# Patient Record
Sex: Female | Born: 1940 | Race: Black or African American | Hispanic: No | Smoking: Never smoker
Health system: Southern US, Community
[De-identification: ages and names within clinical notes are randomized; demographics above are authoritative.]

## PROBLEM LIST (undated history)

## (undated) DIAGNOSIS — Z87898 Personal history of other specified conditions: Secondary | ICD-10-CM

## (undated) DIAGNOSIS — I10 Essential (primary) hypertension: Secondary | ICD-10-CM

## (undated) DIAGNOSIS — N183 Chronic kidney disease, stage 3 unspecified: Secondary | ICD-10-CM

## (undated) DIAGNOSIS — E785 Hyperlipidemia, unspecified: Secondary | ICD-10-CM

## (undated) DIAGNOSIS — Z8719 Personal history of other diseases of the digestive system: Secondary | ICD-10-CM

## (undated) DIAGNOSIS — Z860100 Personal history of colon polyps, unspecified: Secondary | ICD-10-CM

## (undated) DIAGNOSIS — Z9989 Dependence on other enabling machines and devices: Secondary | ICD-10-CM

## (undated) DIAGNOSIS — Z86718 Personal history of other venous thrombosis and embolism: Secondary | ICD-10-CM

## (undated) DIAGNOSIS — N289 Disorder of kidney and ureter, unspecified: Secondary | ICD-10-CM

## (undated) DIAGNOSIS — I5032 Chronic diastolic (congestive) heart failure: Secondary | ICD-10-CM

## (undated) DIAGNOSIS — N950 Postmenopausal bleeding: Secondary | ICD-10-CM

## (undated) DIAGNOSIS — M130 Polyarthritis, unspecified: Secondary | ICD-10-CM

## (undated) DIAGNOSIS — G4733 Obstructive sleep apnea (adult) (pediatric): Secondary | ICD-10-CM

## (undated) DIAGNOSIS — K219 Gastro-esophageal reflux disease without esophagitis: Secondary | ICD-10-CM

## (undated) DIAGNOSIS — D509 Iron deficiency anemia, unspecified: Secondary | ICD-10-CM

## (undated) DIAGNOSIS — K639 Disease of intestine, unspecified: Secondary | ICD-10-CM

## (undated) DIAGNOSIS — R51 Headache: Secondary | ICD-10-CM

## (undated) DIAGNOSIS — K5792 Diverticulitis of intestine, part unspecified, without perforation or abscess without bleeding: Secondary | ICD-10-CM

## (undated) DIAGNOSIS — I2510 Atherosclerotic heart disease of native coronary artery without angina pectoris: Secondary | ICD-10-CM

## (undated) DIAGNOSIS — D219 Benign neoplasm of connective and other soft tissue, unspecified: Secondary | ICD-10-CM

## (undated) DIAGNOSIS — G629 Polyneuropathy, unspecified: Secondary | ICD-10-CM

## (undated) DIAGNOSIS — E119 Type 2 diabetes mellitus without complications: Secondary | ICD-10-CM

## (undated) DIAGNOSIS — K632 Fistula of intestine: Secondary | ICD-10-CM

## (undated) DIAGNOSIS — G479 Sleep disorder, unspecified: Secondary | ICD-10-CM

## (undated) DIAGNOSIS — L732 Hidradenitis suppurativa: Secondary | ICD-10-CM

## (undated) HISTORY — DX: Personal history of other venous thrombosis and embolism: Z86.718

## (undated) HISTORY — PX: ENTEROCUTANEOUS FISTULA CLOSURE: SHX1510

## (undated) HISTORY — PX: ANTERIOR CERVICAL DECOMP/DISCECTOMY FUSION: SHX1161

## (undated) HISTORY — DX: Polyneuropathy, unspecified: G62.9

## (undated) HISTORY — PX: TUBAL LIGATION: SHX77

## (undated) HISTORY — PX: CARDIAC SURGERY: SHX584

## (undated) HISTORY — PX: AXILLARY HIDRADENITIS EXCISION: SUR522

## (undated) HISTORY — PX: KNEE ARTHROSCOPY: SHX127

## (undated) HISTORY — DX: Hidradenitis suppurativa: L73.2

## (undated) HISTORY — PX: BACK SURGERY: SHX140

## (undated) HISTORY — PX: CERVICAL LAMINECTOMY: SHX94

## (undated) HISTORY — PX: OTHER SURGICAL HISTORY: SHX169

## (undated) HISTORY — DX: Benign neoplasm of connective and other soft tissue, unspecified: D21.9

## (undated) HISTORY — DX: Atherosclerotic heart disease of native coronary artery without angina pectoris: I25.10

## (undated) HISTORY — PX: CHOLECYSTECTOMY: SHX55

## (undated) HISTORY — DX: Gastro-esophageal reflux disease without esophagitis: K21.9

## (undated) HISTORY — DX: Hyperlipidemia, unspecified: E78.5

## (undated) HISTORY — PX: CORONARY ANGIOPLASTY WITH STENT PLACEMENT: SHX49

## (undated) HISTORY — DX: Polyarthritis, unspecified: M13.0

## (undated) HISTORY — DX: Personal history of colonic polyps: Z86.010

## (undated) HISTORY — DX: Iron deficiency anemia, unspecified: D50.9

## (undated) HISTORY — PX: HERNIA REPAIR: SHX51

## (undated) HISTORY — DX: Personal history of colon polyps, unspecified: Z86.0100

## (undated) HISTORY — DX: Chronic diastolic (congestive) heart failure: I50.32

## (undated) HISTORY — PX: UMBILICAL HERNIA REPAIR: SHX196

---

## 2000-02-18 ENCOUNTER — Emergency Department (HOSPITAL_COMMUNITY): Admission: EM | Admit: 2000-02-18 | Discharge: 2000-02-18 | Payer: Self-pay | Admitting: Emergency Medicine

## 2000-07-27 ENCOUNTER — Encounter: Payer: Self-pay | Admitting: Orthopedic Surgery

## 2000-08-01 ENCOUNTER — Ambulatory Visit (HOSPITAL_COMMUNITY): Admission: RE | Admit: 2000-08-01 | Discharge: 2000-08-01 | Payer: Self-pay | Admitting: Orthopedic Surgery

## 2000-09-01 ENCOUNTER — Encounter: Admission: RE | Admit: 2000-09-01 | Discharge: 2000-09-20 | Payer: Self-pay | Admitting: Orthopedic Surgery

## 2001-02-09 ENCOUNTER — Emergency Department (HOSPITAL_COMMUNITY): Admission: EM | Admit: 2001-02-09 | Discharge: 2001-02-09 | Payer: Self-pay | Admitting: Emergency Medicine

## 2001-02-09 ENCOUNTER — Encounter: Payer: Self-pay | Admitting: Emergency Medicine

## 2002-07-29 ENCOUNTER — Ambulatory Visit (HOSPITAL_COMMUNITY): Admission: RE | Admit: 2002-07-29 | Discharge: 2002-07-29 | Payer: Self-pay | Admitting: Gastroenterology

## 2002-08-15 ENCOUNTER — Inpatient Hospital Stay (HOSPITAL_COMMUNITY): Admission: EM | Admit: 2002-08-15 | Discharge: 2002-08-30 | Payer: Self-pay | Admitting: Emergency Medicine

## 2002-08-15 ENCOUNTER — Encounter: Payer: Self-pay | Admitting: Surgery

## 2002-08-16 ENCOUNTER — Encounter: Payer: Self-pay | Admitting: General Surgery

## 2002-08-22 ENCOUNTER — Encounter: Payer: Self-pay | Admitting: Surgery

## 2002-08-22 ENCOUNTER — Encounter (INDEPENDENT_AMBULATORY_CARE_PROVIDER_SITE_OTHER): Payer: Self-pay | Admitting: Specialist

## 2002-09-12 HISTORY — PX: COLECTOMY: SHX59

## 2003-05-23 ENCOUNTER — Encounter: Payer: Self-pay | Admitting: Emergency Medicine

## 2003-05-23 ENCOUNTER — Emergency Department (HOSPITAL_COMMUNITY): Admission: EM | Admit: 2003-05-23 | Discharge: 2003-05-23 | Payer: Self-pay | Admitting: Emergency Medicine

## 2004-02-01 ENCOUNTER — Encounter: Payer: Self-pay | Admitting: Internal Medicine

## 2004-02-23 ENCOUNTER — Emergency Department (HOSPITAL_COMMUNITY): Admission: EM | Admit: 2004-02-23 | Discharge: 2004-02-23 | Payer: Self-pay | Admitting: Emergency Medicine

## 2004-03-09 ENCOUNTER — Encounter: Admission: RE | Admit: 2004-03-09 | Discharge: 2004-03-09 | Payer: Self-pay | Admitting: Internal Medicine

## 2004-03-19 ENCOUNTER — Encounter: Admission: RE | Admit: 2004-03-19 | Discharge: 2004-03-19 | Payer: Self-pay | Admitting: Internal Medicine

## 2004-04-06 ENCOUNTER — Inpatient Hospital Stay (HOSPITAL_BASED_OUTPATIENT_CLINIC_OR_DEPARTMENT_OTHER): Admission: RE | Admit: 2004-04-06 | Discharge: 2004-04-06 | Payer: Self-pay | Admitting: Cardiology

## 2004-05-13 ENCOUNTER — Ambulatory Visit: Payer: Self-pay | Admitting: Internal Medicine

## 2004-06-28 ENCOUNTER — Ambulatory Visit: Payer: Self-pay | Admitting: Internal Medicine

## 2004-06-30 ENCOUNTER — Ambulatory Visit (HOSPITAL_COMMUNITY): Admission: RE | Admit: 2004-06-30 | Discharge: 2004-06-30 | Payer: Self-pay | Admitting: Internal Medicine

## 2004-07-08 ENCOUNTER — Ambulatory Visit: Payer: Self-pay | Admitting: Internal Medicine

## 2005-01-09 ENCOUNTER — Emergency Department (HOSPITAL_COMMUNITY): Admission: EM | Admit: 2005-01-09 | Discharge: 2005-01-10 | Payer: Self-pay | Admitting: Family Medicine

## 2005-01-11 ENCOUNTER — Ambulatory Visit: Payer: Self-pay | Admitting: Internal Medicine

## 2005-01-20 ENCOUNTER — Ambulatory Visit: Payer: Self-pay | Admitting: Internal Medicine

## 2005-01-27 ENCOUNTER — Ambulatory Visit (HOSPITAL_BASED_OUTPATIENT_CLINIC_OR_DEPARTMENT_OTHER): Admission: RE | Admit: 2005-01-27 | Discharge: 2005-01-27 | Payer: Self-pay | Admitting: Internal Medicine

## 2005-01-31 ENCOUNTER — Ambulatory Visit: Payer: Self-pay | Admitting: Internal Medicine

## 2005-02-03 ENCOUNTER — Ambulatory Visit: Payer: Self-pay | Admitting: Internal Medicine

## 2005-04-07 ENCOUNTER — Encounter: Payer: Self-pay | Admitting: Internal Medicine

## 2005-04-07 ENCOUNTER — Ambulatory Visit (HOSPITAL_BASED_OUTPATIENT_CLINIC_OR_DEPARTMENT_OTHER): Admission: RE | Admit: 2005-04-07 | Discharge: 2005-04-07 | Payer: Self-pay | Admitting: *Deleted

## 2005-04-10 ENCOUNTER — Ambulatory Visit: Payer: Self-pay | Admitting: Internal Medicine

## 2005-04-24 ENCOUNTER — Emergency Department (HOSPITAL_COMMUNITY): Admission: EM | Admit: 2005-04-24 | Discharge: 2005-04-24 | Payer: Self-pay | Admitting: Family Medicine

## 2005-04-27 ENCOUNTER — Ambulatory Visit: Payer: Self-pay | Admitting: Internal Medicine

## 2005-05-25 ENCOUNTER — Ambulatory Visit: Payer: Self-pay | Admitting: Internal Medicine

## 2005-07-05 ENCOUNTER — Ambulatory Visit: Payer: Self-pay | Admitting: Internal Medicine

## 2005-07-05 ENCOUNTER — Inpatient Hospital Stay (HOSPITAL_COMMUNITY): Admission: EM | Admit: 2005-07-05 | Discharge: 2005-07-08 | Payer: Self-pay | Admitting: Emergency Medicine

## 2005-07-06 ENCOUNTER — Ambulatory Visit: Payer: Self-pay | Admitting: Cardiology

## 2005-07-06 ENCOUNTER — Encounter: Payer: Self-pay | Admitting: Cardiology

## 2006-03-27 ENCOUNTER — Encounter: Payer: Self-pay | Admitting: Internal Medicine

## 2006-04-03 ENCOUNTER — Encounter: Admission: RE | Admit: 2006-04-03 | Discharge: 2006-04-03 | Payer: Self-pay | Admitting: Gastroenterology

## 2006-04-18 ENCOUNTER — Inpatient Hospital Stay (HOSPITAL_COMMUNITY): Admission: RE | Admit: 2006-04-18 | Discharge: 2006-04-25 | Payer: Self-pay | Admitting: *Deleted

## 2006-04-21 ENCOUNTER — Encounter: Payer: Self-pay | Admitting: Vascular Surgery

## 2006-08-03 ENCOUNTER — Emergency Department (HOSPITAL_COMMUNITY): Admission: EM | Admit: 2006-08-03 | Discharge: 2006-08-04 | Payer: Self-pay | Admitting: Emergency Medicine

## 2006-08-15 DIAGNOSIS — Z91199 Patient's noncompliance with other medical treatment and regimen due to unspecified reason: Secondary | ICD-10-CM | POA: Insufficient documentation

## 2006-08-15 DIAGNOSIS — Z860100 Personal history of colon polyps, unspecified: Secondary | ICD-10-CM | POA: Insufficient documentation

## 2006-08-15 DIAGNOSIS — N183 Chronic kidney disease, stage 3 unspecified: Secondary | ICD-10-CM | POA: Insufficient documentation

## 2006-08-15 DIAGNOSIS — Z9089 Acquired absence of other organs: Secondary | ICD-10-CM

## 2006-08-15 DIAGNOSIS — L749 Eccrine sweat disorder, unspecified: Secondary | ICD-10-CM | POA: Insufficient documentation

## 2006-08-15 DIAGNOSIS — M109 Gout, unspecified: Secondary | ICD-10-CM | POA: Insufficient documentation

## 2006-08-15 DIAGNOSIS — K209 Esophagitis, unspecified without bleeding: Secondary | ICD-10-CM | POA: Insufficient documentation

## 2006-08-15 DIAGNOSIS — R109 Unspecified abdominal pain: Secondary | ICD-10-CM | POA: Insufficient documentation

## 2006-08-15 DIAGNOSIS — Z9889 Other specified postprocedural states: Secondary | ICD-10-CM

## 2006-08-15 DIAGNOSIS — I10 Essential (primary) hypertension: Secondary | ICD-10-CM

## 2006-08-15 DIAGNOSIS — Z789 Other specified health status: Secondary | ICD-10-CM | POA: Insufficient documentation

## 2006-08-15 DIAGNOSIS — Z9861 Coronary angioplasty status: Secondary | ICD-10-CM

## 2006-08-15 DIAGNOSIS — I2510 Atherosclerotic heart disease of native coronary artery without angina pectoris: Secondary | ICD-10-CM

## 2006-08-15 DIAGNOSIS — Z9119 Patient's noncompliance with other medical treatment and regimen: Secondary | ICD-10-CM

## 2006-08-15 DIAGNOSIS — Z86718 Personal history of other venous thrombosis and embolism: Secondary | ICD-10-CM | POA: Insufficient documentation

## 2006-08-15 DIAGNOSIS — E785 Hyperlipidemia, unspecified: Secondary | ICD-10-CM

## 2006-08-20 DIAGNOSIS — K219 Gastro-esophageal reflux disease without esophagitis: Secondary | ICD-10-CM | POA: Insufficient documentation

## 2007-12-18 ENCOUNTER — Encounter: Admission: RE | Admit: 2007-12-18 | Discharge: 2007-12-18 | Payer: Self-pay | Admitting: *Deleted

## 2007-12-20 ENCOUNTER — Ambulatory Visit (HOSPITAL_COMMUNITY): Admission: RE | Admit: 2007-12-20 | Discharge: 2007-12-21 | Payer: Self-pay | Admitting: *Deleted

## 2008-01-06 ENCOUNTER — Encounter: Payer: Self-pay | Admitting: Internal Medicine

## 2008-04-21 ENCOUNTER — Emergency Department (HOSPITAL_COMMUNITY): Admission: EM | Admit: 2008-04-21 | Discharge: 2008-04-21 | Payer: Self-pay | Admitting: Family Medicine

## 2008-04-22 ENCOUNTER — Inpatient Hospital Stay (HOSPITAL_COMMUNITY): Admission: EM | Admit: 2008-04-22 | Discharge: 2008-04-24 | Payer: Self-pay | Admitting: Emergency Medicine

## 2008-04-23 ENCOUNTER — Ambulatory Visit: Payer: Self-pay | Admitting: Vascular Surgery

## 2008-04-23 ENCOUNTER — Encounter (INDEPENDENT_AMBULATORY_CARE_PROVIDER_SITE_OTHER): Payer: Self-pay | Admitting: Cardiovascular Disease

## 2008-05-12 ENCOUNTER — Encounter: Payer: Self-pay | Admitting: Internal Medicine

## 2008-05-23 ENCOUNTER — Inpatient Hospital Stay (HOSPITAL_COMMUNITY): Admission: EM | Admit: 2008-05-23 | Discharge: 2008-05-28 | Payer: Self-pay | Admitting: Family Medicine

## 2008-05-30 ENCOUNTER — Encounter (INDEPENDENT_AMBULATORY_CARE_PROVIDER_SITE_OTHER): Payer: Self-pay | Admitting: Surgery

## 2008-05-30 ENCOUNTER — Inpatient Hospital Stay (HOSPITAL_COMMUNITY): Admission: EM | Admit: 2008-05-30 | Discharge: 2008-06-03 | Payer: Self-pay | Admitting: Emergency Medicine

## 2009-02-25 ENCOUNTER — Emergency Department (HOSPITAL_COMMUNITY): Admission: EM | Admit: 2009-02-25 | Discharge: 2009-02-25 | Payer: Self-pay | Admitting: Emergency Medicine

## 2009-03-05 LAB — HM COLONOSCOPY

## 2009-04-01 ENCOUNTER — Encounter: Payer: Self-pay | Admitting: Internal Medicine

## 2009-04-02 ENCOUNTER — Ambulatory Visit (HOSPITAL_COMMUNITY): Admission: RE | Admit: 2009-04-02 | Discharge: 2009-04-02 | Payer: Self-pay | Admitting: Infectious Diseases

## 2009-04-02 ENCOUNTER — Ambulatory Visit: Payer: Self-pay | Admitting: Internal Medicine

## 2009-04-02 DIAGNOSIS — G4733 Obstructive sleep apnea (adult) (pediatric): Secondary | ICD-10-CM

## 2009-04-02 DIAGNOSIS — M79609 Pain in unspecified limb: Secondary | ICD-10-CM

## 2009-04-03 ENCOUNTER — Ambulatory Visit: Payer: Self-pay | Admitting: Infectious Diseases

## 2009-04-03 ENCOUNTER — Encounter: Payer: Self-pay | Admitting: Internal Medicine

## 2009-04-03 LAB — CONVERTED CEMR LAB
Alkaline Phosphatase: 75 units/L (ref 39–117)
CO2: 17 meq/L — ABNORMAL LOW (ref 19–32)
Cholesterol: 164 mg/dL (ref 0–200)
Creatinine, Ser: 1.79 mg/dL — ABNORMAL HIGH (ref 0.40–1.20)
Creatinine, Urine: 101.5 mg/dL
Glucose, Bld: 76 mg/dL (ref 70–99)
HDL: 45 mg/dL (ref >39)
Hemoglobin: 10.3 g/dL — ABNORMAL LOW (ref 12.0–15.0)
LDL Cholesterol: 101 mg/dL — ABNORMAL HIGH (ref 0–99)
Microalb, Ur: 0.5 mg/dL (ref 0.00–1.89)
RBC: 4.73 M/uL (ref 3.87–5.11)
Sodium: 141 meq/L (ref 135–145)
Total Bilirubin: 0.2 mg/dL — ABNORMAL LOW (ref 0.3–1.2)
Total CHOL/HDL Ratio: 3.6
Total Protein: 7.3 g/dL (ref 6.0–8.3)
Triglycerides: 92 mg/dL (ref <150)
VLDL: 18 mg/dL (ref 0–40)
WBC: 6.4 10*3/uL (ref 4.0–10.5)

## 2009-04-09 ENCOUNTER — Ambulatory Visit: Payer: Self-pay | Admitting: Internal Medicine

## 2009-04-20 ENCOUNTER — Telehealth (INDEPENDENT_AMBULATORY_CARE_PROVIDER_SITE_OTHER): Payer: Self-pay | Admitting: Internal Medicine

## 2009-04-27 ENCOUNTER — Encounter: Payer: Self-pay | Admitting: Internal Medicine

## 2009-05-05 ENCOUNTER — Ambulatory Visit: Payer: Self-pay | Admitting: Internal Medicine

## 2009-05-05 DIAGNOSIS — D649 Anemia, unspecified: Secondary | ICD-10-CM | POA: Insufficient documentation

## 2009-05-07 DIAGNOSIS — E872 Acidosis: Secondary | ICD-10-CM | POA: Insufficient documentation

## 2009-05-07 DIAGNOSIS — M2550 Pain in unspecified joint: Secondary | ICD-10-CM

## 2009-05-07 LAB — CONVERTED CEMR LAB
Albumin: 4.4 g/dL (ref 3.5–5.2)
BUN: 23 mg/dL (ref 6–23)
Calcium: 10.2 mg/dL (ref 8.4–10.5)
Chloride: 110 meq/L (ref 96–112)
Creatinine, Ser: 1.34 mg/dL — ABNORMAL HIGH (ref 0.40–1.20)
HCT: 34.9 % — ABNORMAL LOW (ref 36.0–46.0)
Iron: 51 ug/dL (ref 42–145)
MCV: 71.6 fL — ABNORMAL LOW (ref >=78.0)
Phosphorus: 3 mg/dL (ref 2.3–4.6)
Platelets: 309 10*3/uL (ref 150–400)
RBC: 4.87 M/uL (ref 3.87–5.11)
Rhuematoid fact SerPl-aCnc: 22 intl units/mL — ABNORMAL HIGH (ref 0–20)
Saturation Ratios: 17 % — ABNORMAL LOW (ref 20–55)
WBC: 6.8 10*3/uL (ref 4.0–10.5)

## 2009-05-13 ENCOUNTER — Encounter: Payer: Self-pay | Admitting: Internal Medicine

## 2009-05-14 ENCOUNTER — Emergency Department (HOSPITAL_COMMUNITY): Admission: EM | Admit: 2009-05-14 | Discharge: 2009-05-14 | Payer: Self-pay | Admitting: Emergency Medicine

## 2009-06-09 ENCOUNTER — Telehealth (INDEPENDENT_AMBULATORY_CARE_PROVIDER_SITE_OTHER): Payer: Self-pay | Admitting: *Deleted

## 2009-06-12 ENCOUNTER — Encounter: Payer: Self-pay | Admitting: Internal Medicine

## 2009-07-07 ENCOUNTER — Ambulatory Visit: Payer: Self-pay | Admitting: Internal Medicine

## 2009-07-07 DIAGNOSIS — G589 Mononeuropathy, unspecified: Secondary | ICD-10-CM | POA: Insufficient documentation

## 2009-07-07 LAB — CONVERTED CEMR LAB
Hgb A1c MFr Bld: 5.1 %
TSH: 1.709 microintl units/mL (ref 0.350–4.5)

## 2009-07-08 ENCOUNTER — Telehealth: Payer: Self-pay | Admitting: *Deleted

## 2009-07-21 ENCOUNTER — Emergency Department (HOSPITAL_COMMUNITY): Admission: EM | Admit: 2009-07-21 | Discharge: 2009-07-21 | Payer: Self-pay | Admitting: Family Medicine

## 2009-07-28 ENCOUNTER — Encounter: Payer: Self-pay | Admitting: Internal Medicine

## 2009-08-10 ENCOUNTER — Ambulatory Visit: Payer: Self-pay | Admitting: Internal Medicine

## 2009-08-18 ENCOUNTER — Encounter (INDEPENDENT_AMBULATORY_CARE_PROVIDER_SITE_OTHER): Payer: Self-pay | Admitting: Dermatology

## 2009-08-18 ENCOUNTER — Encounter: Payer: Self-pay | Admitting: Internal Medicine

## 2009-09-02 ENCOUNTER — Encounter (INDEPENDENT_AMBULATORY_CARE_PROVIDER_SITE_OTHER): Payer: Self-pay | Admitting: Dermatology

## 2009-09-24 ENCOUNTER — Inpatient Hospital Stay (HOSPITAL_COMMUNITY): Admission: EM | Admit: 2009-09-24 | Discharge: 2009-09-26 | Payer: Self-pay | Admitting: Emergency Medicine

## 2009-09-24 ENCOUNTER — Encounter (INDEPENDENT_AMBULATORY_CARE_PROVIDER_SITE_OTHER): Payer: Self-pay | Admitting: Internal Medicine

## 2009-09-24 ENCOUNTER — Ambulatory Visit: Payer: Self-pay | Admitting: Internal Medicine

## 2009-09-26 ENCOUNTER — Encounter (INDEPENDENT_AMBULATORY_CARE_PROVIDER_SITE_OTHER): Payer: Self-pay | Admitting: Internal Medicine

## 2009-09-26 DIAGNOSIS — K922 Gastrointestinal hemorrhage, unspecified: Secondary | ICD-10-CM | POA: Insufficient documentation

## 2009-10-25 ENCOUNTER — Encounter: Payer: Self-pay | Admitting: Internal Medicine

## 2009-10-26 ENCOUNTER — Encounter: Payer: Self-pay | Admitting: Internal Medicine

## 2009-11-04 ENCOUNTER — Encounter: Payer: Self-pay | Admitting: Internal Medicine

## 2010-01-19 ENCOUNTER — Ambulatory Visit: Payer: Self-pay | Admitting: Infectious Disease

## 2010-01-19 ENCOUNTER — Encounter: Payer: Self-pay | Admitting: Internal Medicine

## 2010-01-19 ENCOUNTER — Inpatient Hospital Stay (HOSPITAL_COMMUNITY): Admission: AD | Admit: 2010-01-19 | Discharge: 2010-01-20 | Payer: Self-pay | Admitting: Internal Medicine

## 2010-01-19 ENCOUNTER — Ambulatory Visit: Payer: Self-pay | Admitting: Internal Medicine

## 2010-01-19 DIAGNOSIS — R42 Dizziness and giddiness: Secondary | ICD-10-CM | POA: Insufficient documentation

## 2010-01-19 DIAGNOSIS — IMO0002 Reserved for concepts with insufficient information to code with codable children: Secondary | ICD-10-CM

## 2010-01-19 DIAGNOSIS — R110 Nausea: Secondary | ICD-10-CM

## 2010-01-19 DIAGNOSIS — R079 Chest pain, unspecified: Secondary | ICD-10-CM | POA: Insufficient documentation

## 2010-01-19 DIAGNOSIS — N950 Postmenopausal bleeding: Secondary | ICD-10-CM

## 2010-01-20 ENCOUNTER — Encounter (INDEPENDENT_AMBULATORY_CARE_PROVIDER_SITE_OTHER): Payer: Self-pay | Admitting: Internal Medicine

## 2010-01-21 ENCOUNTER — Ambulatory Visit: Payer: Self-pay | Admitting: Obstetrics and Gynecology

## 2010-01-21 LAB — CONVERTED CEMR LAB
Hemoglobin: 11 g/dL — ABNORMAL LOW (ref 12.0–15.0)
INR: 1.06 (ref <1.50)
Platelets: 284 10*3/uL (ref 150–400)
RDW: 14.1 % (ref 11.5–15.5)
WBC: 9.7 10*3/uL (ref 4.0–10.5)
aPTT: 28 s (ref 24–37)

## 2010-01-26 ENCOUNTER — Ambulatory Visit (HOSPITAL_COMMUNITY): Admission: RE | Admit: 2010-01-26 | Discharge: 2010-01-26 | Payer: Self-pay | Admitting: Family Medicine

## 2010-02-24 ENCOUNTER — Other Ambulatory Visit: Admission: RE | Admit: 2010-02-24 | Discharge: 2010-02-24 | Payer: Self-pay | Admitting: Obstetrics & Gynecology

## 2010-02-24 ENCOUNTER — Ambulatory Visit: Payer: Self-pay | Admitting: Obstetrics & Gynecology

## 2010-02-24 LAB — CONVERTED CEMR LAB: TSH: 1.764 microintl units/mL (ref 0.350–4.500)

## 2010-03-04 ENCOUNTER — Encounter: Payer: Self-pay | Admitting: Internal Medicine

## 2010-03-31 ENCOUNTER — Ambulatory Visit: Payer: Self-pay | Admitting: Obstetrics and Gynecology

## 2010-04-18 ENCOUNTER — Emergency Department (HOSPITAL_COMMUNITY): Admission: EM | Admit: 2010-04-18 | Discharge: 2010-04-18 | Payer: Self-pay | Admitting: Emergency Medicine

## 2010-04-27 ENCOUNTER — Ambulatory Visit: Payer: Self-pay | Admitting: Obstetrics & Gynecology

## 2010-04-27 ENCOUNTER — Ambulatory Visit (HOSPITAL_COMMUNITY): Admission: RE | Admit: 2010-04-27 | Discharge: 2010-04-27 | Payer: Self-pay | Admitting: Obstetrics & Gynecology

## 2010-05-21 ENCOUNTER — Ambulatory Visit: Payer: Self-pay | Admitting: Obstetrics & Gynecology

## 2010-05-21 LAB — CONVERTED CEMR LAB: Yeast Wet Prep HPF POC: NONE SEEN

## 2010-10-12 NOTE — Miscellaneous (Signed)
Summary: hosp admission  INTERNAL MEDICINE ADMISSION HISTORY AND PHYSICAL PCP: Dr. Denton Meek  Attending: Dr. Orvan Falconer 1st contact: Dr. Arvilla Market [phone] 2nd contact: Dr. Polly Cobia [phone]  CC: Rectal bleeding  HPI:  Marissa [name] is a 70 yo F with a PMH significant for diverticulosis/itis c/b perforation requiring L hemicolectomy and CAD on Plavix following stent placement who presents with abd pain and rectal bleeding. She says she has had abd pain, worst just below the umbilicus, and nausea for the past week and noticed a small amount of dark red blood in her stool tonight at 11 pm. The abd pain is intermittent, with the nausea worsening after eating, and she says her symptoms are consistent with her past diverticulitis flares. However, her abd pain and bleeding are not as severe this time. She denies any fevers, chills, vomiting, diarrhea, dizziness, or any other concerning symptom.   ALLERGIES: NKDA  PAST MEDICAL HISTORY: Colonic polyps, hx of DVT, hx of Hyperlipidemia Hypertension Cardiomegaly Esophagitis Diverticulosis GERD Colonic polyp CAD s/p stent placement in 8/07, 4/09 Renal insufficiency with a baseline Cr approx 1.3  MEDICATIONS: ASPIRIN 81 MG TBEC (ASPIRIN) once daily PLAVIX 75 MG TABS (CLOPIDOGREL BISULFATE) Take one tablet by mouth daily NITROGLYCERIN 0.4 MG SUBL (NITROGLYCERIN) As needed for chest pain METOPROLOL TARTRATE 100 MG TABS (METOPROLOL TARTRATE) Take 1 tablet by mouth once a day MEDROL (PAK) 4 MG TABS (METHYLPREDNISOLONE) Use as directed for foot pain/gout CRESTOR 20 MG TABS (ROSUVASTATIN CALCIUM) Take 1 tab by mouth at bedtime GABAPENTIN 100 MG CAPS (GABAPENTIN) Take 1 tablet by mouth three times a day  SOCIAL HISTORY: 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  ROS: as per HPI VITALS: T: 97.9  P: 73  BP: 147/75  R: 20  O2SAT: 100%  ON: RA PHYSICAL EXAM: General:  alert, well-developed, and cooperative to examination.   Head:  normocephalic and atraumatic.   Eyes:  vision grossly intact, pupils equal, pupils round, pupils reactive to light, no injection and anicteric.   Mouth:  pharynx pink and moist, no erythema, and no exudates.   Lungs:  normal respiratory effort, no accessory muscle use, normal breath sounds, no crackles, and no wheezes. Heart:  normal rate, regular rhythm, no murmur, no gallop, and no rub.   Abdomen:  soft, normal bowel sounds, no distention; tender below the umbilicus, no rebound tenderness Msk:  no joint swelling, no joint warmth, and no redness over joints.   Extremities:  1+ pitting pedal edema Neurologic:  alert & oriented X3, nonfocal  Skin:  turgor normal and no rashes.   Psych:  Oriented X3, memory intact for recent and remote, normally interactive, good eye contact, not anxious appearing, and not depressed appearing.  LABS: TCO2                                     27                0-100            mmol/L  Ionized Calcium                          1.22  1.12-1.32        mmol/L  Hemoglobin (HGB)                         12.9              12.0-15.0        g/dL  Hematocrit (HCT)                         38.0              36.0-46.0        %  Sodium (NA)                              140               135-145          mEq/L  Potassium (K)                            5.6        h      3.5-5.1          mEq/L  Chloride                                 108               96-112           mEq/L  Glucose                                  90                70-99            mg/dL  BUN                                      31         h      6-23             mg/dL  Creatinine                               1.2               0.4-1.2          mg/dL  WBC                                      7.9                4.0-10.5         K/uL  RBC                                      5.07              3.87-5.11        MIL/uL  Hemoglobin (HGB)                         11.1       l      12.0-15.0        g/dL  Hematocrit (HCT)                         35.3       l      36.0-46.0        %  MCV                                      69.8       l      78.0-100.0       fL  MCHC                                     31.5              30.0-36.0        g/dL  RDW                                      15.1              11.5-15.5        %  Platelet Count (PLT)                     251               150-400          K/uL  Protime ( Prothrombin Time)              12.4              11.6-15.2        seconds  INR                                      0.93              0.00-1.49 PTT(a-Partial Thromboplastn Time)        28                24-37            seconds  Occult Blood, Fecal                      POSITIVE   ASSESSMENT AND PLAN: 70 yo F with h/o diverticular bleeds who presents with abd pain, nausea, and rectal bleeding that "feels consistent with her previous diverticular flares." Thus, diverticulosis/itis is the likely cause of her current complaints.  1. Rectal Bleeding, Likely 2/2 Diverticulosis - will admit to regular floor and hold off on GI consult until the AM as the patient is hemodynamically stable. She does not have fevers or an elevated WBC to suggest diverticulitis. Will recheck a CBC tomorrow afternoon. Will also check orthostatic vital signs and give IVF. Will continue Plavix but hold aspirin for now given her bleeding.  2. HTN- cont home  meds  3. CAD- cont Plavix, hold aspirin given her GI bleeding  4. HLD- cont statin  5. PPx- protonix, SCDs

## 2010-10-12 NOTE — Miscellaneous (Signed)
Summary: MEDICAL [facility] /SURGICAL   Imported By: Margie Billet 01/25/2010 15:27:33  _____________________________________________________________________  External Attachment:    Type:   Image     Comment:   External Document

## 2010-10-12 NOTE — Consult Note (Signed)
Summary: [facility]  [facility]   Imported By: Florinda Marker 09/21/2009 14:33:01  _____________________________________________________________________  External Attachment:    Type:   Image     Comment:   External Document

## 2010-10-12 NOTE — Letter (Signed)
Summary: [facility]: Heart Failure  [facility]: Heart Failure   Imported By: Florinda Marker 11/02/2009 14:14:34  _____________________________________________________________________  External Attachment:    Type:   Image     Comment:   External Document

## 2010-10-12 NOTE — Letter (Signed)
Summary: [facility]: Heart Failure  [facility]: Heart Failure   Imported By: Florinda Marker 10/27/2009 13:50:31  _____________________________________________________________________  External Attachment:    Type:   Image     Comment:   External Document

## 2010-10-12 NOTE — Assessment & Plan Note (Signed)
Summary: Hospital Admission  INTERNAL MEDICINE ADMISSION HISTORY AND PHYSICAL  PCP: Dr. [name]  CC: Chest pain and vaginal bleeding  HPI: Mrs. Mitchener is a postmenopausal 70 yo AAF with a PMH of CAD with MI in 2010(?) s/p multiple stents, a long history of lower GI bleeding, and admission to hospital in January of 2011 with hemorrhagic gastritis, lower GI bleeding and fibroid uterus, and hindranitis supportiva s/p sweat gland removal.  She presented in clinic today with chief complaints of axillary drainage from a boil and vaginal and rectal bleeding X2 weeks.  She also reported intermittant chest pain over the past 2 weeks.  Her symptoms first began about 2 weeks ago when she noticed sharp epigastric pain, watery diarrhea, and blood in her stool and anus.  She had a few bloody bowel movements, afterwhich the diarrhea persisted but the she ceased to notice the blood.  A few days later, she began to have abdominal cramping and vaginal bleeding with a flow "like a period," soaking several pads a day.  The heavy flow lasted 4 days, and for the past week she has only had spotting.  She stopped her plavix after a few days of active bleeding.  About a week ago, she experienced chest pain when at rest that felt similar to an MI that she had last year.  The pain was substernal, with pressure and tingling radiating from her chest to her jaw, neck, and down her left arm.  She experienced dyspnea, and both the pain and SOB were relieved with sitting up in bed.  Last night, she felt intermittant substernal chest pain without radiation.  About 2 weeks ago, she noticed drainage that she could not describe from a boil in her axilla with accompanying tingling and pain down her arm.  The tingling and arm pain resolved after I&D of the abscess in clinic.   Patient is postmenopausal.  Her menstrual debut was at age 68 with subsequent dysfunctional uterine bleeding of menorraghia and several months between periods.   She carried 6 pregnancies without difficulty, though she reports that she had regular periods during her pregnancies.  She reports that she was induced into menopause with a shot when she was 54 and has had no vaginal bleeding since that time.  She denies hormonal therapy use.  Patient reports subjective fevers and chills at night.  She has had anorexia, which she contributes partially to nausea (no vomiting) and to dysphagia (chronic).  She has had low energy, increased sleep requirements, dull headaches, lightheadedness with vertigo upon changing position, and numbness in her left leg over the past 2 weeks.  She reports chronic bilateral tinnitus and chronic diarrhea.  She denies changes in vision, weight loss, decreased hearing, or syncope.  She denies current chest pain, shortness of breath, or dizziness.  Walking is limited by chronic foot and ankle pain, but she reports no shortness of breath on exertion.  ALLERGIES: NKDA  PAST MEDICAL HISTORY: Lower GI bleeding, flexible colonoscopy in 09/2009, questionable small anal fissure  Hemorrhagic gastritis seen on endoscopy in 09/2009 Fibroid uterus, found on CT in 09/2009 Schatzki ring Hiatal hernia, likely cause of dysphagia Diverticulitis and colonic polyps with multiple episodes of bleeding requiring hospitalization CAD s/p multiple stents, last cath in 2009.  Echo in 2006 showed EF >60%. Hx of DVT, prior to 2007 Hyperlipidemia Hypertension GERD Renal insufficiency with a baseline Cr approx 1.3 Hidradenitis suppurativa s/p axilary sweat gland removal Chronic foot pain Polyarthritis Microcytic anemia, thought to  be thalassemia  PAST SURGICAL HISTORY: Cervical fusion C4-5 Cervical laminectomy Tonsillectomy Ventral hernia repair Axillary sweat gland removal Right knee arthroscopy Colectomy- partial Cholecystectomy Tubal ligation  MEDICATIONS: PLAVIX 75 MG TABS (CLOPIDOGREL BISULFATE) Take one tablet by mouth daily.  D/c'ed during  current bleeding episode METOPROLOL TARTRATE 100 MG TABS (METOPROLOL TARTRATE) Take 1 tablet by mouth once a day CRESTOR 20 MG TABS (ROSUVASTATIN CALCIUM) Take 1 tab by mouth at bedtime   SOCIAL HISTORY: Employment: Retired last year.  Used to work in a [employer]. 12th grade high school education Living situation: Widowed.  [id number] (1 still birth).  Lives with her daughter, son, and grandson. Substance use: denies tobacco use, but documented in chart.  Denies alcohol or recreation drugs. Insurance: Medicaid  FAMILY HISTORY Mother --DM, CAD, died in 18s from MI Sister- DM, CAD, died in 4s from MI Brother-DM, CAD, died in 23s from MI Brother-DM, living 5 Living children, alive and well No FMHx of cancers or psychiatric dz.   ROS:  As per HPI.  VITALS: T: 97.2 P: 72 BP:154/91  R: 20 O2SAT: 100% ON: RA  PHYSICAL EXAM: General:  alert, well-developed, and cooperative to examination.   Head:  normocephalic and atraumatic.   Eyes:  vision grossly intact, pupils equal, pupils round, pupils reactive to light, no injection and anicteric.  Hypervascularity of retina. Mouth:  pharynx pink and moist, no erythema, and no exudates.  Poor dentition. Neck:  supple, full ROM, no thyromegaly, JVD visible, no carotid bruits.   Lungs:  normal respiratory effort, no accessory muscle use, clear to ascultation, poor effort. Heart:  normal rate, regular rhythm, no murmur, no gallop, and no rub.   Abdomen:  soft, non-tender, normal bowel sounds, no distention, no guarding, no rebound tenderness.  Positive FOBT. Msk:  no joint swelling, no joint warmth  GU: No blood observed on external vaginal exam Pulses:  2+ DP/PT pulses bilaterally Extremities:  1+ pedal edema bilateral Neurologic:  alert & oriented X3, cranial nerves II-XII grossly intact, strength normal in all extremities, sensation intact to light touch, and walks slowly.   Skin:  turgor normal and no rashes.  Incision in left axilla  from I&D in clinic Psych:  Oriented X3, poor memory and difficulty answering concrete and abstract questions surrounding her medical history.  LABS:  Sodium (NA)                              138               135-145          mEq/L  Potassium (K)                            3.6               3.5-5.1          mEq/L  Chloride                                 106               96-112           mEq/L  CO2  28                19-32            mEq/L  Glucose                                  98                70-99            mg/dL  BUN                                      9                 6-23             mg/dL  Creatinine                               1.13              0.4-1.2          mg/dL  GFR, Est Non African American            48         l      >60              mL/min  GFR, Est African American                58         l      >60              mL/min    Oversized comment, see footnote  1  Calcium                                  9.4               8.4-10.5         mg/dL    WBC                                      8.5               4.0-10.5         K/uL  RBC                                      5.18       h      3.87-5.11        MIL/uL  Hemoglobin (HGB)                         11.3       l      12.0-15.0        g/dL  Hematocrit (HCT)                         36.1  36.0-46.0        %  MCV                                      69.8       l      78.0-100.0       fL  MCHC                                     31.3              30.0-36.0        g/dL  RDW                                      13.7              11.5-15.5        %  Platelet Count (PLT)                     SEE NOTE.         150-400          K/uL    PLATELET CLUMPS NOTED ON SMEAR, COUNT APPEARS ADEQUATE  Neutrophils, %                           86         h      43-77            %  Lymphocytes, %                           8          l      12-46            %  Monocytes, %                              4                 3-12             %  Eosinophils, %                           2                 0-5              %  Basophils, %                             0                 0-1              %  Neutrophils, Absolute                    7.3               1.7-7.7          K/uL  Lymphocytes,  Absolute                    0.7               0.7-4.0          K/uL  Monocytes, Absolute                      0.3               0.1-1.0          K/uL  Eosinophils, Absolute                    0.2               0.0-0.7          K/uL  Basophils, Absolute                      0.0               0.0-0.1          K/uL   Protime ( Prothrombin Time)              13.0              11.6-15.2        seconds  INR                                      0.99              0.00-1.49  PTT(a-Partial Thromboplastn Time)        28                24-37            seconds  Troponin I                               0.01              0.00-0.06        ng/mL Creatine Kinase, Total                   127               7-177            U/L  CK, MB                                   1.7               0.3-4.0          ng/mL  Relative Index                           1.3               0.0-2.5  Beta Natriuretic Peptide                 116.0      h      0.0-100.0        pg/mL  ASSESSMENT AND PLAN:  70 year old woman with recent chest pain and vaginal/GI bleeding.  Patient appears stable with normal vitals and non-alarming hematocrit.  Patient seems to have had stable angina with no chest pain in recent months, so her new chest pain may be unstable angina or recent MI.  In light of recent blood loss, a low circulating volume may put added strain on an already damaged heart.  However, her hemoglobin seems to be near her baseline at 11.3.  Her first set of enzymes were negative, which make an MI from last night unlikely.  CHF is unlikely without more complaints of edema or dyspnea.  She has had recurrent episodes of GI bleeding, thought to be  diverticulitis in the past, but she has never had a workup for malignancy. This is her first presentation of vaginal bleeding, which is concerning for malignancy in a postmenopausal women, particularly with the added history of lifelong dysfunctional uterine bleeding.  Subjective fevers, chills, and malaise may be attributed to her abscess, but it also could be a sign of neoplasm.  Another possibility is degenerating fibroids, as observed on CT in January.    1. Chest pain: admit to unit with telemetry.  Will cycle cardiac enzymes.  Will consult cardiologist, as her last cath was in 2009.  EKG pending.  Will check CBC and electrolytes in AM.  Will transfuse if hemoglobin drops below 10.  Will type and screen 2 units of PRC.  Will replace potassium, as it is below 4.  Blood culture X2.  Will make nitro available as needed.  2. Vaginal bleeding.  Will consult gynecology for endometrial biospy.  3. Rectal bleeding: recommend colonoscopy on outpatient basis.    4. Microcytic anemia: could be due to blood loss, but her hematocrit has not dropped.  Has had negative iron workup in past.  Consider electrophoresis for thalassemia.  5. Hypertension: continue her home med of metoprolol.  6. Hyperlipidemia: will continue home med of crestor.  7. GERD: will add protonix.  8. Axilla abscess: likely MRSA, so will start on empiric vancomycin and contact precautions until culture comes back.  9. Chronic renal insufficiency: stable, with Cr at 1.13  10. Prophylaxis: SCD, as she is actively bleeding.

## 2010-10-12 NOTE — Discharge Summary (Signed)
Summary: Hospital Discharge Update    Hospital Discharge Update:  Date of Admission: 01/19/2010 Date of Discharge: 01/20/2010  Brief Summary:  Ms. Palomarez was admitted for anginal-sounding chest pain and vaginal bleeding. She was ruled out for ACS but will be scheduled for a NM study as an outpatient. She has been having significant vaginal bleeding for the past 1 wk and some rectal bleeding 1 wk prior to that. Bimanual exam was negative for palpable masses. She will follow-up with ob/gyn on 5/12 @ 1:30 pm and with cardiology to get the NM stress test.  Other follow-up issues:  She will follow-up at [facility] on 5/12 @ 1:30 pm and with Dr. Polly Cobia on May 26 @ 10:20 am. At this f/u appt, please verify that she saw the ob/gyn and what they said and also make sure she has cardiology follow-up if she has not already seen them. Also, consider getting a repeat CBC depending on what is done at the ob/gyn's office as she was anemic during hospitalization.  Medication list changes:  Added new medication of DOXYCYCLINE HYCLATE 100 MG CAPS (DOXYCYCLINE HYCLATE) 1 tablet twice daily x 10 days - Signed Added new medication of ASPIR-LOW 81 MG TBEC (ASPIRIN) 1 tablet daily while holding plavix Removed medication of GABAPENTIN 100 MG CAPS (GABAPENTIN) Take 1 tablet by mouth three times a day Removed medication of PLAVIX 75 MG TABS (CLOPIDOGREL BISULFATE) Take one tablet by mouth daily Changed medication from NITROGLYCERIN 0.4 MG SUBL (NITROGLYCERIN) As needed for chest pain to NITROSTAT 0.4 MG SUBL (NITROGLYCERIN) place one pill under tongue every 5 mins as needed for chest pain to a max of 3 pills in 15 mins - Signed Rx of DOXYCYCLINE HYCLATE 100 MG CAPS (DOXYCYCLINE HYCLATE) 1 tablet twice daily x 10 days;  #20 x 0;  Signed;  Entered by: Silvestre Gunner MD;  Authorized by: Silvestre Gunner MD;  Method used: Electronically to [facility] [id number] [place],  [place], [place]  [address], Ph: [phone], Fax: [phone] Rx of NITROSTAT 0.4 MG SUBL (NITROGLYCERIN) place one pill under tongue every 5 mins as needed for chest pain to a max of 3 pills in 15 mins;  #30 x 3;  Signed;  Entered by: Silvestre Gunner MD;  Authorized by: Silvestre Gunner MD;  Method used: Handwritten  Discharge medications:  NITROSTAT 0.4 MG SUBL (NITROGLYCERIN) place one pill under tongue every 5 mins as needed for chest pain to a max of 3 pills in 15 mins METOPROLOL TARTRATE 100 MG TABS (METOPROLOL TARTRATE) Take 1 tablet by mouth once a day CRESTOR 20 MG TABS (ROSUVASTATIN CALCIUM) Take 1 tab by mouth at bedtime DOXYCYCLINE HYCLATE 100 MG CAPS (DOXYCYCLINE HYCLATE) 1 tablet twice daily x 10 days ASPIR-LOW 81 MG TBEC (ASPIRIN) 1 tablet daily while holding plavix  Other patient instructions:  You have an appointment at the [facility] on May 12 @ 1:30 pm (please arrive 15 minutes prior to appointment time). The clinics are located on the ground floor of [facility]. Please bring a picture ID, insurance card, and any applicable co-pay. You also have an appointment with Dr. Polly Cobia in the Outpatient Clinic on May 26 @ 10:20 am.  Please call the cardiologist as soon as possible to schedule an appointment with them. Please STOP taking your Plavix until you follow-up with the cardiologist. Take a baby aspirin daily instead. I have also prescribed for you an antibiotic called doxycycline. Take this as directed for 10  days.  Note: Hospital Discharge Medications & Other Instructions handout was printed, one copy for patient and a second copy to be placed in hospital chart.  Prescriptions: NITROSTAT 0.4 MG SUBL (NITROGLYCERIN) place one pill under tongue every 5 mins as needed for chest pain to a max of 3 pills in 15 mins  #30 x 3   Entered and Authorized by:   Silvestre Gunner MD   Signed by:   Silvestre Gunner MD on 01/20/2010   Method used:   Handwritten   RxID:    [id number] DOXYCYCLINE HYCLATE 100 MG CAPS (DOXYCYCLINE HYCLATE) 1 tablet twice daily x 10 days  #20 x 0   Entered and Authorized by:   Silvestre Gunner MD   Signed by:   Silvestre Gunner MD on 01/20/2010   Method used:   Electronically to        [facility] [address] (retail)       [phone]       [place], [place]  [address]       Ph: [phone]       Fax: [phone]   RxID:   [id number]   Appended Document: Hospital Discharge Update I forgot to mention to please schedule a colonoscopy for her at her follow-up appointment given that she has been having rectal bleeding and was FOBT + during hospitalization.

## 2010-10-12 NOTE — Discharge Summary (Signed)
  Hospital Discharge  Date of admission: 24 Sep 2009  Date of discharge:  26 Sep 2009  Brief reason for admission/active problems: 1. Abdominal pain, dark blood in stool. Normal upper endoscopy and flex sigmoidoscopy. (Patient did have Schatzki's ring, hemorrhagic gastritis on EGD). Blood seen in rectal skin but normal stool in the colon. Dr. Bosie Clos recommends repeat colonoscopy in 5 year or earlier if drop in Hgb.  2. Chest pain, likely musculoskeletal.   Followup needed:  1. Please check CBC 2. Please check for resolution of chest pain.   The medication and problem lists have been updated.  Please see the dictated discharge summary for details.       Updated Prior Medication List: PLAVIX 75 MG TABS (CLOPIDOGREL BISULFATE) Take one tablet by mouth daily NITROGLYCERIN 0.4 MG SUBL (NITROGLYCERIN) As needed for chest pain METOPROLOL TARTRATE 100 MG TABS (METOPROLOL TARTRATE) Take 1 tablet by mouth once a day CRESTOR 20 MG TABS (ROSUVASTATIN CALCIUM) Take 1 tab by mouth at bedtime GABAPENTIN 100 MG CAPS (GABAPENTIN) Take 1 tablet by mouth three times a day  Current Allergies: No known allergies          Patient Instructions: 1)  You will be called about the follow up appointment. Please give [place] a call on [phone] if you do not hear from [place] early next week.  2)  Your aspirin has been discontinued, no other changes in medicine at this time.

## 2010-10-12 NOTE — Consult Note (Signed)
Summary: [facility]  [facility]   Imported By: Florinda Marker 09/21/2009 14:20:30  _____________________________________________________________________  External Attachment:    Type:   Image     Comment:   External Document

## 2010-10-12 NOTE — Letter (Signed)
Summary: [facility]: [facility]  [facility]   Imported By: Florinda Marker 11/09/2009 16:38:28  _____________________________________________________________________  External Attachment:    Type:   Image     Comment:   External Document

## 2010-10-12 NOTE — Assessment & Plan Note (Signed)
Summary: vaginal breeding/gg  \  Vital Signs:  Patient profile:   70 year old female Menstrual status:  postmenopausal Weight:      223 pounds BSA:     2.05 O2 Sat:      100 % on Room air Temp:     97.2 degrees F oral Pulse rate:   72 / minute Resp:     20 per minute BP sitting:   154 / 91  (right arm)  Vitals Entered By: [name] RN (Jan 19, 2010 10:55 AM)  O2 Flow:  Room air CC: Pt c/o vaginal bleeding for 3 days with cramping. This is the first time since menopause.  Also drainage under left arm from old incision. Is Patient Diabetic? No Pain Assessment Patient in pain? yes     Location: arm Intensity: 3 Type: dull Onset of pain  2 weeks  Does patient need assistance? Functional Status Self care Ambulation Normal     Menstrual Status postmenopausal   Visit Type:  New Patient Primary Provider:  [name] MD  CC:  Pt c/o vaginal bleeding for 3 days with cramping. This is the first time since menopause.  Also drainage under left arm from old incision.Marissa [name]  History of Present Illness: 70 year old with CAD with hx of multiple stents placed by Dr. Jacinto Halim between 2005 and 2009 on plavix, with asymptomatic fibroids (seen on CT in 09/2009) who presented with acute onset of heavy postmenopausal bleeding 2 weeks ago began having bleeding using 3 pads a day. The bleeding slowed  down (after she stopped her plavix) and is now largely happening when she urinates. In the interim she also has begun to develop substernal chest pain that she describes as a tingling, pressure sensation in her chest with radiation to her jaw and neck, and left arm and tingling in the fingers of her left hand (this last piece of information was elicited after i had performed an I and D --see below). Chest pain is accompanied by some dyspnea though she has been dyspnic "all the time" for the past two weeks. It has lasted for a few minutes at a time and then subsided. Sitting upright in bed seems to  alleviate this. She also has been having increasing nausea and malaise over the past past several weeks. Finally  She also has had drainage of purulent  from area of axillary region where she had removal of glands for Hidradenitis suppurativa.   Problems Prior to Update: 1)  Gi Bleeding  (ICD-578.9) 2)  Foot Pain, Bilateral  (ICD-729.5) 3)  Neuropathy  (ICD-355.9) 4)  Metabolic Acidosis  (ICD-276.2) 5)  Polyarthritis  (ICD-719.49) 6)  Anemia  (ICD-285.9) 7)  Sleep Apnea, Obstructive  (ICD-327.23) 8)  Foot Pain, Left  (ICD-729.5) 9)  Renal Insufficiency  (ICD-588.9) 10)  Hx, Personal, Past Noncompliance  (ICD-V15.81) 11)  Cad  (ICD-414.00) 12)  Gerd  (ICD-530.81) 13)  Status, Condition Influencing Health Nec  (ICD-V49.89) 14)  Tubal Ligation, Hx of  (ICD-V26.51) 15)  Abdominal Pain  (ICD-789.00) 16)  Cholecystectomy, Hx of  (ICD-V45.79) 17)  Colectomy, Hx of  (ICD-V15.2) 18)  Esophagitis  (ICD-530.10) 19)  Arthroscopy, Right Knee, Hx of  (ICD-V45.89) 20)  Disorder, 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 Take One Tablet By Mouth Daily 2)  Nitroglycerin 0.4 Mg Subl (Nitroglycerin) .... As Needed For Chest Pain 3)  Metoprolol Tartrate 100 Mg Tabs (Metoprolol Tartrate) .... Take 1 Tablet By Mouth Once A Day 4)  Crestor 20 Mg Tabs (Rosuvastatin Calcium) .... Take 1 Tab By Mouth At Bedtime 5)  Gabapentin 100 Mg Caps (Gabapentin) .... Take 1 Tablet By Mouth Three Times A Day  Current Medications (verified): 1)  Plavix 75 Mg Tabs (Clopidogrel Bisulfate) .... Take One Tablet By Mouth Daily 2)  Nitroglycerin 0.4 Mg Subl (Nitroglycerin) .... As Needed For Chest Pain 3)  Metoprolol Tartrate 100 Mg Tabs (Metoprolol Tartrate) .... Take 1 Tablet By Mouth Once A Day 4)   Crestor 20 Mg Tabs (Rosuvastatin Calcium) .... Take 1 Tab By Mouth At Bedtime 5)  Gabapentin 100 Mg Caps (Gabapentin) .... Take 1 Tablet By Mouth Three Times A Day  Allergies (verified): No Known Drug Allergies  Past History:  Past Medical History: Colonic polyps, hx of DVT, hx of Gout Hyperlipidemia Hypertension Cardiomegaly Esophagitis Diverticulosis GERD Colonic polyp CAD s/p stent placement in 8/07 Renal insufficiency with a baseline Cr approx 1.3 Hidradenitis suppurativa sp axilary sweat gland removal  Family History: Reviewed history from 04/02/2009 and no changes required. Mother --DM, CAD Sister- DM, CAD No FMHx of cancers or psychiatric dz.  Social History: Reviewed history from 04/02/2009 and no changes required. 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  The patient denies fever, weight loss, weight gain, decreased hearing, hoarseness, syncope, peripheral edema, hemoptysis, abdominal pain, melena, hematochezia, severe indigestion/heartburn, hematuria, incontinence, genital sores, muscle weakness, transient blindness, depression, unusual weight change, and enlarged lymph nodes.    Physical Exam  General:  alert, well-developed, and overweight-appearing.   Head:  normocephalic, atraumatic, and no abnormalities observed.   Eyes:  vision grossly intact, pupils equal, pupils round, and pupils reactive to light.   Ears:  no external deformities.   Nose:  no external deformity, no external erythema, and no nasal discharge.   Mouth:  pharynx pink and moist, no erythema, and no exudates.     Neck:  supple and full ROM.   Chest Wall:  no deformities and no tenderness.   Lungs:  normal respiratory effort, no crackles, and no wheezes.   Heart:  normal rate, regular rhythm, no murmur, no gallop, and no rub.   Abdomen:  soft, non-tender, normal bowel sounds, and no distention.   Msk:  normal ROM.   Extremities:  1+ left pedal edema and 1+ right pedal edema.   Neurologic:  alert & oriented X3, strength normal in all extremities, and gait normal.   Skin:  left axilla with multiple areas of scarring Psych:  Oriented X3, memory intact for recent and remote, and dysphoric affect.     Impression & Recommendations:  Problem # 1:  CHEST PAIN (ICD-786.50) Assessment New Given her history of known CAD which has required multiple stents, chest pain substernally with numbness and tingling in her hands  in the setting of menorrhagia is quite concerning for possiblity of angina and possibly MI. The positional nature of the pain could make  pericarditis or pericardial effusion possibilities as well. Will get stat ekg, cardiac enzymes, bnp and admit her to telemetry. I am holding off on plavix for now as I would like to see how anemic she is. If her EKG is concernign will give her full dose aspirin and call cardiology. Orders: T-BNP  (B Natriuretic Peptide) [phone]) T-CK MB Fract ([id number]) CXR- 2view (CXR) T-Troponin I ([id number]) T-Troponin I ([id number]) New Patient Level V (27253)  Problem # 2:  MENORRHAGIA, POSTMENOPAUSAL (ICD-627.1) Assessment: New Could be due to fibroid, exacerbated by plavix. SHe has never had this before. She is going to need evalution by Gynecology for consideration of endocervical biopsy and or TV ultrasound Orders: T-CBC w/Diff ([id number]) New Patient Level V (59563)  Problem # 3:  ABSCESS, AXILLA, LEFT (ICD-682.3) I performed I and D and sent area for cutlure. MOst likely is MRSA. WOuld put on contact precautions, start her on vancomycin, fu  cultures and see how this responds to I and D Orders: T-Culture, Wound ([id number]) T-Basic Metabolic Panel ([id number]) New Patient Level V (18841) I&D Abscess, Complex (10061)  Problem # 4:  POSTURAL LIGHTHEADEDNESS (ICD-780.4)  Likely due to severe anemia from menorrhagia. Checking state cbc and will type and screen.  Orders: New Patient Level V (66063)  Problem # 5:  NAUSEA (ICD-787.02)  could be due to her infection in her axilla, could also be mainifestation of angina. will give her zofran  Orders: New Patient Level V (01601)  Complete Medication List: 1)  Plavix 75 Mg Tabs (Clopidogrel bisulfate) .... Take one tablet by mouth daily 2)  Nitroglycerin 0.4 Mg Subl (Nitroglycerin) .... As needed for chest pain 3)  Metoprolol Tartrate 100 Mg Tabs (Metoprolol tartrate) .... Take 1 tablet by mouth once a day 4)  Crestor 20 Mg Tabs (Rosuvastatin calcium) .... Take 1 tab by mouth at bedtime 5)  Gabapentin 100 Mg Caps (Gabapentin) .... Take 1 tablet by mouth three times a day  Process Orders Check Orders Results:     [facility]: Check successful Tests Sent for requisitioning (Jan 19, 2010 12:07 PM):     01/19/2010: [facility] -- T-Culture, Wound [[id number]] (signed)     01/19/2010: [facility] -- T-Basic Metabolic Panel [phone] (signed)     01/19/2010: [facility] -- T-CBC w/Diff [[id number]] (signed)     01/19/2010: [facility] -- T-BNP  (B Natriuretic Peptide) [phone] (signed)     01/19/2010: [facility] -- T-CK MB Fract [phone] (signed)     01/19/2010: [facility] -- T-Troponin I [phone] (signed)     01/19/2010: [facility] -- T-Troponin I [phone] (signed)  PROCEDURE:Informed consent obtained after risks and benefits explained. The patients left axilla was prepped in usual steril manner. ARea was sprayed  with topical anethestic, then infiltrated the skin with 1% lidocaine. After anethesia achieved incised area with scalpel with return of laregly bloody fluid which ws sent for culture. BLeeind was stopped with pressure bandage. EBL was minimal.

## 2010-10-12 NOTE — Cardiovascular Report (Signed)
[facility]   Imported By: Margie Billet 03/10/2010 14:53:45  _____________________________________________________________________  External Attachment:    Type:   Image     Comment:   External Document

## 2010-10-12 NOTE — Consult Note (Signed)
Summary: [facility]  [facility]   Imported By: Florinda Marker 10/15/2009 09:25:57  _____________________________________________________________________  External Attachment:    Type:   Image     Comment:   External Document

## 2010-11-26 LAB — CBC
HCT: 34.8 % — ABNORMAL LOW (ref 36.0–46.0)
MCH: 22 pg — ABNORMAL LOW (ref 26.0–34.0)
MCV: 69 fL — ABNORMAL LOW (ref 78.0–100.0)
RBC: 5.05 MIL/uL (ref 3.87–5.11)
WBC: 7.4 10*3/uL (ref 4.0–10.5)

## 2010-11-27 LAB — CBC
Hemoglobin: 11.1 g/dL — ABNORMAL LOW (ref 12.0–15.0)
Hemoglobin: 11.3 g/dL — ABNORMAL LOW (ref 12.0–15.0)
MCHC: 32.1 g/dL (ref 30.0–36.0)
MCHC: 32.3 g/dL (ref 30.0–36.0)
MCV: 69.3 fL — ABNORMAL LOW (ref 78.0–100.0)
Platelets: 173 10*3/uL (ref 150–400)
RBC: 4.68 MIL/uL (ref 3.87–5.11)
RBC: 5 MIL/uL (ref 3.87–5.11)
RBC: 5.07 MIL/uL (ref 3.87–5.11)
RDW: 15 % (ref 11.5–15.5)
WBC: 6.1 10*3/uL (ref 4.0–10.5)

## 2010-11-27 LAB — URINE MICROSCOPIC-ADD ON

## 2010-11-27 LAB — TYPE AND SCREEN: Antibody Screen: NEGATIVE

## 2010-11-27 LAB — D-DIMER, QUANTITATIVE: D-Dimer, Quant: 2.69 ug/mL-FEU — ABNORMAL HIGH (ref 0.00–0.48)

## 2010-11-27 LAB — COMPREHENSIVE METABOLIC PANEL
AST: 16 U/L (ref 0–37)
Albumin: 3.1 g/dL — ABNORMAL LOW (ref 3.5–5.2)
Chloride: 111 mEq/L (ref 96–112)
Creatinine, Ser: 1.23 mg/dL — ABNORMAL HIGH (ref 0.4–1.2)
GFR calc Af Amer: 53 mL/min — ABNORMAL LOW (ref >60)
Total Bilirubin: 0.3 mg/dL (ref 0.3–1.2)

## 2010-11-27 LAB — DIFFERENTIAL
Basophils Relative: 0 % (ref 0–1)
Eosinophils Relative: 2 % (ref 0–5)
Lymphocytes Relative: 13 % (ref 12–46)
Monocytes Relative: 8 % (ref 3–12)
Neutro Abs: 6.1 10*3/uL (ref 1.7–7.7)

## 2010-11-27 LAB — BASIC METABOLIC PANEL
BUN: 12 mg/dL (ref 6–23)
CO2: 23 mEq/L (ref 19–32)
Calcium: 9.1 mg/dL (ref 8.4–10.5)
Creatinine, Ser: 1.13 mg/dL (ref 0.4–1.2)
GFR calc Af Amer: 58 mL/min — ABNORMAL LOW (ref >60)
Glucose, Bld: 76 mg/dL (ref 70–99)

## 2010-11-27 LAB — POCT I-STAT, CHEM 8
Calcium, Ion: 1.22 mmol/L (ref 1.12–1.32)
Chloride: 108 mEq/L (ref 96–112)
HCT: 38 % (ref 36.0–46.0)
Potassium: 5.6 mEq/L — ABNORMAL HIGH (ref 3.5–5.1)
Sodium: 140 mEq/L (ref 135–145)

## 2010-11-27 LAB — PROTIME-INR: INR: 0.93 (ref 0.00–1.49)

## 2010-11-27 LAB — URINALYSIS, ROUTINE W REFLEX MICROSCOPIC
Bilirubin Urine: NEGATIVE
Specific Gravity, Urine: 1.015 (ref 1.005–1.030)
Urobilinogen, UA: 0.2 mg/dL (ref 0.0–1.0)

## 2010-11-27 LAB — APTT: aPTT: 28 seconds (ref 24–37)

## 2010-11-30 LAB — BASIC METABOLIC PANEL
BUN: 9 mg/dL (ref 6–23)
BUN: 9 mg/dL (ref 6–23)
CO2: 25 mEq/L (ref 19–32)
CO2: 28 mEq/L (ref 19–32)
Calcium: 9.4 mg/dL (ref 8.4–10.5)
Chloride: 106 mEq/L (ref 96–112)
Creatinine, Ser: 1.13 mg/dL (ref 0.4–1.2)
GFR calc Af Amer: 58 mL/min — ABNORMAL LOW (ref >60)
GFR calc non Af Amer: 46 mL/min — ABNORMAL LOW (ref >60)
Glucose, Bld: 93 mg/dL (ref 70–99)
Glucose, Bld: 98 mg/dL (ref 70–99)
Potassium: 3.2 mEq/L — ABNORMAL LOW (ref 3.5–5.1)
Sodium: 138 mEq/L (ref 135–145)

## 2010-11-30 LAB — TYPE AND SCREEN

## 2010-11-30 LAB — FACTOR 8 RISTOCETIN COFACTOR: Ristocetin Co-factor, Plasma: 249 % — ABNORMAL HIGH (ref 42–200)

## 2010-11-30 LAB — CBC
HCT: 30.1 % — ABNORMAL LOW (ref 36.0–46.0)
Hemoglobin: 9.5 g/dL — ABNORMAL LOW (ref 12.0–15.0)
MCHC: 31.3 g/dL (ref 30.0–36.0)
MCHC: 31.5 g/dL (ref 30.0–36.0)
MCV: 69.1 fL — ABNORMAL LOW (ref 78.0–100.0)
MCV: 69.8 fL — ABNORMAL LOW (ref 78.0–100.0)
Platelets: 199 10*3/uL (ref 150–400)
Platelets: ADEQUATE 10*3/uL (ref 150–400)
RDW: 13.7 % (ref 11.5–15.5)
RDW: 13.8 % (ref 11.5–15.5)
WBC: 8.5 10*3/uL (ref 4.0–10.5)

## 2010-11-30 LAB — CULTURE, BLOOD (ROUTINE X 2)
Culture: NO GROWTH
Culture: NO GROWTH

## 2010-11-30 LAB — WOUND CULTURE

## 2010-11-30 LAB — DIFFERENTIAL
Basophils Relative: 0 % (ref 0–1)
Eosinophils Absolute: 0.2 10*3/uL (ref 0.0–0.7)
Lymphs Abs: 0.7 10*3/uL (ref 0.7–4.0)
Monocytes Absolute: 0.3 10*3/uL (ref 0.1–1.0)
Neutro Abs: 7.3 10*3/uL (ref 1.7–7.7)

## 2010-11-30 LAB — RETICULOCYTES
RBC.: 4.78 MIL/uL (ref 3.87–5.11)
Retic Count, Absolute: 43 10*3/uL (ref 19.0–186.0)
Retic Ct Pct: 0.9 % (ref 0.4–3.1)

## 2010-11-30 LAB — FACTOR 8 ASSAY: Coagulation Factor VIII: 179 % — ABNORMAL HIGH (ref 73–140)

## 2010-11-30 LAB — VITAMIN B12: Vitamin B-12: 321 pg/mL (ref 211–911)

## 2010-11-30 LAB — VON WILLEBRAND ANTIGEN: Von Willebrand Antigen, Plasma: 283 % — ABNORMAL HIGH (ref 50–217)

## 2010-11-30 LAB — CARDIAC PANEL(CRET KIN+CKTOT+MB+TROPI)
CK, MB: 1.7 ng/mL (ref 0.3–4.0)
CK, MB: 2.2 ng/mL (ref 0.3–4.0)
Total CK: 123 U/L (ref 7–177)
Troponin I: 0.01 ng/mL (ref 0.00–0.06)

## 2010-11-30 LAB — IRON AND TIBC
Iron: 24 ug/dL — ABNORMAL LOW (ref 42–135)
UIBC: 188 ug/dL

## 2010-11-30 LAB — HEMOGLOBIN AND HEMATOCRIT, BLOOD: HCT: 30.7 % — ABNORMAL LOW (ref 36.0–46.0)

## 2010-11-30 LAB — PROTIME-INR: INR: 0.99 (ref 0.00–1.49)

## 2010-11-30 LAB — CK TOTAL AND CKMB (NOT AT ARMC): Total CK: 127 U/L (ref 7–177)

## 2010-11-30 LAB — FERRITIN: Ferritin: 218 ng/mL (ref 10–291)

## 2010-12-13 ENCOUNTER — Inpatient Hospital Stay (HOSPITAL_COMMUNITY)
Admission: EM | Admit: 2010-12-13 | Discharge: 2010-12-15 | DRG: 392 | Disposition: A | Discharge status: Home / Self Care | Payer: Medicare Other | Attending: Internal Medicine | Admitting: Internal Medicine

## 2010-12-13 DIAGNOSIS — I129 Hypertensive chronic kidney disease with stage 1 through stage 4 chronic kidney disease, or unspecified chronic kidney disease: Secondary | ICD-10-CM | POA: Diagnosis present

## 2010-12-13 DIAGNOSIS — K449 Diaphragmatic hernia without obstruction or gangrene: Secondary | ICD-10-CM | POA: Diagnosis present

## 2010-12-13 DIAGNOSIS — Z9119 Patient's noncompliance with other medical treatment and regimen: Secondary | ICD-10-CM

## 2010-12-13 DIAGNOSIS — R109 Unspecified abdominal pain: Secondary | ICD-10-CM | POA: Diagnosis present

## 2010-12-13 DIAGNOSIS — I2510 Atherosclerotic heart disease of native coronary artery without angina pectoris: Secondary | ICD-10-CM | POA: Diagnosis present

## 2010-12-13 DIAGNOSIS — N390 Urinary tract infection, site not specified: Secondary | ICD-10-CM | POA: Diagnosis present

## 2010-12-13 DIAGNOSIS — Z860100 Personal history of colon polyps, unspecified: Secondary | ICD-10-CM

## 2010-12-13 DIAGNOSIS — Z86718 Personal history of other venous thrombosis and embolism: Secondary | ICD-10-CM

## 2010-12-13 DIAGNOSIS — K209 Esophagitis, unspecified without bleeding: Secondary | ICD-10-CM | POA: Diagnosis present

## 2010-12-13 DIAGNOSIS — E785 Hyperlipidemia, unspecified: Secondary | ICD-10-CM | POA: Diagnosis present

## 2010-12-13 DIAGNOSIS — M109 Gout, unspecified: Secondary | ICD-10-CM | POA: Diagnosis present

## 2010-12-13 DIAGNOSIS — Z91199 Patient's noncompliance with other medical treatment and regimen due to unspecified reason: Secondary | ICD-10-CM

## 2010-12-13 DIAGNOSIS — R1310 Dysphagia, unspecified: Secondary | ICD-10-CM | POA: Diagnosis present

## 2010-12-13 DIAGNOSIS — N179 Acute kidney failure, unspecified: Secondary | ICD-10-CM | POA: Diagnosis present

## 2010-12-13 DIAGNOSIS — K219 Gastro-esophageal reflux disease without esophagitis: Principal | ICD-10-CM | POA: Diagnosis present

## 2010-12-13 DIAGNOSIS — Z9861 Coronary angioplasty status: Secondary | ICD-10-CM

## 2010-12-13 DIAGNOSIS — N189 Chronic kidney disease, unspecified: Secondary | ICD-10-CM | POA: Diagnosis present

## 2010-12-13 DIAGNOSIS — R197 Diarrhea, unspecified: Secondary | ICD-10-CM | POA: Diagnosis present

## 2010-12-13 HISTORY — DX: Disorder of kidney and ureter, unspecified: N28.9

## 2010-12-13 HISTORY — DX: Essential (primary) hypertension: I10

## 2010-12-13 LAB — CBC
HCT: 40.1 % (ref 36.0–46.0)
MCHC: 30.2 g/dL (ref 30.0–36.0)
Platelets: 201 10*3/uL (ref 150–400)
RDW: 14.6 % (ref 11.5–15.5)
WBC: 7.6 10*3/uL (ref 4.0–10.5)

## 2010-12-13 LAB — BASIC METABOLIC PANEL
Calcium: 9.6 mg/dL (ref 8.4–10.5)
GFR calc Af Amer: 49 mL/min — ABNORMAL LOW (ref >60)
GFR calc non Af Amer: 40 mL/min — ABNORMAL LOW (ref >60)
Potassium: 4 mEq/L (ref 3.5–5.1)
Sodium: 142 mEq/L (ref 135–145)

## 2010-12-13 LAB — DIFFERENTIAL
Basophils Absolute: 0 10*3/uL (ref 0.0–0.1)
Basophils Relative: 0 % (ref 0–1)
Eosinophils Absolute: 0.3 10*3/uL (ref 0.0–0.7)
Monocytes Relative: 10 % (ref 3–12)
Neutro Abs: 5.2 10*3/uL (ref 1.7–7.7)
Neutrophils Relative %: 68 % (ref 43–77)

## 2010-12-14 ENCOUNTER — Emergency Department (HOSPITAL_COMMUNITY): Payer: Medicare Other

## 2010-12-14 ENCOUNTER — Encounter (HOSPITAL_COMMUNITY): Payer: Self-pay | Admitting: Radiology

## 2010-12-14 DIAGNOSIS — R079 Chest pain, unspecified: Secondary | ICD-10-CM

## 2010-12-14 DIAGNOSIS — N898 Other specified noninflammatory disorders of vagina: Secondary | ICD-10-CM

## 2010-12-14 LAB — HEMOGLOBIN A1C
Hgb A1c MFr Bld: 6 % — ABNORMAL HIGH (ref <5.7)
Mean Plasma Glucose: 126 mg/dL — ABNORMAL HIGH (ref <117)

## 2010-12-14 LAB — TROPONIN I: Troponin I: 0.02 ng/mL (ref 0.00–0.06)

## 2010-12-14 LAB — CBC
HCT: 38.5 % (ref 36.0–46.0)
Hemoglobin: 11.9 g/dL — ABNORMAL LOW (ref 12.0–15.0)
MCH: 22 pg — ABNORMAL LOW (ref 26.0–34.0)
MCHC: 30.9 g/dL (ref 30.0–36.0)
MCV: 71 fL — ABNORMAL LOW (ref 78.0–100.0)

## 2010-12-14 LAB — CK TOTAL AND CKMB (NOT AT ARMC)
CK, MB: 2.2 ng/mL (ref 0.3–4.0)
CK, MB: 2.4 ng/mL (ref 0.3–4.0)
Relative Index: 1.4 (ref 0.0–2.5)
Relative Index: 1.4 (ref 0.0–2.5)
Total CK: 168 U/L (ref 7–177)

## 2010-12-14 LAB — URINALYSIS, ROUTINE W REFLEX MICROSCOPIC
Glucose, UA: NEGATIVE mg/dL
Ketones, ur: NEGATIVE mg/dL
Nitrite: NEGATIVE
Protein, ur: NEGATIVE mg/dL

## 2010-12-14 LAB — APTT: aPTT: 27 seconds (ref 24–37)

## 2010-12-14 LAB — BASIC METABOLIC PANEL
Calcium: 9.3 mg/dL (ref 8.4–10.5)
GFR calc non Af Amer: 44 mL/min — ABNORMAL LOW (ref >60)
Glucose, Bld: 99 mg/dL (ref 70–99)
Sodium: 136 mEq/L (ref 135–145)

## 2010-12-14 LAB — LIPID PANEL
Cholesterol: 146 mg/dL (ref 0–200)
Total CHOL/HDL Ratio: 2.5 RATIO
VLDL: 18 mg/dL (ref 0–40)

## 2010-12-14 LAB — CARDIAC PANEL(CRET KIN+CKTOT+MB+TROPI): Troponin I: <0.01 ng/mL (ref 0.00–0.06)

## 2010-12-14 LAB — D-DIMER, QUANTITATIVE: D-Dimer, Quant: 11.61 ug/mL-FEU — ABNORMAL HIGH (ref 0.00–0.48)

## 2010-12-14 LAB — PROTIME-INR
INR: 0.98 (ref 0.00–1.49)
Prothrombin Time: 13.2 seconds (ref 11.6–15.2)

## 2010-12-14 LAB — URINE MICROSCOPIC-ADD ON

## 2010-12-14 MED ORDER — IOHEXOL 300 MG/ML  SOLN
100.0000 mL | Freq: Once | INTRAMUSCULAR | Status: AC | PRN
  Administered 2010-12-14: 100 mL via INTRAVENOUS

## 2010-12-15 ENCOUNTER — Encounter: Payer: Self-pay | Admitting: Internal Medicine

## 2010-12-15 ENCOUNTER — Inpatient Hospital Stay (HOSPITAL_COMMUNITY): Payer: Medicare Other

## 2010-12-15 LAB — CBC
HCT: 39.6 % (ref 36.0–46.0)
Hemoglobin: 12.5 g/dL (ref 12.0–15.0)
RDW: 14.4 % (ref 11.5–15.5)
WBC: 11.7 10*3/uL — ABNORMAL HIGH (ref 4.0–10.5)

## 2010-12-15 LAB — BASIC METABOLIC PANEL
Calcium: 9.1 mg/dL (ref 8.4–10.5)
GFR calc non Af Amer: 30 mL/min — ABNORMAL LOW (ref >60)
Potassium: 4.5 mEq/L (ref 3.5–5.1)
Sodium: 137 mEq/L (ref 135–145)

## 2010-12-15 LAB — CARDIAC PANEL(CRET KIN+CKTOT+MB+TROPI)
CK, MB: 2.4 ng/mL (ref 0.3–4.0)
Relative Index: 1.5 (ref 0.0–2.5)

## 2010-12-15 NOTE — Discharge Summary (Signed)
Date of Admission: 12/13/2010     Date of Discharge: 12/15/2010     Brief Hospital Summary: Pt is a 70yo female with PMHX of CAD s/p stenting with intermittent Plavix usage, colonic polyps, diverticulosis, HTN who presented to [facility] for evaluation of chest pain that occurred 2 days prior to admission, abdominal pain on day prior to admission and loose stools x 1 week.  1) CP - During initial evaluation, 3rd set of cardiac enzymes showed elevation of troponins to 0.1, at which time cardiologist Dr. Jacinto Halim was consulted. 3 additional sets of cardiac enzymes were negative, and ACS ruled out. CP/ Abd pain thought 2/2 GI source, likely. Will have outpatient Cardiology f/u in 2-3 weeks. 2) Abdominal pain stabilized during hospital course, thought 2/2 GERD with hx of medium sized hiatal hernia, known esophagitis. Pt started on PPI, symptoms stable by time of discharge. 3) HTN - BP elevated during hospital course, was initially increased of lisinopril/ hctz and bp normalizede, but had acute increase in cr to 1.6, thought 2/2 over diuresis. Will be dc on outpt med, and consider escalation if indicated. 4) Acute on chronic renal failure on day of discharge, in setting of overdiuresis, pt fluid resuscitated and tolerating po by time of discharge. Will follow-up outpatient. 5) UTI - Pt found to have UTI during admission, treated with Cipro x 3 days, urine culture pending at time of dc.     Studies pending at time of DC: Urine culture and sensitivities.     Other follow-up issues: 1) BMET - assess renal function 2) F/U of urine culture/ sensitivities 3) Outpt follow-up with gyn - regarding vaginal bleeding (already follows with them) 4) Please make sure she followed-up with Dr. Jacinto Halim.     Medications on discharge: 1) Cipro 250mg  BID x 1 additional day 2) Omeprazole 40mg  qday 3) Crestor 40mg  qHS 4) Lisinopril/ HCTZ 20/12.5mg  qday 5) Metoprolol XL 100mg  qday 6) Nitroglycerin 0.4mg  SL every 5 minutes x max 3  doses as needed for chest pain.

## 2010-12-15 NOTE — H&P (Signed)
INTERNAL MEDICINE ADMISSION HISTORY AND PHYSICAL **Place in front of progress notes section of chart**  Attending: Dr. Aundria Rud 1st contact: Derrek Gu [phone] 2nd contact: Dr. Denton Meek [phone] Weekends, [place], After 5pm Weekdays: 1st contact: [phone] 2nd contact: [phone]  PCP: Dr. Denton Meek  CC: chest pain  HPI:70 yo woman with PMH of CAD s/p stent placement in 2007 (cath in 2009 revealed patent stents and EF 60%), colon polyps, diverticulosis, gout, DVT, HTN presents for chest pain that started the day prior to admission when she was sitting in church.  She describes the pain as sharp, 7/10 in severity associated with diaphoresis, lasting about several minutes, no alleviating or exacerbation factor.  She endorses SOB in which she uses 2-3 pillows at night chronically, chronic nausea and diarrhea with some blood in her stool but no vomiting.  She had about 5 episodes yesterday.  She also states that she had similar pain episode in the past when she had her heart attack. She also has numbness sensation in her hands and body.  She reports having vaginal bleeding/spotting x 1 year and was seen at [facility] and she was told that she had tumor but no treatment. She said that she stopped taking Plavix and aspirin whenever she has bleeding.        ALLERGIES: NKDA  PAST MEDICAL HISTORY: DVT, hx of Gout Hyperlipidemia Hypertension Cardiomegaly Esophagitis Diverticulosis GERD Hx GI bleed (follows with Dr. Bosie Clos)   -EGD 09/2009: 1. Medium-sized hiatal hernia - likely source of her dysphagia.   2. Widely patent distal esophageal ring, status post dilation without       success due to large diameter.   3. Focal area of hemorrhagic gastritis.   4. Suspect dysphagia due to hiatal hernia, not esophageal ring.  Colonoscopy 09/2009: 1. Small amount of bright red blood in perianal area without any       obvious source.   2. No blood in rectum.   3. Solid brown stool in rectum.   4.  Question of small fissure versus vaginal source of bleeding.  Colonic polyp Postmenopausal bleeding  - hysteroscopy with d/c in june 2011, august 2011 no endometrial atypia, dysplasia, or malignancy CAD   -s/p stent placement in 8/07  -followed with Dr. 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   ROS: as per HPI, all other systems reviewed and negative  VITALS: T: 98.1  P: 75  BP: 185/81  R: 18  O2SAT: 100%  ON: RA PHYSICAL EXAM: General:  alert, well-developed, NAD, cooperative, A&Ox3 Head:  normocephalic and atraumatic.   Eyes:  PERRLA, EOMI, vision grossly intact, conjuctive and sclerae within normal limits.   Mouth:  MMM, no erythema, no exudates,  or lesions.   Neck:  supple, full ROM, trachea midline, no palp masses, no JVD, no carotid bruits.   Lungs:  CTAB, normal respiratory effort  Heart: RRR, no M/R/G Abdomen:  soft, NT, ND, BS present and normoactive, no palpable masses  Msk:  no joint swelling, warmth, or erythema  Neurologic:  CN II-XII intact,+5 strength globally, sensation grossly intact.   Skin:  turgor normal and no rashes.   Psych: memory intact for recent and remote, normally  interactive, good eye contact, affect as expected GU: exam limited 2/2 habitus.  pelvic exam did not reveal any blood in the vaginal vault or abnormal mass.   LABS: WBC                                      7.6               4.0-10.5         K/uL  RBC                                      5.57       h      3.87-5.11        MIL/uL  Hemoglobin (HGB)                         12.1              12.0-15.0        g/dL  Hematocrit (HCT)                         40.1              36.0-46.0        %  MCV                                      72.0       l      78.0-100.0       fL  MCH -                                    21.7       l      26.0-34.0        pg  MCHC                                     30.2              30.0-36.0        g/dL  RDW                                      14.6              11.5-15.5        %  Platelet Count (PLT)  201               150-400          K/uL  Neutrophils, %                           68                43-77            %  Lymphocytes, %                           19                12-46            %  Monocytes, %                             10                3-12             %  Eosinophils, %                           3                 0-5              %  Basophils, %                             0                 0-1              %  Neutrophils, Absolute                    5.2               1.7-7.7          K/uL  Lymphocytes, Absolute                    1.4               0.7-4.0          K/uL  Monocytes, Absolute                      0.7               0.1-1.0          K/uL  Eosinophils, Absolute                    0.3               0.0-0.7          K/uL  Basophils, Absolute                      0.0               0.0-0.1          K/uL   Sodium (NA)  142               135-145          mEq/L  Potassium (K)                            4.0               3.5-5.1          mEq/L  Chloride                                 108                96-112           mEq/L  CO2                                      27                19-32            mEq/L  Glucose                                  87                70-99            mg/dL  BUN                                      14                6-23             mg/dL  Creatinine                               1.31       h      0.4-1.2          mg/dL  GFR, Est Non African American            40         l      >60              mL/min  GFR, Est African American                49         l      >60              mL/min    Oversized comment, see footnote  1  Calcium                                  9.6               8.4-10.5         mg/dL Protime ( Prothrombin Time)              13.2              11.6-15.2  seconds  INR                                      0.98              0.00-1.49\  PTT(a-Partial Thromboplastn Time)        27                24-37            seconds  D-Dimer, Fibrin Derivatives              11.61      h      0.00-0.48        ug/mL-FEU  Creatine Kinase, Total                   168               7-177            U/L  CK, MB                                   2.2               0.3-4.0          ng/mL  Relative Index                           1.3               0.0-2.5 Troponin I                               0.02              0.00-0.06        ng/mL  Color, Urine                             YELLOW            YELLOW  Appearance                               CLEAR             CLEAR  Specific Gravity                         1.017             1.005-1.030  pH                                       6.0               5.0-8.0  Urine Glucose                            NEGATIVE          NEG              mg/dL  Bilirubin  NEGATIVE          NEG  Ketones                                  NEGATIVE          NEG              mg/dL  Blood                                    NEGATIVE          NEG  Protein                                  NEGATIVE          NEG               mg/dL  Urobilinogen                             1.0               0.0-1.0          mg/dL  Nitrite                                  NEGATIVE          NEG  Leukocytes                               SMALL      a      NEG   Squamous Epithelial / LPF                RARE              RARE  WBC / HPF                                11-20             <3               WBC/hpf  RBC / HPF                                0-2               <3               RBC/hpf  Bacteria / HPF                           MANY       a      RARE  Urine-Other                              SEE NOTE.    MUCOUS PRESENT  CTA chest:  Findings:  There is good opacification of the central, lobar, and   segmental pulmonary arteries.  No focal filling defects.  No   evidence of significant pulmonary embolus.    Coronary artery calcification.  Calcification of nonaneurysmal   thoracic aorta.  No significant lymphadenopathy in the chest.  No   pleural or pericardial effusion or thickening.  Dependent   atelectasis in the lung bases.  Scattered emphysematous blebs. No   focal airspace disease or consolidation.  Degenerative changes in   the spine.  Thoracic kyphosis.  Surgical absence of the   gallbladder.  Small esophageal hiatal hernia.  Visualized portions   of the upper abdominal organs are otherwise unremarkable.  No   significant changes since the prior study.    Review of the MIP images confirms the above findings.    IMPRESSION:   No evidence of pulmonary embolus.   ASSESSMENT AND PLAN: (1)Chest pain.  Differential dx: GERD, musculoskeletal, or ACS.  Need to rule ACS given her risk factors: previous MI, HTN, and HLP. EKG is without evidence of ischemia or other concerning process.  D-dimer positive but CTA negative for PE, aortic dissection, PTX, or PNA.       -Admit to telemetry -Cycle cardiac enzymes x 3 -Pain control with Morphine and NTG SL as needed -Continue Crestor 20mg  by mouth once daily -Continue  Metoprolol XL 100mg   -Continue ASA -FLP, TSH, and A1c to risk stratify  (2)HTN:  Continue home medications   (3) Vaginal bleeding vs. rectal: Pt has an extensive hx of rectal vs vaginal bleeding.  She had EGD and colonoscopy in 2011 as described above.  She also follows with gyn and has had 2 hysteroscopies with d and c; endometrial bx negative for malignancy.  Vaginal exam did not demonstrate any bleeding or adnexal masses, no evidence of rectal bleeding on exam.  Her Hbg is stable and at baseline - Will check FOBT x3. - Pt may need repeat colonoscopy/ EGD as outpt - Will defer further management of her vaginal bleeding to her gyn  ()VTE PROPH: SCDs   Clinical Lists Changes     Signed by Nelda Bucks DO on 12/14/2010 at 7:07 AM  ________________________________________________________________________

## 2010-12-16 LAB — URINE CULTURE: Culture  Setup Time: 201204030936

## 2010-12-16 NOTE — Discharge Summary (Signed)
Marissa [name], Marissa [name]              ACCOUNT NO.:  [id number]  MEDICAL RECORD NO.:  [id number]           PATIENT TYPE:  I  LOCATION:  2502                         FACILITY:  [facility]  PHYSICIAN:  C. Ulyess Mort, M.D.DATE OF BIRTH:  04-Jun-1941  DATE OF ADMISSION:  12/13/2010 DATE OF DISCHARGE:  12/15/2010                              DISCHARGE SUMMARY   DISCHARGE DIAGNOSES: 1. Chest pain - likely secondary to gastrointestinal source such as     gastroesophageal reflux disease in the setting of known hiatal     hernia versus esophagitis.  Acute coronary syndrome ruled out with     negative cardiac enzymes and negative EKG.  Pulmonary embolism     ruled out with negative CTA. 2. Urinary tract infection. 3. Acute kidney injury - likely prerenal in the setting of over     diuresis.  The patient was volume repleted.  To be followed up at an     outpatient. 4. Hypertension. 5. Hyperlipidemia. 6. Abdominal pain with diarrhea - abdominal pain possibly secondary to gastroesophageal reflux disease versus diarrhea (unclear if acute or chronic because patient's history changed on repeat questioning.)  The patient remained afebrile during hospital  course. 7. History of coronary artery disease status post multiple stents.  DISCHARGE MEDICATIONS: 1. Ciprofloxacin 250 mg by mouth twice daily x1 day. 2. Omeprazole 40 mg by mouth once daily. 3. Crestor 20 mg by mouth once daily. 4. Lisinopril/HCTZ 20/12.5 mg 1 tablet by mouth once daily. 5. Metoprolol XL 100 mg 1 tablet by mouth once daily. 6. Nitroglycerin sublingual 0.4 mg, take 1 tablet every 5 minutes     times maximum of 3 doses as needed for chest pain.  DISPOSITION AND FOLLOWUP:  The patient is to follow up with the [facility] on January 07, 2011, at 1:15 p.m., at which time it is requested for BMET to be checked to assess renal function.  A urine culture and sensitivity should be followed up, which were  pending at the time of discharge.  Outpatient followup with gynecology regarding vaginal bleeding should also be established, the patient already has an outpatient gynecologist.  The patient's blood pressure should be evaluated to determine if further escalation of her home therapy is needed.  The patient is also to follow up with her cardiologist Dr. Jacinto Halim on December 28, 2010, at 2:30 p.m. for further followup and evaluation of the patient's cardiac status in consideration for stress test.  PROCEDURES PERFORMED:   1) CT angiogram (December 14, 2010) - no evidence of pulmonary embolism.  CONSULTATIONS:  Cristy Hilts. Jacinto Halim, MD of Cardiology  BRIEF ADMITTING HISTORY AND PHYSICAL:  The patient is a 70 year old female with a past medical history of coronary artery disease status post stent placement in 2007, colonic polyps, diverticulosis, hypertension, gout who presents to [facility] for chest pain that started 2 days prior to admission when the patient was sitting in church.  The pain was initially described as a sharp, 7/10 in severity with associated diaphoresis, which lasted several minutes and then spontaneously resolved.  No aggravating or alleviating factors appreciated.  The  patient endorses shortness of breath when she uses 2-3 pillows at night chronically, chronic nausea and diarrhea with some blood in her stool, but no vomiting.  Of note, the patient states that she has stop taking her Plavix and aspirin whenever she have vaginal bleeding for which she is being followed outpatient at [facility] hospital.  PHYSICAL EXAM ON ADMISSION:  VITAL SIGNS:  Temperature 98.1, pulse 75, blood pressure 185/81, respirations 18, O2 saturation 100% on room air. GENERAL:  Alert, well developed, no acute distress, cooperative. HEENT:  Mouth, moist mucous membranes.  No erythema, exudates, or lesions. NECK:  No carotid bruits.  No JVD.  Supple.  Full range of motion. LUNGS:  Clear to auscultation  bilaterally. HEART:  Regular rate and rhythm.  No murmurs, rubs, or gallops. ABDOMEN:  Soft, nontender, nondistended.  Bowel sounds present and normoactive.  No palpable masses. MUSCULOSKELETAL:  No joint swelling, warmth or erythema. NEUROLOGIC:  Alert and oriented x3.  Cranial nerves II through XII grossly intact. GU:  Exam is limited secondary to habitus.  Pelvic exam did not reveal any blood in the vaginal vault or abnormal masses.  LABORATORIES ON ADMISSION: 1. CBC:  WBC 7.6, hemoglobin 12.1, hematocrit 40.1, platelets 201. 2. BMET:  Sodium 142, potassium 4, chloride 108, CO2 27, glucose 87,     BUN 14, creatinine 1.31. 3. Coagulation studies:  PT is 13.2, INR 0.98, PTT 27. 4. D-dimer 11.6. 5. Cardiac enzymes:  CK total 168, CK-MB 2.2, troponin 0.02. 6. UA:  Negative for nitrites, protein blood per ketones, small     leukocytes, 11-20 wbc's, many bacteria.  HOSPITAL COURSE BY PROBLEM: 1. Chest pain.  Given the patient's known coronary artery disease with     medication noncompliance of her Plavix, acutely our initial concern     was to rule out ACS.  Cardiac enzymes were cycled and the third set     of cardiac enzymes did show a bump in troponin to 0.1, although no     acute EKG changes were noted at that time.  As a result, the     patient's primary cardiologist Dr. Jacinto Halim was consulted who provided     inpatient evaluation.  Cardiac enzymes were cycled again by 3 and     remaining sets were negative.  Therefore, acute coronary syndrome     was ruled out. Patient will have close follow-up with Dr. Jacinto Halim as an outpatient.     Chest pain seems to be more so related to     gastrointestinal etiology, possibly secondary to GERD in the     setting of known hiatal hernia versus esophagitis, which was more     consistent with the patient's explanation of epigastric pain,     feeling of food, globus sensation.  Therefore, the patient was     started on PPI therapy and she noted  significant improvement of her     pain by the time of discharge.  Thyroid function tests were also     checked and were within normal limits and with initial elevation of     D-dimer, CT angiogram was ordered and found to be negative for     evidence of PE. 2. Urinary tract infection - the patient was found to have urinary     tract infection on admission. She therefore is being treated with 3-     day course of ciprofloxacin.  Urine cultures were pending at the     time of discharge,  however, will be followed up as an outpatient     and antibiotics to be adjusted if indicated. 3. Hypertension - during hospital course, blood pressure was initially     elevated to maximum of 203/77 and the patient was therefore     increased of her home HCTZ and lisinopril.  However, blood pressure     steadily declined to 108 systolic.  However, the patient     experienced a slight elevation in her creatinine on the day of     discharge.  Therefore at the time of discharge, the patient will be     resumed of her home medications with further adjustment regimen as     indicated on hospital discharge. 4. Acute-on-chronic renal insufficiency - on the date of discharge,     the patient had a mild elevation of her creatinine to 1.6 from a     baseline of 1.3, likely renal prerenal secondary to diuretics in     the setting of high blood pressures.  Therefore, the patient was     fluid resuscitated and renal function will be further addressed at     the time of hospital followup. 5. Abdominal pain with diarrhea - abdominal pain likely secondary to     GERD as pain largely localized to epigastrium, and despite medium-sized hiatal hernia, was     not on outpatient PPI therapy at home. The patient's description of her diarrhea was inconsistent.  She      had initially mentioned that it was a chronic diarrhea, however, then     mentioned that she had only had it for approximately a week.     However, during the  hospital course, she remained afebrile, has not     recently been on any antibiotic therapy and did not seem to be     hemodynamically unstable.  Therefore, she was volume resuscitated.     Despite having a history of diverticulosis, the patient remained     afebrile without significant abdominal pain, did not have any     hypotension, therefore Acute diverticulitis was less likely.  DISCHARGE LABORATORIES:  CBC:  WBC 11.7, hemoglobin of 12.5, hematocrit 39.6, platelets were clumped.  BMET: Sodium 137, potassium 4.5, chloride 105, CO2 23, glucose 123, BUN 18, creatinine 1.68.  VITAL SIGNS ON DISCHARGE:  Temperature 98.2, pulse 68, blood pressure 135/65, respirations 24, and O2 saturation 100%.    ______________________________ Johnette Abraham, DO   ______________________________ C. Ulyess Mort, M.D.    SK/MEDQ  D:  12/15/2010  T:  12/16/2010  Job:  [id number]  cc:   Cristy Hilts. Jacinto Halim, MD [name], MD  Electronically Signed by Johnette Abraham DO on 12/16/2010 06:19:53 AM Electronically Signed by Eliezer Lofts M.D. on 12/16/2010 03:03:59 PM

## 2010-12-17 LAB — BASIC METABOLIC PANEL
BUN: 11 mg/dL (ref 6–23)
CO2: 24 mEq/L (ref 19–32)
Calcium: 9.1 mg/dL (ref 8.4–10.5)
Creatinine, Ser: 1.18 mg/dL (ref 0.4–1.2)
GFR calc non Af Amer: 46 mL/min — ABNORMAL LOW (ref >60)
Glucose, Bld: 82 mg/dL (ref 70–99)

## 2010-12-17 LAB — DIFFERENTIAL
Basophils Absolute: 0 10*3/uL (ref 0.0–0.1)
Basophils Relative: 0 % (ref 0–1)
Lymphocytes Relative: 14 % (ref 12–46)
Monocytes Absolute: 0.6 10*3/uL (ref 0.1–1.0)
Neutro Abs: 4.3 10*3/uL (ref 1.7–7.7)

## 2010-12-17 LAB — CBC
Hemoglobin: 9.8 g/dL — ABNORMAL LOW (ref 12.0–15.0)
MCHC: 31.7 g/dL (ref 30.0–36.0)
Platelets: 174 10*3/uL (ref 150–400)
RDW: 13.9 % (ref 11.5–15.5)

## 2010-12-17 LAB — URIC ACID: Uric Acid, Serum: 8.4 mg/dL — ABNORMAL HIGH (ref 2.4–7.0)

## 2010-12-23 ENCOUNTER — Encounter: Payer: Self-pay | Admitting: Internal Medicine

## 2010-12-27 NOTE — Consult Note (Signed)
[name], [name]              ACCOUNT NO.:  [id number]  MEDICAL RECORD NO.:  [id number]           PATIENT TYPE:  I  LOCATION:  2502                         FACILITY:  [facility]  PHYSICIAN:  Cristy Hilts. Jacinto Halim, MD       DATE OF BIRTH:  Apr 27, 1941  DATE OF CONSULTATION:  12/14/2010 DATE OF DISCHARGE:                                CONSULTATION   REQUESTING PHYSICIAN:  [name], MD  REASON FOR CONSULTATION:  Please evaluate unstable angina, chest pain.  HISTORY:  Marissa [name] is a pleasant 70 year old female who I had seen her in the past.  She was admitted yesterday with chest pain.  The patient describes chest pain in the middle of her chest with radiation to her left arm.  History is in fact vague with flight of ideas with regard to chest pain as she says that she is having numbness in her arm, her feet feels numb, neck feels numb.  She says pain lasts for about 2-3 minutes and spontaneously subsides.  On further review, patient states that she has been having significant reflux lately.  She has also noticed that even eating a little bit makes her feel very full.  Overall, she has not been feeling generally well in the last 2-3 days, but especially in the last several weeks.  There was no associated nausea, vomiting, palpitations, or diaphoresis with this chest discomfort.  She did not take any nitroglycerin.  She presented to the emergency room where taking nitroglycerin helped her chest discomfort partially, but eventually she felt better.  REVIEW OF SYSTEMS:  She has had increasing worsening abdominal discomfort and bloating.  She has had increasing GERD like symptoms recently.  She states that she is having bloody stools and has to wear diapers to prevent her clothes from getting swilled.  She has not had any vaginal discharge.  She denies any fever, nausea, vomiting.  She describes no symptoms suggestive of claudication, however, she states that foot hurts all the  time especially on walking.  She is not a diabetic.  There is no neurological deficits or TIA like symptoms or stroke.  She denies symptoms suggestive of sleep apnea.  Other systems are negative.  No heat or cold intolerance.  PAST MEDICAL HISTORY:  Significant for history of GI atresia with normal colonoscopy and she states that she has had diverticulitis in the past, but the colonoscopy was normal per the discharge summary dated September 24, 2009.  She has had  history of hemorrhagic gastritis, Schatzki's ring, musculoskeletal chest pain, hypertension, coronary artery disease, and history of chronic renal insufficiency with creatinine of 1.3, which is stable and uterine fibroids.  Her home medications include: 1. Nitroglycerin 0.4 mg 1-2 tablets sublingual p.r.n. 2. Plavix 75 mg p.o. daily, has not taken it for over a month. 3. Metoprolol succinate 100 mg p.o. daily. 4. Crestor 20 mg p.o. daily. 5. Lisinopril/HCTZ 20/12.5 one p.o. q.a.m.  ALLERGIES:  No known drug allergies.  SOCIAL HISTORY:  She is single and lives with her daughter.  She does not drink alcohol.  Does not use any tobacco products.  Has  sedentary lifestyle.  FAMILY HISTORY:  There is no history of premature coronary artery disease in the family.  PHYSICAL EXAMINATION:  GENERAL:  She is moderately built and obese.  She appears to be in no acute distress. VITAL SIGNS:  Temperature of 98.1, pulse is 76 beats per minute, regular respirations 16, blood pressure is 162/93 mmHg, was as high as 203/77 mmHg on admission. CARDIAC:  Distant heart sounds.  S1 and S2 appears to be normal without any gallop or murmur. CHEST:  Unremarkable.  No rhonchi. ABDOMEN:  Soft.  No tenderness felt anywhere.  Bowel sounds heard in all four quadrants. EXTREMITIES:  Revealed no evidence of edema.  Full range of movements. ARTERIAL EXAMINATION:  Normal except her pedal pulses were faint.  EKG demonstrates normal sinus rhythm.  Her  hemoglobin A1c was minimally elevated at 6.0.  Her lipid profile was normal.  Total cholesterol 146, triglycerides 92, HDL 58, LDL of 70.  Her CBC revealed a hemoglobin of 12.1 with hematocrit of 40.1, white count of 7600, platelet count of 201,000.  Electrolytes were normal.  BUN was 14, creatinine of 1.3.  CK- MB was 11.61 on admission.  She is ruled out for pulmonary embolism by CT angiography.  The first, second, and the third set of CK and CK-MB are negative for myocardial injury.  Troponin first set, second set negative, and third set minimally abnormal at 0.01.  Her EKG is normal.  IMPRESSION: 1. Noncardiac chest pain, I suspect that this is gastroesophageal     reflux disease.  I do not suspect acute coronary syndrome. 2. Hypertension. 3. Abnormal D-dimer probably secondary to GI bleed.  No history to     suggest shortness of breath, desaturation.  CT was negative for     pulmonary embolism.  EKG does not reveal any right ventricular     strain.  RECOMMENDATIONS: 1. Hypertension, uncontrolled, needs better control. 2. History of coronary artery disease and history of cardiac     catheterization and a non drug-eluting stent implantation in August     of 2007, into the obtuse marginal one branch of the circumflex     coronary artery with in-stent restenosis leading to a drug-eluting     stent implantation at the same region with a 2.75 x 15 mm Promus     stent on December 20, 2007.  Repeat cardiac catheterization that was     done on April 24, 2008, revealed widely patent stent.  Normal LV     systolic function. 3. Hyperlipidemia, controlled.  RECOMMENDATIONS:  Would continue evaluation for noncardiac cause for chest pain.  We will obtain one more set of cardiac markers unless the troponin is increasing, would recommend GI evaluation or conservative therapy only.  If it is indeed increasing, I will make further input and I will follow up with the labs.  I will see her back in  the office in 2-3 weeks, however, if the troponins are increasing, I will certainly continue to follow the patient along with you.  I thank you for asking me to see this pleasant patient.  If you have any further questions, please do not hesitate to contact me any time.     Cristy Hilts. Jacinto Halim, MD     JRG/MEDQ  D:  12/14/2010  T:  12/15/2010  Job:  [id number]  cc:   [name], MD  Electronically Signed by Yates Decamp MD on 12/27/2010 12:00:28 PM

## 2011-01-07 ENCOUNTER — Ambulatory Visit: Payer: Medicare Other | Admitting: Internal Medicine

## 2011-01-25 NOTE — Discharge Summary (Signed)
Marissa [name], Marissa [name]              ACCOUNT NO.:  [id number]   MEDICAL RECORD NO.:  [id number]          PATIENT TYPE:  INP   LOCATION:  6703                         FACILITY:  [facility]   PHYSICIAN:  Thornton Park. Daphine Deutscher, MD  DATE OF BIRTH:  1940/12/01   DATE OF ADMISSION:  05/22/2008  DATE OF DISCHARGE:  05/28/2008                               DISCHARGE SUMMARY   ADMITTING PHYSICIAN:  Dr. Janee Morn.   DISCHARGING PHYSICIAN:  Thornton Park. Daphine Deutscher, MD   CONSULTANTCristy Hilts. Jacinto Halim, MD, with Cardiology.   PROCEDURES:  There were none.   REASON FOR ADMISSION:  Marissa [name] is a 70 year old black female who was  admitted to Dr. Jacinto Halim from Cardiology 1 month ago with unstable angina.  Since discharge, she has been having some mild generalized abdominal  pain.  The pain increased significantly earlier on the day of admission  and she developed nausea and vomiting.  Upon evaluation in the emergency  department, she was found to have a small bowel obstruction secondary to  a periumbilical incisional hernia.  At this time, an NG tube was placed  in the emergency department and the patient was feeling somewhat better.  Otherwise at this time, we were asked to see the patient for admission.  Please see admitting history of physical for further details.   ADMITTING DIAGNOSES:  1. Periumbilical incisional hernia with small-bowel obstruction.  2. Significant cardiac history including coronary artery disease,      congestive heart failure, hypertension, history of myocardial      infarction, and hyperlipidemia.   HOSPITAL COURSE:  After the patient was admitted, she was continued with  her NG tube.  On the first hospital day, Cardiology was consulted for  cardiac clearance in case the patient did not resolve her small-bowel  obstruction and the patient needed to go to the operating room.  A  followup abdominal x-ray was taken, which showed a resolving small-bowel  obstruction.  Over the next day of so, the  patient began to feel better  and was beginning to pass gas along with some several small bowel  movements.  Therefore, her NG tube was discontinued and she was started  on sips of clear liquids.  Over the next several days, the patient began  to improve and her diet was advanced.  On exam, her abdomen improved,  was soft, nontender, and nondistended with active bowel sounds with a  reducible periumbilical incisional hernia.  At this time because the  patient began to open up, surgery was no longer necessary upon this  admission; and therefore, once the patient was tolerating a soft diet,  the patient was felt stable for discharge and to follow up in our office  for elective repair.   DISCHARGE DIAGNOSES:  1. Periumbilical incisional hernia with small-bowel obstruction, which      is resolved.  2. Coronary artery disease.  3. Congestive heart failure.  4. Hypertension.  5. Myocardial infarction.  6. Hyperlipidemia.   DISCHARGE MEDICATIONS:  The patient may resume her:  1. Plavix 75 mg daily.  2. Simvastatin 40 mg daily.  3. Clonidine 0.1 mg daily.  4. Aspirin 81 mg daily.  5. Nifedipine 40 mg daily.  6. Toprol-XL 100 mg daily.  7. Nitroglycerin SL p.r.n.   DISCHARGE INSTRUCTIONS:  The patient informed that she may resume her  regular diet.  She is also informed that she may return to her normal  daily activities; however, she is informed to try to avoid lifting  anything too heavy to avoid issues with her hernia.  Otherwise, she is  informed that she may return to any of our physicians in our office, as  she has seen Dr. Ezzard Standing in the past for her prior laparotomy.  However,  they requested a card from me and said I can go to any one of these  doctors and I said that would be okay.  So, she is just to follow up in  our office with the physician of her choice.  Otherwise, she is informed  that if she begins to get any of the same symptoms she had prior to  admission including  abdominal pain, nausea, vomiting, inability to pass  flatus or have a bowel movement, she is to call our office to be seen  sooner than 2 or 3 weeks.      [name], PA      Thornton Park Daphine Deutscher, MD  Electronically Signed    KEO/MEDQ  D:  05/28/2008  T:  05/28/2008  Job:  [id number]   cc:   Cristy Hilts. Jacinto Halim, MD

## 2011-01-25 NOTE — Op Note (Signed)
NAMEAMBAR, RAPHAEL              ACCOUNT NO.:  [id number]   MEDICAL RECORD NO.:  [id number]          PATIENT TYPE:  INP   LOCATION:  5123                         FACILITY:  [facility]   PHYSICIAN:  Thornton Park. Daphine Deutscher, MD  DATE OF BIRTH:  [birth year]   DATE OF PROCEDURE:  05/30/2008  DATE OF DISCHARGE:                               OPERATIVE REPORT   PREOPERATIVE DIAGNOSIS:  Incarcerated ventral incisional hernia.   POSTOPERATIVE DIAGNOSIS:  Incarcerated sliding ventral incisional  hernia.   SURGEON:  Thornton Park. Daphine Deutscher, MD   ASSISTANT:  [name], PA   ANESTHESIA:  General endotracheal.   DESCRIPTION OF PROCEDURE:  Ms. [name] was discharged from the  hospital a few days ago opened to have an elective repair of ventral  incisional hernia.  She returned today with about a 10-hour history of  nausea, vomiting, and abdominal pain in her ventral incisional hernia  region.  She was told to come to the emergency room where she was taken  directly to the operating room.  She previously got the cardiac  clearance.   After induction, the abdomen was prepped with Techni-Care and draped  sterilely.  I excised her previous scar around her umbilicus.  I had  reviewed her CT scan and could see the loops of bowel that were just  above the umbilical defect.  I encountered a hernia sac and dissected  free and found that it contained a portion of small bowel and a portion  of the wall anteriorly.  I had to dissect around this and then take that  down and ultimately reducing it around the perimeter.  With the  perimeter reduction, I went ahead and repaired this defect with a piece  of Ventralex mesh which was about 3.2 cm in diameter.  This provided  sufficient overlap and I used about 12 0 Prolenes around the perimeter,  anchoring it at the superior and inferior portions with full-thickness  horizontal mattresses going through and through the mesh and then  laterally suturing the  Prolene sutures through the Prolene mesh allowing  it to be deployed in a circular fashion.  The wound was then irrigated  and looked nice.  No bleeding was noted.  I closed over with some 2-0  Vicryls and then we closed the skin with vertical mattress sutures of 4-  0 nylon and with some staples.  The patient tolerated the procedure well  and was taken to recovery room in satisfactory condition.      Thornton Park Daphine Deutscher, MD  Electronically Signed     MBM/MEDQ  D:  05/30/2008  T:  05/31/2008  Job:  [id number]

## 2011-01-25 NOTE — Cardiovascular Report (Signed)
NAMELEANDRA, Marissa [name]              ACCOUNT NO.:  [id number]   MEDICAL RECORD NO.:  [id number]          PATIENT TYPE:  OIB   LOCATION:  6532                         FACILITY:  [facility]   PHYSICIAN:  Darlin Priestly, MD  DATE OF BIRTH:  03-05-1941   DATE OF PROCEDURE:  12/20/2007  DATE OF DISCHARGE:                            CARDIAC CATHETERIZATION   PROCEDURE:  1. Left heart catheterization.  2. Coronary angiogram.  3. Left ventriculogram.  4. OM-1.  - Percutaneous transluminal coronary balloon angioplasty.  - Placement of new coronary stent.   ATTENDING:  Darlin Priestly, M.D.   COMPLICATIONS:  None.   INDICATION:  Ms. Timmons is a 70 year old female patient of Dr. Gabriel Earing with history of hypertension, history of renal insufficiency,  history of catheterization in August 2007, with 90% stenosis in the  ostial first diagonal, as well as 90% stenosis of the first OM in the  proximal portion, as well as 90% lower bifurcation lesion.  We did place  2 non-DES stents in the mid and proximal OM, and she did well following  that.  Recently, she has complained of exertional angina, which is nitro  responsive.  She was noted to have new inferior T-wave inversions.  She  is now brought for repeat catheterization to reassess her CAD.   DESCRIPTION OF OPERATION:  After informed consent,  the patient was  brought to the cardiac cath lab.  Left groin was shaved, prepped and  draped in the usual sterile fashion.  __________ monitor was  established.  Using modified Seldinger technique, #6 French arterial  sheath inserted in the left femoral artery.  A 6-French diagnostic  catheter was used to perform diagnostic angiography.   The left main is a large vessel with no significant disease.   The LAD is a large vessel which courses __________ to 2 diagonal  branches.  The LAD has mild irregularities but no high-grade stenosis.   First and second diagonals are small-to-medium size  vessels, both with  70% ostial lesion.   Left circumflex is a medium-size vessel, coursing to the AV groove and  gives rise to 2 obtuse marginal branches.  AV groove circumflex has no  significant disease.   The first OM is a large vessel which bifurcates in its mid segment.  There is a stent noted in the proximal portion of the OM.  The lower  branch also has a stent after the takeoff of the upper branch.  In the  proximal stent, there is approximately 50% in-stent restenosis.  In the  lower stent, there is 99% in-stent restenosis.  There is 40% scattered  disease beyond the lower branch stent.   The second OM is a small-to-medium size vessel with no significant  disease.   The right coronary is a large vessel which is dominant, gives rise to  PDA as well as posterolateral branch.  The RCA has 50% proximal mid  stenosis.  The PDA and posterolateral branch are small vessels with no  significant disease.   Left ventriculogram was observed with EF of 60%.   HEMODYNAMIC:  Systemic  arterial pressure 169/77, LV systolic pressure  was 169/0, LVEDP of 20.   INTERVENTIONAL PROCEDURE:  OM-1 - lower branch:  Following diagnostic  angiography, a #6 [place] JL-4 guiding catheter was coaxially engaged in  the left coronary ostium.  Next a 0.014 ATW marker wire was advanced  down the guiding catheter and positioned to the distal lower branch of  the OM.  We then attempted to cross using a 2.5 x 6 mm cutter, however,  this was unsuccessful in crossing the lesion.  This was then removed and  a 2.5 x 15-mm Maverick balloon was then used to successfully cross the  lesion.  Two inflations of maximum 10 atmospheres were performed for a  total of 38 seconds.  Follow-up angiogram revealed good luminal gain.  This balloon was removed and a Promus 2.75 x 15-mm stent was then placed  within the previous 2.75 x 12 mm driver stent.  ThePromusstent was then  deployed to 8 atmospheres for a total of 22  seconds.  A second inflation  to 8 atmospheres was performed for a total of 20 seconds.  Follow-up  angiogram revealed excellent luminal gain.  There was minimal  encroachment on the upper branch of the OM.  This stent balloon was  removed and a Quantum Maverick 2.75 x 12-mm balloon was then positioned  in the stent and two overlapping inflations to maximum 12 atmospheres  were performed for a total of 32 seconds.  Follow-up angiogram revealed  excellent luminal gain with no evidence of dissection or thrombus and  TIMI III flow to distal vessel.   Final orthogonal angiograms revealed less than 10% residual stenosis in  the in-stent restenotic area with TIMI III flow to distal vessel.  At  this point, we elected to conclude the procedure.  All balloons, wires,  and catheters were removed.  Angiomax was discontinued.  The patient was  transferred to the recovery room in stable condition.   CONCLUSION:  1. Successful percutaneous transluminal coronary balloon angioplasty      and placement of aPromus 2.75 x 15-mm stent within the previous in-      stent restenotic lower OM branch.  2. She will have adjuvant use of Angiomax infusion.       Darlin Priestly, MD  Electronically Signed     RHM/MEDQ  D:  12/20/2007  T:  12/20/2007  Job:  [id number]   cc:   Gabriel Earing, M.D.

## 2011-01-25 NOTE — Discharge Summary (Signed)
Marissa [name], Marissa [name]              ACCOUNT NO.:  [id number]   MEDICAL RECORD NO.:  [id number]          PATIENT TYPE:  INP   LOCATION:  4741                         FACILITY:  [facility]   PHYSICIAN:  Cristy Hilts. Jacinto Halim, MD       DATE OF BIRTH:  10/01/1940   DATE OF ADMISSION:  04/22/2008  DATE OF DISCHARGE:  04/24/2008                               DISCHARGE SUMMARY   DISCHARGE DIAGNOSES:  1. Unstable angina, catheterization this admission showing no      significant in-stent restenosis of the previously placed circumflex      stent.  2. Coronary artery disease with prior percutaneous coronary      intervention and circumflex stenting.  3. Treated hypertension.  4. Treated dyslipidemia.  5. Peripheral vascular disease with anterior tibial occlusion, which      is chronic and posterior peroneal occlusion with collaterals.  6. Preserved left ventricular function with an ejection fraction of      60%.   HOSPITAL COURSE:  The patient is a 70 year old female followed by Dr.  Jenne Campus previously with a history of coronary artery disease and  multiple PCI and stents.  Last catheterization was 01-13-2008.  She  apparently had a drug-eluting stent done for in-stent restenosis.  She  presented on April 22, 2008, with chest pain and shortness of breath,  worrisome for unstable angina.  She also had a frontal headache.  She  was admitted to telemetry.  CK-MBs and troponins were obtained were  negative.  Urine was positive for nitrites and leukocytes, and urine  culture is pending.  EKG showed some nonspecific T-wave changes, which  appeared be more pronounced from her May 03, 2009EKG.  She was put on  heparin and nitrates and set up for diagnostic catheterization, which is  done on April 24, 2008, by Dr. Jacinto Halim.  This revealed an EF of 60%.  She  has multiple stents in her circumflex.  There was at most a 30%  narrowing.  The LAD was normal, and RCA had a 30-40% narrowing.  Dr.  Jacinto Halim feels she could be  discharged later on April 24, 2008.   LABORATORY DATA:  White count 5.2, hemoglobin 11.4, hematocrit 36.1, and  platelets 231.  Sodium 138, potassium 3.4, BUN 17, and creatinine 1.25.  CK-MB and troponins were negative.  LDL 87 and HDL 54.  BNP is 55.  D-  dimer is 1.49.  TSH 2.03.  Venous Dopplers showed no DVT.  CT scan of  the chest shows no pulmonary embolism.  Chest x-ray shows mild  cardiomegaly.  CT of the head, no intracranial abnormality.  Urine  culture is pending at the time of this dictation.   DISCHARGE MEDICATIONS:  1. Aspirin 81 mg 2 tablets a day.  2. Plavix 75 mg a day.  3. Simvastatin 40 mg a day.  4. Clonidine 0.1 mg b.i.d.  5. Sular 40 mg a day.  6. Toprol-XL 100 mg a day.  7. Cipro 250 mg b.i.d. for 2 days.  8. Nitroglycerin sublingual p.r.n.   DISPOSITION:  The patient is discharged in  stable condition.  She has an  appointment to see Dr. Jacinto Halim in followup on May 12, 2008, at 2:30.  We will need to follow up her urine culture at that time to make sure  she is not resistant to Cipro.      Abelino Derrick, P.A.      Cristy Hilts. Jacinto Halim, MD  Electronically Signed    LKK/MEDQ  D:  04/24/2008  T:  04/25/2008  Job:  [id number]   cc:   Cristy Hilts. Jacinto Halim, MD

## 2011-01-25 NOTE — Cardiovascular Report (Signed)
NAMEPAULETTA, PICKNEY              ACCOUNT NO.:  [id number]   MEDICAL RECORD NO.:  [id number]          PATIENT TYPE:  INP   LOCATION:  4741                         FACILITY:  [facility]   PHYSICIAN:  Cristy Hilts. Jacinto Halim, MD       DATE OF BIRTH:  [birth year]   DATE OF PROCEDURE:  04/24/2008  DATE OF DISCHARGE:  04/24/2008                            CARDIAC CATHETERIZATION   PROCEDURE PERFORMED:  1. Left ventriculography.  2. Selective right and left coronary arteriography.   INDICATIONS:  Ms. Sheri Gatchel is a 70 year old female with known  coronary artery disease.  She had undergone PTCA and stenting of the  body of the obtuse marginal 1 with implantation of a 3.0 x 15 mm Driver  stent and into the secondary lower branch, a 2.74 x 12 mm Driver stent  was implanted on April 19, 2006.  At that time, she had a stent jailed  superior branch of the obtuse marginal, which was balloon angioplastied  only with excellent results.  She had come back with recurrent chest  pain and underwent repeat angiography and this had revealed the 2.75 x  12 mm Driver stent that was placed in the inferior limb of the obtuse  marginal branch, had restenosed and this was restented with a 2.75 x 15  mm Promus stent on December 20, 2007.  Now, she is readmitted back to the  hospital with recurrent chest pain.  Because of her complex coronary  anatomy, she is now referred for cardiac catheterization to evaluate for  progression of coronary disease and to evaluate patency of her stents.   HEMODYNAMIC DATA:  The left ventricular pressure was 131/70 with end-  diastolic pressure of 15 mmHg.  The aortic pressure was 128/67 with the  mean of 92 mmHg.  There was no pressure gradient across the aortic  valve.   ANGIOGRAPHIC DATA:  Left ventricle.  Left ventricular systolic function  was normal with the ejection fraction of 60%.  There was no significant  mitral regurgitation.   Right coronary artery.  Right coronary artery is a  large-caliber vessel.  Proximal and mid segment of the RCA has 30-40% luminal irregularity,  which are unchanged from prior cardiac catheterization.   Left main coronary artery.  Left main coronary artery is large-caliber  vessel, smooth, and normal.   Circumflex coronary artery.  Circumflex coronary artery is a large-  caliber vessel in the proximal segment.  It continues in the AV groove  after giving origin to a large obtuse marginal 1.   This obtuse marginal 1 has a superior branch and an inferior branch.  The previously placed stent in the body of the obtuse marginal, that is,  3.0 x 15-mm Driver on April 19, 2006, is widely patent with very minimal  luminal haziness noted only in one view.  The inferior limb of this  obtuse marginal branch which has a sandwich stent, that is, a 2.75 x 12  mm Driver and a 1.30 x 15 mm Promus placed on April 19, 2006, and December 20, 2007, respectively for in-stent restenosis is widely patent.  The  superior branch, which was previously stent jailed during first  angioplasty is widely patent with mild luminal irregularity constituting  20-30% stenosis.  There is brisk flow noted in this.   LAD.  LAD is a large-caliber vessel.  It gives origin to two small  diagonals.  It is widely patent.   IMPRESSION:  Patent stents in the circumflex branches without any in-  stent restenosis thrombus or haziness.   RECOMMENDATIONS:  The patient will be continued with aggressive risk  modification and secondary prevention of coronary artery disease.  She  can be discharged home in the morning.   TECHNIQUE OF THE PROCEDURE:  Under usual sterile precautions using a 6-  French right femoral arterial access, a 6-French multipurpose B2  catheter was advanced into the ascending aorta and then to the left  ventricle.  Left ventriculography was performed both in LAO and RAO  projection.  Catheter pulled into the ascending aorta.  Right coronary  artery selectively  engaged angiography was performed.  Then, the  catheter was pulled out of the body and a 6-French Judkins left 4  diagnostic catheter was utilized and gave left main coronary artery and  angiography was performed.  Catheter was then pulled out of the body  over a J-wire.  The patient tolerated the procedure well.  There were no  immediate complications.      Cristy Hilts. Jacinto Halim, MD  Electronically Signed     JRG/MEDQ  D:  04/24/2008  T:  04/25/2008  Job:  [id number]   cc:   Nanetta Batty, M.D.  Gabriel Earing, M.D.

## 2011-01-25 NOTE — Discharge Summary (Signed)
Marissa [name], Marissa [name]              ACCOUNT NO.:  [id number]   MEDICAL RECORD NO.:  [id number]          PATIENT TYPE:  INP   LOCATION:  5123                         FACILITY:  [facility]   PHYSICIAN:  Almond Lint, MD       DATE OF BIRTH:  [birth year]   DATE OF ADMISSION:  05/30/2008  DATE OF DISCHARGE:  06/03/2008                               DISCHARGE SUMMARY   ADMITTING PHYSICIAN:  Molli Hazard B. Daphine Deutscher, MD.   DISCHARGING PHYSICIAN:  Almond Lint, MD.   PROCEDURES:  Repair of incarcerated sliding ventral incisional hernia.  This was done on May 30, 2008, by Dr. Daphine Deutscher.  There were no  consultants.   REASON FOR ADMISSION:  Marissa [name] is a 70 year old black female who was  recently admitted on May 23, 2008, with nausea, vomiting, and  abdominal pain.  At that time, she was found to have a periumbilical  incisional hernia containing small bowel, which was causing a secondary  small bowel obstruction.  This resolved during that admission and was  later sent home.  However, after several days at home, she began having  the same symptoms of nausea and vomiting, and abdominal pain again, and  therefore presented back to the emergency department.  At that time, we  saw the patient and felt that since this was a recurrent problem, she  needed to go ahead and have this hernia fixed and therefore at this time  was admitted for repair.  Please see admitting history and physical for  further details.   ADMITTING DIAGNOSES:  1. Recurrent periumbilical incisional hernia.  2. Coronary artery disease.  3. Congestive heart failure.  4. Hypertension.  5. Myocardial infarction.  6. Hyperlipidemia.   HOSPITAL COURSE:  From the emergency department the patient was taken  straight up to the operating room where a repair of an incarcerated  sliding ventral hernia was completed.  The patient tolerated this  procedure well and on postoperative day #1, the patient was complaining  of little bit  of nausea and soreness, otherwise feeling okay.  At this  time, she was encouraged to get out of bed and was started on sips of  clear liquids.  The next several days, the patient began to have flatus  and had a normal postoperative course as her diet was advanced as  tolerated.  By postoperative day #4, the patient was tolerating a  regular heart-healthy diet with minimal tenderness and active bowel  sounds in her belly.  At this time, her incision was clean, dry, and  intact with staples as well as vertical mattress sutures in place.  At  this time, the patient was felt stable for discharge home.   DISCHARGE DIAGNOSES:  1. Incarcerated sliding ventral incisional hernia.  2. Repair of incarcerated sliding ventral incisional hernia.  3. Coronary artery disease.  4. Congestive heart failure.  5. Hypertension.  6. Myocardial infarction.  7. Hyperlipidemia.   DISCHARGE MEDICATIONS:  The patient is informed that she may resume all  home medications, which include Plavix 75 mg daily, simvastatin 40 mg  daily, clonidine 0.1 mg daily,  aspirin 81 mg daily, nifedipine 40 mg  daily, Toprol-XL 100 mg daily and nitroglycerin sublingual as needed.  She is also given a prescription for Percocet 5/325 q.4 h. as needed for  pain.   DISCHARGE INSTRUCTIONS:  The patient is informed that she needs to be  out of work for at least 3 weeks until she can return to see Dr. Daphine Deutscher  in followup and he can give her more definitive return to work  instructions.  Otherwise, she is not to lift anything heavy for  approximately 4-6 weeks greater than 10 or 15 pounds.  The patient does  not drive, so she does not have any driving restrictions.  Otherwise,  she is to increase her activity slowly.  She may walk up steps.  She is  also informed that she may resume any prehospital diet.  Otherwise, she  is to call our office if she begins to get worsening abdominal pain,  fever greater than 101.5, or redness or  pus-like drainage from any of  her incisions.  She is to return to the [facility] admitted to clinic  next week on June 10, 2008, at 3:30 p.m. for staple removal.  Otherwise, she is to call our office to set up an appointment with Dr.  Daphine Deutscher for 3 weeks from now which will be 2 weeks after her visit with  me for staple removal.      Marissa Cape, PA      Almond Lint, MD  Electronically Signed    KEO/MEDQ  D:  06/03/2008  T:  06/04/2008  Job:  [id number]   cc:   Cristy Hilts. Jacinto Halim, MD  [place] Daphine Deutscher, MD

## 2011-01-25 NOTE — H&P (Signed)
Marissa [name], Marissa [name]              ACCOUNT NO.:  [id number]   MEDICAL RECORD NO.:  [id number]          PATIENT TYPE:  INP   LOCATION:  1832                         FACILITY:  [facility]   PHYSICIAN:  Marissa Dare. Janee [name], M.D.DATE OF BIRTH:  02/05/1941   DATE OF ADMISSION:  05/22/2008  DATE OF DISCHARGE:                              HISTORY & PHYSICAL   CHIEF COMPLAINT:  Nausea, vomiting, and abdominal pain.   HISTORY OF PRESENT ILLNESS:  Marissa [name] is a pleasant 69 year old  African American female who was admitted to Dr. Jacinto Halim from Cardiology 1  month ago for unstable angina.  Since discharge, she has been having  some mild generalized abdominal pain.  The pain increased significantly  earlier today and she developed nausea and vomiting.  She came to the  emergency department for evaluation.  Workup included plain x-ray  showing small bowel obstruction.  CT scan of the abdomen and pelvis was  done, this shows a periumbilical incisional hernia containing small  bowel and causing small bowel obstruction.  There is no free air.  The  patient feels somewhat better since placement of a nasogastric tube in  the emergency department.  We were asked to evaluate the patient for  admission and treatment.   PAST MEDICAL HISTORY:  1. Coronary artery disease.  2. Congestive heart failure.  3. Hypertension.  4. Myocardial infarction.  5. Hyperlipidemia.   PAST SURGICAL HISTORY:  1. Cholecystectomy.  2. Appendectomy.  3. Repair of right femoral artery.  4. Left colectomy done in 2003 by my partner Dr. Ovidio Kin that was      for a perforated diverticulitis.   ALLERGIES:  No known drug allergies.   SOCIAL HISTORY:  She does not smoke, does not drink alcohol, and she  does not use drugs.   MEDICATIONS:  1. Plavix 75 mg daily.  2. Simvastatin 40 mg daily.  3. Toprol-XL 100 mg daily.  4. Clonidine 0.1 mg b.i.d.  5. Aspirin 81 mg daily.  6. Nisoldipine 40 mg daily.  7. Nitroglycerin  sublingually p.r.n.   REVIEW OF SYSTEMS:  GI:  As above.  CARDIAC:  She has no current chest  pain.  PULMONARY:  No shortness of breath.  GU:  No complaints.  CONSTITUTIONAL:  No fevers or other issues.  Remainder of the review of  systems is unremarkable.   PHYSICAL EXAMINATION:  VITAL SIGNS:  Temperature 98.0, pulse 76,  respirations 20, and blood pressure 144/82.  GENERAL:  She is awake and alert.  She is in no acute distress.  HEENT:  Nasogastric tube is in place.  Pupils are equal and reactive.  Oral mucosa is moist.  NECK:  Supple with no tenderness.  PULMONARY:  Lungs are clear to auscultation.  No wheezing is heard.  Respiratory effort is good.  CARDIOVASCULAR:  Heart is regular.  No murmurs are heard.  ABDOMEN:  Mildly distended.  She has a midline scar.  She has a hernia  felt near her umbilical region, which seems to be reducible.  It is  mildly tender with reduction.  There is no generalized abdominal  tenderness.  There is no peritoneal signs.  Bowel sounds are present.  EXTREMITIES:  Distal pulses are 1+ with mild peripheral edema.  SKIN:  Warm and dry.  No rashes are noted.  NEUROLOGIC:  She is alert and oriented.  Moves all extremities and  follows commands.   LABORATORY STUDIES:  CT scan of the abdomen and pelvis as described  above with the plain films of the abdomen as described above.  White  blood cell count 5.8 and hemoglobin 13.6.  Sodium 139, potassium 4.6,  chloride 113, CO2 29, BUN 29, creatinine 1.5, and glucose 81.  Liver  function tests are within normal limits.  Lipase is 11.   IMPRESSION:  1. Periumbilical incisional hernia with small bowel obstruction.  2. Significant cardiac history.   PLAN:  We will admit the patient in the hospital and try IV fluids, then  continue NG tube decompression.  We will obtain Cardiology consult and I  discussed this with the nurse practitioner from [facility].  We will plan for Cardiology  evaluation and then subsequently  we will plan to repair this hernia during this admission.  Plan was  discussed in detail with the patient and her daughter.  Questions were  answered.      Marissa [name], M.D.  Electronically Signed     BET/MEDQ  D:  05/23/2008  T:  05/23/2008  Job:  [id number]   cc:   Darlin Priestly, MD  [facility]

## 2011-01-25 NOTE — H&P (Signed)
NAMEREAH, Marissa              ACCOUNT NO.:  [id number]   MEDICAL RECORD NO.:  [id number]          PATIENT TYPE:  INP   LOCATION:  5123                         FACILITY:  [facility]   PHYSICIAN:  Thornton Park. Daphine Deutscher, MD  DATE OF BIRTH:  1940-11-09   DATE OF ADMISSION:  05/30/2008  DATE OF DISCHARGE:                              HISTORY & PHYSICAL   CHIEF COMPLAINT:  Umbilical pain with nausea and vomiting.   HISTORY OF PRESENT ILLNESS:  Ms. Marissa [name] is a 70 year old very pleasant  black female who was just recently admitted on May 23, 2008, with  nausea, vomiting, and abdominal pain.  At that time, she was found to  have a periumbilical incisional hernia containing small bowel which was  causing a small bowel obstruction.  She was kept here for the duration  of her stay as these symptoms seemed to improve and by the time of  discharge, she was tolerating a soft diet.  At that time, she was sent  home and told to follow up with Dr. Daphine Deutscher in 2-3 weeks for an elective  repair of this hernia.  However, once the patient got home, apparently  over the last couple of days that she has been eating, she has once  again developed nausea, vomiting, and abdominal pain.  Therefore at this  time, she has presented herself back to the emergency department where  we came down to see the patient.  Currently, she states that she is once  again having abdominal pain with nausea and vomiting.  She states that  at this time, she still is passing some flatus and had a bowel movement  yesterday.  At this time, she denies any chest pain, shortness of  breath, fevers, or chills.   REVIEW OF SYSTEMS:  Please see HPI, otherwise all other systems are  negative.   FAMILY HISTORY:  Noncontributory.   PAST MEDICAL HISTORY:  1. Coronary artery disease.  2. Congestive heart failure.  3. Hypertension.  4. Myocardial infarction.  5. Hyperlipidemia.   PAST SURGICAL HISTORY:  1. Cholecystectomy.  2.  Appendectomy.  3. Repair of right femoral artery.  4. Left colectomy done in 2003 by Dr. Ezzard Standing that was for a perforated      diverticulitis.   SOCIAL HISTORY:  The patient does not smoke or drink alcohol, and she  does not use any illicit drug.   ALLERGIES:  NKDA.   MEDICATIONS:  1. Plavix 75 mg daily.  2. Simvastatin 40 mg daily.  3. Toprol-XL 100 mg daily.  4. Clonidine 0.1 mg b.i.d.  5. Aspirin 81 mg daily.  6. Nisoldipine 40 mg daily.  7. Nitroglycerin sublingual p.r.n.   PHYSICAL EXAMINATION:  GENERAL:  Ms. Marissa [name] is a very pleasant well-  developed, well-nourished 70 year old black female who is currently  sitting in triage with mild abdominal pain.  VITAL SIGNS:  All stable.  Currently, I do not have any recent vital  signs; however at last check, the patient was afebrile with a normal  blood pressure, pulse, and respiration.  EYES:  Sclerae noninjected.  Pupils are equal, round,  and reactive to  light.  EARS, NOSE, MOUTH, and THROAT:  Ears and nose without any obvious masses  or lesions.  No rhinorrhea.  Mouth is pink and moist.  Throat shows no  exudate.  NECK:  Supple.  Trachea is midline.  No thyromegaly.  HEART:  Regular rate and rhythm.  Normal S1 and S2.  No murmurs,  gallops, or rubs are noted.  Plus 2 bilateral carotid, radial, and pedal  pulses.  LUNGS:  Clear to auscultation bilaterally with no wheezes, rhonchus, or  rales heard.  Respiratory effort is nonlabored.  ABDOMEN:  Soft.  Slightly mild distention.  Active bowel sounds and very  tender at her periumbilical region.  She does have a large midline scar  present from her prior surgery, and she does have a hernia that is  palpable near her umbilical region.  Otherwise, the patient at this time  does not have any peritoneal signs.  MUSCULOSKELETAL:  All 4 extremities are symmetrical with no cyanosis,  clubbing, or edema.  SKIN:  Warm and dry without any obvious masses, lesions, or rashes.  NEUROLOGIC:   Cranial nerves II through XII appear to be grossly intact.  Deep tendon reflex exam is deferred at this time.  PSYCHIATRIC:  The patient is alert and oriented with an appropriate  affect.   LABORATORY DATA AND DIAGNOSTICS:  As the patient was just released less  than 48 hours ago, we have not currently repeated any labs or  diagnostics.  However, upon discharge, CBC was 5,400, hemoglobin 10.7,  hematocrit 33.6, and platelet counts were 209,000.  Sodium 137,  potassium 4.3, BUN 13, and creatinine 1.07.   Last CT scan showed periumbilical ventral abdominal wall hernia  containing small bowel loops and causing a high-grade small bowel  obstruction.   IMPRESSION:  1. Recurrent periumbilical incisional abdominal wall hernia.  2. Coronary artery disease.  3. Congestive heart failure.  4. Hypertension.  5. Myocardial infarction.  6. Hyperlipidemia.   PLAN:  At this time, we will admit the patient and take her straight to  the operating room where we will do a laparotomy with a periumbilical  hernia repair.  At this time, we will also give the patient 1 g of Ancef  on-call to the OR.  Due to her prior history of major cardiac problems  upon the last admission, Dr. Jacinto Halim and his associates were consulted for  cardiac clearance.  This was obtained approximately 3 days ago, and  therefore, we are comfortable  proceeding with operative intervention with their blessing being so  short ago.  The patient as well as her sister have been discussed the  risks and benefits of this procedure, and Dr. Daphine Deutscher has answered all of  their questions.  At this time, they wish to continue with this  procedure.      Marissa Cape, PA      Thornton Park Daphine Deutscher, MD  Electronically Signed    KEO/MEDQ  D:  05/30/2008  T:  05/31/2008  Job:  [id number]   cc:   Cristy Hilts. Jacinto Halim, MD

## 2011-01-28 NOTE — Discharge Summary (Signed)
Marissa [name], Marissa [name]              ACCOUNT NO.:  [id number]   MEDICAL RECORD NO.:  [id number]          PATIENT TYPE:  INP   LOCATION:  2033                         FACILITY:  [facility]   PHYSICIAN:  Darlin Priestly, MD  DATE OF BIRTH:  1941-07-16   DATE OF ADMISSION:  04/18/2006  DATE OF DISCHARGE:  04/25/2006                                 DISCHARGE SUMMARY   DISCHARGE DIAGNOSES:  1. Chest pain, shortness of breath consistent with unstable angina.  2. Abnormal Cardiolite study.  3. Coronary disease by catheterization this admission revealing a 90%      obtuse marginal-1 stenosis in the mid vessel and distal vessel treated      with driver stenting.  4. Ejection fraction of 45%.  5. Right groin hematoma and pseudoaneurysm requiring operative repair by      Dr. Arbie Cookey on April 22, 2006.  6. Treated hypertension.  7. Chronic renal insufficiency.   HOSPITAL COURSE:  The patient is a 70 year old female with history of  chronic renal insufficiency, hypertension and increasing dyspnea and chest  pain as an outpatient.  She had a catheterization in 2005, revealing  noncritical disease.  Cardiolite as an outpatient suggested anterior wall  ischemia with an EF of 47%.  She was admitted on April 18, 2006, for  hydration and Mucomyst prior to catheterization.  We also held her ACE  inhibitor.  Her creatinine on admission was 1.1.  The patient underwent  diagnostic catheterization on April 19, 2006, by Dr. Jenne Campus.  This revealed  a 40% RCA, normal left main, 40% proximal circumflex, 90% mid the OM-1 and  70% distal OM-1 and a 90% small diagonal stenosis.  The OM mid and distal  sites were intervened on using a driver in a microdriver stent.  She  tolerated the procedure well.  She was kept on Integrilin 18 hours  postprocedure.  She did have some ecchymosis in her right groin.  The  patient also had been followed by Dr. Loreta Ave and was to have a CT of her  abdomen and pelvis at some point, but  we felt it would be best to hold off  on this since she has renal insufficiency and had just receive dye.  On  August 9, she developed increasing hematoma and pressure was held by the  nurse.  Right groin ultrasound revealed a large hematoma with an AV fistula.  CT scan showed hematoma with small pseudoaneurysm.  On August 11, her  hematoma had enlarged.  She did have blood loss anemia and was transfused 2  units.  The patient was seen by Dr. Elsie Lincoln on April 22, 2006, and it was  decided to proceed with CVTS evaluation.  Dr. Arbie Cookey took the patient to the  OR on April 22, 2006, and did an evacuation of the right groin with repair  of right common femoral artery.  The patient tolerated this well.  She did  have a drain placed for a couple of days.  We felt she could be discharged  on April 25, 2006.  Hemoglobin at discharge was 8.8.  We are  holding her  ACE inhibitor, this may be resumed as an outpatient at some point.  Her  creatinine remained stable.  The patient does work in a Surveyor, mining at  [employer] and she is not to return to work until cleared with  Dr. Elsie Lincoln.   DISCHARGE MEDICATIONS:  1. Coated aspirin once a day.  2. Plavix 75 mg a day.  3. Metoprolol 50 mg twice a day.  4. Simvastatin 40 mg a day.  5. Clonidine 0.1 mg twice a day.  6. Nitroglycerin sublingual p.r.n.  7. Will hold her lisinopril for now.   LABORATORY DATA AND X-RAY FINDINGS:  EKG shows sinus rhythm without acute  changes.  Chest x-ray shows no active lung disease.  CT of the abdomen shows  stable ventral abdominal hernia.  CT of the pelvis shows stranding in the  subcutaneous fat of the right anterior thigh consistent with hematoma.   White count 8.1, hemoglobin 8.8, hematocrit 26.1, platelets 236 at  discharge.  INR 0.9.  Sodium 138, potassium 4.1, BUN 12, creatinine 1.3 at  discharge.  Liver functions are normal.  CK-MB negative.  Troponins were  slightly elevated at 0.7 and 0.6.  Urinalysis  is unremarkable.  Blood type  was B positive, antibody negative   CONDITION ON DISCHARGE:  The patient discharged stable condition.   FOLLOW UP:  Will follow up with Dr. Elsie Lincoln and Dr. early.      Abelino Derrick, P.A.      Darlin Priestly, MD  Electronically Signed    LKK/MEDQ  D:  07/04/2006  T:  07/04/2006  Job:  [id number]   cc:   Larina Earthly, M.D.  Gabriel Earing, M.D.

## 2011-01-28 NOTE — H&P (Signed)
[place]. [facility]  Patient:    Marissa [name], Marissa [name]                       MRN: [id number] Adm. Date:  [id number] Disc. Date: [id number] Attending:  Wende Mott                         History and Physical  CHIEF COMPLAINT:  Painful and buckling right knee.  HISTORY OF PRESENT ILLNESS:  This is a 70 year old female whom I have been following in the office for internal derangement, synovitis of the right knee, for the past few months.  The patient has been treated with anti-inflammatory, warm compresses, and quad exercises. The patients condition progressively got worse with buckling, catching, and difficulty even walking short distances.  PAST MEDICAL HISTORY: 1. Tubal ligation. 2. Resection of sweat glands from both arm pits. 3. Cholecystectomy. 4. High blood pressure.  ALLERGIES:  No known drug allergies.  CURRENT MEDICATIONS: 1. Celebrex 200 mg q.d. 2. Triamterene hydrochlorothiazide 75/50 mg q.d.  SOCIAL HISTORY:  HABITS:  None.  FAMILY HISTORY:  Noncontributory.  REVIEW OF SYSTEMS:  Basically that in the history of present illness.  No cardiac or respiratory, no urinary or bowel symptoms.  PHYSICAL EXAMINATION:  VITAL SIGNS:  Temperature 98.5 degrees, pulse 76, respirations 16, blood pressure 150/80.  Height 5 feet 5 inches.  Weight 233 pounds.  HEENT:  Normocephalic.  Eyes:  Conjunctivae and sclerae clear.  NECK:  Supple.  CHEST:  Clear.  CARDIAC:  S1, S2 regular.  EXTREMITIES:  Right knee with +2 effusion.  Marked crepitus.  Palpable audible click in the lateral compartment, and patellofemoral joint.  Range of motion is limited with a lack of full extension.  Negative Homans test.  Some parapatellar tenderness with patellofemoral mild crepitus.  IMPRESSION: 1. Internal derangement, right knee. 2. Chronic synovitis, right knee. DD:  08/12/00 TD:  08/12/00 Job: [id number]

## 2011-01-28 NOTE — Op Note (Signed)
[place]. [facility]  Patient:    Marissa [name], Marissa [name]                       MRN: [id number] Proc. Date: 08/01/00 Adm. Date:  [id number] Disc. Date: [id number] Attending:  Wende Mott                           Operative Report  PREOPERATIVE DIAGNOSIS:  Internal derangement, right knee.  POSTOPERATIVE DIAGNOSIS:  Chronic synovitis with plica, loose body, and chondral defect of the lateral femoral condyle.  PROCEDURE:  Arthroscopic complete synovectomy with excision of plica, and removal of loose body, and chondroplasty of the lateral femoral condyle.  ANESTHESIA:  General.  SURGEON:  Kennieth Rad, M.D.  DESCRIPTION OF PROCEDURE:  The patient was taken to the operating room, after being given adequate preoperative medication, and given general anesthesia and intubated.  The right knee was prepped with DuraPrep and draped in a sterile manner.  A 1/2 inch puncture wound was made along the anterior, medial, and lateral joint line.  The inflow portal was through the medial suprapatellar pouch area.  Inspection of the joint revealed a tremendously hypertrophic synovial lining, both medial and lateral compartment, with a large plica being trapped between the patella and the femoral condyle.  This was completely resected, with also resection of the synovium in the suprapatellar pouch area. Next, a loose fragment was noted in the lateral compartment.  It was apparently peeled off the lateral femoral condyle.  A chondroplasty was done with smoothing down to good bleeding surface.  Copious irrigation with removal of the loose fragments.  Both medial and lateral menisci were intact.  The lateral meniscus did have some fraying around the edges.  The anterior cruciate was intact.  There was moderate patellofemoral chondromalacic changes of the patella.  Wound closure was then done with #4-0 nylon, and 20 cc of 0.5% Marcaine with epinephrine was injected into  the joint.  A compressive dressing was applied.  A knee immobilizer applied.  The patient tolerated the procedure quite well and went to the recovery room in stable and satisfactory condition.  DISPOSITION:  The patient is being discharged home with partial weightbearing on the right side, ice packs, elevation, with Percocet one q.4h. p.r.n. pain. To return to the office in one week. DD:  08/12/00 TD:  08/12/00 Job: [id number]

## 2011-01-28 NOTE — Op Note (Signed)
NAMETIMOTHY, TOWNSEL              ACCOUNT NO.:  [id number]   MEDICAL RECORD NO.:  [id number]          PATIENT TYPE:  INP   LOCATION:  2033                         FACILITY:  [facility]   PHYSICIAN:  Larina Earthly, M.D.    DATE OF BIRTH:  Mar 05, 1941   DATE OF PROCEDURE:  04/22/2006  DATE OF DISCHARGE:                                 OPERATIVE REPORT   SURGEON:  Larina Earthly, M.D.   ASSISTANT:  Nurse.   PREOPERATIVE DIAGNOSIS:  Expanding hematoma, right groin.   POSTOPERATIVE DIAGNOSIS:  Expanding hematoma, right groin.   PROCEDURE:  Evacuation of hematoma, repair of right common femoral artery.   ANESTHESIA:  General endotracheal.   COMPLICATIONS:  None.   DISPOSITION:  To recovery room, stable.   PROCEDURE IN DETAIL:  The patient was taken to the operating room and placed  in supine position, where the area of the right groin and right thigh were  prepped and draped in the usual sterile fashion.  The patient is 3 days  status post PTCA, and is on Plavix.  The incision was made over the puncture  site and carried down to enter a very large hematoma in the medial thigh.  The patient was obese.  Digital control was used to control the hole in the  artery, and the hematoma was evacuated.  The common femoral artery was  exposed and the bleeding site was seen as a single puncture in the  midportion of the common femoral artery.  The artery was occluded with a  partial occlusion clamp, and the hole in the artery was closed with a 5-0  Prolene suture.  Clamps were removed and hemostasis was obtained.  The  wounds were irrigated with saline.  Hemostasis with electrocautery.  A 19  Blake drain was passed through a separate stab incision in the thigh and  placed in the bed of the prior hematoma.  This was secured to the skin with  a 3-0 nylon stitch.  The groin was closed with several layers of 2-0 Vicryl  in the subcutaneous tissue, and the skin was closed with a 3-0 subcuticular  Vicryl  stitch.  A sterile dressing was applied and the patient was taken to  the recovery room in stable condition.      Larina Earthly, M.D.  Electronically Signed     TFE/MEDQ  D:  04/22/2006  T:  04/23/2006  Job:  [id number]

## 2011-01-28 NOTE — Procedures (Signed)
NAMELAHELA, WOODIN              ACCOUNT NO.:  [id number]   MEDICAL RECORD NO.:  [id number]          PATIENT TYPE:  OUT   LOCATION:  SLEEP CENTER                 FACILITY[facility]  [facility]   PHYSICIAN:  Clinton D. Maple Hudson, M.D. DATE OF BIRTH:  1941-06-29   DATE OF STUDY:  01/30/2005                              NOCTURNAL POLYSOMNOGRAM   REFERRING PHYSICIAN:  Dr. Adonis Housekeeper   DATE OF STUDY:  Jan 27, 2005   INDICATION FOR STUDY:  Hypersomnia with sleep apnea.  Epworth Sleepiness  Score 16/24, BMI 38, weight 225 pounds.   SLEEP ARCHITECTURE:  Total sleep time 381 minutes with sleep efficiency 72%.  Stage I was 12%, stage II 69%, stages III and IV 10% and REM was 8% of total  sleep time.  Sleep latency 17 minutes, REM latency 240 minutes, awake after  sleep onset 130 minutes.  Arousal index increased at 39.  No sleep  medication was recorded.   RESPIRATORY DATA:  Respiratory disturbance index (RDI, AHI) 9.9 obstructive  events per hour indicating mild obstructive sleep apnea/hypopnea syndrome.  This included 39 obstructive apneas and 24 hypopneas.  Technician indicates  she just missed qualifying for CPAP titration by split protocol on this  study night because she had difficulty maintaining sleep during the  necessary initial time period.  Events were not positional.  REM RDI 19.4.   OXYGEN DATA:  Loud snoring with oxygen desaturation to 88%.  Mean oxygen  saturation through the study was 97% on room air.   CARDIAC DATA:  Normal sinus rhythm.   MOVEMENT/PARASOMNIA:  A total of 85 limb jerks of which 24 were associated  with arousal or awakening for a periodic limb movement with arousal of 3.8  per hour which is mildly increased.   IMPRESSION/RECOMMENDATION:  1.  Mild obstructive sleep apnea/hypopnea syndrome, respiratory disturbance      index 9.9 per hour with loud snoring and oxygen desaturation to 88%.  2.  Consider return for continuous positive airway pressure titration or  evaluate for alternative therapies as      appropriate.  3.  Periodic limb movement with arousal, 3.8 per hour.      CDY/MEDQ  D:  01/30/2005 13:03:36  T:  01/30/2005 19:49:37  Job:  [id number]

## 2011-01-28 NOTE — Procedures (Signed)
Marissa [name], Marissa [name]              ACCOUNT NO.:  [id number]   MEDICAL RECORD NO.:  [id number]          PATIENT TYPE:  OUT   LOCATION:  SLEEP CENTER                 [facility]ACILITY:  [facility]   PHYSICIAN:  Clinton D. Maple Hudson, M.D. DATE OF BIRTH:  1941-01-07   DATE OF STUDY:  04/07/2005                              NOCTURNAL POLYSOMNOGRAM   REFERRING PHYSICIAN:  Manning Charity, MD   INDICATION FOR STUDY:  Hypersomnia with sleep apnea. BMI:  38. Weight 224  pounds. Baseline NPSG on Jan 30, 2005 recorded an RDI of 9.9 per hour. C-PAP  titration is requested.   SLEEP ARCHITECTURE:  Total sleep time 326 minutes with sleep efficiency 76%.  Stage I 4%, stage II 54%, stages III and IV 36%, REM 6% of total sleep time.  Sleep latency 5.5 minutes, REM latency 127 minutes, awake after sleep onset  98 minutes, arousal index 26.   RESPIRATORY DATA:  C-PAP titration protocol. C-PAP was titrated to 15 CWP,  RDI 2.1 per hour, using a small comfort gel mask with heated humidifier.   OXYGEN DATA:  Snoring with oxygen desaturation to a nadir of 92%. After C-  PAP control, saturation of 96-98% on room air.   CARDIAC DATA:  Normal sinus rhythm.   MOVEMENT/PARASOMNIA:  A total of 97 limb jerks were recorded of which 23  were associated with arousal or awakening for a periodic limb movement with  arousal index of 4.2 per hour which has increased. The patient had  significant hip pain, limping with ambulation to the bathroom. The patient  had atraumatic bleeding from left axilla and area of scar tissue from prior  sweat gland removal several years ago. The house supervising nurse was  contacted and the patient chose to be treated with application of a 4 x 4  gauze to complete the study and advised to have outpatient follow-up for  this problem.   IMPRESSION:  1.  Successful C-PAP titration to 15 centimeter of water pressure,      respiratory disturbance index 2.1 per hour, using a small comfort gel      mask with  heated humidifier.  2.  Baseline diagnostic NPSG on Jan 30, 2005 which reported a respiratory      disturbance index of 9.9 per hour.  3.  Significant hip pain interfering during ambulation to bathroom.  4.  Noted report of bleeding from axilla apparently related to previous      sebaceous gland surgery, for outpatient follow-up.      Clinton D. Maple Hudson, M.D.  Diplomat    CDY/MEDQ  D:  04/10/2005 15:56:37  T:  04/11/2005 16:10:96  Job:  [id number]

## 2011-01-28 NOTE — Op Note (Signed)
Marissa [name], Marissa [name]                          ACCOUNT NO.:  [id number]   MEDICAL RECORD NO.:  [id number]                   PATIENT TYPE:  INP   LOCATION:  0157                                 FACILITY:  [facility]   PHYSICIAN:  Sandria Bales. Ezzard Standing, M.D.               DATE OF BIRTH:  02-03-1941   DATE OF PROCEDURE:  08/22/2002  DATE OF DISCHARGE:                                 OPERATIVE REPORT   PREOPERATIVE DIAGNOSES:  Perforated left colon diverticulitis.   POSTOPERATIVE DIAGNOSES:  Extensive left colon diverticulitis.   PROCEDURE:  Left hemicolectomy with end-to-end left transverse colon to  sigmoid colon anastomosis, repair of incarcerated umbilical hernia,  appendectomy.   SURGEON:  Sandria Bales. Ezzard Standing, M.D.   FIRST ASSISTANT:  Currie Paris, M.D.   ANESTHESIA:  General endotracheal.   ESTIMATED BLOOD LOSS:  500 cc   DRAINS:  None.   INDICATIONS FOR PROCEDURE:  Marissa [name] was admitted to [facility] on  August 15, 2002 with a pneumoperitoneum felt to be secondary to a  perforated diverticulitis. She had a CT scan done at an outside facility  which showed free air and stranding and thickening of her colon/sigmoid  colon. She had been in the hospital since the 4th for six days now on IV  antibiotics with bowel rest and now comes for attempted open left  hemicolectomy.   DESCRIPTION OF PROCEDURE:  The patient placed in the lithotomy position. I  was really unsure how low this was going to go. She had an NG tube in place,  Foley catheter in place, PAS stockings in place and was on Cipro or Flagyl.  I then prepped her abdomen with Betadine solution and made a long midline  abdominal incision with sharp dissection carried down to the abdominal  cavity. Abdominal exploration carried out revealed the stomach in good  position. The spleen unremarkable, and both lobes of the liver unremarkable.  The gallbladder was absent. The small bowel was run from the ligament of  Treitz and  terminal ileum was unremarkable. Her colon was grossly abnormal  involving basically the entire left colon. This segment was probably 20-25  cm in length, extremely thick and highly suggestive of possible primary  colon cancer malignancy. The sigmoid colon was unremarkable. The uterus had  uterine fibroids and was enlarged. Both ovaries were unremarkable. I then  tried mobilizing the left colon. I found the left ureter. I divided the  sigmoid colon down at about the mid sigmoid junction, mobilized the colon  and mobilized the whole left colon up. I went around the splenic flexure,  took down the gastrocolic ligament and divided the colon just to the left of  midline. I took the mesentery using Kelly clamps and 2-0 silk ties and I  took some colonic specimens to _________ for a ________ smear. He called  back and said this actually looks advanced diverticulitis and sees  no  obvious mucosal lesion but will do further analysis of the mass. I then did  an end-to-end anastomosis using interrupted 2-0 silk suture. I buttressed  the anastomosis which was done and then tacked the sigmoid colon down to the  retroperitoneum. I then irrigated the abdomen out with 3-4 liters of warm  saline. I went in through her abdomen and I resected incarcerated hernia  which was maybe 1-2 cm in size. Now I turned my attention and did an  appendectomy and divided the mesentery of the appendix with 2-0 silk suture,  and the mesoappendix with the 0 chromic. I then did inverting pursestring  suture. The patient tolerated the procedure well. Again I irrigated the  abdomen with about 2 or 3 liters of saline at the end of the procedure and  closed it with two running #1 PDS sutures with an interrupted PDS suture. I  closed the skin with a skin graft and put some Telfa wicks in. We lost a  fair amount of blood mobilizing the colon but after the colon was mobilized  actually lost very little. I guess we lost between 3-500  cc. Labs pending at  the time of this dictation. Her sponge and needle count were correct at the  end of the case.                                               Sandria Bales. Ezzard Standing, M.D.    DHN/MEDQ  D:  08/22/2002  T:  08/22/2002  Job:  [id number]   cc:   [name], M.D.  [facility] [place]  [place] [address]  Fax: [phone]   Louanna Raw  [address]  [place]  [place] [address]  Fax: [phone]

## 2011-01-28 NOTE — Consult Note (Signed)
Marissa [name], Marissa [name]              ACCOUNT NO.:  [id number]   MEDICAL RECORD NO.:  [id number]          PATIENT TYPE:  INP   LOCATION:  2115                         FACILITY:  [facility]   PHYSICIAN:  Shirley Friar, MDDATE OF BIRTH:  02/18/1941   DATE OF CONSULTATION:  07/06/2005  DATE OF DISCHARGE:                                   CONSULTATION   REQUESTING PHYSICIAN:  Duncan Dull, M.D.   HISTORY OF PRESENT ILLNESS:  I am doing this consult at the request of Dr.  Duncan Dull on Marissa [name] for rectal bleeding.  Patient is a 70-year-  old black female with history of diverticulitis and subsequent left  hemicolectomy due to this diverticulitis in 2003.  She presented with acute  onset of painless rectal bleeding on July 05, 2005 that lasted all day.  She denied any syncope or lightheadedness, but came in due to the  persistence of the bleeding.  She also denied any pain during the episode.  She denies having a colonoscopy since her partial colonic resection.  She  denies any family history of colon cancer.  She was on no medicines prior to  admission.  She denied any NSAID use.   PAST MEDICAL HISTORY:  1.  As above.  2.  Hypertension.  3.  Anemia.  4.  Reflux.   MEDICATIONS ON ADMISSION:  None.   ALLERGIES:  No known drug allergies.   PHYSICAL EXAMINATION:  VITAL SIGNS:  Temperature 97.9, pulse 80s, blood  pressure 140s/70s, O2 saturations 100% on room air.  GENERAL:  Alert, oriented, no acute distress.  HEENT:  Non-icteric sclerae.  Oropharynx clear.  CHEST:  Clear to auscultation anteriorly.  CARDIOVASCULAR:  Regular rate and rhythm without murmurs.  ABDOMEN:  Mild tenderness on right side, otherwise nontender, soft,  nondistended.  No guarding.  No rebound.  No masses palpated.  Liver not  palpable.  EXTREMITIES:  No edema.   LABORATORIES:  White blood count 6.1, hemoglobin 9.6, hematocrit 29.7,  platelet count 297.  MCV 66, INR 1.  Sodium 136, potassium 3.5,  chloride  104, CO2 22, BUN 19, creatinine 1.2, glucose 82.  LFTs:  Tbili 0.4, ALP 83,  AST 20, ALT 21, albumin 3.9.  Troponin less than 0.05.  CK-MB 5.   ASSESSMENT:  A 70 year old black female with acute onset of pain with rectal  bleeding that is likely diverticular in origin, although it could be  arteriovenous malformations versus malignancy which is less likely.  I agree  with plan to do abdominal CT scan to rule out diverticulitis, although this  would be unusual without any report of pain during her acute episode.  If  her CAT scan is negative for diverticulitis then patient will need a  colonoscopy the following day to evaluate for diverticular source of her  bleeding and/or AVMs and malignancy.   PLAN:  1.  Continue aggressive volume resuscitation and transfusion as you are      doing.  2.  Plan on colonoscopy on July 07, 2005 if CAT scan negative for      diverticulitis.  3.  If diverticulitis noted on CAT scan then would recommend antibiotic      therapy and colonoscopy after clearing of infection.  4.  Will plan to do colonoscopy on July 07, 2005.  5.  Will follow.      Shirley Friar, MD  Electronically Signed     VCS/MEDQ  D:  07/06/2005  T:  07/06/2005  Job:  [id number]

## 2011-01-28 NOTE — Discharge Summary (Signed)
Marissa [name], Marissa [name]              ACCOUNT NO.:  [id number]   MEDICAL RECORD NO.:  [id number]          PATIENT TYPE:  INP   LOCATION:  5741                         FACILITY:  [facility]   PHYSICIAN:  Duncan Dull, M.D.     DATE OF BIRTH:  09-29-1940   DATE OF ADMISSION:  07/05/2005  DATE OF DISCHARGE:  07/08/2005                                 DISCHARGE SUMMARY   DISCHARGE DIAGNOSIS:  1.  Lower gastrointestinal bleeding secondary to diverticulosis.  2.  Hypertension.  3.  Anemia.  4.  Hypokalemia.   DISCHARGE MEDICATIONS:  1.  Norvasc 10 mg 1 tab p.o. daily.  2.  HCTZ 25 mg p.o. daily.  3.  Iron 325 mg 1 tab b.i.d.  4.  Colchicine 0.6 mg 1 tab b.i.d.   CONDITION ON DISCHARGE:  The patient was stable at the time of discharged.  She will be followed up by Dr. Silvestre Mesi on November 7 at the outpatient  clinic.  She needs a CBC to monitor her anemia and a BMP to monitor  hypertension.  She will also need repeat colonoscopy in five years.   PROCEDURES AND DIAGNOSTIC STUDIES:  1.  Colonoscopy done on July 08, 2005, revealed scattered sigmoid      diverticulosis, no active bleeding noted.  2.  CT scan of the pelvis and abdomen showed ventral hernia containing small      bowel without obstruction, colonic diverticulosis without evidence of      diverticulitis, and minimal diffuse fatty infiltration of the liver,      status post cholecystectomy, 4.9 cm calcified fibroid.  3.  Echocardiogram done on July 06, 2005, showed left ventricular      ejection fraction to be 60%, no evidence of left ventricular regional      wall abnormalities.  There was good left ventricular function and no      significant valvular abnormalities.   CONSULTATIONS:  Dr. Shirley Friar, gastroenterology.   HISTORY AND PHYSICAL:  Marissa [name] is a 70 year old African American female  with a past medical history of diverticulitis of the left colon with left  hemicolectomy in 2003, hypertension, GERD,  presented to the ER with five  episodes of bright red blood per rectum starting today.  She also complains  of exertional shortness of breath, denies palpitations, diaphoresis, light  headedness, dizziness, or blurred vision.  No history of fever or chills and  she has been having diarrhea for the last one week.   On examination, temperature 98, blood pressure 172/99, pulse 92, respiratory  rate 20, O2 saturation 100% on room air.  General:  Oriented x 3, no  abnormalities detected.  Eyes:  Pupils equal and reactive to light,  extraocular movements intact, no icterus.  ENT:  Clear, normal oropharynx.  Neck:  Supple, no JVD, no bruit, no lymphadenopathy.  Lungs clear to  auscultation bilaterally.  CVS:  Regular rate and rhythm, systolic ejection  murmur.  Abdomen:  Soft, nontender, nondistended, positive bowel sounds,  negative Murphy's, no rebound, no guarding.  Extremities:  2+ pedal edema.  Neurological:  Grossly intact, no  focal abnormalities.  Psychiatric  examination appropriate.   LABORATORY DATA:  Hemoglobin 11.1, hematocrit 34.9, WBC 7, platelets 329,  ANC 5.4, MCV 66.2.  Sodium 136, potassium 3.5, chloride 104, bicarb 22, BUN  19, creatinine 1.2, and glucose 82.  Bilirubin 0.4, alkaline phos 83, SGOT  20, SGPT 21, protein 7.1, albumin 3.9, and calcium 8.9.  Protime 12.9, INR  1, PTT 30.  FOBT positive.   HOSPITAL COURSE:  Problem 1:  Lower GI bleed secondary to diverticulosis.  Marissa [name] presented  with painless bright red blood per rectum.  Her initial hemoglobin was 11.1  which dropped to 9.6.  She was transfused with 2 units of packed RBCs and  rehydrated with IV fluids.  Rectal examination done revealed gross blood.  CT scan of the abdomen was done to rule out diverticulitis.  The CT scan  showed scattered diverticulosis mainly in the sigmoid colon.  This was  followed by colonoscopy which showed scattered sigmoid diverticulosis that  were not actively bleeding.  The  patient stabilized during the course of  hospitalization, the bleeding stopped, she was gradually transitioned to  solid foods and was discharged 48 hours after the bleeding stopped.  She was  sent home on a high fiber diet and encouraged to increase water intake as  tolerated.  She will need a repeat colonoscopy in five years.   Problem 2:  Hypertension.  At the time of admission, she had blood pressure  of 172/99, she was put on Norvasc and her blood pressure stabilized during  the course of the hospital stay.   Problem 3:  Anemia.  At the time of admission, her hemoglobin was 11.1 which  had dropped to 9.6 and she was also having active lower bleeding, so she was  transfused with 2 units packed RBCs.  Labs were drawn to work up the anemia.  Her RBC folate was 288 which was normal.  Her iron was 30 which was low.  Total iron binding capacity was 397 which is in the upper limits.  Percent  saturation was 8 and UIBC was 367.  She most likely had a component of iron  deficiency anemia.  At the time of discharge, she was put on iron  replacement therapy.   Problem 4:  Hypokalemia.  At the time of admission, her potassium was 3.5  which was replaced.  Her potassium corrected and at the time of discharge,  it was 3.7.  Her magnesium was 1.9 which was replenished during the  hospitalization.   At the time of discharge, her vital signs and labs were as follows:  Blood  pressure 151/67, pulse 83, respiratory rate 18, hemoglobin 11, WBC 6.7,  platelets 372.  Sodium 139, potassium 3.7, chloride 107, bicarb 23, BUN 4,  creatinine 1.9, and platelets 79, her calcium was 9.4.      Ronda Fairly, M.D.      Duncan Dull, M.D.  Electronically Signed    YB/MEDQ  D:  07/12/2005  T:  07/12/2005  Job:  [id number]   cc:   Manning Charity, MD  Fax: [phone]

## 2011-01-28 NOTE — Op Note (Signed)
Marissa [name], Marissa [name]              ACCOUNT NO.:  [id number]   MEDICAL RECORD NO.:  [id number]          PATIENT TYPE:  INP   LOCATION:  5741                         FACILITY:  [facility]   PHYSICIAN:  Shirley Friar, MDDATE OF BIRTH:  03-26-1941   DATE OF PROCEDURE:  07/08/2005  DATE OF DISCHARGE:  07/08/2005                                 OPERATIVE REPORT   PROCEDURE:  Colonoscopy.   INDICATIONS FOR PROCEDURE:  Rectal bleeding.   HISTORY:  70 year old black female with previous history of left  hemicolectomy due to diverticulitis in 2003 presented with the acute onset  of pain with rectal bleeding on July 05, 2005.  The patient is undergoing  this colonoscopy to evaluate for the source of his rectal bleeding which has  subsided.   ANESTHESIA:  Demerol 50 mg, Versed 4 mg.   FINDINGS:  Rectal exam was normal.  A pediatric adjustable colonoscope was  inserted and advanced to the cecum where the ileocecal valve and appendiceal  orifice were identified.  The terminal ileum was intubated and normal in  appearance.  Careful withdrawal of the colonoscope revealed no bleeding.  There was scattered, shallow diverticula noted in the sigmoid colon,  otherwise, this was a normal colonoscopy.  Retroflexion was normal, as well.   ASSESSMENT:  1.  Scattered sigmoid diverticulosis.  2.  No bleeding noted.  3.  I suspect the patient's bleeding was secondary to a diverticular bleed      that has resolved.   PLAN:  1.  High fiber diet.  2.  Increase water intake as tolerated.  3.  Repeat colonoscopy in five years due to fair prep.      Shirley Friar, MD  Electronically Signed     VCS/MEDQ  D:  07/08/2005  T:  07/08/2005  Job:  [id number]   cc:   Duncan Dull, M.D.  Fax: [phone]

## 2011-01-28 NOTE — Discharge Summary (Signed)
Marissa [name], Marissa [name]                          ACCOUNT NO.:  [id number]   MEDICAL RECORD NO.:  [id number]                   PATIENT TYPE:  INP   LOCATION:  0446                                 FACILITY:  [facility]   PHYSICIAN:  Sandria Bales. Ezzard Standing, M.D.               DATE OF BIRTH:  April 02, 1941   DATE OF ADMISSION:  08/15/2002  DATE OF DISCHARGE:  08/30/2002                                 DISCHARGE SUMMARY   DISCHARGE DIAGNOSES:  1. Severe diverticulitis of her left colon.  2. Postoperative wound infection.  3. 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  4. Hyperkalemia, corrected.  5. Hypertension.  6. History of C4, C5 fusion.  7. Reflux esophagitis.   OPERATIONS PERFORMED:  The patient had an extended left hemicolectomy,  repair of incarcerated umbilical hernia, and an appendectomy on 08/22/02.   HISTORY OF PRESENT ILLNESS:  The patient is a 70 year old black female who  is a patient of Dr. Louanna Raw, who has been seen recently by Dr. Ritta Slot.  Since the summertime she has had vague abdominal pain which she  described as periumbilical and cramping.  She lost approximately 30 pounds  over the last three to six months, but the exact nature of this weight loss  has been unclear.  In November, she apparently got pneumonia, and was  treated as an outpatient by Dr. Louanna Raw, got somewhat better.  She then  saw Dr. Ritta Slot on 07/25/02.  He arranged a colonoscopy on 07/29/02.  He  could not advance the colonoscopy beyond the splenic flexure, but he saw  evidence intraluminally of diverticulitis with purulent discharge within the  lumen of the bowel.  The patient was placed on Cipro.  He talked about  getting a CT, but for some reason she never got one.  He saw her back,  obtained a CT the day of her admission.  On the CT scan there is evidence of  a pneumoperitoneum, and she was sent to the emergency room for me to  evaluate.   ALLERGIES:  No known drug  allergies.   MEDICATIONS:  On admission, the family did not have her medications, but had  eventually been brought in, and what they brought was:  1. Prevacid 30 mg q.d.  2. Indomethacin 75 mg q.d.  3. Misoprostol 200 mg b.i.d.  4. Flagyl 250 mg b.i.d.  5. Triamterene 75/50.  6. Iron t.i.d.   REVIEW OF SYMPTOMS:  Significant as that the patient had hypertension for a  number of years, but never had any chest pain or cardiac evaluation.  She  has a history of gout.   PHYSICAL EXAMINATION:  VITAL SIGNS:  Her temperature is 98.2.  ABDOMEN:  Shows a well-healed lower midline incision.  She has some diffuse  tenderness, but certainly no significant peritoneal signs.   LABORATORY DATA:  Initial white blood cell count was 7200, with 77%  neutrophils.  Her hemoglobin was 8.   I reviewed her CT scan which was done at [facility].  This showed a  thickened left colon consistent with diverticulitis.  She did have a  pneumoperitoneum, but she had fairly minimal signs.  I thought it would be  worthwhile trying to wait, place her on IV antibiotics, keep her n.p.o., and  set her up as an elective colon operation.   HOSPITAL COURSE:  She was admitted, placed on Cipro and Flagyl.  She got  steadily better.  I discussed with her about surgery.  I treated her for  seven days with antibiotics before the operation.  I corrected hypokalemia  she had.  She also got 3 units of blood preoperatively in anticipation for  her surgery, and on 08/22/02, she was taken to the operating room.  She had  a significant diverticulitis of her left colon, actually at the time of  surgery I thought this was a possible colon cancer.  However, the frozen  section evaluation and gross examination really looked like diverticular  disease.  She also was found to have an umbilical hernia which I repaired,  and I did an appendectomy.  On her first day postoperative her hemoglobin  was 13, hematocrit 40, white blood cell  count 19,000.  Sodium 136, potassium  5.1, chloride 109, CO2 19, glucose 188, BUN 19, creatinine 1.3.  She did  well.  Her biggest complaint postoperative early was that of her feet  aching.  She has been told she had gout, but I do not think this was really  gout-like symptoms.  By the fifth postoperative day, she was afebrile.  She  was started on a diet, and advanced to a diet.  Unfortunately, on the sixth  postoperative day she had evidence of a wound infection.  I opened her wound  up, and started packing her wound.   She is now eight days postoperative.  Her wound has been packed for two  days.  We are arranging home health care to see her at home.  Her daughter  has also been instructed as far as how to change her dressing.  Her last  hemoglobin was 8.8 on 08/28/02, hematocrit 26, white blood cell count  11,500.  Her final pathology showed an extensive diverticulosis with areas  of diverticulitis and abscess formation and prominent fibrosis of her  sigmoid colon, but no malignancy.  Her appendix was felt to be normal.   She is now ready for discharge to resume her home medications.   DISCHARGE MEDICATIONS:  1. Vicodin for pain.  2. Over-the-counter vitamin with iron.  3. Resume her home medications.   ACTIVITY:  She should walk and be active.  She can shower twice a day.   WOUND CARE:  Home health care will come by daily and help change her  dressing.    FOLLOWUP:  She is to see me back in 7 to 10 days.  I removed the lower half  of her staples.  We will remove the upper half of her staples when I see her  back.   CONDITION ON DISCHARGE:  Good.                                               Sandria Bales. Ezzard Standing, M.D.    DHN/MEDQ  D:  08/30/2002  T:  08/31/2002  Job:  [id number]   cc:   [name], M.D.  [facility] [place]  [place] [address]  Fax: [phone]   Louanna Raw [address]  [place]  [place] [address]  Fax: [phone]

## 2011-01-28 NOTE — Cardiovascular Report (Signed)
NAMEMARRIANA, Marissa [name]                          ACCOUNT NO.:  [id number]   MEDICAL RECORD NO.:  [id number]                   PATIENT TYPE:  OIB   LOCATION:  6501                                 FACILITY:  [facility]   PHYSICIAN:  Salvadore Farber, M.D. [facility]         DATE OF BIRTH:  1941/02/14   DATE OF PROCEDURE:  04/06/2004  DATE OF DISCHARGE:                              CARDIAC CATHETERIZATION   PROCEDURE:  Left heart catheterization, left ventriculography, coronary  angiography, abdominal aortography with iliac runoff.   INDICATIONS FOR PROCEDURE:  The patient is a 70 year old lady who presents  with progressive chest pain occurring primarily with exertion over the past  year.  Due to classic symptoms and multiple risk factors, she was referred  directly for diagnostic angiography.  She also complains of bilateral hip  and calf discomfort with walking less than a block.  Therefore, abdominal  aortography is planned.   DESCRIPTION OF PROCEDURE:  Informed consent was obtained.  Under 1%  lidocaine local anesthesia, a 4 French sheath was placed in the right common  femoral artery using the modified Seldinger technique.  Diagnostic  angiography was performed using JL4 and JR4 catheters.  Left heart  catheterization and left ventriculography was performed using a pigtail  catheter.  The pigtail catheter was then pulled back to the suprarenal  abdominal aorta.  Abdominal aortography with runoff to both common femoral  arteries was performed by power injection.  During ventriculography, the  patient began to complain of severe substernal chest pain reminiscent of  pain which had prompted her presentation.  Therefore, repeat angiography of  the left system was repeated.  This demonstrated persistent TIMI 3 flow in  the entire left coronary territory.  12-lead electrocardiogram was repeated  which was normal and unchanged with electrocardiogram from April 03, 2004.  The pain then remitted  spontaneously, having lasted approximately 10  minutes.   COMPLICATIONS:  None.   FINDINGS:  1. LV; 140/11/10.  EF 65% without regional wall motion abnormality.  2. No aortic stenosis or mitral regurgitation.  3. Left main; angiographically normal.  4. LAD; moderate size vessel giving rise to two diagonals.  It is     angiographically normal.  5. Circumflex; large vessel giving rise to one large branching obtuse     marginal.  This marginal has a 40% stenosis proximally and the more     medial branch has a 30% stenosis and the more lateral branch a 50%     stenosis.  The proximal 40% stenosis is hazy and the severity of the     disease could be underestimated based on angiography.  6. RCA; moderate size, though, dominant vessel. There is a 30% stenosis of     the midvessel.  7. Abdominal aortography.  Normal abdominal aorta.  Normal common and     external iliacs bilaterally.  Both internal iliacs are patent.  There are  single renal arteries bilaterally.  The left is normal.  The right has     trivial (less than 20%) proximal stenosis.   IMPRESSION:  1. The patient has at least moderate circumflex disease.  During the     catheterization she had severe substernal chest     pain with normal flow documented in all vessels and a normal 12-lead     electrocardiogram.  Thus, her pain is not clearly related to her coronary     disease.  Will thus plan on checking an Adenosine Cardiolite.  2. Normal aorta, common, and external iliac vessels bilaterally.                                               Salvadore Farber, M.D. [facility]    WED/MEDQ  D:  04/06/2004  T:  04/06/2004  Job:  [id number]   cc:   Dr. Violeta Gelinas [facility].   Thomas C. Wall, M.D.

## 2011-01-28 NOTE — Cardiovascular Report (Signed)
[name], [name]              ACCOUNT NO.:  [id number]   MEDICAL RECORD NO.:  [id number]          PATIENT TYPE:  INP   LOCATION:  3741                         FACILITY:  [facility]   PHYSICIAN:  Darlin Priestly, MD  DATE OF BIRTH:  01-15-1941   DATE OF PROCEDURE:  04/19/2006  DATE OF DISCHARGE:                              CARDIAC CATHETERIZATION   PROCEDURES:  1. Left heart catheterization.  2. Coronary angiography.  3. Left ventriculogram.  4. OM1-mid.      a.     Percutaneous transluminal coronary angioplasty.      b.     Placed intracoronary stent.  5. OM1-distal.      a.     Placed intracoronary stent.   COMPLICATIONS:  None.   INDICATIONS:  [name] is a 70 year old female, patient of Dr. Gabriel Earing with a history of hypertension, history of renal insufficiency with  serum creatinine of 1.5-1.7, history of diverticulosis, history of right  cardiac catheterization in 2005 with significant circumflex and OM disease  at that time.  Recently, she has complained of increasing chest pain and  shortness of breath with a Cardiolite scan suggesting anterior wall ischemia  and EF of 47%.  She is now brought for repeat catheterization to reassess  her coronary anatomy.   DESCRIPTION OF OPERATION:  After informed consent, the patient was brought  to the cardiac cath lab.  The right and left groin were shaved, prepped and  draped in the usual sterile fashion.  ECG monitoring established.  Using  modified Seldinger technique, a #6 French arterial sheath inserted in the  right femoral artery.  A 6-French diagnostic catheter was used to perform  diagnostic angiography.   The left main is a large vessel with no significant disease.   The LAD is a medium-to-large sized vessel which courses the apex with 2  diagonal branches.  The LAD has mild irregularities in the proximal and  early mid segment up to 20% with mild calcification.  The remainder of the  LAD has no significant  disease.   First diagonal is a small vessel with 90% ostial lesion.   The second diagonal is a medium-sized vessel which bifurcates distally with  no significant disease.   Left circumflex is a medium-sized vessel which courses in the AV groove and  gives rise to 2 obtuse marginal branches. AV groove circumflex had 40%  proximal narrowing.   The first OM is a medium-to-large vessel which bifurcates in its mid  segment.  There is 90% mid OM lesion which is calcified.  The OM then  bifurcates with a 70% lesion in the lower bifurcation and mild 30% narrowing  in the upper part of the proximal bifurcation.   Second OM is small vessel with no significant disease.   The right coronary artery is medium-sized vessel which is dominant and gives  rise  to the __________ posterolateral branch.  There is mild 40% mid-RCA  narrowing.   Left ventriculogram reveals a mildly depressed EF of 45% with mild global  hypokinesis.   HEMODYNAMICS:  Systemic arterial pressure 195/92,  LV systolic pressure  191/19, LVDP of 20.   INTERVENTIONAL PROCEDURE:  1. OM1-mid:  Following diagnostic angiography, a #6 [place] guiding      catheter was then engaged in the left coronary ostium.  Next, a 0.014      [place] guide wire was advanced out of the guiding catheter and      positioned in the lower branch of the first OM without difficulty.      Next, a 2.75 x 12 mm Driver stent (non-DES) was then positioned across      the stenotic lesion and deployed to 8 atmospheres for 19 seconds.  A      second inflation to 9 atmospheres was performed for a total of 16      seconds.  Followup angiogram revealed good luminal gain of the OM,      though the upper branch became compromised with TIMI-I flow into the      upper branch.  We then placed a 3.0 x 15 mm Driver in the mid portion      of the OM1 which was deployed at 10 atmospheres.  A second inflation to      12 atmospheres was performed for 14 seconds;  however, it was noted to      be persistent wasting in the balloon.  This balloon was then exchanged      for a 3.0 x 8 Quantum Ranger, which was then positioned across the      wasted area of the stent.  Two inflations to a maximum of 20      atmospheres were performed for a total of 44 seconds.  Followup      angiogram revealed good luminal gain with no evidence of dissection or      thrombus.  We then turned our attention to the upper bifurcation      branch.  We took a 0.014 high torque floppy wire and prolapsed this      through the proximal stent and then we successfully wired the upper      branch of the OM.  We then took a 2.0 x 15 mm Voyager across the      stenotic lesion with 2 inflations to a maximum of 8 atmospheres.      Followup angiogram revealed TIMI-I to II flow in the first OM, though      there now appeared to be a filling defect in the more proximal stent.      This balloon is removed.  We then took a 3.0 x 12 mm Voyager into the      proximal portion of the stent with a second inflation to 10 atmospheres      for a total of 27 seconds.  Followup angiogram revealed no further      persistent filling defect, though there now appeared to be a filling      defect in the lower stent.  We then took a 2.75 x 15 mm Voyager balloon      into the distal stent as none of the previous balloons would navigate      into the stent.  This balloon was then inflated to 2 atmospheres for a      total of 20 seconds.  This balloon is then pulled back into the more      proximal OM stent and a second inflation of 14 atmospheres was      performed for 15 seconds.  Followup angiogram revealed  good luminal      gain with no evidence of dissection or thrombus in the proximal or more      distal stent.  There was now a TIMI-II flow into the upper branch.      Because of the problems we had with the previous stent, we elected to     treat this medically.  Patient was begun on Angiomax; however,  she was      ultimately switched to Integrilin.  Anticoagulation was continued      throughout the case.  IV nitroglycerin was also used throughout the      case.   Final followup angiograms revealed less than 10% residual stenosis in the  mid and more distal OM stenotic lesion with TIMI-II to III flow in the upper  branch of the OM, which was a smaller branch.  At this point, we elected to  conclude the procedure.  All balloons, wires, and catheters were removed.  Hemostatic sheath was sewn in place.  Patient was transferred back to the  ward in stable condition.   CONCLUSION:  1. Successful placement of a Micro-Driver 4.13 x 12 mm stent in the lower      branch of the first OM.  2. Successful placement of a Driver 3.0 x 15 mm stent in the mid portion      of the first OM.  3. Angioplasty of the upper branch of the first OM.  4. Adjunct use of Angiomax and Integrilin.  5. Mildly depressed LV systolic function.  6. Systemic hypertension.      Darlin Priestly, MD  Electronically Signed     RHM/MEDQ  D:  04/19/2006  T:  04/19/2006  Job:  [id number]   cc:   Gabriel Earing, M.D.

## 2011-01-28 NOTE — H&P (Signed)
Marissa [name], MARSEILLE                          ACCOUNT NO.:  [id number]   MEDICAL RECORD NO.:  [id number]                   PATIENT TYPE:  INP   LOCATION:  0102                                 FACILITY:  [facility]   PHYSICIAN:  Marissa [name], M.D.               DATE OF BIRTH:  [birth year]   DATE OF ADMISSION:  08/15/2002  DATE OF DISCHARGE:                                HISTORY & PHYSICAL   HISTORY OF PRESENT ILLNESS:  This is a 70 year old black female who is a  patient of Dr. Louanna Raw and has seen Dr. Ritta Slot recently.  Dr.  Kinnie Scales transferred her or sent her to the [facility] Emergency Room for me  to evaluate with a diagnosis of pneumoperitoneum and diverticulitis.   Much of her history is obtained from her daughters.  Two daughters are in  the room with her who help with her history.  She goes back to about August  2003, when she had some very vague abdominal pain which has been described  as periumbilical, cramping.  She thinks she has lost 30-50 pounds over the  last three to six months.  The exact cause of this weight loss is unclear.  In November 2003, apparently she got pneumonia and was treated as an  outpatient by Dr. Louanna Raw.  She does not know the antibiotic she was  on.  After this she got better, then she was treated for gout.  She was  given Indocin as part of this treatment.  The exact timing is a little bit  unclear for the patient's daughters.  She apparently at first saw Dr. Kinnie Scales  on July 25, 2002.  He arranged a colonoscopy on July 29, 2002.  He  could not advance the colonoscope beyond the splenic flexure, and he saw by  his report slight evidence of sigmoid diverticulitis with purulent  discharge.  He attempted to get a CT scan.  He had written his note around  July 30, 2002, but apparently this was never done.  He did give the  patient a two-week course of Cipro, as best I can tell by the notes.  She  then represented to him today,  he thought, in a similar physical findings of  what she had just two and three weeks ago.  Because she had not had a CT  scan, he obtained a CT scan today.  There was a pneumoperitoneum now, and he  sent her over to the emergency room.  The patient thinks she may have had  some ulcer disease in the past, but this is really a soft finding.  She  never had an endoscopy.  She was just told that she has this.  She has no  evidence of liver disease, pancreatic disease.  She did have a laparoscopic  cholecystectomy by Dr. Dominga Ferry in 1993.  She has had at least one bout  of  diverticulitis, apparently about 2001.  It apparently got better and  required nothing further.   ALLERGIES:  None.   CURRENT MEDICATIONS:  Prevacid, ibuprofen, iron, but the family did not  bring her medicines.  Those, actually, I am getting from the old chart.   REVIEW OF SYSTEMS:  NEUROLOGIC:  No evidence of seizure or loss of  consciousness.  PULMONARY:  Gives history of pneumonia in the last month or  six weeks, by Dr. Louanna Raw.  CARDIAC:  She has had prior heart doctor,  and the family cannot remember his name.  She has never had a heart  catheterization.  She has had hypertension for a number of years, but no  chest pain or angina that has been identified as angina.  GASTROINTESTINAL:  Please see history of present illness.  UROLOGIC:  No history of kidney  stones or kidney infection.  She has five children.  METABOLIC:  She carries  a diagnosis of gout.  She has had some blood transfusion over that time, but  it is a little bit unclear.   PHYSICAL EXAMINATION:  VITAL SIGNS:  Temperature 98.2, blood pressure  143/93, pulse 109.  GENERAL:  Well-nourished, older black female who is alert, arousable, can  talk to you.  HEENT:  Unremarkable.  NECK:  Supple.  Without mass or thyromegaly.  LUNGS:  Clear to auscultation.  She has pretty good inspiratory effort.  HEART:  Regular rate and rhythm.  BREASTS:   Symmetrical.  I feel no mass.  No tenderness.  No nodularity.  She  has not had a mammogram in a number of years.  I encouraged her to get an  updated mammogram.  ABDOMEN:  Well-healed lower midline abdominal scar.  She has some diffuse  tenderness which may be a little bit more on the left side than the right  side.  It is kind of minimal tenderness.  She did not have a lot of guarding  or rebound.  She has had some morphine which may have clouded the picture  some.  I see no hernia or abdominal mass.  RECTAL:  I did not do a rectal exam on her.  EXTREMITIES:  Good strength in all four extremities.  NEUROLOGIC:  Grossly intact.   LABORATORY DATA:  Her laboratories that I have show a white blood count of  7200, neutrophils were high normal being 77.  She has a hemoglobin of 8,  hematocrit 26, platelet count 462,000.  Her sodium is 136, potassium 3.1,  chloride of 107, CO2 of 26.  Her creatinine is 1.2, total protein is 6.7,  albumin 2.5.   ASSESSMENT AND PLAN:  1. Perforated diverticulitis:  The patient does not appear toxic although     she is having abdominal pain.  She has a normal white blood count, a     normal temperature.  My plan initially is to treat her medically and see     if we cannot get this __________.  Keep her n.p.o., place her on IV     fluids and antibiotics, and repeat her physical exam and her laboratory     tests as necessary.  2. Weight loss:  Etiology unclear.  3. Anemia:  Presumably secondary to diverticular disease, but, again,     etiology is unclear.  4. Borderline malnutrition:  If she stays n.p.o. for a prolonged period of     time she may very well need support with TPN.  5. Hypertension.  6. History of pneumonia.  7. History of C4-C5 fusion several years ago.  8. Hypokalemia with a potassium of 3.1.                                               Marissa [name], M.D.    DHN/MEDQ  D:  08/15/2002  T:  08/16/2002  Job:  [id number]   cc:   Louanna Raw [address]  [place]  [place] [address]  Fax: [phone]   [name], M.D.  [facility] [place]  [place] [address]  Fax: [phone]

## 2011-02-11 HISTORY — PX: DILATION AND CURETTAGE OF UTERUS: SHX78

## 2011-02-22 LAB — HM PAP SMEAR: HM Pap smear: NORMAL

## 2011-04-15 ENCOUNTER — Emergency Department (HOSPITAL_COMMUNITY): Payer: Medicare Other

## 2011-04-15 ENCOUNTER — Emergency Department (HOSPITAL_COMMUNITY)
Admission: EM | Admit: 2011-04-15 | Discharge: 2011-04-15 | Disposition: A | Discharge status: Home / Self Care | Payer: Medicare Other | Attending: Emergency Medicine | Admitting: Emergency Medicine

## 2011-04-15 DIAGNOSIS — R0602 Shortness of breath: Secondary | ICD-10-CM | POA: Insufficient documentation

## 2011-04-15 DIAGNOSIS — K5730 Diverticulosis of large intestine without perforation or abscess without bleeding: Secondary | ICD-10-CM | POA: Insufficient documentation

## 2011-04-15 DIAGNOSIS — D259 Leiomyoma of uterus, unspecified: Secondary | ICD-10-CM | POA: Insufficient documentation

## 2011-04-15 DIAGNOSIS — R110 Nausea: Secondary | ICD-10-CM | POA: Insufficient documentation

## 2011-04-15 DIAGNOSIS — R42 Dizziness and giddiness: Secondary | ICD-10-CM | POA: Insufficient documentation

## 2011-04-15 DIAGNOSIS — G8929 Other chronic pain: Secondary | ICD-10-CM | POA: Insufficient documentation

## 2011-04-15 DIAGNOSIS — I252 Old myocardial infarction: Secondary | ICD-10-CM | POA: Insufficient documentation

## 2011-04-15 DIAGNOSIS — E785 Hyperlipidemia, unspecified: Secondary | ICD-10-CM | POA: Insufficient documentation

## 2011-04-15 DIAGNOSIS — M549 Dorsalgia, unspecified: Secondary | ICD-10-CM | POA: Insufficient documentation

## 2011-04-15 DIAGNOSIS — E7800 Pure hypercholesterolemia, unspecified: Secondary | ICD-10-CM | POA: Insufficient documentation

## 2011-04-15 DIAGNOSIS — N898 Other specified noninflammatory disorders of vagina: Secondary | ICD-10-CM | POA: Insufficient documentation

## 2011-04-15 DIAGNOSIS — R5381 Other malaise: Secondary | ICD-10-CM | POA: Insufficient documentation

## 2011-04-15 DIAGNOSIS — I10 Essential (primary) hypertension: Secondary | ICD-10-CM | POA: Insufficient documentation

## 2011-04-15 DIAGNOSIS — I2510 Atherosclerotic heart disease of native coronary artery without angina pectoris: Secondary | ICD-10-CM | POA: Insufficient documentation

## 2011-04-15 DIAGNOSIS — N719 Inflammatory disease of uterus, unspecified: Secondary | ICD-10-CM | POA: Insufficient documentation

## 2011-04-15 DIAGNOSIS — R5383 Other fatigue: Secondary | ICD-10-CM | POA: Insufficient documentation

## 2011-04-15 DIAGNOSIS — I509 Heart failure, unspecified: Secondary | ICD-10-CM | POA: Insufficient documentation

## 2011-04-15 DIAGNOSIS — R109 Unspecified abdominal pain: Secondary | ICD-10-CM | POA: Insufficient documentation

## 2011-04-15 LAB — CBC
MCH: 21.9 pg — ABNORMAL LOW (ref 26.0–34.0)
MCV: 69.3 fL — ABNORMAL LOW (ref 78.0–100.0)
Platelets: 211 10*3/uL (ref 150–400)
RBC: 5.24 MIL/uL — ABNORMAL HIGH (ref 3.87–5.11)
RDW: 13.7 % (ref 11.5–15.5)

## 2011-04-15 LAB — BASIC METABOLIC PANEL
BUN: 19 mg/dL (ref 6–23)
CO2: 23 mEq/L (ref 19–32)
Chloride: 103 mEq/L (ref 96–112)
Glucose, Bld: 95 mg/dL (ref 70–99)
Potassium: 3.4 mEq/L — ABNORMAL LOW (ref 3.5–5.1)
Sodium: 137 mEq/L (ref 135–145)

## 2011-04-15 LAB — DIFFERENTIAL
Basophils Absolute: 0.1 10*3/uL (ref 0.0–0.1)
Basophils Relative: 1 % (ref 0–1)
Eosinophils Absolute: 0.2 10*3/uL (ref 0.0–0.7)
Lymphocytes Relative: 18 % (ref 12–46)
Monocytes Absolute: 0.5 10*3/uL (ref 0.1–1.0)
Neutro Abs: 4.5 10*3/uL (ref 1.7–7.7)
Neutrophils Relative %: 70 % (ref 43–77)

## 2011-04-15 LAB — TYPE AND SCREEN: ABO/RH(D): B POS

## 2011-04-15 MED ORDER — IOHEXOL 300 MG/ML  SOLN
80.0000 mL | Freq: Once | INTRAMUSCULAR | Status: AC | PRN
  Administered 2011-04-15: 80 mL via INTRAVENOUS

## 2011-06-07 LAB — BASIC METABOLIC PANEL
BUN: 12
CO2: 24
Chloride: 106
GFR calc non Af Amer: 49 — ABNORMAL LOW
Glucose, Bld: 99
Potassium: 3.6
Sodium: 137

## 2011-06-07 LAB — CBC
HCT: 28.6 — ABNORMAL LOW
Hemoglobin: 9.2 — ABNORMAL LOW
MCHC: 32.2
MCV: 70.5 — ABNORMAL LOW
RDW: 13.5

## 2011-06-10 LAB — BASIC METABOLIC PANEL
BUN: 16
BUN: 17
CO2: 24
CO2: 25
Calcium: 9.4
Chloride: 104
Creatinine, Ser: 1.16
GFR calc non Af Amer: 41 — ABNORMAL LOW
GFR calc non Af Amer: 47 — ABNORMAL LOW
Glucose, Bld: 75
Glucose, Bld: 99
Potassium: 3.4 — ABNORMAL LOW
Potassium: 3.4 — ABNORMAL LOW
Potassium: 3.7
Sodium: 138
Sodium: 142

## 2011-06-10 LAB — CBC
HCT: 36.1
HCT: 38.7
Hemoglobin: 11.4 — ABNORMAL LOW
Hemoglobin: 12.4
MCHC: 31.6
MCV: 68.1 — ABNORMAL LOW
MCV: 68.2 — ABNORMAL LOW
MCV: 69.6 — ABNORMAL LOW
Platelets: 231
Platelets: 255
RBC: 5.77 — ABNORMAL HIGH
RDW: 13.6
RDW: 13.6
WBC: 5.4

## 2011-06-10 LAB — POCT I-STAT, CHEM 8
BUN: 18
Calcium, Ion: 1.16
Chloride: 110
Creatinine, Ser: 1.6 — ABNORMAL HIGH
Glucose, Bld: 97
HCT: 41
Hemoglobin: 13.9
Potassium: 3.3 — ABNORMAL LOW
Sodium: 143
TCO2: 25

## 2011-06-10 LAB — DIFFERENTIAL
Basophils Absolute: 0
Basophils Absolute: 0.1
Basophils Relative: 0
Eosinophils Absolute: 0.2
Lymphs Abs: 1
Monocytes Absolute: 0.5
Monocytes Absolute: 0.6
Neutro Abs: 3.4
Neutrophils Relative %: 80 — ABNORMAL HIGH

## 2011-06-10 LAB — D-DIMER, QUANTITATIVE: D-Dimer, Quant: 1.49 — ABNORMAL HIGH

## 2011-06-10 LAB — URINALYSIS, ROUTINE W REFLEX MICROSCOPIC
Bilirubin Urine: NEGATIVE
Protein, ur: 30 — AB
Urobilinogen, UA: 1

## 2011-06-10 LAB — POCT CARDIAC MARKERS
CKMB, poc: 1.9
CKMB, poc: 2.4
Myoglobin, poc: 161
Myoglobin, poc: 189
Troponin i, poc: <0.05
Troponin i, poc: <0.05

## 2011-06-10 LAB — BASIC METABOLIC PANEL WITH GFR
BUN: 15
Calcium: 9.5
Chloride: 107
Creatinine, Ser: 1.3 — ABNORMAL HIGH
GFR calc Af Amer: 49 — ABNORMAL LOW

## 2011-06-10 LAB — APTT: aPTT: 33

## 2011-06-10 LAB — HEPARIN LEVEL (UNFRACTIONATED): Heparin Unfractionated: 1.21 — ABNORMAL HIGH

## 2011-06-10 LAB — CK TOTAL AND CKMB (NOT AT ARMC)
Relative Index: 1.4
Total CK: 184 — ABNORMAL HIGH

## 2011-06-10 LAB — PROTIME-INR
INR: 1
Prothrombin Time: 12.9

## 2011-06-10 LAB — URINE MICROSCOPIC-ADD ON

## 2011-06-10 LAB — URINE CULTURE: Colony Count: 70000

## 2011-06-10 LAB — CARDIAC PANEL(CRET KIN+CKTOT+MB+TROPI): Troponin I: 0.01

## 2011-06-10 LAB — LIPID PANEL
Total CHOL/HDL Ratio: 2.9
Triglycerides: 64
VLDL: 13

## 2011-06-13 LAB — CBC
HCT: 33.6 — ABNORMAL LOW
Hemoglobin: 10.6 — ABNORMAL LOW
MCHC: 31.9
MCHC: 32.4
MCV: 67.2 — ABNORMAL LOW
MCV: 68.9 — ABNORMAL LOW
MCV: 69.4 — ABNORMAL LOW
Platelets: 209
Platelets: 233
RBC: 4.88
RDW: 14
RDW: 14.1
WBC: 6.3

## 2011-06-13 LAB — BASIC METABOLIC PANEL
BUN: 11
Calcium: 9.3
Chloride: 106
Creatinine, Ser: 1.17
GFR calc non Af Amer: 46 — ABNORMAL LOW

## 2011-06-13 LAB — COMPREHENSIVE METABOLIC PANEL
ALT: 27
AST: 28
Calcium: 9.7
GFR calc Af Amer: >60
Sodium: 137
Total Protein: 6.4

## 2011-06-15 LAB — BASIC METABOLIC PANEL
CO2: 24
Calcium: 9.8
Calcium: 9.9
Chloride: 102
GFR calc Af Amer: 60 — ABNORMAL LOW
GFR calc non Af Amer: 50 — ABNORMAL LOW
Glucose, Bld: 104 — ABNORMAL HIGH
Glucose, Bld: 60 — ABNORMAL LOW
Potassium: 4.5
Sodium: 135
Sodium: 137

## 2011-06-15 LAB — POCT I-STAT, CHEM 8
BUN: 29 — ABNORMAL HIGH
Chloride: 113 — ABNORMAL HIGH
Creatinine, Ser: 1.5 — ABNORMAL HIGH
Glucose, Bld: 81
Potassium: 4.6

## 2011-06-15 LAB — HEPARIN LEVEL (UNFRACTIONATED)
Heparin Unfractionated: 0.29 — ABNORMAL LOW
Heparin Unfractionated: 0.4
Heparin Unfractionated: 0.74 — ABNORMAL HIGH
Heparin Unfractionated: 0.88 — ABNORMAL HIGH
Heparin Unfractionated: 1.3 — ABNORMAL HIGH
Heparin Unfractionated: <0.1 — ABNORMAL LOW

## 2011-06-15 LAB — CBC
HCT: 41.6
Hemoglobin: 13
Hemoglobin: 13.2
MCHC: 31.6
MCHC: 32.3
MCV: 67.8 — ABNORMAL LOW
MCV: 67.8 — ABNORMAL LOW
MCV: 69 — ABNORMAL LOW
Platelets: 200
Platelets: 211
Platelets: 237
RBC: 5.28 — ABNORMAL HIGH
RBC: 5.67 — ABNORMAL HIGH
RBC: 5.94 — ABNORMAL HIGH
RBC: 6.02 — ABNORMAL HIGH
WBC: 5.8
WBC: 7.4
WBC: 8.4
WBC: 9.7

## 2011-06-15 LAB — HEPATIC FUNCTION PANEL
ALT: 17
AST: 34
AST: 37
Albumin: 3.6
Bilirubin, Direct: 0.2
Total Bilirubin: 1.1
Total Protein: 8.4 — ABNORMAL HIGH

## 2011-06-15 LAB — DIFFERENTIAL
Basophils Relative: 0
Eosinophils Absolute: 0.1
Eosinophils Relative: 1
Eosinophils Relative: 1
Lymphocytes Relative: 9 — ABNORMAL LOW
Lymphs Abs: 0.9
Monocytes Absolute: 0.6
Monocytes Relative: 12
Neutro Abs: 7.1

## 2011-06-15 LAB — URINALYSIS, ROUTINE W REFLEX MICROSCOPIC
Bilirubin Urine: NEGATIVE
Glucose, UA: NEGATIVE
Hgb urine dipstick: NEGATIVE
Specific Gravity, Urine: 1.022
pH: 5

## 2011-06-15 LAB — COMPREHENSIVE METABOLIC PANEL
ALT: 27
Alkaline Phosphatase: 84
BUN: 14
CO2: 25
GFR calc non Af Amer: 43 — ABNORMAL LOW
Glucose, Bld: 101 — ABNORMAL HIGH
Potassium: 4.1
Total Bilirubin: 1.4 — ABNORMAL HIGH
Total Protein: 7.7

## 2011-08-16 ENCOUNTER — Other Ambulatory Visit: Payer: Self-pay | Admitting: Obstetrics & Gynecology

## 2011-08-24 ENCOUNTER — Encounter: Payer: Self-pay | Admitting: Internal Medicine

## 2011-08-24 ENCOUNTER — Ambulatory Visit (INDEPENDENT_AMBULATORY_CARE_PROVIDER_SITE_OTHER): Payer: Medicare Other | Admitting: Internal Medicine

## 2011-08-24 VITALS — BP 154/82 | HR 69 | Temp 96.8°F | Ht 65.0 in | Wt 240.3 lb

## 2011-08-24 DIAGNOSIS — K219 Gastro-esophageal reflux disease without esophagitis: Secondary | ICD-10-CM

## 2011-08-24 DIAGNOSIS — E785 Hyperlipidemia, unspecified: Secondary | ICD-10-CM

## 2011-08-24 DIAGNOSIS — Z23 Encounter for immunization: Secondary | ICD-10-CM

## 2011-08-24 DIAGNOSIS — I10 Essential (primary) hypertension: Secondary | ICD-10-CM

## 2011-08-24 DIAGNOSIS — I2510 Atherosclerotic heart disease of native coronary artery without angina pectoris: Secondary | ICD-10-CM

## 2011-08-24 LAB — COMPREHENSIVE METABOLIC PANEL
Albumin: 3.8 g/dL (ref 3.5–5.2)
Alkaline Phosphatase: 77 U/L (ref 39–117)
CO2: 23 mEq/L (ref 19–32)
Glucose, Bld: 98 mg/dL (ref 70–99)
Potassium: 4.1 mEq/L (ref 3.5–5.3)
Sodium: 137 mEq/L (ref 135–145)
Total Protein: 8.2 g/dL (ref 6.0–8.3)

## 2011-08-24 LAB — CBC
Hemoglobin: 11.1 g/dL — ABNORMAL LOW (ref 12.0–15.0)
RBC: 5.2 MIL/uL — ABNORMAL HIGH (ref 3.87–5.11)
WBC: 5.1 10*3/uL (ref 4.0–10.5)

## 2011-08-24 LAB — LIPID PANEL
LDL Cholesterol: 133 mg/dL — ABNORMAL HIGH (ref 0–99)
VLDL: 20 mg/dL (ref 0–40)

## 2011-08-24 MED ORDER — CHLORTHALIDONE 25 MG PO TABS: 12.5000 mg | ORAL_TABLET | Freq: Every day | ORAL | Status: DC

## 2011-08-24 MED ORDER — METOPROLOL TARTRATE 25 MG PO TABS: 25.0000 mg | ORAL_TABLET | Freq: Two times a day (BID) | ORAL | Status: DC

## 2011-08-24 MED ORDER — PRAVASTATIN SODIUM 40 MG PO TABS: 40.0000 mg | ORAL_TABLET | Freq: Every day | ORAL | Status: DC

## 2011-08-24 MED ORDER — ESOMEPRAZOLE MAGNESIUM 40 MG PO CPDR: 40.0000 mg | DELAYED_RELEASE_CAPSULE | Freq: Every day | ORAL | Status: DC

## 2011-08-24 NOTE — Assessment & Plan Note (Signed)
Not on any medications, we'll start Nexium and reevaluate patient is not improved

## 2011-08-24 NOTE — Assessment & Plan Note (Addendum)
Was previously on Crestor, can't Afford this medication, will change to pravastatin, and check fasting lipid along with her complete metabolic profile

## 2011-08-24 NOTE — Assessment & Plan Note (Addendum)
Display symptoms of angina, advised the patient to go to cardiology for possible stress test given her history of prior PCI's

## 2011-08-24 NOTE — Patient Instructions (Signed)

## 2011-08-24 NOTE — Assessment & Plan Note (Signed)
Restart metoprolol and started low-dose chlorthalidone, at next followup chlorthalidone may be increased to one tablet daily of 25 mg for optimal blood pressure control

## 2011-08-24 NOTE — Progress Notes (Signed)
  Subjective:    Patient ID: Marissa [name], female    DOB: June 20, 1941, 70 y.o.   MRN: [id number]  HPI  Patient is a 70 year old female with a past medical history listed below, has not been in the clinic in over a year, presents to the outpatient clinic with complaints of GERD-like symptoms also complains of lower extremity edema, patient has not taken any of her medications for the past year, also complains of stable angina symptoms which last for several seconds on exertion. Denies any other complaints  Review of Systems  All other systems reviewed and are negative.       Objective:   Physical Exam  Nursing note and vitals reviewed. Constitutional: She is oriented to person, place, and time. She appears well-developed and well-nourished.  HENT:  Head: Normocephalic and atraumatic.  Eyes: Pupils are equal, round, and reactive to light.  Neck: Normal range of motion. Neck supple. No JVD present. No thyromegaly present.  Cardiovascular: Normal rate, regular rhythm and normal heart sounds.   No murmur heard. Pulmonary/Chest: Effort normal and breath sounds normal. She has no wheezes. She has no rales.  Abdominal: Soft. Bowel sounds are normal.  Musculoskeletal: Normal range of motion. She exhibits edema. She exhibits no tenderness.  Neurological: She is alert and oriented to person, place, and time.  Skin: Skin is warm and dry.          Assessment & Plan:

## 2011-09-14 ENCOUNTER — Encounter: Payer: Self-pay | Admitting: *Deleted

## 2011-09-16 ENCOUNTER — Ambulatory Visit: Payer: Medicare Other | Admitting: Internal Medicine

## 2011-09-16 DIAGNOSIS — Z0289 Encounter for other administrative examinations: Secondary | ICD-10-CM

## 2011-10-06 ENCOUNTER — Encounter: Payer: Self-pay | Admitting: Internal Medicine

## 2011-10-06 ENCOUNTER — Ambulatory Visit (INDEPENDENT_AMBULATORY_CARE_PROVIDER_SITE_OTHER): Payer: Medicaid Other | Admitting: Internal Medicine

## 2011-10-06 ENCOUNTER — Other Ambulatory Visit (INDEPENDENT_AMBULATORY_CARE_PROVIDER_SITE_OTHER): Payer: Medicaid Other

## 2011-10-06 VITALS — BP 140/80 | HR 75 | Temp 97.4°F | Resp 16 | Wt 235.0 lb

## 2011-10-06 DIAGNOSIS — N898 Other specified noninflammatory disorders of vagina: Secondary | ICD-10-CM

## 2011-10-06 DIAGNOSIS — E785 Hyperlipidemia, unspecified: Secondary | ICD-10-CM

## 2011-10-06 DIAGNOSIS — Z86718 Personal history of other venous thrombosis and embolism: Secondary | ICD-10-CM

## 2011-10-06 DIAGNOSIS — D62 Acute posthemorrhagic anemia: Secondary | ICD-10-CM

## 2011-10-06 DIAGNOSIS — N939 Abnormal uterine and vaginal bleeding, unspecified: Secondary | ICD-10-CM

## 2011-10-06 DIAGNOSIS — R0600 Dyspnea, unspecified: Secondary | ICD-10-CM

## 2011-10-06 DIAGNOSIS — R0989 Other specified symptoms and signs involving the circulatory and respiratory systems: Secondary | ICD-10-CM

## 2011-10-06 DIAGNOSIS — D649 Anemia, unspecified: Secondary | ICD-10-CM

## 2011-10-06 DIAGNOSIS — I2510 Atherosclerotic heart disease of native coronary artery without angina pectoris: Secondary | ICD-10-CM

## 2011-10-06 DIAGNOSIS — R0602 Shortness of breath: Secondary | ICD-10-CM

## 2011-10-06 DIAGNOSIS — IMO0001 Reserved for inherently not codable concepts without codable children: Secondary | ICD-10-CM

## 2011-10-06 DIAGNOSIS — I10 Essential (primary) hypertension: Secondary | ICD-10-CM

## 2011-10-06 LAB — CBC WITH DIFFERENTIAL/PLATELET
Basophils Relative: 0.3 % (ref 0.0–3.0)
Eosinophils Absolute: 0.1 10*3/uL (ref 0.0–0.7)
Eosinophils Relative: 2.2 % (ref 0.0–5.0)
HCT: 35.1 % — ABNORMAL LOW (ref 36.0–46.0)
Lymphs Abs: 1.1 10*3/uL (ref 0.7–4.0)
MCHC: 31.9 g/dL (ref 30.0–36.0)
MCV: 69 fl — ABNORMAL LOW (ref 78.0–100.0)
Monocytes Absolute: 0.4 10*3/uL (ref 0.1–1.0)
Platelets: 209 10*3/uL (ref 150.0–400.0)
RBC: 5.09 Mil/uL (ref 3.87–5.11)
WBC: 6.3 10*3/uL (ref 4.5–10.5)

## 2011-10-06 LAB — URINALYSIS, ROUTINE W REFLEX MICROSCOPIC
Nitrite: NEGATIVE
Specific Gravity, Urine: 1.025 (ref 1.000–1.030)
Total Protein, Urine: NEGATIVE
Urine Glucose: NEGATIVE
Urobilinogen, UA: 0.2 (ref 0.0–1.0)

## 2011-10-06 LAB — LIPID PANEL
Cholesterol: 174 mg/dL (ref 0–200)
HDL: 49.7 mg/dL (ref >39.00)
VLDL: 22.2 mg/dL (ref 0.0–40.0)

## 2011-10-06 LAB — COMPREHENSIVE METABOLIC PANEL
Alkaline Phosphatase: 78 U/L (ref 39–117)
CO2: 25 mEq/L (ref 19–32)
Creatinine, Ser: 1.2 mg/dL (ref 0.4–1.2)
GFR: 56.97 mL/min — ABNORMAL LOW (ref >60.00)
Glucose, Bld: 100 mg/dL — ABNORMAL HIGH (ref 70–99)
Total Bilirubin: 0.6 mg/dL (ref 0.3–1.2)

## 2011-10-06 LAB — BRAIN NATRIURETIC PEPTIDE: Pro B Natriuretic peptide (BNP): 52 pg/mL (ref 0.0–100.0)

## 2011-10-06 LAB — FERRITIN: Ferritin: 49.4 ng/mL (ref 10.0–291.0)

## 2011-10-06 LAB — IBC PANEL
Iron: 50 ug/dL (ref 42–145)
Saturation Ratios: 16.5 % — ABNORMAL LOW (ref 20.0–50.0)
Transferrin: 216.7 mg/dL (ref 212.0–360.0)

## 2011-10-06 LAB — TSH: TSH: 2.12 u[IU]/mL (ref 0.35–5.50)

## 2011-10-06 MED ORDER — PRAVASTATIN SODIUM 40 MG PO TABS: 40.0000 mg | ORAL_TABLET | Freq: Every day | ORAL | Status: DC

## 2011-10-06 MED ORDER — LISINOPRIL 10 MG PO TABS: 10.0000 mg | ORAL_TABLET | Freq: Every day | ORAL | Status: DC

## 2011-10-06 NOTE — Assessment & Plan Note (Signed)
She needs an updated cardiology evaluation

## 2011-10-06 NOTE — Progress Notes (Signed)
Subjective:    Patient ID: Marissa [name], female    DOB: 1941-06-04, 71 y.o.   MRN: [id number]  Anemia Presents for follow-up visit. Symptoms include malaise/fatigue. There has been no abdominal pain, anorexia, bruising/bleeding easily, confusion, fever, leg swelling, light-headedness, pallor, palpitations, paresthesias, pica or weight loss. Signs of blood loss that are present include vaginal bleeding. Signs of blood loss that are not present include hematemesis, hematochezia and melena. Compliance problems include psychosocial issues.  Compliance with medications is 0-25%.  Diabetes She presents for her follow-up diabetic visit. She has type 2 diabetes mellitus. Her disease course has been stable. There are no hypoglycemic associated symptoms. Pertinent negatives for hypoglycemia include no confusion, dizziness, headaches, pallor, seizures, speech difficulty or tremors. Associated symptoms include fatigue. Pertinent negatives for diabetes include no blurred vision, no chest pain, no foot paresthesias, no foot ulcerations, no polydipsia, no polyphagia, no polyuria, no visual change, no weakness and no weight loss. There are no hypoglycemic complications. Symptoms are stable. Diabetic complications include heart disease. When asked about current treatments, none were reported. Her weight is stable. She is following a generally unhealthy diet. When asked about meal planning, she reported none. She never participates in exercise. There is no change in her home blood glucose trend. An ACE inhibitor/angiotensin II receptor blocker is not being taken. She does not see a podiatrist.Eye exam is not current.      Review of Systems  Constitutional: Positive for malaise/fatigue and fatigue. Negative for fever, chills, weight loss, diaphoresis, activity change, appetite change and unexpected weight change.  HENT: Negative.   Eyes: Negative.  Negative for blurred vision.  Respiratory: Positive for shortness of  breath. Negative for cough, choking, chest tightness, wheezing and stridor.   Cardiovascular: Negative for chest pain, palpitations and leg swelling.  Gastrointestinal: Negative for nausea, vomiting, abdominal pain, diarrhea, constipation, blood in stool, melena, hematochezia, abdominal distention, anal bleeding, rectal pain, anorexia and hematemesis.  Genitourinary: Positive for vaginal bleeding. Negative for dysuria, urgency, polyuria, frequency, hematuria, flank pain, decreased urine volume, vaginal discharge, enuresis, difficulty urinating, genital sores, vaginal pain, menstrual problem, pelvic pain and dyspareunia.  Musculoskeletal: Negative for myalgias, back pain, joint swelling, arthralgias and gait problem.  Skin: Negative for color change, pallor, rash and wound.  Neurological: Negative for dizziness, tremors, seizures, syncope, facial asymmetry, speech difficulty, weakness, light-headedness, numbness, headaches and paresthesias.  Hematological: Negative for polydipsia, polyphagia and adenopathy. Does not bruise/bleed easily.  Psychiatric/Behavioral: Negative.  Negative for confusion.       Objective:   Physical Exam  Vitals reviewed. Constitutional: She is oriented to person, place, and time. She appears well-developed and well-nourished. No distress.  HENT:  Head: Normocephalic and atraumatic.  Mouth/Throat: Oropharynx is clear and moist. Mucous membranes are pale, not dry and not cyanotic. No uvula swelling. No oropharyngeal exudate or tonsillar abscesses.  Eyes: Conjunctivae are normal. Right eye exhibits no discharge. Left eye exhibits no discharge. No scleral icterus.  Neck: Normal range of motion. Neck supple. No JVD present. No tracheal deviation present. No thyromegaly present.  Cardiovascular: Normal rate, regular rhythm, normal heart sounds and intact distal pulses.  Exam reveals no gallop and no friction rub.   No murmur heard. Pulmonary/Chest: Effort normal and breath  sounds normal. No stridor. No respiratory distress. She has no wheezes. She has no rales. She exhibits no tenderness.  Abdominal: Soft. Bowel sounds are normal. She exhibits no distension and no mass. There is no tenderness. There is no rebound and no guarding.  Musculoskeletal: Normal range of motion. She exhibits no edema and no tenderness.  Lymphadenopathy:    She has no cervical adenopathy.  Neurological: She is oriented to person, place, and time.  Skin: Skin is warm and dry. No rash noted. She is not diaphoretic. No erythema. No pallor.  Psychiatric: She has a normal mood and affect. Her behavior is normal. Judgment and thought content normal.      Lab Results  Component Value Date   WBC 5.1 08/24/2011   HGB 11.1* 08/24/2011   HCT 36.3 08/24/2011   PLT 259 08/24/2011   GLUCOSE 98 08/24/2011   CHOL 201* 08/24/2011   TRIG 102 08/24/2011   HDL 48 08/24/2011   LDLCALC 133* 08/24/2011   ALT 13 08/24/2011   AST 17 08/24/2011   NA 137 08/24/2011   K 4.1 08/24/2011   CL 104 08/24/2011   CREATININE 1.23* 08/24/2011   BUN 19 08/24/2011   CO2 23 08/24/2011   TSH 2.979 12/14/2010   INR 0.96 04/15/2011   HGBA1C  Value: 6.0 (NOTE)                                                                       According to the ADA Clinical Practice Recommendations for 2011, when HbA1c is used as a screening test:   >=6.5%   Diagnostic of Diabetes Mellitus           (if abnormal result  is confirmed)  5.7-6.4%   Increased risk of developing Diabetes Mellitus  References:Diagnosis and Classification of Diabetes Mellitus,Diabetes Care,2011,34(Suppl 1):S62-S69 and Standards of Medical Care in         Diabetes - 2011,Diabetes Care,2011,34  (Suppl 1):S11-S61.* 12/14/2010   MICROALBUR 0.50 04/03/2009      Assessment & Plan:

## 2011-10-06 NOTE — Assessment & Plan Note (Signed)
I will check her CBC 

## 2011-10-06 NOTE — Patient Instructions (Signed)
Anemia, Nonspecific Your exam and blood tests show you are anemic. This means your blood (hemoglobin) level is low. Normal hemoglobin values are 12 to 15 g/dL for females and 14 to 17 g/dL for males. Make a note of your hemoglobin level today. The hematocrit percent is also used to measure anemia. A normal hematocrit is 38% to 46% in females and 42% to 49% in males. Make a note of your hematocrit level today. CAUSES  Anemia can be due to many different causes.  Excessive bleeding from periods (in women).   Intestinal bleeding.   Poor nutrition.   Kidney, thyroid, liver, and bone marrow diseases.  SYMPTOMS  Anemia can come on suddenly (acute). It can also come on slowly. Symptoms can include:  Minor weakness.   Dizziness.   Palpitations.   Shortness of breath.  Symptoms may be absent until half your hemoglobin is missing if it comes on slowly. Anemia due to acute blood loss from an injury or internal bleeding may require blood transfusion if the loss is severe. Hospital care is needed if you are anemic and there is significant continual blood loss. TREATMENT   Stool tests for blood (Hemoccult) and additional lab tests are often needed. This determines the best treatment.   Further checking on your condition and your response to treatment is very important. It often takes many weeks to correct anemia.  Depending on the cause, treatment can include:  Supplements of iron.   Vitamins B12 and folic acid.   Hormone medicines.If your anemia is due to bleeding, finding the cause of the blood loss is very important. This will help avoid further problems.  SEEK IMMEDIATE MEDICAL CARE IF:   You develop fainting, extreme weakness, shortness of breath, or chest pain.   You develop heavy vaginal bleeding.   You develop bloody or black, tarry stools or vomit up blood.   You develop a high fever, rash, repeated vomiting, or dehydration.  Document Released: 10/06/2004 Document Revised:  05/11/2011 Document Reviewed: 07/14/2009 Exit[employer] Patient Information 2012 [employer]. 

## 2011-10-06 NOTE — Assessment & Plan Note (Signed)
With the SOB I will check a d-dimer today

## 2011-10-06 NOTE — Assessment & Plan Note (Signed)
CBC and vitamin levels today 

## 2011-10-06 NOTE — Assessment & Plan Note (Signed)
I will check her a1c today and will monitor her renal function 

## 2011-10-06 NOTE — Assessment & Plan Note (Signed)
She was asked to have a GYN follow up

## 2011-10-06 NOTE — Assessment & Plan Note (Signed)
Hr EKG does not show any evidence of ischemia or CHF, I will check her labs to look for secondary causes, I am suspicious that she is symptomatic from her anemia

## 2011-10-06 NOTE — Assessment & Plan Note (Signed)
She needs to start an ACEI for risk reduction, I will check for secondary causes today

## 2011-10-06 NOTE — Assessment & Plan Note (Signed)
FLP today and restart pravachol

## 2011-10-07 ENCOUNTER — Ambulatory Visit (INDEPENDENT_AMBULATORY_CARE_PROVIDER_SITE_OTHER): Payer: PRIVATE HEALTH INSURANCE | Admitting: Cardiology

## 2011-10-07 ENCOUNTER — Encounter: Payer: Self-pay | Admitting: Cardiology

## 2011-10-07 DIAGNOSIS — R079 Chest pain, unspecified: Secondary | ICD-10-CM

## 2011-10-07 DIAGNOSIS — I10 Essential (primary) hypertension: Secondary | ICD-10-CM

## 2011-10-07 DIAGNOSIS — R0602 Shortness of breath: Secondary | ICD-10-CM

## 2011-10-07 DIAGNOSIS — E785 Hyperlipidemia, unspecified: Secondary | ICD-10-CM

## 2011-10-07 DIAGNOSIS — R0989 Other specified symptoms and signs involving the circulatory and respiratory systems: Secondary | ICD-10-CM

## 2011-10-07 DIAGNOSIS — I2510 Atherosclerotic heart disease of native coronary artery without angina pectoris: Secondary | ICD-10-CM

## 2011-10-07 DIAGNOSIS — R0600 Dyspnea, unspecified: Secondary | ICD-10-CM

## 2011-10-07 LAB — BASIC METABOLIC PANEL
BUN: 16 mg/dL (ref 6–23)
Chloride: 107 mEq/L (ref 96–112)
Creatinine, Ser: 1.4 mg/dL — ABNORMAL HIGH (ref 0.4–1.2)
Glucose, Bld: 87 mg/dL (ref 70–99)
Potassium: 3.5 mEq/L (ref 3.5–5.1)

## 2011-10-07 LAB — BRAIN NATRIURETIC PEPTIDE: Pro B Natriuretic peptide (BNP): 58 pg/mL (ref 0.0–100.0)

## 2011-10-07 MED ORDER — OMEPRAZOLE 20 MG PO CPDR: 20.0000 mg | DELAYED_RELEASE_CAPSULE | Freq: Every day | ORAL | Status: DC

## 2011-10-07 MED ORDER — LISINOPRIL 10 MG PO TABS: 10.0000 mg | ORAL_TABLET | Freq: Every day | ORAL | Status: DC

## 2011-10-07 MED ORDER — AMLODIPINE BESYLATE 5 MG PO TABS: 5.0000 mg | ORAL_TABLET | Freq: Every day | ORAL | Status: DC

## 2011-10-07 MED ORDER — ATORVASTATIN CALCIUM 20 MG PO TABS: 20.0000 mg | ORAL_TABLET | Freq: Every day | ORAL | Status: DC

## 2011-10-07 NOTE — Patient Instructions (Signed)
Take aspirin 81mg  daily.  Start amlodipine 5mg  daily.  Start omeprazole 20mg  daily.  Start lisinopril 10mg  daily.  Start atorvastatin 20mg  daily.  Your physician has requested that you have an echocardiogram. Echocardiography is a painless test that uses sound waves to create images of your heart. It provides your doctor with information about the size and shape of your heart and how well your heart's chambers and valves are working. This procedure takes approximately one hour. There are no restrictions for this procedure.  Your physician has requested that you have a lexiscan myoview. For further information please visit [email or web address] Please follow instruction sheet, as given.  Your physician recommends that you schedule a follow-up appointment in: 2-3 weeks with Dr Shirlee Latch.

## 2011-10-08 ENCOUNTER — Encounter: Payer: Self-pay | Admitting: Internal Medicine

## 2011-10-09 NOTE — Assessment & Plan Note (Addendum)
Chest pain with typical and atypical features.  History of CAD with OM stenosis and restenosis in 2007.   Steffanie Dunn to assess for ischemia.  - Start ASA 81 mg daily, statin, and ACEI.   - I wonder if nocturnal pain could be due to GERD.  I will have her try omeprazole 20 mg daily.

## 2011-10-09 NOTE — Assessment & Plan Note (Signed)
BP is quite high, she is not taking any medications.  I will start her on amlodipine 5 mg daily and lisinopril 10 mg daily. BMET in 2wks.

## 2011-10-09 NOTE — Assessment & Plan Note (Signed)
Goal LDL < 70 with known CAD.  Start atorvastatin 20 mg daily with lipids/LFTs in 2 months.

## 2011-10-09 NOTE — Progress Notes (Signed)
PCP: Dr. Yetta Barre  71 yo with history of CAD and HTN presents for cardiology evaluation of chest pain.  She is not taking any medications and has been out of her prior meds fo rseveral months.  Main complaint is several months of chest pain episodes.  These can occur at rest or with exertion.  There is no clear trigger.  They are left-sided and last 5-10 minutes typically.  She also wakes up at night several times a week with chest pain also.  She has chronic exertional dyspnea after walking about 50 feet or up any incline. No orthopnea or PND. Her hands and feet are painful and tingle.  She has been told in the past that this is due to peripheral neuropathy.   Labs (1/13): LDL 133, HDL 48, HCT 36.3, K 4.1, creatinine 1.23  ECG: NSR, normal  PMH: 1. Anemia 2. Diabetes: diet-controlled. 3. HTN 4. GERD 5. History of diverticulitis 6. Hyperlipidemia 7. Schatzski's ring 8. Fibroids 9. CAD: 8/07 had BMS to OM.  In-stent restenosis with later Promus DES to same site.  Last echo was 10/06 with EF 60%.  10. Peripheral neuropathy 11. HTN  SH: Single with 1 daughter.  She does not smoke.   FH: No premature CAD  ROS: All systems reviewed and negative except as per HPI.   Current Outpatient Prescriptions  Medication Sig Dispense Refill  . amLODipine (NORVASC) 5 MG tablet Take 1 tablet (5 mg total) by mouth daily.  30 tablet  6  . aspirin 81 MG tablet Take 81 mg by mouth daily.        Marland Kitchen atorvastatin (LIPITOR) 20 MG tablet Take 1 tablet (20 mg total) by mouth daily.  30 tablet  6  . lisinopril (PRINIVIL,ZESTRIL) 10 MG tablet Take 1 tablet (10 mg total) by mouth daily.  30 tablet  6  . omeprazole (PRILOSEC) 20 MG capsule Take 1 capsule (20 mg total) by mouth daily.  30 capsule  6    BP 180/105  Pulse 82  Ht 5\' 4"  (1.626 m)  Wt 106.505 kg (234 lb 12.8 oz)  BMI 40.30 kg/m2 General: NAD, obese. Neck: No JVD, no thyromegaly or thyroid nodule.  Lungs: Clear to auscultation bilaterally with normal  respiratory effort. CV: Nondisplaced PMI.  Heart regular S1/S2, no S3/S4, no murmur.  No peripheral edema.  No carotid bruit.  2+ DP pulses bilaterally.  Abdomen: Soft, nontender, no hepatosplenomegaly, no distention.  Skin: Intact without lesions or rashes.  Neurologic: Alert and oriented x 3.  Psych: Normal affect. Extremities: No clubbing or cyanosis.  HEENT: Normal.

## 2011-10-09 NOTE — Assessment & Plan Note (Signed)
Chronic exertional dyspnea.  This could certainly be due to obesity/deconditioning.  She does not appear significantly volume overloaded on exam. However, will check BNP and echo.

## 2011-10-18 ENCOUNTER — Ambulatory Visit (HOSPITAL_COMMUNITY): Payer: PRIVATE HEALTH INSURANCE | Attending: Cardiology | Admitting: Radiology

## 2011-10-18 DIAGNOSIS — R61 Generalized hyperhidrosis: Secondary | ICD-10-CM | POA: Insufficient documentation

## 2011-10-18 DIAGNOSIS — R000 Tachycardia, unspecified: Secondary | ICD-10-CM | POA: Insufficient documentation

## 2011-10-18 DIAGNOSIS — R55 Syncope and collapse: Secondary | ICD-10-CM | POA: Insufficient documentation

## 2011-10-18 DIAGNOSIS — R079 Chest pain, unspecified: Secondary | ICD-10-CM | POA: Insufficient documentation

## 2011-10-18 DIAGNOSIS — E669 Obesity, unspecified: Secondary | ICD-10-CM | POA: Insufficient documentation

## 2011-10-18 DIAGNOSIS — R0602 Shortness of breath: Secondary | ICD-10-CM | POA: Insufficient documentation

## 2011-10-18 DIAGNOSIS — R112 Nausea with vomiting, unspecified: Secondary | ICD-10-CM | POA: Insufficient documentation

## 2011-10-18 DIAGNOSIS — Z9861 Coronary angioplasty status: Secondary | ICD-10-CM | POA: Insufficient documentation

## 2011-10-18 DIAGNOSIS — R0600 Dyspnea, unspecified: Secondary | ICD-10-CM

## 2011-10-18 DIAGNOSIS — R0989 Other specified symptoms and signs involving the circulatory and respiratory systems: Secondary | ICD-10-CM | POA: Insufficient documentation

## 2011-10-18 DIAGNOSIS — R5381 Other malaise: Secondary | ICD-10-CM | POA: Insufficient documentation

## 2011-10-18 DIAGNOSIS — I10 Essential (primary) hypertension: Secondary | ICD-10-CM | POA: Insufficient documentation

## 2011-10-18 DIAGNOSIS — E785 Hyperlipidemia, unspecified: Secondary | ICD-10-CM | POA: Insufficient documentation

## 2011-10-18 DIAGNOSIS — Z8249 Family history of ischemic heart disease and other diseases of the circulatory system: Secondary | ICD-10-CM | POA: Insufficient documentation

## 2011-10-18 DIAGNOSIS — E119 Type 2 diabetes mellitus without complications: Secondary | ICD-10-CM | POA: Insufficient documentation

## 2011-10-18 DIAGNOSIS — I2510 Atherosclerotic heart disease of native coronary artery without angina pectoris: Secondary | ICD-10-CM

## 2011-10-18 DIAGNOSIS — R0609 Other forms of dyspnea: Secondary | ICD-10-CM | POA: Insufficient documentation

## 2011-10-18 MED ORDER — TECHNETIUM TC 99M TETROFOSMIN IV KIT
11.0000 | PACK | Freq: Once | INTRAVENOUS | Status: AC | PRN
  Administered 2011-10-18: 11 via INTRAVENOUS

## 2011-10-18 MED ORDER — TECHNETIUM TC 99M TETROFOSMIN IV KIT
33.0000 | PACK | Freq: Once | INTRAVENOUS | Status: AC | PRN
  Administered 2011-10-18: 33 via INTRAVENOUS

## 2011-10-18 MED ORDER — REGADENOSON 0.4 MG/5ML IV SOLN
0.4000 mg | Freq: Once | INTRAVENOUS | Status: AC
  Administered 2011-10-18: 0.4 mg via INTRAVENOUS

## 2011-10-18 NOTE — Progress Notes (Addendum)
[facility] SITE 3 NUCLEAR MED [address] [place] [address] [phone]  Cardiology Nuclear Med Study  Marissa [name] is a 71 y.o. female [id number] 1941-06-20   Nuclear Med Background Indication for Stress Test:  Evaluation for Ischemia and PTCA/Stent Patency  History: '05 OZH:YQMVHQ,IO=96%; '06 Echo:EF=60%; 4/09 PTCA/Stent-CFX x 2; 8/09 Cath:Patent Stents, EF=60%; '10 ?MI Cardiac Risk Factors: Family History - CAD, Hypertension, Lipids, NIDDM ("No" per patient) and Obesity  Symptoms:  Chest Pain/Pressure/Tightness with and without Exertion (last episode of chest discomfort was last Thursday), Diaphoresis, Dizziness, DOE/SOB, Fatigue, Nausea, Near Syncope, Rapid HR, and Vomiting   Nuclear Pre-Procedure Caffeine/Decaff Intake:  8:30pm NPO After: 8:30pm   Lungs:  Clear.  O2 SAT 98% IV 0.9% NS with Angio Cath:  24g  IV Site: R Forearm  IV Started by:  Doyne Keel, CNMT  Chest Size (in):  40 Cup Size: B  Height: 5\' 4"  (1.626 m)  Weight:  231 lb (104.781 kg)  BMI:  Body mass index is 39.65 kg/(m^2). Tech Comments:  n/a    Nuclear Med Study 1 or 2 day study: 1 day  Stress Test Type:  Lexiscan  Reading MD: Willa Rough, MD  Order Authorizing Provider:  Marca Ancona, MD  Resting Radionuclide: Technetium 28m Tetrofosmin  Resting Radionuclide Dose: 11.0 mCi   Stress Radionuclide:  Technetium 28m Tetrofosmin  Stress Radionuclide Dose: 33.0 mCi           Stress Protocol Rest HR: 68 Stress HR: 92  Rest BP: 144/86 Stress BP: 160/84  Exercise Time (min): n/a METS: n/a   Predicted Max HR: 150 bpm % Max HR: 61.33 bpm Rate Pressure Product: 29528   Dose of Adenosine (mg):  n/a Dose of Lexiscan: 0.4 mg  Dose of Atropine (mg): n/a Dose of Dobutamine: n/a mcg/kg/min (at max HR)  Stress Test Technologist: Smiley Houseman, CMA-N  Nuclear Technologist:  Domenic Polite, CNMT     Rest Procedure:  Myocardial perfusion imaging was performed at rest 45 minutes  following the intravenous administration of Technetium 86m Tetrofosmin.  Rest ECG: No acute changes.  Stress Procedure:  The patient received IV Lexiscan 0.4 mg over 15-seconds.  Technetium 57m Tetrofosmin injected at 30-seconds.  There were no significant changes with Lexiscan, only nonspecific T-wave changes.  She did c/o chest pressure with Lexiscan.  Quantitative spect images were obtained after a 45 minute delay.  Stress ECG: No significant change from baseline ECG  QPS Raw Data Images:  Normal; no motion artifact; normal heart/lung ratio. Stress Images:  Normal homogeneous uptake in all areas of the myocardium. Rest Images:  Normal homogeneous uptake in all areas of the myocardium. Subtraction (SDS):  No evidence of ischemia. Transient Ischemic Dilatation (Normal <1.22):  1.01 Lung/Heart Ratio (Normal <0.45):  0.27  Quantitative Gated Spect Images QGS EDV:  180 ml QGS ESV:  27 ml QGS cine images:  Normal Wall Motion QGS EF: 67%  Impression Exercise Capacity:  Lexiscan with no exercise. BP Response:  Normal blood pressure response. Clinical Symptoms:  Shortness of breath ECG Impression:  No significant ST segment change suggestive of ischemia. Comparison with Prior Nuclear Study: No significant change from previous study  Overall Impression:  Normal stress nuclear study.  There is no significant change since 2005  Willa Rough, MD   Normal study.  Please tell patient.  Marca Ancona 10/19/2011

## 2011-10-19 ENCOUNTER — Ambulatory Visit (HOSPITAL_COMMUNITY): Payer: PRIVATE HEALTH INSURANCE | Attending: Cardiovascular Disease

## 2011-10-19 ENCOUNTER — Ambulatory Visit (HOSPITAL_COMMUNITY): Payer: PRIVATE HEALTH INSURANCE | Admitting: Radiology

## 2011-10-19 DIAGNOSIS — R0989 Other specified symptoms and signs involving the circulatory and respiratory systems: Secondary | ICD-10-CM | POA: Insufficient documentation

## 2011-10-19 DIAGNOSIS — I10 Essential (primary) hypertension: Secondary | ICD-10-CM | POA: Insufficient documentation

## 2011-10-19 DIAGNOSIS — E785 Hyperlipidemia, unspecified: Secondary | ICD-10-CM | POA: Insufficient documentation

## 2011-10-19 DIAGNOSIS — I2510 Atherosclerotic heart disease of native coronary artery without angina pectoris: Secondary | ICD-10-CM | POA: Insufficient documentation

## 2011-10-19 DIAGNOSIS — R0609 Other forms of dyspnea: Secondary | ICD-10-CM | POA: Insufficient documentation

## 2011-10-19 DIAGNOSIS — E119 Type 2 diabetes mellitus without complications: Secondary | ICD-10-CM | POA: Insufficient documentation

## 2011-10-19 DIAGNOSIS — R072 Precordial pain: Secondary | ICD-10-CM

## 2011-10-19 DIAGNOSIS — R079 Chest pain, unspecified: Secondary | ICD-10-CM | POA: Insufficient documentation

## 2011-10-19 DIAGNOSIS — R0602 Shortness of breath: Secondary | ICD-10-CM

## 2011-10-19 NOTE — Progress Notes (Signed)
LMTCB

## 2011-10-24 NOTE — Progress Notes (Signed)
appt 10/25/11 with Dr Shirlee Latch

## 2011-10-25 ENCOUNTER — Ambulatory Visit (INDEPENDENT_AMBULATORY_CARE_PROVIDER_SITE_OTHER): Payer: PRIVATE HEALTH INSURANCE | Admitting: Cardiology

## 2011-10-25 ENCOUNTER — Encounter: Payer: Self-pay | Admitting: Cardiology

## 2011-10-25 VITALS — BP 141/86 | HR 86 | Ht 64.0 in | Wt 235.0 lb

## 2011-10-25 DIAGNOSIS — N898 Other specified noninflammatory disorders of vagina: Secondary | ICD-10-CM

## 2011-10-25 DIAGNOSIS — I2510 Atherosclerotic heart disease of native coronary artery without angina pectoris: Secondary | ICD-10-CM

## 2011-10-25 DIAGNOSIS — E785 Hyperlipidemia, unspecified: Secondary | ICD-10-CM

## 2011-10-25 DIAGNOSIS — I10 Essential (primary) hypertension: Secondary | ICD-10-CM

## 2011-10-25 DIAGNOSIS — N939 Abnormal uterine and vaginal bleeding, unspecified: Secondary | ICD-10-CM

## 2011-10-25 LAB — BASIC METABOLIC PANEL
Calcium: 9.5 mg/dL (ref 8.4–10.5)
GFR: 54.34 mL/min — ABNORMAL LOW (ref >60.00)
Glucose, Bld: 81 mg/dL (ref 70–99)
Potassium: 3.9 mEq/L (ref 3.5–5.1)
Sodium: 141 mEq/L (ref 135–145)

## 2011-10-25 MED ORDER — AMLODIPINE BESYLATE 10 MG PO TABS: 10.0000 mg | ORAL_TABLET | Freq: Every day | ORAL | Status: DC

## 2011-10-25 NOTE — Progress Notes (Signed)
PCP: Dr. Yetta Barre  71 yo with history of CAD and HTN presents for followup of chest pain.  At last appointment, she had been out of her medications for months.  Her main complaint was several months of chest pain episodes.  These can occur at rest or with exertion.  There was no clear trigger.  They were left-sided and lasted 5-10 minutes typically.  She would also wake up at night several times a week with chest pain.  She has chronic exertional dyspnea after walking about 50 yards or up any incline. No orthopnea or PND.   I had her restart on her BP and cholesterol medications.  BP is better today though still mildly elevated.  She does not check it at home.  She continues to have occasional episodes of nonexertional chest pain.  I had her do a [employer] which showed no evidence for ischemia or infarction.  Echo showed normal EF with moderate LV hypertrophy.  Her main complaint today is actually frequent vaginal bleeding.  This has gone on for months and has not been evaluated.   Labs (1/13): LDL 133, HDL 48, HCT 36.3, K 4.1, creatinine 1.23 => 1.4, BNP 58  PMH: 1. Anemia 2. Diabetes: diet-controlled. 3. HTN 4. GERD 5. History of diverticulitis 6. Hyperlipidemia 7. Schatzski's ring 8. Fibroids 9. CAD: 8/07 had BMS to OM.  In-stent restenosis with later Promus DES to same site.  Lexiscan myoview in 1/13 showed EF 67%, no ischemia or infarction.  Echo (2/13) showed EF 55-60%, moderate LVH, mild MR.   10. Peripheral neuropathy 11. HTN  SH: Single with 1 daughter.  She does not smoke.   FH: No premature CAD  ROS: All systems reviewed and negative except as per HPI.   Current Outpatient Prescriptions  Medication Sig Dispense Refill  . aspirin 81 MG tablet Take 81 mg by mouth daily.        Marland Kitchen lisinopril (PRINIVIL,ZESTRIL) 10 MG tablet Take 1 tablet (10 mg total) by mouth daily.  30 tablet  6  . omeprazole (PRILOSEC) 20 MG capsule Take 1 capsule (20 mg total) by mouth daily.  30 capsule   6  . pravastatin (PRAVACHOL) 40 MG tablet Take 40 mg by mouth daily.      Marland Kitchen DISCONTD: amLODipine (NORVASC) 5 MG tablet Take 1 tablet (5 mg total) by mouth daily.  30 tablet  6  . amLODipine (NORVASC) 10 MG tablet Take 1 tablet (10 mg total) by mouth daily.  30 tablet  11    BP 141/86  Pulse 86  Ht 5\' 4"  (1.626 m)  Wt 106.595 kg (235 lb)  BMI 40.34 kg/m2 General: NAD, obese. Neck: No JVD, no thyromegaly or thyroid nodule.  Lungs: Clear to auscultation bilaterally with normal respiratory effort. CV: Nondisplaced PMI.  Heart regular S1/S2, no S3/S4, no murmur.  No peripheral edema.  No carotid bruit.  2+ DP pulses bilaterally.  Abdomen: Soft, nontender, no hepatosplenomegaly, no distention.  Neurologic: Alert and oriented x 3.  Psych: Normal affect. Extremities: No clubbing or cyanosis.

## 2011-10-25 NOTE — Assessment & Plan Note (Signed)
BP still mildly elevated.  Increase amlodipine to 10 mg daily.

## 2011-10-25 NOTE — Assessment & Plan Note (Signed)
Atypical chest pain is likely noncardiac.  Lexiscan myoview was normal with no ischemia or infarction.  EF normal by myoview and echo.  Continue ASA 81, ACEI, statin.

## 2011-10-25 NOTE — Patient Instructions (Signed)
Increase amlodipine to 10mg  daily. You can take two 5mg  tablets daily.  Your physician recommends that you have  lab work today--BMET 414.01  401.9  Dr Shirlee Latch has recommended that you schedule an appointment with a gynecologist.  Your physician recommends that you return for a FASTING lipid profile in March 2013.  Your physician wants you to follow-up in: 6 months with Dr Shirlee Latch. (August 2013). You will receive a reminder letter in the mail two months in advance. If you don't receive a letter, please call our office to schedule the follow-up appointment.

## 2011-10-25 NOTE — Assessment & Plan Note (Addendum)
Pravastatin restarted.  Check lipids/LFTs in 2 months.  Goal LDL < 70.

## 2011-10-25 NOTE — Assessment & Plan Note (Signed)
Post-menopausal vaginal bleeding has been going on for months now.  This has not been evaluated.  I will refer her to gynecology.

## 2011-11-10 ENCOUNTER — Other Ambulatory Visit: Payer: Self-pay | Admitting: Obstetrics & Gynecology

## 2011-11-10 DIAGNOSIS — N950 Postmenopausal bleeding: Secondary | ICD-10-CM

## 2011-11-16 ENCOUNTER — Ambulatory Visit (HOSPITAL_COMMUNITY)
Admission: RE | Admit: 2011-11-16 | Discharge: 2011-11-16 | Disposition: A | Discharge status: Home / Self Care | Payer: PRIVATE HEALTH INSURANCE | Source: Ambulatory Visit | Attending: Obstetrics & Gynecology | Admitting: Obstetrics & Gynecology

## 2011-11-16 DIAGNOSIS — N950 Postmenopausal bleeding: Secondary | ICD-10-CM | POA: Insufficient documentation

## 2011-11-16 DIAGNOSIS — D251 Intramural leiomyoma of uterus: Secondary | ICD-10-CM | POA: Insufficient documentation

## 2011-11-23 ENCOUNTER — Encounter: Payer: Self-pay | Admitting: Internal Medicine

## 2011-11-23 ENCOUNTER — Other Ambulatory Visit (INDEPENDENT_AMBULATORY_CARE_PROVIDER_SITE_OTHER): Payer: PRIVATE HEALTH INSURANCE

## 2011-11-23 ENCOUNTER — Ambulatory Visit (INDEPENDENT_AMBULATORY_CARE_PROVIDER_SITE_OTHER): Payer: PRIVATE HEALTH INSURANCE | Admitting: Internal Medicine

## 2011-11-23 VITALS — BP 140/82 | HR 70 | Temp 97.3°F | Resp 16 | Wt 229.0 lb

## 2011-11-23 DIAGNOSIS — N259 Disorder resulting from impaired renal tubular function, unspecified: Secondary | ICD-10-CM

## 2011-11-23 DIAGNOSIS — R51 Headache: Secondary | ICD-10-CM

## 2011-11-23 DIAGNOSIS — E785 Hyperlipidemia, unspecified: Secondary | ICD-10-CM

## 2011-11-23 DIAGNOSIS — D529 Folate deficiency anemia, unspecified: Secondary | ICD-10-CM | POA: Insufficient documentation

## 2011-11-23 DIAGNOSIS — I10 Essential (primary) hypertension: Secondary | ICD-10-CM

## 2011-11-23 DIAGNOSIS — D649 Anemia, unspecified: Secondary | ICD-10-CM

## 2011-11-23 DIAGNOSIS — D62 Acute posthemorrhagic anemia: Secondary | ICD-10-CM

## 2011-11-23 DIAGNOSIS — IMO0001 Reserved for inherently not codable concepts without codable children: Secondary | ICD-10-CM

## 2011-11-23 DIAGNOSIS — R519 Headache, unspecified: Secondary | ICD-10-CM | POA: Insufficient documentation

## 2011-11-23 LAB — CBC WITH DIFFERENTIAL/PLATELET
Eosinophils Relative: 2.9 % (ref 0.0–5.0)
HCT: 32.8 % — ABNORMAL LOW (ref 36.0–46.0)
Hemoglobin: 10.1 g/dL — ABNORMAL LOW (ref 12.0–15.0)
Lymphs Abs: 0.9 10*3/uL (ref 0.7–4.0)
Monocytes Relative: 7.1 % (ref 3.0–12.0)
Neutro Abs: 4.5 10*3/uL (ref 1.4–7.7)
WBC: 6 10*3/uL (ref 4.5–10.5)

## 2011-11-23 LAB — FERRITIN: Ferritin: 74.9 ng/mL (ref 10.0–291.0)

## 2011-11-23 LAB — VITAMIN B12: Vitamin B-12: 233 pg/mL (ref 211–911)

## 2011-11-23 LAB — TSH: TSH: 1.53 u[IU]/mL (ref 0.35–5.50)

## 2011-11-23 LAB — HEMOGLOBIN A1C: Hgb A1c MFr Bld: 5.8 % (ref 4.6–6.5)

## 2011-11-23 LAB — CK: Total CK: 149 U/L (ref 7–177)

## 2011-11-23 MED ORDER — OLMESARTAN MEDOXOMIL-HCTZ 20-12.5 MG PO TABS: 1.0000 | ORAL_TABLET | Freq: Every day | ORAL | Status: DC

## 2011-11-23 MED ORDER — FOLIC ACID 1 MG PO TABS: 1.0000 mg | ORAL_TABLET | Freq: Every day | ORAL | Status: AC

## 2011-11-23 NOTE — Assessment & Plan Note (Signed)
Check CMP CPK TSH today

## 2011-11-23 NOTE — Patient Instructions (Signed)
Hypertension As your heart beats, it forces blood through your arteries. This force is your blood pressure. If the pressure is too high, it is called hypertension (HTN) or high blood pressure. HTN is dangerous because you may have it and not know it. High blood pressure may mean that your heart has to work harder to pump blood. Your arteries may be narrow or stiff. The extra work puts you at risk for heart disease, stroke, and other problems.  Blood pressure consists of two numbers, a higher number over a lower, 110/72, for example. It is stated as "110 over 72." The ideal is below 120 for the top number (systolic) and under 80 for the bottom (diastolic). Write down your blood pressure today. You should pay close attention to your blood pressure if you have certain conditions such as:  Heart failure.   Prior heart attack.   Diabetes   Chronic kidney disease.   Prior stroke.   Multiple risk factors for heart disease.  To see if you have HTN, your blood pressure should be measured while you are seated with your arm held at the level of the heart. It should be measured at least twice. A one-time elevated blood pressure reading (especially in the Emergency Department) does not mean that you need treatment. There may be conditions in which the blood pressure is different between your right and left arms. It is important to see your caregiver soon for a recheck. Most people have essential hypertension which means that there is not a specific cause. This type of high blood pressure may be lowered by changing lifestyle factors such as:  Stress.   Smoking.   Lack of exercise.   Excessive weight.   Drug/tobacco/alcohol use.   Eating less salt.  Most people do not have symptoms from high blood pressure until it has caused damage to the body. Effective treatment can often prevent, delay or reduce that damage. TREATMENT  When a cause has been identified, treatment for high blood pressure is  directed at the cause. There are a large number of medications to treat HTN. These fall into several categories, and your caregiver will help you select the medicines that are best for you. Medications may have side effects. You should review side effects with your caregiver. If your blood pressure stays high after you have made lifestyle changes or started on medicines,   Your medication(s) may need to be changed.   Other problems may need to be addressed.   Be certain you understand your prescriptions, and know how and when to take your medicine.   Be sure to follow up with your caregiver within the time frame advised (usually within two weeks) to have your blood pressure rechecked and to review your medications.   If you are taking more than one medicine to lower your blood pressure, make sure you know how and at what times they should be taken. Taking two medicines at the same time can result in blood pressure that is too low.  SEEK IMMEDIATE MEDICAL CARE IF:  You develop a severe headache, blurred or changing vision, or confusion.   You have unusual weakness or numbness, or a faint feeling.   You have severe chest or abdominal pain, vomiting, or breathing problems.  MAKE SURE YOU:   Understand these instructions.   Will watch your condition.   Will get help right away if you are not doing well or get worse.  Document Released: 08/29/2005 Document Revised: 08/18/2011 Document Reviewed:   04/18/2008 Exit[employer] Patient Information 2012 [place], [place].Diabetes, Type 2 Diabetes is a long-lasting (chronic) disease. In type 2 diabetes, the pancreas does not make enough insulin (a hormone), and the body does not respond normally to the insulin that is made. This type of diabetes was also previously called adult-onset diabetes. It usually occurs after the age of 43, but it can occur at any age.  CAUSES  Type 2 diabetes happens because the pancreasis not making enough insulin or your body has  trouble using the insulin that your pancreas does make properly. SYMPTOMS   Drinking more than usual.   Urinating more than usual.   Blurred vision.   Dry, itchy skin.   Frequent infections.   Feeling more tired than usual (fatigue).  DIAGNOSIS The diagnosis of type 2 diabetes is usually made by one of the following tests:  Fasting blood glucose test. You will not eat for at least 8 hours and then take a blood test.   Random blood glucose test. Your blood glucose (sugar) is checked at any time of the day regardless of when you ate.   Oral glucose tolerance test (OGTT). Your blood glucose is measured after you have not eaten (fasted) and then after you drink a glucose containing beverage.  TREATMENT   Healthy eating.   Exercise.   Medicine, if needed.   Monitoring blood glucose.   Seeing your caregiver regularly.  HOME CARE INSTRUCTIONS   Check your blood glucose at least once a day. More frequent monitoring may be necessary, depending on your medicines and on how well your diabetes is controlled. Your caregiver will advise you.   Take your medicine as directed by your caregiver.   Do not smoke.   Make wise food choices. Ask your caregiver for information. Weight loss can improve your diabetes.   Learn about low blood glucose (hypoglycemia) and how to treat it.   Get your eyes checked regularly.   Have a yearly physical exam. Have your blood pressure checked and your blood and urine tested.   Wear a pendant or bracelet saying that you have diabetes.   Check your feet every night for cuts, sores, blisters, and redness. Let your caregiver know if you have any problems.  SEEK MEDICAL CARE IF:   You have problems keeping your blood glucose in target range.   You have problems with your medicines.   You have symptoms of an illness that do not improve after 24 hours.   You have a sore or wound that is not healing.   You notice a change in vision or a new problem  with your vision.   You have a fever.  MAKE SURE YOU:  Understand these instructions.   Will watch your condition.   Will get help right away if you are not doing well or get worse.  Document Released: 08/29/2005 Document Revised: 08/18/2011 Document Reviewed: 02/14/2011 [facility] Patient Information 2012 [place], [place].

## 2011-11-23 NOTE — Assessment & Plan Note (Signed)
Start replacement

## 2011-11-23 NOTE — Assessment & Plan Note (Signed)
Stop amlodipine as it may be contributing the the HA and check an ESR today for look for vasculitis, also will get a CT brain done to look for mass

## 2011-11-23 NOTE — Assessment & Plan Note (Signed)
BMP today

## 2011-11-23 NOTE — Progress Notes (Signed)
Subjective:    Patient ID: Marissa [name], female    DOB: November 09, 1940, 71 y.o.   MRN: [id number]  Headache  This is a chronic problem. The current episode started more than 1 year ago. The problem occurs intermittently. The problem has been unchanged. The pain is located in the bilateral region. The pain does not radiate. The pain quality is similar to prior headaches. The quality of the pain is described as aching. The pain is at a severity of 2/10. The pain is mild. Associated symptoms include numbness ("all over"), tingling ("all over") and weakness ("all over"). Pertinent negatives include no abdominal pain, abnormal behavior, anorexia, back pain, blurred vision, coughing, dizziness, drainage, ear pain, eye pain, eye redness, eye watering, facial sweating, fever, hearing loss, insomnia, loss of balance, muscle aches, nausea, neck pain, phonophobia, photophobia, rhinorrhea, scalp tenderness, seizures, sinus pressure, sore throat, swollen glands, tinnitus, visual change, vomiting or weight loss. The symptoms are aggravated by medications. She has tried nothing for the symptoms. Her past medical history is significant for hypertension.  Hypertension This is a chronic problem. The current episode started more than 1 year ago. The problem is unchanged. The problem is uncontrolled. Associated symptoms include headaches and peripheral edema. Pertinent negatives include no anxiety, blurred vision, chest pain, malaise/fatigue, neck pain, orthopnea, palpitations, PND, shortness of breath or sweats. There are no associated agents to hypertension. Past treatments include calcium channel blockers and ACE inhibitors. The current treatment provides mild improvement. Compliance problems include exercise, diet and medication side effects.   Diabetes She presents for her follow-up diabetic visit. She has type 2 diabetes mellitus. Her disease course has been stable. Hypoglycemia symptoms include headaches. Pertinent  negatives for hypoglycemia include no dizziness, pallor, seizures, speech difficulty, sweats or tremors. Associated symptoms include weakness ("all over"). Pertinent negatives for diabetes include no blurred vision, no chest pain, no fatigue, no foot paresthesias, no foot ulcerations, no polydipsia, no polyphagia, no polyuria, no visual change and no weight loss. There are no hypoglycemic complications. There are no diabetic complications. She is compliant with treatment all of the time. Her weight is stable. She is following a generally healthy diet. Meal planning includes avoidance of concentrated sweets. She has not had a previous visit with a dietician. She never participates in exercise. There is no change in her home blood glucose trend. An ACE inhibitor/angiotensin II receptor blocker is being taken.      Review of Systems  Constitutional: Negative for fever, chills, weight loss, malaise/fatigue, diaphoresis, activity change, appetite change, fatigue and unexpected weight change.  HENT: Negative for hearing loss, ear pain, sore throat, rhinorrhea, neck pain, sinus pressure and tinnitus.   Eyes: Negative.  Negative for blurred vision, photophobia, pain, discharge, redness, itching and visual disturbance.  Respiratory: Negative for apnea, cough, choking, chest tightness, shortness of breath, wheezing and stridor.   Cardiovascular: Negative for chest pain, palpitations, orthopnea, leg swelling and PND.  Gastrointestinal: Negative for nausea, vomiting, abdominal pain, diarrhea, constipation, blood in stool, anal bleeding and anorexia.  Genitourinary: Positive for vaginal bleeding. Negative for polyuria, vaginal discharge and vaginal pain.  Musculoskeletal: Negative for myalgias, back pain, joint swelling, arthralgias and gait problem.  Skin: Negative for color change, pallor, rash and wound.  Neurological: Positive for tingling ("all over"), weakness ("all over"), numbness ("all over") and  headaches. Negative for dizziness, tremors, seizures, syncope, facial asymmetry, speech difficulty, light-headedness and loss of balance.  Hematological: Negative for polydipsia, polyphagia and adenopathy. Does not bruise/bleed easily.  Psychiatric/Behavioral:  Negative.  The patient does not have insomnia.        Objective:   Physical Exam  Vitals reviewed. Constitutional: She is oriented to person, place, and time. She appears well-developed and well-nourished. No distress.  HENT:  Head: Normocephalic and atraumatic.  Mouth/Throat: Oropharynx is clear and moist. No oropharyngeal exudate.  Eyes: Conjunctivae are normal. Right eye exhibits no discharge. Left eye exhibits no discharge. No scleral icterus.  Neck: Normal range of motion. Neck supple. No JVD present. No tracheal deviation present. No thyromegaly present.  Cardiovascular: Normal rate, regular rhythm, normal heart sounds and intact distal pulses.  Exam reveals no gallop and no friction rub.   No murmur heard. Pulmonary/Chest: Effort normal and breath sounds normal. No stridor. No respiratory distress. She has no wheezes. She has no rales. She exhibits no tenderness.  Abdominal: Soft. Bowel sounds are normal. She exhibits no distension and no mass. There is no tenderness. There is no rebound and no guarding.  Musculoskeletal: Normal range of motion. She exhibits edema (trace edema on both LE's). She exhibits no tenderness.  Lymphadenopathy:    She has no cervical adenopathy.  Neurological: She is alert and oriented to person, place, and time. She has normal strength. She displays no atrophy, no tremor and normal reflexes. No cranial nerve deficit or sensory deficit. She exhibits normal muscle tone. She displays a negative Romberg sign. She displays no seizure activity. Coordination and gait normal. She displays no Babinski's sign on the right side. She displays no Babinski's sign on the left side.  Reflex Scores:      Tricep reflexes  are 1+ on the right side and 1+ on the left side.      Bicep reflexes are 1+ on the right side and 1+ on the left side.      Brachioradialis reflexes are 1+ on the right side and 1+ on the left side.      Patellar reflexes are 1+ on the right side and 1+ on the left side.      Achilles reflexes are 1+ on the right side and 1+ on the left side. Skin: Skin is warm and dry. No rash noted. She is not diaphoretic. No erythema. No pallor.  Psychiatric: She has a normal mood and affect. Her behavior is normal. Judgment and thought content normal.     Lab Results  Component Value Date   WBC 6.3 10/06/2011   HGB 11.2* 10/06/2011   HCT 35.1* 10/06/2011   PLT 209.0 10/06/2011   GLUCOSE 81 10/25/2011   CHOL 174 10/06/2011   TRIG 111.0 10/06/2011   HDL 49.70 10/06/2011   LDLCALC 102* 10/06/2011   ALT 12 10/06/2011   AST 18 10/06/2011   NA 141 10/25/2011   K 3.9 10/25/2011   CL 109 10/25/2011   CREATININE 1.3* 10/25/2011   BUN 15 10/25/2011   CO2 24 10/25/2011   TSH 2.12 10/06/2011   INR 0.96 04/15/2011   HGBA1C 5.6 10/06/2011   MICROALBUR 0.50 04/03/2009       Assessment & Plan:

## 2011-11-23 NOTE — Assessment & Plan Note (Signed)
Stop amlodipine due to HA and edema and change to benicar-hct

## 2011-11-23 NOTE — Progress Notes (Signed)
Addended by: Etta Grandchild on: 11/23/2011 05:39 PM   Modules accepted: Orders

## 2011-11-23 NOTE — Assessment & Plan Note (Signed)
CBC recheck today

## 2011-11-24 ENCOUNTER — Other Ambulatory Visit: Payer: PRIVATE HEALTH INSURANCE

## 2011-11-24 DIAGNOSIS — I10 Essential (primary) hypertension: Secondary | ICD-10-CM

## 2011-11-24 DIAGNOSIS — I2510 Atherosclerotic heart disease of native coronary artery without angina pectoris: Secondary | ICD-10-CM

## 2011-11-24 LAB — LIPID PANEL
Cholesterol: 183 mg/dL (ref 0–200)
LDL Cholesterol: 106 mg/dL — ABNORMAL HIGH (ref 0–99)
Triglycerides: 102 mg/dL (ref 0.0–149.0)

## 2011-11-28 ENCOUNTER — Other Ambulatory Visit: Payer: PRIVATE HEALTH INSURANCE

## 2011-11-30 ENCOUNTER — Encounter: Payer: PRIVATE HEALTH INSURANCE | Admitting: Obstetrics & Gynecology

## 2012-01-05 ENCOUNTER — Telehealth: Payer: Self-pay | Admitting: Internal Medicine

## 2012-01-05 DIAGNOSIS — N950 Postmenopausal bleeding: Secondary | ICD-10-CM

## 2012-01-05 DIAGNOSIS — N939 Abnormal uterine and vaginal bleeding, unspecified: Secondary | ICD-10-CM

## 2012-01-05 NOTE — Telephone Encounter (Signed)
done

## 2012-01-05 NOTE — Telephone Encounter (Signed)
Marissa [name] was a No-Show for her OBGYN appt. When she called to reschedule she was told she would need a new referral. She was scheduled with Ugonna Anyanwu at [facility].

## 2012-01-12 ENCOUNTER — Ambulatory Visit (INDEPENDENT_AMBULATORY_CARE_PROVIDER_SITE_OTHER)
Admission: RE | Admit: 2012-01-12 | Discharge: 2012-01-12 | Disposition: A | Discharge status: Home / Self Care | Payer: PRIVATE HEALTH INSURANCE | Source: Ambulatory Visit | Attending: Internal Medicine | Admitting: Internal Medicine

## 2012-01-12 DIAGNOSIS — R51 Headache: Secondary | ICD-10-CM

## 2012-01-30 ENCOUNTER — Telehealth: Payer: Self-pay

## 2012-01-30 NOTE — Telephone Encounter (Signed)
Patient was seen in our office 11/23/11 and referred to gyn. Patient has now seen GYN who wants to proceed with D&C. She has had her CT scan and now needs medical clearance from PCP to proceed. They request this letter faxed to [phone]

## 2012-01-30 NOTE — Telephone Encounter (Signed)
done

## 2012-02-15 ENCOUNTER — Encounter: Payer: Self-pay | Admitting: Internal Medicine

## 2012-02-15 ENCOUNTER — Ambulatory Visit (INDEPENDENT_AMBULATORY_CARE_PROVIDER_SITE_OTHER): Payer: PRIVATE HEALTH INSURANCE | Admitting: Internal Medicine

## 2012-02-15 ENCOUNTER — Ambulatory Visit (INDEPENDENT_AMBULATORY_CARE_PROVIDER_SITE_OTHER)
Admission: RE | Admit: 2012-02-15 | Discharge: 2012-02-15 | Disposition: A | Discharge status: Home / Self Care | Payer: PRIVATE HEALTH INSURANCE | Source: Ambulatory Visit | Attending: Internal Medicine | Admitting: Internal Medicine

## 2012-02-15 VITALS — BP 130/84 | HR 76 | Temp 97.8°F | Resp 16

## 2012-02-15 DIAGNOSIS — M949 Disorder of cartilage, unspecified: Secondary | ICD-10-CM

## 2012-02-15 DIAGNOSIS — M8580 Other specified disorders of bone density and structure, unspecified site: Secondary | ICD-10-CM

## 2012-02-15 DIAGNOSIS — Z23 Encounter for immunization: Secondary | ICD-10-CM

## 2012-02-15 DIAGNOSIS — Z1231 Encounter for screening mammogram for malignant neoplasm of breast: Secondary | ICD-10-CM | POA: Insufficient documentation

## 2012-02-15 DIAGNOSIS — I10 Essential (primary) hypertension: Secondary | ICD-10-CM

## 2012-02-15 DIAGNOSIS — M79609 Pain in unspecified limb: Secondary | ICD-10-CM

## 2012-02-15 DIAGNOSIS — M109 Gout, unspecified: Secondary | ICD-10-CM

## 2012-02-15 DIAGNOSIS — M79671 Pain in right foot: Secondary | ICD-10-CM

## 2012-02-15 MED ORDER — METHYLPREDNISOLONE ACETATE 80 MG/ML IJ SUSP
120.0000 mg | Freq: Once | INTRAMUSCULAR | Status: AC
  Administered 2012-02-15: 120 mg via INTRAMUSCULAR

## 2012-02-15 MED ORDER — COLCHICINE 0.6 MG PO TABS: 0.6000 mg | ORAL_TABLET | Freq: Every day | ORAL | Status: DC

## 2012-02-15 MED ORDER — OXYCODONE-ACETAMINOPHEN 7.5-325 MG PO TABS: 1.0000 | ORAL_TABLET | Freq: Four times a day (QID) | ORAL | Status: AC | PRN

## 2012-02-15 MED ORDER — NAPROXEN 500 MG PO TABS: 500.0000 mg | ORAL_TABLET | Freq: Two times a day (BID) | ORAL | Status: DC

## 2012-02-15 NOTE — Progress Notes (Signed)
Subjective:    Patient ID: Marissa [name], female    DOB: 1941/05/20, 71 y.o.   MRN: [id number]  Arthritis Presents for follow-up visit. She complains of pain and joint swelling. She reports no stiffness or joint warmth. The symptoms have been worsening. Affected location: right foot. Her pain is at a severity of 7/10. Pertinent negatives include no diarrhea, dry eyes, dry mouth, dysuria, fatigue, fever, pain at night, pain while resting, rash, Raynaud's syndrome, uveitis or weight loss. Compliance with total regimen is 0-25%. Compliance problems include psychosocial issues.       Review of Systems  Constitutional: Negative for fever, chills, weight loss, diaphoresis, activity change, appetite change, fatigue and unexpected weight change.  HENT: Negative.   Eyes: Negative.   Respiratory: Negative for apnea, cough, chest tightness, shortness of breath, wheezing and stridor.   Cardiovascular: Negative for chest pain, palpitations and leg swelling.  Gastrointestinal: Negative for nausea, vomiting, abdominal pain, diarrhea, constipation and blood in stool.  Genitourinary: Negative.  Negative for dysuria.  Musculoskeletal: Positive for joint swelling, arthralgias (right foot) and arthritis. Negative for myalgias, back pain, gait problem and stiffness.  Skin: Negative for color change, pallor, rash and wound.  Neurological: Negative.   Hematological: Negative for adenopathy. Does not bruise/bleed easily.  Psychiatric/Behavioral: Negative.        Objective:   Physical Exam  Vitals reviewed. Constitutional: She is oriented to person, place, and time. She appears well-developed and well-nourished. No distress.  HENT:  Head: Normocephalic and atraumatic.  Mouth/Throat: Oropharynx is clear and moist. No oropharyngeal exudate.  Eyes: Conjunctivae are normal. Right eye exhibits no discharge. Left eye exhibits no discharge. No scleral icterus.  Neck: Normal range of motion. Neck supple. No JVD  present. No tracheal deviation present. No thyromegaly present.  Cardiovascular: Normal rate, regular rhythm, normal heart sounds and intact distal pulses.  Exam reveals no gallop and no friction rub.   No murmur heard. Pulmonary/Chest: Effort normal and breath sounds normal. No stridor. No respiratory distress. She has no wheezes. She has no rales. She exhibits no tenderness.  Abdominal: Soft. Bowel sounds are normal. She exhibits no distension and no mass. There is no tenderness. There is no rebound and no guarding.  Musculoskeletal: Normal range of motion. She exhibits no edema.       Right ankle: She exhibits normal range of motion and no swelling.       Right foot: She exhibits tenderness, bony tenderness and swelling. She exhibits normal range of motion, normal capillary refill, no crepitus, no deformity and no laceration.       Feet:  Lymphadenopathy:    She has no cervical adenopathy.  Neurological: She is oriented to person, place, and time.  Skin: Skin is warm and dry. No rash noted. She is not diaphoretic. No erythema. No pallor.  Psychiatric: She has a normal mood and affect. Her behavior is normal. Judgment and thought content normal.      Lab Results  Component Value Date   WBC 6.0 11/23/2011   HGB 10.1* 11/23/2011   HCT 32.8* 11/23/2011   PLT 208.0 11/23/2011   GLUCOSE 81 10/25/2011   CHOL 183 11/23/2011   TRIG 102.0 11/23/2011   HDL 56.50 11/23/2011   LDLCALC 106* 11/23/2011   ALT 12 10/06/2011   AST 18 10/06/2011   NA 141 10/25/2011   K 3.9 10/25/2011   CL 109 10/25/2011   CREATININE 1.3* 10/25/2011   BUN 15 10/25/2011   CO2 24 10/25/2011  TSH 1.53 11/23/2011   INR 0.96 04/15/2011   HGBA1C 5.8 11/23/2011   MICROALBUR 0.50 04/03/2009      Assessment & Plan:

## 2012-02-15 NOTE — Patient Instructions (Signed)
Arthritis - Gout Gout is caused by uric acid crystals forming in the joint. The big toe, foot, ankle, and knee are the joints most often affected.Often uric acid levels in the blood are also elevated. Symptoms develop rapidly, usually over hours. A joint fluid exam may be needed to prove the diagnosis. Acute gout episodes may follow a minor injury, surgery, illness or medication change. Gout occurs more often in men. It tends to be an inherited (passed down from a family member) condition. Your chance of having gout is increased if you take certain medications. These include water pills (diuretics), aspirin, niacin, and cyclosporine. Kidney disease and alcohol consumption may also increase your chances of getting gout. Months or years can pass between gout attacks.  Treatment includes ice, rest and raising the affected limb until the swelling and pain are better.Anti-inflammatory medicine usually brings about dramatic relief of pain, redness and swelling within 2 to 3 days. Other medications can also be effective. Increase your fluid intake. Do not drink alcohol or eat liver, sweetbreads, or sardines. Long-term gout management may require medicine to lower blood uric acid levels, or changing or stopping diuretics. Please see your caregiver if your condition is not better after 1 to 2 days of treatment.  SEEK IMMEDIATE MEDICAL CARE IF:  You have more serious symptoms such as a fever, skin rash, diarrhea, vomiting, or other joint pains. Document Released: 10/06/2004 Document Revised: 08/18/2011 Document Reviewed: 10/20/2008 [employer] Patient Information 2012 [employer]. 

## 2012-02-15 NOTE — Assessment & Plan Note (Signed)
She is due for an updated BMD test

## 2012-02-15 NOTE — Assessment & Plan Note (Signed)
I will check a plain film to look for lytic lesions, djd, occult fracture

## 2012-02-15 NOTE — Assessment & Plan Note (Signed)
Will decrease the pain and swelling with depo-medrol and then ask her to try colchicine, nsaids, and percocet for pain relief

## 2012-02-16 NOTE — Assessment & Plan Note (Signed)
Her BP is well controlled 

## 2012-03-29 ENCOUNTER — Other Ambulatory Visit: Payer: Self-pay | Admitting: Obstetrics & Gynecology

## 2012-04-24 ENCOUNTER — Encounter (HOSPITAL_COMMUNITY): Payer: Self-pay | Admitting: Pharmacist

## 2012-04-27 ENCOUNTER — Encounter (HOSPITAL_COMMUNITY)
Admission: RE | Admit: 2012-04-27 | Discharge: 2012-04-27 | Disposition: A | Discharge status: Home / Self Care | Payer: Medicare Other | Source: Ambulatory Visit | Attending: Obstetrics & Gynecology | Admitting: Obstetrics & Gynecology

## 2012-04-27 ENCOUNTER — Encounter (HOSPITAL_COMMUNITY): Payer: Self-pay

## 2012-04-27 HISTORY — DX: Personal history of other specified conditions: Z87.898

## 2012-04-27 LAB — CBC
Hemoglobin: 11.1 g/dL — ABNORMAL LOW (ref 12.0–15.0)
MCHC: 29.2 g/dL — ABNORMAL LOW (ref 30.0–36.0)
RBC: 5.28 MIL/uL — ABNORMAL HIGH (ref 3.87–5.11)
WBC: 7 10*3/uL (ref 4.0–10.5)

## 2012-04-27 LAB — BASIC METABOLIC PANEL
CO2: 26 mEq/L (ref 19–32)
GFR calc non Af Amer: 49 mL/min — ABNORMAL LOW (ref >90)
Glucose, Bld: 99 mg/dL (ref 70–99)
Potassium: 3.9 mEq/L (ref 3.5–5.1)
Sodium: 136 mEq/L (ref 135–145)

## 2012-04-27 NOTE — Patient Instructions (Addendum)
Your procedure is scheduled on:05/04/12  Enter through the Main Entrance at :1130 am Pick up desk phone and dial [phone] and inform us of your arrival.  Please call [phone] if you have any problems the morning of surgery.  Remember: Do not eat after midnight:Thursday Do not drink after:9am on Friday  Take these meds the morning of surgery with a sip of water:Blood pressure pill and Omeprazole  DO NOT wear jewelry, eye make-up, lipstick,body lotion, or dark fingernail polish. Do not shave for 48 hours prior to surgery.  Patients discharged on the day of surgery will not be allowed to drive home.   Remember to use your Hibiclens as instructed.

## 2012-05-04 ENCOUNTER — Encounter (HOSPITAL_COMMUNITY): Payer: Self-pay | Admitting: Anesthesiology

## 2012-05-04 ENCOUNTER — Encounter (HOSPITAL_COMMUNITY): Payer: Self-pay | Admitting: *Deleted

## 2012-05-04 ENCOUNTER — Ambulatory Visit (HOSPITAL_COMMUNITY)
Admission: RE | Admit: 2012-05-04 | Discharge: 2012-05-04 | Disposition: A | Discharge status: Home / Self Care | Payer: Medicare Other | Source: Ambulatory Visit | Attending: Obstetrics & Gynecology | Admitting: Obstetrics & Gynecology

## 2012-05-04 ENCOUNTER — Ambulatory Visit (HOSPITAL_COMMUNITY): Payer: Medicare Other | Admitting: Anesthesiology

## 2012-05-04 ENCOUNTER — Encounter (HOSPITAL_COMMUNITY)
Admission: RE | Disposition: A | Discharge status: Home / Self Care | Payer: Self-pay | Source: Ambulatory Visit | Attending: Obstetrics & Gynecology

## 2012-05-04 DIAGNOSIS — D259 Leiomyoma of uterus, unspecified: Secondary | ICD-10-CM | POA: Diagnosis present

## 2012-05-04 DIAGNOSIS — N939 Abnormal uterine and vaginal bleeding, unspecified: Secondary | ICD-10-CM

## 2012-05-04 DIAGNOSIS — N950 Postmenopausal bleeding: Secondary | ICD-10-CM | POA: Insufficient documentation

## 2012-05-04 HISTORY — PX: HYSTEROSCOPY W/D&C: SHX1775

## 2012-05-04 SURGERY — DILATATION AND CURETTAGE /HYSTEROSCOPY
Anesthesia: General | Site: Uterus | Wound class: Clean Contaminated

## 2012-05-04 MED ORDER — LIDOCAINE HCL (CARDIAC) 20 MG/ML IV SOLN
INTRAVENOUS | Status: AC
  Filled 2012-05-04: qty 5

## 2012-05-04 MED ORDER — LIDOCAINE HCL (CARDIAC) 20 MG/ML IV SOLN
INTRAVENOUS | Status: DC | PRN
  Administered 2012-05-04: 50 mg via INTRAVENOUS

## 2012-05-04 MED ORDER — PHENYLEPHRINE 40 MCG/ML (10ML) SYRINGE FOR IV PUSH (FOR BLOOD PRESSURE SUPPORT)
PREFILLED_SYRINGE | INTRAVENOUS | Status: AC
  Filled 2012-05-04: qty 10

## 2012-05-04 MED ORDER — GLYCINE 1.5 % IR SOLN
Status: DC | PRN
  Administered 2012-05-04: 3000 mL

## 2012-05-04 MED ORDER — ACETAMINOPHEN 650 MG RE SUPP
650.0000 mg | RECTAL | Status: DC | PRN
  Filled 2012-05-04: qty 1

## 2012-05-04 MED ORDER — FENTANYL CITRATE 0.05 MG/ML IJ SOLN
INTRAMUSCULAR | Status: AC
  Filled 2012-05-04: qty 2

## 2012-05-04 MED ORDER — PROPOFOL 10 MG/ML IV EMUL
INTRAVENOUS | Status: DC | PRN
  Administered 2012-05-04: 130 mg via INTRAVENOUS

## 2012-05-04 MED ORDER — LIDOCAINE HCL 1 % IJ SOLN
INTRAMUSCULAR | Status: DC | PRN
  Administered 2012-05-04: 10 mL

## 2012-05-04 MED ORDER — PROPOFOL 10 MG/ML IV EMUL
INTRAVENOUS | Status: AC
  Filled 2012-05-04: qty 20

## 2012-05-04 MED ORDER — ACETAMINOPHEN 325 MG PO TABS: 650.0000 mg | ORAL_TABLET | ORAL | Status: DC | PRN

## 2012-05-04 MED ORDER — ONDANSETRON HCL 4 MG/2ML IJ SOLN: 4.0000 mg | Freq: Four times a day (QID) | INTRAMUSCULAR | Status: DC | PRN

## 2012-05-04 MED ORDER — LIDOCAINE HCL 0.5 % IJ SOLN
INTRAMUSCULAR | Status: AC
  Filled 2012-05-04: qty 1

## 2012-05-04 MED ORDER — MIDAZOLAM HCL 2 MG/2ML IJ SOLN
INTRAMUSCULAR | Status: AC
  Filled 2012-05-04: qty 2

## 2012-05-04 MED ORDER — ONDANSETRON HCL 4 MG/2ML IJ SOLN
INTRAMUSCULAR | Status: AC
  Filled 2012-05-04: qty 2

## 2012-05-04 MED ORDER — PANTOPRAZOLE SODIUM 40 MG PO TBEC
DELAYED_RELEASE_TABLET | ORAL | Status: AC
  Administered 2012-05-04: 40 mg via ORAL
  Filled 2012-05-04: qty 1

## 2012-05-04 MED ORDER — ONDANSETRON HCL 4 MG/2ML IJ SOLN
INTRAMUSCULAR | Status: DC | PRN
  Administered 2012-05-04: 4 mg via INTRAVENOUS

## 2012-05-04 MED ORDER — FENTANYL CITRATE 0.05 MG/ML IJ SOLN: 25.0000 ug | INTRAMUSCULAR | Status: DC | PRN

## 2012-05-04 MED ORDER — OXYCODONE HCL 5 MG PO TABS: 5.0000 mg | ORAL_TABLET | ORAL | Status: DC | PRN

## 2012-05-04 MED ORDER — FENTANYL CITRATE 0.05 MG/ML IJ SOLN
INTRAMUSCULAR | Status: DC | PRN
  Administered 2012-05-04: 50 ug via INTRAVENOUS

## 2012-05-04 MED ORDER — LACTATED RINGERS IV SOLN
INTRAVENOUS | Status: DC
  Administered 2012-05-04: 12:00:00 via INTRAVENOUS

## 2012-05-04 MED ORDER — SODIUM CHLORIDE 0.9 % IJ SOLN: 3.0000 mL | INTRAMUSCULAR | Status: DC | PRN

## 2012-05-04 MED ORDER — PANTOPRAZOLE SODIUM 40 MG PO TBEC
DELAYED_RELEASE_TABLET | ORAL | Status: AC
  Filled 2012-05-04: qty 1

## 2012-05-04 MED ORDER — PANTOPRAZOLE SODIUM 40 MG PO TBEC
40.0000 mg | DELAYED_RELEASE_TABLET | Freq: Once | ORAL | Status: AC
  Administered 2012-05-04: 40 mg via ORAL

## 2012-05-04 MED ORDER — PHENYLEPHRINE 40 MCG/ML (10ML) SYRINGE FOR IV PUSH (FOR BLOOD PRESSURE SUPPORT)
PREFILLED_SYRINGE | INTRAVENOUS | Status: AC
  Filled 2012-05-04: qty 5

## 2012-05-04 MED ORDER — PHENYLEPHRINE HCL 10 MG/ML IJ SOLN
INTRAMUSCULAR | Status: DC | PRN
  Administered 2012-05-04: 40 ug via INTRAVENOUS
  Administered 2012-05-04: 80 ug via INTRAVENOUS
  Administered 2012-05-04: 40 ug via INTRAVENOUS
  Administered 2012-05-04 (×2): 80 ug via INTRAVENOUS
  Administered 2012-05-04: 40 ug via INTRAVENOUS
  Administered 2012-05-04 (×3): 80 ug via INTRAVENOUS

## 2012-05-04 MED ORDER — OXYCODONE-ACETAMINOPHEN 5-325 MG PO TABS: 2.0000 | ORAL_TABLET | Freq: Four times a day (QID) | ORAL | Status: AC | PRN

## 2012-05-04 SURGICAL SUPPLY — 16 items
CANISTER SUCTION 2500CC (MISCELLANEOUS) ×2 IMPLANT
CATH ROBINSON RED A/P 16FR (CATHETERS) ×2 IMPLANT
CLOTH BEACON ORANGE TIMEOUT ST (SAFETY) ×2 IMPLANT
CONTAINER PREFILL 10% NBF 60ML (FORM) ×3 IMPLANT
ELECT REM PT RETURN 9FT ADLT (ELECTROSURGICAL)
ELECTRODE REM PT RTRN 9FT ADLT (ELECTROSURGICAL) IMPLANT
GLOVE BIO SURGEON STRL SZ 6.5 (GLOVE) ×4 IMPLANT
GLOVE SURG SS PI 7.0 STRL IVOR (GLOVE) ×1 IMPLANT
GOWN PREVENTION PLUS LG XLONG (DISPOSABLE) ×4 IMPLANT
GOWN STRL REIN XL XLG (GOWN DISPOSABLE) ×2 IMPLANT
LOOP ANGLED CUTTING 22FR (CUTTING LOOP) IMPLANT
NDL SPNL 20GX3.5 QUINCKE YW (NEEDLE) IMPLANT
NEEDLE SPNL 20GX3.5 QUINCKE YW (NEEDLE) ×2 IMPLANT
PACK HYSTEROSCOPY LF (CUSTOM PROCEDURE TRAY) ×2 IMPLANT
TOWEL OR 17X24 6PK STRL BLUE (TOWEL DISPOSABLE) ×4 IMPLANT
WATER STERILE IRR 1000ML POUR (IV SOLUTION) ×2 IMPLANT

## 2012-05-04 NOTE — Anesthesia Preprocedure Evaluation (Signed)
Anesthesia Evaluation    Airway       Dental   Pulmonary sleep apnea (non-compliant with CPAP) and Continuous Positive Airway Pressure Ventilation ,          Cardiovascular hypertension, + angina CAD: No sx of CAD, EKG.     Neuro/Psych    GI/Hepatic   Endo/Other  Morbid obesity  Renal/GU      Musculoskeletal   Abdominal   Peds  Hematology   Anesthesia Other Findings   Reproductive/Obstetrics                           Anesthesia Physical Anesthesia Plan  ASA: III  Anesthesia Plan: General   Post-op Pain Management:    Induction: Intravenous  Airway Management Planned: LMA  Additional Equipment:   Intra-op Plan:   Post-operative Plan:   Informed Consent: I have reviewed the patients History and Physical, chart, labs and discussed the procedure including the risks, benefits and alternatives for the proposed anesthesia with the patient or authorized representative who has indicated his/her understanding and acceptance.   Dental Advisory Given  Plan Discussed with: CRNA and Surgeon  Anesthesia Plan Comments: (  Discussed  general anesthesia, including possible nausea, instrumentation of airway, sore throat,pulmonary aspiration, etc. I asked if the were any outstanding questions, or  concerns before we proceeded. )        Anesthesia Quick Evaluation

## 2012-05-04 NOTE — Op Note (Signed)
Preoperative diagnosis: post menopausal bleeding  Postoperative diagnosis: post menopausal bleeding  Procedure: Diagnostic hysteroscopy, dilatation and curettage  Surgeon: Antionette Char A  Anesthesia: Laryngeal mask airway, paracervical block  Estimated blood loss: Minimal  Urine output: per Anesthesiology  IV Fluids: per Anesthesiology   Complications: none  Specimen: PATHOLOGY  Operative Findings: There was wispy appearing endometrium.  There were focal areas of polypoid appearing endometrium.  The curettings had calcified fragments.  The right tubal ostia was seen.  The left tubal ostia was not well visualized.    Description of procedure:   The patient was taken to the operating room and placed on the operating table in the semi-lithotomy position in Van Lear stirrups.  Examination under anesthesia was performed.  The patient was prepped and draped in the usual manner.  After a time-out had been completed, a speculum was placed in the vagina.  The anterior lip of the cervix was grasped with a single-toothed tenaculum.    10 cc of 1% lidocaine were injected at 4 and 7 o'clock to produce a paracervical block.  The uterine cavity sounded to 9 cm.  The endocervical canal was dilated with Shawnie Pons dilators.  A 5 mm diagnostic hysteroscope with Glycine as the distending medium was used to perform a diagnostic hysteroscopy.  The findings are noted above.    The hysteroscope was removed.  A small, Sims curette was used to perform an endometrial curettage.  All the instruments were removed from the vagina.  Final instrument counts were correct.  The patient was taken to the PACU in stable condition.

## 2012-05-04 NOTE — H&P (Signed)
  Chief Complaint: 71 y.o.  who presents with post menopausal bleeding  Details of Present Illness:  Long h/o AUB.  Previous w/u remarkable for uterine fibroids.  BP 173/83  Pulse 69  Temp 97.9 F (36.6 C) (Oral)  Resp 18  SpO2 100%  Past Medical History  Diagnosis Date  . Hypertension   . Renal insufficiency     baseline Cr ~ 1.3  . Hyperlipidemia   . History of DVT (deep vein thrombosis)   . History of colonic polyps   . Gout   . Cardiomegaly   . CAD (coronary artery disease)     s/p stent placement 04/2005 and s/p PTCA in 04/2008 by Dr. Jonelle Sidle.  . Menorrhagia   . Polyarthritis   . Microcytic anemia   . OSA (obstructive sleep apnea)   . GERD (gastroesophageal reflux disease)   . Esophagitis   . Hidradenitis suppurativa     s/p axillary sweat gland removal  . Type II or unspecified type diabetes mellitus without mention of complication, uncontrolled 10/06/2011  . H/O blood transfusion reaction     at [facility] hospital   History   Social History  . Marital Status: Widowed    Spouse Name: N/A    Number of Children: 5  . Years of Education: 12   Occupational History  . Retired    Social History Main Topics  . Smoking status: Never Smoker   . Smokeless tobacco: Never Used  . Alcohol Use: No  . Drug Use: No  . Sexually Active: No   Other Topics Concern  . Not on file   Social History Narrative   Widowed. 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   Family History  Problem Relation Age of Onset  . Diabetes Mother   . Coronary artery disease Mother   . Diabetes Sister   . Coronary artery disease Sister     Pertinent items are noted in HPI.  Pre-Op Diagnosis: post menopausal bleeding- surgery clearance to be faxed to OR   Planned Procedure: Procedure(s): DILATATION AND CURETTAGE /HYSTEROSCOPY  I have reviewed the patient's history and have completed the  physical exam and REID [id number] is acceptable for surgery.  Roseanna Rainbow, MD 05/04/2012 12:06 PM

## 2012-05-04 NOTE — Anesthesia Postprocedure Evaluation (Signed)
  Anesthesia Post-op Note  Patient: Marissa [name]  Procedure(s) Performed: Procedure(s) (LRB): DILATATION AND CURETTAGE /HYSTEROSCOPY (N/A) Patient is awake and responsive. Pain and nausea are reasonably well controlled. Vital signs are stable and clinically acceptable. Oxygen saturation is clinically acceptable. There are no apparent anesthetic complications at this time. Patient is ready for discharge.

## 2012-05-04 NOTE — Transfer of Care (Signed)
Immediate Anesthesia Transfer of Care Note  Patient: Marissa [name]  Procedure(s) Performed: Procedure(s) (LRB): DILATATION AND CURETTAGE /HYSTEROSCOPY (N/A)  Patient Location: PACU  Anesthesia Type: General  Level of Consciousness: sedated  Airway & Oxygen Therapy: Patient Spontanous Breathing and Patient connected to nasal cannula oxygen  Post-op Assessment: Report given to PACU RN  Post vital signs: Reviewed and stable  Complications: No apparent anesthesia complications

## 2012-05-07 ENCOUNTER — Encounter (HOSPITAL_COMMUNITY): Payer: Self-pay | Admitting: Obstetrics & Gynecology

## 2012-05-26 ENCOUNTER — Emergency Department (INDEPENDENT_AMBULATORY_CARE_PROVIDER_SITE_OTHER)
Admission: EM | Admit: 2012-05-26 | Discharge: 2012-05-26 | Disposition: A | Discharge status: Home / Self Care | Payer: Medicaid Other | Source: Home / Self Care | Attending: Family Medicine | Admitting: Family Medicine

## 2012-05-26 ENCOUNTER — Encounter (HOSPITAL_COMMUNITY): Payer: Self-pay | Admitting: Emergency Medicine

## 2012-05-26 DIAGNOSIS — H109 Unspecified conjunctivitis: Secondary | ICD-10-CM

## 2012-05-26 MED ORDER — ERYTHROMYCIN 5 MG/GM OP OINT: TOPICAL_OINTMENT | OPHTHALMIC | Status: AC

## 2012-05-26 MED ORDER — KETOTIFEN FUMARATE 0.025 % OP SOLN: 1.0000 [drp] | Freq: Two times a day (BID) | OPHTHALMIC | Status: AC

## 2012-05-26 NOTE — ED Notes (Signed)
Waiting for discharge summary

## 2012-05-26 NOTE — ED Provider Notes (Signed)
History     CSN: [id number]  Arrival date & time 05/26/12  1105   First MD Initiated Contact with Patient 05/26/12 1106      Chief Complaint  Patient presents with  . Eye Pain    "feels like something is in eye"    (Consider location/radiation/quality/duration/timing/severity/associated sxs/prior treatment) HPI Comments: 71 y/o female with multiple co morbidities here c/o redness and sand sensation in left eye for 2 days. Started as itchiness making patient to rub her eyes initially then became red and now having burning sand like sensation and redness. Noticed yellow discharge this morning. Denies known injury. No episode of foreign body exposure. No visual changes. Does not wears contacts. Right eye with no symptoms. Denies cough or congestion.    Past Medical History  Diagnosis Date  . Hypertension   . Renal insufficiency     baseline Cr ~ 1.3  . Hyperlipidemia   . History of DVT (deep vein thrombosis)   . History of colonic polyps   . Gout   . Cardiomegaly   . CAD (coronary artery disease)     s/p stent placement 04/2005 and s/p PTCA in 04/2008 by Dr. Jonelle Sidle.  . Menorrhagia   . Polyarthritis   . Microcytic anemia   . OSA (obstructive sleep apnea)   . GERD (gastroesophageal reflux disease)   . Esophagitis   . Hidradenitis suppurativa     s/p axillary sweat gland removal  . Type II or unspecified type diabetes mellitus without mention of complication, uncontrolled 10/06/2011  . H/O blood transfusion reaction     at [facility] hospital    Past Surgical History  Procedure Date  . Cervical fusion c-4-5 other   . Cervical laminectomy other   . Tonsillectomy other   . Umbilical hernia repair   . Axillary seat gland removal other   . Right knee arthroscopy other   . Colectomy 2004    left partial for perforation  . Cholecystectomy   . Tubal ligation   . Ptca and stenting     of mid and distal circumflex coronary artery. 1st stent was 04/2006, with drug eluting stent placed  on 12/20/07 (30-50% RCA stenosis). Cardiologist Dr. Jacinto Halim.  . Dilation and curettage of uterus 02/2011  . Hysteroscopy w/d&c 05/04/2012    Procedure: DILATATION AND CURETTAGE /HYSTEROSCOPY;  Surgeon: Antionette Char, MD;  Location: [facility];  Service: Gynecology;  Laterality: N/A;    Family History  Problem Relation Age of Onset  . Diabetes Mother   . Coronary artery disease Mother   . Diabetes Sister   . Coronary artery disease Sister     History  Substance Use Topics  . Smoking status: Never Smoker   . Smokeless tobacco: Never Used  . Alcohol Use: No    OB History    Grav Para Term Preterm Abortions TAB SAB Ect Mult Living                  Review of Systems  Constitutional: Negative for fever and chills.  HENT: Negative for congestion, sore throat, facial swelling and rhinorrhea.   Eyes: Positive for pain, discharge, redness and itching. Negative for visual disturbance.  Respiratory: Negative for cough and shortness of breath.   Skin: Negative for rash.  Neurological: Negative for headaches.  All other systems reviewed and are negative.    Allergies  Amlodipine  Home Medications   Current Outpatient Rx  Name Route Sig Dispense Refill  . ASPIRIN 81 MG PO TABS  Oral Take 81 mg by mouth daily.      . COLCHICINE 0.6 MG PO TABS Oral Take 1 tablet (0.6 mg total) by mouth daily. 180 tablet 3  . FOLIC ACID 1 MG PO TABS Oral Take 1 tablet (1 mg total) by mouth daily. 30 tablet 11  . NAPROXEN 500 MG PO TABS Oral Take 500 mg by mouth 2 (two) times daily as needed. For gout pain    . OLMESARTAN MEDOXOMIL-HCTZ 20-12.5 MG PO TABS Oral Take 1 tablet by mouth daily. 56 tablet 0  . OMEPRAZOLE 20 MG PO CPDR Oral Take 1 capsule (20 mg total) by mouth daily. 30 capsule 6  . PRAVASTATIN SODIUM 40 MG PO TABS Oral Take 40 mg by mouth daily.    . ERYTHROMYCIN 5 MG/GM OP OINT  Place a 1/2 inch ribbon of ointment into the left lower eyelid 3 times a day for 5-7 days. 1 g 0  . KETOTIFEN  FUMARATE 0.025 % OP SOLN Left Eye Place 1 drop into the left eye 2 (two) times daily. 5 mL 0    BP 175/84  Pulse 81  Temp 98.6 F (37 C)  Resp 18  SpO2 97%  Physical Exam  Nursing note and vitals reviewed. Constitutional: She is oriented to person, place, and time. She appears well-developed and well-nourished. No distress.  HENT:  Head: Normocephalic and atraumatic.  Right Ear: External ear normal.  Left Ear: External ear normal.  Nose: Nose normal.  Mouth/Throat: Oropharynx is clear and moist. No oropharyngeal exudate.  Eyes: EOM are normal. Pupils are equal, round, and reactive to light. Left eye exhibits discharge.       Left eye: conjunctival erythema and mild chemosis of nasal conjunctiva. Scant white/yellow discharge. No blepharitis. No ulcers or pulling observed with fluorescein and UV light exam. No periorbital erythema or swelling.   Neck: Neck supple.  Cardiovascular: Normal heart sounds.   Pulmonary/Chest: Breath sounds normal.  Lymphadenopathy:    She has no cervical adenopathy.  Neurological: She is alert and oriented to person, place, and time.  Skin: No rash noted.    ED Course  Procedures (including critical care time)  Labs Reviewed - No data to display No results found.   1. Conjunctivitis       MDM  Treated with erythromycin ointment and ketotifen drops. ophthalmology referral as needed.         Sharin Grave, MD 05/28/12 1042

## 2012-05-26 NOTE — ED Notes (Signed)
Pt states "feels like something is in eye" pain/reddness since Thursday. Denies any other symptoms.

## 2012-06-20 ENCOUNTER — Other Ambulatory Visit: Payer: Self-pay | Admitting: Obstetrics & Gynecology

## 2012-06-20 DIAGNOSIS — N711 Chronic inflammatory disease of uterus: Secondary | ICD-10-CM

## 2012-06-20 DIAGNOSIS — N950 Postmenopausal bleeding: Secondary | ICD-10-CM

## 2012-06-20 DIAGNOSIS — D251 Intramural leiomyoma of uterus: Secondary | ICD-10-CM

## 2012-06-28 ENCOUNTER — Ambulatory Visit (HOSPITAL_COMMUNITY): Admission: RE | Admit: 2012-06-28 | Payer: Medicare Other | Source: Ambulatory Visit

## 2012-06-28 ENCOUNTER — Ambulatory Visit (HOSPITAL_COMMUNITY)
Admission: RE | Admit: 2012-06-28 | Discharge: 2012-06-28 | Disposition: A | Discharge status: Home / Self Care | Payer: Medicare Other | Source: Ambulatory Visit | Attending: Obstetrics & Gynecology | Admitting: Obstetrics & Gynecology

## 2012-06-28 DIAGNOSIS — D259 Leiomyoma of uterus, unspecified: Secondary | ICD-10-CM | POA: Insufficient documentation

## 2012-06-28 DIAGNOSIS — N950 Postmenopausal bleeding: Secondary | ICD-10-CM | POA: Insufficient documentation

## 2012-06-28 DIAGNOSIS — N719 Inflammatory disease of uterus, unspecified: Secondary | ICD-10-CM | POA: Insufficient documentation

## 2012-06-28 MED ORDER — GADOBENATE DIMEGLUMINE 529 MG/ML IV SOLN
20.0000 mL | Freq: Once | INTRAVENOUS | Status: AC | PRN
  Administered 2012-06-28: 20 mL via INTRAVENOUS

## 2012-06-29 ENCOUNTER — Ambulatory Visit (HOSPITAL_COMMUNITY): Admission: RE | Admit: 2012-06-29 | Payer: Medicare Other | Source: Ambulatory Visit

## 2012-06-29 ENCOUNTER — Inpatient Hospital Stay (HOSPITAL_COMMUNITY): Admission: RE | Admit: 2012-06-29 | Payer: Medicare Other | Source: Ambulatory Visit

## 2012-06-29 LAB — POCT I-STAT, CHEM 8
Calcium, Ion: 1.32 mmol/L — ABNORMAL HIGH (ref 1.13–1.30)
HCT: 39 % (ref 36.0–46.0)
Hemoglobin: 13.3 g/dL (ref 12.0–15.0)
TCO2: 23 mmol/L (ref 0–100)

## 2012-07-03 ENCOUNTER — Encounter: Payer: Self-pay | Admitting: Gynecologic Oncology

## 2012-07-03 ENCOUNTER — Ambulatory Visit: Payer: Medicare Other | Attending: Gynecologic Oncology | Admitting: Gynecologic Oncology

## 2012-07-03 VITALS — BP 138/80 | HR 68 | Temp 97.3°F | Resp 20 | Ht 63.66 in | Wt 235.3 lb

## 2012-07-03 DIAGNOSIS — E785 Hyperlipidemia, unspecified: Secondary | ICD-10-CM | POA: Insufficient documentation

## 2012-07-03 DIAGNOSIS — Z8249 Family history of ischemic heart disease and other diseases of the circulatory system: Secondary | ICD-10-CM | POA: Insufficient documentation

## 2012-07-03 DIAGNOSIS — E119 Type 2 diabetes mellitus without complications: Secondary | ICD-10-CM | POA: Insufficient documentation

## 2012-07-03 DIAGNOSIS — I517 Cardiomegaly: Secondary | ICD-10-CM | POA: Insufficient documentation

## 2012-07-03 DIAGNOSIS — K219 Gastro-esophageal reflux disease without esophagitis: Secondary | ICD-10-CM | POA: Insufficient documentation

## 2012-07-03 DIAGNOSIS — G4733 Obstructive sleep apnea (adult) (pediatric): Secondary | ICD-10-CM | POA: Insufficient documentation

## 2012-07-03 DIAGNOSIS — N939 Abnormal uterine and vaginal bleeding, unspecified: Secondary | ICD-10-CM | POA: Insufficient documentation

## 2012-07-03 DIAGNOSIS — Z833 Family history of diabetes mellitus: Secondary | ICD-10-CM | POA: Insufficient documentation

## 2012-07-03 DIAGNOSIS — I10 Essential (primary) hypertension: Secondary | ICD-10-CM | POA: Insufficient documentation

## 2012-07-03 DIAGNOSIS — Z888 Allergy status to other drugs, medicaments and biological substances status: Secondary | ICD-10-CM | POA: Insufficient documentation

## 2012-07-03 DIAGNOSIS — N926 Irregular menstruation, unspecified: Secondary | ICD-10-CM | POA: Insufficient documentation

## 2012-07-03 DIAGNOSIS — I2510 Atherosclerotic heart disease of native coronary artery without angina pectoris: Secondary | ICD-10-CM | POA: Insufficient documentation

## 2012-07-03 NOTE — Progress Notes (Signed)
Consult Note: Gyn-Onc  Consult was requested by Dr. Tamela Oddi for the evaluation of Marissa [name] 71 y.o. female  CC:  Chief Complaint  Patient presents with  . Persist. AUB    New pt    HPI: 05/04/2012 the patient underwent Uterine Dilatation and Curettage. Path  Endometrium, curettage - DEGENERATING ENDOMETRIUM WITH ASSOCIATED ABUNDANT ACUTE AND CHRONIC PLASMACYTIC INFLAMMATION, CONSISTENT WITH ACUTE AND CHRONIC ENDOMETRITIS.- FRAGMENTS OF CALCIFICATIONS. Microscopic Comment There is no evidence of significant cytologic atypia, hyperplasia or malignancy identified in this material.  Pelvic MR 06/2012 IMPRESSION:  1. Multiple small uterine fibroids measuring up to 1.8 cm.  2. Uterine adenomyosis. No abnormal thickening of endometrium  demonstrated.  3. Normal postmenopausal ovaries. No adnexal mass or other  significant abnormality identified.  Current Meds:  Outpatient Encounter Prescriptions as of 07/03/2012  Medication Sig Dispense Refill  . aspirin 81 MG tablet Take 81 mg by mouth daily.        . colchicine 0.6 MG tablet Take 1 tablet (0.6 mg total) by mouth daily.  180 tablet  3  . folic acid (FOLVITE) 1 MG tablet Take 1 tablet (1 mg total) by mouth daily.  30 tablet  11  . naproxen (NAPROSYN) 500 MG tablet Take 500 mg by mouth 2 (two) times daily as needed. For gout pain      . olmesartan-hydrochlorothiazide (BENICAR HCT) 20-12.5 MG per tablet Take 1 tablet by mouth daily.  56 tablet  0  . omeprazole (PRILOSEC) 20 MG capsule Take 1 capsule (20 mg total) by mouth daily.  30 capsule  6  . pravastatin (PRAVACHOL) 40 MG tablet Take 40 mg by mouth daily.        Allergy:  Allergies  Allergen Reactions  . Amlodipine     edema    Social Hx:   History   Social History  . Marital Status: Widowed    Spouse Name: N/A    Number of Children: 5  . Years of Education: 12   Occupational History  . Retired    Social History Main Topics  . Smoking status: Never Smoker    . Smokeless tobacco: Never Used  . Alcohol Use: No  . Drug Use: No  . Sexually Active: No   Other Topics Concern  . Not on file   Social History Narrative   Widowed. 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    Past Surgical Hx:  Past Surgical History  Procedure Date  . Cervical fusion c-4-5 other   . Cervical laminectomy other   . Tonsillectomy other   . Umbilical hernia repair   . Axillary seat gland removal other   . Right knee arthroscopy other   . Colectomy 2004    left partial for perforation  . Cholecystectomy   . Tubal ligation   . Ptca and stenting     of mid and distal circumflex coronary artery. 1st stent was 04/2006, with drug eluting stent placed on 12/20/07 (30-50% RCA stenosis). Cardiologist Dr. Jacinto Halim.  . Dilation and curettage of uterus 02/2011  . Hysteroscopy w/d&c 05/04/2012    Procedure: DILATATION AND CURETTAGE /HYSTEROSCOPY;  Surgeon: Antionette Char, MD;  Location: [facility];  Service: Gynecology;  Laterality: N/A;    Past Medical Hx:  Past Medical History  Diagnosis Date  . Hypertension   . Renal insufficiency     baseline Cr ~ 1.3  . Hyperlipidemia   .  History of DVT (deep vein thrombosis)   . History of colonic polyps   . Gout   . Cardiomegaly   . CAD (coronary artery disease)     s/p stent placement 04/2005 and s/p PTCA in 04/2008 by Dr. Jonelle Sidle.  . Menorrhagia   . Polyarthritis   . Microcytic anemia   . OSA (obstructive sleep apnea)   . GERD (gastroesophageal reflux disease)   . Esophagitis   . Hidradenitis suppurativa     s/p axillary sweat gland removal  . Type II or unspecified type diabetes mellitus without mention of complication, uncontrolled 10/06/2011  . H/O blood transfusion reaction     at [facility] hospital  Diverticulitis.  Past Gynecological History: G6P6 Menarche 15 irregular menses.  Menopause at 40.  No h/o abnormal pap test.  BTL at 30's.  Mammogram remote. Colonoscopy 2006 for diverticular bleed.  Family Hx:  Family History  Problem Relation Age of Onset  . Diabetes Mother   . Coronary artery disease Mother   . Diabetes Sister   . Coronary artery disease Sister     Review of Systems: Constitutional  Feels fatigued. Cardiovascular  Chest pain/pressure this morning without associated diaphoresis.  No shortness of breath.  Intermittent ankle edema  Pulmonary  No cough or wheeze.  Gastro Intestinal  No nausea, vomitting, or diarrhoea. Rectal bleeding last month.  No abdominal pain, change in bowel movement, or constipation.  Genito Urinary  No frequency, urgency, occasional dysuria, vaginal spotting daily. Reports incontinence with urge and frequency Musculo Skeletal  No myalgia, arthralgia, joint swelling or pain  Neurologic  No weakness,reports frequent hand cramping, difficulty occasionally starting movement.  Nightly pins and needles in her hands and feet.Marland Kitchen  Psychology  No depression, anxiety, insomnia.   Vitals:  Temperature 97.3 F (36.3 C), temperature source Oral, height 5' 3.66" (1.617 m), weight 235 lb 4.8 oz (106.731 kg). Body mass index is 40.82 kg/(m^2).   Physical Exam: WD in NAD Neck  Supple NROM, without any enlargements.  Lymph Node Survey No cervical supraclavicular or inguinal adenopathy Cardiovascular  Pulse normal rate, regularity and rhythm. S1 and S2 normal.  Lungs  Clear to auscultation bilateraly, Good air movement.  Skin  No rash/lesions/breakdown  Psychiatry  Alert and oriented to person, place, and time  Abdomen  Normoactive bowel sounds, abdomen soft, non-tender and obese. Ventral wall laxity.  Vertical midline incision without evidence of hernia.  Back No CVA tenderness Genito Urinary  Vulva/vagina: Normal external female genitalia. Capacious vagina, atrophic with sidewall prolapse and grade 1 cystocele,   No lesions. No discharge or  bleeding.  Bladder/urethra:  No lesions or masses  Cervix: Normal appearing, 3 cm, no lesions.  Uterus:  no parametrial involvement or nodularity, unable to assess size because of obesity.  Adnexa:unable to palpate Rectal  Good tone, no masses no cul de sac nodularity.  Extremities  No bilateral cyanosis, clubbing or edema.   Assessment/Plan:  Marissa [name]  is a 71 y.o.  year old with persistent bleeding of the uterus without any evidence of hyperplasia or neoplasia.Marland Kitchen  tiny suggest chronic endometritis. The source of endometritis is unclear but may be related to her diverticular disease. This patient once all that her options are surgical or supportive care . I do not believe that a Mirena IUD would be helpful in this situation.  The patient is tired of the vaginal bleeding and desires definitive management. Her abdominal incision is notable for a rather large abdominal scar reflective  of a colon resection because of a perforation secondary to diverticular disease and a subsequent hernia repair.  Also that she may have significant adhesive disease that may preclude a successful minimally invasive approach. She's also made aware that the adhesive disease may warrant an open hysterectomy with even the possibility of not being able to complete this procedure.  The risks and benefits of the surgery were discussed with the patient inclusive of infection bleeding damage to surrounding structures perforation of diverticula prolonged hospitalization and reoperation and development of a hernia at the site of the midline incision.  She is aware that this procedure may be needed to be performed via laparotomy and may not be feasible if there is scarring.  At this visit she reports chest pain yesterday and today which she attributes to the stress of the upcoming visit. I've advised her to followup with her primary care practitioner for medical clearance. This procedure will be performed showed an knee  with Dr. Antionette Char. We will contact the patient regarding the date of surgery.   Laurette Schimke, MD, PhD 07/03/2012, 9:13 AM

## 2012-07-03 NOTE — Patient Instructions (Signed)
Medical clearance by primary care practitioner.  Is scheduled for minimally invasive robotic hysterectomy bilateral salpingo-oophorectomy with the possibility of an open laparotomy and extensive adhesive lysis.  She regarding a surgical date  Thank you very much Ms. Shann Medal for allowing me to provide care for you today.  I appreciate your confidence in choosing our Gynecologic Oncology team.  If you have any questions about your visit today please call our office and we will get back to you as soon as possible.  Maryclare Labrador. Janki Dike MD., PhD Gynecologic Oncology

## 2012-07-09 ENCOUNTER — Ambulatory Visit (INDEPENDENT_AMBULATORY_CARE_PROVIDER_SITE_OTHER): Payer: Medicare Other | Admitting: Internal Medicine

## 2012-07-09 ENCOUNTER — Encounter: Payer: Self-pay | Admitting: Internal Medicine

## 2012-07-09 VITALS — BP 124/78 | HR 68 | Temp 98.6°F | Resp 16 | Wt 235.2 lb

## 2012-07-09 DIAGNOSIS — I10 Essential (primary) hypertension: Secondary | ICD-10-CM

## 2012-07-09 DIAGNOSIS — Z23 Encounter for immunization: Secondary | ICD-10-CM

## 2012-07-09 NOTE — Assessment & Plan Note (Signed)
Her BP is well controlled 

## 2012-07-09 NOTE — Patient Instructions (Signed)

## 2012-07-09 NOTE — Progress Notes (Signed)
  Subjective:    Patient ID: Marissa [name], female    DOB: November 15, 1940, 71 y.o.   MRN: [id number]  Hypertension This is a chronic problem. The current episode started more than 1 year ago. The problem has been gradually improving since onset. The problem is controlled. Pertinent negatives include no anxiety, blurred vision, chest pain, headaches, malaise/fatigue, neck pain, orthopnea, palpitations, peripheral edema, PND, shortness of breath or sweats. Past treatments include central alpha agonists, angiotensin blockers and diuretics. The current treatment provides significant improvement. Compliance problems include exercise and diet.       Review of Systems  Constitutional: Negative for fever, chills, malaise/fatigue, diaphoresis, activity change, appetite change, fatigue and unexpected weight change.  HENT: Negative.  Negative for neck pain.   Eyes: Negative.  Negative for blurred vision.  Respiratory: Negative for cough, chest tightness, shortness of breath, wheezing and stridor.   Cardiovascular: Negative for chest pain, palpitations, orthopnea, leg swelling and PND.  Gastrointestinal: Negative for nausea, vomiting, abdominal pain, diarrhea and constipation.  Genitourinary: Negative.   Musculoskeletal: Positive for arthralgias (knees). Negative for myalgias, back pain, joint swelling and gait problem.  Skin: Negative for color change, pallor, rash and wound.  Neurological: Negative for dizziness, tremors, seizures, syncope, facial asymmetry, speech difficulty, weakness, light-headedness, numbness and headaches.  Hematological: Negative for adenopathy. Does not bruise/bleed easily.  Psychiatric/Behavioral: Negative.        Objective:   Physical Exam  Vitals reviewed. Constitutional: She is oriented to person, place, and time. She appears well-developed and well-nourished. No distress.  HENT:  Head: Normocephalic and atraumatic.  Mouth/Throat: No oropharyngeal exudate.  Eyes:  Conjunctivae normal are normal. Right eye exhibits no discharge. Left eye exhibits no discharge. No scleral icterus.  Neck: Normal range of motion. Neck supple. No JVD present. No tracheal deviation present. No thyromegaly present.  Cardiovascular: Normal rate, regular rhythm, normal heart sounds and intact distal pulses.  Exam reveals no gallop and no friction rub.   No murmur heard. Pulmonary/Chest: Effort normal and breath sounds normal. No stridor. No respiratory distress. She has no wheezes. She has no rales. She exhibits no tenderness.  Abdominal: Soft. Bowel sounds are normal. She exhibits no distension and no mass. There is no tenderness. There is no rebound and no guarding.  Musculoskeletal: Normal range of motion. She exhibits no edema and no tenderness.  Lymphadenopathy:    She has no cervical adenopathy.  Neurological: She is oriented to person, place, and time.  Skin: Skin is warm and dry. No rash noted. She is not diaphoretic. No erythema. No pallor.  Psychiatric: She has a normal mood and affect. Her behavior is normal. Judgment and thought content normal.      Lab Results  Component Value Date   WBC 7.0 04/27/2012   HGB 13.3 06/28/2012   HCT 39.0 06/28/2012   PLT 258 04/27/2012   GLUCOSE 91 06/28/2012   CHOL 183 11/23/2011   TRIG 102.0 11/23/2011   HDL 56.50 11/23/2011   LDLCALC 106* 11/23/2011   ALT 12 10/06/2011   AST 18 10/06/2011   NA 144 06/28/2012   K 4.2 06/28/2012   CL 111 06/28/2012   CREATININE 1.20* 06/28/2012   BUN 24* 06/28/2012   CO2 26 04/27/2012   TSH 1.53 11/23/2011   INR 0.96 04/15/2011   HGBA1C 5.8 11/23/2011   MICROALBUR 0.50 04/03/2009      Assessment & Plan:

## 2012-07-20 ENCOUNTER — Ambulatory Visit (HOSPITAL_COMMUNITY)
Admission: RE | Admit: 2012-07-20 | Discharge: 2012-07-20 | Disposition: A | Discharge status: Home / Self Care | Payer: Medicare Other | Source: Ambulatory Visit | Attending: Obstetrics & Gynecology | Admitting: Obstetrics & Gynecology

## 2012-07-20 ENCOUNTER — Encounter (HOSPITAL_COMMUNITY): Payer: Self-pay

## 2012-07-20 ENCOUNTER — Encounter (HOSPITAL_COMMUNITY)
Admission: RE | Admit: 2012-07-20 | Discharge: 2012-07-20 | Disposition: A | Discharge status: Home / Self Care | Payer: Medicare Other | Source: Ambulatory Visit | Attending: Obstetrics & Gynecology | Admitting: Obstetrics & Gynecology

## 2012-07-20 DIAGNOSIS — Z01818 Encounter for other preprocedural examination: Secondary | ICD-10-CM | POA: Insufficient documentation

## 2012-07-20 DIAGNOSIS — Z01812 Encounter for preprocedural laboratory examination: Secondary | ICD-10-CM | POA: Insufficient documentation

## 2012-07-20 HISTORY — DX: Headache: R51

## 2012-07-20 HISTORY — DX: Disease of intestine, unspecified: K63.9

## 2012-07-20 HISTORY — DX: Postmenopausal bleeding: N95.0

## 2012-07-20 HISTORY — DX: Diverticulitis of intestine, part unspecified, without perforation or abscess without bleeding: K57.92

## 2012-07-20 HISTORY — DX: Sleep disorder, unspecified: G47.9

## 2012-07-20 LAB — CBC WITH DIFFERENTIAL/PLATELET
Basophils Relative: 0 % (ref 0–1)
Eosinophils Absolute: 0.1 10*3/uL (ref 0.0–0.7)
HCT: 36.6 % (ref 36.0–46.0)
Hemoglobin: 11.3 g/dL — ABNORMAL LOW (ref 12.0–15.0)
Lymphocytes Relative: 20 % (ref 12–46)
MCH: 21.2 pg — ABNORMAL LOW (ref 26.0–34.0)
MCHC: 30.9 g/dL (ref 30.0–36.0)
Neutro Abs: 4.7 10*3/uL (ref 1.7–7.7)

## 2012-07-20 LAB — COMPREHENSIVE METABOLIC PANEL
Alkaline Phosphatase: 90 U/L (ref 39–117)
BUN: 16 mg/dL (ref 6–23)
CO2: 23 mEq/L (ref 19–32)
Chloride: 103 mEq/L (ref 96–112)
GFR calc Af Amer: 50 mL/min — ABNORMAL LOW (ref >90)
GFR calc non Af Amer: 43 mL/min — ABNORMAL LOW (ref >90)
Glucose, Bld: 84 mg/dL (ref 70–99)
Potassium: 3.9 mEq/L (ref 3.5–5.1)
Total Bilirubin: 0.2 mg/dL — ABNORMAL LOW (ref 0.3–1.2)
Total Protein: 8.6 g/dL — ABNORMAL HIGH (ref 6.0–8.3)

## 2012-07-20 NOTE — Patient Instructions (Addendum)
JANNAH GUARDIOLA  07/20/2012                           YOUR PROCEDURE IS SCHEDULED ON:  07/24/12 AT 7:15 AM               PLEASE REPORT TO SHORT STAY CENTER AT :  5:15 AM               CALL THIS NUMBER IF ANY PROBLEMS THE DAY OF SURGERY :               [phone]                   START CLEAR LIQUID DIET 24 HRS BEFORE SURGERY STOP LIQUIDS AT MIDNIGHT               REMEMBER:   Do not eat food or drink liquids AFTER MIDNIGHT   Take these medicines the morning of surgery with A SIP OF WATER:  OMEPRAZOLE / PRAVASTATIN  ( DO NOT TAKE BLOOD PRESSURE MEDICINE THE MORNING OF SURGERY)   Do not wear jewelry, make-up   Do not wear lotions, powders, or perfumes.   Do not shave legs or underarms 12 hrs. before surgery (men may shave face)  Do not bring valuables to the hospital.  Contacts, dentures or bridgework may not be worn into surgery.  Leave suitcase in the car. After surgery it may be brought to your room.  For patients admitted to the hospital more than one night, checkout time is 11:00                          The day of discharge.   Patients discharged the day of surgery will not be allowed to drive home                             If going home same day of surgery, must have someone stay with you first                           24 hrs at home and arrange for some one to drive you home from hospital.    Special Instructions:   Please read over the following fact sheets that you were given:               1. MRSA  INFORMATION                      2. [place] PREPARING FOR SURGERY SHEET               3. INCENTIVE SPIROMETER               4. BRING C-PAP MASK AND TUBING TO HOSPITAL                                               X_____________________________________________________________________

## 2012-07-23 NOTE — Anesthesia Preprocedure Evaluation (Addendum)
Anesthesia Evaluation  Patient identified by MRN, date of birth, ID band Patient awake    Reviewed: Allergy & Precautions, H&P , NPO status , Patient's Chart, lab work & pertinent test results  History of Anesthesia Complications (+) AWARENESS UNDER ANESTHESIA  Airway Mallampati: III TM Distance: >3 FB Neck ROM: full    Dental  (+) Edentulous Upper and Dental Advisory Given   Pulmonary shortness of breath and with exertion, sleep apnea ,  breath sounds clear to auscultation  Pulmonary exam normal       Cardiovascular Exercise Tolerance: Good hypertension, Pt. on medications + CAD and + Cardiac Stents Rhythm:regular Rate:Normal  Stent 8/06.  PTCA 8/09   Neuro/Psych Cervical fusion negative neurological ROS  negative psych ROS   GI/Hepatic negative GI ROS, Neg liver ROS, GERD-  Medicated and Controlled,  Endo/Other  Morbid obesityNo meds  Renal/GU Renal InsufficiencyRenal diseaseVery mild renal insufficiency with baseline Cr 1.3  negative genitourinary   Musculoskeletal   Abdominal (+) + obese,   Peds  Hematology negative hematology ROS (+)   Anesthesia Other Findings   Reproductive/Obstetrics negative OB ROS                       Anesthesia Physical Anesthesia Plan  ASA: III  Anesthesia Plan: General   Post-op Pain Management:    Induction: Intravenous  Airway Management Planned: Oral ETT  Additional Equipment:   Intra-op Plan:   Post-operative Plan: Extubation in OR  Informed Consent: I have reviewed the patients History and Physical, chart, labs and discussed the procedure including the risks, benefits and alternatives for the proposed anesthesia with the patient or authorized representative who has indicated his/her understanding and acceptance.   Dental Advisory Given  Plan Discussed with: CRNA and Surgeon  Anesthesia Plan Comments:         Anesthesia Quick  Evaluation

## 2012-07-24 ENCOUNTER — Encounter (HOSPITAL_COMMUNITY): Payer: Self-pay | Admitting: Anesthesiology

## 2012-07-24 ENCOUNTER — Encounter (HOSPITAL_COMMUNITY)
Admission: RE | Disposition: A | Discharge status: Home / Self Care | Payer: Self-pay | Source: Ambulatory Visit | Attending: Obstetrics & Gynecology

## 2012-07-24 ENCOUNTER — Inpatient Hospital Stay (HOSPITAL_COMMUNITY)
Admission: RE | Admit: 2012-07-24 | Discharge: 2012-07-29 | DRG: 742 | Disposition: A | Discharge status: Home / Self Care | Payer: Medicare Other | Source: Ambulatory Visit | Attending: Obstetrics & Gynecology | Admitting: Obstetrics & Gynecology

## 2012-07-24 ENCOUNTER — Inpatient Hospital Stay (HOSPITAL_COMMUNITY): Payer: Medicare Other | Admitting: Anesthesiology

## 2012-07-24 ENCOUNTER — Encounter (HOSPITAL_COMMUNITY): Payer: Self-pay | Admitting: *Deleted

## 2012-07-24 DIAGNOSIS — Z1231 Encounter for screening mammogram for malignant neoplasm of breast: Secondary | ICD-10-CM

## 2012-07-24 DIAGNOSIS — I2510 Atherosclerotic heart disease of native coronary artery without angina pectoris: Secondary | ICD-10-CM | POA: Diagnosis present

## 2012-07-24 DIAGNOSIS — N8000 Endometriosis of the uterus, unspecified: Secondary | ICD-10-CM | POA: Diagnosis present

## 2012-07-24 DIAGNOSIS — Z79899 Other long term (current) drug therapy: Secondary | ICD-10-CM

## 2012-07-24 DIAGNOSIS — D529 Folate deficiency anemia, unspecified: Secondary | ICD-10-CM

## 2012-07-24 DIAGNOSIS — G4733 Obstructive sleep apnea (adult) (pediatric): Secondary | ICD-10-CM | POA: Diagnosis present

## 2012-07-24 DIAGNOSIS — IMO0001 Reserved for inherently not codable concepts without codable children: Secondary | ICD-10-CM | POA: Diagnosis present

## 2012-07-24 DIAGNOSIS — N939 Abnormal uterine and vaginal bleeding, unspecified: Secondary | ICD-10-CM | POA: Diagnosis present

## 2012-07-24 DIAGNOSIS — E785 Hyperlipidemia, unspecified: Secondary | ICD-10-CM | POA: Diagnosis present

## 2012-07-24 DIAGNOSIS — N719 Inflammatory disease of uterus, unspecified: Secondary | ICD-10-CM | POA: Diagnosis present

## 2012-07-24 DIAGNOSIS — Z86718 Personal history of other venous thrombosis and embolism: Secondary | ICD-10-CM

## 2012-07-24 DIAGNOSIS — I10 Essential (primary) hypertension: Secondary | ICD-10-CM | POA: Diagnosis present

## 2012-07-24 DIAGNOSIS — I517 Cardiomegaly: Secondary | ICD-10-CM | POA: Diagnosis present

## 2012-07-24 DIAGNOSIS — E871 Hypo-osmolality and hyponatremia: Secondary | ICD-10-CM | POA: Diagnosis not present

## 2012-07-24 DIAGNOSIS — M130 Polyarthritis, unspecified: Secondary | ICD-10-CM | POA: Diagnosis present

## 2012-07-24 DIAGNOSIS — Z8719 Personal history of other diseases of the digestive system: Secondary | ICD-10-CM

## 2012-07-24 DIAGNOSIS — N259 Disorder resulting from impaired renal tubular function, unspecified: Secondary | ICD-10-CM

## 2012-07-24 DIAGNOSIS — M109 Gout, unspecified: Secondary | ICD-10-CM | POA: Diagnosis present

## 2012-07-24 DIAGNOSIS — K660 Peritoneal adhesions (postprocedural) (postinfection): Secondary | ICD-10-CM | POA: Diagnosis present

## 2012-07-24 DIAGNOSIS — K219 Gastro-esophageal reflux disease without esophagitis: Secondary | ICD-10-CM | POA: Diagnosis present

## 2012-07-24 DIAGNOSIS — D259 Leiomyoma of uterus, unspecified: Principal | ICD-10-CM | POA: Diagnosis present

## 2012-07-24 DIAGNOSIS — M8580 Other specified disorders of bone density and structure, unspecified site: Secondary | ICD-10-CM

## 2012-07-24 DIAGNOSIS — Z5331 Laparoscopic surgical procedure converted to open procedure: Secondary | ICD-10-CM

## 2012-07-24 DIAGNOSIS — Z9049 Acquired absence of other specified parts of digestive tract: Secondary | ICD-10-CM

## 2012-07-24 HISTORY — PX: SALPINGOOPHORECTOMY: SHX82

## 2012-07-24 HISTORY — PX: ROBOTIC ASSISTED TOTAL HYSTERECTOMY WITH BILATERAL SALPINGO OOPHERECTOMY: SHX6086

## 2012-07-24 HISTORY — PX: ABDOMINAL HYSTERECTOMY: SHX81

## 2012-07-24 LAB — TYPE AND SCREEN
ABO/RH(D): B POS
ABO/RH(D): B POS
Antibody Screen: NEGATIVE

## 2012-07-24 SURGERY — ROBOTIC ASSISTED TOTAL HYSTERECTOMY WITH BILATERAL SALPINGO OOPHORECTOMY
Anesthesia: General | Site: Abdomen | Laterality: Bilateral

## 2012-07-24 MED ORDER — DIPHENHYDRAMINE HCL 50 MG/ML IJ SOLN: 12.5000 mg | Freq: Four times a day (QID) | INTRAMUSCULAR | Status: DC | PRN

## 2012-07-24 MED ORDER — STERILE WATER FOR IRRIGATION IR SOLN
Status: DC | PRN
  Administered 2012-07-24: 1000 mL

## 2012-07-24 MED ORDER — ROCURONIUM BROMIDE 100 MG/10ML IV SOLN
INTRAVENOUS | Status: DC | PRN
  Administered 2012-07-24: 50 mg via INTRAVENOUS

## 2012-07-24 MED ORDER — OLMESARTAN MEDOXOMIL-HCTZ 20-12.5 MG PO TABS: 1.0000 | ORAL_TABLET | Freq: Every morning | ORAL | Status: DC

## 2012-07-24 MED ORDER — HYDROMORPHONE HCL PF 1 MG/ML IJ SOLN
INTRAMUSCULAR | Status: DC | PRN
  Administered 2012-07-24: 0.5 mg via INTRAVENOUS
  Administered 2012-07-24: 1 mg via INTRAVENOUS

## 2012-07-24 MED ORDER — LACTATED RINGERS IV SOLN
INTRAVENOUS | Status: DC
  Administered 2012-07-24: 07:00:00 via INTRAVENOUS

## 2012-07-24 MED ORDER — HYDRALAZINE HCL 20 MG/ML IJ SOLN
INTRAMUSCULAR | Status: DC | PRN
  Administered 2012-07-24: 4 mg via INTRAVENOUS

## 2012-07-24 MED ORDER — LACTATED RINGERS IV SOLN: INTRAVENOUS | Status: DC

## 2012-07-24 MED ORDER — IRBESARTAN 150 MG PO TABS
150.0000 mg | ORAL_TABLET | Freq: Every day | ORAL | Status: DC
  Administered 2012-07-25 – 2012-07-29 (×5): 150 mg via ORAL
  Filled 2012-07-24 (×5): qty 1

## 2012-07-24 MED ORDER — PROPOFOL 10 MG/ML IV BOLUS
INTRAVENOUS | Status: DC | PRN
  Administered 2012-07-24: 170 mg via INTRAVENOUS

## 2012-07-24 MED ORDER — NALOXONE HCL 0.4 MG/ML IJ SOLN: 0.4000 mg | INTRAMUSCULAR | Status: DC | PRN

## 2012-07-24 MED ORDER — HYDROMORPHONE HCL PF 1 MG/ML IJ SOLN
0.2500 mg | INTRAMUSCULAR | Status: DC | PRN
  Administered 2012-07-24 (×3): 0.5 mg via INTRAVENOUS

## 2012-07-24 MED ORDER — OXYCODONE-ACETAMINOPHEN 5-325 MG PO TABS
1.0000 | ORAL_TABLET | ORAL | Status: DC | PRN
  Administered 2012-07-27: 1 via ORAL
  Administered 2012-07-28: 2 via ORAL
  Filled 2012-07-24: qty 1
  Filled 2012-07-24: qty 2

## 2012-07-24 MED ORDER — HYDROCHLOROTHIAZIDE 12.5 MG PO CAPS
12.5000 mg | ORAL_CAPSULE | Freq: Every day | ORAL | Status: DC
  Administered 2012-07-25 – 2012-07-29 (×5): 12.5 mg via ORAL
  Filled 2012-07-24 (×5): qty 1

## 2012-07-24 MED ORDER — FENTANYL CITRATE 0.05 MG/ML IJ SOLN
INTRAMUSCULAR | Status: DC | PRN
  Administered 2012-07-24: 50 ug via INTRAVENOUS
  Administered 2012-07-24 (×2): 100 ug via INTRAVENOUS

## 2012-07-24 MED ORDER — KCL IN DEXTROSE-NACL 20-5-0.45 MEQ/L-%-% IV SOLN
INTRAVENOUS | Status: DC
  Administered 2012-07-24 (×2): via INTRAVENOUS
  Administered 2012-07-25 (×2): 1000 mL via INTRAVENOUS
  Administered 2012-07-26: 125 mL via INTRAVENOUS
  Administered 2012-07-26: 10:00:00 via INTRAVENOUS
  Administered 2012-07-27 – 2012-07-28 (×2): 50 mL/h via INTRAVENOUS
  Administered 2012-07-29: 01:00:00 via INTRAVENOUS
  Filled 2012-07-24 (×12): qty 1000

## 2012-07-24 MED ORDER — NEOSTIGMINE METHYLSULFATE 1 MG/ML IJ SOLN
INTRAMUSCULAR | Status: DC | PRN
  Administered 2012-07-24: 5 mg via INTRAVENOUS

## 2012-07-24 MED ORDER — PANTOPRAZOLE SODIUM 40 MG PO TBEC
40.0000 mg | DELAYED_RELEASE_TABLET | Freq: Every day | ORAL | Status: DC
  Administered 2012-07-24 – 2012-07-29 (×6): 40 mg via ORAL
  Filled 2012-07-24 (×6): qty 1

## 2012-07-24 MED ORDER — ONDANSETRON HCL 4 MG/2ML IJ SOLN: 4.0000 mg | Freq: Four times a day (QID) | INTRAMUSCULAR | Status: DC | PRN

## 2012-07-24 MED ORDER — MIDAZOLAM HCL 5 MG/5ML IJ SOLN
INTRAMUSCULAR | Status: DC | PRN
  Administered 2012-07-24: 1 mg via INTRAVENOUS

## 2012-07-24 MED ORDER — ACETAMINOPHEN 10 MG/ML IV SOLN
1000.0000 mg | Freq: Four times a day (QID) | INTRAVENOUS | Status: AC
  Administered 2012-07-24 – 2012-07-25 (×3): 1000 mg via INTRAVENOUS
  Filled 2012-07-24 (×4): qty 100

## 2012-07-24 MED ORDER — HYDROMORPHONE 0.3 MG/ML IV SOLN
INTRAVENOUS | Status: DC
  Administered 2012-07-24: 11:00:00 via INTRAVENOUS
  Administered 2012-07-24: 0.599 mg via INTRAVENOUS
  Administered 2012-07-24: 1.39 mg via INTRAVENOUS
  Administered 2012-07-25: 0.799 mg via INTRAVENOUS
  Administered 2012-07-25: 0.599 mg via INTRAVENOUS
  Administered 2012-07-25: 0.799 mg via INTRAVENOUS
  Administered 2012-07-25: 0.399 mg via INTRAVENOUS
  Administered 2012-07-25: 0.8 mg via INTRAVENOUS
  Administered 2012-07-25: 0.4 mg via INTRAVENOUS
  Administered 2012-07-26: 0.199 mg via INTRAVENOUS
  Administered 2012-07-26: 0.4 mg via INTRAVENOUS
  Administered 2012-07-26 (×2): 0.799 mg via INTRAVENOUS
  Administered 2012-07-26: 03:00:00 via INTRAVENOUS
  Administered 2012-07-26: 0.2 mg via INTRAVENOUS
  Administered 2012-07-26: 0.4 mg via INTRAVENOUS
  Administered 2012-07-27: 0.6 mg via INTRAVENOUS
  Administered 2012-07-27: 0.2 mg via INTRAVENOUS
  Filled 2012-07-24: qty 25

## 2012-07-24 MED ORDER — HYDROMORPHONE HCL PF 1 MG/ML IJ SOLN
INTRAMUSCULAR | Status: AC
  Filled 2012-07-24: qty 1

## 2012-07-24 MED ORDER — ACETAMINOPHEN 10 MG/ML IV SOLN
INTRAVENOUS | Status: DC | PRN
  Administered 2012-07-24: 1000 mg via INTRAVENOUS

## 2012-07-24 MED ORDER — ZOLPIDEM TARTRATE 5 MG PO TABS: 5.0000 mg | ORAL_TABLET | Freq: Every evening | ORAL | Status: DC | PRN

## 2012-07-24 MED ORDER — ENOXAPARIN SODIUM 40 MG/0.4ML ~~LOC~~ SOLN
40.0000 mg | SUBCUTANEOUS | Status: AC
  Administered 2012-07-24: 40 mg via SUBCUTANEOUS
  Filled 2012-07-24: qty 0.4

## 2012-07-24 MED ORDER — SUCCINYLCHOLINE CHLORIDE 20 MG/ML IJ SOLN
INTRAMUSCULAR | Status: DC | PRN
  Administered 2012-07-24: 100 mg via INTRAVENOUS

## 2012-07-24 MED ORDER — DIPHENHYDRAMINE HCL 12.5 MG/5ML PO ELIX: 12.5000 mg | ORAL_SOLUTION | Freq: Four times a day (QID) | ORAL | Status: DC | PRN

## 2012-07-24 MED ORDER — HYDRALAZINE HCL 20 MG/ML IJ SOLN
5.0000 mg | INTRAMUSCULAR | Status: DC | PRN
  Administered 2012-07-24 (×3): 5 mg via INTRAVENOUS

## 2012-07-24 MED ORDER — LIDOCAINE HCL (CARDIAC) 20 MG/ML IV SOLN
INTRAVENOUS | Status: DC | PRN
  Administered 2012-07-24: 50 mg via INTRAVENOUS

## 2012-07-24 MED ORDER — BUPIVACAINE LIPOSOME 1.3 % IJ SUSP
20.0000 mL | INTRAMUSCULAR | Status: AC
  Administered 2012-07-24: 20 mL
  Filled 2012-07-24: qty 20

## 2012-07-24 MED ORDER — GLYCOPYRROLATE 0.2 MG/ML IJ SOLN
INTRAMUSCULAR | Status: DC | PRN
  Administered 2012-07-24: 0.6 mg via INTRAVENOUS

## 2012-07-24 MED ORDER — CEFAZOLIN SODIUM-DEXTROSE 2-3 GM-% IV SOLR
2.0000 g | INTRAVENOUS | Status: AC
  Administered 2012-07-24: 2 g via INTRAVENOUS

## 2012-07-24 MED ORDER — ONDANSETRON HCL 4 MG/2ML IJ SOLN
INTRAMUSCULAR | Status: DC | PRN
  Administered 2012-07-24: 4 mg via INTRAVENOUS

## 2012-07-24 MED ORDER — MAGNESIUM HYDROXIDE 400 MG/5ML PO SUSP
30.0000 mL | Freq: Three times a day (TID) | ORAL | Status: AC
  Administered 2012-07-24 – 2012-07-25 (×3): 30 mL via ORAL
  Filled 2012-07-24 (×3): qty 30

## 2012-07-24 MED ORDER — ESMOLOL HCL 10 MG/ML IV SOLN
INTRAVENOUS | Status: DC | PRN
  Administered 2012-07-24: 5 mg via INTRAVENOUS
  Administered 2012-07-24: 15 mg via INTRAVENOUS

## 2012-07-24 MED ORDER — SODIUM CHLORIDE 0.9 % IJ SOLN: 9.0000 mL | INTRAMUSCULAR | Status: DC | PRN

## 2012-07-24 MED ORDER — HYDRALAZINE HCL 20 MG/ML IJ SOLN
INTRAMUSCULAR | Status: AC
  Filled 2012-07-24: qty 1

## 2012-07-24 MED ORDER — ONDANSETRON HCL 4 MG PO TABS
4.0000 mg | ORAL_TABLET | Freq: Four times a day (QID) | ORAL | Status: DC | PRN
  Administered 2012-07-25: 4 mg via ORAL
  Filled 2012-07-24: qty 1

## 2012-07-24 SURGICAL SUPPLY — 69 items
ADH SKN CLS APL DERMABOND .7 (GAUZE/BANDAGES/DRESSINGS) ×6
APL SKNCLS STERI-STRIP NONHPOA (GAUZE/BANDAGES/DRESSINGS) ×3
BAG SPEC RTRVL LRG 6X4 10 (ENDOMECHANICALS) ×6
BENZOIN TINCTURE PRP APPL 2/3 (GAUZE/BANDAGES/DRESSINGS) ×4 IMPLANT
BINDER ABD UNIV 12 45-62 (WOUND CARE) IMPLANT
BINDER ABDOMINAL 46IN 62IN (WOUND CARE) ×4
CHLORAPREP W/TINT 26ML (MISCELLANEOUS) ×5 IMPLANT
CLOTH BEACON ORANGE TIMEOUT ST (SAFETY) ×4 IMPLANT
CORDS BIPOLAR (ELECTRODE) ×4 IMPLANT
COVER MAYO STAND STRL (DRAPES) ×4 IMPLANT
COVER SURGICAL LIGHT HANDLE (MISCELLANEOUS) ×4 IMPLANT
COVER TIP SHEARS 8 DVNC (MISCELLANEOUS) ×3 IMPLANT
COVER TIP SHEARS 8MM DA VINCI (MISCELLANEOUS) ×1
DECANTER SPIKE VIAL GLASS SM (MISCELLANEOUS) ×4 IMPLANT
DERMABOND ADVANCED (GAUZE/BANDAGES/DRESSINGS) ×2
DERMABOND ADVANCED .7 DNX12 (GAUZE/BANDAGES/DRESSINGS) IMPLANT
DISSECTOR ROUND CHERRY 3/8 STR (MISCELLANEOUS) ×1 IMPLANT
DRAPE LG THREE QUARTER DISP (DRAPES) ×8 IMPLANT
DRAPE SURG IRRIG POUCH 19X23 (DRAPES) ×4 IMPLANT
DRAPE TABLE BACK 44X90 PK DISP (DRAPES) ×8 IMPLANT
DRAPE UTILITY XL STRL (DRAPES) ×4 IMPLANT
DRAPE WARM FLUID 44X44 (DRAPE) ×5 IMPLANT
DRESSING TELFA ISLAND 4X8 (GAUZE/BANDAGES/DRESSINGS) ×1 IMPLANT
DRSG TEGADERM 6X8 (GAUZE/BANDAGES/DRESSINGS) ×8 IMPLANT
ELECT BLADE 6.5 EXT (BLADE) ×1 IMPLANT
ELECT REM PT RETURN 9FT ADLT (ELECTROSURGICAL) ×4
ELECTRODE REM PT RTRN 9FT ADLT (ELECTROSURGICAL) ×3 IMPLANT
FILTER SMOKE EVAC LAPAROSHD (FILTER) ×4 IMPLANT
GAUZE VASELINE 3X9 (GAUZE/BANDAGES/DRESSINGS) IMPLANT
GLOVE BIO SURGEON STRL SZ 6.5 (GLOVE) ×16 IMPLANT
GLOVE BIO SURGEON STRL SZ7.5 (GLOVE) ×8 IMPLANT
GLOVE INDICATOR 8.0 STRL GRN (GLOVE) ×8 IMPLANT
GOWN PREVENTION PLUS XLARGE (GOWN DISPOSABLE) ×20 IMPLANT
GOWN STRL REIN XL XLG (GOWN DISPOSABLE) ×8 IMPLANT
HOLDER FOLEY CATH W/STRAP (MISCELLANEOUS) ×4 IMPLANT
KIT ACCESSORY DA VINCI DISP (KITS) ×1
KIT ACCESSORY DVNC DISP (KITS) ×3 IMPLANT
MANIPULATOR UTERINE 4.5 ZUMI (MISCELLANEOUS) ×4 IMPLANT
OCCLUDER COLPOPNEUMO (BALLOONS) ×4 IMPLANT
PACK LAPAROSCOPY W LONG (CUSTOM PROCEDURE TRAY) ×4 IMPLANT
PENCIL BUTTON HOLSTER BLD 10FT (ELECTRODE) ×1 IMPLANT
POUCH SPECIMEN RETRIEVAL 10MM (ENDOMECHANICALS) ×8 IMPLANT
SET TUBE IRRIG SUCTION NO TIP (IRRIGATION / IRRIGATOR) ×4 IMPLANT
SHEET LAVH (DRAPES) ×4 IMPLANT
SOLUTION ELECTROLUBE (MISCELLANEOUS) ×4 IMPLANT
SPONGE LAP 18X18 X RAY DECT (DISPOSABLE) ×3 IMPLANT
STRIP CLOSURE SKIN 1/2X4 (GAUZE/BANDAGES/DRESSINGS) ×4 IMPLANT
SUT MNCRL AB 4-0 PS2 18 (SUTURE) ×1 IMPLANT
SUT PDS AB 0 CT1 36 (SUTURE) ×2 IMPLANT
SUT PDS AB 0 CTX 60 (SUTURE) ×2 IMPLANT
SUT VIC AB 0 CT1 27 (SUTURE) ×4
SUT VIC AB 0 CT1 27XBRD ANTBC (SUTURE) ×3 IMPLANT
SUT VIC AB 0 CT1 36 (SUTURE) ×8 IMPLANT
SUT VIC AB 4-0 PS2 27 (SUTURE) ×8 IMPLANT
SUT VICRYL 0 UR6 27IN ABS (SUTURE) ×4 IMPLANT
SUT VICRYL 2 0 18  UND BR (SUTURE) ×1
SUT VICRYL 2 0 18 UND BR (SUTURE) IMPLANT
SYR 50ML LL SCALE MARK (SYRINGE) ×4 IMPLANT
SYR BULB IRRIGATION 50ML (SYRINGE) ×1 IMPLANT
TAPE STRIPS DRAPE STRL (GAUZE/BANDAGES/DRESSINGS) ×1 IMPLANT
TOWEL OR 17X26 10 PK STRL BLUE (TOWEL DISPOSABLE) ×8 IMPLANT
TRAP SPECIMEN MUCOUS 40CC (MISCELLANEOUS) IMPLANT
TRAY FOLEY CATH 14FRSI W/METER (CATHETERS) ×4 IMPLANT
TROCAR 12M 150ML BLUNT (TROCAR) ×4 IMPLANT
TROCAR BLADELESS OPT 5 75 (ENDOMECHANICALS) ×4 IMPLANT
TROCAR ENDOPATH XCEL 12X100 BL (ENDOMECHANICALS) ×4 IMPLANT
TROCAR XCEL 12X100 BLDLESS (ENDOMECHANICALS) ×4 IMPLANT
TUBING CONNECTING 10 (TUBING) ×1 IMPLANT
WATER STERILE IRR 1500ML POUR (IV SOLUTION) ×8 IMPLANT

## 2012-07-24 NOTE — Progress Notes (Signed)
Vital signs changed to every 5 minutes.

## 2012-07-24 NOTE — Progress Notes (Signed)
Apresoline repeated for elevated blood pressure. 

## 2012-07-24 NOTE — Progress Notes (Signed)
Utilization review completed.  

## 2012-07-24 NOTE — Interval H&P Note (Signed)
History and Physical Interval Note:  07/24/2012 7:06 AM  Marissa [name]  has presented today for surgery, with the diagnosis of PERSISTANT ABNORMAL UTERINE BLEEDING   The various methods of treatment have been discussed with the patient and family. After consideration of risks, benefits and other options for treatment, the patient has consented to  Procedure(s) (LRB) with comments: ROBOTIC ASSISTED TOTAL HYSTERECTOMY WITH BILATERAL SALPINGO OOPHORECTOMY (Bilateral) ROBOTIC PELVIC AND PARA-AORTIC LYMPH NODE DISSECTION (Bilateral) - POSSIBLE LYMPHADENECTOMY  as a surgical intervention .  The patient's history has been reviewed, patient examined, no change in status, stable for surgery.  I have reviewed the patient's chart and labs.  Questions were answered to the patient's satisfaction.     [place], [facility]

## 2012-07-24 NOTE — Transfer of Care (Signed)
Immediate Anesthesia Transfer of Care Note  Patient: Marissa [name]  Procedure(s) Performed: Procedure(s) (LRB) with comments: ROBOTIC ASSISTED TOTAL HYSTERECTOMY WITH BILATERAL SALPINGO OOPHORECTOMY (Bilateral) - attempted ,  converted to abdomnial hysterectomy HYSTERECTOMY ABDOMINAL () SALPINGO OOPHORECTOMY (Bilateral)  Patient Location: PACU  Anesthesia Type:General  Level of Consciousness: awake, alert , oriented and patient cooperative  Airway & Oxygen Therapy: Patient Spontanous Breathing and Patient connected to face mask oxygen  Post-op Assessment: Report given to PACU RN, Post -op Vital signs reviewed and stable and Patient moving all extremities  Post vital signs: Reviewed and stable  Complications: No apparent anesthesia complications

## 2012-07-24 NOTE — Progress Notes (Signed)
Bllod pressure now 141/88

## 2012-07-24 NOTE — Preoperative (Signed)
Beta Blockers   Reason not to administer Beta Blockers:Not Applicable 

## 2012-07-24 NOTE — Op Note (Signed)
Preoperative Diagnosis: Post menopausal bleeding`  Postoperative Diagnosis: Myometrial abscess, post menopausal bleeding  Procedure(s) Performed:Diagnostic  Laparoscopy , Total abdominal  hysterectomy, Bilateral salpingo oophorectomy,  Extensive enterolysis.  Anesthesia: GET  Surgeon: Maryclare Labrador.  Nelly Rout, M.D. PhD  Assistant Surgeon:Lisa Tamela Oddi  MD.   Assistant: Telford Nab RN, MSN  Indication for Procedure: This is a 52 age-old who underwent prior hysteroscopy  and uterine curettage. Pathology was notable for endometritis.  Patient continued to have post menopausal bleeding.   Operative Findings:  Dense uper and lower abdominal adhesions with bowel adherent to mesh.  Normal appearing adnexae  Frozen pathology was consistent with calcified fibroid and myometrial abscess  Procedure: Patient was taken to the operating room and placed under general endotracheal anesthesia without any difficulty. She is placed in the dorsal lithotomy position and secured to the operative table over the chest with tape.   The patient was prepped and draped and the uterine manipulator placed within the endometrial cavity. The appropriately sized Koh ring was circumferentially around the cervix. The balloon was placed within the vagina. An OG tube was present and functional. At an area on the left in line with the nipple approximately 3 cm below the ribs  a 5 mm Optiview inserted under direct visualization. The abdomen was insufflated to 15 mm of mercury and the pressure never deviated above that throughout the remainder of the procedure. Maximum Trendelenburg positioning was obtained.  The adhesions were so extensive that it completely obscured  visualization of the pelvis. It was not possible to place another port.  A decision was made to proceed with an open procedure.  The uterne manipulator and Burt ring were removed.     A vertical  incision was made and carried through the subcutaneous tissue to the  fascia from the symphysis pubis to the inferior umbilicus.  The fascial incision was made and extended superiorly and inferiorly . The rectus muscles were separated. The peritoneum was identified and carefully entered. Peritoneal incision was extended longitudinally. The above findings were noted. Approximately one hour of enterolysis was performed in order to remove the bowel for the anterior and lateral pelvis.  Superiorly the bowel was adherent to the mesh.  A Bookwalter  retractor was placed and bowel was packed away   The round ligaments were identified and transected with the bovie. The anterior peritoneal reflection was incised and the bladder was dissected off the lower uterine segment. The retroperitoneal space was explored and the ureters were identified bilaterally. The right IP ligament was isolated and transected.  Two 0 vicryl  ligature and  suture ligature  were placed.  The left IP grasped and transected. A free ligature and  suture ligature with 0-0-Vicryl were placed.  Hemostasis  was observed. The uterine vessels were skeletonized, then clamped, cut and suture ligated with 0-Vicryl suture. Serial pedicles of the cardinal and utero-sacral ligaments were clamped, cut, and suture ligated with 0-Vicryl. Entrance was made into the vagina and the cervix amputated from the vagina.  The vaginal cuff was then closed in the standard [facility] fashion with 0 PDS suture.   The pelvis was copiously irrigated. Hemostasis was observed.  The retractor and all packing were removed from the abdomen. The fascia was approximated in a mass closure with 0 loop PDS, with sutures overlapping in the midline The subcutaneous layer was irrigated.The subcutaneous tissue was infiltrated with Inspiril.   The skin was approximated with interrupted staples.    Instrument, sponge, and needle counts  were correct prior to abdominal closure and at the conclusion of the case.   Specimens: Uterus cervix,Bilateral ovaries and  tubes  Estimated Blood Loss: .   Complications:none, Foley draining blood tinged urine.           Disposition: Recovery room in stable condition.         Condition: Stable

## 2012-07-24 NOTE — Progress Notes (Signed)
Apresoline begun for elevated blood pressure.

## 2012-07-24 NOTE — Progress Notes (Signed)
Dr. Nelly Rout made aware of patient's urine being bloody in Foley catheter.

## 2012-07-24 NOTE — Anesthesia Postprocedure Evaluation (Signed)
  Anesthesia Post-op Note  Patient: Marissa [name]  Procedure(s) Performed: Procedure(s) (LRB): ROBOTIC ASSISTED TOTAL HYSTERECTOMY WITH BILATERAL SALPINGO OOPHORECTOMY (Bilateral) HYSTERECTOMY ABDOMINAL () SALPINGO OOPHORECTOMY (Bilateral)  Patient Location: PACU  Anesthesia Type: General  Level of Consciousness: awake and alert   Airway and Oxygen Therapy: Patient Spontanous Breathing  Post-op Pain: mild  Post-op Assessment: Post-op Vital signs reviewed, Patient's Cardiovascular Status Stable, Respiratory Function Stable, Patent Airway and No signs of Nausea or vomiting  Post-op Vital Signs: stable  Complications: No apparent anesthesia complications

## 2012-07-24 NOTE — H&P (View-Only) (Signed)
Consult Note: Gyn-Onc  Consult was requested by Dr. Jackson Moore for the evaluation of Marissa [name] 71 y.o. female  CC:  Chief Complaint  Patient presents with  . Persist. AUB    New pt    HPI: 05/04/2012 the patient underwent Uterine Dilatation and Curettage. Path  Endometrium, curettage - DEGENERATING ENDOMETRIUM WITH ASSOCIATED ABUNDANT ACUTE AND CHRONIC PLASMACYTIC INFLAMMATION, CONSISTENT WITH ACUTE AND CHRONIC ENDOMETRITIS.- FRAGMENTS OF CALCIFICATIONS. Microscopic Comment There is no evidence of significant cytologic atypia, hyperplasia or malignancy identified in this material.  Pelvic MR 06/2012 IMPRESSION:  1. Multiple small uterine fibroids measuring up to 1.8 cm.  2. Uterine adenomyosis. No abnormal thickening of endometrium  demonstrated.  3. Normal postmenopausal ovaries. No adnexal mass or other  significant abnormality identified.  Current Meds:  Outpatient Encounter Prescriptions as of 07/03/2012  Medication Sig Dispense Refill  . aspirin 81 MG tablet Take 81 mg by mouth daily.        . colchicine 0.6 MG tablet Take 1 tablet (0.6 mg total) by mouth daily.  180 tablet  3  . folic acid (FOLVITE) 1 MG tablet Take 1 tablet (1 mg total) by mouth daily.  30 tablet  11  . naproxen (NAPROSYN) 500 MG tablet Take 500 mg by mouth 2 (two) times daily as needed. For gout pain      . olmesartan-hydrochlorothiazide (BENICAR HCT) 20-12.5 MG per tablet Take 1 tablet by mouth daily.  56 tablet  0  . omeprazole (PRILOSEC) 20 MG capsule Take 1 capsule (20 mg total) by mouth daily.  30 capsule  6  . pravastatin (PRAVACHOL) 40 MG tablet Take 40 mg by mouth daily.        Allergy:  Allergies  Allergen Reactions  . Amlodipine     edema    Social Hx:   History   Social History  . Marital Status: Widowed    Spouse Name: N/A    Number of Children: 5  . Years of Education: 12   Occupational History  . Retired    Social History Main Topics  . Smoking status: Never Smoker    . Smokeless tobacco: Never Used  . Alcohol Use: No  . Drug Use: No  . Sexually Active: No   Other Topics Concern  . Not on file   Social History Narrative   Widowed. 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    Past Surgical Hx:  Past Surgical History  Procedure Date  . Cervical fusion c-4-5 other   . Cervical laminectomy other   . Tonsillectomy other   . Umbilical hernia repair   . Axillary seat gland removal other   . Right knee arthroscopy other   . Colectomy 2004    left partial for perforation  . Cholecystectomy   . Tubal ligation   . Ptca and stenting     of mid and distal circumflex coronary artery. 1st stent was 04/2006, with drug eluting stent placed on 12/20/07 (30-50% RCA stenosis). Cardiologist Dr. Ganji.  . Dilation and curettage of uterus 02/2011  . Hysteroscopy w/d&c 05/04/2012    Procedure: DILATATION AND CURETTAGE /HYSTEROSCOPY;  Surgeon: Lisa Jackson-Moore, MD;  Location: [facility];  Service: Gynecology;  Laterality: N/A;    Past Medical Hx:  Past Medical History  Diagnosis Date  . Hypertension   . Renal insufficiency     baseline Cr ~ 1.3  . Hyperlipidemia   .   History of DVT (deep vein thrombosis)   . History of colonic polyps   . Gout   . Cardiomegaly   . CAD (coronary artery disease)     s/p stent placement 04/2005 and s/p PTCA in 04/2008 by Dr. Garji.  . Menorrhagia   . Polyarthritis   . Microcytic anemia   . OSA (obstructive sleep apnea)   . GERD (gastroesophageal reflux disease)   . Esophagitis   . Hidradenitis suppurativa     s/p axillary sweat gland removal  . Type II or unspecified type diabetes mellitus without mention of complication, uncontrolled 10/06/2011  . H/O blood transfusion reaction     at [place]  Diverticulitis.  Past Gynecological History: G6P6 Menarche 15 irregular menses.  Menopause at 40.  No h/o abnormal pap test.  BTL at 30's.  Mammogram remote. Colonoscopy 2006 for diverticular bleed.  Family Hx:  Family History  Problem Relation Age of Onset  . Diabetes Mother   . Coronary artery disease Mother   . Diabetes Sister   . Coronary artery disease Sister     Review of Systems: Constitutional  Feels fatigued. Cardiovascular  Chest pain/pressure this morning without associated diaphoresis.  No shortness of breath.  Intermittent ankle edema  Pulmonary  No cough or wheeze.  Gastro Intestinal  No nausea, vomitting, or diarrhoea. Rectal bleeding last month.  No abdominal pain, change in bowel movement, or constipation.  Genito Urinary  No frequency, urgency, occasional dysuria, vaginal spotting daily. Reports incontinence with urge and frequency Musculo Skeletal  No myalgia, arthralgia, joint swelling or pain  Neurologic  No weakness,reports frequent hand cramping, difficulty occasionally starting movement.  Nightly pins and needles in her hands and feet..  Psychology  No depression, anxiety, insomnia.   Vitals:  Temperature 97.3 F (36.3 C), temperature source Oral, height 5' 3.66" (1.617 m), weight 235 lb 4.8 oz (106.731 kg). Body mass index is 40.82 kg/(m^2).   Physical Exam: WD in NAD Neck  Supple NROM, without any enlargements.  Lymph Node Survey No cervical supraclavicular or inguinal adenopathy Cardiovascular  Pulse normal rate, regularity and rhythm. S1 and S2 normal.  Lungs  Clear to auscultation bilateraly, Good air movement.  Skin  No rash/lesions/breakdown  Psychiatry  Alert and oriented to person, place, and time  Abdomen  Normoactive bowel sounds, abdomen soft, non-tender and obese. Ventral wall laxity.  Vertical midline incision without evidence of hernia.  Back No CVA tenderness Genito Urinary  Vulva/vagina: Normal external female genitalia. Capacious vagina, atrophic with sidewall prolapse and grade 1 cystocele,   No lesions. No discharge or  bleeding.  Bladder/urethra:  No lesions or masses  Cervix: Normal appearing, 3 cm, no lesions.  Uterus:  no parametrial involvement or nodularity, unable to assess size because of obesity.  Adnexa:unable to palpate Rectal  Good tone, no masses no cul de sac nodularity.  Extremities  No bilateral cyanosis, clubbing or edema.   Assessment/Plan:  Ms. Marissa [name]  is a 71 y.o.  year old with persistent bleeding of the uterus without any evidence of hyperplasia or neoplasia..  tiny suggest chronic endometritis. The source of endometritis is unclear but may be related to her diverticular disease. This patient once all that her options are surgical or supportive care . I do not believe that a Mirena IUD would be helpful in this situation.  The patient is tired of the vaginal bleeding and desires definitive management. Her abdominal incision is notable for a rather large abdominal scar reflective   of a colon resection because of a perforation secondary to diverticular disease and a subsequent hernia repair.  Also that she may have significant adhesive disease that may preclude a successful minimally invasive approach. She's also made aware that the adhesive disease may warrant an open hysterectomy with even the possibility of not being able to complete this procedure.  The risks and benefits of the surgery were discussed with the patient inclusive of infection bleeding damage to surrounding structures perforation of diverticula prolonged hospitalization and reoperation and development of a hernia at the site of the midline incision.  She is aware that this procedure may be needed to be performed via laparotomy and may not be feasible if there is scarring.  At this visit she reports chest pain yesterday and today which she attributes to the stress of the upcoming visit. I've advised her to followup with her primary care practitioner for medical clearance. This procedure will be performed showed an knee  with Dr. Lisa Jackson-Moore. We will contact the patient regarding the date of surgery.   Geron Mulford, MD, PhD 07/03/2012, 9:13 AM   

## 2012-07-24 NOTE — Progress Notes (Signed)
Dr. Rica Mast made aware of patient's blood pressures- orders given.

## 2012-07-25 ENCOUNTER — Encounter (HOSPITAL_COMMUNITY): Payer: Self-pay | Admitting: Gynecologic Oncology

## 2012-07-25 LAB — BASIC METABOLIC PANEL
BUN: 11 mg/dL (ref 6–23)
Creatinine, Ser: 1.23 mg/dL — ABNORMAL HIGH (ref 0.50–1.10)
GFR calc Af Amer: 50 mL/min — ABNORMAL LOW (ref >90)
GFR calc non Af Amer: 43 mL/min — ABNORMAL LOW (ref >90)

## 2012-07-25 LAB — CBC
HCT: 34.4 % — ABNORMAL LOW (ref 36.0–46.0)
MCHC: 30.5 g/dL (ref 30.0–36.0)
RDW: 14.2 % (ref 11.5–15.5)

## 2012-07-25 MED ORDER — ENOXAPARIN SODIUM 60 MG/0.6ML ~~LOC~~ SOLN
50.0000 mg | SUBCUTANEOUS | Status: DC
  Administered 2012-07-25 – 2012-07-29 (×5): 50 mg via SUBCUTANEOUS
  Filled 2012-07-25 (×5): qty 0.6

## 2012-07-25 MED ORDER — ENOXAPARIN SODIUM 40 MG/0.4ML ~~LOC~~ SOLN: 40.0000 mg | SUBCUTANEOUS | Status: DC

## 2012-07-25 NOTE — Progress Notes (Signed)
1 Day Post-Op Procedure(s) (LRB): ROBOTIC ASSISTED TOTAL HYSTERECTOMY WITH BILATERAL SALPINGO OOPHORECTOMY (Bilateral) HYSTERECTOMY ABDOMINAL () SALPINGO OOPHORECTOMY (Bilateral)  Subjective: Patient reports mild intermittent nausea.  Tolerating sips of water and ice chips.  Reporting adequate pain relief with the PCA.  Denies vomiting, chest pain, dyspnea, passing flatus, or having a bowel movement.  Objective: Vital signs in last 24 hours: Temp:  [97.4 F (36.3 C)-98.7 F (37.1 C)] 98.7 F (37.1 C) (11/13 0558) Pulse Rate:  [77-95] 93  (11/13 0558) Resp:  [8-22] 20  (11/13 0759) BP: (121-211)/(65-109) 147/69 mmHg (11/13 0558) SpO2:  [96 %-100 %] 100 % (11/13 0759) Weight:  [236 lb (107.049 kg)] 236 lb (107.049 kg) (11/12 1316) Last BM Date: 07/23/12  Intake/Output from previous day: 11/12 0701 - 11/13 0700 In: 5143.3 [P.O.:760; I.V.:4183.3; IV Piggyback:200] Out: 2840 [Urine:2390; Blood:450]  Physical Examination: General: alert, cooperative and no distress Resp: mildly diminished in the bilateral bases Cardio: regular rate and rhythm, S1, S2 normal, no murmur, click, rub or gallop GI: abnormal findings:  hypoactive bowel sounds and obese and incision: with staples midline, dressing removed, incision clean, dry, and intact at this time. Extremities: extremities normal, atraumatic, no cyanosis or edema  Labs: WBC/Hgb/Hct/Plts:  8.4/10.5/34.4/195 (11/13 0431) BUN/Cr/glu/ALT/AST/amyl/lip:  11/1.23/--/--/--/--/-- (11/13 0431)  Assessment: 71 y.o. s/p Procedure(s): ROBOTIC ASSISTED TOTAL HYSTERECTOMY WITH BILATERAL SALPINGO OOPHORECTOMY HYSTERECTOMY ABDOMINAL SALPINGO OOPHORECTOMY: stable Pain:  Pain is well-controlled on PCA.  CV: Hypertension: Stable at this time.  Current treatment:  Benicar HCT.    GI:  Tolerating minimal amount of sips of clears.  GU:  History of renal insufficiency.  Plan to monitor.  Creat 1.23 this am.    Prophylaxis: pharmacologic prophylaxis  (with any of the following: enoxaparin (Lovenox) 40mg  SQ 2 hours prior to surgery then every day) and intermittent pneumatic compression boots.  Plan: Encourage ambulation Continue sips of clear liquids until nausea resolves or +flatus Encourage IS use, deep breathing, and coughing Continue post-operative plan of care   LOS: 1 day    Marissa [name] 07/25/2012, 9:53 AM

## 2012-07-25 NOTE — Progress Notes (Signed)
1 Day Post-Op Procedure(s) (LRB): ROBOTIC ASSISTED TOTAL HYSTERECTOMY WITH BILATERAL SALPINGO OOPHORECTOMY (Bilateral) HYSTERECTOMY ABDOMINAL () SALPINGO OOPHORECTOMY (Bilateral)  Subjective: Patient reports nausea intermittently.  "My stomach feels weird and I am cold."  Denies vomiting, passing flatus, or having a bowel movement.  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  Pt also reporting that her bladder was leaking.   Objective: Vital signs in last 24 hours: Temp:  [97.6 F (36.4 C)-98.7 F (37.1 C)] 97.8 F (36.6 C) (11/13 1401) Pulse Rate:  [89-107] 107  (11/13 1401) Resp:  [8-22] 20  (11/13 1401) BP: (139-175)/(69-90) 169/83 mmHg (11/13 1401) SpO2:  [98 %-100 %] 100 % (11/13 1401) Last BM Date: 07/23/12  Intake/Output from previous day: 11/12 0701 - 11/13 0700 In: 5143.3 [P.O.:760; I.V.:4183.3; IV Piggyback:200] Out: 2840 [Urine:2390; Blood:450]  Physical Examination: General: alert, cooperative and no distress Resp: Mildly diminished in the bases Cardio: regular rate and rhythm, S1, S2 normal, no murmur, click, rub or gallop GI: abnormal findings:  hypoactive bowel sounds and obese and incision: midline incision with staples clean, dry, and intact.  Binder on. Extremities: extremities normal, atraumatic, no cyanosis or edema Vaginal Bleeding: minimal  Labs: WBC/Hgb/Hct/Plts:  8.4/10.5/34.4/195 (11/13 0431) BUN/Cr/glu/ALT/AST/amyl/lip:  11/1.23/--/--/--/--/-- (11/13 0431)  Assessment: 71 y.o. s/p Procedure(s): ROBOTIC ASSISTED TOTAL HYSTERECTOMY WITH BILATERAL SALPINGO OOPHORECTOMY HYSTERECTOMY ABDOMINAL SALPINGO OOPHORECTOMY: stable Pain:  Pain is well-controlled on PCA.  CV: Hypertension: stable at this time.  On Benicar HCT.    GI:  Tolerating po: minimal amount.    Prophylaxis: pharmacologic prophylaxis (with any of the following: enoxaparin (Lovenox) 40mg  SQ 2 hours prior to surgery then every day) and  intermittent pneumatic compression boots.  Plan: Encourage ambulation Use anti-emetics as needed Continue sips of clear liquids until nausea resolves Continue post-op plan of care   LOS: 1 day   Marissa [name] DEAL 07/25/2012, 2:18 PM

## 2012-07-26 ENCOUNTER — Inpatient Hospital Stay (HOSPITAL_COMMUNITY): Payer: Medicare Other

## 2012-07-26 LAB — BASIC METABOLIC PANEL
BUN: 11 mg/dL (ref 6–23)
Calcium: 9 mg/dL (ref 8.4–10.5)
Chloride: 102 mEq/L (ref 96–112)
Creatinine, Ser: 1.46 mg/dL — ABNORMAL HIGH (ref 0.50–1.10)
GFR calc Af Amer: 45 mL/min — ABNORMAL LOW (ref >90)
GFR calc Af Amer: 41 mL/min — ABNORMAL LOW (ref >90)
GFR calc non Af Amer: 38 mL/min — ABNORMAL LOW (ref >90)
Potassium: 3.9 mEq/L (ref 3.5–5.1)
Sodium: 134 mEq/L — ABNORMAL LOW (ref 135–145)

## 2012-07-26 MED ORDER — FUROSEMIDE 10 MG/ML IJ SOLN
40.0000 mg | Freq: Two times a day (BID) | INTRAMUSCULAR | Status: DC
  Administered 2012-07-26: 40 mg via INTRAVENOUS
  Filled 2012-07-26 (×2): qty 4

## 2012-07-26 NOTE — Progress Notes (Signed)
2 Days Post-Op Procedure(s) (LRB): ROBOTIC ASSISTED TOTAL HYSTERECTOMY WITH BILATERAL SALPINGO OOPHORECTOMY (Bilateral) HYSTERECTOMY ABDOMINAL () SALPINGO OOPHORECTOMY (Bilateral)  Subjective: Patient reports "feeling good."  Reporting resolution in nausea.  Had two incontinent episodes of loose stools yesterday.  Reporting adequate pain relief.  Denies nausea, vomiting, dyspnea, shortness of breath, or chest pain.  When questioned about current wheezing, stating "it will go away."  Reporting constant urinary leakage with movement and with drinking any liquid.  Objective: Vital signs in last 24 hours: Temp:  [97.8 F (36.6 C)-99 F (37.2 C)] 99 F (37.2 C) (11/14 0602) Pulse Rate:  [96-107] 105  (11/14 0602) Resp:  [16-20] 18  (11/14 0602) BP: (114-169)/(74-88) 114/74 mmHg (11/14 0602) SpO2:  [100 %] 100 % (11/14 0602) Last BM Date: 07/26/12  Intake/Output from previous day: 11/13 0701 - 11/14 0700 In: 2080 [P.O.:1080; I.V.:1000] Out: 2687 [Urine:2685; Stool:2]  Physical Examination: General: alert, cooperative and no distress Resp: wheezing noted bilaterally with scattered rales in the lower bases Cardio: regular rate and rhythm, S1, S2 normal, no murmur, click, rub or gallop and tachycardic with rate at 100 bpm GI: abnormal findings:  obese and active bowel sounds noted in the upper abdominal quadrants, hypoactive in lower quadrants, abd soft, non-tender and incision: midline incision with staples, area moist due to serous drainage from incision and incontinent episode of urine. Extremities: extremities normal, atraumatic, no cyanosis or edema Vaginal Bleeding: none  Labs:   BUN/Cr/glu/ALT/AST/amyl/lip:  11/1.35/--/--/--/--/-- (11/14 0415)  Assessment: 71 y.o. s/p Procedure(s): ROBOTIC ASSISTED TOTAL HYSTERECTOMY WITH BILATERAL SALPINGO OOPHORECTOMY HYSTERECTOMY ABDOMINAL SALPINGO OOPHORECTOMY: stable Pain:  Pain is well-controlled on PCA.  CV:  Hypertension: stable at  this time.  Benicar HCT ordered.  History of cardiomegaly and CAD.   Resp:  New onset of wheezing with scattered rales this am.  Oxygen saturation at 100%.  No reports of respiratory distress or dyspnea.  Questionable fluid overload.  GI:  Tolerating po: Yes.  Two incontinent episodes of loose bowel movements yesterday.   GU:  History of renal insufficiency, Creat 1.35 this am.  Plan to monitor.  Incontinent of urine.    FEN: Mild hyponatremia this am with Na+ 134.  Plan to monitor.  Prophylaxis: pharmacologic prophylaxis (with any of the following: enoxaparin (Lovenox) 40mg  SQ 2 hours prior to surgery then every day) and intermittent pneumatic compression boots.  Plan: Advance diet Encourage ambulation Lasix 40 mg IV now and repeat in 12 hours Bmet this afternoon and in the am with a Magnesium level EKG now IVF to 50 cc/hr Portable chest xray now Replace foley catheter for strict I&O and output monitoring Daily weights Keep O2 sats about 92% Continue post-operative care Encourage IS use, deep breathing, and coughing   LOS: 2 days    CROSS, MELISSA DEAL 07/26/2012, 9:20 AM

## 2012-07-27 LAB — CBC WITH DIFFERENTIAL/PLATELET
Basophils Absolute: 0 10*3/uL (ref 0.0–0.1)
Lymphocytes Relative: 7 % — ABNORMAL LOW (ref 12–46)
Monocytes Relative: 8 % (ref 3–12)
Neutrophils Relative %: 83 % — ABNORMAL HIGH (ref 43–77)
Platelets: 227 10*3/uL (ref 150–400)
RDW: 14.2 % (ref 11.5–15.5)
WBC: 9.7 10*3/uL (ref 4.0–10.5)

## 2012-07-27 LAB — BASIC METABOLIC PANEL
BUN: 16 mg/dL (ref 6–23)
CO2: 25 mEq/L (ref 19–32)
Calcium: 9.1 mg/dL (ref 8.4–10.5)
Chloride: 100 mEq/L (ref 96–112)
GFR calc Af Amer: 33 mL/min — ABNORMAL LOW (ref >90)
GFR calc Af Amer: 33 mL/min — ABNORMAL LOW (ref >90)
GFR calc non Af Amer: 28 mL/min — ABNORMAL LOW (ref >90)
Glucose, Bld: 111 mg/dL — ABNORMAL HIGH (ref 70–99)
Potassium: 4.4 mEq/L (ref 3.5–5.1)
Sodium: 132 mEq/L — ABNORMAL LOW (ref 135–145)

## 2012-07-27 LAB — HEMOGLOBIN AND HEMATOCRIT, BLOOD: HCT: 28.5 % — ABNORMAL LOW (ref 36.0–46.0)

## 2012-07-27 LAB — MAGNESIUM: Magnesium: 2.1 mg/dL (ref 1.5–2.5)

## 2012-07-27 NOTE — Progress Notes (Signed)
CARE MANAGEMENT NOTE 07/27/2012  Patient:  Marissa [name], Marissa [name]   Account Number:  [id number]  Date Initiated:  07/27/2012  Documentation initiated by:  Deanne Bedgood  Subjective/Objective Assessment:   pt is s/p robotic hysterectomy, npo until [id number] now advancing diet     Action/Plan:   home   Anticipated DC Date:  07/28/2012   Anticipated DC Plan:  HOME/SELF CARE  In-house referral  NA      DC Planning Services  NA      [facility] Choice  NA   Choice offered to / List presented to:  NA   DME arranged  NA      DME agency  NA     HH arranged  NA      HH agency  NA   Status of service:  In process, will continue to follow Medicare Important Message given?  NA - LOS <3 / Initial given by admissions (If response is "NO", the following Medicare IM given date fields will be blank) Date Medicare IM given:   Date Additional Medicare IM given:    Discharge Disposition:    Per UR Regulation:  Reviewed for med. necessity/level of care/duration of stay  If discussed at Long Length of Stay Meetings, dates discussed:    Comments:  [id number]/Adylee Leonardo Earlene Plater, RN, BSN, CCM: CHART REVIEWED AND UPDATED.  Next Review due on [id number]. NO DISCHARGE NEEDS PRESENT AT THIS TIME. CASE MANAGEMENT [phone]

## 2012-07-27 NOTE — Progress Notes (Signed)
3 Days Post-Op Procedure(s) (LRB): ROBOTIC ASSISTED TOTAL HYSTERECTOMY WITH BILATERAL SALPINGO OOPHORECTOMY (Bilateral) HYSTERECTOMY ABDOMINAL () SALPINGO OOPHORECTOMY (Bilateral)  Subjective: Patient reports being "sleepy."  Having loose bowel movements.  Reporting adequate pain relief.  Stating that she would like to try pancakes and bacon this am for breakfast.  Denies nausea, vomiting, chest pain, dyspnea, or passing flatus.    Objective: Vital signs in last 24 hours: Temp:  [97.9 F (36.6 C)-100.1 F (37.8 C)] 100.1 F (37.8 C) (11/15 0543) Pulse Rate:  [90-106] 103  (11/15 0543) Resp:  [14-22] 14  (11/15 0543) BP: (128-142)/(71-79) 128/76 mmHg (11/15 0543) SpO2:  [96 %-100 %] 99 % (11/15 0543) Weight:  [235 lb 8 oz (161.096 kg)] 235 lb 8 oz (106.822 kg) (11/15 0658) Last BM Date: 07/26/12  Intake/Output from previous day: 11/14 0701 - 11/15 0700 In: 840 [P.O.:240; I.V.:600] Out: 3500 [Urine:3500]  Physical Examination: General: alert, cooperative and sleepy Resp: mildly diminished in the bases, no wheezing or rales noted Cardio: S1, S2 normal, no click, no rub and tachycardic with rate 102 bpm GI: soft, non-tender; bowel sounds normal; no masses,  no organomegaly, incision: midline incision with staples with minimal amount of serous drainage present this am and abdomen obese Extremities: extremities normal, atraumatic, no cyanosis or edema Right wrist IV site without redness or drainage  Labs: WBC/Hgb/Hct/Plts:  9.7/8.4/27.2/227 (11/15 0405) BUN/Cr/glu/ALT/AST/amyl/lip:  14/1.74/--/--/--/--/-- (11/15 0405)  Assessment: 71 y.o. s/p Procedure(s): ROBOTIC ASSISTED TOTAL HYSTERECTOMY WITH BILATERAL SALPINGO OOPHORECTOMY HYSTERECTOMY ABDOMINAL SALPINGO OOPHORECTOMY: stable Pain:  Pain is well-controlled on PCA  ID:  Elevated temp this am.  Plan to monitor temp and check CBC with diff in the am.  IV site without erythema or drainage.  Remove foley this am.    CV:   Hypertension: stable this am.  Current treatment: Benicar HCT.  History of cardiomegaly and CAD.  EKG from 07/26/12:  Normal sinus rhythm, nonspecific T wave abnormality.  Monitor tachycardia.   Resp:  Resolution of wheezing/rales yesterday after lasix administration.  Chest xray from 07/26/12:  Central mild vascular congestion. No convincing pulmonary edema.  Borderline cardiomegaly. No focal infiltrate.   GI:  Tolerating po: Yes.  Having episodes of incontinence with loose stools.  GU:  History of renal insufficiency.  Creat 1.74 this am.  Repeat Bmet this afternoon.  FEN: Mild hyponatremia, Na+ 132.  Repeat Bmet this afternoon.  Prophylaxis: pharmacologic prophylaxis (with any of the following: Lovenox 50 mg SQ daily) and intermittent pneumatic compression boots.  Plan: Advance diet to high fiber diet to add bulk to stools Bmet this afternoon Discontinue foley CBC with diff in the am Discontinue PCA Continue post-operative care Encourage IS use, deep breathing, and coughing Increase ambulation  Possible discharge tomorrow   LOS: 3 days    Iveth Heidemann DEAL 07/27/2012, 8:40 AM

## 2012-07-28 LAB — CBC WITH DIFFERENTIAL/PLATELET
Eosinophils Relative: 2 % (ref 0–5)
HCT: 27.8 % — ABNORMAL LOW (ref 36.0–46.0)
Lymphocytes Relative: 9 % — ABNORMAL LOW (ref 12–46)
Lymphs Abs: 1.1 10*3/uL (ref 0.7–4.0)
MCV: 67 fL — ABNORMAL LOW (ref 78.0–100.0)
Monocytes Relative: 11 % (ref 3–12)
Platelets: 197 10*3/uL (ref 150–400)
RBC: 4.15 MIL/uL (ref 3.87–5.11)
WBC: 12.6 10*3/uL — ABNORMAL HIGH (ref 4.0–10.5)

## 2012-07-28 LAB — BASIC METABOLIC PANEL
CO2: 25 mEq/L (ref 19–32)
Chloride: 100 mEq/L (ref 96–112)
Creatinine, Ser: 1.59 mg/dL — ABNORMAL HIGH (ref 0.50–1.10)
GFR calc Af Amer: 37 mL/min — ABNORMAL LOW (ref >90)
Potassium: 4.5 mEq/L (ref 3.5–5.1)

## 2012-07-28 NOTE — Progress Notes (Signed)
Patient ID: Marissa [name], female   DOB: 12-02-1940, 71 y.o.   MRN: [id number] 4 Days Post-Op Procedure(s) (LRB): ROBOTIC ASSISTED TOTAL HYSTERECTOMY WITH BILATERAL SALPINGO OOPHORECTOMY (Bilateral) HYSTERECTOMY ABDOMINAL () SALPINGO OOPHORECTOMY (Bilateral)  Subjective: Patient reports decreased appetite + flatus and no problems voiding.    Objective: Vital signs in last 24 hours: Temp:  [98.9 F (37.2 C)-100.2 F (37.9 C)] 100.2 F (37.9 C) (11/16 0507) Pulse Rate:  [95-97] 95  (11/16 0507) Resp:  [18] 18  (11/16 0507) BP: (124-136)/(74-83) 136/80 mmHg (11/16 0507) SpO2:  [96 %-99 %] 97 % (11/16 0507) Last BM Date: 07/27/12  Intake/Output from previous day: 11/15 0701 - 11/16 0700 In: 1440 [P.O.:240; I.V.:1200] Out: 700 [Urine:700]  Physical Examination: General: alert and no distress GI: normal findings: soft, non-tender and hypoactive BS and incision: clean, dry and intact Extremities: extremities normal, atraumatic, no cyanosis or edema Vaginal Bleeding: none  Labs: WBC/Hgb/Hct/Plts:  12.6/8.7/27.8/197 (11/16 5621) BUN/Cr/glu/ALT/AST/amyl/lip:  16/1.59/--/--/--/--/-- (11/16 3086)   Assessment:  71 y.o. s/p Procedure(s): ROBOTIC ASSISTED TOTAL HYSTERECTOMY WITH BILATERAL SALPINGO OOPHORECTOMY HYSTERECTOMY ABDOMINAL SALPINGO OOPHORECTOMY: stable, progressing well, tolerating diet and has decreased appetite. Pain:  Pain is well-controlled on PCA or oral medications.  Heme:Anemia: Clinically stable.  Iron will be Rx on discharge.  ID: none  CV: Stable.  GI:  Tolerating po: Yes   Pt with slow return to active bowel activity.      Prophylaxis: pharmacologic prophylaxis (with any of the following: enoxaparin (Lovenox) 40mg  SQ 2 hours prior to surgery then every day).  Plan: Advance diet Encourage ambulation .   LOS: 4 days    HARPER,CHARLES A 07/28/2012, 12:32 PM

## 2012-07-29 MED ORDER — OXYCODONE-ACETAMINOPHEN 5-325 MG PO TABS: 1.0000 | ORAL_TABLET | ORAL | Status: DC | PRN

## 2012-07-29 NOTE — Discharge Summary (Signed)
Physician Discharge Summary  Patient ID: Marissa [name] MRN: [id number] DOB/AGE: [birth year] 71 y.o.  Admit date: 07/24/2012 Discharge date: 07/29/2012  Admission Diagnoses:  Leiomyoma  Discharge Diagnoses:  Same Principal Problem:  *Vaginal bleeding   Discharged Condition: good  Hospital Course: Underwent TAH/BSO for leiomyoma.  No intraoperative complications.  Postoperative course uncomplicated.  Discharged home in good condition.  Consults: None  Significant Diagnostic Studies: labs: CBC, CMET  Treatments: IV hydration, analgesia: Percocet and anticoagulation: LMW heparin  Discharge Exam: Blood pressure 148/82, pulse 114, temperature 99.5 F (37.5 C), temperature source Oral, resp. rate 18, height 5\' 3"  (1.6 m), weight 106.822 kg (235 lb 8 oz), SpO2 99.00%. General appearance: alert and no distress Resp: clear to auscultation bilaterally Cardio: regular rate and rhythm, S1, S2 normal, no murmur, click, rub or gallop GI: normal findings: bowel sounds normal and soft, non-tender Extremities: extremities normal, atraumatic, no cyanosis or edema Incision/Wound:  Clean, dry and intact.  Disposition: 01-Home or Self Care  Discharge Orders    Future Orders Please Complete By Expires   Diet - low sodium heart healthy      Increase activity slowly      Discharge instructions      Comments:   Routine   Driving Restrictions      Comments:   No driving for 2 weeks.   Lifting restrictions      Comments:   No lifting > 20 lbs.   Sexual Activity Restrictions      Comments:   No sex for 6 weeks.   Other Restrictions      Comments:   Routine   Discharge wound care:      Comments:   Keep incision clean and dry.   Call MD for:      Call MD for:  temperature >100.4      Call MD for:  persistant nausea and vomiting      Call MD for:  severe uncontrolled pain      Call MD for:  redness, tenderness, or signs of infection (pain, swelling, redness, odor or green/yellow  discharge around incision site)      Call MD for:  difficulty breathing, headache or visual disturbances      Call MD for:  hives      Call MD for:  persistant dizziness or light-headedness      Call MD for:  extreme fatigue      (HEART FAILURE PATIENTS) Call MD:  Anytime you have any of the following symptoms: 1) 3 pound weight gain in 24 hours or 5 pounds in 1 week 2) shortness of breath, with or without a dry hacking cough 3) swelling in the hands, feet or stomach 4) if you have to sleep on extra pillows at night in order to breathe.      Discharge patient          Medication List     As of 07/29/2012  9:31 AM    TAKE these medications         colchicine 0.6 MG tablet   Take 1 tablet (0.6 mg total) by mouth daily.      folic acid 1 MG tablet   Commonly known as: FOLVITE   Take 1 tablet (1 mg total) by mouth daily.      naproxen 500 MG tablet   Commonly known as: NAPROSYN   Take 500 mg by mouth 2 (two) times daily as needed. For gout pain  olmesartan-hydrochlorothiazide 20-12.5 MG per tablet   Commonly known as: BENICAR HCT   Take 1 tablet by mouth every morning.      omeprazole 20 MG capsule   Commonly known as: PRILOSEC   Take 20 mg by mouth every morning.      oxyCODONE-acetaminophen 5-325 MG per tablet   Commonly known as: PERCOCET/ROXICET   Take 1-2 tablets by mouth every 4 (four) hours as needed for pain (moderate to severe pain (when tolerating fluids)).      pravastatin 40 MG tablet   Commonly known as: PRAVACHOL   Take 40 mg by mouth every morning.           Follow-up Information    Follow up with Antionette Char A, MD. Schedule an appointment as soon as possible for a visit in 1 week. (Removal of staples.)    Contact information:   [address] [place] [address] [phone]          Signed: Brock Bad 07/29/2012, 9:31 AM

## 2012-07-29 NOTE — Progress Notes (Signed)
Patient ID: Marissa [name], female   DOB: Feb 25, 1941, 71 y.o.   MRN: [id number] 5 Days Post-Op Procedure(s) (LRB): ROBOTIC ASSISTED TOTAL HYSTERECTOMY WITH BILATERAL SALPINGO OOPHORECTOMY (Bilateral) HYSTERECTOMY ABDOMINAL () SALPINGO OOPHORECTOMY (Bilateral)  Subjective: Patient reports no complaints. tolerating PO, + flatus, + BM and no problems voiding.    Objective: Vital signs in last 24 hours: Temp:  [97.9 F (36.6 C)-100 F (37.8 C)] 99.5 F (37.5 C) (11/17 0500) Pulse Rate:  [89-114] 114  (11/17 0500) Resp:  [16-18] 18  (11/17 0500) BP: (124-148)/(77-82) 148/82 mmHg (11/17 0500) SpO2:  [97 %-99 %] 99 % (11/17 0500) Last BM Date: 07/29/12  Intake/Output from previous day: 11/16 0701 - 11/17 0700 In: 1700 [I.V.:1700] Out: 600 [Urine:600]  Physical Examination: General: alert and no distress GI: normal findings: bowel sounds normal and soft, non-tender and incision: clean, dry and intact Extremities: extremities normal, atraumatic, no cyanosis or edema Vaginal Bleeding: none  Labs:       Assessment:  71 y.o. s/p Procedure(s): ROBOTIC ASSISTED TOTAL HYSTERECTOMY WITH BILATERAL SALPINGO OOPHORECTOMY HYSTERECTOMY ABDOMINAL SALPINGO OOPHORECTOMY: stable, progressing well and tolerating diet Pain:  Pain is well-controlled on PCA or oral meds.    Prophylaxis: pharmacologic prophylaxis (with any of the following: enoxaparin (Lovenox) 40mg  SQ 2 hours prior to surgery then every day).  Plan: Discharge home Dispo:  Discharge plan to include :consults: @CM @, Social Work The patient is to be discharged to home.   LOS: 5 days    HARPER,CHARLES A 07/29/2012, 9:05 AM

## 2012-07-30 NOTE — Care Management Note (Signed)
    Page 1 of 2   07/30/2012     8:41:40 AM   CARE MANAGEMENT NOTE 07/30/2012  Patient:  Marissa [name], Marissa [name]   Account Number:  [id number]  Date Initiated:  07/27/2012  Documentation initiated by:  DAVIS,RHONDA  Subjective/Objective Assessment:   pt is s/p robotic hysterectomy, npo until [id number] now advancing diet     Action/Plan:   home   Anticipated DC Date:  07/28/2012   Anticipated DC Plan:  HOME/SELF CARE  In-house referral  NA      DC Planning Services  NA      PAC Choice  NA   Choice offered to / List presented to:  NA   DME arranged  NA      DME agency  NA     HH arranged  NA      HH agency  NA   Status of service:  Completed, signed off Medicare Important Message given?  NA - LOS <3 / Initial given by admissions (If response is "NO", the following Medicare IM given date fields will be blank) Date Medicare IM given:   Date Additional Medicare IM given:    Discharge Disposition:  HOME/SELF CARE  Per UR Regulation:  Reviewed for med. necessity/level of care/duration of stay  If discussed at Long Length of Stay Meetings, dates discussed:    Comments:  [id number]/Rhonda Earlene Plater, RN, BSN, CCM: CHART REVIEWED AND UPDATED.  Next Review due on [id number]. NO DISCHARGE NEEDS PRESENT AT THIS TIME. CASE MANAGEMENT [phone]

## 2012-08-01 ENCOUNTER — Encounter (HOSPITAL_COMMUNITY)
Admission: AD | Disposition: A | Discharge status: Other Acute Inpatient Hospital | Payer: Self-pay | Source: Ambulatory Visit | Attending: Obstetrics & Gynecology

## 2012-08-01 ENCOUNTER — Inpatient Hospital Stay (HOSPITAL_COMMUNITY): Payer: Medicare Other | Admitting: Anesthesiology

## 2012-08-01 ENCOUNTER — Encounter (HOSPITAL_COMMUNITY): Payer: Self-pay | Admitting: Anesthesiology

## 2012-08-01 ENCOUNTER — Ambulatory Visit: Admit: 2012-08-01 | Payer: Self-pay | Admitting: Obstetrics & Gynecology

## 2012-08-01 ENCOUNTER — Encounter (HOSPITAL_COMMUNITY): Payer: Self-pay | Admitting: *Deleted

## 2012-08-01 ENCOUNTER — Encounter (HOSPITAL_COMMUNITY): Payer: Self-pay

## 2012-08-01 ENCOUNTER — Inpatient Hospital Stay (HOSPITAL_COMMUNITY)
Admission: AD | Admit: 2012-08-01 | Discharge: 2012-08-01 | Disposition: A | Discharge status: Other Acute Inpatient Hospital | Payer: Medicare Other | Source: Ambulatory Visit | Attending: Obstetrics & Gynecology | Admitting: Obstetrics & Gynecology

## 2012-08-01 ENCOUNTER — Inpatient Hospital Stay (HOSPITAL_COMMUNITY)
Admission: AD | Admit: 2012-08-01 | Discharge: 2012-08-15 | DRG: 907 | Disposition: A | Discharge status: Home Health | Payer: Medicare Other | Source: Other Acute Inpatient Hospital | Attending: Obstetrics & Gynecology | Admitting: Obstetrics & Gynecology

## 2012-08-01 DIAGNOSIS — Z86718 Personal history of other venous thrombosis and embolism: Secondary | ICD-10-CM

## 2012-08-01 DIAGNOSIS — I10 Essential (primary) hypertension: Secondary | ICD-10-CM

## 2012-08-01 DIAGNOSIS — T8131XA Disruption of external operation (surgical) wound, not elsewhere classified, initial encounter: Principal | ICD-10-CM

## 2012-08-01 DIAGNOSIS — D529 Folate deficiency anemia, unspecified: Secondary | ICD-10-CM

## 2012-08-01 DIAGNOSIS — K632 Fistula of intestine: Secondary | ICD-10-CM | POA: Diagnosis present

## 2012-08-01 DIAGNOSIS — N179 Acute kidney failure, unspecified: Secondary | ICD-10-CM | POA: Diagnosis present

## 2012-08-01 DIAGNOSIS — Z79899 Other long term (current) drug therapy: Secondary | ICD-10-CM

## 2012-08-01 DIAGNOSIS — N390 Urinary tract infection, site not specified: Secondary | ICD-10-CM

## 2012-08-01 DIAGNOSIS — E785 Hyperlipidemia, unspecified: Secondary | ICD-10-CM | POA: Diagnosis present

## 2012-08-01 DIAGNOSIS — Z860100 Personal history of colon polyps, unspecified: Secondary | ICD-10-CM

## 2012-08-01 DIAGNOSIS — G4733 Obstructive sleep apnea (adult) (pediatric): Secondary | ICD-10-CM | POA: Diagnosis present

## 2012-08-01 DIAGNOSIS — J9600 Acute respiratory failure, unspecified whether with hypoxia or hypercapnia: Secondary | ICD-10-CM

## 2012-08-01 DIAGNOSIS — E43 Unspecified severe protein-calorie malnutrition: Secondary | ICD-10-CM | POA: Diagnosis present

## 2012-08-01 DIAGNOSIS — Z8719 Personal history of other diseases of the digestive system: Secondary | ICD-10-CM

## 2012-08-01 DIAGNOSIS — Z90710 Acquired absence of both cervix and uterus: Secondary | ICD-10-CM

## 2012-08-01 DIAGNOSIS — I517 Cardiomegaly: Secondary | ICD-10-CM | POA: Diagnosis present

## 2012-08-01 DIAGNOSIS — I2510 Atherosclerotic heart disease of native coronary artery without angina pectoris: Secondary | ICD-10-CM | POA: Diagnosis present

## 2012-08-01 DIAGNOSIS — K559 Vascular disorder of intestine, unspecified: Secondary | ICD-10-CM | POA: Diagnosis present

## 2012-08-01 DIAGNOSIS — J95821 Acute postprocedural respiratory failure: Secondary | ICD-10-CM | POA: Diagnosis not present

## 2012-08-01 DIAGNOSIS — Y836 Removal of other organ (partial) (total) as the cause of abnormal reaction of the patient, or of later complication, without mention of misadventure at the time of the procedure: Secondary | ICD-10-CM | POA: Diagnosis present

## 2012-08-01 DIAGNOSIS — Z9049 Acquired absence of other specified parts of digestive tract: Secondary | ICD-10-CM

## 2012-08-01 DIAGNOSIS — I129 Hypertensive chronic kidney disease with stage 1 through stage 4 chronic kidney disease, or unspecified chronic kidney disease: Secondary | ICD-10-CM | POA: Diagnosis present

## 2012-08-01 DIAGNOSIS — Z6837 Body mass index (BMI) 37.0-37.9, adult: Secondary | ICD-10-CM

## 2012-08-01 DIAGNOSIS — N183 Chronic kidney disease, stage 3 unspecified: Secondary | ICD-10-CM | POA: Diagnosis present

## 2012-08-01 DIAGNOSIS — M109 Gout, unspecified: Secondary | ICD-10-CM | POA: Diagnosis present

## 2012-08-01 DIAGNOSIS — D509 Iron deficiency anemia, unspecified: Secondary | ICD-10-CM | POA: Diagnosis present

## 2012-08-01 HISTORY — PX: ABDOMINAL WOUND DEHISCENCE: SHX540

## 2012-08-01 LAB — COMPREHENSIVE METABOLIC PANEL
Alkaline Phosphatase: 106 U/L (ref 39–117)
BUN: 47 mg/dL — ABNORMAL HIGH (ref 6–23)
CO2: 23 mEq/L (ref 19–32)
Chloride: 96 mEq/L (ref 96–112)
Creatinine, Ser: 3.4 mg/dL — ABNORMAL HIGH (ref 0.50–1.10)
GFR calc non Af Amer: 13 mL/min — ABNORMAL LOW (ref >90)
Glucose, Bld: 110 mg/dL — ABNORMAL HIGH (ref 70–99)
Potassium: 4 mEq/L (ref 3.5–5.1)
Total Bilirubin: 0.5 mg/dL (ref 0.3–1.2)

## 2012-08-01 LAB — APTT: aPTT: 25 seconds (ref 24–37)

## 2012-08-01 LAB — CBC
HCT: 29.2 % — ABNORMAL LOW (ref 36.0–46.0)
Hemoglobin: 9 g/dL — ABNORMAL LOW (ref 12.0–15.0)
MCV: 68.1 fL — ABNORMAL LOW (ref 78.0–100.0)
RBC: 4.29 MIL/uL (ref 3.87–5.11)
WBC: 11.4 10*3/uL — ABNORMAL HIGH (ref 4.0–10.5)

## 2012-08-01 LAB — TYPE AND SCREEN
ABO/RH(D): B POS
Antibody Screen: NEGATIVE

## 2012-08-01 LAB — PROTIME-INR
INR: 1.13 (ref 0.00–1.49)
Prothrombin Time: 14.3 seconds (ref 11.6–15.2)

## 2012-08-01 SURGERY — REPAIR, DEHISCENCE, WOUND, ABDOMEN
Anesthesia: Spinal | Wound class: Dirty or Infected

## 2012-08-01 MED ORDER — HYDROMORPHONE HCL PF 1 MG/ML IJ SOLN
0.2000 mg | INTRAMUSCULAR | Status: DC | PRN
  Administered 2012-08-01: 0.2 mg via INTRAVENOUS
  Filled 2012-08-01: qty 1

## 2012-08-01 MED ORDER — FAMOTIDINE IN NACL 20-0.9 MG/50ML-% IV SOLN
20.0000 mg | Freq: Once | INTRAVENOUS | Status: AC
  Administered 2012-08-01: 20 mg via INTRAVENOUS
  Filled 2012-08-01: qty 50

## 2012-08-01 MED ORDER — GENTAMICIN SULFATE 40 MG/ML IJ SOLN
Freq: Once | INTRAVENOUS | Status: AC
  Administered 2012-08-01: 19:00:00 via INTRAVENOUS
  Filled 2012-08-01: qty 9.25

## 2012-08-01 MED ORDER — PIPERACILLIN-TAZOBACTAM IN DEX 2-0.25 GM/50ML IV SOLN
2.2500 g | Freq: Three times a day (TID) | INTRAVENOUS | Status: DC
  Administered 2012-08-01 – 2012-08-02 (×3): 2.25 g via INTRAVENOUS
  Filled 2012-08-01 (×5): qty 50

## 2012-08-01 MED ORDER — LACTATED RINGERS IV SOLN
INTRAVENOUS | Status: AC
  Administered 2012-08-02 – 2012-08-03 (×3): via INTRAVENOUS

## 2012-08-01 MED ORDER — CLINDAMYCIN PHOSPHATE 900 MG/50ML IV SOLN: 900.0000 mg | Freq: Once | INTRAVENOUS | Status: DC

## 2012-08-01 MED ORDER — LACTATED RINGERS IV SOLN
INTRAVENOUS | Status: DC
  Administered 2012-08-01: 19:00:00 via INTRAVENOUS

## 2012-08-01 NOTE — Progress Notes (Signed)
Dr. Tamela Oddi notified of patient arrival. She states to prep patient for OR. Orders received for labs, LR@100ml /hr and antibiotics.

## 2012-08-01 NOTE — Anesthesia Preprocedure Evaluation (Addendum)
Anesthesia Evaluation  Patient identified by MRN, date of birth, ID band Patient awake    Reviewed: Allergy & Precautions, H&P , NPO status , Patient's Chart, lab work & pertinent test results, reviewed documented beta blocker date and time   History of Anesthesia Complications (+) DIFFICULT AIRWAY  Airway Mallampati: III TM Distance: >3 FB Neck ROM: limited    Dental  (+) Missing   Pulmonary sleep apnea (inconsistent CPAP use) and Continuous Positive Airway Pressure Ventilation ,  breath sounds clear to auscultation  Pulmonary exam normal       Cardiovascular hypertension, On Medications + CAD (denies chest pain) and + Cardiac Stents Rhythm:regular Rate:Normal     Neuro/Psych  Headaches (daily), Somewhat confused - states she feels like she is in a make-believe world - says pain medicine makes her feel this way and that she took a percocet today negative psych ROS   GI/Hepatic negative GI ROS, Neg liver ROS, GERD-  Medicated,H/o esophagitis, diverticulitis   Endo/Other  Morbid obesity  Renal/GU ARFRenal disease (BUN/Cr is 47/3.4, up from 16/1.59 on 11/16)   S/p TAH on 07/24/12 - now with wound dehiscence    Musculoskeletal  (+) Arthritis - (gout),   Abdominal   Peds  Hematology  (+) Blood dyscrasia (hgb 9.0), anemia , H/o DVT   Anesthesia Other Findings NPO since yesterday  Reproductive/Obstetrics negative OB ROS                           Anesthesia Physical Anesthesia Plan  ASA: IV and emergent  Anesthesia Plan: General   Post-op Pain Management:    Induction: Rapid sequence, Cricoid pressure planned and Intravenous  Airway Management Planned: Video Laryngoscope Planned  Additional Equipment:   Intra-op Plan:   Post-operative Plan:   Informed Consent: I have reviewed the patients History and Physical, chart, labs and discussed the procedure including the risks, benefits and  alternatives for the proposed anesthesia with the patient or authorized representative who has indicated his/her understanding and acceptance.   Dental Advisory Given  Plan Discussed with: CRNA and Surgeon  Anesthesia Plan Comments:       Anesthesia Quick Evaluation

## 2012-08-01 NOTE — H&P (Signed)
Medical consult called by Dr Ilene Qua -Christell Constant ( ob gyn) for  71 y/o female with hx of leiomyoma of uterus s/p hysterectomy 10 days back. patient returned to [facility] hospital for surgical wound dehiscence. She was discharged on Lovenox at a prophylactic dose. She had a normal renal function in august which was elevated to 1.7 on recent discharge.  Patient's creatinine today was 3.4 .  She was planned to be taken to OR today for surgical debridement of the wound but held as renal function worsened.  Patient being transferred from [facility] hospital to [facility] with plan on surgical debridement in 1-2 days once renal function is stable. [facility] hospitalist will consult after patient arrives to [facility].

## 2012-08-01 NOTE — OR Nursing (Signed)
Patient transferred back to MAU after surgery was cancelled per Dr. Tamela Oddi. Patient will be then transferred to [facility] per Dr. Tamela Oddi

## 2012-08-01 NOTE — Progress Notes (Signed)
Patient ID: Marissa [name], female   DOB: Nov 16, 1940, 71 y.o.   MRN: [id number] In the setting of ARF/recent Lovenox/comorbidities, transfer to [facility].  Consult with Hospitalist group.  To OR tomorrow for debridement; will coordinate with General Surgery.

## 2012-08-01 NOTE — H&P (Signed)
Subjective: Patient is a 71 y.o. female presents with operative wound infection. Patient currently reports the following symptoms: wound drainage, increasing pain.  Onset of symptoms was gradual starting 1 day ago with unchanged course since that time. Patient's original surgery was on 11/12--s/TAH/BSO/LOA.  Patient Active Problem List   Diagnosis Date Noted  . Leiomyoma of uterus, unspecified 05/04/2012  . Acute gouty arthritis 02/15/2012  . Other screening mammogram 02/15/2012  . Osteopenia 02/15/2012  . 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 Vaginal bleeding 10/06/2011  . Hyperlipidemia 10/06/2011  . Type II or unspecified type diabetes mellitus without mention of complication, uncontrolled 10/06/2011  . SLEEP APNEA, OBSTRUCTIVE 04/02/2009  . GERD 08/20/2006  . GOUT 08/15/2006  . HYPERTENSION 08/15/2006  . CAD 08/15/2006  . RENAL INSUFFICIENCY 08/15/2006  . DVT, HX OF 08/15/2006   Past Medical History  Diagnosis Date  . Hypertension   . Renal insufficiency     baseline Cr ~ 1.3  . Hyperlipidemia   . History of DVT (deep vein thrombosis)   . History of colonic polyps   . Gout   . Cardiomegaly   . CAD (coronary artery disease)     s/p stent placement 04/2005 and s/p PTCA in 04/2008 by Dr. Jonelle Sidle.  . Menorrhagia   . Polyarthritis   . Microcytic anemia   . OSA (obstructive sleep apnea)   . GERD (gastroesophageal reflux disease)   . Esophagitis   . Hidradenitis suppurativa     s/p axillary sweat gland removal  . H/O blood transfusion reaction     at [facility] hospital  . Shortness of breath     OCCASIONAL  . Headache   . Diverticulitis   . Difficulty sleeping   . PMB (postmenopausal bleeding)     X 2 YRS  . Bowel trouble     OCCASIONAL BOWEL INCONTINENCE    Past Surgical History  Procedure Date  . Cervical fusion c-4-5 other   . Cervical laminectomy other   . Tonsillectomy other   . Umbilical hernia repair   . Axillary seat gland removal other   . Right knee  arthroscopy other   . Colectomy 2004    left partial for perforation  . Cholecystectomy   . Tubal ligation   . Ptca and stenting     of mid and distal circumflex coronary artery. 1st stent was 04/2006, with drug eluting stent placed on 12/20/07 (30-50% RCA stenosis). Cardiologist Dr. Jacinto Halim.  . Dilation and curettage of uterus 02/2011  . Hysteroscopy w/d&c 05/04/2012    Procedure: DILATATION AND CURETTAGE /HYSTEROSCOPY;  Surgeon: Antionette Char, MD;  Location: [facility];  Service: Gynecology;  Laterality: N/A;  . Robotic assisted total hysterectomy with bilateral salpingo oopherectomy 07/24/2012    Procedure: ROBOTIC ASSISTED TOTAL HYSTERECTOMY WITH BILATERAL SALPINGO OOPHORECTOMY;  Surgeon: Laurette Schimke, MD PHD;  Location: [facility];  Service: Gynecology;  Laterality: Bilateral;  attempted ,  converted to abdomnial hysterectomy  . Abdominal hysterectomy 07/24/2012    Procedure: HYSTERECTOMY ABDOMINAL;  Surgeon: Laurette Schimke, MD PHD;  Location: [facility];  Service: Gynecology;;  . Salpingoophorectomy 07/24/2012    Procedure: SALPINGO OOPHORECTOMY;  Surgeon: Laurette Schimke, MD PHD;  Location: [facility];  Service: Gynecology;  Laterality: Bilateral;    Prescriptions prior to admission  Medication Sig Dispense Refill  . colchicine 0.6 MG tablet Take 1 tablet (0.6 mg total) by mouth daily.  180 tablet  3  . folic acid (FOLVITE) 1 MG tablet Take 1 tablet (1 mg total) by mouth daily.  30 tablet  11  . naproxen (NAPROSYN) 500 MG tablet Take 500 mg by mouth 2 (two) times daily as needed. For gout pain      . olmesartan-hydrochlorothiazide (BENICAR HCT) 20-12.5 MG per tablet Take 1 tablet by mouth every morning.      Marland Kitchen omeprazole (PRILOSEC) 20 MG capsule Take 20 mg by mouth every morning.      Marland Kitchen oxyCODONE-acetaminophen (PERCOCET/ROXICET) 5-325 MG per tablet Take 1-2 tablets by mouth every 4 (four) hours as needed for pain (moderate to severe pain (when tolerating fluids)).  40 tablet  0  . pravastatin  (PRAVACHOL) 40 MG tablet Take 40 mg by mouth every morning.        Allergies  Allergen Reactions  . Amlodipine     edema    History  Substance Use Topics  . Smoking status: Never Smoker   . Smokeless tobacco: Never Used  . Alcohol Use: No    Family History  Problem Relation Age of Onset  . Diabetes Mother   . Coronary artery disease Mother   . Diabetes Sister   . Coronary artery disease Sister     Review of Systems Pertinent items are noted in HPI.  Objective:    Abdomen: abnormal findings:  Fascial dehiscence, malodorous drainage    Assessment/Plan: Wound infection, fascial dehiscence Operative debridement.  Start broad spectrum antibiotics

## 2012-08-02 ENCOUNTER — Inpatient Hospital Stay (HOSPITAL_COMMUNITY): Payer: Medicare Other

## 2012-08-02 ENCOUNTER — Encounter (HOSPITAL_COMMUNITY): Payer: Self-pay | Admitting: Anesthesiology

## 2012-08-02 ENCOUNTER — Inpatient Hospital Stay (HOSPITAL_COMMUNITY): Payer: Medicare Other | Admitting: Anesthesiology

## 2012-08-02 ENCOUNTER — Encounter (HOSPITAL_COMMUNITY)
Admission: AD | Disposition: A | Discharge status: Home Health | Payer: Self-pay | Source: Other Acute Inpatient Hospital | Attending: Obstetrics & Gynecology

## 2012-08-02 ENCOUNTER — Encounter (HOSPITAL_COMMUNITY): Payer: Self-pay | Admitting: Internal Medicine

## 2012-08-02 DIAGNOSIS — I10 Essential (primary) hypertension: Secondary | ICD-10-CM

## 2012-08-02 DIAGNOSIS — N390 Urinary tract infection, site not specified: Secondary | ICD-10-CM

## 2012-08-02 DIAGNOSIS — IMO0001 Reserved for inherently not codable concepts without codable children: Secondary | ICD-10-CM

## 2012-08-02 DIAGNOSIS — N179 Acute kidney failure, unspecified: Secondary | ICD-10-CM

## 2012-08-02 DIAGNOSIS — T8131XA Disruption of external operation (surgical) wound, not elsewhere classified, initial encounter: Secondary | ICD-10-CM

## 2012-08-02 DIAGNOSIS — J9600 Acute respiratory failure, unspecified whether with hypoxia or hypercapnia: Secondary | ICD-10-CM

## 2012-08-02 DIAGNOSIS — K632 Fistula of intestine: Secondary | ICD-10-CM

## 2012-08-02 HISTORY — PX: LAPAROTOMY: SHX154

## 2012-08-02 LAB — BASIC METABOLIC PANEL
BUN: 44 mg/dL — ABNORMAL HIGH (ref 6–23)
Chloride: 100 mEq/L (ref 96–112)
GFR calc non Af Amer: 16 mL/min — ABNORMAL LOW (ref >90)
Glucose, Bld: 95 mg/dL (ref 70–99)
Potassium: 4 mEq/L (ref 3.5–5.1)
Sodium: 136 mEq/L (ref 135–145)

## 2012-08-02 LAB — SURGICAL PCR SCREEN
MRSA, PCR: NEGATIVE
Staphylococcus aureus: NEGATIVE

## 2012-08-02 LAB — URINALYSIS, ROUTINE W REFLEX MICROSCOPIC
Bilirubin Urine: NEGATIVE
Nitrite: POSITIVE — AB
Protein, ur: 30 mg/dL — AB
Urobilinogen, UA: 0.2 mg/dL (ref 0.0–1.0)

## 2012-08-02 LAB — CBC
HCT: 27.1 % — ABNORMAL LOW (ref 36.0–46.0)
Hemoglobin: 8.3 g/dL — ABNORMAL LOW (ref 12.0–15.0)
MCHC: 30.6 g/dL (ref 30.0–36.0)
RBC: 4.08 MIL/uL (ref 3.87–5.11)
WBC: 8.3 10*3/uL (ref 4.0–10.5)

## 2012-08-02 LAB — PROTEIN / CREATININE RATIO, URINE
Creatinine, Urine: 128.6 mg/dL
Protein Creatinine Ratio: 0.48 — ABNORMAL HIGH (ref 0.00–0.15)
Total Protein, Urine: 62.1 mg/dL

## 2012-08-02 LAB — URINE MICROSCOPIC-ADD ON

## 2012-08-02 SURGERY — LAPAROTOMY, EXPLORATORY
Anesthesia: General

## 2012-08-02 MED ORDER — GLYCOPYRROLATE 0.2 MG/ML IJ SOLN
INTRAMUSCULAR | Status: DC | PRN
  Administered 2012-08-02: 0.6 mg via INTRAVENOUS

## 2012-08-02 MED ORDER — LACTATED RINGERS IV SOLN: INTRAVENOUS | Status: DC

## 2012-08-02 MED ORDER — LACTATED RINGERS IV SOLN
INTRAVENOUS | Status: DC | PRN
  Administered 2012-08-02: 13:00:00 via INTRAVENOUS

## 2012-08-02 MED ORDER — ROCURONIUM BROMIDE 100 MG/10ML IV SOLN
INTRAVENOUS | Status: DC | PRN
  Administered 2012-08-02: 30 mg via INTRAVENOUS

## 2012-08-02 MED ORDER — CLONIDINE HCL 0.1 MG PO TABS
0.1000 mg | ORAL_TABLET | Freq: Four times a day (QID) | ORAL | Status: DC | PRN
  Administered 2012-08-02 – 2012-08-06 (×4): 0.1 mg via ORAL
  Filled 2012-08-02 (×5): qty 1

## 2012-08-02 MED ORDER — PIPERACILLIN-TAZOBACTAM 3.375 G IVPB
3.3750 g | Freq: Three times a day (TID) | INTRAVENOUS | Status: DC
  Administered 2012-08-02 – 2012-08-11 (×26): 3.375 g via INTRAVENOUS
  Filled 2012-08-02 (×31): qty 50

## 2012-08-02 MED ORDER — ONDANSETRON HCL 4 MG PO TABS
4.0000 mg | ORAL_TABLET | Freq: Four times a day (QID) | ORAL | Status: DC | PRN
  Filled 2012-08-02: qty 1

## 2012-08-02 MED ORDER — ENOXAPARIN SODIUM 30 MG/0.3ML ~~LOC~~ SOLN
30.0000 mg | SUBCUTANEOUS | Status: DC
  Administered 2012-08-03 – 2012-08-06 (×4): 30 mg via SUBCUTANEOUS
  Filled 2012-08-02 (×6): qty 0.3

## 2012-08-02 MED ORDER — PROPOFOL 10 MG/ML IV BOLUS
INTRAVENOUS | Status: DC | PRN
  Administered 2012-08-02: 60 mg via INTRAVENOUS

## 2012-08-02 MED ORDER — ACETAMINOPHEN 10 MG/ML IV SOLN: 1000.0000 mg | Freq: Once | INTRAVENOUS | Status: DC | PRN

## 2012-08-02 MED ORDER — LIDOCAINE HCL (CARDIAC) 20 MG/ML IV SOLN
INTRAVENOUS | Status: DC | PRN
  Administered 2012-08-02: 50 mg via INTRAVENOUS

## 2012-08-02 MED ORDER — NEOSTIGMINE METHYLSULFATE 1 MG/ML IJ SOLN
INTRAMUSCULAR | Status: DC | PRN
  Administered 2012-08-02: 5 mg via INTRAVENOUS

## 2012-08-02 MED ORDER — ONDANSETRON HCL 4 MG/2ML IJ SOLN
INTRAMUSCULAR | Status: DC | PRN
  Administered 2012-08-02: 4 mg via INTRAVENOUS

## 2012-08-02 MED ORDER — FENTANYL CITRATE 0.05 MG/ML IJ SOLN
INTRAMUSCULAR | Status: DC | PRN
  Administered 2012-08-02 (×2): 50 ug via INTRAVENOUS

## 2012-08-02 MED ORDER — 0.9 % SODIUM CHLORIDE (POUR BTL) OPTIME
TOPICAL | Status: DC | PRN
  Administered 2012-08-02 (×2): 1000 mL

## 2012-08-02 MED ORDER — DEXAMETHASONE SODIUM PHOSPHATE 10 MG/ML IJ SOLN
INTRAMUSCULAR | Status: DC | PRN
  Administered 2012-08-02: 10 mg via INTRAVENOUS

## 2012-08-02 MED ORDER — MEPERIDINE HCL 50 MG/ML IJ SOLN: 6.2500 mg | INTRAMUSCULAR | Status: DC | PRN

## 2012-08-02 MED ORDER — MIDAZOLAM HCL 5 MG/5ML IJ SOLN
INTRAMUSCULAR | Status: DC | PRN
  Administered 2012-08-02: 2 mg via INTRAVENOUS

## 2012-08-02 MED ORDER — ONDANSETRON HCL 4 MG/2ML IJ SOLN: 4.0000 mg | Freq: Four times a day (QID) | INTRAMUSCULAR | Status: DC | PRN

## 2012-08-02 MED ORDER — SUCCINYLCHOLINE CHLORIDE 20 MG/ML IJ SOLN
INTRAMUSCULAR | Status: DC | PRN
  Administered 2012-08-02: 100 mg via INTRAVENOUS

## 2012-08-02 MED ORDER — PROMETHAZINE HCL 25 MG/ML IJ SOLN: 6.2500 mg | INTRAMUSCULAR | Status: DC | PRN

## 2012-08-02 MED ORDER — MORPHINE SULFATE 2 MG/ML IJ SOLN
2.0000 mg | INTRAMUSCULAR | Status: DC | PRN
  Administered 2012-08-02 – 2012-08-03 (×3): 2 mg via INTRAVENOUS
  Administered 2012-08-04: 6 mg via INTRAVENOUS
  Administered 2012-08-04 – 2012-08-11 (×7): 2 mg via INTRAVENOUS
  Filled 2012-08-02 (×9): qty 1
  Filled 2012-08-02: qty 3
  Filled 2012-08-02 (×2): qty 1

## 2012-08-02 MED ORDER — POTASSIUM CHLORIDE IN NACL 20-0.9 MEQ/L-% IV SOLN
INTRAVENOUS | Status: DC
  Administered 2012-08-02 – 2012-08-03 (×2): via INTRAVENOUS
  Filled 2012-08-02 (×3): qty 1000

## 2012-08-02 MED ORDER — HYDROMORPHONE HCL PF 1 MG/ML IJ SOLN
0.2500 mg | INTRAMUSCULAR | Status: DC | PRN
  Administered 2012-08-02 (×2): 0.5 mg via INTRAVENOUS

## 2012-08-02 SURGICAL SUPPLY — 45 items
APPLICATOR COTTON TIP 6IN STRL (MISCELLANEOUS) ×1 IMPLANT
BLADE EXTENDED COATED 6.5IN (ELECTRODE) ×1 IMPLANT
BLADE HEX COATED 2.75 (ELECTRODE) ×2 IMPLANT
CANISTER SUCTION 2500CC (MISCELLANEOUS) ×2 IMPLANT
CLOTH BEACON ORANGE TIMEOUT ST (SAFETY) ×2 IMPLANT
COVER MAYO STAND STRL (DRAPES) ×1 IMPLANT
COVER SURGICAL LIGHT HANDLE (MISCELLANEOUS) ×1 IMPLANT
DRAPE LAPAROSCOPIC ABDOMINAL (DRAPES) ×2 IMPLANT
DRAPE UTILITY XL STRL (DRAPES) ×2 IMPLANT
DRAPE WARM FLUID 44X44 (DRAPE) ×1 IMPLANT
DRSG VAC ATS LRG SENSATRAC (GAUZE/BANDAGES/DRESSINGS) ×1 IMPLANT
ELECT REM PT RETURN 9FT ADLT (ELECTROSURGICAL) ×2
ELECTRODE REM PT RTRN 9FT ADLT (ELECTROSURGICAL) ×1 IMPLANT
GLOVE BIO SURGEON STRL SZ 6.5 (GLOVE) ×1 IMPLANT
GLOVE BIO SURGEON STRL SZ7 (GLOVE) ×2 IMPLANT
GLOVE BIO SURGEON STRL SZ7.5 (GLOVE) ×1 IMPLANT
GLOVE BIOGEL PI IND STRL 6.5 (GLOVE) IMPLANT
GLOVE BIOGEL PI IND STRL 7.0 (GLOVE) ×1 IMPLANT
GLOVE BIOGEL PI IND STRL 7.5 (GLOVE) ×1 IMPLANT
GLOVE BIOGEL PI INDICATOR 6.5 (GLOVE) ×1
GLOVE BIOGEL PI INDICATOR 7.0 (GLOVE)
GLOVE BIOGEL PI INDICATOR 7.5 (GLOVE) ×3
GLOVE SURG SS PI 8.0 STRL IVOR (GLOVE) ×2 IMPLANT
GOWN STRL NON-REIN LRG LVL3 (GOWN DISPOSABLE) ×4 IMPLANT
GOWN STRL REIN 2XL XLG LVL4 (GOWN DISPOSABLE) ×1 IMPLANT
GOWN STRL REIN XL XLG (GOWN DISPOSABLE) ×2 IMPLANT
KIT BASIN OR (CUSTOM PROCEDURE TRAY) ×2 IMPLANT
NS IRRIG 1000ML POUR BTL (IV SOLUTION) ×2 IMPLANT
PACK GENERAL/GYN (CUSTOM PROCEDURE TRAY) ×2 IMPLANT
SPONGE GAUZE 4X4 12PLY (GAUZE/BANDAGES/DRESSINGS) ×2 IMPLANT
SPONGE LAP 18X18 X RAY DECT (DISPOSABLE) IMPLANT
STAPLER VISISTAT 35W (STAPLE) ×2 IMPLANT
SUCTION POOLE TIP (SUCTIONS) ×1 IMPLANT
SUT PDS AB 1 CTX 36 (SUTURE) IMPLANT
SUT PDS AB 1 TP1 96 (SUTURE) ×2 IMPLANT
SUT SILK 2 0 (SUTURE) ×2
SUT SILK 2 0 SH CR/8 (SUTURE) ×1 IMPLANT
SUT SILK 2-0 18XBRD TIE 12 (SUTURE) IMPLANT
SUT SILK 3 0 (SUTURE) ×2
SUT SILK 3 0 SH CR/8 (SUTURE) ×1 IMPLANT
SUT SILK 3-0 18XBRD TIE 12 (SUTURE) IMPLANT
TOWEL OR 17X26 10 PK STRL BLUE (TOWEL DISPOSABLE) ×3 IMPLANT
TRAY FOLEY CATH 14FRSI W/METER (CATHETERS) ×2 IMPLANT
WATER STERILE IRR 1500ML POUR (IV SOLUTION) IMPLANT
YANKAUER SUCT BULB TIP NO VENT (SUCTIONS) ×1 IMPLANT

## 2012-08-02 NOTE — Progress Notes (Signed)
Inpatient: Post-op Wound infection and dehiscence Subjective: Patient reports no complaints.  Questioned about the events leading up to current admission with the only response "something went wrong."  Stating that she just laid in the bed at home when she was discharged from the hospital after surgery.  Reporting "I forgot" when asked about ambulation.  Denies pain, nausea, or vomiting.  Objective: Vital signs in last 24 hours: Temp:  [97 F (36.1 C)-98.6 F (37 C)] 98.1 F (36.7 C) (11/21 0809) Pulse Rate:  [77-91] 77  (11/21 0809) Resp:  [18-20] 18  (11/21 0809) BP: (109-153)/(72-75) 109/72 mmHg (11/21 0809) SpO2:  [97 %-100 %] 99 % (11/21 0809) Weight:  [221 lb 5 oz (100.387 kg)] 221 lb 5 oz (100.387 kg) (11/20 2300) Last BM Date:  (Pt didn't know when was her last BM. )  Intake/Output from previous day: 11/20 0701 - 11/21 0700 In: 428.3 [I.V.:428.3] Out: 500 [Urine:500]  Physical Examination: General: Lethargic, slow to respond to verbal stimuli, oriented to person, place, and time. Resp: diminished in bilateral bases Cardio: regular rate and rhythm, S1, S2 normal, no murmur, click, rub or gallop GI: abnormal findings:  hypoactive bowel sounds, obese and non-tender and incision: incision open with packing present, malodorous drainage present Extremities: extremities normal, atraumatic, no cyanosis or edema and mild bilateral pedal edema noted  Labs: WBC/Hgb/Hct/Plts:  8.3/8.3/27.1/257 (11/21 0437) BUN/Cr/glu/ALT/AST/amyl/lip:  44/2.72/--/--/--/--/-- (11/21 3086)  Assessment: 71 y.o. admitted for post-operative wound infection and dehiscence.  Status post: Diagnostic Laparoscopy, Total abdominal hysterectomy, Bilateral salpingo oophorectomy, Extensive enterolysis on 07/24/12    Pain:  No pain reported at this time.  Dilaudid IV ordered as needed.  ID: On IV Zosyn at this time.  WBC count 8.3 this am from 11.4 on 08/01/12.  CV:  History of CAD and cardiomegaly.  Clonidine  ordered PRN.  GI:  Tolerating po: No: NPO     GU:  Creatinine improving: 2.72 this am from 3.4 on 08/01/12.  Plan: Plans for patient to go to the OR today around 12:00pm with Dr. Tamela Oddi Continue IV antibiotics at this time Maintain NPO status   LOS: 1 day    Kirtis Challis DEAL 08/02/2012, 8:49 AM

## 2012-08-02 NOTE — Op Note (Signed)
Preop diagnosis: Abdominal wound dehiscence Postop diagnosis: Abdominal wound dehiscence with enterocutaneous fistula Procedure:  Exploratory laparotomy Surgeon:  Mahlon Gabrielle K. Assistant:  Dr. Avel Peace   Dr. Antionette Char Anesthesia:  GETT Indications:  The patient is a 71 year old female with multiple medical comorbidities who is 10 days postop from a total abdominal hysterectomy and bilateral salpingo-oophorectomy. She was discharged home 4 days ago. She has developed open upper wound with drainage of some very foul-smelling fluid. There is question whether this represents bowel contents or merely purulent drainage. The fascia is visualized and appears to be dehisced. The patient has significant renal dysfunction and required fluid resuscitation overnight. We are asked to explore her abdominal wound.  Description of procedure: The patient was brought to the operating room and placed in a supine position on the operating room table. After an adequate level of general anesthesia was obtained a central line was placed by anesthesia. An arterial line was also placed in her wrist. A Foley catheter was placed in the still technique. The patient has had significant urinary incontinence and has some erythema and maceration of the skin of her labia and upper thighs. However there does not seem to be any induration in this area. Her abdominal dressing was removed. The wound was widely opened. At the very superior portion of the wound, there appears to be a small area of succus entericus.  We removed the fascial sutures. We irrigated the wound thoroughly. It appears that there has been dehiscence of the fascia. There is exposed bowel in the wound. There is a tiny pinpoint area that appears to be mildly ischemic. This measures only couple millimeters across. We identified the fascia. We tried to bluntly dissect the bowel away from the undersurface of the abdominal wall. However in this early  postoperative timeframe, it was virtually impossible to dissect into the peritoneal cavity. The patient did not have any peritoneal signs prior to surgery. We carefully examined this small area of small bowel that appear slightly discolored. We compressed the surrounding abdominal wall and bowel but were unable to express any further drainage. The remainder the bowel appeared to be viable. We made the decision not to try to dissect in the patient's abdomen, as this would likely cause further damage.We again inspected the area of small bowel and no further leakage was noted. It is possible that this area has sealed itself off. We placed a VAC dressing by using a white sponge directly over the bowel. We then placed a medium black sponge over the white sponge. This was sealed off with an occlusive drape and placed to suction. There was a good seal. The patient was extubated and brought to recovery in stable condition. She'll be further resuscitated in the ICU.  Wilmon Arms. Corliss Skains, MD, [facility] Surgery  08/02/2012 4:28 PM

## 2012-08-02 NOTE — Transfer of Care (Signed)
Immediate Anesthesia Transfer of Care Note  Patient: Marissa [name]  Procedure(s) Performed: Procedure(s) (LRB) with comments: EXPLORATORY LAPAROTOMY (N/A) - placement of abdominal wound vac  Patient Location: PACU  Anesthesia Type:General  Level of Consciousness: sedated  Airway & Oxygen Therapy: Patient Spontanous Breathing and Patient connected to face mask oxygen  Post-op Assessment: Report given to PACU RN and Post -op Vital signs reviewed and stable  Post vital signs: Reviewed and stable  Complications: No apparent anesthesia complications

## 2012-08-02 NOTE — Progress Notes (Signed)
ANTIBIOTIC CONSULT NOTE - INITIAL  Pharmacy Consult for Zosyn Indication: Abdominal wound infx  Allergies  Allergen Reactions  . Amlodipine Itching    edema    Patient Measurements: Height: 5\' 5"  (165.1 cm) Weight: 221 lb 5 oz (100.387 kg) IBW/kg (Calculated) : 57   Vital Signs: Temp: 98 F (36.7 C) (11/21 1605) Temp src: Oral (11/21 0809) BP: 138/69 mmHg (11/21 1600) Pulse Rate: 84  (11/21 1600) Intake/Output from previous day: 11/20 0701 - 11/21 0700 In: 428.3 [I.V.:428.3] Out: 500 [Urine:500] Intake/Output from this shift: Total I/O In: 1800 [I.V.:1800] Out: 730 [Urine:700; Blood:30]  Labs:  [facility] 08/02/12 0437 08/02/12 0401 08/01/12 1803  WBC 8.3 -- 11.4*  HGB 8.3* -- 9.0*  PLT 257 -- 359  LABCREA -- 128.60 --  CREATININE 2.72* -- 3.40*   Estimated Creatinine Clearance: 22.3 ml/min (by C-G formula based on Cr of 2.72).    Microbiology: Recent Results (from the past 720 hour(s))  SURGICAL PCR SCREEN     Status: Normal   Collection Time   07/20/12 11:35 AM      Component Value Range Status Comment   MRSA, PCR NEGATIVE  NEGATIVE Final    Staphylococcus aureus NEGATIVE  NEGATIVE Final   SURGICAL PCR SCREEN     Status: Normal   Collection Time   08/02/12 10:31 AM      Component Value Range Status Comment   MRSA, PCR NEGATIVE  NEGATIVE Final    Staphylococcus aureus NEGATIVE  NEGATIVE Final     Medical History: Past Medical History  Diagnosis Date  . Hypertension   . Renal insufficiency     baseline Cr ~ 1.3  . Hyperlipidemia   . History of DVT (deep vein thrombosis)   . History of colonic polyps   . Gout   . Cardiomegaly   . CAD (coronary artery disease)     s/p stent placement 04/2005 and s/p PTCA in 04/2008 by Dr. Jonelle Sidle.  . Menorrhagia   . Polyarthritis   . Microcytic anemia   . OSA (obstructive sleep apnea)   . GERD (gastroesophageal reflux disease)   . Esophagitis   . Hidradenitis suppurativa     s/p axillary sweat gland removal  .  H/O blood transfusion reaction     at [facility]  . Shortness of breath     OCCASIONAL  . Headache   . Diverticulitis   . Difficulty sleeping   . PMB (postmenopausal bleeding)     X 2 YRS  . Bowel trouble     OCCASIONAL BOWEL INCONTINENCE    Medications:  Anti-infectives     Start     Dose/Rate Route Frequency Ordered Stop   08/01/12 2359   piperacillin-tazobactam (ZOSYN) IVPB 2.25 g        2.25 g 100 mL/hr over 30 Minutes Intravenous 3 times per day 08/01/12 2330           Assessment:  71 YOF on D#2 Zosyn 2.25g IV q 8h for wound infx. S/P exploratory lap for abdominal wound dehiscence with enterocutaneous fistula  Scr 2.72, CrCl 22  WBC wnl, Afebrile  Goal of Therapy:  Appropriate dose of zosyn Resolution of infx  Plan:   Change Zosyn to extended infusion 3.375g IV q8h, each dose over 4 hours  Monitor Scr/CrCl  Adjust dose as appropriate  Gwen Her PharmD  [phone] 08/02/2012 4:50 PM

## 2012-08-02 NOTE — Consult Note (Signed)
Examined patient and discussed with family - possible bowel injury from prior surgery.  Recommend exploratory laparotomy - possible bowel resection, possible ostomy.  Plan for ICU post-op  Wilmon Arms. Corliss Skains, MD, [facility] Surgery  08/02/2012 12:49 PM

## 2012-08-02 NOTE — Progress Notes (Signed)
eLink Physician-Brief Progress Note Patient Name: Marissa [name] DOB: 1941-03-01 MRN: [id number]  Date of Service  08/02/2012   HPI/Events of Note   Not intubated HR 74 MAP 90 RR 16 pusle ox 100% Looks stable Estimated Creatinine Clearance: 22.3 ml/min (by C-G formula based on Cr of 2.72).  Lab 08/02/12 0437 08/01/12 1803 07/28/12 0513 07/27/12 1455 07/27/12 0405  CREATININE 2.72* 3.40* 1.59* 1.73* 1.74*      eICU Interventions  Dc cvp measurements (PACU double lumen line cannot do cvp p per RN) Monitor clinically   Intervention Category Intermediate Interventions: Other:  [name] 08/02/2012, 5:47 PM

## 2012-08-02 NOTE — Brief Op Note (Signed)
08/01/2012 - 08/02/2012  2:30 PM  PATIENT:  Marissa [name]  71 y.o. female  PRE-OPERATIVE DIAGNOSIS:  abdominal wound drainage  POST-OPERATIVE DIAGNOSIS:  Abdominal fascial dehiscence; wound infection  PROCEDURE:  Procedure(s) (LRB) with comments: EXPLORATORY LAPAROTOMY (N/A) - placement of abdominal wound vac  SURGEON:  Surgeon(s) and Role:    Wilmon Arms. Corliss Skains, MD - Primary    * Antionette Char, MD    * Adolph Pollack, MD - Assisting  PHYSICIAN ASSISTANT:   ASSISTANTS: [place]   ANESTHESIA:   general  EBL:  Total I/O In: 1600 [I.V.:1600] Out: 380 [Urine:350; Blood:30]  BLOOD ADMINISTERED:none  DRAINS: VAC in midline wound   LOCAL MEDICATIONS USED:  NONE  SPECIMEN:  No Specimen  DISPOSITION OF SPECIMEN:  PATHOLOGY  COUNTS:  YES  TOURNIQUET:  * No tourniquets in log *  DICTATION: .Dragon Dictation  PLAN OF CARE: Admit to inpatient   PATIENT DISPOSITION:  PACU - hemodynamically stable.   Delay start of Pharmacological VTE agent (>24hrs) due to surgical blood loss or risk of bleeding: no  Wilmon Arms. Corliss Skains, MD, [facility] Surgery  08/02/2012 2:31 PM

## 2012-08-02 NOTE — Anesthesia Postprocedure Evaluation (Signed)
  Anesthesia Post-op Note  Patient: Marissa [name]  Procedure(s) Performed: Procedure(s) (LRB): EXPLORATORY LAPAROTOMY (N/A)  Patient Location: PACU  Anesthesia Type: General  Level of Consciousness: awake and alert   Airway and Oxygen Therapy: Patient Spontanous Breathing  Post-op Pain: mild  Post-op Assessment: Post-op Vital signs reviewed, Patient's Cardiovascular Status Stable, Respiratory Function Stable, Patent Airway and No signs of Nausea or vomiting  Last Vitals:  Filed Vitals:   08/02/12 1530  BP: 144/68  Pulse: 80  Temp: 36.6 C  Resp: 27    Post-op Vital Signs: stable   Complications: No apparent anesthesia complications

## 2012-08-02 NOTE — Anesthesia Preprocedure Evaluation (Addendum)
Anesthesia Evaluation  Patient identified by MRN, date of birth, ID band Patient awake    Reviewed: Allergy & Precautions, H&P , NPO status , Patient's Chart, lab work & pertinent test results  Airway Mallampati: III TM Distance: >3 FB Neck ROM: full    Dental  (+) Edentulous Upper and Dental Advisory Given   Pulmonary shortness of breath and with exertion, sleep apnea ,  breath sounds clear to auscultation  Pulmonary exam normal       Cardiovascular Exercise Tolerance: Good hypertension, Pt. on medications + CAD and + Cardiac Stents Rhythm:regular Rate:Normal  Stent 8/06.  PTCA 8/09   Neuro/Psych Cervical fusion negative neurological ROS  negative psych ROS   GI/Hepatic negative GI ROS, Neg liver ROS, GERD-  Medicated and Controlled,  Endo/Other  Morbid obesityNo meds  Renal/GU Renal InsufficiencyRenal diseaseVery mild renal insufficiency with baseline Cr 1.3  negative genitourinary   Musculoskeletal   Abdominal (+) + obese,   Peds  Hematology negative hematology ROS (+)   Anesthesia Other Findings   Reproductive/Obstetrics negative OB ROS                          Anesthesia Physical  Anesthesia Plan  ASA: III  Anesthesia Plan: General   Post-op Pain Management:    Induction: Intravenous  Airway Management Planned: Oral ETT  Additional Equipment: Arterial line and CVP  Intra-op Plan:   Post-operative Plan: Extubation in OR and Possible Post-op intubation/ventilation  Informed Consent: I have reviewed the patients History and Physical, chart, labs and discussed the procedure including the risks, benefits and alternatives for the proposed anesthesia with the patient or authorized representative who has indicated his/her understanding and acceptance.   Dental Advisory Given  Plan Discussed with: CRNA and Surgeon  Anesthesia Plan Comments: (Plan ICU post-op  Central line and  aline after induction explained to pt and she agrees.  Pt is NOT a difficult intubation. Done easily with Mac blade under DL)       Anesthesia Quick Evaluation

## 2012-08-02 NOTE — Consult Note (Signed)
PCP:   Sanda Linger, MD   Consult requested by: OB/GYN   Reason for consult: Acute kidney injury  HPI: This is a 71 year old female who approximately week ago had a total abdominal hysterectomy and oophrectomy. She subsequently had wound dehiscence. She was brought back in to [facility] hospital and transferred to [facility] long for surgical intervention. Laboratory evaluations revealed an elevated creatinine level. The patient states her urine output has remained normal. The patient denies any nausea, vomiting, fevers or chills. She does report some burning on urination. She denies any diarrhea. She meant to be patient is somewhat sleepy and mildly disoriented, patient attributes this to recent use of narcotics. She states she gets that this would've shifted medications. History provided by the patient.  Review of Systems:  The patient denies anorexia, fever, weight loss,, vision loss, decreased hearing, hoarseness, chest pain, syncope, dyspnea on exertion, peripheral edema, balance deficits, hemoptysis, abdominal pain, melena, hematochezia, severe indigestion/heartburn, hematuria, incontinence, genital sores, muscle weakness, suspicious skin lesions, transient blindness, difficulty walking, depression, unusual weight change, abnormal bleeding, enlarged lymph nodes, angioedema, and breast masses.  Past Medical History: Past Medical History  Diagnosis Date  . Hypertension   . Renal insufficiency     baseline Cr ~ 1.3  . Hyperlipidemia   . History of DVT (deep vein thrombosis)   . History of colonic polyps   . Gout   . Cardiomegaly   . CAD (coronary artery disease)     s/p stent placement 04/2005 and s/p PTCA in 04/2008 by Dr. Jonelle Sidle.  . Menorrhagia   . Polyarthritis   . Microcytic anemia   . OSA (obstructive sleep apnea)   . GERD (gastroesophageal reflux disease)   . Esophagitis   . Hidradenitis suppurativa     s/p axillary sweat gland removal  . H/O blood transfusion reaction     at  [facility] hospital  . Shortness of breath     OCCASIONAL  . Headache   . Diverticulitis   . Difficulty sleeping   . PMB (postmenopausal bleeding)     X 2 YRS  . Bowel trouble     OCCASIONAL BOWEL INCONTINENCE   Past Surgical History  Procedure Date  . Cervical fusion c-4-5 other   . Cervical laminectomy other   . Tonsillectomy other   . Umbilical hernia repair   . Axillary seat gland removal other   . Right knee arthroscopy other   . Colectomy 2004    left partial for perforation  . Cholecystectomy   . Tubal ligation   . Ptca and stenting     of mid and distal circumflex coronary artery. 1st stent was 04/2006, with drug eluting stent placed on 12/20/07 (30-50% RCA stenosis). Cardiologist Dr. Jacinto Halim.  . Dilation and curettage of uterus 02/2011  . Hysteroscopy w/d&c 05/04/2012    Procedure: DILATATION AND CURETTAGE /HYSTEROSCOPY;  Surgeon: Antionette Char, MD;  Location: [facility];  Service: Gynecology;  Laterality: N/A;  . Robotic assisted total hysterectomy with bilateral salpingo oopherectomy 07/24/2012    Procedure: ROBOTIC ASSISTED TOTAL HYSTERECTOMY WITH BILATERAL SALPINGO OOPHORECTOMY;  Surgeon: Laurette Schimke, MD PHD;  Location: [facility];  Service: Gynecology;  Laterality: Bilateral;  attempted ,  converted to abdomnial hysterectomy  . Abdominal hysterectomy 07/24/2012    Procedure: HYSTERECTOMY ABDOMINAL;  Surgeon: Laurette Schimke, MD PHD;  Location: [facility];  Service: Gynecology;;  . Salpingoophorectomy 07/24/2012    Procedure: SALPINGO OOPHORECTOMY;  Surgeon: Laurette Schimke, MD PHD;  Location: [facility];  Service: Gynecology;  Laterality:  Bilateral;    Medications: Prior to Admission medications   Medication Sig Start Date End Date Taking? Authorizing Provider  colchicine 0.6 MG tablet Take 1 tablet (0.6 mg total) by mouth daily. 02/15/12 02/14/13 Yes Etta Grandchild, MD  enoxaparin (LOVENOX) 40 MG/0.4ML injection Inject 40 mg into the skin daily.   Yes Historical Provider, MD  folic acid  (FOLVITE) 1 MG tablet Take 1 tablet (1 mg total) by mouth daily. 11/23/11 11/22/12 Yes Etta Grandchild, MD  naproxen (NAPROSYN) 500 MG tablet Take 500 mg by mouth 2 (two) times daily as needed. For gout pain 02/15/12 02/14/13 Yes Etta Grandchild, MD  olmesartan-hydrochlorothiazide (BENICAR HCT) 20-12.5 MG per tablet Take 1 tablet by mouth every morning. 11/23/11 11/22/12 Yes Etta Grandchild, MD  omeprazole (PRILOSEC) 20 MG capsule Take 20 mg by mouth every morning. 10/07/11 10/06/12 Yes Laurey Morale, MD  oxyCODONE-acetaminophen (PERCOCET/ROXICET) 5-325 MG per tablet Take 1-2 tablets by mouth every 4 (four) hours as needed for pain (moderate to severe pain (when tolerating fluids)). 07/29/12  Yes Brock Bad, MD  pravastatin (PRAVACHOL) 40 MG tablet Take 40 mg by mouth every morning.    Yes Historical Provider, MD    Allergies:   Allergies  Allergen Reactions  . Amlodipine Itching    edema    Social History:  reports that she has never smoked. She has never used smokeless tobacco. She reports that she does not drink alcohol or use illicit drugs. lives with daughter  Family History: Family History  Problem Relation Age of Onset  . Diabetes Mother   . Coronary artery disease Mother   . Diabetes Sister   . Coronary artery disease Sister     Physical Exam: Filed Vitals:   08/01/12 2300  BP: 129/75  Pulse: 89  Temp: 98.4 F (36.9 C)  TempSrc: Oral  Resp: 18  Height: 5\' 5"  (1.651 m)  Weight: 100.387 kg (221 lb 5 oz)  SpO2: 100%    General:  Alert and oriented times three, obese, no acute distress Eyes: PERRLA, pink conjunctiva, no scleral icterus ENT: Moist oral mucosa, neck supple, no thyromegaly Lungs: clear to ascultation, no wheeze, no crackles, no use of accessory muscles Cardiovascular: regular rate and rhythm, no regurgitation, no gallops, no murmurs. No carotid bruits, no JVD Abdomen: soft, positive BS, non-tender, non-distended, no organomegaly, not an acute abdomen,  dehisced surgical wound GU: not examined Neuro: CN II - XII grossly intact, sensation intact Musculoskeletal: strength 5/5 all extremities, no clubbing, cyanosis or edema Skin: no rash, no subcutaneous crepitation, no decubitus Psych: appropriate patient   Labs on Admission:   [facility] 08/01/12 2211 08/01/12 1803  NA -- 134*  K -- 4.0  CL -- 96  CO2 -- 23  GLUCOSE -- 110*  BUN -- 47*  CREATININE -- 3.40*  CALCIUM -- 9.5  MG -- --  PHOS 3.7 --    Basename 08/01/12 1803  AST 29  ALT 22  ALKPHOS 106  BILITOT 0.5  PROT 8.8*  ALBUMIN 2.8*   No results found for this basename: LIPASE:2,AMYLASE:2 in the last 72 hours  Basename 08/01/12 1803  WBC 11.4*  NEUTROABS --  HGB 9.0*  HCT 29.2*  MCV 68.1*  PLT 359     Micro Results: No results found for this or any previous visit (from the past 240 hour(s)).   Radiological Exams on Admission: No results found.  Assessment/Plan Present on Admission:   Acute on chronic kidney injury/ATN IV  fluid hydration Place Foley, strict I.'s and O.'s Discontinue any nephrotoxic medication colchicine, Naprosyn, ARB If creatinine does not improve with hydration would recommend nephrology consult Imaging study currently not ordered, will monitor order if needed Will order urinalysis to evaluate for infection Hypertension  Blood pressure medications will be ordered, especially as patient regular blood pressure medication has been held  Gout Dyslipidemia Coronary artery disease Obstructive sleep apnea Stable resume home medications with exception of those as noted above Wound dehiscence, status post a hysterectomy and Oophrectomy Per primary team   Recommend heparin once DVT prophylaxis restarted given acute kidney injury   Jamario Colina 08/02/2012, 2:01 AM

## 2012-08-02 NOTE — Consult Note (Signed)
Reason for Consult: Abd wound drainage/infection, possible bowel injury Referring Physician: Dr. Earline [name] is an 71 y.o. female.  HPI: 71 yr old female who underwent TAH/BSO/LOA on 11/12 by Dr. Tamela [name].  She was discharged on Sunday.  She reports that her wound was open and had a foul odor when she left the hospital.  She reports that over the next several days the wound was draining large amounts of brownish, foul smelling liquid from wound.  She has been feeling very weak and feverish.  She represented with a wound a infection and malaise.  We are consulted for possible bowel injury, wound dehiscence and wound infection.  She has a history of left colectomy for perforation, coronary stents, diverticulitis, cholecystectomy and h/o DVT.  She is not currently on anticoagulation.     Past Medical History  Diagnosis Date  . Hypertension   . Renal insufficiency     baseline Cr ~ 1.3  . Hyperlipidemia   . History of DVT (deep vein thrombosis)   . History of colonic polyps   . Gout   . Cardiomegaly   . CAD (coronary artery disease)     s/p stent placement 04/2005 and s/p PTCA in 04/2008 by Dr. Jonelle [name].  . Menorrhagia   . Polyarthritis   . Microcytic anemia   . OSA (obstructive sleep apnea)   . GERD (gastroesophageal reflux disease)   . Esophagitis   . Hidradenitis suppurativa     s/p axillary sweat gland removal  . H/O blood transfusion reaction     at [facility] hospital  . Shortness of breath     OCCASIONAL  . Headache   . Diverticulitis   . Difficulty sleeping   . PMB (postmenopausal bleeding)     X 2 YRS  . Bowel trouble     OCCASIONAL BOWEL INCONTINENCE    Past Surgical History  Procedure Date  . Cervical fusion c-4-5 other   . Cervical laminectomy other   . Tonsillectomy other   . Umbilical hernia repair   . Axillary seat gland removal other   . Right knee arthroscopy other   . Colectomy 2004    left partial for perforation  . Cholecystectomy   .  Tubal ligation   . Ptca and stenting     of mid and distal circumflex coronary artery. 1st stent was 04/2006, with drug eluting stent placed on 12/20/07 (30-50% RCA stenosis). Cardiologist Dr. Jacinto [name].  . Dilation and curettage of uterus 02/2011  . Hysteroscopy w/d&c 05/04/2012    Procedure: DILATATION AND CURETTAGE /HYSTEROSCOPY;  Surgeon: Marissa Char, MD;  Location: [facility];  Service: Gynecology;  Laterality: N/A;  . Robotic assisted total hysterectomy with bilateral salpingo oopherectomy 07/24/2012    Procedure: ROBOTIC ASSISTED TOTAL HYSTERECTOMY WITH BILATERAL SALPINGO OOPHORECTOMY;  Surgeon: Marissa Schimke, MD PHD;  Location: [facility];  Service: Gynecology;  Laterality: Bilateral;  attempted ,  converted to abdomnial hysterectomy  . Abdominal hysterectomy 07/24/2012    Procedure: HYSTERECTOMY ABDOMINAL;  Surgeon: Marissa Schimke, MD PHD;  Location: [facility];  Service: Gynecology;;  . Salpingoophorectomy 07/24/2012    Procedure: SALPINGO OOPHORECTOMY;  Surgeon: Marissa Schimke, MD PHD;  Location: [facility];  Service: Gynecology;  Laterality: Bilateral;    Family History  Problem Relation Age of Onset  . Diabetes Mother   . Coronary artery disease Mother   . Diabetes Sister   . Coronary artery disease Sister     Social History:  reports that she has never smoked. She  has never used smokeless tobacco. She reports that she does not drink alcohol or use illicit drugs.  Allergies:  Allergies  Allergen Reactions  . Amlodipine Itching    edema    Medications: I have reviewed the patient's current medications.  Results for orders placed during the hospital encounter of 08/01/12 (from the past 48 hour(s))  PROTEIN / CREATININE RATIO, URINE     Status: Abnormal   Collection Time   08/02/12  4:01 AM      Component Value Range Comment   Creatinine, Urine 128.60      Total Protein, Urine 62.1   NO NORMAL RANGE ESTABLISHED FOR THIS TEST   PROTEIN CREATININE RATIO 0.48 (*) 0.00 - 0.15     URINALYSIS, ROUTINE W REFLEX MICROSCOPIC     Status: Abnormal   Collection Time   08/02/12  4:01 AM      Component Value Range Comment   Color, Urine YELLOW  YELLOW    APPearance CLOUDY (*) CLEAR    Specific Gravity, Urine 1.019  1.005 - 1.030    pH 5.0  5.0 - 8.0    Glucose, UA NEGATIVE  NEGATIVE mg/dL    Hgb urine dipstick LARGE (*) NEGATIVE    Bilirubin Urine NEGATIVE  NEGATIVE    Ketones, ur NEGATIVE  NEGATIVE mg/dL    Protein, ur 30 (*) NEGATIVE mg/dL    Urobilinogen, UA 0.2  0.0 - 1.0 mg/dL    Nitrite POSITIVE (*) NEGATIVE    Leukocytes, UA MODERATE (*) NEGATIVE   URINE MICROSCOPIC-ADD ON     Status: Abnormal   Collection Time   08/02/12  4:01 AM      Component Value Range Comment   Squamous Epithelial / LPF RARE  RARE    WBC, UA 11-20  <3 WBC/hpf    RBC / HPF 0-2  <3 RBC/hpf    Bacteria, UA FEW (*) RARE   CBC     Status: Abnormal   Collection Time   08/02/12  4:37 AM      Component Value Range Comment   WBC 8.3  4.0 - 10.5 K/uL    RBC 4.08  3.87 - 5.11 MIL/uL    Hemoglobin 8.3 (*) 12.0 - 15.0 g/dL    HCT 45.4 (*) 09.8 - 46.0 %    MCV 66.4 (*) 78.0 - 100.0 fL    MCH 20.3 (*) 26.0 - 34.0 pg    MCHC 30.6  30.0 - 36.0 g/dL    RDW 11.9  14.7 - 82.9 %    Platelets 257  150 - 400 K/uL   BASIC METABOLIC PANEL     Status: Abnormal   Collection Time   08/02/12  4:37 AM      Component Value Range Comment   Sodium 136  135 - 145 mEq/L    Potassium 4.0  3.5 - 5.1 mEq/L    Chloride 100  96 - 112 mEq/L    CO2 22  19 - 32 mEq/L    Glucose, Bld 95  70 - 99 mg/dL    BUN 44 (*) 6 - 23 mg/dL    Creatinine, Ser 5.62 (*) 0.50 - 1.10 mg/dL    Calcium 8.8  8.4 - 13.0 mg/dL    GFR calc non Af Amer 16 (*) >90 mL/min    GFR calc Af Amer 19 (*) >90 mL/min   APTT     Status: Abnormal   Collection Time   08/02/12  4:37 AM  Component Value Range Comment   aPTT <20 (*) 24 - 37 seconds SPECIMEN CHECKED FOR CLOTS    No results found.  Review of Systems  Constitutional:  Positive for malaise/fatigue. Negative for chills and weight loss.  HENT: Negative.  Negative for neck pain.   Eyes: Negative.   Respiratory: Negative.   Cardiovascular: Negative.   Gastrointestinal: Positive for abdominal pain. Negative for nausea, vomiting and melena.  Genitourinary: Negative.   Musculoskeletal: Positive for myalgias. Negative for back pain.  Skin: Positive for rash (medial thighs).  Neurological: Positive for weakness. Negative for dizziness, tingling and tremors.  Endo/Heme/Allergies: Negative.   Psychiatric/Behavioral: Negative.    Blood pressure 109/72, pulse 77, temperature 98.1 F (36.7 C), temperature source Oral, resp. rate 18, height 5\' 5"  (1.651 m), weight 221 lb 5 oz (100.387 kg), SpO2 99.00%. Physical Exam  Constitutional: She is oriented to person, place, and time. She appears well-developed and well-nourished. No distress.  HENT:  Head: Normocephalic and atraumatic.  Eyes: Conjunctivae normal are normal. Pupils are equal, round, and reactive to light.  Neck: Normal range of motion. Neck supple.  Cardiovascular: Normal rate and regular rhythm.   Respiratory: Effort normal and breath sounds normal.  GI: Bowel sounds are normal. She exhibits no distension. There is tenderness (significant around wound).       Large open wound below umbilicus that extends to pubic symphysis.  Base of wound seems to be dehisced and there is large amount of succus.  The wound tissue appears beefy red and healthy in upper portions but saturated in lower near base of wound.  Stool smell from wound.  Musculoskeletal: She exhibits edema (bilateral lower ext.).  Neurological: She is alert and oriented to person, place, and time.  Skin: Rash (medial thighs secondary to drainage from wound) noted.  Psychiatric: She has a normal mood and affect. Her behavior is normal.       Mentation is slow     Assessment/Plan: 1.  Abdominal wound dehiscence, wound infection and likely small  bowel injury: the patient needs emergent abdominal surgery, possible bowel resection.  She is critically ill and has multiple co-morbidities.  She will need critical care management post-operative.  Dr. Corliss Skains has seen and evaluated the patient as well.    Marissa [name] 08/02/2012, 10:03 AM

## 2012-08-02 NOTE — Care Management Note (Addendum)
    Page 1 of 2   08/14/2012     2:44:43 PM   CARE MANAGEMENT NOTE 08/14/2012  Patient:  Marissa [name], Marissa [name]   Account Number:  [id number]  Date Initiated:  08/02/2012  Documentation initiated by:  Lorenda Ishihara  Subjective/Objective Assessment:   71 yo female admitted with wound dehisence, acute kidney injury with elevated creatinine. PTA lived at home with dtr.     Action/Plan:   Home with home health when stable   Anticipated DC Date:  08/14/2012   Anticipated DC Plan:  HOME W HOME HEALTH SERVICES      DC Planning Services  CM consult      PAC Choice  DURABLE MEDICAL EQUIPMENT  HOME HEALTH   Choice offered to / List presented to:  C-1 Patient   DME arranged  [facility]      DME agency  [facility]     HH arranged  HH-1 RN  HH-2 PT      [facility] agency  [facility]   Status of service:  Completed, signed off Medicare Important Message given?   (If response is "NO", the following Medicare IM given date fields will be blank) Date Medicare IM given:   Date Additional Medicare IM given:    Discharge Disposition:  HOME W HOME HEALTH SERVICES  Per UR Regulation:  Reviewed for med. necessity/level of care/duration of stay  If discussed at Long Length of Stay Meetings, dates discussed:    Comments:  08-13-12 Lorenda Ishihara RN CM 1300 Spoke with patient at bedside. States she doesn't have a preference for [facility] agency. Attempted to contact her dtrs for choice, unable to reach via numbers provided. Benefits check at insurance provider, [facility] was preferred provider, contacted Darl Pikes with [facility] to arrange wound care, possibly need PT as well. Attending prefers [facility] device for neg pressure wound care, contacted Rickie with [employer] to assist with filing for DME. Rickie will come at 1500 today to file paperwork for insurance approval. Anticipate d/c next 1-2 days.  [id number] Earlene Plater, RN, BSN, CCM: CHART REVIEWED AND UPDATED.  Next chart review due on [id number].pt transferred out of icu to acute  care floor on [id number] NO DISCHARGE NEEDS PRESENT AT THIS TIME. CASE MANAGEMENT [phone]   [id number] Earlene Plater, RN, BSN, CCM: CHART REVIEWED AND UPDATED.  Next chart review due on [id number]. NO DISCHARGE NEEDS PRESENT AT THIS TIME. CASE MANAGEMENT [phone]

## 2012-08-02 NOTE — Consult Note (Signed)
PULMONARY  / CRITICAL CARE MEDICINE  Name: Marissa [name] MRN: [id number] DOB: 1941-07-03    LOS: 1  REFERRING MD : Corliss Skains  CHIEF COMPLAINT: Post op resp failure, renal failure  BRIEF PATIENT DESCRIPTION: 71 yo post TAH 11-12 per Dr. Nelly Rout for post menopausal bleeding. 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  LINES / TUBES:  11-21 rt i j cvl>> 11-21 a line>>  CULTURES: MRSA 11/21 > neg Urine 11/21 >>>   ANTIBIOTICS: 11-21 zoysn>>  SIGNIFICANT EVENTS:  11-21 to ORAbdominal wound dehiscence with enterocutaneous fistula And abd wound vac placement   LEVEL OF CARE:  ICU PRIMARY SERVICE:  Surgery CONSULTANTS:  [facility], [facility], [facility] CODE STATUS: Full DIET:  NPO DVT Px:  PAS GI Px:  PPI  HISTORY OF PRESENT ILLNESS:   71 yo post TAH 11-12 per Dr. Nelly Rout for post menopausal bleeding. She returned to hospital 11-20 with open wound and presumed infection and went to the OR 11-21. She was further notable for renal insuffiencey.Creatine 1.74 11-15 and 3.4 on 11-20 and 2.72 on 11-21 treated with ivf and stopping diuretics. General surgery was consulted and she for exploratory laparotomy due to suspected  stool leaking from surgical wound. PCCM may be consulted 11-21.  PAST MEDICAL HISTORY :  Past Medical History  Diagnosis Date  . Hypertension   . Renal insufficiency     baseline Cr ~ 1.3  . Hyperlipidemia   . History of DVT (deep vein thrombosis)   . History of colonic polyps   . Gout   . Cardiomegaly   . CAD (coronary artery disease)     s/p stent placement 04/2005 and s/p PTCA in 04/2008 by Dr. Jonelle Sidle.  . Menorrhagia   . Polyarthritis   . Microcytic anemia   . OSA (obstructive sleep apnea)   . GERD (gastroesophageal reflux disease)   . Esophagitis   .  Hidradenitis suppurativa     s/p axillary sweat gland removal  . H/O blood transfusion reaction     at [facility] hospital  . Shortness of breath     OCCASIONAL  . Headache   . Diverticulitis   . Difficulty sleeping   . PMB (postmenopausal bleeding)     X 2 YRS  . Bowel trouble     OCCASIONAL BOWEL INCONTINENCE   Past Surgical History  Procedure Date  . Cervical fusion c-4-5 other   . Cervical laminectomy other   . Tonsillectomy other   . Umbilical hernia repair   . Axillary seat gland removal other   . Right knee arthroscopy other   . Colectomy 2004    left partial for perforation  . Cholecystectomy   . Tubal ligation   . Ptca and stenting     of mid and distal circumflex coronary artery. 1st stent was 04/2006, with drug eluting stent placed on 12/20/07 (30-50% RCA stenosis). Cardiologist Dr. Jacinto Halim.  . Dilation and curettage of uterus 02/2011  . Hysteroscopy w/d&c 05/04/2012    Procedure: DILATATION AND CURETTAGE /HYSTEROSCOPY;  Surgeon: Antionette Char, MD;  Location: [facility];  Service: Gynecology;  Laterality: N/A;  . Robotic assisted total hysterectomy with bilateral salpingo oopherectomy 07/24/2012    Procedure: ROBOTIC ASSISTED TOTAL HYSTERECTOMY WITH BILATERAL SALPINGO OOPHORECTOMY;  Surgeon: Laurette Schimke, MD PHD;  Location: [facility];  Service: Gynecology;  Laterality: Bilateral;  attempted ,  converted to abdomnial hysterectomy  . Abdominal hysterectomy 07/24/2012    Procedure: HYSTERECTOMY ABDOMINAL;  Surgeon: Laurette Schimke, MD PHD;  Location: [facility];  Service: Gynecology;;  . Salpingoophorectomy 07/24/2012    Procedure: SALPINGO OOPHORECTOMY;  Surgeon: Laurette Schimke, MD PHD;  Location: [facility];  Service: Gynecology;  Laterality: Bilateral;  . Abdominal wound dehiscence 08/01/2012    Procedure: ABDOMINAL WOUND DEHISCENCE;  Surgeon: Antionette Char, MD;  Location: [facility];  Service: Gynecology;  Laterality: N/A;   Prior to Admission medications   Medication Sig Start Date  End Date Taking? Authorizing Provider  colchicine 0.6 MG tablet Take 1 tablet (0.6 mg total) by mouth daily. 02/15/12 02/14/13 Yes Etta Grandchild, MD  enoxaparin (LOVENOX) 40 MG/0.4ML injection Inject 40 mg into the skin daily.   Yes Historical Provider, MD  folic acid (FOLVITE) 1 MG tablet Take 1 tablet (1 mg total) by mouth daily. 11/23/11 11/22/12 Yes Etta Grandchild, MD  naproxen (NAPROSYN) 500 MG tablet Take 500 mg by mouth 2 (two) times daily as needed. For gout pain 02/15/12 02/14/13 Yes Etta Grandchild, MD  olmesartan-hydrochlorothiazide (BENICAR HCT) 20-12.5 MG per tablet Take 1 tablet by mouth every morning. 11/23/11 11/22/12 Yes Etta Grandchild, MD  omeprazole (PRILOSEC) 20 MG capsule Take 20 mg by mouth every morning. 10/07/11 10/06/12 Yes Laurey Morale, MD  oxyCODONE-acetaminophen (PERCOCET/ROXICET) 5-325 MG per tablet Take 1-2 tablets by mouth every 4 (four) hours as needed for pain (moderate to severe pain (when tolerating fluids)). 07/29/12  Yes Brock Bad, MD  pravastatin (PRAVACHOL) 40 MG tablet Take 40 mg by mouth every morning.    Yes Historical Provider, MD   Allergies  Allergen Reactions  . Amlodipine Itching    edema    FAMILY HISTORY:  Family History  Problem Relation Age of Onset  . Diabetes Mother   . Coronary artery disease Mother   . Diabetes Sister   . Coronary artery disease Sister    SOCIAL HISTORY:  reports that she has never smoked. She has never used smokeless tobacco. She reports that she does not drink alcohol or use illicit drugs.      INTERVAL HISTORY:  Seen in PACU, sedated, nods breathing ok on 02 NP, making good urine so far, hemodynamics ok off pressors  VITAL SIGNS: Temp:  [97 F (36.1 C)-98.6 F (37 C)] 98.1 F (36.7 C) (11/21 0809) Pulse Rate:  [77-91] 77  (11/21 0809) Resp:  [18-20] 18  (11/21 0809) BP: (109-153)/(72-75) 109/72 mmHg (11/21 0809) SpO2:  [97 %-100 %] 99 % (11/21 0809) Weight:  [100.387 kg (221 lb 5 oz)] 100.387 kg (221 lb 5  oz) (11/20 2300) FIO2  2lpm   HEMODYNAMICS:   VENTILATOR SETTINGS:   INTAKE / OUTPUT: Intake/Output      11/20 0701 - 11/21 0700 11/21 0701 - 11/22 0700   I.V. (mL/kg) 428.3 (4.3) 700 (7)   Total Intake(mL/kg) 428.3 (4.3) 700 (7)   Urine (mL/kg/hr) 500 (0.2) 350 (0.6)   Total Output 500 350   Net -71.7 +350        Urine Occurrence 1 x         PHYSICAL EXAMINATION: General:  WNWD AAF  Neuro:  Intact HEENT:  NO LAN Cardiovascular:  HSR RRR Lungs:  Diminished bs thruout Abdomen:  Abd dressing intact Musculoskeletal:  intact Skin:  warm   LABS: cbc  Lab 08/02/12 0437 08/01/12 1803 07/28/12 0513  WBC 8.3 11.4* 12.6*  HGB 8.3* 9.0* 8.7*  HCT 27.1* 29.2* 27.8*   chemistry  Lab 08/02/12 0437 08/01/12 2211 08/01/12 1803 07/28/12 0513 07/27/12 0405  NA 136 -- 134* 133* --  K 4.0 -- 4.0 4.5 --  CO2 22 -- 23 25 --  GLUCOSE 95 -- 110* 122* --  BUN 44* -- 47* 16 --  CREATININE 2.72* -- 3.40* 1.59* --  CALCIUM 8.8 -- 9.5 9.5 --  MG -- -- -- -- 2.1  PHOS -- 3.7 -- -- --   Liver fxn  Lab 08/01/12 1803  AST 29  ALT 22  ALKPHOS 106  BILITOT 0.5  PROT 8.8*  ALBUMIN 2.8*   coags  Lab 08/02/12 0437 08/01/12 2211  APTT <20* 25  INR -- 1.13   Sepsis markers No results found for this basename: LATICACIDVEN:3,PROCALCITON:3 in the last 168 hours Cardiac markers No results found for this basename: CKTOTAL:3,CKMB:3,TROPONINI:3 in the last 168 hours BNP No results found for this basename: PROBNP:3 in the last 168 hours ABG No results found for this basename: PHART:3,PCO2ART:3,PO2ART:3,HCO3:3,TCO2:3 in the last 168 hours  CBG trend No results found for this basename: GLUCAP:5 in the last 168 hours  IMAGING: No results found.     DIAGNOSES: Principal Problem:  *Wound dehiscence, surgical Active Problems:  Acute kidney injury   ASSESSMENT / PLAN:  PULMONARY  ASSESSMENT: 02 dep resp failure No acute issue PLAN:   Pulmonary toilet/ supplemental 02 to  maintain sats > 94%  CARDIOVASCULAR  ASSESSMENT:  Hx of HTN PLAN:  Hold all anthypertensives  RENAL Lab Results  Component Value Date   CREATININE 2.72* 08/02/2012   CREATININE 3.40* 08/01/2012   CREATININE 1.59* 07/28/2012   CREATININE 1.23* 08/24/2011     ASSESSMENT:   Renal Insuff acute on chronic on arb preop PLAN:   DC antihypertensives and nsaids  Hydration Renal US in future if creatine worsens Serial cvp's   GASTROINTESTINAL  ASSESSMENT:   Suspected bowel compromise. Post op 11-21 . Small bowel with small hole. Will require follow up surgery.  PLAN:   Per [facility]  HEMATOLOGIC  Basename 08/02/12 0437 08/01/12 1803  HGB 8.3* 9.0*   ASSESSMENT:   Anemia with surgery pending(hx folate def) Hx of DVT PLAN:  Transfuse as needed PAS or Heparin post op  INFECTIOUS  ASSESSMENT:   Open wound with enterocutaneous fistula PLAN:   Per flow sheet, zosyn adequate gi coverage    ENDOCRINE  ASSESSMENT:  No acute issue   PLAN:     NEUROLOGIC  ASSESSMENT:  No acute issue   PLAN:      The patient is critically ill with multiple organ systems failure and requires high complexity decision making for assessment and support, frequent evaluation and titration of therapies, application of advanced monitoring technologies and extensive interpretation of multiple databases. Critical Care Time devoted to patient care services described in this note is 45 minutes.     Sandrea Hughs, MD Pulmonary and Critical Care Medicine [facility] Cell [phone]

## 2012-08-02 NOTE — Progress Notes (Addendum)
Patient seen and examined; vital signs stable and patient afebrile. She has been transferred after midnight from [facility] hospital for further surgical debridement and IM consulted due to acute on chronic renal failure. Dr. Haroldine Laws has seen patient early this morning in consultation, please referred to her note for further details. Essentially her acute on chronic renal failure are secondary to continue use of ARB/HCTZ, also due to UTI and dehydration. Patient Cr is already trending down (2.7 today).  -Will continue abx's (zosyn) and follow urine cultures -Continue IVF's -Stop nephrotoxic agents for now. -BMET in am  I was contacted by surgery service and the plan is for immediate abdominal surgery and with anticipation of critical care services post surgery due to type of surgery and prolong OR procedure. At this point will allow CCM to provide appropriate assistance and will be available if we can provide any further assistance in Marissa [name] care at some point. Please call us back with any needs and for any further recommendations.  Marissa [name] [phone]

## 2012-08-03 ENCOUNTER — Inpatient Hospital Stay (HOSPITAL_COMMUNITY): Payer: Medicare Other

## 2012-08-03 ENCOUNTER — Encounter (HOSPITAL_COMMUNITY): Payer: Self-pay | Admitting: Surgery

## 2012-08-03 LAB — BASIC METABOLIC PANEL
BUN: 36 mg/dL — ABNORMAL HIGH (ref 6–23)
Chloride: 105 mEq/L (ref 96–112)
Glucose, Bld: 118 mg/dL — ABNORMAL HIGH (ref 70–99)
Potassium: 5.1 mEq/L (ref 3.5–5.1)

## 2012-08-03 LAB — CBC
HCT: 23.2 % — ABNORMAL LOW (ref 36.0–46.0)
Hemoglobin: 7.1 g/dL — ABNORMAL LOW (ref 12.0–15.0)
MCH: 20.5 pg — ABNORMAL LOW (ref 26.0–34.0)
MCHC: 30.6 g/dL (ref 30.0–36.0)

## 2012-08-03 LAB — PHOSPHORUS: Phosphorus: 4.4 mg/dL (ref 2.3–4.6)

## 2012-08-03 LAB — GLUCOSE, CAPILLARY: Glucose-Capillary: 105 mg/dL — ABNORMAL HIGH (ref 70–99)

## 2012-08-03 MED ORDER — FAT EMULSION 20 % IV EMUL
250.0000 mL | INTRAVENOUS | Status: AC
  Administered 2012-08-03: 250 mL via INTRAVENOUS
  Filled 2012-08-03: qty 250

## 2012-08-03 MED ORDER — LACTATED RINGERS IV SOLN
INTRAVENOUS | Status: DC
  Administered 2012-08-04: 06:00:00 via INTRAVENOUS

## 2012-08-03 MED ORDER — INSULIN ASPART 100 UNIT/ML ~~LOC~~ SOLN
0.0000 [IU] | Freq: Four times a day (QID) | SUBCUTANEOUS | Status: DC
  Administered 2012-08-04 – 2012-08-05 (×7): 1 [IU] via SUBCUTANEOUS
  Administered 2012-08-06: 2 [IU] via SUBCUTANEOUS
  Administered 2012-08-07 – 2012-08-10 (×12): 1 [IU] via SUBCUTANEOUS
  Administered 2012-08-11: via SUBCUTANEOUS
  Administered 2012-08-11: 1 [IU] via SUBCUTANEOUS

## 2012-08-03 MED ORDER — BIOTENE DRY MOUTH MT LIQD
15.0000 mL | Freq: Two times a day (BID) | OROMUCOSAL | Status: DC
  Administered 2012-08-03 – 2012-08-14 (×20): 15 mL via OROMUCOSAL

## 2012-08-03 MED ORDER — CHLORHEXIDINE GLUCONATE 0.12 % MT SOLN
15.0000 mL | Freq: Two times a day (BID) | OROMUCOSAL | Status: DC
  Administered 2012-08-03 – 2012-08-15 (×24): 15 mL via OROMUCOSAL
  Filled 2012-08-03 (×29): qty 15

## 2012-08-03 MED ORDER — TRACE MINERALS CR-CU-F-FE-I-MN-MO-SE-ZN IV SOLN
INTRAVENOUS | Status: AC
  Administered 2012-08-03: 18:00:00 via INTRAVENOUS
  Filled 2012-08-03: qty 1000

## 2012-08-03 MED ORDER — SODIUM CHLORIDE 0.9 % IJ SOLN
10.0000 mL | Freq: Two times a day (BID) | INTRAMUSCULAR | Status: DC
  Administered 2012-08-03 – 2012-08-05 (×4): 10 mL
  Administered 2012-08-05: 20 mL
  Administered 2012-08-07 – 2012-08-09 (×4): 10 mL
  Administered 2012-08-10: 20 mL
  Administered 2012-08-14: 10 mL

## 2012-08-03 MED ORDER — SODIUM CHLORIDE 0.9 % IJ SOLN
10.0000 mL | INTRAMUSCULAR | Status: DC | PRN
  Administered 2012-08-10 – 2012-08-13 (×3): 10 mL

## 2012-08-03 NOTE — Progress Notes (Signed)
eLink Physician-Brief Progress Note Patient Name: Marissa [name] DOB: May 17, 1941 MRN: [id number]  Date of Service  08/03/2012   HPI/Events of Note  Call from nurse reporting malfunction of cordis.  IV fluid injected into side port with fluid coming out of top entrance that should have a one way stop.     eICU Interventions  Plan: Obtain additional PIV access D/C cordis   Intervention Category Minor Interventions: Routine modifications to care plan (e.g. PRN medications for pain, fever)  DETERDING,ELIZABETH 08/03/2012, 12:40 AM

## 2012-08-03 NOTE — Progress Notes (Signed)
INITIAL ADULT NUTRITION ASSESSMENT Date: 08/03/2012   Time: 11:22 AM  Reason for Assessment: Consult, New TPN   INTERVENTION: TPN per pharmacy [phone] kcal, 80-100 g protein) RD will monitor tolerance  ASSESSMENT: Female 71 y.o.  Dx: Wound dehiscence, surgical  Hx:  Past Medical History  Diagnosis Date  . Hypertension   . Renal insufficiency     baseline Cr ~ 1.3  . Hyperlipidemia   . History of DVT (deep vein thrombosis)   . History of colonic polyps   . Gout   . Cardiomegaly   . CAD (coronary artery disease)     s/p stent placement 04/2005 and s/p PTCA in 04/2008 by Dr. Jonelle Sidle.  . Menorrhagia   . Polyarthritis   . Microcytic anemia   . OSA (obstructive sleep apnea)   . GERD (gastroesophageal reflux disease)   . Esophagitis   . Hidradenitis suppurativa     s/p axillary sweat gland removal  . H/O blood transfusion reaction     at [facility] hospital  . Shortness of breath     OCCASIONAL  . Headache   . Diverticulitis   . Difficulty sleeping   . PMB (postmenopausal bleeding)     X 2 YRS  . Bowel trouble     OCCASIONAL BOWEL INCONTINENCE    Related Meds:     . antiseptic oral rinse  15 mL Mouth Rinse q12n4p  . chlorhexidine  15 mL Mouth Rinse BID  . enoxaparin  30 mg Subcutaneous Q24H  . insulin aspart  0-9 Units Subcutaneous Q6H  . piperacillin-tazobactam (ZOSYN)  IV  3.375 g Intravenous Q8H  . [DISCONTINUED] piperacillin-tazobactam (ZOSYN)  IV  2.25 g Intravenous Q8H     Ht: 5\' 5"  (165.1 cm)  Wt: 236 lb 8.9 oz (107.3 kg)  Ideal Wt: 57 kg  % Ideal Wt: 189%  Usual Wt: Unknown % Usual Wt: Unknown  Body mass index is 39.36 kg/(m^2). Patient is obese.   Food/Nutrition Related Hx: Patient with recent hysterectomy, admitted with wound dehiscence and acute on chronic renal failure. She is s/p exploratory laparotomy with abdominal wound vac placed, small bowel with small hole. TPN starting this evening for prolonged ileus. Clinimix 5/15 at 40 ml/hr with  lipids at 10 ml/hr MWF. Waiting on RD recommendations for goal rate.   Labs:  CMP     Component Value Date/Time   NA 137 08/03/2012 0430   K 5.1 08/03/2012 0430   CL 105 08/03/2012 0430   CO2 21 08/03/2012 0430   GLUCOSE 118* 08/03/2012 0430   BUN 36* 08/03/2012 0430   CREATININE 1.92* 08/03/2012 0430   CREATININE 1.23* 08/24/2011 1133   CALCIUM 8.6 08/03/2012 0430   PROT 8.8* 08/01/2012 1803   ALBUMIN 2.8* 08/01/2012 1803   AST 29 08/01/2012 1803   ALT 22 08/01/2012 1803   ALKPHOS 106 08/01/2012 1803   BILITOT 0.5 08/01/2012 1803   GFRNONAA 25* 08/03/2012 0430   GFRAA 29* 08/03/2012 0430    Intake/Output Summary (Last 24 hours) at 08/03/12 1125 Last data filed at 08/03/12 1000  Gross per 24 hour  Intake 4276.67 ml  Output   1750 ml  Net 2526.67 ml     Diet Order: NPO  Supplements/Tube Feeding: Clinimix 5/15 at 40 ml/hr with lipids at 10 ml/hr MWF  IVF:    TPN (CLINIMIX) +/- additives   And   fat emulsion   lactated ringers Last Rate: 125 mL/hr at 08/03/12 0855  lactated ringers   [DISCONTINUED] 0.9 %  NaCl with KCl 20 mEq / L Last Rate: 125 mL/hr at 08/03/12 0305  [DISCONTINUED] lactated ringers     Estimated Nutritional Needs:   Kcal: 4098-1191 kcal Protein: 80-100 g Fluid: >2 L  NUTRITION DIAGNOSIS: -Inadequate oral intake (NI-2.1).  Status: Ongoing  RELATED TO: prolonged ileus  AS EVIDENCE BY: NPO status  MONITORING/EVALUATION(Goals): Patient will meet 90-100% of estimated nutrition needs with TPN.   Monitor: TPN initiation, advancement, and tolerance, weight, labs  EDUCATION NEEDS: -No education needs identified at this time   Linnell Fulling, RD, LDN Pager #: [phone] After-Hours Pager #: [phone]   DOCUMENTATION CODES Per approved criteria  -Obesity Unspecified    Linnell Fulling [facility] 08/03/2012, 11:22 AM

## 2012-08-03 NOTE — Progress Notes (Addendum)
PARENTERAL NUTRITION CONSULT NOTE - INITIAL  Pharmacy Consult for TPN Indication: Expected prolong ileus  Allergies  Allergen Reactions  . Amlodipine Itching    edema    Patient Measurements: Height: 5\' 5"  (165.1 cm) Weight: 236 lb 8.9 oz (107.3 kg) IBW/kg (Calculated) : 57  Adjusted Body Weight: 72 Usual Weight: 100 kg  Vital Signs: Temp: 97.5 F (36.4 C) (11/22 0400) Temp src: Oral (11/22 0400) BP: 156/70 mmHg (11/22 0800) Pulse Rate: 77  (11/22 0800) Intake/Output from previous day: 11/21 0701 - 11/22 0700 In: 3739.2 [I.V.:3676.7; IV Piggyback:62.5] Out: 1930 [Urine:1900; Blood:30] Intake/Output from this shift: Total I/O In: 250 [I.V.:250] Out: 35 [Urine:35]  Labs:  [facility] 08/03/12 0430 08/02/12 0437 08/01/12 2211 08/01/12 1803  WBC 9.0 8.3 -- 11.4*  HGB 7.1* 8.3* -- 9.0*  HCT 23.2* 27.1* -- 29.2*  PLT 379 257 -- 359  APTT -- <20* 25 --  INR -- -- 1.13 --     Basename 08/03/12 0430 08/02/12 0437 08/02/12 0401 08/01/12 2211 08/01/12 1803  NA 137 136 -- -- 134*  K 5.1 4.0 -- -- 4.0  CL 105 100 -- -- 96  CO2 21 22 -- -- 23  GLUCOSE 118* 95 -- -- 110*  BUN 36* 44* -- -- 47*  CREATININE 1.92* 2.72* -- -- 3.40*  LABCREA -- -- 128.60 -- --  CREAT24HRUR -- -- -- -- --  CALCIUM 8.6 8.8 -- -- 9.5  MG 1.9 -- -- -- --  PHOS 4.4 -- -- 3.7 --  PROT -- -- -- -- 8.8*  ALBUMIN -- -- -- -- 2.8*  AST -- -- -- -- 29  ALT -- -- -- -- 22  ALKPHOS -- -- -- -- 106  BILITOT -- -- -- -- 0.5  BILIDIR -- -- -- -- --  IBILI -- -- -- -- --  PREALBUMIN -- -- -- -- --  TRIG -- -- -- -- --  CHOLHDL -- -- -- -- --  CHOL -- -- -- -- --   Estimated Creatinine Clearance: 32.7 ml/min (by C-G formula based on Cr of 1.92).   No results found for this basename: GLUCAP:3 in the last 72 hours  Medical History: Past Medical History  Diagnosis Date  . Hypertension   . Renal insufficiency     baseline Cr ~ 1.3  . Hyperlipidemia   . History of DVT (deep vein thrombosis)   .  History of colonic polyps   . Gout   . Cardiomegaly   . CAD (coronary artery disease)     s/p stent placement 04/2005 and s/p PTCA in 04/2008 by Dr. Jonelle Sidle.  . Menorrhagia   . Polyarthritis   . Microcytic anemia   . OSA (obstructive sleep apnea)   . GERD (gastroesophageal reflux disease)   . Esophagitis   . Hidradenitis suppurativa     s/p axillary sweat gland removal  . H/O blood transfusion reaction     at [facility] hospital  . Shortness of breath     OCCASIONAL  . Headache   . Diverticulitis   . Difficulty sleeping   . PMB (postmenopausal bleeding)     X 2 YRS  . Bowel trouble     OCCASIONAL BOWEL INCONTINENCE    Medications:  Scheduled:    . antiseptic oral rinse  15 mL Mouth Rinse q12n4p  . chlorhexidine  15 mL Mouth Rinse BID  . enoxaparin  30 mg Subcutaneous Q24H  . piperacillin-tazobactam (ZOSYN)  IV  3.375 g Intravenous Q8H  . [  DISCONTINUED] piperacillin-tazobactam (ZOSYN)  IV  2.25 g Intravenous Q8H    Insulin Requirements in the past 24 hours:  No insulin orders  Current Nutrition:  NPO  Assessment: 69 YOF s/p total abdominal hysterectomy, Bilateral salpingo oophorectomy, Extensive enterolysis for leiomyoma on 11/12 (d/c'd on 11/17). She represented 11/20 with abd pain, wound drainage and wound dehiscence and likely wound infection. She was noted to be in ARF (on CRI) at time of re-admission. She was taken to OR 11/21 for ex lap and found ECF with placement of wound vac. Orders to start TPN for expected prolonged ileus. PICC to be placed.   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 - If no lytes appropriate, then likely Clinimix 5/15 at 10ml/hr   Plan:   Start Clinimix E5/15 at 15ml/hr tonight (once PICC placed).  Now that NS + KCl stopped, lytes appropriate in TPN as will provide 56meq/24 of K+ (vs 35meq/24h was getting was MIVF)  Adjust MIVF to 85 ml/hr with start of TPN  Add CBGs/sensitive SSI q6h  TNA labs in am  F/u RD recommendations to verify TPN goal rate  Dannielle Huh 08/03/2012,8:28 AM

## 2012-08-03 NOTE — Anesthesia Postprocedure Evaluation (Signed)
Case cancelled - patient transferred to [facility]

## 2012-08-03 NOTE — Progress Notes (Signed)
Patient ID: Marissa [name], female   DOB: 04-Feb-1941, 71 y.o.   MRN: [id number] 1 Day Post-Op  Subjective: Pt reports abd feels better then before surgery but still painful, denies n/v, no problems overnight.  Denies fevers or chills.  Objective: Vital signs in last 24 hours: Temp:  [97.5 F (36.4 C)-98.5 F (36.9 C)] 97.5 F (36.4 C) (11/22 0400) Pulse Rate:  [61-92] 68  (11/22 0700) Resp:  [14-27] 15  (11/22 0700) BP: (109-201)/(60-89) 201/89 mmHg (11/21 2151) SpO2:  [95 %-100 %] 100 % (11/22 0700) Arterial Line BP: (118-199)/(55-88) 149/67 mmHg (11/22 0700) Weight:  [236 lb 8.9 oz (107.3 kg)] 236 lb 8.9 oz (107.3 kg) (11/22 0400) Last BM Date:  (PTA)  Intake/Output from previous day: 11/21 0701 - 11/22 0700 In: 3739.2 [I.V.:3676.7; IV Piggyback:62.5] Out: 1930 [Urine:1900; Blood:30] Intake/Output this shift:    PE: Lungs: mildly diminished in bases, otherwise clear. Abd: mildly distended, tender around incision, no bowel sounds, vac in place with good seal Urine: 1,900 over last 24hrs  Lab Results:   Basename 08/03/12 0430 08/02/12 0437  WBC 9.0 8.3  HGB 7.1* 8.3*  HCT 23.2* 27.1*  PLT 379 257   BMET  Basename 08/03/12 0430 08/02/12 0437  NA 137 136  K 5.1 4.0  CL 105 100  CO2 21 22  GLUCOSE 118* 95  BUN 36* 44*  CREATININE 1.92* 2.72*  CALCIUM 8.6 8.8   PT/INR  Basename 08/01/12 2211  LABPROT 14.3  INR 1.13   CMP     Component Value Date/Time   NA 137 08/03/2012 0430   K 5.1 08/03/2012 0430   CL 105 08/03/2012 0430   CO2 21 08/03/2012 0430   GLUCOSE 118* 08/03/2012 0430   BUN 36* 08/03/2012 0430   CREATININE 1.92* 08/03/2012 0430   CREATININE 1.23* 08/24/2011 1133   CALCIUM 8.6 08/03/2012 0430   PROT 8.8* 08/01/2012 1803   ALBUMIN 2.8* 08/01/2012 1803   AST 29 08/01/2012 1803   ALT 22 08/01/2012 1803   ALKPHOS 106 08/01/2012 1803   BILITOT 0.5 08/01/2012 1803   GFRNONAA 25* 08/03/2012 0430   GFRAA 29* 08/03/2012 0430   Lipase       Component Value Date/Time   LIPASE 11 05/22/2008 1845       Studies/Results: Dg Chest Port 1 View  08/03/2012  *RADIOLOGY REPORT*  Clinical Data: Extubation, atelectasis  PORTABLE CHEST - 1 VIEW  Comparison: 08/02/2012; 07/26/2029; 07/20/2012; chest CT - 12/14/2010  Findings:  Unchanged enlarged cardiac silhouette and mediastinal contours given persistently reduced lung volumes.  Atherosclerotic calcifications within the thoracic aorta.  Interval removal of jugular approach central venous catheter.  No pneumothorax.  Mild pulmonary venous congestion without frank evidence of edema. Grossly unchanged perihilar and bibasilar opacities, left greater than right, likely atelectasis.  No definite pleural effusion. Unchanged bones.  IMPRESSION: Persistently reduced lung volumes with grossly unchanged perihilar and left basilar opacities favored to represent atelectasis. Pulmonary venous congestion without frank evidence of edema. Further evaluation with a PA and lateral chest radiograph may be obtained as clinically indicated.   Original Report Authenticated By: Tacey Ruiz, MD    Dg Chest Portable 1 View  08/02/2012  *RADIOLOGY REPORT*  Clinical Data: Right IJ line placement  PORTABLE CHEST - 1 VIEW  Comparison: 07/26/2012; 07/20/2012  Findings:  Grossly unchanged enlarged cardiac silhouette and mediastinal contours without atherosclerotic calcifications within the aortic arch.  There is unchanged widening of the mediastinum.  Interval placement of a right  jugular approach central venous catheter with tip overlying the superior aspect of the SVC.  No definite pneumothorax.  Lung volumes remain reduced with perihilar and basilar opacity.  No new focal airspace opacities.  Mild elevation of right hemidiaphragm.  No definite pleural effusion.  Unchanged bones.  IMPRESSION: 1.  Right jugular approach central venous catheter with tip overlying the superior aspect of the SVC.  No pneumothorax. 2.  Persistently  reduced lung volumes with perihilar and basilar opacities favored to represent atelectasis.   Original Report Authenticated By: Tacey Ruiz, MD     Anti-infectives: Anti-infectives     Start     Dose/Rate Route Frequency Ordered Stop   08/02/12 2200  piperacillin-tazobactam (ZOSYN) IVPB 3.375 g       3.375 g 12.5 mL/hr over 240 Minutes Intravenous Every 8 hours 08/02/12 1655     08/01/12 2359   piperacillin-tazobactam (ZOSYN) IVPB 2.25 g  Status:  Discontinued        2.25 g 100 mL/hr over 30 Minutes Intravenous 3 times per day 08/01/12 2330 08/02/12 1651           Assessment/Plan 1. POD#1-Exp Lap: doing well this am, urine output good, WBC ok this am, Hgb slightly low 7.1 but asymptomatic.  Expect prolonged NPO.  --PICC line today  --TNA per pharmacy  --Monitor Hgb  --D/c KCl in IV  --Continue zosyn  --lovenox at adjusted dose  --Up out of bed, PT consult  --Keep in step down today  --will need CT of abd once Cr improves to rule out residual abscess.  LOS: 2 days    Genice Kimberlin 08/03/2012

## 2012-08-03 NOTE — Consult Note (Signed)
WOC consult Note Discussed Dr. Fatima Sanger plan for NPWT dressing change tomorrow with ICU Wound Treatment Associate (WTA) RN,  Aliene Beams.  She will obtain 2 pieces of white foam dressing and 1 medium black foam dressing and have at bedside for tomorrow's dressing change. Thanks, Ladona Mow, MSN, RN, [facility], CWOCN [phone])

## 2012-08-03 NOTE — Progress Notes (Addendum)
ANTIBIOTIC CONSULT NOTE - INITIAL  Pharmacy Consult for Zosyn Indication: Abdominal wound infx  Allergies  Allergen Reactions  . Amlodipine Itching    edema    Patient Measurements: Height: 5\' 5"  (165.1 cm) Weight: 236 lb 8.9 oz (107.3 kg) IBW/kg (Calculated) : 57   Vital Signs: Temp: 97 F (36.1 C) (11/22 0800) Temp src: Oral (11/22 0800) BP: 156/70 mmHg (11/22 0800) Pulse Rate: 77  (11/22 0800) Intake/Output from previous day: 11/21 0701 - 11/22 0700 In: 3739.2 [I.V.:3676.7; IV Piggyback:62.5] Out: 1930 [Urine:1900; Blood:30] Intake/Output from this shift: Total I/O In: 250 [I.V.:250] Out: 35 [Urine:35]  Labs:  [facility] 08/03/12 0430 08/02/12 0437 08/02/12 0401 08/01/12 1803  WBC 9.0 8.3 -- 11.4*  HGB 7.1* 8.3* -- 9.0*  PLT 379 257 -- 359  LABCREA -- -- 128.60 --  CREATININE 1.92* 2.72* -- 3.40*   Estimated Creatinine Clearance: 32.7 ml/min (by C-G formula based on Cr of 1.92).    Microbiology: Recent Results (from the past 720 hour(s))  SURGICAL PCR SCREEN     Status: Normal   Collection Time   07/20/12 11:35 AM      Component Value Range Status Comment   MRSA, PCR NEGATIVE  NEGATIVE Final    Staphylococcus aureus NEGATIVE  NEGATIVE Final   SURGICAL PCR SCREEN     Status: Normal   Collection Time   08/02/12 10:31 AM      Component Value Range Status Comment   MRSA, PCR NEGATIVE  NEGATIVE Final    Staphylococcus aureus NEGATIVE  NEGATIVE Final     Medical History: Past Medical History  Diagnosis Date  . Hypertension   . Renal insufficiency     baseline Cr ~ 1.3  . Hyperlipidemia   . History of DVT (deep vein thrombosis)   . History of colonic polyps   . Gout   . Cardiomegaly   . CAD (coronary artery disease)     s/p stent placement 04/2005 and s/p PTCA in 04/2008 by Dr. Jonelle Sidle.  . Menorrhagia   . Polyarthritis   . Microcytic anemia   . OSA (obstructive sleep apnea)   . GERD (gastroesophageal reflux disease)   . Esophagitis   . Hidradenitis  suppurativa     s/p axillary sweat gland removal  . H/O blood transfusion reaction     at [facility] hospital  . Shortness of breath     OCCASIONAL  . Headache   . Diverticulitis   . Difficulty sleeping   . PMB (postmenopausal bleeding)     X 2 YRS  . Bowel trouble     OCCASIONAL BOWEL INCONTINENCE    Medications:  Anti-infectives     Start     Dose/Rate Route Frequency Ordered Stop   08/02/12 2200  piperacillin-tazobactam (ZOSYN) IVPB 3.375 g       3.375 g 12.5 mL/hr over 240 Minutes Intravenous Every 8 hours 08/02/12 1655     08/01/12 2359   piperacillin-tazobactam (ZOSYN) IVPB 2.25 g  Status:  Discontinued        2.25 g 100 mL/hr over 30 Minutes Intravenous 3 times per day 08/01/12 2330 08/02/12 1651         Assessment: 71 YOF s/p total abdominal hysterectomy, Bilateral salpingo oophorectomy, Extensive enterolysis for leiomyoma on 11/12 (d/c'd on 11/17). She represented 11/20 with abd pain, wound drainage and wound dehiscence and likely wound infection  11/21 >> Zosyn 11/20 >> gent once  Tmax: afeb WBCs: wnl Renal: Scr 1.92, crcl 32  11/21 urine:  in process  Goal of Therapy:  Appropriate dose of zosyn Resolution of infxn  Plan:   Continue Zosyn extended infusion 3.375g IV q8h, each dose over 4 hours  Monitor Scr/CrCl  Adjust dose as appropriate  Juliette Alcide, PharmD, BCPS.   Pager: [phone] 08/03/2012 9:38 AM

## 2012-08-03 NOTE — Progress Notes (Signed)
Agree with above.  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  Clinically improving.  When renal function improved, CT abd/pelvis to rule out other sources of intra-abdominal infection.  Clinically, she seems to be improving.  Wilmon Arms. Corliss Skains, MD, [facility]  08/03/2012 11:03 AM

## 2012-08-03 NOTE — Progress Notes (Signed)
PULMONARY  / CRITICAL CARE MEDICINE  Name: Marissa [name] MRN: [id number] DOB: 02/16/1941    LOS: 2  REFERRING MD : Corliss Skains  CHIEF COMPLAINT: Post op resp failure, renal failure  BRIEF PATIENT DESCRIPTION: 71 yo post TAH 11-12 per Dr. Nelly Rout for post menopausal bleeding. 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  LINES / TUBES:  11-21 rt i j cvl>> 11-21 a line>>  CULTURES: MRSA 11/21 > neg Urine 11/21 >>>   ANTIBIOTICS: 11-21 zoysn>>  SIGNIFICANT EVENTS:  11-21 to ORAbdominal wound dehiscence with enterocutaneous fistula And abd wound vac placement   LEVEL OF CARE:  ICU PRIMARY SERVICE:  Surgery CONSULTANTS:  [facility], [facility], P[facility]M CODE STATUS: Full DIET:  NPO DVT Px:  PAS GI Px:  PPI  HISTORY OF PRESENT ILLNESS:   71 yo post TAH 11-12 per Dr. Nelly Rout for post menopausal bleeding. She returned to hospital 11-20 with open wound and presumed infection and went to the OR 11-21. She was further notable for renal insuffiencey.Creatine 1.74 11-15 and 3.4 on 11-20 and 2.72 on 11-21 treated with ivf and stopping diuretics. General surgery was consulted and she for exploratory laparotomy due to suspected  stool leaking from surgical wound. PCCM may be consulted 11-21.     INTERVAL HISTORY:  NAD post surgery 11-21  VITAL SIGNS: Temp:  [97 F (36.1 C)-98.5 F (36.9 C)] 97 F (36.1 C) (11/22 0800) Pulse Rate:  [61-92] 77  (11/22 0800) Resp:  [14-27] 18  (11/22 0800) BP: (131-201)/(60-89) 156/70 mmHg (11/22 0800) SpO2:  [95 %-100 %] 100 % (11/22 0800) Arterial Line BP: (118-199)/(55-88) 167/74 mmHg (11/22 0800) Weight:  [107.3 kg (236 lb 8.9 oz)] 107.3 kg (236 lb 8.9 oz) (11/22 0400) FIO2  RA sats 96%  HEMODYNAMICS:   VENTILATOR SETTINGS:   INTAKE /  OUTPUT: Intake/Output      11/21 0701 - 11/22 0700 11/22 0701 - 11/23 0700   I.V. (mL/kg) 3676.7 (34.3) 500 (4.7)   IV Piggyback 75 25   Total Intake(mL/kg) 3751.7 (35) 525 (4.9)   Urine (mL/kg/hr) 1900 (0.7) 170   Blood 30    Total Output 1930 170   Net +1821.7 +355             PHYSICAL EXAMINATION: General:  WNWD AAF  Neuro:  Intact HEENT:  NO LAN Cardiovascular:  HSR RRR Lungs:  Diminished bs thruout Abdomen:  Abd dressing intact. Wound vac.  Musculoskeletal:  intact Skin:  warm   LABS: cbc  Lab 08/03/12 0430 08/02/12 0437 08/01/12 1803  WBC 9.0 8.3 11.4*  HGB 7.1* 8.3* 9.0*  HCT 23.2* 27.1* 29.2*   chemistry  Lab 08/03/12 0430 08/02/12 0437 08/01/12 2211 08/01/12 1803  NA 137 136 -- 134*  K 5.1 4.0 -- 4.0  CO2 21 22 -- 23  GLUCOSE 118* 95 -- 110*  BUN 36* 44* -- 47*  CREATININE 1.92* 2.72* -- 3.40*  CALCIUM 8.6 8.8 -- 9.5  MG 1.9 -- -- --  PHOS 4.4 -- 3.7 --   Liver fxn  Lab 08/01/12 1803  AST 29  ALT 22  ALKPHOS 106  BILITOT 0.5  PROT 8.8*  ALBUMIN 2.8*   coags  Lab 08/02/12 0437 08/01/12 2211  APTT <20* 25  INR -- 1.13   Sepsis markers  No results found for this basename: LATICACIDVEN:3,PROCALCITON:3 in the last 168 hours Cardiac markers No results found for this basename: CKTOTAL:3,CKMB:3,TROPONINI:3 in the last 168 hours BNP No results found for this basename: PROBNP:3 in the last 168 hours ABG No results found for this basename: PHART:3,PCO2ART:3,PO2ART:3,HCO3:3,TCO2:3 in the last 168 hours  CBG trend No results found for this basename: GLUCAP:5 in the last 168 hours  IMAGING: Dg Chest Port 1 View  08/03/2012  *RADIOLOGY REPORT*  Clinical Data: Extubation, atelectasis  PORTABLE CHEST - 1 VIEW  Comparison: 08/02/2012; 07/26/2029; 07/20/2012; chest CT - 12/14/2010  Findings:  Unchanged enlarged cardiac silhouette and mediastinal contours given persistently reduced lung volumes.  Atherosclerotic calcifications within the thoracic  aorta.  Interval removal of jugular approach central venous catheter.  No pneumothorax.  Mild pulmonary venous congestion without frank evidence of edema. Grossly unchanged perihilar and bibasilar opacities, left greater than right, likely atelectasis.  No definite pleural effusion. Unchanged bones.  IMPRESSION: Persistently reduced lung volumes with grossly unchanged perihilar and left basilar opacities favored to represent atelectasis. Pulmonary venous congestion without frank evidence of edema. Further evaluation with a PA and lateral chest radiograph may be obtained as clinically indicated.   Original Report Authenticated By: Tacey Ruiz, MD    Dg Chest Portable 1 View  08/02/2012  *RADIOLOGY REPORT*  Clinical Data: Right IJ line placement  PORTABLE CHEST - 1 VIEW  Comparison: 07/26/2012; 07/20/2012  Findings:  Grossly unchanged enlarged cardiac silhouette and mediastinal contours without atherosclerotic calcifications within the aortic arch.  There is unchanged widening of the mediastinum.  Interval placement of a right jugular approach central venous catheter with tip overlying the superior aspect of the SVC.  No definite pneumothorax.  Lung volumes remain reduced with perihilar and basilar opacity.  No new focal airspace opacities.  Mild elevation of right hemidiaphragm.  No definite pleural effusion.  Unchanged bones.  IMPRESSION: 1.  Right jugular approach central venous catheter with tip overlying the superior aspect of the SVC.  No pneumothorax. 2.  Persistently reduced lung volumes with perihilar and basilar opacities favored to represent atelectasis.   Original Report Authenticated By: Tacey Ruiz, MD        DIAGNOSES: Principal Problem:  *Wound dehiscence, surgical Active Problems:  Acute renal failure  Acute kidney injury  Acute respiratory failure   ASSESSMENT / PLAN:  PULMONARY  ASSESSMENT: 02 dep resp failure No acute issue PLAN:   Pulmonary toilet/ supplemental 02 to  maintain sats > 94%  CARDIOVASCULAR  ASSESSMENT:  Hx of HTN PLAN:  Hold all anthypertensives  RENAL Lab Results  Component Value Date   CREATININE 1.92* 08/03/2012   CREATININE 2.72* 08/02/2012   CREATININE 3.40* 08/01/2012   CREATININE 1.23* 08/24/2011     ASSESSMENT:   Renal Insuff acute on chronic on arb preop>>>improved 11/22 PLAN:   DC antihypertensives and nsaids  Hydration Renal US in future if creatine worsens Serial cvp's   GASTROINTESTINAL  ASSESSMENT:   Suspected bowel compromise. Post op 11-21 . Small bowel with small hole. Will require follow up surgery.  PLAN:   Per [facility]   HEMATOLOGIC  Basename 08/03/12 0430 08/02/12 0437  HGB 7.1* 8.3*   ASSESSMENT:   Anemia with surgery pending(hx folate def) Hx of DVT PLAN:  Transfuse as needed for Hgb < 7.0 PAS or Heparin post op  INFECTIOUS  ASSESSMENT:   Open wound with enterocutaneous fistula PLAN:   Per flow sheet, zosyn adequate gi coverage    ENDOCRINE  ASSESSMENT:  No acute issue   PLAN:   Monitor  NEUROLOGIC  ASSESSMENT:  No acute issue   PLAN:   monitor   The patient is critically ill with multiple organ systems failure and requires high complexity decision making for assessment and support, frequent evaluation and titration of therapies, application of advanced monitoring technologies and extensive interpretation of multiple databases. Critical Care Time devoted to patient care services described in this note is (   30 )minutes.     Dorcas Carrow Beeper  [phone]  Cell  [phone]  If no response or cell goes to voicemail, call beeper [phone]

## 2012-08-03 NOTE — Progress Notes (Signed)
1 Day Post-Op Procedure(s) (LRB): EXPLORATORY LAPAROTOMY (N/A)  Subjective: Patient reports intermittent abdominal soreness.  Falling sleep during conversation.     Objective: Vital signs in last 24 hours: Temp:  [97 F (36.1 C)-98.5 F (36.9 C)] 97 F (36.1 C) (11/22 0800) Pulse Rate:  [61-92] 77  (11/22 0800) Resp:  [14-27] 18  (11/22 0800) BP: (131-201)/(60-89) 156/70 mmHg (11/22 0800) SpO2:  [95 %-100 %] 100 % (11/22 0800) Arterial Line BP: (118-199)/(55-88) 167/74 mmHg (11/22 0800) Weight:  [236 lb 8.9 oz (107.3 kg)] 236 lb 8.9 oz (107.3 kg) (11/22 0400) Last BM Date:  (PTA)  Intake/Output from previous day: 11/21 0701 - 11/22 0700 In: 3751.7 [I.V.:3676.7; IV Piggyback:75] Out: 1930 [Urine:1900; Blood:30]  Physical Examination: General: Sleeping, opening eyes to verbal stimuli Resp: mildly diminished in the bases Cardio: regular rate and rhythm, S1, S2 normal, no murmur, click, rub or gallop GI: abnormal findings:  absent bowel sounds, obese and mildly distended, tender with palpation and incision: VAC in place, 100 cc output in the canister Extremities: extremities normal, atraumatic, no cyanosis or edema  Labs: WBC/Hgb/Hct/Plts:  9.0/7.1/23.2/379 (11/22 0430) BUN/Cr/glu/ALT/AST/amyl/lip:  36/1.92/--/--/--/--/-- (11/22 0430)  Assessment: 71 y.o. s/p Procedure(s): EXPLORATORY LAPAROTOMY: stable Pain:  Morphine IV ordered PRN for pain.  Heme:  Hemoglobin 7.1 and Hct 23.2 this am from Hgb 8.3 and Hct 27.1 on 08/02/12.  Patient asymptomatic.  ID:  Current treatment: IV Zosyn.  WBC 9.0 this am.    CV:  BP elevated at times.  History of hypertension and CAD.  Clonidine ordered PRN.  GI:  Tolerating po: No: NPO.  Wound VAC in place.     GU:  Creatinine 1.92 this am from 2.72 on 08/02/12.  History of renal insufficiency.  Urine output improving.  Prophylaxis:  Lovenox ordered.  PAS on.  Plan: Continue plan of care per General Surgery   Begin TPN   Continue IV  Zosyn   PT to work with patient   LOS: 2 days    Marissa [name] DEAL 08/03/2012, 10:29 AM

## 2012-08-03 NOTE — Progress Notes (Signed)
Peripherally Inserted Central Catheter/Midline Placement  The IV Nurse has discussed with the patient and/or persons authorized to consent for the patient, the purpose of this procedure and the potential benefits and risks involved with this procedure.  The benefits include less needle sticks, lab draws from the catheter and patient may be discharged home with the catheter.  Risks include, but not limited to, infection, bleeding, blood clot (thrombus formation), and puncture of an artery; nerve damage and irregular heat beat.  Alternatives to this procedure were also discussed.  PICC/Midline Placement Documentation        Marissa [name] 08/03/2012, 4:54 PM

## 2012-08-03 NOTE — Progress Notes (Signed)
CARE MANAGEMENT NOTE 08/03/2012  Patient:  Marissa [name], Marissa [name]   Account Number:  [id number]  Date Initiated:  08/02/2012  Documentation initiated by:  Lorenda Ishihara  Subjective/Objective Assessment:   71 yo female admitted with wound dehisence, acute kidney injury with elevated creatinine. PTA lived at home with dtr.     Action/Plan:   transfer to ICU post op   Anticipated DC Date:  08/06/2012   Anticipated DC Plan:  HOME W HOME HEALTH SERVICES      DC Planning Services  CM consult      Choice offered to / List presented to:             Status of service:  In process, will continue to follow Medicare Important Message given?   (If response is "NO", the following Medicare IM given date fields will be blank) Date Medicare IM given:   Date Additional Medicare IM given:    Discharge Disposition:    Per UR Regulation:  Reviewed for med. necessity/level of care/duration of stay  If discussed at Long Length of Stay Meetings, dates discussed:    Comments:  [id number]/Daanya Lanphier Earlene Plater, RN, BSN, CCM: CHART REVIEWED AND UPDATED.  Next chart review due on [id number]. NO DISCHARGE NEEDS PRESENT AT THIS TIME. CASE MANAGEMENT [phone]

## 2012-08-04 ENCOUNTER — Inpatient Hospital Stay (HOSPITAL_COMMUNITY): Payer: Medicare Other

## 2012-08-04 DIAGNOSIS — G4733 Obstructive sleep apnea (adult) (pediatric): Secondary | ICD-10-CM

## 2012-08-04 LAB — DIFFERENTIAL
Basophils Relative: 0 % (ref 0–1)
Eosinophils Relative: 1 % (ref 0–5)
Lymphocytes Relative: 11 % — ABNORMAL LOW (ref 12–46)
Neutrophils Relative %: 78 % — ABNORMAL HIGH (ref 43–77)

## 2012-08-04 LAB — COMPREHENSIVE METABOLIC PANEL
ALT: 14 U/L (ref 0–35)
AST: 20 U/L (ref 0–37)
Alkaline Phosphatase: 68 U/L (ref 39–117)
CO2: 25 mEq/L (ref 19–32)
Chloride: 107 mEq/L (ref 96–112)
GFR calc non Af Amer: 29 mL/min — ABNORMAL LOW (ref >90)
Potassium: 4.1 mEq/L (ref 3.5–5.1)
Sodium: 139 mEq/L (ref 135–145)
Total Bilirubin: 0.2 mg/dL — ABNORMAL LOW (ref 0.3–1.2)

## 2012-08-04 LAB — GLUCOSE, CAPILLARY
Glucose-Capillary: 145 mg/dL — ABNORMAL HIGH (ref 70–99)
Glucose-Capillary: 121 mg/dL — ABNORMAL HIGH (ref 70–99)

## 2012-08-04 LAB — CBC
MCV: 66.5 fL — ABNORMAL LOW (ref 78.0–100.0)
Platelets: 358 10*3/uL (ref 150–400)
RBC: 3.25 MIL/uL — ABNORMAL LOW (ref 3.87–5.11)
WBC: 8.7 10*3/uL (ref 4.0–10.5)

## 2012-08-04 LAB — URINE CULTURE

## 2012-08-04 MED ORDER — LACTATED RINGERS IV SOLN: INTRAVENOUS | Status: DC

## 2012-08-04 MED ORDER — FUROSEMIDE 10 MG/ML IJ SOLN
40.0000 mg | Freq: Once | INTRAMUSCULAR | Status: AC
  Administered 2012-08-04: 40 mg via INTRAVENOUS
  Filled 2012-08-04: qty 4

## 2012-08-04 MED ORDER — CLINIMIX E/DEXTROSE (5/20) 5 % IV SOLN
INTRAVENOUS | Status: AC
  Administered 2012-08-04: 17:00:00 via INTRAVENOUS
  Filled 2012-08-04: qty 2000

## 2012-08-04 NOTE — Progress Notes (Signed)
Patient ID: Marissa [name], female   DOB: 07/27/1941, 71 y.o.   MRN: [id number] 2 Days Post-Op  Subjective: Tired, some abdominal pain  Objective: Vital signs in last 24 hours: Temp:  [97.1 F (36.2 C)-98 F (36.7 C)] 98 F (36.7 C) (11/23 0800) Pulse Rate:  [70-82] 79  (11/23 0100) Resp:  [15-18] 17  (11/23 0100) BP: (117-157)/(48-80) 117/53 mmHg (11/23 0400) SpO2:  [99 %-100 %] 99 % (11/23 0100) Last BM Date:  (PTA)  Intake/Output from previous day: 11/22 0701 - 11/23 0700 In: 3280 [I.V.:2480; IV Piggyback:150; TPN:650] Out: 1470 [Urine:1270; Drains:200] Intake/Output this shift:    General appearance: fatigued and no distress GI: normal findings: Generally non tender Incision/Wound: VAC changed with white and black foam.  Wound generally clean.  minimal necrotic tissue at base.  Slight bile staining on white foam but no obious enteric leak  Lab Results:   Basename 08/04/12 0558 08/03/12 0430  WBC 8.7 9.0  HGB 6.7* 7.1*  HCT 21.6* 23.2*  PLT 358 379   BMET  Basename 08/04/12 0558 08/03/12 0430  NA 139 137  K 4.1 5.1  CL 107 105  CO2 25 21  GLUCOSE 145* 118*  BUN 36* 36*  CREATININE 1.72* 1.92*  CALCIUM 8.6 8.6     Studies/Results: Dg Chest Port 1 View  08/03/2012  *RADIOLOGY REPORT*  Clinical Data: Extubation, atelectasis  PORTABLE CHEST - 1 VIEW  Comparison: 08/02/2012; 07/26/2029; 07/20/2012; chest CT - 12/14/2010  Findings:  Unchanged enlarged cardiac silhouette and mediastinal contours given persistently reduced lung volumes.  Atherosclerotic calcifications within the thoracic aorta.  Interval removal of jugular approach central venous catheter.  No pneumothorax.  Mild pulmonary venous congestion without frank evidence of edema. Grossly unchanged perihilar and bibasilar opacities, left greater than right, likely atelectasis.  No definite pleural effusion. Unchanged bones.  IMPRESSION: Persistently reduced lung volumes with grossly unchanged perihilar and  left basilar opacities favored to represent atelectasis. Pulmonary venous congestion without frank evidence of edema. Further evaluation with a PA and lateral chest radiograph may be obtained as clinically indicated.   Original Report Authenticated By: Tacey Ruiz, MD    Dg Chest Portable 1 View  08/02/2012  *RADIOLOGY REPORT*  Clinical Data: Right IJ line placement  PORTABLE CHEST - 1 VIEW  Comparison: 07/26/2012; 07/20/2012  Findings:  Grossly unchanged enlarged cardiac silhouette and mediastinal contours without atherosclerotic calcifications within the aortic arch.  There is unchanged widening of the mediastinum.  Interval placement of a right jugular approach central venous catheter with tip overlying the superior aspect of the SVC.  No definite pneumothorax.  Lung volumes remain reduced with perihilar and basilar opacity.  No new focal airspace opacities.  Mild elevation of right hemidiaphragm.  No definite pleural effusion.  Unchanged bones.  IMPRESSION: 1.  Right jugular approach central venous catheter with tip overlying the superior aspect of the SVC.  No pneumothorax. 2.  Persistently reduced lung volumes with perihilar and basilar opacities favored to represent atelectasis.   Original Report Authenticated By: Tacey Ruiz, MD     Anti-infectives: Anti-infectives     Start     Dose/Rate Route Frequency Ordered Stop   08/02/12 2200  piperacillin-tazobactam (ZOSYN) IVPB 3.375 g       3.375 g 12.5 mL/hr over 240 Minutes Intravenous Every 8 hours 08/02/12 1655     08/01/12 2359   piperacillin-tazobactam (ZOSYN) IVPB 2.25 g  Status:  Discontinued        2.25 g  100 mL/hr over 30 Minutes Intravenous 3 times per day 08/01/12 2330 08/02/12 1651          Assessment/Plan: s/p Procedure(s): EXPLORATORY LAPAROTOMY Open wound with VAC Probable enteric fistula but minimal volume Continue abx, NPO, TNA, wound care   LOS: 3 days    Darius Fillingim T 08/04/2012

## 2012-08-04 NOTE — Evaluation (Signed)
Physical Therapy Evaluation Patient Details Name: Marissa [name] MRN: [id number] DOB: Dec 01, 1940 Today's Date: 08/04/2012 Time: 4098-1191 PT Time Calculation (min): 23 min  PT Assessment / Plan / Recommendation Clinical Impression  Pt presents s/p hysterectomy and oophrectomy with wound dehiscence and acute kidney injury.  She now has wound vac in place for wound healing.  Tolerated OOB and ambulation in hallway very well with RW and no noted LOB.  Pt will benefit from skilled PT in acute venue to address deficits.  PT recommends HHPT for follow up at D/C to maximize pts functional independence.     PT Assessment  Patient needs continued PT services    Follow Up Recommendations  Home health PT;Supervision for mobility/OOB    Does the patient have the potential to tolerate intense rehabilitation      Barriers to Discharge None      Equipment Recommendations  None recommended by PT    Recommendations for Other Services     Frequency Min 3X/week    Precautions / Restrictions Precautions Precautions: Fall Precaution Comments: pt has wound vac on abdomen  Restrictions Weight Bearing Restrictions: No   Pertinent Vitals/Pain Pt with pain in abdomen with getting out of bed as well as some pain in head.       Mobility  Bed Mobility Bed Mobility: Rolling Right;Right Sidelying to Sit;Sitting - Scoot to Edge of Bed Rolling Right: 4: Min assist;With rail Right Sidelying to Sit: 1: +2 Total assist;HOB elevated;With rails Right Sidelying to Sit: Patient Percentage: 60% Sitting - Scoot to Edge of Bed: 5: Supervision Details for Bed Mobility Assistance: Pt requires some assist for trunk when rolling and when sitting up to EOB.  cues for log rolling to prevent twisting of abdomen, however pt states that this caused increased pain for her.   Transfers Transfers: Sit to Stand;Stand to Sit Sit to Stand: With upper extremity assist;From elevated surface;From bed;4: Min assist Stand to  Sit: With upper extremity assist;With armrests;To chair/3-in-1;4: Min assist Details for Transfer Assistance: Assist to rise and steady with cues for hand placement, safety and technique when sitting/standing.  Ambulation/Gait Ambulation/Gait Assistance: 1: +2 Total assist Ambulation/Gait: Patient Percentage: 80% Ambulation Distance (Feet): 140 Feet Assistive device: Rolling walker Ambulation/Gait Assistance Details: Min cues for upright posture and RW positioning with turns.  Pt very steady with no noted LOB.  Gait Pattern: Step-to pattern;Decreased stride length Gait velocity: decreased Stairs: No Wheelchair Mobility Wheelchair Mobility: No    Shoulder Instructions     Exercises     PT Diagnosis: Difficulty walking;Generalized weakness;Acute pain  PT Problem List: Decreased strength;Decreased activity tolerance;Decreased mobility;Decreased knowledge of use of DME;Pain PT Treatment Interventions: DME instruction;Gait training;Stair training;Functional mobility training;Therapeutic activities;Therapeutic exercise;Balance training;Patient/family education   PT Goals Acute Rehab PT Goals PT Goal Formulation: With patient Time For Goal Achievement: 08/18/12 Potential to Achieve Goals: Good Pt will go Supine/Side to Sit: with supervision PT Goal: Supine/Side to Sit - Progress: Goal set today Pt will go Sit to Supine/Side: with supervision PT Goal: Sit to Supine/Side - Progress: Goal set today Pt will go Sit to Stand: with modified independence PT Goal: Sit to Stand - Progress: Goal set today Pt will Ambulate: 51 - 150 feet;with supervision;with least restrictive assistive device PT Goal: Ambulate - Progress: Goal set today Pt will Go Up / Down Stairs: 1-2 stairs;with supervision;with least restrictive assistive device PT Goal: Up/Down Stairs - Progress: Goal set today  Visit Information  Last PT Received On: 08/04/12 Assistance Needed: +  1    Subjective Data  Subjective: I do  alot of walking mentally.  Patient Stated Goal: to be able to use restroom.    Prior Functioning  Home Living Lives With: Daughter;Son Available Help at Discharge: Available 24 hours/day Type of Home: House Home Access: Stairs to enter [employer] of Steps: 1 Entrance Stairs-Rails: None Home Layout: One level Bathroom Shower/Tub: Health visitor: Standard Home Adaptive Equipment: Walker - rolling;Bedside commode/3-in-1;Straight cane;Built-in shower seat Prior Function Level of Independence: Independent Able to Take Stairs?: Yes Driving: No Vocation: Retired Musician: No difficulties    Cognition  Overall Cognitive Status: Appears within functional limits for tasks assessed/performed Arousal/Alertness: Awake/alert Orientation Level: Appears intact for tasks assessed Behavior During Session: [facility] for tasks performed    Extremity/Trunk Assessment Right Lower Extremity Assessment RLE ROM/Strength/Tone: WFL for tasks assessed RLE Sensation: WFL - Light Touch Left Lower Extremity Assessment LLE ROM/Strength/Tone: WFL for tasks assessed LLE Sensation: WFL - Light Touch Trunk Assessment Trunk Assessment: Kyphotic   Balance    End of Session PT - End of Session Activity Tolerance: Patient limited by pain Patient left: in chair;with call bell/phone within reach Nurse Communication: Mobility status  GP     Page, Meribeth Mattes 08/04/2012, 9:42 AM

## 2012-08-04 NOTE — Progress Notes (Signed)
PARENTERAL NUTRITION CONSULT NOTE - INITIAL  Pharmacy Consult for TPN Indication: Expected prolong ileus  Allergies  Allergen Reactions  . Amlodipine Itching    edema    Patient Measurements: Height: 5\' 5"  (165.1 cm) Weight: 236 lb 8.9 oz (107.3 kg) IBW/kg (Calculated) : 57  Adjusted Body Weight: 72 Usual Weight: 100 kg  Vital Signs: Temp: 97.5 F (36.4 C) (11/23 0400) Temp src: Axillary (11/23 0400) BP: 117/53 mmHg (11/23 0400) Pulse Rate: 79  (11/23 0100) Intake/Output from previous day: 11/22 0701 - 11/23 0700 In: 2592.5 [I.V.:2055; IV Piggyback:137.5; TPN:400] Out: 895 [Urine:695; Drains:200] Intake/Output from this shift: Total I/O In: 872.5 [I.V.:510; IV Piggyback:62.5; TPN:300] Out: 200 [Urine:200]  Labs:  [facility] 08/03/12 0430 08/02/12 0437 08/01/12 2211 08/01/12 1803  WBC 9.0 8.3 -- 11.4*  HGB 7.1* 8.3* -- 9.0*  HCT 23.2* 27.1* -- 29.2*  PLT 379 257 -- 359  APTT -- <20* 25 --  INR -- -- 1.13 --     Basename 08/03/12 0430 08/02/12 0437 08/02/12 0401 08/01/12 2211 08/01/12 1803  NA 137 136 -- -- 134*  K 5.1 4.0 -- -- 4.0  CL 105 100 -- -- 96  CO2 21 22 -- -- 23  GLUCOSE 118* 95 -- -- 110*  BUN 36* 44* -- -- 47*  CREATININE 1.92* 2.72* -- -- 3.40*  LABCREA -- -- 128.60 -- --  CREAT24HRUR -- -- -- -- --  CALCIUM 8.6 8.8 -- -- 9.5  MG 1.9 -- -- -- --  PHOS 4.4 -- -- 3.7 --  PROT -- -- -- -- 8.8*  ALBUMIN -- -- -- -- 2.8*  AST -- -- -- -- 29  ALT -- -- -- -- 22  ALKPHOS -- -- -- -- 106  BILITOT -- -- -- -- 0.5  BILIDIR -- -- -- -- --  IBILI -- -- -- -- --  PREALBUMIN -- -- -- -- --  TRIG -- -- -- -- --  CHOLHDL -- -- -- -- --  CHOL -- -- -- -- --   Estimated Creatinine Clearance: 32.7 ml/min (by C-G formula based on Cr of 1.92).    Basename 08/03/12 2348 08/03/12 1812  GLUCAP 123* 105*    Medical History: Past Medical History  Diagnosis Date  . Hypertension   . Renal insufficiency     baseline Cr ~ 1.3  . Hyperlipidemia   .  History of DVT (deep vein thrombosis)   . History of colonic polyps   . Gout   . Cardiomegaly   . CAD (coronary artery disease)     s/p stent placement 04/2005 and s/p PTCA in 04/2008 by Dr. Jonelle Sidle.  . Menorrhagia   . Polyarthritis   . Microcytic anemia   . OSA (obstructive sleep apnea)   . GERD (gastroesophageal reflux disease)   . Esophagitis   . Hidradenitis suppurativa     s/p axillary sweat gland removal  . H/O blood transfusion reaction     at [facility] hospital  . Shortness of breath     OCCASIONAL  . Headache   . Diverticulitis   . Difficulty sleeping   . PMB (postmenopausal bleeding)     X 2 YRS  . Bowel trouble     OCCASIONAL BOWEL INCONTINENCE    Medications:  Scheduled:     . antiseptic oral rinse  15 mL Mouth Rinse q12n4p  . chlorhexidine  15 mL Mouth Rinse BID  . enoxaparin  30 mg Subcutaneous Q24H  . insulin aspart  0-9  Units Subcutaneous Q6H  . piperacillin-tazobactam (ZOSYN)  IV  3.375 g Intravenous Q8H  . sodium chloride  10-40 mL Intracatheter Q12H    Insulin Requirements in the past 24 hours:  No insulin orders  Current Nutrition:  NPO  Assessment: 11 YOF s/p total abdominal hysterectomy, Bilateral salpingo oophorectomy, Extensive enterolysis for leiomyoma on 11/12 (d/c'd on 11/17). She represented 11/20 with abd pain, wound drainage and wound dehiscence and likely wound infection. She was noted to be in ARF (on CRI) at time of re-admission. She was taken to OR 11/21 for ex lap and found ECF with placement of wound vac. Orders to start TPN for expected prolonged ileus. TNA started 11/22   Labs: Electrolytes: K wnl today, phos decreased but WNL.  Other electrolytes wnl Renal: SCr = improved to 1.72 (appears baseline ~1.2), good UOP LFTs: WNL on 11/20 TG/Chol: None available, check 11/23 Pre-Albumin: None available, check 11/23 - pending  CBGs: at goal < 150;  no h/o DM IV fluid: LR @ 85 ml/hr   Nutritional Goals:  RD assessment 11/22: 1478-2956  kCal, 80-100 grams of protein per day  Clinimix E5/20 at goal 11ml/hr will provide 84gm of protein and 1958 Kcal on MWF with lipids and 1478 KCals w/o lipids for avg 1684 kcal/day.  Plan:   Increase Clinimix E 5/20 to 60 ml/hr tonight at 1800.    Decrease MIVF to 65 ml/hr with increase in TNA rate tonight   Continue CBGs/sensitive SSI q6h  TNA labs on Monday's and Thursdays, will repeat BMET, Mg, Phos in AM  Follow up pre-albumin, pending for today  Lianni Kanaan, Loma Messing PharmD Pager #: [phone] 7:33 AM 08/04/2012

## 2012-08-04 NOTE — Progress Notes (Signed)
Patient ID: Marissa [name], female   DOB: [birth year], 71 y.o.   MRN: [id number] 2 Days Post-Op Procedure(s) (LRB): EXPLORATORY LAPAROTOMY (N/A)  Subjective: Patient sitting up in chair, comfortable  Objective: Vital signs in last 24 hours: Temp:  [97.1 F (36.2 C)-98 F (36.7 C)] 98 F (36.7 C) (11/23 0800) Pulse Rate:  [70-82] 79  (11/23 0100) Resp:  [15-18] 17  (11/23 0100) BP: (117-157)/(48-80) 117/53 mmHg (11/23 0400) SpO2:  [99 %-100 %] 99 % (11/23 0100) Last BM Date:  (PTA)  Intake/Output from previous day: 11/22 0701 - 11/23 0700 In: 3280 [I.V.:2480; IV Piggyback:150; TPN:650] Out: 1470 [Urine:1270; Drains:200]  Physical Examination: Abd: vac in place  Labs: WBC/Hgb/Hct/Plts:  8.7/6.7/21.6/358 (11/23 2956) BUN/Cr/glu/ALT/AST/amyl/lip:  36/1.72/--/14/20/--/-- (11/23 2130)  Assessment: 71 y.o. 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  ID:  Current treatment: IV Zosyn.      Renal: ARF; renal function continues to improve  Plan: Continue plan of care per General Surgery/Critical Care     LOS: 3 days    JACKSON-MOORE,Artice Bergerson A 08/04/2012, 9:31 AM

## 2012-08-04 NOTE — Progress Notes (Signed)
Pt refused CPAP for the night, RT to monitor and assess as needed.  

## 2012-08-04 NOTE — Progress Notes (Signed)
PULMONARY  / CRITICAL CARE MEDICINE  Name: Marissa [name] MRN: [id number] DOB: 1941/05/30    LOS: 3  REFERRING MD : Corliss Skains  CHIEF COMPLAINT: Post op resp failure, renal failure  BRIEF PATIENT DESCRIPTION: 71 yo post TAH 11-12 per Dr. Nelly Rout for post menopausal bleeding. 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  LINES / TUBES:  11-21 rt i j cvl>>d/c  11-21 a line>>d/c 11/22 RUE PICC 3L>>>  CULTURES: MRSA 11/21 > neg Urine 11/21 >>>e coli 25K   ANTIBIOTICS: 11-21 zoysn>>  SIGNIFICANT EVENTS:  11-21 to ORAbdominal wound dehiscence with enterocutaneous fistula And abd wound vac placement   LEVEL OF CARE:  ICU PRIMARY SERVICE:  Surgery CONSULTANTS:  [facility], [facility], [facility] CODE STATUS: Full DIET:  NPO  TPN DVT Px:  PAS GI Px:  PPI  HISTORY OF PRESENT ILLNESS:   71 yo post TAH 11-12 per Dr. Nelly Rout for post menopausal bleeding. She returned to hospital 11-20 with open wound and presumed infection and went to the OR 11-21. She was further notable for renal insuffiencey.Creatine 1.74 11-15 and 3.4 on 11-20 and 2.72 on 11-21 treated with ivf and stopping diuretics. General surgery was consulted and she for exploratory laparotomy due to suspected  stool leaking from surgical wound. PCCM consulted 11-21.     INTERVAL HISTORY:  Pt doing well .  No acute cardiopulm issues  VITAL SIGNS: Temp:  [97.1 F (36.2 C)-98 F (36.7 C)] 98 F (36.7 C) (11/23 0800) Pulse Rate:  [70-82] 79  (11/23 0100) Resp:  [15-18] 17  (11/23 0100) BP: (117-157)/(48-80) 117/53 mmHg (11/23 0400) SpO2:  [99 %-100 %] 99 % (11/23 0100) FIO2  RA sats 96%        INTAKE / OUTPUT: Intake/Output      11/22 0701 - 11/23 0700 11/23 0701 - 11/24 0700   I.V. (mL/kg) 2480 (23.1)    IV Piggyback 150    TPN 650    Total Intake(mL/kg) 3280 (30.6)    Urine (mL/kg/hr) 1270 (0.5)    Drains 200    Blood     Total Output 1470    Net +1810            Note I>>>O 1800cc last 24hr 11/23   PHYSICAL EXAMINATION: General:  WNWD AAF  Neuro:  Intact HEENT:  NO LAN Cardiovascular:  HSR RRR Lungs:  Diminished bs thruout Abdomen:  Abd dressing intact. Wound vac.  Musculoskeletal:  intact Skin:  warm   LABS: cbc  Lab 08/04/12 0558 08/03/12 0430 08/02/12 0437  WBC 8.7 9.0 8.3  HGB 6.7* 7.1* 8.3*  HCT 21.6* 23.2* 27.1*   chemistry  Lab 08/04/12 0558 08/03/12 0430 08/02/12 0437 08/01/12 2211  NA 139 137 136 --  K 4.1 5.1 4.0 --  CO2 25 21 22  --  GLUCOSE 145* 118* 95 --  BUN 36* 36* 44* --  CREATININE 1.72* 1.92* 2.72* --  CALCIUM 8.6 8.6 8.8 --  MG 1.9 1.9 -- --  PHOS 2.5 4.4 -- 3.7   Liver fxn  Lab 08/04/12 0558 08/01/12 1803  AST 20 29  ALT 14 22  ALKPHOS 68 106  BILITOT 0.2* 0.5  PROT 6.3 8.8*  ALBUMIN 1.8* 2.8*   coags  Lab 08/02/12 0437 08/01/12 2211  APTT <20* 25  INR -- 1.13   Sepsis markers No results found for this basename: LATICACIDVEN:3,PROCALCITON:3 in the last 168 hours Cardiac markers No results found for this basename: CKTOTAL:3,CKMB:3,TROPONINI:3 in the last 168 hours BNP No results found for this basename: PROBNP:3 in the last 168 hours ABG No results found for this basename: PHART:3,PCO2ART:3,PO2ART:3,HCO3:3,TCO2:3 in the last 168 hours  CBG trend  Lab 08/03/12 2348 08/03/12 1812  GLUCAP 123* 105*    IMAGING: 11/23 CXR: mild increase in pulm edema     DIAGNOSES: Principal Problem:  *Wound dehiscence, surgical Active Problems:  Acute renal failure  Acute kidney injury  Acute respiratory failure   ASSESSMENT / PLAN:  PULMONARY  ASSESSMENT: 02 dep resp failure No acute issue PLAN:   Pulmonary toilet/ supplemental 02 to maintain sats > 94%  CARDIOVASCULAR  ASSESSMENT:  Hx of HTN.  BP normal  Now off all HTN meds PLAN:    Hold all anthypertensives  RENAL Lab Results  Component Value Date   CREATININE 1.72* 08/04/2012   CREATININE 1.92* 08/03/2012   CREATININE 2.72* 08/02/2012   CREATININE 1.23* 08/24/2011     ASSESSMENT:   Renal Insuff acute on chronic on arb preop>>>improved 11/23 PLAN:   hold antihypertensives and nsaids  Keep I/O even now  D/c cvps  GASTROINTESTINAL  ASSESSMENT:   Suspected bowel compromise. Post op 11-21 . Small bowel with small hole. Will require follow up surgery.  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 Critical Care Time devoted to patient care services described in this note is (   35 )minutes.     Dorcas Carrow Beeper  [phone]  Cell  [phone]  If no response or cell goes to voicemail, call beeper [phone]

## 2012-08-05 LAB — BASIC METABOLIC PANEL
CO2: 29 mEq/L (ref 19–32)
Chloride: 103 mEq/L (ref 96–112)
Creatinine, Ser: 1.67 mg/dL — ABNORMAL HIGH (ref 0.50–1.10)
Glucose, Bld: 162 mg/dL — ABNORMAL HIGH (ref 70–99)

## 2012-08-05 LAB — CBC
HCT: 24.7 % — ABNORMAL LOW (ref 36.0–46.0)
Hemoglobin: 7.7 g/dL — ABNORMAL LOW (ref 12.0–15.0)
MCH: 20.6 pg — ABNORMAL LOW (ref 26.0–34.0)
MCHC: 31.2 g/dL (ref 30.0–36.0)
RDW: 14.5 % (ref 11.5–15.5)

## 2012-08-05 LAB — PHOSPHORUS: Phosphorus: 4.1 mg/dL (ref 2.3–4.6)

## 2012-08-05 LAB — GLUCOSE, CAPILLARY: Glucose-Capillary: 128 mg/dL — ABNORMAL HIGH (ref 70–99)

## 2012-08-05 LAB — MAGNESIUM: Magnesium: 1.7 mg/dL (ref 1.5–2.5)

## 2012-08-05 MED ORDER — LACTATED RINGERS IV SOLN: INTRAVENOUS | Status: AC

## 2012-08-05 MED ORDER — METOPROLOL TARTRATE 1 MG/ML IV SOLN
5.0000 mg | Freq: Four times a day (QID) | INTRAVENOUS | Status: DC
  Administered 2012-08-05 – 2012-08-13 (×32): 5 mg via INTRAVENOUS
  Filled 2012-08-05 (×36): qty 5

## 2012-08-05 MED ORDER — CLINIMIX E/DEXTROSE (5/20) 5 % IV SOLN
INTRAVENOUS | Status: DC
  Administered 2012-08-05: 18:00:00 via INTRAVENOUS
  Filled 2012-08-05: qty 2000

## 2012-08-05 NOTE — Progress Notes (Signed)
Patient ID: Marissa [name], female   DOB: [birth year], 71 y.o.   MRN: [id number] 3 Days Post-Op  Subjective: No specific C/O, doesn't feel well.  No sever abd pain. Incontintant of urine and stool with some skin breakdown on thighs  Objective: Vital signs in last 24 hours: Temp:  [97.5 F (36.4 C)-98.7 F (37.1 C)] 97.8 F (36.6 C) (11/24 0400) Pulse Rate:  [70-88] 71  (11/24 0400) Resp:  [17-21] 18  (11/24 0400) BP: (127-179)/(59-78) 138/61 mmHg (11/24 0400) SpO2:  [36 %-100 %] 98 % (11/24 0400) Weight:  [231 lb 11.3 oz (105.1 kg)] 231 lb 11.3 oz (105.1 kg) (11/24 0000) Last BM Date:  (PTA)  Intake/Output from previous day: 11/23 0701 - 11/24 0700 In: 3149 [I.V.:1715; IV Piggyback:154; TPN:1280] Out: 4600 [Urine:4450; Drains:150] Intake/Output this shift:    General appearance: alert, cooperative and no distress GI: normal findings: soft, non-tender Incision/Wound: VAC intact with cloudy drainage but obvioous enteric content Sperficial skin breakdown medial L thigh  Lab Results:   Basename 08/04/12 0558 08/03/12 0430  WBC 8.7 9.0  HGB 6.7* 7.1*  HCT 21.6* 23.2*  PLT 358 379   BMET  Basename 08/05/12 0525 08/04/12 0558  NA 139 139  K 4.0 4.1  CL 103 107  CO2 29 25  GLUCOSE 162* 145*  BUN 32* 36*  CREATININE 1.67* 1.72*  CALCIUM 8.8 8.6     Studies/Results: Dg Chest Port 1 View  08/04/2012  *RADIOLOGY REPORT*  Clinical Data: Atelectasis  PORTABLE CHEST - 1 VIEW  Comparison: Chest radiograph 08/03/2012  Findings: Stable enlarged heart silhouette. Right PICC line with tip at the cavoatrial junction.  There is mild diffuse air space disease suggesting pulmonary edema.  Likely small left effusion. Edema is slightly worsened.  IMPRESSION: Mild worsening of pulmonary edema.  Left lower lobe atelectasis.   Original Report Authenticated By: Genevive Bi, M.D.     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

## 2012-08-05 NOTE — Progress Notes (Signed)
PARENTERAL NUTRITION CONSULT NOTE - INITIAL  Pharmacy Consult for TPN Indication: Expected prolong ileus  Allergies  Allergen Reactions  . Amlodipine Itching    edema    Patient Measurements: Height: 5\' 5"  (165.1 cm) Weight: 231 lb 11.3 oz (105.1 kg) IBW/kg (Calculated) : 57  Adjusted Body Weight: 72 Usual Weight: 100 kg  Vital Signs: Temp: 97.8 F (36.6 C) (11/24 0400) Temp src: Oral (11/24 0400) BP: 171/79 mmHg (11/24 0800) Pulse Rate: 78  (11/24 0800) Intake/Output from previous day: 11/23 0701 - 11/24 0700 In: 3286.5 [I.V.:1780; IV Piggyback:166.5; TPN:1340] Out: 4600 [Urine:4450; Drains:150] Intake/Output from this shift: Total I/O In: 137.5 [I.V.:65; IV Piggyback:12.5; TPN:60] Out: -   Labs:  [facility] 08/04/12 0558 08/03/12 0430  WBC 8.7 9.0  HGB 6.7* 7.1*  HCT 21.6* 23.2*  PLT 358 379  APTT -- --  INR -- --     [facility] 08/05/12 0525 08/04/12 0558 08/03/12 0430  NA 139 139 137  K 4.0 4.1 5.1  CL 103 107 105  CO2 29 25 21   GLUCOSE 162* 145* 118*  BUN 32* 36* 36*  CREATININE 1.67* 1.72* 1.92*  LABCREA -- -- --  CREAT24HRUR -- -- --  CALCIUM 8.8 8.6 8.6  MG 1.7 1.9 1.9  PHOS 4.1 2.5 4.4  PROT -- 6.3 --  ALBUMIN -- 1.8* --  AST -- 20 --  ALT -- 14 --  ALKPHOS -- 68 --  BILITOT -- 0.2* --  BILIDIR -- -- --  IBILI -- -- --  PREALBUMIN -- 10.1* --  TRIG -- 148 --  CHOLHDL -- -- --  CHOL -- 153 --   Estimated Creatinine Clearance: 37.2 ml/min (by C-G formula based on Cr of 1.67).    Basename 08/05/12 0533 08/04/12 2327 08/04/12 1719  GLUCAP 135* 121* 102*    Medical History: Past Medical History  Diagnosis Date  . Hypertension   . Renal insufficiency     baseline Cr ~ 1.3  . Hyperlipidemia   . History of DVT (deep vein thrombosis)   . History of colonic polyps   . Gout   . Cardiomegaly   . CAD (coronary artery disease)     s/p stent placement 04/2005 and s/p PTCA in 04/2008 by Dr. Jonelle Sidle.  . Menorrhagia   . Polyarthritis   .  Microcytic anemia   . OSA (obstructive sleep apnea)   . GERD (gastroesophageal reflux disease)   . Esophagitis   . Hidradenitis suppurativa     s/p axillary sweat gland removal  . H/O blood transfusion reaction     at [facility] hospital  . Shortness of breath     OCCASIONAL  . Headache   . Diverticulitis   . Difficulty sleeping   . PMB (postmenopausal bleeding)     X 2 YRS  . Bowel trouble     OCCASIONAL BOWEL INCONTINENCE    Medications:  Scheduled:     . antiseptic oral rinse  15 mL Mouth Rinse q12n4p  . chlorhexidine  15 mL Mouth Rinse BID  . enoxaparin  30 mg Subcutaneous Q24H  . [COMPLETED] furosemide  40 mg Intravenous Once  . insulin aspart  0-9 Units Subcutaneous Q6H  . piperacillin-tazobactam (ZOSYN)  IV  3.375 g Intravenous Q8H  . sodium chloride  10-40 mL Intracatheter Q12H    Insulin Requirements in the past 24 hours:  No insulin orders  Current Nutrition:  NPO  Assessment: 8 YOF s/p total abdominal hysterectomy, Bilateral salpingo oophorectomy, Extensive enterolysis for  leiomyoma on 11/12 (d/c'd on 11/17). She represented 11/20 with abd pain, wound drainage and wound dehiscence and likely wound infection. She was noted to be in ARF (on CRI) at time of re-admission. She was taken to OR 11/21 for ex lap and found ECF with placement of wound vac. Orders to start TPN for expected prolonged ileus. TNA started 11/22   Labs: Electrolytes: all wnl Renal: SCr = improved to 1.67 (appears baseline ~1.2), good UOP LFTs: WNL on 11/23 TG/Chol: WNL Pre-Albumin: 10.1 (11/23)  CBGs: at goal < 150;  no h/o DM IV fluid: LR @ 65 ml/hr   Nutritional Goals:  RD assessment 11/22: 4098-1191 kCal, 80-100 grams of protein per day  Clinimix E5/20 at goal 18ml/hr will provide 84gm of protein and 1958 Kcal on MWF with lipids and 1478 KCals w/o lipids for avg 1684 kcal/day.  Plan:   Increase Clinimix E 5/20 to goal 70 ml/hr tonight at 1800.   Decrease MIVF to 55 ml/hr with  increase in TNA rate tonight   MVI, Trace Elements, and Lipid emulsion on MWF only (on national backorder)  Continue CBGs/sensitive SSI q6h  TNA labs on Monday's and Thursdays  Satya Bohall, Loma Messing PharmD Pager #: [phone] 8:28 AM 08/05/2012

## 2012-08-05 NOTE — Progress Notes (Signed)
PULMONARY  / CRITICAL CARE MEDICINE  Name: Marissa [name] MRN: [id number] DOB: 07/18/1941    LOS: 4  REFERRING MD : Corliss Skains  CHIEF COMPLAINT: Post op resp failure, renal failure  BRIEF PATIENT DESCRIPTION: 71 yo post TAH 11-12 per Dr. Nelly Rout for post menopausal bleeding. 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  LINES / TUBES:  11-21 rt i j cvl>>d/c  11-21 a line>>d/c 11/22 RUE PICC 3L>>>  CULTURES: MRSA 11/21 > neg Urine 11/21 >>>e coli 25K   ANTIBIOTICS: 11-21 zoysn>>  SIGNIFICANT EVENTS:  11-21 to ORAbdominal wound dehiscence with enterocutaneous fistula And abd wound vac placement   LEVEL OF CARE:  ICU PRIMARY SERVICE:  Surgery CONSULTANTS:  [facility], [facility], P[facility]M CODE STATUS: Full DIET:  NPO  TPN DVT Px:  PAS GI Px:  PPI  HISTORY OF PRESENT ILLNESS:   71 yo post TAH 11-12 per Dr. Nelly Rout for post menopausal bleeding. She returned to hospital 11-20 with open wound and presumed infection and went to the OR 11-21. She was further notable for renal insuffiencey.Creatine 1.74 11-15 and 3.4 on 11-20 and 2.72 on 11-21 treated with ivf and stopping diuretics. General surgery was consulted and she for exploratory laparotomy due to suspected  stool leaking from surgical wound. PCCM consulted 11-21.     INTERVAL HISTORY:  Pt doing well .  No acute cardiopulm issues  VITAL SIGNS: Temp:  [97.5 F (36.4 C)-98.7 F (37.1 C)] 97.8 F (36.6 C) (11/24 0400) Pulse Rate:  [70-88] 78  (11/24 0800) Resp:  [17-21] 20  (11/24 0800) BP: (138-179)/(61-79) 171/79 mmHg (11/24 0800) SpO2:  [36 %-100 %] 100 % (11/24 0800) Weight:  [105.1 kg (231 lb 11.3 oz)] 105.1 kg (231 lb 11.3 oz) (11/24 0000) FIO2  RA sats 96%        INTAKE / OUTPUT: Intake/Output      11/23 0701 - 11/24  0700 11/24 0701 - 11/25 0700   I.V. (mL/kg) 1780 (16.9) 65 (0.6)   IV Piggyback 166.5 12.5   TPN 1340 60   Total Intake(mL/kg) 3286.5 (31.3) 137.5 (1.3)   Urine (mL/kg/hr) 4450 (1.8)    Drains 150    Total Output 4600    Net -1313.5 +137.5        Urine Occurrence 2 x    Stool Occurrence 2 x       Note I>>>O 1800cc last 24hr 11/23   PHYSICAL EXAMINATION: General:  WNWD AAF  Neuro:  Intact HEENT:  NO LAN Cardiovascular:  HSR RRR Lungs:  Diminished bs thruout Abdomen:  Abd dressing intact. Wound vac.  Musculoskeletal:  intact Skin:  warm   LABS: cbc  Lab 08/05/12 0900 08/04/12 0558 08/03/12 0430  WBC 7.0 8.7 9.0  HGB 7.7* 6.7* 7.1*  HCT 24.7* 21.6* 23.2*   chemistry  Lab 08/05/12 0525 08/04/12 0558 08/03/12 0430  NA 139 139 137  K 4.0 4.1 5.1  CO2 29 25 21   GLUCOSE 162* 145* 118*  BUN 32* 36* 36*  CREATININE 1.67* 1.72* 1.92*  CALCIUM 8.8 8.6 8.6  MG 1.7 1.9 1.9  PHOS 4.1 2.5 4.4   Liver fxn  Lab 08/04/12 0558 08/01/12 1803  AST 20 29  ALT 14 22  ALKPHOS 68 106  BILITOT 0.2* 0.5  PROT 6.3 8.8*  ALBUMIN 1.8* 2.8*   coags  Lab 08/02/12 0437 08/01/12 2211  APTT <20* 25  INR -- 1.13   Sepsis markers No results found for this basename: LATICACIDVEN:3,PROCALCITON:3 in the last 168 hours Cardiac markers No results found for this basename: CKTOTAL:3,CKMB:3,TROPONINI:3 in the last 168 hours BNP No results found for this basename: PROBNP:3 in the last 168 hours ABG No results found for this basename: PHART:3,PCO2ART:3,PO2ART:3,HCO3:3,TCO2:3 in the last 168 hours  CBG trend  Lab 08/05/12 0533 08/04/12 2327 08/04/12 1719 08/04/12 1153 08/04/12 0609  GLUCAP 135* 121* 102* 112* 145*    IMAGING: No CXR 11/24   DIAGNOSES: Principal Problem:  *Wound dehiscence, surgical Active Problems:  Acute renal failure  Acute kidney injury  Acute respiratory failure   ASSESSMENT / PLAN:  PULMONARY  ASSESSMENT: 02 dep resp failure No acute issue PLAN:     Pulmonary toilet/ supplemental 02 to maintain sats > 94%  CARDIOVASCULAR  ASSESSMENT:  Hx of HTN. BP rising.   PLAN:  Will start  IV Beta blockade. Need to avoid ace inhibitors. Pt on benicar/hct at home   RENAL Lab Results  Component Value Date   CREATININE 1.67* 08/05/2012   CREATININE 1.72* 08/04/2012   CREATININE 1.92* 08/03/2012   CREATININE 1.23* 08/24/2011     ASSESSMENT:   Renal Insuff acute on chronic on arb preop>>>improved 11/24 PLAN:   Add iv beta blockade for HTN Keep I/O even now  D/c cvps  GASTROINTESTINAL  ASSESSMENT:   Suspected bowel compromise. Post op 11-21 . Small bowel with small hole. Will require follow up surgery.  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  May reinvolve [facility] for medical f/u then.    Dorcas Carrow Beeper  [phone]  Cell  [phone]  If no response or cell goes to voicemail, call beeper [phone]

## 2012-08-05 NOTE — Progress Notes (Signed)
Pt refused CPAP for the night again.  RT to monitor and assess as needed.

## 2012-08-06 LAB — GLUCOSE, CAPILLARY
Glucose-Capillary: 171 mg/dL — ABNORMAL HIGH (ref 70–99)
Glucose-Capillary: 118 mg/dL — ABNORMAL HIGH (ref 70–99)

## 2012-08-06 LAB — TYPE AND SCREEN
ABO/RH(D): B POS
Antibody Screen: NEGATIVE
Unit division: 0
Unit division: 0

## 2012-08-06 LAB — MAGNESIUM: Magnesium: 1.7 mg/dL (ref 1.5–2.5)

## 2012-08-06 LAB — CBC
MCV: 66.4 fL — ABNORMAL LOW (ref 78.0–100.0)
Platelets: 409 10*3/uL — ABNORMAL HIGH (ref 150–400)
RDW: 14.6 % (ref 11.5–15.5)
WBC: 6.9 10*3/uL (ref 4.0–10.5)

## 2012-08-06 LAB — DIFFERENTIAL
Basophils Absolute: 0 10*3/uL (ref 0.0–0.1)
Lymphocytes Relative: 15 % (ref 12–46)
Lymphs Abs: 1 10*3/uL (ref 0.7–4.0)
Monocytes Relative: 10 % (ref 3–12)
Neutrophils Relative %: 71 % (ref 43–77)

## 2012-08-06 LAB — COMPREHENSIVE METABOLIC PANEL
CO2: 26 mEq/L (ref 19–32)
Calcium: 8.8 mg/dL (ref 8.4–10.5)
Creatinine, Ser: 1.57 mg/dL — ABNORMAL HIGH (ref 0.50–1.10)
GFR calc Af Amer: 37 mL/min — ABNORMAL LOW (ref >90)
GFR calc non Af Amer: 32 mL/min — ABNORMAL LOW (ref >90)
Glucose, Bld: 158 mg/dL — ABNORMAL HIGH (ref 70–99)

## 2012-08-06 LAB — PHOSPHORUS: Phosphorus: 3.5 mg/dL (ref 2.3–4.6)

## 2012-08-06 MED ORDER — DEXTROSE 10 % IV SOLN
INTRAVENOUS | Status: AC
  Administered 2012-08-06 (×2): 70 mL/h via INTRAVENOUS

## 2012-08-06 MED ORDER — FAT EMULSION 20 % IV EMUL
240.0000 mL | INTRAVENOUS | Status: AC
  Administered 2012-08-06: 240 mL via INTRAVENOUS
  Filled 2012-08-06: qty 250

## 2012-08-06 MED ORDER — M.V.I. ADULT IV INJ
INJECTION | INTRAVENOUS | Status: AC
  Administered 2012-08-06: 18:00:00 via INTRAVENOUS
  Filled 2012-08-06: qty 2000

## 2012-08-06 NOTE — Progress Notes (Signed)
Pt refusing CPAP at this time. vss. RT will assist as needed.

## 2012-08-06 NOTE — Progress Notes (Signed)
4 Days Post-Op  Subjective: Continues to be incontinent of urine.  Objective: Vital signs in last 24 hours: Temp:  [97 F (36.1 C)-99 F (37.2 C)] 97.6 F (36.4 C) (11/25 0400) Pulse Rate:  [64-78] 70  (11/25 0527) Resp:  [16-20] 17  (11/25 0527) BP: (160-185)/(69-98) 160/78 mmHg (11/25 0527) SpO2:  [99 %-100 %] 99 % (11/25 0527) Weight:  [227 lb 8.2 oz (103.2 kg)] 227 lb 8.2 oz (103.2 kg) (11/25 0200) Last BM Date: 08/05/12  Intake/Output from previous day: 11/24 0701 - 11/25 0700 In: 3087.5 [I.V.:1430; IV Piggyback:137.5; TPN:1520] Out: 870 [Urine:775; Drains:95] Intake/Output this shift:    PE: General- In NAD Abdomen-soft, urine loosening VAC dressing, VAC removed-scant enteric colored drainage on white sponge, granulation tissue forming; VAC white sponge placed in deep part of wound with black sponge placed on top of white sponge. Extr-inner thighs soaked with urine with some skin breakdown  Lab Results:   Basename 08/06/12 0430 08/05/12 0900  WBC 6.9 7.0  HGB 7.5* 7.7*  HCT 23.9* 24.7*  PLT 409* 445*   BMET  Basename 08/06/12 0430 08/05/12 0525  NA 134* 139  K 4.2 4.0  CL 102 103  CO2 26 29  GLUCOSE 158* 162*  BUN 27* 32*  CREATININE 1.57* 1.67*  CALCIUM 8.8 8.8   PT/INR No results found for this basename: LABPROT:2,INR:2 in the last 72 hours Comprehensive Metabolic Panel:    Component Value Date/Time   NA 134* 08/06/2012 0430   K 4.2 08/06/2012 0430   CL 102 08/06/2012 0430   CO2 26 08/06/2012 0430   BUN 27* 08/06/2012 0430   CREATININE 1.57* 08/06/2012 0430   CREATININE 1.23* 08/24/2011 1133   GLUCOSE 158* 08/06/2012 0430   CALCIUM 8.8 08/06/2012 0430   AST 36 08/06/2012 0430   ALT 26 08/06/2012 0430   ALKPHOS 74 08/06/2012 0430   BILITOT 0.3 08/06/2012 0430   PROT 6.6 08/06/2012 0430   ALBUMIN 2.0* 08/06/2012 0430     Studies/Results: No results found.  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  Continue TPN and bowel rest.  Okay for transfer out of ICU from my standpoint.     Wilho Sharpley J 08/06/2012

## 2012-08-06 NOTE — Progress Notes (Signed)
Physical Therapy Treatment Patient Details Name: CEIARA VANDELL MRN: [id number] DOB: 03-07-1941 Today's Date: 08/06/2012 Time: 6578-4696 PT Time Calculation (min): 35 min  PT Assessment / Plan / Recommendation Comments on Treatment Session  Pt progressing very well with mobility and is ambulatory at supervision level.  O2 sats remained in 90's throughout session.     Follow Up Recommendations  Home health PT;Supervision for mobility/OOB     Does the patient have the potential to tolerate intense rehabilitation     Barriers to Discharge        Equipment Recommendations  None recommended by PT    Recommendations for Other Services    Frequency Min 3X/week   Plan Discharge plan remains appropriate    Precautions / Restrictions Precautions Precautions: Fall Precaution Comments: pt has wound vac on abdomen  Restrictions Weight Bearing Restrictions: No   Pertinent Vitals/Pain No pain, noted some fatigue when returning to room from ambulation.     Mobility  Bed Mobility Bed Mobility: Rolling Right;Right Sidelying to Sit;Sitting - Scoot to Edge of Bed Rolling Right: 5: Supervision;With rail Right Sidelying to Sit: 4: Min guard Details for Bed Mobility Assistance: Supervision for rolling for safety with min/guard assist for trunk when sitting on EOB for safety.  Min cues for technique.  Transfers Transfers: Sit to Stand;Stand to Sit Sit to Stand: 4: Min guard;From elevated surface;With upper extremity assist;From bed;From toilet Stand to Sit: 4: Min guard;With upper extremity assist;With armrests;To chair/3-in-1;To toilet Details for Transfer Assistance: Min/guard for safety with cues for hand placement and safety.  Performed x 2 in order to use toilet in restroom.  Does well getting up/down from regular toilet.  Ambulation/Gait Ambulation/Gait Assistance: 5: Supervision;4: Min guard Ambulation Distance (Feet): 400 Feet Assistive device: Rolling walker Ambulation/Gait  Assistance Details: Min cues for negotiating RW as pt tends to veer right and to maintain upright posture.  Gait Pattern: Step-to pattern;Decreased stride length Gait velocity: decreased Stairs: No Wheelchair Mobility Wheelchair Mobility: No    Exercises     PT Diagnosis:    PT Problem List:   PT Treatment Interventions:     PT Goals Acute Rehab PT Goals PT Goal Formulation: With patient Time For Goal Achievement: 08/18/12 Potential to Achieve Goals: Good Pt will go Supine/Side to Sit: with supervision PT Goal: Supine/Side to Sit - Progress: Progressing toward goal Pt will go Sit to Stand: with modified independence PT Goal: Sit to Stand - Progress: Progressing toward goal Pt will Ambulate: 51 - 150 feet;with least restrictive assistive device;with modified independence PT Goal: Ambulate - Progress: Updated due to goal met  Visit Information  Last PT Received On: 08/06/12 Assistance Needed: +1    Subjective Data  Subjective: I think I could have done more walking.  Patient Stated Goal: to be able to use restroom.    Cognition  Overall Cognitive Status: Appears within functional limits for tasks assessed/performed Arousal/Alertness: Awake/alert Orientation Level: Appears intact for tasks assessed Behavior During Session: [facility] for tasks performed    Balance     End of Session PT - End of Session Activity Tolerance: Patient limited by fatigue Patient left: in chair;with call bell/phone within reach Nurse Communication: Mobility status   GP     Page, Meribeth Mattes 08/06/2012, 10:03 AM

## 2012-08-06 NOTE — Progress Notes (Signed)
ANTIBIOTIC CONSULT NOTE - FOLLOW UP  Pharmacy Consult for Zosyn Indication: E.Coli UTI/EFC open wound  Allergies  Allergen Reactions  . Amlodipine Itching    edema    Patient Measurements: Height: 5\' 5"  (165.1 cm) Weight: 227 lb 8.2 oz (103.2 kg) IBW/kg (Calculated) : 57  Adjusted Body Weight:   Vital Signs: Temp: 98.3 F (36.8 C) (11/25 0800) Temp src: Oral (11/25 0800) BP: 173/81 mmHg (11/25 0800) Pulse Rate: 77  (11/25 0800) Intake/Output from previous day: 11/24 0701 - 11/25 0700 In: 3087.5 [I.V.:1430; IV Piggyback:137.5; TPN:1520] Out: 870 [Urine:775; Drains:95] Intake/Output from this shift: Total I/O In: -  Out: 215 [Urine:215]  Labs:  [facility] 08/06/12 0430 08/05/12 0900 08/05/12 0525 08/04/12 0558  WBC 6.9 7.0 -- 8.7  HGB 7.5* 7.7* -- 6.7*  PLT 409* 445* -- 358  LABCREA -- -- -- --  CREATININE 1.57* -- 1.67* 1.72*   Estimated Creatinine Clearance: 39.2 ml/min (by C-G formula based on Cr of 1.57). No results found for this basename: VANCOTROUGH:2,VANCOPEAK:2,VANCORANDOM:2,GENTTROUGH:2,GENTPEAK:2,GENTRANDOM:2,TOBRATROUGH:2,TOBRAPEAK:2,TOBRARND:2,AMIKACINPEAK:2,AMIKACINTROU:2,AMIKACIN:2, in the last 72 hours   Microbiology: Recent Results (from the past 720 hour(s))  SURGICAL PCR SCREEN     Status: Normal   Collection Time   07/20/12 11:35 AM      Component Value Range Status Comment   MRSA, PCR NEGATIVE  NEGATIVE Final    Staphylococcus aureus NEGATIVE  NEGATIVE Final   URINE CULTURE     Status: Normal   Collection Time   08/02/12  4:01 AM      Component Value Range Status Comment   Specimen Description URINE, RANDOM   Final    Special Requests NONE   Final    Culture  Setup Time 08/02/2012 09:18   Final    Colony Count 25,000 COLONIES/ML   Final    Culture ESCHERICHIA COLI   Final    Report Status 08/04/2012 FINAL   Final    Organism ID, Bacteria ESCHERICHIA COLI   Final   SURGICAL PCR SCREEN     Status: Normal   Collection Time   08/02/12 10:31  AM      Component Value Range Status Comment   MRSA, PCR NEGATIVE  NEGATIVE Final    Staphylococcus aureus NEGATIVE  NEGATIVE Final     Anti-infectives     Start     Dose/Rate Route Frequency Ordered Stop   08/02/12 2200  piperacillin-tazobactam (ZOSYN) IVPB 3.375 g       3.375 g 12.5 mL/hr over 240 Minutes Intravenous Every 8 hours 08/02/12 1655     08/01/12 2359   piperacillin-tazobactam (ZOSYN) IVPB 2.25 g  Status:  Discontinued        2.25 g 100 mL/hr over 30 Minutes Intravenous 3 times per day 08/01/12 2330 08/02/12 1651          Assessment: 71 yoF on [facility] for [facility] wound and UTI with EColi.  Pt afebrile, WBC WNL.  Renal function improving with CrCl ~ 39 ml/min.    Plan:  Continue Zosyn 3.375g IV EI.  Monitor renal fxn, WBC, T, and clinical course.   Marissa [name] E 08/06/2012,10:19 AM

## 2012-08-06 NOTE — Progress Notes (Signed)
PARENTERAL NUTRITION CONSULT NOTE - Follow-Up  Pharmacy Consult for TPN Indication: Expected prolong ileus  Allergies  Allergen Reactions  . Amlodipine Itching    edema    Patient Measurements: Height: 5\' 5"  (165.1 cm) Weight: 227 lb 8.2 oz (103.2 kg) IBW/kg (Calculated) : 57  Adjusted Body Weight: 72 Usual Weight: 100 kg  Vital Signs: Temp: 98.3 F (36.8 C) (11/25 0800) Temp src: Oral (11/25 0800) BP: 173/81 mmHg (11/25 0800) Pulse Rate: 77  (11/25 0800) Intake/Output from previous day: 11/24 0701 - 11/25 0700 In: 3087.5 [I.V.:1430; IV Piggyback:137.5; TPN:1520] Out: 870 [Urine:775; Drains:95] Intake/Output from this shift: Total I/O In: -  Out: 215 [Urine:215]  Labs:  [facility] 08/06/12 0430 08/05/12 0900 08/04/12 0558  WBC 6.9 7.0 8.7  HGB 7.5* 7.7* 6.7*  HCT 23.9* 24.7* 21.6*  PLT 409* 445* 358  APTT -- -- --  INR -- -- --     Basename 08/06/12 0430 08/05/12 0525 08/04/12 0558  NA 134* 139 139  K 4.2 4.0 4.1  CL 102 103 107  CO2 26 29 25   GLUCOSE 158* 162* 145*  BUN 27* 32* 36*  CREATININE 1.57* 1.67* 1.72*  LABCREA -- -- --  CREAT24HRUR -- -- --  CALCIUM 8.8 8.8 8.6  MG 1.7 1.7 1.9  PHOS 3.5 4.1 2.5  PROT 6.6 -- 6.3  ALBUMIN 2.0* -- 1.8*  AST 36 -- 20  ALT 26 -- 14  ALKPHOS 74 -- 68  BILITOT 0.3 -- 0.2*  BILIDIR -- -- --  IBILI -- -- --  PREALBUMIN -- -- 10.1*  TRIG 153* -- 148  CHOLHDL -- -- --  CHOL 164 -- 147   Estimated Creatinine Clearance: 39.2 ml/min (by C-G formula based on Cr of 1.57).    Basename 08/06/12 0523 08/05/12 2330 08/05/12 1839  GLUCAP 171* 144* 128*    Medical History: Past Medical History  Diagnosis Date  . Hypertension   . Renal insufficiency     baseline Cr ~ 1.3  . Hyperlipidemia   . History of DVT (deep vein thrombosis)   . History of colonic polyps   . Gout   . Cardiomegaly   . CAD (coronary artery disease)     s/p stent placement 04/2005 and s/p PTCA in 04/2008 by Dr. Jonelle Sidle.  . Menorrhagia   .  Polyarthritis   . Microcytic anemia   . OSA (obstructive sleep apnea)   . GERD (gastroesophageal reflux disease)   . Esophagitis   . Hidradenitis suppurativa     s/p axillary sweat gland removal  . H/O blood transfusion reaction     at [facility] hospital  . Shortness of breath     OCCASIONAL  . Headache   . Diverticulitis   . Difficulty sleeping   . PMB (postmenopausal bleeding)     X 2 YRS  . Bowel trouble     OCCASIONAL BOWEL INCONTINENCE    Medications:  Scheduled:     . antiseptic oral rinse  15 mL Mouth Rinse q12n4p  . chlorhexidine  15 mL Mouth Rinse BID  . enoxaparin  30 mg Subcutaneous Q24H  . insulin aspart  0-9 Units Subcutaneous Q6H  . metoprolol  5 mg Intravenous Q6H  . piperacillin-tazobactam (ZOSYN)  IV  3.375 g Intravenous Q8H  . sodium chloride  10-40 mL Intracatheter Q12H    Insulin Requirements in the past 24 hours:  No insulin orders  Current Nutrition:  NPO  Assessment: 31 YOF s/p total abdominal hysterectomy, Bilateral salpingo  oophorectomy, Extensive enterolysis for leiomyoma on 11/12 (d/c'd on 11/17). She represented 11/20 with abd pain, wound drainage and wound dehiscence and likely wound infection. She was noted to be in ARF (on CRI) at time of re-admission. She was taken to OR 11/21 for ex lap and found ECF with placement of wound vac. Orders to start TPN for expected prolonged ileus. TNA started 11/22   Labs: Electrolytes: Na 134, all others wnl Renal: SCr = improving (appears baseline ~1.2), good UOP LFTs: WNL on 11/23 TG/Chol: WNL Pre-Albumin: 10.1 (11/23) 11/25 value pending.  CBGs: at goal < 150;  no h/o DM IV fluid: LR @ 55 ml/hr  *RN notified IV team of issue with TNA tubing this morning around 8:20.  TNA leaking through hole in tubing and TNA stopped.  PT had received ~ 14 hours of TNA at advanced rate at this time and was tolerating fine per RN.  D10 started when TNA stopped.    Nutritional Goals:  RD assessment 11/22: 1610-9604  kCal, 80-100 grams of protein per day  Clinimix E5/20 at goal 30ml/hr will provide 84gm of protein and 1958 Kcal on MWF with lipids and 1478 KCals w/o lipids for avg 1684 kcal/day.  Plan:   Restart Clinimix E 5/20 at goal 70 ml/hr tonight at 1800. D/C D10 at 1800.   MVI, Trace Elements, and Lipid emulsion on MWF only (on national backorder)  Continue CBGs/sensitive SSI q6h  TNA labs on Monday's and Thursdays  Wynonia Hazard PharmD Pager #: [phone] 11:52 AM 08/06/2012

## 2012-08-06 NOTE — Progress Notes (Signed)
[id number] Earlene Plater, RN, BSN, CCM: CHART REVIEWED AND UPDATED.  Next chart review due on [id number]. NO DISCHARGE NEEDS PRESENT AT THIS TIME. CASE MANAGEMENT [phone]

## 2012-08-07 LAB — GLUCOSE, CAPILLARY
Glucose-Capillary: 113 mg/dL — ABNORMAL HIGH (ref 70–99)
Glucose-Capillary: 137 mg/dL — ABNORMAL HIGH (ref 70–99)

## 2012-08-07 MED ORDER — CLINIMIX E/DEXTROSE (5/20) 5 % IV SOLN
INTRAVENOUS | Status: AC
  Administered 2012-08-07: 17:00:00 via INTRAVENOUS
  Filled 2012-08-07: qty 2000

## 2012-08-07 MED ORDER — ENOXAPARIN SODIUM 40 MG/0.4ML ~~LOC~~ SOLN
40.0000 mg | SUBCUTANEOUS | Status: DC
  Administered 2012-08-07 – 2012-08-15 (×9): 40 mg via SUBCUTANEOUS
  Filled 2012-08-07 (×9): qty 0.4

## 2012-08-07 NOTE — Progress Notes (Signed)
PARENTERAL NUTRITION CONSULT NOTE - Follow-Up  Pharmacy Consult for TPN Indication: Expected prolonged ileus  Allergies  Allergen Reactions  . Amlodipine Itching    edema    Patient Measurements: Height: 5\' 5"  (165.1 cm) Weight: 226 lb 3.1 oz (102.6 kg) IBW/kg (Calculated) : 57  Adjusted Body Weight: 72 Usual Weight: 100 kg  Vital Signs: Temp: 98.9 F (37.2 C) (11/26 0800) Temp src: Axillary (11/26 0800) BP: 137/58 mmHg (11/26 0800) Pulse Rate: 73  (11/26 0800) Intake/Output from previous day: 11/25 0701 - 11/26 0700 In: 2504.7 [I.V.:1340.2; IV Piggyback:112.5; TPN:1052] Out: 2500 [Urine:2500] Intake/Output from this shift:    Labs:  Basename 08/06/12 0430 08/05/12 0900  WBC 6.9 7.0  HGB 7.5* 7.7*  HCT 23.9* 24.7*  PLT 409* 445*  APTT -- --  INR -- --     [facility] 08/06/12 0430 08/05/12 0525  NA 134* 139  K 4.2 4.0  CL 102 103  CO2 26 29  GLUCOSE 158* 162*  BUN 27* 32*  CREATININE 1.57* 1.67*  LABCREA -- --  CREAT24HRUR -- --  CALCIUM 8.8 8.8  MG 1.7 1.7  PHOS 3.5 4.1  PROT 6.6 --  ALBUMIN 2.0* --  AST 36 --  ALT 26 --  ALKPHOS 74 --  BILITOT 0.3 --  BILIDIR -- --  IBILI -- --  PREALBUMIN -- --  TRIG 153* --  CHOLHDL -- --  CHOL 164 --   Estimated Creatinine Clearance: 39 ml/min (by C-G formula based on Cr of 1.57).    Basename 08/07/12 0600 08/06/12 2349 08/06/12 1642  GLUCAP 142* 137* 118*    Medical History: Past Medical History  Diagnosis Date  . Hypertension   . Renal insufficiency     baseline Cr ~ 1.3  . Hyperlipidemia   . History of DVT (deep vein thrombosis)   . History of colonic polyps   . Gout   . Cardiomegaly   . CAD (coronary artery disease)     s/p stent placement 04/2005 and s/p PTCA in 04/2008 by Dr. Jonelle Sidle.  . Menorrhagia   . Polyarthritis   . Microcytic anemia   . OSA (obstructive sleep apnea)   . GERD (gastroesophageal reflux disease)   . Esophagitis   . Hidradenitis suppurativa     s/p axillary sweat  gland removal  . H/O blood transfusion reaction     at [facility]  . Shortness of breath     OCCASIONAL  . Headache   . Diverticulitis   . Difficulty sleeping   . PMB (postmenopausal bleeding)     X 2 YRS  . Bowel trouble     OCCASIONAL BOWEL INCONTINENCE    Medications:  Scheduled:     . antiseptic oral rinse  15 mL Mouth Rinse q12n4p  . chlorhexidine  15 mL Mouth Rinse BID  . enoxaparin (LOVENOX) injection  40 mg Subcutaneous Q24H  . insulin aspart  0-9 Units Subcutaneous Q6H  . metoprolol  5 mg Intravenous Q6H  . piperacillin-tazobactam (ZOSYN)  IV  3.375 g Intravenous Q8H  . sodium chloride  10-40 mL Intracatheter Q12H  . [DISCONTINUED] enoxaparin  30 mg Subcutaneous Q24H    Insulin Requirements in the past 24 hours:  SSI:  2 units  Current Nutrition:  NPO  Assessment: 50 YOF s/p total abdominal hysterectomy, Bilateral salpingo oophorectomy, Extensive enterolysis for leiomyoma on 11/12 (d/c'd on 11/17). She represented 11/20 with abd pain, wound drainage and wound dehiscence and likely wound infection. She was noted to  be in ARF (on CRI) at time of re-admission. She was taken to OR 11/21 for ex lap and found ECF with placement of wound vac. TNA started 11/22 for expected prolonged ileus.  11/26:  No BS per NP note.  No new labs.  No further tubing issues.  Tolerated TNA without issue per RN. Continues on IV Zosyn for E.Coli UTI/wound.    Drains:   Wound Vac:   Foley:   Labs: Electrolytes: Na 134, all others wnl Renal: SCr = improving (appears baseline ~1.2), good UOP LFTs: WNL on 11/23 TG/Chol: WNL Pre-Albumin: 10.1 (11/23) CBGs: at goal < 150;  no h/o DM IV fluid: NS KVO     Nutritional Goals:  RD assessment 11/22: 9811-9147 kCal, 80-100 grams of protein per day  Current Nutrition: Clinimix E5/20 at goal 53ml/hr will provide 84gm of protein and 1958 Kcal on MWF with lipids and 1478 KCals w/o lipids for avg 1684 kcal/day.  Diet:  NPO, ice  chips  Plan:   Continue Clinimx E 5/20 at goal 70 ml/hr.    MVI, Trace Elements, and Lipid emulsion on MWF only (on national backorder)  Continue CBGs/sensitive SSI q6h  TNA labs on Monday's and Thursdays  Wynonia Hazard PharmD Pager #: [phone] 8:57 AM 08/07/2012

## 2012-08-07 NOTE — Progress Notes (Signed)
Physical Therapy Treatment Patient Details Name: Marissa [name] MRN: [id number] DOB: 01-Jul-1941 Today's Date: 08/07/2012 Time: 4098-1191 PT Time Calculation (min): 29 min  PT Assessment / Plan / Recommendation Comments on Treatment Session  Pt. continues to progress well. does get SOB with the distance. sats > 92% RA. Pain 3 abdomen    Follow Up Recommendations  Home health PT;Supervision/Assistance - 24 hour     Does the patient have the potential to tolerate intense rehabilitation     Barriers to Discharge        Equipment Recommendations  None recommended by PT    Recommendations for Other Services    Frequency Min 3X/week   Plan Discharge plan remains appropriate;Frequency remains appropriate    Precautions / Restrictions Precautions Precautions: Fall Precaution Comments: pt has wound vac on abdomen   Pertinent Vitals/Pain 3/10 sats > 92%   Mobility  Bed Mobility Right Sidelying to Sit: 5: Supervision;HOB elevated;With rails Sitting - Scoot to Edge of Bed: 5: Supervision;With rail Details for Bed Mobility Assistance: supervision for safety, and VC for abdominal precautions. Transfers Sit to Stand: 4: Min guard;From bed Stand to Sit: 4: Min guard;To chair/3-in-1;With armrests Details for Transfer Assistance: Min/guard for safety with cues for hand placement and safety Ambulation/Gait Ambulation/Gait Assistance: 4: Min guard Ambulation Distance (Feet): 400 Feet Assistive device: Rolling walker Ambulation/Gait Assistance Details: pt. uses  RW for light support only, not mild dyspnea. encouraged pt to slow speed, conserve energy. Gait Pattern: Step-through pattern;Decreased stride length    Exercises     PT Diagnosis:    PT Problem List:   PT Treatment Interventions:     PT Goals Acute Rehab PT Goals Pt will go Supine/Side to Sit: with supervision;with HOB 0 degrees PT Goal: Supine/Side to Sit - Progress: Partly met Pt will go Sit to Stand: with modified  independence PT Goal: Sit to Stand - Progress: Progressing toward goal Pt will Ambulate: 51 - 150 feet;with modified independence;with least restrictive assistive device PT Goal: Ambulate - Progress: Progressing toward goal  Visit Information  Last PT Received On: 08/07/12 Assistance Needed: +1    Subjective Data  Subjective: I will know when I walk when my feet touch the floor.   Cognition  Overall Cognitive Status: Appears within functional limits for tasks assessed/performed    Balance     End of Session PT - End of Session Activity Tolerance: Patient limited by fatigue Patient left: in chair;with call bell/phone within reach;with nursing in room Nurse Communication: Mobility status   GP     Rada Hay 08/07/2012, 12:10 PM [phone]

## 2012-08-07 NOTE — Progress Notes (Signed)
5 Days Post-Op Procedure(s) (LRB): EXPLORATORY LAPAROTOMY (N/A)  Subjective: When questioned about how she was feeling, pt responded "I don't feel anything."  Denies abdominal pain at this time.   Objective: Vital signs in last 24 hours: Temp:  [97.5 F (36.4 C)-99.2 F (37.3 C)] 98.9 F (37.2 C) (11/26 0800) Pulse Rate:  [73-82] 73  (11/26 0800) Resp:  [16-20] 20  (11/26 0800) BP: (119-175)/(58-80) 137/58 mmHg (11/26 0800) SpO2:  [100 %] 100 % (11/26 0800) Weight:  [226 lb 3.1 oz (102.6 kg)] 226 lb 3.1 oz (102.6 kg) (11/26 0400) Last BM Date: 08/06/12  Intake/Output from previous day: 11/25 0701 - 11/26 0700 In: 2504.7 [I.V.:1340.2; IV Piggyback:112.5; TPN:1052] Out: 2500 [Urine:2500]  Labs: [facility]  08/06/12 0430  08/05/12 0900   WBC  6.9  7.0   HGB  7.5*  7.7*   HCT  23.9*  24.7*   PLT  409*  445*    BMET   Basename  08/06/12 0430  08/05/12 0525   NA  134*  139   K  4.2  4.0   CL  102  103   CO2  26  29   GLUCOSE  158*  162*   BUN  27*  32*   CREATININE  1.57*  1.67*   CALCIUM  8.8  8.8    Physical Examination: General: no distress and lethargic, responding to verbal stimuli by opening eyes, only said one statement then non-verbal Resp: diminished in the bases Cardio: regular rate and rhythm, S1, S2 normal, no murmur, click, rub or gallop GI: abnormal findings:  obese and incision: wound VAC in place, less than 50cc drainage in the canister.  Faint bowel sounds noted.  Abdomen non-tender. Extremities: extremities normal, atraumatic, no cyanosis or edema Decrease in erythema around excoriation in the groin due to urinary incontinence  Assessment: 71 y.o. who underwent a diagnostic lap, TAH, BSO, and extensive enterolysis on 07/24/12 and was discharged on 07/29/12.  After discharge, she was re-admitted on 08/01/12 for abdominal pain, abdominal wound dehiscence, dehydration, and a small bowel fistula.  S/p Procedure(s): EXPLORATORY LAPAROTOMY, Wound VAC  placement: stable Pain:  Morphine ordered IV PRN for pain.  Heme:  Hemoglobin 7.5 and Hematocrit 23.9 on 08/06/12.  Plan to monitor.  ID:  Zosyn IV Q8H ordered.  WBC count on 08/06/12: 6.9.  Afebrile.  CV:  Lopressor 5 mg IV Q6H ordered along with clonidine PO Q6H PRN.  History of hypertension, CAD, stent placement, and cardiomegaly: stable at this time.  GI:  Tolerating po: No: NPO.  Wound VAC to the abdomen in place.  TPN ordered.       GU:  Creatinine improving: 1.57 on 08/06/12.  History of renal insufficiency.  Foley catheter in place due to urinary incontinence compromising wound VAC dressing and skin integrity.  Endo:  CBG (last 3)   Basename 08/07/12 0600 08/06/12 2349 08/06/12 1642  GLUCAP 142* 137* 118*   Prophylaxis: pharmacologic prophylaxis (with any of the following: enoxaparin (Lovenox) 40mg  SQ 2 hours prior to surgery then every day) and intermittent pneumatic compression boots.  Plan: Continue plan of care per General Surgery Transfer to stepdown Continue TPN and bowel rest at this time Continue Zosyn IV and wound VAC dressing with dressing changes on M, W, F PT to assist with mobility Continue foley catheter Monitor labwork Appreciate General Surgery and Critical Care assistance   LOS: 6 days    Marjean Imperato DEAL 08/07/2012, 10:49 AM

## 2012-08-07 NOTE — Progress Notes (Signed)
Patient seen and examined.  Agree with PA's note. No frank succus drainage from VAC.  Will change VAC dressing tomorrow.

## 2012-08-07 NOTE — Progress Notes (Signed)
5 Days Post-Op  Subjective: Easily awaken, non verbal this am, but does not appear to be ion any acute distress.  Objective: Vital signs in last 24 hours: Temp:  [97.5 F (36.4 C)-99.2 F (37.3 C)] 98.3 F (36.8 C) (11/26 0400) Pulse Rate:  [73-82] 74  (11/26 0400) Resp:  [16-19] 17  (11/26 0400) BP: (119-175)/(60-81) 119/60 mmHg (11/26 0400) SpO2:  [100 %] 100 % (11/26 0400) Weight:  [226 lb 3.1 oz (102.6 kg)] 226 lb 3.1 oz (102.6 kg) (11/26 0400) Last BM Date: 08/06/12  Intake/Output from previous day: 11/25 0701 - 11/26 0700 In: 2504.7 [I.V.:1340.2; IV Piggyback:112.5; TPN:1052] Out: 2500 [Urine:2500] Intake/Output this shift:    General appearance: appears stated age, fatigued, no distress and slowed mentation Chest: CTA bilaterally Cardiac: RRR Abdomen: wound vac in place and functioning, (100 ml output) soft, non tender. No BS. GU: foley in place ( output ) Extremities: warm to touch, no edema, or tenderness. + pulses. VSS, afebrile  Lab Results:   Basename 08/06/12 0430 08/05/12 0900  WBC 6.9 7.0  HGB 7.5* 7.7*  HCT 23.9* 24.7*  PLT 409* 445*   BMET  Basename 08/06/12 0430 08/05/12 0525  NA 134* 139  K 4.2 4.0  CL 102 103  CO2 26 29  GLUCOSE 158* 162*  BUN 27* 32*  CREATININE 1.57* 1.67*  CALCIUM 8.8 8.8   PT/INR No results found for this basename: LABPROT:2,INR:2 in the last 72 hours ABG No results found for this basename: PHART:2,PCO2:2,PO2:2,HCO3:2 in the last 72 hours  Studies/Results: No results found.  Anti-infectives: Anti-infectives     Start     Dose/Rate Route Frequency Ordered Stop   08/02/12 2200  piperacillin-tazobactam (ZOSYN) IVPB 3.375 g       3.375 g 12.5 mL/hr over 240 Minutes Intravenous Every 8 hours 08/02/12 1655     08/01/12 2359   piperacillin-tazobactam (ZOSYN) IVPB 2.25 g  Status:  Discontinued        2.25 g 100 mL/hr over 30 Minutes Intravenous 3 times per day 08/01/12 2330 08/02/12 1651           Assessment/Plan:   Patient Active Problem List  Diagnosis  . GOUT  . SLEEP APNEA, OBSTRUCTIVE  . HYPERTENSION  . CAD  . GERD  . RENAL INSUFFICIENCY  . DVT, HX OF  . Vaginal bleeding  . Hyperlipidemia  . Type II or unspecified type diabetes mellitus without mention of complication, uncontrolled  . Folate-deficiency anemia  . Acute gouty arthritis  . Other screening mammogram  . Osteopenia  . Leiomyoma of uterus, unspecified  . Acute renal failure  . Acute kidney injury  . Wound dehiscence, surgical  . 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 Continue TPN, bowel rest, wound vac (change M,W, Fri) 2. Continue foley 3. Continue IV abx 4. Continue to monitor labs (renal failure slowly improving) 5. Ok to transfer to stepdown.      LOS: 6 days    Golda Acre [facility] Surgery Pager # [phone]  08/07/2012

## 2012-08-08 LAB — BASIC METABOLIC PANEL
Calcium: 9.3 mg/dL (ref 8.4–10.5)
Chloride: 102 mEq/L (ref 96–112)
Creatinine, Ser: 1.88 mg/dL — ABNORMAL HIGH (ref 0.50–1.10)
GFR calc Af Amer: 30 mL/min — ABNORMAL LOW (ref >90)
GFR calc non Af Amer: 26 mL/min — ABNORMAL LOW (ref >90)

## 2012-08-08 LAB — GLUCOSE, CAPILLARY
Glucose-Capillary: 116 mg/dL — ABNORMAL HIGH (ref 70–99)
Glucose-Capillary: 129 mg/dL — ABNORMAL HIGH (ref 70–99)

## 2012-08-08 LAB — CBC
MCH: 20.5 pg — ABNORMAL LOW (ref 26.0–34.0)
MCHC: 30.6 g/dL (ref 30.0–36.0)
MCV: 67.1 fL — ABNORMAL LOW (ref 78.0–100.0)
Platelets: 487 10*3/uL — ABNORMAL HIGH (ref 150–400)
RDW: 15 % (ref 11.5–15.5)
WBC: 7.8 10*3/uL (ref 4.0–10.5)

## 2012-08-08 MED ORDER — TRACE MINERALS CR-CU-F-FE-I-MN-MO-SE-ZN IV SOLN
INTRAVENOUS | Status: AC
  Administered 2012-08-08: 18:00:00 via INTRAVENOUS
  Filled 2012-08-08: qty 2000

## 2012-08-08 MED ORDER — SODIUM CHLORIDE 0.9 % IV SOLN
INTRAVENOUS | Status: DC
  Administered 2012-08-10 – 2012-08-12 (×4): via INTRAVENOUS

## 2012-08-08 MED ORDER — FAT EMULSION 20 % IV EMUL
250.0000 mL | INTRAVENOUS | Status: AC
  Administered 2012-08-08: 250 mL via INTRAVENOUS
  Filled 2012-08-08: qty 250

## 2012-08-08 NOTE — Progress Notes (Signed)
VAC change done at the bedside.  The wound is clean with no evidence of enteric drainage.  Granulation tissue is present.  Will plan and checking a CT Friday and if no leak is noted on CT or on VAC sponge, then can start a liquid diet.

## 2012-08-08 NOTE — Progress Notes (Signed)
6 Days Post-Op  Subjective: No complaints.  Bowels moving.  Objective: Vital signs in last 24 hours: Temp:  [97.7 F (36.5 C)-99.5 F (37.5 C)] 98.4 F (36.9 C) (11/27 0004) Pulse Rate:  [71-77] 77  (11/27 0605) Resp:  [16-22] 17  (11/27 0605) BP: (128-170)/(48-76) 170/76 mmHg (11/27 0605) SpO2:  [100 %] 100 % (11/27 0605) Last BM Date: 08/06/12  Intake/Output from previous day: 11/26 0701 - 11/27 0700 In: 1937.5 [IV Piggyback:137.5; TPN:1700] Out: 2725 [Urine:2675; Drains:50] Intake/Output this shift:    PE: General- In NAD Abdomen-soft, VAC on, serous output from [facility] Lab Results:   Basename 08/06/12 0430 08/05/12 0900  WBC 6.9 7.0  HGB 7.5* 7.7*  HCT 23.9* 24.7*  PLT 409* 445*   BMET  Basename 08/06/12 0430  NA 134*  K 4.2  CL 102  CO2 26  GLUCOSE 158*  BUN 27*  CREATININE 1.57*  CALCIUM 8.8   PT/INR No results found for this basename: LABPROT:2,INR:2 in the last 72 hours Comprehensive Metabolic Panel:    Component Value Date/Time   NA 134* 08/06/2012 0430   K 4.2 08/06/2012 0430   CL 102 08/06/2012 0430   CO2 26 08/06/2012 0430   BUN 27* 08/06/2012 0430   CREATININE 1.57* 08/06/2012 0430   CREATININE 1.23* 08/24/2011 1133   GLUCOSE 158* 08/06/2012 0430   CALCIUM 8.8 08/06/2012 0430   AST 36 08/06/2012 0430   ALT 26 08/06/2012 0430   ALKPHOS 74 08/06/2012 0430   BILITOT 0.3 08/06/2012 0430   PROT 6.6 08/06/2012 0430   ALBUMIN 2.0* 08/06/2012 0430     Studies/Results: No results found.  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  VAC change today.   Emeline Simpson J 08/08/2012

## 2012-08-08 NOTE — Progress Notes (Signed)
6 Days Post-Op Procedure(s) (LRB): EXPLORATORY LAPAROTOMY (N/A)  Subjective: Patient reports "feeling pretty good."  Denies abdominal pain, nausea, and vomiting.  Passing flatus and incontinent of loose stools.   Objective: Vital signs in last 24 hours: Temp:  [97.7 F (36.5 C)-99.5 F (37.5 C)] 98.4 F (36.9 C) (11/27 0004) Pulse Rate:  [71-77] 77  (11/27 0810) Resp:  [16-22] 17  (11/27 0810) BP: (128-170)/(48-109) 133/109 mmHg (11/27 0810) SpO2:  [99 %-100 %] 99 % (11/27 0810) Last BM Date: 08/08/12  Intake/Output from previous day: 11/26 0701 - 11/27 0700 In: 1937.5 [IV Piggyback:137.5; TPN:1700] Out: 2725 [Urine:2675; Drains:50]  Physical Examination: General: alert, cooperative, no distress and sitting in the chair.  Requesting to get up to use the restroom in order to have a bowel movement.  RN at the bedside. Resp: mildly diminished in the bases, no crackles, rales, or wheezing noted. Cardio: regular rate and rhythm, S1, S2 normal, no murmur, click, rub or gallop GI: abnormal findings:  hypoactive bowel sounds and obese and incision: wound VAC in place.  Abdomen non-tender and soft. Extremities: extremities normal, atraumatic, no cyanosis or edema No erythema noted with assessment of excoriation in the groin due to urinary incontinence  Assessment: 71 y.o. who underwent a diagnostic lap, TAH, BSO, and extensive enterolysis on 07/24/12 and was discharged on 07/29/12.  After discharge, she was re-admitted on 08/01/12 for abdominal pain, abdominal wound dehiscence, dehydration, and a small bowel fistula.  S/p Procedure(s): EXPLORATORY LAPAROTOMY, Wound VAC placement: stable Pain:  Morphine ordered IV PRN for pain.  Heme:  Hemoglobin 7.5 and Hematocrit 23.9 on 08/06/12.  Plan to monitor.  ID:  Zosyn IV Q8H ordered.  WBC count on 08/06/12: 6.9.  Afebrile.  CV:  Lopressor 5 mg IV Q6H ordered along with clonidine PO Q6H PRN.  History of hypertension, CAD, stent placement, and  cardiomegaly: stable at this time.  GI:  Tolerating po: No: NPO.  Wound VAC to the abdomen in place.  TPN ordered.       GU:  Creatinine improving: 1.57 on 08/06/12.  History of renal insufficiency.  Foley catheter in place due to urinary incontinence compromising wound VAC dressing and skin integrity.  Endo:  CBG (last 3)   Basename 08/08/12 0604 08/07/12 2351 08/07/12 1720  GLUCAP 130* 129* 113*   Prophylaxis: pharmacologic prophylaxis (with any of the following: enoxaparin (Lovenox) 40mg  SQ 2 hours prior to surgery then every day) and intermittent pneumatic compression boots.  Plan: Transfer to [facility] and Bmet this am Continue TPN and bowel rest at this time Continue Zosyn IV and wound VAC dressing with dressing changes on M, W, F PT to assist with mobility Continue foley catheter Appreciate General Surgery and Critical Care assistance   LOS: 7 days    Daxtin Leiker DEAL 08/08/2012, 8:48 AM

## 2012-08-08 NOTE — Progress Notes (Signed)
PARENTERAL NUTRITION CONSULT NOTE - Follow-Up  Pharmacy Consult for TPN Indication: Expected prolonged ileus  Allergies  Allergen Reactions  . Amlodipine Itching    edema    Patient Measurements: Height: 5\' 5"  (165.1 cm) Weight: 226 lb 3.1 oz (102.6 kg) IBW/kg (Calculated) : 57  Adjusted Body Weight: 72 Usual Weight: 100 kg  Vital Signs: Temp: 98.4 F (36.9 C) (11/27 0004) Temp src: Oral (11/27 0004) BP: 170/76 mmHg (11/27 0605) Pulse Rate: 77  (11/27 0605) Intake/Output from previous day: 11/26 0701 - 11/27 0700 In: 1937.5 [IV Piggyback:137.5; TPN:1700] Out: 2725 [Urine:2675; Drains:50] Intake/Output from this shift:    Labs:  Basename 08/06/12 0430 08/05/12 0900  WBC 6.9 7.0  HGB 7.5* 7.7*  HCT 23.9* 24.7*  PLT 409* 445*  APTT -- --  INR -- --     Basename 08/06/12 0430  NA 134*  K 4.2  CL 102  CO2 26  GLUCOSE 158*  BUN 27*  CREATININE 1.57*  LABCREA --  CREAT24HRUR --  CALCIUM 8.8  MG 1.7  PHOS 3.5  PROT 6.6  ALBUMIN 2.0*  AST 36  ALT 26  ALKPHOS 74  BILITOT 0.3  BILIDIR --  IBILI --  PREALBUMIN --  TRIG 153*  CHOLHDL --  CHOL 164   Estimated Creatinine Clearance: 39 ml/min (by C-G formula based on Cr of 1.57).    Basename 08/07/12 2351 08/07/12 1720 08/07/12 1214  GLUCAP 129* 113* 136*    Medical History: Past Medical History  Diagnosis Date  . Hypertension   . Renal insufficiency     baseline Cr ~ 1.3  . Hyperlipidemia   . History of DVT (deep vein thrombosis)   . History of colonic polyps   . Gout   . Cardiomegaly   . CAD (coronary artery disease)     s/p stent placement 04/2005 and s/p PTCA in 04/2008 by Dr. Jonelle Sidle.  . Menorrhagia   . Polyarthritis   . Microcytic anemia   . OSA (obstructive sleep apnea)   . GERD (gastroesophageal reflux disease)   . Esophagitis   . Hidradenitis suppurativa     s/p axillary sweat gland removal  . H/O blood transfusion reaction     at [facility] hospital  . Shortness of breath    OCCASIONAL  . Headache   . Diverticulitis   . Difficulty sleeping   . PMB (postmenopausal bleeding)     X 2 YRS  . Bowel trouble     OCCASIONAL BOWEL INCONTINENCE    Medications:  Scheduled:     . antiseptic oral rinse  15 mL Mouth Rinse q12n4p  . chlorhexidine  15 mL Mouth Rinse BID  . enoxaparin (LOVENOX) injection  40 mg Subcutaneous Q24H  . insulin aspart  0-9 Units Subcutaneous Q6H  . metoprolol  5 mg Intravenous Q6H  . piperacillin-tazobactam (ZOSYN)  IV  3.375 g Intravenous Q8H  . sodium chloride  10-40 mL Intracatheter Q12H    Insulin Requirements in the past 24 hours:  SSI:  3 units  Current Nutrition:  NPO  Assessment: [name] s/p total abdominal hysterectomy, Bilateral salpingo oophorectomy, Extensive enterolysis for leiomyoma on 11/12 (d/c'd on 11/17). She represented 11/20 with abd pain, wound drainage and wound dehiscence and likely wound infection. She was noted to be in ARF (on CRI) at time of re-admission. She was taken to OR 11/21 for ex lap and found ECF with placement of wound vac. TNA started 11/22 for expected prolonged ileus. + BM on 11/25,  continues on TPN for bowel rest for SB fistula.  Labs: Electrolytes: No labs this am Renal: SCr = improving (baseline Scr ~1.2),  I/O=1947/2725 (Output:  Drains=40ml, UOP=2613ml) LFTs: WNL TG/Chol: Trigs=153 on 11/24 Pre-Albumin: 10.1 (11/23) CBGs: at goal < 150;  no h/o DM IV fluid: NS KVO    Nutritional Goals:  RD assessment 11/22: 8119-1478 kCal, 80-100 grams of protein per day  Current Nutrition: Clinimix E5/20 at goal 66ml/hr will provide 84gm of protein and 1958 Kcal on MWF with lipids and 1478 KCals w/o lipids for avg 1684 kcal/day.  Diet:  NPO, ice chips  Plan:   Continue Clinimx E 5/20 at goal 70 ml/hr.    MVI, Trace Elements, and Lipid emulsion on MWF only (on national backorder)  Continue CBGs/sensitive SSI q6h  TNA labs on Monday's and Thursdays  Juliette Alcide, PharmD, BCPS.   Pager:  [phone] 8:27 AM 08/08/2012

## 2012-08-08 NOTE — Progress Notes (Signed)
Nutrition Follow-up  Intervention:  1.  Parenteral nutrition; continued management per PharmD.  Assessment:   Pt admitted with wound dehiscence s/p recent hysterectomy.  Pt with wound vac being changed by MD at time of visit.  Pt continues TPN for prolonged ileus.  Patient is receiving TPN with Clinimix E 5/15 @ 70 ml/hr.  Lipids (20% IVFE @ 10 ml/hr), multivitamins, and trace elements are provided 3 times weekly (MWF) due to national backorder.  Provides 1398 kcal and 84 grams protein daily (based on weekly average).  Meets 83% minimum estimated kcal and 100% minimum estimated protein needs.  Per MD note, pt passing flatus and incontinent of loose stools.  Pt has had 2 stools today (11/27).  Plans is to recheck CT scan Friday and if no leak noted, then liquid diet can be started.  Diet Order:  NPO  Meds: Scheduled Meds:   . antiseptic oral rinse  15 mL Mouth Rinse q12n4p  . chlorhexidine  15 mL Mouth Rinse BID  . enoxaparin (LOVENOX) injection  40 mg Subcutaneous Q24H  . insulin aspart  0-9 Units Subcutaneous Q6H  . metoprolol  5 mg Intravenous Q6H  . piperacillin-tazobactam (ZOSYN)  IV  3.375 g Intravenous Q8H  . sodium chloride  10-40 mL Intracatheter Q12H   Continuous Infusions:   . [EXPIRED] fat emulsion 240 mL (08/06/12 1743)  . TPN (CLINIMIX) +/- additives     And  . fat emulsion    . [EXPIRED] TPN (CLINIMIX) +/- additives 70 mL/hr at 08/06/12 1743  . TPN (CLINIMIX) +/- additives 70 mL/hr at 08/07/12 1717   PRN Meds:.cloNIDine, morphine injection, ondansetron (ZOFRAN) IV, ondansetron, sodium chloride   CMP     Component Value Date/Time   NA 134* 08/06/2012 0430   K 4.2 08/06/2012 0430   CL 102 08/06/2012 0430   CO2 26 08/06/2012 0430   GLUCOSE 158* 08/06/2012 0430   BUN 27* 08/06/2012 0430   CREATININE 1.57* 08/06/2012 0430   CREATININE 1.23* 08/24/2011 1133   CALCIUM 8.8 08/06/2012 0430   PROT 6.6 08/06/2012 0430   ALBUMIN 2.0* 08/06/2012 0430   AST 36  08/06/2012 0430   ALT 26 08/06/2012 0430   ALKPHOS 74 08/06/2012 0430   BILITOT 0.3 08/06/2012 0430   GFRNONAA 32* 08/06/2012 0430   GFRAA 37* 08/06/2012 0430    CBG (last 3)   Basename 08/08/12 0604 08/07/12 2351 08/07/12 1720  GLUCAP 130* 129* 113*     Intake/Output Summary (Last 24 hours) at 08/08/12 1006 Last data filed at 08/08/12 0900  Gross per 24 hour  Intake 1817.5 ml  Output   2525 ml  Net -707.5 ml    Weight Status:  226 lbs, stable Admission wt: 221 lbs  Re-estimated needs:  1675-1825 kcal, 80-100g protein, >2.0 L/day  Nutrition Dx:  Inadequate oral intake, ongoing  Monitor:   1.  Parenteral nutrition; continued management per PharmD.   2.  Wt/wt change; monitor trends 3.  Food/Beverage; diet advancement per MD discretion.   Loyce Dys, MS RD LDN Clinical Inpatient Dietitian Pager: [phone] Weekend/After hours pager: [phone]

## 2012-08-08 NOTE — Progress Notes (Signed)
Rec'd patient from ICU. VS stable. No s/s of distress. Call bell with in reach.

## 2012-08-08 NOTE — Progress Notes (Addendum)
ANTIBIOTIC CONSULT NOTE - FOLLOW UP  Pharmacy Consult for Zosyn Indication: E.Coli UTI/ECF open wound with wound vac  Allergies  Allergen Reactions  . Amlodipine Itching    edema    Patient Measurements: Height: 5\' 5"  (165.1 cm) Weight: 226 lb 3.1 oz (102.6 kg) IBW/kg (Calculated) : 57  Adjusted Body Weight:    Vital Signs: Temp: 98.4 F (36.9 C) (11/27 0004) Temp src: Oral (11/27 0004) BP: 170/76 mmHg (11/27 0605) Pulse Rate: 77  (11/27 0605) Intake/Output from previous day: 11/26 0701 - 11/27 0700 In: 1937.5 [IV Piggyback:137.5; TPN:1700] Out: 2725 [Urine:2675; Drains:50] Intake/Output from this shift:    Labs:  Basename 08/06/12 0430 08/05/12 0900  WBC 6.9 7.0  HGB 7.5* 7.7*  PLT 409* 445*  LABCREA -- --  CREATININE 1.57* --   Estimated Creatinine Clearance: 39 ml/min (by C-G formula based on Cr of 1.57). No results found for this basename: VANCOTROUGH:2,VANCOPEAK:2,VANCORANDOM:2,GENTTROUGH:2,GENTPEAK:2,GENTRANDOM:2,TOBRATROUGH:2,TOBRAPEAK:2,TOBRARND:2,AMIKACINPEAK:2,AMIKACINTROU:2,AMIKACIN:2, in the last 72 hours   Microbiology: Recent Results (from the past 720 hour(s))  SURGICAL PCR SCREEN     Status: Normal   Collection Time   07/20/12 11:35 AM      Component Value Range Status Comment   MRSA, PCR NEGATIVE  NEGATIVE Final    Staphylococcus aureus NEGATIVE  NEGATIVE Final   URINE CULTURE     Status: Normal   Collection Time   08/02/12  4:01 AM      Component Value Range Status Comment   Specimen Description URINE, RANDOM   Final    Special Requests NONE   Final    Culture  Setup Time 08/02/2012 09:18   Final    Colony Count 25,000 COLONIES/ML   Final    Culture ESCHERICHIA COLI   Final    Report Status 08/04/2012 FINAL   Final    Organism ID, Bacteria ESCHERICHIA COLI   Final   SURGICAL PCR SCREEN     Status: Normal   Collection Time   08/02/12 10:31 AM      Component Value Range Status Comment   MRSA, PCR NEGATIVE  NEGATIVE Final    Staphylococcus aureus NEGATIVE  NEGATIVE Final     Anti-infectives     Start     Dose/Rate Route Frequency Ordered Stop   08/02/12 2200  piperacillin-tazobactam (ZOSYN) IVPB 3.375 g       3.375 g 12.5 mL/hr over 240 Minutes Intravenous Every 8 hours 08/02/12 1655     08/01/12 2359   piperacillin-tazobactam (ZOSYN) IVPB 2.25 g  Status:  Discontinued        2.25 g 100 mL/hr over 30 Minutes Intravenous 3 times per day 08/01/12 2330 08/02/12 1651          Assessment: 71 YOF s/p total abdominal hysterectomy, Bilateral salpingo oophorectomy, Extensive enterolysis for leiomyoma on 11/12 (d/c'd on 11/17). She represented 11/20 with abd pain, wound drainage and wound dehiscence and SB fistula and wound infection.  11/21 >> Zosyn >> 11/20 >> gent once  Tmax: afeb WBCs: wnl Renal: Scr improving, CrCl 28ml/min, no new labs today  Micro:  11/21 urine: 25K e. Coli sensitive to zosyn  Plan:  Day #6 zosyn Continue Zosyn 3.375g IV EI.  Monitor renal fxn, WBC, Temp, and clinical course.   Dannielle Huh 08/08/2012,8:37 AM

## 2012-08-08 NOTE — Progress Notes (Signed)
Physical Therapy Treatment Patient Details Name: Marissa [name] MRN: [id number] DOB: 09-04-1941 Today's Date: 08/08/2012 Time: 4098-1191 PT Time Calculation (min): 23 min  PT Assessment / Plan / Recommendation Comments on Treatment Session  Pt progressing well with mobility.  Ambulated well without AD.  Did note some fatigue with exercises, however pt does well.     Follow Up Recommendations  Home health PT;LTACH     Does the patient have the potential to tolerate intense rehabilitation     Barriers to Discharge        Equipment Recommendations  None recommended by PT    Recommendations for Other Services    Frequency Min 3X/week   Plan Discharge plan needs to be updated    Precautions / Restrictions Precautions Precautions: Fall Precaution Comments: pt has wound vac on abdomen Restrictions Weight Bearing Restrictions: No   Pertinent Vitals/Pain No pain    Mobility  Bed Mobility Bed Mobility: Supine to Sit Supine to Sit: 6: Modified independent (Device/Increase time);HOB elevated;With rails Sitting - Scoot to Edge of Bed: 6: Modified independent (Device/Increase time) Details for Bed Mobility Assistance: increased time  Transfers Transfers: Sit to Stand;Stand to Sit Sit to Stand: 4: Min guard;From bed Stand to Sit: 4: Min guard;To chair/3-in-1;With armrests Details for Transfer Assistance: Min/guard for safety with cues for hand placement and safety Ambulation/Gait Ambulation/Gait Assistance: 4: Min guard Ambulation Distance (Feet): 400 Feet Assistive device: None Ambulation/Gait Assistance Details: Ambulated full ICU hallway without AD.  Min/guard for safety, pt has somewhat increased gait speed with cues for safety and to maintain upright posture.  O2 sats remained in 90's throughout and HR was up to 97 at highest.  Gait Pattern: Step-through pattern;Decreased stride length Gait velocity: decreased    Exercises General Exercises - Lower Extremity Hip  ABduction/ADduction: Strengthening;Both;10 reps Hip Flexion/Marching: Strengthening;Both;20 reps Heel Raises: Strengthening;Both;10 reps   PT Diagnosis:    PT Problem List:   PT Treatment Interventions:     PT Goals Acute Rehab PT Goals PT Goal Formulation: With patient Time For Goal Achievement: 08/18/12 Potential to Achieve Goals: Good Pt will go Supine/Side to Sit: Independently PT Goal: Supine/Side to Sit - Progress: Updated due to goal met Pt will go Sit to Stand: with modified independence PT Goal: Sit to Stand - Progress: Progressing toward goal Pt will Ambulate: 51 - 150 feet;with modified independence;with least restrictive assistive device PT Goal: Ambulate - Progress: Progressing toward goal  Visit Information  Last PT Received On: 08/08/12 Assistance Needed: +1    Subjective Data  Subjective: I dont' need the walker.  Patient Stated Goal: to get home   Cognition  Overall Cognitive Status: Appears within functional limits for tasks assessed/performed Arousal/Alertness: Awake/alert Orientation Level: Appears intact for tasks assessed Behavior During Session: [facility] for tasks performed    Balance     End of Session PT - End of Session Equipment Utilized During Treatment: Gait belt Activity Tolerance: Patient limited by fatigue Patient left: in chair;with call bell/phone within reach;with family/visitor present Nurse Communication: Mobility status   GP     Page, Meribeth Mattes 08/08/2012, 11:25 AM

## 2012-08-09 ENCOUNTER — Encounter (HOSPITAL_COMMUNITY): Payer: Self-pay | Admitting: *Deleted

## 2012-08-09 LAB — COMPREHENSIVE METABOLIC PANEL
BUN: 27 mg/dL — ABNORMAL HIGH (ref 6–23)
CO2: 21 mEq/L (ref 19–32)
Calcium: 9.3 mg/dL (ref 8.4–10.5)
Chloride: 99 mEq/L (ref 96–112)
Creatinine, Ser: 1.88 mg/dL — ABNORMAL HIGH (ref 0.50–1.10)
GFR calc Af Amer: 30 mL/min — ABNORMAL LOW (ref >90)
GFR calc non Af Amer: 26 mL/min — ABNORMAL LOW (ref >90)
Glucose, Bld: 127 mg/dL — ABNORMAL HIGH (ref 70–99)
Total Bilirubin: 0.3 mg/dL (ref 0.3–1.2)

## 2012-08-09 LAB — MAGNESIUM: Magnesium: 2.1 mg/dL (ref 1.5–2.5)

## 2012-08-09 LAB — PHOSPHORUS: Phosphorus: 3.5 mg/dL (ref 2.3–4.6)

## 2012-08-09 MED ORDER — CLINIMIX E/DEXTROSE (5/20) 5 % IV SOLN
INTRAVENOUS | Status: DC
  Administered 2012-08-09: 18:00:00 via INTRAVENOUS
  Filled 2012-08-09: qty 2000

## 2012-08-09 NOTE — Progress Notes (Signed)
7 Days Post-Op  Subjective: VAC changed yesterday - no staining on white sponge Patient continues to have bowel movements - incontinent   Objective: Vital signs in last 24 hours: Temp:  [98 F (36.7 C)] 98 F (36.7 C) (11/28 4540) Pulse Rate:  [63-79] 75  (11/28 0614) Resp:  [18-20] 20  (11/28 9811) BP: (132-155)/(65-82) 135/76 mmHg (11/28 0614) SpO2:  [99 %-100 %] 100 % (11/28 0614) Last BM Date: 08/08/12  Intake/Output from previous day: 11/27 0701 - 11/28 0700 In: 1016.3 [I.V.:558.8; IV Piggyback:37.5; TPN:420] Out: 2350 [Urine:2350] Intake/Output this shift:    Sleepy, but arousable Abd - soft, non-tender VAC with good seal  Lab Results:   [facility] 08/08/12 0925  WBC 7.8  HGB 8.3*  HCT 27.1*  PLT 487*   BMET  Basename 08/09/12 0559 08/08/12 0925  NA 129* 134*  K 4.2 4.6  CL 99 102  CO2 21 22  GLUCOSE 127* 117*  BUN 27* 26*  CREATININE 1.88* 1.88*  CALCIUM 9.3 9.3   PT/INR No results found for this basename: LABPROT:2,INR:2 in the last 72 hours ABG No results found for this basename: PHART:2,PCO2:2,PO2:2,HCO3:2 in the last 72 hours  Studies/Results: No results found.  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 Repeat CT scan tomorrow - if no sign of leak will begin clears.  LOS: 8 days    Marissa [name]  K. 08/09/2012

## 2012-08-09 NOTE — Progress Notes (Signed)
PARENTERAL NUTRITION CONSULT NOTE - Follow-Up  Pharmacy Consult for TPN Indication: Expected prolonged ileus  Allergies  Allergen Reactions  . Amlodipine Itching    edema    Patient Measurements: Height: 5\' 5"  (165.1 cm) Weight: 226 lb 3.1 oz (102.6 kg) IBW/kg (Calculated) : 57  Adjusted Body Weight: 72 Usual Weight: 100 kg  Vital Signs: Temp: 98 F (36.7 C) (11/28 0614) Temp src: Oral (11/28 0614) BP: 135/76 mmHg (11/28 0614) Pulse Rate: 75  (11/28 0614) Intake/Output from previous day: 11/27 0701 - 11/28 0700 In: 1016.3 [I.V.:558.8; IV Piggyback:37.5; TPN:420] Out: 2350 [Urine:2350] Intake/Output from this shift:    Labs:  [facility] 08/08/12 0925  WBC 7.8  HGB 8.3*  HCT 27.1*  PLT 487*  APTT --  INR --     Basename 08/09/12 0559 08/08/12 0925  NA 129* 134*  K 4.2 4.6  CL 99 102  CO2 21 22  GLUCOSE 127* 117*  BUN 27* 26*  CREATININE 1.88* 1.88*  LABCREA -- --  CREAT24HRUR -- --  CALCIUM 9.3 9.3  MG 2.1 --  PHOS 3.5 --  PROT 7.6 --  ALBUMIN 2.5* --  AST 23 --  ALT 22 --  ALKPHOS 78 --  BILITOT 0.3 --  BILIDIR -- --  IBILI -- --  PREALBUMIN -- --  TRIG -- --  CHOLHDL -- --  CHOL -- --   Estimated Creatinine Clearance: 32.6 ml/min (by C-G formula based on Cr of 1.88).    Basename 08/09/12 0552 08/09/12 0013 08/08/12 1748  GLUCAP 123* 123* 116*    Medical History: Past Medical History  Diagnosis Date  . Hypertension   . Renal insufficiency     baseline Cr ~ 1.3  . Hyperlipidemia   . History of DVT (deep vein thrombosis)   . History of colonic polyps   . Gout   . Cardiomegaly   . CAD (coronary artery disease)     s/p stent placement 04/2005 and s/p PTCA in 04/2008 by Dr. Jonelle Sidle.  . Menorrhagia   . Polyarthritis   . Microcytic anemia   . OSA (obstructive sleep apnea)   . GERD (gastroesophageal reflux disease)   . Esophagitis   . Hidradenitis suppurativa     s/p axillary sweat gland removal  . H/O blood transfusion reaction    at [facility] hospital  . Shortness of breath     OCCASIONAL  . Headache   . Diverticulitis   . Difficulty sleeping   . PMB (postmenopausal bleeding)     X 2 YRS  . Bowel trouble     OCCASIONAL BOWEL INCONTINENCE    Medications:  Scheduled:     . antiseptic oral rinse  15 mL Mouth Rinse q12n4p  . chlorhexidine  15 mL Mouth Rinse BID  . enoxaparin (LOVENOX) injection  40 mg Subcutaneous Q24H  . insulin aspart  0-9 Units Subcutaneous Q6H  . metoprolol  5 mg Intravenous Q6H  . piperacillin-tazobactam (ZOSYN)  IV  3.375 g Intravenous Q8H  . sodium chloride  10-40 mL Intracatheter Q12H    Insulin Requirements in the past 24 hours:  SSI:  4 units  Current Nutrition:  NPO  Assessment: 5 YOF s/p total abdominal hysterectomy, Bilateral salpingo oophorectomy, Extensive enterolysis for leiomyoma on 11/12 (d/c'd on 11/17). She represented 11/20 with abd pain, wound drainage and wound dehiscence and likely wound infection. She was noted to be in ARF (on CRI) at time of re-admission. She was taken to OR 11/21 for ex lap and  found ECF with placement of wound vac. TNA started 11/22 for expected prolonged ileus. 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 Plan is to repeat CT tom and if no leak then possibly start clear liquid diet.   Labs: Electrolytes: Na = 129 (expect from TPN but unable to adj concentration in premixed Clinimix TPN).  Corr Ca = 10.5 (ULN), other lytes WNL Renal: SCr = 1.88 (baseline Scr ~1.2),  I/O=1098/2350 (Output:  Drains=none documented, UOP=2361ml) LFTs: WNL TG/Chol: Trigs=153 on 11/24 Pre-Albumin: 10.1 (11/23) CBGs: at goal < 150;  no h/o DM IV fluid: NS KVO    Nutritional Goals:  RD assessment 11/22: 1610-9604 kCal, 80-100 grams of protein per day  Current Nutrition: Clinimix E5/20 at goal 44ml/hr will provide 84gm of protein and 1958 Kcal on MWF with lipids and 1478 KCals w/o lipids for avg 1684 kcal/day.  Diet:  NPO, ice chips  Plan:    Continue Clinimx E 5/20 at goal 70 ml/hr.    MVI, Trace Elements, and Lipid emulsion on MWF only (on national backorder)  Continue CBGs/sensitive SSI q6h  TNA labs on Monday's and Thursdays  Juliette Alcide, PharmD, BCPS.   Pager: [phone] 9:07 AM 08/09/2012

## 2012-08-09 NOTE — Progress Notes (Signed)
Patient changed her mind and has decided to try using CPAP. Applied with full face mask and auto titration settings. Tolerating well at this time. VSS

## 2012-08-09 NOTE — Progress Notes (Signed)
Patient refuses to use nocturnal CPAP tonight. She states she does not use it every night at home and does not wish to use it at this time. Equipment remains at the bedside and she understands that she may request RT assistance at any time if she should wish to be compliant.

## 2012-08-10 ENCOUNTER — Other Ambulatory Visit (HOSPITAL_COMMUNITY): Payer: Medicare Other

## 2012-08-10 ENCOUNTER — Ambulatory Visit (HOSPITAL_COMMUNITY): Payer: Medicare Other

## 2012-08-10 LAB — GLUCOSE, CAPILLARY
Glucose-Capillary: 127 mg/dL — ABNORMAL HIGH (ref 70–99)
Glucose-Capillary: 129 mg/dL — ABNORMAL HIGH (ref 70–99)
Glucose-Capillary: 114 mg/dL — ABNORMAL HIGH (ref 70–99)

## 2012-08-10 MED ORDER — FAT EMULSION 20 % IV EMUL
250.0000 mL | INTRAVENOUS | Status: AC
  Administered 2012-08-10: 250 mL via INTRAVENOUS
  Filled 2012-08-10: qty 250

## 2012-08-10 MED ORDER — TRACE MINERALS CR-CU-F-FE-I-MN-MO-SE-ZN IV SOLN
INTRAVENOUS | Status: AC
  Administered 2012-08-10: 18:00:00 via INTRAVENOUS
  Filled 2012-08-10: qty 2000

## 2012-08-10 MED ORDER — CLINIMIX E/DEXTROSE (5/20) 5 % IV SOLN
INTRAVENOUS | Status: DC
  Filled 2012-08-10: qty 2000

## 2012-08-10 MED ORDER — TRACE MINERALS CR-CU-F-FE-I-MN-MO-SE-ZN IV SOLN
INTRAVENOUS | Status: DC
  Filled 2012-08-10: qty 2000

## 2012-08-10 MED ORDER — CLINIMIX E/DEXTROSE (5/20) 5 % IV SOLN
INTRAVENOUS | Status: AC
  Filled 2012-08-10: qty 2000

## 2012-08-10 MED ORDER — IOHEXOL 300 MG/ML  SOLN
80.0000 mL | Freq: Once | INTRAMUSCULAR | Status: AC | PRN
  Administered 2012-08-10: 80 mL via INTRAVENOUS

## 2012-08-10 NOTE — Progress Notes (Signed)
Patient refuses to use nocturnal CPAP tonight. She understands that she may call for assistance at any time if she should change her mind and wish to use it.

## 2012-08-10 NOTE — Progress Notes (Signed)
8 Days Post-Op  Subjective: Patient feeling better  Objective: Vital signs in last 24 hours: Temp:  [97.6 F (36.4 C)-98.7 F (37.1 C)] 98.7 F (37.1 C) (11/29 0450) Pulse Rate:  [70-82] 82  (11/29 0450) Resp:  [18-20] 20  (11/29 0450) BP: (132-144)/(78-88) 132/78 mmHg (11/29 0450) SpO2:  [100 %] 100 % (11/29 0450) Last BM Date: 08/08/12  Intake/Output from previous day: 11/28 0701 - 11/29 0700 In: 4547.5 [I.V.:967.5; IV Piggyback:250; TPN:3330] Out: 2700 [Urine:2650; Drains:50] Intake/Output this shift:    VAC changed - no staining of bowel contents on white sponge; wound is very clean and seems to be granulating well.  No sign of enterocutaneous fistula at this time.  Lab Results:   [facility] 08/08/12 0925  WBC 7.8  HGB 8.3*  HCT 27.1*  PLT 487*   BMET  Basename 08/09/12 0559 08/08/12 0925  NA 129* 134*  K 4.2 4.6  CL 99 102  CO2 21 22  GLUCOSE 127* 117*  BUN 27* 26*  CREATININE 1.88* 1.88*  CALCIUM 9.3 9.3   PT/INR No results found for this basename: LABPROT:2,INR:2 in the last 72 hours ABG No results found for this basename: PHART:2,PCO2:2,PO2:2,HCO3:2 in the last 72 hours  Studies/Results: No results found.  Anti-infectives: Anti-infectives     Start     Dose/Rate Route Frequency Ordered Stop   08/02/12 2200   piperacillin-tazobactam (ZOSYN) IVPB 3.375 g        3.375 g 12.5 mL/hr over 240 Minutes Intravenous Every 8 hours 08/02/12 1655     08/01/12 2359   piperacillin-tazobactam (ZOSYN) IVPB 2.25 g  Status:  Discontinued        2.25 g 100 mL/hr over 30 Minutes Intravenous 3 times per day 08/01/12 2330 08/02/12 1651          Assessment/Plan: s/p Procedure(s) (LRB) with comments: EXPLORATORY LAPAROTOMY (N/A) - placement of abdominal wound vac CT scan today.  If scan is negative for abscess or leak, will begin clear liquids. Patient will need home health for either VAC or daily dressing changes - wet to dry.  LOS: 9 days    Geneieve Duell  K. 08/10/2012

## 2012-08-10 NOTE — Progress Notes (Signed)
PARENTERAL NUTRITION CONSULT NOTE - Follow-Up  Pharmacy Consult for TPN Indication: Expected prolonged ileus  Allergies  Allergen Reactions  . Amlodipine Itching    edema    Patient Measurements: Height: 5\' 5"  (165.1 cm) Weight: 226 lb 3.1 oz (102.6 kg) IBW/kg (Calculated) : 57  Adjusted Body Weight: 72 Usual Weight: 100 kg  Vital Signs: Temp: 98.7 F (37.1 C) (11/29 0450) Temp src: Oral (11/29 0450) BP: 132/78 mmHg (11/29 0450) Pulse Rate: 82  (11/29 0450) Intake/Output from previous day: 11/28 0701 - 11/29 0700 In: 4547.5 [I.V.:967.5; IV Piggyback:250; TPN:3330] Out: 2700 [Urine:2650; Drains:50] Intake/Output from this shift:    Labs:  [facility] 08/08/12 0925  WBC 7.8  HGB 8.3*  HCT 27.1*  PLT 487*  APTT --  INR --     Basename 08/09/12 0559 08/08/12 0925  NA 129* 134*  K 4.2 4.6  CL 99 102  CO2 21 22  GLUCOSE 127* 117*  BUN 27* 26*  CREATININE 1.88* 1.88*  LABCREA -- --  CREAT24HRUR -- --  CALCIUM 9.3 9.3  MG 2.1 --  PHOS 3.5 --  PROT 7.6 --  ALBUMIN 2.5* --  AST 23 --  ALT 22 --  ALKPHOS 78 --  BILITOT 0.3 --  BILIDIR -- --  IBILI -- --  PREALBUMIN -- --  TRIG -- --  CHOLHDL -- --  CHOL -- --   Estimated Creatinine Clearance: 32.6 ml/min (by C-G formula based on Cr of 1.88).    Basename 08/10/12 0628 08/10/12 0001 08/09/12 1753  GLUCAP 127* 132* 117*    Medical History: Past Medical History  Diagnosis Date  . Hypertension   . Renal insufficiency     baseline Cr ~ 1.3  . Hyperlipidemia   . History of DVT (deep vein thrombosis)   . History of colonic polyps   . Gout   . Cardiomegaly   . CAD (coronary artery disease)     s/p stent placement 04/2005 and s/p PTCA in 04/2008 by Dr. Jonelle Sidle.  . Menorrhagia   . Polyarthritis   . Microcytic anemia   . OSA (obstructive sleep apnea)   . GERD (gastroesophageal reflux disease)   . Esophagitis   . Hidradenitis suppurativa     s/p axillary sweat gland removal  . H/O blood transfusion  reaction     at [facility] hospital  . Shortness of breath     OCCASIONAL  . Headache   . Diverticulitis   . Difficulty sleeping   . PMB (postmenopausal bleeding)     X 2 YRS  . Bowel trouble     OCCASIONAL BOWEL INCONTINENCE    Medications:  Scheduled:     . antiseptic oral rinse  15 mL Mouth Rinse q12n4p  . chlorhexidine  15 mL Mouth Rinse BID  . enoxaparin (LOVENOX) injection  40 mg Subcutaneous Q24H  . insulin aspart  0-9 Units Subcutaneous Q6H  . metoprolol  5 mg Intravenous Q6H  . piperacillin-tazobactam (ZOSYN)  IV  3.375 g Intravenous Q8H  . sodium chloride  10-40 mL Intracatheter Q12H    Insulin Requirements in the past 24 hours:  SSI:  3 units  Nutritional Goals:  RD assessment 11/22: 1610-9604 kCal, 80-100 grams of protein per day  Current Nutrition:  NPO. Ice chips - Will follow up results of CT scan and possibly start on CLD  Clinimix E5/20 at goal 58ml/hr will provide 84gm of protein and 1958 Kcal on MWF with lipids and 1478 KCals w/o lipids for avg  1684 kcal/day.  Assessment: 24 YOF s/p total abdominal hysterectomy, Bilateral salpingo oophorectomy, Extensive enterolysis for leiomyoma on 11/12 (d/c'd on 11/17). She represented 11/20 with abd pain, wound drainage and wound dehiscence and likely wound infection. She was noted to be in ARF (on CRI) at time of re-admission. She was taken to OR 11/21 for ex lap and found ECF with placement of wound vac. TNA started 11/22 for expected prolonged ileus. 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 Plan is to repeat CT tom and if no leak then possibly start clear liquid diet.   Labs from 11/28 Electrolytes: Na = 129 (expect from TPN but unable to adj concentration in premixed Clinimix TPN).  Corr Ca = 10.5 (ULN), other lytes WNL Renal: SCr = 1.88 (baseline Scr ~1.2),  I/O=1098/2350 (Output:  Drains=none documented, UOP=2324ml) LFTs: WNL TG/Chol: Trigs=153 on 11/24 Pre-Albumin: 10.1 (11/23) CBGs: at goal  < 150;  no h/o DM IV fluid: NS at 45 ml/hr  Plan:   Continue Clinimx E 5/20 at goal 70 ml/hr.    MVI, Trace Elements, and Lipid emulsion on MWF only (on national backorder)  Continue CBGs/sensitive SSI q6h  TNA labs on Monday's and Thursdays  F/u CT scan from 11/29  BorgerdingLoma Messing PharmD Pager #: [phone] 8:46 AM 08/10/2012

## 2012-08-10 NOTE — Progress Notes (Signed)
Physical Therapy Treatment Patient Details Name: Marissa [name] MRN: [id number] DOB: 07/11/1941 Today's Date: 08/10/2012 Time: 5621-3086 PT Time Calculation (min): 25 min  PT Assessment / Plan / Recommendation Comments on Treatment Session  progressing well; will continue to follow for LE strengthening/HEP; O2 sats 100% HR to 90 during amb    Follow Up Recommendations  Home health PT     Does the patient have the potential to tolerate intense rehabilitation     Barriers to Discharge        Equipment Recommendations  None recommended by PT    Recommendations for Other Services    Frequency Min 3X/week   Plan Discharge plan needs to be updated    Precautions / Restrictions Precautions Precautions: Fall Precaution Comments: pt has wound vac on abdomen   Pertinent Vitals/Pain See comments Denies pain     Mobility  Bed Mobility Bed Mobility: Supine to Sit Rolling Right: 6: Modified independent (Device/Increase time) Sitting - Scoot to Edge of Bed: 6: Modified independent (Device/Increase time) Details for Bed Mobility Assistance: increased time  Transfers Transfers: Sit to Stand;Stand to Sit Sit to Stand: 5: Supervision;From bed;From toilet Stand to Sit: 5: Supervision;To toilet;To bed Details for Transfer Assistance: cues for safety Ambulation/Gait Ambulation/Gait Assistance: 5: Supervision Ambulation Distance (Feet): 220 Feet Assistive device: None Ambulation/Gait Assistance Details: cues for upright posture, difficult due to VAC; no LOB Gait Pattern: Step-through pattern;Decreased stride length    Exercises General Exercises - Lower Extremity Hip Flexion/Marching: 20 reps;10 reps;Strengthening;Both;Standing Heel Raises: Strengthening;Both;15 reps;Standing   PT Diagnosis:    PT Problem List:   PT Treatment Interventions:     PT Goals Acute Rehab PT Goals Time For Goal Achievement: 08/18/12 Potential to Achieve Goals: Good Pt will go Supine/Side to Sit:  Independently PT Goal: Supine/Side to Sit - Progress: Progressing toward goal Pt will go Sit to Supine/Side: with supervision Pt will go Sit to Stand: with modified independence PT Goal: Sit to Stand - Progress: Progressing toward goal Pt will Ambulate: 51 - 150 feet;with modified independence;with least restrictive assistive device PT Goal: Ambulate - Progress: Progressing toward goal Pt will Go Up / Down Stairs: 1-2 stairs;with supervision;with least restrictive assistive device Pt will Perform Home Exercise Program: Independently PT Goal: Perform Home Exercise Program - Progress: Goal set today  Visit Information  Last PT Received On: 08/10/12 Assistance Needed: +1    Subjective Data  Subjective: I just got warm   Cognition  Overall Cognitive Status: Appears within functional limits for tasks assessed/performed Arousal/Alertness: Awake/alert Orientation Level: Appears intact for tasks assessed Behavior During Session: [facility] for tasks performed    Balance  Static Standing Balance Static Standing - Balance Support: No upper extremity supported;During functional activity Static Standing - Level of Assistance: 5: Stand by assistance  End of Session PT - End of Session Activity Tolerance: Patient tolerated treatment well Patient left: Other (comment);with call bell/phone within reach (EOB, pt did not want to lie down or sit in recliner)   GP     [facility] 08/10/2012, 2:56 PM

## 2012-08-11 LAB — GLUCOSE, CAPILLARY
Glucose-Capillary: 130 mg/dL — ABNORMAL HIGH (ref 70–99)
Glucose-Capillary: 147 mg/dL — ABNORMAL HIGH (ref 70–99)
Glucose-Capillary: 122 mg/dL — ABNORMAL HIGH (ref 70–99)

## 2012-08-11 MED ORDER — CLINIMIX E/DEXTROSE (5/20) 5 % IV SOLN
INTRAVENOUS | Status: AC
  Administered 2012-08-11: 18:00:00 via INTRAVENOUS
  Filled 2012-08-11: qty 2000

## 2012-08-11 MED ORDER — INSULIN ASPART 100 UNIT/ML ~~LOC~~ SOLN
0.0000 [IU] | Freq: Three times a day (TID) | SUBCUTANEOUS | Status: DC
  Administered 2012-08-11 – 2012-08-12 (×3): 1 [IU] via SUBCUTANEOUS

## 2012-08-11 NOTE — Progress Notes (Signed)
Patient ID: Marissa [name], female   DOB: August 28, 1941, 71 y.o.   MRN: [id number] 9 Days Post-Op Procedure(s) (LRB): EXPLORATORY LAPAROTOMY (N/A)  Subjective: Patient reports no complaints.   Objective: Vital signs in last 24 hours: Temp:  [97.6 F (36.4 C)-98.8 F (37.1 C)] 97.6 F (36.4 C) (11/30 0551) Pulse Rate:  [70-86] 76  (11/30 0551) Resp:  [18-20] 18  (11/30 0551) BP: (132-141)/(78-87) 141/78 mmHg (11/30 0551) SpO2:  [100 %] 100 % (11/30 0551) Last BM Date: 08/10/12  Intake/Output from previous day: 11/29 0701 - 11/30 0700 In: 2335 [P.O.:240; I.V.:1035; IV Piggyback:100; TPN:960] Out: 2450 [Urine:2400; Drains:50]  Physical Examination: General: alert and no distress GI: normal findings: soft, non-tender Extremities: extremities normal, atraumatic, no cyanosis or edema Vaginal Bleeding: none  Labs:       Assessment:  71 y.o. s/p Procedure(s): EXPLORATORY LAPAROTOMY: stable Pain:  Pain is well-controlled on PCA or oral medications.  Heme:Anemia: Clinically stable  GI:  Tolerating po: Yes   The CT scan was negative: no free air, no dilated bowel and no masses. The CT scan had the following:  Multiple loops of small bowel beneath the incision.  No leakage of contrast or obstruction.   Plan: Clear liquids, per General Surgery.                                                                                      LOS: 10 days    Travante Knee A 08/11/2012, 12:53 PM

## 2012-08-11 NOTE — Progress Notes (Signed)
9 Days Post-Op  Subjective: Tolerating clears well.  No abdominal pain. BM yesterday  Objective: Vital signs in last 24 hours: Temp:  [97.6 F (36.4 C)-98.8 F (37.1 C)] 97.6 F (36.4 C) (11/30 0551) Pulse Rate:  [70-86] 76  (11/30 0551) Resp:  [18-20] 18  (11/30 0551) BP: (132-148)/(78-101) 148/101 mmHg (11/30 1254) SpO2:  [100 %] 100 % (11/30 0551) Last BM Date: 08/10/12  Intake/Output from previous day: 11/29 0701 - 11/30 0700 In: 2335 [P.O.:240; I.V.:1035; IV Piggyback:100; TPN:960] Out: 2450 [Urine:2400; Drains:50] Intake/Output this shift: Total I/O In: 575 [I.V.:225; TPN:350] Out: 300 [Urine:300]  General appearance: alert, cooperative and no distress GI: soft, non-tender; bowel sounds normal; no masses,  no organomegaly VAC with good seal  Lab Results:  No results found for this basename: WBC:2,HGB:2,HCT:2,PLT:2 in the last 72 hours BMET  Basename 08/09/12 0559  NA 129*  K 4.2  CL 99  CO2 21  GLUCOSE 127*  BUN 27*  CREATININE 1.88*  CALCIUM 9.3   PT/INR No results found for this basename: LABPROT:2,INR:2 in the last 72 hours ABG No results found for this basename: PHART:2,PCO2:2,PO2:2,HCO3:2 in the last 72 hours  Studies/Results: Ct Abdomen Pelvis W Contrast  08/10/2012  *RADIOLOGY REPORT*  Clinical Data: Abdominal fascial dehiscence with enterocutaneous fistula, abdominal pain  CT ABDOMEN AND PELVIS WITH CONTRAST  Technique:  Multidetector CT imaging of the abdomen and pelvis was performed following the standard protocol during bolus administration of intravenous contrast.  Contrast: 80mL OMNIPAQUE IOHEXOL 300 MG/ML  SOLN  Comparison: 04/15/2011  Findings: Lung bases are clear.  Small hiatal hernia.  Liver, spleen, pancreas, and adrenal glands are within normal limits.  Status post cholecystectomy.  No intrahepatic or extrahepatic ductal dilatation.  11 mm left upper pole cyst (series 2/image 27).  Left kidney is unremarkable.  No hydronephrosis.   Postsurgical changes in the anterior abdominal wall.  Soft tissue stranding beneath the incision (series 2/image 64), without drainable fluid collection/abscess.  Additional foci of gas in the left anterior abdominal wall (series 2/image 54).  No evidence of bowel obstruction. Multiple loops of small bowel immediately beneath the anterior abdominal wall incision.  No extraluminal contrast is seen.  Atherosclerotic calcifications of the abdominal aorta and branch vessels.  No abdominopelvic ascites.  No suspicious abdominopelvic lymphadenopathy.  Status post hysterectomy.  Associated 3.4 x 2.3 cm fluid collection in the surgical bed (series 2/image 70), likely reflecting a postoperative seroma.  Bladder decompressed with an indwelling Foley catheter.  Degenerative changes of the visualized thoracolumbar spine.  IMPRESSION: Status post hysterectomy.  Suspected small postoperative seroma in the surgical bed.  Postsurgical changes in the anterior abdominal wall without drainable fluid collection/abscess.  Multiple loops of small bowel are present immediately beneath the anterior abdominal wall incision.  No extraluminal contrast is seen.   Original Report Authenticated By: Charline Bills, M.D.     Anti-infectives: Anti-infectives     Start     Dose/Rate Route Frequency Ordered Stop   08/02/12 2200   piperacillin-tazobactam (ZOSYN) IVPB 3.375 g        3.375 g 12.5 mL/hr over 240 Minutes Intravenous Every 8 hours 08/02/12 1655     08/01/12 2359   piperacillin-tazobactam (ZOSYN) IVPB 2.25 g  Status:  Discontinued        2.25 g 100 mL/hr over 30 Minutes Intravenous 3 times per day 08/01/12 2330 08/02/12 1651          Assessment/Plan: s/p Procedure(s) (LRB) with comments: EXPLORATORY LAPAROTOMY (N/A) -  placement of abdominal wound vac Will advance diet as tolerated. Will try to d/c foley tomorrow - patient had some incontinence issues prior to admission. Will need home health for home VAC.  THIS  MUST BE THE [employer] VAC WITH THE WHITE SPONGE.  DO NOT USE THE [facility] FOR THE VAC. Hopefully home soon.   LOS: 10 days    Kjell Brannen K. 08/11/2012

## 2012-08-11 NOTE — Progress Notes (Signed)
ANTIBIOTIC CONSULT NOTE - FOLLOW UP  Pharmacy Consult for Zosyn Indication: E.Coli UTI/ECF open wound with wound vac  Allergies  Allergen Reactions  . Amlodipine Itching    edema   Patient Measurements: Height: 5\' 5"  (165.1 cm) Weight: 226 lb 3.1 oz (102.6 kg) IBW/kg (Calculated) : 57   Vital Signs: Temp: 97.6 F (36.4 C) (11/30 0551) Temp src: Oral (11/30 0551) BP: 141/78 mmHg (11/30 0551) Pulse Rate: 76  (11/30 0551) Intake/Output from previous day: 11/29 0701 - 11/30 0700 In: 2335 [P.O.:240; I.V.:1035; IV Piggyback:100; TPN:960] Out: 2450 [Urine:2400; Drains:50] Intake/Output from this shift:    Labs:  Basename 08/09/12 0559 08/08/12 0925  WBC -- 7.8  HGB -- 8.3*  PLT -- 487*  LABCREA -- --  CREATININE 1.88* 1.88*   Estimated Creatinine Clearance: 32.6 ml/min (by C-G formula based on Cr of 1.88). No results found for this basename: VANCOTROUGH:2,VANCOPEAK:2,VANCORANDOM:2,GENTTROUGH:2,GENTPEAK:2,GENTRANDOM:2,TOBRATROUGH:2,TOBRAPEAK:2,TOBRARND:2,AMIKACINPEAK:2,AMIKACINTROU:2,AMIKACIN:2, in the last 72 hours   Microbiology: Recent Results (from the past 720 hour(s))  SURGICAL PCR SCREEN     Status: Normal   Collection Time   07/20/12 11:35 AM      Component Value Range Status Comment   MRSA, PCR NEGATIVE  NEGATIVE Final    Staphylococcus aureus NEGATIVE  NEGATIVE Final   URINE CULTURE     Status: Normal   Collection Time   08/02/12  4:01 AM      Component Value Range Status Comment   Specimen Description URINE, RANDOM   Final    Special Requests NONE   Final    Culture  Setup Time 08/02/2012 09:18   Final    Colony Count 25,000 COLONIES/ML   Final    Culture ESCHERICHIA COLI   Final    Report Status 08/04/2012 FINAL   Final    Organism ID, Bacteria ESCHERICHIA COLI   Final   SURGICAL PCR SCREEN     Status: Normal   Collection Time   08/02/12 10:31 AM      Component Value Range Status Comment   MRSA, PCR NEGATIVE  NEGATIVE Final    Staphylococcus aureus  NEGATIVE  NEGATIVE Final     Anti-infectives     Start     Dose/Rate Route Frequency Ordered Stop   08/02/12 2200   piperacillin-tazobactam (ZOSYN) IVPB 3.375 g        3.375 g 12.5 mL/hr over 240 Minutes Intravenous Every 8 hours 08/02/12 1655     08/01/12 2359   piperacillin-tazobactam (ZOSYN) IVPB 2.25 g  Status:  Discontinued        2.25 g 100 mL/hr over 30 Minutes Intravenous 3 times per day 08/01/12 2330 08/02/12 1651          Assessment: 71 YOF s/p total abdominal hysterectomy, Bilateral salpingo oophorectomy, Extensive enterolysis for leiomyoma on 11/12 (d/c'd on 11/17). She re-presented 11/20 with abd pain, wound drainage and wound dehiscence and SB fistula and wound infection.  11/20 >> Gent x 1  11/21 >> Zosyn >>  Tmax: afeb WBCs: wnl(11/27) Renal: SCr 1.88(11/27), CrCl 36ml/min.  Micro:  11/21 urine: 25K e. Coli sensitive to zosyn  Plan:   Continue Zosyn 3.375g IV Q8H infused over 4hrs.  Monitor renal fxn, WBC, Temp, and clinical course.   Charolotte Eke, PharmD, pager [phone]. 08/11/2012,7:17 AM.

## 2012-08-11 NOTE — Progress Notes (Signed)
PARENTERAL NUTRITION CONSULT NOTE - Follow-Up  Pharmacy Consult for TPN Indication: Expected prolonged ileus  Allergies  Allergen Reactions  . Amlodipine Itching    edema    Patient Measurements: Height: 5\' 5"  (165.1 cm) Weight: 226 lb 3.1 oz (102.6 kg) IBW/kg (Calculated) : 57  Adjusted Body Weight: 72 Usual Weight: 100 kg  Vital Signs: Temp: 97.6 F (36.4 C) (11/30 0551) Temp src: Oral (11/30 0551) BP: 141/78 mmHg (11/30 0551) Pulse Rate: 76  (11/30 0551) Intake/Output from previous day: 11/29 0701 - 11/30 0700 In: 2335 [P.O.:240; I.V.:1035; IV Piggyback:100; TPN:960] Out: 2450 [Urine:2400; Drains:50] Intake/Output from this shift: Total I/O In: 1415 [I.V.:405; IV Piggyback:50; TPN:960] Out: 950 [Urine:900; Drains:50]  Labs:  [facility] 08/08/12 0925  WBC 7.8  HGB 8.3*  HCT 27.1*  PLT 487*  APTT --  INR --     Basename 08/09/12 0559 08/08/12 0925  NA 129* 134*  K 4.2 4.6  CL 99 102  CO2 21 22  GLUCOSE 127* 117*  BUN 27* 26*  CREATININE 1.88* 1.88*  LABCREA -- --  CREAT24HRUR -- --  CALCIUM 9.3 9.3  MG 2.1 --  PHOS 3.5 --  PROT 7.6 --  ALBUMIN 2.5* --  AST 23 --  ALT 22 --  ALKPHOS 78 --  BILITOT 0.3 --  BILIDIR -- --  IBILI -- --  PREALBUMIN -- --  TRIG -- --  CHOLHDL -- --  CHOL -- --   Estimated Creatinine Clearance: 32.6 ml/min (by C-G formula based on Cr of 1.88).    Basename 08/11/12 0606 08/10/12 2356 08/10/12 1857  GLUCAP 130* 123* 114*   Medications:  Scheduled:     . antiseptic oral rinse  15 mL Mouth Rinse q12n4p  . chlorhexidine  15 mL Mouth Rinse BID  . enoxaparin (LOVENOX) injection  40 mg Subcutaneous Q24H  . insulin aspart  0-9 Units Subcutaneous Q6H  . metoprolol  5 mg Intravenous Q6H  . piperacillin-tazobactam (ZOSYN)  IV  3.375 g Intravenous Q8H  . sodium chloride  10-40 mL Intracatheter Q12H   Insulin Requirements in the past 24 hours:  3 units  Nutritional Goals:  RD recs(11/22): 1610-9604 kCal, 80-100  grams of protein per day. Clinimix E 5/20 at a goal rate of 58ml/hr + IVFE 20% at 62ml/hr on MWF to provide: 84g/day protein, 1684Kcal/day avg.(1958Kcal/day MWF, 1478Kcal/day STTHS).  Current Nutrition:  CL Clinimix E 5/20 at goal.  IVF: NS at 59ml/hr.  Assessment:  35 YOF s/p total abdominal hysterectomy, bilateral salpingo oophorectomy, extensive enterolysis for leiomyoma on 11/12 and discharged 11/17.  11/20 presented with abd pain, wound drainage/dehiscence/likely infected, acute on chronic renal failure.   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  11/22 TNA was started for bowel rest.   11/29 started CL diet.   Electrolytes: no labs this am. Na has been low but unable to adjust in premixed TNA.   LFTs: have been wnl  TGs: 153(11/24)  Prealbumin: 10.1(11/23)  Plan:   Continue Clinimx E 5/20 at goal 70 ml/hr.    MVI, Trace Elements, and Lipid emulsion on MWF only (on national backorder)  Change CBGs/Sensitive SSI to [facility] only.   No meal intake charted.  Bmet in the am.  TNA labs on Monday and Thursday.  Charolotte Eke, PharmD, pager [phone]. 08/11/2012,7:10 AM.

## 2012-08-12 LAB — BASIC METABOLIC PANEL
Chloride: 105 mEq/L (ref 96–112)
GFR calc Af Amer: 41 mL/min — ABNORMAL LOW (ref >90)
Potassium: 4.4 mEq/L (ref 3.5–5.1)
Sodium: 133 mEq/L — ABNORMAL LOW (ref 135–145)

## 2012-08-12 LAB — GLUCOSE, CAPILLARY
Glucose-Capillary: 120 mg/dL — ABNORMAL HIGH (ref 70–99)
Glucose-Capillary: 136 mg/dL — ABNORMAL HIGH (ref 70–99)
Glucose-Capillary: 81 mg/dL (ref 70–99)

## 2012-08-12 MED ORDER — CLINIMIX E/DEXTROSE (5/20) 5 % IV SOLN
INTRAVENOUS | Status: DC
  Filled 2012-08-12: qty 2000

## 2012-08-12 NOTE — Progress Notes (Signed)
10 Days Post-Op  Subjective: Tolerating liquids. +BM  Objective: Vital signs in last 24 hours: Temp:  [97.2 F (36.2 C)-98 F (36.7 C)] 97.2 F (36.2 C) (12/01 0540) Pulse Rate:  [72-97] 74  (12/01 0540) Resp:  [16-18] 18  (12/01 0540) BP: (140-149)/(80-101) 142/80 mmHg (12/01 0540) SpO2:  [94 %-100 %] 100 % (12/01 0540) Last BM Date: 08/11/12  Intake/Output from previous day: 11/30 0701 - 12/01 0700 In: 3139.2 [P.O.:240; I.V.:1087.5; TPN:1811.7] Out: 1676 [Urine:1600; Drains:75; Stool:1] Intake/Output this shift:    General appearance: alert, cooperative and no distress GI: soft, appropriate tenderness, ND, wound vac in place  Lab Results:  No results found for this basename: WBC:2,HGB:2,HCT:2,PLT:2 in the last 72 hours BMET  [facility] 08/12/12 0428  NA 133*  K 4.4  CL 105  CO2 21  GLUCOSE 116*  BUN 26*  CREATININE 1.45*  CALCIUM 9.2   PT/INR No results found for this basename: LABPROT:2,INR:2 in the last 72 hours ABG No results found for this basename: PHART:2,PCO2:2,PO2:2,HCO3:2 in the last 72 hours  Studies/Results: Ct Abdomen Pelvis W Contrast  08/10/2012  *RADIOLOGY REPORT*  Clinical Data: Abdominal fascial dehiscence with enterocutaneous fistula, abdominal pain  CT ABDOMEN AND PELVIS WITH CONTRAST  Technique:  Multidetector CT imaging of the abdomen and pelvis was performed following the standard protocol during bolus administration of intravenous contrast.  Contrast: 80mL OMNIPAQUE IOHEXOL 300 MG/ML  SOLN  Comparison: 04/15/2011  Findings: Lung bases are clear.  Small hiatal hernia.  Liver, spleen, pancreas, and adrenal glands are within normal limits.  Status post cholecystectomy.  No intrahepatic or extrahepatic ductal dilatation.  11 mm left upper pole cyst (series 2/image 27).  Left kidney is unremarkable.  No hydronephrosis.  Postsurgical changes in the anterior abdominal wall.  Soft tissue stranding beneath the incision (series 2/image 64), without  drainable fluid collection/abscess.  Additional foci of gas in the left anterior abdominal wall (series 2/image 54).  No evidence of bowel obstruction. Multiple loops of small bowel immediately beneath the anterior abdominal wall incision.  No extraluminal contrast is seen.  Atherosclerotic calcifications of the abdominal aorta and branch vessels.  No abdominopelvic ascites.  No suspicious abdominopelvic lymphadenopathy.  Status post hysterectomy.  Associated 3.4 x 2.3 cm fluid collection in the surgical bed (series 2/image 70), likely reflecting a postoperative seroma.  Bladder decompressed with an indwelling Foley catheter.  Degenerative changes of the visualized thoracolumbar spine.  IMPRESSION: Status post hysterectomy.  Suspected small postoperative seroma in the surgical bed.  Postsurgical changes in the anterior abdominal wall without drainable fluid collection/abscess.  Multiple loops of small bowel are present immediately beneath the anterior abdominal wall incision.  No extraluminal contrast is seen.   Original Report Authenticated By: Charline Bills, M.D.     Anti-infectives: Anti-infectives     Start     Dose/Rate Route Frequency Ordered Stop   08/02/12 2200   piperacillin-tazobactam (ZOSYN) IVPB 3.375 g  Status:  Discontinued        3.375 g 12.5 mL/hr over 240 Minutes Intravenous Every 8 hours 08/02/12 1655 08/11/12 1309   08/01/12 2359   piperacillin-tazobactam (ZOSYN) IVPB 2.25 g  Status:  Discontinued        2.25 g 100 mL/hr over 30 Minutes Intravenous 3 times per day 08/01/12 2330 08/02/12 1651          Assessment/Plan: s/p Procedure(s) (LRB) with comments: EXPLORATORY LAPAROTOMY (N/A) - placement of abdominal wound vac advance diet, wean TPN, vac change tomorrow  LOS: 11  days    Lodema Pilot DAVID 08/12/2012

## 2012-08-12 NOTE — Progress Notes (Signed)
PARENTERAL NUTRITION CONSULT NOTE - Follow-Up  Pharmacy Consult for TPN Indication: Expected prolonged ileus  Allergies  Allergen Reactions  . Amlodipine Itching    edema    Patient Measurements: Height: 5\' 5"  (165.1 cm) Weight: 226 lb 3.1 oz (102.6 kg) IBW/kg (Calculated) : 57  Adjusted Body Weight: 72 Usual Weight: 100 kg  Vital Signs: Temp: 97.2 F (36.2 C) (12/01 0540) Temp src: Oral (12/01 0540) BP: 142/80 mmHg (12/01 0540) Pulse Rate: 74  (12/01 0540) Intake/Output from previous day: 11/30 0701 - 12/01 0700 In: 3139.2 [P.O.:240; I.V.:1087.5; TPN:1811.7] Out: 1676 [Urine:1600; Drains:75; Stool:1] Intake/Output from this shift:    Labs: No results found for this basename: WBC:3,HGB:3,HCT:3,PLT:3,APTT:3,INR:3 in the last 72 hours   Basename 08/12/12 0428  NA 133*  K 4.4  CL 105  CO2 21  GLUCOSE 116*  BUN 26*  CREATININE 1.45*  LABCREA --  CREAT24HRUR --  CALCIUM 9.2  MG --  PHOS --  PROT --  ALBUMIN --  AST --  ALT --  ALKPHOS --  BILITOT --  BILIDIR --  IBILI --  PREALBUMIN --  TRIG --  CHOLHDL --  CHOL --   Estimated Creatinine Clearance: 42.2 ml/min (by C-G formula based on Cr of 1.45).    Basename 08/12/12 0557 08/11/12 2200 08/11/12 1751  GLUCAP 120* 114* 122*   Medications:  Scheduled:     . antiseptic oral rinse  15 mL Mouth Rinse q12n4p  . chlorhexidine  15 mL Mouth Rinse BID  . enoxaparin (LOVENOX) injection  40 mg Subcutaneous Q24H  . insulin aspart  0-9 Units Subcutaneous TID WC  . metoprolol  5 mg Intravenous Q6H  . sodium chloride  10-40 mL Intracatheter Q12H  . [DISCONTINUED] piperacillin-tazobactam (ZOSYN)  IV  3.375 g Intravenous Q8H   Insulin Requirements in the past 24 hours:  3 units last 24 hrs.  Nutritional Goals:  RD recs(11/22): 1675-1825 kCal, 80-100 grams of protein per day. Clinimix E 5/20 at a goal rate of 35ml/hr + IVFE 20% at 39ml/hr on MWF to provide: 84g/day protein, 1684Kcal/day avg.(1958Kcal/day  MWF, 1478Kcal/day STTHS).  Current Nutrition:  FL - advanced 11/30. Clinimix E 5/20 at goal.  IVF: NS at 47ml/hr.  Assessment:  22 YOF s/p total abdominal hysterectomy, bilateral salpingo oophorectomy, extensive enterolysis for leiomyoma on 11/12 and discharged 11/17.  11/20 presented with abd pain, wound drainage/dehiscence/likely infected, acute on chronic renal failure.   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  11/22 TNA was started for bowel rest.   11/29 started CL diet.  11/30 started FL diet.  12/1 started Reg diet.   Electrolytes: Labs ok. Na remains slightly low but unable to adjust in premixed TNA.   LFTs: have been wnl  TGs: 153(11/24)  Prealbumin: 10.1(11/23)  Plan:   As ordered, wean TPN to off after current bag. Decrease rate to 53ml/hr at 1600 then off at 1800.    DC TNA labs.  Could consider oral MVI.  Cont Sensitive SSI TIDWC for now, defer to MD. (Diagnosis of DM2 but no meds PTA.)   Charolotte Eke, PharmD, pager [phone]. 08/12/2012,10:12 AM.

## 2012-08-13 LAB — CBC
Platelets: 288 10*3/uL (ref 150–400)
RDW: 15.3 % (ref 11.5–15.5)
WBC: 6.8 10*3/uL (ref 4.0–10.5)

## 2012-08-13 LAB — GLUCOSE, CAPILLARY: Glucose-Capillary: 86 mg/dL (ref 70–99)

## 2012-08-13 MED ORDER — METOPROLOL TARTRATE 25 MG PO TABS
25.0000 mg | ORAL_TABLET | Freq: Every day | ORAL | Status: DC
  Administered 2012-08-13 – 2012-08-15 (×3): 25 mg via ORAL
  Filled 2012-08-13 (×3): qty 1

## 2012-08-13 MED ORDER — OXYCODONE HCL 5 MG PO TABS
5.0000 mg | ORAL_TABLET | ORAL | Status: DC | PRN
  Administered 2012-08-13: 10 mg via ORAL
  Administered 2012-08-13 – 2012-08-14 (×2): 5 mg via ORAL
  Filled 2012-08-13: qty 1
  Filled 2012-08-13: qty 2
  Filled 2012-08-13: qty 1

## 2012-08-13 MED ORDER — ADULT MULTIVITAMIN W/MINERALS CH
1.0000 | ORAL_TABLET | Freq: Every day | ORAL | Status: DC
  Administered 2012-08-13 – 2012-08-15 (×3): 1 via ORAL
  Filled 2012-08-13 (×3): qty 1

## 2012-08-13 MED ORDER — FOLIC ACID 1 MG PO TABS
1.0000 mg | ORAL_TABLET | Freq: Every day | ORAL | Status: DC
  Administered 2012-08-13 – 2012-08-15 (×3): 1 mg via ORAL
  Filled 2012-08-13 (×3): qty 1

## 2012-08-13 MED ORDER — SIMVASTATIN 20 MG PO TABS
20.0000 mg | ORAL_TABLET | Freq: Every day | ORAL | Status: DC
  Administered 2012-08-13 – 2012-08-14 (×2): 20 mg via ORAL
  Filled 2012-08-13 (×3): qty 1

## 2012-08-13 MED ORDER — PANTOPRAZOLE SODIUM 40 MG PO TBEC
40.0000 mg | DELAYED_RELEASE_TABLET | Freq: Every day | ORAL | Status: DC
  Administered 2012-08-13 – 2012-08-15 (×3): 40 mg via ORAL
  Filled 2012-08-13 (×3): qty 1

## 2012-08-13 NOTE — Progress Notes (Deleted)
Wound measurements 11.5cm x 4.5cmx4cm

## 2012-08-13 NOTE — Progress Notes (Signed)
Wound vac measurements 11.5cmx4.5cmx3cm

## 2012-08-13 NOTE — Progress Notes (Signed)
Patient continues to refuse nocturnal CPAP. She states she wears her home machine about as often as she has used one during this admission. Equipment pulled from the patient room to be utilized elsewhere. Patient aware she may call for further assistance if she would like and that she may have her home equipment brought in for her if she desires to do so.

## 2012-08-13 NOTE — Progress Notes (Signed)
11 Days Post-Op Procedure(s) (LRB): EXPLORATORY LAPAROTOMY (N/A)  Subjective: Patient reports "doing good."  Tolerating regular diet.  Denies abdominal pain, nausea, and vomiting.  Passing flatus and reporting continued loose stools.   Objective: Vital signs in last 24 hours: Temp:  [97.4 F (36.3 C)-98.4 F (36.9 C)] 97.4 F (36.3 C) (12/02 0543) Pulse Rate:  [73-77] 77  (12/02 0543) Resp:  [16-18] 16  (12/02 0543) BP: (134-166)/(65-84) 134/65 mmHg (12/02 0543) SpO2:  [99 %-100 %] 99 % (12/02 0543) Last BM Date: 08/12/12  Intake/Output from previous day: 12/01 0701 - 12/02 0700 In: 2096.4 [P.O.:120; I.V.:1046.3; TPN:930.2] Out: 900 [Urine:900]  Physical Examination: General: alert, cooperative and no distress Resp: clear to auscultation bilaterally and no crackles, rales, or wheezing noted. Cardio: regular rate and rhythm, S1, S2 normal, no murmur, click, rub or gallop GI: abnormal findings:  obese and incision: wound VAC in place.  Abdomen non-tender and soft.  Active bowel sounds in all quadrants. Extremities: extremities normal, atraumatic, no cyanosis or edema Resolution of excoriation in the groin due to urinary incontinence.  Foley in place at this time.  Assessment: 71 y.o. who underwent a diagnostic lap, TAH, BSO, and extensive enterolysis on 07/24/12 and was discharged on 07/29/12.  After discharge, she was re-admitted on 08/01/12 for abdominal pain, abdominal wound dehiscence, dehydration, and a small bowel fistula.  S/p Procedure(s): EXPLORATORY LAPAROTOMY, Wound VAC placement: stable Pain:  Morphine ordered IV PRN for pain.  Heme:  Hemoglobin 7.6 and Hematocrit 24.6 this am.  Plan to monitor.  ID:  WBC count on 08/13/12: 6.8.  Afebrile.  CV:  Lopressor 25 mg PO daily ordered along with clonidine PO Q6H PRN.  History of hypertension, CAD, stent placement, and cardiomegaly: stable at this time.  GI:  Tolerating po: Yes.  Wound VAC to the abdomen in place.         GU:  Creatinine: 1.45 on 08/12/12.  History of renal insufficiency.  Foley catheter in place due to urinary incontinence compromising wound VAC dressing and skin integrity.  Endo:  CBG (last 3)   Basename 08/13/12 0735 08/12/12 2254 08/12/12 1712  GLUCAP 76 81 118*   Prophylaxis: pharmacologic prophylaxis (with any of the following: enoxaparin (Lovenox) 40mg  SQ 2 hours prior to surgery then every day) and intermittent pneumatic compression boots.  Plan: Wound VAC dressing change today Remove foley catheter Discontinue IVF  Care Management for Home Health needs PT to assist with mobility Continue plan of care per General Surgery Plan for discharge per General Surgery   LOS: 12 days    Quintavious Rinck DEAL 08/13/2012, 8:28 AM

## 2012-08-13 NOTE — Progress Notes (Signed)
Patient ID: Marissa [name], female   DOB: Feb 17, 1941, 71 y.o.   MRN: [id number] 11 Days Post-Op  Subjective: Doing well overall, tolerating diet, +BMs and flatus, min pain   Objective: Vital signs in last 24 hours: Temp:  [97.4 F (36.3 C)-98.4 F (36.9 C)] 97.4 F (36.3 C) (12/02 0543) Pulse Rate:  [73-77] 77  (12/02 0543) Resp:  [16-18] 16  (12/02 0543) BP: (134-166)/(65-84) 134/65 mmHg (12/02 0543) SpO2:  [99 %-100 %] 99 % (12/02 0543) Last BM Date: 08/12/12  Intake/Output from previous day: 12/01 0701 - 12/02 0700 In: 2096.4 [P.O.:120; I.V.:1046.3; TPN:930.2] Out: 900 [Urine:900] Intake/Output this shift:    PE: Abd: soft, non tender, +BS, wound vac in place  Lab Results:   Basename 08/13/12 0544  WBC 6.8  HGB 7.6*  HCT 24.6*  PLT 288   BMET  Basename 08/12/12 0428  NA 133*  K 4.4  CL 105  CO2 21  GLUCOSE 116*  BUN 26*  CREATININE 1.45*  CALCIUM 9.2   PT/INR No results found for this basename: LABPROT:2,INR:2 in the last 72 hours CMP     Component Value Date/Time   NA 133* 08/12/2012 0428   K 4.4 08/12/2012 0428   CL 105 08/12/2012 0428   CO2 21 08/12/2012 0428   GLUCOSE 116* 08/12/2012 0428   BUN 26* 08/12/2012 0428   CREATININE 1.45* 08/12/2012 0428   CREATININE 1.23* 08/24/2011 1133   CALCIUM 9.2 08/12/2012 0428   PROT 7.6 08/09/2012 0559   ALBUMIN 2.5* 08/09/2012 0559   AST 23 08/09/2012 0559   ALT 22 08/09/2012 0559   ALKPHOS 78 08/09/2012 0559   BILITOT 0.3 08/09/2012 0559   GFRNONAA 35* 08/12/2012 0428   GFRAA 41* 08/12/2012 0428   Lipase     Component Value Date/Time   LIPASE 11 05/22/2008 1845       Studies/Results: No results found.  Anti-infectives: Anti-infectives     Start     Dose/Rate Route Frequency Ordered Stop   08/02/12 2200   piperacillin-tazobactam (ZOSYN) IVPB 3.375 g  Status:  Discontinued        3.375 g 12.5 mL/hr over 240 Minutes Intravenous Every 8 hours 08/02/12 1655 08/11/12 1309   08/01/12 2359    piperacillin-tazobactam (ZOSYN) IVPB 2.25 g  Status:  Discontinued        2.25 g 100 mL/hr over 30 Minutes Intravenous 3 times per day 08/01/12 2330 08/02/12 1651           Assessment/Plan 1. POD#10-exp lap: doing well overall, getting stronger, tolerating regular diet and bowel function good.  --care management consult for home VAC, needs KCI vac for Marissa [name] foam.  --d/c foley  --plan for discharge in next 1-2 days   --add PO med for pain  --vac change today  LOS: 12 days    Marissa [name] 08/13/2012

## 2012-08-13 NOTE — Progress Notes (Addendum)
Physical Therapy Treatment D/C PT  Patient Details Name: Marissa [name] MRN: [id number] DOB: August 06, 1941 Today's Date: 08/13/2012 Time: 4098-1191 PT Time Calculation (min): 14 min  PT Assessment / Plan / Recommendation Comments on Treatment Session  no further PT needs; pt verbalizesthe exercises that we have been doing with her but does not want to do them "in front of people" because she feels "silly"    Follow Up Recommendations  Home health PT     Does the patient have the potential to tolerate intense rehabilitation     Barriers to Discharge        Equipment Recommendations  None recommended by PT    Recommendations for Other Services    Frequency Min 3X/week   Plan Discharge plan needs to be updated    Precautions / Restrictions Precautions Precautions: None Precaution Comments: pt has wound vac on abdomen   Pertinent Vitals/Pain     Mobility  Bed Mobility Bed Mobility: Supine to Sit Rolling Right: 7: Independent Right Sidelying to Sit: 7: Independent Sitting - Scoot to Edge of Bed: 6: Modified independent (Device/Increase time) Transfers Transfers: Sit to Stand;Stand to Sit Sit to Stand: 7: Independent Stand to Sit: 7: Independent Ambulation/Gait Ambulation/Gait Assistance: 7: Independent Ambulation Distance (Feet): 400 Feet Assistive device: None Ambulation/Gait Assistance Details: doing well today, no pain, improved trunk extension; no LOB Gait Pattern: Step-through pattern    Exercises     PT Diagnosis:    PT Problem List:   PT Treatment Interventions:     PT Goals Acute Rehab PT Goals Time For Goal Achievement: 08/18/12 Potential to Achieve Goals: Good Pt will go Supine/Side to Sit: Independently PT Goal: Supine/Side to Sit - Progress: Met Pt will go Sit to Supine/Side: with supervision PT Goal: Sit to Supine/Side - Progress: Met Pt will go Sit to Stand: with modified independence PT Goal: Sit to Stand - Progress: Met Pt will Ambulate: 51 -  150 feet;with modified independence;with least restrictive assistive device PT Goal: Ambulate - Progress: Met Pt will Perform Home Exercise Program: Independently PT Goal: Perform Home Exercise Program - Progress: Met  Visit Information  Last PT Received On: 08/13/12 Assistance Needed: +1    Subjective Data  Subjective: come in   Cognition  Overall Cognitive Status: Appears within functional limits for tasks assessed/performed Arousal/Alertness: Awake/alert Orientation Level: Appears intact for tasks assessed Behavior During Session: [facility] [place] for tasks performed    Balance     End of Session PT - End of Session Activity Tolerance: Patient tolerated treatment well Patient left: in chair;with call bell/phone within reach Nurse Communication: Mobility status   GP     [facility] 08/13/2012, 12:26 PM

## 2012-08-14 LAB — GLUCOSE, CAPILLARY: Glucose-Capillary: 92 mg/dL (ref 70–99)

## 2012-08-14 MED ORDER — DIPHENHYDRAMINE HCL 25 MG PO CAPS
25.0000 mg | ORAL_CAPSULE | Freq: Four times a day (QID) | ORAL | Status: DC | PRN
  Administered 2012-08-15: 25 mg via ORAL
  Filled 2012-08-14: qty 1

## 2012-08-14 NOTE — Discharge Summary (Signed)
Physician Discharge Summary  Patient ID: Marissa [name] MRN: [id number] DOB/AGE: 09/27/1940 71 y.o.  Admit date: 08/01/2012 Discharge date: 08/15/2012  Admission Diagnoses: Wound dehiscence, surgical  Discharge Diagnoses:  Principal Problem:  *Wound dehiscence, surgical Active Problems:  Acute renal failure  Acute kidney injury  Discharged Condition:  The patient is in good condition and stable for discharge at this time per Gynecologic Oncology and General Surgery.  She is discharged with home health for management of wound VAC.   Hospital Course:  Marissa [name] is a 71 year old who presented with increased abdominal pain and wound drainage on 08/01/12.  She reported the symptoms as gradual, beginning on 07/31/12.  Originally, the patient underwent a diagnostic laparoscopy , total abdominal hysterectomy, bilateral salpingo oophorectomy, and extensive enterolysis on 07/24/12 by Dr. Nelly Rout with final pathology revealing a leiomyoma.  She developed a abdominal wound dehiscence after discharge, which lead her to [facility] followed by a transfer to [facility] for surgical intervention on 08/01/12.  She was admitted on 08/01/12 with initial lab work resulting a creatinine level of 3.40 and WBC of 11.4. The patient had a history of renal insufficiency but reported that her urinary output remained adequate at home.  Due to urinary incontinence, her groin was excoriated and erythematous upon admission.  After IV hydration, creatinine improved with creatinine on 08/02/12 of 2.72 and creatinine of 1.92 on 08/03/12.  IV Zosyn was initiated on 08/02/12 to 08/11/12 per pharmacy.  General Surgery was consulted and on 08/02/12, she underwent an exploratory lap with wound VAC placement for abdominal wound dehiscence with enterocutaneous fistula by Dr. Corliss Skains.  Post-operatively, she was monitored in ICU with Critical Care consulting.  Hemoglobin and hematocrit were monitored throughout the  hospital stay with hemoglobin of 9.0 on admission and 7.6 on 08/13/12.  Furthermore, TPN was initiated on 08/03/12 and weaned off on 08/12/12 per pharmacy due to expected prolonged ileus.  The patient progressed well with a transfer to [facility] on 08/08/12 and initiation of diet advancement with a regular diet ordered on 08/12/12.  She was discharged on 08/15/12 tolerating a regular diet, ambulating without assistance, along with home health services for management of the wound VAC with the use of the white sponge directly over the bowel.  Discharge was originally planned for 08/14/12 but was postponed due to pending VAC approval.        Consults: pulmonary/intensive care, general surgery and physical therapy, triad hospitalist, respiratory therapy, pharmacy, care management.  Significant Diagnostic Studies: labs, microbiology, and radiology. CBC    Component Value Date/Time   WBC 6.8 08/13/2012 0544   RBC 3.65* 08/13/2012 0544   HGB 7.6* 08/13/2012 0544   HCT 24.6* 08/13/2012 0544   PLT 288 08/13/2012 0544   MCV 67.4* 08/13/2012 0544   MCH 20.8* 08/13/2012 0544   MCHC 30.9 08/13/2012 0544   RDW 15.3 08/13/2012 0544   LYMPHSABS 1.0 08/06/2012 0430   MONOABS 0.7 08/06/2012 0430   EOSABS 0.3 08/06/2012 0430   BASOSABS 0.0 08/06/2012 0430   CMP     Component Value Date/Time   NA 133* 08/12/2012 0428   K 4.4 08/12/2012 0428   CL 105 08/12/2012 0428   CO2 21 08/12/2012 0428   GLUCOSE 116* 08/12/2012 0428   BUN 26* 08/12/2012 0428   CREATININE 1.45* 08/12/2012 0428   CREATININE 1.23* 08/24/2011 1133   CALCIUM 9.2 08/12/2012 0428   PROT 7.6 08/09/2012 0559   ALBUMIN 2.5* 08/09/2012 0559   AST  23 08/09/2012 0559   ALT 22 08/09/2012 0559   ALKPHOS 78 08/09/2012 0559   BILITOT 0.3 08/09/2012 0559   GFRNONAA 35* 08/12/2012 0428   GFRAA 41* 08/12/2012 0428   URINE Culture Setup Time 08/02/2012 09:18 [place] Count 25,000 COLONIES/ML Culture ESCHERICHIA COLI Report Status 08/04/2012 FINAL Organism ID, Bacteria  ESCHERICHIA COLI Resulting Agency [facility]  Dg Chest Port 1 View  08/03/2012 *RADIOLOGY REPORT* Clinical Data: Extubation, atelectasis PORTABLE CHEST - 1 VIEW Comparison: 08/02/2012; 07/26/2029; 07/20/2012; chest CT - 12/14/2010 Findings: Unchanged enlarged cardiac silhouette and mediastinal contours given persistently reduced lung volumes. Atherosclerotic calcifications within the thoracic aorta. Interval removal of jugular approach central venous catheter. No pneumothorax. Mild pulmonary venous congestion without frank evidence of edema. Grossly unchanged perihilar and bibasilar opacities, left greater than right, likely atelectasis. No definite pleural effusion. Unchanged bones. IMPRESSION: Persistently reduced lung volumes with grossly unchanged perihilar and left basilar opacities favored to represent atelectasis. Pulmonary venous congestion without frank evidence of edema. Further evaluation with a PA and lateral chest radiograph may be obtained as clinically indicated. Original Report Authenticated By: Tacey Ruiz, MD   Dg Chest Portable 1 View  08/02/2012 *RADIOLOGY REPORT* Clinical Data: Right IJ line placement PORTABLE CHEST - 1 VIEW Comparison: 07/26/2012; 07/20/2012 Findings: Grossly unchanged enlarged cardiac silhouette and mediastinal contours without atherosclerotic calcifications within the aortic arch. There is unchanged widening of the mediastinum. Interval placement of a right jugular approach central venous catheter with tip overlying the superior aspect of the SVC. No definite pneumothorax. Lung volumes remain reduced with perihilar and basilar opacity. No new focal airspace opacities. Mild elevation of right hemidiaphragm. No definite pleural effusion. Unchanged bones. IMPRESSION: 1. Right jugular approach central venous catheter with tip overlying the superior aspect of the SVC. No pneumothorax. 2. Persistently reduced lung volumes with perihilar and basilar opacities favored to  represent atelectasis. Original Report Authenticated By: Tacey Ruiz, MD   CT Abdomen/Pelvis on 08/10/12: IMPRESSION:  Status post hysterectomy. Suspected small postoperative seroma in the surgical bed.  Postsurgical changes in the anterior abdominal wall without drainable fluid collection/abscess.  Multiple loops of small bowel are present immediately beneath the anterior abdominal wall incision. No extraluminal contrast is seen.  Treatments: IV hydration, antibiotics: Zosyn, cardiac meds: metoprolol, anticoagulation: LMW heparin, insulin: sliding scale per TPN protocol, TPN, therapies: PT, procedures: PICC line and surgery: see above, Respiratory therapy  Discharge Exam:   Blood pressure 135/42, pulse 75, temperature 98 F (36.7 C), temperature source Oral, resp. rate 16, height 5\' 5"  (1.651 m), weight 226 lb 3.1 oz (102.6 kg), SpO2 100.00%. General appearance: alert, cooperative and no distress Resp: mildly diminished in the bases, no wheezing, rales, or crackles noted. Cardio: regular rate and rhythm, S1, S2 normal, no murmur, click, rub or gallop GI: abdomen soft, non-tender.  Active bowel sounds in all quadrants.  Abdomen obese.  Wound VAC in place to suction.   Extremities: extremities normal, atraumatic, no cyanosis or edema Skin: Skin color, texture, turgor normal. No rashes or lesions Incision/Wound: Midline incision open with wound VAC in place   No change in physical examination from 08/14/12 to 08/15/12  Disposition: Discharged to home with home health services for wound [facility] management      Discharge Orders    Future Appointments: Provider: Department: Dept Phone: Center:   08/24/2012 10:50 AM Wilmon Arms. Corliss Skains, MD [facility], [place] [phone] None     Future Orders Please Complete By Expires   Diet - low sodium  heart healthy      Increase activity slowly      Driving Restrictions      Comments:   No driving for 2 weeks.  Do not take narcotics and drive.    Lifting restrictions      Comments:   No lifting greater than 10 lbs.   Sexual Activity Restrictions      Comments:   No sexual activity, nothing in the vagina, for 6 weeks.   Call MD for:  temperature >100.4      Call MD for:  persistant nausea and vomiting      Call MD for:  severe uncontrolled pain      Call MD for:  redness, tenderness, or signs of infection (pain, swelling, redness, odor or green/yellow discharge around incision site)      Call MD for:  difficulty breathing, headache or visual disturbances      Call MD for:  hives      Call MD for:  persistant dizziness or light-headedness      Call MD for:  extreme fatigue          Medication List     As of 08/15/2012  9:51 AM    STOP taking these medications         enoxaparin 40 MG/0.4ML injection   Commonly known as: LOVENOX      TAKE these medications         colchicine 0.6 MG tablet   Take 1 tablet (0.6 mg total) by mouth daily.      folic acid 1 MG tablet   Commonly known as: FOLVITE   Take 1 tablet (1 mg total) by mouth daily.      naproxen 500 MG tablet   Commonly known as: NAPROSYN   Take 500 mg by mouth 2 (two) times daily as needed. For gout pain      olmesartan-hydrochlorothiazide 20-12.5 MG per tablet   Commonly known as: BENICAR HCT   Take 1 tablet by mouth every morning.      omeprazole 20 MG capsule   Commonly known as: PRILOSEC   Take 20 mg by mouth every morning.      oxyCODONE-acetaminophen 5-325 MG per tablet   Commonly known as: PERCOCET/ROXICET   Take 1-2 tablets by mouth every 4 (four) hours as needed for pain (moderate to severe pain (when tolerating fluids)).      pravastatin 40 MG tablet   Commonly known as: PRAVACHOL   Take 40 mg by mouth every morning.         Follow-up Information    Follow up with Wynona Luna., MD. In 7 days. (Our office will call you with your appointment day and time)    Contact information:   [address] [place]  [address] [phone]       Follow up with Laurette Schimke, MD PHD. On 08/21/2012. (Arrive at 3:30pm to the [facility] to register for a 4:00pm appointment with Dr. Nelly Rout.)    Contact information:   [address] [place] [address] [phone]       Follow up with Roseanna Rainbow, MD. (Please call and arrange a follow up appointment with Dr. Tamela Oddi in 2 weeks)    Contact information:   [address] [place] [address] [phone]          Signed: Warner Mccreedy DEAL 08/15/2012, 9:51 AM

## 2012-08-14 NOTE — Progress Notes (Signed)
Patient ID: Marissa [name], female   DOB: [birth year], 71 y.o.   MRN: [id number] 12 Days Post-Op  Subjective: Doing well overall, tolerating diet, +BMs and flatus, no pain, feels ready to go home  Objective: Vital signs in last 24 hours: Temp:  [97.5 F (36.4 C)-98.6 F (37 C)] 98.6 F (37 C) (12/03 0539) Pulse Rate:  [74-78] 78  (12/03 0539) Resp:  [16-18] 18  (12/03 0539) BP: (121-129)/(78-86) 129/84 mmHg (12/03 0539) SpO2:  [100 %] 100 % (12/03 0539) Last BM Date: 08/12/12  Intake/Output from previous day: 12/02 0701 - 12/03 0700 In: 120 [P.O.:120] Out: 1600 [Urine:1450; Drains:150] Intake/Output this shift:    PE: Abd: soft, non tender, +BS, wound vac in place  Lab Results:   [facility] 08/13/12 0544  WBC 6.8  HGB 7.6*  HCT 24.6*  PLT 288   BMET  Basename 08/12/12 0428  NA 133*  K 4.4  CL 105  CO2 21  GLUCOSE 116*  BUN 26*  CREATININE 1.45*  CALCIUM 9.2   PT/INR No results found for this basename: LABPROT:2,INR:2 in the last 72 hours CMP     Component Value Date/Time   NA 133* 08/12/2012 0428   K 4.4 08/12/2012 0428   CL 105 08/12/2012 0428   CO2 21 08/12/2012 0428   GLUCOSE 116* 08/12/2012 0428   BUN 26* 08/12/2012 0428   CREATININE 1.45* 08/12/2012 0428   CREATININE 1.23* 08/24/2011 1133   CALCIUM 9.2 08/12/2012 0428   PROT 7.6 08/09/2012 0559   ALBUMIN 2.5* 08/09/2012 0559   AST 23 08/09/2012 0559   ALT 22 08/09/2012 0559   ALKPHOS 78 08/09/2012 0559   BILITOT 0.3 08/09/2012 0559   GFRNONAA 35* 08/12/2012 0428   GFRAA 41* 08/12/2012 0428   Lipase     Component Value Date/Time   LIPASE 11 05/22/2008 1845       Studies/Results: No results found.  Anti-infectives: Anti-infectives     Start     Dose/Rate Route Frequency Ordered Stop   08/02/12 2200   piperacillin-tazobactam (ZOSYN) IVPB 3.375 g  Status:  Discontinued        3.375 g 12.5 mL/hr over 240 Minutes Intravenous Every 8 hours 08/02/12 1655 08/11/12 1309   08/01/12 2359    piperacillin-tazobactam (ZOSYN) IVPB 2.25 g  Status:  Discontinued        2.25 g 100 mL/hr over 30 Minutes Intravenous 3 times per day 08/01/12 2330 08/02/12 1651           Assessment/Plan 1. POD#11-exp lap: doing well overall, getting stronger, tolerating regular diet and bowel function good.  --care management consult for home VAC, needs KCI vac for Marissa [name] foam.  --will benefit from homehealth PT for 2-3 weeks  --ok to d/c home from surgical standpoint, discharge instructions and follow up updated in discharge planning.  LOS: 13 days    Marissa [name] 08/14/2012

## 2012-08-14 NOTE — Progress Notes (Signed)
Patient continues to refuse nocturnal CPAP.  

## 2012-08-15 LAB — GLUCOSE, CAPILLARY
Glucose-Capillary: 94 mg/dL (ref 70–99)
Glucose-Capillary: 105 mg/dL — ABNORMAL HIGH (ref 70–99)
Glucose-Capillary: 83 mg/dL (ref 70–99)

## 2012-08-15 NOTE — Progress Notes (Signed)
Nutrition Brief Note  Diet: Low sodium, heart healthy, 75% meal intake  - TPN d/c 12/1. Met with pt who reports she has been learning how to eat again as pt with poor intake for a while PTA. Discussed importance of nutrition with healing and recovery. Pt not interested in nutritional supplements. Used teach back method to educate pt on high calorie/protein nutrition therapy and provided handouts of information. Pt expressed understanding. Pt states she will eat better at discharge. Noted plans to d/c tomorrow. Pt eating well, nutrition signing off.  Levon Hedger MS, RD, LDN [phone] Pager [phone] After Hours Pager

## 2012-08-15 NOTE — Progress Notes (Signed)
Patient ID: BRADIE SANGIOVANNI, female   DOB: 1941/02/20, 71 y.o.   MRN: [id number] 13 Days Post-Op  Subjective: Doing well overall, tolerating diet, +BMs and flatus, no pain, unable to discharge yesterday as KCI vac was not ready  Objective: Vital signs in last 24 hours: Temp:  [97.7 F (36.5 C)-98.5 F (36.9 C)] 98 F (36.7 C) (12/04 0618) Pulse Rate:  [75-78] 75  (12/04 0618) Resp:  [16-18] 16  (12/03 2132) BP: (135-168)/(42-83) 135/42 mmHg (12/04 0618) SpO2:  [99 %-100 %] 100 % (12/04 0618) Last BM Date: 08/13/12  Intake/Output from previous day: 12/03 0701 - 12/04 0700 In: 320 [P.O.:320] Out: 300 [Urine:300] Intake/Output this shift:    PE: Abd: soft, non tender, +BS, wound vac in place  Lab Results:   Basename 08/13/12 0544  WBC 6.8  HGB 7.6*  HCT 24.6*  PLT 288   BMET No results found for this basename: NA:2,K:2,CL:2,CO2:2,GLUCOSE:2,BUN:2,CREATININE:2,CALCIUM:2 in the last 72 hours PT/INR No results found for this basename: LABPROT:2,INR:2 in the last 72 hours CMP     Component Value Date/Time   NA 133* 08/12/2012 0428   K 4.4 08/12/2012 0428   CL 105 08/12/2012 0428   CO2 21 08/12/2012 0428   GLUCOSE 116* 08/12/2012 0428   BUN 26* 08/12/2012 0428   CREATININE 1.45* 08/12/2012 0428   CREATININE 1.23* 08/24/2011 1133   CALCIUM 9.2 08/12/2012 0428   PROT 7.6 08/09/2012 0559   ALBUMIN 2.5* 08/09/2012 0559   AST 23 08/09/2012 0559   ALT 22 08/09/2012 0559   ALKPHOS 78 08/09/2012 0559   BILITOT 0.3 08/09/2012 0559   GFRNONAA 35* 08/12/2012 0428   GFRAA 41* 08/12/2012 0428   Lipase     Component Value Date/Time   LIPASE 11 05/22/2008 1845       Studies/Results: No results found.  Anti-infectives: Anti-infectives     Start     Dose/Rate Route Frequency Ordered Stop   08/02/12 2200   piperacillin-tazobactam (ZOSYN) IVPB 3.375 g  Status:  Discontinued        3.375 g 12.5 mL/hr over 240 Minutes Intravenous Every 8 hours 08/02/12 1655 08/11/12 1309   08/01/12 2359   piperacillin-tazobactam (ZOSYN) IVPB 2.25 g  Status:  Discontinued        2.25 g 100 mL/hr over 30 Minutes Intravenous 3 times per day 08/01/12 2330 08/02/12 1651           Assessment/Plan 1. POD#12-exp lap: doing well overall, getting stronger, tolerating regular diet and bowel function good.  --home today after VAC set up  --will benefit from homehealth PT for 2-3 weeks  --ok to d/c home from surgical standpoint, discharge instructions and follow up updated in discharge planning.  LOS: 14 days    WHITE, ELIZABETH 08/15/2012

## 2012-08-15 NOTE — Progress Notes (Signed)
Continue to await insurance approval for wound vac, as of this morning approval still pending. Will continue to follow.

## 2012-08-21 ENCOUNTER — Ambulatory Visit: Payer: Medicare Other | Admitting: Gynecologic Oncology

## 2012-08-24 ENCOUNTER — Encounter (INDEPENDENT_AMBULATORY_CARE_PROVIDER_SITE_OTHER): Payer: Self-pay | Admitting: Surgery

## 2012-08-24 ENCOUNTER — Ambulatory Visit (INDEPENDENT_AMBULATORY_CARE_PROVIDER_SITE_OTHER): Payer: Medicare Other | Admitting: Surgery

## 2012-08-24 VITALS — BP 134/78 | HR 76 | Temp 98.1°F | Resp 18 | Ht 65.0 in | Wt 220.1 lb

## 2012-08-24 DIAGNOSIS — T8131XA Disruption of external operation (surgical) wound, not elsewhere classified, initial encounter: Secondary | ICD-10-CM

## 2012-08-24 NOTE — Progress Notes (Signed)
This patient is status post hysterectomyIn mid-November. She developed a wound he has since as well as a fascial dehiscence. She actually had some foul-smelling drainage that was suspicious for succus entericus. She underwent emergent exploration on 08/02/12. We could not locate the area of the fistula. However the small bowel was exposed. There was too much inflammation and scarring to enter her peritoneal cavity. CT scan showed no other signs of infection or abscess. The wound was treated with antibiotics and a VAC sponge. The patient was discharged home with a VAC. She comes in today for a wound check. She states that she is due a well. Appetite and bowel movements are good. She has not looked at the wound herself. Home health has been doing Monday Wednesday Friday dressing changes.  Filed Vitals:   08/24/12 1019  BP: 134/78  Pulse: 76  Temp: 98.1 F (36.7 C)  Resp: 18    Abdomen soft and nontender. Her back was removed. The wound is almost completely healed. It is filled with granulation tissue up to the level of the skin. She has a strip of granulation tissue about 1/2 cm wide by about 6 cm long.  She may stop using the VAC. She can treat this wound with a daily wet-to-dry dressing. Followup in 3-4 weeks. We sent a prescription with the patient and will also try to notify [facility].  Wilmon Arms. Corliss Skains, MD, [facility] Surgery  08/24/2012 11:03 AM

## 2012-08-28 ENCOUNTER — Ambulatory Visit: Payer: Medicare Other | Admitting: Gynecologic Oncology

## 2012-08-28 ENCOUNTER — Ambulatory Visit: Payer: Medicare Other | Attending: Gynecologic Oncology | Admitting: Gynecologic Oncology

## 2012-09-14 ENCOUNTER — Encounter (INDEPENDENT_AMBULATORY_CARE_PROVIDER_SITE_OTHER): Payer: Medicare Other | Admitting: Surgery

## 2012-09-18 ENCOUNTER — Ambulatory Visit: Payer: Medicare Other | Attending: Gynecologic Oncology | Admitting: Gynecologic Oncology

## 2012-09-19 ENCOUNTER — Encounter: Payer: Self-pay | Admitting: Gynecologic Oncology

## 2012-09-24 ENCOUNTER — Ambulatory Visit (INDEPENDENT_AMBULATORY_CARE_PROVIDER_SITE_OTHER): Payer: Medicare Other | Admitting: Surgery

## 2012-09-24 ENCOUNTER — Encounter (INDEPENDENT_AMBULATORY_CARE_PROVIDER_SITE_OTHER): Payer: Self-pay | Admitting: Surgery

## 2012-09-24 VITALS — BP 138/84 | HR 64 | Temp 97.6°F | Resp 18 | Ht 64.0 in | Wt 218.2 lb

## 2012-09-24 DIAGNOSIS — T8131XA Disruption of external operation (surgical) wound, not elsewhere classified, initial encounter: Secondary | ICD-10-CM

## 2012-09-24 NOTE — Progress Notes (Signed)
The patient comes back for a wound recheck. The wound is almost completely healed. There is a 1 cm area of granulation tissue that is cauterized with silver nitrate. She may treat this area with Neosporin and a dry dressing until completely healed. Appetite and bowel movements are normal. She may followup with [place] as needed.  Wilmon Arms. Corliss Skains, MD, [facility] Surgery  09/24/2012 9:24 AM

## 2012-12-21 ENCOUNTER — Other Ambulatory Visit: Payer: Self-pay | Admitting: Cardiology

## 2013-01-17 ENCOUNTER — Other Ambulatory Visit: Payer: Self-pay | Admitting: Cardiology

## 2013-02-01 ENCOUNTER — Other Ambulatory Visit: Payer: Self-pay | Admitting: Cardiology

## 2013-02-26 ENCOUNTER — Ambulatory Visit (INDEPENDENT_AMBULATORY_CARE_PROVIDER_SITE_OTHER): Payer: Medicare Other | Admitting: Cardiology

## 2013-02-26 ENCOUNTER — Encounter: Payer: Self-pay | Admitting: Cardiology

## 2013-02-26 ENCOUNTER — Encounter: Payer: Self-pay | Admitting: Obstetrics

## 2013-02-26 VITALS — BP 160/100 | HR 77 | Ht 64.0 in | Wt 228.0 lb

## 2013-02-26 DIAGNOSIS — R0989 Other specified symptoms and signs involving the circulatory and respiratory systems: Secondary | ICD-10-CM

## 2013-02-26 DIAGNOSIS — R0600 Dyspnea, unspecified: Secondary | ICD-10-CM

## 2013-02-26 DIAGNOSIS — I2510 Atherosclerotic heart disease of native coronary artery without angina pectoris: Secondary | ICD-10-CM

## 2013-02-26 DIAGNOSIS — I10 Essential (primary) hypertension: Secondary | ICD-10-CM

## 2013-02-26 DIAGNOSIS — E785 Hyperlipidemia, unspecified: Secondary | ICD-10-CM

## 2013-02-26 MED ORDER — AMLODIPINE BESYLATE 10 MG PO TABS: 10.0000 mg | ORAL_TABLET | Freq: Every day | ORAL | Status: DC

## 2013-02-26 MED ORDER — NITROGLYCERIN 0.4 MG SL SUBL: 0.4000 mg | SUBLINGUAL_TABLET | SUBLINGUAL | Status: DC | PRN

## 2013-02-26 MED ORDER — OLMESARTAN MEDOXOMIL-HCTZ 20-12.5 MG PO TABS: 1.0000 | ORAL_TABLET | Freq: Every morning | ORAL | Status: DC

## 2013-02-26 MED ORDER — ATORVASTATIN CALCIUM 20 MG PO TABS: 20.0000 mg | ORAL_TABLET | Freq: Every day | ORAL | Status: DC

## 2013-02-26 NOTE — Progress Notes (Signed)
Patient ID: Marissa [name], female   DOB: 1941-04-21, 72 y.o.   MRN: [id number] PCP: Dr. Yetta Barre  72 yo with history of CAD and HTN presents for cardiology followup.  She has a history of intervention on an OM.  Last echo in 2/13 showed preserved EF.  Since last appointment, she had TAH-BSO due to vaginal bleeding from leiomyomas in 11/13.  This was complicated by wound infection and fascial dehiscence.  Her abdominal wound has healed.   Patient reports increased exertional dyspnea.  She is short of breath after walking 50-100 feet.  Sometimes she is short of breath just walking around her house.  She also has been having chest pain episodes for the last 2 wks.  These have no particular trigger.  They last about 2-3 minutes at a time and occur around twice a day.  No exertional chest pain.  BP is high today.  Patient says she has been out of her medications for about 2 months now.  She does not take aspirin.  She says that she "bleeds into my eye" every time she takes aspirin and was told not to take it by an "eye doctor."   Labs (1/13): LDL 133, HDL 48, HCT 36.3, K 4.1, creatinine 1.23 => 1.4, BNP 58 Labs (12/13): K 4.4, creatinine 1.45  PMH: 1. Anemia 2. Diabetes: diet-controlled. 3. HTN 4. GERD 5. History of diverticulitis 6. Hyperlipidemia 7. Schatzski's ring 8. Fibroids: s/p TAH-BSO in 11/13 for vaginal bleeding.  9. CAD: 8/07 had BMS to OM.  In-stent restenosis with later Promus DES to same site.  Lexiscan myoview in 1/13 showed EF 67%, no ischemia or infarction.  Echo (2/13) showed EF 55-60%, moderate LVH, mild MR.   10. Peripheral neuropathy 11. HTN 12. CKD  SH: Single with 1 daughter.  She does not smoke.   FH: No premature CAD  ROS: All systems reviewed and negative except as per HPI.   Current Outpatient Prescriptions  Medication Sig Dispense Refill  . amLODipine (NORVASC) 10 MG tablet Take 1 tablet (10 mg total) by mouth daily.  30 tablet  6  . colchicine 0.6 MG tablet Take 1  tablet (0.6 mg total) by mouth daily.  180 tablet  3  . nitroGLYCERIN (NITROSTAT) 0.4 MG SL tablet Place 1 tablet (0.4 mg total) under the tongue every 5 (five) minutes as needed for chest pain.  100 tablet  3  . olmesartan-hydrochlorothiazide (BENICAR HCT) 20-12.5 MG per tablet Take 1 tablet by mouth every morning.  30 tablet  6  . omeprazole (PRILOSEC) 20 MG capsule TAKE ONE CAPSULE BY MOUTH EVERY DAY  30 capsule  0  . oxyCODONE-acetaminophen (PERCOCET/ROXICET) 5-325 MG per tablet Take 1-2 tablets by mouth every 4 (four) hours as needed for pain (moderate to severe pain (when tolerating fluids)).  40 tablet  0  . aspirin EC 81 MG tablet 1 tablet every other day      . atorvastatin (LIPITOR) 20 MG tablet Take 1 tablet (20 mg total) by mouth daily.  30 tablet  3   No current facility-administered medications for this visit.    BP 160/100  Pulse 77  Ht 5\' 4"  (1.626 m)  Wt 228 lb (103.42 kg)  BMI 39.12 kg/m2 General: NAD, obese. Neck: No JVD, no thyromegaly or thyroid nodule.  Lungs: Clear to auscultation bilaterally with normal respiratory effort. CV: Nondisplaced PMI.  Heart regular S1/S2, no S3/S4, no murmur.  No peripheral edema.  No carotid bruit.  2+ DP pulses  bilaterally.  Abdomen: Soft, nontender, no hepatosplenomegaly, no distention.  Neurologic: Alert and oriented x 3.  Psych: Normal affect. Extremities: No clubbing or cyanosis.   Assessment/Plan: 1. HTN: BP is high, patient is off her medications.  I will have her restart on amlodipine 10 mg daily and olmesartan/HCTZ 20/12.5 daily.  If the olmesartan/HCTZ proves too expensive, it can be changed, but she was taking this in the past.  Check BMET about a week after starting meds.  2. Hyperlipidemia: History of CAD.  Restart statin, will have her use atorvastatin 20 mg daily.  Check lipids/LFTs in 2 months.  3. CAD: Patient has a history of CAD with OM PCI.  Patient has developed NYHA class III exertional dyspnea symptoms and has  atypical chest pain.  She does not appear volume overloaded on exam.  - Lexiscan-Sestamibi to assess for ischemia (does not think she can walk on the treadmill).  - Echo to assess LV systolic function and will check BNP.  - Restart statin. - Start ASA 81 every other day.  She is willing to try every other day dosing of ASA.  4. Followup with me in 3 months and PA in 2 wks.   Marca Ancona 02/26/2013

## 2013-02-26 NOTE — Patient Instructions (Addendum)
Your physician has requested that you have an echocardiogram. Echocardiography is a painless test that uses sound waves to create images of your heart. It provides your doctor with information about the size and shape of your heart and how well your heart's chambers and valves are working. This procedure takes approximately one hour. There are no restrictions for this procedure.  Your physician has requested that you have a lexiscan myoview. For further information please visit [email or web address] Please follow instruction sheet, as given.  Start aspirin 81mg  every other day.  Take atorvastatin 20mg  daily for your cholesterol.  Your physician recommends that you schedule a follow-up appointment in: 2 weeks with the PA/NP.   Your physician recommends that you return for lab work in: 2 weeks when you see the PA/NP   Your physician recommends that you return for a FASTING lipid profile /liver profile in 2 months.  Your physician recommends that you schedule a follow-up appointment in: 3 months with Dr Shirlee Latch.

## 2013-03-01 ENCOUNTER — Ambulatory Visit (HOSPITAL_COMMUNITY): Payer: Medicare Other | Attending: Cardiology | Admitting: Radiology

## 2013-03-01 DIAGNOSIS — R0989 Other specified symptoms and signs involving the circulatory and respiratory systems: Secondary | ICD-10-CM | POA: Insufficient documentation

## 2013-03-01 DIAGNOSIS — E785 Hyperlipidemia, unspecified: Secondary | ICD-10-CM | POA: Insufficient documentation

## 2013-03-01 DIAGNOSIS — I2510 Atherosclerotic heart disease of native coronary artery without angina pectoris: Secondary | ICD-10-CM | POA: Insufficient documentation

## 2013-03-01 DIAGNOSIS — R0600 Dyspnea, unspecified: Secondary | ICD-10-CM

## 2013-03-01 DIAGNOSIS — N189 Chronic kidney disease, unspecified: Secondary | ICD-10-CM | POA: Insufficient documentation

## 2013-03-01 DIAGNOSIS — Z8673 Personal history of transient ischemic attack (TIA), and cerebral infarction without residual deficits: Secondary | ICD-10-CM | POA: Insufficient documentation

## 2013-03-01 DIAGNOSIS — G4733 Obstructive sleep apnea (adult) (pediatric): Secondary | ICD-10-CM | POA: Insufficient documentation

## 2013-03-01 DIAGNOSIS — G609 Hereditary and idiopathic neuropathy, unspecified: Secondary | ICD-10-CM | POA: Insufficient documentation

## 2013-03-01 DIAGNOSIS — E119 Type 2 diabetes mellitus without complications: Secondary | ICD-10-CM | POA: Insufficient documentation

## 2013-03-01 DIAGNOSIS — E669 Obesity, unspecified: Secondary | ICD-10-CM | POA: Insufficient documentation

## 2013-03-01 DIAGNOSIS — I129 Hypertensive chronic kidney disease with stage 1 through stage 4 chronic kidney disease, or unspecified chronic kidney disease: Secondary | ICD-10-CM | POA: Insufficient documentation

## 2013-03-01 DIAGNOSIS — R0609 Other forms of dyspnea: Secondary | ICD-10-CM | POA: Insufficient documentation

## 2013-03-01 DIAGNOSIS — R0602 Shortness of breath: Secondary | ICD-10-CM

## 2013-03-01 NOTE — Progress Notes (Signed)
Echocardiogram performed.  

## 2013-03-05 ENCOUNTER — Ambulatory Visit (HOSPITAL_COMMUNITY): Payer: Medicare Other | Attending: Cardiology | Admitting: Radiology

## 2013-03-05 VITALS — BP 128/75 | HR 67 | Ht 64.0 in | Wt 224.0 lb

## 2013-03-05 DIAGNOSIS — I2510 Atherosclerotic heart disease of native coronary artery without angina pectoris: Secondary | ICD-10-CM

## 2013-03-05 DIAGNOSIS — R079 Chest pain, unspecified: Secondary | ICD-10-CM | POA: Insufficient documentation

## 2013-03-05 DIAGNOSIS — I499 Cardiac arrhythmia, unspecified: Secondary | ICD-10-CM | POA: Insufficient documentation

## 2013-03-05 DIAGNOSIS — R51 Headache: Secondary | ICD-10-CM

## 2013-03-05 DIAGNOSIS — E785 Hyperlipidemia, unspecified: Secondary | ICD-10-CM

## 2013-03-05 DIAGNOSIS — R0989 Other specified symptoms and signs involving the circulatory and respiratory systems: Secondary | ICD-10-CM | POA: Insufficient documentation

## 2013-03-05 DIAGNOSIS — Z9861 Coronary angioplasty status: Secondary | ICD-10-CM | POA: Insufficient documentation

## 2013-03-05 DIAGNOSIS — Z8249 Family history of ischemic heart disease and other diseases of the circulatory system: Secondary | ICD-10-CM | POA: Insufficient documentation

## 2013-03-05 DIAGNOSIS — I252 Old myocardial infarction: Secondary | ICD-10-CM | POA: Insufficient documentation

## 2013-03-05 DIAGNOSIS — R0600 Dyspnea, unspecified: Secondary | ICD-10-CM

## 2013-03-05 DIAGNOSIS — R0609 Other forms of dyspnea: Secondary | ICD-10-CM | POA: Insufficient documentation

## 2013-03-05 DIAGNOSIS — R0602 Shortness of breath: Secondary | ICD-10-CM

## 2013-03-05 DIAGNOSIS — R002 Palpitations: Secondary | ICD-10-CM | POA: Insufficient documentation

## 2013-03-05 MED ORDER — AMINOPHYLLINE 25 MG/ML IV SOLN
75.0000 mg | Freq: Once | INTRAVENOUS | Status: AC
  Administered 2013-03-05: 75 mg via INTRAVENOUS

## 2013-03-05 MED ORDER — TECHNETIUM TC 99M SESTAMIBI GENERIC - CARDIOLITE
33.0000 | Freq: Once | INTRAVENOUS | Status: AC | PRN
  Administered 2013-03-05: 33 via INTRAVENOUS

## 2013-03-05 MED ORDER — REGADENOSON 0.4 MG/5ML IV SOLN
0.4000 mg | Freq: Once | INTRAVENOUS | Status: AC
  Administered 2013-03-05: 0.4 mg via INTRAVENOUS

## 2013-03-05 MED ORDER — TECHNETIUM TC 99M SESTAMIBI GENERIC - CARDIOLITE
11.0000 | Freq: Once | INTRAVENOUS | Status: AC | PRN
  Administered 2013-03-05: 11 via INTRAVENOUS

## 2013-03-05 NOTE — Progress Notes (Addendum)
[facility] SITE 3 NUCLEAR MED [address] [place], [place] [address] [phone]    Cardiology Nuclear Med Study  Marissa [name] is a 72 y.o. female     MRN : [id number]     DOB: February 18, 1941  Procedure Date: 03/05/2013  Nuclear Med Background Indication for Stress Test:  Evaluation for Ischemia, Stent Patency and PTCA Patency History:  '09 MI; '09 PTCA/Stents x 2 CFX; 04/2008 Cath: Patent Stents, EF=60%, '13 Myocardial Perfusion Study-No ischemia, EF=67%; and 03-01-13 Echo: mild LVH EF=55-60% Cardiac Risk Factors: Strong, Premature Family History - CAD, Hypertension and Lipids  Symptoms: Chest Pain with/without exertion (last occurrence yesterday), DOE, Fatigue, Fatigue with Exertion, Palpitations, Rapid HR and SOB   Nuclear Pre-Procedure Caffeine/Decaff Intake:  None NPO After: 6:00pm   Lungs:  clear O2 Sat: 98% on room air. IV 0.9% NS with Angio Cath:  22g  IV Site: R Forearm  IV Started by:  Stanton Kidney, EMT-P  Chest Size (in):  40 Cup Size: C  Height: 5\' 4"  (1.626 m)  Weight:  224 lb (101.606 kg)  BMI:  Body mass index is 38.43 kg/(m^2). Tech Comments:  Meds as directed. Aminophylline 75 mg IVP given post recovery due to persistent headache and fatigue with quick resolution of symptoms. Irean Hong, RN     Nuclear Med Study 1 or 2 day study: 1 day  Stress Test Type:  Eugenie Birks  Reading MD: Olga Millers, MD  Order Authorizing Provider:  Marca Ancona, MD  Resting Radionuclide: Technetium 18m Sestamibi  Resting Radionuclide Dose: 11.0 mCi   Stress Radionuclide:  Technetium 37m Sestamibi  Stress Radionuclide Dose: 33.0 mCi           Stress Protocol Rest HR: 67 Stress HR: 94  Rest BP: 128/75 Stress BP: 151/83  Exercise Time (min): n/a METS: n/a   Predicted Max HR: 148 bpm % Max HR: 63.51 bpm Rate Pressure Product: 21308   Dose of Adenosine (mg):  n/a Dose of Lexiscan: 0.4 mg  Dose of Atropine (mg): n/a Dose of Dobutamine: n/a mcg/kg/min (at max HR)    Stress Test Technologist: Irean Hong, RN  Nuclear Technologist:  Domenic Polite, CNMT     Rest Procedure:  Myocardial perfusion imaging was performed at rest 45 minutes following the intravenous administration of Technetium 12m Sestamibi. Rest ECG: NSR - Normal EKG  Stress Procedure:  The patient received IV Lexiscan 0.4 mg over 15-seconds.  Technetium 11m Sestamibi injected at 30-seconds. The patient complained of right sided chest pain, headache, and marked fatigue with lexiscan.  Quantitative spect images were obtained after a 45 minute delay. Stress ECG: No significant ST segment change suggestive of ischemia.  QPS Raw Data Images:  Acquisition technically good; normal left ventricular size. Stress Images:  Normal homogeneous uptake in all areas of the myocardium. Rest Images:  Normal homogeneous uptake in all areas of the myocardium. Subtraction (SDS):  No evidence of ischemia. Transient Ischemic Dilatation (Normal <1.22):  1.16 Lung/Heart Ratio (Normal <0.45):  0.32  Quantitative Gated Spect Images QGS EDV:  82 ml QGS ESV:  39 ml  Impression Exercise Capacity:  Lexiscan with no exercise. BP Response:  Normal blood pressure response. Clinical Symptoms:  There is chest tightness. ECG Impression:  No significant ST segment change suggestive of ischemia. Comparison with Prior Nuclear Study: No significant change compared to study of 10/18/11.  Overall Impression:  Normal stress nuclear study.  LV Ejection Fraction: 53%.  LV Wall Motion:  NL LV Function; NL  Wall Motion  Olga Millers  Normal study, please inform patient.   Marca Ancona 03/06/2013

## 2013-03-06 NOTE — Progress Notes (Signed)
Notified. 

## 2013-03-18 ENCOUNTER — Ambulatory Visit (INDEPENDENT_AMBULATORY_CARE_PROVIDER_SITE_OTHER): Payer: Medicare Other | Admitting: Physician Assistant

## 2013-03-18 ENCOUNTER — Other Ambulatory Visit: Payer: Medicare Other

## 2013-03-18 ENCOUNTER — Encounter: Payer: Self-pay | Admitting: Physician Assistant

## 2013-03-18 VITALS — BP 110/70 | HR 75 | Ht 64.0 in | Wt 228.0 lb

## 2013-03-18 DIAGNOSIS — E785 Hyperlipidemia, unspecified: Secondary | ICD-10-CM

## 2013-03-18 DIAGNOSIS — I2510 Atherosclerotic heart disease of native coronary artery without angina pectoris: Secondary | ICD-10-CM

## 2013-03-18 DIAGNOSIS — R0602 Shortness of breath: Secondary | ICD-10-CM

## 2013-03-18 DIAGNOSIS — I10 Essential (primary) hypertension: Secondary | ICD-10-CM

## 2013-03-18 DIAGNOSIS — G4733 Obstructive sleep apnea (adult) (pediatric): Secondary | ICD-10-CM

## 2013-03-18 LAB — BASIC METABOLIC PANEL
BUN: 33 mg/dL — ABNORMAL HIGH (ref 6–23)
Calcium: 9.5 mg/dL (ref 8.4–10.5)
Chloride: 108 mEq/L (ref 96–112)
Creatinine, Ser: 1.6 mg/dL — ABNORMAL HIGH (ref 0.4–1.2)
GFR: 42.23 mL/min — ABNORMAL LOW (ref >60.00)

## 2013-03-18 NOTE — Progress Notes (Signed)
[address] [place], [place]  [address] Phone: [phone] Fax:  [phone]  Date:  03/18/2013   ID:  Marissa [name], DOB 06-21-1941, MRN [id number]  PCP:  Sanda Linger, MD  Cardiologist:  Dr. Marca Ancona     History of Present Illness: Marissa [name] is a 72 y.o. female who returns for f/u.  She has a hx of CAD, s/p PCI to OM and HTN.  Last echo in 2/13 showed preserved EF. She had TAH-BSO in 07/2012 due to vaginal bleeding from leiomyomas that was complicated by wound infection and fascial dehiscence. Her abdominal wound has since healed.  She saw Dr. Marca Ancona in f/u on 6/17 and noted DOE and CP.  BP was uncontrolled.  She was off her medications.  These were restarted.  Statin was restarted as well.  Echo and stress test was arranged.  Echo 03/01/13: mild LVH, EF 55-60%, Gr 1 DD, MAD, mild MR, mild LAE.  Lexiscan Myoview 03/05/13: normal, no ischemia, EF 53%.    She continues to note DOE as well as chest discomfort.  She feels tight in her chest.  Symptoms are no better and are no worse.  She probably describes NYHA Class IIb symptoms.  No syncope.  She sleeps on 2-3 pillows chronically.  She is not using her CPAP. States the machine is not working correctly.  She describes episodes of ?PND vs apnea.  She has some pedal edema.  No syncope.  She states she is sleepy all the time.  Also, does not sleep very well.    Labs (1/13):   LDL 133, HDL 48, HCT 36.3, K 4.1, creatinine 1.23 => 1.4, BNP 58  Labs (12/13): K 4.4, creatinine 1.45   Wt Readings from Last 3 Encounters:  03/18/13 228 lb (103.42 kg)  03/05/13 224 lb (101.606 kg)  02/26/13 228 lb (103.42 kg)     Past Medical History  Diagnosis Date  . Hypertension   . Renal insufficiency     baseline Cr ~ 1.3  . Hyperlipidemia   . History of DVT (deep vein thrombosis)   . History of colonic polyps   . Gout   . Polyarthritis   . Microcytic anemia   . OSA (obstructive sleep apnea)   . GERD  (gastroesophageal reflux disease)   . Esophagitis   . Hidradenitis suppurativa     s/p axillary sweat gland removal  . H/O blood transfusion reaction     at [facility] hospital  . Headache(784.0)   . Diverticulitis   . Difficulty sleeping   . PMB (postmenopausal bleeding)     X 2 YRS  . Bowel trouble     OCCASIONAL BOWEL INCONTINENCE  . Diabetes mellitus without complication     diet controlled  . Fibroids     s/p TAH/BSO 07/2012  . CAD (coronary artery disease)     a. 8/07 had BMS to OM.  b. In-stent restenosis with later Promus DES to same site.  c. Lexiscan myoview in 1/13 showed EF 67%, no ischemia or infarction.  d. Echo (2/13) showed EF 55-60%, moderate LVH, mild MR.  e. Lex MV 6/14: no isch, EF 53%;  f. Echo 6/14: mild LVH, EF 55-60%, Gr 1 DD, MAC, mild MR, mild LAE  . Peripheral neuropathy     Current Outpatient Prescriptions  Medication Sig Dispense Refill  . amLODipine (NORVASC) 10 MG tablet Take 1 tablet (10 mg total) by mouth daily.  30 tablet  6  .  aspirin EC 81 MG tablet 1 tablet every other day      . atorvastatin (LIPITOR) 20 MG tablet Take 1 tablet (20 mg total) by mouth daily.  30 tablet  3  . colchicine 0.6 MG tablet Take 1 tablet (0.6 mg total) by mouth daily.  180 tablet  3  . nitroGLYCERIN (NITROSTAT) 0.4 MG SL tablet Place 1 tablet (0.4 mg total) under the tongue every 5 (five) minutes as needed for chest pain.  100 tablet  3  . olmesartan-hydrochlorothiazide (BENICAR HCT) 20-12.5 MG per tablet Take 1 tablet by mouth every morning.  30 tablet  6  . oxyCODONE-acetaminophen (PERCOCET/ROXICET) 5-325 MG per tablet Take 1-2 tablets by mouth every 4 (four) hours as needed for pain (moderate to severe pain (when tolerating fluids)).  40 tablet  0   No current facility-administered medications for this visit.    Allergies:    Allergies  Allergen Reactions  . Amlodipine Itching    edema    Social History:  The patient  reports that she has never smoked. She has never  used smokeless tobacco. She reports that she does not drink alcohol or use illicit drugs.   ROS:  Please see the history of present illness.      All other systems reviewed and negative.   PHYSICAL EXAM: VS:  BP 110/70  Pulse 75  Ht 5\' 4"  (1.626 m)  Wt 228 lb (103.42 kg)  BMI 39.12 kg/m2 Well nourished, well developed, in no acute distress HEENT: normal Neck: no JVD Cardiac:  normal S1, S2; RRR; no murmur Lungs:  clear to auscultation bilaterally, no wheezing, rhonchi or rales Abd: soft, nontender, no hepatomegaly Ext: no edema Skin: warm and dry Neuro:  CNs 2-12 intact, no focal abnormalities noted  EKG:  NSR, HR 75, NSSTTW changes      ASSESSMENT AND PLAN:  1. Hypertension:  Much better controlled.  Continue current Rx. 2. Hyperlipidemia:  Continue statin.  F/u with labs as planned. 3. CAD:  She continues to have CP.  Recent low risk myoview.  LHC in 2009 demonstrated patent stents and no significant CAD elsewhere.  Continue ASA and statin.  No further cardiac workup at this time.  Symptoms appear to be non-cardiac. 4. Dyspnea:  She does not look volume overloaded on exam.  She does have mild diastolic dysfunction on her echo.  I suspect her dyspnea is related to obesity, deconditioning, extreme fatigue from untreated OSA and possibly diastolic dysfunction.  Will check BMET and BNP today.  If BNP is very high, will change HCTZ to Lasix.   5. OSA:  I have asked her to call [facility] to get her CPAP machine fixed.  We discussed the importance of this.  6. Disposition:  F/u with Dr. Marca Ancona as planned.   Signed, Tereso Newcomer, PA-C  03/18/2013 2:51 PM

## 2013-03-18 NOTE — Patient Instructions (Addendum)
NO CHANGES WITH YOUR MEDICATIONS TODAY  LAB TODAY; BMET, BNP  MAKE SURE TO CALL [facility] ABOUT YOUR CPAP MACHINE  KEEP YOUR APPT WITH DR. Shirlee Latch IN 05/2013

## 2013-03-19 LAB — BRAIN NATRIURETIC PEPTIDE: Pro B Natriuretic peptide (BNP): 63 pg/mL (ref 0.0–100.0)

## 2013-03-20 ENCOUNTER — Telehealth: Payer: Self-pay | Admitting: *Deleted

## 2013-03-20 NOTE — Telephone Encounter (Signed)
pt notified about lab results with verbal understanding  

## 2013-03-20 NOTE — Telephone Encounter (Signed)
Message copied by Tarri Fuller on Wed Mar 20, 2013  8:54 AM ------      Message from: [place], [place] T      Created: Tue Mar 19, 2013  5:36 PM       K+ ok      Creatinine stable      BNP normal - no CHF      Continue with current treatment plan.      Tereso Newcomer, PA-C        03/19/2013 5:36 PM ------

## 2013-03-29 ENCOUNTER — Ambulatory Visit (INDEPENDENT_AMBULATORY_CARE_PROVIDER_SITE_OTHER): Payer: Medicare Other | Admitting: Internal Medicine

## 2013-03-29 ENCOUNTER — Other Ambulatory Visit (INDEPENDENT_AMBULATORY_CARE_PROVIDER_SITE_OTHER): Payer: Medicare Other

## 2013-03-29 ENCOUNTER — Encounter: Payer: Self-pay | Admitting: Internal Medicine

## 2013-03-29 VITALS — BP 120/72 | HR 76 | Temp 97.6°F | Resp 12 | Wt 225.0 lb

## 2013-03-29 DIAGNOSIS — I10 Essential (primary) hypertension: Secondary | ICD-10-CM

## 2013-03-29 DIAGNOSIS — I2510 Atherosclerotic heart disease of native coronary artery without angina pectoris: Secondary | ICD-10-CM

## 2013-03-29 DIAGNOSIS — E785 Hyperlipidemia, unspecified: Secondary | ICD-10-CM

## 2013-03-29 DIAGNOSIS — N259 Disorder resulting from impaired renal tubular function, unspecified: Secondary | ICD-10-CM

## 2013-03-29 DIAGNOSIS — A09 Infectious gastroenteritis and colitis, unspecified: Secondary | ICD-10-CM

## 2013-03-29 DIAGNOSIS — F068 Other specified mental disorders due to known physiological condition: Secondary | ICD-10-CM

## 2013-03-29 DIAGNOSIS — F0390 Unspecified dementia without behavioral disturbance: Secondary | ICD-10-CM | POA: Insufficient documentation

## 2013-03-29 DIAGNOSIS — D529 Folate deficiency anemia, unspecified: Secondary | ICD-10-CM

## 2013-03-29 LAB — IBC PANEL
Iron: 55 ug/dL (ref 42–145)
Saturation Ratios: 16.4 % — ABNORMAL LOW (ref 20.0–50.0)

## 2013-03-29 LAB — CBC WITH DIFFERENTIAL/PLATELET
Basophils Absolute: 0 10*3/uL (ref 0.0–0.1)
Eosinophils Absolute: 0.1 10*3/uL (ref 0.0–0.7)
Lymphocytes Relative: 16.2 % (ref 12.0–46.0)
MCHC: 31.7 g/dL (ref 30.0–36.0)
MCV: 67.5 fl — ABNORMAL LOW (ref 78.0–100.0)
Monocytes Absolute: 0.4 10*3/uL (ref 0.1–1.0)
Neutrophils Relative %: 76.5 % (ref 43.0–77.0)
Platelets: 212 10*3/uL (ref 150.0–400.0)
RBC: 5.42 Mil/uL — ABNORMAL HIGH (ref 3.87–5.11)
RDW: 15.9 % — ABNORMAL HIGH (ref 11.5–14.6)

## 2013-03-29 LAB — COMPREHENSIVE METABOLIC PANEL
AST: 19 U/L (ref 0–37)
Albumin: 3.8 g/dL (ref 3.5–5.2)
BUN: 35 mg/dL — ABNORMAL HIGH (ref 6–23)
Calcium: 10 mg/dL (ref 8.4–10.5)
Chloride: 109 mEq/L (ref 96–112)
Potassium: 4.4 mEq/L (ref 3.5–5.1)
Sodium: 135 mEq/L (ref 135–145)
Total Protein: 9.2 g/dL — ABNORMAL HIGH (ref 6.0–8.3)

## 2013-03-29 LAB — URINALYSIS, ROUTINE W REFLEX MICROSCOPIC
Bilirubin Urine: NEGATIVE
Ketones, ur: NEGATIVE
Leukocytes, UA: NEGATIVE
RBC / HPF: NONE SEEN (ref 0–?)
Urine Glucose: NEGATIVE
Urobilinogen, UA: 0.2 (ref 0.0–1.0)

## 2013-03-29 LAB — FERRITIN: Ferritin: 46.6 ng/mL (ref 10.0–291.0)

## 2013-03-29 LAB — LIPID PANEL
HDL: 53.1 mg/dL (ref >39.00)
Triglycerides: 82 mg/dL (ref 0.0–149.0)
VLDL: 16.4 mg/dL (ref 0.0–40.0)

## 2013-03-29 LAB — SEDIMENTATION RATE: Sed Rate: 71 mm/hr — ABNORMAL HIGH (ref 0–22)

## 2013-03-29 LAB — VITAMIN B12: Vitamin B-12: 281 pg/mL (ref 211–911)

## 2013-03-29 LAB — FOLATE: Folate: 6.4 ng/mL (ref >5.9)

## 2013-03-29 NOTE — Assessment & Plan Note (Signed)
I am not sure that she would benefit from treatment, will refer to Neurology to consider diagnostics and treatment options

## 2013-03-29 NOTE — Patient Instructions (Signed)
Diarrhea Diarrhea is frequent loose and watery bowel movements. It can cause you to feel weak and dehydrated. Dehydration can cause you to become tired and thirsty, have a dry mouth, and have decreased urination that often is dark yellow. Diarrhea is a sign of another problem, most often an infection that will not last long. In most cases, diarrhea typically lasts 2 3 days. However, it can last longer if it is a sign of something more serious. It is important to treat your diarrhea as directed by your caregive to lessen or prevent future episodes of diarrhea. CAUSES  Some common causes include:  Gastrointestinal infections caused by viruses, bacteria, or parasites.  Food poisoning or food allergies.  Certain medicines, such as antibiotics, chemotherapy, and laxatives.  Artificial sweeteners and fructose.  Digestive disorders. HOME CARE INSTRUCTIONS  Ensure adequate fluid intake (hydration): have 1 cup (8 oz) of fluid for each diarrhea episode. Avoid fluids that contain simple sugars or sports drinks, fruit juices, whole milk products, and sodas. Your urine should be clear or pale yellow if you are drinking enough fluids. Hydrate with an oral rehydration solution that you can purchase at pharmacies, retail stores, and online. You can prepare an oral rehydration solution at home by mixing the following ingredients together:    tsp table salt.   tsp baking soda.   tsp salt substitute containing potassium chloride.  1  tablespoons sugar.  1 L (34 oz) of water.  Certain foods and beverages may increase the speed at which food moves through the gastrointestinal (GI) tract. These foods and beverages should be avoided and include:  Caffeinated and alcoholic beverages.  High-fiber foods, such as raw fruits and vegetables, nuts, seeds, and whole grain breads and cereals.  Foods and beverages sweetened with sugar alcohols, such as xylitol, sorbitol, and mannitol.  Some foods may be well  tolerated and may help thicken stool including:  Starchy foods, such as rice, toast, pasta, low-sugar cereal, oatmeal, grits, baked potatoes, crackers, and bagels.  Bananas.  Applesauce.  Add probiotic-rich foods to help increase healthy bacteria in the GI tract, such as yogurt and fermented milk products.  Wash your hands well after each diarrhea episode.  Only take over-the-counter or prescription medicines as directed by your caregiver.  Take a warm bath to relieve any burning or pain from frequent diarrhea episodes. SEEK IMMEDIATE MEDICAL CARE IF:   You are unable to keep fluids down.  You have persistent vomiting.  You have blood in your stool, or your stools are black and tarry.  You do not urinate in 6 8 hours, or there is only a small amount of very dark urine.  You have abdominal pain that increases or localizes.  You have weakness, dizziness, confusion, or lightheadedness.  You have a severe headache.  Your diarrhea gets worse or does not get better.  You have a fever or persistent symptoms for more than 2 3 days.  You have a fever and your symptoms suddenly get worse. MAKE SURE YOU:   Understand these instructions.  Will watch your condition.  Will get help right away if you are not doing well or get worse. Document Released: 08/19/2002 Document Revised: 08/15/2012 Document Reviewed: 05/06/2012 [employer] Patient Information 2014 [employer].  

## 2013-03-29 NOTE — Assessment & Plan Note (Signed)
I will recheck her CBC and her vitamin levels, will treat if needed

## 2013-03-29 NOTE — Assessment & Plan Note (Signed)
I will check her CBC, if WBC is up may start empiric treatment for C diff infection Have also ordered stool studies

## 2013-03-29 NOTE — Progress Notes (Signed)
Subjective:    Patient ID: Marissa [name], female    DOB: 04-10-1941, 72 y.o.   MRN: [id number]  Diarrhea  This is a new problem. The current episode started in the past 7 days. The problem occurs 2 to 4 times per day. The problem has been unchanged. The stool consistency is described as watery. The patient states that diarrhea does not awaken her from sleep. Pertinent negatives include no abdominal pain, arthralgias, bloating, chills, coughing, fever, headaches, increased  flatus, myalgias, sweats, URI, vomiting or weight loss. There are no known risk factors. She has tried anti-motility drug for the symptoms. The treatment provided moderate relief.      Review of Systems  Constitutional: Negative.  Negative for fever, chills, weight loss, diaphoresis, activity change, appetite change, fatigue and unexpected weight change.  HENT: Negative.   Eyes: Negative.   Respiratory: Negative.  Negative for cough, chest tightness, shortness of breath, wheezing and stridor.   Cardiovascular: Negative.  Negative for chest pain, palpitations and leg swelling.  Gastrointestinal: Positive for nausea and diarrhea. Negative for vomiting, abdominal pain, constipation, abdominal distention, anal bleeding, rectal pain, bloating and flatus.  Endocrine: Negative.   Genitourinary: Negative.   Musculoskeletal: Negative.  Negative for myalgias and arthralgias.  Skin: Negative.   Allergic/Immunologic: Negative.   Neurological: Negative.  Negative for dizziness, weakness and headaches.  Hematological: Negative.  Negative for adenopathy. Does not bruise/bleed easily.  Psychiatric/Behavioral: Positive for confusion and decreased concentration. Negative for suicidal ideas, hallucinations, behavioral problems, sleep disturbance, self-injury, dysphoric mood and agitation. The patient is not nervous/anxious and is not hyperactive.        Objective:   Physical Exam  Vitals reviewed. Constitutional: She is oriented to  person, place, and time. She appears well-developed and well-nourished.  Non-toxic appearance. She does not have a sickly appearance. She does not appear ill. No distress.  HENT:  Head: Normocephalic and atraumatic.  Mouth/Throat: Oropharynx is clear and moist. No oropharyngeal exudate.  Eyes: Conjunctivae are normal. Right eye exhibits no discharge. Left eye exhibits no discharge. No scleral icterus.  Neck: Normal range of motion. Neck supple. No JVD present. No tracheal deviation present. No thyromegaly present.  Cardiovascular: Normal rate, regular rhythm, normal heart sounds and intact distal pulses.  Exam reveals no gallop and no friction rub.   No murmur heard. Pulmonary/Chest: Effort normal and breath sounds normal. No stridor. No respiratory distress. She has no wheezes. She has no rales. She exhibits no tenderness.  Abdominal: Soft. Normal appearance and bowel sounds are normal. She exhibits no shifting dullness, no distension, no pulsatile liver, no fluid wave, no abdominal bruit, no ascites, no pulsatile midline mass and no mass. There is no hepatosplenomegaly, splenomegaly or hepatomegaly. There is no tenderness. There is no rebound, no guarding and no CVA tenderness.  Musculoskeletal: Normal range of motion. She exhibits no edema and no tenderness.  Lymphadenopathy:    She has no cervical adenopathy.  Neurological: She is oriented to person, place, and time.  Skin: Skin is warm and dry. No rash noted. She is not diaphoretic. No erythema. No pallor.  Psychiatric: She has a normal mood and affect. Judgment and thought content normal. Her speech is delayed. Her speech is not rapid and/or pressured, not tangential and not slurred. She is slowed and withdrawn. She is not agitated, not aggressive, not hyperactive, not actively hallucinating and not combative. Cognition and memory are impaired. She is communicative. She exhibits abnormal recent memory and abnormal remote memory. She is  inattentive.     Lab Results  Component Value Date   WBC 6.8 08/13/2012   HGB 7.6* 08/13/2012   HCT 24.6* 08/13/2012   PLT 288 08/13/2012   GLUCOSE 88 03/18/2013   CHOL 164 08/06/2012   TRIG 153* 08/06/2012   HDL 56.50 11/23/2011   LDLCALC 106* 11/23/2011   ALT 22 08/09/2012   AST 23 08/09/2012   NA 139 03/18/2013   K 4.1 03/18/2013   CL 108 03/18/2013   CREATININE 1.6* 03/18/2013   BUN 33* 03/18/2013   CO2 21 03/18/2013   TSH 1.53 11/23/2011   INR 1.13 08/01/2012   HGBA1C 5.8 11/23/2011   MICROALBUR 0.50 04/03/2009       Assessment & Plan:

## 2013-03-29 NOTE — Assessment & Plan Note (Signed)
Her BP is well controlled 

## 2013-03-29 NOTE — Assessment & Plan Note (Signed)
FLP today 

## 2013-03-29 NOTE — Assessment & Plan Note (Signed)
I think this is caused by metabolic disease Will recheck her BMP today to see if there has been a decline Will also look at her urine to see if there is protein or an active sediment

## 2013-04-01 ENCOUNTER — Other Ambulatory Visit: Payer: Medicare Other

## 2013-04-01 DIAGNOSIS — A09 Infectious gastroenteritis and colitis, unspecified: Secondary | ICD-10-CM

## 2013-04-02 LAB — GIARDIA/CRYPTOSPORIDIUM (EIA): Giardia Screen (EIA): NEGATIVE

## 2013-04-02 LAB — OVA AND PARASITE EXAMINATION: OP: NONE SEEN

## 2013-04-08 ENCOUNTER — Telehealth: Payer: Self-pay | Admitting: Cardiology

## 2013-04-08 NOTE — Telephone Encounter (Signed)
New prob  Marissa [name] is wanting to know if you received some paperwork on this patient on Friday.

## 2013-04-08 NOTE — Telephone Encounter (Signed)
Spoke with Toniann Fail. Paperwork was for CPAP equipment. Toniann Fail is aware Dr Shirlee Latch does not order and manage CPAP equipment.

## 2013-04-15 ENCOUNTER — Ambulatory Visit: Payer: Medicare Other | Admitting: Neurology

## 2013-04-30 ENCOUNTER — Other Ambulatory Visit: Payer: Medicare Other

## 2013-05-08 ENCOUNTER — Telehealth: Payer: Self-pay | Admitting: *Deleted

## 2013-05-08 DIAGNOSIS — G4733 Obstructive sleep apnea (adult) (pediatric): Secondary | ICD-10-CM

## 2013-05-08 NOTE — Telephone Encounter (Signed)
Pt called requesting order for CPap machine.  Last OV 7.18.14.

## 2013-05-08 NOTE — Telephone Encounter (Signed)
I don't order CPAP machines, I have sent a referral to sleep med

## 2013-05-08 NOTE — Telephone Encounter (Signed)
Spoke with pt advised of MDs message 

## 2013-06-07 ENCOUNTER — Encounter: Payer: Self-pay | Admitting: Cardiology

## 2013-06-07 ENCOUNTER — Ambulatory Visit (INDEPENDENT_AMBULATORY_CARE_PROVIDER_SITE_OTHER): Payer: Medicare Other | Admitting: Cardiology

## 2013-06-07 ENCOUNTER — Institutional Professional Consult (permissible substitution): Payer: Medicare Other | Admitting: Pulmonary Disease

## 2013-06-07 VITALS — BP 160/98 | HR 72 | Ht 64.0 in | Wt 232.0 lb

## 2013-06-07 DIAGNOSIS — I2510 Atherosclerotic heart disease of native coronary artery without angina pectoris: Secondary | ICD-10-CM

## 2013-06-07 DIAGNOSIS — R0609 Other forms of dyspnea: Secondary | ICD-10-CM

## 2013-06-07 DIAGNOSIS — E785 Hyperlipidemia, unspecified: Secondary | ICD-10-CM

## 2013-06-07 DIAGNOSIS — R0600 Dyspnea, unspecified: Secondary | ICD-10-CM

## 2013-06-07 DIAGNOSIS — I10 Essential (primary) hypertension: Secondary | ICD-10-CM

## 2013-06-07 MED ORDER — AMLODIPINE BESYLATE 10 MG PO TABS: 10.0000 mg | ORAL_TABLET | Freq: Every day | ORAL | Status: DC

## 2013-06-07 MED ORDER — ATORVASTATIN CALCIUM 20 MG PO TABS: 20.0000 mg | ORAL_TABLET | Freq: Every day | ORAL | Status: DC

## 2013-06-07 MED ORDER — OLMESARTAN MEDOXOMIL-HCTZ 20-12.5 MG PO TABS: 1.0000 | ORAL_TABLET | Freq: Every morning | ORAL | Status: DC

## 2013-06-07 NOTE — Patient Instructions (Addendum)
Your physician wants you to follow-up in: 6 MONTHS WITH DR [facility] You will receive a reminder letter in the mail two months in advance. If you don't receive a letter, please call our office to schedule the follow-up appointment.   RESTART AMLODIPINE 10 MG DAILY  RESTART LIPITOR 20 MG ONCE DAILY  RESTART BENICAR 20-12.5 MG ONCE DAILY  BUY A PILL BOX AND FILL WITH MEDICINE WEEKLY

## 2013-06-09 NOTE — Progress Notes (Signed)
Patient ID: Marissa [name], female   DOB: 04-05-1941, 72 y.o.   MRN:   MRN: [id number] PCP: Dr. Yetta Barre  72 yo with history of CAD and HTN presents for cardiology followup.  She has a history of intervention on an OM.  She had a Lexiscan Cardiolite in 6/14 that showed no ischemia or infarction.  Echo in 6/14 showed EF 55-60%.  Lately, she has felt "good."  No chest pain.  Dyspnea only when she walks a long distance or goes up a flight of steps.  BP is running high today.  She is out of amlodipine and atorvastatin.  She did not take her Benicar-HCT today.  She is going to see Dr. Shelle Iron for CPAP adjustment.   Labs (1/13): LDL 133, HDL 48, HCT 36.3, K 4.1, creatinine 1.23 => 1.4, BNP 58 Labs (12/13): K 4.4, creatinine 1.45 Labs (7/14): K 4.4, creatinine 1.5, LDL 70, HDL 53, BNP 63  PMH: 1. Anemia 2. Diabetes: diet-controlled. 3. HTN 4. GERD 5. History of diverticulitis 6. Hyperlipidemia 7. Schatzski's ring 8. Fibroids: s/p TAH-BSO in 11/13 for vaginal bleeding.  9. CAD: 8/07 had BMS to OM.  In-stent restenosis with later Promus DES to same site.  Lexiscan myoview in 1/13 showed EF 67%, no ischemia or infarction.  Echo (2/13) showed EF 55-60%, moderate LVH, mild MR.  Lexiscan Cardiolite in 6/14 with EF 53%, no ischemia/infarction.  Echo (6/14) with EF 55-60%, mild LVH, mild MR.  10. Peripheral neuropathy 11. HTN 12. CKD  SH: Single with 1 daughter.  She does not smoke.   FH: No premature CAD  ROS: All systems reviewed and negative except as per HPI.   Current Outpatient Prescriptions  Medication Sig Dispense Refill  . amLODipine (NORVASC) 10 MG tablet Take 1 tablet (10 mg total) by mouth daily.  30 tablet  6  . aspirin EC 81 MG tablet 1 tablet every other day      . atorvastatin (LIPITOR) 20 MG tablet Take 1 tablet (20 mg total) by mouth daily.  30 tablet  6  . nitroGLYCERIN (NITROSTAT) 0.4 MG SL tablet Place 1 tablet (0.4 mg total) under the tongue every 5 (five) minutes as needed for chest pain.   100 tablet  3  . olmesartan-hydrochlorothiazide (BENICAR HCT) 20-12.5 MG per tablet Take 1 tablet by mouth every morning.  30 tablet  6  . oxyCODONE-acetaminophen (PERCOCET/ROXICET) 5-325 MG per tablet Take 1-2 tablets by mouth every 4 (four) hours as needed for pain (moderate to severe pain (when tolerating fluids)).  40 tablet  0   No current facility-administered medications for this visit.    BP 160/98  Pulse 72  Ht 5\' 4"  (1.626 m)  Wt 105.235 kg (232 lb)  BMI 39.8 kg/m2 General: NAD, obese. Neck: No JVD, no thyromegaly or thyroid nodule.  Lungs: Clear to auscultation bilaterally with normal respiratory effort. CV: Nondisplaced PMI.  Heart regular S1/S2, no S3/S4, no murmur.  No peripheral edema.  No carotid bruit.  2+ DP pulses bilaterally.  Abdomen: Soft, nontender, no hepatosplenomegaly, no distention.  Neurologic: Alert and oriented x 3.  Psych: Normal affect. Extremities: No clubbing or cyanosis.   Assessment/Plan: 1. HTN: Patient did not take any meds today.  She is actually out of amlodipine.  I will refill amlodipine (and Benicar HCT just in case).  I asked her daughter to start putting together a weekly pillbox for her medications as I get the feeling she is rather sporadic with them.  2. Hyperlipidemia: History of CAD.  I will refill her atorvastatin and she will start back on it.  3. CAD: Patient has a history of CAD with OM PCI.  Preserved EF on recent echo and nonischemic Cardiolite. No chest pain.  Continue atorvastatin.  She is taking ASA 81 mg every other day.  She does not want to increase to daily given past hemorrhage into her eye.   Marca Ancona 06/09/2013

## 2013-06-17 ENCOUNTER — Encounter: Payer: Self-pay | Admitting: Pulmonary Disease

## 2013-06-17 ENCOUNTER — Ambulatory Visit (INDEPENDENT_AMBULATORY_CARE_PROVIDER_SITE_OTHER): Payer: Medicare Other | Admitting: Pulmonary Disease

## 2013-06-17 VITALS — BP 132/76 | HR 77 | Temp 97.5°F | Ht 64.0 in | Wt 233.2 lb

## 2013-06-17 DIAGNOSIS — Z23 Encounter for immunization: Secondary | ICD-10-CM

## 2013-06-17 DIAGNOSIS — G4733 Obstructive sleep apnea (adult) (pediatric): Secondary | ICD-10-CM

## 2013-06-17 NOTE — Assessment & Plan Note (Signed)
The patient has a history of mild obstructive sleep apnea, and has had difficulty wearing because of excessive moisture and her machine not working properly.  It is 72 years old, and needs to be replaced.  I will order a new machine for her, and also take this opportunity to optimize her pressure.  I have also encouraged her to work aggressively on weight loss.

## 2013-06-17 NOTE — Patient Instructions (Addendum)
Will get you a new cpap machine, and also optimize your pressure again on the automatic setting.  Will let you know once I receive your download. Work on [employer] loss Have your homecare company show you how to use the heater on your humidifier. Will give you the flu shot today. followup with me in 6mos, but call if having issues with your machine.

## 2013-06-17 NOTE — Progress Notes (Signed)
Subjective:    Patient ID: Marissa [name], female    DOB: 11-12-1940, 72 y.o.   MRN: [id number]  HPI The patient is a 72 year old female who been asked to see for management of obstructive sleep apnea.  She was diagnosed in 2006 with an AHI of 10 events per hour.  She underwent CPAP titration, and found to have an optimal pressure of 15 cm of water.  She started on CPAP and did very well with the device, but this year she's been having issues.  It is creating too much moisture, but the patient is not aware of help to her chest her humidifier.  It is also acting funny with the buttons, and making noises, and the patient thinks she needs a new machine.  She is currently having fragmented sleep, and has not rested in the mornings upon arising.  She falls asleep easily during the day with inactivity, and can also get very sleepy in the evening watching television or movies.  She currently does not drive.  The patient states that her weight is up since her original study, and her epworth score today is abnormal at 18.   Sleep Questionnaire What time do you typically go to bed?( Between what hours) 11pm 11pm at 1142 on 06/17/13 by Maisie Fus, CMA How long does it take you to fall asleep? 1hr-3hr 1hr-3hr at 1142 on 06/17/13 by Maisie Fus, CMA How many times during the night do you wake up? No Value unsure-- several at 1142 on 06/17/13 by Maisie Fus, CMA What time do you get out of bed to start your day? 0900 0900 at 1142 on 06/17/13 by Maisie Fus, CMA Do you drive or operate heavy machinery in your occupation? No No at 1142 on 06/17/13 by Maisie Fus, CMA How much has your weight changed (up or down) over the past two years? (In pounds) 40 lb (18.144 kg)40 lb (18.144 kg) increase at 1142 on 06/17/13 by Maisie Fus, CMA Have you ever had a sleep study before? Yes Yes at 1142 on 06/17/13 by Maisie Fus, CMA If yes, [facility]2 on 06/17/13 by  Maisie Fus, CMA If yes, date of study? 2006 2006 at 1142 on 06/17/13 by Maisie Fus, CMA Do you currently use CPAP? NoNo owner of CPAP-- needs new tubing at 1142 on 06/17/13 by Maisie Fus, CMA Do you wear oxygen at any time? No No at 1142 on 06/17/13 by Maisie Fus, CMA   Review of Systems  Constitutional: Negative for fever and unexpected weight change.  HENT: Positive for congestion, rhinorrhea, postnasal drip and sinus pressure. Negative for ear pain, nosebleeds, sore throat, sneezing, trouble swallowing and dental problem.   Eyes: Negative for redness and itching.  Respiratory: Positive for cough, shortness of breath and wheezing. Negative for chest tightness.   Cardiovascular: Negative for palpitations and leg swelling.  Gastrointestinal: Negative for nausea and vomiting.  Genitourinary: Negative for dysuria.  Musculoskeletal: Negative for joint swelling.  Skin: Negative for rash.  Neurological: Positive for headaches.  Hematological: Does not bruise/bleed easily.  Psychiatric/Behavioral: Negative for dysphoric mood. The patient is nervous/anxious.        Objective:   Physical Exam Constitutional:  Overweight female, no acute distress  HENT:  Nares patent without discharge, deviated septum to left with narrowing.   Oropharynx without exudate, palate and uvula are difficult to visualize due to large tongue and small space posteriorly.  Eyes:  Perrla, eomi, no scleral icterus  Neck:  No JVD, no TMG  Cardiovascular:  Normal rate, regular rhythm, no rubs or gallops.  No murmurs        Intact distal pulses but decreased.   Pulmonary :  Normal breath sounds, no stridor or respiratory distress   No rales, rhonchi, or wheezing  Abdominal:  Soft, nondistended, bowel sounds present.  No tenderness noted.   Musculoskeletal:  mild lower extremity edema noted.  Lymph Nodes:  No cervical lymphadenopathy noted  Skin:  No cyanosis noted  Neurologic:  Alert,  appropriate, moves all 4 extremities without obvious deficit.         Assessment & Plan:

## 2013-06-19 ENCOUNTER — Telehealth: Payer: Self-pay | Admitting: Pulmonary Disease

## 2013-06-19 NOTE — Telephone Encounter (Signed)
Noted and will forward to [facility] as an [place]

## 2013-06-21 ENCOUNTER — Telehealth: Payer: Self-pay | Admitting: *Deleted

## 2013-06-21 ENCOUNTER — Ambulatory Visit (INDEPENDENT_AMBULATORY_CARE_PROVIDER_SITE_OTHER)
Admission: RE | Admit: 2013-06-21 | Discharge: 2013-06-21 | Disposition: A | Discharge status: Home / Self Care | Payer: Medicare Other | Source: Ambulatory Visit | Attending: Internal Medicine | Admitting: Internal Medicine

## 2013-06-21 ENCOUNTER — Encounter: Payer: Self-pay | Admitting: Internal Medicine

## 2013-06-21 ENCOUNTER — Ambulatory Visit (INDEPENDENT_AMBULATORY_CARE_PROVIDER_SITE_OTHER): Payer: Medicare Other | Admitting: Internal Medicine

## 2013-06-21 ENCOUNTER — Telehealth: Payer: Self-pay

## 2013-06-21 VITALS — BP 124/78 | HR 78 | Temp 98.7°F | Ht 64.0 in | Wt 232.0 lb

## 2013-06-21 DIAGNOSIS — S6990XA Unspecified injury of unspecified wrist, hand and finger(s), initial encounter: Secondary | ICD-10-CM

## 2013-06-21 DIAGNOSIS — M79609 Pain in unspecified limb: Secondary | ICD-10-CM

## 2013-06-21 DIAGNOSIS — M79641 Pain in right hand: Secondary | ICD-10-CM

## 2013-06-21 DIAGNOSIS — S6991XA Unspecified injury of right wrist, hand and finger(s), initial encounter: Secondary | ICD-10-CM

## 2013-06-21 NOTE — Telephone Encounter (Signed)
Called mobile number that was on file. Individual that was reached said that she was not available.She told me to try her sister, but she did not know her number. Will try home number left on file again.

## 2013-06-21 NOTE — Telephone Encounter (Signed)
Called pt to discuss results from X-Ray. No answer will try again at a later time.

## 2013-06-21 NOTE — Patient Instructions (Signed)
Finger Fracture (Phalangeal) A broken bone of the finger (phalangealfracture) is a common injury for athletes. A single injury (trauma) is likely to fracture multiple bones on the same or different fingers. SYMPTOMS   Severe pain, at the time of injury.  Pain, tenderness, swelling, and later bruising of the finger and then the hand.  Visible deformity, if the fracture is complete and the bone fragments separate enough to distort the normal shape.  Numbness or coldness from swelling in the finger, causing pressure on blood vessels or nerves (uncommon). CAUSES  Direct or indirect injury (trauma) to the finger.  RISK INCREASES WITH:   Contact sports (football, rugby) or other sports where injury to the hand is likely (soccer, baseball, basketball).  Sports that require hitting (boxing, martial arts).  History of bone or joint disease, such as osteoporosis, or previous bone restraint.  Poor hand strength and flexibility. PREVENTION   For contact sports, wear appropriate and properly fitted protective equipment for the hand.  Learn and use proper technique when hitting, punching, or landing after a fall.  If you had a previous finger injury or hand restraint, use tape or padding to protect the finger when playing sports where finger injury is likely. PROGNOSIS  With proper treatment and normal alignment of the bones, healing can usually be expected in 4 to 6 weeks. Sometimes, surgery is needed.  RELATED COMPLICATIONS   Fracture does not heal (nonunion).  Bone heals in wrong position (malunion).  Chronic pain, stiffness, or swelling of the hand.  Excessive bleeding, causing pressure on nerves and blood vessels.  Unstable or arthritic joint, following repeated injury or delayed treatment.  Hindrance of normal growth in children.  Infection in skin broken over the fracture (open fracture) or at the incision or pin sites from surgery.  Shortening of injured bones.  Bony bumps  or loss of shape of the fingers.  Arthritic or stiff finger joint, if the fracture reaches the joint. TREATMENT  If the bones are properly aligned, treatment involves ice and medicine to reduce pain and inflammation. Then, the finger is restrained for 4 or more weeks, to allow for healing. If the fracture is out of alignment (displaced), involves more than one bone, or involves a joint, surgery is usually advised. Surgery often involves placing removable pins, screws, and sometimes plates, to hold the bones in proper alignment. After restraint (with or without surgery), stretching and strengthening exercises are needed. Exercises may be completed at home or with a therapist. For certain sports, wearing a splint or having the finger taped during future activity is advised.  MEDICATION   If pain medicine is needed, nonsteroidal anti-inflammatory medicines (aspirin and ibuprofen), or other minor pain relievers (acetaminophen), are often advised.  Do not take pain medicine for 7 days before surgery.  Prescription pain relievers are usually prescribed only after surgery. Use only as directed and only as much as you need. COLD THERAPY   Cold treatment (icing) relieves pain and reduces inflammation. Cold treatment should be applied for 10 to 15 minutes every 2 to 3 hours, and immediately after activity that aggravates your symptoms. Use ice packs or an ice massage. SEEK MEDICAL CARE IF:   Pain, tenderness, or swelling gets worse, despite treatment.  You experience pain, numbness, or coldness in the hand.  Blue, gray, or dark color appears in the fingernails.  Any of the following occur after surgery: fever, increased pain, swelling, redness, drainage of fluids, or bleeding in the affected area.  New,   unexplained symptoms develop. (Drugs used in treatment may produce side effects.) °Document Released: 08/29/2005 Document Revised: 11/21/2011 Document Reviewed: 12/11/2008 °[employer]® Patient  Information ©2014 [employer]. ° °

## 2013-06-21 NOTE — Progress Notes (Signed)
Subjective:    Patient ID: Marissa [name], female    DOB: 1940-12-28, 72 y.o.   MRN: [id number]  HPI  Pt presents to the clinic today with c/o right hand pain after it was slammed in a car door. This occurred yesterday. She has swelling, bruising, pain that shoots up her arm. She is unable to close her fist secondary to the pain. She has iced it and taken tylenol without much relief.   Review of Systems      Past Medical History  Diagnosis Date  . Hypertension   . Renal insufficiency     baseline Cr ~ 1.3  . Hyperlipidemia   . History of DVT (deep vein thrombosis)   . History of colonic polyps   . Gout   . Polyarthritis   . Microcytic anemia   . OSA (obstructive sleep apnea)   . GERD (gastroesophageal reflux disease)   . Esophagitis   . Hidradenitis suppurativa     s/p axillary sweat gland removal  . H/O blood transfusion reaction     at [facility] hospital  . Headache(784.0)   . Diverticulitis   . Difficulty sleeping   . PMB (postmenopausal bleeding)     X 2 YRS  . Bowel trouble     OCCASIONAL BOWEL INCONTINENCE  . Diabetes mellitus without complication     diet controlled  . Fibroids     s/p TAH/BSO 07/2012  . CAD (coronary artery disease)     a. 8/07 had BMS to OM.  b. In-stent restenosis with later Promus DES to same site.  c. Lexiscan myoview in 1/13 showed EF 67%, no ischemia or infarction.  d. Echo (2/13) showed EF 55-60%, moderate LVH, mild MR.  e. Lex MV 6/14: no isch, EF 53%;  f. Echo 6/14: mild LVH, EF 55-60%, Gr 1 DD, MAC, mild MR, mild LAE  . Peripheral neuropathy     Current Outpatient Prescriptions  Medication Sig Dispense Refill  . amLODipine (NORVASC) 10 MG tablet Take 1 tablet (10 mg total) by mouth daily.  30 tablet  6  . aspirin EC 81 MG tablet 1 tablet every other day      . atorvastatin (LIPITOR) 20 MG tablet Take 1 tablet (20 mg total) by mouth daily.  30 tablet  6  . nitroGLYCERIN (NITROSTAT) 0.4 MG SL tablet Place 1 tablet (0.4 mg total) under the  tongue every 5 (five) minutes as needed for chest pain.  100 tablet  3  . olmesartan-hydrochlorothiazide (BENICAR HCT) 20-12.5 MG per tablet Take 1 tablet by mouth every morning.  30 tablet  6  . oxyCODONE-acetaminophen (PERCOCET/ROXICET) 5-325 MG per tablet Take 1-2 tablets by mouth every 4 (four) hours as needed for pain (moderate to severe pain (when tolerating fluids)).  40 tablet  0   No current facility-administered medications for this visit.    No Known Allergies  Family History  Problem Relation Age of Onset  . Diabetes Mother   . Coronary artery disease Mother   . Diabetes Sister   . Coronary artery disease Sister     History   Social History  . Marital Status: Widowed    Spouse Name: N/A    Number of Children: 5  . Years of Education: 12   Occupational History  . Retired    Social History Main Topics  . Smoking status: Never Smoker   . Smokeless tobacco: Never Used  . Alcohol Use: No  . Drug Use: No  . Sexual Activity:  No   Other Topics Concern  . Not on file   Social History Narrative   Widowed. Lives with daughter.   5 living children, 1 deceased.   Retired, used to work in [employer].   12th grade high school education.   Has Medicaid.           Constitutional: Denies fever, malaise, fatigue, headache or abrupt weight changes.  Musculoskeletal: Right hand pain and swelling.  Denies decrease in range of motion, difficulty with gait, muscle pain or joint pain and swelling.  Skin: Pt reports bruising of right hand. Denies redness, rashes, lesions or ulcercations.  Neurological: Denies dizziness, difficulty with memory, difficulty with speech or problems with balance and coordination.   No other specific complaints in a complete review of systems (except as listed in HPI above).  Objective:   Physical Exam  BP 124/78  Pulse 78  Temp(Src) 98.7 F (37.1 C) (Oral)  Ht 5\' 4"  (1.626 m)  Wt 232 lb (105.235 kg)  BMI 39.8 kg/m2  SpO2  99% Wt Readings from Last 3 Encounters:  06/21/13 232 lb (105.235 kg)  06/17/13 233 lb 3.2 oz (105.779 kg)  06/07/13 232 lb (105.235 kg)    General: Appears her stated age, obese but well developed, well nourished in NAD. Skin: Warm, dry and intact. No rashes, lesions or ulcerations noted.  Cardiovascular: Normal rate and rhythm. S1,S2 noted.  No murmur, rubs or gallops noted. No JVD or BLE edema. No carotid bruits noted. Pulmonary/Chest: Normal effort and positive vesicular breath sounds. No respiratory distress. No wheezes, rales or ronchi noted.  Musculoskeletal: Decreased flexion/extension of the right fingers. 2+ swelling.  No difficulty with gait.    BMET    Component Value Date/Time   NA 135 03/29/2013 1151   K 4.4 03/29/2013 1151   CL 109 03/29/2013 1151   CO2 19 03/29/2013 1151   GLUCOSE 97 03/29/2013 1151   BUN 35* 03/29/2013 1151   CREATININE 1.5* 03/29/2013 1151   CREATININE 1.23* 08/24/2011 1133   CALCIUM 10.0 03/29/2013 1151   GFRNONAA 35* 08/12/2012 0428   GFRAA 41* 08/12/2012 0428    Lipid Panel     Component Value Date/Time   CHOL 139 03/29/2013 1151   TRIG 82.0 03/29/2013 1151   HDL 53.10 03/29/2013 1151   CHOLHDL 3 03/29/2013 1151   VLDL 16.4 03/29/2013 1151   LDLCALC 70 03/29/2013 1151    CBC    Component Value Date/Time   WBC 6.9 03/29/2013 1151   RBC 5.42* 03/29/2013 1151   RBC 4.78 01/19/2010 2225   HGB 11.6* 03/29/2013 1151   HCT 36.6 03/29/2013 1151   PLT 212.0 03/29/2013 1151   MCV 67.5 Repeated and verified X2.* 03/29/2013 1151   MCH 20.8* 08/13/2012 0544   MCHC 31.7 03/29/2013 1151   RDW 15.9* 03/29/2013 1151   LYMPHSABS 1.1 03/29/2013 1151   MONOABS 0.4 03/29/2013 1151   EOSABS 0.1 03/29/2013 1151   BASOSABS 0.0 03/29/2013 1151    Hgb A1C Lab Results  Component Value Date   HGBA1C 5.8 11/23/2011         Assessment & Plan:   Right hand pain, swelling secondary to trauma:  Will check xray of right hand to r/o fracture If no fracture, information  given for RICE If fractured, will refer to Dr. Smith/orthopedics  Will follow up after xray is back

## 2013-06-21 NOTE — Telephone Encounter (Signed)
Spoke with pt advised of Regina's message. 

## 2013-12-16 ENCOUNTER — Ambulatory Visit: Payer: Medicare Other | Admitting: Pulmonary Disease

## 2014-05-29 ENCOUNTER — Ambulatory Visit (INDEPENDENT_AMBULATORY_CARE_PROVIDER_SITE_OTHER): Payer: Medicare Other | Admitting: Internal Medicine

## 2014-05-29 ENCOUNTER — Other Ambulatory Visit (INDEPENDENT_AMBULATORY_CARE_PROVIDER_SITE_OTHER): Payer: Medicare Other

## 2014-05-29 ENCOUNTER — Encounter: Payer: Self-pay | Admitting: Internal Medicine

## 2014-05-29 VITALS — BP 140/98 | HR 84 | Temp 97.8°F | Resp 16 | Ht 64.0 in | Wt 235.0 lb

## 2014-05-29 DIAGNOSIS — K2100 Gastro-esophageal reflux disease with esophagitis, without bleeding: Secondary | ICD-10-CM

## 2014-05-29 DIAGNOSIS — Z86718 Personal history of other venous thrombosis and embolism: Secondary | ICD-10-CM

## 2014-05-29 DIAGNOSIS — R079 Chest pain, unspecified: Secondary | ICD-10-CM

## 2014-05-29 DIAGNOSIS — N182 Chronic kidney disease, stage 2 (mild): Secondary | ICD-10-CM

## 2014-05-29 DIAGNOSIS — I2510 Atherosclerotic heart disease of native coronary artery without angina pectoris: Secondary | ICD-10-CM

## 2014-05-29 DIAGNOSIS — R0600 Dyspnea, unspecified: Secondary | ICD-10-CM

## 2014-05-29 DIAGNOSIS — D529 Folate deficiency anemia, unspecified: Secondary | ICD-10-CM

## 2014-05-29 DIAGNOSIS — F0390 Unspecified dementia without behavioral disturbance: Secondary | ICD-10-CM

## 2014-05-29 DIAGNOSIS — Z1231 Encounter for screening mammogram for malignant neoplasm of breast: Secondary | ICD-10-CM

## 2014-05-29 DIAGNOSIS — E785 Hyperlipidemia, unspecified: Secondary | ICD-10-CM

## 2014-05-29 DIAGNOSIS — Z23 Encounter for immunization: Secondary | ICD-10-CM

## 2014-05-29 DIAGNOSIS — M109 Gout, unspecified: Secondary | ICD-10-CM

## 2014-05-29 DIAGNOSIS — F068 Other specified mental disorders due to known physiological condition: Secondary | ICD-10-CM

## 2014-05-29 DIAGNOSIS — Z0000 Encounter for general adult medical examination without abnormal findings: Secondary | ICD-10-CM

## 2014-05-29 DIAGNOSIS — I10 Essential (primary) hypertension: Secondary | ICD-10-CM

## 2014-05-29 LAB — TSH: TSH: 2.5 u[IU]/mL (ref 0.35–4.50)

## 2014-05-29 LAB — LIPID PANEL
CHOLESTEROL: 209 mg/dL — AB (ref 0–200)
HDL: 51.3 mg/dL (ref >39.00)
LDL Cholesterol: 135 mg/dL — ABNORMAL HIGH (ref 0–99)
NonHDL: 157.7
Total CHOL/HDL Ratio: 4
Triglycerides: 114 mg/dL (ref 0.0–149.0)
VLDL: 22.8 mg/dL (ref 0.0–40.0)

## 2014-05-29 LAB — COMPREHENSIVE METABOLIC PANEL
ALBUMIN: 3.9 g/dL (ref 3.5–5.2)
ALT: 16 U/L (ref 0–35)
AST: 23 U/L (ref 0–37)
Alkaline Phosphatase: 94 U/L (ref 39–117)
BUN: 18 mg/dL (ref 6–23)
CALCIUM: 9.6 mg/dL (ref 8.4–10.5)
CHLORIDE: 106 meq/L (ref 96–112)
CO2: 23 meq/L (ref 19–32)
Creatinine, Ser: 1.5 mg/dL — ABNORMAL HIGH (ref 0.4–1.2)
GFR: 43.05 mL/min — AB (ref >60.00)
Glucose, Bld: 117 mg/dL — ABNORMAL HIGH (ref 70–99)
POTASSIUM: 3.7 meq/L (ref 3.5–5.1)
Sodium: 138 mEq/L (ref 135–145)
Total Bilirubin: 0.6 mg/dL (ref 0.2–1.2)
Total Protein: 9.2 g/dL — ABNORMAL HIGH (ref 6.0–8.3)

## 2014-05-29 LAB — CBC WITH DIFFERENTIAL/PLATELET
BASOS ABS: 0 10*3/uL (ref 0.0–0.1)
Basophils Relative: 0.5 % (ref 0.0–3.0)
EOS ABS: 0.1 10*3/uL (ref 0.0–0.7)
Eosinophils Relative: 2.4 % (ref 0.0–5.0)
HEMATOCRIT: 37.3 % (ref 36.0–46.0)
Hemoglobin: 11.8 g/dL — ABNORMAL LOW (ref 12.0–15.0)
LYMPHS ABS: 1.1 10*3/uL (ref 0.7–4.0)
Lymphocytes Relative: 19.7 % (ref 12.0–46.0)
MCHC: 31.6 g/dL (ref 30.0–36.0)
MONO ABS: 0.3 10*3/uL (ref 0.1–1.0)
Monocytes Relative: 6.3 % (ref 3.0–12.0)
NEUTROS PCT: 71.1 % (ref 43.0–77.0)
Neutro Abs: 4 10*3/uL (ref 1.4–7.7)
PLATELETS: 230 10*3/uL (ref 150.0–400.0)
RBC: 5.41 Mil/uL — ABNORMAL HIGH (ref 3.87–5.11)
RDW: 14.4 % (ref 11.5–15.5)
WBC: 5.6 10*3/uL (ref 4.0–10.5)

## 2014-05-29 LAB — CARDIAC PANEL
CK MB: 2.4 ng/mL (ref 0.3–4.0)
Relative Index: 1.5 calc (ref 0.0–2.5)
Total CK: 156 U/L (ref 7–177)

## 2014-05-29 LAB — VITAMIN B12: Vitamin B-12: 317 pg/mL (ref 211–911)

## 2014-05-29 LAB — FOLATE: Folate: 11.4 ng/mL (ref >5.9)

## 2014-05-29 LAB — TROPONIN I: TROPONIN I: 0.01 ng/mL (ref <0.06)

## 2014-05-29 MED ORDER — OLMESARTAN MEDOXOMIL-HCTZ 20-12.5 MG PO TABS: 1.0000 | ORAL_TABLET | Freq: Every morning | ORAL | Status: DC

## 2014-05-29 MED ORDER — AMLODIPINE BESYLATE 10 MG PO TABS: 10.0000 mg | ORAL_TABLET | Freq: Every day | ORAL | Status: DC

## 2014-05-29 MED ORDER — ATORVASTATIN CALCIUM 20 MG PO TABS: 20.0000 mg | ORAL_TABLET | Freq: Every day | ORAL | Status: DC

## 2014-05-29 NOTE — Patient Instructions (Signed)
Preventive Care for Adults A healthy lifestyle and preventive care can promote health and wellness. Preventive health guidelines for women include the following key practices.  A routine yearly physical is a good way to check with your health care provider about your health and preventive screening. It is a chance to share any concerns and updates on your health and to receive a thorough exam.  Visit your dentist for a routine exam and preventive care every 6 months. Brush your teeth twice a day and floss once a day. Good oral hygiene prevents tooth decay and gum disease.  The frequency of eye exams is based on your age, health, family medical history, use of contact lenses, and other factors. Follow your health care provider's recommendations for frequency of eye exams.  Eat a healthy diet. Foods like vegetables, fruits, whole grains, low-fat dairy products, and lean protein foods contain the nutrients you need without too many calories. Decrease your intake of foods high in solid fats, added sugars, and salt. Eat the right amount of calories for you.Get information about a proper diet from your health care provider, if necessary.  Regular physical exercise is one of the most important things you can do for your health. Most adults should get at least 150 minutes of moderate-intensity exercise (any activity that increases your heart rate and causes you to sweat) each week. In addition, most adults need muscle-strengthening exercises on 2 or more days a week.  Maintain a healthy weight. The body mass index (BMI) is a screening tool to identify possible weight problems. It provides an estimate of body fat based on height and weight. Your health care provider can find your BMI and can help you achieve or maintain a healthy weight.For adults 20 years and older:  A BMI below 18.5 is considered underweight.  A BMI of 18.5 to 24.9 is normal.  A BMI of 25 to 29.9 is considered overweight.  A BMI of  30 and above is considered obese.  Maintain normal blood lipids and cholesterol levels by exercising and minimizing your intake of saturated fat. Eat a balanced diet with plenty of fruit and vegetables. Blood tests for lipids and cholesterol should begin at age 76 and be repeated every 5 years. If your lipid or cholesterol levels are high, you are over 50, or you are at high risk for heart disease, you may need your cholesterol levels checked more frequently.Ongoing high lipid and cholesterol levels should be treated with medicines if diet and exercise are not working.  If you smoke, find out from your health care provider how to quit. If you do not use tobacco, do not start.  Lung cancer screening is recommended for adults aged 22-80 years who are at high risk for developing lung cancer because of a history of smoking. A yearly low-dose CT scan of the lungs is recommended for people who have at least a 30-pack-year history of smoking and are a current smoker or have quit within the past 15 years. A pack year of smoking is smoking an average of 1 pack of cigarettes a day for 1 year (for example: 1 pack a day for 30 years or 2 packs a day for 15 years). Yearly screening should continue until the smoker has stopped smoking for at least 15 years. Yearly screening should be stopped for people who develop a health problem that would prevent them from having lung cancer treatment.  If you are pregnant, do not drink alcohol. If you are breastfeeding,  be very cautious about drinking alcohol. If you are not pregnant and choose to drink alcohol, do not have more than 1 drink per day. One drink is considered to be 12 ounces (355 mL) of beer, 5 ounces (148 mL) of wine, or 1.5 ounces (44 mL) of liquor.  Avoid use of street drugs. Do not share needles with anyone. Ask for help if you need support or instructions about stopping the use of drugs.  High blood pressure causes heart disease and increases the risk of  stroke. Your blood pressure should be checked at least every 1 to 2 years. Ongoing high blood pressure should be treated with medicines if weight loss and exercise do not work.  If you are 3-86 years old, ask your health care provider if you should take aspirin to prevent strokes.  Diabetes screening involves taking a blood sample to check your fasting blood sugar level. This should be done once every 3 years, after age 67, if you are within normal weight and without risk factors for diabetes. Testing should be considered at a younger age or be carried out more frequently if you are overweight and have at least 1 risk factor for diabetes.  Breast cancer screening is essential preventive care for women. You should practice "breast self-awareness." This means understanding the normal appearance and feel of your breasts and may include breast self-examination. Any changes detected, no matter how small, should be reported to a health care provider. Women in their 8s and 30s should have a clinical breast exam (CBE) by a health care provider as part of a regular health exam every 1 to 3 years. After age 70, women should have a CBE every year. Starting at age 25, women should consider having a mammogram (breast X-ray test) every year. Women who have a family history of breast cancer should talk to their health care provider about genetic screening. Women at a high risk of breast cancer should talk to their health care providers about having an MRI and a mammogram every year.  Breast cancer gene (BRCA)-related cancer risk assessment is recommended for women who have family members with BRCA-related cancers. BRCA-related cancers include breast, ovarian, tubal, and peritoneal cancers. Having family members with these cancers may be associated with an increased risk for harmful changes (mutations) in the breast cancer genes BRCA1 and BRCA2. Results of the assessment will determine the need for genetic counseling and  BRCA1 and BRCA2 testing.  Routine pelvic exams to screen for cancer are no longer recommended for nonpregnant women who are considered low risk for cancer of the pelvic organs (ovaries, uterus, and vagina) and who do not have symptoms. Ask your health care provider if a screening pelvic exam is right for you.  If you have had past treatment for cervical cancer or a condition that could lead to cancer, you need Pap tests and screening for cancer for at least 20 years after your treatment. If Pap tests have been discontinued, your risk factors (such as having a new sexual partner) need to be reassessed to determine if screening should be resumed. Some women have medical problems that increase the chance of getting cervical cancer. In these cases, your health care provider may recommend more frequent screening and Pap tests.  The HPV test is an additional test that may be used for cervical cancer screening. The HPV test looks for the virus that can cause the cell changes on the cervix. The cells collected during the Pap test can be  tested for HPV. The HPV test could be used to screen women aged 30 years and older, and should be used in women of any age who have unclear Pap test results. After the age of 30, women should have HPV testing at the same frequency as a Pap test.  Colorectal cancer can be detected and often prevented. Most routine colorectal cancer screening begins at the age of 50 years and continues through age 75 years. However, your health care provider may recommend screening at an earlier age if you have risk factors for colon cancer. On a yearly basis, your health care provider may provide home test kits to check for hidden blood in the stool. Use of a small camera at the end of a tube, to directly examine the colon (sigmoidoscopy or colonoscopy), can detect the earliest forms of colorectal cancer. Talk to your health care provider about this at age 50, when routine screening begins. Direct  exam of the colon should be repeated every 5-10 years through age 75 years, unless early forms of pre-cancerous polyps or small growths are found.  People who are at an increased risk for hepatitis B should be screened for this virus. You are considered at high risk for hepatitis B if:  You were born in a country where hepatitis B occurs often. Talk with your health care provider about which countries are considered high risk.  Your parents were born in a high-risk country and you have not received a shot to protect against hepatitis B (hepatitis B vaccine).  You have HIV or AIDS.  You use needles to inject street drugs.  You live with, or have sex with, someone who has hepatitis B.  You get hemodialysis treatment.  You take certain medicines for conditions like cancer, organ transplantation, and autoimmune conditions.  Hepatitis C blood testing is recommended for all people born from 1945 through 1965 and any individual with known risks for hepatitis C.  Practice safe sex. Use condoms and avoid high-risk sexual practices to reduce the spread of sexually transmitted infections (STIs). STIs include gonorrhea, chlamydia, syphilis, trichomonas, herpes, HPV, and human immunodeficiency virus (HIV). Herpes, HIV, and HPV are viral illnesses that have no cure. They can result in disability, cancer, and death.  You should be screened for sexually transmitted illnesses (STIs) including gonorrhea and chlamydia if:  You are sexually active and are younger than 24 years.  You are older than 24 years and your health care provider tells you that you are at risk for this type of infection.  Your sexual activity has changed since you were last screened and you are at an increased risk for chlamydia or gonorrhea. Ask your health care provider if you are at risk.  If you are at risk of being infected with HIV, it is recommended that you take a prescription medicine daily to prevent HIV infection. This is  called preexposure prophylaxis (PrEP). You are considered at risk if:  You are a heterosexual woman, are sexually active, and are at increased risk for HIV infection.  You take drugs by injection.  You are sexually active with a partner who has HIV.  Talk with your health care provider about whether you are at high risk of being infected with HIV. If you choose to begin PrEP, you should first be tested for HIV. You should then be tested every 3 months for as long as you are taking PrEP.  Osteoporosis is a disease in which the bones lose minerals and strength   with aging. This can result in serious bone fractures or breaks. The risk of osteoporosis can be identified using a bone density scan. Women ages 65 years and over and women at risk for fractures or osteoporosis should discuss screening with their health care providers. Ask your health care provider whether you should take a calcium supplement or vitamin D to reduce the rate of osteoporosis.  Menopause can be associated with physical symptoms and risks. Hormone replacement therapy is available to decrease symptoms and risks. You should talk to your health care provider about whether hormone replacement therapy is right for you.  Use sunscreen. Apply sunscreen liberally and repeatedly throughout the day. You should seek shade when your shadow is shorter than you. Protect yourself by wearing long sleeves, pants, a wide-brimmed hat, and sunglasses year round, whenever you are outdoors.  Once a month, do a whole body skin exam, using a mirror to look at the skin on your back. Tell your health care provider of new moles, moles that have irregular borders, moles that are larger than a pencil eraser, or moles that have changed in shape or color.  Stay current with required vaccines (immunizations).  Influenza vaccine. All adults should be immunized every year.  Tetanus, diphtheria, and acellular pertussis (Td, Tdap) vaccine. Pregnant women should  receive 1 dose of Tdap vaccine during each pregnancy. The dose should be obtained regardless of the length of time since the last dose. Immunization is preferred during the 27th-36th week of gestation. An adult who has not previously received Tdap or who does not know her vaccine status should receive 1 dose of Tdap. This initial dose should be followed by tetanus and diphtheria toxoids (Td) booster doses every 10 years. Adults with an unknown or incomplete history of completing a 3-dose immunization series with Td-containing vaccines should begin or complete a primary immunization series including a Tdap dose. Adults should receive a Td booster every 10 years.  Varicella vaccine. An adult without evidence of immunity to varicella should receive 2 doses or a second dose if she has previously received 1 dose. Pregnant females who do not have evidence of immunity should receive the first dose after pregnancy. This first dose should be obtained before leaving the health care facility. The second dose should be obtained 4-8 weeks after the first dose.  Human papillomavirus (HPV) vaccine. Females aged 13-26 years who have not received the vaccine previously should obtain the 3-dose series. The vaccine is not recommended for use in pregnant females. However, pregnancy testing is not needed before receiving a dose. If a female is found to be pregnant after receiving a dose, no treatment is needed. In that case, the remaining doses should be delayed until after the pregnancy. Immunization is recommended for any person with an immunocompromised condition through the age of 26 years if she did not get any or all doses earlier. During the 3-dose series, the second dose should be obtained 4-8 weeks after the first dose. The third dose should be obtained 24 weeks after the first dose and 16 weeks after the second dose.  Zoster vaccine. One dose is recommended for adults aged 60 years or older unless certain conditions are  present.  Measles, mumps, and rubella (MMR) vaccine. Adults born before 1957 generally are considered immune to measles and mumps. Adults born in 1957 or later should have 1 or more doses of MMR vaccine unless there is a contraindication to the vaccine or there is laboratory evidence of immunity to   each of the three diseases. A routine second dose of MMR vaccine should be obtained at least 28 days after the first dose for students attending postsecondary schools, health care workers, or international travelers. People who received inactivated measles vaccine or an unknown type of measles vaccine during 1963-1967 should receive 2 doses of MMR vaccine. People who received inactivated mumps vaccine or an unknown type of mumps vaccine before 1979 and are at high risk for mumps infection should consider immunization with 2 doses of MMR vaccine. For females of childbearing age, rubella immunity should be determined. If there is no evidence of immunity, females who are not pregnant should be vaccinated. If there is no evidence of immunity, females who are pregnant should delay immunization until after pregnancy. Unvaccinated health care workers born before 1957 who lack laboratory evidence of measles, mumps, or rubella immunity or laboratory confirmation of disease should consider measles and mumps immunization with 2 doses of MMR vaccine or rubella immunization with 1 dose of MMR vaccine.  Pneumococcal 13-valent conjugate (PCV13) vaccine. When indicated, a person who is uncertain of her immunization history and has no record of immunization should receive the PCV13 vaccine. An adult aged 19 years or older who has certain medical conditions and has not been previously immunized should receive 1 dose of PCV13 vaccine. This PCV13 should be followed with a dose of pneumococcal polysaccharide (PPSV23) vaccine. The PPSV23 vaccine dose should be obtained at least 8 weeks after the dose of PCV13 vaccine. An adult aged 19  years or older who has certain medical conditions and previously received 1 or more doses of PPSV23 vaccine should receive 1 dose of PCV13. The PCV13 vaccine dose should be obtained 1 or more years after the last PPSV23 vaccine dose.  Pneumococcal polysaccharide (PPSV23) vaccine. When PCV13 is also indicated, PCV13 should be obtained first. All adults aged 65 years and older should be immunized. An adult younger than age 65 years who has certain medical conditions should be immunized. Any person who resides in a nursing home or long-term care facility should be immunized. An adult smoker should be immunized. People with an immunocompromised condition and certain other conditions should receive both PCV13 and PPSV23 vaccines. People with human immunodeficiency virus (HIV) infection should be immunized as soon as possible after diagnosis. Immunization during chemotherapy or radiation therapy should be avoided. Routine use of PPSV23 vaccine is not recommended for American Indians, [place] Natives, or people younger than 65 years unless there are medical conditions that require PPSV23 vaccine. When indicated, people who have unknown immunization and have no record of immunization should receive PPSV23 vaccine. One-time revaccination 5 years after the first dose of PPSV23 is recommended for people aged 19-64 years who have chronic kidney failure, nephrotic syndrome, asplenia, or immunocompromised conditions. People who received 1-2 doses of PPSV23 before age 65 years should receive another dose of PPSV23 vaccine at age 65 years or later if at least 5 years have passed since the previous dose. Doses of PPSV23 are not needed for people immunized with PPSV23 at or after age 65 years.  Meningococcal vaccine. Adults with asplenia or persistent complement component deficiencies should receive 2 doses of quadrivalent meningococcal conjugate (MenACWY-D) vaccine. The doses should be obtained at least 2 months apart.  Microbiologists working with certain meningococcal bacteria, military recruits, people at risk during an outbreak, and people who travel to or live in countries with a high rate of meningitis should be immunized. A first-year college student up through age   21 years who is living in a residence hall should receive a dose if she did not receive a dose on or after her 16th birthday. Adults who have certain high-risk conditions should receive one or more doses of vaccine.  Hepatitis A vaccine. Adults who wish to be protected from this disease, have certain high-risk conditions, work with hepatitis A-infected animals, work in hepatitis A research labs, or travel to or work in countries with a high rate of hepatitis A should be immunized. Adults who were previously unvaccinated and who anticipate close contact with an international adoptee during the first 60 days after arrival in the [place] from a country with a high rate of hepatitis A should be immunized.  Hepatitis B vaccine. Adults who wish to be protected from this disease, have certain high-risk conditions, may be exposed to blood or other infectious body fluids, are household contacts or sex partners of hepatitis B positive people, are clients or workers in certain care facilities, or travel to or work in countries with a high rate of hepatitis B should be immunized.  Haemophilus influenzae type b (Hib) vaccine. A previously unvaccinated person with asplenia or sickle cell disease or having a scheduled splenectomy should receive 1 dose of Hib vaccine. Regardless of previous immunization, a recipient of a hematopoietic stem cell transplant should receive a 3-dose series 6-12 months after her successful transplant. Hib vaccine is not recommended for adults with HIV infection. Preventive Services / Frequency Ages 64 to 68 years  Blood pressure check.** / Every 1 to 2 years.  Lipid and cholesterol check.** / Every 5 years beginning at age  22.  Clinical breast exam.** / Every 3 years for women in their 88s and 53s.  BRCA-related cancer risk assessment.** / For women who have family members with a BRCA-related cancer (breast, ovarian, tubal, or peritoneal cancers).  Pap test.** / Every 2 years from ages 90 through 51. Every 3 years starting at age 21 through age 56 or 3 with a history of 3 consecutive normal Pap tests.  HPV screening.** / Every 3 years from ages 24 through ages 1 to 46 with a history of 3 consecutive normal Pap tests.  Hepatitis C blood test.** / For any individual with known risks for hepatitis C.  Skin self-exam. / Monthly.  Influenza vaccine. / Every year.  Tetanus, diphtheria, and acellular pertussis (Tdap, Td) vaccine.** / Consult your health care provider. Pregnant women should receive 1 dose of Tdap vaccine during each pregnancy. 1 dose of Td every 10 years.  Varicella vaccine.** / Consult your health care provider. Pregnant females who do not have evidence of immunity should receive the first dose after pregnancy.  HPV vaccine. / 3 doses over 6 months, if 72 and younger. The vaccine is not recommended for use in pregnant females. However, pregnancy testing is not needed before receiving a dose.  Measles, mumps, rubella (MMR) vaccine.** / You need at least 1 dose of MMR if you were born in 1957 or later. You may also need a 2nd dose. For females of childbearing age, rubella immunity should be determined. If there is no evidence of immunity, females who are not pregnant should be vaccinated. If there is no evidence of immunity, females who are pregnant should delay immunization until after pregnancy.  Pneumococcal 13-valent conjugate (PCV13) vaccine.** / Consult your health care provider.  Pneumococcal polysaccharide (PPSV23) vaccine.** / 1 to 2 doses if you smoke cigarettes or if you have certain conditions.  Meningococcal vaccine.** /  1 dose if you are age 19 to 21 years and a first-year college  student living in a residence hall, or have one of several medical conditions, you need to get vaccinated against meningococcal disease. You may also need additional booster doses.  Hepatitis A vaccine.** / Consult your health care provider.  Hepatitis B vaccine.** / Consult your health care provider.  Haemophilus influenzae type b (Hib) vaccine.** / Consult your health care provider. Ages 40 to 64 years  Blood pressure check.** / Every 1 to 2 years.  Lipid and cholesterol check.** / Every 5 years beginning at age 20 years.  Lung cancer screening. / Every year if you are aged 55-80 years and have a 30-pack-year history of smoking and currently smoke or have quit within the past 15 years. Yearly screening is stopped once you have quit smoking for at least 15 years or develop a health problem that would prevent you from having lung cancer treatment.  Clinical breast exam.** / Every year after age 40 years.  BRCA-related cancer risk assessment.** / For women who have family members with a BRCA-related cancer (breast, ovarian, tubal, or peritoneal cancers).  Mammogram.** / Every year beginning at age 40 years and continuing for as long as you are in good health. Consult with your health care provider.  Pap test.** / Every 3 years starting at age 30 years through age 65 or 70 years with a history of 3 consecutive normal Pap tests.  HPV screening.** / Every 3 years from ages 30 years through ages 65 to 70 years with a history of 3 consecutive normal Pap tests.  Fecal occult blood test (FOBT) of stool. / Every year beginning at age 50 years and continuing until age 75 years. You may not need to do this test if you get a colonoscopy every 10 years.  Flexible sigmoidoscopy or colonoscopy.** / Every 5 years for a flexible sigmoidoscopy or every 10 years for a colonoscopy beginning at age 50 years and continuing until age 75 years.  Hepatitis C blood test.** / For all people born from 1945 through  1965 and any individual with known risks for hepatitis C.  Skin self-exam. / Monthly.  Influenza vaccine. / Every year.  Tetanus, diphtheria, and acellular pertussis (Tdap/Td) vaccine.** / Consult your health care provider. Pregnant women should receive 1 dose of Tdap vaccine during each pregnancy. 1 dose of Td every 10 years.  Varicella vaccine.** / Consult your health care provider. Pregnant females who do not have evidence of immunity should receive the first dose after pregnancy.  Zoster vaccine.** / 1 dose for adults aged 60 years or older.  Measles, mumps, rubella (MMR) vaccine.** / You need at least 1 dose of MMR if you were born in 1957 or later. You may also need a 2nd dose. For females of childbearing age, rubella immunity should be determined. If there is no evidence of immunity, females who are not pregnant should be vaccinated. If there is no evidence of immunity, females who are pregnant should delay immunization until after pregnancy.  Pneumococcal 13-valent conjugate (PCV13) vaccine.** / Consult your health care provider.  Pneumococcal polysaccharide (PPSV23) vaccine.** / 1 to 2 doses if you smoke cigarettes or if you have certain conditions.  Meningococcal vaccine.** / Consult your health care provider.  Hepatitis A vaccine.** / Consult your health care provider.  Hepatitis B vaccine.** / Consult your health care provider.  Haemophilus influenzae type b (Hib) vaccine.** / Consult your health care provider. Ages 65   years and over  Blood pressure check.** / Every 1 to 2 years.  Lipid and cholesterol check.** / Every 5 years beginning at age 43 years.  Lung cancer screening. / Every year if you are aged 35-80 years and have a 30-pack-year history of smoking and currently smoke or have quit within the past 15 years. Yearly screening is stopped once you have quit smoking for at least 15 years or develop a health problem that would prevent you from having lung cancer  treatment.  Clinical breast exam.** / Every year after age 46 years.  BRCA-related cancer risk assessment.** / For women who have family members with a BRCA-related cancer (breast, ovarian, tubal, or peritoneal cancers).  Mammogram.** / Every year beginning at age 11 years and continuing for as long as you are in good health. Consult with your health care provider.  Pap test.** / Every 3 years starting at age 16 years through age 46 or 64 years with 3 consecutive normal Pap tests. Testing can be stopped between 65 and 70 years with 3 consecutive normal Pap tests and no abnormal Pap or HPV tests in the past 10 years.  HPV screening.** / Every 3 years from ages 50 years through ages 87 or 38 years with a history of 3 consecutive normal Pap tests. Testing can be stopped between 65 and 70 years with 3 consecutive normal Pap tests and no abnormal Pap or HPV tests in the past 10 years.  Fecal occult blood test (FOBT) of stool. / Every year beginning at age 26 years and continuing until age 76 years. You may not need to do this test if you get a colonoscopy every 10 years.  Flexible sigmoidoscopy or colonoscopy.** / Every 5 years for a flexible sigmoidoscopy or every 10 years for a colonoscopy beginning at age 64 years and continuing until age 58 years.  Hepatitis C blood test.** / For all people born from [birth year] through 1965 and any individual with known risks for hepatitis C.  Osteoporosis screening.** / A one-time screening for women ages 25 years and over and women at risk for fractures or osteoporosis.  Skin self-exam. / Monthly.  Influenza vaccine. / Every year.  Tetanus, diphtheria, and acellular pertussis (Tdap/Td) vaccine.** / 1 dose of Td every 10 years.  Varicella vaccine.** / Consult your health care provider.  Zoster vaccine.** / 1 dose for adults aged 62 years or older.  Pneumococcal 13-valent conjugate (PCV13) vaccine.** / Consult your health care provider.  Pneumococcal  polysaccharide (PPSV23) vaccine.** / 1 dose for all adults aged 45 years and older.  Meningococcal vaccine.** / Consult your health care provider.  Hepatitis A vaccine.** / Consult your health care provider.  Hepatitis B vaccine.** / Consult your health care provider.  Haemophilus influenzae type b (Hib) vaccine.** / Consult your health care provider. ** Family history and personal history of risk and conditions may change your health care provider's recommendations. Document Released: 10/25/2001 Document Revised: 01/13/2014 Document Reviewed: 01/24/2011 [facility] Patient Information 2015 [place], [place]. This information is not intended to replace advice given to you by your health care provider. Make sure you discuss any questions you have with your health care provider. Hypertension Hypertension, commonly called high blood pressure, is when the force of blood pumping through your arteries is too strong. Your arteries are the blood vessels that carry blood from your heart throughout your body. A blood pressure reading consists of a higher number over a lower number, such as 110/72. The higher number (  systolic) is the pressure inside your arteries when your heart pumps. The lower number (diastolic) is the pressure inside your arteries when your heart relaxes. Ideally you want your blood pressure below 120/80. Hypertension forces your heart to work harder to pump blood. Your arteries may become narrow or stiff. Having hypertension puts you at risk for heart disease, stroke, and other problems.  RISK FACTORS Some risk factors for high blood pressure are controllable. Others are not.  Risk factors you cannot control include:   Race. You may be at higher risk if you are African American.  Age. Risk increases with age.  Gender. Men are at higher risk than women before age 35 years. After age 25, women are at higher risk than men. Risk factors you can control include:  Not getting enough exercise  or physical activity.  Being overweight.  Getting too much fat, sugar, calories, or salt in your diet.  Drinking too much alcohol. SIGNS AND SYMPTOMS Hypertension does not usually cause signs or symptoms. Extremely high blood pressure (hypertensive crisis) may cause headache, anxiety, shortness of breath, and nosebleed. DIAGNOSIS  To check if you have hypertension, your health care provider will measure your blood pressure while you are seated, with your arm held at the level of your heart. It should be measured at least twice using the same arm. Certain conditions can cause a difference in blood pressure between your right and left arms. A blood pressure reading that is higher than normal on one occasion does not mean that you need treatment. If one blood pressure reading is high, ask your health care provider about having it checked again. TREATMENT  Treating high blood pressure includes making lifestyle changes and possibly taking medicine. Living a healthy lifestyle can help lower high blood pressure. You may need to change some of your habits. Lifestyle changes may include:  Following the DASH diet. This diet is high in fruits, vegetables, and whole grains. It is low in salt, red meat, and added sugars.  Getting at least 2 hours of brisk physical activity every week.  Losing weight if necessary.  Not smoking.  Limiting alcoholic beverages.  Learning ways to reduce stress. If lifestyle changes are not enough to get your blood pressure under control, your health care provider may prescribe medicine. You may need to take more than one. Work closely with your health care provider to understand the risks and benefits. HOME CARE INSTRUCTIONS  Have your blood pressure rechecked as directed by your health care provider.   Take medicines only as directed by your health care provider. Follow the directions carefully. Blood pressure medicines must be taken as prescribed. The medicine does  not work as well when you skip doses. Skipping doses also puts you at risk for problems.   Do not smoke.   Monitor your blood pressure at home as directed by your health care provider. SEEK MEDICAL CARE IF:   You think you are having a reaction to medicines taken.  You have recurrent headaches or feel dizzy.  You have swelling in your ankles.  You have trouble with your vision. SEEK IMMEDIATE MEDICAL CARE IF:  You develop a severe headache or confusion.  You have unusual weakness, numbness, or feel faint.  You have severe chest or abdominal pain.  You vomit repeatedly.  You have trouble breathing. MAKE SURE YOU:   Understand these instructions.  Will watch your condition.  Will get help right away if you are not doing well or get  worse. Document Released: 08/29/2005 Document Revised: 01/13/2014 Document Reviewed: 06/21/2013 [facility] Patient Information 2015 [place], [place]. This information is not intended to replace advice given to you by your health care provider. Make sure you discuss any questions you have with your health care provider.

## 2014-05-29 NOTE — Progress Notes (Signed)
Subjective:    Patient ID: Marissa [name], female    DOB: [birth year], 73 y.o.   MRN: [id number]  Hypertension This is a chronic problem. The current episode started more than 1 year ago. The problem has been gradually worsening since onset. The problem is uncontrolled. Associated symptoms include chest pain (intermittent aching across her chest), headaches and malaise/fatigue. Pertinent negatives include no anxiety, blurred vision, neck pain, orthopnea, palpitations, peripheral edema, PND, shortness of breath or sweats. There are no associated agents to hypertension. Past treatments include angiotensin blockers, diuretics and calcium channel blockers (she has been out of her meds for several weeks). Compliance problems include diet, exercise and psychosocial issues.  Hypertensive end-organ damage includes kidney disease. Identifiable causes of hypertension include chronic renal disease.      Review of Systems  Constitutional: Positive for malaise/fatigue. Negative for fever, chills, diaphoresis, appetite change and fatigue.  Eyes: Negative.  Negative for blurred vision.  Respiratory: Negative.  Negative for apnea, cough, choking, chest tightness, shortness of breath, wheezing and stridor.   Cardiovascular: Positive for chest pain (intermittent aching across her chest). Negative for palpitations, orthopnea, leg swelling and PND.  Gastrointestinal: Negative.  Negative for nausea, vomiting, abdominal pain, diarrhea, constipation and blood in stool.  Endocrine: Negative.   Genitourinary: Negative.   Musculoskeletal: Negative.  Negative for arthralgias, back pain, joint swelling, myalgias and neck pain.  Skin: Negative.  Negative for rash.  Allergic/Immunologic: Negative.   Neurological: Positive for dizziness and headaches. Negative for tremors, seizures, syncope, facial asymmetry, speech difficulty, weakness, light-headedness and numbness.  Hematological: Negative.  Negative for adenopathy. Does  not bruise/bleed easily.  Psychiatric/Behavioral: Negative.        Objective:   Physical Exam  Vitals reviewed. Constitutional: She is oriented to person, place, and time. She appears well-developed and well-nourished. No distress.  HENT:  Head: Normocephalic and atraumatic.  Mouth/Throat: Oropharynx is clear and moist.  Eyes: Conjunctivae are normal. Right eye exhibits no discharge. Left eye exhibits no discharge. No scleral icterus.  Neck: Normal range of motion. Neck supple. No JVD present. No tracheal deviation present. No thyromegaly present.  Cardiovascular: Normal rate, regular rhythm, normal heart sounds and intact distal pulses.  Exam reveals no gallop and no friction rub.   No murmur heard. Pulmonary/Chest: Effort normal and breath sounds normal. No stridor. No respiratory distress. She has no wheezes. She has no rales. She exhibits no tenderness.  Abdominal: Soft. Bowel sounds are normal. She exhibits no distension and no mass. There is no tenderness. There is no rebound and no guarding.  Musculoskeletal: Normal range of motion. She exhibits no edema and no tenderness.  Lymphadenopathy:    She has no cervical adenopathy.  Neurological: She is oriented to person, place, and time.  Skin: Skin is warm and dry. No rash noted. She is not diaphoretic. No erythema. No pallor.  Psychiatric: She has a normal mood and affect. Judgment and thought content normal. Her mood appears not anxious. Her affect is not angry, not blunt, not labile and not inappropriate. Her speech is delayed and tangential. Her speech is not rapid and/or pressured and not slurred. She is slowed and withdrawn. She is not actively hallucinating. Cognition and memory are impaired. She does not exhibit a depressed mood. She is communicative. She exhibits abnormal recent memory and abnormal remote memory. She is inattentive.     Lab Results  Component Value Date   WBC 6.9 03/29/2013   HGB 11.6* 03/29/2013   HCT 36.6  03/29/2013   PLT 212.0 03/29/2013   GLUCOSE 97 03/29/2013   CHOL 139 03/29/2013   TRIG 82.0 03/29/2013   HDL 53.10 03/29/2013   LDLCALC 70 03/29/2013   ALT 15 03/29/2013   AST 19 03/29/2013   NA 135 03/29/2013   K 4.4 03/29/2013   CL 109 03/29/2013   CREATININE 1.5* 03/29/2013   BUN 35* 03/29/2013   CO2 19 03/29/2013   TSH 1.98 03/29/2013   INR 1.13 08/01/2012   HGBA1C 5.8 11/23/2011   MICROALBUR 0.50 04/03/2009       Assessment & Plan:

## 2014-05-29 NOTE — Progress Notes (Signed)
Pre visit review using our clinic review tool, if applicable. No additional management support is needed unless otherwise documented below in the visit note. 

## 2014-05-30 DIAGNOSIS — Z0000 Encounter for general adult medical examination without abnormal findings: Secondary | ICD-10-CM | POA: Insufficient documentation

## 2014-05-30 DIAGNOSIS — R079 Chest pain, unspecified: Secondary | ICD-10-CM | POA: Insufficient documentation

## 2014-05-30 NOTE — Assessment & Plan Note (Signed)
She does not want to treat this 

## 2014-05-30 NOTE — Assessment & Plan Note (Signed)

## 2014-05-30 NOTE — Assessment & Plan Note (Signed)
Her BP is not well controlled as she has not had her meds for several weeks Will restart her usual meds Will monitor her lytes and renal function today

## 2014-05-30 NOTE — Assessment & Plan Note (Signed)
Her renal function is stable Will try to get better BP control She will avoid nsaids

## 2014-05-30 NOTE — Assessment & Plan Note (Signed)
Her chest pain does not sound like angina, her EKG is normal and her enzymes are negative I have asked her to have her annual f/up with cardiology and to use NTG if needed

## 2014-05-30 NOTE — Assessment & Plan Note (Signed)
I will recheck her CBC today 

## 2014-06-18 ENCOUNTER — Ambulatory Visit: Payer: Medicare Other

## 2014-06-27 ENCOUNTER — Encounter: Payer: Self-pay | Admitting: Cardiology

## 2014-06-27 ENCOUNTER — Encounter: Payer: Self-pay | Admitting: *Deleted

## 2014-06-27 ENCOUNTER — Ambulatory Visit (INDEPENDENT_AMBULATORY_CARE_PROVIDER_SITE_OTHER): Payer: Medicare Other | Admitting: Cardiology

## 2014-06-27 VITALS — BP 126/64 | HR 77 | Ht 64.0 in | Wt 234.0 lb

## 2014-06-27 DIAGNOSIS — I2510 Atherosclerotic heart disease of native coronary artery without angina pectoris: Secondary | ICD-10-CM

## 2014-06-27 DIAGNOSIS — G4733 Obstructive sleep apnea (adult) (pediatric): Secondary | ICD-10-CM

## 2014-06-27 DIAGNOSIS — E785 Hyperlipidemia, unspecified: Secondary | ICD-10-CM

## 2014-06-27 DIAGNOSIS — R0602 Shortness of breath: Secondary | ICD-10-CM

## 2014-06-27 DIAGNOSIS — I10 Essential (primary) hypertension: Secondary | ICD-10-CM

## 2014-06-27 DIAGNOSIS — I5032 Chronic diastolic (congestive) heart failure: Secondary | ICD-10-CM

## 2014-06-27 DIAGNOSIS — R079 Chest pain, unspecified: Secondary | ICD-10-CM

## 2014-06-27 LAB — BASIC METABOLIC PANEL
BUN: 29 mg/dL — ABNORMAL HIGH (ref 6–23)
CHLORIDE: 109 meq/L (ref 96–112)
CO2: 18 mEq/L — ABNORMAL LOW (ref 19–32)
Calcium: 9.4 mg/dL (ref 8.4–10.5)
Creat: 1.64 mg/dL — ABNORMAL HIGH (ref 0.50–1.10)
GLUCOSE: 80 mg/dL (ref 70–99)
POTASSIUM: 4.3 meq/L (ref 3.5–5.3)
Sodium: 137 mEq/L (ref 135–145)

## 2014-06-27 LAB — BRAIN NATRIURETIC PEPTIDE: BRAIN NATRIURETIC PEPTIDE: 25.2 pg/mL (ref 0.0–100.0)

## 2014-06-27 MED ORDER — POTASSIUM CHLORIDE CRYS ER 20 MEQ PO TBCR: 20.0000 meq | EXTENDED_RELEASE_TABLET | Freq: Every day | ORAL | Status: DC

## 2014-06-27 MED ORDER — FUROSEMIDE 20 MG PO TABS: 20.0000 mg | ORAL_TABLET | Freq: Every day | ORAL | Status: DC

## 2014-06-27 NOTE — Patient Instructions (Signed)
Start lasix 20mg  daily.  Start KCl 20 mEq daily.  Your physician recommends that you have  lab work today--BMET/BNP  Your physician has requested that you have an echocardiogram. Echocardiography is a painless test that uses sound waves to create images of your heart. It provides your doctor with information about the size and shape of your heart and how well your heart's chambers and valves are working. This procedure takes approximately one hour. There are no restrictions for this procedure.  Your physician has requested that you have a lexiscan myoview. For further information please visit [email or web address]. Please follow instruction sheet, as given.  Your physician recommends that you schedule a follow-up appointment the end of next week.Please have testing done before you see Dr Aundra Dubin the end of next week.

## 2014-06-29 ENCOUNTER — Encounter: Payer: Self-pay | Admitting: Cardiology

## 2014-06-29 DIAGNOSIS — I5032 Chronic diastolic (congestive) heart failure: Secondary | ICD-10-CM | POA: Insufficient documentation

## 2014-06-29 HISTORY — DX: Chronic diastolic (congestive) heart failure: I50.32

## 2014-06-29 NOTE — Progress Notes (Signed)
Patient ID: Marissa [name], female   DOB: 1941/06/08, 73 y.o.   MRN: [id number] PCP: Dr. Ronnald Ramp  73 yo with history of CAD and HTN presents for cardiology followup.  She has a history of intervention on an OM.  She had a Lexiscan Cardiolite in 6/14 that showed no ischemia or infarction.  Echo in 6/14 showed EF 55-60%.  Recently, she has developed increased exertional dyspnea.  She is short of breath after walking 50 feet.  She has frequent episodes of chest pain. They are not necessarily exertional.  She has some difficulty described how the chest pain presents.  She has 2-3 pillow orthopnea, no PND.  +Bendopnea.  When last lipids were done, LDL was too high but she had been off atorvastatin.  She is fatigued during the day and has not been using her CPAP (missing a piece).    Labs (1/13): LDL 133, HDL 48, HCT 36.3, K 4.1, creatinine 1.23 => 1.4, BNP 58 Labs (12/13): K 4.4, creatinine 1.45 Labs (7/14): K 4.4, creatinine 1.5, LDL 70, HDL 53, BNP 63 Labs (9/15): K 3.7, creatinine 1.5, LDL 135, HDL 51  ECG: NSR, normal  PMH: 1. Anemia 2. Diabetes: diet-controlled. 3. HTN 4. GERD 5. History of diverticulitis 6. Hyperlipidemia 7. Schatzski's ring 8. Fibroids: s/p TAH-BSO in 11/13 for vaginal bleeding.  9. CAD: 8/07 had BMS to OM.  In-stent restenosis with later Promus DES to same site.  Lexiscan myoview in 1/13 showed EF 67%, no ischemia or infarction.  Echo (2/13) showed EF 55-60%, moderate LVH, mild MR.  Lexiscan Cardiolite in 6/14 with EF 53%, no ischemia/infarction.  Echo (6/14) with EF 55-60%, mild LVH, mild MR.  10. Peripheral neuropathy 11. HTN 12. CKD  SH: Single with 1 daughter.  She does not smoke.   FH: No premature CAD  ROS: All systems reviewed and negative except as per HPI.   Current Outpatient Prescriptions  Medication Sig Dispense Refill  . amLODipine (NORVASC) 10 MG tablet Take 1 tablet (10 mg total) by mouth daily.  30 tablet  11  . aspirin EC 81 MG tablet 1 tablet every  other day      . atorvastatin (LIPITOR) 20 MG tablet Take 1 tablet (20 mg total) by mouth daily.  30 tablet  11  . nitroGLYCERIN (NITROSTAT) 0.4 MG SL tablet Place 1 tablet (0.4 mg total) under the tongue every 5 (five) minutes as needed for chest pain.  100 tablet  3  . olmesartan-hydrochlorothiazide (BENICAR HCT) 20-12.5 MG per tablet Take 1 tablet by mouth every morning.  30 tablet  11  . furosemide (LASIX) 20 MG tablet Take 1 tablet (20 mg total) by mouth daily.  30 tablet  1  . potassium chloride SA (K-DUR,KLOR-CON) 20 MEQ tablet Take 1 tablet (20 mEq total) by mouth daily.  30 tablet  1   No current facility-administered medications for this visit.    BP 126/64  Pulse 77  Ht 5\' 4"  (1.626 m)  Wt 234 lb (106.142 kg)  BMI 40.15 kg/m2 General: NAD, obese. Neck: JVP 8 cm, no thyromegaly or thyroid nodule.  Lungs: Clear to auscultation bilaterally with normal respiratory effort. CV: Nondisplaced PMI.  Heart regular S1/S2, no S3/S4, no murmur.  1+ ankle edema.  No carotid bruit.  2+ DP pulses bilaterally.  Abdomen: Soft, nontender, no hepatosplenomegaly, no distention.  Neurologic: Alert and oriented x 3.  Psych: Normal affect. Extremities: No clubbing or cyanosis.   Assessment/Plan: 1. HTN: BP controlled on current  medications.   2. Hyperlipidemia: History of CAD.  She recently restarted her atorvastatin.  Will check lipids/LFTs in 2 months.  3. CAD: Patient has a history of CAD with OM PCI.  Currently, she has been having increased exertional dyspnea as well as atypical chest pain.   - I will arrange for Lexiscan Cardiolite to assess for ischemia.   - Continue ASA 81, statin.  4. Chronic diastolic CHF: EF normal on 6/14 echo. NYHA class III symptoms with some evidence for volume overload.  - Start Lasix 20 mg daily with KCl 20 mEq daily.  - Echo  5. OSA: I encouraged her to call for a replacement piece so she can start using her CPAP.  6. CKD: Creatinine has been stable.  Follow on  Lasix.   Loralie Champagne 06/29/2014

## 2014-07-01 ENCOUNTER — Ambulatory Visit (HOSPITAL_COMMUNITY): Payer: Medicare Other | Attending: Cardiovascular Disease | Admitting: Radiology

## 2014-07-01 VITALS — BP 132/69 | HR 66 | Ht 64.0 in | Wt 230.0 lb

## 2014-07-01 DIAGNOSIS — R079 Chest pain, unspecified: Secondary | ICD-10-CM | POA: Insufficient documentation

## 2014-07-01 DIAGNOSIS — I2510 Atherosclerotic heart disease of native coronary artery without angina pectoris: Secondary | ICD-10-CM | POA: Insufficient documentation

## 2014-07-01 DIAGNOSIS — I10 Essential (primary) hypertension: Secondary | ICD-10-CM | POA: Diagnosis not present

## 2014-07-01 DIAGNOSIS — R0609 Other forms of dyspnea: Secondary | ICD-10-CM | POA: Diagnosis not present

## 2014-07-01 DIAGNOSIS — E785 Hyperlipidemia, unspecified: Secondary | ICD-10-CM | POA: Diagnosis not present

## 2014-07-01 DIAGNOSIS — R5383 Other fatigue: Secondary | ICD-10-CM | POA: Insufficient documentation

## 2014-07-01 DIAGNOSIS — R0602 Shortness of breath: Secondary | ICD-10-CM

## 2014-07-01 MED ORDER — REGADENOSON 0.4 MG/5ML IV SOLN
0.4000 mg | Freq: Once | INTRAVENOUS | Status: AC
  Administered 2014-07-01: 0.4 mg via INTRAVENOUS

## 2014-07-01 MED ORDER — TECHNETIUM TC 99M SESTAMIBI GENERIC - CARDIOLITE
33.0000 | Freq: Once | INTRAVENOUS | Status: AC | PRN
  Administered 2014-07-01: 33 via INTRAVENOUS

## 2014-07-01 NOTE — Progress Notes (Signed)
  [facility] [address] [place], [place] [address] [phone]    Cardiology Nuclear Med Study  Marissa [name] is a 73 y.o. female     MRN : [id number]     DOB: [birth year]  Procedure Date: 07/01/2014  Nuclear Med Background Indication for Stress Test:  Evaluation for Ischemia History:  CAD, MPI 2014 (normal) EF 53% Cardiac Risk Factors: Hypertension and Lipids  Symptoms:  Chest Pain (last date of chest discomfort was yesterday), DOE, Fatigue and Light-Headedness   Nuclear Pre-Procedure Caffeine/Decaff Intake:  None NPO After: 7:00pm   Lungs:  clear O2 Sat: 97% on room air. IV 0.9% NS with Angio Cath:  24g  IV Site: R Wrist  IV Started by:  Matilde Haymaker, RN  Chest Size (in):  40 Cup Size: B  Height: 5\' 4"  (1.626 m)  Weight:  230 lb (104.327 kg)  BMI:  Body mass index is 39.46 kg/(m^2). Tech Comments:  n/a    Nuclear Med Study 1 or 2 day study: 2 day  Stress Test Type:  Lexiscan  Reading MD: n/a  Order Authorizing Provider:  Theador Hawthorne   Resting Radionuclide: Technetium 11m Sestamibi  Resting Radionuclide Dose: 33.0 mCi  On     07-02-14  Stress Radionuclide:  Technetium 5m Sestamibi  Stress Radionuclide Dose: 33.0 mCi  On        07-01-14          Stress Protocol Rest HR: 66 Stress HR: 86  Rest BP: 132/69 Stress BP: 129/92  Exercise Time (min): n/a METS: n/a           Dose of Adenosine (mg):  n/a Dose of Lexiscan: 0.4 mg  Dose of Atropine (mg): n/a Dose of Dobutamine: n/a mcg/kg/min (at max HR)  Stress Test Technologist: Glade Lloyd, BS-ES  Nuclear Technologist:  Margie Ege     Rest Procedure:  Myocardial perfusion imaging was performed at rest 45 minutes following the intravenous administration of Technetium 69m Sestamibi. Rest ECG: Normal sinus rhythm. Normal EKG  Stress Procedure:  The patient received IV Lexiscan 0.4 mg over 15-seconds.  Technetium 47m Sestamibi injected at 30-seconds.  Quantitative  spect images were obtained after a 45 minute delay.  During the infusion of Lexiscan the patient complained of SOB and tightness in her ears.  These symptoms subsided in recovery.  Stress ECG: No significant change from baseline ECG  QPS Raw Data Images:  Normal; no motion artifact; normal heart/lung ratio. Stress Images:  Very small areas of slight decreased uptake. These are not significant. Rest Images:  Rest images are the same as the stress images. Subtraction (SDS):  No evidence of ischemia. Transient Ischemic Dilatation (Normal <1.22):  0.99 Lung/Heart Ratio (Normal <0.45):  0.29  Quantitative Gated Spect Images QGS EDV:  96 ml QGS ESV:  46 ml  Impression Exercise Capacity:  Lexiscan with no exercise. BP Response:  Normal blood pressure response. Clinical Symptoms:  Shortness of breath and tightness in her ears ECG Impression:  No significant ST segment change suggestive of ischemia. Comparison with Prior Nuclear Study: No images to compare  Overall Impression:  Normal stress nuclear study. There is no definite scar or ischemia. This is a low risk scan.  LV Ejection Fraction: 52%.  LV Wall Motion:  Normal Wall Motion.  Dola Argyle, MD

## 2014-07-02 ENCOUNTER — Ambulatory Visit (HOSPITAL_COMMUNITY): Payer: Medicare Other | Attending: Cardiology

## 2014-07-02 ENCOUNTER — Ambulatory Visit (HOSPITAL_BASED_OUTPATIENT_CLINIC_OR_DEPARTMENT_OTHER): Payer: Medicare Other

## 2014-07-02 DIAGNOSIS — I10 Essential (primary) hypertension: Secondary | ICD-10-CM | POA: Diagnosis not present

## 2014-07-02 DIAGNOSIS — R0602 Shortness of breath: Secondary | ICD-10-CM

## 2014-07-02 DIAGNOSIS — R079 Chest pain, unspecified: Secondary | ICD-10-CM | POA: Diagnosis present

## 2014-07-02 DIAGNOSIS — E119 Type 2 diabetes mellitus without complications: Secondary | ICD-10-CM | POA: Diagnosis not present

## 2014-07-02 DIAGNOSIS — E785 Hyperlipidemia, unspecified: Secondary | ICD-10-CM | POA: Insufficient documentation

## 2014-07-02 DIAGNOSIS — R0989 Other specified symptoms and signs involving the circulatory and respiratory systems: Secondary | ICD-10-CM

## 2014-07-02 MED ORDER — TECHNETIUM TC 99M SESTAMIBI GENERIC - CARDIOLITE
30.0000 | Freq: Once | INTRAVENOUS | Status: AC | PRN
  Administered 2014-07-02: 30 via INTRAVENOUS

## 2014-07-02 NOTE — Progress Notes (Signed)
2D Echo completed. 07/02/2014

## 2014-07-04 ENCOUNTER — Encounter: Payer: Self-pay | Admitting: Cardiology

## 2014-07-04 ENCOUNTER — Ambulatory Visit (INDEPENDENT_AMBULATORY_CARE_PROVIDER_SITE_OTHER): Payer: Medicare Other | Admitting: Cardiology

## 2014-07-04 VITALS — BP 133/80 | HR 75 | Ht 64.0 in | Wt 230.0 lb

## 2014-07-04 DIAGNOSIS — Z79899 Other long term (current) drug therapy: Secondary | ICD-10-CM

## 2014-07-04 DIAGNOSIS — N189 Chronic kidney disease, unspecified: Secondary | ICD-10-CM

## 2014-07-04 DIAGNOSIS — I2510 Atherosclerotic heart disease of native coronary artery without angina pectoris: Secondary | ICD-10-CM

## 2014-07-04 DIAGNOSIS — I5032 Chronic diastolic (congestive) heart failure: Secondary | ICD-10-CM

## 2014-07-04 DIAGNOSIS — E785 Hyperlipidemia, unspecified: Secondary | ICD-10-CM

## 2014-07-04 DIAGNOSIS — G4733 Obstructive sleep apnea (adult) (pediatric): Secondary | ICD-10-CM

## 2014-07-04 NOTE — Patient Instructions (Signed)
The current medical regimen is effective;  continue present plan and medications.  Please have blood work in 2 weeks (BMP)  Please have fasting blood work in December (lipid and Liver)  You have been referral back to see Dr Gwenette Greet for CPAP.  Follow up in 6 months with Dr. Aundra Dubin.  You will receive a letter in the mail 2 months before you are due.  Please call us when you receive this letter to schedule your follow up appointment.

## 2014-07-06 NOTE — Progress Notes (Signed)
Patient ID: Marissa [name], female   DOB: 1940-09-15, 73 y.o.   MRN: [id number] PCP: Dr. Ronnald Ramp  73 yo with history of CAD and HTN presents for cardiology followup.  She has a history of intervention on an OM.  She had a Lexiscan Cardiolite in 6/14 that showed no ischemia or infarction.  Echo in 6/14 showed EF 55-60%.  Recently, she developed increased exertional dyspnea.  She has been short of breath after walking 50 feet.  She has frequent episodes of chest pain. They are not necessarily exertional.  She has some difficulty described how the chest pain presents.  She had 2-3 pillow orthopnea, no PND.  She is fatigued during the day and has not been using her CPAP (missing a piece).    After last appointment, I had her do a Agricultural consultant.  This showed no ischemia or infarction.  Repeat echo showed mild LVH and EF 55-60%.  I started her on Lasix 20 mg daily.  She says that she is breathing a little better.  She is still fatigued/short of breath after walking about 100 feet. She has lost 4 lbs.  She is still not using CPAP.   Labs (1/13): LDL 133, HDL 48, HCT 36.3, K 4.1, creatinine 1.23 => 1.4, BNP 58 Labs (12/13): K 4.4, creatinine 1.45 Labs (7/14): K 4.4, creatinine 1.5, LDL 70, HDL 53, BNP 63 Labs (9/15): K 3.7, creatinine 1.5, LDL 135, HDL 51, HCT 37.3, B12 normal, TSH normal Labs (10/15): K 4.3, creatinine 1.64, BNP 25  ECG: NSR, normal  PMH: 1. Anemia 2. Diabetes: diet-controlled. 3. HTN 4. GERD 5. History of diverticulitis 6. Hyperlipidemia 7. Schatzski's ring 8. Fibroids: s/p TAH-BSO in 11/13 for vaginal bleeding.  9. CAD: 8/07 had BMS to OM.  In-stent restenosis with later Promus DES to same site.  Lexiscan myoview in 1/13 showed EF 67%, no ischemia or infarction.  Echo (2/13) showed EF 55-60%, moderate LVH, mild MR.  Lexiscan Cardiolite in 6/14 with EF 53%, no ischemia/infarction.  Echo (6/14) with EF 55-60%, mild LVH, mild MR. Lexiscan Cardiolite (10/15) with EF 52%, no  ischemia/infarction.  10. Peripheral neuropathy 11. HTN 12. CKD 13. Chronic diastolic CHF: Echo (68/11) with EF 55-60%, mild MR, mild LVH  SH: Single with 1 daughter.  She does not smoke.   FH: No premature CAD  ROS: All systems reviewed and negative except as per HPI.   Current Outpatient Prescriptions  Medication Sig Dispense Refill  . amLODipine (NORVASC) 10 MG tablet Take 1 tablet (10 mg total) by mouth daily.  30 tablet  11  . aspirin EC 81 MG tablet 1 tablet every other day      . atorvastatin (LIPITOR) 20 MG tablet Take 1 tablet (20 mg total) by mouth daily.  30 tablet  11  . furosemide (LASIX) 20 MG tablet Take 1 tablet (20 mg total) by mouth daily.  30 tablet  1  . nitroGLYCERIN (NITROSTAT) 0.4 MG SL tablet Place 1 tablet (0.4 mg total) under the tongue every 5 (five) minutes as needed for chest pain.  100 tablet  3  . olmesartan-hydrochlorothiazide (BENICAR HCT) 20-12.5 MG per tablet Take 1 tablet by mouth every morning.  30 tablet  11  . potassium chloride SA (K-DUR,KLOR-CON) 20 MEQ tablet Take 1 tablet (20 mEq total) by mouth daily.  30 tablet  1   No current facility-administered medications for this visit.    BP 133/80  Pulse 75  Ht 5\' 4"  (1.626 m)  Wt 230 lb (104.327 kg)  BMI 39.46 kg/m2 General: NAD, obese. Neck: JVP 7 cm, no thyromegaly or thyroid nodule.  Lungs: Clear to auscultation bilaterally with normal respiratory effort. CV: Nondisplaced PMI.  Heart regular S1/S2, no S3/S4, no murmur. No edema.  No carotid bruit.  2+ DP pulses bilaterally.  Abdomen: Soft, nontender, no hepatosplenomegaly, no distention.  Neurologic: Alert and oriented x 3.  Psych: Normal affect. Extremities: No clubbing or cyanosis.   Assessment/Plan: 1. HTN: BP controlled on current medications.   2. Hyperlipidemia: History of CAD.  She recently restarted her atorvastatin.  Will check lipids/LFTs in 2 months.  3. CAD: Patient has a history of CAD with OM PCI.  Recent Cardiolite  showed no ischemia or infarction.   - Continue ASA 81, statin.  4. Chronic diastolic CHF: EF normal on 10/15 echo. NYHA class III symptoms still but volume appears better.  No JVD.  - Continue Lasix 20 mg daily with KCl 20 mEq daily.   5. OSA: I think that a lot of her fatigue at this point may be due to untreated OSA.  I will get her an appointment with Dr Gwenette Greet to get back on her CPAP.  6. CKD: Creatinine has been stable.  Follow on Lasix.  Will repeat BMET in 2 wks.   Loralie Champagne 73/25/2015

## 2014-07-18 ENCOUNTER — Other Ambulatory Visit (INDEPENDENT_AMBULATORY_CARE_PROVIDER_SITE_OTHER): Payer: Medicare Other | Admitting: *Deleted

## 2014-07-18 ENCOUNTER — Other Ambulatory Visit: Payer: Self-pay | Admitting: *Deleted

## 2014-07-18 DIAGNOSIS — I5032 Chronic diastolic (congestive) heart failure: Secondary | ICD-10-CM

## 2014-07-18 DIAGNOSIS — N189 Chronic kidney disease, unspecified: Secondary | ICD-10-CM

## 2014-07-18 DIAGNOSIS — G4733 Obstructive sleep apnea (adult) (pediatric): Secondary | ICD-10-CM

## 2014-07-18 LAB — BASIC METABOLIC PANEL
BUN: 51 mg/dL — ABNORMAL HIGH (ref 6–23)
CO2: 19 meq/L (ref 19–32)
Calcium: 9.8 mg/dL (ref 8.4–10.5)
Chloride: 111 mEq/L (ref 96–112)
Creatinine, Ser: 2.2 mg/dL — ABNORMAL HIGH (ref 0.4–1.2)
GFR: 28.38 mL/min — ABNORMAL LOW (ref >60.00)
GLUCOSE: 109 mg/dL — AB (ref 70–99)
POTASSIUM: 5.1 meq/L (ref 3.5–5.1)
Sodium: 138 mEq/L (ref 135–145)

## 2014-07-21 ENCOUNTER — Ambulatory Visit (INDEPENDENT_AMBULATORY_CARE_PROVIDER_SITE_OTHER): Payer: Medicare Other | Admitting: Pulmonary Disease

## 2014-07-21 ENCOUNTER — Encounter (INDEPENDENT_AMBULATORY_CARE_PROVIDER_SITE_OTHER): Payer: Self-pay

## 2014-07-21 ENCOUNTER — Encounter: Payer: Self-pay | Admitting: Pulmonary Disease

## 2014-07-21 VITALS — BP 124/72 | HR 69 | Temp 98.0°F | Ht 64.0 in | Wt 234.4 lb

## 2014-07-21 DIAGNOSIS — G4733 Obstructive sleep apnea (adult) (pediatric): Secondary | ICD-10-CM

## 2014-07-21 NOTE — Progress Notes (Signed)
   Subjective:    Patient ID: Marissa [name], female    DOB: May 09, 1941, 73 y.o.   MRN: [id number]  HPI The patient comes in today for follow-up of her obstructive sleep apnea. She is having difficulty wearing her CPAP because of difficulty affording supplies, and she never got a new C Pap device because of the co-pay. She feels that she is not sleeping well, and appears sleepy today.    Review of Systems  Constitutional: Negative for fever and unexpected weight change.  HENT: Negative for congestion, dental problem, ear pain, nosebleeds, postnasal drip, rhinorrhea, sinus pressure, sneezing, sore throat and trouble swallowing.   Eyes: Negative for redness and itching.  Respiratory: Negative for cough, chest tightness, shortness of breath and wheezing.   Cardiovascular: Negative for palpitations and leg swelling.  Gastrointestinal: Negative for nausea and vomiting.  Genitourinary: Negative for dysuria.  Musculoskeletal: Negative for joint swelling.  Skin: Negative for rash.  Neurological: Negative for headaches.  Hematological: Does not bruise/bleed easily.  Psychiatric/Behavioral: Negative for dysphoric mood. The patient is not nervous/anxious.        Objective:   Physical Exam Overweight female in no acute distress Nose without purulence or discharge noted No skin breakdown or pressure necrosis from the C Pap mask Neck without lymphadenopathy or thyromegaly Lower extremities with edema noted, no cyanosis Alert and oriented, moves all 4 extremities.       Assessment & Plan:

## 2014-07-21 NOTE — Patient Instructions (Signed)
Will see if we can work on the financial issues to either get you a new machine or new supplies. Work on weight loss If doing well, followup with me again in one year.

## 2014-07-21 NOTE — Assessment & Plan Note (Signed)
The patient is doing very poorly with C Pap because she cannot afford any of the new supplies, and never did get a new machine. I will see if there is a pathway that we can get her a new device, but it least get her new supplies so she can wear her device. I also stressed to her the importance of working on weight loss. She will follow-up with me again in one year, but she is to call if things are not going well.

## 2014-07-28 ENCOUNTER — Telehealth: Payer: Self-pay | Admitting: Pulmonary Disease

## 2014-07-28 ENCOUNTER — Other Ambulatory Visit: Payer: Medicare Other

## 2014-07-28 NOTE — Telephone Encounter (Signed)
lmomtcb x1 for pt 

## 2014-07-30 NOTE — Telephone Encounter (Signed)
No documentation in pt's chart where our office has left her a message I do see where order was sent to [facility] and receipt was confirmed on 11.10.15 Rhonda, did you by chance call pt?  Thanks. *Called spoke with patient who could not provide name of person whom left the message.  She is aware we will be back in touch.

## 2014-07-30 NOTE — Telephone Encounter (Signed)
Called and spoke to pt. Advised pt that [facility] was the number that contacted her. Pt stated she received a call from [facility] telling pt she will need to go to [address] at 6pm. Pt unaware of where to go. Advised pt she will need to call Morton back to gather more information. Pt verbalized understanding and denied any further questions or concerns at this time.

## 2014-07-30 NOTE — Telephone Encounter (Signed)
I'm not sure what sleep study she is speaking about. Pt came and saw Dr. Gwenette Greet on 07/21/14 the same day patient was in my office and order was sent to [facility]. Pt was aware of this order. [facility] haven't contacted patient about this order, it was probably [facility] that contacted the patient. Called and spoke with [facility] and she stated that [facility] was trying to contact patient to address our order on 07/21/14. Pt has not been answering the phone per [facility]. Melissa gave [facility] the pt's cell phone #. Rhonda J Cobb

## 2014-08-12 ENCOUNTER — Other Ambulatory Visit: Payer: Medicare Other

## 2014-10-27 ENCOUNTER — Ambulatory Visit: Payer: Medicare Other | Admitting: Cardiology

## 2014-11-02 ENCOUNTER — Emergency Department (HOSPITAL_COMMUNITY): Payer: Medicare Other

## 2014-11-02 ENCOUNTER — Emergency Department (HOSPITAL_COMMUNITY)
Admission: EM | Admit: 2014-11-02 | Discharge: 2014-11-03 | Disposition: A | Discharge status: Home / Self Care | Payer: Medicare Other | Attending: Emergency Medicine | Admitting: Emergency Medicine

## 2014-11-02 ENCOUNTER — Encounter (HOSPITAL_COMMUNITY): Payer: Self-pay | Admitting: Emergency Medicine

## 2014-11-02 DIAGNOSIS — N390 Urinary tract infection, site not specified: Secondary | ICD-10-CM | POA: Diagnosis not present

## 2014-11-02 DIAGNOSIS — Z86718 Personal history of other venous thrombosis and embolism: Secondary | ICD-10-CM | POA: Insufficient documentation

## 2014-11-02 DIAGNOSIS — Z8719 Personal history of other diseases of the digestive system: Secondary | ICD-10-CM | POA: Insufficient documentation

## 2014-11-02 DIAGNOSIS — M10071 Idiopathic gout, right ankle and foot: Secondary | ICD-10-CM | POA: Insufficient documentation

## 2014-11-02 DIAGNOSIS — E785 Hyperlipidemia, unspecified: Secondary | ICD-10-CM | POA: Diagnosis not present

## 2014-11-02 DIAGNOSIS — M109 Gout, unspecified: Secondary | ICD-10-CM

## 2014-11-02 DIAGNOSIS — E119 Type 2 diabetes mellitus without complications: Secondary | ICD-10-CM | POA: Insufficient documentation

## 2014-11-02 DIAGNOSIS — I509 Heart failure, unspecified: Secondary | ICD-10-CM

## 2014-11-02 DIAGNOSIS — Z9889 Other specified postprocedural states: Secondary | ICD-10-CM | POA: Insufficient documentation

## 2014-11-02 DIAGNOSIS — Z9049 Acquired absence of other specified parts of digestive tract: Secondary | ICD-10-CM | POA: Diagnosis not present

## 2014-11-02 DIAGNOSIS — I10 Essential (primary) hypertension: Secondary | ICD-10-CM | POA: Diagnosis not present

## 2014-11-02 DIAGNOSIS — R0602 Shortness of breath: Secondary | ICD-10-CM

## 2014-11-02 DIAGNOSIS — Z862 Personal history of diseases of the blood and blood-forming organs and certain disorders involving the immune mechanism: Secondary | ICD-10-CM | POA: Diagnosis not present

## 2014-11-02 DIAGNOSIS — Z7982 Long term (current) use of aspirin: Secondary | ICD-10-CM | POA: Insufficient documentation

## 2014-11-02 DIAGNOSIS — I2510 Atherosclerotic heart disease of native coronary artery without angina pectoris: Secondary | ICD-10-CM | POA: Diagnosis not present

## 2014-11-02 DIAGNOSIS — Z8619 Personal history of other infectious and parasitic diseases: Secondary | ICD-10-CM | POA: Insufficient documentation

## 2014-11-02 DIAGNOSIS — I5032 Chronic diastolic (congestive) heart failure: Secondary | ICD-10-CM | POA: Insufficient documentation

## 2014-11-02 DIAGNOSIS — G629 Polyneuropathy, unspecified: Secondary | ICD-10-CM | POA: Insufficient documentation

## 2014-11-02 DIAGNOSIS — Z87448 Personal history of other diseases of urinary system: Secondary | ICD-10-CM | POA: Insufficient documentation

## 2014-11-02 DIAGNOSIS — Z79899 Other long term (current) drug therapy: Secondary | ICD-10-CM | POA: Insufficient documentation

## 2014-11-02 DIAGNOSIS — R109 Unspecified abdominal pain: Secondary | ICD-10-CM | POA: Diagnosis present

## 2014-11-02 DIAGNOSIS — R197 Diarrhea, unspecified: Secondary | ICD-10-CM | POA: Diagnosis not present

## 2014-11-02 DIAGNOSIS — R0789 Other chest pain: Secondary | ICD-10-CM | POA: Diagnosis not present

## 2014-11-02 LAB — CBC WITH DIFFERENTIAL/PLATELET
BASOS ABS: 0 10*3/uL (ref 0.0–0.1)
Basophils Relative: 0 % (ref 0–1)
Eosinophils Absolute: 0.1 10*3/uL (ref 0.0–0.7)
Eosinophils Relative: 2 % (ref 0–5)
HCT: 36.2 % (ref 36.0–46.0)
HEMOGLOBIN: 10.9 g/dL — AB (ref 12.0–15.0)
LYMPHS PCT: 24 % (ref 12–46)
Lymphs Abs: 1.4 10*3/uL (ref 0.7–4.0)
MCH: 21.9 pg — AB (ref 26.0–34.0)
MCHC: 30.1 g/dL (ref 30.0–36.0)
MCV: 72.8 fL — ABNORMAL LOW (ref 78.0–100.0)
MONOS PCT: 7 % (ref 3–12)
Monocytes Absolute: 0.4 10*3/uL (ref 0.1–1.0)
NEUTROS PCT: 67 % (ref 43–77)
Neutro Abs: 4.1 10*3/uL (ref 1.7–7.7)
PLATELETS: 285 10*3/uL (ref 150–400)
RBC: 4.97 MIL/uL (ref 3.87–5.11)
RDW: 13.7 % (ref 11.5–15.5)
WBC: 6 10*3/uL (ref 4.0–10.5)

## 2014-11-02 LAB — URINALYSIS, ROUTINE W REFLEX MICROSCOPIC
BILIRUBIN URINE: NEGATIVE
Glucose, UA: NEGATIVE mg/dL
Ketones, ur: NEGATIVE mg/dL
Nitrite: NEGATIVE
Protein, ur: NEGATIVE mg/dL
SPECIFIC GRAVITY, URINE: 1.019 (ref 1.005–1.030)
Urobilinogen, UA: 0.2 mg/dL (ref 0.0–1.0)
pH: 5.5 (ref 5.0–8.0)

## 2014-11-02 LAB — COMPREHENSIVE METABOLIC PANEL
ALT: 18 U/L (ref 0–35)
ANION GAP: 8 (ref 5–15)
AST: 21 U/L (ref 0–37)
Albumin: 3.8 g/dL (ref 3.5–5.2)
Alkaline Phosphatase: 89 U/L (ref 39–117)
BUN: 22 mg/dL (ref 6–23)
CALCIUM: 9.6 mg/dL (ref 8.4–10.5)
CHLORIDE: 110 mmol/L (ref 96–112)
CO2: 20 mmol/L (ref 19–32)
Creatinine, Ser: 1.34 mg/dL — ABNORMAL HIGH (ref 0.50–1.10)
GFR calc Af Amer: 44 mL/min — ABNORMAL LOW (ref >90)
GFR calc non Af Amer: 38 mL/min — ABNORMAL LOW (ref >90)
Glucose, Bld: 83 mg/dL (ref 70–99)
POTASSIUM: 4 mmol/L (ref 3.5–5.1)
Sodium: 138 mmol/L (ref 135–145)
TOTAL PROTEIN: 8.7 g/dL — AB (ref 6.0–8.3)
Total Bilirubin: 0.3 mg/dL (ref 0.3–1.2)

## 2014-11-02 LAB — URINE MICROSCOPIC-ADD ON

## 2014-11-02 LAB — I-STAT TROPONIN, ED: Troponin i, poc: 0.01 ng/mL (ref 0.00–0.08)

## 2014-11-02 LAB — LIPASE, BLOOD: LIPASE: 30 U/L (ref 11–59)

## 2014-11-02 MED ORDER — HYDROCODONE-ACETAMINOPHEN 5-325 MG PO TABS
1.0000 | ORAL_TABLET | Freq: Once | ORAL | Status: AC
  Administered 2014-11-02: 1 via ORAL
  Filled 2014-11-02: qty 1

## 2014-11-02 MED ORDER — FENTANYL CITRATE 0.05 MG/ML IJ SOLN
100.0000 ug | Freq: Once | INTRAMUSCULAR | Status: DC
  Filled 2014-11-02: qty 2

## 2014-11-02 NOTE — ED Notes (Addendum)
Patient transported to radiology Patient in NAD Will obtain EKG when patient back in room

## 2014-11-02 NOTE — ED Notes (Signed)
IV team at bedside 

## 2014-11-02 NOTE — ED Notes (Signed)
Unable to obtain PIV access or obtain labs due to poor access Patient stated, "When I need an IV, they put it in my neck." ED Charge nurse made aware IV RN consult to be placed Will inform MD of delay in labs and medication

## 2014-11-02 NOTE — ED Notes (Signed)
While assisting patient from wheelchair back in bed, patient began to climb over side of wheelchair Patient's daughters (2) repeatedly asked patient to reposition herself so that moving from wheelchair to bed would be less difficult--patient continuously declined Patient's daughters assisted patient from wheelchair back into bed Patient's daughter Cecille Rubin) noticeably frustrated and stated, "I need to leave." This nurse asked patient and pt's daughters what could be done by nursing staff to improve care Patient's daughter Cecille Rubin) then began shaking her head and proceeded to leave patient room Patient and pt's other daughter stated, "She gets like that whenever a family member gets sick." Patient stated that her daughter Cecille Rubin) is a CNA and "always gets like this with people who aren't her kin whenever I get sick." Patient and patient's other daughter informed this nurse, "It's not you. She gets like this all the time." This nurse asked patient and her daughter if there was anything that they needed--both denied complaints or needs

## 2014-11-02 NOTE — ED Notes (Signed)
IV team nurse asked to speak with this nurse r/t conversation with patient's daughter  Per IV team RN, patient's daughter stated that patient did not need an IV and that she was going home PA present during this conversation  PA informed this nurse and ED Charge that she would clarify plan of care to daughter again Lab called and made aware of need to obtain BNP

## 2014-11-02 NOTE — ED Provider Notes (Signed)
CSN: [id number]     Arrival date & time 11/02/14  2043 History   First MD Initiated Contact with Patient 11/02/14 2119     Chief Complaint  Patient presents with  . Diarrhea  . Foot Pain  . Abdominal Pain   HPI  Patient is a 74 year old female with past medical history of hypertension, hyperlipidemia, CHF, GERD, peripheral neuropathy, and gout who presents emergency room for multiple complaints. Patient states that yesterday she started having some right foot pain. She states that that has been hot red and warm. She states her pain is so bad that she cannot stand on her foot anymore. Her sheets and socks hurt her foot. She feels like this is consistent with gout. She started taking colchicine approximately week ago as she had pain in the left foot. She is now out of her medication. She is requesting pain medication for her foot. Patient is also complaining of shortness of breath. Patient has a history of CHF. She has not been coughing. Patient has been wearing her CPAP at night and also during the day because her breathing has been so bad. Patient has not been taking her heart failure medications for the past week. Patient also complaining of one week of diarrhea and abdominal pain. Abdominal pain is 2 per pubic per her report. She is having multiple episodes of diarrhea per day. She has not recently been on antibiotics that she is aware of. Since starting the colchicine her diarrhea has gotten worse.  Past Medical History  Diagnosis Date  . Hypertension   . Renal insufficiency     baseline Cr ~ 1.3  . Hyperlipidemia   . History of DVT (deep vein thrombosis)   . History of colonic polyps   . Gout   . Polyarthritis   . Microcytic anemia   . OSA (obstructive sleep apnea)   . GERD (gastroesophageal reflux disease)   . Esophagitis   . Hidradenitis suppurativa     s/p axillary sweat gland removal  . H/O blood transfusion reaction     at [facility] hospital  . Headache(784.0)   . Diverticulitis     . Difficulty sleeping   . PMB (postmenopausal bleeding)     X 2 YRS  . Bowel trouble     OCCASIONAL BOWEL INCONTINENCE  . Diabetes mellitus without complication     diet controlled  . Fibroids     s/p TAH/BSO 07/2012  . CAD (coronary artery disease)     a. 8/07 had BMS to OM.  b. In-stent restenosis with later Promus DES to same site.  c. Lexiscan myoview in 1/13 showed EF 67%, no ischemia or infarction.  d. Echo (2/13) showed EF 55-60%, moderate LVH, mild MR.  e. Lex MV 6/14: no isch, EF 53%;  f. Echo 6/14: mild LVH, EF 55-60%, Gr 1 DD, MAC, mild MR, mild LAE  . Peripheral neuropathy   . Chronic diastolic CHF (congestive heart failure) 06/29/2014   Past Surgical History  Procedure Laterality Date  . Cervical fusion c-4-5 other    . Cervical laminectomy other    . Tonsillectomy other    . Umbilical hernia repair    . Axillary seat gland removal other    . Right knee arthroscopy other    . Colectomy  2004    left partial for perforation  . Cholecystectomy    . Tubal ligation    . Ptca and stenting      of mid and distal circumflex coronary  artery. 1st stent was 04/2006, with drug eluting stent placed on 12/20/07 (30-50% RCA stenosis). Cardiologist Dr. Einar Gip.  . Dilation and curettage of uterus  02/2011  . Hysteroscopy w/d&c  05/04/2012    Procedure: DILATATION AND CURETTAGE /HYSTEROSCOPY;  Surgeon: Lahoma Crocker, MD;  Location: [facility];  Service: Gynecology;  Laterality: N/A;  . Robotic assisted total hysterectomy with bilateral salpingo oopherectomy  07/24/2012    Procedure: ROBOTIC ASSISTED TOTAL HYSTERECTOMY WITH BILATERAL SALPINGO OOPHORECTOMY;  Surgeon: Janie Morning, MD PHD;  Location: [facility];  Service: Gynecology;  Laterality: Bilateral;  attempted ,  converted to abdomnial hysterectomy  . Abdominal hysterectomy  07/24/2012    Procedure: HYSTERECTOMY ABDOMINAL;  Surgeon: Janie Morning, MD PHD;  Location: [facility];  Service: Gynecology;;  . Salpingoophorectomy  07/24/2012     Procedure: SALPINGO OOPHORECTOMY;  Surgeon: Janie Morning, MD PHD;  Location: [facility];  Service: Gynecology;  Laterality: Bilateral;  . Abdominal wound dehiscence  08/01/2012    Procedure: ABDOMINAL WOUND DEHISCENCE;  Surgeon: Lahoma Crocker, MD;  Location: [facility];  Service: Gynecology;  Laterality: N/A;  . Laparotomy  08/02/2012    Procedure: EXPLORATORY LAPAROTOMY;  Surgeon: Imogene Burn. Georgette Dover, MD;  Location: [facility];  Service: General;  Laterality: N/A;  placement of abdominal wound vac   Family History  Problem Relation Age of Onset  . Diabetes Mother   . Coronary artery disease Mother   . Diabetes Sister   . Coronary artery disease Sister    History  Substance Use Topics  . Smoking status: Never Smoker   . Smokeless tobacco: Never Used  . Alcohol Use: No   OB History    No data available     Review of Systems  Constitutional: Negative for fever, chills and fatigue.  Respiratory: Positive for chest tightness and shortness of breath. Negative for cough and wheezing.   Cardiovascular: Positive for leg swelling. Negative for chest pain and palpitations.  Gastrointestinal: Positive for abdominal pain and diarrhea. Negative for nausea, vomiting and constipation.  Genitourinary: Negative for dysuria, urgency, frequency and hematuria.  Musculoskeletal: Positive for joint swelling and arthralgias.  All other systems reviewed and are negative.     Allergies  Review of patient's allergies indicates no known allergies.  Home Medications   Prior to Admission medications   Medication Sig Start Date End Date Taking? Authorizing Provider  amLODipine (NORVASC) 10 MG tablet Take 1 tablet (10 mg total) by mouth daily. 05/29/14  Yes Janith Lima, MD  aspirin EC 81 MG tablet 1 tablet every other day 02/26/13  Yes Larey Dresser, MD  atorvastatin (LIPITOR) 20 MG tablet Take 1 tablet (20 mg total) by mouth daily. 05/29/14  Yes Janith Lima, MD  colchicine 0.6 MG tablet Take 0.6 mg by  mouth daily.   Yes Historical Provider, MD  olmesartan-hydrochlorothiazide (BENICAR HCT) 20-12.5 MG per tablet Take 1 tablet by mouth every morning. 05/29/14  Yes Janith Lima, MD  cephALEXin (KEFLEX) 500 MG capsule Take 1 capsule (500 mg total) by mouth 4 (four) times daily. 11/03/14   Nazario Russom A Forcucci, PA-C  HYDROcodone-acetaminophen (NORCO/VICODIN) 5-325 MG per tablet Take 2 tablets by mouth every 4 (four) hours as needed for moderate pain or severe pain. 11/03/14   Manar Smalling A Forcucci, PA-C  nitroGLYCERIN (NITROSTAT) 0.4 MG SL tablet Place 1 tablet (0.4 mg total) under the tongue every 5 (five) minutes as needed for chest pain. 02/26/13   Larey Dresser, MD  predniSONE (DELTASONE) 10 MG tablet  Take 6 tablets (60 mg total) by mouth daily. 11/03/14   Wilber Fini A Forcucci, PA-C   BP 168/73 mmHg  Pulse 70  Temp(Src) 98.1 F (36.7 C) (Oral)  Resp 17  SpO2 98% Physical Exam  Constitutional: She is oriented to person, place, and time. She appears well-developed and well-nourished. No distress.  HENT:  Head: Normocephalic and atraumatic.  Mouth/Throat: Oropharynx is clear and moist. No oropharyngeal exudate.  Eyes: Conjunctivae and EOM are normal. Pupils are equal, round, and reactive to light. No scleral icterus.  Neck: Normal range of motion. Neck supple. No JVD present. No thyromegaly present.  Cardiovascular: Normal rate, regular rhythm, normal heart sounds and intact distal pulses.  Exam reveals no gallop and no friction rub.   No murmur heard. Pulses:      Dorsalis pedis pulses are 2+ on the right side, and 2+ on the left side.       Posterior tibial pulses are 2+ on the right side, and 2+ on the left side.  Pulmonary/Chest: Effort normal. No respiratory distress. She has no wheezes. She has rales. She exhibits no tenderness.  Abdominal: Soft. Normal appearance and bowel sounds are normal. She exhibits no distension and no mass. There is generalized tenderness. There is no rigidity, no  rebound, no guarding, no CVA tenderness, no tenderness at McBurney's point and negative Murphy's sign.  Minimal tenderness palpation  Musculoskeletal:       Right ankle: She exhibits decreased range of motion and swelling. She exhibits no ecchymosis, no deformity, no laceration and normal pulse.       Feet:  Lymphadenopathy:    She has no cervical adenopathy.  Neurological: She is alert and oriented to person, place, and time. She has normal strength. No cranial nerve deficit or sensory deficit. Coordination normal.  Skin: Skin is warm and dry. She is not diaphoretic.  Psychiatric: She has a normal mood and affect. Her behavior is normal. Judgment and thought content normal.  Nursing note and vitals reviewed.   ED Course  Procedures (including critical care time) Labs Review Labs Reviewed  CBC WITH DIFFERENTIAL/PLATELET - Abnormal; Notable for the following:    Hemoglobin 10.9 (*)    MCV 72.8 (*)    MCH 21.9 (*)    All other components within normal limits  COMPREHENSIVE METABOLIC PANEL - Abnormal; Notable for the following:    Creatinine, Ser 1.34 (*)    Total Protein 8.7 (*)    GFR calc non Af Amer 38 (*)    GFR calc Af Amer 44 (*)    All other components within normal limits  URINALYSIS, ROUTINE W REFLEX MICROSCOPIC - Abnormal; Notable for the following:    APPearance CLOUDY (*)    Hgb urine dipstick TRACE (*)    Leukocytes, UA MODERATE (*)    All other components within normal limits  BRAIN NATRIURETIC PEPTIDE - Abnormal; Notable for the following:    B Natriuretic Peptide 109.2 (*)    All other components within normal limits  URINE MICROSCOPIC-ADD ON - Abnormal; Notable for the following:    Squamous Epithelial / LPF FEW (*)    Bacteria, UA FEW (*)    All other components within normal limits  LIPASE, BLOOD  I-STAT TROPOININ, ED    Imaging Review Dg Chest 2 View  11/02/2014   CLINICAL DATA:  Shortness of breath, diarrhea, generalized abdominal pain, weight gain,  and right foot pain and swelling for 3 weeks.  EXAM: CHEST  2 VIEW  COMPARISON:  08/04/2012  FINDINGS: Shallow inspiration. Normal heart size and pulmonary vascularity. No focal airspace disease or consolidation in the lungs. No blunting of costophrenic angles. No pneumothorax. Mediastinal contours appear intact. Calcification of the aorta. Degenerative changes throughout the spine.  IMPRESSION: No active cardiopulmonary disease.   Electronically Signed   By: Lucienne Capers M.D.   On: 11/02/2014 22:20   Dg Foot Complete Right  11/02/2014   CLINICAL DATA:  Shortness of breath, diarrhea, generalized abdominal pain, weight gain, right foot pain and swelling for about 3 weeks.  EXAM: RIGHT FOOT COMPLETE - 3+ VIEW  COMPARISON:  02/15/2012  FINDINGS: Mild diffuse bone demineralization. Degenerative changes in the interphalangeal joints, first metatarsal-phalangeal joint, and intertarsal joints. Mild progression of degenerative changes since previous study. Plantar and Achilles calcaneal spurs. 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 Vascular calcifications. No evidence of acute fracture or dislocation.  IMPRESSION: No acute bony abnormalities. Degenerative changes. Vascular calcifications.   Electronically Signed   By: Lucienne Capers M.D.   On: 11/02/2014 22:20     EKG Interpretation None      MDM   Final diagnoses:  Shortness of breath  Acute gout of right foot, unspecified cause  Congestive heart failure, unspecified congestive heart failure chronicity, unspecified congestive heart failure type  UTI (lower urinary tract infection)   Patient is a 74 year old female with multiple complaints. Complaint #1 is right foot pain. On physical exam patient has a neurovascularly intact right foot. It is mildly warm with full range of motion. It is tender to the touch. Suspect this is likely gout as history and exam is very consistent with it. We'll treat With prednisone and will  give a short course of pain medication. Complaint #2 is shortness of breath. Patient has a history of CHF and has not been taking her medications for the past week. Chest x-ray is negative for effusions or consolidations. I-STAT troponins negative. BNP is negative.  Patient also complaining of diarrhea. CBC and CMP and lipase are unremarkable. Kidney function is improving from prior. UA does show UTI. We'll start on Keflex. We'll discontinue colchicine as he believes this is contributing to her diarrhea. Does have refills for her medication and her daughter will pick them up tomorrow.  Patient seen by and discussed with Dr. Mingo Amber who agrees the patient needs to restart her medications and the plan for the other complaints.  Patient is stable for discharge at this time.  Patient to return for worsening shortness of breath, chest pain, fever, septic joint symptoms, or any other concerning symptoms. Patient and her daughter state understanding and agreement at this time. Patient to follow-up with her PCP.    Cherylann Parr, PA-C 11/03/14 0059  Evelina Bucy, MD 11/05/14 2308

## 2014-11-02 NOTE — ED Notes (Signed)
EDP and ED Charge nurse aware of previously documented issues with obtaining PIV site, labs and issue with patient's daughter

## 2014-11-02 NOTE — ED Notes (Signed)
Patient back from radiology Delay in collecting BNP--patient states that she needs to use bathroom first Patient assisted to bathroom via wheelchair Will place PIV site, collect labs and medicate patient when patient back in room

## 2014-11-02 NOTE — ED Notes (Signed)
Pt c/o diarrhea, generalized abdominal pain, weight gain and R foot pain x "about 3 weeks."  Pt ambulatory with pain. Pt family is concerned she has UTI but pt. Denies urinary symptoms. A&Ox4.

## 2014-11-02 NOTE — ED Notes (Signed)
No active vomiting upon arrival Patient in NAD

## 2014-11-02 NOTE — ED Notes (Signed)
PA at bedside.

## 2014-11-03 ENCOUNTER — Ambulatory Visit (INDEPENDENT_AMBULATORY_CARE_PROVIDER_SITE_OTHER): Payer: Medicare Other | Admitting: Family

## 2014-11-03 ENCOUNTER — Encounter: Payer: Self-pay | Admitting: Family

## 2014-11-03 VITALS — BP 150/98 | HR 70 | Temp 98.3°F | Resp 18

## 2014-11-03 DIAGNOSIS — N390 Urinary tract infection, site not specified: Secondary | ICD-10-CM

## 2014-11-03 DIAGNOSIS — M109 Gout, unspecified: Secondary | ICD-10-CM

## 2014-11-03 DIAGNOSIS — R8281 Pyuria: Secondary | ICD-10-CM | POA: Insufficient documentation

## 2014-11-03 DIAGNOSIS — M1009 Idiopathic gout, multiple sites: Secondary | ICD-10-CM

## 2014-11-03 LAB — BRAIN NATRIURETIC PEPTIDE: B Natriuretic Peptide: 109.2 pg/mL — ABNORMAL HIGH (ref 0.0–100.0)

## 2014-11-03 MED ORDER — PREDNISONE 10 MG PO TABS: 60.0000 mg | ORAL_TABLET | Freq: Every day | ORAL | Status: DC

## 2014-11-03 MED ORDER — COLCHICINE 0.6 MG PO TABS: 0.6000 mg | ORAL_TABLET | Freq: Every day | ORAL | Status: DC | PRN

## 2014-11-03 MED ORDER — INDOMETHACIN 50 MG PO CAPS: 50.0000 mg | ORAL_CAPSULE | Freq: Three times a day (TID) | ORAL | Status: DC | PRN

## 2014-11-03 MED ORDER — HYDROCODONE-ACETAMINOPHEN 5-325 MG PO TABS: 2.0000 | ORAL_TABLET | ORAL | Status: DC | PRN

## 2014-11-03 MED ORDER — CEPHALEXIN 500 MG PO CAPS: 500.0000 mg | ORAL_CAPSULE | Freq: Four times a day (QID) | ORAL | Status: DC

## 2014-11-03 NOTE — ED Notes (Signed)
Patient's family back in ED to pick up patient

## 2014-11-03 NOTE — Discharge Instructions (Signed)
Urinary Tract Infection Urinary tract infections (UTIs) can develop anywhere along your urinary tract. Your urinary tract is your body's drainage system for removing wastes and extra water. Your urinary tract includes two kidneys, two ureters, a bladder, and a urethra. Your kidneys are a pair of bean-shaped organs. Each kidney is about the size of your fist. They are located below your ribs, one on each side of your spine. CAUSES Infections are caused by microbes, which are microscopic organisms, including fungi, viruses, and bacteria. These organisms are so small that they can only be seen through a microscope. Bacteria are the microbes that most commonly cause UTIs. SYMPTOMS  Symptoms of UTIs may vary by age and gender of the patient and by the location of the infection. Symptoms in young women typically include a frequent and intense urge to urinate and a painful, burning feeling in the bladder or urethra during urination. Older women and men are more likely to be tired, shaky, and weak and have muscle aches and abdominal pain. A fever may mean the infection is in your kidneys. Other symptoms of a kidney infection include pain in your back or sides below the ribs, nausea, and vomiting. DIAGNOSIS To diagnose a UTI, your caregiver will ask you about your symptoms. Your caregiver also will ask to provide a urine sample. The urine sample will be tested for bacteria and white blood cells. White blood cells are made by your body to help fight infection. TREATMENT  Typically, UTIs can be treated with medication. Because most UTIs are caused by a bacterial infection, they usually can be treated with the use of antibiotics. The choice of antibiotic and length of treatment depend on your symptoms and the type of bacteria causing your infection. HOME CARE INSTRUCTIONS  If you were prescribed antibiotics, take them exactly as your caregiver instructs you. Finish the medication even if you feel better after you  have only taken some of the medication.  Drink enough water and fluids to keep your urine clear or pale yellow.  Avoid caffeine, tea, and carbonated beverages. They tend to irritate your bladder.  Empty your bladder often. Avoid holding urine for long periods of time.  Empty your bladder before and after sexual intercourse.  After a bowel movement, women should cleanse from front to back. Use each tissue only once. SEEK MEDICAL CARE IF:   You have back pain.  You develop a fever.  Your symptoms do not begin to resolve within 3 days. SEEK IMMEDIATE MEDICAL CARE IF:   You have severe back pain or lower abdominal pain.  You develop chills.  You have nausea or vomiting.  You have continued burning or discomfort with urination. MAKE SURE YOU:   Understand these instructions.  Will watch your condition.  Will get help right away if you are not doing well or get worse. Document Released: 06/08/2005 Document Revised: 02/28/2012 Document Reviewed: 10/07/2011 [facility] Patient Information 2015 [place], [place]. This information is not intended to replace advice given to you by your health care provider. Make sure you discuss any questions you have with your health care provider.  Heart Failure Heart failure is a condition in which the heart has trouble pumping blood. This means your heart does not pump blood efficiently for your body to work well. In some cases of heart failure, fluid may back up into your lungs or you may have swelling (edema) in your lower legs. Heart failure is usually a long-term (chronic) condition. It is important for you  to take good care of yourself and follow your health care provider's treatment plan. CAUSES  Some health conditions can cause heart failure. Those health conditions include:  High blood pressure (hypertension). Hypertension causes the heart muscle to work harder than normal. When pressure in the blood vessels is high, the heart needs to pump  (contract) with more force in order to circulate blood throughout the body. High blood pressure eventually causes the heart to become stiff and weak.  Coronary artery disease (CAD). CAD is the buildup of cholesterol and fat (plaque) in the arteries of the heart. The blockage in the arteries deprives the heart muscle of oxygen and blood. This can cause chest pain and may lead to a heart attack. High blood pressure can also contribute to CAD.  Heart attack (myocardial infarction). A heart attack occurs when one or more arteries in the heart become blocked. The loss of oxygen damages the muscle tissue of the heart. When this happens, part of the heart muscle dies. The injured tissue does not contract as well and weakens the heart's ability to pump blood.  Abnormal heart valves. When the heart valves do not open and close properly, it can cause heart failure. This makes the heart muscle pump harder to keep the blood flowing.  Heart muscle disease (cardiomyopathy or myocarditis). Heart muscle disease is damage to the heart muscle from a variety of causes. These can include drug or alcohol abuse, infections, or unknown reasons. These can increase the risk of heart failure.  Lung disease. Lung disease makes the heart work harder because the lungs do not work properly. This can cause a strain on the heart, leading it to fail.  Diabetes. Diabetes increases the risk of heart failure. High blood sugar contributes to high fat (lipid) levels in the blood. Diabetes can also cause slow damage to tiny blood vessels that carry important nutrients to the heart muscle. When the heart does not get enough oxygen and food, it can cause the heart to become weak and stiff. This leads to a heart that does not contract efficiently.  Other conditions can contribute to heart failure. These include abnormal heart rhythms, thyroid problems, and low blood counts (anemia). Certain unhealthy behaviors can increase the risk of heart  failure, including:  Being overweight.  Smoking or chewing tobacco.  Eating foods high in fat and cholesterol.  Abusing illicit drugs or alcohol.  Lacking physical activity. SYMPTOMS  Heart failure symptoms may vary and can be hard to detect. Symptoms may include:  Shortness of breath with activity, such as climbing stairs.  Persistent cough.  Swelling of the feet, ankles, legs, or abdomen.  Unexplained weight gain.  Difficulty breathing when lying flat (orthopnea).  Waking from sleep because of the need to sit up and get more air.  Rapid heartbeat.  Fatigue and loss of energy.  Feeling light-headed, dizzy, or close to fainting.  Loss of appetite.  Nausea.  Increased urination during the night (nocturia). DIAGNOSIS  A diagnosis of heart failure is based on your history, symptoms, physical examination, and diagnostic tests. Diagnostic tests for heart failure may include:  Echocardiography.  Electrocardiography.  Chest X-ray.  Blood tests.  Exercise stress test.  Cardiac angiography.  Radionuclide scans. TREATMENT  Treatment is aimed at managing the symptoms of heart failure. Medicines, behavioral changes, or surgical intervention may be necessary to treat heart failure.  Medicines to help treat heart failure may include:  Angiotensin-converting enzyme (ACE) inhibitors. This type of medicine blocks the effects  of a blood protein called angiotensin-converting enzyme. ACE inhibitors relax (dilate) the blood vessels and help lower blood pressure.  Angiotensin receptor blockers (ARBs). This type of medicine blocks the actions of a blood protein called angiotensin. Angiotensin receptor blockers dilate the blood vessels and help lower blood pressure.  Water pills (diuretics). Diuretics cause the kidneys to remove salt and water from the blood. The extra fluid is removed through urination. This loss of extra fluid lowers the volume of blood the heart  pumps.  Beta blockers. These prevent the heart from beating too fast and improve heart muscle strength.  Digitalis. This increases the force of the heartbeat.  Healthy behavior changes include:  Obtaining and maintaining a healthy weight.  Stopping smoking or chewing tobacco.  Eating heart-healthy foods.  Limiting or avoiding alcohol.  Stopping illicit drug use.  Physical activity as directed by your health care provider.  Surgical treatment for heart failure may include:  A procedure to open blocked arteries, repair damaged heart valves, or remove damaged heart muscle tissue.  A pacemaker to improve heart muscle function and control certain abnormal heart rhythms.  An internal cardioverter defibrillator to treat certain serious abnormal heart rhythms.  A left ventricular assist device (LVAD) to assist the pumping ability of the heart. HOME CARE INSTRUCTIONS   Take medicines only as directed by your health care provider. Medicines are important in reducing the workload of your heart, slowing the progression of heart failure, and improving your symptoms.  Do not stop taking your medicine unless directed by your health care provider.  Do not skip any dose of medicine.  Refill your prescriptions before you run out of medicine. Your medicines are needed every day.  Engage in moderate physical activity if directed by your health care provider. Moderate physical activity can benefit some people. The elderly and people with severe heart failure should consult with a health care provider for physical activity recommendations.  Eat heart-healthy foods. Food choices should be free of trans fat and low in saturated fat, cholesterol, and salt (sodium). Healthy choices include fresh or frozen fruits and vegetables, fish, lean meats, legumes, fat-free or low-fat dairy products, and whole grain or high fiber foods. Talk to a dietitian to learn more about heart-healthy foods.  Limit sodium  if directed by your health care provider. Sodium restriction may reduce symptoms of heart failure in some people. Talk to a dietitian to learn more about heart-healthy seasonings.  Use healthy cooking methods. Healthy cooking methods include roasting, grilling, broiling, baking, poaching, steaming, or stir-frying. Talk to a dietitian to learn more about healthy cooking methods.  Limit fluids if directed by your health care provider. Fluid restriction may reduce symptoms of heart failure in some people.  Weigh yourself every day. Daily weights are important in the early recognition of excess fluid. You should weigh yourself every morning after you urinate and before you eat breakfast. Wear the same amount of clothing each time you weigh yourself. Record your daily weight. Provide your health care provider with your weight record.  Monitor and record your blood pressure if directed by your health care provider.  Check your pulse if directed by your health care provider.  Lose weight if directed by your health care provider. Weight loss may reduce symptoms of heart failure in some people.  Stop smoking or chewing tobacco. Nicotine makes your heart work harder by causing your blood vessels to constrict. Do not use nicotine gum or patches before talking to your health care  provider.  Keep all follow-up visits as directed by your health care provider. This is important.  Limit alcohol intake to no more than 1 drink per day for nonpregnant women and 2 drinks per day for men. One drink equals 12 ounces of beer, 5 ounces of wine, or 1 ounces of hard liquor. Drinking more than that is harmful to your heart. Tell your health care provider if you drink alcohol several times a week. Talk with your health care provider about whether alcohol is safe for you. If your heart has already been damaged by alcohol or you have severe heart failure, drinking alcohol should be stopped completely.  Stop illicit drug  use.  Stay up-to-date with immunizations. It is especially important to prevent respiratory infections through current pneumococcal and influenza immunizations.  Manage other health conditions such as hypertension, diabetes, thyroid disease, or abnormal heart rhythms as directed by your health care provider.  Learn to manage stress.  Plan rest periods when fatigued.  Learn strategies to manage high temperatures. If the weather is extremely hot:  Avoid vigorous physical activity.  Use air conditioning or fans or seek a cooler location.  Avoid caffeine and alcohol.  Wear loose-fitting, lightweight, and light-colored clothing.  Learn strategies to manage cold temperatures. If the weather is extremely cold:  Avoid vigorous physical activity.  Layer clothes.  Wear mittens or gloves, a hat, and a scarf when going outside.  Avoid alcohol.  Obtain ongoing education and support as needed.  Participate in or seek rehabilitation as needed to maintain or improve independence and quality of life. SEEK MEDICAL CARE IF:   Your weight increases by 03 lb/1.4 kg in 1 day or 05 lb/2.3 kg in a week.  You have increasing shortness of breath that is unusual for you.  You are unable to participate in your usual physical activities.  You tire easily.  You cough more than normal, especially with physical activity.  You have any or more swelling in areas such as your hands, feet, ankles, or abdomen.  You are unable to sleep because it is hard to breathe.  You feel like your heart is beating fast (palpitations).  You become dizzy or light-headed upon standing up. SEEK IMMEDIATE MEDICAL CARE IF:   You have difficulty breathing.  There is a change in mental status such as decreased alertness or difficulty with concentration.  You have a pain or discomfort in your chest.  You have an episode of fainting (syncope). MAKE SURE YOU:   Understand these instructions.  Will watch your  condition.  Will get help right away if you are not doing well or get worse. Document Released: 08/29/2005 Document Revised: 01/13/2014 Document Reviewed: 09/28/2012 [facility] Patient Information 2015 [place], [place]. This information is not intended to replace advice given to you by your health care provider. Make sure you discuss any questions you have with your health care provider.  Gout Gout is an inflammatory arthritis caused by a buildup of uric acid crystals in the joints. Uric acid is a chemical that is normally present in the blood. When the level of uric acid in the blood is too high it can form crystals that deposit in your joints and tissues. This causes joint redness, soreness, and swelling (inflammation). Repeat attacks are common. Over time, uric acid crystals can form into masses (tophi) near a joint, destroying bone and causing disfigurement. Gout is treatable and often preventable. CAUSES  The disease begins with elevated levels of uric acid in the  blood. Uric acid is produced by your body when it breaks down a naturally found substance called purines. Certain foods you eat, such as meats and fish, contain high amounts of purines. Causes of an elevated uric acid level include:  Being passed down from parent to child (heredity).  Diseases that cause increased uric acid production (such as obesity, psoriasis, and certain cancers).  Excessive alcohol use.  Diet, especially diets rich in meat and seafood.  Medicines, including certain cancer-fighting medicines (chemotherapy), water pills (diuretics), and aspirin.  Chronic kidney disease. The kidneys are no longer able to remove uric acid well.  Problems with metabolism. Conditions strongly associated with gout include:  Obesity.  High blood pressure.  High cholesterol.  Diabetes. Not everyone with elevated uric acid levels gets gout. It is not understood why some people get gout and others do not. Surgery, joint injury,  and eating too much of certain foods are some of the factors that can lead to gout attacks. SYMPTOMS   An attack of gout comes on quickly. It causes intense pain with redness, swelling, and warmth in a joint.  Fever can occur.  Often, only one joint is involved. Certain joints are more commonly involved:  Base of the big toe.  Knee.  Ankle.  Wrist.  Finger. Without treatment, an attack usually goes away in a few days to weeks. Between attacks, you usually will not have symptoms, which is different from many other forms of arthritis. DIAGNOSIS  Your caregiver will suspect gout based on your symptoms and exam. In some cases, tests may be recommended. The tests may include:  Blood tests.  Urine tests.  X-rays.  Joint fluid exam. This exam requires a needle to remove fluid from the joint (arthrocentesis). Using a microscope, gout is confirmed when uric acid crystals are seen in the joint fluid. TREATMENT  There are two phases to gout treatment: treating the sudden onset (acute) attack and preventing attacks (prophylaxis).  Treatment of an Acute Attack.  Medicines are used. These include anti-inflammatory medicines or steroid medicines.  An injection of steroid medicine into the affected joint is sometimes necessary.  The painful joint is rested. Movement can worsen the arthritis.  You may use warm or cold treatments on painful joints, depending which works best for you.  Treatment to Prevent Attacks.  If you suffer from frequent gout attacks, your caregiver may advise preventive medicine. These medicines are started after the acute attack subsides. These medicines either help your kidneys eliminate uric acid from your body or decrease your uric acid production. You may need to stay on these medicines for a very long time.  The early phase of treatment with preventive medicine can be associated with an increase in acute gout attacks. For this reason, during the first few  months of treatment, your caregiver may also advise you to take medicines usually used for acute gout treatment. Be sure you understand your caregiver's directions. Your caregiver may make several adjustments to your medicine dose before these medicines are effective.  Discuss dietary treatment with your caregiver or dietitian. Alcohol and drinks high in sugar and fructose and foods such as meat, poultry, and seafood can increase uric acid levels. Your caregiver or dietitian can advise you on drinks and foods that should be limited. HOME CARE INSTRUCTIONS   Do not take aspirin to relieve pain. This raises uric acid levels.  Only take over-the-counter or prescription medicines for pain, discomfort, or fever as directed by your caregiver.  Rest  the joint as much as possible. When in bed, keep sheets and blankets off painful areas.  Keep the affected joint raised (elevated).  Apply warm or cold treatments to painful joints. Use of warm or cold treatments depends on which works best for you.  Use crutches if the painful joint is in your leg.  Drink enough fluids to keep your urine clear or pale yellow. This helps your body get rid of uric acid. Limit alcohol, sugary drinks, and fructose drinks.  Follow your dietary instructions. Pay careful attention to the amount of protein you eat. Your daily diet should emphasize fruits, vegetables, whole grains, and fat-free or low-fat milk products. Discuss the use of coffee, vitamin C, and cherries with your caregiver or dietitian. These may be helpful in lowering uric acid levels.  Maintain a healthy body weight. SEEK MEDICAL CARE IF:   You develop diarrhea, vomiting, or any side effects from medicines.  You do not feel better in 24 hours, or you are getting worse. SEEK IMMEDIATE MEDICAL CARE IF:   Your joint becomes suddenly more tender, and you have chills or a fever. MAKE SURE YOU:   Understand these instructions.  Will watch your  condition.  Will get help right away if you are not doing well or get worse. Document Released: 08/26/2000 Document Revised: 01/13/2014 Document Reviewed: 04/11/2012 [facility] Patient Information 2015 [place], [place]. This information is not intended to replace advice given to you by your health care provider. Make sure you discuss any questions you have with your health care provider.

## 2014-11-03 NOTE — Progress Notes (Signed)
Subjective:    Patient ID: Marissa [name], female    DOB: 09-20-1940, 74 y.o.   MRN: [id number]  Chief Complaint  Patient presents with  . Gout    gout flare up on right foot,     HPI:  Marissa [name] is a 74 y.o. female who presents today for an ED follow-up.  Patient was seen yesterday, 11/02/14, in the emergency room with multiple complaints including gout, diarrhea, and shortness of breath. Her urinalysis was positive for pyuria. She was started on Keflex which she has yet to start. She presents today for her gout for which she was prescribed prednisone. All ED records and results were independently reviewed in detail.   Associated symptom of right foot pain has been going on for a couple of weeks. She was able to walk until yesterday. She continues to experience difficulty with walking. Patient is unable to describe pain. Pain is currently rated an 8/10 and is worsened with standing.   No Known Allergies   Current Outpatient Prescriptions on File Prior to Visit  Medication Sig Dispense Refill  . amLODipine (NORVASC) 10 MG tablet Take 1 tablet (10 mg total) by mouth daily. 30 tablet 11  . aspirin EC 81 MG tablet 1 tablet every other day    . cephALEXin (KEFLEX) 500 MG capsule Take 1 capsule (500 mg total) by mouth 4 (four) times daily. 28 capsule 0  . HYDROcodone-acetaminophen (NORCO/VICODIN) 5-325 MG per tablet Take 2 tablets by mouth every 4 (four) hours as needed for moderate pain or severe pain. 10 tablet 0  . nitroGLYCERIN (NITROSTAT) 0.4 MG SL tablet Place 1 tablet (0.4 mg total) under the tongue every 5 (five) minutes as needed for chest pain. 100 tablet 3  . olmesartan-hydrochlorothiazide (BENICAR HCT) 20-12.5 MG per tablet Take 1 tablet by mouth every morning. 30 tablet 11  . predniSONE (DELTASONE) 10 MG tablet Take 6 tablets (60 mg total) by mouth daily. 30 tablet 0   No current facility-administered medications on file prior to visit.    Past Medical History    Diagnosis Date  . Hypertension   . Renal insufficiency     baseline Cr ~ 1.3  . Hyperlipidemia   . History of DVT (deep vein thrombosis)   . History of colonic polyps   . Gout   . Polyarthritis   . Microcytic anemia   . OSA (obstructive sleep apnea)   . GERD (gastroesophageal reflux disease)   . Esophagitis   . Hidradenitis suppurativa     s/p axillary sweat gland removal  . H/O blood transfusion reaction     at [facility] hospital  . Headache(784.0)   . Diverticulitis   . Difficulty sleeping   . PMB (postmenopausal bleeding)     X 2 YRS  . Bowel trouble     OCCASIONAL BOWEL INCONTINENCE  . Diabetes mellitus without complication     diet controlled  . Fibroids     s/p TAH/BSO 07/2012  . CAD (coronary artery disease)     a. 8/07 had BMS to OM.  b. In-stent restenosis with later Promus DES to same site.  c. Lexiscan myoview in 1/13 showed EF 67%, no ischemia or infarction.  d. Echo (2/13) showed EF 55-60%, moderate LVH, mild MR.  e. Lex MV 6/14: no isch, EF 53%;  f. Echo 6/14: mild LVH, EF 55-60%, Gr 1 DD, MAC, mild MR, mild LAE  . Peripheral neuropathy   . Chronic diastolic CHF (congestive heart failure) 06/29/2014  Review of Systems  Musculoskeletal:       Positive for right foot and ankle pain  Neurological: Negative for numbness.      Objective:    BP 150/98 mmHg  Pulse 70  Temp(Src) 98.3 F (36.8 C) (Oral)  Resp 18  Ht   Wt   SpO2 97% Nursing note and vital signs reviewed.  Physical Exam  Constitutional: She is oriented to person, place, and time. She appears well-developed and well-nourished. No distress.  Cardiovascular: Normal rate, regular rhythm, normal heart sounds and intact distal pulses.   Pulmonary/Chest: Effort normal and breath sounds normal.  Musculoskeletal:  No obvious deformity or right ankle noted. There is increased edema and warmth. Tenderness elicited on the dorsal aspect of the foot and both medial and lateral ankle. Pulse is intact and  appropriate.   Neurological: She is alert and oriented to person, place, and time.  Skin: Skin is warm and dry.  Psychiatric: She has a normal mood and affect. Her behavior is normal. Judgment and thought content normal.       Assessment & Plan:

## 2014-11-03 NOTE — Patient Instructions (Signed)
Thank you for choosing [facility].  Summary/Instructions:  Your prescription(s) have been submitted to your pharmacy or been printed and provided for you. Please take as directed and contact our office if you believe you are having problem(s) with the medication(s) or have any questions.  If your symptoms worsen or fail to improve, please contact our office for further instruction, or in case of emergency go directly to the emergency room at the closest medical facility.   Gout Gout is an inflammatory arthritis caused by a buildup of uric acid crystals in the joints. Uric acid is a chemical that is normally present in the blood. When the level of uric acid in the blood is too high it can form crystals that deposit in your joints and tissues. This causes joint redness, soreness, and swelling (inflammation). Repeat attacks are common. Over time, uric acid crystals can form into masses (tophi) near a joint, destroying bone and causing disfigurement. Gout is treatable and often preventable. CAUSES  The disease begins with elevated levels of uric acid in the blood. Uric acid is produced by your body when it breaks down a naturally found substance called purines. Certain foods you eat, such as meats and fish, contain high amounts of purines. Causes of an elevated uric acid level include:  Being passed down from parent to child (heredity).  Diseases that cause increased uric acid production (such as obesity, psoriasis, and certain cancers).  Excessive alcohol use.  Diet, especially diets rich in meat and seafood.  Medicines, including certain cancer-fighting medicines (chemotherapy), water pills (diuretics), and aspirin.  Chronic kidney disease. The kidneys are no longer able to remove uric acid well.  Problems with metabolism. Conditions strongly associated with gout include:  Obesity.  High blood pressure.  High cholesterol.  Diabetes. Not everyone with elevated uric acid levels  gets gout. It is not understood why some people get gout and others do not. Surgery, joint injury, and eating too much of certain foods are some of the factors that can lead to gout attacks. SYMPTOMS   An attack of gout comes on quickly. It causes intense pain with redness, swelling, and warmth in a joint.  Fever can occur.  Often, only one joint is involved. Certain joints are more commonly involved:  Base of the big toe.  Knee.  Ankle.  Wrist.  Finger. Without treatment, an attack usually goes away in a few days to weeks. Between attacks, you usually will not have symptoms, which is different from many other forms of arthritis. DIAGNOSIS  Your caregiver will suspect gout based on your symptoms and exam. In some cases, tests may be recommended. The tests may include:  Blood tests.  Urine tests.  X-rays.  Joint fluid exam. This exam requires a needle to remove fluid from the joint (arthrocentesis). Using a microscope, gout is confirmed when uric acid crystals are seen in the joint fluid. TREATMENT  There are two phases to gout treatment: treating the sudden onset (acute) attack and preventing attacks (prophylaxis).  Treatment of an Acute Attack.  Medicines are used. These include anti-inflammatory medicines or steroid medicines.  An injection of steroid medicine into the affected joint is sometimes necessary.  The painful joint is rested. Movement can worsen the arthritis.  You may use warm or cold treatments on painful joints, depending which works best for you.  Treatment to Prevent Attacks.  If you suffer from frequent gout attacks, your caregiver may advise preventive medicine. These medicines are started after the acute  attack subsides. These medicines either help your kidneys eliminate uric acid from your body or decrease your uric acid production. You may need to stay on these medicines for a very long time.  The early phase of treatment with preventive medicine  can be associated with an increase in acute gout attacks. For this reason, during the first few months of treatment, your caregiver may also advise you to take medicines usually used for acute gout treatment. Be sure you understand your caregiver's directions. Your caregiver may make several adjustments to your medicine dose before these medicines are effective.  Discuss dietary treatment with your caregiver or dietitian. Alcohol and drinks high in sugar and fructose and foods such as meat, poultry, and seafood can increase uric acid levels. Your caregiver or dietitian can advise you on drinks and foods that should be limited. HOME CARE INSTRUCTIONS   Do not take aspirin to relieve pain. This raises uric acid levels.  Only take over-the-counter or prescription medicines for pain, discomfort, or fever as directed by your caregiver.  Rest the joint as much as possible. When in bed, keep sheets and blankets off painful areas.  Keep the affected joint raised (elevated).  Apply warm or cold treatments to painful joints. Use of warm or cold treatments depends on which works best for you.  Use crutches if the painful joint is in your leg.  Drink enough fluids to keep your urine clear or pale yellow. This helps your body get rid of uric acid. Limit alcohol, sugary drinks, and fructose drinks.  Follow your dietary instructions. Pay careful attention to the amount of protein you eat. Your daily diet should emphasize fruits, vegetables, whole grains, and fat-free or low-fat milk products. Discuss the use of coffee, vitamin C, and cherries with your caregiver or dietitian. These may be helpful in lowering uric acid levels.  Maintain a healthy body weight. SEEK MEDICAL CARE IF:   You develop diarrhea, vomiting, or any side effects from medicines.  You do not feel better in 24 hours, or you are getting worse. SEEK IMMEDIATE MEDICAL CARE IF:   Your joint becomes suddenly more tender, and you have  chills or a fever. MAKE SURE YOU:   Understand these instructions.  Will watch your condition.  Will get help right away if you are not doing well or get worse. Document Released: 08/26/2000 Document Revised: 01/13/2014 Document Reviewed: 04/11/2012 [facility] Patient Information 2015 [place], [place]. This information is not intended to replace advice given to you by your health care provider. Make sure you discuss any questions you have with your health care provider.

## 2014-11-03 NOTE — Assessment & Plan Note (Signed)
Symptoms and exam consistent with gout flareup. Refill colchicine. Start indomethacin as needed for pain. Patient advised to complete the dose of prednisone that was prescribed in the emergency department. Anticipate improvement over the next 72 hours. Follow up if symptoms worsen or fail to improve.

## 2014-11-03 NOTE — ED Notes (Addendum)
DC instructions and Rx reviewed with patient Patient stated that her daughters left the ED  Patient reports that one daughter has gone home for the night and the other has gone to pick up a family member Patient does not want to "sit out in a chair in the waiting room" Patient to stay in room until family member arrives to take home Patient in NAD Side rails up, call bell in reach ED Charge made aware

## 2014-11-03 NOTE — Progress Notes (Signed)
Pre visit review using our clinic review tool, if applicable. No additional management support is needed unless otherwise documented below in the visit note. 

## 2014-11-03 NOTE — Assessment & Plan Note (Signed)
Patient presented to ED with pyuria. Start the Keflex previously prescribed.

## 2014-11-21 ENCOUNTER — Telehealth: Payer: Self-pay | Admitting: Internal Medicine

## 2014-11-21 NOTE — Telephone Encounter (Signed)
Pt daughter called in said that pt still has the UTI.  She wants to know what she should do?      Best number [phone] [email or web address]

## 2014-11-23 NOTE — Telephone Encounter (Signed)
She needs to be seen.

## 2014-11-26 ENCOUNTER — Other Ambulatory Visit: Payer: Self-pay | Admitting: Internal Medicine

## 2014-11-26 ENCOUNTER — Ambulatory Visit (INDEPENDENT_AMBULATORY_CARE_PROVIDER_SITE_OTHER): Payer: Medicare Other | Admitting: Internal Medicine

## 2014-11-26 ENCOUNTER — Other Ambulatory Visit (INDEPENDENT_AMBULATORY_CARE_PROVIDER_SITE_OTHER): Payer: Medicare Other

## 2014-11-26 ENCOUNTER — Encounter: Payer: Self-pay | Admitting: Internal Medicine

## 2014-11-26 VITALS — BP 110/78 | HR 84 | Temp 97.5°F | Ht 64.0 in | Wt 227.2 lb

## 2014-11-26 DIAGNOSIS — R202 Paresthesia of skin: Secondary | ICD-10-CM

## 2014-11-26 DIAGNOSIS — M2550 Pain in unspecified joint: Secondary | ICD-10-CM

## 2014-11-26 DIAGNOSIS — Z8739 Personal history of other diseases of the musculoskeletal system and connective tissue: Secondary | ICD-10-CM

## 2014-11-26 DIAGNOSIS — R200 Anesthesia of skin: Secondary | ICD-10-CM

## 2014-11-26 DIAGNOSIS — Z8639 Personal history of other endocrine, nutritional and metabolic disease: Secondary | ICD-10-CM

## 2014-11-26 LAB — RHEUMATOID FACTOR

## 2014-11-26 LAB — URIC ACID: Uric Acid, Serum: 11.7 mg/dL — ABNORMAL HIGH (ref 2.4–7.0)

## 2014-11-26 LAB — BASIC METABOLIC PANEL
BUN: 41 mg/dL — ABNORMAL HIGH (ref 6–23)
CO2: 20 meq/L (ref 19–32)
CREATININE: 2.01 mg/dL — AB (ref 0.40–1.20)
Calcium: 9.6 mg/dL (ref 8.4–10.5)
Chloride: 110 mEq/L (ref 96–112)
GFR: 31.14 mL/min — ABNORMAL LOW (ref >60.00)
Glucose, Bld: 109 mg/dL — ABNORMAL HIGH (ref 70–99)
POTASSIUM: 4.4 meq/L (ref 3.5–5.1)
Sodium: 137 mEq/L (ref 135–145)

## 2014-11-26 LAB — VITAMIN B12: Vitamin B-12: 265 pg/mL (ref 211–911)

## 2014-11-26 LAB — SEDIMENTATION RATE: Sed Rate: 57 mm/hr — ABNORMAL HIGH (ref 0–22)

## 2014-11-26 LAB — MAGNESIUM: MAGNESIUM: 1.7 mg/dL (ref 1.5–2.5)

## 2014-11-26 MED ORDER — ERYTHROMYCIN 5 MG/GM OP OINT: TOPICAL_OINTMENT | OPHTHALMIC | Status: DC

## 2014-11-26 NOTE — Patient Instructions (Signed)
  Your next office appointment will be determined based upon review of your pending labs  Those instructions will be transmitted to you by mail.   Critical values will be called. Followup as needed for any active or acute issue. Please report any significant change in your symptoms. 

## 2014-11-26 NOTE — Progress Notes (Signed)
Pre visit review using our clinic review tool, if applicable. No additional management support is needed unless otherwise documented below in the visit note. 

## 2014-11-26 NOTE — Progress Notes (Signed)
   Subjective:    Patient ID: Marissa [name], female    DOB: [birth year], 74 y.o.   MRN: [id number]  HPI She comes in because of pain and swelling in her upper & lower extremities. She states that she has had the symptoms of pain and joint swelling particularly in  the feet with extension up the shins as well as of hands since January. She relates going to emergency room 11/02/14  for these symptoms and being told that she had a urinary tract infection.ER note was reviewed;other complaints included abdominal pain & diarrhea.The diarrhea had been exacerbated by colchicine taken for possible gout.  She also has some numbness and tingling in her feet and hands with weakness.   She has a past history of gout and is on colchicine as needed. She is very unsure of her present med list. In particular she was unsure whether she was taking 60 mg of prednisone a day.Subsequently she identified this as a medication Rxed by the ER  which she discontinued.  Review of the chart reveals no current uric acid level.   Review of Systems She has no associated change in color or temperature of the skin in the area of the pain.  She has sweats without fever chills. Weight is stable.     Objective:   Physical Exam Pertinent or positive findings include: There is a papule over the left upper eyelid.  She has a few remaining upper teeth;  dental hygiene is fair. Abdomen is protuberant.  She has fusiform knees with crepitus.  Pedal pulses are decreased.  Gait is slow and broad.   General appearance :adequately nourished; in no distress. Eyes: No conjunctival inflammation or scleral icterus is present. Oral exam:  Lips and gums are healthy appearing.There is no oropharyngeal erythema or exudate noted. Heart:  Normal rate and regular rhythm. S1 and S2 normal without gallop, murmur, click, rub or other extra sounds   Lungs:Chest clear to auscultation; no wheezes, rhonchi,rales ,or rubs present.No increased work of  breathing.  Abdomen: bowel sounds normal, soft and non-tender without masses, organomegaly or hernias noted.  No guarding or rebound. No flank tenderness to percussion. Vascular : all pulses equal ; no bruits present. Skin:Warm & dry.  Intact without suspicious lesions or rashes ; no tenting  Lymphatic: No lymphadenopathy is noted about the head, neck, axilla Neuro: Strength, tone & DTRs normal.         Assessment & Plan:  #1 pain and swelling in upper and lower extremities  #2 numbness and tingling   #3 history of gout  Plan: See orders and recommendations

## 2014-11-26 NOTE — Telephone Encounter (Signed)
The knot on her eye is new and just seen it on her eye last night.  She would like for hop to send some cream in for her eye.  Pt daughter would like to make sure something else is called in for her uti.

## 2014-11-28 ENCOUNTER — Ambulatory Visit: Payer: Medicare Other | Admitting: Internal Medicine

## 2014-12-20 ENCOUNTER — Inpatient Hospital Stay (HOSPITAL_COMMUNITY)
Admission: EM | Admit: 2014-12-20 | Discharge: 2014-12-25 | DRG: 330 | Disposition: A | Discharge status: Home Health | Payer: Medicare Other | Attending: Internal Medicine | Admitting: Internal Medicine

## 2014-12-20 ENCOUNTER — Encounter (HOSPITAL_COMMUNITY): Payer: Self-pay | Admitting: Emergency Medicine

## 2014-12-20 DIAGNOSIS — K922 Gastrointestinal hemorrhage, unspecified: Secondary | ICD-10-CM | POA: Diagnosis not present

## 2014-12-20 DIAGNOSIS — Z981 Arthrodesis status: Secondary | ICD-10-CM

## 2014-12-20 DIAGNOSIS — Z86718 Personal history of other venous thrombosis and embolism: Secondary | ICD-10-CM | POA: Diagnosis not present

## 2014-12-20 DIAGNOSIS — I5032 Chronic diastolic (congestive) heart failure: Secondary | ICD-10-CM | POA: Diagnosis not present

## 2014-12-20 DIAGNOSIS — E119 Type 2 diabetes mellitus without complications: Secondary | ICD-10-CM | POA: Diagnosis present

## 2014-12-20 DIAGNOSIS — G4733 Obstructive sleep apnea (adult) (pediatric): Secondary | ICD-10-CM | POA: Diagnosis present

## 2014-12-20 DIAGNOSIS — N182 Chronic kidney disease, stage 2 (mild): Secondary | ICD-10-CM | POA: Diagnosis present

## 2014-12-20 DIAGNOSIS — M10072 Idiopathic gout, left ankle and foot: Secondary | ICD-10-CM | POA: Diagnosis present

## 2014-12-20 DIAGNOSIS — I129 Hypertensive chronic kidney disease with stage 1 through stage 4 chronic kidney disease, or unspecified chronic kidney disease: Secondary | ICD-10-CM | POA: Diagnosis present

## 2014-12-20 DIAGNOSIS — N183 Chronic kidney disease, stage 3 unspecified: Secondary | ICD-10-CM | POA: Diagnosis present

## 2014-12-20 DIAGNOSIS — M109 Gout, unspecified: Secondary | ICD-10-CM | POA: Diagnosis present

## 2014-12-20 DIAGNOSIS — I10 Essential (primary) hypertension: Secondary | ICD-10-CM | POA: Diagnosis not present

## 2014-12-20 DIAGNOSIS — M79672 Pain in left foot: Secondary | ICD-10-CM

## 2014-12-20 DIAGNOSIS — N189 Chronic kidney disease, unspecified: Secondary | ICD-10-CM | POA: Diagnosis not present

## 2014-12-20 DIAGNOSIS — Z833 Family history of diabetes mellitus: Secondary | ICD-10-CM | POA: Diagnosis not present

## 2014-12-20 DIAGNOSIS — E785 Hyperlipidemia, unspecified: Secondary | ICD-10-CM | POA: Diagnosis present

## 2014-12-20 DIAGNOSIS — Z79899 Other long term (current) drug therapy: Secondary | ICD-10-CM

## 2014-12-20 DIAGNOSIS — G629 Polyneuropathy, unspecified: Secondary | ICD-10-CM | POA: Diagnosis present

## 2014-12-20 DIAGNOSIS — Z9861 Coronary angioplasty status: Secondary | ICD-10-CM

## 2014-12-20 DIAGNOSIS — N179 Acute kidney failure, unspecified: Secondary | ICD-10-CM | POA: Diagnosis present

## 2014-12-20 DIAGNOSIS — M1000 Idiopathic gout, unspecified site: Secondary | ICD-10-CM | POA: Diagnosis not present

## 2014-12-20 DIAGNOSIS — F0390 Unspecified dementia without behavioral disturbance: Secondary | ICD-10-CM | POA: Diagnosis present

## 2014-12-20 DIAGNOSIS — R072 Precordial pain: Secondary | ICD-10-CM | POA: Diagnosis not present

## 2014-12-20 DIAGNOSIS — D62 Acute posthemorrhagic anemia: Secondary | ICD-10-CM | POA: Diagnosis present

## 2014-12-20 DIAGNOSIS — Z955 Presence of coronary angioplasty implant and graft: Secondary | ICD-10-CM | POA: Diagnosis not present

## 2014-12-20 DIAGNOSIS — E6609 Other obesity due to excess calories: Secondary | ICD-10-CM | POA: Diagnosis present

## 2014-12-20 DIAGNOSIS — K219 Gastro-esophageal reflux disease without esophagitis: Secondary | ICD-10-CM | POA: Diagnosis present

## 2014-12-20 DIAGNOSIS — I2510 Atherosclerotic heart disease of native coronary artery without angina pectoris: Secondary | ICD-10-CM | POA: Diagnosis present

## 2014-12-20 DIAGNOSIS — Z8249 Family history of ischemic heart disease and other diseases of the circulatory system: Secondary | ICD-10-CM | POA: Diagnosis not present

## 2014-12-20 DIAGNOSIS — Z7952 Long term (current) use of systemic steroids: Secondary | ICD-10-CM | POA: Diagnosis not present

## 2014-12-20 DIAGNOSIS — Z6838 Body mass index (BMI) 38.0-38.9, adult: Secondary | ICD-10-CM | POA: Diagnosis not present

## 2014-12-20 DIAGNOSIS — M10071 Idiopathic gout, right ankle and foot: Secondary | ICD-10-CM | POA: Diagnosis present

## 2014-12-20 DIAGNOSIS — K5731 Diverticulosis of large intestine without perforation or abscess with bleeding: Secondary | ICD-10-CM | POA: Diagnosis not present

## 2014-12-20 DIAGNOSIS — K625 Hemorrhage of anus and rectum: Secondary | ICD-10-CM | POA: Diagnosis present

## 2014-12-20 DIAGNOSIS — D509 Iron deficiency anemia, unspecified: Secondary | ICD-10-CM | POA: Diagnosis present

## 2014-12-20 DIAGNOSIS — I472 Ventricular tachycardia: Secondary | ICD-10-CM | POA: Diagnosis present

## 2014-12-20 DIAGNOSIS — K5791 Diverticulosis of intestine, part unspecified, without perforation or abscess with bleeding: Secondary | ICD-10-CM

## 2014-12-20 LAB — CBC WITH DIFFERENTIAL/PLATELET
BASOS ABS: 0.1 10*3/uL (ref 0.0–0.1)
Basophils Relative: 1 % (ref 0–1)
EOS ABS: 0.1 10*3/uL (ref 0.0–0.7)
Eosinophils Relative: 1 % (ref 0–5)
HEMATOCRIT: 29.4 % — AB (ref 36.0–46.0)
HEMOGLOBIN: 8.9 g/dL — AB (ref 12.0–15.0)
LYMPHS ABS: 1.5 10*3/uL (ref 0.7–4.0)
Lymphocytes Relative: 17 % (ref 12–46)
MCH: 21.9 pg — ABNORMAL LOW (ref 26.0–34.0)
MCHC: 30.3 g/dL (ref 30.0–36.0)
MCV: 72.2 fL — AB (ref 78.0–100.0)
Monocytes Absolute: 0.5 10*3/uL (ref 0.1–1.0)
Monocytes Relative: 6 % (ref 3–12)
NEUTROS PCT: 75 % (ref 43–77)
Neutro Abs: 6.4 10*3/uL (ref 1.7–7.7)
PLATELETS: 301 10*3/uL (ref 150–400)
RBC: 4.07 MIL/uL (ref 3.87–5.11)
RDW: 14.1 % (ref 11.5–15.5)
WBC: 8.6 10*3/uL (ref 4.0–10.5)

## 2014-12-20 LAB — PROTIME-INR
INR: 1.04 (ref 0.00–1.49)
Prothrombin Time: 13.7 seconds (ref 11.6–15.2)

## 2014-12-20 LAB — COMPREHENSIVE METABOLIC PANEL
ALBUMIN: 3.2 g/dL — AB (ref 3.5–5.2)
ALT: 11 U/L (ref 0–35)
AST: 19 U/L (ref 0–37)
Alkaline Phosphatase: 82 U/L (ref 39–117)
Anion gap: 8 (ref 5–15)
BUN: 19 mg/dL (ref 6–23)
CALCIUM: 9.1 mg/dL (ref 8.4–10.5)
CHLORIDE: 106 mmol/L (ref 96–112)
CO2: 25 mmol/L (ref 19–32)
Creatinine, Ser: 1.55 mg/dL — ABNORMAL HIGH (ref 0.50–1.10)
GFR, EST AFRICAN AMERICAN: 37 mL/min — AB (ref >90)
GFR, EST NON AFRICAN AMERICAN: 32 mL/min — AB (ref >90)
Glucose, Bld: 122 mg/dL — ABNORMAL HIGH (ref 70–99)
POTASSIUM: 3.7 mmol/L (ref 3.5–5.1)
SODIUM: 139 mmol/L (ref 135–145)
Total Bilirubin: 0.4 mg/dL (ref 0.3–1.2)
Total Protein: 6.7 g/dL (ref 6.0–8.3)

## 2014-12-20 LAB — PREPARE RBC (CROSSMATCH)

## 2014-12-20 LAB — POC OCCULT BLOOD, ED: Fecal Occult Bld: POSITIVE — AB

## 2014-12-20 MED ORDER — SODIUM CHLORIDE 0.9 % IV SOLN
10.0000 mL/h | Freq: Once | INTRAVENOUS | Status: AC
  Administered 2014-12-20: 10 mL/h via INTRAVENOUS

## 2014-12-20 MED ORDER — FUROSEMIDE 20 MG PO TABS
20.0000 mg | ORAL_TABLET | Freq: Every day | ORAL | Status: DC | PRN
  Filled 2014-12-20: qty 1

## 2014-12-20 MED ORDER — PANTOPRAZOLE SODIUM 40 MG IV SOLR
40.0000 mg | Freq: Two times a day (BID) | INTRAVENOUS | Status: DC
  Administered 2014-12-21 – 2014-12-23 (×6): 40 mg via INTRAVENOUS
  Filled 2014-12-20 (×9): qty 40

## 2014-12-20 MED ORDER — SODIUM CHLORIDE 0.9 % IV BOLUS (SEPSIS)
1000.0000 mL | Freq: Once | INTRAVENOUS | Status: AC
Start: 2014-12-20 — End: 2014-12-20
  Administered 2014-12-20: 1000 mL via INTRAVENOUS

## 2014-12-20 MED ORDER — ONDANSETRON HCL 4 MG/2ML IJ SOLN: 4.0000 mg | Freq: Four times a day (QID) | INTRAMUSCULAR | Status: DC | PRN

## 2014-12-20 MED ORDER — ONDANSETRON HCL 4 MG PO TABS: 4.0000 mg | ORAL_TABLET | Freq: Four times a day (QID) | ORAL | Status: DC | PRN

## 2014-12-20 MED ORDER — SODIUM CHLORIDE 0.9 % IV SOLN
INTRAVENOUS | Status: DC
Start: 2014-12-20 — End: 2014-12-21

## 2014-12-20 MED ORDER — OXYCODONE HCL 5 MG PO TABS: 5.0000 mg | ORAL_TABLET | ORAL | Status: DC | PRN

## 2014-12-20 MED ORDER — SODIUM CHLORIDE 0.9 % IV SOLN
INTRAVENOUS | Status: DC
  Administered 2014-12-21 – 2014-12-23 (×3): via INTRAVENOUS

## 2014-12-20 MED ORDER — AMLODIPINE BESYLATE 10 MG PO TABS
10.0000 mg | ORAL_TABLET | Freq: Every day | ORAL | Status: DC
  Administered 2014-12-20: 10 mg via ORAL
  Filled 2014-12-20 (×2): qty 1

## 2014-12-20 MED ORDER — HYDROMORPHONE HCL 1 MG/ML IJ SOLN: 0.5000 mg | INTRAMUSCULAR | Status: DC | PRN

## 2014-12-20 MED ORDER — ALUM & MAG HYDROXIDE-SIMETH 200-200-20 MG/5ML PO SUSP
30.0000 mL | Freq: Four times a day (QID) | ORAL | Status: DC | PRN
Start: 2014-12-20 — End: 2014-12-25

## 2014-12-20 MED ORDER — ACETAMINOPHEN 650 MG RE SUPP: 650.0000 mg | Freq: Four times a day (QID) | RECTAL | Status: DC | PRN

## 2014-12-20 MED ORDER — ATORVASTATIN CALCIUM 20 MG PO TABS
20.0000 mg | ORAL_TABLET | Freq: Every day | ORAL | Status: DC
  Filled 2014-12-20: qty 1

## 2014-12-20 MED ORDER — ACETAMINOPHEN 325 MG PO TABS
650.0000 mg | ORAL_TABLET | Freq: Four times a day (QID) | ORAL | Status: DC | PRN
  Administered 2014-12-21 – 2014-12-22 (×3): 650 mg via ORAL
  Filled 2014-12-20 (×3): qty 2

## 2014-12-20 NOTE — H&P (Signed)
[facility] Admission History and Physical       Sonnet Rizor FMB:[id number] DOB: Aug 01, 1941 DOA: 12/20/2014  Referring physician: EDP PCP: [name], MD  Specialists:   Chief Complaint: Rectal Bleeding  HPI: Marissa [name] is a 74 y.o. female with a history of CAD, Chronic Diastolic CHF, HTN, Stage II CKD, Diverticulosis, and Gout who presents to the ED with complaints of passing BRBPR x 10 since 5 AM.   She reports having lower ABD pain x 1 week Right > Left sided. She has had associated dizziness and weakness.  She denies nausea and vomiting and hematemesis and melena.    She reports having 2 previous episode of Diverticular bleeding in the past , once 3 years ago, and then around 7 years ago.   In the ED, she was found to have an initial hemoglobin of 8.9, and her last known hemoglobin was 11.0.   An FOBT was performed and was HEME  Positive, and she was found to be orthostatic.   A transfusion was started in the ED.  GI Dr. Benson Norway was consulted by the EDP Dr. Darl Householder.     Review of Systems:  Constitutional: No Weight Loss, No Weight Gain, Night Sweats, Fevers, Chills, Dizziness, Light Headedness, Fatigue, or Generalized Weakness HEENT: No Headaches, Difficulty Swallowing,Tooth/Dental Problems,Sore Throat,  No Sneezing, Rhinitis, Ear Ache, Nasal Congestion, or Post Nasal Drip,  Cardio-vascular:  No Chest pain, Orthopnea, PND, Edema in Lower Extremities, Anasarca, Dizziness, Palpitations  Resp: No Dyspnea, No DOE, No Productive Cough, No Non-Productive Cough, No Hemoptysis, No Wheezing.    GI: No Heartburn, Indigestion, +Abdominal Pain, Nausea, Vomiting, Diarrhea, Constipation, Hematemesis, +Hematochezia, Melena, Change in Bowel Habits,  Loss of Appetite  GU: No Dysuria, No Change in Color of Urine, No Urgency or Urinary Frequency, No Flank pain.  Musculoskeletal: No Joint Pain or Swelling, No Decreased Range of Motion, No Back Pain.  Neurologic: No Syncope, No Seizures, Muscle  Weakness, Paresthesia, Vision Disturbance or Loss, No Diplopia, No Vertigo, No Difficulty Walking,  Skin: No Rash or Lesions. Psych: No Change in Mood or Affect, No Depression or Anxiety, No Memory loss, No Confusion, or Hallucinations   Past Medical History  Diagnosis Date  . Hypertension   . Renal insufficiency     baseline Cr ~ 1.3  . Hyperlipidemia   . History of DVT (deep vein thrombosis)   . History of colonic polyps   . Gout   . Polyarthritis   . Microcytic anemia   . OSA (obstructive sleep apnea)   . GERD (gastroesophageal reflux disease)   . Esophagitis   . Hidradenitis suppurativa     s/p axillary sweat gland removal  . H/O blood transfusion reaction     at [facility] hospital  . Headache(784.0)   . Diverticulitis   . Difficulty sleeping   . PMB (postmenopausal bleeding)     X 2 YRS  . Bowel trouble     OCCASIONAL BOWEL INCONTINENCE  . Diabetes mellitus without complication     diet controlled  . Fibroids     s/p TAH/BSO 07/2012  . CAD (coronary artery disease)     a. 8/07 had BMS to OM.  b. In-stent restenosis with later Promus DES to same site.  c. Lexiscan myoview in 1/13 showed EF 67%, no ischemia or infarction.  d. Echo (2/13) showed EF 55-60%, moderate LVH, mild MR.  e. Lex MV 6/14: no isch, EF 53%;  f. Echo 6/14: mild LVH, EF 55-60%, Gr 1 DD,  MAC, mild MR, mild LAE  . Peripheral neuropathy   . Chronic diastolic CHF (congestive heart failure) 06/29/2014     Past Surgical History  Procedure Laterality Date  . Cervical fusion c-4-5 other    . Cervical laminectomy other    . Tonsillectomy other    . Umbilical hernia repair    . Axillary seat gland removal other    . Right knee arthroscopy other    . Colectomy  2004    left partial for perforation  . Cholecystectomy    . Tubal ligation    . Ptca and stenting      of mid and distal circumflex coronary artery. 1st stent was 04/2006, with drug eluting stent placed on 12/20/07 (30-50% RCA stenosis). Cardiologist  Dr. Einar Gip.  . Dilation and curettage of uterus  02/2011  . Hysteroscopy w/d&c  05/04/2012    Procedure: DILATATION AND CURETTAGE /HYSTEROSCOPY;  Surgeon: Lahoma Crocker, MD;  Location: [facility];  Service: Gynecology;  Laterality: N/A;  . Robotic assisted total hysterectomy with bilateral salpingo oopherectomy  07/24/2012    Procedure: ROBOTIC ASSISTED TOTAL HYSTERECTOMY WITH BILATERAL SALPINGO OOPHORECTOMY;  Surgeon: Janie Morning, MD PHD;  Location: [facility];  Service: Gynecology;  Laterality: Bilateral;  attempted ,  converted to abdomnial hysterectomy  . Abdominal hysterectomy  07/24/2012    Procedure: HYSTERECTOMY ABDOMINAL;  Surgeon: Janie Morning, MD PHD;  Location: [facility];  Service: Gynecology;;  . Salpingoophorectomy  07/24/2012    Procedure: SALPINGO OOPHORECTOMY;  Surgeon: Janie Morning, MD PHD;  Location: [facility];  Service: Gynecology;  Laterality: Bilateral;  . Abdominal wound dehiscence  08/01/2012    Procedure: ABDOMINAL WOUND DEHISCENCE;  Surgeon: Lahoma Crocker, MD;  Location: [facility];  Service: Gynecology;  Laterality: N/A;  . Laparotomy  08/02/2012    Procedure: EXPLORATORY LAPAROTOMY;  Surgeon: Imogene Burn. Georgette Dover, MD;  Location: [facility];  Service: General;  Laterality: N/A;  placement of abdominal wound vac      Prior to Admission medications   Medication Sig Start Date End Date Taking? Authorizing Provider  amLODipine (NORVASC) 10 MG tablet Take 1 tablet (10 mg total) by mouth daily. 05/29/14   Janith Lima, MD  aspirin EC 81 MG tablet 1 tablet every other day 02/26/13   Larey Dresser, MD  atorvastatin (LIPITOR) 20 MG tablet Take 20 mg by mouth daily. 11/03/14   Historical Provider, MD  cephALEXin (KEFLEX) 500 MG capsule Take 1 capsule (500 mg total) by mouth 4 (four) times daily. 11/03/14   Courtney Forcucci, PA-C  colchicine 0.6 MG tablet Take 1 tablet (0.6 mg total) by mouth daily as needed. 11/03/14   Golden Circle, FNP  erythromycin ophthalmic ointment Apply qid to  left upper lid lesion 11/26/14   Hendricks Limes, MD  furosemide (LASIX) 20 MG tablet Take 20 mg by mouth daily. 11/03/14   Historical Provider, MD  HYDROcodone-acetaminophen (NORCO/VICODIN) 5-325 MG per tablet Take 2 tablets by mouth every 4 (four) hours as needed for moderate pain or severe pain. 11/03/14   Courtney Forcucci, PA-C  indomethacin (INDOCIN) 50 MG capsule Take 1 capsule (50 mg total) by mouth 3 (three) times daily as needed. 11/03/14   Golden Circle, FNP  nitroGLYCERIN (NITROSTAT) 0.4 MG SL tablet Place 1 tablet (0.4 mg total) under the tongue every 5 (five) minutes as needed for chest pain. 02/26/13   Larey Dresser, MD  olmesartan-hydrochlorothiazide (BENICAR HCT) 20-12.5 MG per tablet Take 1 tablet by mouth every morning. 05/29/14  Janith Lima, MD  predniSONE (DELTASONE) 10 MG tablet Take 6 tablets (60 mg total) by mouth daily. 11/03/14   Courtney Forcucci, PA-C     No Known Allergies  Social History:  reports that she has never smoked. She has never used smokeless tobacco. She reports that she does not drink alcohol or use illicit drugs.    Family History  Problem Relation Age of Onset  . Diabetes Mother   . Coronary artery disease Mother   . Diabetes Sister   . Coronary artery disease Sister        Physical Exam:  GEN:  Pleasant Elderly Obese  74 y.o. African American female examined and in no acute distress; cooperative with exam Filed Vitals:   12/20/14 2000 12/20/14 2045 12/20/14 2101 12/20/14 2115  BP: 146/64 150/65 169/71   Pulse: 79 73 83   Temp:  98.1 F (36.7 C)  97.9 F (36.6 C)  TempSrc:  Oral  Oral  Resp: 18 16 16    Height:      Weight:      SpO2: 100% 100% 100%    Blood pressure 169/71, pulse 83, temperature 97.9 F (36.6 C), temperature source Oral, resp. rate 16, height 5\' 4"  (1.626 m), weight 102.967 kg (227 lb), SpO2 100 %. PSYCH: She is alert and oriented x4; does not appear anxious does not appear depressed; affect is normal HEENT:  Normocephalic and Atraumatic, Mucous membranes pink; PERRLA; EOM intact; Fundi:  Benign;  No scleral icterus, Nares: Patent, Oropharynx: Clear, Edentulous on Upper Palate, Fair Dentition on bottom,    Neck:  FROM, No Cervical Lymphadenopathy nor Thyromegaly or Carotid Bruit; No JVD; Breasts:: Not examined CHEST WALL: No tenderness CHEST: Normal respiration, clear to auscultation bilaterally HEART: Regular rate and rhythm; no murmurs rubs or gallops BACK: No kyphosis or scoliosis; No CVA tenderness ABDOMEN: Positive Bowel Sounds, Obese, Soft Non-Tender, No Rebound or Guarding; No Masses, No Organomegaly.    Rectal Exam: Not done EXTREMITIES: No Cyanosis, Clubbing, or Edema; No Ulcerations. Genitalia: not examined PULSES: 2+ and symmetric SKIN: Normal hydration no rash or ulceration CNS:  Alert and Oriented x 4, No Focal Deficits Vascular: pulses palpable throughout    Labs on Admission:  Basic Metabolic Panel:  Recent Labs Lab 12/20/14 1926  NA 139  K 3.7  CL 106  CO2 25  GLUCOSE 122*  BUN 19  CREATININE 1.55*  CALCIUM 9.1   Liver Function Tests:  Recent Labs Lab 12/20/14 1926  AST 19  ALT 11  ALKPHOS 82  BILITOT 0.4  PROT 6.7  ALBUMIN 3.2*   No results for input(s): LIPASE, AMYLASE in the last 168 hours. No results for input(s): AMMONIA in the last 168 hours. CBC:  Recent Labs Lab 12/20/14 1926  WBC 8.6  NEUTROABS 6.4  HGB 8.9*  HCT 29.4*  MCV 72.2*  PLT 301   Cardiac Enzymes: No results for input(s): CKTOTAL, CKMB, CKMBINDEX, TROPONINI in the last 168 hours.  BNP (last 3 results)  Recent Labs  11/02/14 2340  BNP 109.2*    ProBNP (last 3 results) No results for input(s): PROBNP in the last 8760 hours.  CBG: No results for input(s): GLUCAP in the last 168 hours.  Radiological Exams on Admission: No results found.   EKG: Independently reviewed. Normal sinus Rhythm Rate = 82   Assessment/Plan:   74 y.o. female with  Principal Problem:    1.   Lower GI bleed/GI bleed   Stepdown Monitoring  Transfuse PRN   Gentle IVFs   Monitor H/Hs   Hold Prednisone, Colchicine, NSAIDs, and ASA   GI consulted , Dr Benson Norway to see in AM      Active Problems:   2.   Coronary atherosclerosis   stable     3.   Chronic diastolic CHF (congestive heart failure)   Cardiac and O2 monitoring   Continue Furosemide, Toprol Rx   Monitor for Signs of Fluid Overload.           4.   Essential hypertension   Continue Amlodipine, and Furosemide as BP tolerates   Hold Olmesartan/HCTZ     5.   Chronic renal insufficiency, stage II (mild)   Monitor BUN/Cr     6.   Gout   Holding Colchicine, NSAIDs and     Discontinue HCTZ component from Olmesartan     7.   Hyperlipidemia with target LDL less than 100   Continue Atorvastatin Rx    8.   DVT Prophylaxis   SCDs          Code Status:     FULL CODE      Family Communication:   Family at Bedside    Disposition Plan:    Inpatient Status        Time spent: [facility] Pager [phone]   If [place] Please Contact the Day Rounding Team MD for [facility]  If 7PM-7AM, Please Contact Night-Floor Coverage  [email or web address] Password [facility] 12/20/2014, 9:34 PM     ADDENDUM:   Patient was seen and examined on 12/20/2014

## 2014-12-20 NOTE — ED Provider Notes (Signed)
CSN: [id number]     Arrival date & time 12/20/14  1908 History   First MD Initiated Contact with Patient 12/20/14 1915     Chief Complaint  Patient presents with  . Rectal Bleeding     (Consider location/radiation/quality/duration/timing/severity/associated sxs/prior Treatment) The history is provided by the patient.  Marissa [name] is a 74 y.o. female hx of HTN, DVT not on a coagulations here presenting with rectal bleeding. She has 5-10 times of bright red blood per rectum since 5:30 this a.m. Had some left lower quadrant pain that resolved. Denies any vomiting. She states that she has colon polyps in the past that required a partial bowel resection.    Past Medical History  Diagnosis Date  . Hypertension   . Renal insufficiency     baseline Cr ~ 1.3  . Hyperlipidemia   . History of DVT (deep vein thrombosis)   . History of colonic polyps   . Gout   . Polyarthritis   . Microcytic anemia   . OSA (obstructive sleep apnea)   . GERD (gastroesophageal reflux disease)   . Esophagitis   . Hidradenitis suppurativa     s/p axillary sweat gland removal  . H/O blood transfusion reaction     at [facility] hospital  . Headache(784.0)   . Diverticulitis   . Difficulty sleeping   . PMB (postmenopausal bleeding)     X 2 YRS  . Bowel trouble     OCCASIONAL BOWEL INCONTINENCE  . Diabetes mellitus without complication     diet controlled  . Fibroids     s/p TAH/BSO 07/2012  . CAD (coronary artery disease)     a. 8/07 had BMS to OM.  b. In-stent restenosis with later Promus DES to same site.  c. Lexiscan myoview in 1/13 showed EF 67%, no ischemia or infarction.  d. Echo (2/13) showed EF 55-60%, moderate LVH, mild MR.  e. Lex MV 6/14: no isch, EF 53%;  f. Echo 6/14: mild LVH, EF 55-60%, Gr 1 DD, MAC, mild MR, mild LAE  . Peripheral neuropathy   . Chronic diastolic CHF (congestive heart failure) 06/29/2014   Past Surgical History  Procedure Laterality Date  . Cervical fusion c-4-5 other    .  Cervical laminectomy other    . Tonsillectomy other    . Umbilical hernia repair    . Axillary seat gland removal other    . Right knee arthroscopy other    . Colectomy  2004    left partial for perforation  . Cholecystectomy    . Tubal ligation    . Ptca and stenting      of mid and distal circumflex coronary artery. 1st stent was 04/2006, with drug eluting stent placed on 12/20/07 (30-50% RCA stenosis). Cardiologist Dr. Einar Gip.  . Dilation and curettage of uterus  02/2011  . Hysteroscopy w/d&c  05/04/2012    Procedure: DILATATION AND CURETTAGE /HYSTEROSCOPY;  Surgeon: Lahoma Crocker, MD;  Location: [facility];  Service: Gynecology;  Laterality: N/A;  . Robotic assisted total hysterectomy with bilateral salpingo oopherectomy  07/24/2012    Procedure: ROBOTIC ASSISTED TOTAL HYSTERECTOMY WITH BILATERAL SALPINGO OOPHORECTOMY;  Surgeon: Janie Morning, MD PHD;  Location: [facility];  Service: Gynecology;  Laterality: Bilateral;  attempted ,  converted to abdomnial hysterectomy  . Abdominal hysterectomy  07/24/2012    Procedure: HYSTERECTOMY ABDOMINAL;  Surgeon: Janie Morning, MD PHD;  Location: [facility];  Service: Gynecology;;  . Salpingoophorectomy  07/24/2012    Procedure: SALPINGO OOPHORECTOMY;  Surgeon: Janie Morning, MD PHD;  Location: [facility];  Service: Gynecology;  Laterality: Bilateral;  . Abdominal wound dehiscence  08/01/2012    Procedure: ABDOMINAL WOUND DEHISCENCE;  Surgeon: Lahoma Crocker, MD;  Location: [facility];  Service: Gynecology;  Laterality: N/A;  . Laparotomy  08/02/2012    Procedure: EXPLORATORY LAPAROTOMY;  Surgeon: Imogene Burn. Georgette Dover, MD;  Location: [facility];  Service: General;  Laterality: N/A;  placement of abdominal wound vac   Family History  Problem Relation Age of Onset  . Diabetes Mother   . Coronary artery disease Mother   . Diabetes Sister   . Coronary artery disease Sister    History  Substance Use Topics  . Smoking status: Never Smoker   . Smokeless tobacco:  Never Used  . Alcohol Use: No   OB History    No data available     Review of Systems  Gastrointestinal: Positive for blood in stool and hematochezia.  All other systems reviewed and are negative.     Allergies  Review of patient's allergies indicates no known allergies.  Home Medications   Prior to Admission medications   Medication Sig Start Date End Date Taking? Authorizing Provider  amLODipine (NORVASC) 10 MG tablet Take 1 tablet (10 mg total) by mouth daily. 05/29/14   Janith Lima, MD  aspirin EC 81 MG tablet 1 tablet every other day 02/26/13   Larey Dresser, MD  cephALEXin (KEFLEX) 500 MG capsule Take 1 capsule (500 mg total) by mouth 4 (four) times daily. 11/03/14   Courtney Forcucci, PA-C  colchicine 0.6 MG tablet Take 1 tablet (0.6 mg total) by mouth daily as needed. 11/03/14   Golden Circle, FNP  erythromycin ophthalmic ointment Apply qid to left upper lid lesion 11/26/14   Hendricks Limes, MD  HYDROcodone-acetaminophen (NORCO/VICODIN) 5-325 MG per tablet Take 2 tablets by mouth every 4 (four) hours as needed for moderate pain or severe pain. 11/03/14   Courtney Forcucci, PA-C  indomethacin (INDOCIN) 50 MG capsule Take 1 capsule (50 mg total) by mouth 3 (three) times daily as needed. 11/03/14   Golden Circle, FNP  nitroGLYCERIN (NITROSTAT) 0.4 MG SL tablet Place 1 tablet (0.4 mg total) under the tongue every 5 (five) minutes as needed for chest pain. 02/26/13   Larey Dresser, MD  olmesartan-hydrochlorothiazide (BENICAR HCT) 20-12.5 MG per tablet Take 1 tablet by mouth every morning. 05/29/14   Janith Lima, MD  predniSONE (DELTASONE) 10 MG tablet Take 6 tablets (60 mg total) by mouth daily. 11/03/14   Courtney Forcucci, PA-C   BP 169/71 mmHg  Pulse 83  Temp(Src) 98.1 F (36.7 C) (Oral)  Resp 16  Ht 5\' 4"  (1.626 m)  Wt 227 lb (102.967 kg)  BMI 38.95 kg/m2  SpO2 100% Physical Exam  Constitutional: She is oriented to person, place, and time.  Chronically ill    HENT:  Head: Normocephalic.  MM slightly dry   Eyes: Conjunctivae are normal. Pupils are equal, round, and reactive to light.  Neck: Normal range of motion. Neck supple.  Cardiovascular: Normal rate, regular rhythm and normal heart sounds.   Pulmonary/Chest: Effort normal and breath sounds normal. No respiratory distress. She has no wheezes. She has no rales.  Abdominal: Soft. Bowel sounds are normal. She exhibits no distension. There is no tenderness. There is no rebound and no guarding.  Genitourinary:  Bright red blood   Musculoskeletal: Normal range of motion. She exhibits no edema or tenderness.  Neurological:  She is alert and oriented to person, place, and time. No cranial nerve deficit. Coordination normal.  Skin: Skin is dry.  Psychiatric: She has a normal mood and affect. Her behavior is normal. Judgment and thought content normal.  Nursing note and vitals reviewed.   ED Course  Procedures (including critical care time)  CRITICAL CARE Performed by: Darl Householder, DAVID   Total critical care time: 30 min   Critical care time was exclusive of separately billable procedures and treating other patients.  Critical care was necessary to treat or prevent imminent or life-threatening deterioration.  Critical care was time spent personally by me on the following activities: development of treatment plan with patient and/or surrogate as well as nursing, discussions with consultants, evaluation of patient's response to treatment, examination of patient, obtaining history from patient or surrogate, ordering and performing treatments and interventions, ordering and review of laboratory studies, ordering and review of radiographic studies, pulse oximetry and re-evaluation of patient's condition.   Labs Review Labs Reviewed  CBC WITH DIFFERENTIAL/PLATELET - Abnormal; Notable for the following:    Hemoglobin 8.9 (*)    HCT 29.4 (*)    MCV 72.2 (*)    MCH 21.9 (*)    All other components  within normal limits  COMPREHENSIVE METABOLIC PANEL - Abnormal; Notable for the following:    Glucose, Bld 122 (*)    Creatinine, Ser 1.55 (*)    Albumin 3.2 (*)    GFR calc non Af Amer 32 (*)    GFR calc Af Amer 37 (*)    All other components within normal limits  POC OCCULT BLOOD, ED - Abnormal; Notable for the following:    Fecal Occult Bld POSITIVE (*)    All other components within normal limits  PROTIME-INR  TYPE AND SCREEN  PREPARE RBC (CROSSMATCH)    Imaging Review No results found.   EKG Interpretation   Date/Time:  Saturday December 20 2014 19:20:45 EDT Ventricular Rate:  82 PR Interval:  146 QRS Duration: 73 QT Interval:  369 QTC Calculation: 431 R Axis:   13 Text Interpretation:  Sinus rhythm Borderline T abnormalities, inferior  leads Baseline wander in lead(s) V3 No significant change since last  tracing Confirmed by YAO  MD, DAVID ([id number]) on 12/20/2014 7:29:22 PM      MDM   Final diagnoses:  None    Marissa [name] is a 74 y.o. female here with bright red blood per rectum. Concerned for lower GI bleed. Will get labs, type and screen. Will need admission.   9:03 PM Hg 8.9. Occ positive. Given PRBC 1 U. Dr. Benson Norway from GI to see patient. Will admit to stepdown.   Wandra Arthurs, MD 12/20/14 2104

## 2014-12-20 NOTE — ED Notes (Signed)
Pt is from home, pt reports bright red rectal bleeding since 0530 this morning, pt reported LLQ pain earlier today but denies is at this time. EMS report pt reported dizziness with movement and that the pt was orthostatic. A&O X4.

## 2014-12-20 NOTE — Progress Notes (Signed)
Pt arrived from ED at 2215. A/O x4 with no complaints of pain.  1U PRBC's infusing.  Will continue to monitor.

## 2014-12-21 DIAGNOSIS — D509 Iron deficiency anemia, unspecified: Secondary | ICD-10-CM

## 2014-12-21 LAB — BASIC METABOLIC PANEL
Anion gap: 11 (ref 5–15)
BUN: 18 mg/dL (ref 6–23)
CHLORIDE: 108 mmol/L (ref 96–112)
CO2: 19 mmol/L (ref 19–32)
Calcium: 8.6 mg/dL (ref 8.4–10.5)
Creatinine, Ser: 1.38 mg/dL — ABNORMAL HIGH (ref 0.50–1.10)
GFR calc Af Amer: 42 mL/min — ABNORMAL LOW (ref >90)
GFR calc non Af Amer: 37 mL/min — ABNORMAL LOW (ref >90)
Glucose, Bld: 96 mg/dL (ref 70–99)
POTASSIUM: 3.9 mmol/L (ref 3.5–5.1)
SODIUM: 138 mmol/L (ref 135–145)

## 2014-12-21 LAB — HEMOGLOBIN AND HEMATOCRIT, BLOOD
HCT: 26.5 % — ABNORMAL LOW (ref 36.0–46.0)
HCT: 28.2 % — ABNORMAL LOW (ref 36.0–46.0)
Hemoglobin: 8.1 g/dL — ABNORMAL LOW (ref 12.0–15.0)
Hemoglobin: 9 g/dL — ABNORMAL LOW (ref 12.0–15.0)

## 2014-12-21 LAB — CBC
HCT: 27.9 % — ABNORMAL LOW (ref 36.0–46.0)
HEMOGLOBIN: 8.6 g/dL — AB (ref 12.0–15.0)
MCH: 22.5 pg — ABNORMAL LOW (ref 26.0–34.0)
MCHC: 30.8 g/dL (ref 30.0–36.0)
MCV: 73 fL — ABNORMAL LOW (ref 78.0–100.0)
PLATELETS: 252 10*3/uL (ref 150–400)
RBC: 3.82 MIL/uL — ABNORMAL LOW (ref 3.87–5.11)
RDW: 14.4 % (ref 11.5–15.5)
WBC: 7.6 10*3/uL (ref 4.0–10.5)

## 2014-12-21 LAB — TSH: TSH: 2.4 u[IU]/mL (ref 0.350–4.500)

## 2014-12-21 LAB — MRSA PCR SCREENING: MRSA BY PCR: NEGATIVE

## 2014-12-21 MED ORDER — OXYCODONE HCL 5 MG PO TABS
5.0000 mg | ORAL_TABLET | ORAL | Status: DC | PRN
  Administered 2014-12-21 – 2014-12-23 (×5): 5 mg via ORAL
  Filled 2014-12-21 (×6): qty 1

## 2014-12-21 MED ORDER — DICLOFENAC SODIUM 1 % TD GEL
4.0000 g | Freq: Four times a day (QID) | TRANSDERMAL | Status: DC
  Administered 2014-12-21 – 2014-12-25 (×15): 4 g via TOPICAL
  Filled 2014-12-21: qty 100

## 2014-12-21 MED ORDER — METOPROLOL TARTRATE 12.5 MG HALF TABLET
12.5000 mg | ORAL_TABLET | Freq: Two times a day (BID) | ORAL | Status: DC
Start: 2014-12-21 — End: 2014-12-23
  Administered 2014-12-21 – 2014-12-22 (×4): 12.5 mg via ORAL
  Filled 2014-12-21 (×6): qty 1

## 2014-12-21 MED ORDER — ATORVASTATIN CALCIUM 40 MG PO TABS
40.0000 mg | ORAL_TABLET | Freq: Every day | ORAL | Status: DC
  Administered 2014-12-21 – 2014-12-25 (×5): 40 mg via ORAL
  Filled 2014-12-21 (×5): qty 1

## 2014-12-21 NOTE — Plan of Care (Signed)
Problem: Phase I Progression Outcomes Goal: Pain controlled with appropriate interventions Outcome: Completed/Met Date Met:  12/21/14 Pt has no complaints of abdominal pain

## 2014-12-21 NOTE — Consult Note (Signed)
Unassigned Consult  Reason for Consult: Diverticular bleed Referring Physician: [facility]  Jovita Gamma HPI: This is a 74 year old female with a PMH of CHF, HTN, Stage II CKD, CAD, and diverticular bleed in the past who was admitted with complaints of hematochezia.  The patient states taht she had two prior episodes of diverticular bleeds, 3 years and 7 years ago.  During this episode she started to have some lower abdominal pain, which was worse on the right than the left.  She states that a colonoscopy was attempted in the past, but it was not able to be performed as there was a "blockage".  She required surgical resection of the area and she also reports two additional surgeries for her colon.  No reports of nausea, vomiting, hematemesis, or melena.  A CT scan in 2012 did reveal extensive diverticulosis in the distal transverse to proximal descending colon.  Her HGB dropped from 11 down to 8.9 g/dL and she was noted to be orthostatic.  Past Medical History  Diagnosis Date  . Hypertension   . Renal insufficiency     baseline Cr ~ 1.3  . Hyperlipidemia   . History of DVT (deep vein thrombosis)   . History of colonic polyps   . Gout   . Polyarthritis   . Microcytic anemia   . OSA (obstructive sleep apnea)   . GERD (gastroesophageal reflux disease)   . Esophagitis   . Hidradenitis suppurativa     s/p axillary sweat gland removal  . H/O blood transfusion reaction     at [facility] hospital  . Headache(784.0)   . Diverticulitis   . Difficulty sleeping   . PMB (postmenopausal bleeding)     X 2 YRS  . Bowel trouble     OCCASIONAL BOWEL INCONTINENCE  . Diabetes mellitus without complication     diet controlled  . Fibroids     s/p TAH/BSO 07/2012  . CAD (coronary artery disease)     a. 8/07 had BMS to OM.  b. In-stent restenosis with later Promus DES to same site.  c. Lexiscan myoview in 1/13 showed EF 67%, no ischemia or infarction.  d. Echo (2/13) showed EF 55-60%, moderate LVH,  mild MR.  e. Lex MV 6/14: no isch, EF 53%;  f. Echo 6/14: mild LVH, EF 55-60%, Gr 1 DD, MAC, mild MR, mild LAE  . Peripheral neuropathy   . Chronic diastolic CHF (congestive heart failure) 06/29/2014    Past Surgical History  Procedure Laterality Date  . Cervical fusion c-4-5 other    . Cervical laminectomy other    . Tonsillectomy other    . Umbilical hernia repair    . Axillary seat gland removal other    . Right knee arthroscopy other    . Colectomy  2004    left partial for perforation  . Cholecystectomy    . Tubal ligation    . Ptca and stenting      of mid and distal circumflex coronary artery. 1st stent was 04/2006, with drug eluting stent placed on 12/20/07 (30-50% RCA stenosis). Cardiologist Dr. Einar Gip.  . Dilation and curettage of uterus  02/2011  . Hysteroscopy w/d&c  05/04/2012    Procedure: DILATATION AND CURETTAGE /HYSTEROSCOPY;  Surgeon: Lahoma Crocker, MD;  Location: [facility];  Service: Gynecology;  Laterality: N/A;  . Robotic assisted total hysterectomy with bilateral salpingo oopherectomy  07/24/2012    Procedure: ROBOTIC ASSISTED TOTAL HYSTERECTOMY WITH BILATERAL SALPINGO OOPHORECTOMY;  Surgeon: Janie Morning, MD  PHD;  Location: [facility];  Service: Gynecology;  Laterality: Bilateral;  attempted ,  converted to abdomnial hysterectomy  . Abdominal hysterectomy  07/24/2012    Procedure: HYSTERECTOMY ABDOMINAL;  Surgeon: Janie Morning, MD PHD;  Location: [facility];  Service: Gynecology;;  . Salpingoophorectomy  07/24/2012    Procedure: SALPINGO OOPHORECTOMY;  Surgeon: Janie Morning, MD PHD;  Location: [facility];  Service: Gynecology;  Laterality: Bilateral;  . Abdominal wound dehiscence  08/01/2012    Procedure: ABDOMINAL WOUND DEHISCENCE;  Surgeon: Lahoma Crocker, MD;  Location: [facility];  Service: Gynecology;  Laterality: N/A;  . Laparotomy  08/02/2012    Procedure: EXPLORATORY LAPAROTOMY;  Surgeon: Imogene Burn. Georgette Dover, MD;  Location: [facility];  Service: General;  Laterality:  N/A;  placement of abdominal wound vac    Family History  Problem Relation Age of Onset  . Diabetes Mother   . Coronary artery disease Mother   . Diabetes Sister   . Coronary artery disease Sister     Social History:  reports that she has never smoked. She has never used smokeless tobacco. She reports that she does not drink alcohol or use illicit drugs.  Allergies: No Known Allergies  Medications:  Scheduled: . sodium chloride   Intravenous STAT  . amLODipine  10 mg Oral Daily  . atorvastatin  20 mg Oral Daily  . pantoprazole (PROTONIX) IV  40 mg Intravenous Q12H   Continuous: . sodium chloride 50 mL/hr at 12/21/14 0114    Results for orders placed or performed during the hospital encounter of 12/20/14 (from the past 24 hour(s))  Type and screen     Status: None (Preliminary result)   Collection Time: 12/20/14  7:25 PM  Result Value Ref Range   ABO/RH(D) B POS    Antibody Screen NEG    Sample Expiration 12/23/2014    Unit Number [id number]    Blood Component Type RED CELLS,LR    Unit division 00    Status of Unit ISSUED    Transfusion Status OK TO TRANSFUSE    Crossmatch Result Compatible   CBC with Differential     Status: Abnormal   Collection Time: 12/20/14  7:26 PM  Result Value Ref Range   WBC 8.6 4.0 - 10.5 K/uL   RBC 4.07 3.87 - 5.11 MIL/uL   Hemoglobin 8.9 (L) 12.0 - 15.0 g/dL   HCT 29.4 (L) 36.0 - 46.0 %   MCV 72.2 (L) 78.0 - 100.0 fL   MCH 21.9 (L) 26.0 - 34.0 pg   MCHC 30.3 30.0 - 36.0 g/dL   RDW 14.1 11.5 - 15.5 %   Platelets 301 150 - 400 K/uL   Neutrophils Relative % 75 43 - 77 %   Lymphocytes Relative 17 12 - 46 %   Monocytes Relative 6 3 - 12 %   Eosinophils Relative 1 0 - 5 %   Basophils Relative 1 0 - 1 %   Neutro Abs 6.4 1.7 - 7.7 K/uL   Lymphs Abs 1.5 0.7 - 4.0 K/uL   Monocytes Absolute 0.5 0.1 - 1.0 K/uL   Eosinophils Absolute 0.1 0.0 - 0.7 K/uL   Basophils Absolute 0.1 0.0 - 0.1 K/uL   Smear Review MORPHOLOGY UNREMARKABLE    Comprehensive metabolic panel     Status: Abnormal   Collection Time: 12/20/14  7:26 PM  Result Value Ref Range   Sodium 139 135 - 145 mmol/L   Potassium 3.7 3.5 - 5.1 mmol/L   Chloride 106 96 - 112  mmol/L   CO2 25 19 - 32 mmol/L   Glucose, Bld 122 (H) 70 - 99 mg/dL   BUN 19 6 - 23 mg/dL   Creatinine, Ser 1.55 (H) 0.50 - 1.10 mg/dL   Calcium 9.1 8.4 - 10.5 mg/dL   Total Protein 6.7 6.0 - 8.3 g/dL   Albumin 3.2 (L) 3.5 - 5.2 g/dL   AST 19 0 - 37 U/L   ALT 11 0 - 35 U/L   Alkaline Phosphatase 82 39 - 117 U/L   Total Bilirubin 0.4 0.3 - 1.2 mg/dL   GFR calc non Af Amer 32 (L) >90 mL/min   GFR calc Af Amer 37 (L) >90 mL/min   Anion gap 8 5 - 15  Protime-INR     Status: None   Collection Time: 12/20/14  7:26 PM  Result Value Ref Range   Prothrombin Time 13.7 11.6 - 15.2 seconds   INR 1.04 0.00 - 1.49  POC occult blood, ED     Status: Abnormal   Collection Time: 12/20/14  7:35 PM  Result Value Ref Range   Fecal Occult Bld POSITIVE (A) NEGATIVE  Prepare RBC     Status: None   Collection Time: 12/20/14  7:59 PM  Result Value Ref Range   Order Confirmation ORDER PROCESSED BY BLOOD BANK   MRSA PCR Screening     Status: None   Collection Time: 12/20/14 10:39 PM  Result Value Ref Range   MRSA by PCR NEGATIVE NEGATIVE  Basic metabolic panel     Status: Abnormal   Collection Time: 12/21/14  4:37 AM  Result Value Ref Range   Sodium 138 135 - 145 mmol/L   Potassium 3.9 3.5 - 5.1 mmol/L   Chloride 108 96 - 112 mmol/L   CO2 19 19 - 32 mmol/L   Glucose, Bld 96 70 - 99 mg/dL   BUN 18 6 - 23 mg/dL   Creatinine, Ser 1.38 (H) 0.50 - 1.10 mg/dL   Calcium 8.6 8.4 - 10.5 mg/dL   GFR calc non Af Amer 37 (L) >90 mL/min   GFR calc Af Amer 42 (L) >90 mL/min   Anion gap 11 5 - 15  CBC     Status: Abnormal   Collection Time: 12/21/14  4:37 AM  Result Value Ref Range   WBC 7.6 4.0 - 10.5 K/uL   RBC 3.82 (L) 3.87 - 5.11 MIL/uL   Hemoglobin 8.6 (L) 12.0 - 15.0 g/dL   HCT 27.9 (L) 36.0 - 46.0  %   MCV 73.0 (L) 78.0 - 100.0 fL   MCH 22.5 (L) 26.0 - 34.0 pg   MCHC 30.8 30.0 - 36.0 g/dL   RDW 14.4 11.5 - 15.5 %   Platelets 252 150 - 400 K/uL     No results found.  ROS:  As stated above in the HPI otherwise negative.  Blood pressure 138/64, pulse 74, temperature 97.8 F (36.6 C), temperature source Oral, resp. rate 18, height 5\' 4"  (1.626 m), weight 102.9 kg (226 lb 13.7 oz), SpO2 98 %.    PE: Gen: NAD, Alert and Oriented HEENT:  [place]/AT, EOMI Neck: Supple, no LAD Lungs: CTA Bilaterally CV: RRR without M/G/R ABM: Soft, NTND, +BS, large midline incisional scar Ext: No C/C/E  Assessment/Plan: 1) Diverticular bleed. 2) Anemia. 3) Orthostasis.   The patient has a large midline abdominal scar, but I am unable to find any notes or pathology for any colonic resection.  She has had multiple Gyn/Onc interventions.  In the EPIC system there is a personal report of colonic polyps.    Plan: 1) Follow HGB. 2) Transfuse as necessary. 3) Colonoscopy tomorrow.  Taelor Moncada D 12/21/2014, 8:45 AM

## 2014-12-21 NOTE — Progress Notes (Addendum)
[facility] PROGRESS NOTE  Marissa [name] YKD:[id number] DOB: 1940/10/25 DOA: 12/20/2014 PCP: [name], MD  Brief Summary  Marissa [name] is a 74 y.o. female with a history of CAD, Chronic Diastolic CHF, HTN, Stage II CKD, Diverticulosis, and Gout who presents to the ED with complaints of passing BRBPR x 10 since 5 AM. She reported having lower abdominal pain for 1 week prior to admission, right more than left sided and associated with dizziness and weakness. She denied nausea and vomiting and hematemesis and melena.She reported 2 previous episodes of diverticular bleeding, 7 and 3 years ago. In the ED, she was found to have an initial hemoglobin of 8.9, and her last known hemoglobin was 11.0. An FOBT was performed and was HEME positive, and she was found to be orthostatic. A transfusion was started in the ED. GI Dr. Benson Norway was consulted by the EDP Dr. Darl Householder.Despite blood transfusion hemoglobin trended down from 8.9-8.6. She has not had further rectal bleeding since admission.  Assessment/Plan  Acute lower GI bleed likely secondary to diverticulosis -  Blood pressure proximally stable -  Telemetry demonstrated normal sinus rhythm -  Continue IV fluids -  Repeat hemoglobin this afternoon -  Avoid prednisone, NSAIDs, aspirin -  Gastroenterology to consult  GERD -  Continue Protonix twice a day  CAD, chest pain free -  Holding aspirin -  Start beta blocker -  Recommend increasing statin to high-dose  Chronic diastolic heart failure, appears euvolemic -  Daily weights -  Once diet advanced, low sodium diet  Essential hypertension/hyperlipidemia, blood pressure low normal -  Continue to hold all blood pressure medications except for low dose beta blocker -  Increasing statin secondary to CAD  CKD stage II, creatinine at baseline of 1.4-1.5  -  Minimize nephrotoxins and renally dose medications   Acute blood loss anemia superimposed on microcytic anemia -  Iron  studies, B12, folate, TSH -  Transfuse for hemoglobin less than 7 or evidence of hemodynamic instability with ongoing bleeding -  Will need iron supplementation at discharge  Gout/Arthritis -  Holding NSAIDS and prednisone -  Hold colchicine due to diarrhea -  Start diclofenac gel for now  Diet:   Clear liquid diet Access:   PIV IVF:   Yes Proph:   SCDs  Code Status:  full code Family Communication:  patient alone Disposition Plan:  pending improvement in joint pains, resolution of GI bleed   Consultants:   gastroenterology  Procedures:   none  Antibiotics:   none   HPI/Subjective:   Patient states that she has chronic abdominal pain which has been worse over the last week. Denies bowel movement since admission. States that for the last year she has had some bladder pain with urgency and incontinence. She has chronic arthritis which is worse over the last few days particularly in her hands, knees, and feet   Objective: Filed Vitals:   12/20/14 2324 12/20/14 2354 12/21/14 0000 12/21/14 0401  BP: 159/77 155/79 144/94 138/64  Pulse: 77  77 74  Temp: 98.1 F (36.7 C)  98 F (36.7 C) 97.8 F (36.6 C)  TempSrc: Oral  Oral Oral  Resp: 16  18 18   Height:      Weight:      SpO2: 99%  100% 98%    Intake/Output Summary (Last 24 hours) at 12/21/14 0845 Last data filed at 12/21/14 0500  Gross per 24 hour  Intake    200 ml  Output  75 ml  Net    125 ml   Filed Weights   12/20/14 1918 12/20/14 1923 12/20/14 2320  Weight: 102.967 kg (227 lb) 102.967 kg (227 lb) 102.9 kg (226 lb 13.7 oz)    Exam:   General:   Obese female,No acute distress  HEENT:  NCAT, MMM  Cardiovascular:  RRR, nl S1, S2 no mrg, 2+ pulses, warm extremities  Respiratory:  CTAB, no increased WOB  Abdomen:   NABS, soft, nondistended, tender to palpation in the periumbilical and epigastric areas, no rebound or guarding   MSK:   Normal tone and bulk,  mild joint swelling of her IP joints,  tenderness to palpation along the left foot navicular line, 1+ pitting and nonpitting edema around bilateral ankles, Grossly intact.  Bilateral knee effusions with mild erythema of right knee and mildly warm to touch.    Data Reviewed: Basic Metabolic Panel:  Recent Labs Lab 12/20/14 1926 12/21/14 0437  NA 139 138  K 3.7 3.9  CL 106 108  CO2 25 19  GLUCOSE 122* 96  BUN 19 18  CREATININE 1.55* 1.38*  CALCIUM 9.1 8.6   Liver Function Tests:  Recent Labs Lab 12/20/14 1926  AST 19  ALT 11  ALKPHOS 82  BILITOT 0.4  PROT 6.7  ALBUMIN 3.2*   No results for input(s): LIPASE, AMYLASE in the last 168 hours. No results for input(s): AMMONIA in the last 168 hours. CBC:  Recent Labs Lab 12/20/14 1926 12/21/14 0437  WBC 8.6 7.6  NEUTROABS 6.4  --   HGB 8.9* 8.6*  HCT 29.4* 27.9*  MCV 72.2* 73.0*  PLT 301 252   Cardiac Enzymes: No results for input(s): CKTOTAL, CKMB, CKMBINDEX, TROPONINI in the last 168 hours. BNP (last 3 results)  Recent Labs  11/02/14 2340  BNP 109.2*    ProBNP (last 3 results) No results for input(s): PROBNP in the last 8760 hours.  CBG: No results for input(s): GLUCAP in the last 168 hours.  Recent Results (from the past 240 hour(s))  MRSA PCR Screening     Status: None   Collection Time: 12/20/14 10:39 PM  Result Value Ref Range Status   MRSA by PCR NEGATIVE NEGATIVE Final    Comment:        The GeneXpert MRSA Assay (FDA approved for NASAL specimens only), is one component of a comprehensive MRSA colonization surveillance program. It is not intended to diagnose MRSA infection nor to guide or monitor treatment for MRSA infections.      Studies: No results found.  Scheduled Meds: . sodium chloride   Intravenous STAT  . amLODipine  10 mg Oral Daily  . atorvastatin  20 mg Oral Daily  . pantoprazole (PROTONIX) IV  40 mg Intravenous Q12H   Continuous Infusions: . sodium chloride 50 mL/hr at 12/21/14 0114    Principal  Problem:   Lower GI bleed Active Problems:   Gout   Essential hypertension   Coronary atherosclerosis   Chronic renal insufficiency, stage II (mild)   Hyperlipidemia with target LDL less than 100   Chronic diastolic CHF (congestive heart failure)   GI bleed    Time spent: 30 min    Marissa [name], [facility] Pager [phone]. If 7PM-7AM, please contact night-coverage at [email or web address], password [email or web address] 12/21/2014, 8:45 AM  LOS: 1 day

## 2014-12-22 ENCOUNTER — Encounter (HOSPITAL_COMMUNITY): Payer: Self-pay | Admitting: *Deleted

## 2014-12-22 ENCOUNTER — Encounter (HOSPITAL_COMMUNITY)
Admission: EM | Disposition: A | Discharge status: Home Health | Payer: Self-pay | Source: Home / Self Care | Attending: Internal Medicine

## 2014-12-22 DIAGNOSIS — D62 Acute posthemorrhagic anemia: Secondary | ICD-10-CM

## 2014-12-22 HISTORY — PX: COLONOSCOPY: SHX5424

## 2014-12-22 LAB — BASIC METABOLIC PANEL
Anion gap: 14 (ref 5–15)
BUN: 13 mg/dL (ref 6–23)
CALCIUM: 9.2 mg/dL (ref 8.4–10.5)
CO2: 18 mmol/L — ABNORMAL LOW (ref 19–32)
Chloride: 106 mmol/L (ref 96–112)
Creatinine, Ser: 1.53 mg/dL — ABNORMAL HIGH (ref 0.50–1.10)
GFR calc Af Amer: 38 mL/min — ABNORMAL LOW (ref >90)
GFR, EST NON AFRICAN AMERICAN: 32 mL/min — AB (ref >90)
GLUCOSE: 138 mg/dL — AB (ref 70–99)
Potassium: 3.9 mmol/L (ref 3.5–5.1)
SODIUM: 138 mmol/L (ref 135–145)

## 2014-12-22 LAB — HEMOGLOBIN AND HEMATOCRIT, BLOOD
HCT: 31 % — ABNORMAL LOW (ref 36.0–46.0)
HEMOGLOBIN: 9.1 g/dL — AB (ref 12.0–15.0)

## 2014-12-22 LAB — IRON AND TIBC
Iron: 64 ug/dL (ref 42–145)
Saturation Ratios: 24 % (ref 20–55)
TIBC: 263 ug/dL (ref 250–470)
UIBC: 199 ug/dL (ref 125–400)

## 2014-12-22 LAB — VITAMIN B12: Vitamin B-12: 282 pg/mL (ref 211–911)

## 2014-12-22 LAB — FOLATE: Folate: 7.1 ng/mL

## 2014-12-22 LAB — FERRITIN: Ferritin: 82 ng/mL (ref 10–291)

## 2014-12-22 SURGERY — COLONOSCOPY
Anesthesia: Moderate Sedation

## 2014-12-22 MED ORDER — SODIUM CHLORIDE 0.9 % IV SOLN
INTRAVENOUS | Status: DC
Start: 2014-12-22 — End: 2014-12-23

## 2014-12-22 MED ORDER — DIPHENHYDRAMINE HCL 50 MG/ML IJ SOLN
INTRAMUSCULAR | Status: AC
  Filled 2014-12-22: qty 1

## 2014-12-22 MED ORDER — PEG 3350-KCL-NA BICARB-NACL 420 G PO SOLR
4000.0000 mL | Freq: Once | ORAL | Status: AC
  Administered 2014-12-22: 4000 mL via ORAL
  Filled 2014-12-22: qty 4000

## 2014-12-22 MED ORDER — MIDAZOLAM HCL 5 MG/ML IJ SOLN
INTRAMUSCULAR | Status: AC
  Filled 2014-12-22: qty 2

## 2014-12-22 MED ORDER — MIDAZOLAM HCL 5 MG/5ML IJ SOLN
INTRAMUSCULAR | Status: DC | PRN
  Administered 2014-12-22 (×2): 2 mg via INTRAVENOUS

## 2014-12-22 MED ORDER — FENTANYL CITRATE 0.05 MG/ML IJ SOLN
INTRAMUSCULAR | Status: AC
Start: 2014-12-22 — End: 2014-12-22
  Filled 2014-12-22: qty 2

## 2014-12-22 MED ORDER — FENTANYL CITRATE 0.05 MG/ML IJ SOLN
INTRAMUSCULAR | Status: DC | PRN
  Administered 2014-12-22 (×2): 25 ug via INTRAVENOUS

## 2014-12-22 NOTE — Progress Notes (Signed)
[facility] PROGRESS NOTE  Marissa [name] HGD:[id number] DOB: [birth year] DOA: 12/20/2014 PCP: [name], MD  Brief Summary  Marissa [name] is a 74 y.o. female with a history of CAD, Chronic Diastolic CHF, HTN, Stage II CKD, Diverticulosis, and Gout who presents to the ED with complaints of passing BRBPR x 10 since 5 AM. She reported having lower abdominal pain for 1 week prior to admission, right more than left sided and associated with dizziness and weakness. She denied nausea and vomiting and hematemesis and melena.She reported 2 previous episodes of diverticular bleeding, 7 and 3 years ago. In the ED, she was found to have an initial hemoglobin of 8.9, and her last known hemoglobin was 11.0. An FOBT was performed and was HEME positive, and she was found to be orthostatic. A transfusion was started in the ED. GI Dr. Benson Norway was consulted by the EDP Dr. Darl Householder.Despite blood transfusion hemoglobin trended down from 8.9-8.6.  She completed prep for colonoscopy. She states that initially her stools were red tinged but she thinks her more recent bowel movements having clear .  Assessment/Plan  Acute lower GI bleed likely secondary to diverticulosis -  Blood pressure low normal -  Telemetry:  normal sinus rhythm -  Continue IV fluids -  Avoid prednisone, NSAIDs, aspirin -  Appreciate Gastroenterology assistance -  Colonoscopy today  GERD, stable, continue Protonix  CAD, chest pain free -  Holding aspirin due to GI bleed -  Started beta blocker -  Increased statin to high-dose  Chronic diastolic heart failure, appears euvolemic -  Daily weights -  Once diet advanced, low sodium diet  Essential hypertension/hyperlipidemia, blood pressure low normal -  Continue to hold all blood pressure medications except for low dose beta blocker -  Increasing statin secondary to CAD  CKD stage II, creatinine at baseline of 1.4-1.5 -  Minimize nephrotoxins and renally dose medications    Acute blood loss anemia superimposed on microcytic anemia -  Iron studies, B12, folate pending -  TSH 2.4 -  Transfuse for hemoglobin less than 7 or evidence of hemodynamic instability with ongoing bleeding -  Will need iron supplementation at discharge  Gout/Arthritis -  Holding NSAIDS and prednisone -  Hold colchicine due to diarrhea -  Start diclofenac gel for now  Diet:   Nothing by mouth Access:   PIV IVF:   Yes Proph:   SCDs  Code Status:  full code Family Communication:  patient alone Disposition Plan:  pending improvement in joint pains, resolution of GI bleed   Consultants:   gastroenterology  Procedures:   none  Antibiotics:   none   HPI/Subjective:  Abdominal pain is improved. She had some vomiting while drinking her prep for colonoscopy. She pooped red tinged water initially but thinks that her stools are clear now. She continues to have severe pain in her feet.  Objective: Filed Vitals:   12/21/14 2224 12/22/14 0025 12/22/14 0200 12/22/14 0431  BP: 156/61 98/51 134/56 112/49  Pulse: 82 72 73 72  Temp:  98.2 F (36.8 C)  97.7 F (36.5 C)  TempSrc:  Oral  Oral  Resp:  13 14 13   Height:      Weight:      SpO2:  99% 100% 100%    Intake/Output Summary (Last 24 hours) at 12/22/14 0812 Last data filed at 12/22/14 0600  Gross per 24 hour  Intake   1100 ml  Output    650 ml  Net    450  ml   Filed Weights   12/20/14 1918 12/20/14 1923 12/20/14 2320  Weight: 102.967 kg (227 lb) 102.967 kg (227 lb) 102.9 kg (226 lb 13.7 oz)    Exam:   General:   Obese female,No acute distress  HEENT:  NCAT, MMM  Cardiovascular:  RRR, nl S1, S2 no mrg, 2+ pulses, warm extremities  Respiratory:  CTAB, no increased WOB  Abdomen:   NABS, soft, nondistended, nontender   MSK:   Normal tone and bulk, tenderness to palpation along the left foot navicular line, 1+ pitting and nonpitting edema around bilateral ankles, Grossly intact.    Data Reviewed: Basic  Metabolic Panel:  Recent Labs Lab 12/20/14 1926 12/21/14 0437  NA 139 138  K 3.7 3.9  CL 106 108  CO2 25 19  GLUCOSE 122* 96  BUN 19 18  CREATININE 1.55* 1.38*  CALCIUM 9.1 8.6   Liver Function Tests:  Recent Labs Lab 12/20/14 1926  AST 19  ALT 11  ALKPHOS 82  BILITOT 0.4  PROT 6.7  ALBUMIN 3.2*   No results for input(s): LIPASE, AMYLASE in the last 168 hours. No results for input(s): AMMONIA in the last 168 hours. CBC:  Recent Labs Lab 12/20/14 1926 12/21/14 0437 12/21/14 1300 12/21/14 2113  WBC 8.6 7.6  --   --   NEUTROABS 6.4  --   --   --   HGB 8.9* 8.6* 8.1* 9.0*  HCT 29.4* 27.9* 26.5* 28.2*  MCV 72.2* 73.0*  --   --   PLT 301 252  --   --    Cardiac Enzymes: No results for input(s): CKTOTAL, CKMB, CKMBINDEX, TROPONINI in the last 168 hours. BNP (last 3 results)  Recent Labs  11/02/14 2340  BNP 109.2*    ProBNP (last 3 results) No results for input(s): PROBNP in the last 8760 hours.  CBG: No results for input(s): GLUCAP in the last 168 hours.  Recent Results (from the past 240 hour(s))  MRSA PCR Screening     Status: None   Collection Time: 12/20/14 10:39 PM  Result Value Ref Range Status   MRSA by PCR NEGATIVE NEGATIVE Final    Comment:        The GeneXpert MRSA Assay (FDA approved for NASAL specimens only), is one component of a comprehensive MRSA colonization surveillance program. It is not intended to diagnose MRSA infection nor to guide or monitor treatment for MRSA infections.      Studies: No results found.  Scheduled Meds: . atorvastatin  40 mg Oral Daily  . diclofenac sodium  4 g Topical QID  . metoprolol tartrate  12.5 mg Oral BID  . pantoprazole (PROTONIX) IV  40 mg Intravenous Q12H   Continuous Infusions: . sodium chloride 50 mL/hr at 12/22/14 0037  . sodium chloride      Principal Problem:   Lower GI bleed Active Problems:   Gout   Essential hypertension   Coronary atherosclerosis   Chronic renal  insufficiency, stage II (mild)   Hyperlipidemia with target LDL less than 100   Chronic diastolic CHF (congestive heart failure)   GI bleed   Acute blood loss anemia    Time spent: 30 min    Juliane Guest, [facility] Pager [phone]. If 7PM-7AM, please contact night-coverage at [email or web address], password [email or web address] 12/22/2014, 8:12 AM  LOS: 2 days

## 2014-12-22 NOTE — Op Note (Signed)
[facility] [place], [id number]   OPERATIVE PROCEDURE REPORT  PATIENT: Marissa [name], Marissa [name]  MR#: [id number] BIRTHDATE: [birth year] GENDER: female ENDOSCOPIST: Edmonia James, MD ASSISTANT:   Sharon Mt & Carolynn Comment, RN PROCEDURE DATE: 16-Jan-2015 PRE-PROCEDURE PREPARATION: Patient fasted for 4 hours prior to procedure. The patient was prepped with a gallon of Golytely the night prior to the procedure.  The patient has fasted for 4 hours prior to the procedure PRE-PROCEDURE PHYSICAL: Patient has stable vital signs. Neck is supple. There is no JVD, thyromegaly or LAD. Chest clear to auscultation.  S1 and S2 regular.  Abdomen soft, non-distended, non-tender with NABS. PROCEDURE:     Colonoscopy with control of bleeding. ASA CLASS:     Class III INDICATIONS:     1.  Rectal bleeding  2. Iron deficiency anemia  3. Colorectal cancer screening. MEDICATIONS:     Fentanyl 50 mcg  and Versed 4 mg IV  DESCRIPTION OF PROCEDURE: After the risks, benefits, and alternatives of the procedure were thoroughly explained [including a 10% missed rate of cancer and polyps], informed consent was obtained.  Digital rectal exam was performed.  The [employer] Adult colon [id number]  was introduced through the anus  and advanced to the terminal ileum which was intubated for a short distance , limited by No adverse events experienced.   The quality of the prep was fairly good . Multiple washes were done. Small lesions could be missed. The instrument was then slowly withdrawn as the colon was fully examined. Estimated blood loss is zero unless otherwise noted in this procedure report.     COLON FINDINGS: There was ebvidence of pandiverticulosis with inspissated stool in several of the diverticula. A bleeding diverticulum was noted at 40 cm in the sigmoid colon [there was dome fresh blood in the sigmoid colon that was washed off] and 2 resolution clips were  applied to achieve hemostasis. No masses, polyps  AVMs were noted. The appendiceal orifice and the ICV were identified and photographed. The terminal ileum appeared normal. Retroflexed views revealed no abnormalities. The patient tolerated the procedure without immediate complications.  The scope was then withdrawn from the patient and the procedure terminated.  TIME TO CECUM:   3 minutes 00 seconds WITHDRAW TIME:  9 minutes 00 seconds  IMPRESSION:     Pandiverticulosis with inspissated stool in several of the diverticula; one bleeding diverticulum in the sigmoid colon-2 resolutiion clips applied; tThe examined terminal ileum appeared to be normal  RECOMMENDATIONS:     1.  Hold Aspirin and all other NSAIDS for 2 weeks. 2.  Continue current medications. 3.  Continue surveillance. 4.  High fiber diet with liberal fluid intake. 5.  Repeat Colonscopy in 10 years.  REPEAT EXAM:      In 10 years  for a repeat colonoscopy.  If the patient has any abnormal GI symptoms in the interim, she have been advised to contact the office as soon as possible for further recommendations.   REFERRED BY: THP  eSigned:  Edmonia James, MD 01/16/15 4:08 PM   CPT CODES:     [id number] Colonoscopy, flexible, proximal to splenic flexure; with control of bleeding (eg, injection, bipolar cautery, unipolar cautery, laser, heater probe, stapler, plasma coagulator)  ICD CODES:     K 62.5 Rectal bleeding D50.9 Iron deficiency anemia Z12.11 Encounter for screening for malignant neoplasm of colon   The ICD and CPT codes recommended by this software are interpretations from  the data that the clinical staff has captured with the software.  The verification of the translation of this report to the ICD and CPT codes and modifiers is the sole responsibility of the health care institution and practicing physician where this report was generated.  [employer] will not be held responsible for the  validity of the ICD and CPT codes included on this report.  AMA assumes no liability for data contained or not contained herein. CPT is a Designer, television/film set of the [employer].  PATIENT NAME:  Marissa [name], Marissa [name] MR#: [id number]

## 2014-12-22 NOTE — Evaluation (Signed)
Physical Therapy Evaluation Patient Details Name: Marissa [name] MRN: [id number] DOB: [birth year] Today's Date: 12/22/2014   History of Present Illness  Marissa [name] is a 74 y.o. female with a history of CAD, Chronic Diastolic CHF, HTN, Stage II CKD, Diverticulosis, and Gout who presents to the ED with complaints of passing BRBPR x 10 since 5 AM. She reported having lower abdominal pain for 1 week prior to admission, right more than left sided and associated with dizziness and weakness. She denied nausea and vomiting and hematemesis and melena.She reported 2 previous episodes of diverticular bleeding, 7 and 3 years ago. In the ED, she was found to have an initial hemoglobin of 8.9, and her last known hemoglobin was 11.0. An FOBT was performed and was HEME positive, and she was found to be orthostatic. A transfusion was started in the ED.Despite blood transfusion hemoglobin trended down from 8.9-8.6.  Clinical Impression  Pt admitted with above diagnosis. Pt currently with functional limitations due to the deficits listed below (see PT Problem List). Pt was able to stand and is fairly steady.  States she has good and bad days at home.  She uses RW vs. Wheelchair.  States she needs new wheelchair.  Recommend HHPT to assess for new wheelchair as they can perform seating evaluation.  States she has 24 hour care and pt has all other needed equipment.  Pt will benefit from skilled PT to increase their independence and safety with mobility to allow discharge to the venue listed below.      Follow Up Recommendations Home health PT;Supervision/Assistance - 24 hour    Equipment Recommendations  Wheelchair (measurements PT);Wheelchair cushion (measurements PT) (Pt states her wheelchair is old.  Recommend new w/c)    Recommendations for Other Services       Precautions / Restrictions Precautions Precautions: Fall Restrictions Weight Bearing Restrictions: No      Mobility  Bed  Mobility Overal bed mobility: Needs Assistance Bed Mobility: Supine to Sit     Supine to sit: Supervision     General bed mobility comments: cues to sequencing   Transfers Overall transfer level: Needs assistance Equipment used: None Transfers: Sit to/from Stand Sit to Stand: Min assist         General transfer comment: cues for hand placement  Ambulation/Gait Ambulation/Gait assistance: Min guard Ambulation Distance (Feet): 3 Feet Assistive device: 2 person hand held assist     Gait velocity interpretation: Below normal speed for age/gender General Gait Details: Pt side stepped up to [facility].  Fairly steady on her feet.  Did not want to walk too far.  Colonscopy planned for pm and has been doing the golytely.    Stairs            Wheelchair Mobility    Modified Rankin (Stroke Patients Only)       Balance Overall balance assessment: Needs assistance;History of Falls         Standing balance support: Bilateral upper extremity supported;During functional activity Standing balance-Leahy Scale: Poor Standing balance comment: Needed steadying assist of HHA to stand statically.                               Pertinent Vitals/Pain Pain Assessment: Faces Faces Pain Scale: Hurts a little bit Pain Location: knees bil Pain Descriptors / Indicators: Aching Pain Intervention(s): Limited activity within patient's tolerance;Monitored during session;Repositioned  VSS    Home Living Family/patient expects to be discharged to::  Private residence Living Arrangements: Children Available Help at Discharge: Family;Available PRN/intermittently Type of Home: House Home Access: Stairs to enter Entrance Stairs-Rails: None Entrance Stairs-Number of Steps: 1 Home Layout: One level Home Equipment: Cane - single point;Walker - 2 wheels;Walker - 4 wheels;Bedside commode;Wheelchair - manual;Shower seat - built in Additional Comments: Pt states that family is there most  of time.      Prior Function Level of Independence: Needs assistance   Gait / Transfers Assistance Needed: States she uses cane most of the time but when she isnt' feeling good uses Rollator and at times even uses wheelchair to roll around in home (however reports wheelchair is old and not in good condition).  ADL's / Homemaking Assistance Needed: Had some assist per pt        Hand Dominance        Extremity/Trunk Assessment   Upper Extremity Assessment: Defer to OT evaluation           Lower Extremity Assessment: Generalized weakness      Cervical / Trunk Assessment: Kyphotic  Communication   Communication: No difficulties  Cognition Arousal/Alertness: Awake/alert Behavior During Therapy: Flat affect Overall Cognitive Status: Impaired/Different from baseline Area of Impairment: Safety/judgement;Problem solving         Safety/Judgement: Decreased awareness of safety   Problem Solving: Requires verbal cues;Slow processing;Decreased initiation      General Comments      Exercises        Assessment/Plan    PT Assessment Patient needs continued PT services  PT Diagnosis Generalized weakness   PT Problem List Decreased activity tolerance;Decreased balance;Decreased mobility;Decreased knowledge of use of DME;Decreased safety awareness  PT Treatment Interventions Gait training;Functional mobility training;Therapeutic activities;Therapeutic exercise;Balance training;Stair training;DME instruction;Patient/family education   PT Goals (Current goals can be found in the Care Plan section) Acute Rehab PT Goals Patient Stated Goal: to get better PT Goal Formulation: With patient Time For Goal Achievement: 01/05/15 Potential to Achieve Goals: Good    Frequency Min 3X/week   Barriers to discharge        Co-evaluation               End of Session Equipment Utilized During Treatment: Gait belt Activity Tolerance: Patient limited by fatigue Patient left:  in bed;with call bell/phone within reach Nurse Communication: Mobility status         Time: 1237-1258 PT Time Calculation (min) (ACUTE ONLY): 21 min   Charges:   PT Evaluation $Initial PT Evaluation Tier I: 1 Procedure     PT G CodesDenice [name] December 31, 2014, 3:30 PM [facility] [phone] (pager)

## 2014-12-23 ENCOUNTER — Encounter (HOSPITAL_COMMUNITY): Payer: Self-pay | Admitting: Gastroenterology

## 2014-12-23 DIAGNOSIS — M10072 Idiopathic gout, left ankle and foot: Secondary | ICD-10-CM

## 2014-12-23 LAB — BASIC METABOLIC PANEL
Anion gap: 6 (ref 5–15)
BUN: 11 mg/dL (ref 6–23)
CALCIUM: 8.6 mg/dL (ref 8.4–10.5)
CHLORIDE: 107 mmol/L (ref 96–112)
CO2: 26 mmol/L (ref 19–32)
Creatinine, Ser: 1.49 mg/dL — ABNORMAL HIGH (ref 0.50–1.10)
GFR calc Af Amer: 39 mL/min — ABNORMAL LOW (ref >90)
GFR, EST NON AFRICAN AMERICAN: 33 mL/min — AB (ref >90)
GLUCOSE: 95 mg/dL (ref 70–99)
Potassium: 3.7 mmol/L (ref 3.5–5.1)
Sodium: 139 mmol/L (ref 135–145)

## 2014-12-23 LAB — CBC
HEMATOCRIT: 23.6 % — AB (ref 36.0–46.0)
HEMOGLOBIN: 7.2 g/dL — AB (ref 12.0–15.0)
MCH: 22.4 pg — AB (ref 26.0–34.0)
MCHC: 30.5 g/dL (ref 30.0–36.0)
MCV: 73.3 fL — AB (ref 78.0–100.0)
PLATELETS: 268 10*3/uL (ref 150–400)
RBC: 3.22 MIL/uL — AB (ref 3.87–5.11)
RDW: 14.7 % (ref 11.5–15.5)
WBC: 8 10*3/uL (ref 4.0–10.5)

## 2014-12-23 LAB — PREPARE RBC (CROSSMATCH)

## 2014-12-23 MED ORDER — HYDROMORPHONE HCL 1 MG/ML IJ SOLN
0.5000 mg | Freq: Once | INTRAMUSCULAR | Status: DC
  Filled 2014-12-23: qty 1

## 2014-12-23 MED ORDER — COLCHICINE 0.6 MG PO TABS
1.2000 mg | ORAL_TABLET | Freq: Once | ORAL | Status: AC
  Administered 2014-12-23: 1.2 mg via ORAL
  Filled 2014-12-23: qty 2

## 2014-12-23 MED ORDER — COLCHICINE 0.6 MG PO TABS
0.6000 mg | ORAL_TABLET | Freq: Every day | ORAL | Status: DC
  Administered 2014-12-23: 0.6 mg via ORAL
  Filled 2014-12-23: qty 1

## 2014-12-23 MED ORDER — SODIUM CHLORIDE 0.9 % IV SOLN
Freq: Once | INTRAVENOUS | Status: AC
  Administered 2014-12-23: 08:00:00 via INTRAVENOUS

## 2014-12-23 MED ORDER — OXYCODONE HCL 5 MG PO TABS
5.0000 mg | ORAL_TABLET | ORAL | Status: DC | PRN
  Administered 2014-12-24: 5 mg via ORAL
  Filled 2014-12-23: qty 1

## 2014-12-23 MED ORDER — METOPROLOL TARTRATE 25 MG PO TABS
25.0000 mg | ORAL_TABLET | Freq: Two times a day (BID) | ORAL | Status: DC
  Administered 2014-12-23 – 2014-12-25 (×5): 25 mg via ORAL
  Filled 2014-12-23 (×5): qty 1

## 2014-12-23 MED ORDER — HYDRALAZINE HCL 20 MG/ML IJ SOLN: 10.0000 mg | INTRAMUSCULAR | Status: DC | PRN

## 2014-12-23 NOTE — Progress Notes (Signed)
PT Cancellation Note  Patient Details Name: Marissa [name] MRN: [id number] DOB: May 25, 1941   Cancelled Treatment:    Reason Eval/Treat Not Completed: Medical issues which prohibited therapy (Pt receiving blood and nursing states to hold this am.  )Will return as able.  Thanks.   Irwin Brakeman F 12/23/2014, 11:14 AM  [facility] [phone] (pager)

## 2014-12-23 NOTE — Progress Notes (Signed)
[facility] PROGRESS NOTE  Marissa [name] DXI:[id number] DOB: [birth year] DOA: 12/20/2014 PCP: [name], MD  Brief Summary  Marissa [name] is a 74 y.o. female with a history of CAD, Chronic Diastolic CHF, HTN, Stage II CKD, Diverticulosis, and Gout who presents to the ED with complaints of passing BRBPR x 10 since 5 AM. She reported having lower abdominal pain for 1 week prior to admission, right more than left sided and associated with dizziness and weakness. She denied nausea and vomiting and hematemesis and melena.She reported 2 previous episodes of diverticular bleeding, 7 and 3 years ago. In the ED, she was found to have an initial hemoglobin of 8.9, and her last known hemoglobin was 11.0. An FOBT was performed and was HEME positive, and she was found to be orthostatic. She was given 1 unit blood transfusion on the day of admission.  Colonoscopy demonstrated diverticulosis Assessment/Plan  Acute lower GI bleed likely secondary to diverticulosis with inspissated stool and one bleeding diverticulum in the sigmoid colon which was treated with 2 clips.   -  Hold ASA and NSAIDS for two weeks -  High fiber diet and liberal fluids -  Repeat colonoscopy in 10 years -  Telemetry:  normal sinus rhythm -  Appreciate Gastroenterology assistance  Acute blood loss anemia superimposed on microcytic anemia -  Iron studies, B12, folate pending -  TSH 2.4 -  Transfuse 1 unit PRBC today -  Will need iron supplementation at discharge  GERD, stable, continue Protonix  CAD, chest pain free -  Holding aspirin due to GI bleed -  Started beta blocker -  Increased statin to high-dose  Chronic diastolic heart failure, appears euvolemic -  Daily weights -  Once diet advanced, low sodium diet  Essential hypertension/hyperlipidemia, blood pressure wnl to elevated -  increase beta blocker -  Treat pain -  Increased statin secondary to CAD  CKD stage II, creatinine at baseline of  1.4-1.5 -  Minimize nephrotoxins and renally dose medications   Gout/Arthritis, gout flare in feet -  Held NSAIDS and prednisone due to recent bleeding -  Resume colchicine  -  Continue diclofenac gel -  May need to resume some prednisone  Diet:   CLD > healthy heart Access:   PIV IVF:   off Proph:   SCDs  Code Status:  full code Family Communication:  patient alone Disposition Plan:  pending improvement in joint pains and blood transfusion today.  If pain in feet somewhat better and hemoglobin stable to improved tomorrow, plan to dispo home with [facility] services.   Consultants:   gastroenterology  Procedures:   none  Antibiotics:   none   HPI/Subjective:  Abdominal pain has returned and she has severe pain in both feet.  Brown, nonbloody stool this morning  Objective: Filed Vitals:   12/23/14 0751 12/23/14 0820 12/23/14 0832 12/23/14 0847  BP: 153/83 150/70  143/65  Pulse: 83 84 81 80  Temp: 98.3 F (36.8 C) 98.5 F (36.9 C)  98.1 F (36.7 C)  TempSrc: Oral Oral  Oral  Resp: 24 14 13 15   Height:      Weight:      SpO2: 100% 94% 100% 100%    Intake/Output Summary (Last 24 hours) at 12/23/14 0902 Last data filed at 12/23/14 0832  Gross per 24 hour  Intake    885 ml  Output    400 ml  Net    485 ml   Filed Weights   12/20/14 1918  12/20/14 1923 12/20/14 2320  Weight: 102.967 kg (227 lb) 102.967 kg (227 lb) 102.9 kg (226 lb 13.7 oz)    Exam:   General:   Obese female,No acute distress  HEENT:  NCAT, MMM  Cardiovascular:  RRR, nl S1, S2 no mrg, 2+ pulses, warm extremities  Respiratory:  CTAB, no increased WOB  Abdomen:   NABS, soft, nondistended, TTP in epigastrium without rebound or guarding  MSK:   Normal tone and bulk, warmth and swelling with exquisite tenderness to palpation along the left foot navicular line, 1st MTP joint bilaterally, 1+ pitting and nonpitting edema around bilateral ankles, Grossly intact.    Data Reviewed: Basic Metabolic  Panel:  Recent Labs Lab 12/20/14 1926 12/21/14 0437 12/22/14 0800 12/23/14 0410  NA 139 138 138 139  K 3.7 3.9 3.9 3.7  CL 106 108 106 107  CO2 25 19 18* 26  GLUCOSE 122* 96 138* 95  BUN 19 18 13 11   CREATININE 1.55* 1.38* 1.53* 1.49*  CALCIUM 9.1 8.6 9.2 8.6   Liver Function Tests:  Recent Labs Lab 12/20/14 1926  AST 19  ALT 11  ALKPHOS 82  BILITOT 0.4  PROT 6.7  ALBUMIN 3.2*   No results for input(s): LIPASE, AMYLASE in the last 168 hours. No results for input(s): AMMONIA in the last 168 hours. CBC:  Recent Labs Lab 12/20/14 1926 12/21/14 0437 12/21/14 1300 12/21/14 2113 12/22/14 0800 12/23/14 0410  WBC 8.6 7.6  --   --   --  8.0  NEUTROABS 6.4  --   --   --   --   --   HGB 8.9* 8.6* 8.1* 9.0* 9.1* 7.2*  HCT 29.4* 27.9* 26.5* 28.2* 31.0* 23.6*  MCV 72.2* 73.0*  --   --   --  73.3*  PLT 301 252  --   --   --  268   Cardiac Enzymes: No results for input(s): CKTOTAL, CKMB, CKMBINDEX, TROPONINI in the last 168 hours. BNP (last 3 results)  Recent Labs  11/02/14 2340  BNP 109.2*    ProBNP (last 3 results) No results for input(s): PROBNP in the last 8760 hours.  CBG: No results for input(s): GLUCAP in the last 168 hours.  Recent Results (from the past 240 hour(s))  MRSA PCR Screening     Status: None   Collection Time: 12/20/14 10:39 PM  Result Value Ref Range Status   MRSA by PCR NEGATIVE NEGATIVE Final    Comment:        The GeneXpert MRSA Assay (FDA approved for NASAL specimens only), is one component of a comprehensive MRSA colonization surveillance program. It is not intended to diagnose MRSA infection nor to guide or monitor treatment for MRSA infections.      Studies: No results found.  Scheduled Meds: . sodium chloride   Intravenous Once  . atorvastatin  40 mg Oral Daily  . diclofenac sodium  4 g Topical QID  . metoprolol tartrate  12.5 mg Oral BID  . pantoprazole (PROTONIX) IV  40 mg Intravenous Q12H   Continuous  Infusions: . sodium chloride 50 mL/hr at 12/23/14 0238  . sodium chloride      Principal Problem:   Lower GI bleed Active Problems:   Gout   Essential hypertension   Coronary atherosclerosis   Chronic renal insufficiency, stage II (mild)   Hyperlipidemia with target LDL less than 100   Chronic diastolic CHF (congestive heart failure)   GI bleed   Acute blood loss anemia  Time spent: 30 min    Chan Rosasco, [place] [facility] Pager [phone]. If 7PM-7AM, please contact night-coverage at [email or web address], password [facility] 12/23/2014, 9:02 AM  LOS: 3 days

## 2014-12-24 DIAGNOSIS — K5731 Diverticulosis of large intestine without perforation or abscess with bleeding: Principal | ICD-10-CM

## 2014-12-24 LAB — TYPE AND SCREEN
ABO/RH(D): B POS
Antibody Screen: NEGATIVE
Unit division: 0
Unit division: 0

## 2014-12-24 LAB — BASIC METABOLIC PANEL
ANION GAP: 8 (ref 5–15)
BUN: 12 mg/dL (ref 6–23)
CALCIUM: 8.9 mg/dL (ref 8.4–10.5)
CO2: 22 mmol/L (ref 19–32)
CREATININE: 1.58 mg/dL — AB (ref 0.50–1.10)
Chloride: 108 mmol/L (ref 96–112)
GFR calc Af Amer: 36 mL/min — ABNORMAL LOW (ref >90)
GFR, EST NON AFRICAN AMERICAN: 31 mL/min — AB (ref >90)
Glucose, Bld: 94 mg/dL (ref 70–99)
Potassium: 4.1 mmol/L (ref 3.5–5.1)
Sodium: 138 mmol/L (ref 135–145)

## 2014-12-24 LAB — CBC
HCT: 27.5 % — ABNORMAL LOW (ref 36.0–46.0)
HEMOGLOBIN: 8.3 g/dL — AB (ref 12.0–15.0)
MCH: 22.9 pg — ABNORMAL LOW (ref 26.0–34.0)
MCHC: 30.2 g/dL (ref 30.0–36.0)
MCV: 75.8 fL — ABNORMAL LOW (ref 78.0–100.0)
Platelets: 198 10*3/uL (ref 150–400)
RBC: 3.63 MIL/uL — AB (ref 3.87–5.11)
RDW: 14.9 % (ref 11.5–15.5)
WBC: 9.5 10*3/uL (ref 4.0–10.5)

## 2014-12-24 MED ORDER — PANTOPRAZOLE SODIUM 40 MG PO TBEC
40.0000 mg | DELAYED_RELEASE_TABLET | Freq: Every day | ORAL | Status: DC
  Administered 2014-12-24 – 2014-12-25 (×2): 40 mg via ORAL
  Filled 2014-12-24: qty 1

## 2014-12-24 MED ORDER — COLCHICINE 0.6 MG PO TABS: 0.6000 mg | ORAL_TABLET | Freq: Two times a day (BID) | ORAL | Status: DC

## 2014-12-24 MED ORDER — METHYLPREDNISOLONE SODIUM SUCC 125 MG IJ SOLR: 60.0000 mg | Freq: Once | INTRAMUSCULAR | Status: DC

## 2014-12-24 MED ORDER — PREDNISONE 10 MG PO TABS
60.0000 mg | ORAL_TABLET | Freq: Once | ORAL | Status: AC
Start: 2014-12-24 — End: 2014-12-24
  Administered 2014-12-24: 60 mg via ORAL
  Filled 2014-12-24 (×2): qty 1

## 2014-12-24 MED ORDER — COLCHICINE 0.6 MG PO TABS
0.6000 mg | ORAL_TABLET | Freq: Two times a day (BID) | ORAL | Status: DC
  Administered 2014-12-24: 0.6 mg via ORAL

## 2014-12-24 MED ORDER — COLCHICINE 0.6 MG PO TABS
0.6000 mg | ORAL_TABLET | Freq: Once | ORAL | Status: AC
  Administered 2014-12-24: 0.6 mg via ORAL
  Filled 2014-12-24: qty 1

## 2014-12-24 MED ORDER — PREDNISONE 50 MG PO TABS
60.0000 mg | ORAL_TABLET | Freq: Every day | ORAL | Status: DC
  Administered 2014-12-25: 60 mg via ORAL
  Filled 2014-12-24 (×2): qty 1

## 2014-12-24 NOTE — Progress Notes (Signed)
UR completed.  Yomaris Palecek, RN BSN MHA CCM Trauma/Neuro ICU Case Manager [phone]  

## 2014-12-24 NOTE — Progress Notes (Signed)
Physical Therapy Treatment Patient Details Name: Marissa [name] MRN: [id number] DOB: [birth year] Today's Date: 12/24/2014    History of Present Illness Marissa [name] is a 74 y.o. female with a history of CAD, Chronic Diastolic CHF, HTN, Stage II CKD, Diverticulosis, and Gout who presents to the ED with complaints of passing BRBPR x 10 since 5 AM. She reported having lower abdominal pain for 1 week prior to admission, right more than left sided and associated with dizziness and weakness. She denied nausea and vomiting and hematemesis and melena.She reported 2 previous episodes of diverticular bleeding, 7 and 3 years ago. In the ED, she was found to have an initial hemoglobin of 8.9, and her last known hemoglobin was 11.0. An FOBT was performed and was HEME positive, and she was found to be orthostatic. A transfusion was started in the ED. GI Dr. Benson Norway was consulted by the EDP Dr. Darl Householder.Despite blood transfusion hemoglobin trended down from 8.9-8.6. She completed prep for colonoscopy. She states that initially her stools were red tinged but she thinks her more recent bowel movements having clear .    PT Comments    Patient seen for mobility. Patient performed minimal ambulation OOB to chair. Declining further ambulation at this time. During session patient demonstrates ability to come to EOB as well as perform adequate mobility for discharge home with w/c and assist. Daughter to confirm that assist will be provided. Will follow up as indicated.   Follow Up Recommendations  Home health PT;Supervision/Assistance - 24 hour     Equipment Recommendations  Wheelchair (measurements PT);Wheelchair cushion (measurements PT) (Pt states her wheelchair is old.  Recommend new w/c)    Recommendations for Other Services       Precautions / Restrictions Precautions Precautions: Fall Restrictions Weight Bearing Restrictions: No    Mobility  Bed Mobility Overal bed mobility: Needs  Assistance Bed Mobility: Supine to Sit     Supine to sit: Supervision     General bed mobility comments: cues to sequencing   Transfers Overall transfer level: Needs assistance Equipment used: None Transfers: Sit to/from Stand Sit to Stand: Min assist         General transfer comment: cues for hand placement  Ambulation/Gait Ambulation/Gait assistance: Min guard Ambulation Distance (Feet): 5 Feet Assistive device: 1 person hand held assist (refusing walker use) Gait Pattern/deviations: Step-to pattern;Trunk flexed   Gait velocity interpretation: Below normal speed for age/gender General Gait Details: patient minimal steps to chair, declined all further mobility   Stairs            Wheelchair Mobility    Modified Rankin (Stroke Patients Only)       Balance             Standing balance-Leahy Scale: Poor                      Cognition Arousal/Alertness: Awake/alert Behavior During Therapy: Flat affect Overall Cognitive Status: Impaired/Different from baseline Area of Impairment: Safety/judgement;Problem solving         Safety/Judgement: Decreased awareness of safety   Problem Solving: Requires verbal cues;Slow processing;Decreased initiation General Comments: Patient requires encouragement to participate    Exercises      General Comments        Pertinent Vitals/Pain Pain Assessment: Faces Faces Pain Scale: Hurts a little bit Pain Location: feet Pain Descriptors / Indicators: Discomfort (gout pain) Pain Intervention(s): Limited activity within patient's tolerance;Monitored during session    Home Living  Prior Function            PT Goals (current goals can now be found in the care plan section) Acute Rehab PT Goals Patient Stated Goal: to get better PT Goal Formulation: With patient Time For Goal Achievement: 01/05/15 Potential to Achieve Goals: Good Progress towards PT goals: Progressing  toward goals    Frequency  Min 3X/week    PT Plan Current plan remains appropriate    Co-evaluation             End of Session Equipment Utilized During Treatment: Gait belt Activity Tolerance: Patient limited by fatigue Patient left: in chair;with call bell/phone within reach;with family/visitor present     Time: 9798-9211 PT Time Calculation (min) (ACUTE ONLY): 15 min  Charges:  $Therapeutic Activity: 8-22 mins                    G CodesDuncan Dull Jan 14, 2015, 5:13 PM Alben Deeds, [place] DPT  [phone]

## 2014-12-24 NOTE — Progress Notes (Signed)
[facility] PROGRESS NOTE  Marissa [name] YOV:[id number] DOB: June 06, 1941 DOA: 12/20/2014 PCP: [name], MD  Brief Summary  Marissa [name] is a 74 y.o. female with a history of CAD, Chronic Diastolic CHF, HTN, Stage II CKD, Diverticulosis, and Gout who presents to the ED with complaints of passing BRBPR x 10 since 5 AM. She reported having lower abdominal pain for 1 week prior to admission, right more than left sided and associated with dizziness and weakness. She denied nausea and vomiting and hematemesis and melena.She reported 2 previous episodes of diverticular bleeding, 7 and 3 years ago. In the ED, she was found to have an initial hemoglobin of 8.9, and her last known hemoglobin was 11.0. An FOBT was performed and was HEME positive, and she was found to be orthostatic. She was given 1 unit blood transfusion on the day of admission.  Colonoscopy demonstrated diverticulosis Assessment/Plan  Acute lower GI bleed likely secondary to diverticulosis with inspissated stool and one bleeding diverticulum in the sigmoid colon which was treated with 2 clips.   -  Hold ASA and NSAIDS for two weeks -  High fiber diet and liberal fluids -  Repeat colonoscopy in 10 years -  Telemetry:  normal sinus rhythm Hemoglobin stable   Acute blood loss anemia superimposed on microcytic anemia -  Iron studies, B12, folate still pending -  TSH 2.4 -  Transfuse 1 unit PRBC today -  Will need iron supplementation at discharge  GERD, stable, continue Protonix  CAD, chest pain free -  Holding aspirin due to GI bleed -  Started beta blocker -  Increased statin to high-dose  Chronic diastolic heart failure, appears euvolemic -  Daily weights -  Once diet advanced, low sodium diet  Essential hypertension/hyperlipidemia, blood pressure wnl to elevated -  increase beta blocker -  Treat pain -  Increased statin secondary to CAD  CKD stage II, creatinine at baseline of 1.4-1.5 -  Minimize  nephrotoxins and renally dose medications  Creatinine at baseline  Gout/Arthritis, gout flare in feet -  Held NSAIDS and prednisone due to recent bleeding Increase colchicine to 0.6 mg by mouth twice a day Give 1 dose of Solu-Medrol Given chronic kidney disease indomethacin is contraindicated -  Continue diclofenac gel Patient on by mouth prednisone at home  Diet:   CLD > healthy heart Access:   PIV IVF:   off Proph:   SCDs  Code Status:  full code Family Communication:  patient alone Disposition Plan:  pending improvement in joint pains and blood transfusion today.  Anticipate discharge tomorrow with home health   Consultants:   gastroenterology  Procedures:   none  Antibiotics:   none   HPI/Subjective:  Unable to stand and ambulate because of pain in her left toe  Objective: Filed Vitals:   12/23/14 1241 12/23/14 1415 12/23/14 2116 12/24/14 0545  BP: 154/93 147/82 153/81 129/62  Pulse: 76 72 85 72  Temp: 97.9 F (36.6 C) 97.3 F (36.3 C) 99.4 F (37.4 C) 97.9 F (36.6 C)  TempSrc: Oral Oral Oral   Resp: 16 16 18 17   Height:      Weight:      SpO2: 98% 99% 99% 100%    Intake/Output Summary (Last 24 hours) at 12/24/14 1002 Last data filed at 12/24/14 0845  Gross per 24 hour  Intake    120 ml  Output      0 ml  Net    120 ml   [employer]  12/20/14 1918 12/20/14 1923 12/20/14 2320  Weight: 102.967 kg (227 lb) 102.967 kg (227 lb) 102.9 kg (226 lb 13.7 oz)    Exam:   General:   Obese female,No acute distress  HEENT:  NCAT, MMM  Cardiovascular:  RRR, nl S1, S2 no mrg, 2+ pulses, warm extremities  Respiratory:  CTAB, no increased WOB  Abdomen:   NABS, soft, nondistended, TTP in epigastrium without rebound or guarding  MSK:   Normal tone and bulk, warmth and swelling with exquisite tenderness to palpation along the left foot navicular line, 1st MTP joint bilaterally, 1+ pitting and nonpitting edema around bilateral ankles, Grossly intact.     Data Reviewed: Basic Metabolic Panel:  Recent Labs Lab 12/20/14 1926 12/21/14 0437 12/22/14 0800 12/23/14 0410 12/24/14 0418  NA 139 138 138 139 138  K 3.7 3.9 3.9 3.7 4.1  CL 106 108 106 107 108  CO2 25 19 18* 26 22  GLUCOSE 122* 96 138* 95 94  BUN 19 18 13 11 12   CREATININE 1.55* 1.38* 1.53* 1.49* 1.58*  CALCIUM 9.1 8.6 9.2 8.6 8.9   Liver Function Tests:  Recent Labs Lab 12/20/14 1926  AST 19  ALT 11  ALKPHOS 82  BILITOT 0.4  PROT 6.7  ALBUMIN 3.2*   No results for input(s): LIPASE, AMYLASE in the last 168 hours. No results for input(s): AMMONIA in the last 168 hours. CBC:  Recent Labs Lab 12/20/14 1926 12/21/14 0437 12/21/14 1300 12/21/14 2113 12/22/14 0800 12/23/14 0410 12/24/14 0418  WBC 8.6 7.6  --   --   --  8.0 9.5  NEUTROABS 6.4  --   --   --   --   --   --   HGB 8.9* 8.6* 8.1* 9.0* 9.1* 7.2* 8.3*  HCT 29.4* 27.9* 26.5* 28.2* 31.0* 23.6* 27.5*  MCV 72.2* 73.0*  --   --   --  73.3* 75.8*  PLT 301 252  --   --   --  268 198   Cardiac Enzymes: No results for input(s): CKTOTAL, CKMB, CKMBINDEX, TROPONINI in the last 168 hours. BNP (last 3 results)  Recent Labs  11/02/14 2340  BNP 109.2*    ProBNP (last 3 results) No results for input(s): PROBNP in the last 8760 hours.  CBG: No results for input(s): GLUCAP in the last 168 hours.  Recent Results (from the past 240 hour(s))  MRSA PCR Screening     Status: None   Collection Time: 12/20/14 10:39 PM  Result Value Ref Range Status   MRSA by PCR NEGATIVE NEGATIVE Final    Comment:        The GeneXpert MRSA Assay (FDA approved for NASAL specimens only), is one component of a comprehensive MRSA colonization surveillance program. It is not intended to diagnose MRSA infection nor to guide or monitor treatment for MRSA infections.      Studies: No results found.  Scheduled Meds: . atorvastatin  40 mg Oral Daily  . colchicine  0.6 mg Oral BID  . diclofenac sodium  4 g Topical QID   .  HYDROmorphone (DILAUDID) injection  0.5 mg Intravenous Once  . methylPREDNISolone (SOLU-MEDROL) injection  60 mg Intravenous Once  . metoprolol tartrate  25 mg Oral BID   Continuous Infusions:    Principal Problem:   Diverticulosis of colon with hemorrhage Active Problems:   Gout   Essential hypertension   Coronary atherosclerosis   Chronic kidney disease, stage 3   Hyperlipidemia with target LDL less  than 100   Acute gouty arthritis   Chronic diastolic CHF (congestive heart failure)   GI bleed   Acute blood loss anemia    Time spent: 30 min    [facility]  [facility] Pager [phone] If 7PM-7AM, please contact night-coverage at [email or web address], password [facility] 12/24/2014, 10:02 AM  LOS: 4 days

## 2014-12-25 ENCOUNTER — Telehealth: Payer: Self-pay | Admitting: *Deleted

## 2014-12-25 LAB — CBC
HCT: 27.7 % — ABNORMAL LOW (ref 36.0–46.0)
Hemoglobin: 8.6 g/dL — ABNORMAL LOW (ref 12.0–15.0)
MCH: 23.6 pg — ABNORMAL LOW (ref 26.0–34.0)
MCHC: 31 g/dL (ref 30.0–36.0)
MCV: 75.9 fL — ABNORMAL LOW (ref 78.0–100.0)
PLATELETS: 210 10*3/uL (ref 150–400)
RBC: 3.65 MIL/uL — ABNORMAL LOW (ref 3.87–5.11)
RDW: 14.7 % (ref 11.5–15.5)
WBC: 6.2 10*3/uL (ref 4.0–10.5)

## 2014-12-25 LAB — COMPREHENSIVE METABOLIC PANEL
ALT: 11 U/L (ref 0–35)
AST: 16 U/L (ref 0–37)
Albumin: 3 g/dL — ABNORMAL LOW (ref 3.5–5.2)
Alkaline Phosphatase: 77 U/L (ref 39–117)
Anion gap: 11 (ref 5–15)
BUN: 17 mg/dL (ref 6–23)
CHLORIDE: 108 mmol/L (ref 96–112)
CO2: 21 mmol/L (ref 19–32)
Calcium: 9.3 mg/dL (ref 8.4–10.5)
Creatinine, Ser: 1.37 mg/dL — ABNORMAL HIGH (ref 0.50–1.10)
GFR calc Af Amer: 43 mL/min — ABNORMAL LOW (ref >90)
GFR, EST NON AFRICAN AMERICAN: 37 mL/min — AB (ref >90)
Glucose, Bld: 131 mg/dL — ABNORMAL HIGH (ref 70–99)
Potassium: 4.1 mmol/L (ref 3.5–5.1)
SODIUM: 140 mmol/L (ref 135–145)
Total Bilirubin: 0.5 mg/dL (ref 0.3–1.2)
Total Protein: 7 g/dL (ref 6.0–8.3)

## 2014-12-25 MED ORDER — METOPROLOL TARTRATE 25 MG PO TABS: 25.0000 mg | ORAL_TABLET | Freq: Every morning | ORAL | Status: DC

## 2014-12-25 MED ORDER — DICLOFENAC SODIUM 1 % TD GEL: 4.0000 g | Freq: Four times a day (QID) | TRANSDERMAL | Status: DC

## 2014-12-25 MED ORDER — COLCHICINE 0.6 MG PO TABS: 0.6000 mg | ORAL_TABLET | Freq: Two times a day (BID) | ORAL | Status: DC

## 2014-12-25 MED ORDER — ATORVASTATIN CALCIUM 40 MG PO TABS: 40.0000 mg | ORAL_TABLET | Freq: Every day | ORAL | Status: DC

## 2014-12-25 MED ORDER — PREDNISONE 10 MG PO TABS
40.0000 mg | ORAL_TABLET | Freq: Every day | ORAL | Status: DC
Start: 2014-12-25 — End: 2014-12-29

## 2014-12-25 MED ORDER — PANTOPRAZOLE SODIUM 40 MG PO TBEC: 40.0000 mg | DELAYED_RELEASE_TABLET | Freq: Every day | ORAL | Status: DC

## 2014-12-25 MED ORDER — OLMESARTAN MEDOXOMIL 20 MG PO TABS: 20.0000 mg | ORAL_TABLET | Freq: Every day | ORAL | Status: DC

## 2014-12-25 MED ORDER — IRON 325 (65 FE) MG PO TABS: 325.0000 mg | ORAL_TABLET | Freq: Two times a day (BID) | ORAL | Status: DC

## 2014-12-25 NOTE — Progress Notes (Signed)
Ambulated patient in the halls. She ambulated 1 complete lap around the entire unit requiring very minimal assistance. She walked with and without the walker, carrying it at one point to demonstrate she do not have to depend on a walker. She admits to walking with a cane occasionally at home. She displayed minimal heavy breathing when nearing her room but she recovered well.

## 2014-12-25 NOTE — Care Management Note (Signed)
  Page 1 of 1   12/25/2014     11:15:33 AM CARE MANAGEMENT NOTE 12/25/2014  Patient:  Marissa [name], Marissa [name]   Account Number:  [id number]  Date Initiated:  12/24/2014  Documentation initiated by:  Sandi Mariscal  Subjective/Objective Assessment:   GIB-hematemesis and melena     Action/Plan:   await stability to determine d/c needs   Anticipated DC Date:  12/25/2014   Anticipated DC Plan:  [place]         Choice offered to / List presented to:  C-1 Patient   DME arranged  [place]      DME agency  [place] arranged  [place]   Status of service:  Completed, signed off Medicare Important Message given?  YES (If response is "NO", the following Medicare IM given date fields will be blank) Date Medicare IM given:  12/25/2014 Medicare IM given by:  Magdalen Spatz Date Additional Medicare IM given:   Additional Medicare IM given by:    Discharge Disposition:    Per UR Regulation:  Reviewed for med. necessity/level of care/duration of stay  If discussed at [place] of Stay Meetings, dates discussed:   12/25/2014    Comments:

## 2014-12-25 NOTE — Discharge Summary (Signed)
Physician Discharge Summary  Marissa [name] MRN: [id number] DOB/AGE: 16-May-1941 74 y.o.  PCP: [name], MD   Admit date: 12/20/2014 Discharge date: 12/25/2014  Discharge Diagnoses:     Principal Problem:   Diverticulosis of colon with hemorrhage Active Problems: Acute gout flare   Essential hypertension   Coronary atherosclerosis   Chronic kidney disease, stage 3   Hyperlipidemia with target LDL less than 100   Acute gouty arthritis   Chronic diastolic CHF (congestive heart failure)   GI bleed   Acute blood loss anemia  Follow-up recommendations Follow-up with PCP in 5-7 days Follow-up CBC, CMP in 1 week     Medication List    STOP taking these medications        aspirin EC 81 MG tablet     cephALEXin 500 MG capsule  Commonly known as:  KEFLEX     erythromycin ophthalmic ointment     indomethacin 50 MG capsule  Commonly known as:  INDOCIN     olmesartan-hydrochlorothiazide 20-12.5 MG per tablet  Commonly known as:  BENICAR HCT      TAKE these medications        amLODipine 10 MG tablet  Commonly known as:  NORVASC  Take 1 tablet (10 mg total) by mouth daily.     atorvastatin 40 MG tablet  Commonly known as:  LIPITOR  Take 1 tablet (40 mg total) by mouth daily.     colchicine 0.6 MG tablet  Take 1 tablet (0.6 mg total) by mouth 2 (two) times daily.     diclofenac sodium 1 % Gel  Commonly known as:  VOLTAREN  Apply 4 g topically 4 (four) times daily.     furosemide 20 MG tablet  Commonly known as:  LASIX  Take 20 mg by mouth daily as needed for fluid.     HYDROcodone-acetaminophen 5-325 MG per tablet  Commonly known as:  NORCO/VICODIN  Take 2 tablets by mouth every 4 (four) hours as needed for moderate pain or severe pain.     metoprolol tartrate 25 MG tablet  Commonly known as:  LOPRESSOR  Take 1 tablet (25 mg total) by mouth every morning.     nitroGLYCERIN 0.4 MG SL tablet  Commonly known as:  NITROSTAT  Place 1 tablet (0.4 mg  total) under the tongue every 5 (five) minutes as needed for chest pain.     olmesartan 20 MG tablet  Commonly known as:  BENICAR  Take 1 tablet (20 mg total) by mouth daily.     pantoprazole 40 MG tablet  Commonly known as:  PROTONIX  Take 1 tablet (40 mg total) by mouth daily.     predniSONE 10 MG tablet  Commonly known as:  DELTASONE  Take 4 tablets (40 mg total) by mouth daily with breakfast.        Discharge Condition: Stable   Disposition: 01-Home or Self Care   Consults:  Gastroenterology  Significant Diagnostic Studies: No results found.    Microbiology: Recent Results (from the past 240 hour(s))  MRSA PCR Screening     Status: None   Collection Time: 12/20/14 10:39 PM  Result Value Ref Range Status   MRSA by PCR NEGATIVE NEGATIVE Final    Comment:        The GeneXpert MRSA Assay (FDA approved for NASAL specimens only), is one component of a comprehensive MRSA colonization surveillance program. It is not intended to diagnose MRSA infection nor to guide or monitor treatment for  MRSA infections.      Labs: Results for orders placed or performed during the hospital encounter of 12/20/14 (from the past 48 hour(s))  Basic metabolic panel     Status: Abnormal   Collection Time: 12/24/14  4:18 AM  Result Value Ref Range   Sodium 138 135 - 145 mmol/L   Potassium 4.1 3.5 - 5.1 mmol/L   Chloride 108 96 - 112 mmol/L   CO2 22 19 - 32 mmol/L   Glucose, Bld 94 70 - 99 mg/dL   BUN 12 6 - 23 mg/dL   Creatinine, Ser 1.58 (H) 0.50 - 1.10 mg/dL   Calcium 8.9 8.4 - 10.5 mg/dL   GFR calc non Af Amer 31 (L) >90 mL/min   GFR calc Af Amer 36 (L) >90 mL/min    Comment: (NOTE) The eGFR has been calculated using the CKD EPI equation. This calculation has not been validated in all clinical situations. eGFR's persistently <90 mL/min signify possible Chronic Kidney Disease.    Anion gap 8 5 - 15  CBC     Status: Abnormal   Collection Time: 12/24/14  4:18 AM  Result  Value Ref Range   WBC 9.5 4.0 - 10.5 K/uL   RBC 3.63 (L) 3.87 - 5.11 MIL/uL   Hemoglobin 8.3 (L) 12.0 - 15.0 g/dL   HCT 27.5 (L) 36.0 - 46.0 %   MCV 75.8 (L) 78.0 - 100.0 fL   MCH 22.9 (L) 26.0 - 34.0 pg   MCHC 30.2 30.0 - 36.0 g/dL   RDW 14.9 11.5 - 15.5 %   Platelets 198 150 - 400 K/uL    Comment: DELTA CHECK NOTED REPEATED TO VERIFY SPECIMEN CHECKED FOR CLOTS   CBC     Status: Abnormal   Collection Time: 12/25/14  4:34 AM  Result Value Ref Range   WBC 6.2 4.0 - 10.5 K/uL   RBC 3.65 (L) 3.87 - 5.11 MIL/uL   Hemoglobin 8.6 (L) 12.0 - 15.0 g/dL   HCT 27.7 (L) 36.0 - 46.0 %   MCV 75.9 (L) 78.0 - 100.0 fL   MCH 23.6 (L) 26.0 - 34.0 pg   MCHC 31.0 30.0 - 36.0 g/dL   RDW 14.7 11.5 - 15.5 %   Platelets 210 150 - 400 K/uL  Comprehensive metabolic panel     Status: Abnormal   Collection Time: 12/25/14  4:34 AM  Result Value Ref Range   Sodium 140 135 - 145 mmol/L   Potassium 4.1 3.5 - 5.1 mmol/L   Chloride 108 96 - 112 mmol/L   CO2 21 19 - 32 mmol/L   Glucose, Bld 131 (H) 70 - 99 mg/dL   BUN 17 6 - 23 mg/dL   Creatinine, Ser 1.37 (H) 0.50 - 1.10 mg/dL   Calcium 9.3 8.4 - 10.5 mg/dL   Total Protein 7.0 6.0 - 8.3 g/dL   Albumin 3.0 (L) 3.5 - 5.2 g/dL   AST 16 0 - 37 U/L   ALT 11 0 - 35 U/L   Alkaline Phosphatase 77 39 - 117 U/L   Total Bilirubin 0.5 0.3 - 1.2 mg/dL   GFR calc non Af Amer 37 (L) >90 mL/min   GFR calc Af Amer 43 (L) >90 mL/min    Comment: (NOTE) The eGFR has been calculated using the CKD EPI equation. This calculation has not been validated in all clinical situations. eGFR's persistently <90 mL/min signify possible Chronic Kidney Disease.    Anion gap 11 5 - 15  Marissa [name] is a 74 y.o. female with a history of CAD, Chronic Diastolic CHF, HTN, Stage II CKD, Diverticulosis, and Gout who presents to the ED with complaints of passing BRBPR x 10 since 5 AM. She reported having lower abdominal pain for 1 week prior to admission, right more than left  sided and associated with dizziness and weakness. She denied nausea and vomiting and hematemesis and melena.She reported 2 previous episodes of diverticular bleeding, 7 and 3 years ago. In the ED, she was found to have an initial hemoglobin of 8.9, and her last known hemoglobin was 11.0. An FOBT was performed and was HEME positive, and she was found to be orthostatic. She was given 1 unit blood transfusion on the day of admission. Colonoscopy demonstrated diverticulosis Assessment/Plan  Acute lower GI bleed likely secondary to diverticulosis with inspissated stool and one bleeding diverticulum in the sigmoid colon which was treated with 2 clips.  - Hold ASA and NSAIDS for two weeks - High fiber diet and liberal fluids - Repeat colonoscopy in 10 years - Telemetry: normal sinus rhythm Hemoglobin stable   Acute blood loss anemia superimposed on microcytic anemia - Iron studies, B12, folate still pending - TSH 2.4 Patient transfused with 2 units of packed red blood cells during this admission as hemoglobin had gone down to 7.2 , hg 8.6 prior to DC  Iron supplementation provided at discharge   GERD, stable, continue Protonix  CAD, chest pain free - Holding aspirin due to GI bleed, may resume in 2 weeks if CBC stable - Started beta blocker - Increased statin to high-dose  Chronic diastolic heart failure, appears euvolemic - Daily weights - Once diet advanced, low sodium diet  Essential hypertension/hyperlipidemia, blood pressure wnl to elevated - increase beta blocker - Treat pain - Increased statin secondary to CAD  CKD stage II, creatinine at baseline of 1.4-1.5 - Minimize nephrotoxins and renally dose medications  Creatinine at baseline  Gout/Arthritis, gout flare in feet - Held NSAIDS and prednisone due to recent bleeding Increased colchicine to 0.6 mg by mouth twice a day Started on prednisone by mouth to be continued for another 5 days Given  chronic kidney disease indomethacin is contraindicated - Continue diclofenac gel Patient on by mouth prednisone at home chronically     Discharge Exam:    Blood pressure 153/80, pulse 80, temperature 98.4 F (36.9 C), temperature source Oral, resp. rate 18, height _0  (1.626 m), weight 102.9 kg (226 lb 13.7 oz), SpO2 100 %.  General: Obese female,No acute distress  HEENT: NCAT, MMM  Cardiovascular: RRR, nl S1, S2 no mrg, 2+ pulses, warm extremities  Respiratory: CTAB, no increased WOB  Abdomen: NABS, soft, nondistended, TTP in epigastrium without rebound or guarding  MSK: Normal tone and bulk, warmth and swelling with exquisite tenderness to palpation along the left foot navicular line, 1st MTP joint bilaterally, 1+ pitting and nonpitting edema around bilateral ankles, Grossly intact.        Discharge Instructions    Diet - low sodium heart healthy    Complete by:  As directed      Increase activity slowly    Complete by:  As directed            Follow-up Information    Follow up with [name], MD. Schedule an appointment as soon as possible for a visit in 3 days.   Specialty:  Internal Medicine   Contact information:   [address]  [place] [address] [phone]       Signed: Reyne Dumas 12/25/2014, 12:44 PM

## 2014-12-25 NOTE — Telephone Encounter (Signed)
Transition Care Management Follow-up Telephone Call D/C 12/25/14  How have you been since you were released from the hospital? Spoke with pt/daughter [name]) she stated mom said she is feeling alright   Do you understand why you were in the hospital? YES   Do you understand the discharge instrcutions? YES, reviewed with daughter  Items Reviewed:  Medications reviewed: YES  Allergies reviewed: YES  Dietary changes reviewed: NO  Referrals reviewed: No referrals needed   Functional Questionnaire:   Activities of Daily Living (ADLs):   She states she are independent in the following: ambulation, bathing and hygiene, feeding, continence, grooming, toileting and dressing States she doesn't need require assistance    Any transportation issues/concerns?: NO   Any patient concerns? No   Confirmed importance and date/time of follow-up visits scheduled: YES, made appt 12/30/14 w/Dr. Ronnald Ramp   Confirmed with patient if condition begins to worsen call PCP or go to the ER.

## 2014-12-25 NOTE — Progress Notes (Signed)
NURSING PROGRESS NOTE  [name] [id number] Discharge Data: 12/25/2014 1:23 PM Attending Provider: Reyne Dumas, MD [facility]:[id number] Ronnald Ramp, MD     Marissa [name] to be D/C'd Home per MD order.  Discussed with the patient the After Visit Summary and all questions fully answered. All IV's discontinued with no bleeding noted. All belongings returned to patient for patient to take home. Patient's wheelchair was delivered by the home health agency. Patient knows she need to make a follow up appointment   Last Vital Signs:  Blood pressure 153/80, pulse 80, temperature 98.4 F (36.9 C), temperature source Oral, resp. rate 18, height 5\' 4"  (1.626 m), weight 102.9 kg (226 lb 13.7 oz), SpO2 100 %.  Discharge Medication List   Medication List    STOP taking these medications        aspirin EC 81 MG tablet     cephALEXin 500 MG capsule  Commonly known as:  KEFLEX     erythromycin ophthalmic ointment     indomethacin 50 MG capsule  Commonly known as:  INDOCIN     olmesartan-hydrochlorothiazide 20-12.5 MG per tablet  Commonly known as:  BENICAR HCT      TAKE these medications        amLODipine 10 MG tablet  Commonly known as:  NORVASC  Take 1 tablet (10 mg total) by mouth daily.     atorvastatin 40 MG tablet  Commonly known as:  LIPITOR  Take 1 tablet (40 mg total) by mouth daily.     colchicine 0.6 MG tablet  Take 1 tablet (0.6 mg total) by mouth 2 (two) times daily.     diclofenac sodium 1 % Gel  Commonly known as:  VOLTAREN  Apply 4 g topically 4 (four) times daily.     furosemide 20 MG tablet  Commonly known as:  LASIX  Take 20 mg by mouth daily as needed for fluid.     HYDROcodone-acetaminophen 5-325 MG per tablet  Commonly known as:  NORCO/VICODIN  Take 2 tablets by mouth every 4 (four) hours as needed for moderate pain or severe pain.     Iron 325 (65 FE) MG Tabs  Take 325 mg by mouth 2 (two) times daily.     metoprolol tartrate 25 MG tablet  Commonly known as:   LOPRESSOR  Take 1 tablet (25 mg total) by mouth every morning.     nitroGLYCERIN 0.4 MG SL tablet  Commonly known as:  NITROSTAT  Place 1 tablet (0.4 mg total) under the tongue every 5 (five) minutes as needed for chest pain.     olmesartan 20 MG tablet  Commonly known as:  BENICAR  Take 1 tablet (20 mg total) by mouth daily.     pantoprazole 40 MG tablet  Commonly known as:  PROTONIX  Take 1 tablet (40 mg total) by mouth daily.     predniSONE 10 MG tablet  Commonly known as:  DELTASONE  Take 4 tablets (40 mg total) by mouth daily with breakfast.

## 2014-12-26 ENCOUNTER — Other Ambulatory Visit (HOSPITAL_COMMUNITY): Payer: Self-pay

## 2014-12-26 ENCOUNTER — Encounter (HOSPITAL_COMMUNITY): Payer: Self-pay | Admitting: *Deleted

## 2014-12-26 ENCOUNTER — Inpatient Hospital Stay (HOSPITAL_COMMUNITY)
Admission: EM | Admit: 2014-12-26 | Discharge: 2014-12-29 | Disposition: A | Discharge status: Home / Self Care | Payer: Medicare Other | Source: Home / Self Care | Attending: Internal Medicine | Admitting: Internal Medicine

## 2014-12-26 DIAGNOSIS — Z955 Presence of coronary angioplasty implant and graft: Secondary | ICD-10-CM

## 2014-12-26 DIAGNOSIS — N179 Acute kidney failure, unspecified: Secondary | ICD-10-CM | POA: Diagnosis present

## 2014-12-26 DIAGNOSIS — Z7952 Long term (current) use of systemic steroids: Secondary | ICD-10-CM

## 2014-12-26 DIAGNOSIS — N183 Chronic kidney disease, stage 3 unspecified: Secondary | ICD-10-CM | POA: Diagnosis present

## 2014-12-26 DIAGNOSIS — I472 Ventricular tachycardia: Secondary | ICD-10-CM

## 2014-12-26 DIAGNOSIS — M1000 Idiopathic gout, unspecified site: Secondary | ICD-10-CM

## 2014-12-26 DIAGNOSIS — I10 Essential (primary) hypertension: Secondary | ICD-10-CM | POA: Diagnosis present

## 2014-12-26 DIAGNOSIS — G8929 Other chronic pain: Secondary | ICD-10-CM

## 2014-12-26 DIAGNOSIS — Z86718 Personal history of other venous thrombosis and embolism: Secondary | ICD-10-CM

## 2014-12-26 DIAGNOSIS — K922 Gastrointestinal hemorrhage, unspecified: Secondary | ICD-10-CM | POA: Diagnosis present

## 2014-12-26 DIAGNOSIS — K5791 Diverticulosis of intestine, part unspecified, without perforation or abscess with bleeding: Secondary | ICD-10-CM

## 2014-12-26 DIAGNOSIS — E119 Type 2 diabetes mellitus without complications: Secondary | ICD-10-CM | POA: Diagnosis present

## 2014-12-26 DIAGNOSIS — R519 Headache, unspecified: Secondary | ICD-10-CM

## 2014-12-26 DIAGNOSIS — G4733 Obstructive sleep apnea (adult) (pediatric): Secondary | ICD-10-CM | POA: Diagnosis present

## 2014-12-26 DIAGNOSIS — M109 Gout, unspecified: Secondary | ICD-10-CM | POA: Diagnosis present

## 2014-12-26 DIAGNOSIS — K219 Gastro-esophageal reflux disease without esophagitis: Secondary | ICD-10-CM

## 2014-12-26 DIAGNOSIS — Z981 Arthrodesis status: Secondary | ICD-10-CM

## 2014-12-26 DIAGNOSIS — E785 Hyperlipidemia, unspecified: Secondary | ICD-10-CM | POA: Diagnosis present

## 2014-12-26 DIAGNOSIS — Z79899 Other long term (current) drug therapy: Secondary | ICD-10-CM

## 2014-12-26 DIAGNOSIS — I2510 Atherosclerotic heart disease of native coronary artery without angina pectoris: Secondary | ICD-10-CM | POA: Diagnosis present

## 2014-12-26 DIAGNOSIS — I129 Hypertensive chronic kidney disease with stage 1 through stage 4 chronic kidney disease, or unspecified chronic kidney disease: Secondary | ICD-10-CM

## 2014-12-26 DIAGNOSIS — R079 Chest pain, unspecified: Secondary | ICD-10-CM | POA: Insufficient documentation

## 2014-12-26 DIAGNOSIS — F0390 Unspecified dementia without behavioral disturbance: Secondary | ICD-10-CM | POA: Diagnosis present

## 2014-12-26 DIAGNOSIS — R51 Headache: Secondary | ICD-10-CM

## 2014-12-26 DIAGNOSIS — D62 Acute posthemorrhagic anemia: Secondary | ICD-10-CM

## 2014-12-26 LAB — CBC WITH DIFFERENTIAL/PLATELET
BASOS PCT: 0 % (ref 0–1)
Basophils Absolute: 0 10*3/uL (ref 0.0–0.1)
EOS PCT: 1 % (ref 0–5)
Eosinophils Absolute: 0.1 10*3/uL (ref 0.0–0.7)
HCT: 28.6 % — ABNORMAL LOW (ref 36.0–46.0)
HEMOGLOBIN: 8.8 g/dL — AB (ref 12.0–15.0)
LYMPHS PCT: 16 % (ref 12–46)
Lymphs Abs: 1.2 10*3/uL (ref 0.7–4.0)
MCH: 22.6 pg — AB (ref 26.0–34.0)
MCHC: 30.8 g/dL (ref 30.0–36.0)
MCV: 73.5 fL — ABNORMAL LOW (ref 78.0–100.0)
Monocytes Absolute: 0.8 10*3/uL (ref 0.1–1.0)
Monocytes Relative: 10 % (ref 3–12)
NEUTROS PCT: 73 % (ref 43–77)
Neutro Abs: 5.5 10*3/uL (ref 1.7–7.7)
Platelets: 259 10*3/uL (ref 150–400)
RBC: 3.89 MIL/uL (ref 3.87–5.11)
RDW: 14.4 % (ref 11.5–15.5)
WBC: 7.6 10*3/uL (ref 4.0–10.5)

## 2014-12-26 LAB — URINALYSIS, ROUTINE W REFLEX MICROSCOPIC
BILIRUBIN URINE: NEGATIVE
GLUCOSE, UA: NEGATIVE mg/dL
HGB URINE DIPSTICK: NEGATIVE
KETONES UR: NEGATIVE mg/dL
Nitrite: NEGATIVE
PROTEIN: NEGATIVE mg/dL
Specific Gravity, Urine: 1.021 (ref 1.005–1.030)
UROBILINOGEN UA: 0.2 mg/dL (ref 0.0–1.0)
pH: 5 (ref 5.0–8.0)

## 2014-12-26 LAB — COMPREHENSIVE METABOLIC PANEL
ALT: 14 U/L (ref 0–35)
AST: 24 U/L (ref 0–37)
Albumin: 3.1 g/dL — ABNORMAL LOW (ref 3.5–5.2)
Alkaline Phosphatase: 69 U/L (ref 39–117)
Anion gap: 12 (ref 5–15)
BILIRUBIN TOTAL: 0.4 mg/dL (ref 0.3–1.2)
BUN: 29 mg/dL — ABNORMAL HIGH (ref 6–23)
CHLORIDE: 111 mmol/L (ref 96–112)
CO2: 20 mmol/L (ref 19–32)
Calcium: 9.1 mg/dL (ref 8.4–10.5)
Creatinine, Ser: 1.8 mg/dL — ABNORMAL HIGH (ref 0.50–1.10)
GFR calc Af Amer: 31 mL/min — ABNORMAL LOW (ref >90)
GFR calc non Af Amer: 27 mL/min — ABNORMAL LOW (ref >90)
Glucose, Bld: 124 mg/dL — ABNORMAL HIGH (ref 70–99)
Potassium: 3.6 mmol/L (ref 3.5–5.1)
Sodium: 143 mmol/L (ref 135–145)
Total Protein: 6.7 g/dL (ref 6.0–8.3)

## 2014-12-26 LAB — PROTIME-INR
INR: 1.08 (ref 0.00–1.49)
Prothrombin Time: 14.1 seconds (ref 11.6–15.2)

## 2014-12-26 LAB — TYPE AND SCREEN
ABO/RH(D): B POS
Antibody Screen: NEGATIVE

## 2014-12-26 LAB — URINE MICROSCOPIC-ADD ON

## 2014-12-26 LAB — SAMPLE TO BLOOD BANK

## 2014-12-26 LAB — APTT: APTT: 25 s (ref 24–37)

## 2014-12-26 LAB — LIPASE, BLOOD: Lipase: 27 U/L (ref 11–59)

## 2014-12-26 MED ORDER — COLCHICINE 0.6 MG PO TABS
0.6000 mg | ORAL_TABLET | Freq: Two times a day (BID) | ORAL | Status: DC
  Administered 2014-12-27 – 2014-12-29 (×6): 0.6 mg via ORAL
  Filled 2014-12-26 (×7): qty 1

## 2014-12-26 MED ORDER — ATORVASTATIN CALCIUM 40 MG PO TABS
40.0000 mg | ORAL_TABLET | Freq: Every day | ORAL | Status: DC
  Administered 2014-12-27 – 2014-12-29 (×3): 40 mg via ORAL
  Filled 2014-12-26 (×3): qty 1

## 2014-12-26 MED ORDER — FERROUS SULFATE 325 (65 FE) MG PO TABS
325.0000 mg | ORAL_TABLET | Freq: Two times a day (BID) | ORAL | Status: DC
  Administered 2014-12-27 – 2014-12-29 (×6): 325 mg via ORAL
  Filled 2014-12-26 (×7): qty 1

## 2014-12-26 MED ORDER — SODIUM CHLORIDE 0.9 % IV SOLN
1000.0000 mL | INTRAVENOUS | Status: DC
  Administered 2014-12-26: 1000 mL via INTRAVENOUS

## 2014-12-26 MED ORDER — ACETAMINOPHEN 325 MG PO TABS
650.0000 mg | ORAL_TABLET | Freq: Four times a day (QID) | ORAL | Status: DC | PRN
  Administered 2014-12-27 – 2014-12-28 (×3): 650 mg via ORAL
  Filled 2014-12-26 (×3): qty 2

## 2014-12-26 MED ORDER — METOPROLOL TARTRATE 25 MG PO TABS
25.0000 mg | ORAL_TABLET | Freq: Every morning | ORAL | Status: DC
  Administered 2014-12-27 – 2014-12-29 (×3): 25 mg via ORAL
  Filled 2014-12-26 (×3): qty 1

## 2014-12-26 MED ORDER — HYDRALAZINE HCL 20 MG/ML IJ SOLN: 10.0000 mg | INTRAMUSCULAR | Status: DC | PRN

## 2014-12-26 MED ORDER — SODIUM CHLORIDE 0.9 % IV SOLN
INTRAVENOUS | Status: AC
  Administered 2014-12-26: 1000 mL via INTRAVENOUS

## 2014-12-26 MED ORDER — ONDANSETRON HCL 4 MG/2ML IJ SOLN: 4.0000 mg | Freq: Four times a day (QID) | INTRAMUSCULAR | Status: DC | PRN

## 2014-12-26 MED ORDER — ONDANSETRON HCL 4 MG PO TABS: 4.0000 mg | ORAL_TABLET | Freq: Four times a day (QID) | ORAL | Status: DC | PRN

## 2014-12-26 MED ORDER — NITROGLYCERIN 0.4 MG SL SUBL: 0.4000 mg | SUBLINGUAL_TABLET | SUBLINGUAL | Status: DC | PRN

## 2014-12-26 MED ORDER — ONDANSETRON HCL 4 MG/2ML IJ SOLN: 4.0000 mg | Freq: Once | INTRAMUSCULAR | Status: DC

## 2014-12-26 MED ORDER — ACETAMINOPHEN 650 MG RE SUPP: 650.0000 mg | Freq: Four times a day (QID) | RECTAL | Status: DC | PRN

## 2014-12-26 NOTE — ED Provider Notes (Signed)
CSN: [id number]     Arrival date & time 12/26/14  1737 History   First MD Initiated Contact with Patient 12/26/14 1821     Chief Complaint  Patient presents with  . Rectal Bleeding   HPI Patient was recently admitted to the hospital for diverticular bleeding. The patient was admitted on April 9 and just released home yesterday. Patient states while she was in the hospital she had a colonoscopy procedure. She was also transfused 2 units of blood. Patient states today she started having rectal bleeding again. She has noted clots of blood.  She denies any nausea or vomiting. She has had some lightheadedness. She denies any chest pain or shortness of breath. Past Medical History  Diagnosis Date  . Hypertension   . Renal insufficiency     baseline Cr ~ 1.3  . Hyperlipidemia   . History of DVT (deep vein thrombosis)   . History of colonic polyps   . Gout   . Polyarthritis   . Microcytic anemia   . OSA (obstructive sleep apnea)   . GERD (gastroesophageal reflux disease)   . Esophagitis   . Hidradenitis suppurativa     s/p axillary sweat gland removal  . H/O blood transfusion reaction     at [facility] hospital  . Headache(784.0)   . Diverticulitis   . Difficulty sleeping   . PMB (postmenopausal bleeding)     X 2 YRS  . Bowel trouble     OCCASIONAL BOWEL INCONTINENCE  . Diabetes mellitus without complication     diet controlled  . Fibroids     s/p TAH/BSO 07/2012  . CAD (coronary artery disease)     a. 8/07 had BMS to OM.  b. In-stent restenosis with later Promus DES to same site.  c. Lexiscan myoview in 1/13 showed EF 67%, no ischemia or infarction.  d. Echo (2/13) showed EF 55-60%, moderate LVH, mild MR.  e. Lex MV 6/14: no isch, EF 53%;  f. Echo 6/14: mild LVH, EF 55-60%, Gr 1 DD, MAC, mild MR, mild LAE  . Peripheral neuropathy   . Chronic diastolic CHF (congestive heart failure) 06/29/2014   Past Surgical History  Procedure Laterality Date  . Cervical fusion c-4-5 other    .  Cervical laminectomy other    . Tonsillectomy other    . Umbilical hernia repair    . Axillary seat gland removal other    . Right knee arthroscopy other    . Colectomy  2004    left partial for perforation  . Cholecystectomy    . Tubal ligation    . Ptca and stenting      of mid and distal circumflex coronary artery. 1st stent was 04/2006, with drug eluting stent placed on 12/20/07 (30-50% RCA stenosis). Cardiologist Dr. Einar Gip.  . Dilation and curettage of uterus  02/2011  . Hysteroscopy w/d&c  05/04/2012    Procedure: DILATATION AND CURETTAGE /HYSTEROSCOPY;  Surgeon: Lahoma Crocker, MD;  Location: [facility];  Service: Gynecology;  Laterality: N/A;  . Robotic assisted total hysterectomy with bilateral salpingo oopherectomy  07/24/2012    Procedure: ROBOTIC ASSISTED TOTAL HYSTERECTOMY WITH BILATERAL SALPINGO OOPHORECTOMY;  Surgeon: Janie Morning, MD PHD;  Location: [facility];  Service: Gynecology;  Laterality: Bilateral;  attempted ,  converted to abdomnial hysterectomy  . Abdominal hysterectomy  07/24/2012    Procedure: HYSTERECTOMY ABDOMINAL;  Surgeon: Janie Morning, MD PHD;  Location: [facility];  Service: Gynecology;;  . Salpingoophorectomy  07/24/2012    Procedure: Max Fickle  OOPHORECTOMY;  Surgeon: Janie Morning, MD PHD;  Location: [facility];  Service: Gynecology;  Laterality: Bilateral;  . Abdominal wound dehiscence  08/01/2012    Procedure: ABDOMINAL WOUND DEHISCENCE;  Surgeon: Lahoma Crocker, MD;  Location: [facility];  Service: Gynecology;  Laterality: N/A;  . Laparotomy  08/02/2012    Procedure: EXPLORATORY LAPAROTOMY;  Surgeon: Imogene Burn. Georgette Dover, MD;  Location: [facility];  Service: General;  Laterality: N/A;  placement of abdominal wound vac  . Colonoscopy N/A 12/22/2014    Procedure: COLONOSCOPY;  Surgeon: Juanita Craver, MD;  Location: [facility];  Service: Endoscopy;  Laterality: N/A;   Family History  Problem Relation Age of Onset  . Diabetes Mother   . Coronary artery disease Mother   .  Diabetes Sister   . Coronary artery disease Sister    History  Substance Use Topics  . Smoking status: Never Smoker   . Smokeless tobacco: Never Used  . Alcohol Use: No   OB History    No data available     Review of Systems  All other systems reviewed and are negative.     Allergies  Review of patient's allergies indicates no known allergies.  Home Medications   Prior to Admission medications   Medication Sig Start Date End Date Taking? Authorizing Provider  amLODipine (NORVASC) 10 MG tablet Take 1 tablet (10 mg total) by mouth daily. 05/29/14  Yes Janith Lima, MD  atorvastatin (LIPITOR) 40 MG tablet Take 1 tablet (40 mg total) by mouth daily. 12/25/14  Yes Reyne Dumas, MD  colchicine 0.6 MG tablet Take 1 tablet (0.6 mg total) by mouth 2 (two) times daily. 12/25/14  Yes Reyne Dumas, MD  diclofenac sodium (VOLTAREN) 1 % GEL Apply 4 g topically 4 (four) times daily. 12/25/14  Yes Reyne Dumas, MD  Ferrous Sulfate (IRON) 325 (65 FE) MG TABS Take 325 mg by mouth 2 (two) times daily. 12/25/14  Yes Reyne Dumas, MD  furosemide (LASIX) 20 MG tablet Take 20 mg by mouth daily as needed for fluid.  11/03/14  Yes Historical Provider, MD  metoprolol tartrate (LOPRESSOR) 25 MG tablet Take 1 tablet (25 mg total) by mouth every morning. 12/25/14  Yes Reyne Dumas, MD  olmesartan (BENICAR) 20 MG tablet Take 1 tablet (20 mg total) by mouth daily. 12/25/14  Yes Reyne Dumas, MD  pantoprazole (PROTONIX) 40 MG tablet Take 1 tablet (40 mg total) by mouth daily. 12/25/14  Yes Reyne Dumas, MD  predniSONE (DELTASONE) 10 MG tablet Take 4 tablets (40 mg total) by mouth daily with breakfast. 12/25/14  Yes Reyne Dumas, MD  HYDROcodone-acetaminophen (NORCO/VICODIN) 5-325 MG per tablet Take 2 tablets by mouth every 4 (four) hours as needed for moderate pain or severe pain. Patient not taking: Reported on 12/20/2014 11/03/14   Starlyn Skeans, PA-C  nitroGLYCERIN (NITROSTAT) 0.4 MG SL tablet Place 1 tablet (0.4  mg total) under the tongue every 5 (five) minutes as needed for chest pain. 02/26/13   Larey Dresser, MD   BP 148/58 mmHg  Pulse 71  Temp(Src) 98.1 F (36.7 C) (Oral)  Resp 21  Ht 5\' 4"  (1.626 m)  Wt 227 lb (102.967 kg)  BMI 38.95 kg/m2  SpO2 100% Physical Exam  Constitutional: No distress.  Overweight  HENT:  Head: Normocephalic and atraumatic.  Right Ear: External ear normal.  Left Ear: External ear normal.  Eyes: Conjunctivae are normal. Right eye exhibits no discharge. Left eye exhibits no discharge. No scleral icterus.  Neck: Neck supple.  No tracheal deviation present.  Cardiovascular: Normal rate, regular rhythm and intact distal pulses.   Pulmonary/Chest: Effort normal and breath sounds normal. No stridor. No respiratory distress. She has no wheezes. She has no rales.  Abdominal: Soft. Bowel sounds are normal. She exhibits no distension. There is no tenderness. There is no rebound and no guarding.  Musculoskeletal: She exhibits no edema or tenderness.  Neurological: She is alert. She has normal strength. No cranial nerve deficit (no facial droop, extraocular movements intact, no slurred speech) or sensory deficit. She exhibits normal muscle tone. She displays no seizure activity. Coordination normal.  Skin: Skin is warm and dry. No rash noted.  Psychiatric: She has a normal mood and affect.  Nursing note and vitals reviewed.  clots of blood noted in the commode  ED Course  Procedures (including critical care time) Labs Review Labs Reviewed  CBC WITH DIFFERENTIAL/PLATELET - Abnormal; Notable for the following:    Hemoglobin 8.8 (*)    HCT 28.6 (*)    MCV 73.5 (*)    MCH 22.6 (*)    All other components within normal limits  COMPREHENSIVE METABOLIC PANEL - Abnormal; Notable for the following:    Glucose, Bld 124 (*)    BUN 29 (*)    Creatinine, Ser 1.80 (*)    Albumin 3.1 (*)    GFR calc non Af Amer 27 (*)    GFR calc Af Amer 31 (*)    All other components within  normal limits  URINALYSIS, ROUTINE W REFLEX MICROSCOPIC - Abnormal; Notable for the following:    Leukocytes, UA SMALL (*)    All other components within normal limits  URINE MICROSCOPIC-ADD ON - Abnormal; Notable for the following:    Bacteria, UA FEW (*)    All other components within normal limits  LIPASE, BLOOD  APTT  PROTIME-INR  SAMPLE TO BLOOD BANK  TYPE AND SCREEN     While the patient was on the monitor she had a questionable short run of a wide complex tachycardia on the monitor. May be 7 or 8 beats. The patient was asymptomatic. ? Artifact. We'll continue to monitor closely.  No further episodes noted throughout the rest of her stay. MDM   Final diagnoses:  Gastrointestinal hemorrhage associated with intestinal diverticulosis    The patient was recently in the hospital for gastrointestinal bleeding. The patient returned today because of recurrent rectal bleeding with large amounts of clots.  Patient's laboratory tests are stable. Considering her recent hospitalization and blood transfusion requirement I will consult the medical service for overnight observation due to her recurrent bleeding.    Dorie Rank, MD 12/26/14 2159

## 2014-12-26 NOTE — ED Notes (Signed)
IV team is currently at the bedside.

## 2014-12-26 NOTE — ED Notes (Signed)
Unsuccessful iv access with ultrasound.

## 2014-12-26 NOTE — ED Notes (Signed)
Attempted to call report

## 2014-12-26 NOTE — ED Notes (Signed)
Bright red rectal bleeding today.  She was just discharged yesterday from here where  She was transfused with 2 units of blood.  Dx diverticulitis.

## 2014-12-26 NOTE — ED Notes (Signed)
EMT and Dr. Tomi Bamberger at the bedside to see the patient.

## 2014-12-26 NOTE — H&P (Signed)
[facility] History and Physical  Marissa [name] ENI:[id number] DOB: Oct 04, 1940 DOA: 12/26/2014  Referring physician: Dr. Tomi Bamberger. ER physician. PCP: [name], MD  Specialists: Dr. Collene Mares.  Chief Complaint: Rectal bleeding.  HPI: Marissa [name] is a 74 y.o. female with history of CAD status post stenting, hypertension, hyperlipidemia, gout, chronic kidney disease who was admitted recently for rectal bleeding and was discharged home yesterday presents to the ER because patient had another episode of rectal bleeding at home this evening. Patient denies any nausea vomiting or abdominal pain. In the ER patient had at least 2 more episode of rectal bleeding. Patient also had brief nonsustained V. tach in the ER. Patient stated that when she had rectal bleeding she had some chest discomfort. Presently chest pain-free. EKG is unremarkable and troponins were negative. Patient's hemoglobin is at baseline when she was discharged yesterday. Patient during the recent stay had colonoscopy which showed pandiverticulosis and was instructed to stop her aspirin for 2 more weeks. Also during the recent stay patient had gout attack for which colchicine was given and presently her gout has subsided. Patient will be admitted for further management of her recurrent GI bleed. Patient is presently hemodynamically stable.   Review of Systems: As presented in the history of presenting illness, rest negative.  Past Medical History  Diagnosis Date  . Hypertension   . Renal insufficiency     baseline Cr ~ 1.3  . Hyperlipidemia   . History of DVT (deep vein thrombosis)   . History of colonic polyps   . Gout   . Polyarthritis   . Microcytic anemia   . OSA (obstructive sleep apnea)   . GERD (gastroesophageal reflux disease)   . Esophagitis   . Hidradenitis suppurativa     s/p axillary sweat gland removal  . H/O blood transfusion reaction     at [facility] hospital  . Headache(784.0)   . Diverticulitis   .  Difficulty sleeping   . PMB (postmenopausal bleeding)     X 2 YRS  . Bowel trouble     OCCASIONAL BOWEL INCONTINENCE  . Diabetes mellitus without complication     diet controlled  . Fibroids     s/p TAH/BSO 07/2012  . CAD (coronary artery disease)     a. 8/07 had BMS to OM.  b. In-stent restenosis with later Promus DES to same site.  c. Lexiscan myoview in 1/13 showed EF 67%, no ischemia or infarction.  d. Echo (2/13) showed EF 55-60%, moderate LVH, mild MR.  e. Lex MV 6/14: no isch, EF 53%;  f. Echo 6/14: mild LVH, EF 55-60%, Gr 1 DD, MAC, mild MR, mild LAE  . Peripheral neuropathy   . Chronic diastolic CHF (congestive heart failure) 06/29/2014   Past Surgical History  Procedure Laterality Date  . Cervical fusion c-4-5 other    . Cervical laminectomy other    . Tonsillectomy other    . Umbilical hernia repair    . Axillary seat gland removal other    . Right knee arthroscopy other    . Colectomy  2004    left partial for perforation  . Cholecystectomy    . Tubal ligation    . Ptca and stenting      of mid and distal circumflex coronary artery. 1st stent was 04/2006, with drug eluting stent placed on 12/20/07 (30-50% RCA stenosis). Cardiologist Dr. Einar Gip.  . Dilation and curettage of uterus  02/2011  . Hysteroscopy w/d&c  05/04/2012    Procedure: DILATATION  AND CURETTAGE /HYSTEROSCOPY;  Surgeon: Lahoma Crocker, MD;  Location: [place] [facility];  Service: Gynecology;  Laterality: N/A;  . Robotic assisted total hysterectomy with bilateral salpingo oopherectomy  07/24/2012    Procedure: ROBOTIC ASSISTED TOTAL HYSTERECTOMY WITH BILATERAL SALPINGO OOPHORECTOMY;  Surgeon: Janie Morning, MD PHD;  Location: [facility];  Service: Gynecology;  Laterality: Bilateral;  attempted ,  converted to abdomnial hysterectomy  . Abdominal hysterectomy  07/24/2012    Procedure: HYSTERECTOMY ABDOMINAL;  Surgeon: Janie Morning, MD PHD;  Location: [facility];  Service: Gynecology;;  . Salpingoophorectomy  07/24/2012     Procedure: SALPINGO OOPHORECTOMY;  Surgeon: Janie Morning, MD PHD;  Location: [facility];  Service: Gynecology;  Laterality: Bilateral;  . Abdominal wound dehiscence  08/01/2012    Procedure: ABDOMINAL WOUND DEHISCENCE;  Surgeon: Lahoma Crocker, MD;  Location: [place] [facility];  Service: Gynecology;  Laterality: N/A;  . Laparotomy  08/02/2012    Procedure: EXPLORATORY LAPAROTOMY;  Surgeon: Imogene Burn. Georgette Dover, MD;  Location: [facility];  Service: General;  Laterality: N/A;  placement of abdominal wound vac  . Colonoscopy N/A 12/22/2014    Procedure: COLONOSCOPY;  Surgeon: Juanita Craver, MD;  Location: [facility];  Service: Endoscopy;  Laterality: N/A;   Social History:  reports that she has never smoked. She has never used smokeless tobacco. She reports that she does not drink alcohol or use illicit drugs. Where does patient live home. Can patient participate in ADLs? Yes.  No Known Allergies  Family History:  Family History  Problem Relation Age of Onset  . Diabetes Mother   . Coronary artery disease Mother   . Diabetes Sister   . Coronary artery disease Sister       Prior to Admission medications   Medication Sig Start Date End Date Taking? Authorizing Provider  amLODipine (NORVASC) 10 MG tablet Take 1 tablet (10 mg total) by mouth daily. 05/29/14  Yes Janith Lima, MD  atorvastatin (LIPITOR) 40 MG tablet Take 1 tablet (40 mg total) by mouth daily. 12/25/14  Yes Reyne Dumas, MD  colchicine 0.6 MG tablet Take 1 tablet (0.6 mg total) by mouth 2 (two) times daily. 12/25/14  Yes Reyne Dumas, MD  diclofenac sodium (VOLTAREN) 1 % GEL Apply 4 g topically 4 (four) times daily. 12/25/14  Yes Reyne Dumas, MD  Ferrous Sulfate (IRON) 325 (65 FE) MG TABS Take 325 mg by mouth 2 (two) times daily. 12/25/14  Yes Reyne Dumas, MD  furosemide (LASIX) 20 MG tablet Take 20 mg by mouth daily as needed for fluid.  11/03/14  Yes Historical Provider, MD  metoprolol tartrate (LOPRESSOR) 25 MG tablet Take 1 tablet (25 mg  total) by mouth every morning. 12/25/14  Yes Reyne Dumas, MD  olmesartan (BENICAR) 20 MG tablet Take 1 tablet (20 mg total) by mouth daily. 12/25/14  Yes Reyne Dumas, MD  pantoprazole (PROTONIX) 40 MG tablet Take 1 tablet (40 mg total) by mouth daily. 12/25/14  Yes Reyne Dumas, MD  predniSONE (DELTASONE) 10 MG tablet Take 4 tablets (40 mg total) by mouth daily with breakfast. 12/25/14  Yes Reyne Dumas, MD  HYDROcodone-acetaminophen (NORCO/VICODIN) 5-325 MG per tablet Take 2 tablets by mouth every 4 (four) hours as needed for moderate pain or severe pain. Patient not taking: Reported on 12/20/2014 11/03/14   Starlyn Skeans, PA-C  nitroGLYCERIN (NITROSTAT) 0.4 MG SL tablet Place 1 tablet (0.4 mg total) under the tongue every 5 (five) minutes as needed for chest pain. 02/26/13   Larey Dresser, MD  Physical Exam: Filed Vitals:   12/26/14 2230 12/26/14 2245 12/26/14 2257 12/26/14 2331  BP: 138/61 130/44 130/44 187/75  Pulse: 68 70 67 76  Temp:   97.5 F (36.4 C) 98 F (36.7 C)  TempSrc:   Oral Oral  Resp: 16 14 15  131  Height:    5\' 4"  (1.626 m)  Weight:    103.9 kg (229 lb 0.9 oz)  SpO2: 100% 100% 100% 100%     General:  Moderately built and nourished.  Eyes: Anicteric no pallor.  ENT: No discharge from the ears eyes nose and mouth.  Neck: No mass felt.  Cardiovascular: S1 and S2 heard.  Respiratory: No rhonchi or crepitations.  Abdomen: Soft nontender bowel sounds present.  Skin: No rash.  Musculoskeletal: No edema.  Psychiatric: Appears normal.  Neurologic: Alert awake oriented to time place and person. Moves all extremities.  Labs on Admission:  Basic Metabolic Panel:  Recent Labs Lab 12/22/14 0800 12/23/14 0410 12/24/14 0418 12/25/14 0434 12/26/14 1815  NA 138 139 138 140 143  K 3.9 3.7 4.1 4.1 3.6  CL 106 107 108 108 111  CO2 18* 26 22 21 20   GLUCOSE 138* 95 94 131* 124*  BUN 13 11 12 17  29*  CREATININE 1.53* 1.49* 1.58* 1.37* 1.80*  CALCIUM 9.2 8.6  8.9 9.3 9.1   Liver Function Tests:  Recent Labs Lab 12/20/14 1926 12/25/14 0434 12/26/14 1815  AST 19 16 24   ALT 11 11 14   ALKPHOS 82 77 69  BILITOT 0.4 0.5 0.4  PROT 6.7 7.0 6.7  ALBUMIN 3.2* 3.0* 3.1*    Recent Labs Lab 12/26/14 1815  LIPASE 27   No results for input(s): AMMONIA in the last 168 hours. CBC:  Recent Labs Lab 12/20/14 1926 12/21/14 0437  12/22/14 0800 12/23/14 0410 12/24/14 0418 12/25/14 0434 12/26/14 1815  WBC 8.6 7.6  --   --  8.0 9.5 6.2 7.6  NEUTROABS 6.4  --   --   --   --   --   --  5.5  HGB 8.9* 8.6*  < > 9.1* 7.2* 8.3* 8.6* 8.8*  HCT 29.4* 27.9*  < > 31.0* 23.6* 27.5* 27.7* 28.6*  MCV 72.2* 73.0*  --   --  73.3* 75.8* 75.9* 73.5*  PLT 301 252  --   --  268 198 210 259  < > = values in this interval not displayed. Cardiac Enzymes: No results for input(s): CKTOTAL, CKMB, CKMBINDEX, TROPONINI in the last 168 hours.  BNP (last 3 results)  Recent Labs  11/02/14 2340  BNP 109.2*    ProBNP (last 3 results) No results for input(s): PROBNP in the last 8760 hours.  CBG: No results for input(s): GLUCAP in the last 168 hours.  Radiological Exams on Admission: No results found.  EKG: Independently reviewed. Normal sinus rhythm.  Assessment/Plan Active Problems:   Gout   Essential hypertension   Chronic kidney disease, stage 3   GI bleed   Gastrointestinal hemorrhage associated with intestinal diverticulosis   Hyperlipidemia   1. Rectal bleeding - most likely source diverticulosis. Patient's hemoglobin is at baseline. Patient is also hemodynamically stable. At this time will closely observe and if that is further bleeding or patient becomes hemodynamically unstable probably may need PET scan. Patient had colonoscopy done last week which showed pandiverticulosis. Patient was seen by gastroenterologist Dr. Benson Norway may discuss with Dr. Benson Norway in a.m. Closely follow CBC. 2. CAD status post stenting - patient's aspirin is on  hold secondary to  rectal bleeding. Patient did say that she has some chest discomfort briefly during the rectal bleeding episode. At this time patient is chest pain-free. Troponins were negative and EKG were unremarkable. We will cycle troponins. Patient also had a brief episode of nonsustained VT as noted by ER physician. Does not show frozen artifact or not. Check electrolytes and closely observe. Continue metoprolol. Continue statins. 3. Hypertension - at this time we'll keep patient on when necessary IV hydralazine to make sure patient is hemodynamically stable before continuing her antihypertensives. 4. Blood loss anemia - follow CBC. 5. Acute on chronic renal failure stage III - probably secondary to volume loss. Patient did receive IV fluids in the ER. Closely follow intake and output and metabolic panel. 6. Gout - on colchicine presently her recent acute gouty attack has subsided. 7. Hyperlipidemia - on statins.   DVT Prophylaxis SCDs.  Code Status: Full code.  Family Communication: None.  Disposition Plan: Admit to inpatient.    Carlos Quackenbush N. [facility] Pager [phone].  If 7PM-7AM, please contact night-coverage [email or web address] Password [facility] 12/26/2014, 11:33 PM

## 2014-12-26 NOTE — ED Notes (Signed)
At the bedside to attempt IV, 2nd RN called for use of ultrasound. Discussed plan of care with the patient

## 2014-12-26 NOTE — ED Notes (Signed)
Patient had run of vtach, Dr. Tomi Bamberger aware. No new orders but continue to monitor. Assisted patient with pericare.

## 2014-12-26 NOTE — ED Notes (Signed)
Dr. Raliegh Ip, hospitalist, at the bedside.

## 2014-12-26 NOTE — ED Notes (Signed)
Phlebotomy at the bedside  

## 2014-12-27 ENCOUNTER — Encounter (HOSPITAL_COMMUNITY): Payer: Self-pay | Admitting: *Deleted

## 2014-12-27 ENCOUNTER — Inpatient Hospital Stay (HOSPITAL_COMMUNITY): Payer: Medicare Other

## 2014-12-27 DIAGNOSIS — N183 Chronic kidney disease, stage 3 (moderate): Secondary | ICD-10-CM

## 2014-12-27 DIAGNOSIS — R072 Precordial pain: Secondary | ICD-10-CM

## 2014-12-27 DIAGNOSIS — K5791 Diverticulosis of intestine, part unspecified, without perforation or abscess with bleeding: Secondary | ICD-10-CM

## 2014-12-27 DIAGNOSIS — I10 Essential (primary) hypertension: Secondary | ICD-10-CM

## 2014-12-27 DIAGNOSIS — R079 Chest pain, unspecified: Secondary | ICD-10-CM | POA: Insufficient documentation

## 2014-12-27 DIAGNOSIS — D62 Acute posthemorrhagic anemia: Secondary | ICD-10-CM

## 2014-12-27 LAB — BASIC METABOLIC PANEL
Anion gap: 8 (ref 5–15)
BUN: 28 mg/dL — ABNORMAL HIGH (ref 6–23)
CALCIUM: 8.5 mg/dL (ref 8.4–10.5)
CO2: 22 mmol/L (ref 19–32)
CREATININE: 1.64 mg/dL — AB (ref 0.50–1.10)
Chloride: 114 mmol/L — ABNORMAL HIGH (ref 96–112)
GFR calc Af Amer: 34 mL/min — ABNORMAL LOW (ref >90)
GFR calc non Af Amer: 30 mL/min — ABNORMAL LOW (ref >90)
Glucose, Bld: 92 mg/dL (ref 70–99)
POTASSIUM: 3.8 mmol/L (ref 3.5–5.1)
Sodium: 144 mmol/L (ref 135–145)

## 2014-12-27 LAB — CBC
HEMATOCRIT: 26.7 % — AB (ref 36.0–46.0)
Hemoglobin: 8.3 g/dL — ABNORMAL LOW (ref 12.0–15.0)
MCH: 23.2 pg — ABNORMAL LOW (ref 26.0–34.0)
MCHC: 31.1 g/dL (ref 30.0–36.0)
MCV: 74.6 fL — AB (ref 78.0–100.0)
Platelets: 219 10*3/uL (ref 150–400)
RBC: 3.58 MIL/uL — AB (ref 3.87–5.11)
RDW: 14.6 % (ref 11.5–15.5)
WBC: 6.7 10*3/uL (ref 4.0–10.5)

## 2014-12-27 LAB — GLUCOSE, CAPILLARY
Glucose-Capillary: 80 mg/dL (ref 70–99)
Glucose-Capillary: 83 mg/dL (ref 70–99)
Glucose-Capillary: 86 mg/dL (ref 70–99)
Glucose-Capillary: 87 mg/dL (ref 70–99)

## 2014-12-27 LAB — HEMOGLOBIN AND HEMATOCRIT, BLOOD
HCT: 26.9 % — ABNORMAL LOW (ref 36.0–46.0)
HEMATOCRIT: 29.3 % — AB (ref 36.0–46.0)
Hemoglobin: 8.2 g/dL — ABNORMAL LOW (ref 12.0–15.0)
Hemoglobin: 9.2 g/dL — ABNORMAL LOW (ref 12.0–15.0)

## 2014-12-27 LAB — TROPONIN I

## 2014-12-27 LAB — MRSA PCR SCREENING: MRSA by PCR: NEGATIVE

## 2014-12-27 LAB — MAGNESIUM: MAGNESIUM: 1.8 mg/dL (ref 1.5–2.5)

## 2014-12-27 MED ORDER — MAGNESIUM SULFATE 2 GM/50ML IV SOLN
2.0000 g | Freq: Once | INTRAVENOUS | Status: AC
  Administered 2014-12-28: 2 g via INTRAVENOUS
  Filled 2014-12-27: qty 50

## 2014-12-27 MED ORDER — PANTOPRAZOLE SODIUM 40 MG IV SOLR
40.0000 mg | INTRAVENOUS | Status: DC
  Administered 2014-12-27 – 2014-12-28 (×2): 40 mg via INTRAVENOUS
  Filled 2014-12-27 (×4): qty 40

## 2014-12-27 NOTE — Consult Note (Signed)
Consultation covering for Drs. Selinda Michaels  Referring Provider: [facility] Primary Care Physician:  [name], MD Primary Gastroenterologist:  Dr. Verdia Kuba  Reason for Consultation:  GI bleeding  HPI: Marissa [name] is a 74 y.o. female known to Dr. Verdia Kuba who was just hospitalized 4/ 9 through 12/25/2014 with a diverticular bleed. She underwent colonoscopy with Dr. Collene Mares  on 12/22/2014 with finding of pan diverticulosis and was found to have a bleeding diverticulum at 40 cm which was endoclipped 2. Patient was discharged home on 4/14 with a hemoglobin of 8.6. She apparently did well until last evening when she had another bloody bowel movement as well as some associated chest discomfort and presented back to the emergency room. Troponins in the ER were negative and EKG unchanged hemoglobin was 8.8, stable. Patient does have history of coronary artery disease status post previous stent, adult onset diabetes mellitus, sleep apnea, stage III chronic kidney disease and congestive heart failure. She also has diagnosis of mild dementia. Patient has been stable overnight without further obvious GI bleeding and hemoglobin this morning is 8.3   Past Medical History  Diagnosis Date  . Hypertension   . Renal insufficiency     baseline Cr ~ 1.3  . Hyperlipidemia   . History of DVT (deep vein thrombosis)   . History of colonic polyps   . Gout   . Polyarthritis   . Microcytic anemia   . OSA (obstructive sleep apnea)   . GERD (gastroesophageal reflux disease)   . Esophagitis   . Hidradenitis suppurativa     s/p axillary sweat gland removal  . H/O blood transfusion reaction     at [facility] hospital  . Headache(784.0)   . Diverticulitis   . Difficulty sleeping   . PMB (postmenopausal bleeding)     X 2 YRS  . Bowel trouble     OCCASIONAL BOWEL INCONTINENCE  . Diabetes mellitus without complication     diet controlled  . Fibroids     s/p TAH/BSO 07/2012  . CAD (coronary artery  disease)     a. 8/07 had BMS to OM.  b. In-stent restenosis with later Promus DES to same site.  c. Lexiscan myoview in 1/13 showed EF 67%, no ischemia or infarction.  d. Echo (2/13) showed EF 55-60%, moderate LVH, mild MR.  e. Lex MV 6/14: no isch, EF 53%;  f. Echo 6/14: mild LVH, EF 55-60%, Gr 1 DD, MAC, mild MR, mild LAE  . Peripheral neuropathy   . Chronic diastolic CHF (congestive heart failure) 06/29/2014    Past Surgical History  Procedure Laterality Date  . Cervical fusion c-4-5 other    . Cervical laminectomy other    . Tonsillectomy other    . Umbilical hernia repair    . Axillary seat gland removal other    . Right knee arthroscopy other    . Colectomy  2004    left partial for perforation  . Cholecystectomy    . Tubal ligation    . Ptca and stenting      of mid and distal circumflex coronary artery. 1st stent was 04/2006, with drug eluting stent placed on 12/20/07 (30-50% RCA stenosis). Cardiologist Dr. Einar Gip.  . Dilation and curettage of uterus  02/2011  . Hysteroscopy w/d&c  05/04/2012    Procedure: DILATATION AND CURETTAGE /HYSTEROSCOPY;  Surgeon: Lahoma Crocker, MD;  Location: [place] [facility];  Service: Gynecology;  Laterality: N/A;  . Robotic assisted total hysterectomy with bilateral salpingo oopherectomy  07/24/2012    Procedure: ROBOTIC ASSISTED TOTAL HYSTERECTOMY WITH BILATERAL SALPINGO OOPHORECTOMY;  Surgeon: Janie Morning, MD PHD;  Location: [facility];  Service: Gynecology;  Laterality: Bilateral;  attempted ,  converted to abdomnial hysterectomy  . Abdominal hysterectomy  07/24/2012    Procedure: HYSTERECTOMY ABDOMINAL;  Surgeon: Janie Morning, MD PHD;  Location: [facility];  Service: Gynecology;;  . Salpingoophorectomy  07/24/2012    Procedure: SALPINGO OOPHORECTOMY;  Surgeon: Janie Morning, MD PHD;  Location: [facility];  Service: Gynecology;  Laterality: Bilateral;  . Abdominal wound dehiscence  08/01/2012    Procedure: ABDOMINAL WOUND DEHISCENCE;  Surgeon: Lahoma Crocker,  MD;  Location: [place] [facility];  Service: Gynecology;  Laterality: N/A;  . Laparotomy  08/02/2012    Procedure: EXPLORATORY LAPAROTOMY;  Surgeon: Imogene Burn. Georgette Dover, MD;  Location: [facility];  Service: General;  Laterality: N/A;  placement of abdominal wound vac  . Colonoscopy N/A 12/22/2014    Procedure: COLONOSCOPY;  Surgeon: Juanita Craver, MD;  Location: [facility];  Service: Endoscopy;  Laterality: N/A;    Prior to Admission medications   Medication Sig Start Date End Date Taking? Authorizing Provider  amLODipine (NORVASC) 10 MG tablet Take 1 tablet (10 mg total) by mouth daily. 05/29/14  Yes Janith Lima, MD  atorvastatin (LIPITOR) 40 MG tablet Take 1 tablet (40 mg total) by mouth daily. 12/25/14  Yes Reyne Dumas, MD  colchicine 0.6 MG tablet Take 1 tablet (0.6 mg total) by mouth 2 (two) times daily. 12/25/14  Yes Reyne Dumas, MD  diclofenac sodium (VOLTAREN) 1 % GEL Apply 4 g topically 4 (four) times daily. 12/25/14  Yes Reyne Dumas, MD  Ferrous Sulfate (IRON) 325 (65 FE) MG TABS Take 325 mg by mouth 2 (two) times daily. 12/25/14  Yes Reyne Dumas, MD  furosemide (LASIX) 20 MG tablet Take 20 mg by mouth daily as needed for fluid.  11/03/14  Yes Historical Provider, MD  metoprolol tartrate (LOPRESSOR) 25 MG tablet Take 1 tablet (25 mg total) by mouth every morning. 12/25/14  Yes Reyne Dumas, MD  olmesartan (BENICAR) 20 MG tablet Take 1 tablet (20 mg total) by mouth daily. 12/25/14  Yes Reyne Dumas, MD  pantoprazole (PROTONIX) 40 MG tablet Take 1 tablet (40 mg total) by mouth daily. 12/25/14  Yes Reyne Dumas, MD  predniSONE (DELTASONE) 10 MG tablet Take 4 tablets (40 mg total) by mouth daily with breakfast. 12/25/14  Yes Reyne Dumas, MD  HYDROcodone-acetaminophen (NORCO/VICODIN) 5-325 MG per tablet Take 2 tablets by mouth every 4 (four) hours as needed for moderate pain or severe pain. Patient not taking: Reported on 12/20/2014 11/03/14   Starlyn Skeans, PA-C  nitroGLYCERIN (NITROSTAT) 0.4 MG SL tablet  Place 1 tablet (0.4 mg total) under the tongue every 5 (five) minutes as needed for chest pain. 02/26/13   Larey Dresser, MD    Current Facility-Administered Medications  Medication Dose Route Frequency Provider Last Rate Last Dose  . 0.9 %  sodium chloride infusion   Intravenous Continuous Rise Patience, MD 50 mL/hr at 12/26/14 2345 1,000 mL at 12/26/14 2345  . acetaminophen (TYLENOL) tablet 650 mg  650 mg Oral Q6H PRN Rise Patience, MD   650 mg at 12/27/14 1031   Or  . acetaminophen (TYLENOL) suppository 650 mg  650 mg Rectal Q6H PRN Rise Patience, MD      . atorvastatin (LIPITOR) tablet 40 mg  40 mg Oral Daily Rise Patience, MD   40 mg at 12/27/14 1030  .  colchicine tablet 0.6 mg  0.6 mg Oral BID Rise Patience, MD   0.6 mg at 12/27/14 1030  . ferrous sulfate tablet 325 mg  325 mg Oral BID WC Rise Patience, MD   325 mg at 12/27/14 1031  . hydrALAZINE (APRESOLINE) injection 10 mg  10 mg Intravenous Q4H PRN Rise Patience, MD      . metoprolol tartrate (LOPRESSOR) tablet 25 mg  25 mg Oral q morning - 10a Rise Patience, MD   25 mg at 12/27/14 1031  . nitroGLYCERIN (NITROSTAT) SL tablet 0.4 mg  0.4 mg Sublingual Q5 min PRN Rise Patience, MD      . ondansetron [facility]) injection 4 mg  4 mg Intravenous Once Dorie Rank, MD   Stopped at 12/26/14 2002  . ondansetron (ZOFRAN) tablet 4 mg  4 mg Oral Q6H PRN Rise Patience, MD       Or  . ondansetron [facility]) injection 4 mg  4 mg Intravenous Q6H PRN Rise Patience, MD      . pantoprazole (PROTONIX) injection 40 mg  40 mg Intravenous Q24H Belkys A Regalado, MD   40 mg at 12/27/14 1031    Allergies as of 12/26/2014  . (No Known Allergies)    Family History  Problem Relation Age of Onset  . Diabetes Mother   . Coronary artery disease Mother   . Diabetes Sister   . Coronary artery disease Sister     History   Social History  . Marital Status: Widowed    Spouse Name: N/A  . Number  of Children: 5  . Years of Education: 12   Occupational History  . Retired    Social History Main Topics  . Smoking status: Never Smoker   . Smokeless tobacco: Never Used  . Alcohol Use: No  . Drug Use: No  . Sexual Activity: No   Other Topics Concern  . Not on file   Social History Narrative   Widowed. Lives with daughter.   5 living children, 1 deceased.   Retired, used to work in Public relations account executive.   12th grade high school education.   Has Medicaid.          Review of Systems: Pertinent positive and negative review of systems were noted in the above HPI section.  All other review of systems was otherwise negative.Marland Kitchen  Physical Exam: Vital signs in last 24 hours: Temp:  [97.5 F (36.4 C)-98.1 F (36.7 C)] 98 F (36.7 C) (04/16 0759) Pulse Rate:  [65-80] 65 (04/16 0800) Resp:  [13-21] 16 (04/16 0800) BP: (126-187)/(44-85) 126/59 mmHg (04/16 0800) SpO2:  [98 %-100 %] 100 % (04/16 0800) Weight:  [227 lb (102.967 kg)-229 lb 0.9 oz (103.9 kg)] 229 lb 0.9 oz (103.9 kg) (04/16 0419) Last BM Date: 12/26/14 General:   Alert, well-developed, well-nourished, elderly AA female, pleasant and cooperative in NAD Head:  Normocephalic and atraumatic. Eyes:  Sclera clear, no icterus.   Conjunctiva pink. Ears:  Normal auditory acuity. Nose:  No deformity, discharge,  or lesions. Mouth:  No deformity or lesions.   Neck:  Supple; no masses or thyromegaly. Lungs:  Clear throughout to auscultation.   No wheezes, crackles, or rhonchi.  Heart:  Regular rate and rhythm; no murmurs, clicks, rubs,  or gallops. Abdomen:  Soft,nontender, BS active,nonpalp mass or hsm.   Rectal:  Deferred , bloody stool in ER Msk:  Symmetrical without gross deformities. . Pulses:  Normal pulses noted. Extremities:  Without clubbing or edema. Neurologic:  Alert and  oriented x4;  grossly normal neurologically. Skin:  Intact without significant lesions or rashes.. Psych:  Alert and cooperative. Normal mood  and affect.  Intake/Output from previous day: 04/15 0701 - 04/16 0700 In: 400 [I.V.:400] Out: -  Intake/Output this shift: Total I/O In: 100 [I.V.:100] Out: -   Lab Results:  Recent Labs  12/25/14 0434 12/26/14 1815 12/27/14 0315  WBC 6.2 7.6 6.7  HGB 8.6* 8.8* 8.3*  HCT 27.7* 28.6* 26.7*  PLT 210 259 219   BMET  Recent Labs  12/25/14 0434 12/26/14 1815 12/27/14 0315  NA 140 143 144  K 4.1 3.6 3.8  CL 108 111 114*  CO2 21 20 22   GLUCOSE 131* 124* 92  BUN 17 29* 28*  CREATININE 1.37* 1.80* 1.64*  CALCIUM 9.3 9.1 8.5   LFT  Recent Labs  12/26/14 1815  PROT 6.7  ALBUMIN 3.1*  AST 24  ALT 14  ALKPHOS 69  BILITOT 0.4   PT/INR  Recent Labs  12/26/14 1959  LABPROT 14.1  INR 1.08     IMPRESSION:  #1 74 yo female with recurrent diverticular bleeding- just discharged 3 days ago after admission with diverticular bleed. Has diffuse diverticulosis, and had bleeding diverticulum at 40 cm which was endoclipped. No bleeding overnight, hemodynamically stable #2 anemia secondary to acute blood loss #3 stage 3 CKD #4 CAD s/p stent #5 AODM #6 CHF #7 OSA  PLAN: #1 Clear liquid diet #2 supportive management- repeat colon for hemostasis or angiogram if recurrent hemodynamically significant bleeding #3 serial hgb's and transfuse for hgb less than 8 given age and CAD Will follow with you; Dr. Collene Mares will assume care on 4/18.   Amy Esterwood  12/27/2014, 11:26 AM      Attending physician's note   I have taken a history, examined the patient and reviewed the chart. I agree with the Advanced Practitioner's note, impression and recommendations. Recurrent diverticular bleeding with ABL anemia. Bed rest and bowel rest with only clear liquids for now. Transfuse for Hb < 8. Consider repeat colonoscopy or IR mesenteric angiogram for significant, recurrent bleeding. Dr. Verdia Kuba will resume care on Monday.   Ladene Artist, MD Mar[facility] Regal

## 2014-12-27 NOTE — Progress Notes (Signed)
[facility] PROGRESS NOTE  Marissa [name] RXV:[id number] DOB: 04-24-1941 DOA: 12/26/2014 PCP: [name], MD  Assessment/Plan: 1-GI bleed, hematochezia:  Suspect diverticular bleed.  Continue to monitor Hb, transfuse as needed.  This might be old blood.  GI consulted, in case patient required intervention.  Will add Protonix, but this is likely lower GI bleed.  NPO for now.   2-Acute on CKD stage III;  IV fluids. Continue to follow cr trend.  Strict I and O.   3-CAD status post stenting - patient's aspirin is on hold secondary to rectal bleeding. Patient did say that she has some chest discomfort briefly during the rectal bleeding episode. -Chest pain free.  -first troponin negative. Continue to cycle.  -on metoprolol and statins.  -no aspirin in setting of GI bleed.   episode of nonsustained VT; -check Mg level.  -continue with metoprolol.   Gout - on colchicine presently her recent acute gouty attack has subsided.  Hyperlipidemia - on statins.  Code Status: full code.  Family Communication: Care discussed with patient.  Disposition Plan: remain inpatient, step down, observed for further bleeding.    Consultants:  GI. Man/hung. [facility] covering over weekend.   Procedures: Colonoscopy 4-11: Pandiverticulosis with inspissated stool in several of the diverticula; one bleeding diverticulum in the sigmoid colon-2. resolutiion clips applied; tThe examined terminal ileum appeared to be normal  Antibiotics:  none  HPI/Subjective: No further BM since she has been in the step down unit.  Last Bloody stool was last night in the ED.  Relates mild abdominal discomfort. Also complaining of mild headache.   Objective: Filed Vitals:   12/27/14 0419  BP: 143/58  Pulse: 66  Temp: 97.9 F (36.6 C)  Resp: 19    Intake/Output Summary (Last 24 hours) at 12/27/14 0731 Last data filed at 12/27/14 0600  Gross per 24 hour  Intake    400 ml  Output      0 ml  Net     400 ml   Filed Weights   12/26/14 1741 12/26/14 2331 12/27/14 0419  Weight: 102.967 kg (227 lb) 103.9 kg (229 lb 0.9 oz) 103.9 kg (229 lb 0.9 oz)    Exam:   General:  Alert in no distress.   Cardiovascular: S 1, S 2 RRR  Respiratory: CTA  Abdomen: Bs present, soft, nt  Musculoskeletal: no edema.   Neuro; non focal.   Data Reviewed: Basic Metabolic Panel:  Recent Labs Lab 12/23/14 0410 12/24/14 0418 12/25/14 0434 12/26/14 1815 12/27/14 0315  NA 139 138 140 143 144  K 3.7 4.1 4.1 3.6 3.8  CL 107 108 108 111 114*  CO2 26 22 21 20 22   GLUCOSE 95 94 131* 124* 92  BUN 11 12 17  29* 28*  CREATININE 1.49* 1.58* 1.37* 1.80* 1.64*  CALCIUM 8.6 8.9 9.3 9.1 8.5   Liver Function Tests:  Recent Labs Lab 12/20/14 1926 12/25/14 0434 12/26/14 1815  AST 19 16 24   ALT 11 11 14   ALKPHOS 82 77 69  BILITOT 0.4 0.5 0.4  PROT 6.7 7.0 6.7  ALBUMIN 3.2* 3.0* 3.1*    Recent Labs Lab 12/26/14 1815  LIPASE 27   No results for input(s): AMMONIA in the last 168 hours. CBC:  Recent Labs Lab 12/20/14 1926  12/23/14 0410 12/24/14 0418 12/25/14 0434 12/26/14 1815 12/27/14 0315  WBC 8.6  < > 8.0 9.5 6.2 7.6 6.7  NEUTROABS 6.4  --   --   --   --  5.5  --   HGB 8.9*  < > 7.2* 8.3* 8.6* 8.8* 8.3*  HCT 29.4*  < > 23.6* 27.5* 27.7* 28.6* 26.7*  MCV 72.2*  < > 73.3* 75.8* 75.9* 73.5* 74.6*  PLT 301  < > 268 198 210 259 219  < > = values in this interval not displayed. Cardiac Enzymes:  Recent Labs Lab 12/27/14 0540  TROPONINI <0.03   BNP (last 3 results)  Recent Labs  11/02/14 2340  BNP 109.2*    ProBNP (last 3 results) No results for input(s): PROBNP in the last 8760 hours.  CBG:  Recent Labs Lab 12/27/14 0009 12/27/14 0611  GLUCAP 83 87    Recent Results (from the past 240 hour(s))  MRSA PCR Screening     Status: None   Collection Time: 12/20/14 10:39 PM  Result Value Ref Range Status   MRSA by PCR NEGATIVE NEGATIVE Final    Comment:        The  GeneXpert MRSA Assay (FDA approved for NASAL specimens only), is one component of a comprehensive MRSA colonization surveillance program. It is not intended to diagnose MRSA infection nor to guide or monitor treatment for MRSA infections.   MRSA PCR Screening     Status: None   Collection Time: 12/27/14 12:21 AM  Result Value Ref Range Status   MRSA by PCR NEGATIVE NEGATIVE Final    Comment:        The GeneXpert MRSA Assay (FDA approved for NASAL specimens only), is one component of a comprehensive MRSA colonization surveillance program. It is not intended to diagnose MRSA infection nor to guide or monitor treatment for MRSA infections.      Studies: Dg Chest Port 1 View  12/27/2014   CLINICAL DATA:  Chest pain  EXAM: PORTABLE CHEST - 1 VIEW  COMPARISON:  11/02/2014  FINDINGS: Mild cardiomegaly is stable from previous. Aortic and hilar contours are stable and negative. There is no edema, consolidation, effusion, or pneumothorax.  IMPRESSION: No active disease.   Electronically Signed   By: Monte Fantasia M.D.   On: 12/27/2014 05:02    Scheduled Meds: . atorvastatin  40 mg Oral Daily  . colchicine  0.6 mg Oral BID  . ferrous sulfate  325 mg Oral BID WC  . metoprolol tartrate  25 mg Oral q morning - 10a  . ondansetron (ZOFRAN) IV  4 mg Intravenous Once   Continuous Infusions: . 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     Niel Hummer A  [facility] Pager [phone]. If 7PM-7AM, please contact night-coverage at [email or web address], password [facility] 12/27/2014, 7:31 AM  LOS: 1 day

## 2014-12-28 ENCOUNTER — Encounter (HOSPITAL_COMMUNITY): Payer: Self-pay | Admitting: Radiology

## 2014-12-28 ENCOUNTER — Inpatient Hospital Stay (HOSPITAL_COMMUNITY): Payer: Medicare Other

## 2014-12-28 LAB — BASIC METABOLIC PANEL
Anion gap: 10 (ref 5–15)
BUN: 19 mg/dL (ref 6–23)
CHLORIDE: 110 mmol/L (ref 96–112)
CO2: 20 mmol/L (ref 19–32)
CREATININE: 1.44 mg/dL — AB (ref 0.50–1.10)
Calcium: 8.5 mg/dL (ref 8.4–10.5)
GFR calc non Af Amer: 35 mL/min — ABNORMAL LOW (ref >90)
GFR, EST AFRICAN AMERICAN: 40 mL/min — AB (ref >90)
Glucose, Bld: 79 mg/dL (ref 70–99)
Potassium: 3.6 mmol/L (ref 3.5–5.1)
SODIUM: 140 mmol/L (ref 135–145)

## 2014-12-28 LAB — HEMOGLOBIN AND HEMATOCRIT, BLOOD
HCT: 29.4 % — ABNORMAL LOW (ref 36.0–46.0)
HCT: 31.3 % — ABNORMAL LOW (ref 36.0–46.0)
HEMATOCRIT: 32.8 % — AB (ref 36.0–46.0)
Hemoglobin: 10.4 g/dL — ABNORMAL LOW (ref 12.0–15.0)
Hemoglobin: 9 g/dL — ABNORMAL LOW (ref 12.0–15.0)
Hemoglobin: 9.6 g/dL — ABNORMAL LOW (ref 12.0–15.0)

## 2014-12-28 LAB — GLUCOSE, CAPILLARY
GLUCOSE-CAPILLARY: 69 mg/dL — AB (ref 70–99)
Glucose-Capillary: 81 mg/dL (ref 70–99)
Glucose-Capillary: 109 mg/dL — ABNORMAL HIGH (ref 70–99)
Glucose-Capillary: 86 mg/dL (ref 70–99)

## 2014-12-28 LAB — MAGNESIUM: Magnesium: 1.7 mg/dL (ref 1.5–2.5)

## 2014-12-28 MED ORDER — SODIUM CHLORIDE 0.9 % IV SOLN
INTRAVENOUS | Status: DC
  Administered 2014-12-28: 50 mL/h via INTRAVENOUS

## 2014-12-28 MED ORDER — MAGNESIUM OXIDE 400 (241.3 MG) MG PO TABS
200.0000 mg | ORAL_TABLET | Freq: Two times a day (BID) | ORAL | Status: DC
  Administered 2014-12-28 – 2014-12-29 (×3): 200 mg via ORAL
  Filled 2014-12-28 (×4): qty 0.5

## 2014-12-28 NOTE — Progress Notes (Signed)
Patient ID: Marissa [name], female   DOB: Oct 04, 1940, 74 y.o.   MRN: [id number]    Progress Note Covering for Drs. Adriana Mccallum   Subjective  Napping in chair. No c/o - no further bleeding hgb 9.0   Objective   Vital signs in last 24 hours: Temp:  [97.3 F (36.3 C)-98 F (36.7 C)] 97.5 F (36.4 C) (04/17 0748) Pulse Rate:  [52-95] 82 (04/17 0840) Resp:  [11-23] 15 (04/17 0840) BP: (126-189)/(59-81) 148/69 mmHg (04/17 0755) SpO2:  [97 %-100 %] 99 % (04/17 0840) FiO2 (%):  [21 %] 21 % (04/16 2317) Weight:  [229 lb 11.5 oz (104.2 kg)] 229 lb 11.5 oz (104.2 kg) (04/17 0430) Last BM Date: 12/28/14 General:   Older AA female  in NAD Heart:  Regular rate and rhythm; no murmurs Lungs: Respirations even and unlabored, lungs CTA bilaterally Abdomen:  Soft, nontender and nondistended. Normal bowel sounds. Extremities:  Without edema. Neurologic:  Alert and oriented,  grossly normal neurologically. Psych:  Cooperative. Normal mood and affect.  Intake/Output from previous day: 04/16 0701 - 04/17 0700 In: 840 [P.O.:340; I.V.:500] Out: 300 [Urine:300] Intake/Output this shift: Total I/O In: 100 [P.O.:100] Out: -   Lab Results:  Recent Labs  12/26/14 1815 12/27/14 0315  12/27/14 1629 12/27/14 2330 12/28/14 0720  WBC 7.6 6.7  --   --   --   --   HGB 8.8* 8.3*  < > 9.2* 10.4* 9.0*  HCT 28.6* 26.7*  < > 29.3* 32.8* 29.4*  PLT 259 219  --   --   --   --   < > = values in this interval not displayed. BMET  Recent Labs  12/26/14 1815 12/27/14 0315 12/28/14 0250  NA 143 144 140  K 3.6 3.8 3.6  CL 111 114* 110  CO2 20 22 20   GLUCOSE 124* 92 79  BUN 29* 28* 19  CREATININE 1.80* 1.64* 1.44*  CALCIUM 9.1 8.5 8.5   LFT  Recent Labs  12/26/14 1815  PROT 6.7  ALBUMIN 3.1*  AST 24  ALT 14  ALKPHOS 69  BILITOT 0.4   PT/INR  Recent Labs  12/26/14 1959  LABPROT 14.1  INR 1.08    Studies/Results: Ct Head Wo Contrast  12/28/2014   CLINICAL DATA:  Hypertension and  ventricular tachycardia.  EXAM: CT HEAD WITHOUT CONTRAST  TECHNIQUE: Contiguous axial images were obtained from the base of the skull through the vertex without intravenous contrast.  COMPARISON:  01/12/2012  FINDINGS: Stable age related cerebral atrophy, ventriculomegaly and periventricular white matter disease. No extra-axial fluid collections are identified. No CT findings for acute hemispheric infarction or intracranial hemorrhage. No mass lesions. The brainstem and cerebellum are normal.  No acute bony findings. The paranasal sinuses and mastoid air cells are clear. The globes are intact.  IMPRESSION: No acute intracranial findings.   Electronically Signed   By: Marijo Sanes M.D.   On: 12/28/2014 10:04   Dg Chest Port 1 View  12/27/2014   CLINICAL DATA:  Chest pain  EXAM: PORTABLE CHEST - 1 VIEW  COMPARISON:  11/02/2014  FINDINGS: Mild cardiomegaly is stable from previous. Aortic and hilar contours are stable and negative. There is no edema, consolidation, effusion, or pneumothorax.  IMPRESSION: No active disease.   Electronically Signed   By: Monte Fantasia M.D.   On: 12/27/2014 05:02       Assessment / Plan:   #1 74 yo female with acute recurrent diverticular bleed-self limited- pt had  diverticular bleed last week and had endoclips placed. NO bleeding past 24 hours so will advance diet to full liquid  Observe at least another 24 hours- Dr. 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 I agree with the Advanced Practitioner's note, impression and recommendations. No further bleeding. Advance diet today and observe for at least another 24  hours. Drs. Adriana Mccallum to resume care on Monday.   Pricilla Riffle. Fuller Plan, MD [facility]

## 2014-12-28 NOTE — Progress Notes (Signed)
[facility] PROGRESS NOTE  Marissa [name] ULA:[id number] DOB: 04-02-1941 DOA: 12/26/2014 PCP: [name], MD  Assessment/Plan: 1-GI bleed, hematochezia:  Suspect diverticular bleed.  Continue to monitor Hb, transfuse as needed.  This might be old blood.  GI consulted, in case patient required intervention.  Protonix, but this is likely lower GI bleed.  Hb stable. Advance diet.   2-Acute on CKD stage III;  IV fluids. Continue to follow cr trend.  Strict I and O.  Improving.   3-CAD status post stenting - patient's aspirin is on hold secondary to rectal bleeding. Patient did say that she has some chest discomfort briefly during the rectal bleeding episode. -Chest pain free.  -troponin negative.  -on metoprolol and statins.  -no aspirin in setting of GI bleed.   episode of nonsustained VT; -mg replaced.  -continue with metoprolol.   Gout - on colchicine presently her recent acute gouty attack has subsided.  Hyperlipidemia - on statins.  Headaches; check CT head.   Code Status: full code.  Family Communication: Care discussed with patient.  Disposition Plan: transfer to telemetry   Consultants:  GI. Man/hung. [facility] covering over weekend.   Procedures: Colonoscopy 4-11: Pandiverticulosis with inspissated stool in several of the diverticula; one bleeding diverticulum in the sigmoid colon-2. resolutiion clips applied; tThe examined terminal ileum appeared to be normal  Antibiotics:  none  HPI/Subjective: Flat affect, watery dark stool this am.  No abdominal pain.  Relates chronic headaches. Has not had any imaging for that.  She has flat affect  Objective: Filed Vitals:   12/28/14 0755  BP: 148/69  Pulse: 52  Temp:   Resp: 14    Intake/Output Summary (Last 24 hours) at 12/28/14 0853 Last data filed at 12/27/14 2000  Gross per 24 hour  Intake    740 ml  Output    300 ml  Net    440 ml   Filed Weights   12/26/14 2331 12/27/14 0419 12/28/14  0430  Weight: 103.9 kg (229 lb 0.9 oz) 103.9 kg (229 lb 0.9 oz) 104.2 kg (229 lb 11.5 oz)    Exam:   General:  Alert in no distress.   Cardiovascular: S 1, S 2 RRR  Respiratory: CTA  Abdomen: Bs present, soft, nt  Musculoskeletal: no edema.   Neuro; non focal.   Data Reviewed: Basic Metabolic Panel:  Recent Labs Lab 12/24/14 0418 12/25/14 0434 12/26/14 1815 12/27/14 0315 12/27/14 1127 12/28/14 0250  NA 138 140 143 144  --  140  K 4.1 4.1 3.6 3.8  --  3.6  CL 108 108 111 114*  --  110  CO2 22 21 20 22   --  20  GLUCOSE 94 131* 124* 92  --  79  BUN 12 17 29* 28*  --  19  CREATININE 1.58* 1.37* 1.80* 1.64*  --  1.44*  CALCIUM 8.9 9.3 9.1 8.5  --  8.5  MG  --   --   --   --  1.8  --    Liver Function Tests:  Recent Labs Lab 12/25/14 0434 12/26/14 1815  AST 16 24  ALT 11 14  ALKPHOS 77 69  BILITOT 0.5 0.4  PROT 7.0 6.7  ALBUMIN 3.0* 3.1*    Recent Labs Lab 12/26/14 1815  LIPASE 27   No results for input(s): AMMONIA in the last 168 hours. CBC:  Recent Labs Lab 12/23/14 0410 12/24/14 2122 12/25/14 0434 12/26/14 1815 12/27/14 0315 12/27/14 1127 12/27/14 1629 12/27/14 2330  12/28/14 0720  WBC 8.0 9.5 6.2 7.6 6.7  --   --   --   --   NEUTROABS  --   --   --  5.5  --   --   --   --   --   HGB 7.2* 8.3* 8.6* 8.8* 8.3* 8.2* 9.2* 10.4* 9.0*  HCT 23.6* 27.5* 27.7* 28.6* 26.7* 26.9* 29.3* 32.8* 29.4*  MCV 73.3* 75.8* 75.9* 73.5* 74.6*  --   --   --   --   PLT 268 198 210 259 219  --   --   --   --    Cardiac Enzymes:  Recent Labs Lab 12/27/14 0540 12/27/14 1127  TROPONINI <0.03 <0.03   BNP (last 3 results)  Recent Labs  11/02/14 2340  BNP 109.2*    ProBNP (last 3 results) No results for input(s): PROBNP in the last 8760 hours.  CBG:  Recent Labs Lab 12/27/14 0611 12/27/14 1205 12/27/14 1739 12/27/14 2316 12/28/14 0606  GLUCAP 87 86 80 69* 81    Recent Results (from the past 240 hour(s))  MRSA PCR Screening     Status: None    Collection Time: 12/20/14 10:39 PM  Result Value Ref Range Status   MRSA by PCR NEGATIVE NEGATIVE Final    Comment:        The GeneXpert MRSA Assay (FDA approved for NASAL specimens only), is one component of a comprehensive MRSA colonization surveillance program. It is not intended to diagnose MRSA infection nor to guide or monitor treatment for MRSA infections.   MRSA PCR Screening     Status: None   Collection Time: 12/27/14 12:21 AM  Result Value Ref Range Status   MRSA by PCR NEGATIVE NEGATIVE Final    Comment:        The GeneXpert MRSA Assay (FDA approved for NASAL specimens only), is one component of a comprehensive MRSA colonization surveillance program. It is not intended to diagnose MRSA infection nor to guide or monitor treatment for MRSA infections.      Studies: Dg Chest Port 1 View  12/27/2014   CLINICAL DATA:  Chest pain  EXAM: PORTABLE CHEST - 1 VIEW  COMPARISON:  11/02/2014  FINDINGS: Mild cardiomegaly is stable from previous. Aortic and hilar contours are stable and negative. There is no edema, consolidation, effusion, or pneumothorax.  IMPRESSION: No active disease.   Electronically Signed   By: Monte Fantasia M.D.   On: 12/27/2014 05:02    Scheduled Meds: . atorvastatin  40 mg Oral Daily  . colchicine  0.6 mg Oral BID  . ferrous sulfate  325 mg Oral BID WC  . metoprolol tartrate  25 mg Oral q morning - 10a  . ondansetron (ZOFRAN) IV  4 mg Intravenous Once  . pantoprazole (PROTONIX) IV  40 mg Intravenous Q24H   Continuous Infusions: . 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     Niel Hummer A  [facility] Pager [phone]. If 7PM-7AM, please contact night-coverage at [email or web address], password [facility] 12/28/2014, 8:53 AM  LOS:  2 days

## 2014-12-28 NOTE — Progress Notes (Signed)
Report called to Maryruth Hancock, RN on 6E. Vitals stable. Patients belongings with patient. CCMD and ELink notified.

## 2014-12-29 LAB — HEMOGLOBIN AND HEMATOCRIT, BLOOD
HCT: 27.6 % — ABNORMAL LOW (ref 36.0–46.0)
Hemoglobin: 8.5 g/dL — ABNORMAL LOW (ref 12.0–15.0)

## 2014-12-29 LAB — BASIC METABOLIC PANEL
ANION GAP: 8 (ref 5–15)
BUN: 15 mg/dL (ref 6–23)
CALCIUM: 8.6 mg/dL (ref 8.4–10.5)
CHLORIDE: 113 mmol/L — AB (ref 96–112)
CO2: 20 mmol/L (ref 19–32)
CREATININE: 1.37 mg/dL — AB (ref 0.50–1.10)
GFR calc non Af Amer: 37 mL/min — ABNORMAL LOW (ref >90)
GFR, EST AFRICAN AMERICAN: 43 mL/min — AB (ref >90)
Glucose, Bld: 89 mg/dL (ref 70–99)
Potassium: 3.5 mmol/L (ref 3.5–5.1)
SODIUM: 141 mmol/L (ref 135–145)

## 2014-12-29 LAB — GLUCOSE, CAPILLARY
GLUCOSE-CAPILLARY: 91 mg/dL (ref 70–99)
GLUCOSE-CAPILLARY: 93 mg/dL (ref 70–99)
Glucose-Capillary: 97 mg/dL (ref 70–99)
Glucose-Capillary: 79 mg/dL (ref 70–99)

## 2014-12-29 LAB — CLOSTRIDIUM DIFFICILE BY PCR: CDIFFPCR: NEGATIVE

## 2014-12-29 MED ORDER — PANTOPRAZOLE SODIUM 40 MG PO TBEC
40.0000 mg | DELAYED_RELEASE_TABLET | Freq: Every day | ORAL | Status: DC
  Administered 2014-12-29: 40 mg via ORAL
  Filled 2014-12-29: qty 1

## 2014-12-29 NOTE — Progress Notes (Signed)
Utilization review completed. Ryhanna Dunsmore, RN, BSN. 

## 2014-12-29 NOTE — Care Management Note (Signed)
CARE MANAGEMENT NOTE 12/29/2014  Patient:  Marissa [name], Marissa [name]   Account Number:  [id number]  Date Initiated:  12/29/2014  Documentation initiated by:  Jasmine Pang  Subjective/Objective Assessment:   CM following for progression and d/c planning.     Action/Plan:   12/29/14 Met with pt IM given , pt lives with family who are able to assist her and she has walker, wheelchair and cane as needed when her gout flares. No other needs identified.   Anticipated DC Date:  12/29/2014   Anticipated DC Plan:  HOME/SELF CARE         Choice offered to / List presented to:             Status of service:  Completed, signed off Medicare Important Message given?  YES (If response is "NO", the following Medicare IM given date fields will be blank) Date Medicare IM given:  12/29/2014 Medicare IM given by:  Lizbett Garciagarcia Date Additional Medicare IM given:   Additional Medicare IM given by:    Discharge Disposition:  HOME/SELF CARE  Per UR Regulation:    If discussed at Long Length of Stay Meetings, dates discussed:    Comments:

## 2014-12-29 NOTE — Progress Notes (Signed)
[facility] PROGRESS NOTE  Kalis Friese VVO:[id number] DOB: [birth year] DOA: 12/26/2014 PCP: [name], MD  Assessment/Plan: 1-GI bleed, hematochezia:  Suspect diverticular bleed.  Continue to monitor Hb, transfuse as needed.  GI consulted, in case patient required intervention.  Protonix, but this is likely lower GI bleed.  Hb at 8.5 Awaiting for Dr man,. Can patient be discharge home today ? .  C diff negative.   2-Acute on CKD stage III;  IV fluids. Continue to follow cr trend.  Strict I and O.  Improving.   3-CAD status post stenting - patient's aspirin is on hold secondary to rectal bleeding. Patient did say that she has some chest discomfort briefly during the rectal bleeding episode. -Chest pain free.  -troponin negative.  -on metoprolol and statins.  -no aspirin in setting of GI bleed.   episode of nonsustained VT; -mg replaced.  -continue with metoprolol.   Gout - on colchicine presently her recent acute gouty attack has subsided.  Hyperlipidemia - on statins.  Headaches; check CT head.   Code Status: full code.  Family Communication: Care discussed with patient.  Disposition Plan: transfer to telemetry   Consultants:  GI. Man/hung. [facility] covering over weekend.   Procedures: Colonoscopy 4-11: Pandiverticulosis with inspissated stool in several of the diverticula; one bleeding diverticulum in the sigmoid colon-2. resolutiion clips applied; tThe examined terminal ileum appeared to be normal  Antibiotics:  none  HPI/Subjective: Feeling ok. Had watery stool dark. No abdominal pain.  Objective: Filed Vitals:   12/29/14 0924  BP: 159/75  Pulse: 80  Temp: 98.2 F (36.8 C)  Resp: 18    Intake/Output Summary (Last 24 hours) at 12/29/14 1645 Last data filed at 12/29/14 1300  Gross per 24 hour  Intake 2134.58 ml  Output    202 ml  Net 1932.58 ml   Filed Weights   12/27/14 0419 12/28/14 0430 12/29/14 0500  Weight: 103.9 kg (229 lb  0.9 oz) 104.2 kg (229 lb 11.5 oz) 104.2 kg (229 lb 11.5 oz)    Exam:   General:  Alert in no distress.   Cardiovascular: S 1, S 2 RRR  Respiratory: CTA  Abdomen: Bs present, soft, nt  Musculoskeletal: no edema.   Neuro; non focal.   Data Reviewed: Basic Metabolic Panel:  Recent Labs Lab 12/25/14 0434 12/26/14 1815 12/27/14 0315 12/27/14 1127 12/28/14 0250 12/28/14 0930 12/29/14 0552  NA 140 143 144  --  140  --  141  K 4.1 3.6 3.8  --  3.6  --  3.5  CL 108 111 114*  --  110  --  113*  CO2 21 20 22   --  20  --  20  GLUCOSE 131* 124* 92  --  79  --  89  BUN 17 29* 28*  --  19  --  15  CREATININE 1.37* 1.80* 1.64*  --  1.44*  --  1.37*  CALCIUM 9.3 9.1 8.5  --  8.5  --  8.6  MG  --   --   --  1.8  --  1.7  --    Liver Function Tests:  Recent Labs Lab 12/25/14 0434 12/26/14 1815  AST 16 24  ALT 11 14  ALKPHOS 77 69  BILITOT 0.5 0.4  PROT 7.0 6.7  ALBUMIN 3.0* 3.1*    Recent Labs Lab 12/26/14 1815  LIPASE 27   No results for input(s): AMMONIA in the last 168 hours. CBC:  Recent Labs Lab 12/23/14  0410 12/24/14 0418 12/25/14 0434 12/26/14 1815 12/27/14 0315  12/27/14 1629 12/27/14 2330 12/28/14 0720 12/28/14 1821 12/29/14 0756  WBC 8.0 9.5 6.2 7.6 6.7  --   --   --   --   --   --   NEUTROABS  --   --   --  5.5  --   --   --   --   --   --   --   HGB 7.2* 8.3* 8.6* 8.8* 8.3*  < > 9.2* 10.4* 9.0* 9.6* 8.5*  HCT 23.6* 27.5* 27.7* 28.6* 26.7*  < > 29.3* 32.8* 29.4* 31.3* 27.6*  MCV 73.3* 75.8* 75.9* 73.5* 74.6*  --   --   --   --   --   --   PLT 268 198 210 259 219  --   --   --   --   --   --   < > = values in this interval not displayed. Cardiac Enzymes:  Recent Labs Lab 12/27/14 0540 12/27/14 1127  TROPONINI <0.03 <0.03   BNP (last 3 results)  Recent Labs  11/02/14 2340  BNP 109.2*    ProBNP (last 3 results) No results for input(s): PROBNP in the last 8760 hours.  CBG:  Recent Labs Lab 12/28/14 1658 12/29/14 0029  12/29/14 0615 12/29/14 1145 12/29/14 1629  GLUCAP 86 97 91 93 79    Recent Results (from the past 240 hour(s))  MRSA PCR Screening     Status: None   Collection Time: 12/20/14 10:39 PM  Result Value Ref Range Status   MRSA by PCR NEGATIVE NEGATIVE Final    Comment:        The GeneXpert MRSA Assay (FDA approved for NASAL specimens only), is one component of a comprehensive MRSA colonization surveillance program. It is not intended to diagnose MRSA infection nor to guide or monitor treatment for MRSA infections.   MRSA PCR Screening     Status: None   Collection Time: 12/27/14 12:21 AM  Result Value Ref Range Status   MRSA by PCR NEGATIVE NEGATIVE Final    Comment:        The GeneXpert MRSA Assay (FDA approved for NASAL specimens only), is one component of a comprehensive MRSA colonization surveillance program. It is not intended to diagnose MRSA infection nor to guide or monitor treatment for MRSA infections.   Clostridium Difficile by PCR     Status: None   Collection Time: 12/28/14 11:03 PM  Result Value Ref Range Status   C difficile by pcr NEGATIVE NEGATIVE Final     Studies: Ct Head Wo Contrast  12/28/2014   CLINICAL DATA:  Hypertension and ventricular tachycardia.  EXAM: CT HEAD WITHOUT CONTRAST  TECHNIQUE: Contiguous axial images were obtained from the base of the skull through the vertex without intravenous contrast.  COMPARISON:  01/12/2012  FINDINGS: Stable age related cerebral atrophy, ventriculomegaly and periventricular white matter disease. No extra-axial fluid collections are identified. No CT findings for acute hemispheric infarction or intracranial hemorrhage. No mass lesions. The brainstem and cerebellum are normal.  No acute bony findings. The paranasal sinuses and mastoid air cells are clear. The globes are intact.  IMPRESSION: No acute intracranial findings.   Electronically Signed   By: Marijo Sanes M.D.   On: 12/28/2014 10:04    Scheduled  Meds: . atorvastatin  40 mg Oral Daily  . colchicine  0.6 mg Oral BID  . ferrous sulfate  325 mg Oral BID WC  .  magnesium oxide  200 mg Oral BID  . metoprolol tartrate  25 mg Oral q morning - 10a  . ondansetron (ZOFRAN) IV  4 mg Intravenous Once  . pantoprazole  40 mg Oral Daily   Continuous Infusions:    Active Problems:   Gout   Essential hypertension   Chronic kidney disease, stage 3   GI bleed   Gastrointestinal hemorrhage associated with intestinal diverticulosis   Hyperlipidemia   Chest pain    Time spent: 35 minutes.     Niel Hummer A  [facility] Pager [phone]. If 7PM-7AM, please contact night-coverage at [email or web address], password [facility] 12/29/2014, 4:45 PM  LOS: 3 days

## 2014-12-29 NOTE — Progress Notes (Signed)
Unassigned patient Subjective: Since I last evaluated the patient, she has been re-admitted with a recurrent GI bleed.  The bleeding seems to have stopped. She has had a normal BM today. Denies having any abdominal pain. She had a diverticulum clipped omn her last visit.   Objective: Vital signs in last 24 hours: Temp:  [97.7 F (36.5 C)-98.6 F (37 C)] 98.2 F (36.8 C) (04/18 0924) Pulse Rate:  [75-88] 80 (04/18 0924) Resp:  [17-18] 18 (04/18 0924) BP: (135-159)/(75-107) 159/75 mmHg (04/18 0924) SpO2:  [97 %-100 %] 100 % (04/18 0924) Weight:  [104.2 kg (229 lb 11.5 oz)] 104.2 kg (229 lb 11.5 oz) (04/18 0500) Last BM Date: 12/28/14  Intake/Output from previous day: 04/17 0701 - 04/18 0700 In: 2114.6 [P.O.:700; I.V.:1414.6] Out: 202 [Urine:200; Stool:2] Intake/Output this shift: Total I/O In: 360 [P.O.:360] Out: -   General appearance: alert, cooperative, appears stated age and no distress Resp: clear to auscultation bilaterally Cardio: regular rate and rhythm, S1, S2 normal, no murmur, click, rub or gallop GI: soft, non-tender; bowel sounds normal; no masses,  no organomegaly  Lab Results:  Recent Labs  12/26/14 1815 12/27/14 0315  12/28/14 0720 12/28/14 1821 12/29/14 0756  WBC 7.6 6.7  --   --   --   --   HGB 8.8* 8.3*  < > 9.0* 9.6* 8.5*  HCT 28.6* 26.7*  < > 29.4* 31.3* 27.6*  PLT 259 219  --   --   --   --   < > = values in this interval not displayed. BMET  Recent Labs  12/27/14 0315 12/28/14 0250 12/29/14 0552  NA 144 140 141  K 3.8 3.6 3.5  CL 114* 110 113*  CO2 22 20 20   GLUCOSE 92 79 89  BUN 28* 19 15  CREATININE 1.64* 1.44* 1.37*  CALCIUM 8.5 8.5 8.6   LFT  Recent Labs  12/26/14 1815  PROT 6.7  ALBUMIN 3.1*  AST 24  ALT 14  ALKPHOS 69  BILITOT 0.4   PT/INR  Recent Labs  12/26/14 1959  LABPROT 14.1  INR 1.08   Studies/Results: Ct Head Wo Contrast  12/28/2014   CLINICAL DATA:  Hypertension and ventricular tachycardia.  EXAM:  CT HEAD WITHOUT CONTRAST  TECHNIQUE: Contiguous axial images were obtained from the base of the skull through the vertex without intravenous contrast.  COMPARISON:  01/12/2012  FINDINGS: Stable age related cerebral atrophy, ventriculomegaly and periventricular white matter disease. No extra-axial fluid collections are identified. No CT findings for acute hemispheric infarction or intracranial hemorrhage. No mass lesions. The brainstem and cerebellum are normal.  No acute bony findings. The paranasal sinuses and mastoid air cells are clear. The globes are intact.  IMPRESSION: No acute intracranial findings.   Electronically Signed   By: Marijo Sanes M.D.   On: 12/28/2014 10:04   Medications: I have reviewed the patient's current medications.  Assessment/Plan: 1) Anemia s/p lower GI bleed thought to be diverticular bleed. Patient has been advised to have BM's on a daily basis and increase her fluid and fiber in her diet as she has extensive diverticulosis. She can follow up with me on an OP basis. She should avoid the use of all NSAIDS for now. OK from a GI standpoint to be discharged today.  2) GERD on PPI's.  LOS: 3 days   Rei Medlen 12/29/2014, 1:53 PM

## 2014-12-29 NOTE — Discharge Summary (Signed)
Physician Discharge Summary  Marissa [name] IBB:[id number] DOB: 1941-01-03 DOA: 12/26/2014  PCP: [name], MD  Admit date: 12/26/2014 Discharge date: 12/29/2014  Time spent: 35 minutes  Recommendations for Outpatient Follow-up:  1. Needs b-met to follow renal function 2. Needs cbc to follow hb  Discharge Diagnoses:    Gastrointestinal hemorrhage associated with intestinal diverticulosis   Gout   Essential hypertension   Chronic kidney disease, stage 3   GI bleed   Hyperlipidemia   Chest pain   Discharge Condition: Stable  Diet recommendation: heart healthy  Filed Weights   12/27/14 0419 12/28/14 0430 12/29/14 0500  Weight: 103.9 kg (229 lb 0.9 oz) 104.2 kg (229 lb 11.5 oz) 104.2 kg (229 lb 11.5 oz)    History of present illness:  Marissa [name] is a 74 y.o. female with history of CAD status post stenting, hypertension, hyperlipidemia, gout, chronic kidney disease who was admitted recently for rectal bleeding and was discharged home yesterday presents to the ER because patient had another episode of rectal bleeding at home this evening. Patient denies any nausea vomiting or abdominal pain. In the ER patient had at least 2 more episode of rectal bleeding. Patient also had brief nonsustained V. tach in the ER. Patient stated that when she had rectal bleeding she had some chest discomfort. Presently chest pain-free. EKG is unremarkable and troponins were negative. Patient's hemoglobin is at baseline when she was discharged yesterday. Patient during the recent stay had colonoscopy which showed pandiverticulosis and was instructed to stop her aspirin for 2 more weeks. Also during the recent stay patient had gout attack for which colchicine was given and presently her gout has subsided. Patient will be admitted for further management of her recurrent GI bleed. Patient is presently hemodynamically stable.   Hospital Course:  1-GI bleed, hematochezia:  Suspect diverticular bleed. last  admission she had colonoscopy with clips of diverticulai Continue to monitor Hb, transfuse as needed.  GI consulted, in case patient required intervention.  Protonix, but this is likely lower GI bleed.  Hb at 8.5, last admission her hb was around 8. No further bleed.  Ok to discharge home today.  C diff negative.   2-Acute on CKD stage III;  IV fluids. Continue to follow cr trend.  Strict I and O.  Improving. Resume lasix at discharge. Hold ACE.   3-CAD status post stenting - patient's aspirin is on hold secondary to rectal bleeding. Patient did say that she has some chest discomfort briefly during the rectal bleeding episode. -Chest pain free.  -troponin negative.  -on metoprolol and statins.  -no aspirin in setting of GI bleed.   episode of nonsustained VT; -mg replaced.  -continue with metoprolol.   Gout - on colchicine presently her recent acute gouty attack has subsided.  Hyperlipidemia - on statins.  Headaches; CT head negative.   Procedures:  none  Consultations:  Dr Man  Discharge Exam: Filed Vitals:   12/29/14 0924  BP: 159/75  Pulse: 80  Temp: 98.2 F (36.8 C)  Resp: 18    General: Alert in no distress.  Cardiovascular: S 1, S 2 RRR Respiratory: CTA  Discharge Instructions   Discharge Instructions    Diet - low sodium heart healthy    Complete by:  As directed      Increase activity slowly    Complete by:  As directed           Current Discharge Medication List    CONTINUE these medications which have  NOT CHANGED   Details  amLODipine (NORVASC) 10 MG tablet Take 1 tablet (10 mg total) by mouth daily. Qty: 30 tablet, Refills: 11   Associated Diagnoses: Atherosclerosis of native coronary artery of native heart without angina pectoris; Unspecified essential hypertension    atorvastatin (LIPITOR) 40 MG tablet Take 1 tablet (40 mg total) by mouth daily. Qty: 30 tablet, Refills: 0    colchicine 0.6 MG tablet Take 1 tablet (0.6 mg  total) by mouth 2 (two) times daily. Qty: 30 tablet, Refills: 0    diclofenac sodium (VOLTAREN) 1 % GEL Apply 4 g topically 4 (four) times daily. Qty: 100 g, Refills: 0    Ferrous Sulfate (IRON) 325 (65 FE) MG TABS Take 325 mg by mouth 2 (two) times daily. Qty: 60 each, Refills: 0    furosemide (LASIX) 20 MG tablet Take 20 mg by mouth daily as needed for fluid.     metoprolol tartrate (LOPRESSOR) 25 MG tablet Take 1 tablet (25 mg total) by mouth every morning. Qty: 30 tablet, Refills: 1    pantoprazole (PROTONIX) 40 MG tablet Take 1 tablet (40 mg total) by mouth daily. Qty: 30 tablet, Refills: 2    nitroGLYCERIN (NITROSTAT) 0.4 MG SL tablet Place 1 tablet (0.4 mg total) under the tongue every 5 (five) minutes as needed for chest pain. Qty: 100 tablet, Refills: 3   Associated Diagnoses: Dyspnea; Coronary atherosclerosis of native coronary artery; Other and unspecified hyperlipidemia      STOP taking these medications     olmesartan (BENICAR) 20 MG tablet      predniSONE (DELTASONE) 10 MG tablet      HYDROcodone-acetaminophen (NORCO/VICODIN) 5-325 MG per tablet        No Known Allergies Follow-up Information    Follow up with [name], MD In 1 week.   Specialty:  Internal Medicine   Contact information:   [address] [address] [phone]       Follow up with Juanita Craver, MD In 1 week.   Specialty:  Gastroenterology   Contact information:   [address], [place] [address] [phone]        The results of significant diagnostics from this hospitalization (including imaging, microbiology, ancillary and laboratory) are listed below for reference.    Significant Diagnostic Studies: Ct Head Wo Contrast  12/28/2014   CLINICAL DATA:  Hypertension and ventricular tachycardia.  EXAM: CT HEAD WITHOUT CONTRAST  TECHNIQUE: Contiguous axial images were obtained from the base of the skull through the vertex without  intravenous contrast.  COMPARISON:  01/12/2012  FINDINGS: Stable age related cerebral atrophy, ventriculomegaly and periventricular white matter disease. No extra-axial fluid collections are identified. No CT findings for acute hemispheric infarction or intracranial hemorrhage. No mass lesions. The brainstem and cerebellum are normal.  No acute bony findings. The paranasal sinuses and mastoid air cells are clear. The globes are intact.  IMPRESSION: No acute intracranial findings.   Electronically Signed   By: Marijo Sanes M.D.   On: 12/28/2014 10:04   Dg Chest Port 1 View  12/27/2014   CLINICAL DATA:  Chest pain  EXAM: PORTABLE CHEST - 1 VIEW  COMPARISON:  11/02/2014  FINDINGS: Mild cardiomegaly is stable from previous. Aortic and hilar contours are stable and negative. There is no edema, consolidation, effusion, or pneumothorax.  IMPRESSION: No active disease.   Electronically Signed   By: Monte Fantasia M.D.   On: 12/27/2014 05:02    Microbiology:  Recent Results (from the past 240 hour(s))  MRSA PCR Screening     Status: None   Collection Time: 12/20/14 10:39 PM  Result Value Ref Range Status   MRSA by PCR NEGATIVE NEGATIVE Final    Comment:        The GeneXpert MRSA Assay (FDA approved for NASAL specimens only), is one component of a comprehensive MRSA colonization surveillance program. It is not intended to diagnose MRSA infection nor to guide or monitor treatment for MRSA infections.   MRSA PCR Screening     Status: None   Collection Time: 12/27/14 12:21 AM  Result Value Ref Range Status   MRSA by PCR NEGATIVE NEGATIVE Final    Comment:        The GeneXpert MRSA Assay (FDA approved for NASAL specimens only), is one component of a comprehensive MRSA colonization surveillance program. It is not intended to diagnose MRSA infection nor to guide or monitor treatment for MRSA infections.   Clostridium Difficile by PCR     Status: None   Collection Time: 12/28/14 11:03 PM   Result Value Ref Range Status   C difficile by pcr NEGATIVE NEGATIVE Final     Labs: Basic Metabolic Panel:  Recent Labs Lab 12/25/14 0434 12/26/14 1815 12/27/14 0315 12/27/14 1127 12/28/14 0250 12/28/14 0930 12/29/14 0552  NA 140 143 144  --  140  --  141  K 4.1 3.6 3.8  --  3.6  --  3.5  CL 108 111 114*  --  110  --  113*  CO2 $Re'21 20 22  'DTT$ --  20  --  20  GLUCOSE 131* 124* 92  --  79  --  89  BUN 17 29* 28*  --  19  --  15  CREATININE 1.37* 1.80* 1.64*  --  1.44*  --  1.37*  CALCIUM 9.3 9.1 8.5  --  8.5  --  8.6  MG  --   --   --  1.8  --  1.7  --    Liver Function Tests:  Recent Labs Lab 12/25/14 0434 12/26/14 1815  AST 16 24  ALT 11 14  ALKPHOS 77 69  BILITOT 0.5 0.4  PROT 7.0 6.7  ALBUMIN 3.0* 3.1*    Recent Labs Lab 12/26/14 1815  LIPASE 27   No results for input(s): AMMONIA in the last 168 hours. CBC:  Recent Labs Lab 12/23/14 0410 12/24/14 0418 12/25/14 0434 12/26/14 1815 12/27/14 0315  12/27/14 1629 12/27/14 2330 12/28/14 0720 12/28/14 1821 12/29/14 0756  WBC 8.0 9.5 6.2 7.6 6.7  --   --   --   --   --   --   NEUTROABS  --   --   --  5.5  --   --   --   --   --   --   --   HGB 7.2* 8.3* 8.6* 8.8* 8.3*  < > 9.2* 10.4* 9.0* 9.6* 8.5*  HCT 23.6* 27.5* 27.7* 28.6* 26.7*  < > 29.3* 32.8* 29.4* 31.3* 27.6*  MCV 73.3* 75.8* 75.9* 73.5* 74.6*  --   --   --   --   --   --   PLT 268 198 210 259 219  --   --   --   --   --   --   < > = values in this interval not displayed. Cardiac Enzymes:  Recent Labs Lab 12/27/14 0540 12/27/14 1127  TROPONINI <0.03 <0.03  BNP: BNP (last 3 results)  Recent Labs  11/02/14 2340  BNP 109.2*    ProBNP (last 3 results) No results for input(s): PROBNP in the last 8760 hours.  CBG:  Recent Labs Lab 12/28/14 1658 12/29/14 0029 12/29/14 0615 12/29/14 1145 12/29/14 1629  GLUCAP 86 97 91 93 79       Signed:  Erick Murin A  [facility] 12/29/2014, 5:28 PM

## 2014-12-30 ENCOUNTER — Inpatient Hospital Stay: Payer: Medicare Other | Admitting: Internal Medicine

## 2014-12-30 NOTE — Progress Notes (Signed)
Patient discharge teaching given, including activity, diet, follow-up appoints, and medications. Patient verbalized understanding of all discharge instructions. IV access was d/c'd. Vitals are stable. Skin is intact except as charted in most recent assessments. Pt to be escorted out by NT, to be driven home by family.  Anthonny Schiller, MBA, BS, RN 

## 2014-12-31 ENCOUNTER — Telehealth: Payer: Self-pay | Admitting: *Deleted

## 2014-12-31 NOTE — Telephone Encounter (Signed)
Called pt concerning TCM appt no answer LMOM RTC.../lmb 

## 2015-01-01 NOTE — Telephone Encounter (Signed)
Called pt/daufghter again still no answer LMOM RTC ASAP...Marissa [name]

## 2015-01-02 NOTE — Telephone Encounter (Signed)
Transition Care Management Follow-up Telephone Call D/C 12/29/14   How have you been since you were released from the hospital? Pt states she doing alright   Do you understand why you were in the hospital? YES   Do you understand the discharge instrcutions? YES  Items Reviewed:  Medications reviewed: YES  Allergies reviewed: YES  Dietary changes reviewed: YES, heart healthy  Referrals reviewed: YES, pt states her daughter usually make her appt haven't had a change to make appt with Dr. Collene Mares because she had surgery. Inform pt when she see md next week if she was needing referral they can make appt for her   Functional Questionnaire:   Activities of Daily Living (ADLs):   She states she are independent in the following: ambulation, bathing and hygiene, feeding, continence, grooming, toileting and dressing States she require assistance with the following: ambulation sometimes   Any transportation issues/concerns?: NO   Any patient concerns? NO   Confirmed importance and date/time of follow-up visits scheduled: YES, made appt for 01/07/15 w/Dr. Ronnald Ramp   Confirmed with patient if condition begins to worsen call PCP or go to the ER.

## 2015-01-07 ENCOUNTER — Ambulatory Visit (INDEPENDENT_AMBULATORY_CARE_PROVIDER_SITE_OTHER): Payer: Medicare Other | Admitting: Internal Medicine

## 2015-01-07 ENCOUNTER — Other Ambulatory Visit (INDEPENDENT_AMBULATORY_CARE_PROVIDER_SITE_OTHER): Payer: Medicare Other

## 2015-01-07 ENCOUNTER — Encounter: Payer: Self-pay | Admitting: Internal Medicine

## 2015-01-07 VITALS — BP 112/68 | HR 72 | Temp 97.5°F | Resp 16 | Ht 64.0 in | Wt 216.0 lb

## 2015-01-07 DIAGNOSIS — I10 Essential (primary) hypertension: Secondary | ICD-10-CM

## 2015-01-07 DIAGNOSIS — Z23 Encounter for immunization: Secondary | ICD-10-CM

## 2015-01-07 DIAGNOSIS — N183 Chronic kidney disease, stage 3 unspecified: Secondary | ICD-10-CM

## 2015-01-07 DIAGNOSIS — D529 Folate deficiency anemia, unspecified: Secondary | ICD-10-CM

## 2015-01-07 DIAGNOSIS — R197 Diarrhea, unspecified: Secondary | ICD-10-CM | POA: Diagnosis not present

## 2015-01-07 DIAGNOSIS — D62 Acute posthemorrhagic anemia: Secondary | ICD-10-CM

## 2015-01-07 LAB — CBC WITH DIFFERENTIAL/PLATELET
BASOS PCT: 0.2 % (ref 0.0–3.0)
Basophils Absolute: 0 10*3/uL (ref 0.0–0.1)
EOS PCT: 1.9 % (ref 0.0–5.0)
Eosinophils Absolute: 0.1 10*3/uL (ref 0.0–0.7)
HCT: 30.2 % — ABNORMAL LOW (ref 36.0–46.0)
HEMOGLOBIN: 9.6 g/dL — AB (ref 12.0–15.0)
LYMPHS PCT: 15.1 % (ref 12.0–46.0)
Lymphs Abs: 1 10*3/uL (ref 0.7–4.0)
MCHC: 31.9 g/dL (ref 30.0–36.0)
MCV: 70.5 fl — ABNORMAL LOW (ref 78.0–100.0)
MONOS PCT: 6.1 % (ref 3.0–12.0)
Monocytes Absolute: 0.4 10*3/uL (ref 0.1–1.0)
NEUTROS ABS: 5 10*3/uL (ref 1.4–7.7)
Neutrophils Relative %: 76.7 % (ref 43.0–77.0)
Platelets: 303 10*3/uL (ref 150.0–400.0)
RBC: 4.28 Mil/uL (ref 3.87–5.11)
RDW: 16 % — AB (ref 11.5–15.5)
WBC: 6.5 10*3/uL (ref 4.0–10.5)

## 2015-01-07 LAB — BASIC METABOLIC PANEL
BUN: 31 mg/dL — ABNORMAL HIGH (ref 6–23)
CHLORIDE: 114 meq/L — AB (ref 96–112)
CO2: 15 meq/L — AB (ref 19–32)
Calcium: 10 mg/dL (ref 8.4–10.5)
Creatinine, Ser: 2.69 mg/dL — ABNORMAL HIGH (ref 0.40–1.20)
GFR: 22.24 mL/min — ABNORMAL LOW (ref >60.00)
GLUCOSE: 101 mg/dL — AB (ref 70–99)
Potassium: 4.6 mEq/L (ref 3.5–5.1)
SODIUM: 136 meq/L (ref 135–145)

## 2015-01-07 LAB — IBC PANEL
Iron: 47 ug/dL (ref 42–145)
Saturation Ratios: 14.3 % — ABNORMAL LOW (ref 20.0–50.0)
TRANSFERRIN: 234 mg/dL (ref 212.0–360.0)

## 2015-01-07 LAB — VITAMIN B12: Vitamin B-12: 263 pg/mL (ref 211–911)

## 2015-01-07 LAB — FERRITIN: Ferritin: 87.1 ng/mL (ref 10.0–291.0)

## 2015-01-07 LAB — FOLATE: FOLATE: 6.1 ng/mL (ref >5.9)

## 2015-01-07 MED ORDER — CYANOCOBALAMIN 2000 MCG PO TABS: 2000.0000 ug | ORAL_TABLET | Freq: Every day | ORAL | Status: DC

## 2015-01-07 NOTE — Assessment & Plan Note (Signed)
She does not appear toxic Will check her stool for c diff infection and will screen for other types of infection with a lactoferrin test

## 2015-01-07 NOTE — Assessment & Plan Note (Signed)
She has had a rather significant decrease in her renal function since her discharge This appears to be a pre-renal issue, perhaps dehydration from the diarrhea Will recommend that she try to hydrate better at home Will bring her back in for a recheck on the renal function Will re-admit if this does not improve her renal function

## 2015-01-07 NOTE — Progress Notes (Signed)
Subjective:    Patient ID: Marissa [name], female    DOB: [birth year], 74 y.o.   MRN: [id number]  Anemia Presents for follow-up visit. Symptoms include confusion, light-headedness and malaise/fatigue. There has been no abdominal pain, anorexia, bruising/bleeding easily, fever, leg swelling, pallor, palpitations, paresthesias, pica or weight loss. Signs of blood loss that are not present include hematemesis, hematochezia and melena. Past treatments include oral iron supplements. Procedure history includes colonoscopy. There are no compliance problems.       Review of Systems  Constitutional: Positive for malaise/fatigue and fatigue. Negative for fever, chills, weight loss, diaphoresis and appetite change.  HENT: Negative.   Eyes: Negative.   Respiratory: Negative.   Cardiovascular: Negative.  Negative for chest pain, palpitations and leg swelling.  Gastrointestinal: Positive for diarrhea. Negative for nausea, vomiting, abdominal pain, constipation, blood in stool, melena, hematochezia, abdominal distention, anal bleeding, rectal pain, anorexia and hematemesis.       She has developed watery diarrhea over the last few days - there has not been any blood in the stool - she has tried imodium with some relief.  Endocrine: Negative.   Genitourinary: Negative.   Musculoskeletal: Negative.  Negative for myalgias, back pain, joint swelling and arthralgias.  Skin: Negative.  Negative for pallor.  Allergic/Immunologic: Negative.   Neurological: Positive for dizziness, weakness and light-headedness. Negative for tremors, seizures, syncope, facial asymmetry, speech difficulty, numbness, headaches and paresthesias.  Hematological: Negative.  Negative for adenopathy. Does not bruise/bleed easily.  Psychiatric/Behavioral: Positive for confusion.       Objective:   Physical Exam  Constitutional: She is oriented to person, place, and time. She appears well-developed and well-nourished.  Non-toxic  appearance. She does not have a sickly appearance. She does not appear ill. No distress.  HENT:  Head: Normocephalic and atraumatic.  Mouth/Throat: Oropharynx is clear and moist. Mucous membranes are pale, not dry and not cyanotic. No oral lesions. No trismus in the jaw. No uvula swelling. No oropharyngeal exudate, posterior oropharyngeal edema, posterior oropharyngeal erythema or tonsillar abscesses.  Eyes: Conjunctivae are normal. Right eye exhibits no discharge. Left eye exhibits no discharge. No scleral icterus.  Neck: Normal range of motion. Neck supple. No JVD present. No tracheal deviation present. No thyromegaly present.  Cardiovascular: Normal rate, regular rhythm, normal heart sounds and intact distal pulses.  Exam reveals no gallop and no friction rub.   No murmur heard. Pulmonary/Chest: Effort normal and breath sounds normal. No stridor. No respiratory distress. She has no wheezes. She has no rales. She exhibits no tenderness.  Abdominal: Soft. Bowel sounds are normal. She exhibits no distension and no mass. There is no tenderness. There is no rebound and no guarding.  Musculoskeletal: Normal range of motion. She exhibits no edema or tenderness.  Lymphadenopathy:    She has no cervical adenopathy.  Neurological: She is oriented to person, place, and time.  Skin: Skin is warm and dry. No rash noted. She is not diaphoretic. No erythema. No pallor.  Psychiatric: She has a normal mood and affect. Her behavior is normal. Judgment and thought content normal.  Vitals reviewed.    Lab Results  Component Value Date   WBC 6.7 12/27/2014   HGB 8.5* 12/29/2014   HCT 27.6* 12/29/2014   PLT 219 12/27/2014   GLUCOSE 89 12/29/2014   CHOL 209* 05/29/2014   TRIG 114.0 05/29/2014   HDL 51.30 05/29/2014   LDLCALC 135* 05/29/2014   ALT 14 12/26/2014   AST 24 12/26/2014   NA 141  12/29/2014   K 3.5 12/29/2014   CL 113* 12/29/2014   CREATININE 1.37* 12/29/2014   BUN 15 12/29/2014   CO2 20  12/29/2014   TSH 2.400 12/21/2014   INR 1.08 12/26/2014   HGBA1C 5.8 11/23/2011   MICROALBUR 0.50 04/03/2009       Assessment & Plan:

## 2015-01-07 NOTE — Patient Instructions (Signed)

## 2015-01-07 NOTE — Progress Notes (Signed)
Pre visit review using our clinic review tool, if applicable. No additional management support is needed unless otherwise documented below in the visit note. 

## 2015-01-07 NOTE — Assessment & Plan Note (Signed)
She has not had anymore bleeding Her H and H have improved Vitamin levels are stable though her B12 level is borderline low - will start an oral B12 replacement

## 2015-01-08 ENCOUNTER — Telehealth: Payer: Self-pay | Admitting: Internal Medicine

## 2015-01-08 NOTE — Telephone Encounter (Signed)
Best number to reach pt is at [phone] for lab results

## 2015-01-08 NOTE — Telephone Encounter (Signed)
Spoke with patient who is in agreement with MD advisement of returning to hospital for fluids. I advised that I would notify MD and see if there is anything else to do other than just report to ED.

## 2015-01-09 ENCOUNTER — Inpatient Hospital Stay (HOSPITAL_COMMUNITY)
Admission: EM | Admit: 2015-01-09 | Discharge: 2015-01-15 | DRG: 682 | Disposition: A | Discharge status: Home / Self Care | Payer: Medicare Other | Attending: Internal Medicine | Admitting: Internal Medicine

## 2015-01-09 ENCOUNTER — Encounter (HOSPITAL_COMMUNITY): Payer: Self-pay | Admitting: *Deleted

## 2015-01-09 ENCOUNTER — Inpatient Hospital Stay (HOSPITAL_COMMUNITY): Payer: Medicare Other

## 2015-01-09 DIAGNOSIS — B961 Klebsiella pneumoniae [K. pneumoniae] as the cause of diseases classified elsewhere: Secondary | ICD-10-CM | POA: Diagnosis present

## 2015-01-09 DIAGNOSIS — M130 Polyarthritis, unspecified: Secondary | ICD-10-CM | POA: Diagnosis present

## 2015-01-09 DIAGNOSIS — I959 Hypotension, unspecified: Secondary | ICD-10-CM | POA: Diagnosis present

## 2015-01-09 DIAGNOSIS — I129 Hypertensive chronic kidney disease with stage 1 through stage 4 chronic kidney disease, or unspecified chronic kidney disease: Secondary | ICD-10-CM | POA: Diagnosis present

## 2015-01-09 DIAGNOSIS — N179 Acute kidney failure, unspecified: Principal | ICD-10-CM | POA: Diagnosis present

## 2015-01-09 DIAGNOSIS — E872 Acidosis: Secondary | ICD-10-CM | POA: Diagnosis present

## 2015-01-09 DIAGNOSIS — I2510 Atherosclerotic heart disease of native coronary artery without angina pectoris: Secondary | ICD-10-CM | POA: Diagnosis present

## 2015-01-09 DIAGNOSIS — I5032 Chronic diastolic (congestive) heart failure: Secondary | ICD-10-CM | POA: Diagnosis present

## 2015-01-09 DIAGNOSIS — N950 Postmenopausal bleeding: Secondary | ICD-10-CM | POA: Diagnosis present

## 2015-01-09 DIAGNOSIS — K210 Gastro-esophageal reflux disease with esophagitis: Secondary | ICD-10-CM

## 2015-01-09 DIAGNOSIS — K219 Gastro-esophageal reflux disease without esophagitis: Secondary | ICD-10-CM | POA: Diagnosis present

## 2015-01-09 DIAGNOSIS — E785 Hyperlipidemia, unspecified: Secondary | ICD-10-CM | POA: Diagnosis present

## 2015-01-09 DIAGNOSIS — Z86718 Personal history of other venous thrombosis and embolism: Secondary | ICD-10-CM

## 2015-01-09 DIAGNOSIS — Z955 Presence of coronary angioplasty implant and graft: Secondary | ICD-10-CM

## 2015-01-09 DIAGNOSIS — G629 Polyneuropathy, unspecified: Secondary | ICD-10-CM | POA: Diagnosis present

## 2015-01-09 DIAGNOSIS — N183 Chronic kidney disease, stage 3 unspecified: Secondary | ICD-10-CM | POA: Diagnosis present

## 2015-01-09 DIAGNOSIS — E860 Dehydration: Secondary | ICD-10-CM | POA: Diagnosis present

## 2015-01-09 DIAGNOSIS — K2971 Gastritis, unspecified, with bleeding: Secondary | ICD-10-CM | POA: Diagnosis present

## 2015-01-09 DIAGNOSIS — D62 Acute posthemorrhagic anemia: Secondary | ICD-10-CM | POA: Diagnosis present

## 2015-01-09 DIAGNOSIS — K5731 Diverticulosis of large intestine without perforation or abscess with bleeding: Secondary | ICD-10-CM | POA: Diagnosis present

## 2015-01-09 DIAGNOSIS — Z86010 Personal history of colonic polyps: Secondary | ICD-10-CM

## 2015-01-09 DIAGNOSIS — G4733 Obstructive sleep apnea (adult) (pediatric): Secondary | ICD-10-CM | POA: Diagnosis present

## 2015-01-09 DIAGNOSIS — E119 Type 2 diabetes mellitus without complications: Secondary | ICD-10-CM | POA: Diagnosis present

## 2015-01-09 DIAGNOSIS — K449 Diaphragmatic hernia without obstruction or gangrene: Secondary | ICD-10-CM | POA: Diagnosis present

## 2015-01-09 DIAGNOSIS — D509 Iron deficiency anemia, unspecified: Secondary | ICD-10-CM | POA: Diagnosis present

## 2015-01-09 DIAGNOSIS — M109 Gout, unspecified: Secondary | ICD-10-CM | POA: Diagnosis present

## 2015-01-09 DIAGNOSIS — N390 Urinary tract infection, site not specified: Secondary | ICD-10-CM | POA: Diagnosis present

## 2015-01-09 DIAGNOSIS — Z8719 Personal history of other diseases of the digestive system: Secondary | ICD-10-CM

## 2015-01-09 DIAGNOSIS — I10 Essential (primary) hypertension: Secondary | ICD-10-CM | POA: Diagnosis present

## 2015-01-09 LAB — URINALYSIS, ROUTINE W REFLEX MICROSCOPIC
Bilirubin Urine: NEGATIVE
GLUCOSE, UA: NEGATIVE mg/dL
KETONES UR: NEGATIVE mg/dL
Nitrite: POSITIVE — AB
PROTEIN: NEGATIVE mg/dL
SPECIFIC GRAVITY, URINE: 1.009 (ref 1.005–1.030)
UROBILINOGEN UA: 0.2 mg/dL (ref 0.0–1.0)
pH: 5 (ref 5.0–8.0)

## 2015-01-09 LAB — COMPREHENSIVE METABOLIC PANEL
ALT: 14 U/L (ref 0–35)
AST: 19 U/L (ref 0–37)
Albumin: 3.9 g/dL (ref 3.5–5.2)
Alkaline Phosphatase: 127 U/L — ABNORMAL HIGH (ref 39–117)
Anion gap: 10 (ref 5–15)
BILIRUBIN TOTAL: 0.5 mg/dL (ref 0.3–1.2)
BUN: 44 mg/dL — ABNORMAL HIGH (ref 6–23)
CALCIUM: 9.7 mg/dL (ref 8.4–10.5)
CO2: 13 mmol/L — ABNORMAL LOW (ref 19–32)
CREATININE: 3.38 mg/dL — AB (ref 0.50–1.10)
Chloride: 118 mmol/L — ABNORMAL HIGH (ref 96–112)
GFR calc Af Amer: 14 mL/min — ABNORMAL LOW (ref >90)
GFR calc non Af Amer: 12 mL/min — ABNORMAL LOW (ref >90)
Glucose, Bld: 94 mg/dL (ref 70–99)
Potassium: 4.6 mmol/L (ref 3.5–5.1)
SODIUM: 141 mmol/L (ref 135–145)
TOTAL PROTEIN: 7.8 g/dL (ref 6.0–8.3)

## 2015-01-09 LAB — I-STAT VENOUS BLOOD GAS, ED
Acid-base deficit: 14 mmol/L — ABNORMAL HIGH (ref 0.0–2.0)
Bicarbonate: 13.1 mEq/L — ABNORMAL LOW (ref 20.0–24.0)
O2 SAT: 38 %
PO2 VEN: 27 mmHg — AB (ref 30.0–45.0)
TCO2: 14 mmol/L (ref 0–100)
pCO2, Ven: 34.8 mmHg — ABNORMAL LOW (ref 45.0–50.0)
pH, Ven: 7.184 — CL (ref 7.250–7.300)

## 2015-01-09 LAB — URINE MICROSCOPIC-ADD ON

## 2015-01-09 LAB — HEMOGLOBIN AND HEMATOCRIT, BLOOD
HEMATOCRIT: 28.5 % — AB (ref 36.0–46.0)
HEMOGLOBIN: 8.8 g/dL — AB (ref 12.0–15.0)

## 2015-01-09 LAB — CBC
HCT: 32.1 % — ABNORMAL LOW (ref 36.0–46.0)
Hemoglobin: 10 g/dL — ABNORMAL LOW (ref 12.0–15.0)
MCH: 22.7 pg — AB (ref 26.0–34.0)
MCHC: 31.2 g/dL (ref 30.0–36.0)
MCV: 73 fL — ABNORMAL LOW (ref 78.0–100.0)
Platelets: 298 10*3/uL (ref 150–400)
RBC: 4.4 MIL/uL (ref 3.87–5.11)
RDW: 15.6 % — AB (ref 11.5–15.5)
WBC: 6.4 10*3/uL (ref 4.0–10.5)

## 2015-01-09 LAB — POC OCCULT BLOOD, ED: FECAL OCCULT BLD: POSITIVE — AB

## 2015-01-09 MED ORDER — ALUM & MAG HYDROXIDE-SIMETH 200-200-20 MG/5ML PO SUSP: 30.0000 mL | Freq: Four times a day (QID) | ORAL | Status: DC | PRN

## 2015-01-09 MED ORDER — ONDANSETRON HCL 4 MG/2ML IJ SOLN: 4.0000 mg | Freq: Four times a day (QID) | INTRAMUSCULAR | Status: DC | PRN

## 2015-01-09 MED ORDER — SODIUM CHLORIDE 0.9 % IV SOLN: INTRAVENOUS | Status: DC

## 2015-01-09 MED ORDER — PANTOPRAZOLE SODIUM 40 MG PO TBEC
40.0000 mg | DELAYED_RELEASE_TABLET | Freq: Every day | ORAL | Status: DC
  Administered 2015-01-10 – 2015-01-15 (×6): 40 mg via ORAL
  Filled 2015-01-09 (×5): qty 1

## 2015-01-09 MED ORDER — VITAMIN B-12 1000 MCG PO TABS
2000.0000 ug | ORAL_TABLET | Freq: Every day | ORAL | Status: DC
  Administered 2015-01-10 – 2015-01-15 (×6): 2000 ug via ORAL
  Filled 2015-01-09 (×6): qty 2

## 2015-01-09 MED ORDER — ACETAMINOPHEN 650 MG RE SUPP: 650.0000 mg | Freq: Four times a day (QID) | RECTAL | Status: DC | PRN

## 2015-01-09 MED ORDER — SODIUM CHLORIDE 0.9 % IV BOLUS (SEPSIS): 500.0000 mL | Freq: Once | INTRAVENOUS | Status: AC

## 2015-01-09 MED ORDER — ONDANSETRON HCL 4 MG PO TABS: 4.0000 mg | ORAL_TABLET | Freq: Four times a day (QID) | ORAL | Status: DC | PRN

## 2015-01-09 MED ORDER — HYDROCODONE-ACETAMINOPHEN 5-325 MG PO TABS
1.0000 | ORAL_TABLET | ORAL | Status: DC | PRN
  Administered 2015-01-12: 1 via ORAL
  Filled 2015-01-09: qty 1

## 2015-01-09 MED ORDER — ACETAMINOPHEN 325 MG PO TABS
650.0000 mg | ORAL_TABLET | Freq: Four times a day (QID) | ORAL | Status: DC | PRN
Start: 2015-01-09 — End: 2015-01-15
  Administered 2015-01-14: 650 mg via ORAL
  Filled 2015-01-09: qty 2

## 2015-01-09 MED ORDER — SODIUM CHLORIDE 0.9 % IJ SOLN
3.0000 mL | Freq: Two times a day (BID) | INTRAMUSCULAR | Status: DC
  Administered 2015-01-10 – 2015-01-15 (×7): 3 mL via INTRAVENOUS

## 2015-01-09 MED ORDER — SODIUM CHLORIDE 0.9 % IV SOLN
Freq: Once | INTRAVENOUS | Status: AC
  Administered 2015-01-09: 17:00:00 via INTRAVENOUS

## 2015-01-09 MED ORDER — SODIUM CHLORIDE 0.9 % IV SOLN
INTRAVENOUS | Status: DC
  Administered 2015-01-09: 19:00:00 via INTRAVENOUS

## 2015-01-09 MED ORDER — SODIUM CHLORIDE 0.9 % IV BOLUS (SEPSIS)
500.0000 mL | Freq: Once | INTRAVENOUS | Status: AC
  Administered 2015-01-09: 500 mL via INTRAVENOUS

## 2015-01-09 MED ORDER — ATORVASTATIN CALCIUM 40 MG PO TABS
40.0000 mg | ORAL_TABLET | Freq: Every day | ORAL | Status: DC
  Administered 2015-01-10 – 2015-01-14 (×5): 40 mg via ORAL
  Filled 2015-01-09 (×6): qty 1

## 2015-01-09 NOTE — ED Notes (Signed)
[name] (daughter) [phone]

## 2015-01-09 NOTE — ED Notes (Signed)
Attempted IV start x2. Second RN to attempt

## 2015-01-09 NOTE — H&P (Signed)
History and Physical        Hospital Admission Note Date: 01/09/2015  Patient name: Marissa [name] Medical record number: [id number] Date of birth: 1941/03/20 Age: 74 y.o. Gender: female  PCP: [name], MD  Referring physician: Dr Kathrynn Humble  Chief Complaint:  Sent from PCPs office for abnormal labs  HPI: Patient is a 74 year old female with CAD, hypertension, hyperlipidemia, diverticulosis with recurrent GI bleeding was recently discharged on 4/18 when she had presented with rectal bleeding, was followed by gastroenterology. She had a prior admission, 4/9- 4/14, with similar presentation, underwent colonoscopy (Dr. Collene Mares), which showed pandiverticulosis with inspissated stool in several of the diverticula, one bleeding diverticulum which was clipped. The patient went to see her PCP for hospital follow-up and found to have creatinine of 3.38, on 4/27 was 2.69, on 4/18 was 1.3. Hemoglobin was stable, patient was sent to the ER for IV fluid hydration. Patient reported no rectal bleeding up until this morning when she noticed slight blood on the wipe this morning. FOBT was positive in NAD.  Review of Systems:  Constitutional: Denies fever, chills, diaphoresis, poor appetite and fatigue.  HEENT: Denies photophobia, eye pain, redness, hearing loss, ear pain, congestion, sore throat, rhinorrhea, sneezing, mouth sores, trouble swallowing, neck pain, neck stiffness and tinnitus.   Respiratory: Denies SOB, DOE, cough, chest tightness,  and wheezing.   Cardiovascular: Denies chest pain, palpitations and leg swelling.  Gastrointestinal: Please see history of present illness  Genitourinary: Denies dysuria, urgency, frequency, hematuria, flank pain and difficulty urinating.  Musculoskeletal: Denies myalgias, back pain, joint swelling, arthralgias and gait problem.  Skin: Denies pallor, rash and  wound.  Neurological: Denies dizziness, seizures, syncope, weakness, light-headedness, numbness and headaches.  Hematological: Denies adenopathy. Easy bruising, personal or family bleeding history  Psychiatric/Behavioral: Denies suicidal ideation, mood changes, confusion, nervousness, sleep disturbance and agitation  Past Medical History: Past Medical History  Diagnosis Date  . Hypertension   . Renal insufficiency     baseline Cr ~ 1.3  . Hyperlipidemia   . History of DVT (deep vein thrombosis)   . History of colonic polyps   . Gout   . Polyarthritis   . Microcytic anemia   . OSA (obstructive sleep apnea)   . GERD (gastroesophageal reflux disease)   . Esophagitis   . Hidradenitis suppurativa     s/p axillary sweat gland removal  . H/O blood transfusion reaction     at [facility] hospital  . Headache(784.0)   . Diverticulitis   . Difficulty sleeping   . PMB (postmenopausal bleeding)     X 2 YRS  . Bowel trouble     OCCASIONAL BOWEL INCONTINENCE  . Diabetes mellitus without complication     diet controlled  . Fibroids     s/p TAH/BSO 07/2012  . CAD (coronary artery disease)     a. 8/07 had BMS to OM.  b. In-stent restenosis with later Promus DES to same site.  c. Lexiscan myoview in 1/13 showed EF 67%, no ischemia or infarction.  d. Echo (2/13) showed EF 55-60%, moderate LVH, mild MR.  e. Lex MV 6/14: no isch, EF 53%;  f. Echo 6/14: mild LVH, EF 55-60%,  Gr 1 DD, MAC, mild MR, mild LAE  . Peripheral neuropathy   . Chronic diastolic CHF (congestive heart failure) 06/29/2014    Past Surgical History  Procedure Laterality Date  . Cervical fusion c-4-5 other    . Cervical laminectomy other    . Tonsillectomy other    . Umbilical hernia repair    . Axillary seat gland removal other    . Right knee arthroscopy other    . Colectomy  2004    left partial for perforation  . Cholecystectomy    . Tubal ligation    . Ptca and stenting      of mid and distal circumflex coronary  artery. 1st stent was 04/2006, with drug eluting stent placed on 12/20/07 (30-50% RCA stenosis). Cardiologist Dr. Einar Gip.  . Dilation and curettage of uterus  02/2011  . Hysteroscopy w/d&c  05/04/2012    Procedure: DILATATION AND CURETTAGE /HYSTEROSCOPY;  Surgeon: Lahoma Crocker, MD;  Location: [facility];  Service: Gynecology;  Laterality: N/A;  . Robotic assisted total hysterectomy with bilateral salpingo oopherectomy  07/24/2012    Procedure: ROBOTIC ASSISTED TOTAL HYSTERECTOMY WITH BILATERAL SALPINGO OOPHORECTOMY;  Surgeon: Janie Morning, MD PHD;  Location: [facility];  Service: Gynecology;  Laterality: Bilateral;  attempted ,  converted to abdomnial hysterectomy  . Abdominal hysterectomy  07/24/2012    Procedure: HYSTERECTOMY ABDOMINAL;  Surgeon: Janie Morning, MD PHD;  Location: [facility];  Service: Gynecology;;  . Salpingoophorectomy  07/24/2012    Procedure: SALPINGO OOPHORECTOMY;  Surgeon: Janie Morning, MD PHD;  Location: [facility];  Service: Gynecology;  Laterality: Bilateral;  . Abdominal wound dehiscence  08/01/2012    Procedure: ABDOMINAL WOUND DEHISCENCE;  Surgeon: Lahoma Crocker, MD;  Location: [facility];  Service: Gynecology;  Laterality: N/A;  . Laparotomy  08/02/2012    Procedure: EXPLORATORY LAPAROTOMY;  Surgeon: Imogene Burn. Georgette Dover, MD;  Location: [facility];  Service: General;  Laterality: N/A;  placement of abdominal wound vac  . Colonoscopy N/A 12/22/2014    Procedure: COLONOSCOPY;  Surgeon: Juanita Craver, MD;  Location: [facility];  Service: Endoscopy;  Laterality: N/A;    Medications: Prior to Admission medications   Medication Sig Start Date End Date Taking? Authorizing Provider  amLODipine (NORVASC) 10 MG tablet Take 1 tablet (10 mg total) by mouth daily. 05/29/14  Yes Janith Lima, MD  atorvastatin (LIPITOR) 40 MG tablet Take 1 tablet (40 mg total) by mouth daily. 12/25/14  Yes Reyne Dumas, MD  BENICAR 20 MG tablet  12/25/14  Yes Historical Provider, MD  colchicine 0.6 MG tablet  Take 1 tablet (0.6 mg total) by mouth 2 (two) times daily. 12/25/14  Yes Reyne Dumas, MD  cyanocobalamin 2000 MCG tablet Take 1 tablet (2,000 mcg total) by mouth daily. 01/07/15  Yes Janith Lima, MD  erythromycin ophthalmic ointment  11/26/14  Yes Historical Provider, MD  Ferrous Sulfate (IRON) 325 (65 FE) MG TABS Take 325 mg by mouth 2 (two) times daily. 12/25/14  Yes Reyne Dumas, MD  furosemide (LASIX) 20 MG tablet Take 20 mg by mouth daily as needed for fluid.  11/03/14  Yes Historical Provider, MD  metoprolol tartrate (LOPRESSOR) 25 MG tablet Take 1 tablet (25 mg total) by mouth every morning. 12/25/14  Yes Reyne Dumas, MD  pantoprazole (PROTONIX) 40 MG tablet Take 1 tablet (40 mg total) by mouth daily. 12/25/14  Yes Reyne Dumas, MD  VOLTAREN 1 % GEL  12/25/14  Yes Historical Provider, MD  nitroGLYCERIN (NITROSTAT) 0.4 MG SL tablet  Place 1 tablet (0.4 mg total) under the tongue every 5 (five) minutes as needed for chest pain. 02/26/13   Larey Dresser, MD    Allergies:  No Known Allergies  Social History:  reports that she has never smoked. She has never used smokeless tobacco. She reports that she does not drink alcohol or use illicit drugs.  Family History: Family History reviewed with the patient Family History  Problem Relation Age of Onset  . Diabetes Mother   . Coronary artery disease Mother   . Diabetes Sister   . Coronary artery disease Sister     Physical Exam: Blood pressure 112/92, pulse 75, temperature 97.3 F (36.3 C), temperature source Oral, resp. rate 18, height 5\' 4"  (1.626 m), SpO2 100 %. General: Alert, awake, oriented x3, in no acute distress. HEENT: normocephalic, atraumatic, anicteric sclera, pink conjunctiva, pupils equal and reactive to light and accomodation, oropharynx clear Neck: supple, no masses or lymphadenopathy, no goiter, no bruits  Heart: Regular rate and rhythm, without murmurs, rubs or gallops. Lungs: Clear to auscultation bilaterally, no  wheezing, rales or rhonchi. Abdomen: Soft, nontender, nondistended, positive bowel sounds, no masses. Extremities: No clubbing, cyanosis or edema with positive pedal pulses. Neuro: Grossly intact, no focal neurological deficits, strength 5/5 upper and lower extremities bilaterally Psych: alert and oriented x 3, normal mood and affect Skin: no rashes or lesions, warm and dry   LABS on Admission:  Basic Metabolic Panel:  Recent Labs Lab 01/07/15 1126 01/09/15 1255  NA 136 141  K 4.6 4.6  CL 114* 118*  CO2 15* 13*  GLUCOSE 101* 94  BUN 31* 44*  CREATININE 2.69* 3.38*  CALCIUM 10.0 9.7   Liver Function Tests:  Recent Labs Lab 01/09/15 1255  AST 19  ALT 14  ALKPHOS 127*  BILITOT 0.5  PROT 7.8  ALBUMIN 3.9   No results for input(s): LIPASE, AMYLASE in the last 168 hours. No results for input(s): AMMONIA in the last 168 hours. CBC:  Recent Labs Lab 01/07/15 1126 01/09/15 1255  WBC 6.5 6.4  NEUTROABS 5.0  --   HGB 9.6* 10.0*  HCT 30.2* 32.1*  MCV 70.5* 73.0*  PLT 303.0 298   Cardiac Enzymes: No results for input(s): CKTOTAL, CKMB, CKMBINDEX, TROPONINI in the last 168 hours. BNP: Invalid input(s): POCBNP CBG: No results for input(s): GLUCAP in the last 168 hours.  Radiological Exams on Admission:  Ct Head Wo Contrast  12/28/2014   CLINICAL DATA:  Hypertension and ventricular tachycardia.  EXAM: CT HEAD WITHOUT CONTRAST  TECHNIQUE: Contiguous axial images were obtained from the base of the skull through the vertex without intravenous contrast.  COMPARISON:  01/12/2012  FINDINGS: Stable age related cerebral atrophy, ventriculomegaly and periventricular white matter disease. No extra-axial fluid collections are identified. No CT findings for acute hemispheric infarction or intracranial hemorrhage. No mass lesions. The brainstem and cerebellum are normal.  No acute bony findings. The paranasal sinuses and mastoid air cells are clear. The globes are intact.  IMPRESSION:  No acute intracranial findings.   Electronically Signed   By: Marijo Sanes M.D.   On: 12/28/2014 10:04   Dg Chest Port 1 View  12/27/2014   CLINICAL DATA:  Chest pain  EXAM: PORTABLE CHEST - 1 VIEW  COMPARISON:  11/02/2014  FINDINGS: Mild cardiomegaly is stable from previous. Aortic and hilar contours are stable and negative. There is no edema, consolidation, effusion, or pneumothorax.  IMPRESSION: No active disease.   Electronically Signed   By:  Monte Fantasia M.D.   On: 12/27/2014 05:02    *I have personally reviewed the images above*  Assessment/Plan Principal Problem:   AKI (acute kidney injury) on chronic kidney disease, stage III - Patient denies any nausea, vomiting, diarrhea, no significant change in the diet. FOBT is positive however hemoglobin is stable - will place on gentle hydration, obtain renal ultrasound for further workup - Hold Lasix and Benicar, colchicine  Active Problems: Diverticulosis with GI bleed: No gross GI bleeding  - Currently no gross bleeding, hemoglobin is stable, although FOBT is positive, place on clear liquid diet - Recent colonoscopy on 4/11 had shown pan diverticulosis   - Discussed with Dr Benson Norway, recommended to observe, serial H&H, if patient has any gross bleeding, recommended Tagged RBC scan. Please call GI again if patient is having gross GI bleeding.  coronary artery disease -  Holding aspirin due to GI bleed, on beta blocker, however BP is currently borderline soft, hold metoprolol and amlodipine, Benicar  - continue statin  Chronic diastolic heart failure, appears somewhat dry - Once diet advanced, low sodium diet - Monitor closely for any volume overload  Borderline hypotension with history of hypertension - Hold beta blocker, Lasix, Benicar, amlodipine   DVT prophylaxis: SCDs  CODE STATUS: Full CODE STATUS  Family Communication: Admission, patients condition and plan of care including tests being ordered have been discussed with  the patient and son  who indicates understanding and agree with the plan and Code Status  Disposition plan: Further plan will depend as patient's clinical course evolves and further radiologic and laboratory data become available. Likely home   Time Spent on Admission: 60 mins   RAI,RIPUDEEP M.D. [facility] 01/09/2015, 4:06 PM Pager: [phone]  If 7PM-7AM, please contact night-coverage [email or web address] Password [email or web address]

## 2015-01-09 NOTE — ED Notes (Signed)
Dr. Nanavati at bedside 

## 2015-01-09 NOTE — ED Provider Notes (Signed)
CSN: [id number]     Arrival date & time 01/09/15  1208 History   First MD Initiated Contact with Patient 01/09/15 1436     Chief Complaint  Patient presents with  . GI Bleeding    recent GI bleed     (Consider location/radiation/quality/duration/timing/severity/associated sxs/prior Treatment) HPI Comments: Pt comes in with cc of rectal bleed. She has hx of diverticular bleed, CKD, CAD. Pt has had 2 admission for LGIB this month, and has needed 2 units of PRBC transfusion. Pt reports that she continues to have dark tarry stools. She is on iron now. She also reports increased weakness., dizziness and poor po intake - as anytime she eats or drink, she ends up with a BM. No current chest pain, dib.   ROS 10 Systems reviewed and are negative for acute change except as noted in the HPI.     The history is provided by the patient.    Past Medical History  Diagnosis Date  . Hypertension   . Renal insufficiency     baseline Cr ~ 1.3  . Hyperlipidemia   . History of DVT (deep vein thrombosis)   . History of colonic polyps   . Gout   . Polyarthritis   . Microcytic anemia   . OSA (obstructive sleep apnea)   . GERD (gastroesophageal reflux disease)   . Esophagitis   . Hidradenitis suppurativa     s/p axillary sweat gland removal  . H/O blood transfusion reaction     at [facility] hospital  . Headache(784.0)   . Diverticulitis   . Difficulty sleeping   . PMB (postmenopausal bleeding)     X 2 YRS  . Bowel trouble     OCCASIONAL BOWEL INCONTINENCE  . Diabetes mellitus without complication     diet controlled  . Fibroids     s/p TAH/BSO 07/2012  . CAD (coronary artery disease)     a. 8/07 had BMS to OM.  b. In-stent restenosis with later Promus DES to same site.  c. Lexiscan myoview in 1/13 showed EF 67%, no ischemia or infarction.  d. Echo (2/13) showed EF 55-60%, moderate LVH, mild MR.  e. Lex MV 6/14: no isch, EF 53%;  f. Echo 6/14: mild LVH, EF 55-60%, Gr 1 DD, MAC, mild MR, mild  LAE  . Peripheral neuropathy   . Chronic diastolic CHF (congestive heart failure) 06/29/2014   Past Surgical History  Procedure Laterality Date  . Cervical fusion c-4-5 other    . Cervical laminectomy other    . Tonsillectomy other    . Umbilical hernia repair    . Axillary seat gland removal other    . Right knee arthroscopy other    . Colectomy  2004    left partial for perforation  . Cholecystectomy    . Tubal ligation    . Ptca and stenting      of mid and distal circumflex coronary artery. 1st stent was 04/2006, with drug eluting stent placed on 12/20/07 (30-50% RCA stenosis). Cardiologist Dr. Einar Gip.  . Dilation and curettage of uterus  02/2011  . Hysteroscopy w/d&c  05/04/2012    Procedure: DILATATION AND CURETTAGE /HYSTEROSCOPY;  Surgeon: Lahoma Crocker, MD;  Location: [facility] [facility];  Service: Gynecology;  Laterality: N/A;  . Robotic assisted total hysterectomy with bilateral salpingo oopherectomy  07/24/2012    Procedure: ROBOTIC ASSISTED TOTAL HYSTERECTOMY WITH BILATERAL SALPINGO OOPHORECTOMY;  Surgeon: Janie Morning, MD PHD;  Location: [facility];  Service: Gynecology;  Laterality: Bilateral;  attempted ,  converted to abdomnial hysterectomy  . Abdominal hysterectomy  07/24/2012    Procedure: HYSTERECTOMY ABDOMINAL;  Surgeon: Janie Morning, MD PHD;  Location: [facility];  Service: Gynecology;;  . Salpingoophorectomy  07/24/2012    Procedure: SALPINGO OOPHORECTOMY;  Surgeon: Janie Morning, MD PHD;  Location: [facility];  Service: Gynecology;  Laterality: Bilateral;  . Abdominal wound dehiscence  08/01/2012    Procedure: ABDOMINAL WOUND DEHISCENCE;  Surgeon: Lahoma Crocker, MD;  Location: [facility];  Service: Gynecology;  Laterality: N/A;  . Laparotomy  08/02/2012    Procedure: EXPLORATORY LAPAROTOMY;  Surgeon: Imogene Burn. Georgette Dover, MD;  Location: [facility];  Service: General;  Laterality: N/A;  placement of abdominal wound vac  . Colonoscopy N/A 12/22/2014    Procedure: COLONOSCOPY;  Surgeon:  Juanita Craver, MD;  Location: [facility];  Service: Endoscopy;  Laterality: N/A;   Family History  Problem Relation Age of Onset  . Diabetes Mother   . Coronary artery disease Mother   . Diabetes Sister   . Coronary artery disease Sister    History  Substance Use Topics  . Smoking status: Never Smoker   . Smokeless tobacco: Never Used  . Alcohol Use: No   OB History    No data available     Review of Systems  Constitutional: Positive for fatigue.  Cardiovascular: Negative for chest pain.  Gastrointestinal: Positive for blood in stool. Negative for abdominal pain.  Hematological: Does not bruise/bleed easily.  All other systems reviewed and are negative.     Allergies  Review of patient's allergies indicates no known allergies.  Home Medications   Prior to Admission medications   Medication Sig Start Date End Date Taking? Authorizing Provider  amLODipine (NORVASC) 10 MG tablet Take 1 tablet (10 mg total) by mouth daily. 05/29/14  Yes Janith Lima, MD  atorvastatin (LIPITOR) 40 MG tablet Take 1 tablet (40 mg total) by mouth daily. 12/25/14  Yes Reyne Dumas, MD  BENICAR 20 MG tablet  12/25/14  Yes Historical Provider, MD  colchicine 0.6 MG tablet Take 1 tablet (0.6 mg total) by mouth 2 (two) times daily. 12/25/14  Yes Reyne Dumas, MD  cyanocobalamin 2000 MCG tablet Take 1 tablet (2,000 mcg total) by mouth daily. 01/07/15  Yes Janith Lima, MD  erythromycin ophthalmic ointment  11/26/14  Yes Historical Provider, MD  Ferrous Sulfate (IRON) 325 (65 FE) MG TABS Take 325 mg by mouth 2 (two) times daily. 12/25/14  Yes Reyne Dumas, MD  furosemide (LASIX) 20 MG tablet Take 20 mg by mouth daily as needed for fluid.  11/03/14  Yes Historical Provider, MD  metoprolol tartrate (LOPRESSOR) 25 MG tablet Take 1 tablet (25 mg total) by mouth every morning. 12/25/14  Yes Reyne Dumas, MD  pantoprazole (PROTONIX) 40 MG tablet Take 1 tablet (40 mg total) by mouth daily. 12/25/14  Yes Reyne Dumas, MD  VOLTAREN 1 % GEL  12/25/14  Yes Historical Provider, MD  nitroGLYCERIN (NITROSTAT) 0.4 MG SL tablet Place 1 tablet (0.4 mg total) under the tongue every 5 (five) minutes as needed for chest pain. 02/26/13   Larey Dresser, MD   BP 126/63 mmHg  Pulse 75  Temp(Src) 97.3 F (36.3 C) (Oral)  Resp 18  Ht 5\' 4"  (1.626 m)  SpO2 100% Physical Exam  Constitutional: She is oriented to person, place, and time. She appears well-developed and well-nourished.  HENT:  Head: Normocephalic and atraumatic.  Eyes: EOM are normal. Pupils are equal, round, and  reactive to light.  Neck: Neck supple.  Cardiovascular: Normal rate, regular rhythm and normal heart sounds.   Pulmonary/Chest: Effort normal. No respiratory distress.  Abdominal: Soft. She exhibits no distension. There is no tenderness. There is no rebound and no guarding.  Dark stool, guaic +  Neurological: She is alert and oriented to person, place, and time.  Skin: Skin is warm and dry.  Nursing note and vitals reviewed.   ED Course  Procedures (including critical care time) Labs Review Labs Reviewed  CBC - Abnormal; Notable for the following:    Hemoglobin 10.0 (*)    HCT 32.1 (*)    MCV 73.0 (*)    MCH 22.7 (*)    RDW 15.6 (*)    All other components within normal limits  COMPREHENSIVE METABOLIC PANEL - Abnormal; Notable for the following:    Chloride 118 (*)    CO2 13 (*)    BUN 44 (*)    Creatinine, Ser 3.38 (*)    Alkaline Phosphatase 127 (*)    GFR calc non Af Amer 12 (*)    GFR calc Af Amer 14 (*)    All other components within normal limits  POC OCCULT BLOOD, ED - Abnormal; Notable for the following:    Fecal Occult Bld POSITIVE (*)    All other components within normal limits  URINE CULTURE  URINALYSIS, ROUTINE W REFLEX MICROSCOPIC  TYPE AND SCREEN    Imaging Review No results found.   EKG Interpretation None      MDM   Final diagnoses:  AKI (acute kidney injury)  History of GI diverticular  bleed    Pt comes in with cc of GI bleed. Hx of diverticular dx. She has tarry stools, but she is on iron and her Hb is stable and WNL - so doubt diverticular bleed currently, or at least clinically significant bleed. Pt however has elevated Cr and low bicarb - Cr is 3.38. Pt's bicarb is low - so we will get venous blood gas. Medicine admitting.     Varney Biles, MD 01/09/15 1635

## 2015-01-09 NOTE — Telephone Encounter (Signed)
Pt is heading to ED today to get fluids

## 2015-01-09 NOTE — ED Notes (Signed)
Pt recently discharged after being tx for GI bleed (recieved 2 units blood).  Went to pcp for follow up today and he sent her here, per pt, for low kidney function.  Pt states she feels weak and that her stool is black.

## 2015-01-10 LAB — HEMOGLOBIN AND HEMATOCRIT, BLOOD
HEMATOCRIT: 28 % — AB (ref 36.0–46.0)
Hemoglobin: 8.6 g/dL — ABNORMAL LOW (ref 12.0–15.0)

## 2015-01-10 LAB — BASIC METABOLIC PANEL
ANION GAP: 10 (ref 5–15)
BUN: 41 mg/dL — ABNORMAL HIGH (ref 6–23)
CO2: 13 mmol/L — ABNORMAL LOW (ref 19–32)
Calcium: 9.2 mg/dL (ref 8.4–10.5)
Chloride: 118 mmol/L — ABNORMAL HIGH (ref 96–112)
Creatinine, Ser: 2.7 mg/dL — ABNORMAL HIGH (ref 0.50–1.10)
GFR, EST AFRICAN AMERICAN: 19 mL/min — AB (ref >90)
GFR, EST NON AFRICAN AMERICAN: 16 mL/min — AB (ref >90)
Glucose, Bld: 84 mg/dL (ref 70–99)
Potassium: 4.1 mmol/L (ref 3.5–5.1)
SODIUM: 141 mmol/L (ref 135–145)

## 2015-01-10 MED ORDER — STERILE WATER FOR INJECTION IV SOLN
INTRAVENOUS | Status: DC
  Administered 2015-01-10: 10:00:00 via INTRAVENOUS
  Filled 2015-01-10 (×4): qty 850

## 2015-01-10 NOTE — Progress Notes (Signed)
[facility] PROGRESS NOTE  Marissa [name] WVP:[id number] DOB: [birth year] DOA: 01/09/2015 PCP: [name], MD  Assessment/Plan: Patient is a 74 year old female with CAD, hypertension, hyperlipidemia, diverticulosis with recurrent GI bleeding was recently discharged on 4/18 when she had presented with rectal bleeding, was followed by gastroenterology. She had a prior admission, 4/9- 4/14, with similar presentation, underwent colonoscopy (Dr. Collene Mares), which showed pandiverticulosis with inspissated stool in several of the diverticula, one bleeding diverticulum which was clipped. The patient went to see her PCP for hospital follow-up and found to have creatinine of 3.38, on 4/27 was 2.69, on 4/18 was 1.3. Hemoglobin was stable, patient was sent to the ER for IV fluid hydration. Patient reported no rectal bleeding up until this morning when she noticed slight blood on the wipe this morning.   1-AKI (acute kidney injury) on chronic kidney disease, stage III - related to lasix, diarrhea. Decrease volume.  - Continue Hold Lasix and Benicar, colchicine -I will start bicarb Gtt.  -renal US negative for hydronephrosis.  -follow up urine culture.  -Strict I and o.  -urine out put 550.   Pyuria:  Follow up urine culture.   Diverticulosis with GI bleed: No gross GI bleeding  - Currently no gross bleeding.  - Recent colonoscopy on 4/11 had shown pan diverticulosis  - Admitting physician Discussed with Dr Benson Norway, recommended to observe, serial H&H, if patient has any gross bleeding, recommended Tagged RBC scan. Please call GI again if patient is having gross GI bleeding. -hb at 8.5. Was at 8.5 on 4-18.   coronary artery disease - Holding aspirin due to GI bleed, on beta blocker, however BP is currently borderline soft, hold metoprolol and amlodipine, Benicar  - continue statin  Chronic diastolic heart failure,  - Monitor closely for any volume overload  Borderline hypotension with history of  hypertension - Hold beta blocker, Lasix, Benicar, amlodipine  Code Status: Full code.  Family Communication: care discussed with patient.  Disposition Plan: Remain inpatient for treatment of renal failure.    Consultants:  none  Procedures: Renal US; 1. No evidence of hydronephrosis. 2. Increased renal parenchymal echogenicity raises concern for medical renal disease.  3. Small right renal cyst again noted.  Antibiotics:  none  HPI/Subjective: Denies blood in the stool.  She report diarrhea 1 or 2 BM watery.  She is making urine.    Objective: Filed Vitals:   01/10/15 0508  BP: 109/58  Pulse: 83  Temp: 98.1 F (36.7 C)  Resp: 17    Intake/Output Summary (Last 24 hours) at 01/10/15 0836 Last data filed at 01/10/15 0508  Gross per 24 hour  Intake   1105 ml  Output    550 ml  Net    555 ml   There were no vitals filed for this visit.  Exam:   General:  Alert in no distress. Following command.   Cardiovascular: S 1, S 2 RRR  Respiratory: CTA, decrease breath sound.   Abdomen: BS present, soft, nt  Musculoskeletal: no edema  Data Reviewed: Basic Metabolic Panel:  Recent Labs Lab 01/07/15 1126 01/09/15 1255 01/10/15 0318  NA 136 141 141  K 4.6 4.6 4.1  CL 114* 118* 118*  CO2 15* 13* 13*  GLUCOSE 101* 94 84  BUN 31* 44* 41*  CREATININE 2.69* 3.38* 2.70*  CALCIUM 10.0 9.7 9.2   Liver Function Tests:  Recent Labs Lab 01/09/15 1255  AST 19  ALT 14  ALKPHOS 127*  BILITOT 0.5  PROT 7.8  ALBUMIN 3.9   No results for input(s): LIPASE, AMYLASE in the last 168 hours. No results for input(s): AMMONIA in the last 168 hours. CBC:  Recent Labs Lab 01/07/15 1126 01/09/15 1255 01/09/15 2109 01/10/15 0318  WBC 6.5 6.4  --   --   NEUTROABS 5.0  --   --   --   HGB 9.6* 10.0* 8.8* 8.6*  HCT 30.2* 32.1* 28.5* 28.0*  MCV 70.5* 73.0*  --   --   PLT 303.0 298  --   --    Cardiac Enzymes: No results for input(s): CKTOTAL, CKMB, CKMBINDEX,  TROPONINI in the last 168 hours. BNP (last 3 results)  Recent Labs  11/02/14 2340  BNP 109.2*    ProBNP (last 3 results) No results for input(s): PROBNP in the last 8760 hours.  CBG: No results for input(s): GLUCAP in the last 168 hours.  No results found for this or any previous visit (from the past 240 hour(s)).   Studies: US Renal  01/10/2015   CLINICAL DATA:  Acute onset of renal insufficiency. Initial encounter.  EXAM: RENAL / URINARY TRACT ULTRASOUND COMPLETE  COMPARISON:  CT of the abdomen and pelvis performed 08/10/2012  FINDINGS: Right Kidney:  Length: 11.4 cm. Increased parenchymal echogenicity noted. A 1.6 x 1.5 x 1.4 cm cyst is noted near the upper pole of the right kidney. No hydronephrosis visualized.  Left Kidney:  Length: 10.4 cm. Echogenicity within normal limits. No mass or hydronephrosis visualized.  Bladder:  Decompressed and not well assessed.  IMPRESSION: 1. No evidence of hydronephrosis. 2. Increased renal parenchymal echogenicity raises concern for medical renal disease. 3. Small right renal cyst again noted.   Electronically Signed   By: Garald Balding M.D.   On: 01/10/2015 06:29    Scheduled Meds: . atorvastatin  40 mg Oral q1800  . pantoprazole  40 mg Oral Daily  . sodium chloride  3 mL Intravenous Q12H  . cyanocobalamin  2,000 mcg Oral Daily   Continuous Infusions: . sodium chloride    . sodium chloride 75 mL/hr at 01/10/15 0200    Principal Problem:   AKI (acute kidney injury) Active Problems:   OSA (obstructive sleep apnea)   Essential hypertension   GERD   Chronic kidney disease, stage 3   Hyperlipidemia with target LDL less than 100   Diverticulosis of colon with hemorrhage    Time spent: 35 minutes.     Niel Hummer A  [facility] Pager [phone]. If 7PM-7AM, please contact night-coverage at [email or web address], password [email or web address] 01/10/2015, 8:36 AM  LOS: 1 day

## 2015-01-11 LAB — BASIC METABOLIC PANEL
Anion gap: 10 (ref 5–15)
BUN: 29 mg/dL — ABNORMAL HIGH (ref 6–20)
CO2: 20 mmol/L — AB (ref 22–32)
CREATININE: 2.04 mg/dL — AB (ref 0.44–1.00)
Calcium: 8.9 mg/dL (ref 8.9–10.3)
Chloride: 112 mmol/L — ABNORMAL HIGH (ref 101–111)
GFR calc Af Amer: 26 mL/min — ABNORMAL LOW (ref >60)
GFR calc non Af Amer: 23 mL/min — ABNORMAL LOW (ref >60)
GLUCOSE: 112 mg/dL — AB (ref 70–99)
Potassium: 3.3 mmol/L — ABNORMAL LOW (ref 3.5–5.1)
Sodium: 142 mmol/L (ref 135–145)

## 2015-01-11 LAB — CBC
HCT: 25.4 % — ABNORMAL LOW (ref 36.0–46.0)
Hemoglobin: 8 g/dL — ABNORMAL LOW (ref 12.0–15.0)
MCH: 22.2 pg — ABNORMAL LOW (ref 26.0–34.0)
MCHC: 31.5 g/dL (ref 30.0–36.0)
MCV: 70.4 fL — ABNORMAL LOW (ref 78.0–100.0)
PLATELETS: 230 10*3/uL (ref 150–400)
RBC: 3.61 MIL/uL — ABNORMAL LOW (ref 3.87–5.11)
RDW: 15.3 % (ref 11.5–15.5)
WBC: 4.7 10*3/uL (ref 4.0–10.5)

## 2015-01-11 LAB — URINE CULTURE: Colony Count: >=100000

## 2015-01-11 LAB — CLOSTRIDIUM DIFFICILE BY PCR: Toxigenic C. Difficile by PCR: NEGATIVE

## 2015-01-11 MED ORDER — CEFTRIAXONE SODIUM IN DEXTROSE 20 MG/ML IV SOLN
1.0000 g | INTRAVENOUS | Status: DC
Start: 2015-01-11 — End: 2015-01-15
  Administered 2015-01-11 – 2015-01-15 (×5): 1 g via INTRAVENOUS
  Filled 2015-01-11 (×5): qty 50

## 2015-01-11 MED ORDER — FERROUS SULFATE 325 (65 FE) MG PO TABS
325.0000 mg | ORAL_TABLET | Freq: Two times a day (BID) | ORAL | Status: DC
Start: 2015-01-11 — End: 2015-01-15
  Administered 2015-01-11 – 2015-01-15 (×8): 325 mg via ORAL
  Filled 2015-01-11 (×11): qty 1

## 2015-01-11 MED ORDER — SODIUM BICARBONATE 650 MG PO TABS
650.0000 mg | ORAL_TABLET | Freq: Three times a day (TID) | ORAL | Status: DC
  Administered 2015-01-11 – 2015-01-13 (×7): 650 mg via ORAL
  Filled 2015-01-11 (×11): qty 1

## 2015-01-11 MED ORDER — POTASSIUM CHLORIDE CRYS ER 20 MEQ PO TBCR
40.0000 meq | EXTENDED_RELEASE_TABLET | Freq: Once | ORAL | Status: AC
  Administered 2015-01-11: 40 meq via ORAL
  Filled 2015-01-11: qty 2

## 2015-01-11 MED ORDER — COLCHICINE 0.6 MG PO TABS
0.6000 mg | ORAL_TABLET | Freq: Once | ORAL | Status: AC
  Administered 2015-01-11: 0.6 mg via ORAL
  Filled 2015-01-11: qty 1

## 2015-01-11 MED ORDER — IRON 325 (65 FE) MG PO TABS: 325.0000 mg | ORAL_TABLET | Freq: Two times a day (BID) | ORAL | Status: DC

## 2015-01-11 MED ORDER — SODIUM CHLORIDE 0.9 % IV SOLN
INTRAVENOUS | Status: DC
  Administered 2015-01-11 – 2015-01-14 (×6): via INTRAVENOUS

## 2015-01-11 NOTE — Progress Notes (Signed)
[facility] PROGRESS NOTE  Marissa [name] ZOX:[id number] DOB: Jan 19, 1941 DOA: 01/09/2015 PCP: [name], MD  Assessment/Plan: Patient is a 74 year old female with CAD, hypertension, hyperlipidemia, diverticulosis with recurrent GI bleeding was recently discharged on 4/18 when she had presented with rectal bleeding, was followed by gastroenterology. She had a prior admission, 4/9- 4/14, with similar presentation, underwent colonoscopy (Dr. Collene Mares), which showed pandiverticulosis with inspissated stool in several of the diverticula, one bleeding diverticulum which was clipped. The patient went to see her PCP for hospital follow-up and found to have creatinine of 3.38, on 4/27 was 2.69, on 4/18 was 1.3. Hemoglobin was stable, patient was sent to the ER for IV fluid hydration. Patient reported no rectal bleeding up until this morning when she noticed slight blood on the wipe this morning.   1-AKI (acute kidney injury) on chronic kidney disease, stage III - related to lasix, diarrhea. Decrease volume.  - Continue Hold Lasix and Benicar.  -metabolic acidosis improved with bicarb Gtt. Will change bicarb gtt to NS.  -renal US negative for hydronephrosis.  -Strict I and o.  -urine out put 600 -start sodium bicarb.   UTI;  Report dysuria. Urine culture growing 100,000 gram negative rods.  Start ceftriaxone.   Diverticulosis with GI bleed: No gross GI bleeding  - Currently no gross bleeding.  - Recent colonoscopy on 4/11 had shown pan diverticulosis  - Admitting physician Discussed with Dr Benson Norway, recommended to observe, serial H&H, if patient has any gross bleeding, recommended Tagged RBC scan. Please call GI again if patient is having gross GI bleeding. -hb at 8.0 Was at 8.5 on 4-18.  -resume ferrous sulfate.   coronary artery disease - Holding aspirin due to GI bleed, on beta blocker, however BP is currently borderline soft, hold metoprolol and amlodipine, Benicar  - continue  statin  Chronic diastolic heart failure,  - Monitor closely for any volume overload  Borderline hypotension with history of hypertension - Hold beta blocker, Lasix, Benicar, amlodipine  Gout: omplaining of bilateral big toe pain. Will give one time dose of colchicine.    Code Status: Full code.  Family Communication: care discussed with patient.  Disposition Plan: Remain inpatient for treatment of renal failure.    Consultants:  none  Procedures: Renal US; 1. No evidence of hydronephrosis. 2. Increased renal parenchymal echogenicity raises concern for medical renal disease.  3. Small right renal cyst again noted.  Antibiotics:  none  HPI/Subjective: No blood in the stool/ Complaining of bilateral toe pain.  Had one BM yesterday , watery.   Objective: Filed Vitals:   01/11/15 0521  BP: 135/55  Pulse: 76  Temp: 98.6 F (37 C)  Resp: 17    Intake/Output Summary (Last 24 hours) at 01/11/15 1435 Last data filed at 01/11/15 0739  Gross per 24 hour  Intake   2330 ml  Output    700 ml  Net   1630 ml   Filed Weights   01/10/15 2027  Weight: 98.884 kg (218 lb)    Exam:   General:  Alert in no distress.  Cardiovascular: S 1, S 2 RRR  Respiratory: CTA, decrease breath sound.   Abdomen: BS present, soft, nt  Musculoskeletal: no edema  Data Reviewed: Basic Metabolic Panel:  Recent Labs Lab 01/07/15 1126 01/09/15 1255 01/10/15 0318 01/11/15 0825  NA 136 141 141 142  K 4.6 4.6 4.1 3.3*  CL 114* 118* 118* 112*  CO2 15* 13* 13* 20*  GLUCOSE 101* 94 84 112*  BUN 31* 44* 41* 29*  CREATININE 2.69* 3.38* 2.70* 2.04*  CALCIUM 10.0 9.7 9.2 8.9   Liver Function Tests:  Recent Labs Lab 01/09/15 1255  AST 19  ALT 14  ALKPHOS 127*  BILITOT 0.5  PROT 7.8  ALBUMIN 3.9   No results for input(s): LIPASE, AMYLASE in the last 168 hours. No results for input(s): AMMONIA in the last 168 hours. CBC:  Recent Labs Lab 01/07/15 1126 01/09/15 1255  01/09/15 2109 01/10/15 0318 01/11/15 0825  WBC 6.5 6.4  --   --  4.7  NEUTROABS 5.0  --   --   --   --   HGB 9.6* 10.0* 8.8* 8.6* 8.0*  HCT 30.2* 32.1* 28.5* 28.0* 25.4*  MCV 70.5* 73.0*  --   --  70.4*  PLT 303.0 298  --   --  230   Cardiac Enzymes: No results for input(s): CKTOTAL, CKMB, CKMBINDEX, TROPONINI in the last 168 hours. BNP (last 3 results)  Recent Labs  11/02/14 2340  BNP 109.2*    ProBNP (last 3 results) No results for input(s): PROBNP in the last 8760 hours.  CBG: No results for input(s): GLUCAP in the last 168 hours.  Recent Results (from the past 240 hour(s))  Urine culture     Status: None (Preliminary result)   Collection Time: 01/09/15  5:20 PM  Result Value Ref Range Status   Specimen Description URINE, RANDOM  Final   Special Requests NONE  Final   Colony Count   Final    >=100,000 COLONIES/ML Performed at [facility]    Culture   Final    Goff Performed at [facility]    Report Status PENDING  Incomplete  Clostridium Difficile by PCR     Status: None   Collection Time: 01/11/15  9:30 AM  Result Value Ref Range Status   C difficile by pcr NEGATIVE NEGATIVE Final     Studies: US Renal  01/10/2015   CLINICAL DATA:  Acute onset of renal insufficiency. Initial encounter.  EXAM: RENAL / URINARY TRACT ULTRASOUND COMPLETE  COMPARISON:  CT of the abdomen and pelvis performed 08/10/2012  FINDINGS: Right Kidney:  Length: 11.4 cm. Increased parenchymal echogenicity noted. A 1.6 x 1.5 x 1.4 cm cyst is noted near the upper pole of the right kidney. No hydronephrosis visualized.  Left Kidney:  Length: 10.4 cm. Echogenicity within normal limits. No mass or hydronephrosis visualized.  Bladder:  Decompressed and not well assessed.  IMPRESSION: 1. No evidence of hydronephrosis. 2. Increased renal parenchymal echogenicity raises concern for medical renal disease. 3. Small right renal cyst again noted.   Electronically Signed   By:  Garald Balding M.D.   On: 01/10/2015 06:29    Scheduled Meds: . atorvastatin  40 mg Oral q1800  . cefTRIAXone (ROCEPHIN)  IV  1 g Intravenous Q24H  . ferrous sulfate  325 mg Oral BID WC  . pantoprazole  40 mg Oral Daily  . sodium chloride  3 mL Intravenous Q12H  . cyanocobalamin  2,000 mcg Oral Daily   Continuous Infusions: . sodium chloride 100 mL/hr at 01/11/15 1028    Principal Problem:   AKI (acute kidney injury) Active Problems:   OSA (obstructive sleep apnea)   Essential hypertension   GERD   Chronic kidney disease, stage 3   Hyperlipidemia with target LDL less than 100   Diverticulosis of colon with hemorrhage    Time spent: 35 minutes.  Niel Hummer A  [facility] Pager [phone]. If 7PM-7AM, please contact night-coverage at [email or web address], password [facility] 01/11/2015, 2:35 PM  LOS: 2 days

## 2015-01-12 LAB — BASIC METABOLIC PANEL
Anion gap: 10 (ref 5–15)
BUN: 21 mg/dL — AB (ref 6–20)
CALCIUM: 8.5 mg/dL — AB (ref 8.9–10.3)
CO2: 21 mmol/L — ABNORMAL LOW (ref 22–32)
CREATININE: 1.69 mg/dL — AB (ref 0.44–1.00)
Chloride: 112 mmol/L — ABNORMAL HIGH (ref 101–111)
GFR calc non Af Amer: 29 mL/min — ABNORMAL LOW (ref >60)
GFR, EST AFRICAN AMERICAN: 33 mL/min — AB (ref >60)
GLUCOSE: 82 mg/dL (ref 70–99)
Potassium: 3.4 mmol/L — ABNORMAL LOW (ref 3.5–5.1)
SODIUM: 143 mmol/L (ref 135–145)

## 2015-01-12 LAB — CBC
HCT: 23.8 % — ABNORMAL LOW (ref 36.0–46.0)
Hemoglobin: 7.5 g/dL — ABNORMAL LOW (ref 12.0–15.0)
MCH: 22.3 pg — AB (ref 26.0–34.0)
MCHC: 31.5 g/dL (ref 30.0–36.0)
MCV: 70.8 fL — AB (ref 78.0–100.0)
PLATELETS: 210 10*3/uL (ref 150–400)
RBC: 3.36 MIL/uL — AB (ref 3.87–5.11)
RDW: 15 % (ref 11.5–15.5)
WBC: 4.9 10*3/uL (ref 4.0–10.5)

## 2015-01-12 LAB — PREPARE RBC (CROSSMATCH)

## 2015-01-12 MED ORDER — SODIUM CHLORIDE 0.9 % IV SOLN
Freq: Once | INTRAVENOUS | Status: AC
  Administered 2015-01-12: 11:00:00 via INTRAVENOUS

## 2015-01-12 MED ORDER — POTASSIUM CHLORIDE CRYS ER 20 MEQ PO TBCR
20.0000 meq | EXTENDED_RELEASE_TABLET | Freq: Once | ORAL | Status: AC
  Administered 2015-01-12: 20 meq via ORAL
  Filled 2015-01-12: qty 1

## 2015-01-12 NOTE — Care Management Note (Signed)
Case Management Note  Patient Details  Name: Marissa [name] MRN: [id number] Date of Birth: 1941-02-13  Subjective/Objective:                  AKI  Action/Plan: Lives at home with dtr  Expected Discharge Date:  01/12/15               Expected Discharge Plan:  Home/Self Care  In-House Referral:     Discharge planning Services  CM Consult  Post Acute Care Choice:    Choice offered to:     DME Arranged:    DME Agency:     HH Arranged:    [place]:     Status of Service:     Medicare Important Message Given:  Yes Date Medicare IM Given:  01/12/15 Medicare IM give by:  Jonnie Finner RN CCM Date Additional Medicare IM Given:    Additional Medicare Important Message give by:     If discussed at [place] of Stay Meetings, dates discussed:    Additional Comments:  NCM spoke to pt and she has wheelchair, 3n1 and Rolling Walker. Lives at home with dtr, [name] (daughter) [phone]. Waiting final recommendations for home. Jonnie Finner RN CCM Case Mgmt phone [phone]  Erenest Rasher, RN 01/12/2015, 11:25 AM

## 2015-01-12 NOTE — Evaluation (Signed)
Physical Therapy Evaluation Patient Details Name: Marissa [name] MRN: [id number] DOB: February 28, 1941 Today's Date: 01/12/2015   History of Present Illness  Patient is a 74 year old female with CAD, hypertension, hyperlipidemia, diverticulosis with recurrent GI bleeding was recently discharged on 4/18 when she had presented with rectal bleeding, was followed by gastroenterology. She had a prior admission, 4/9- 4/14, with similar presentation, underwent colonoscopy (Dr. Collene Mares), which showed pandiverticulosis with inspissated stool in several of the diverticula, one bleeding diverticulum which was clipped. The patient went to see her PCP for hospital follow-up and found to have creatinine of 3.38, on 4/27 was 2.69, on 4/18 was 1.3. Hemoglobin was stable, patient was sent to the ER for IV fluid hydration. Patient reported no rectal bleeding up until this morning when she noticed slight blood on the wipe this morning. Fecal Occult Blood Test was positive  Clinical Impression   Pt admitted with above diagnosis. Pt currently with functional limitations due to the deficits listed below (see PT Problem List).  Pt will benefit from skilled PT to increase their independence and safety with mobility to allow discharge to the venue listed below.       Follow Up Recommendations Home health PT;Supervision/Assistance - 24 hour    Equipment Recommendations  None recommended by PT    Recommendations for Other Services       Precautions / Restrictions Precautions Precautions: Fall Precaution Comments: fall risk slight, greatly reduced with UE support Restrictions Weight Bearing Restrictions: No      Mobility  Bed Mobility                  Transfers Overall transfer level: Needs assistance Equipment used: None Transfers: Sit to/from Stand Sit to Stand: Min guard         General transfer comment: cues for hand placement  Ambulation/Gait Ambulation/Gait assistance: Min guard (without physical  contact) Ambulation Distance (Feet): 25 Feet Assistive device:  (pushing IV pole) Gait Pattern/deviations: Step-through pattern     General Gait Details: Cues for safety and to self-monitor for activity tolerance, given low Hgb; fairly steady on feet  Stairs            Wheelchair Mobility    Modified Rankin (Stroke Patients Only)       Balance             Standing balance-Leahy Scale: Fair                               Pertinent Vitals/Pain Pain Assessment: No/denies pain    Home Living Family/patient expects to be discharged to:: Private residence Living Arrangements: Children Available Help at Discharge: Family;Available PRN/intermittently Type of Home: House Home Access: Stairs to enter Entrance Stairs-Rails: None Entrance Stairs-Number of Steps: 1 Home Layout: One level Home Equipment: Cane - single point;Walker - 2 wheels;Walker - 4 wheels;Bedside commode;Wheelchair - manual;Shower seat - built in Additional Comments: Pt states that family is there most of time.      Prior Function Level of Independence: Needs assistance   Gait / Transfers Assistance Needed: States she uses cane most of the time but when she isnt' feeling good uses Rollator and at times even uses wheelchair to roll around in home  ADL's / Homemaking Assistance Needed: Had some assist per pt        Hand Dominance        Extremity/Trunk Assessment   Upper Extremity Assessment: Overall WFL for tasks assessed  Lower Extremity Assessment: Generalized weakness      Cervical / Trunk Assessment: Kyphotic  Communication   Communication: No difficulties  Cognition Arousal/Alertness: Awake/alert Behavior During Therapy: WFL for tasks assessed/performed Overall Cognitive Status: Within Functional Limits for tasks assessed                      General Comments      Exercises        Assessment/Plan    PT Assessment Patient needs continued  PT services  PT Diagnosis Generalized weakness   PT Problem List Decreased activity tolerance;Decreased balance;Decreased mobility;Decreased knowledge of use of DME;Decreased safety awareness  PT Treatment Interventions Gait training;Functional mobility training;Therapeutic activities;Therapeutic exercise;Balance training;Stair training;DME instruction;Patient/family education   PT Goals (Current goals can be found in the Care Plan section) Acute Rehab PT Goals Patient Stated Goal: to get better PT Goal Formulation: With patient Time For Goal Achievement: 01/26/15 Potential to Achieve Goals: Good    Frequency Min 3X/week   Barriers to discharge        Co-evaluation               End of Session   Activity Tolerance: Patient tolerated treatment well Patient left: in chair;with call bell/phone within reach Nurse Communication: Mobility status         Time: 7654-6503 PT Time Calculation (min) (ACUTE ONLY): 22 min   Charges:   PT Evaluation $Initial PT Evaluation Tier I: 1 Procedure     PT G CodesRoney Marion Hamff 01/12/2015, 11:52 AM  Roney Marion, [place] Pager [phone] Office [phone]

## 2015-01-12 NOTE — Consult Note (Signed)
Reason for Consult: Anemia with rectal bleeding. Referring Physician: THP  Marissa [name] is an 74 y.o. female.  HPI: 74 year old black female, with multiple medical problems listed below, readmitted with elevated creatinine and dehydration. She has a history of anemia and has had recurrent rectal bleeding that she had just this morning on the toilet tissue. She denies having any hematochezia at home. She has been having several loose BM's and was admitted with AKI thought to be due to her medications and dehydration. She describes having some periumbilical pain a few days ago but this has since resolved. Her creatinine has improved since admission. She has a problem with chronic constipation till she developed after discharge recently. She was thought to have hematochezia from diverticular bleeding and on 12/22/14 had a colonoscopy with hemo-clipping of a diverticulum with a clot in it. She has been on PPI's for chronic reflux but denies having any dysphagia or odynophagia. She has a fairly good appetite but feels she has lost some weight. She is unable to quantify the amount of weight she has lost.  Past Medical History  Diagnosis Date  . Hypertension   . Renal insufficiency     baseline Cr ~ 1.3  . Hyperlipidemia   . History of DVT (deep vein thrombosis)   . History of colonic polyps   . Gout   . Polyarthritis   . Microcytic anemia   . OSA (obstructive sleep apnea)   . GERD (gastroesophageal reflux disease)   . Esophagitis   . Hidradenitis suppurativa     s/p axillary sweat gland removal  . H/O blood transfusion reaction     at [facility] hospital  . Headache(784.0)   . Diverticulitis   . Difficulty sleeping   . PMB (postmenopausal bleeding)     X 2 YRS  . Bowel trouble     OCCASIONAL BOWEL INCONTINENCE  . Diabetes mellitus without complication     diet controlled  . Fibroids     s/p TAH/BSO 07/2012  . CAD (coronary artery disease)     a. 8/07 had BMS to OM.  b. In-stent restenosis  with later Promus DES to same site.  c. Lexiscan myoview in 1/13 showed EF 67%, no ischemia or infarction.  d. Echo (2/13) showed EF 55-60%, moderate LVH, mild MR.  e. Lex MV 6/14: no isch, EF 53%;  f. Echo 6/14: mild LVH, EF 55-60%, Gr 1 DD, MAC, mild MR, mild LAE  . Peripheral neuropathy   . Chronic diastolic CHF (congestive heart failure) 06/29/2014   Past Surgical History  Procedure Laterality Date  . Cervical fusion c-4-5 other    . Cervical laminectomy other    . Tonsillectomy other    . Umbilical hernia repair    . Axillary seat gland removal other    . Right knee arthroscopy other    . Colectomy  2004    left partial for perforation  . Cholecystectomy    . Tubal ligation    . Ptca and stenting      of mid and distal circumflex coronary artery. 1st stent was 04/2006, with drug eluting stent placed on 12/20/07 (30-50% RCA stenosis). Cardiologist Dr. Einar Gip.  . Dilation and curettage of uterus  02/2011  . Hysteroscopy w/d&c  05/04/2012    Procedure: DILATATION AND CURETTAGE /HYSTEROSCOPY;  Surgeon: Lahoma Crocker, MD;  Location: [facility];  Service: Gynecology;  Laterality: N/A;  . Robotic assisted total hysterectomy with bilateral salpingo oopherectomy  07/24/2012    Procedure:  ROBOTIC ASSISTED TOTAL HYSTERECTOMY WITH BILATERAL SALPINGO OOPHORECTOMY;  Surgeon: Janie Morning, MD PHD;  Location: [facility];  Service: Gynecology;  Laterality: Bilateral;  attempted ,  converted to abdomnial hysterectomy  . Abdominal hysterectomy  07/24/2012    Procedure: HYSTERECTOMY ABDOMINAL;  Surgeon: Janie Morning, MD PHD;  Location: [facility];  Service: Gynecology;;  . Salpingoophorectomy  07/24/2012    Procedure: SALPINGO OOPHORECTOMY;  Surgeon: Janie Morning, MD PHD;  Location: [facility];  Service: Gynecology;  Laterality: Bilateral;  . Abdominal wound dehiscence  08/01/2012    Procedure: ABDOMINAL WOUND DEHISCENCE;  Surgeon: Lahoma Crocker, MD;  Location: [facility];  Service: Gynecology;  Laterality: N/A;   . Laparotomy  08/02/2012    Procedure: EXPLORATORY LAPAROTOMY;  Surgeon: Imogene Burn. Georgette Dover, MD;  Location: [facility];  Service: General;  Laterality: N/A;  placement of abdominal wound vac  . Colonoscopy N/A 12/22/2014    Procedure: COLONOSCOPY;  Surgeon: Juanita Craver, MD;  Location: [facility];  Service: Endoscopy;  Laterality: N/A;   Family History  Problem Relation Age of Onset  . Diabetes Mother   . Coronary artery disease Mother   . Diabetes Sister   . Coronary artery disease Sister    Social History:  reports that she has never smoked. She has never used smokeless tobacco. She reports that she does not drink alcohol or use illicit drugs.  Allergies: No Known Allergies  Medications: I have reviewed the patient's current medications.  Results for orders placed or performed during the hospital encounter of 01/09/15 (from the past 48 hour(s))  CBC     Status: Abnormal   Collection Time: 01/11/15  8:25 AM  Result Value Ref Range   WBC 4.7 4.0 - 10.5 K/uL   RBC 3.61 (L) 3.87 - 5.11 MIL/uL   Hemoglobin 8.0 (L) 12.0 - 15.0 g/dL   HCT 25.4 (L) 36.0 - 46.0 %   MCV 70.4 (L) 78.0 - 100.0 fL   MCH 22.2 (L) 26.0 - 34.0 pg   MCHC 31.5 30.0 - 36.0 g/dL   RDW 15.3 11.5 - 15.5 %   Platelets 230 150 - 400 K/uL  Basic metabolic panel     Status: Abnormal   Collection Time: 01/11/15  8:25 AM  Result Value Ref Range   Sodium 142 135 - 145 mmol/L   Potassium 3.3 (L) 3.5 - 5.1 mmol/L    Comment: DELTA CHECK NOTED   Chloride 112 (H) 101 - 111 mmol/L   CO2 20 (L) 22 - 32 mmol/L   Glucose, Bld 112 (H) 70 - 99 mg/dL   BUN 29 (H) 6 - 20 mg/dL   Creatinine, Ser 2.04 (H) 0.44 - 1.00 mg/dL   Calcium 8.9 8.9 - 10.3 mg/dL   GFR calc non Af Amer 23 (L) >60 mL/min   GFR calc Af Amer 26 (L) >60 mL/min    Comment: (NOTE) The eGFR has been calculated using the CKD EPI equation. This calculation has not been validated in all clinical situations. eGFR's persistently <90 mL/min signify possible Chronic  Kidney Disease.    Anion gap 10 5 - 15  Clostridium Difficile by PCR     Status: None   Collection Time: 01/11/15  9:30 AM  Result Value Ref Range   C difficile by pcr NEGATIVE NEGATIVE  Basic metabolic panel     Status: Abnormal   Collection Time: 01/12/15  5:43 AM  Result Value Ref Range   Sodium 143 135 - 145 mmol/L   Potassium 3.4 (L)  3.5 - 5.1 mmol/L   Chloride 112 (H) 101 - 111 mmol/L   CO2 21 (L) 22 - 32 mmol/L   Glucose, Bld 82 70 - 99 mg/dL   BUN 21 (H) 6 - 20 mg/dL   Creatinine, Ser 1.69 (H) 0.44 - 1.00 mg/dL   Calcium 8.5 (L) 8.9 - 10.3 mg/dL   GFR calc non Af Amer 29 (L) >60 mL/min   GFR calc Af Amer 33 (L) >60 mL/min    Comment: (NOTE) The eGFR has been calculated using the CKD EPI equation. This calculation has not been validated in all clinical situations. eGFR's persistently <90 mL/min signify possible Chronic Kidney Disease.    Anion gap 10 5 - 15  CBC     Status: Abnormal   Collection Time: 01/12/15  5:43 AM  Result Value Ref Range   WBC 4.9 4.0 - 10.5 K/uL    Comment: WHITE COUNT CONFIRMED ON SMEAR   RBC 3.36 (L) 3.87 - 5.11 MIL/uL   Hemoglobin 7.5 (L) 12.0 - 15.0 g/dL   HCT 23.8 (L) 36.0 - 46.0 %   MCV 70.8 (L) 78.0 - 100.0 fL   MCH 22.3 (L) 26.0 - 34.0 pg   MCHC 31.5 30.0 - 36.0 g/dL   RDW 15.0 11.5 - 15.5 %   Platelets 210 150 - 400 K/uL    Comment: PLATELET COUNT CONFIRMED BY SMEAR  Prepare RBC     Status: None   Collection Time: 01/12/15  7:33 AM  Result Value Ref Range   Order Confirmation ORDER PROCESSED BY BLOOD BANK    Review of Systems  Constitutional: Negative.   HENT: Negative.   Eyes: Negative.   Cardiovascular: Negative.   Gastrointestinal: Positive for diarrhea, constipation and blood in stool. Negative for heartburn, nausea, vomiting and abdominal pain.  Musculoskeletal: Positive for joint pain.  Skin: Negative.   Neurological: Negative.   Psychiatric/Behavioral: Negative.    Blood pressure 137/59, pulse 80, temperature 97.8  F (36.6 C), temperature source Oral, resp. rate 19, height $RemoveBe'5\' 4"'oNcBImYTA$  (1.626 m), weight 99.791 kg (220 lb), SpO2 100 %. Physical Exam  Constitutional: She is oriented to person, place, and time. She appears well-developed and well-nourished.  Patient is receiving a unit of PBC's now  HENT:  Head: Normocephalic and atraumatic.  Eyes: Conjunctivae and EOM are normal. Pupils are equal, round, and reactive to light.  Neck: Normal range of motion. Neck supple.  Cardiovascular: Normal rate and regular rhythm.   Respiratory: Effort normal and breath sounds normal.  GI: Soft. Bowel sounds are normal.  Musculoskeletal: Normal range of motion.  Neurological: She is alert and oriented to person, place, and time.  Skin: Skin is warm and dry.  Psychiatric: She has a normal mood and affect. Her behavior is normal. Judgment and thought content normal.   Assessment/Plan: 1) Iron deficiency anemia with recurrent rectal bleeding-will need to rule out gastric ulcer vs AVM vs Dieulafoy's.  2) Pandiverticulosis with hemoclipping on a ?bleeding diverticulum on 12/22/14; history of colonic polyps. 3) GERD on PPI's.  4) AKI-improving.  4) CAD/HTN/Hyperlipidemia. 5) OSA. 6) Gout.  Giavanni Odonovan 01/12/2015, 4:46 PM

## 2015-01-12 NOTE — Progress Notes (Signed)
[facility] PROGRESS NOTE  Marissa [name] HBZ:[id number] DOB: 1941-04-30 DOA: 01/09/2015 PCP: [name], MD  Assessment/Plan: Patient is a 74 year old female with CAD, hypertension, hyperlipidemia, diverticulosis with recurrent GI bleeding was recently discharged on 4/18 when she had presented with rectal bleeding, was followed by gastroenterology. She had a prior admission, 4/9- 4/14, with similar presentation, underwent colonoscopy (Dr. Collene Mares), which showed pandiverticulosis with inspissated stool in several of the diverticula, one bleeding diverticulum which was clipped. The patient went to see her PCP for hospital follow-up and found to have creatinine of 3.38, on 4/27 was 2.69, on 4/18 was 1.3. Hemoglobin was stable, patient was sent to the ER for IV fluid hydration. Patient reported no rectal bleeding up until this morning when she noticed slight blood on the wipe this morning.   1-AKI (acute kidney injury) on chronic kidney disease, stage III - related to lasix, diarrhea. Decrease volume.  - Continue Hold Lasix and Benicar.  -metabolic acidosis improved with bicarb Gtt. Now on NS. -renal US negative for hydronephrosis.  -Strict I and o.  -urine out put 600 -started sodium bicarb.  Renal function improving. Cr trending dow toady at 1.6.   UTI;  Report dysuria. Urine culture growing 100,000  Klebsiella.  Continue with ceftriaxone day 2.   Diverticulosis with GI bleed: No gross GI bleeding  - Currently no gross bleeding.  - Recent colonoscopy on 4/11 had shown pan diverticulosis  -hb at 8.0 Was at 8.5 on 4-18.  -resume ferrous sulfate.  Hb trending. Will transfuse 2 units. Will consul GI.   coronary artery disease - Holding aspirin due to GI bleed, on beta blocker, however BP is currently borderline soft, hold metoprolol and amlodipine, Benicar  - continue statin  Chronic diastolic heart failure,  - Monitor closely for any volume overload  Borderline hypotension with  history of hypertension - Hold beta blocker, Lasix, Benicar, amlodipine  Gout: omplaining of bilateral big toe pain. Will give one time dose of colchicine.    Code Status: Full code.  Family Communication: care discussed with patient.  Disposition Plan: Remain inpatient for treatment of renal failure.    Consultants:  none  Procedures: Renal US; 1. No evidence of hydronephrosis. 2. Increased renal parenchymal echogenicity raises concern for medical renal disease.  3. Small right renal cyst again noted.  Antibiotics:  none  HPI/Subjective: No blood in the stool/ Relates headaches. Feeling dizzy.    Objective: Filed Vitals:   01/12/15 1019  BP: 159/77  Pulse: 74  Temp: 98.6 F (37 C)  Resp: 18    Intake/Output Summary (Last 24 hours) at 01/12/15 1024 Last data filed at 01/12/15 0600  Gross per 24 hour  Intake 2123.33 ml  Output    375 ml  Net 1748.33 ml   Filed Weights   01/10/15 2027 01/11/15 2122  Weight: 98.884 kg (218 lb) 99.791 kg (220 lb)    Exam:   General:  Alert in no distress.  Cardiovascular: S 1, S 2 RRR  Respiratory: CTA, decrease breath sound.   Abdomen: BS present, soft, nt  Musculoskeletal: no edema  Data Reviewed: Basic Metabolic Panel:  Recent Labs Lab 01/07/15 1126 01/09/15 1255 01/10/15 0318 01/11/15 0825 01/12/15 0543  NA 136 141 141 142 143  K 4.6 4.6 4.1 3.3* 3.4*  CL 114* 118* 118* 112* 112*  CO2 15* 13* 13* 20* 21*  GLUCOSE 101* 94 84 112* 82  BUN 31* 44* 41* 29* 21*  CREATININE 2.69* 3.38* 2.70* 2.04* 1.69*  CALCIUM 10.0 9.7 9.2 8.9 8.5*   Liver Function Tests:  Recent Labs Lab 01/09/15 1255  AST 19  ALT 14  ALKPHOS 127*  BILITOT 0.5  PROT 7.8  ALBUMIN 3.9   No results for input(s): LIPASE, AMYLASE in the last 168 hours. No results for input(s): AMMONIA in the last 168 hours. CBC:  Recent Labs Lab 01/07/15 1126 01/09/15 1255 01/09/15 2109 01/10/15 0318 01/11/15 0825 01/12/15 0543  WBC  6.5 6.4  --   --  4.7 4.9  NEUTROABS 5.0  --   --   --   --   --   HGB 9.6* 10.0* 8.8* 8.6* 8.0* 7.5*  HCT 30.2* 32.1* 28.5* 28.0* 25.4* 23.8*  MCV 70.5* 73.0*  --   --  70.4* 70.8*  PLT 303.0 298  --   --  230 210   Cardiac Enzymes: No results for input(s): CKTOTAL, CKMB, CKMBINDEX, TROPONINI in the last 168 hours. BNP (last 3 results)  Recent Labs  11/02/14 2340  BNP 109.2*    ProBNP (last 3 results) No results for input(s): PROBNP in the last 8760 hours.  CBG: No results for input(s): GLUCAP in the last 168 hours.  Recent Results (from the past 240 hour(s))  Urine culture     Status: None   Collection Time: 01/09/15  5:20 PM  Result Value Ref Range Status   Specimen Description URINE, RANDOM  Final   Special Requests NONE  Final   Colony Count   Final    >=100,000 COLONIES/ML Performed at [facility]    Culture   Final    KLEBSIELLA PNEUMONIAE Performed at [facility]    Report Status 01/11/2015 FINAL  Final   Organism ID, Bacteria KLEBSIELLA PNEUMONIAE  Final      Susceptibility   Klebsiella pneumoniae - MIC*    AMPICILLIN RESISTANT      CEFAZOLIN <=4 SENSITIVE Sensitive     CEFTRIAXONE <=1 SENSITIVE Sensitive     CIPROFLOXACIN <=0.25 SENSITIVE Sensitive     GENTAMICIN <=1 SENSITIVE Sensitive     LEVOFLOXACIN <=0.12 SENSITIVE Sensitive     NITROFURANTOIN 32 SENSITIVE Sensitive     TOBRAMYCIN <=1 SENSITIVE Sensitive     TRIMETH/SULFA <=20 SENSITIVE Sensitive     PIP/TAZO <=4 SENSITIVE Sensitive     * KLEBSIELLA PNEUMONIAE  Clostridium Difficile by PCR     Status: None   Collection Time: 01/11/15  9:30 AM  Result Value Ref Range Status   C difficile by pcr NEGATIVE NEGATIVE Final     Studies: No results found.  Scheduled Meds: . sodium chloride   Intravenous Once  . atorvastatin  40 mg Oral q1800  . cefTRIAXone (ROCEPHIN)  IV  1 g Intravenous Q24H  . ferrous sulfate  325 mg Oral BID WC  . pantoprazole  40 mg Oral Daily  . sodium  bicarbonate  650 mg Oral TID  . sodium chloride  3 mL Intravenous Q12H  . cyanocobalamin  2,000 mcg Oral Daily   Continuous Infusions: . sodium chloride 100 mL/hr at 01/12/15 0534    Principal Problem:   AKI (acute kidney injury) Active Problems:   OSA (obstructive sleep apnea)   Essential hypertension   GERD   Chronic kidney disease, stage 3   Hyperlipidemia with target LDL less than 100   Diverticulosis of colon with hemorrhage    Time spent: 35 minutes.     Niel Hummer A  [facility] Pager [phone]. If 7PM-7AM, please contact  night-coverage at [email or web address], password [facility] 01/12/2015, 10:24 AM  LOS: 3 days

## 2015-01-12 NOTE — Progress Notes (Signed)
Occupational Therapy Evaluation Patient Details Name: Marissa [name] MRN: [id number] DOB: 05/11/1941 Today's Date: 01/12/2015    History of Present Illness Patient is a 74 year old female with CAD, hypertension, hyperlipidemia, diverticulosis with recurrent GI bleeding was recently discharged on 4/18 when she had presented with rectal bleeding. Pt admitted for rectal bleeding- Fecal Occult Blood Test was positive   Clinical Impression   Pt close to baseline level of function regarding ADL and mobility for ADL. Pt has 24/7 S available if needed and all necessary DME. No further OT needs. Pt safe to D/C home when medically stable.     Follow Up Recommendations  No OT follow up;Supervision - Intermittent    Equipment Recommendations  None recommended by OT    Recommendations for Other Services       Precautions / Restrictions Precautions Precautions: Fall Precaution Comments: fall risk slight, greatly reduced with UE support      Mobility Bed Mobility Overal bed mobility: Modified Independent                Transfers Overall transfer level: Modified independent                    Balance             Standing balance-Leahy Scale: Good Standing balance comment: able to retrieve items from floor                            ADL Overall ADL's : At baseline                                             Vision     Perception     Praxis      Pertinent Vitals/Pain Pain Assessment: 0-10 Pain Score: 1  Pain Location: around her navel Pain Descriptors / Indicators: Discomfort Pain Intervention(s): Limited activity within patient's tolerance     Hand Dominance     Extremity/Trunk Assessment Upper Extremity Assessment Upper Extremity Assessment: Overall WFL for tasks assessed   Lower Extremity Assessment Lower Extremity Assessment: Defer to PT evaluation   Cervical / Trunk Assessment Cervical / Trunk Assessment:  Normal   Communication Communication Communication: No difficulties   Cognition Arousal/Alertness: Awake/alert Behavior During Therapy: WFL for tasks assessed/performed Overall Cognitive Status: Within Functional Limits for tasks assessed                     General Comments       Exercises       Shoulder Instructions      Home Living Family/patient expects to be discharged to:: Private residence Living Arrangements: Children Available Help at Discharge: Family;Available PRN/intermittently Type of Home: House Home Access: Stairs to enter [employer] of Steps: 1 Entrance Stairs-Rails: None Home Layout: One level     Bathroom Shower/Tub: Occupational psychologist: Standard Bathroom Accessibility: Yes   Home Equipment: Cane - single point;Walker - 2 wheels;Walker - 4 wheels;Bedside commode;Wheelchair - manual;Shower seat - built in   Additional Comments: Pt states that family is there most of time.        Prior Functioning/Environment Level of Independence: Needs assistance  Gait / Transfers Assistance Needed: States she uses cane most of the time but when she isnt' feeling good uses Rollator and at times even uses  wheelchair to roll around in home ADL's / Homemaking Assistance Needed: Pt states she  "did this on her own". If having a bad day, family would help        OT Diagnosis: Generalized weakness   OT Problem List:     OT Treatment/Interventions:      OT Goals(Current goals can be found in the care plan section) Acute Rehab OT Goals Patient Stated Goal: to go home OT Goal Formulation: All assessment and education complete, DC therapy  OT Frequency:     Barriers to D/C:            Co-evaluation              End of Session Nurse Communication: Mobility status  Activity Tolerance: Patient tolerated treatment well Patient left: in bed;with call bell/phone within reach;with bed alarm set   Time: 7106-2694 OT Time  Calculation (min): 16 min Charges:  OT General Charges $OT Visit: 1 Procedure OT Evaluation $Initial OT Evaluation Tier I: 1 Procedure G-Codes:    Skyy Nilan,HILLARY 01-13-2015, 4:34 PM   Maurie Boettcher, OTR/L  [phone] 13-Jan-2015

## 2015-01-13 ENCOUNTER — Encounter (HOSPITAL_COMMUNITY)
Admission: EM | Disposition: A | Discharge status: Home / Self Care | Payer: Self-pay | Source: Home / Self Care | Attending: Internal Medicine

## 2015-01-13 HISTORY — PX: ESOPHAGOGASTRODUODENOSCOPY: SHX5428

## 2015-01-13 LAB — CBC
HCT: 31.1 % — ABNORMAL LOW (ref 36.0–46.0)
Hemoglobin: 9.9 g/dL — ABNORMAL LOW (ref 12.0–15.0)
MCH: 23.3 pg — ABNORMAL LOW (ref 26.0–34.0)
MCHC: 31.8 g/dL (ref 30.0–36.0)
MCV: 73.3 fL — ABNORMAL LOW (ref 78.0–100.0)
PLATELETS: 160 10*3/uL (ref 150–400)
RBC: 4.24 MIL/uL (ref 3.87–5.11)
RDW: 15.4 % (ref 11.5–15.5)
WBC: 5.9 10*3/uL (ref 4.0–10.5)

## 2015-01-13 LAB — TYPE AND SCREEN
ABO/RH(D): B POS
Antibody Screen: NEGATIVE
UNIT DIVISION: 0
Unit division: 0

## 2015-01-13 LAB — BASIC METABOLIC PANEL
ANION GAP: 10 (ref 5–15)
BUN: 15 mg/dL (ref 6–20)
CHLORIDE: 112 mmol/L — AB (ref 101–111)
CO2: 20 mmol/L — AB (ref 22–32)
CREATININE: 1.53 mg/dL — AB (ref 0.44–1.00)
Calcium: 8.9 mg/dL (ref 8.9–10.3)
GFR calc Af Amer: 38 mL/min — ABNORMAL LOW (ref >60)
GFR calc non Af Amer: 32 mL/min — ABNORMAL LOW (ref >60)
Glucose, Bld: 83 mg/dL (ref 70–99)
Potassium: 4 mmol/L (ref 3.5–5.1)
Sodium: 142 mmol/L (ref 135–145)

## 2015-01-13 SURGERY — EGD (ESOPHAGOGASTRODUODENOSCOPY)
Anesthesia: Moderate Sedation | Laterality: Left

## 2015-01-13 MED ORDER — FENTANYL CITRATE (PF) 100 MCG/2ML IJ SOLN
INTRAMUSCULAR | Status: AC
  Filled 2015-01-13: qty 2

## 2015-01-13 MED ORDER — MIDAZOLAM HCL 5 MG/ML IJ SOLN
INTRAMUSCULAR | Status: AC
Start: 2015-01-13 — End: 2015-01-13
  Filled 2015-01-13: qty 2

## 2015-01-13 MED ORDER — FENTANYL CITRATE (PF) 100 MCG/2ML IJ SOLN
INTRAMUSCULAR | Status: DC | PRN
  Administered 2015-01-13 (×2): 25 ug via INTRAVENOUS

## 2015-01-13 MED ORDER — SODIUM CHLORIDE 0.9 % IV SOLN: INTRAVENOUS | Status: DC

## 2015-01-13 MED ORDER — MIDAZOLAM HCL 10 MG/2ML IJ SOLN
INTRAMUSCULAR | Status: DC | PRN
  Administered 2015-01-13 (×2): 2 mg via INTRAVENOUS
  Administered 2015-01-13: 1 mg via INTRAVENOUS

## 2015-01-13 NOTE — Op Note (Signed)
[facility] [place], [address]   ENDOSCOPY PROCEDURE REPORT  PATIENT: Marissa [name], Marissa [name]  MR#: [id number] BIRTHDATE: May 03, 1941 , 74  yrs. old GENDER: female ENDOSCOPIST:Aidon Klemens Benson Norway, MD REFERRED BY: PROCEDURE DATE:  02-05-15 PROCEDURE:   EGD w/ control of bleeding ASA CLASS:    Class III INDICATIONS: Anemia MEDICATION: Fentanyl 50 mcg IV and Versed 5 mg IV TOPICAL ANESTHETIC:   none  DESCRIPTION OF PROCEDURE:   After the risks and benefits of the procedure were explained, informed consent was obtained.  The endoscope was introduced through the mouth  and advanced to the second portion of the duodenum .  The instrument was slowly withdrawn as the mucosa was fully examined. Estimated blood loss is zero unless otherwise noted in this procedure report.   FINDINGS: The esophagus was normal.  The Z-line was normal in the setting of a hiatal hernia.  In the proximal gastric body there were two areas in a background of inflammation that was friable. There was some spontanous oozing.  APC was applied to the areas and some bleeding was precipitated.  Further application of the APC arrested the majority of the bleeding.  Two hemoclips were secured onto two sites there were suspicious for persistent oozing, however, this may have been from trauma with the APC probe. Retroflexed views revealed no abnormalities.    The scope was then withdrawn from the patient and the procedure completed.  COMPLICATIONS: There were no immediate complications.  ENDOSCOPIC IMPRESSION: 1) Hemorrhagic gastritis.  This is the source of her anemia, but I am doubtful that it can explain her complaints of hematochezia. 2) Hiatal hernia.  RECOMMENDATIONS: 1) Follow HGB and transfuse as necessary. 2) Check H. pylori serology.  If positive, treat. 3) Continue with PPI.   _______________________________ eSignedCarol Ada, MD 2015-02-05 2:27  PM     cc:  CPT CODES: ICD CODES:  The ICD and CPT codes recommended by this software are interpretations from the data that the clinical staff has captured with the software.  The verification of the translation of this report to the ICD and CPT codes and modifiers is the sole responsibility of the health care institution and practicing physician where this report was generated.  [employer] will not be held responsible for the validity of the ICD and CPT codes included on this report.  AMA assumes no liability for data contained or not contained herein. CPT is a Designer, television/film set of the [employer].  PATIENT NAME:  Marissa [name], Marissa [name] MR#: [id number]

## 2015-01-13 NOTE — Progress Notes (Signed)
Utilization review complete. Bricyn Labrada RN CCM Case Mgmt phone [phone] 

## 2015-01-13 NOTE — Progress Notes (Signed)
[facility] PROGRESS NOTE  Marissa [name] ZDG:[id number] DOB: 12/19/1940 DOA: 01/09/2015 PCP: [name], MD  Assessment/Plan: Patient is a 74 year old female with CAD, hypertension, hyperlipidemia, diverticulosis with recurrent GI bleeding was recently discharged on 4/18 when she had presented with rectal bleeding, was followed by gastroenterology. She had a prior admission, 4/9- 4/14, with similar presentation, underwent colonoscopy (Dr. Collene Mares), which showed pandiverticulosis with inspissated stool in several of the diverticula, one bleeding diverticulum which was clipped. The patient went to see her PCP for hospital follow-up and found to have creatinine of 3.38, on 4/27 was 2.69, on 4/18 was 1.3. Hemoglobin was stable, patient was sent to the ER for IV fluid hydration. Patient reported no rectal bleeding up until this morning when she noticed slight blood on the wipe this morning.   1-AKI (acute kidney injury) on chronic kidney disease, stage III -Related to lasix, diarrhea. Decrease volume.  -Continue Hold Lasix and Benicar.  -Metabolic acidosis improved with bicarb Gtt. Now on NS. -Renal US negative for hydronephrosis.  -Strict I and o.  -Urine out put likely not accurrate.  -Started sodium bicarb.  Renal function improving. Cr trending dow toady at 1.5, cr peak to 3.3.   UTI;  Report dysuria. Urine culture growing 100,000  Klebsiella. Sensitive to ceftriaxone.  Continue with ceftriaxone day 3.   Diverticulosis with GI bleed: No gross GI bleeding  -Currently no gross bleeding.  -Recent colonoscopy on 4/11 had shown pan diverticulosis  -Hb was trending down. GI reconsulted. Endoscopy 5-03.  -resume ferrous sulfate.  -S/P 2 units of PRBC 5-02.   Acute blood loss anemia;  Last admission diagnosed with diverticular bleed.  Hb trending down,. No significant hematochezia other than small amount on admission.  GI consulted. Endoscopy showed hemorrhagic gastritis.  S/P 2 units  of PRBC 5-02. On Protonix.  Hb increase to 9 from 7 after two units of PRBC. Marland Kitchen   coronary artery disease - Holding aspirin due to GI bleed, on beta blocker, however BP is currently borderline soft, hold metoprolol and amlodipine, Benicar  - continue statin  Chronic diastolic heart failure,  - Monitor closely for any volume overload  Borderline hypotension with history of hypertension - Hold beta blocker, Lasix, Benicar, amlodipine  Gout: omplaining of bilateral big toe pain. Received one time dose of colchicine.    Code Status: Full code.  Family Communication: care discussed with patient.  Disposition Plan: Remain inpatient for treatment of renal failure.    Consultants:  Dr Benson Norway, Dr man.   Procedures: Renal US; 1. No evidence of hydronephrosis. 2. Increased renal parenchymal echogenicity raises concern for medical renal disease.  3. Small right renal cyst again noted.  Antibiotics:  Ceftriaxone.   HPI/Subjective: No blood in the stool/ Feeling better today. No more dizziness.    Objective: Filed Vitals:   01/13/15 1432  BP: 202/77  Pulse:   Temp: 97.8 F (36.6 C)  Resp: 17    Intake/Output Summary (Last 24 hours) at 01/13/15 1438 Last data filed at 01/13/15 0945  Gross per 24 hour  Intake 2913.75 ml  Output     77 ml  Net 2836.75 ml   Filed Weights   01/10/15 2027 01/11/15 2122 01/12/15 2106  Weight: 98.884 kg (218 lb) 99.791 kg (220 lb) 99.3 kg (218 lb 14.7 oz)    Exam:   General:  Alert in no distress.  Cardiovascular: S 1, S 2 RRR  Respiratory: CTA, decrease breath sound.   Abdomen: BS present, soft, nt  Musculoskeletal: no edema  Data Reviewed: Basic Metabolic Panel:  Recent Labs Lab 01/09/15 1255 01/10/15 0318 01/11/15 0825 01/12/15 0543 01/13/15 0718  NA 141 141 142 143 142  K 4.6 4.1 3.3* 3.4* 4.0  CL 118* 118* 112* 112* 112*  CO2 13* 13* 20* 21* 20*  GLUCOSE 94 84 112* 82 83  BUN 44* 41* 29* 21* 15  CREATININE  3.38* 2.70* 2.04* 1.69* 1.53*  CALCIUM 9.7 9.2 8.9 8.5* 8.9   Liver Function Tests:  Recent Labs Lab 01/09/15 1255  AST 19  ALT 14  ALKPHOS 127*  BILITOT 0.5  PROT 7.8  ALBUMIN 3.9   No results for input(s): LIPASE, AMYLASE in the last 168 hours. No results for input(s): AMMONIA in the last 168 hours. CBC:  Recent Labs Lab 01/07/15 1126 01/09/15 1255 01/09/15 2109 01/10/15 0318 01/11/15 0825 01/12/15 0543 01/13/15 0718  WBC 6.5 6.4  --   --  4.7 4.9 5.9  NEUTROABS 5.0  --   --   --   --   --   --   HGB 9.6* 10.0* 8.8* 8.6* 8.0* 7.5* 9.9*  HCT 30.2* 32.1* 28.5* 28.0* 25.4* 23.8* 31.1*  MCV 70.5* 73.0*  --   --  70.4* 70.8* 73.3*  PLT 303.0 298  --   --  230 210 160   Cardiac Enzymes: No results for input(s): CKTOTAL, CKMB, CKMBINDEX, TROPONINI in the last 168 hours. BNP (last 3 results)  Recent Labs  11/02/14 2340  BNP 109.2*    ProBNP (last 3 results) No results for input(s): PROBNP in the last 8760 hours.  CBG: No results for input(s): GLUCAP in the last 168 hours.  Recent Results (from the past 240 hour(s))  Urine culture     Status: None   Collection Time: 01/09/15  5:20 PM  Result Value Ref Range Status   Specimen Description URINE, RANDOM  Final   Special Requests NONE  Final   Colony Count   Final    >=100,000 COLONIES/ML Performed at [facility]    Culture   Final    KLEBSIELLA PNEUMONIAE Performed at [facility]    Report Status 01/11/2015 FINAL  Final   Organism ID, Bacteria KLEBSIELLA PNEUMONIAE  Final      Susceptibility   Klebsiella pneumoniae - MIC*    AMPICILLIN RESISTANT      CEFAZOLIN <=4 SENSITIVE Sensitive     CEFTRIAXONE <=1 SENSITIVE Sensitive     CIPROFLOXACIN <=0.25 SENSITIVE Sensitive     GENTAMICIN <=1 SENSITIVE Sensitive     LEVOFLOXACIN <=0.12 SENSITIVE Sensitive     NITROFURANTOIN 32 SENSITIVE Sensitive     TOBRAMYCIN <=1 SENSITIVE Sensitive     TRIMETH/SULFA <=20 SENSITIVE Sensitive      PIP/TAZO <=4 SENSITIVE Sensitive     * KLEBSIELLA PNEUMONIAE  Clostridium Difficile by PCR     Status: None   Collection Time: 01/11/15  9:30 AM  Result Value Ref Range Status   C difficile by pcr NEGATIVE NEGATIVE Final     Studies: No results found.  Scheduled Meds: . [MAR Hold] atorvastatin  40 mg Oral q1800  . [MAR Hold] cefTRIAXone (ROCEPHIN)  IV  1 g Intravenous Q24H  . [MAR Hold] ferrous sulfate  325 mg Oral BID WC  . [MAR Hold] pantoprazole  40 mg Oral Daily  . [MAR Hold] sodium bicarbonate  650 mg Oral TID  . [MAR Hold] sodium chloride  3 mL Intravenous Q12H  . [MAR Hold] cyanocobalamin  2,000 mcg Oral Daily   Continuous Infusions: . sodium chloride 75 mL/hr at 01/13/15 1155  . sodium chloride      Principal Problem:   AKI (acute kidney injury) Active Problems:   OSA (obstructive sleep apnea)   Essential hypertension   GERD   Chronic kidney disease, stage 3   Hyperlipidemia with target LDL less than 100   Diverticulosis of colon with hemorrhage    Time spent: 35 minutes.     Niel Hummer A  [facility] Pager [phone]. If 7PM-7AM, please contact night-coverage at [email or web address], password [facility] 01/13/2015, 2:38 PM  LOS: 4 days

## 2015-01-14 ENCOUNTER — Encounter (HOSPITAL_COMMUNITY): Payer: Self-pay | Admitting: Gastroenterology

## 2015-01-14 DIAGNOSIS — D62 Acute posthemorrhagic anemia: Secondary | ICD-10-CM

## 2015-01-14 LAB — BASIC METABOLIC PANEL
Anion gap: 9 (ref 5–15)
BUN: 10 mg/dL (ref 6–20)
CO2: 23 mmol/L (ref 22–32)
Calcium: 8.7 mg/dL — ABNORMAL LOW (ref 8.9–10.3)
Chloride: 110 mmol/L (ref 101–111)
Creatinine, Ser: 1.44 mg/dL — ABNORMAL HIGH (ref 0.44–1.00)
GFR calc Af Amer: 40 mL/min — ABNORMAL LOW (ref >60)
GFR, EST NON AFRICAN AMERICAN: 35 mL/min — AB (ref >60)
GLUCOSE: 79 mg/dL (ref 70–99)
Potassium: 3.5 mmol/L (ref 3.5–5.1)
SODIUM: 142 mmol/L (ref 135–145)

## 2015-01-14 LAB — CBC
HCT: 30.9 % — ABNORMAL LOW (ref 36.0–46.0)
Hemoglobin: 9.8 g/dL — ABNORMAL LOW (ref 12.0–15.0)
MCH: 23.6 pg — ABNORMAL LOW (ref 26.0–34.0)
MCHC: 31.7 g/dL (ref 30.0–36.0)
MCV: 74.3 fL — ABNORMAL LOW (ref 78.0–100.0)
Platelets: 170 10*3/uL (ref 150–400)
RBC: 4.16 MIL/uL (ref 3.87–5.11)
RDW: 15.4 % (ref 11.5–15.5)
WBC: 5.8 10*3/uL (ref 4.0–10.5)

## 2015-01-14 MED ORDER — METOPROLOL TARTRATE 25 MG PO TABS
25.0000 mg | ORAL_TABLET | Freq: Two times a day (BID) | ORAL | Status: DC
  Administered 2015-01-14 – 2015-01-15 (×3): 25 mg via ORAL
  Filled 2015-01-14 (×4): qty 1

## 2015-01-14 MED ORDER — AMLODIPINE BESYLATE 10 MG PO TABS
10.0000 mg | ORAL_TABLET | Freq: Every day | ORAL | Status: DC
  Administered 2015-01-14 – 2015-01-15 (×2): 10 mg via ORAL
  Filled 2015-01-14 (×2): qty 1

## 2015-01-14 NOTE — Progress Notes (Signed)
Medicare Important Message Given: Yes Date Medicare IM Given: 01/12/15 Medicare IM give by: Jonnie Finner RN CCM Date Additional Medicare IM Given:   Additional Medicare Important Message give by:    If discussed at [place] of Stay Meetings, dates discussed:   Additional Comments: 01/14/2015 1500 NCM spoke to dtr, Lilly and states she does not want HH PT for pt. States she is at home to assist with her care. Jonnie Finner RN CCM Case Mgmt phone [phone]  01/12/2015 1125 NCM spoke to pt and she has wheelchair, 3n1 and Rolling Walker. Lives at home with dtr, [name] (daughter) [phone]. Waiting final recommendations for home. Jonnie Finner RN CCM Case Mgmt phone [phone]

## 2015-01-14 NOTE — Progress Notes (Signed)
[facility] PROGRESS NOTE  Marissa [name] FVC:[id number] DOB: [birth year] DOA: 01/09/2015 PCP: [name], MD  Assessment/Plan: Patient is a 74 year old female with CAD, hypertension, hyperlipidemia, diverticulosis with recurrent GI bleeding was recently discharged on 4/18 when she had presented with rectal bleeding, was followed by gastroenterology. She had a prior admission, 4/9- 4/14, with similar presentation, underwent colonoscopy (Dr. Collene Mares), which showed pandiverticulosis with inspissated stool in several of the diverticula, one bleeding diverticulum which was clipped. The patient went to see her PCP for hospital follow-up and found to have creatinine of 3.38, on 4/27 was 2.69, on 4/18 was 1.3. Hemoglobin was stable, patient was sent to the ER for IV fluid hydration. Patient reported no rectal bleeding up until this morning when she noticed slight blood on the wipe this morning.   1-AKI (acute kidney injury) on chronic kidney disease, stage III -Due to lasix, ARB and diarrhea -improved, held Lasix and Benicar.  -Renal US negative for hydronephrosis.  -stop IVF and bicarb, creatinine back at baseline  2. Klebsiella UTI;  -Continue with ceftriaxone day 4/5, stop after tomorrow  3. Diverticulosis with GI bleed: No gross GI bleeding  -Currently no gross bleeding, but noted on admission with anemia  -Recent colonoscopy on 4/11 had shown pan diverticulosis  - GI was reconsulted. Endoscopy 5-03 with hemorrhagic gastritis -S/P 2 units of PRBC 5-02, hb stable  4. Acute blood loss anemia;  -Last admission diagnosed with diverticular bleed.  -Hb trending down,. No significant hematochezia other than small amount on admission.  -GI consulted. Endoscopy showed hemorrhagic gastritis.  -S/P 2 units of PRBC 5-02. -continue Protonix, monitor Hb.  .  5. Coronary artery disease - Holding aspirin due to GI bleed, -restart metorpolol and amlodipine, hold Benicar  - continue  statin  Chronic diastolic heart failure,  - Monitor closely for any volume overload  Borderline hypotension with history of hypertension - Hold Lasix, Benicar, restart amlodipine and metoprolol  Gout:  -noted to have bilateral big toe pain. Received one time dose of colchicine.  -improved   Code Status: Full code.  Family Communication: care discussed with patient.  Disposition Plan: Remain inpatient for treatment of renal failure.    Consultants:  Dr Benson Norway, Dr man.   Procedures: Renal US; 1. No evidence of hydronephrosis. 2. Increased renal parenchymal echogenicity raises concern for medical renal disease.  3. Small right renal cyst again noted.  Antibiotics:  Ceftriaxone.   HPI/Subjective: Feels well, no more bleeding, Bm this am  Objective: Filed Vitals:   01/14/15 0942  BP: 176/84  Pulse: 77  Temp: 98.1 F (36.7 C)  Resp: 17    Intake/Output Summary (Last 24 hours) at 01/14/15 1311 Last data filed at 01/14/15 0646  Gross per 24 hour  Intake   1320 ml  Output    802 ml  Net    518 ml   Filed Weights   01/10/15 2027 01/11/15 2122 01/12/15 2106  Weight: 98.884 kg (218 lb) 99.791 kg (220 lb) 99.3 kg (218 lb 14.7 oz)    Exam:   General:  Alert in no distress, AAOx3  Cardiovascular: S 1, S 2 RRR  Respiratory: CTAB  Abdomen: BS present, soft, nt  Musculoskeletal: no edema  Data Reviewed: Basic Metabolic Panel:  Recent Labs Lab 01/10/15 0318 01/11/15 0825 01/12/15 0543 01/13/15 0718 01/14/15 0447  NA 141 142 143 142 142  K 4.1 3.3* 3.4* 4.0 3.5  CL 118* 112* 112* 112* 110  CO2 13* 20* 21* 20* 23  GLUCOSE 84 112* 82 83 79  BUN 41* 29* 21* 15 10  CREATININE 2.70* 2.04* 1.69* 1.53* 1.44*  CALCIUM 9.2 8.9 8.5* 8.9 8.7*   Liver Function Tests:  Recent Labs Lab 01/09/15 1255  AST 19  ALT 14  ALKPHOS 127*  BILITOT 0.5  PROT 7.8  ALBUMIN 3.9   No results for input(s): LIPASE, AMYLASE in the last 168 hours. No results for  input(s): AMMONIA in the last 168 hours. CBC:  Recent Labs Lab 01/09/15 1255  01/10/15 0318 01/11/15 0825 01/12/15 0543 01/13/15 0718 01/14/15 0447  WBC 6.4  --   --  4.7 4.9 5.9 5.8  HGB 10.0*  < > 8.6* 8.0* 7.5* 9.9* 9.8*  HCT 32.1*  < > 28.0* 25.4* 23.8* 31.1* 30.9*  MCV 73.0*  --   --  70.4* 70.8* 73.3* 74.3*  PLT 298  --   --  230 210 160 170  < > = values in this interval not displayed. Cardiac Enzymes: No results for input(s): CKTOTAL, CKMB, CKMBINDEX, TROPONINI in the last 168 hours. BNP (last 3 results)  Recent Labs  11/02/14 2340  BNP 109.2*    ProBNP (last 3 results) No results for input(s): PROBNP in the last 8760 hours.  CBG: No results for input(s): GLUCAP in the last 168 hours.  Recent Results (from the past 240 hour(s))  Urine culture     Status: None   Collection Time: 01/09/15  5:20 PM  Result Value Ref Range Status   Specimen Description URINE, RANDOM  Final   Special Requests NONE  Final   Colony Count   Final    >=100,000 COLONIES/ML Performed at [facility]    Culture   Final    KLEBSIELLA PNEUMONIAE Performed at [facility]    Report Status 01/11/2015 FINAL  Final   Organism ID, Bacteria KLEBSIELLA PNEUMONIAE  Final      Susceptibility   Klebsiella pneumoniae - MIC*    AMPICILLIN RESISTANT      CEFAZOLIN <=4 SENSITIVE Sensitive     CEFTRIAXONE <=1 SENSITIVE Sensitive     CIPROFLOXACIN <=0.25 SENSITIVE Sensitive     GENTAMICIN <=1 SENSITIVE Sensitive     LEVOFLOXACIN <=0.12 SENSITIVE Sensitive     NITROFURANTOIN 32 SENSITIVE Sensitive     TOBRAMYCIN <=1 SENSITIVE Sensitive     TRIMETH/SULFA <=20 SENSITIVE Sensitive     PIP/TAZO <=4 SENSITIVE Sensitive     * KLEBSIELLA PNEUMONIAE  Clostridium Difficile by PCR     Status: None   Collection Time: 01/11/15  9:30 AM  Result Value Ref Range Status   C difficile by pcr NEGATIVE NEGATIVE Final     Studies: No results found.  Scheduled Meds: . atorvastatin  40 mg  Oral q1800  . cefTRIAXone (ROCEPHIN)  IV  1 g Intravenous Q24H  . ferrous sulfate  325 mg Oral BID WC  . pantoprazole  40 mg Oral Daily  . sodium chloride  3 mL Intravenous Q12H  . cyanocobalamin  2,000 mcg Oral Daily   Continuous Infusions:    Principal Problem:   AKI (acute kidney injury) Active Problems:   OSA (obstructive sleep apnea)   Essential hypertension   GERD   Chronic kidney disease, stage 3   Hyperlipidemia with target LDL less than 100   Diverticulosis of colon with hemorrhage   Acute blood loss anemia    Time spent: 35 minutes.     [facility]  [facility] Pager [phone]. If 7PM-7AM, please contact  night-coverage at [email or web address], password [facility] 01/14/2015, 1:11 PM  LOS: 5 days

## 2015-01-14 NOTE — Progress Notes (Signed)
Subjective: Since I last evaluated the patient, she seems to be doing fairly well. Denies any GI problems at this time. Had 2 BM's today.  Objective: Vital signs in last 24 hours: Temp:  [97.7 F (36.5 C)-98.7 F (37.1 C)] 98.1 F (36.7 C) (05/04 0942) Pulse Rate:  [69-77] 77 (05/04 0942) Resp:  [16-18] 17 (05/04 0942) BP: (159-178)/(66-84) 176/84 mmHg (05/04 0942) SpO2:  [99 %-100 %] 99 % (05/04 0942) Last BM Date: 01/13/15  Intake/Output from previous day: 05/03 0701 - 05/04 0700 In: 0762 [P.O.:420; I.V.:975] Out: 804 [Urine:801; Stool:3] Intake/Output this shift: Total I/O In: 245 [P.O.:120; I.V.:75; IV Piggyback:50] Out: -   General appearance: alert, cooperative, no distress and morbidly obese Resp: clear to auscultation bilaterally Cardio: regular rate and rhythm, S1, S2 normal, no murmur, click, rub or gallop GI: soft, non-tender; bowel sounds normal; no masses,  no organomegaly  Lab Results:  Recent Labs  01/12/15 0543 01/13/15 0718 01/14/15 0447  WBC 4.9 5.9 5.8  HGB 7.5* 9.9* 9.8*  HCT 23.8* 31.1* 30.9*  PLT 210 160 170   BMET  Recent Labs  01/12/15 0543 01/13/15 0718 01/14/15 0447  NA 143 142 142  K 3.4* 4.0 3.5  CL 112* 112* 110  CO2 21* 20* 23  GLUCOSE 82 83 79  BUN 21* 15 10  CREATININE 1.69* 1.53* 1.44*  CALCIUM 8.5* 8.9 8.7*   Medications: I have reviewed the patient's current medications.  Assessment/Plan: Iron deficiency anemia with blood in stool: has a history of diverticulosis and antral gastritis and hiatal hernia. Hemoglobin is stable. May be ready for discharge tomorrow.  LOS: 5 days   Rori Goar 01/14/2015, 5:31 PM

## 2015-01-15 LAB — CBC
HCT: 32.7 % — ABNORMAL LOW (ref 36.0–46.0)
Hemoglobin: 10.2 g/dL — ABNORMAL LOW (ref 12.0–15.0)
MCH: 23.3 pg — ABNORMAL LOW (ref 26.0–34.0)
MCHC: 31.2 g/dL (ref 30.0–36.0)
MCV: 74.8 fL — ABNORMAL LOW (ref 78.0–100.0)
Platelets: 157 10*3/uL (ref 150–400)
RBC: 4.37 MIL/uL (ref 3.87–5.11)
RDW: 15.4 % (ref 11.5–15.5)
WBC: 7.2 10*3/uL (ref 4.0–10.5)

## 2015-01-15 LAB — BASIC METABOLIC PANEL
Anion gap: 8 (ref 5–15)
BUN: 8 mg/dL (ref 6–20)
CO2: 23 mmol/L (ref 22–32)
Calcium: 8.5 mg/dL — ABNORMAL LOW (ref 8.9–10.3)
Chloride: 111 mmol/L (ref 101–111)
Creatinine, Ser: 1.45 mg/dL — ABNORMAL HIGH (ref 0.44–1.00)
GFR calc Af Amer: 40 mL/min — ABNORMAL LOW (ref >60)
GFR calc non Af Amer: 35 mL/min — ABNORMAL LOW (ref >60)
GLUCOSE: 77 mg/dL (ref 70–99)
Potassium: 3.3 mmol/L — ABNORMAL LOW (ref 3.5–5.1)
SODIUM: 142 mmol/L (ref 135–145)

## 2015-01-15 LAB — H. PYLORI ANTIBODY, IGG: H Pylori IgG: <0.9 U/mL (ref 0.0–0.8)

## 2015-01-15 MED ORDER — POTASSIUM CHLORIDE CRYS ER 20 MEQ PO TBCR
40.0000 meq | EXTENDED_RELEASE_TABLET | Freq: Once | ORAL | Status: AC
  Administered 2015-01-15: 40 meq via ORAL
  Filled 2015-01-15: qty 2

## 2015-01-15 NOTE — Discharge Summary (Signed)
Physician Discharge Summary  Marissa [name] DXA:[id number] DOB: Sep 14, 1940 DOA: 01/09/2015  PCP: [name], MD  Admit date: 01/09/2015 Discharge date: 01/15/2015  Time spent: 45 minutes  Recommendations for Outpatient Follow-up:  1. PCP Dr.Jones in 1 week, please check CBC in 1 week  Discharge Diagnoses:  Principal Problem:   AKI (acute kidney injury)   Hemorrhagic gastritis:   OSA (obstructive sleep apnea)   Essential hypertension   GERD   Chronic kidney disease, stage 3   Hyperlipidemia with target LDL less than 100   Diverticulosis of colon with hemorrhage   Acute blood loss anemia   Discharge Condition: stable  Diet recommendation: low sodium  Filed Weights   01/11/15 2122 01/12/15 2106 01/14/15 2124  Weight: 99.791 kg (220 lb) 99.3 kg (218 lb 14.7 oz) 101.152 kg (223 lb)    History of present illness:  Chief Complaint:  Sent from PCPs office for abnormal labs HPI: Patient is a 74 year old female with CAD, hypertension, hyperlipidemia, diverticulosis with recurrent GI bleeding was recently discharged on 4/18 when she had presented with rectal bleeding, was followed by gastroenterology. She had a prior admission, 4/9- 4/14, with similar presentation, underwent colonoscopy (Dr. Collene Mares), which showed pandiverticulosis with inspissated stool in several of the diverticula, one bleeding diverticulum which was clipped. The patient went to see her PCP for hospital follow-up and found to have creatinine of 3.38, on 4/27 was 2.69, on 4/18 was 1.3. Hemoglobin was stable, patient was sent to the ER   Patient reported no rectal bleeding up until 4/29 morning when she noticed slight blood on the toilet paper this morning. FOBT was positive   Hospital Course:  -AKI (acute kidney injury) on chronic kidney disease, stage III -Due to lasix, ARB and GI losses -improved, held Lasix and Benicar.  -Renal US negative for hydronephrosis.  -improved with IVf and stopping ARB -creatinine back  at baseline, 1.4 at discharge  2. Klebsiella UTI;  -completed 5day course of rocephin  3. Diverticulosis with GI bleed: No gross GI bleeding  -Currently no gross bleeding, but noted on admission with anemia  -Recent colonoscopy on 4/11 had shown pan diverticulosis  -GI was reconsulted. Endoscopy 5-03 with hemorrhagic gastritis -S/P 2 units of PRBC 5-02, hb stable  4. Acute blood loss anemia;  -Last admission diagnosed with diverticular bleed.  -No significant hematochezia other than small amount on admission.  -GI Dr.Hung, consulted. Endoscopy showed hemorrhagic gastritis.  -S/P 2 units of PRBC 5-02. -continued on Protonix, Hb stable since  .  5. Coronary artery disease - Held aspirin due to GI bleed, can resume in 1-2weeks if no further bleeding -restarted metorpolol and amlodipine, held Benicar due to AKI - continue statin  6. Chronic diastolic heart failure,  - stable, lasix resumed  7. Borderline hypotension with history of hypertension - Stopped Benicar, restarted amlodipine and metoprolol  8. Gout:  -noted to have bilateral big toe pain. Received one time dose of colchicine.  -improved   Consultants:  GI -Dr Benson Norway, Dr Collene Mares.  Procedures: ENDOSCOPIC IMPRESSION: Hemorrhagic gastritis  Consultations:  Dr.Hung and Mann  Discharge Exam: Filed Vitals:   01/15/15 0924  BP: 158/77  Pulse: 79  Temp: 97.9 F (36.6 C)  Resp: 17    General: AAOx3 Cardiovascular: S1S2/RRR Respiratory: CTAB  Discharge Instructions   Discharge Instructions    Diet - low sodium heart healthy    Complete by:  As directed      Increase activity slowly    Complete by:  As directed           Discharge Medication List as of 01/15/2015 12:34 PM    CONTINUE these medications which have NOT CHANGED   Details  amLODipine (NORVASC) 10 MG tablet Take 1 tablet (10 mg total) by mouth daily., Starting 05/29/2014, Until Discontinued, Normal    atorvastatin (LIPITOR) 40 MG  tablet Take 1 tablet (40 mg total) by mouth daily., Starting 12/25/2014, Until Discontinued, Normal    colchicine 0.6 MG tablet Take 1 tablet (0.6 mg total) by mouth 2 (two) times daily., Starting 12/25/2014, Until Discontinued, Normal    cyanocobalamin 2000 MCG tablet Take 1 tablet (2,000 mcg total) by mouth daily., Starting 01/07/2015, Until Discontinued, Normal    erythromycin ophthalmic ointment Starting 11/26/2014, Until Discontinued, Historical Med    Ferrous Sulfate (IRON) 325 (65 FE) MG TABS Take 325 mg by mouth 2 (two) times daily., Starting 12/25/2014, Until Discontinued, Normal    furosemide (LASIX) 20 MG tablet Take 20 mg by mouth daily as needed for fluid. , Starting 11/03/2014, Until Discontinued, Historical Med    metoprolol tartrate (LOPRESSOR) 25 MG tablet Take 1 tablet (25 mg total) by mouth every morning., Starting 12/25/2014, Until Discontinued, Normal    pantoprazole (PROTONIX) 40 MG tablet Take 1 tablet (40 mg total) by mouth daily., Starting 12/25/2014, Until Discontinued, Normal    VOLTAREN 1 % GEL Starting 12/25/2014, Until Discontinued, Historical Med    nitroGLYCERIN (NITROSTAT) 0.4 MG SL tablet Place 1 tablet (0.4 mg total) under the tongue every 5 (five) minutes as needed for chest pain., Starting 02/26/2013, Until Discontinued, Normal      STOP taking these medications     BENICAR 20 MG tablet        No Known Allergies Follow-up Information    Follow up with [name], MD.   Specialty:  Internal Medicine   Contact information:   [address] [address] [phone]        The results of significant diagnostics from this hospitalization (including imaging, microbiology, ancillary and laboratory) are listed below for reference.    Significant Diagnostic Studies: Ct Head Wo Contrast  12/28/2014   CLINICAL DATA:  Hypertension and ventricular tachycardia.  EXAM: CT HEAD WITHOUT CONTRAST  TECHNIQUE: Contiguous axial images were  obtained from the base of the skull through the vertex without intravenous contrast.  COMPARISON:  01/12/2012  FINDINGS: Stable age related cerebral atrophy, ventriculomegaly and periventricular white matter disease. No extra-axial fluid collections are identified. No CT findings for acute hemispheric infarction or intracranial hemorrhage. No mass lesions. The brainstem and cerebellum are normal.  No acute bony findings. The paranasal sinuses and mastoid air cells are clear. The globes are intact.  IMPRESSION: No acute intracranial findings.   Electronically Signed   By: Marijo Sanes M.D.   On: 12/28/2014 10:04   US Renal  01/10/2015   CLINICAL DATA:  Acute onset of renal insufficiency. Initial encounter.  EXAM: RENAL / URINARY TRACT ULTRASOUND COMPLETE  COMPARISON:  CT of the abdomen and pelvis performed 08/10/2012  FINDINGS: Right Kidney:  Length: 11.4 cm. Increased parenchymal echogenicity noted. A 1.6 x 1.5 x 1.4 cm cyst is noted near the upper pole of the right kidney. No hydronephrosis visualized.  Left Kidney:  Length: 10.4 cm. Echogenicity within normal limits. No mass or hydronephrosis visualized.  Bladder:  Decompressed and not well assessed.  IMPRESSION: 1. No evidence of hydronephrosis. 2. Increased renal parenchymal echogenicity raises concern for medical renal  disease. 3. Small right renal cyst again noted.   Electronically Signed   By: Garald Balding M.D.   On: 01/10/2015 06:29   Dg Chest Port 1 View  12/27/2014   CLINICAL DATA:  Chest pain  EXAM: PORTABLE CHEST - 1 VIEW  COMPARISON:  11/02/2014  FINDINGS: Mild cardiomegaly is stable from previous. Aortic and hilar contours are stable and negative. There is no edema, consolidation, effusion, or pneumothorax.  IMPRESSION: No active disease.   Electronically Signed   By: Monte Fantasia M.D.   On: 12/27/2014 05:02    Microbiology: Recent Results (from the past 240 hour(s))  Urine culture     Status: None   Collection Time: 01/09/15  5:20 PM   Result Value Ref Range Status   Specimen Description URINE, RANDOM  Final   Special Requests NONE  Final   Colony Count   Final    >=100,000 COLONIES/ML Performed at [facility]    Culture   Final    KLEBSIELLA PNEUMONIAE Performed at [facility]    Report Status 01/11/2015 FINAL  Final   Organism ID, Bacteria KLEBSIELLA PNEUMONIAE  Final      Susceptibility   Klebsiella pneumoniae - MIC*    AMPICILLIN RESISTANT      CEFAZOLIN <=4 SENSITIVE Sensitive     CEFTRIAXONE <=1 SENSITIVE Sensitive     CIPROFLOXACIN <=0.25 SENSITIVE Sensitive     GENTAMICIN <=1 SENSITIVE Sensitive     LEVOFLOXACIN <=0.12 SENSITIVE Sensitive     NITROFURANTOIN 32 SENSITIVE Sensitive     TOBRAMYCIN <=1 SENSITIVE Sensitive     TRIMETH/SULFA <=20 SENSITIVE Sensitive     PIP/TAZO <=4 SENSITIVE Sensitive     * KLEBSIELLA PNEUMONIAE  Clostridium Difficile by PCR     Status: None   Collection Time: 01/11/15  9:30 AM  Result Value Ref Range Status   C difficile by pcr NEGATIVE NEGATIVE Final     Labs: Basic Metabolic Panel:  Recent Labs Lab 01/11/15 0825 01/12/15 0543 01/13/15 0718 01/14/15 0447 01/15/15 0442  NA 142 143 142 142 142  K 3.3* 3.4* 4.0 3.5 3.3*  CL 112* 112* 112* 110 111  CO2 20* 21* 20* 23 23  GLUCOSE 112* 82 83 79 77  BUN 29* 21* 15 10 8   CREATININE 2.04* 1.69* 1.53* 1.44* 1.45*  CALCIUM 8.9 8.5* 8.9 8.7* 8.5*   Liver Function Tests:  Recent Labs Lab 01/09/15 1255  AST 19  ALT 14  ALKPHOS 127*  BILITOT 0.5  PROT 7.8  ALBUMIN 3.9   No results for input(s): LIPASE, AMYLASE in the last 168 hours. No results for input(s): AMMONIA in the last 168 hours. CBC:  Recent Labs Lab 01/11/15 0825 01/12/15 0543 01/13/15 0718 01/14/15 0447 01/15/15 0442  WBC 4.7 4.9 5.9 5.8 7.2  HGB 8.0* 7.5* 9.9* 9.8* 10.2*  HCT 25.4* 23.8* 31.1* 30.9* 32.7*  MCV 70.4* 70.8* 73.3* 74.3* 74.8*  PLT 230 210 160 170 157   Cardiac Enzymes: No results for input(s):  CKTOTAL, CKMB, CKMBINDEX, TROPONINI in the last 168 hours. BNP: BNP (last 3 results)  Recent Labs  11/02/14 2340  BNP 109.2*    ProBNP (last 3 results) No results for input(s): PROBNP in the last 8760 hours.  CBG: No results for input(s): GLUCAP in the last 168 hours.     SignedDomenic Polite  [facility] 01/15/2015, 4:01 PM

## 2015-01-15 NOTE — Progress Notes (Signed)
Physical Therapy Treatment Patient Details Name: Marissa [name] MRN: [id number] DOB: [birth year] Today's Date: 01/15/2015    History of Present Illness Patient is a 74 year old female with CAD, hypertension, hyperlipidemia, diverticulosis with recurrent GI bleeding was recently discharged on 4/18 when she had presented with rectal bleeding, was followed by gastroenterology. She had a prior admission, 4/9- 4/14, with similar presentation, underwent colonoscopy (Dr. Collene Mares), which showed pandiverticulosis with inspissated stool in several of the diverticula, one bleeding diverticulum which was clipped. The patient went to see her PCP for hospital follow-up and found to have creatinine of 3.38, on 4/27 was 2.69, on 4/18 was 1.3. Hemoglobin was stable, patient was sent to the ER for IV fluid hydration. Patient reported no rectal bleeding up until this morning when she noticed slight blood on the wipe this morning. Fecal Occult Blood Test was positive    PT Comments    Pt progressing towards physical therapy goals. Was able to perform transfers and ambulation with overall modified independence. Gait is not as smooth and polished as it could be, however pt is very functional and no complete losses of balance were noted this session. Will keep on acute PT caseload for continued strengthening until d/c.   Follow Up Recommendations  Home health PT;Supervision for mobility/OOB     Equipment Recommendations  None recommended by PT    Recommendations for Other Services       Precautions / Restrictions Precautions Precautions: Fall Precaution Comments: fall risk slight, greatly reduced with UE support Restrictions Weight Bearing Restrictions: No    Mobility  Bed Mobility               General bed mobility comments: Pt sitting up in recliner when PT arrived.   Transfers Overall transfer level: Modified independent Equipment used: None Transfers: Sit to/from Stand               Ambulation/Gait Ambulation/Gait assistance: Supervision Ambulation Distance (Feet): 700 Feet Assistive device: None Gait Pattern/deviations: WFL(Within Functional Limits) Gait velocity: Decreased Gait velocity interpretation: Below normal speed for age/gender General Gait Details: Occasional VC's for general safety awareness. Pt really at a mod I level and no physical assist was required.    Stairs Stairs:  (Pt declined stair training. Has one small step to enter. )          Wheelchair Mobility    Modified Rankin (Stroke Patients Only)       Balance Overall balance assessment: History of Falls                                  Cognition Arousal/Alertness: Awake/alert Behavior During Therapy: WFL for tasks assessed/performed Overall Cognitive Status: Within Functional Limits for tasks assessed                      Exercises      General Comments        Pertinent Vitals/Pain Pain Assessment: No/denies pain    Home Living                      Prior Function            PT Goals (current goals can now be found in the care plan section) Acute Rehab PT Goals Patient Stated Goal: to go home PT Goal Formulation: With patient Time For Goal Achievement: 01/26/15 Potential to Achieve Goals: Good Progress towards PT goals:  Progressing toward goals    Frequency  Min 3X/week    PT Plan Current plan remains appropriate    Co-evaluation             End of Session Equipment Utilized During Treatment: Gait belt Activity Tolerance: Patient tolerated treatment well Patient left: in chair;with call bell/phone within reach;with family/visitor present     Time: 8756-4332 PT Time Calculation (min) (ACUTE ONLY): 20 min  Charges:  $Gait Training: 8-22 mins                    G Codes:      Rolinda Roan 01/17/2015, 1:43 PM   Rolinda Roan, PT, DPT Acute Rehabilitation Services Pager: [phone]

## 2015-01-16 ENCOUNTER — Telehealth: Payer: Self-pay | Admitting: *Deleted

## 2015-01-16 NOTE — Telephone Encounter (Signed)
Transition Care Management Follow-up Telephone Call d/c 01/15/15  How have you been since you were released from the hospital? Called pt spoke with daughter Marissa [name]) she stated mom is doing ok   Do you understand why you were in the hospital? YES   Do you understand the discharge instrcutions? YES  Items Reviewed:  Medications reviewed: YES  Allergies reviewed: YES  Dietary changes reviewed: YES, low sodium  Referrals reviewed: No referral needed   Functional Questionnaire:   Activities of Daily Living (ADLs):   She states mom are independent in the following: ambulation, bathing and hygiene, feeding, continence, grooming, toileting and dressing States she require assistance with the following: ambulation, bathing and hygiene and dressing sometimes   Any transportation issues/concerns?: NO   Any patient concerns? NO   Confirmed importance and date/time of follow-up visits scheduled: YES, made appt 01/22/15 w/Dr. Linna Darner   Confirmed with patient if condition begins to worsen call PCP or go to the ER.

## 2015-01-21 ENCOUNTER — Telehealth: Payer: Self-pay | Admitting: Internal Medicine

## 2015-01-21 NOTE — Telephone Encounter (Signed)
Patient has been identified for case Management.  Have not been able to get in contact with.  Does suggest scheduling an OV if patient has not been in since hospital discharge on May 5th.

## 2015-01-21 NOTE — Telephone Encounter (Signed)
Noted, pt has appt for 01/22/15

## 2015-01-22 ENCOUNTER — Ambulatory Visit: Payer: Medicare Other | Admitting: Internal Medicine

## 2015-01-22 DIAGNOSIS — R4689 Other symptoms and signs involving appearance and behavior: Secondary | ICD-10-CM | POA: Insufficient documentation

## 2015-01-22 NOTE — Progress Notes (Signed)
   Subjective:    Patient ID: Marissa [name], female    DOB: 07/08/1941, 74 y.o.   MRN: [id number]  HPI    Review of Systems     Objective:   Physical Exam        Assessment & Plan:

## 2015-01-26 ENCOUNTER — Inpatient Hospital Stay: Payer: Medicare Other | Admitting: Internal Medicine

## 2015-03-05 ENCOUNTER — Other Ambulatory Visit (INDEPENDENT_AMBULATORY_CARE_PROVIDER_SITE_OTHER): Payer: Medicare Other

## 2015-03-05 ENCOUNTER — Encounter: Payer: Self-pay | Admitting: Internal Medicine

## 2015-03-05 ENCOUNTER — Ambulatory Visit (INDEPENDENT_AMBULATORY_CARE_PROVIDER_SITE_OTHER): Payer: Medicare Other | Admitting: Internal Medicine

## 2015-03-05 VITALS — BP 122/70 | HR 90 | Temp 97.6°F | Resp 16 | Ht 64.0 in | Wt 219.0 lb

## 2015-03-05 DIAGNOSIS — I10 Essential (primary) hypertension: Secondary | ICD-10-CM

## 2015-03-05 DIAGNOSIS — R1906 Epigastric swelling, mass or lump: Secondary | ICD-10-CM

## 2015-03-05 DIAGNOSIS — N183 Chronic kidney disease, stage 3 unspecified: Secondary | ICD-10-CM

## 2015-03-05 DIAGNOSIS — D509 Iron deficiency anemia, unspecified: Secondary | ICD-10-CM

## 2015-03-05 DIAGNOSIS — D62 Acute posthemorrhagic anemia: Secondary | ICD-10-CM

## 2015-03-05 LAB — COMPREHENSIVE METABOLIC PANEL
ALK PHOS: 121 U/L — AB (ref 39–117)
ALT: 13 U/L (ref 0–35)
AST: 17 U/L (ref 0–37)
Albumin: 3.8 g/dL (ref 3.5–5.2)
BILIRUBIN TOTAL: 0.5 mg/dL (ref 0.2–1.2)
BUN: 21 mg/dL (ref 6–23)
CO2: 22 meq/L (ref 19–32)
Calcium: 9.9 mg/dL (ref 8.4–10.5)
Chloride: 106 mEq/L (ref 96–112)
Creatinine, Ser: 1.55 mg/dL — ABNORMAL HIGH (ref 0.40–1.20)
GFR: 42 mL/min — AB (ref >60.00)
GLUCOSE: 88 mg/dL (ref 70–99)
Potassium: 4.1 mEq/L (ref 3.5–5.1)
Sodium: 137 mEq/L (ref 135–145)
Total Protein: 8.9 g/dL — ABNORMAL HIGH (ref 6.0–8.3)

## 2015-03-05 LAB — CBC WITH DIFFERENTIAL/PLATELET
Basophils Relative: 0.3 % (ref 0.0–3.0)
Eosinophils Relative: 1.6 % (ref 0.0–5.0)
HEMATOCRIT: 34 % — AB (ref 36.0–46.0)
Hemoglobin: 10.8 g/dL — ABNORMAL LOW (ref 12.0–15.0)
Lymphocytes Relative: 12.1 % (ref 12.0–46.0)
MCHC: 31.8 g/dL (ref 30.0–36.0)
MCV: 70.2 fl — AB (ref 78.0–100.0)
Monocytes Relative: 9.4 % (ref 3.0–12.0)
NEUTROS PCT: 76.6 % (ref 43.0–77.0)
PLATELETS: 382 10*3/uL (ref 150.0–400.0)
RBC: 4.84 Mil/uL (ref 3.87–5.11)
RDW: 14.5 % (ref 11.5–15.5)
WBC: 6.9 10*3/uL (ref 4.0–10.5)

## 2015-03-05 LAB — IBC PANEL
Iron: 17 ug/dL — ABNORMAL LOW (ref 42–145)
Saturation Ratios: 6.9 % — ABNORMAL LOW (ref 20.0–50.0)
TRANSFERRIN: 177 mg/dL — AB (ref 212.0–360.0)

## 2015-03-05 LAB — FERRITIN: FERRITIN: 314.4 ng/mL — AB (ref 10.0–291.0)

## 2015-03-05 MED ORDER — IRON 325 (65 FE) MG PO TABS: 325.0000 mg | ORAL_TABLET | Freq: Two times a day (BID) | ORAL | Status: DC

## 2015-03-05 NOTE — Progress Notes (Signed)
Subjective:  Patient ID: Marissa [name], female    DOB: [birth year]  Age: 74 y.o. MRN: [id number]  CC: Hypertension; Abdominal Pain; and Anemia   HPI Marissa [name] presents for the complaint of worsening soreness and mass over her upper abd over the last month with loss of appetite.  Outpatient Prescriptions Prior to Visit  Medication Sig Dispense Refill  . amLODipine (NORVASC) 10 MG tablet Take 1 tablet (10 mg total) by mouth daily. 30 tablet 11  . atorvastatin (LIPITOR) 40 MG tablet Take 1 tablet (40 mg total) by mouth daily. 30 tablet 0  . colchicine 0.6 MG tablet Take 1 tablet (0.6 mg total) by mouth 2 (two) times daily. 30 tablet 0  . cyanocobalamin 2000 MCG tablet Take 1 tablet (2,000 mcg total) by mouth daily. 90 tablet 3  . furosemide (LASIX) 20 MG tablet Take 20 mg by mouth daily as needed for fluid.     . metoprolol tartrate (LOPRESSOR) 25 MG tablet Take 1 tablet (25 mg total) by mouth every morning. 30 tablet 1  . nitroGLYCERIN (NITROSTAT) 0.4 MG SL tablet Place 1 tablet (0.4 mg total) under the tongue every 5 (five) minutes as needed for chest pain. 100 tablet 3  . pantoprazole (PROTONIX) 40 MG tablet Take 1 tablet (40 mg total) by mouth daily. 30 tablet 2  . VOLTAREN 1 % GEL     . erythromycin ophthalmic ointment     . Ferrous Sulfate (IRON) 325 (65 FE) MG TABS Take 325 mg by mouth 2 (two) times daily. 60 each 0   No facility-administered medications prior to visit.    ROS Review of Systems  Constitutional: Positive for appetite change. Negative for fever, chills, diaphoresis, activity change, fatigue and unexpected weight change.  HENT: Negative.  Negative for trouble swallowing and voice change.   Eyes: Negative.  Negative for visual disturbance.  Respiratory: Negative.  Negative for cough, choking, chest tightness, shortness of breath and stridor.   Cardiovascular: Negative.  Negative for chest pain, palpitations and leg swelling.  Gastrointestinal: Positive for  abdominal pain. Negative for nausea, vomiting, diarrhea, constipation, blood in stool, abdominal distention, anal bleeding and rectal pain.  Endocrine: Negative.   Genitourinary: Negative.  Negative for difficulty urinating.  Musculoskeletal: Negative.   Skin: Negative.  Negative for color change and pallor.  Allergic/Immunologic: Negative.   Neurological: Negative.  Negative for dizziness, tremors and weakness.  Hematological: Negative.  Negative for adenopathy. Does not bruise/bleed easily.  Psychiatric/Behavioral: Negative.     Objective:  BP 122/70 mmHg  Pulse 90  Temp(Src) 97.6 F (36.4 C) (Oral)  Resp 16  Ht 5\' 4"  (1.626 m)  Wt 219 lb (99.338 kg)  BMI 37.57 kg/m2  SpO2 98%  BP Readings from Last 3 Encounters:  03/05/15 122/70  01/15/15 158/77  01/07/15 112/68    Wt Readings from Last 3 Encounters:  03/05/15 219 lb (99.338 kg)  01/14/15 223 lb (101.152 kg)  01/07/15 216 lb (97.977 kg)    Physical Exam  Constitutional: She is oriented to person, place, and time. She appears well-developed and well-nourished. No distress.  HENT:  Head: Normocephalic and atraumatic.  Mouth/Throat: Oropharynx is clear and moist. No oropharyngeal exudate.  Eyes: Conjunctivae are normal. Right eye exhibits no discharge. Left eye exhibits no discharge. No scleral icterus.  Neck: Normal range of motion. Neck supple. No JVD present. No tracheal deviation present. No thyromegaly present.  Cardiovascular: Normal rate, regular rhythm, normal heart sounds and intact distal pulses.  Exam  reveals no gallop and no friction rub.   No murmur heard. Pulmonary/Chest: Effort normal and breath sounds normal. No stridor. No respiratory distress. She has no wheezes. She has no rales. She exhibits no tenderness.  Abdominal: Soft. Normal appearance and bowel sounds are normal. She exhibits mass. She exhibits no shifting dullness, no distension, no pulsatile liver, no fluid wave, no abdominal bruit, no ascites  and no pulsatile midline mass. There is hepatomegaly. There is no splenomegaly. There is tenderness in the epigastric area. There is no rigidity, no rebound, no guarding, no tenderness at McBurney's point and negative Murphy's sign. No hernia. Hernia confirmed negative in the ventral area, confirmed negative in the right inguinal area and confirmed negative in the left inguinal area.    Musculoskeletal: Normal range of motion. She exhibits no edema or tenderness.  Lymphadenopathy:    She has no cervical adenopathy.  Neurological: She is oriented to person, place, and time.  Skin: Skin is warm and dry. No rash noted. She is not diaphoretic. No erythema. No pallor.  Psychiatric: She has a normal mood and affect. Judgment and thought content normal. Her mood appears not anxious. Her speech is delayed and tangential. She is slowed and withdrawn. Cognition and memory are impaired. She does not exhibit a depressed mood. She exhibits abnormal recent memory and abnormal remote memory. She is inattentive.  Vitals reviewed.   Lab Results  Component Value Date   WBC 6.9 03/05/2015   HGB 10.8* 03/05/2015   HCT 34.0* 03/05/2015   PLT 382.0 03/05/2015   GLUCOSE 88 03/05/2015   CHOL 209* 05/29/2014   TRIG 114.0 05/29/2014   HDL 51.30 05/29/2014   LDLCALC 135* 05/29/2014   ALT 13 03/05/2015   AST 17 03/05/2015   NA 137 03/05/2015   K 4.1 03/05/2015   CL 106 03/05/2015   CREATININE 1.55* 03/05/2015   BUN 21 03/05/2015   CO2 22 03/05/2015   TSH 2.400 12/21/2014   INR 1.08 12/26/2014   HGBA1C 5.8 11/23/2011   MICROALBUR 0.50 04/03/2009    US Renal  01/10/2015   CLINICAL DATA:  Acute onset of renal insufficiency. Initial encounter.  EXAM: RENAL / URINARY TRACT ULTRASOUND COMPLETE  COMPARISON:  CT of the abdomen and pelvis performed 08/10/2012  FINDINGS: Right Kidney:  Length: 11.4 cm. Increased parenchymal echogenicity noted. A 1.6 x 1.5 x 1.4 cm cyst is noted near the upper pole of the right  kidney. No hydronephrosis visualized.  Left Kidney:  Length: 10.4 cm. Echogenicity within normal limits. No mass or hydronephrosis visualized.  Bladder:  Decompressed and not well assessed.  IMPRESSION: 1. No evidence of hydronephrosis. 2. Increased renal parenchymal echogenicity raises concern for medical renal disease. 3. Small right renal cyst again noted.   Electronically Signed   By: Garald Balding M.D.   On: 01/10/2015 06:29  IMPRESSION: Status post hysterectomy. Suspected small postoperative seroma in the surgical bed.  Postsurgical changes in the anterior abdominal wall without drainable fluid collection/abscess.  Multiple loops of small bowel are present immediately beneath the anterior abdominal wall incision. No extraluminal contrast is seen.  Assessment & Plan:   Caelen was seen today for hypertension, abdominal pain and anemia.  Diagnoses and all orders for this visit:  Abdominal wall mass of epigastric region - CT done about 3 years ago showed a seroma at this sight, will get another CT done to see if it has accumulated fluid, infection, or malignant transformation Orders: -     CT Abdomen  Pelvis W Contrast; Future -     Ambulatory referral to General Surgery  Acute blood loss anemia - this is stable Orders: -     CBC with Differential/Platelet; Future -     IBC panel; Future -     Ferritin; Future -     Ferrous Sulfate (IRON) 325 (65 FE) MG TABS; Take 325 mg by mouth 2 (two) times daily.  Chronic kidney disease, stage 3 Orders: -     Comprehensive metabolic panel; Future  Essential hypertension - her BP is well controlled, lytes and renal function are stable Orders: -     Comprehensive metabolic panel; Future  Anemia, iron deficiency - her iron level remains low, will restart iron replacement therapy  I have discontinued Ms. Savo's erythromycin. I am also having her maintain her nitroGLYCERIN, amLODipine, furosemide, atorvastatin, colchicine, metoprolol  tartrate, pantoprazole, cyanocobalamin, VOLTAREN, and Iron.  Meds ordered this encounter  Medications  . Ferrous Sulfate (IRON) 325 (65 FE) MG TABS    Sig: Take 325 mg by mouth 2 (two) times daily.    Dispense:  60 each    Refill:  11     Follow-up: Return in about 3 weeks (around 03/26/2015).  [name], MD

## 2015-03-05 NOTE — Patient Instructions (Signed)

## 2015-03-05 NOTE — Progress Notes (Signed)
Pre visit review using our clinic review tool, if applicable. No additional management support is needed unless otherwise documented below in the visit note. 

## 2015-03-13 ENCOUNTER — Encounter (HOSPITAL_COMMUNITY): Payer: Self-pay | Admitting: Emergency Medicine

## 2015-03-13 ENCOUNTER — Emergency Department (HOSPITAL_COMMUNITY): Payer: Medicare Other

## 2015-03-13 ENCOUNTER — Inpatient Hospital Stay (HOSPITAL_COMMUNITY)
Admission: EM | Admit: 2015-03-13 | Discharge: 2015-03-27 | DRG: 393 | Disposition: A | Discharge status: Home Health | Payer: Medicare Other | Attending: General Surgery | Admitting: General Surgery

## 2015-03-13 DIAGNOSIS — E785 Hyperlipidemia, unspecified: Secondary | ICD-10-CM | POA: Diagnosis present

## 2015-03-13 DIAGNOSIS — E860 Dehydration: Secondary | ICD-10-CM | POA: Diagnosis present

## 2015-03-13 DIAGNOSIS — Z90710 Acquired absence of both cervix and uterus: Secondary | ICD-10-CM

## 2015-03-13 DIAGNOSIS — I5032 Chronic diastolic (congestive) heart failure: Secondary | ICD-10-CM | POA: Diagnosis present

## 2015-03-13 DIAGNOSIS — E669 Obesity, unspecified: Secondary | ICD-10-CM | POA: Diagnosis present

## 2015-03-13 DIAGNOSIS — D509 Iron deficiency anemia, unspecified: Secondary | ICD-10-CM | POA: Diagnosis present

## 2015-03-13 DIAGNOSIS — I2510 Atherosclerotic heart disease of native coronary artery without angina pectoris: Secondary | ICD-10-CM | POA: Diagnosis present

## 2015-03-13 DIAGNOSIS — I129 Hypertensive chronic kidney disease with stage 1 through stage 4 chronic kidney disease, or unspecified chronic kidney disease: Secondary | ICD-10-CM | POA: Diagnosis present

## 2015-03-13 DIAGNOSIS — M109 Gout, unspecified: Secondary | ICD-10-CM | POA: Diagnosis present

## 2015-03-13 DIAGNOSIS — IMO0002 Reserved for concepts with insufficient information to code with codable children: Secondary | ICD-10-CM

## 2015-03-13 DIAGNOSIS — Z833 Family history of diabetes mellitus: Secondary | ICD-10-CM | POA: Diagnosis not present

## 2015-03-13 DIAGNOSIS — A419 Sepsis, unspecified organism: Secondary | ICD-10-CM | POA: Diagnosis present

## 2015-03-13 DIAGNOSIS — K219 Gastro-esophageal reflux disease without esophagitis: Secondary | ICD-10-CM | POA: Diagnosis present

## 2015-03-13 DIAGNOSIS — I959 Hypotension, unspecified: Secondary | ICD-10-CM | POA: Diagnosis not present

## 2015-03-13 DIAGNOSIS — N179 Acute kidney failure, unspecified: Secondary | ICD-10-CM | POA: Diagnosis present

## 2015-03-13 DIAGNOSIS — K5731 Diverticulosis of large intestine without perforation or abscess with bleeding: Secondary | ICD-10-CM | POA: Diagnosis present

## 2015-03-13 DIAGNOSIS — I10 Essential (primary) hypertension: Secondary | ICD-10-CM | POA: Diagnosis not present

## 2015-03-13 DIAGNOSIS — Z79899 Other long term (current) drug therapy: Secondary | ICD-10-CM | POA: Diagnosis not present

## 2015-03-13 DIAGNOSIS — M130 Polyarthritis, unspecified: Secondary | ICD-10-CM | POA: Diagnosis present

## 2015-03-13 DIAGNOSIS — Z6838 Body mass index (BMI) 38.0-38.9, adult: Secondary | ICD-10-CM

## 2015-03-13 DIAGNOSIS — M793 Panniculitis, unspecified: Secondary | ICD-10-CM | POA: Diagnosis present

## 2015-03-13 DIAGNOSIS — Z8249 Family history of ischemic heart disease and other diseases of the circulatory system: Secondary | ICD-10-CM

## 2015-03-13 DIAGNOSIS — E46 Unspecified protein-calorie malnutrition: Secondary | ICD-10-CM | POA: Diagnosis present

## 2015-03-13 DIAGNOSIS — M10062 Idiopathic gout, left knee: Secondary | ICD-10-CM | POA: Diagnosis not present

## 2015-03-13 DIAGNOSIS — K632 Fistula of intestine: Principal | ICD-10-CM | POA: Diagnosis present

## 2015-03-13 DIAGNOSIS — N183 Chronic kidney disease, stage 3 unspecified: Secondary | ICD-10-CM | POA: Diagnosis present

## 2015-03-13 DIAGNOSIS — L02211 Cutaneous abscess of abdominal wall: Secondary | ICD-10-CM | POA: Diagnosis present

## 2015-03-13 DIAGNOSIS — R0789 Other chest pain: Secondary | ICD-10-CM | POA: Diagnosis not present

## 2015-03-13 DIAGNOSIS — E1142 Type 2 diabetes mellitus with diabetic polyneuropathy: Secondary | ICD-10-CM | POA: Diagnosis present

## 2015-03-13 DIAGNOSIS — Z955 Presence of coronary angioplasty implant and graft: Secondary | ICD-10-CM | POA: Diagnosis not present

## 2015-03-13 DIAGNOSIS — D638 Anemia in other chronic diseases classified elsewhere: Secondary | ICD-10-CM | POA: Diagnosis present

## 2015-03-13 DIAGNOSIS — Z86010 Personal history of colonic polyps: Secondary | ICD-10-CM | POA: Diagnosis not present

## 2015-03-13 DIAGNOSIS — E861 Hypovolemia: Secondary | ICD-10-CM | POA: Diagnosis present

## 2015-03-13 DIAGNOSIS — Z9861 Coronary angioplasty status: Secondary | ICD-10-CM

## 2015-03-13 DIAGNOSIS — K2960 Other gastritis without bleeding: Secondary | ICD-10-CM | POA: Diagnosis present

## 2015-03-13 DIAGNOSIS — G4733 Obstructive sleep apnea (adult) (pediatric): Secondary | ICD-10-CM | POA: Diagnosis present

## 2015-03-13 DIAGNOSIS — R1084 Generalized abdominal pain: Secondary | ICD-10-CM

## 2015-03-13 DIAGNOSIS — Z86718 Personal history of other venous thrombosis and embolism: Secondary | ICD-10-CM | POA: Diagnosis not present

## 2015-03-13 LAB — I-STAT CG4 LACTIC ACID, ED: Lactic Acid, Venous: 2.2 mmol/L (ref 0.5–2.0)

## 2015-03-13 LAB — COMPREHENSIVE METABOLIC PANEL
ALT: 25 U/L (ref 14–54)
AST: 35 U/L (ref 15–41)
Albumin: 3.3 g/dL — ABNORMAL LOW (ref 3.5–5.0)
Alkaline Phosphatase: 134 U/L — ABNORMAL HIGH (ref 38–126)
Anion gap: 13 (ref 5–15)
BUN: 36 mg/dL — ABNORMAL HIGH (ref 6–20)
CO2: 18 mmol/L — ABNORMAL LOW (ref 22–32)
Calcium: 9.6 mg/dL (ref 8.9–10.3)
Chloride: 103 mmol/L (ref 101–111)
Creatinine, Ser: 1.93 mg/dL — ABNORMAL HIGH (ref 0.44–1.00)
GFR calc Af Amer: 28 mL/min — ABNORMAL LOW (ref >60)
GFR calc non Af Amer: 24 mL/min — ABNORMAL LOW (ref >60)
Glucose, Bld: 103 mg/dL — ABNORMAL HIGH (ref 65–99)
Potassium: 5 mmol/L (ref 3.5–5.1)
Sodium: 134 mmol/L — ABNORMAL LOW (ref 135–145)
Total Bilirubin: 1.1 mg/dL (ref 0.3–1.2)
Total Protein: 9.5 g/dL — ABNORMAL HIGH (ref 6.5–8.1)

## 2015-03-13 LAB — URINALYSIS, ROUTINE W REFLEX MICROSCOPIC
Glucose, UA: NEGATIVE mg/dL
Ketones, ur: NEGATIVE mg/dL
Nitrite: NEGATIVE
Protein, ur: 30 mg/dL — AB
Specific Gravity, Urine: 1.02 (ref 1.005–1.030)
Urobilinogen, UA: 1 mg/dL (ref 0.0–1.0)
pH: 5.5 (ref 5.0–8.0)

## 2015-03-13 LAB — URINE MICROSCOPIC-ADD ON

## 2015-03-13 LAB — CBC WITH DIFFERENTIAL/PLATELET
Basophils Absolute: 0 10*3/uL (ref 0.0–0.1)
Basophils Relative: 0 % (ref 0–1)
Eosinophils Absolute: 0 10*3/uL (ref 0.0–0.7)
Eosinophils Relative: 0 % (ref 0–5)
HCT: 36 % (ref 36.0–46.0)
Hemoglobin: 11.3 g/dL — ABNORMAL LOW (ref 12.0–15.0)
Lymphocytes Relative: 7 % — ABNORMAL LOW (ref 12–46)
Lymphs Abs: 1.1 10*3/uL (ref 0.7–4.0)
MCH: 21.7 pg — ABNORMAL LOW (ref 26.0–34.0)
MCHC: 31.4 g/dL (ref 30.0–36.0)
MCV: 69.1 fL — ABNORMAL LOW (ref 78.0–100.0)
Monocytes Absolute: 1.2 10*3/uL — ABNORMAL HIGH (ref 0.1–1.0)
Monocytes Relative: 8 % (ref 3–12)
Neutro Abs: 13.2 10*3/uL — ABNORMAL HIGH (ref 1.7–7.7)
Neutrophils Relative %: 85 % — ABNORMAL HIGH (ref 43–77)
Platelets: 366 10*3/uL (ref 150–400)
RBC: 5.21 MIL/uL — ABNORMAL HIGH (ref 3.87–5.11)
RDW: 14.4 % (ref 11.5–15.5)
WBC: 15.5 10*3/uL — ABNORMAL HIGH (ref 4.0–10.5)

## 2015-03-13 LAB — GLUCOSE, CAPILLARY: Glucose-Capillary: 104 mg/dL — ABNORMAL HIGH (ref 65–99)

## 2015-03-13 MED ORDER — HYDROMORPHONE HCL 1 MG/ML IJ SOLN
0.5000 mg | Freq: Once | INTRAMUSCULAR | Status: AC
  Administered 2015-03-13: 0.5 mg via INTRAVENOUS
  Filled 2015-03-13: qty 1

## 2015-03-13 MED ORDER — SODIUM CHLORIDE 0.9 % IV BOLUS (SEPSIS)
1000.0000 mL | Freq: Once | INTRAVENOUS | Status: AC
  Administered 2015-03-13: 1000 mL via INTRAVENOUS

## 2015-03-13 MED ORDER — KCL IN DEXTROSE-NACL 20-5-0.45 MEQ/L-%-% IV SOLN
INTRAVENOUS | Status: AC
  Administered 2015-03-13 – 2015-03-15 (×4): via INTRAVENOUS
  Filled 2015-03-13 (×5): qty 1000

## 2015-03-13 MED ORDER — METOPROLOL TARTRATE 1 MG/ML IV SOLN
5.0000 mg | Freq: Four times a day (QID) | INTRAVENOUS | Status: DC
  Administered 2015-03-13: 5 mg via INTRAVENOUS
  Filled 2015-03-13 (×7): qty 5

## 2015-03-13 MED ORDER — IOHEXOL 300 MG/ML  SOLN
50.0000 mL | Freq: Once | INTRAMUSCULAR | Status: AC | PRN
  Administered 2015-03-13: 50 mL via ORAL

## 2015-03-13 MED ORDER — HYDRALAZINE HCL 20 MG/ML IJ SOLN
10.0000 mg | Freq: Four times a day (QID) | INTRAMUSCULAR | Status: DC | PRN
  Administered 2015-03-17 – 2015-03-19 (×3): 10 mg via INTRAVENOUS
  Filled 2015-03-13 (×3): qty 1

## 2015-03-13 MED ORDER — INSULIN ASPART 100 UNIT/ML ~~LOC~~ SOLN
0.0000 [IU] | Freq: Four times a day (QID) | SUBCUTANEOUS | Status: DC
  Administered 2015-03-16: 2 [IU] via SUBCUTANEOUS
  Administered 2015-03-16 – 2015-03-17 (×2): 1 [IU] via SUBCUTANEOUS
  Administered 2015-03-18: 2 [IU] via SUBCUTANEOUS
  Administered 2015-03-19: 1 [IU] via SUBCUTANEOUS

## 2015-03-13 MED ORDER — HEPARIN SODIUM (PORCINE) 5000 UNIT/ML IJ SOLN
5000.0000 [IU] | Freq: Three times a day (TID) | INTRAMUSCULAR | Status: DC
  Administered 2015-03-13 – 2015-03-14 (×2): 5000 [IU] via SUBCUTANEOUS
  Filled 2015-03-13 (×5): qty 1

## 2015-03-13 MED ORDER — ONDANSETRON HCL 4 MG/2ML IJ SOLN: 4.0000 mg | Freq: Four times a day (QID) | INTRAMUSCULAR | Status: DC | PRN

## 2015-03-13 MED ORDER — ACETAMINOPHEN 650 MG RE SUPP: 650.0000 mg | Freq: Four times a day (QID) | RECTAL | Status: DC | PRN

## 2015-03-13 MED ORDER — PANTOPRAZOLE SODIUM 40 MG IV SOLR
40.0000 mg | Freq: Every day | INTRAVENOUS | Status: DC
  Administered 2015-03-13 – 2015-03-26 (×14): 40 mg via INTRAVENOUS
  Filled 2015-03-13 (×15): qty 40

## 2015-03-13 MED ORDER — MORPHINE SULFATE 2 MG/ML IJ SOLN
1.0000 mg | INTRAMUSCULAR | Status: DC | PRN
  Administered 2015-03-13: 2 mg via INTRAVENOUS
  Administered 2015-03-13: 1 mg via INTRAVENOUS
  Administered 2015-03-14 – 2015-03-25 (×17): 2 mg via INTRAVENOUS
  Administered 2015-03-25: 1 mg via INTRAVENOUS
  Filled 2015-03-13 (×20): qty 1

## 2015-03-13 MED ORDER — DIPHENHYDRAMINE HCL 50 MG/ML IJ SOLN: 12.5000 mg | Freq: Four times a day (QID) | INTRAMUSCULAR | Status: DC | PRN

## 2015-03-13 MED ORDER — DIPHENHYDRAMINE HCL 12.5 MG/5ML PO ELIX: 12.5000 mg | ORAL_SOLUTION | Freq: Four times a day (QID) | ORAL | Status: DC | PRN

## 2015-03-13 MED ORDER — NITROGLYCERIN 0.4 MG SL SUBL: 0.4000 mg | SUBLINGUAL_TABLET | SUBLINGUAL | Status: DC | PRN

## 2015-03-13 MED ORDER — SODIUM CHLORIDE 0.9 % IV SOLN
1.0000 g | INTRAVENOUS | Status: DC
  Administered 2015-03-13 – 2015-03-25 (×13): 1 g via INTRAVENOUS
  Filled 2015-03-13 (×13): qty 1

## 2015-03-13 MED ORDER — MORPHINE SULFATE 4 MG/ML IJ SOLN
4.0000 mg | Freq: Once | INTRAMUSCULAR | Status: AC
  Administered 2015-03-13: 4 mg via INTRAVENOUS
  Filled 2015-03-13: qty 1

## 2015-03-13 MED ORDER — ACETAMINOPHEN 325 MG PO TABS
650.0000 mg | ORAL_TABLET | Freq: Four times a day (QID) | ORAL | Status: DC | PRN
  Administered 2015-03-18: 650 mg via ORAL
  Filled 2015-03-13 (×2): qty 2

## 2015-03-13 NOTE — ED Notes (Signed)
PA made aware of lactic acid

## 2015-03-13 NOTE — Progress Notes (Signed)
Patient ID: Marissa [name], female   DOB: [birth year], 74 y.o.   MRN: [id number] Page my nurse earlier this evening that upon transfer from patient from the emergency department to the floor she began to have spontaneous drainage from the upper midline abscess. Instructed nurse to just reinforce with dressing until I could assess. On arrival the patient is not in distress. Her vital signs are stable. She is nontoxic appearing.  Obese abdomen, long midline incision. In the upper midline there is cellulitis extending to the left & to the right. This area is tender. In the actual scar in the upper midline in the center there is just a thin fell of skin intact. It is thinner than tissue paper. When pressed on this area there is reddish fluid that will drain out. It does have a light brown tinge to it. It appears that the small bowel fistula has spontaneously drained now forming an entertoatmospheric fistula. After the patient received some IV pain medication, I sharply excised this thin tissue (unroofed it). I only excised about 1 cm.  After reviewing the CT scan there is definite oral contrast and air in the subcutaneous tissue consistent with a fistula. However there is no significant gap in the fascia as best I can tell. I will ask the nurse to place an ostomy pouch over this defect in the skin to help with drainage. We will continue IV anabiotic's. She still may need surgery to clean up the subcutaneous tissue if her cellulitis does not improve. We may need to repeat imaging to get better idea now that she has had drainage whether or not there is truly an incisional hernia there.  We'll place a nasogastric tube and continue bowel rest.  I discussed the results of the CT scan with the patient prior to starting this bedside procedure and we discussed the typical course with an enteroatmospheric fistula  Leighton Ruff. Redmond Pulling, MD, FACS General, Bariatric, & Minimally Invasive Surgery [facility], [place]

## 2015-03-13 NOTE — ED Notes (Signed)
Pt to floor at 17:35

## 2015-03-13 NOTE — ED Notes (Signed)
Patient transported to CT 

## 2015-03-13 NOTE — Consult Note (Signed)
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 -  General surgery managing -  Currently nothing by mouth and awaiting laparoscopic bowel resection probably tomorrow -  Ertapenem   CAD, HTN, and HLD, BP is elevated, having atypical chest pains -  ECG pending -  Holding all oral medication -  Agree with continuing BB, particularly perioperatively -  Agree with prn hydralazine -  Could consider adding clonidine patch if her blood pressure remains difficult to control  Chronic diastolic heart failure, last ECHO 07/2014 with preserved EF and mild concentric LVH -  Appears euvolemic -  Agree with holding lasix and anticipate that she will likely have significant third-spacing post-procedure -  May need temporary respiratory support post-surgery depending on progression -  IS and continued CPAP   Acute kidney injury likely due to dehydration and sepsis.  Baseline creatinine is 1.4. Peak creatinine 1.9.   -  UA:  Concentrated, granular casts -  Add on urine culture -  Renally dose medications -  Minimize nephrotoxins and avoid ACEI/ARB -  IVF and trend creatinine  Diet-controlled diabetes mellitus -  q6h CBG with low dose SSI, but if A1c < 6.5 or if CBG persistently < 140, okay to d/c  -  Check A1c   Recurrent diverticular hemorrhage and antral gastritis with three admissions in the last three months for bleeding, last admission required two clips to stop gastric bleeding on 01/13/2015  Gout, stable, hold colchicine.  May use low dose IV solumedrol sparingly should she have a flare  Hx of DVT, not  on anticoagulation -  Please maintain DVT prophylaxis  OSA, stable, continue cpap  Leukocytosis, likely related to underlying infection -  abx as above -  Repeat WBC in AM  Microcytic anemia, likely due to iron deficiency and inflammation -  Consider IV iron during hospitalization if she remains NPO for extended period of time -  Trend hemoglobin -  Minimize labs where able  I will followup again tomorrow. Please contact me if I can be of assistance in the meanwhile. Thank you for this consultation.  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  She has had abdominal hysterectomy and salpingo-oophorectomy, recurrent surgery for wound dehiscence, partial colectomy secondary to previously perforated bowel, umbilical hernia repair. She states that over the last week she has had progressive weakness and fatigue and about 1 week ago she noted a knot in her mid abdomen that was somewhat painful with pressure there was initially mild and progressively worsened. She was seen by her primary care doctor who was concerned about a possible herniated bowel and he referred her to general surgery, however her appointment is not until 2 weeks from today. She developed nausea, mild amount of diarrhea. About 4 days ago she noticed some mild erythema around her mid abdomen in  the upper part of her scar which became increasingly tender and swollen and developed a purulent central area. She has not had any discharge from this area. She came to the emergency  department today because of increased pain. She denies fevers, chills.  She has had some intermittent substernal chest pain since she had her clip placed, however she denies exertional chest pain, shortness of breath. She denies increased lower extremity swelling and orthopnea.  In the emergency department, her vital signs were notable for mildly elevated blood pressure. Her white blood cell count was 15.5, hemoglobin 11.3, creatinine 1.93 with a baseline of 1.4. CT scan of abdomen and pelvis demonstrated a large subcutaneous abscess 9.1 x 6.8 x 10 cm with a fistula that extends to the abdominal wall. The fistula appears to originate at the small bowel. Her colonic anastomosis appears to be intact and she has colonic diverticulosis without diverticulitis. General surgery was called for admission and has asked that we follow given her multiple comorbidities and the probability that she will be nothing by mouth for an extended period of time.  Review of Systems:   General:  Denies fevers, chills, weight loss or gain HEENT:  Denies changes to hearing and vision, rhinorrhea, sinus congestion, sore throat CV:  Denies palpitations, chronic  lower extremity edema.  PULM:  Denies SOB, wheezing, cough.   GI:  per history of present illness GU:  Denies dysuria, frequency, urgency ENDO:  Denies polyuria, polydipsia.   HEME:  Denies hematemesis, blood in stools, melena, abnormal bruising or bleeding.  LYMPH:  Denies lymphadenopathy.   MSK:  Denies arthralgias, myalgias.   DERM:  Denies skin rash or ulcer.   NEURO:  Denies focal numbness, weakness, slurred speech, confusion, facial droop.  PSYCH:  Denies anxiety and depression.    Past Medical History  Diagnosis Date  . Hypertension   . Renal insufficiency     baseline Cr ~ 1.3  . Hyperlipidemia   . History of DVT (deep vein thrombosis)   . History of colonic polyps   . Gout   . Polyarthritis   . Microcytic anemia   . OSA (obstructive sleep apnea)    . GERD (gastroesophageal reflux disease)   . Esophagitis   . Hidradenitis suppurativa     s/p axillary sweat gland removal  . H/O blood transfusion reaction     at [facility] hospital  . Headache(784.0)   . Diverticulitis   . Difficulty sleeping   . PMB (postmenopausal bleeding)     X 2 YRS  . Bowel trouble     OCCASIONAL BOWEL INCONTINENCE  . Diabetes mellitus without complication     diet controlled  . Fibroids     s/p TAH/BSO 07/2012  . CAD (coronary artery disease)     a. 8/07 had BMS to OM.  b. In-stent restenosis with later Promus DES to same site.  c. Lexiscan myoview in 1/13 showed EF 67%, no ischemia or infarction.  d. Echo (2/13) showed EF 55-60%, moderate LVH, mild MR.  e. Lex MV 6/14: no isch, EF 53%;  f. Echo 6/14: mild LVH, EF 55-60%, Gr 1 DD, MAC, mild MR, mild LAE  . Peripheral neuropathy   . Chronic diastolic CHF (congestive heart failure) 06/29/2014   Past Surgical History  Procedure Laterality Date  . Cervical fusion c-4-5 other    . Cervical laminectomy other    . Tonsillectomy other    . Umbilical hernia repair    . Axillary seat gland removal  other    . Right knee arthroscopy other    . Colectomy  2004    left partial for perforation  . Cholecystectomy    . Tubal ligation    . Ptca and stenting      of mid and distal circumflex coronary artery. 1st stent was 04/2006, with drug eluting stent placed on 12/20/07 (30-50% RCA stenosis). Cardiologist Dr. Einar Gip.  . Dilation and curettage of uterus  02/2011  . Hysteroscopy w/d&c  05/04/2012    Procedure: DILATATION AND CURETTAGE /HYSTEROSCOPY;  Surgeon: Lahoma Crocker, MD;  Location: [facility];  Service: Gynecology;  Laterality: N/A;  . Robotic assisted total hysterectomy with bilateral salpingo oopherectomy  07/24/2012    Procedure: ROBOTIC ASSISTED TOTAL HYSTERECTOMY WITH BILATERAL SALPINGO OOPHORECTOMY;  Surgeon: Janie Morning, MD PHD;  Location: [facility];  Service: Gynecology;  Laterality: Bilateral;  attempted ,   converted to abdomnial hysterectomy  . Abdominal hysterectomy  07/24/2012    Procedure: HYSTERECTOMY ABDOMINAL;  Surgeon: Janie Morning, MD PHD;  Location: [facility];  Service: Gynecology;;  . Salpingoophorectomy  07/24/2012    Procedure: SALPINGO OOPHORECTOMY;  Surgeon: Janie Morning, MD PHD;  Location: [facility];  Service: Gynecology;  Laterality: Bilateral;  . Abdominal wound dehiscence  08/01/2012    Procedure: ABDOMINAL WOUND DEHISCENCE;  Surgeon: Lahoma Crocker, MD;  Location: [facility];  Service: Gynecology;  Laterality: N/A;  . Laparotomy  08/02/2012    Procedure: EXPLORATORY LAPAROTOMY;  Surgeon: Imogene Burn. Georgette Dover, MD;  Location: [facility];  Service: General;  Laterality: N/A;  placement of abdominal wound vac  . Colonoscopy N/A 12/22/2014    Procedure: COLONOSCOPY;  Surgeon: Juanita Craver, MD;  Location: [facility];  Service: Endoscopy;  Laterality: N/A;  . Esophagogastroduodenoscopy Left 01/13/2015    Procedure: ESOPHAGOGASTRODUODENOSCOPY (EGD);  Surgeon: Carol Ada, MD;  Location: [facility] ENDOSCOPY;  Service: Endoscopy;  Laterality: Left;   Social History:  reports that she has never smoked. She has never used smokeless tobacco. She reports that she does not drink alcohol or use illicit drugs.  No Known Allergies Family History  Problem Relation Age of Onset  . Diabetes Mother   . Coronary artery disease Mother   . Diabetes Sister   . Coronary artery disease Sister     Prior to Admission medications   Medication Sig Start Date End Date Taking? Authorizing Provider  colchicine 0.6 MG tablet Take 1 tablet (0.6 mg total) by mouth 2 (two) times daily. 12/25/14  Yes Reyne Dumas, MD  cyanocobalamin 2000 MCG tablet Take 1 tablet (2,000 mcg total) by mouth daily. 01/07/15  Yes Janith Lima, MD  furosemide (LASIX) 20 MG tablet Take 20 mg by mouth daily as needed for fluid.  11/03/14  Yes Historical Provider, MD  metoprolol tartrate (LOPRESSOR) 25 MG tablet Take 1 tablet (25 mg total) by mouth  every morning. 12/25/14  Yes Reyne Dumas, MD  nitroGLYCERIN (NITROSTAT) 0.4 MG SL tablet Place 1 tablet (0.4 mg total) under the tongue every 5 (five) minutes as needed for chest pain. 02/26/13  Yes Larey Dresser, MD  pantoprazole (PROTONIX) 40 MG tablet Take 1 tablet (40 mg total) by mouth daily. 12/25/14  Yes Reyne Dumas, MD  VOLTAREN 1 % GEL Apply 2 g topically 4 (four) times daily as needed (pain).  12/25/14  Yes Historical Provider, MD  amLODipine (NORVASC) 10 MG tablet Take 1 tablet (10 mg total) by mouth daily. 05/29/14   Janith Lima, MD  atorvastatin (LIPITOR) 40 MG tablet Take 1  tablet (40 mg total) by mouth daily. Patient not taking: Reported on 03/13/2015 12/25/14   Reyne Dumas, MD  Ferrous Sulfate (IRON) 325 (65 FE) MG TABS Take 325 mg by mouth 2 (two) times daily. Patient not taking: Reported on 03/13/2015 03/05/15   Janith Lima, MD   Physical Exam: Blood pressure 155/78, pulse 90, temperature 97.5 F (36.4 C), temperature source Oral, resp. rate 18, SpO2 99 %. Filed Vitals:   03/13/15 1139 03/13/15 1447  BP: 154/78 155/78  Pulse: 96 90  Temp: 97.5 F (36.4 C)   TempSrc: Oral   Resp: 18 18  SpO2: 100% 99%     General:  Adult female, no acute distress   Eyes:  PERRL, anicteric, non-injected.  ENT:  Nares clear.  OP clear, non-erythematous without plaques or exudates.  MMM.  Neck:  Supple without TM or JVD.    Lymph:  No cervical, supraclavicular, or submandibular LAD.  Cardiovascular:  RRR, normal S1, S2, 1/6 systolic murmur at the left sternal border.  2+ pulses, warm extremities  Respiratory: Diminished at the bilateral bases, clear at the mid and upper back,without increased WOB.  Abdomen:  NABS.  Soft, Mildly distended, a 20-30 cm area in the mid abdomen centralized at the upper end of a craniocaudal scar with a mild amount of induration and a 3 cm area of purulence visualized through skin with some gas bubbles. No drainage. Very tender to palpation with some  guarding and rebound.  Skin:  No rashes or focal lesions other than mentioned above   Musculoskeletal:  Normal bulk and tone.  No LE edema.  Psychiatric:  A & O x 4.  Appropriate affect.  Neurologic:  CN 3-12 intact.  5/5 strength.  Sensation intact.  Labs on Admission:  Basic Metabolic Panel:  Recent Labs Lab 03/13/15 1246  NA 134*  K 5.0  CL 103  CO2 18*  GLUCOSE 103*  BUN 36*  CREATININE 1.93*  CALCIUM 9.6   Liver Function Tests:  Recent Labs Lab 03/13/15 1246  AST 35  ALT 25  ALKPHOS 134*  BILITOT 1.1  PROT 9.5*  ALBUMIN 3.3*   No results for input(s): LIPASE, AMYLASE in the last 168 hours. No results for input(s): AMMONIA in the last 168 hours. CBC:  Recent Labs Lab 03/13/15 1246  WBC 15.5*  NEUTROABS 13.2*  HGB 11.3*  HCT 36.0  MCV 69.1*  PLT 366   Cardiac Enzymes: No results for input(s): CKTOTAL, CKMB, CKMBINDEX, TROPONINI in the last 168 hours. BNP: Invalid input(s): POCBNP CBG: No results for input(s): GLUCAP in the last 168 hours.  Radiological Exams on Admission: Ct Abdomen Pelvis Wo Contrast  03/13/2015   CLINICAL DATA:  Abdominal pain. Decreased appetite. History of abdominal hernia.  EXAM: CT ABDOMEN AND PELVIS WITHOUT CONTRAST  TECHNIQUE: Multidetector CT imaging of the abdomen and pelvis was performed following the standard protocol without IV contrast.  COMPARISON:  CT abdomen pelvis 08/10/2012.  FINDINGS: The heart size is normal. Coronary artery calcifications are present. There is no significant pleural pericardial effusion. The lung bases are clear.  Liver and spleen are within normal limits. The stomach is mildly distended a small hiatal hernia is noted, similar to the prior scan. The duodenum and pancreas are within normal limits. The common bile duct is normal following cholecystectomy. And upper pole right renal cyst is stable. No other focal renal lesions are present. The ureters are normal. Urinary bladder is unremarkable.   Sigmoid diverticula are present without  focal inflammation to suggest diverticulitis. An anastomosis in the distal transverse segment is intact. The more proximal colon is unremarkable. There is contrast in the colon to the level of the transverse colon. The terminal ileum is within normal limits.  A ventral hernia repair is evident. There small bowel adhesions anteriorly. A fistula is noted on image 47 of series 2. There is contrast and air code extending across the peritoneal new. A complex subcutaneous collection contains gas and contrast. The collection measures 9.1 x 6.8 x 10.0 cm. This extends to just below the skin surface. The majority of the collection is just above the umbilicus.  The bone windows demonstrate a vacuum disc at L5-S1. There is fusion of anterior osteophytes the lower thoracic spine. No focal lytic or blastic lesions are present. The pelvis is unremarkable.  IMPRESSION: 1. 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 2. Large subcutaneous collection from the fistula of with probable abscess formation measuring 9.1 x 6.8 x 10.0 cm. But there is fluid and air with contrast extending from the bowel and to the collection. 3. Intact colonic anastomosis. 4. Colonic diverticulosis without diverticulitis. 5. Cholecystectomy, hysterectomy, and oophorectomy. 6. Degenerative changes of the lumbar spine at L5-S1. 7. Atherosclerosis including coronary artery disease. These results were called by telephone at the time of interpretation on 03/13/2015 at 3:14 pm to Dr. Dalia Heading , who verbally acknowledged these results.   Electronically Signed   By: San Morelle M.D.   On: 03/13/2015 15:15    EKG: pending  Time spent: 75 min  Marcel Sorter [facility] Pager [phone]  If 7PM-7AM, please contact night-coverage [email or web address] Password [facility] 03/13/2015, 4:49 PM

## 2015-03-13 NOTE — ED Notes (Signed)
Nurse starting IV 

## 2015-03-13 NOTE — Progress Notes (Signed)
Patient denies the use of nocturnal CPAP use consistently at home, but does say she uses it "sometimes". RN has informed that NGT is to be placed. Patient will not be using CPAP tonight due to this. Equipment is at the bedside for future use.

## 2015-03-13 NOTE — H&P (Signed)
Marissa [name] 08-17-1941  [id number].   Primary Care MD: Dr. Ronnald Ramp Chief Complaint/Reason for Consult: EC fistula with abscess HPI: This is a 74 yo black female with a complex past surgical history and several medical problems as listed below.  She has had several laparotomies in the past, one for a colon resection with anastomosis.  She underwent a robotic assisted hysterectomy in 2013.  4 days later she returned with foul smelling drainage from her wound c/w fistulous drainage.  She was taken to the OR, but the fistula has likely closed.  No bowel resection was done.  Her abdomen was left open with a VAC in place.  This wound healed by secondary intention.  Recently she has been admitted several times for GI bleeding.  She had a cscope that revealed a bleeding diverticula and in May she had an endoscopy that revealed erosive gastritis with some bleeding.  This was clipped.    Since then for the last month she has had some vague abdominal pain with some intermittent distention.  Within the last week it has worsened.  She saw her PCP last week who referred her to our office, but her appt is not until 03-27-15.  She has had some nausea and anorexia.  Some diarrhea as well.  3 days ago she noticed some swelling over her scar and redness.  Due to pain she presented to the [facility] today, where she had a CT scan that reveals an EC fistula with a large subcutaneous abscess secondary to this fistula.  No fevers.  We have been asked to see her for admission.  ROS : Please see HPI, otherwise negative, except some issues with gout recently.  Family History  Problem Relation Age of Onset  . Diabetes Mother   . Coronary artery disease Mother   . Diabetes Sister   . Coronary artery disease Sister     Past Medical History  Diagnosis Date  . Hypertension   . Renal insufficiency     baseline Cr ~ 1.3  . Hyperlipidemia   . History of DVT (deep vein thrombosis)   . History of colonic polyps   . Gout   .  Polyarthritis   . Microcytic anemia   . OSA (obstructive sleep apnea)   . GERD (gastroesophageal reflux disease)   . Esophagitis   . Hidradenitis suppurativa     s/p axillary sweat gland removal  . H/O blood transfusion reaction     at [facility] hospital  . Headache(784.0)   . Diverticulitis   . Difficulty sleeping   . PMB (postmenopausal bleeding)     X 2 YRS  . Bowel trouble     OCCASIONAL BOWEL INCONTINENCE  . Diabetes mellitus without complication     diet controlled  . Fibroids     s/p TAH/BSO 07/2012  . CAD (coronary artery disease)     a. 8/07 had BMS to OM.  b. In-stent restenosis with later Promus DES to same site.  c. Lexiscan myoview in 1/13 showed EF 67%, no ischemia or infarction.  d. Echo (2/13) showed EF 55-60%, moderate LVH, mild MR.  e. Lex MV 6/14: no isch, EF 53%;  f. Echo 6/14: mild LVH, EF 55-60%, Gr 1 DD, MAC, mild MR, mild LAE  . Peripheral neuropathy   . Chronic diastolic CHF (congestive heart failure) 06/29/2014    Past Surgical History  Procedure Laterality Date  . Cervical fusion c-4-5 other    . Cervical laminectomy other    .  Tonsillectomy other    . Umbilical hernia repair    . Axillary seat gland removal other    . Right knee arthroscopy other    . Colectomy  2004    left partial for perforation  . Cholecystectomy    . Tubal ligation    . Ptca and stenting      of mid and distal circumflex coronary artery. 1st stent was 04/2006, with drug eluting stent placed on 12/20/07 (30-50% RCA stenosis). Cardiologist Dr. Einar Gip.  . Dilation and curettage of uterus  02/2011  . Hysteroscopy w/d&c  05/04/2012    Procedure: DILATATION AND CURETTAGE /HYSTEROSCOPY;  Surgeon: Lahoma Crocker, MD;  Location: [facility];  Service: Gynecology;  Laterality: N/A;  . Robotic assisted total hysterectomy with bilateral salpingo oopherectomy  07/24/2012    Procedure: ROBOTIC ASSISTED TOTAL HYSTERECTOMY WITH BILATERAL SALPINGO OOPHORECTOMY;  Surgeon: Janie Morning, MD PHD;   Location: [facility];  Service: Gynecology;  Laterality: Bilateral;  attempted ,  converted to abdomnial hysterectomy  . Abdominal hysterectomy  07/24/2012    Procedure: HYSTERECTOMY ABDOMINAL;  Surgeon: Janie Morning, MD PHD;  Location: [facility];  Service: Gynecology;;  . Salpingoophorectomy  07/24/2012    Procedure: SALPINGO OOPHORECTOMY;  Surgeon: Janie Morning, MD PHD;  Location: [facility];  Service: Gynecology;  Laterality: Bilateral;  . Abdominal wound dehiscence  08/01/2012    Procedure: ABDOMINAL WOUND DEHISCENCE;  Surgeon: Lahoma Crocker, MD;  Location: [facility];  Service: Gynecology;  Laterality: N/A;  . Laparotomy  08/02/2012    Procedure: EXPLORATORY LAPAROTOMY;  Surgeon: Imogene Burn. Georgette Dover, MD;  Location: [facility];  Service: General;  Laterality: N/A;  placement of abdominal wound vac  . Colonoscopy N/A 12/22/2014    Procedure: COLONOSCOPY;  Surgeon: Juanita Craver, MD;  Location: [facility];  Service: Endoscopy;  Laterality: N/A;  . Esophagogastroduodenoscopy Left 01/13/2015    Procedure: ESOPHAGOGASTRODUODENOSCOPY (EGD);  Surgeon: Carol Ada, MD;  Location: [facility] ENDOSCOPY;  Service: Endoscopy;  Laterality: Left;    Social History:  reports that she has never smoked. She has never used smokeless tobacco. She reports that she does not drink alcohol or use illicit drugs.  Allergies: No Known Allergies   (Not in a hospital admission)  Blood pressure 155/78, pulse 90, temperature 97.5 F (36.4 C), temperature source Oral, resp. rate 18, SpO2 99 %. Physical Exam: General: pleasant, obese, elderly black female who is laying in bed in NAD HEENT: head is normocephalic, atraumatic.  Sclera are noninjected.  PERRL.  Ears and nose without any masses or lesions.  Mouth is pink but dry Heart: regular, rate, and rhythm.  Normal s1,s2. No obvious murmurs, gallops, or rubs noted.  Palpable radial and pedal pulses bilaterally Lungs: CTAB, no wheezes, rhonchi, or rales noted.  Respiratory effort  nonlabored Abd: soft, quite tender over her midline scar, ND, +BS, no masses or organomegaly.  She has a large area of about 15x10cm of induration and erythema over the mid central portion of her abdomen over her midline scan.  There is an obvious "head" to this with purulent drainage visible underneath.  This area is very tender MS: all 4 extremities are symmetrical with no cyanosis, clubbing, or edema. Skin: warm and dry with no masses, lesions, or rashes Psych: A&Ox3 with an appropriate affect.    Results for orders placed or performed during the hospital encounter of 03/13/15 (from the past 48 hour(s))  Urinalysis, Routine w reflex microscopic (not at [facility])     Status: Abnormal   Collection Time:  03/13/15 12:35 PM  Result Value Ref Range   Color, Urine YELLOW YELLOW   APPearance TURBID (A) CLEAR   Specific Gravity, Urine 1.020 1.005 - 1.030   pH 5.5 5.0 - 8.0   Glucose, UA NEGATIVE NEGATIVE mg/dL   Hgb urine dipstick MODERATE (A) NEGATIVE   Bilirubin Urine MODERATE (A) NEGATIVE   Ketones, ur NEGATIVE NEGATIVE mg/dL   Protein, ur 30 (A) NEGATIVE mg/dL   Urobilinogen, UA 1.0 0.0 - 1.0 mg/dL   Nitrite NEGATIVE NEGATIVE   Leukocytes, UA LARGE (A) NEGATIVE  Urine microscopic-add on     Status: Abnormal   Collection Time: 03/13/15 12:35 PM  Result Value Ref Range   Squamous Epithelial / LPF MANY (A) RARE   WBC, UA 7-10 <3 WBC/hpf   RBC / HPF 11-20 <3 RBC/hpf   Bacteria, UA MANY (A) RARE   Casts GRANULAR CAST (A) NEGATIVE   Urine-Other MUCOUS PRESENT   Comprehensive metabolic panel     Status: Abnormal   Collection Time: 03/13/15 12:46 PM  Result Value Ref Range   Sodium 134 (L) 135 - 145 mmol/L   Potassium 5.0 3.5 - 5.1 mmol/L    Comment: SLIGHT HEMOLYSIS   Chloride 103 101 - 111 mmol/L   CO2 18 (L) 22 - 32 mmol/L   Glucose, Bld 103 (H) 65 - 99 mg/dL   BUN 36 (H) 6 - 20 mg/dL   Creatinine, Ser 6.46 (H) 0.44 - 1.00 mg/dL   Calcium 9.6 8.9 - 72.5 mg/dL   Total Protein 9.5 (H)  6.5 - 8.1 g/dL   Albumin 3.3 (L) 3.5 - 5.0 g/dL   AST 35 15 - 41 U/L   ALT 25 14 - 54 U/L   Alkaline Phosphatase 134 (H) 38 - 126 U/L   Total Bilirubin 1.1 0.3 - 1.2 mg/dL   GFR calc non Af Amer 24 (L) >60 mL/min   GFR calc Af Amer 28 (L) >60 mL/min    Comment: (NOTE) The eGFR has been calculated using the CKD EPI equation. This calculation has not been validated in all clinical situations. eGFR's persistently <60 mL/min signify possible Chronic Kidney Disease.    Anion gap 13 5 - 15  CBC with Differential     Status: Abnormal   Collection Time: 03/13/15 12:46 PM  Result Value Ref Range   WBC 15.5 (H) 4.0 - 10.5 K/uL   RBC 5.21 (H) 3.87 - 5.11 MIL/uL   Hemoglobin 11.3 (L) 12.0 - 15.0 g/dL   HCT 91.2 64.4 - 55.2 %   MCV 69.1 (L) 78.0 - 100.0 fL   MCH 21.7 (L) 26.0 - 34.0 pg   MCHC 31.4 30.0 - 36.0 g/dL   RDW 95.8 94.2 - 36.6 %   Platelets 366 150 - 400 K/uL   Neutrophils Relative % 85 (H) 43 - 77 %   Lymphocytes Relative 7 (L) 12 - 46 %   Monocytes Relative 8 3 - 12 %   Eosinophils Relative 0 0 - 5 %   Basophils Relative 0 0 - 1 %   Neutro Abs 13.2 (H) 1.7 - 7.7 K/uL   Lymphs Abs 1.1 0.7 - 4.0 K/uL   Monocytes Absolute 1.2 (H) 0.1 - 1.0 K/uL   Eosinophils Absolute 0.0 0.0 - 0.7 K/uL   Basophils Absolute 0.0 0.0 - 0.1 K/uL   RBC Morphology POLYCHROMASIA PRESENT   I-Stat CG4 Lactic Acid, ED     Status: Abnormal   Collection Time: 03/13/15  1:10 PM  Result Value Ref Range   Lactic Acid, Venous 2.20 (HH) 0.5 - 2.0 mmol/L   Comment NOTIFIED PHYSICIAN    Ct Abdomen Pelvis Wo Contrast  03/13/2015   CLINICAL DATA:  Abdominal pain. Decreased appetite. History of abdominal hernia.  EXAM: CT ABDOMEN AND PELVIS WITHOUT CONTRAST  TECHNIQUE: Multidetector CT imaging of the abdomen and pelvis was performed following the standard protocol without IV contrast.  COMPARISON:  CT abdomen pelvis 08/10/2012.  FINDINGS: The heart size is normal. Coronary artery calcifications are present. There  is no significant pleural pericardial effusion. The lung bases are clear.  Liver and spleen are within normal limits. The stomach is mildly distended a small hiatal hernia is noted, similar to the prior scan. The duodenum and pancreas are within normal limits. The common bile duct is normal following cholecystectomy. And upper pole right renal cyst is stable. No other focal renal lesions are present. The ureters are normal. Urinary bladder is unremarkable.  Sigmoid diverticula are present without focal inflammation to suggest diverticulitis. An anastomosis in the distal transverse segment is intact. The more proximal colon is unremarkable. There is contrast in the colon to the level of the transverse colon. The terminal ileum is within normal limits.  A ventral hernia repair is evident. There small bowel adhesions anteriorly. A fistula is noted on image 47 of series 2. There is contrast and air code extending across the peritoneal new. A complex subcutaneous collection contains gas and contrast. The collection measures 9.1 x 6.8 x 10.0 cm. This extends to just below the skin surface. The majority of the collection is just above the umbilicus.  The bone windows demonstrate a vacuum disc at L5-S1. There is fusion of anterior osteophytes the lower thoracic spine. No focal lytic or blastic lesions are present. The pelvis is unremarkable.  IMPRESSION: 1. 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 2. Large subcutaneous collection from the fistula of with probable abscess formation measuring 9.1 x 6.8 x 10.0 cm. But there is fluid and air with contrast extending from the bowel and to the collection. 3. Intact colonic anastomosis. 4. Colonic diverticulosis without diverticulitis. 5. Cholecystectomy, hysterectomy, and oophorectomy. 6. Degenerative changes of the lumbar spine at L5-S1. 7. Atherosclerosis including coronary artery disease. These results were  called by telephone at the time of interpretation on 03/13/2015 at 3:14 pm to Dr. Dalia Heading , who verbally acknowledged these results.   Electronically Signed   By: San Morelle M.D.   On: 03/13/2015 15:15       Assessment/Plan 1. 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  Timing per on call surgeons, but not tonight 2. HTN -IV lopressor and prn hydralazine ordered -i have consulted medicine for their assistance. 3. CKD -Baseline Cr 1.4.  Creatinine now 1.9.  Will slowly hydrate given she likely has some dehydration right now 4. Chronic diastolic HF -per medicine, on lasix at home 5. Gout -hold NSAIDs for now given creatinine  6. H/o CAD with stents, not on blood thinners -prn nitro ordered per home med list  Appreciate medicine's assistance with this patient.  Bhakti Labella E 03/13/2015, 4:37 PM Pager: [phone]

## 2015-03-13 NOTE — ED Notes (Addendum)
Delay in lab draw pt using bedside commode 

## 2015-03-13 NOTE — ED Notes (Signed)
Pt c/o abd pain for about a month and not eating.  Pt has seen PCP which is referring her to surgeon for her abd on the 15th of July, for possible hernia.

## 2015-03-13 NOTE — ED Provider Notes (Signed)
CSN: [id number]     Arrival date & time 03/13/15  1122 History   First MD Initiated Contact with Patient 03/13/15 1131     Chief Complaint  Patient presents with  . Abdominal Pain     (Consider location/radiation/quality/duration/timing/severity/associated sxs/prior Treatment) HPI Patient presents to the emergency department with increasing abdominal pain and swelling over the last week.  The patient states that she was seen by her primary care doctor who scheduled an appointment with general surgery for the patient.  The patient's had several abdominal surgeries in the past.  She has a midline scar from hernia repair.  Patient states she feels like her stomach is very distended in this region.  The patient states it feels hot to the touch, with increasing redness.  Patient states she does not have any chest pain, shortness of breath, nausea, vomiting, weakness, dizziness, headache, blurred vision, back pain.  Incontinence, fever or syncope.  The patient states that palpation makes the pain worse Past Medical History  Diagnosis Date  . Hypertension   . Renal insufficiency     baseline Cr ~ 1.3  . Hyperlipidemia   . History of DVT (deep vein thrombosis)   . History of colonic polyps   . Gout   . Polyarthritis   . Microcytic anemia   . OSA (obstructive sleep apnea)   . GERD (gastroesophageal reflux disease)   . Esophagitis   . Hidradenitis suppurativa     s/p axillary sweat gland removal  . H/O blood transfusion reaction     at [facility] hospital  . Headache(784.0)   . Diverticulitis   . Difficulty sleeping   . PMB (postmenopausal bleeding)     X 2 YRS  . Bowel trouble     OCCASIONAL BOWEL INCONTINENCE  . Diabetes mellitus without complication     diet controlled  . Fibroids     s/p TAH/BSO 07/2012  . CAD (coronary artery disease)     a. 8/07 had BMS to OM.  b. In-stent restenosis with later Promus DES to same site.  c. Lexiscan myoview in 1/13 showed EF 67%, no ischemia or  infarction.  d. Echo (2/13) showed EF 55-60%, moderate LVH, mild MR.  e. Lex MV 6/14: no isch, EF 53%;  f. Echo 6/14: mild LVH, EF 55-60%, Gr 1 DD, MAC, mild MR, mild LAE  . Peripheral neuropathy   . Chronic diastolic CHF (congestive heart failure) 06/29/2014   Past Surgical History  Procedure Laterality Date  . Cervical fusion c-4-5 other    . Cervical laminectomy other    . Tonsillectomy other    . Umbilical hernia repair    . Axillary seat gland removal other    . Right knee arthroscopy other    . Colectomy  2004    left partial for perforation  . Cholecystectomy    . Tubal ligation    . Ptca and stenting      of mid and distal circumflex coronary artery. 1st stent was 04/2006, with drug eluting stent placed on 12/20/07 (30-50% RCA stenosis). Cardiologist Dr. Einar Gip.  . Dilation and curettage of uterus  02/2011  . Hysteroscopy w/d&c  05/04/2012    Procedure: DILATATION AND CURETTAGE /HYSTEROSCOPY;  Surgeon: Lahoma Crocker, MD;  Location: [facility];  Service: Gynecology;  Laterality: N/A;  . Robotic assisted total hysterectomy with bilateral salpingo oopherectomy  07/24/2012    Procedure: ROBOTIC ASSISTED TOTAL HYSTERECTOMY WITH BILATERAL SALPINGO OOPHORECTOMY;  Surgeon: Janie Morning, MD PHD;  Location: [facility];  Service: Gynecology;  Laterality: Bilateral;  attempted ,  converted to abdomnial hysterectomy  . Abdominal hysterectomy  07/24/2012    Procedure: HYSTERECTOMY ABDOMINAL;  Surgeon: Janie Morning, MD PHD;  Location: [facility];  Service: Gynecology;;  . Salpingoophorectomy  07/24/2012    Procedure: SALPINGO OOPHORECTOMY;  Surgeon: Janie Morning, MD PHD;  Location: [facility];  Service: Gynecology;  Laterality: Bilateral;  . Abdominal wound dehiscence  08/01/2012    Procedure: ABDOMINAL WOUND DEHISCENCE;  Surgeon: Lahoma Crocker, MD;  Location: [facility];  Service: Gynecology;  Laterality: N/A;  . Laparotomy  08/02/2012    Procedure: EXPLORATORY LAPAROTOMY;  Surgeon: Imogene Burn. Georgette Dover,  MD;  Location: [facility];  Service: General;  Laterality: N/A;  placement of abdominal wound vac  . Colonoscopy N/A 12/22/2014    Procedure: COLONOSCOPY;  Surgeon: Juanita Craver, MD;  Location: [facility];  Service: Endoscopy;  Laterality: N/A;  . Esophagogastroduodenoscopy Left 01/13/2015    Procedure: ESOPHAGOGASTRODUODENOSCOPY (EGD);  Surgeon: Carol Ada, MD;  Location: [facility];  Service: Endoscopy;  Laterality: Left;   Family History  Problem Relation Age of Onset  . Diabetes Mother   . Coronary artery disease Mother   . Diabetes Sister   . Coronary artery disease Sister    History  Substance Use Topics  . Smoking status: Never Smoker   . Smokeless tobacco: Never Used  . Alcohol Use: No   OB History    No data available     Review of Systems  All other systems negative except as documented in the HPI. All pertinent positives and negatives as reviewed in the HPI.  Allergies  Review of patient's allergies indicates no known allergies.  Home Medications   Prior to Admission medications   Medication Sig Start Date End Date Taking? Authorizing Provider  colchicine 0.6 MG tablet Take 1 tablet (0.6 mg total) by mouth 2 (two) times daily. 12/25/14  Yes Reyne Dumas, MD  cyanocobalamin 2000 MCG tablet Take 1 tablet (2,000 mcg total) by mouth daily. 01/07/15  Yes Janith Lima, MD  furosemide (LASIX) 20 MG tablet Take 20 mg by mouth daily as needed for fluid.  11/03/14  Yes Historical Provider, MD  metoprolol tartrate (LOPRESSOR) 25 MG tablet Take 1 tablet (25 mg total) by mouth every morning. 12/25/14  Yes Reyne Dumas, MD  nitroGLYCERIN (NITROSTAT) 0.4 MG SL tablet Place 1 tablet (0.4 mg total) under the tongue every 5 (five) minutes as needed for chest pain. 02/26/13  Yes Larey Dresser, MD  pantoprazole (PROTONIX) 40 MG tablet Take 1 tablet (40 mg total) by mouth daily. 12/25/14  Yes Reyne Dumas, MD  VOLTAREN 1 % GEL Apply 2 g topically 4 (four) times daily as needed (pain).   12/25/14  Yes Historical Provider, MD  amLODipine (NORVASC) 10 MG tablet Take 1 tablet (10 mg total) by mouth daily. 05/29/14   Janith Lima, MD  atorvastatin (LIPITOR) 40 MG tablet Take 1 tablet (40 mg total) by mouth daily. Patient not taking: Reported on 03/13/2015 12/25/14   Reyne Dumas, MD  Ferrous Sulfate (IRON) 325 (65 FE) MG TABS Take 325 mg by mouth 2 (two) times daily. Patient not taking: Reported on 03/13/2015 03/05/15   Janith Lima, MD   BP 155/78 mmHg  Pulse 90  Temp(Src) 97.5 F (36.4 C) (Oral)  Resp 18  SpO2 99% Physical Exam  Constitutional: She is oriented to person, place, and time. She appears well-developed and well-nourished. No distress.  HENT:  Head: Normocephalic and  atraumatic.  Mouth/Throat: Oropharynx is clear and moist.  Eyes: Pupils are equal, round, and reactive to light.  Neck: Normal range of motion. Neck supple.  Cardiovascular: Normal rate, regular rhythm and normal heart sounds.  Exam reveals no gallop and no friction rub.   No murmur heard. Pulmonary/Chest: Effort normal and breath sounds normal. No respiratory distress.  Abdominal: Soft. Bowel sounds are normal. She exhibits distension. There is tenderness. There is guarding.    Neurological: She is alert and oriented to person, place, and time. She exhibits normal muscle tone. Coordination normal.  Skin: Skin is warm and dry. No rash noted. No pallor.  Nursing note and vitals reviewed.   ED Course  Procedures (including critical care time) Labs Review Labs Reviewed  COMPREHENSIVE METABOLIC PANEL - Abnormal; Notable for the following:    Sodium 134 (*)    CO2 18 (*)    Glucose, Bld 103 (*)    BUN 36 (*)    Creatinine, Ser 1.93 (*)    Total Protein 9.5 (*)    Albumin 3.3 (*)    Alkaline Phosphatase 134 (*)    GFR calc non Af Amer 24 (*)    GFR calc Af Amer 28 (*)    All other components within normal limits  CBC WITH DIFFERENTIAL/PLATELET - Abnormal; Notable for the following:    WBC  15.5 (*)    RBC 5.21 (*)    Hemoglobin 11.3 (*)    MCV 69.1 (*)    MCH 21.7 (*)    Neutrophils Relative % 85 (*)    Lymphocytes Relative 7 (*)    Neutro Abs 13.2 (*)    Monocytes Absolute 1.2 (*)    All other components within normal limits  URINALYSIS, ROUTINE W REFLEX MICROSCOPIC (NOT AT [facility]) - Abnormal; Notable for the following:    APPearance TURBID (*)    Hgb urine dipstick MODERATE (*)    Bilirubin Urine MODERATE (*)    Protein, ur 30 (*)    Leukocytes, UA LARGE (*)    All other components within normal limits  URINE MICROSCOPIC-ADD ON - Abnormal; Notable for the following:    Squamous Epithelial / LPF MANY (*)    Bacteria, UA MANY (*)    Casts GRANULAR CAST (*)    All other components within normal limits  I-STAT CG4 LACTIC ACID, ED - Abnormal; Notable for the following:    Lactic Acid, Venous 2.20 (*)    All other components within normal limits    Imaging Review Ct Abdomen Pelvis Wo Contrast  03/13/2015   CLINICAL DATA:  Abdominal pain. Decreased appetite. History of abdominal hernia.  EXAM: CT ABDOMEN AND PELVIS WITHOUT CONTRAST  TECHNIQUE: Multidetector CT imaging of the abdomen and pelvis was performed following the standard protocol without IV contrast.  COMPARISON:  CT abdomen pelvis 08/10/2012.  FINDINGS: The heart size is normal. Coronary artery calcifications are present. There is no significant pleural pericardial effusion. The lung bases are clear.  Liver and spleen are within normal limits. The stomach is mildly distended a small hiatal hernia is noted, similar to the prior scan. The duodenum and pancreas are within normal limits. The common bile duct is normal following cholecystectomy. And upper pole right renal cyst is stable. No other focal renal lesions are present. The ureters are normal. Urinary bladder is unremarkable.  Sigmoid diverticula are present without focal inflammation to suggest diverticulitis. An anastomosis in the distal transverse segment is  intact. The more proximal colon is  unremarkable. There is contrast in the colon to the level of the transverse colon. The terminal ileum is within normal limits.  A ventral hernia repair is evident. There small bowel adhesions anteriorly. A fistula is noted on image 47 of series 2. There is contrast and air code extending across the peritoneal new. A complex subcutaneous collection contains gas and contrast. The collection measures 9.1 x 6.8 x 10.0 cm. This extends to just below the skin surface. The majority of the collection is just above the umbilicus.  The bone windows demonstrate a vacuum disc at L5-S1. There is fusion of anterior osteophytes the lower thoracic spine. No focal lytic or blastic lesions are present. The pelvis is unremarkable.  IMPRESSION: 1. 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 2. Large subcutaneous collection from the fistula of with probable abscess formation measuring 9.1 x 6.8 x 10.0 cm. But there is fluid and air with contrast extending from the bowel and to the collection. 3. Intact colonic anastomosis. 4. Colonic diverticulosis without diverticulitis. 5. Cholecystectomy, hysterectomy, and oophorectomy. 6. Degenerative changes of the lumbar spine at L5-S1. 7. Atherosclerosis including coronary artery disease. These results were called by telephone at the time of interpretation on 03/13/2015 at 3:14 pm to Dr. Dalia Heading , who verbally acknowledged these results.   Electronically Signed   By: San Morelle M.D.   On: 03/13/2015 15:15   I spoke with general surgery about the patient and they will come to evaluate the patient for surgical intervention.   Dalia Heading, PA-C 03/13/15 1533  Fredia Sorrow, MD 03/13/15 1538

## 2015-03-13 NOTE — Progress Notes (Signed)
Patient came to the floor from ED at the point of transfer began bleeding purulent and sanguineous drainage from mid abdomen above umbilicus, odor noted along with drainage. Patient complained of pain during transfer. 100 cc of drainage collected from site. MD notified and instructed to administer pain medication and dress site with gauze and abd pads, continue to monitor patient. Vital signs stable.

## 2015-03-14 DIAGNOSIS — G4733 Obstructive sleep apnea (adult) (pediatric): Secondary | ICD-10-CM

## 2015-03-14 DIAGNOSIS — D509 Iron deficiency anemia, unspecified: Secondary | ICD-10-CM

## 2015-03-14 LAB — GLUCOSE, CAPILLARY
GLUCOSE-CAPILLARY: 94 mg/dL (ref 65–99)
GLUCOSE-CAPILLARY: 97 mg/dL (ref 65–99)
Glucose-Capillary: 119 mg/dL — ABNORMAL HIGH (ref 65–99)
Glucose-Capillary: 88 mg/dL (ref 65–99)
Glucose-Capillary: 93 mg/dL (ref 65–99)

## 2015-03-14 LAB — BASIC METABOLIC PANEL
Anion gap: 9 (ref 5–15)
BUN: 32 mg/dL — AB (ref 6–20)
CALCIUM: 8.9 mg/dL (ref 8.9–10.3)
CO2: 19 mmol/L — ABNORMAL LOW (ref 22–32)
Chloride: 106 mmol/L (ref 101–111)
Creatinine, Ser: 1.64 mg/dL — ABNORMAL HIGH (ref 0.44–1.00)
GFR calc Af Amer: 34 mL/min — ABNORMAL LOW (ref >60)
GFR calc non Af Amer: 30 mL/min — ABNORMAL LOW (ref >60)
Glucose, Bld: 112 mg/dL — ABNORMAL HIGH (ref 65–99)
Potassium: 4.3 mmol/L (ref 3.5–5.1)
Sodium: 134 mmol/L — ABNORMAL LOW (ref 135–145)

## 2015-03-14 LAB — CORTISOL: Cortisol, Plasma: 14.5 ug/dL

## 2015-03-14 LAB — CBC
HCT: 28.8 % — ABNORMAL LOW (ref 36.0–46.0)
Hemoglobin: 8.4 g/dL — ABNORMAL LOW (ref 12.0–15.0)
MCH: 20.4 pg — ABNORMAL LOW (ref 26.0–34.0)
MCHC: 29.2 g/dL — ABNORMAL LOW (ref 30.0–36.0)
MCV: 69.9 fL — AB (ref 78.0–100.0)
PLATELETS: 318 10*3/uL (ref 150–400)
RBC: 4.12 MIL/uL (ref 3.87–5.11)
RDW: 14.5 % (ref 11.5–15.5)
WBC: 10.6 10*3/uL — ABNORMAL HIGH (ref 4.0–10.5)

## 2015-03-14 LAB — MAGNESIUM: Magnesium: 1.8 mg/dL (ref 1.7–2.4)

## 2015-03-14 LAB — PHOSPHORUS: Phosphorus: 3.5 mg/dL (ref 2.5–4.6)

## 2015-03-14 MED ORDER — SODIUM CHLORIDE 0.9 % IV BOLUS (SEPSIS)
1000.0000 mL | Freq: Once | INTRAVENOUS | Status: AC
  Administered 2015-03-14: 1000 mL via INTRAVENOUS

## 2015-03-14 MED ORDER — DICLOFENAC SODIUM 1 % TD GEL
4.0000 g | Freq: Four times a day (QID) | TRANSDERMAL | Status: DC
  Administered 2015-03-14 – 2015-03-27 (×46): 4 g via TOPICAL
  Filled 2015-03-14 (×2): qty 100

## 2015-03-14 NOTE — Progress Notes (Signed)
Pt has NGT in place and will not be using cpap tonight.  Equipment remains in room for future use.  RN aware.

## 2015-03-14 NOTE — Progress Notes (Signed)
This RT just noted that rx for cpap was discontinued.  Equipment removed from room.  RN aware.

## 2015-03-14 NOTE — Progress Notes (Signed)
Utilization review completed.  

## 2015-03-14 NOTE — Progress Notes (Signed)
PARENTERAL NUTRITION CONSULT NOTE - INITIAL  Pharmacy Consult for TPN Indication: bowel rest, abdominal abscess  No Known Allergies  Patient Measurements: Height: 5\' 4"  (162.6 cm) Weight: 222 lb 10.6 oz (101 kg) IBW/kg (Calculated) : 54.7 Adjusted Body Weight: ~ 70kg Usual Weight:   Vital Signs: Temp: 97.7 F (36.5 C) (07/02 0535) Temp Source: Oral (07/02 0535) BP: 96/52 mmHg (07/02 0535) Pulse Rate: 81 (07/02 0535) Intake/Output from previous day: 07/01 0701 - 07/02 0700 In: 960 [I.V.:960] Out: 102 [Urine:2; Emesis/NG output:50; Drains:50] Intake/Output from this shift:    Labs:  Recent Labs  03/13/15 1246 03/14/15 0445  WBC 15.5* 10.6*  HGB 11.3* 8.4*  HCT 36.0 28.8*  PLT 366 318    Recent Labs  03/13/15 1246 03/14/15 0445  NA 134* 134*  K 5.0 4.3  CL 103 106  CO2 18* 19*  GLUCOSE 103* 112*  BUN 36* 32*  CREATININE 1.93* 1.64*  CALCIUM 9.6 8.9  PROT 9.5*  --   ALBUMIN 3.3*  --   AST 35  --   ALT 25  --   ALKPHOS 134*  --   BILITOT 1.1  --    Estimated Creatinine Clearance: 34.8 mL/min (by C-G formula based on Cr of 1.64).    Recent Labs  03/13/15 2103 03/14/15 0018 03/14/15 0554  GLUCAP 104* 119* 94   Medical History: Past Medical History  Diagnosis Date  . Hypertension   . Renal insufficiency     baseline Cr ~ 1.3  . Hyperlipidemia   . History of DVT (deep vein thrombosis)   . History of colonic polyps   . Gout   . Polyarthritis   . Microcytic anemia   . OSA (obstructive sleep apnea)   . GERD (gastroesophageal reflux disease)   . Esophagitis   . Hidradenitis suppurativa     s/p axillary sweat gland removal  . H/O blood transfusion reaction     at [facility] hospital  . Headache(784.0)   . Diverticulitis   . Difficulty sleeping   . PMB (postmenopausal bleeding)     X 2 YRS  . Bowel trouble     OCCASIONAL BOWEL INCONTINENCE  . Diabetes mellitus without complication     diet controlled  . Fibroids     s/p TAH/BSO 07/2012  .  CAD (coronary artery disease)     a. 8/07 had BMS to OM.  b. In-stent restenosis with later Promus DES to same site.  c. Lexiscan myoview in 1/13 showed EF 67%, no ischemia or infarction.  d. Echo (2/13) showed EF 55-60%, moderate LVH, mild MR.  e. Lex MV 6/14: no isch, EF 53%;  f. Echo 6/14: mild LVH, EF 55-60%, Gr 1 DD, MAC, mild MR, mild LAE  . Peripheral neuropathy   . Chronic diastolic CHF (congestive heart failure) 06/29/2014   Insulin Requirements: Moderate scale Novolog q6h  Current Nutrition: NPO  IVF: D5 & 1/2NS with 20 mEq KCl at 100 ml/hr  Central access:  TPN start date:   ASSESSMENT  HPI: admit 7/1 with abdominal pain, per CT: mesh erosion (previous hernia repair) into small bowel with abscess and fistula. No surgery today, begin TPN. Current NG suction for EC fistula.  No IV team RN available today for PICC placement, could be done in IR if able to schedule  Significant events:   Today:    Glucose -   Electrolytes -  Renal -  LFTs -  TGs -  Prealbumin -  NUTRITIONAL GOALS                                                                                             RD recs: await recommendations Clinimix E 5/15 at a goal rate of 83 ml/hr + 20% fat emulsion at 83 ml/hr to provide: 100 g/day protein, 1910 Kcal/day.  PLAN                                                                                                                         At 1800 today: if able to place PICC line  Start Clinimix E 5/15 at 40 ml/hr.  20% fat emulsion at 10 ml/hr.  Plan to advance as tolerated to the goal rate.  TPN to contain standard multivitamins and trace elements.  Would reduce IVF to 60 ml/hr.  Continue SSI .   TPN lab panels on Mondays & Thursdays.  F/u daily.  Minda Ditto PharmD Pager [phone] 03/14/2015, 3:07 PM

## 2015-03-14 NOTE — Progress Notes (Signed)
Dr. Sheran Fava aware via text of pt status. Inquired about Heparin. Md aware pt had several spots of bleeding on pad this am. Abdominal drainage continues. See new orders entered into EPIC by Dr. Sheran Fava.

## 2015-03-14 NOTE — Progress Notes (Signed)
Patient ID: Marissa [name], female   DOB: Apr 13, 1941, 74 y.o.   MRN: [id number] [facility] Surgery Progress Note:   * No surgery found *  Subjective: Mental status is fairly clear.   Objective: Vital signs in last 24 hours: Temp:  [97.5 F (36.4 C)-99.4 F (37.4 C)] 97.7 F (36.5 C) (07/02 0535) Pulse Rate:  [63-106] 81 (07/02 0535) Resp:  [14-20] 16 (07/02 0535) BP: (86-166)/(49-78) 96/52 mmHg (07/02 0535) SpO2:  [96 %-100 %] 100 % (07/02 0535) Weight:  [101 kg (222 lb 10.6 oz)] 101 kg (222 lb 10.6 oz) (07/02 0226)  Intake/Output from previous day: 07/01 0701 - 07/02 0700 In: 960 [I.V.:960] Out: 102 [Urine:2; Emesis/NG output:50; Drains:50] Intake/Output this shift:    Physical Exam: Work of breathing is not labored.  Midline draining EC fistula with bloody contents in bag.  Abdomen is nontender  Lab Results:  Results for orders placed or performed during the hospital encounter of 03/13/15 (from the past 48 hour(s))  Urinalysis, Routine w reflex microscopic (not at [facility])     Status: Abnormal   Collection Time: 03/13/15 12:35 PM  Result Value Ref Range   Color, Urine YELLOW YELLOW   APPearance TURBID (A) CLEAR   Specific Gravity, Urine 1.020 1.005 - 1.030   pH 5.5 5.0 - 8.0   Glucose, UA NEGATIVE NEGATIVE mg/dL   Hgb urine dipstick MODERATE (A) NEGATIVE   Bilirubin Urine MODERATE (A) NEGATIVE   Ketones, ur NEGATIVE NEGATIVE mg/dL   Protein, ur 30 (A) NEGATIVE mg/dL   Urobilinogen, UA 1.0 0.0 - 1.0 mg/dL   Nitrite NEGATIVE NEGATIVE   Leukocytes, UA LARGE (A) NEGATIVE  Urine microscopic-add on     Status: Abnormal   Collection Time: 03/13/15 12:35 PM  Result Value Ref Range   Squamous Epithelial / LPF MANY (A) RARE   WBC, UA 7-10 <3 WBC/hpf   RBC / HPF 11-20 <3 RBC/hpf   Bacteria, UA MANY (A) RARE   Casts GRANULAR CAST (A) NEGATIVE   Urine-Other MUCOUS PRESENT   Comprehensive metabolic panel     Status: Abnormal   Collection Time: 03/13/15 12:46 PM  Result Value  Ref Range   Sodium 134 (L) 135 - 145 mmol/L   Potassium 5.0 3.5 - 5.1 mmol/L    Comment: SLIGHT HEMOLYSIS   Chloride 103 101 - 111 mmol/L   CO2 18 (L) 22 - 32 mmol/L   Glucose, Bld 103 (H) 65 - 99 mg/dL   BUN 36 (H) 6 - 20 mg/dL   Creatinine, Ser 1.93 (H) 0.44 - 1.00 mg/dL   Calcium 9.6 8.9 - 10.3 mg/dL   Total Protein 9.5 (H) 6.5 - 8.1 g/dL   Albumin 3.3 (L) 3.5 - 5.0 g/dL   AST 35 15 - 41 U/L   ALT 25 14 - 54 U/L   Alkaline Phosphatase 134 (H) 38 - 126 U/L   Total Bilirubin 1.1 0.3 - 1.2 mg/dL   GFR calc non Af Amer 24 (L) >60 mL/min   GFR calc Af Amer 28 (L) >60 mL/min    Comment: (NOTE) The eGFR has been calculated using the CKD EPI equation. This calculation has not been validated in all clinical situations. eGFR's persistently <60 mL/min signify possible Chronic Kidney Disease.    Anion gap 13 5 - 15  CBC with Differential     Status: Abnormal   Collection Time: 03/13/15 12:46 PM  Result Value Ref Range   WBC 15.5 (H) 4.0 - 10.5 K/uL  RBC 5.21 (H) 3.87 - 5.11 MIL/uL   Hemoglobin 11.3 (L) 12.0 - 15.0 g/dL   HCT 36.0 36.0 - 46.0 %   MCV 69.1 (L) 78.0 - 100.0 fL   MCH 21.7 (L) 26.0 - 34.0 pg   MCHC 31.4 30.0 - 36.0 g/dL   RDW 14.4 11.5 - 15.5 %   Platelets 366 150 - 400 K/uL   Neutrophils Relative % 85 (H) 43 - 77 %   Lymphocytes Relative 7 (L) 12 - 46 %   Monocytes Relative 8 3 - 12 %   Eosinophils Relative 0 0 - 5 %   Basophils Relative 0 0 - 1 %   Neutro Abs 13.2 (H) 1.7 - 7.7 K/uL   Lymphs Abs 1.1 0.7 - 4.0 K/uL   Monocytes Absolute 1.2 (H) 0.1 - 1.0 K/uL   Eosinophils Absolute 0.0 0.0 - 0.7 K/uL   Basophils Absolute 0.0 0.0 - 0.1 K/uL   RBC Morphology POLYCHROMASIA PRESENT   I-Stat CG4 Lactic Acid, ED     Status: Abnormal   Collection Time: 03/13/15  1:10 PM  Result Value Ref Range   Lactic Acid, Venous 2.20 (HH) 0.5 - 2.0 mmol/L   Comment NOTIFIED PHYSICIAN   Glucose, capillary     Status: Abnormal   Collection Time: 03/13/15  9:03 PM  Result Value Ref  Range   Glucose-Capillary 104 (H) 65 - 99 mg/dL  Glucose, capillary     Status: Abnormal   Collection Time: 03/14/15 12:18 AM  Result Value Ref Range   Glucose-Capillary 119 (H) 65 - 99 mg/dL  Basic metabolic panel     Status: Abnormal   Collection Time: 03/14/15  4:45 AM  Result Value Ref Range   Sodium 134 (L) 135 - 145 mmol/L   Potassium 4.3 3.5 - 5.1 mmol/L   Chloride 106 101 - 111 mmol/L   CO2 19 (L) 22 - 32 mmol/L   Glucose, Bld 112 (H) 65 - 99 mg/dL   BUN 32 (H) 6 - 20 mg/dL   Creatinine, Ser 1.64 (H) 0.44 - 1.00 mg/dL   Calcium 8.9 8.9 - 10.3 mg/dL   GFR calc non Af Amer 30 (L) >60 mL/min   GFR calc Af Amer 34 (L) >60 mL/min    Comment: (NOTE) The eGFR has been calculated using the CKD EPI equation. This calculation has not been validated in all clinical situations. eGFR's persistently <60 mL/min signify possible Chronic Kidney Disease.    Anion gap 9 5 - 15  CBC     Status: Abnormal   Collection Time: 03/14/15  4:45 AM  Result Value Ref Range   WBC 10.6 (H) 4.0 - 10.5 K/uL   RBC 4.12 3.87 - 5.11 MIL/uL   Hemoglobin 8.4 (L) 12.0 - 15.0 g/dL    Comment: REPEATED TO VERIFY DELTA CHECK NOTED    HCT 28.8 (L) 36.0 - 46.0 %   MCV 69.9 (L) 78.0 - 100.0 fL   MCH 20.4 (L) 26.0 - 34.0 pg   MCHC 29.2 (L) 30.0 - 36.0 g/dL   RDW 14.5 11.5 - 15.5 %   Platelets 318 150 - 400 K/uL  Glucose, capillary     Status: None   Collection Time: 03/14/15  5:54 AM  Result Value Ref Range   Glucose-Capillary 94 65 - 99 mg/dL    Radiology/Results: Ct Abdomen Pelvis Wo Contrast  03/13/2015   CLINICAL DATA:  Abdominal pain. Decreased appetite. History of abdominal hernia.  EXAM: CT ABDOMEN AND PELVIS  WITHOUT CONTRAST  TECHNIQUE: Multidetector CT imaging of the abdomen and pelvis was performed following the standard protocol without IV contrast.  COMPARISON:  CT abdomen pelvis 08/10/2012.  FINDINGS: The heart size is normal. Coronary artery calcifications are present. There is no significant  pleural pericardial effusion. The lung bases are clear.  Liver and spleen are within normal limits. The stomach is mildly distended a small hiatal hernia is noted, similar to the prior scan. The duodenum and pancreas are within normal limits. The common bile duct is normal following cholecystectomy. And upper pole right renal cyst is stable. No other focal renal lesions are present. The ureters are normal. Urinary bladder is unremarkable.  Sigmoid diverticula are present without focal inflammation to suggest diverticulitis. An anastomosis in the distal transverse segment is intact. The more proximal colon is unremarkable. There is contrast in the colon to the level of the transverse colon. The terminal ileum is within normal limits.  A ventral hernia repair is evident. There small bowel adhesions anteriorly. A fistula is noted on image 47 of series 2. There is contrast and air code extending across the peritoneal new. A complex subcutaneous collection contains gas and contrast. The collection measures 9.1 x 6.8 x 10.0 cm. This extends to just below the skin surface. The majority of the collection is just above the umbilicus.  The bone windows demonstrate a vacuum disc at L5-S1. There is fusion of anterior osteophytes the lower thoracic spine. No focal lytic or blastic lesions are present. The pelvis is unremarkable.  IMPRESSION: 1. 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 2. Large subcutaneous collection from the fistula of with probable abscess formation measuring 9.1 x 6.8 x 10.0 cm. But there is fluid and air with contrast extending from the bowel and to the collection. 3. Intact colonic anastomosis. 4. Colonic diverticulosis without diverticulitis. 5. Cholecystectomy, hysterectomy, and oophorectomy. 6. Degenerative changes of the lumbar spine at L5-S1. 7. Atherosclerosis including coronary artery disease. These results were called by  telephone at the time of interpretation on 03/13/2015 at 3:14 pm to Dr. Dalia Heading , who verbally acknowledged these results.   Electronically Signed   By: San Morelle M.D.   On: 03/13/2015 15:15    Anti-infectives: Anti-infectives    Start     Dose/Rate Route Frequency Ordered Stop   03/13/15 2000  ertapenem (INVANZ) 1 g in sodium chloride 0.9 % 50 mL IVPB     1 g 100 mL/hr over 30 Minutes Intravenous Every 24 hours 03/13/15 1829        Assessment/Plan: Problem List: Patient Active Problem List   Diagnosis Date Noted  . 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 Abdominal wall abscess 03/13/2015  . Non-compliant behavior 01/22/2015  . Diarrhea 01/07/2015  . Anemia, iron deficiency 12/21/2014  . Diverticulosis of colon with hemorrhage 12/20/2014  . Chronic diastolic CHF (congestive heart failure) 06/29/2014  . Routine general medical examination at a health care facility 05/30/2014  . Dementia arising in the senium and presenium 03/29/2013  . Acute gouty arthritis 02/15/2012  . Other screening mammogram 02/15/2012  . Osteopenia 02/15/2012  . 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 Hyperlipidemia with target LDL less than 100 10/06/2011  . OSA (obstructive sleep apnea) 04/02/2009  . GERD 08/20/2006  . Gout 08/15/2006  . Essential hypertension 08/15/2006  . Coronary atherosclerosis 08/15/2006  . Chronic kidney disease, stage 3 08/15/2006  . DVT, HX OF 08/15/2006    Begin  TNA and continue NG suction for EC fistula * No surgery found *    LOS: 1 day   Matt B. Hassell Done, MD, [facility] [phone] beeper [phone]  03/14/2015 10:06 AM

## 2015-03-14 NOTE — Progress Notes (Addendum)
[facility] PROGRESS NOTE  Marissa [name] NGE:[id number] DOB: [birth year] DOA: 03/13/2015 PCP: [name], MD  Brief Summary  The patient is a 74 year old female with history of CAD s/p multiple stents, chronic diastolic heart failure, diabetes mellitus with peripheral neuropathy, DVT no longer on anticoagulation secondary to recurrent diverticular and gastric bleeding, gout, obstructive sleep apnea, and multiple abdominal surgeries. She presented with abdominal swelling, erythema, pain, warmth, and anorexia. CT scan of abdomen and pelvis demonstrated a large subcutaneous abscess 9.1 x 6.8 x 10 cm with a fistula that extended to the abdominal wall. The fistula appeared to originate at the small bowel.  Her abscess spontaneously burst the night of admission.  Surgery is attending and we are assisting with medical management.  Assessment/Plan  Enterocutaneous fistula, may have been caused by diverticulitis that eroded or by reopening of a bowel leak that had spontaneously closed previously. - General surgery managing - Currently nothing by mouth and starting TPN - Ertapenem day 2  Hypotension likely secondary to hypovolemia from poor oral intake, improved pain control -  Continue IVF -  Afebrile and WBC trending down so doubt sepsis -  Was not on steroids, but will check cortisol level  CAD, HTN, and HLD, BP is elevated, having atypical chest pains - ECG pending - Holding all oral medication - d/c metoprolol due to hypotension - continue prn hydralazine  Chronic diastolic heart failure, last ECHO 07/2014 with preserved EF and mild concentric LVH - no swelling to suggest volume overload  Acute kidney injury likely due to dehydration and sepsis. Baseline creatinine is 1.4. Peak creatinine 1.9.  - UA: Concentrated, granular casts - Urine culture pending - IVF and trend creatinine  Diet-controlled diabetes mellitus - q6h CBG with low dose SSI, but if A1c < 6.5 or if  CBG persistently < 140, okay to d/c  - f/u A1c   Recurrent diverticular hemorrhage and antral gastritis with three admissions in the last three months for bleeding, last admission required two clips to stop gastric bleeding on 01/13/2015 -  Monitor for bleeding  Gout, with knee and left heel pain, hold colchicine.  -  Avoid NSAIDS -  No PO medications -  May use low dose IV solumedrol sparingly should she have a flare -  Start voltaren gel  Hx of DVT, not on anticoagulation - Please maintain DVT prophylaxis, currently on heparin   OSA, stable, d/c cpap  Leukocytosis, likely related to underlying infection, trending down - abx as above  Microcytic anemia, likely due to iron deficiency and inflammation, trending down.  Is at risk for upper and lower GIB - Consider IV iron once BP stable and infection better treated  Diet:  NPO Access:  PIV (PICC line pending_ IVF:  Yes >> TPN to start tonight Proph:  heparin  Code Status: full Family Communication: patient alone Disposition Plan: pending improvement in blood pressure and plan for long-term nutrition.  Fistula repair per Surgery   Procedures:  CT a/p 7/2:  large subcutaneous abscess 9.1 x 6.8 x 10 cm with a fistula that extends to the abdominal wall. The fistula appears to originate at the small bowel. Her colonic anastomosis appears to be intact and she has colonic diverticulosis without diverticulitis  PICC line pending  Antibiotics:  Ertapenem 7/1 >   HPI/Subjective:  Abdominal pain and pressure improved after abscess burst open last night. She states she has no appetite but would like to be able to drink something. Her NG tube is painful in  her nose. She has been hypotensive overnight since her abscess burst open but denies lightheadedness, dizziness. She denies shortness of breath. She is having some moderate pain posterior left heel and mild pain in her bilateral knees, worse with movement. She denies redness,  swelling, or warmth of these joints.  Objective: Filed Vitals:   03/13/15 2108 03/14/15 0045 03/14/15 0226 03/14/15 0535  BP: 121/57 86/49 97/69  96/52  Pulse: 101 84 63 81  Temp: 99.4 F (37.4 C) 99.3 F (37.4 C) 98.9 F (37.2 C) 97.7 F (36.5 C)  TempSrc: Oral Oral Oral Oral  Resp: 20 18 14 16   Height:   5\' 4"  (1.626 m)   Weight:   101 kg (222 lb 10.6 oz)   SpO2: 100% 96% 100% 100%    Intake/Output Summary (Last 24 hours) at 03/14/15 1237 Last data filed at 03/14/15 1022  Gross per 24 hour  Intake 1396.67 ml  Output    152 ml  Net 1244.67 ml   Filed Weights   03/14/15 0226  Weight: 101 kg (222 lb 10.6 oz)    Exam:  Body mass index is 38.2 kg/(m^2).   General:  Obese female, No acute distress, NG tube and nose  HEENT:  NCAT, MMM  Cardiovascular:  RRR, nl S1, S2 no mrg, 2+ pulses, warm extremities  Respiratory:  CTAB, no increased WOB  Abdomen:   Hypoactive bowel sounds, soft, ostomy bag is full of dark brown thin fluid. The area of erythema has receded dramatically and her abdomen is less swollen  MSK:   Normal tone and bulk, trace nonpitting edema bilateral ankles. She has minimal tenderness at the Achilles insertion of her left heel. She has mild warmth of the left knee without effusion, severe tenderness to palpation, or erythema. She has minimal tenderness of the right knee without effusion, erythema, or warmth  Neuro:  Grossly intact  Data Reviewed: Basic Metabolic Panel:  Recent Labs Lab 03/13/15 1246 03/14/15 0445  NA 134* 134*  K 5.0 4.3  CL 103 106  CO2 18* 19*  GLUCOSE 103* 112*  BUN 36* 32*  CREATININE 1.93* 1.64*  CALCIUM 9.6 8.9   Liver Function Tests:  Recent Labs Lab 03/13/15 1246  AST 35  ALT 25  ALKPHOS 134*  BILITOT 1.1  PROT 9.5*  ALBUMIN 3.3*   No results for input(s): LIPASE, AMYLASE in the last 168 hours. No results for input(s): AMMONIA in the last 168 hours. CBC:  Recent Labs Lab 03/13/15 1246 03/14/15 0445   WBC 15.5* 10.6*  NEUTROABS 13.2*  --   HGB 11.3* 8.4*  HCT 36.0 28.8*  MCV 69.1* 69.9*  PLT 366 318   Cardiac Enzymes: No results for input(s): CKTOTAL, CKMB, CKMBINDEX, TROPONINI in the last 168 hours. BNP (last 3 results)  Recent Labs  11/02/14 2340  BNP 109.2*    ProBNP (last 3 results) No results for input(s): PROBNP in the last 8760 hours.  CBG:  Recent Labs Lab 03/13/15 2103 03/14/15 0018 03/14/15 0554 03/14/15 1203  GLUCAP 104* 119* 94 97    Recent Results (from the past 240 hour(s))  Culture, Urine     Status: None (Preliminary result)   Collection Time: 03/13/15 12:48 PM  Result Value Ref Range Status   Specimen Description URINE, RANDOM  Final   Special Requests NONE  Final   Culture   Final    CULTURE REINCUBATED FOR BETTER GROWTH Performed at [facility]    Report Status PENDING  Incomplete  Studies: Ct Abdomen Pelvis Wo Contrast  03/13/2015   CLINICAL DATA:  Abdominal pain. Decreased appetite. History of abdominal hernia.  EXAM: CT ABDOMEN AND PELVIS WITHOUT CONTRAST  TECHNIQUE: Multidetector CT imaging of the abdomen and pelvis was performed following the standard protocol without IV contrast.  COMPARISON:  CT abdomen pelvis 08/10/2012.  FINDINGS: The heart size is normal. Coronary artery calcifications are present. There is no significant pleural pericardial effusion. The lung bases are clear.  Liver and spleen are within normal limits. The stomach is mildly distended a small hiatal hernia is noted, similar to the prior scan. The duodenum and pancreas are within normal limits. The common bile duct is normal following cholecystectomy. And upper pole right renal cyst is stable. No other focal renal lesions are present. The ureters are normal. Urinary bladder is unremarkable.  Sigmoid diverticula are present without focal inflammation to suggest diverticulitis. An anastomosis in the distal transverse segment is intact. The more proximal colon is  unremarkable. There is contrast in the colon to the level of the transverse colon. The terminal ileum is within normal limits.  A ventral hernia repair is evident. There small bowel adhesions anteriorly. A fistula is noted on image 47 of series 2. There is contrast and air code extending across the peritoneal new. A complex subcutaneous collection contains gas and contrast. The collection measures 9.1 x 6.8 x 10.0 cm. This extends to just below the skin surface. The majority of the collection is just above the umbilicus.  The bone windows demonstrate a vacuum disc at L5-S1. There is fusion of anterior osteophytes the lower thoracic spine. No focal lytic or blastic lesions are present. The pelvis is unremarkable.  IMPRESSION: 1. 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 2. Large subcutaneous collection from the fistula of with probable abscess formation measuring 9.1 x 6.8 x 10.0 cm. But there is fluid and air with contrast extending from the bowel and to the collection. 3. Intact colonic anastomosis. 4. Colonic diverticulosis without diverticulitis. 5. Cholecystectomy, hysterectomy, and oophorectomy. 6. Degenerative changes of the lumbar spine at L5-S1. 7. Atherosclerosis including coronary artery disease. These results were called by telephone at the time of interpretation on 03/13/2015 at 3:14 pm to Dr. Dalia Heading , who verbally acknowledged these results.   Electronically Signed   By: San Morelle M.D.   On: 03/13/2015 15:15    Scheduled Meds: . diclofenac sodium  4 g Topical QID  . ertapenem  1 g Intravenous Q24H  . heparin  5,000 Units Subcutaneous 3 times per day  . insulin aspart  0-9 Units Subcutaneous 4 times per day  . pantoprazole (PROTONIX) IV  40 mg Intravenous QHS   Continuous Infusions: . dextrose 5 % and 0.45 % NaCl with KCl 20 mEq/L 100 mL/hr at 03/14/15 9937    Principal Problem:   Enterocutaneous  fistula Active Problems:   Gout   OSA (obstructive sleep apnea)   Essential hypertension   Coronary atherosclerosis   Chronic diastolic CHF (congestive heart failure)   Anemia, iron deficiency   Abdominal wall abscess    Time spent: 30 min    Cristofher Livecchi, [facility] Pager [phone]. If 7PM-7AM, please contact night-coverage at [email or web address], password [facility] 03/14/2015, 12:37 PM  LOS: 1 day

## 2015-03-14 NOTE — Progress Notes (Signed)
Dr. Hassell Done aware earlier in shift via text that there was no PICC nurse available today. Followed up via phone at [place] to learn MD received text. Dr. Hassell Done made aware there may be PICC nurse tomorrow. No new orders received.

## 2015-03-14 NOTE — Progress Notes (Signed)
Dr. Sheran Fava aware of recent BP 117/61 with HR 75. Lopressor due IV. Inquired about parameters for med. Earlier BP's much lower. Pt asymptomatic. Medication discontinued by MD. Dr. Sheran Fava also made aware of continued pain in heels and now in knees. Pt and daughter reports "It's my gout". Md aware of complaint on rounds to assess. No new orders received at this time.

## 2015-03-15 ENCOUNTER — Inpatient Hospital Stay (HOSPITAL_COMMUNITY): Payer: Medicare Other

## 2015-03-15 LAB — URINE CULTURE

## 2015-03-15 LAB — DIFFERENTIAL
BASOS ABS: 0 10*3/uL (ref 0.0–0.1)
Basophils Relative: 0 % (ref 0–1)
Eosinophils Absolute: 0.2 10*3/uL (ref 0.0–0.7)
Eosinophils Relative: 3 % (ref 0–5)
LYMPHS ABS: 0.9 10*3/uL (ref 0.7–4.0)
Lymphocytes Relative: 12 % (ref 12–46)
MONO ABS: 0.7 10*3/uL (ref 0.1–1.0)
MONOS PCT: 10 % (ref 3–12)
Neutro Abs: 5.4 10*3/uL (ref 1.7–7.7)
Neutrophils Relative %: 75 % (ref 43–77)

## 2015-03-15 LAB — COMPREHENSIVE METABOLIC PANEL
ALT: 16 U/L (ref 14–54)
AST: 17 U/L (ref 15–41)
Albumin: 2.1 g/dL — ABNORMAL LOW (ref 3.5–5.0)
Alkaline Phosphatase: 102 U/L (ref 38–126)
Anion gap: 8 (ref 5–15)
BUN: 22 mg/dL — AB (ref 6–20)
CALCIUM: 8.7 mg/dL — AB (ref 8.9–10.3)
CHLORIDE: 109 mmol/L (ref 101–111)
CO2: 20 mmol/L — ABNORMAL LOW (ref 22–32)
CREATININE: 1.41 mg/dL — AB (ref 0.44–1.00)
GFR calc Af Amer: 41 mL/min — ABNORMAL LOW (ref >60)
GFR calc non Af Amer: 36 mL/min — ABNORMAL LOW (ref >60)
GLUCOSE: 91 mg/dL (ref 65–99)
Potassium: 4.4 mmol/L (ref 3.5–5.1)
SODIUM: 137 mmol/L (ref 135–145)
Total Bilirubin: 0.5 mg/dL (ref 0.3–1.2)
Total Protein: 6.1 g/dL — ABNORMAL LOW (ref 6.5–8.1)

## 2015-03-15 LAB — PREALBUMIN: Prealbumin: 5.5 mg/dL — ABNORMAL LOW (ref 18–38)

## 2015-03-15 LAB — CBC
HCT: 26.8 % — ABNORMAL LOW (ref 36.0–46.0)
HEMOGLOBIN: 8 g/dL — AB (ref 12.0–15.0)
MCH: 21.2 pg — AB (ref 26.0–34.0)
MCHC: 29.9 g/dL — AB (ref 30.0–36.0)
MCV: 71.1 fL — ABNORMAL LOW (ref 78.0–100.0)
Platelets: 352 10*3/uL (ref 150–400)
RBC: 3.77 MIL/uL — AB (ref 3.87–5.11)
RDW: 14.8 % (ref 11.5–15.5)
WBC: 7.2 10*3/uL (ref 4.0–10.5)

## 2015-03-15 LAB — GLUCOSE, CAPILLARY
GLUCOSE-CAPILLARY: 85 mg/dL (ref 65–99)
Glucose-Capillary: 75 mg/dL (ref 65–99)
Glucose-Capillary: 82 mg/dL (ref 65–99)

## 2015-03-15 LAB — PHOSPHORUS: Phosphorus: 3.2 mg/dL (ref 2.5–4.6)

## 2015-03-15 LAB — MAGNESIUM: MAGNESIUM: 1.7 mg/dL (ref 1.7–2.4)

## 2015-03-15 LAB — TRIGLYCERIDES: Triglycerides: 82 mg/dL (ref <150)

## 2015-03-15 MED ORDER — SODIUM CHLORIDE 0.9 % IJ SOLN
10.0000 mL | INTRAMUSCULAR | Status: DC | PRN
  Administered 2015-03-15 – 2015-03-25 (×8): 10 mL
  Filled 2015-03-15 (×8): qty 40

## 2015-03-15 MED ORDER — TRACE MINERALS CR-CU-MN-SE-ZN 10-1000-500-60 MCG/ML IV SOLN
INTRAVENOUS | Status: AC
  Administered 2015-03-15: 18:00:00 via INTRAVENOUS
  Filled 2015-03-15: qty 960

## 2015-03-15 MED ORDER — SODIUM CHLORIDE 0.9 % IJ SOLN
10.0000 mL | Freq: Two times a day (BID) | INTRAMUSCULAR | Status: DC
  Administered 2015-03-19 – 2015-03-20 (×3): 10 mL

## 2015-03-15 MED ORDER — FAT EMULSION 20 % IV EMUL
240.0000 mL | INTRAVENOUS | Status: AC
  Administered 2015-03-15: 240 mL via INTRAVENOUS
  Filled 2015-03-15: qty 250

## 2015-03-15 MED ORDER — SODIUM CHLORIDE 0.9 % IV SOLN
INTRAVENOUS | Status: DC
  Administered 2015-03-15: 19:00:00 via INTRAVENOUS

## 2015-03-15 MED ORDER — COLCHICINE 0.6 MG PO TABS
1.2000 mg | ORAL_TABLET | Freq: Once | ORAL | Status: AC
  Administered 2015-03-15: 1.2 mg via ORAL
  Filled 2015-03-15: qty 2

## 2015-03-15 MED ORDER — MAGNESIUM SULFATE 2 GM/50ML IV SOLN
2.0000 g | Freq: Once | INTRAVENOUS | Status: AC
Start: 2015-03-15 — End: 2015-03-15
  Administered 2015-03-15: 2 g via INTRAVENOUS
  Filled 2015-03-15: qty 50

## 2015-03-15 NOTE — Progress Notes (Signed)
Subjective: No complaints. Less pain  Objective: Vital signs in last 24 hours: Temp:  [98.7 F (37.1 C)-99.2 F (37.3 C)] 99.2 F (37.3 C) (07/03 0554) Pulse Rate:  [75-81] 79 (07/03 0554) Resp:  [16-18] 16 (07/03 0554) BP: (117-136)/(58-62) 136/62 mmHg (07/03 0554) SpO2:  [100 %] 100 % (07/03 0554) Last BM Date: 03/13/15  Intake/Output from previous day: 07/02 0701 - 07/03 0700 In: 3086.7 [I.V.:3036.7; IV Piggyback:50] Out: 2151 [Urine:1301; Emesis/NG output:400; Drains:450] Intake/Output this shift: Total I/O In: -  Out: 350 [Urine:350]  Awake, alert, nontoxic cta Soft, obese, ostomy bag over EC fistula, much less TTP and firmness around site.   Lab Results:   Recent Labs  03/14/15 0445 03/15/15 0545  WBC 10.6* 7.2  HGB 8.4* 8.0*  HCT 28.8* 26.8*  PLT 318 352   BMET  Recent Labs  03/14/15 0445 03/15/15 0545  NA 134* 137  K 4.3 4.4  CL 106 109  CO2 19* 20*  GLUCOSE 112* 91  BUN 32* 22*  CREATININE 1.64* 1.41*  CALCIUM 8.9 8.7*   PT/INR No results for input(s): LABPROT, INR in the last 72 hours. ABG No results for input(s): PHART, HCO3 in the last 72 hours.  Invalid input(s): PCO2, PO2  Studies/Results: Ct Abdomen Pelvis Wo Contrast  03/13/2015   CLINICAL DATA:  Abdominal pain. Decreased appetite. History of abdominal hernia.  EXAM: CT ABDOMEN AND PELVIS WITHOUT CONTRAST  TECHNIQUE: Multidetector CT imaging of the abdomen and pelvis was performed following the standard protocol without IV contrast.  COMPARISON:  CT abdomen pelvis 08/10/2012.  FINDINGS: The heart size is normal. Coronary artery calcifications are present. There is no significant pleural pericardial effusion. The lung bases are clear.  Liver and spleen are within normal limits. The stomach is mildly distended a small hiatal hernia is noted, similar to the prior scan. The duodenum and pancreas are within normal limits. The common bile duct is normal following cholecystectomy. And upper  pole right renal cyst is stable. No other focal renal lesions are present. The ureters are normal. Urinary bladder is unremarkable.  Sigmoid diverticula are present without focal inflammation to suggest diverticulitis. An anastomosis in the distal transverse segment is intact. The more proximal colon is unremarkable. There is contrast in the colon to the level of the transverse colon. The terminal ileum is within normal limits.  A ventral hernia repair is evident. There small bowel adhesions anteriorly. A fistula is noted on image 47 of series 2. There is contrast and air code extending across the peritoneal new. A complex subcutaneous collection contains gas and contrast. The collection measures 9.1 x 6.8 x 10.0 cm. This extends to just below the skin surface. The majority of the collection is just above the umbilicus.  The bone windows demonstrate a vacuum disc at L5-S1. There is fusion of anterior osteophytes the lower thoracic spine. No focal lytic or blastic lesions are present. The pelvis is unremarkable.  IMPRESSION: 1. 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 2. Large subcutaneous collection from the fistula of with probable abscess formation measuring 9.1 x 6.8 x 10.0 cm. But there is fluid and air with contrast extending from the bowel and to the collection. 3. Intact colonic anastomosis. 4. Colonic diverticulosis without diverticulitis. 5. Cholecystectomy, hysterectomy, and oophorectomy. 6. Degenerative changes of the lumbar spine at L5-S1. 7. Atherosclerosis including coronary artery disease. These results were called by telephone at the time of interpretation on  03/13/2015 at 3:14 pm to Dr. Dalia Heading , who verbally acknowledged these results.   Electronically Signed   By: San Morelle M.D.   On: 03/13/2015 15:15   Dg Chest Port 1 View  03/15/2015   CLINICAL DATA:  Central line placement  EXAM: PORTABLE CHEST - 1  VIEW  COMPARISON:  12/27/2014  FINDINGS: Right PICC line tip is in the SVC. NG tube enters the stomach. Heart is borderline in size. No confluent airspace opacities, effusions or edema. No acute bony abnormality.  IMPRESSION: Right PICC line tip in the SVC.  No acute cardiopulmonary disease.   Electronically Signed   By: Rolm Baptise M.D.   On: 03/15/2015 10:35    Anti-infectives: Anti-infectives    Start     Dose/Rate Route Frequency Ordered Stop   03/13/15 2000  ertapenem (INVANZ) 1 g in sodium chloride 0.9 % 50 mL IVPB     1 g 100 mL/hr over 30 Minutes Intravenous Every 24 hours 03/13/15 1829        Assessment/Plan: Principal Problem:   Enterocutaneous fistula Active Problems:   Gout   OSA (obstructive sleep apnea)   Essential hypertension   Coronary atherosclerosis   Chronic diastolic CHF (congestive heart failure)   Anemia, iron deficiency   Abdominal wall abscess   no fever, induration and cellulitis on abd wall seem to be improving. Wbc normalizing Cont bowel rest, TPN Will need wound care consult when they return Pt consult  Leighton Ruff. Redmond Pulling, MD, FACS General, Bariatric, & Minimally Invasive Surgery [facility], [place]   LOS: 2 days    Marissa [name] 03/15/2015

## 2015-03-15 NOTE — Progress Notes (Signed)
Initial Nutrition Assessment  DOCUMENTATION CODES:  Obesity unspecified  INTERVENTION:  TPN per Pharmacy Diet advancement per MD RD to continue to monitor  NUTRITION DIAGNOSIS:  Inadequate oral intake related to inability to eat as evidenced by NPO status.  GOAL:  Patient will meet greater than or equal to 90% of their needs  MONITOR:  Labs, Weight trends, Skin, I & O's, Other (Comment) (TPN tolerance)  REASON FOR ASSESSMENT:  Consult New TPN/TNA  ASSESSMENT: 74 year old female with history of CAD s/p multiple stents, chronic diastolic heart failure, diabetes mellitus with peripheral neuropathy, DVT no longer on anticoagulation secondary to recurrent diverticular and gastric bleeding, gout, obstructive sleep apnea, and multiple abdominal surgeries.She presented with abdominal swelling, erythema, pain, warmth, and anorexia.  Pt very lethargic and provided limited history during visit. Pt reports UBW is 200 lb. Wt stable per weight history documentation. Pt states she has had poor appetite x 3 weeks PTA.  Pt to begin TPN tonight. Pt currently NPO for bowel rest. PICC line placed this AM.  Nutrition focused physical exam shows no sign of depletion of muscle mass or body fat.  Plan per Pharmacy: At 1800 today:  Start Clinimix E 5/15 at 40 ml/hr.  20% fat emulsion at 10 ml/hr.  Plan to advance as tolerated to the goal rate.  TPN goal: Clinimix E 5/15 at a goal rate of 83 ml/hr + 20% fat emulsion at 83 ml/hr to provide: 100 g/day protein, 1910 Kcal/day.  Labs reviewed: Elevated BUN & Creatinine  Height:  Ht Readings from Last 1 Encounters:  03/14/15 5\' 4"  (1.626 m)    Weight:  Wt Readings from Last 1 Encounters:  03/14/15 222 lb 10.6 oz (101 kg)    Ideal Body Weight:  54.5 kg  Wt Readings from Last 10 Encounters:  03/14/15 222 lb 10.6 oz (101 kg)  03/05/15 219 lb (99.338 kg)  01/14/15 223 lb (101.152 kg)  01/07/15 216 lb (97.977 kg)  12/29/14 229 lb  11.5 oz (104.2 kg)  12/20/14 226 lb 13.7 oz (102.9 kg)  11/26/14 227 lb 4 oz (103.08 kg)  07/21/14 234 lb 6.4 oz (106.323 kg)  07/04/14 230 lb (104.327 kg)  07/01/14 230 lb (104.327 kg)    BMI:  Body mass index is 38.2 kg/(m^2).  Estimated Nutritional Needs:  Kcal:  1650-1850  Protein:  85-100g  Fluid:  1.7L/day    Skin:  Wound (see comment) (abdominal puncture)  Diet Order:  Diet NPO time specified TPN (CLINIMIX-E) Adult  EDUCATION NEEDS:  No education needs identified at this time   Intake/Output Summary (Last 24 hours) at 03/15/15 1242 Last data filed at 03/15/15 0800  Gross per 24 hour  Intake   2650 ml  Output   2351 ml  Net    299 ml    Last BM:  7/1  Clayton Bibles, MS, RD, LDN Pager: [phone] After Hours Pager: [phone]

## 2015-03-15 NOTE — Progress Notes (Addendum)
[facility] PROGRESS NOTE  Taunya Goral GTX:[id number] DOB: [birth year] DOA: 03/13/2015 PCP: [name], MD  Brief Summary  The patient is a 74 year old female with history of CAD s/p multiple stents, chronic diastolic heart failure, diabetes mellitus with peripheral neuropathy, DVT no longer on anticoagulation secondary to recurrent diverticular and gastric bleeding, gout, obstructive sleep apnea, and multiple abdominal surgeries. She presented with abdominal swelling, erythema, pain, warmth, and anorexia. CT scan of abdomen and pelvis demonstrated a large subcutaneous abscess 9.1 x 6.8 x 10 cm with a fistula that extended to the abdominal wall. The fistula appeared to originate at the small bowel.  Her abscess spontaneously burst the night of admission.  Surgery is attending and we are assisting with medical management.  Assessment/Plan  Enterocutaneous fistula, may have been caused by diverticulitis that eroded or by reopening of a bowel leak that had spontaneously closed previously. - General surgery managing - Currently nothing by mouth and starting TPN - Ertapenem day 3  Hypotension likely secondary to hypovolemia from poor oral intake, resolved. -  Afebrile and WBC trending down so doubt this was caused by sepsis -  Cortisol 14.5  CAD, HTN, and HLD, BP is elevated, having atypical chest pains - ECG NSR, no ischemic changes - Holding all oral medication - continue prn hydralazine  Chronic diastolic heart failure, last ECHO 07/2014 with preserved EF and mild concentric LVH - no swelling to suggest volume overload  Acute kidney injury likely due to dehydration and sepsis. Baseline creatinine is 1.4. Peak creatinine 1.9. Resolved with IVF. - UA: Concentrated, granular casts - Urine culture pending  Diet-controlled diabetes mellitus - q6h CBG with low dose SSI, but if A1c < 6.5 or if CBG persistently < 140, okay to d/c  - A1c pending  Recurrent diverticular  hemorrhage and antral gastritis with three admissions in the last three months for bleeding, last admission required two clips to stop gastric bleeding on 01/13/2015 -  Monitor for bleeding  Gout with acute flare, with knee and left heel pain.  Patient barely able to get out of bed today -  Avoid NSAIDS due to recent AKI -  Per surgery, okay to try colchicine and clamp NG for thirty minutes after pill given -  RN to please page or write note if she tolerates the colchicine and if so, I will continue -  May use low dose IV solumedrol sparingly if colchicine is ineffective or not tolerated -  Continue voltaren gel  Hx of DVT, not on anticoagulation - SCDs  OSA, stable, d/c cpap  Leukocytosis, likely related to underlying infection, resolved with IVF.  Microcytic anemia, likely due to iron deficiency and inflammation, trending down.  Is at risk for upper and lower GIB - IV iron tomorrow  Diet:  NPO Access:  PICC IVF: TPN Proph:  SCDs  Code Status: full Family Communication: patient alone Disposition Plan: pending improvement in blood pressure and plan for long-term nutrition.  Fistula repair per Surgery   Procedures:  CT a/p 7/2:  large subcutaneous abscess 9.1 x 6.8 x 10 cm with a fistula that extends to the abdominal wall. The fistula appears to originate at the small bowel. Her colonic anastomosis appears to be intact and she has colonic diverticulosis without diverticulitis  PICC line 7/3 to right arm  Antibiotics:  Ertapenem 7/1 >   HPI/Subjective:  Abdominal pain and pressure continue to improve.  She feels thirsty. Her whole body hurts she states.  Her joints ache and she  can barely get out of bed.   Objective: Filed Vitals:   03/14/15 1217 03/14/15 1400 03/14/15 2135 03/15/15 0554  BP: 117/61 131/58 123/60 136/62  Pulse: 75 80 81 79  Temp:  98.7 F (37.1 C) 99.2 F (37.3 C) 99.2 F (37.3 C)  TempSrc:  Oral Oral Oral  Resp:  16 18 16   Height:      Weight:       SpO2:  100% 100% 100%    Intake/Output Summary (Last 24 hours) at 03/15/15 1712 Last data filed at 03/15/15 1600  Gross per 24 hour  Intake   2450 ml  Output   2201 ml  Net    249 ml   Filed Weights   03/14/15 0226  Weight: 101 kg (222 lb 10.6 oz)    Exam:  Body mass index is 38.2 kg/(m^2).   General:  Obese female, No acute distress, NG tube and nose  HEENT:  NCAT, MMM  Cardiovascular:  RRR, nl S1, S2 no mrg, 2+ pulses, warm extremities  Respiratory:  CTAB, no increased WOB  Abdomen:   Hypoactive bowel sounds, soft, ostomy bag is full of light brown. No obvious erythema  MSK:   Normal tone and bulk, trace nonpitting edema bilateral ankles. She has minimal tenderness at the Achilles insertion of her left heel. She has mild warmth of both knees with TTP and without erythema.  Neuro:  Grossly intact  Data Reviewed: Basic Metabolic Panel:  Recent Labs Lab 03/13/15 1246 03/14/15 0445 03/14/15 1310 03/15/15 0545  NA 134* 134*  --  137  K 5.0 4.3  --  4.4  CL 103 106  --  109  CO2 18* 19*  --  20*  GLUCOSE 103* 112*  --  91  BUN 36* 32*  --  22*  CREATININE 1.93* 1.64*  --  1.41*  CALCIUM 9.6 8.9  --  8.7*  MG  --   --  1.8 1.7  PHOS  --   --  3.5 3.2   Liver Function Tests:  Recent Labs Lab 03/13/15 1246 03/15/15 0545  AST 35 17  ALT 25 16  ALKPHOS 134* 102  BILITOT 1.1 0.5  PROT 9.5* 6.1*  ALBUMIN 3.3* 2.1*   No results for input(s): LIPASE, AMYLASE in the last 168 hours. No results for input(s): AMMONIA in the last 168 hours. CBC:  Recent Labs Lab 03/13/15 1246 03/14/15 0445 03/15/15 0545  WBC 15.5* 10.6* 7.2  NEUTROABS 13.2*  --  5.4  HGB 11.3* 8.4* 8.0*  HCT 36.0 28.8* 26.8*  MCV 69.1* 69.9* 71.1*  PLT 366 318 352   Cardiac Enzymes: No results for input(s): CKTOTAL, CKMB, CKMBINDEX, TROPONINI in the last 168 hours. BNP (last 3 results)  Recent Labs  11/02/14 2340  BNP 109.2*    ProBNP (last 3 results) No results for  input(s): PROBNP in the last 8760 hours.  CBG:  Recent Labs Lab 03/14/15 1203 03/14/15 1823 03/14/15 2351 03/15/15 0714 03/15/15 1241  GLUCAP 97 93 88 75 82    Recent Results (from the past 240 hour(s))  Culture, Urine     Status: None   Collection Time: 03/13/15 12:48 PM  Result Value Ref Range Status   Specimen Description URINE, RANDOM  Final   Special Requests NONE  Final   Culture   Final    MULTIPLE SPECIES PRESENT, SUGGEST RECOLLECTION IF CLINICALLY INDICATED Performed at [facility]    Report Status 03/15/2015 FINAL  Final  Studies: Dg Chest Port 1 View  03/15/2015   CLINICAL DATA:  Central line placement  EXAM: PORTABLE CHEST - 1 VIEW  COMPARISON:  12/27/2014  FINDINGS: Right PICC line tip is in the SVC. NG tube enters the stomach. Heart is borderline in size. No confluent airspace opacities, effusions or edema. No acute bony abnormality.  IMPRESSION: Right PICC line tip in the SVC.  No acute cardiopulmonary disease.   Electronically Signed   By: Rolm Baptise M.D.   On: 03/15/2015 10:35    Scheduled Meds: . colchicine  1.2 mg Oral Once  . diclofenac sodium  4 g Topical QID  . ertapenem  1 g Intravenous Q24H  . insulin aspart  0-9 Units Subcutaneous 4 times per day  . pantoprazole (PROTONIX) IV  40 mg Intravenous QHS  . sodium chloride  10-40 mL Intracatheter Q12H   Continuous Infusions: . sodium chloride    . dextrose 5 % and 0.45 % NaCl with KCl 20 mEq/L 100 mL/hr at 03/15/15 1149  . Marland KitchenTPN (CLINIMIX-E) Adult     And  . fat emulsion      Principal Problem:   Enterocutaneous fistula Active Problems:   Gout   OSA (obstructive sleep apnea)   Essential hypertension   Coronary atherosclerosis   Chronic diastolic CHF (congestive heart failure)   Anemia, iron deficiency   Abdominal wall abscess    Time spent: 30 min    Christiano Blandon, [facility] Pager [phone]. If 7PM-7AM, please contact night-coverage at [email or web address], password  [facility] 03/15/2015, 5:12 PM  LOS: 2 days

## 2015-03-15 NOTE — Progress Notes (Signed)
PARENTERAL NUTRITION CONSULT NOTE - FOLLOW UP  Pharmacy Consult for TPN Indication: bowel rest, abdominal abscess  No Known Allergies  Patient Measurements: Height: 5' 4" (162.6 cm) Weight: 222 lb 10.6 oz (101 kg) IBW/kg (Calculated) : 54.7 Adjusted Body Weight: 70 kg Usual Weight: 101 kg  Vital Signs: Temp: 99.2 F (37.3 C) (07/03 0554) Temp Source: Oral (07/03 0554) BP: 136/62 mmHg (07/03 0554) Pulse Rate: 79 (07/03 0554) Intake/Output from previous day: 07/02 0701 - 07/03 0700 In: 3086.7 [I.V.:3036.7; IV Piggyback:50] Out: 2151 [Urine:1301; Emesis/NG output:400; Drains:450] Intake/Output from this shift: Total I/O In: -  Out: 350 [Urine:350]  Labs:  Recent Labs  03/13/15 1246 03/14/15 0445 03/15/15 0545  WBC 15.5* 10.6* 7.2  HGB 11.3* 8.4* 8.0*  HCT 36.0 28.8* 26.8*  PLT 366 318 352     Recent Labs  03/13/15 1246 03/14/15 0445 03/14/15 1310 03/15/15 0545 03/15/15 0546  NA 134* 134*  --  137  --   K 5.0 4.3  --  4.4  --   CL 103 106  --  109  --   CO2 18* 19*  --  20*  --   GLUCOSE 103* 112*  --  91  --   BUN 36* 32*  --  22*  --   CREATININE 1.93* 1.64*  --  1.41*  --   CALCIUM 9.6 8.9  --  8.7*  --   MG  --   --  1.8 1.7  --   PHOS  --   --  3.5 3.2  --   PROT 9.5*  --   --  6.1*  --   ALBUMIN 3.3*  --   --  2.1*  --   AST 35  --   --  17  --   ALT 25  --   --  16  --   ALKPHOS 134*  --   --  102  --   BILITOT 1.1  --   --  0.5  --   PREALBUMIN  --   --   --   --  5.5*  TRIG  --   --   --   --  82   Estimated Creatinine Clearance: 40.5 mL/min (by C-G formula based on Cr of 1.41).    Recent Labs  03/14/15 1823 03/14/15 2351 03/15/15 0714  GLUCAP 93 88 75    Insulin Requirements: sensitive scale Novolog q6h, 0 units given yesterday  Current Nutrition: NPO  IVF: D5 & 1/2NS with 20 mEq KCl at 100 ml/hr  Central access: 7/3 TPN start date:  7/3  ASSESSMENT  HPI: admit 7/1 with abdominal pain, per CT: mesh erosion (previous hernia repair) into small bowel with abscess and fistula. No surgery today, begin TPN. Current NG suction for EC fistula.  Significant events:  7/2: unable to place PICC; TPN postponed  Today, 03/15/2015:   Glucose - CBGs low/normal (not currently receiving any nutrition)  Electrolytes - Mg borderline low; o/w wnl  Renal - SCr elevated on admission, now trending down; CrCl 41 CG/N  LFTs - alb low, otherwise normal.  Alk phos high previously, now improved  TGs - 82  Prealbumin - 5.5 (7/3)  NUTRITIONAL GOALS  RD recs: await recommendations Clinimix E 5/15 at a goal rate of 83 ml/hr + 20% fat emulsion at 83 ml/hr to provide: 100 g/day protein, 1910 Kcal/day.  PLAN  At 1800 today:  Start Clinimix E 5/15 at 40 ml/hr.  20% fat emulsion   at 10 ml/hr.  Plan to advance as tolerated to the goal rate.  TPN to contain standard multivitamins and trace elements.  Mg 2g IV x 1  Change IVF to NS at 60 ml/hr  Continue SSI as ordered  TPN lab panels on Mondays & Thursdays.  F/u daily.   , PharmD, BCPS Pager: [phone] 03/15/2015, 12:28 PM  

## 2015-03-15 NOTE — Progress Notes (Signed)
Peripherally Inserted Central Catheter/Midline Placement  The IV Nurse has discussed with the patient and/or persons authorized to consent for the patient, the purpose of this procedure and the potential benefits and risks involved with this procedure.  The benefits include less needle sticks, lab draws from the catheter and patient may be discharged home with the catheter.  Risks include, but not limited to, infection, bleeding, blood clot (thrombus formation), and puncture of an artery; nerve damage and irregular heat beat.  Alternatives to this procedure were also discussed.  PICC/Midline Placement Documentation  PICC / Midline Double Lumen 85/88/50 PICC Right Basilic 44 cm 3 cm (Active)  Indication for Insertion or Continuance of Line Administration of hyperosmolar/irritating solutions (i.e. TPN, Vancomycin, etc.) 03/15/2015  9:10 AM  Exposed Catheter (cm) 3 cm 03/15/2015  9:10 AM  Site Assessment Clean;Dry;Intact 03/15/2015  9:10 AM  Lumen #1 Status Flushed;Saline locked;Blood return noted 03/15/2015  9:10 AM  Lumen #2 Status Flushed;Saline locked;Blood return noted 03/15/2015  9:10 AM  Dressing Type Transparent 03/15/2015  9:10 AM  Dressing Change Due 03/22/15 03/15/2015  9:10 AM       Gordan Payment 03/15/2015, 9:11 AM

## 2015-03-16 DIAGNOSIS — M10062 Idiopathic gout, left knee: Secondary | ICD-10-CM

## 2015-03-16 LAB — MAGNESIUM: Magnesium: 2.2 mg/dL (ref 1.7–2.4)

## 2015-03-16 LAB — COMPREHENSIVE METABOLIC PANEL
ALBUMIN: 2.4 g/dL — AB (ref 3.5–5.0)
ALT: 14 U/L (ref 14–54)
AST: 19 U/L (ref 15–41)
Alkaline Phosphatase: 96 U/L (ref 38–126)
Anion gap: 8 (ref 5–15)
BUN: 17 mg/dL (ref 6–20)
CO2: 22 mmol/L (ref 22–32)
Calcium: 8.9 mg/dL (ref 8.9–10.3)
Chloride: 107 mmol/L (ref 101–111)
Creatinine, Ser: 1.32 mg/dL — ABNORMAL HIGH (ref 0.44–1.00)
GFR calc Af Amer: 45 mL/min — ABNORMAL LOW (ref >60)
GFR, EST NON AFRICAN AMERICAN: 39 mL/min — AB (ref >60)
GLUCOSE: 103 mg/dL — AB (ref 65–99)
Potassium: 4.3 mmol/L (ref 3.5–5.1)
Sodium: 137 mmol/L (ref 135–145)
Total Bilirubin: 0.6 mg/dL (ref 0.3–1.2)
Total Protein: 7.1 g/dL (ref 6.5–8.1)

## 2015-03-16 LAB — GLUCOSE, CAPILLARY
Glucose-Capillary: 109 mg/dL — ABNORMAL HIGH (ref 65–99)
Glucose-Capillary: 109 mg/dL — ABNORMAL HIGH (ref 65–99)
Glucose-Capillary: 110 mg/dL — ABNORMAL HIGH (ref 65–99)
Glucose-Capillary: 147 mg/dL — ABNORMAL HIGH (ref 65–99)
Glucose-Capillary: 165 mg/dL — ABNORMAL HIGH (ref 65–99)

## 2015-03-16 LAB — CBC
HEMATOCRIT: 26.8 % — AB (ref 36.0–46.0)
Hemoglobin: 8 g/dL — ABNORMAL LOW (ref 12.0–15.0)
MCH: 21 pg — ABNORMAL LOW (ref 26.0–34.0)
MCHC: 29.9 g/dL — AB (ref 30.0–36.0)
MCV: 70.3 fL — ABNORMAL LOW (ref 78.0–100.0)
Platelets: 304 10*3/uL (ref 150–400)
RBC: 3.81 MIL/uL — ABNORMAL LOW (ref 3.87–5.11)
RDW: 14.8 % (ref 11.5–15.5)
WBC: 9.1 10*3/uL (ref 4.0–10.5)

## 2015-03-16 LAB — PHOSPHORUS: PHOSPHORUS: 2.9 mg/dL (ref 2.5–4.6)

## 2015-03-16 MED ORDER — SODIUM CHLORIDE 0.9 % IV SOLN
INTRAVENOUS | Status: AC
  Administered 2015-03-16 – 2015-03-17 (×2): via INTRAVENOUS

## 2015-03-16 MED ORDER — COLCHICINE 0.6 MG PO TABS
0.6000 mg | ORAL_TABLET | Freq: Two times a day (BID) | ORAL | Status: DC
  Administered 2015-03-16 – 2015-03-27 (×21): 0.6 mg via ORAL
  Filled 2015-03-16 (×24): qty 1

## 2015-03-16 MED ORDER — FAT EMULSION 20 % IV EMUL
240.0000 mL | INTRAVENOUS | Status: AC
  Administered 2015-03-16: 240 mL via INTRAVENOUS
  Filled 2015-03-16: qty 250

## 2015-03-16 MED ORDER — AMLODIPINE BESYLATE 5 MG PO TABS
5.0000 mg | ORAL_TABLET | Freq: Every day | ORAL | Status: DC
Start: 2015-03-16 — End: 2015-03-17
  Administered 2015-03-16: 5 mg via ORAL
  Filled 2015-03-16 (×2): qty 1

## 2015-03-16 MED ORDER — SODIUM CHLORIDE 0.9 % IV SOLN
510.0000 mg | Freq: Once | INTRAVENOUS | Status: AC
  Administered 2015-03-16: 510 mg via INTRAVENOUS
  Filled 2015-03-16: qty 17

## 2015-03-16 MED ORDER — COLCHICINE 0.6 MG PO TABS
0.6000 mg | ORAL_TABLET | Freq: Every day | ORAL | Status: DC
  Administered 2015-03-16: 0.6 mg via ORAL
  Filled 2015-03-16: qty 1

## 2015-03-16 MED ORDER — METHYLPREDNISOLONE SODIUM SUCC 40 MG IJ SOLR
40.0000 mg | Freq: Every day | INTRAMUSCULAR | Status: DC
  Administered 2015-03-16 – 2015-03-17 (×2): 40 mg via INTRAVENOUS
  Filled 2015-03-16 (×2): qty 1

## 2015-03-16 MED ORDER — METHYLPREDNISOLONE SODIUM SUCC 40 MG IJ SOLR
40.0000 mg | Freq: Every day | INTRAMUSCULAR | Status: DC
Start: 2015-03-16 — End: 2015-03-16
  Filled 2015-03-16: qty 1

## 2015-03-16 MED ORDER — TRACE MINERALS CR-CU-MN-SE-ZN 10-1000-500-60 MCG/ML IV SOLN
INTRAVENOUS | Status: AC
  Administered 2015-03-16: 18:00:00 via INTRAVENOUS
  Filled 2015-03-16: qty 1560

## 2015-03-16 NOTE — Progress Notes (Signed)
PARENTERAL NUTRITION CONSULT NOTE - FOLLOW UP  Pharmacy Consult for TPN Indication: bowel rest, abdominal abscess  No Known Allergies  Patient Measurements: Height: _0  (162.6 cm) Weight: 222 lb 10.6 oz (101 kg) IBW/kg (Calculated) : 54.7 Adjusted Body Weight: 70 kg Usual Weight: 101 kg  Vital Signs: Temp: 97.7 F (36.5 C) (07/04 0630) Temp Source: Oral (07/04 0630) BP: 159/69 mmHg (07/04 0630) Pulse Rate: 83 (07/04 0630) Intake/Output from previous day: 07/03 0701 - 07/04 0700 In: 2504.5 [I.V.:1876; IV Piggyback:50; TPN:578.5] Out: 1650 [Urine:1300; Emesis/NG output:250; Drains:100] Intake/Output from this shift:    Labs:  Recent Labs  03/14/15 0445 03/15/15 0545 03/16/15 0500  WBC 10.6* 7.2 9.1  HGB 8.4* 8.0* 8.0*  HCT 28.8* 26.8* 26.8*  PLT 318 352 304     Recent Labs  03/13/15 1246 03/14/15 0445 03/14/15 1310 03/15/15 0545 03/15/15 0546 03/16/15 0500  NA 134* 134*  --  137  --  137  K 5.0 4.3  --  4.4  --  4.3  CL 103 106  --  109  --  107  CO2 18* 19*  --  20*  --  22  GLUCOSE 103* 112*  --  91  --  103*  BUN 36* 32*  --  22*  --  17  CREATININE 1.93* 1.64*  --  1.41*  --  1.32*  CALCIUM 9.6 8.9  --  8.7*  --  8.9  MG  --   --  1.8 1.7  --  2.2  PHOS  --   --  3.5 3.2  --  2.9  PROT 9.5*  --   --  6.1*  --  7.1  ALBUMIN 3.3*  --   --  2.1*  --  2.4*  AST 35  --   --  17  --  19  ALT 25  --   --  16  --  14  ALKPHOS 134*  --   --  102  --  96  BILITOT 1.1  --   --  0.5  --  0.6  PREALBUMIN  --   --   --   --  5.5*  --   TRIG  --   --   --   --  82  --    Estimated Creatinine Clearance: 43.2 mL/min (by C-G formula based on Cr of 1.32).    Recent Labs  03/15/15 1815 03/16/15 0049 03/16/15 0621  GLUCAP 85 109* 110*    Insulin Requirements: sensitive scale Novolog q6h, 0 units given yesterday  Current Nutrition: NPO  IVF: NS @ 60 ml/hr  Central access: 7/3 TPN start date:  7/3  ASSESSMENT  HPI: admit 7/1 with abdominal pain, per CT: mesh erosion (previous hernia repair) into small bowel with abscess and fistula. No surgery today, begin TPN. Current NG suction for EC fistula.  Significant events:  7/2: unable to place PICC; TPN postponed 7/3: TPN started  Today, 03/16/2015:   Glucose - CBGs low/normal   Electrolytes -  Mag improved to WNL after replenishment, all others WNL  Renal - SCr elevated on admission, now trending down; CrCl 43 CG/N  LFTs - alb low, otherwise normal.  Alk phos high previously, now improved  TGs - 82  Prealbumin - 5.5 (7/3)  NUTRITIONAL GOALS  RD recs: await recommendations Clinimix E 5/15 at a goal rate of 83 ml/hr + 20% fat emulsion at 83 ml/hr to provide: 100 g/day protein, 1910 Kcal/day.  PLAN  At 1800 today:  Increase Clinimix E 5/15 to 65 ml/hr.  20% fat emulsion at 10 ml/hr.  Plan to advance as tolerated to the goal rate.  TPN to contain standard multivitamins and trace elements.  Decrease  NS to 45 ml/hr  Continue SSI as ordered  Bmet with am labs  TPN lab panels on Mondays & Thursdays.  F/u daily.  Dolly Rias RPh 03/16/2015, 8:24 AM Pager [phone]

## 2015-03-16 NOTE — Progress Notes (Signed)
[facility] PROGRESS NOTE  Marissa [name] XLK:[id number] DOB: 1940-11-30 DOA: 03/13/2015 PCP: [name], MD  Brief Summary  The patient is a 74 year old female with history of CAD s/p multiple stents, chronic diastolic heart failure, diabetes mellitus with peripheral neuropathy, DVT no longer on anticoagulation secondary to recurrent diverticular and gastric bleeding, gout, obstructive sleep apnea, and multiple abdominal surgeries. She presented with abdominal swelling, erythema, pain, warmth, and anorexia. CT scan of abdomen and pelvis demonstrated a large subcutaneous abscess 9.1 x 6.8 x 10 cm with a fistula that extended to the abdominal wall. The fistula appeared to originate at the small bowel.  Her abscess spontaneously burst the night of admission.  Surgery is attending and we are assisting with medical management.  Assessment/Plan  Enterocutaneous fistula, may have been caused by diverticulitis that eroded or by reopening of a bowel leak that had spontaneously closed previously. - General surgery managing - Currently nothing by mouth and starting TPN - Ertapenem day 4  Hypotension likely secondary to hypovolemia from poor oral intake, resolved. -  Cortisol 14.5  CAD, HTN, and HLD, BP is elevated, having atypical chest pains - ECG NSR, no ischemic changes - resume once daily norvasc (can be given with colchicine on future days) - continue prn hydralazine  Chronic diastolic heart failure, last ECHO 07/2014 with preserved EF and mild concentric LVH - no swelling to suggest volume overload  Acute kidney injury likely due to dehydration and sepsis. Baseline creatinine is 1.4. Peak creatinine 1.9. Resolved with IVF. - UA: Concentrated, granular casts - Urine culture mixed flora  Diet-controlled diabetes mellitus - q6h CBG with low dose SSI, but if A1c < 6.5 or if CBG persistently < 140, okay to d/c  - A1c pending  Recurrent diverticular hemorrhage and antral  gastritis with three admissions in the last three months for bleeding, last admission required two clips to stop gastric bleeding on 01/13/2015 -  Monitor for bleeding  Gout with acute flare, with knee and left heel pain.  Patient unable to get out of bed today -  Avoid NSAIDS due to recent AKI -  Start colchicine BID -  Start solumedrol 40mg  IV daily -  Continue voltaren gel  Hx of DVT, not on anticoagulation.  No recent bleeding - start heparin  OSA, stable, d/c cpap  Leukocytosis, likely related to underlying infection, resolved with IVF.  Microcytic anemia, likely due to iron deficiency and inflammation, stable at 8mg /dl.  - IV iron today  Diet:  NPO Access:  PICC IVF: TPN Proph:  SCDs  Code Status: full Family Communication: patient alone Disposition Plan:   tx for gout and PT/OT.  Will be NPO with TPN    Procedures:  CT a/p 7/2:  large subcutaneous abscess 9.1 x 6.8 x 10 cm with a fistula that extends to the abdominal wall. The fistula appears to originate at the small bowel. Her colonic anastomosis appears to be intact and she has colonic diverticulosis without diverticulitis  PICC line 7/3 to right arm  Antibiotics:  Ertapenem 7/1 >   HPI/Subjective:  all of her joints hurt.  Denies SOB, cough, nausea.  Abdomen feels much better overall and she recently had an ostomy bag placed by wound care.  Sleepy.   Objective: Filed Vitals:   03/15/15 0554 03/15/15 1400 03/15/15 2226 03/16/15 0630  BP: 136/62 129/69 144/79 159/69  Pulse: 79 78 88 83  Temp: 99.2 F (37.3 C) 98.3 F (36.8 C) 97.8 F (36.6 C) 97.7 F (36.5  C)  TempSrc: Oral Oral Oral Oral  Resp: 16 16 18 18   Height:      Weight:      SpO2: 100% 100% 100% 100%    Intake/Output Summary (Last 24 hours) at 03/16/15 1203 Last data filed at 03/16/15 1037  Gross per 24 hour  Intake 2119.5 ml  Output   1300 ml  Net  819.5 ml   Filed Weights   03/14/15 0226  Weight: 101 kg (222 lb 10.6 oz)     Exam:  Body mass index is 38.2 kg/(m^2).   General:  Obese female, No acute distress, NG tube with bilious material  HEENT:  NCAT, MMM  Cardiovascular:  RRR, nl S1, S2 no mrg, 2+ pulses, warm extremities  Respiratory:  CTAB, no increased WOB  Abdomen:   NABS, soft, ostomy bag is full of light brown. No obvious erythema  MSK:   Warmth, pain and swelling of bilateral ankles and knees  Neuro:  Grossly intact  Data Reviewed: Basic Metabolic Panel:  Recent Labs Lab 03/13/15 1246 03/14/15 0445 03/14/15 1310 03/15/15 0545 03/16/15 0500  NA 134* 134*  --  137 137  K 5.0 4.3  --  4.4 4.3  CL 103 106  --  109 107  CO2 18* 19*  --  20* 22  GLUCOSE 103* 112*  --  91 103*  BUN 36* 32*  --  22* 17  CREATININE 1.93* 1.64*  --  1.41* 1.32*  CALCIUM 9.6 8.9  --  8.7* 8.9  MG  --   --  1.8 1.7 2.2  PHOS  --   --  3.5 3.2 2.9   Liver Function Tests:  Recent Labs Lab 03/13/15 1246 03/15/15 0545 03/16/15 0500  AST 35 17 19  ALT 25 16 14   ALKPHOS 134* 102 96  BILITOT 1.1 0.5 0.6  PROT 9.5* 6.1* 7.1  ALBUMIN 3.3* 2.1* 2.4*   No results for input(s): LIPASE, AMYLASE in the last 168 hours. No results for input(s): AMMONIA in the last 168 hours. CBC:  Recent Labs Lab 03/13/15 1246 03/14/15 0445 03/15/15 0545 03/16/15 0500  WBC 15.5* 10.6* 7.2 9.1  NEUTROABS 13.2*  --  5.4  --   HGB 11.3* 8.4* 8.0* 8.0*  HCT 36.0 28.8* 26.8* 26.8*  MCV 69.1* 69.9* 71.1* 70.3*  PLT 366 318 352 304   Cardiac Enzymes: No results for input(s): CKTOTAL, CKMB, CKMBINDEX, TROPONINI in the last 168 hours. BNP (last 3 results)  Recent Labs  11/02/14 2340  BNP 109.2*    ProBNP (last 3 results) No results for input(s): PROBNP in the last 8760 hours.  CBG:  Recent Labs Lab 03/15/15 0714 03/15/15 1241 03/15/15 1815 03/16/15 0049 03/16/15 0621  GLUCAP 75 82 85 109* 110*    Recent Results (from the past 240 hour(s))  Culture, Urine     Status: None   Collection Time:  03/13/15 12:48 PM  Result Value Ref Range Status   Specimen Description URINE, RANDOM  Final   Special Requests NONE  Final   Culture   Final    MULTIPLE SPECIES PRESENT, SUGGEST RECOLLECTION IF CLINICALLY INDICATED Performed at [facility]    Report Status 03/15/2015 FINAL  Final     Studies: Dg Chest Port 1 View  03/15/2015   CLINICAL DATA:  Central line placement  EXAM: PORTABLE CHEST - 1 VIEW  COMPARISON:  12/27/2014  FINDINGS: Right PICC line tip is in the SVC. NG tube enters the stomach. Heart  is borderline in size. No confluent airspace opacities, effusions or edema. No acute bony abnormality.  IMPRESSION: Right PICC line tip in the SVC.  No acute cardiopulmonary disease.   Electronically Signed   By: Rolm Baptise M.D.   On: 03/15/2015 10:35    Scheduled Meds: . colchicine  0.6 mg Oral Daily  . diclofenac sodium  4 g Topical QID  . ertapenem  1 g Intravenous Q24H  . insulin aspart  0-9 Units Subcutaneous 4 times per day  . methylPREDNISolone (SOLU-MEDROL) injection  40 mg Intravenous Daily  . pantoprazole (PROTONIX) IV  40 mg Intravenous QHS  . sodium chloride  10-40 mL Intracatheter Q12H   Continuous Infusions: . sodium chloride    . Marland KitchenTPN (CLINIMIX-E) Adult 40 mL/hr at 03/15/15 1826   And  . fat emulsion 240 mL (03/15/15 1825)  . Marland KitchenTPN (CLINIMIX-E) Adult     And  . fat emulsion      Principal Problem:   Enterocutaneous fistula Active Problems:   Gout   OSA (obstructive sleep apnea)   Essential hypertension   Coronary atherosclerosis   Chronic diastolic CHF (congestive heart failure)   Anemia, iron deficiency   Abdominal wall abscess    Time spent: 30 min    Lizania Bouchard, [facility] Pager [phone]. If 7PM-7AM, please contact night-coverage at [email or web address], password [facility] 03/16/2015, 12:03 PM  LOS: 3 days

## 2015-03-16 NOTE — Evaluation (Signed)
Physical Therapy Evaluation Patient Details Name: Marissa [name] MRN: [id number] DOB: May 29, 1941 Today's Date: 03/16/2015   History of Present Illness  74 year old female with history of CAD s/p multiple stents, chronic diastolic heart failure, diabetes mellitus with peripheral neuropathy, DVT no longer on anticoagulation secondary to recurrent diverticular and gastric bleeding, gout, obstructive sleep apnea, and multiple abdominal surgeries  Pt admitted for Enterocutaneous fistula and also with gout flare.  Clinical Impression  Pt admitted with above diagnosis. Pt currently with functional limitations due to the deficits listed below (see PT Problem List).  Pt will benefit from skilled PT to increase their independence and safety with mobility to allow discharge to the venue listed below.  Pt with limited mobility today due to fatigue and gout pain.  Anticipate d/c to home once gout pain relieved, however will keep recommendations updated based on progress.     Follow Up Recommendations Home health PT;Supervision/Assistance - 24 hour    Equipment Recommendations  None recommended by PT    Recommendations for Other Services       Precautions / Restrictions Precautions Precautions: Fall Precaution Comments: ostomy, NG tube      Mobility  Bed Mobility Overal bed mobility: Needs Assistance;+2 for physical assistance Bed Mobility: Supine to Sit;Sit to Supine     Supine to sit: +2 for physical assistance;Max assist Sit to supine: +2 for physical assistance;Max assist   General bed mobility comments: verbal cues for technique, pt attempting however too painful so assisted with mobility, pt reports feeling very weak and tired, did not wish to progress mobility further so assisted back to supine  Transfers                    Ambulation/Gait                Stairs            Wheelchair Mobility    Modified Rankin (Stroke Patients Only)       Balance                                              Pertinent Vitals/Pain Pain Assessment: 0-10 Pain Score: 10-Worst pain ever Pain Location: feet and knees Pain Descriptors / Indicators: Tender;Moaning;Grimacing Pain Intervention(s): Repositioned;Other (comment);Limited activity within patient's tolerance;Monitored during session (gout pain, MD aware)    Home Living Family/patient expects to be discharged to:: Private residence Living Arrangements: Children Available Help at Discharge: Family;Available PRN/intermittently Type of Home: House Home Access: Stairs to enter Entrance Stairs-Rails: None Entrance Stairs-Number of Steps: 1 Home Layout: One level Home Equipment: Cane - single point;Walker - 2 wheels;Walker - 4 wheels;Bedside commode;Wheelchair - manual;Shower seat - built in Additional Comments: Pt states that family is there most of time.      Prior Function Level of Independence: Independent with assistive device(s)   Gait / Transfers Assistance Needed: typically cane but also uses rollator and w/c (per previous admission)           Hand Dominance        Extremity/Trunk Assessment   Upper Extremity Assessment: Generalized weakness           Lower Extremity Assessment: Generalized weakness (per pt report and observation, unable to test due to gout pain)         Communication   Communication: No difficulties  Cognition Arousal/Alertness: Awake/alert Behavior  During Therapy: Flat affect Overall Cognitive Status: No family/caregiver present to determine baseline cognitive functioning                      General Comments      Exercises        Assessment/Plan    PT Assessment Patient needs continued PT services  PT Diagnosis Difficulty walking   PT Problem List Decreased strength;Decreased activity tolerance;Decreased balance;Decreased mobility;Pain  PT Treatment Interventions DME instruction;Gait training;Functional mobility  training;Patient/family education;Therapeutic activities;Therapeutic exercise   PT Goals (Current goals can be found in the Care Plan section) Acute Rehab PT Goals PT Goal Formulation: With patient Time For Goal Achievement: 03/23/15 Potential to Achieve Goals: Good    Frequency Min 3X/week   Barriers to discharge        Co-evaluation               End of Session   Activity Tolerance: Patient limited by pain;Patient limited by fatigue Patient left: in bed;with call bell/phone within reach           Time: 0915-0938 PT Time Calculation (min) (ACUTE ONLY): 23 min   Charges:   PT Evaluation $Initial PT Evaluation Tier I: 1 Procedure     PT G Codes:        Aliesha Dolata,KATHrine E 03/16/2015, 12:40 PM Carmelia Bake, PT, DPT 03/16/2015 Pager: [phone]

## 2015-03-16 NOTE — Consult Note (Signed)
WOC ostomy consult note Stoma type/location: Enterocutaneous fistula spontaneously formed on LUQ. CCS is managing care with assistance medically from hospitalist internal medicine service. Stomal assessment/size: Fistula measures 3/4 inches round with closed wound edges. It is flush with abdomen. Peristomal assessment: macerated, intact with friable edge circumferential to fistula Treatment options for stomal/peristomal skin: cleanse with warm water, pat gently dry. Output tan, malodorous effluent Ostomy pouching: 1pc.[place]akin fistula pouch   Education provided: None today. Patient with NGT, eyes closed throughout visit and pouch application.  When asked how she was doing this morning, patient states, "not good" and " I am so tired". Enrolled patient in [place] program: No WOC Nurse POC will be to change fistula pouching system Marijo File #[id number]) twice weekly on Mondays and Thursdays and PRN to ensure secure seal without undermining effluent/leakage. Nursing orders are provided for bedside staff to perform in the absence of a [place]. [place] nursing team will follow, and will remain available to this patient, the nursing, surgical and medical teams.  Thanks, Maudie Flakes, MSN, RN, [place], [place], [place] [phone])

## 2015-03-16 NOTE — Progress Notes (Signed)
Patient ID: Marissa [name], female   DOB: 1940/10/17, 74 y.o.   MRN: [id number] Marissa [name] Surgery Progress Note:   * No surgery found *  Subjective: Mental status is mildly depressed-sleepy Objective: Vital signs in last 24 hours: Temp:  [97.7 F (36.5 C)-98.3 F (36.8 C)] 97.7 F (36.5 C) (07/04 0630) Pulse Rate:  [78-88] 83 (07/04 0630) Resp:  [16-18] 18 (07/04 0630) BP: (129-159)/(69-79) 159/69 mmHg (07/04 0630) SpO2:  [100 %] 100 % (07/04 0630)  Intake/Output from previous day: 07/03 0701 - 07/04 0700 In: 2504.5 [I.V.:1876; IV Piggyback:50; TPN:578.5] Out: 1650 [Urine:1300; Emesis/NG output:250; Drains:100] Intake/Output this shift: Total I/O In: -  Out: 200 [Urine:200]  Physical Exam: Work of breathing is not labored.  Ostomy appliance changed with Marissa  [name].  Steriods per Dr. Sheran Fava for gout.  TNA per pic line  Lab Results:  Results for orders placed or performed during the hospital encounter of 03/13/15 (from the past 48 hour(s))  Glucose, capillary     Status: None   Collection Time: 03/14/15 12:03 PM  Result Value Ref Range   Glucose-Capillary 97 65 - 99 mg/dL  Magnesium     Status: None   Collection Time: 03/14/15  1:10 PM  Result Value Ref Range   Magnesium 1.8 1.7 - 2.4 mg/dL  Phosphorus     Status: None   Collection Time: 03/14/15  1:10 PM  Result Value Ref Range   Phosphorus 3.5 2.5 - 4.6 mg/dL  Cortisol     Status: None   Collection Time: 03/14/15  1:10 PM  Result Value Ref Range   Cortisol, Plasma 14.5 ug/dL    Comment: (NOTE) AM    6.7 - 22.6 ug/dL PM   <10.0       ug/dL Performed at [name]   Glucose, capillary     Status: None   Collection Time: 03/14/15  6:23 PM  Result Value Ref Range   Glucose-Capillary 93 65 - 99 mg/dL  Glucose, capillary     Status: None   Collection Time: 03/14/15 11:51 PM  Result Value Ref Range   Glucose-Capillary 88 65 - 99 mg/dL  Comprehensive metabolic panel     Status: Abnormal   Collection  Time: 03/15/15  5:45 AM  Result Value Ref Range   Sodium 137 135 - 145 mmol/L   Potassium 4.4 3.5 - 5.1 mmol/L   Chloride 109 101 - 111 mmol/L   CO2 20 (L) 22 - 32 mmol/L   Glucose, Bld 91 65 - 99 mg/dL   BUN 22 (H) 6 - 20 mg/dL   Creatinine, Ser 1.41 (H) 0.44 - 1.00 mg/dL   Calcium 8.7 (L) 8.9 - 10.3 mg/dL   Total Protein 6.1 (L) 6.5 - 8.1 g/dL   Albumin 2.1 (L) 3.5 - 5.0 g/dL   AST 17 15 - 41 U/L   ALT 16 14 - 54 U/L   Alkaline Phosphatase 102 38 - 126 U/L   Total Bilirubin 0.5 0.3 - 1.2 mg/dL   GFR calc non Af Amer 36 (L) >60 mL/min   GFR calc Af Amer 41 (L) >60 mL/min    Comment: (NOTE) The eGFR has been calculated using the CKD EPI equation. This calculation has not been validated in all clinical situations. eGFR's persistently <60 mL/min signify possible Chronic Kidney Disease.    Anion gap 8 5 - 15  Magnesium     Status: None   Collection Time: 03/15/15  5:45 AM  Result Value Ref  Range   Magnesium 1.7 1.7 - 2.4 mg/dL  Phosphorus     Status: None   Collection Time: 03/15/15  5:45 AM  Result Value Ref Range   Phosphorus 3.2 2.5 - 4.6 mg/dL  CBC     Status: Abnormal   Collection Time: 03/15/15  5:45 AM  Result Value Ref Range   WBC 7.2 4.0 - 10.5 K/uL   RBC 3.77 (L) 3.87 - 5.11 MIL/uL   Hemoglobin 8.0 (L) 12.0 - 15.0 g/dL   HCT 26.8 (L) 36.0 - 46.0 %   MCV 71.1 (L) 78.0 - 100.0 fL   MCH 21.2 (L) 26.0 - 34.0 pg   MCHC 29.9 (L) 30.0 - 36.0 g/dL   RDW 14.8 11.5 - 15.5 %   Platelets 352 150 - 400 K/uL  Differential     Status: None   Collection Time: 03/15/15  5:45 AM  Result Value Ref Range   Neutrophils Relative % 75 43 - 77 %   Lymphocytes Relative 12 12 - 46 %   Monocytes Relative 10 3 - 12 %   Eosinophils Relative 3 0 - 5 %   Basophils Relative 0 0 - 1 %   Neutro Abs 5.4 1.7 - 7.7 K/uL   Lymphs Abs 0.9 0.7 - 4.0 K/uL   Monocytes Absolute 0.7 0.1 - 1.0 K/uL   Eosinophils Absolute 0.2 0.0 - 0.7 K/uL   Basophils Absolute 0.0 0.0 - 0.1 K/uL   Smear Review  LARGE PLATELETS PRESENT   Prealbumin     Status: Abnormal   Collection Time: 03/15/15  5:46 AM  Result Value Ref Range   Prealbumin 5.5 (L) 18 - 38 mg/dL    Comment: Performed at [facility]  Triglycerides     Status: None   Collection Time: 03/15/15  5:46 AM  Result Value Ref Range   Triglycerides 82 <150 mg/dL    Comment: Performed at [facility]  Glucose, capillary     Status: None   Collection Time: 03/15/15  7:14 AM  Result Value Ref Range   Glucose-Capillary 75 65 - 99 mg/dL  Glucose, capillary     Status: None   Collection Time: 03/15/15 12:41 PM  Result Value Ref Range   Glucose-Capillary 82 65 - 99 mg/dL  Glucose, capillary     Status: None   Collection Time: 03/15/15  6:15 PM  Result Value Ref Range   Glucose-Capillary 85 65 - 99 mg/dL  Glucose, capillary     Status: Abnormal   Collection Time: 03/16/15 12:49 AM  Result Value Ref Range   Glucose-Capillary 109 (H) 65 - 99 mg/dL  Comprehensive metabolic panel     Status: Abnormal   Collection Time: 03/16/15  5:00 AM  Result Value Ref Range   Sodium 137 135 - 145 mmol/L   Potassium 4.3 3.5 - 5.1 mmol/L   Chloride 107 101 - 111 mmol/L   CO2 22 22 - 32 mmol/L   Glucose, Bld 103 (H) 65 - 99 mg/dL   BUN 17 6 - 20 mg/dL   Creatinine, Ser 1.32 (H) 0.44 - 1.00 mg/dL   Calcium 8.9 8.9 - 10.3 mg/dL   Total Protein 7.1 6.5 - 8.1 g/dL   Albumin 2.4 (L) 3.5 - 5.0 g/dL   AST 19 15 - 41 U/L   ALT 14 14 - 54 U/L   Alkaline Phosphatase 96 38 - 126 U/L   Total Bilirubin 0.6 0.3 - 1.2 mg/dL   GFR calc non  Af Amer 39 (L) >60 mL/min   GFR calc Af Amer 45 (L) >60 mL/min    Comment: (NOTE) The eGFR has been calculated using the CKD EPI equation. This calculation has not been validated in all clinical situations. eGFR's persistently <60 mL/min signify possible Chronic Kidney Disease.    Anion gap 8 5 - 15  Magnesium     Status: None   Collection Time: 03/16/15  5:00 AM  Result Value Ref Range   Magnesium 2.2  1.7 - 2.4 mg/dL  Phosphorus     Status: None   Collection Time: 03/16/15  5:00 AM  Result Value Ref Range   Phosphorus 2.9 2.5 - 4.6 mg/dL  CBC     Status: Abnormal   Collection Time: 03/16/15  5:00 AM  Result Value Ref Range   WBC 9.1 4.0 - 10.5 K/uL   RBC 3.81 (L) 3.87 - 5.11 MIL/uL   Hemoglobin 8.0 (L) 12.0 - 15.0 g/dL   HCT 26.8 (L) 36.0 - 46.0 %   MCV 70.3 (L) 78.0 - 100.0 fL   MCH 21.0 (L) 26.0 - 34.0 pg   MCHC 29.9 (L) 30.0 - 36.0 g/dL   RDW 14.8 11.5 - 15.5 %   Platelets 304 150 - 400 K/uL  Glucose, capillary     Status: Abnormal   Collection Time: 03/16/15  6:21 AM  Result Value Ref Range   Glucose-Capillary 110 (H) 65 - 99 mg/dL    Radiology/Results: Dg Chest Port 1 View  03/15/2015   CLINICAL DATA:  Central line placement  EXAM: PORTABLE CHEST - 1 VIEW  COMPARISON:  12/27/2014  FINDINGS: Right PICC line tip is in the SVC. NG tube enters the stomach. Heart is borderline in size. No confluent airspace opacities, effusions or edema. No acute bony abnormality.  IMPRESSION: Right PICC line tip in the SVC.  No acute cardiopulmonary disease.   Electronically Signed   By: Rolm Baptise M.D.   On: 03/15/2015 10:35    Anti-infectives: Anti-infectives    Start     Dose/Rate Route Frequency Ordered Stop   03/13/15 2000  ertapenem (INVANZ) 1 g in sodium chloride 0.9 % 50 mL IVPB     1 g 100 mL/hr over 30 Minutes Intravenous Every 24 hours 03/13/15 1829        Assessment/Plan: Problem List: Patient Active Problem List   Diagnosis Date Noted  . 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 Abdominal wall abscess 03/13/2015  . Non-compliant behavior 01/22/2015  . Diarrhea 01/07/2015  . Anemia, iron deficiency 12/21/2014  . Diverticulosis of colon with hemorrhage 12/20/2014  . Chronic diastolic CHF (congestive heart failure) 06/29/2014  . Routine general medical examination at a health care facility 05/30/2014  . Dementia arising in the senium and presenium 03/29/2013  . Acute gouty  arthritis 02/15/2012  . Other screening mammogram 02/15/2012  . Osteopenia 02/15/2012  . 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 Hyperlipidemia with target LDL less than 100 10/06/2011  . OSA (obstructive sleep apnea) 04/02/2009  . GERD 08/20/2006  . Gout 08/15/2006  . Essential hypertension 08/15/2006  . Coronary atherosclerosis 08/15/2006  . Chronic kidney disease, stage 3 08/15/2006  . DVT, HX OF 08/15/2006    Observation with bowel rest and TNA;  Aggressive treatment for gout as this impairs mobility.   * No surgery found *    LOS: 3 days   Matt B. Hassell Done, MD, [facility] [phone] beeper [phone]  03/16/2015 9:53 AM

## 2015-03-17 LAB — BASIC METABOLIC PANEL
Anion gap: 7 (ref 5–15)
BUN: 23 mg/dL — ABNORMAL HIGH (ref 6–20)
CO2: 23 mmol/L (ref 22–32)
CREATININE: 1.23 mg/dL — AB (ref 0.44–1.00)
Calcium: 9.3 mg/dL (ref 8.9–10.3)
Chloride: 109 mmol/L (ref 101–111)
GFR, EST AFRICAN AMERICAN: 49 mL/min — AB (ref >60)
GFR, EST NON AFRICAN AMERICAN: 42 mL/min — AB (ref >60)
Glucose, Bld: 154 mg/dL — ABNORMAL HIGH (ref 65–99)
Potassium: 4.5 mmol/L (ref 3.5–5.1)
Sodium: 139 mmol/L (ref 135–145)

## 2015-03-17 LAB — HEMOGLOBIN A1C
Hgb A1c MFr Bld: 5.7 % — ABNORMAL HIGH (ref 4.8–5.6)
Mean Plasma Glucose: 117 mg/dL

## 2015-03-17 LAB — GLUCOSE, CAPILLARY
Glucose-Capillary: 137 mg/dL — ABNORMAL HIGH (ref 65–99)
Glucose-Capillary: 88 mg/dL (ref 65–99)
Glucose-Capillary: 135 mg/dL — ABNORMAL HIGH (ref 65–99)
Glucose-Capillary: 98 mg/dL (ref 65–99)

## 2015-03-17 MED ORDER — SODIUM CHLORIDE 0.9 % IV SOLN
INTRAVENOUS | Status: AC
  Administered 2015-03-17: 18:00:00 via INTRAVENOUS

## 2015-03-17 MED ORDER — TRACE MINERALS CR-CU-MN-SE-ZN 10-1000-500-60 MCG/ML IV SOLN
INTRAVENOUS | Status: AC
  Administered 2015-03-17: 18:00:00 via INTRAVENOUS
  Filled 2015-03-17: qty 1992

## 2015-03-17 MED ORDER — FAT EMULSION 20 % IV EMUL
240.0000 mL | INTRAVENOUS | Status: AC
  Administered 2015-03-17: 240 mL via INTRAVENOUS
  Filled 2015-03-17: qty 250

## 2015-03-17 MED ORDER — HEPARIN SODIUM (PORCINE) 5000 UNIT/ML IJ SOLN
5000.0000 [IU] | Freq: Three times a day (TID) | INTRAMUSCULAR | Status: DC
  Administered 2015-03-17 – 2015-03-27 (×30): 5000 [IU] via SUBCUTANEOUS
  Filled 2015-03-17 (×38): qty 1

## 2015-03-17 MED ORDER — METOPROLOL TARTRATE 1 MG/ML IV SOLN
5.0000 mg | Freq: Four times a day (QID) | INTRAVENOUS | Status: DC
  Administered 2015-03-17 – 2015-03-25 (×32): 5 mg via INTRAVENOUS
  Filled 2015-03-17 (×38): qty 5

## 2015-03-17 MED ORDER — METHYLPREDNISOLONE SODIUM SUCC 40 MG IJ SOLR
20.0000 mg | Freq: Every day | INTRAMUSCULAR | Status: AC
Start: 2015-03-18 — End: 2015-03-19
  Administered 2015-03-18 – 2015-03-19 (×2): 20 mg via INTRAVENOUS
  Filled 2015-03-17 (×2): qty 0.5

## 2015-03-17 NOTE — Progress Notes (Signed)
Physical Therapy Treatment Patient Details Name: Marissa [name] MRN: [id number] DOB: 08/21/1941 Today's Date: 03/17/2015    History of Present Illness 74 year old female with history of CAD s/p multiple stents, chronic diastolic heart failure, diabetes mellitus with peripheral neuropathy, DVT no longer on anticoagulation secondary to recurrent diverticular and gastric bleeding, gout, obstructive sleep apnea, and multiple abdominal surgeries  Pt admitted for Enterocutaneous fistula and also with gout flare.    PT Comments    Progressing with mobility. Mod encouragement for participation/ambulation.   Follow Up Recommendations  Home health PT;Supervision/Assistance - 24 hour     Equipment Recommendations  None recommended by PT    Recommendations for Other Services       Precautions / Restrictions Precautions Precautions: Fall Precaution Comments: ostomy, NG tube Restrictions Weight Bearing Restrictions: No    Mobility  Bed Mobility Overal bed mobility: Needs Assistance Bed Mobility: Supine to Sit     Supine to sit: Min guard     General bed mobility comments: Increased time. close guard for safety  Transfers Overall transfer level: Needs assistance Equipment used: Rolling walker (2 wheeled) Transfers: Sit to/from Stand Sit to Stand: Min guard         General transfer comment: close guard for safety. VCs safety, hand placement  Ambulation/Gait Ambulation/Gait assistance: Min assist Ambulation Distance (Feet): 150 Feet Assistive device: Rolling walker (2 wheeled) Gait Pattern/deviations: Step-through pattern;Decreased stride length;Trunk flexed     General Gait Details: intermittent assist give for stability. slow gait speed. VCs for proper use of RW.    Stairs            Wheelchair Mobility    Modified Rankin (Stroke Patients Only)       Balance                                    Cognition Arousal/Alertness:  Awake/alert Behavior During Therapy: Flat affect Overall Cognitive Status: No family/caregiver present to determine baseline cognitive functioning                      Exercises      General Comments        Pertinent Vitals/Pain Pain Assessment: 0-10 Pain Score: 8  Pain Location: feet, abdomen Pain Descriptors / Indicators: Sore Pain Intervention(s): Monitored during session;Repositioned    Home Living                      Prior Function            PT Goals (current goals can now be found in the care plan section) Progress towards PT goals: Progressing toward goals    Frequency  Min 3X/week    PT Plan Current plan remains appropriate    Co-evaluation             End of Session   Activity Tolerance: Patient limited by fatigue;Patient limited by pain Patient left: in chair;with call bell/phone within reach     Time: 0902-0925 PT Time Calculation (min) (ACUTE ONLY): 23 min  Charges:  $Gait Training: 8-22 mins $Therapeutic Activity: 8-22 mins                    G Codes:      Weston Anna, MPT Pager: [phone]

## 2015-03-17 NOTE — Consult Note (Signed)
WOC wound follow up Wound type:EC fistula Measurement:Unchanged from Monday Wound IRC:[id number] from Monday Drainage (amount, consistency, odor) thick, yellow-gold effluent Periwound:Not seen today Dressing procedure/placement/frequency:Pouch applied yesterday is intact, but will likely require changing tomorrow instead of Thursday.  Supplies in room. [place] nursing team will  Follow along with you, and will remain available to this patient, the nursing, surgical and medical teams.  Thanks, Maudie Flakes, MSN, RN, [place], [place], [place] [phone])

## 2015-03-17 NOTE — Progress Notes (Addendum)
Was called to pt's room by the pt who stated her NG tube had come out while asleep and she wasn't sure how it happened.  Reinserted new NG tube and pt tolerated very well.

## 2015-03-17 NOTE — Care Management (Signed)
Important Message  Patient Details  Name: Marissa [name] MRN: [id number] Date of Birth: 11-23-1940   Medicare Important Message Given:  Yes-second notification given    Camillo Flaming, LCSW 03/17/2015, 1:58 PM

## 2015-03-17 NOTE — Progress Notes (Signed)
Nutrition Follow-up  DOCUMENTATION CODES:  Obesity unspecified  INTERVENTION:  TPN per Pharmacy Diet advancement per MD RD to continue to monitor  NUTRITION DIAGNOSIS:  Inadequate oral intake related to inability to eat as evidenced by NPO status.  Ongoing.  GOAL:  Patient will meet greater than or equal to 90% of their needs  Will meet with TPN  MONITOR:  Labs, Weight trends, Skin, I & O's, Other (Comment) (TPN tolerance)   ASSESSMENT: 74 year old female with history of CAD s/p multiple stents, chronic diastolic heart failure, diabetes mellitus with peripheral neuropathy, DVT no longer on anticoagulation secondary to recurrent diverticular and gastric bleeding, gout, obstructive sleep apnea, and multiple abdominal surgeries.She presented with abdominal swelling, erythema, pain, warmth, and anorexia.  Pt tolerating TPN. Still NPO.  Plan per Pharmacy: Clinimix E 5/15 at a goal rate of 83 ml/hr + 20% fat emulsion at 10 ml/hr will provide: approximately 100 g/day protein, 1900 Kcal/day.  TPN goal: At 1800 today:  Increase Clinimix E 5/15 to 83 ml/hr (goal rate)  Continue 20% fat emulsion at 10 ml/hr.  Labs reviewed: CBGs: 88-147 Elevated BUN & Creatinine Mg/Phos WNL  Height:  Ht Readings from Last 1 Encounters:  03/14/15 5\' 4"  (1.626 m)    Weight:  Wt Readings from Last 1 Encounters:  03/14/15 222 lb 10.6 oz (101 kg)    Ideal Body Weight:  54.5 kg  Wt Readings from Last 10 Encounters:  03/14/15 222 lb 10.6 oz (101 kg)  03/05/15 219 lb (99.338 kg)  01/14/15 223 lb (101.152 kg)  01/07/15 216 lb (97.977 kg)  12/29/14 229 lb 11.5 oz (104.2 kg)  12/20/14 226 lb 13.7 oz (102.9 kg)  11/26/14 227 lb 4 oz (103.08 kg)  07/21/14 234 lb 6.4 oz (106.323 kg)  07/04/14 230 lb (104.327 kg)  07/01/14 230 lb (104.327 kg)    BMI:  Body mass index is 38.2 kg/(m^2).  Estimated Nutritional Needs:  Kcal:  1650-1850  Protein:  85-100g  Fluid:   1.7L/day    Skin:  Wound (see comment) (abdominal puncture)  Diet Order:  Diet NPO time specified TPN (CLINIMIX-E) Adult .TPN (CLINIMIX-E) Adult  EDUCATION NEEDS:  No education needs identified at this time   Intake/Output Summary (Last 24 hours) at 03/17/15 1334 Last data filed at 03/17/15 1000  Gross per 24 hour  Intake   2005 ml  Output   2195 ml  Net   -190 ml    Last BM:  7/5  Clayton Bibles, MS, RD, LDN Pager: [phone] After Hours Pager: [phone]

## 2015-03-17 NOTE — Progress Notes (Signed)
PT demonstrated verbal and hands on understanding of Flutter device. 

## 2015-03-17 NOTE — Care Management Note (Signed)
Case Management Note  Patient Details  Name: Marissa [name] MRN: [id number] Date of Birth: Dec 26, 1940  Subjective/Objective:                   Enterocutaneous fistula with large abdominal wall abscess Action/Plan: Discharge planning  Expected Discharge Date:                  Expected Discharge Plan:  [place]  In-House Referral:     Discharge planning Services  CM Consult  Post Acute Care Choice:    Choice offered to:     DME Arranged:    DME Agency:     HH Arranged:  RN, PT, OT [place] Agency:  [place]  Status of Service:  In process, will continue to follow  Medicare Important Message Given:  Yes-second notification given Date Medicare IM Given:    Medicare IM give by:    Date Additional Medicare IM Given:    Additional Medicare Important Message give by:     If discussed at [place] of Stay Meetings, dates discussed:    Additional Comments: CM met with pt in room to discuss disposition.  Pt states she has 2 daughters and one granddaughter who are all CNAs and she intends to go home after this hospitalization.  Pt chooses [facility] to render HHPT/OT/RN and any other [facility] disciplines which may be ordered.  Request made for [facility] orders at time of discharge.  Referral made to [facility] rep, Lecretia.  CM will continue  to monitor for needs.   Dellie Catholic, RN 03/17/2015, 2:42 PM

## 2015-03-17 NOTE — Progress Notes (Signed)
[facility] PROGRESS NOTE  Donis Kotowski QKM:[id number] DOB: [birth year] DOA: 03/13/2015 PCP: [name], MD  Brief Summary  The patient is a 74 year old female with history of CAD s/p multiple stents, chronic diastolic heart failure, diabetes mellitus with peripheral neuropathy, DVT no longer on anticoagulation secondary to recurrent diverticular and gastric bleeding, gout, obstructive sleep apnea, and multiple abdominal surgeries. She presented with abdominal swelling, erythema, pain, warmth, and anorexia. CT scan of abdomen and pelvis demonstrated a large subcutaneous abscess 9.1 x 6.8 x 10 cm with a fistula that extended to the abdominal wall. The fistula appeared to originate at the small bowel.  Her abscess spontaneously burst the night of admission.  Surgery is attending and we are assisting with medical management.  Assessment/Plan  Enterocutaneous fistula, may have been caused by diverticulitis that eroded or by reopening of a bowel leak that had spontaneously closed previously. - General surgery managing - Currently nothing by mouth and starting TPN - Ertapenem day 5  Hypotension likely secondary to hypovolemia from poor oral intake, resolved. -  Cortisol 14.5  CAD, HTN, and HLD, BP is elevated, having atypical chest pains - ECG NSR, no ischemic changes -  Metoprolol 5mg  IV q6h with prn hydralazine  Chronic diastolic heart failure, last ECHO 07/2014 with preserved EF and mild concentric LVH - no swelling to suggest volume overload  Acute kidney injury likely due to dehydration and sepsis. Baseline creatinine is 1.4. Peak creatinine 1.9. Resolved with IVF.  Diet-controlled diabetes mellitus, A1c 5.7 -  Insulin per pharmacy while on TPN  Recurrent diverticular hemorrhage and antral gastritis with three admissions in the last three months for bleeding, last admission required two clips to stop gastric bleeding on 01/13/2015.  Gout with acute flare, with knee and  left heel pain, wrist pain.  Pain improved.  Agree that she has a component of peripheral neuropathy, but this does not normally cause warmth and swelling. -  Avoid NSAIDS due to recent AKI -  Continue colchicine BID -  Decrease to solumedrol 20mg  IV daily x 2 more days, then stop -  Continue voltaren gel  Hx of DVT, not on anticoagulation.  No recent bleeding - start heparin  OSA, stable, d/c cpap  Leukocytosis, likely related to underlying infection, resolved with IVF.  Iron deficiency and inflammation anemia, stable at 8mg /dl.  S/p feraheme on 7/5.  Diet:  NPO Access:  PICC IVF: TPN Proph:  SCDs/heparin  Code Status: full Family Communication: patient alone Disposition Plan:   PT/OT recommending [place] therapy.  Possible surgery next week.  Will sign off.  Please reconsult with additional questions or concerns.  Procedures:  CT a/p 7/2:  large subcutaneous abscess 9.1 x 6.8 x 10 cm with a fistula that extends to the abdominal wall. The fistula appears to originate at the small bowel. Her colonic anastomosis appears to be intact and she has colonic diverticulosis without diverticulitis  PICC line 7/3 to right arm  Antibiotics:  Ertapenem 7/1 >   HPI/Subjective:  Joint pains are improved today and was able to walk with PT/OT today.  Described a hot/burning sensation on the top of her feet and a cold sensation on the bottom that gets better with salt soaks at home.  Abdomen has minimal pain.  Ostomy bag has been working well.      Objective: Filed Vitals:   03/16/15 2109 03/17/15 0549 03/17/15 0635 03/17/15 0856  BP: 150/63 184/87 162/73 143/90  Pulse: 73 77  74  Temp: 97.9 F (  36.6 C) 98 F (36.7 C)    TempSrc: Oral Axillary    Resp: 16 18    Height:      Weight:      SpO2: 100% 100%      Intake/Output Summary (Last 24 hours) at 03/17/15 1200 Last data filed at 03/17/15 1000  Gross per 24 hour  Intake   2122 ml  Output   2195 ml  Net    -73 ml   Filed Weights    03/14/15 0226  Weight: 101 kg (222 lb 10.6 oz)    Exam:  Body mass index is 38.2 kg/(m^2).   General:  Obese female, No acute distress, NG tube with mostly clear fluid, nonbilious  HEENT:  NCAT, MMM  Cardiovascular:  RRR, nl S1, S2 no mrg, 2+ pulses, warm extremities  Respiratory:  CTAB, no increased WOB  Abdomen:   NABS, soft, ostomy bag is intact with 1cm fistula  MSK:   Decreased swelling and warmth of knees.  Ankles still somewhat warm to touch and tender.   Neuro:  Grossly intact  Data Reviewed: Basic Metabolic Panel:  Recent Labs Lab 03/13/15 1246 03/14/15 0445 03/14/15 1310 03/15/15 0545 03/16/15 0500 03/17/15 0400  NA 134* 134*  --  137 137 139  K 5.0 4.3  --  4.4 4.3 4.5  CL 103 106  --  109 107 109  CO2 18* 19*  --  20* 22 23  GLUCOSE 103* 112*  --  91 103* 154*  BUN 36* 32*  --  22* 17 23*  CREATININE 1.93* 1.64*  --  1.41* 1.32* 1.23*  CALCIUM 9.6 8.9  --  8.7* 8.9 9.3  MG  --   --  1.8 1.7 2.2  --   PHOS  --   --  3.5 3.2 2.9  --    Liver Function Tests:  Recent Labs Lab 03/13/15 1246 03/15/15 0545 03/16/15 0500  AST 35 17 19  ALT 25 16 14   ALKPHOS 134* 102 96  BILITOT 1.1 0.5 0.6  PROT 9.5* 6.1* 7.1  ALBUMIN 3.3* 2.1* 2.4*   No results for input(s): LIPASE, AMYLASE in the last 168 hours. No results for input(s): AMMONIA in the last 168 hours. CBC:  Recent Labs Lab 03/13/15 1246 03/14/15 0445 03/15/15 0545 03/16/15 0500  WBC 15.5* 10.6* 7.2 9.1  NEUTROABS 13.2*  --  5.4  --   HGB 11.3* 8.4* 8.0* 8.0*  HCT 36.0 28.8* 26.8* 26.8*  MCV 69.1* 69.9* 71.1* 70.3*  PLT 366 318 352 304   Cardiac Enzymes: No results for input(s): CKTOTAL, CKMB, CKMBINDEX, TROPONINI in the last 168 hours. BNP (last 3 results)  Recent Labs  11/02/14 2340  BNP 109.2*    ProBNP (last 3 results) No results for input(s): PROBNP in the last 8760 hours.  CBG:  Recent Labs Lab 03/16/15 1211 03/16/15 1753 03/16/15 2331 03/17/15 0542  03/17/15 1157  GLUCAP 109* 165* 147* 137* 88    Recent Results (from the past 240 hour(s))  Culture, Urine     Status: None   Collection Time: 03/13/15 12:48 PM  Result Value Ref Range Status   Specimen Description URINE, RANDOM  Final   Special Requests NONE  Final   Culture   Final    MULTIPLE SPECIES PRESENT, SUGGEST RECOLLECTION IF CLINICALLY INDICATED Performed at [facility]    Report Status 03/15/2015 FINAL  Final     Studies: No results found.  Scheduled Meds: . colchicine  0.6 mg Oral BID  . diclofenac sodium  4 g Topical QID  . ertapenem  1 g Intravenous Q24H  . heparin subcutaneous  5,000 Units Subcutaneous 3 times per day  . insulin aspart  0-9 Units Subcutaneous 4 times per day  . methylPREDNISolone (SOLU-MEDROL) injection  40 mg Intravenous Daily  . metoprolol  5 mg Intravenous 4 times per day  . pantoprazole (PROTONIX) IV  40 mg Intravenous QHS  . sodium chloride  10-40 mL Intracatheter Q12H   Continuous Infusions: . Marland KitchenTPN (CLINIMIX-E) Adult     And  . fat emulsion    . sodium chloride 45 mL/hr at 03/16/15 1307  . sodium chloride    . Marland KitchenTPN (CLINIMIX-E) Adult 65 mL/hr at 03/16/15 1805   And  . fat emulsion 240 mL (03/16/15 1806)    Principal Problem:   Enterocutaneous fistula Active Problems:   Gout   OSA (obstructive sleep apnea)   Essential hypertension   Coronary atherosclerosis   Chronic diastolic CHF (congestive heart failure)   Anemia, iron deficiency   Abdominal wall abscess    Time spent: 30 min    Jayven Naill, [place] [facility] Pager [phone]. If 7PM-7AM, please contact night-coverage at [email or web address], password [facility] 03/17/2015, 12:00 PM  LOS: 4 days

## 2015-03-17 NOTE — Plan of Care (Signed)
Problem: Phase I Progression Outcomes Goal: OOB as tolerated unless otherwise ordered Outcome: Progressing Pt walked hall with PT

## 2015-03-17 NOTE — Progress Notes (Signed)
Patient ID: Marissa [name], female   DOB: [birth year], 74 y.o.   MRN: [id number]     [facility]      [address] [place], [place] [address]    Phone: [phone] FAX: [phone]     Subjective: No issues.  Odd affect. Denies sob, cp, palpitations.  C/o bilateral foot pain, worse on the right.  Characterized as burning pain, but bilateral.   Objective:  Vital signs:  Filed Vitals:   03/16/15 1756 03/16/15 2109 03/17/15 0549 03/17/15 0635  BP:  150/63 184/87 162/73  Pulse:  73 77   Temp: 97.7 F (36.5 C) 97.9 F (36.6 C) 98 F (36.7 C)   TempSrc: Oral Oral Axillary   Resp:  16 18   Height:      Weight:      SpO2:  100% 100%     Last BM Date: 03/13/15  Intake/Output   Yesterday:  07/04 0701 - 07/05 0700 In: 2162 [I.V.:1130; NG/GT:15; IV Piggyback:117; TPN:900] Out: 2095 [Urine:1150; Emesis/NG output:875; Drains:70] This shift: I/O last 3 completed shifts: In: 3466.5 [I.V.:1806; NG/GT:15; IV Piggyback:167] Out: 7517 [Urine:1750; Emesis/NG output:1025; Drains:170]    Physical Exam: General: Pt awake/alert/oriented x4 in no acute distress Chest: cta.  No chest wall pain w good excursion CV:  Pulses intact.  Regular rhythm MS: Normal AROM mjr joints.  No obvious deformity Abdomen: Soft.  Nondistended. Non tender. eakin's pouch in place with feculent milky output.  No evidence of peritonitis.  No incarcerated hernias. Ext:  SCDs BLE.  No mjr edema.  No cyanosis Skin: No petechiae / purpura   Problem List:   Principal Problem:   Enterocutaneous fistula Active Problems:   Gout   OSA (obstructive sleep apnea)   Essential hypertension   Coronary atherosclerosis   Chronic diastolic CHF (congestive heart failure)   Anemia, iron deficiency   Abdominal wall abscess    Results:   Labs: Results for orders placed or performed during the hospital encounter of 03/13/15 (from the past 48 hour(s))  Glucose, capillary      Status: None   Collection Time: 03/15/15 12:41 PM  Result Value Ref Range   Glucose-Capillary 82 65 - 99 mg/dL  Glucose, capillary     Status: None   Collection Time: 03/15/15  6:15 PM  Result Value Ref Range   Glucose-Capillary 85 65 - 99 mg/dL  Glucose, capillary     Status: Abnormal   Collection Time: 03/16/15 12:49 AM  Result Value Ref Range   Glucose-Capillary 109 (H) 65 - 99 mg/dL  Comprehensive metabolic panel     Status: Abnormal   Collection Time: 03/16/15  5:00 AM  Result Value Ref Range   Sodium 137 135 - 145 mmol/L   Potassium 4.3 3.5 - 5.1 mmol/L   Chloride 107 101 - 111 mmol/L   CO2 22 22 - 32 mmol/L   Glucose, Bld 103 (H) 65 - 99 mg/dL   BUN 17 6 - 20 mg/dL   Creatinine, Ser 1.32 (H) 0.44 - 1.00 mg/dL   Calcium 8.9 8.9 - 10.3 mg/dL   Total Protein 7.1 6.5 - 8.1 g/dL   Albumin 2.4 (L) 3.5 - 5.0 g/dL   AST 19 15 - 41 U/L   ALT 14 14 - 54 U/L   Alkaline Phosphatase 96 38 - 126 U/L   Total Bilirubin 0.6 0.3 - 1.2 mg/dL   GFR calc non Af Amer 39 (L) >60 mL/min   GFR  calc Af Amer 45 (L) >60 mL/min    Comment: (NOTE) The eGFR has been calculated using the CKD EPI equation. This calculation has not been validated in all clinical situations. eGFR's persistently <60 mL/min signify possible Chronic Kidney Disease.    Anion gap 8 5 - 15  Magnesium     Status: None   Collection Time: 03/16/15  5:00 AM  Result Value Ref Range   Magnesium 2.2 1.7 - 2.4 mg/dL  Phosphorus     Status: None   Collection Time: 03/16/15  5:00 AM  Result Value Ref Range   Phosphorus 2.9 2.5 - 4.6 mg/dL  CBC     Status: Abnormal   Collection Time: 03/16/15  5:00 AM  Result Value Ref Range   WBC 9.1 4.0 - 10.5 K/uL   RBC 3.81 (L) 3.87 - 5.11 MIL/uL   Hemoglobin 8.0 (L) 12.0 - 15.0 g/dL   HCT 26.8 (L) 36.0 - 46.0 %   MCV 70.3 (L) 78.0 - 100.0 fL   MCH 21.0 (L) 26.0 - 34.0 pg   MCHC 29.9 (L) 30.0 - 36.0 g/dL   RDW 14.8 11.5 - 15.5 %   Platelets 304 150 - 400 K/uL  Glucose, capillary      Status: Abnormal   Collection Time: 03/16/15  6:21 AM  Result Value Ref Range   Glucose-Capillary 110 (H) 65 - 99 mg/dL  Glucose, capillary     Status: Abnormal   Collection Time: 03/16/15 12:11 PM  Result Value Ref Range   Glucose-Capillary 109 (H) 65 - 99 mg/dL  Glucose, capillary     Status: Abnormal   Collection Time: 03/16/15  5:53 PM  Result Value Ref Range   Glucose-Capillary 165 (H) 65 - 99 mg/dL  Glucose, capillary     Status: Abnormal   Collection Time: 03/16/15 11:31 PM  Result Value Ref Range   Glucose-Capillary 147 (H) 65 - 99 mg/dL  Basic metabolic panel     Status: Abnormal   Collection Time: 03/17/15  4:00 AM  Result Value Ref Range   Sodium 139 135 - 145 mmol/L   Potassium 4.5 3.5 - 5.1 mmol/L   Chloride 109 101 - 111 mmol/L   CO2 23 22 - 32 mmol/L   Glucose, Bld 154 (H) 65 - 99 mg/dL   BUN 23 (H) 6 - 20 mg/dL   Creatinine, Ser 1.23 (H) 0.44 - 1.00 mg/dL   Calcium 9.3 8.9 - 10.3 mg/dL   GFR calc non Af Amer 42 (L) >60 mL/min   GFR calc Af Amer 49 (L) >60 mL/min    Comment: (NOTE) The eGFR has been calculated using the CKD EPI equation. This calculation has not been validated in all clinical situations. eGFR's persistently <60 mL/min signify possible Chronic Kidney Disease.    Anion gap 7 5 - 15  Glucose, capillary     Status: Abnormal   Collection Time: 03/17/15  5:42 AM  Result Value Ref Range   Glucose-Capillary 137 (H) 65 - 99 mg/dL    Imaging / Studies: Dg Chest Port 1 View  03/15/2015   CLINICAL DATA:  Central line placement  EXAM: PORTABLE CHEST - 1 VIEW  COMPARISON:  12/27/2014  FINDINGS: Right PICC line tip is in the SVC. NG tube enters the stomach. Heart is borderline in size. No confluent airspace opacities, effusions or edema. No acute bony abnormality.  IMPRESSION: Right PICC line tip in the SVC.  No acute cardiopulmonary disease.   Electronically Signed  By: Rolm Baptise M.D.   On: 03/15/2015 10:35    Medications / Allergies:   Scheduled Meds: . amLODipine  5 mg Oral Daily  . colchicine  0.6 mg Oral BID  . diclofenac sodium  4 g Topical QID  . ertapenem  1 g Intravenous Q24H  . insulin aspart  0-9 Units Subcutaneous 4 times per day  . methylPREDNISolone (SOLU-MEDROL) injection  40 mg Intravenous Daily  . pantoprazole (PROTONIX) IV  40 mg Intravenous QHS  . sodium chloride  10-40 mL Intracatheter Q12H   Continuous Infusions: . Marland KitchenTPN (CLINIMIX-E) Adult     And  . fat emulsion    . sodium chloride 45 mL/hr at 03/16/15 1307  . sodium chloride    . Marland KitchenTPN (CLINIMIX-E) Adult 65 mL/hr at 03/16/15 1805   And  . 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  C/w eakin's pouch(M-TH). ID-Invanz D#4 for abdominal wall abscess VTE prophylaxis-add heparin, c/w SCD CKD-stable ?Gout-although it sounds more like neuropathy at this point.  Steroids per medicine, but would like to taper off when possible for pending surgery CV-Hx CAD, HTN-.BP up, stop amlodipine and add scheduled metoprolol  OSA-CPAP Dx DVT-not on chronic anticoagulation, add heparin Anemia of chronic disease-down, but stable, monitor. dispo-continue inpatient.  Surgery likely next week   Erby Pian, [facility] Pager [phone](7A-4:30P)   03/17/2015 8:15 AM

## 2015-03-17 NOTE — Progress Notes (Signed)
PARENTERAL NUTRITION CONSULT NOTE - FOLLOW UP  Pharmacy Consult for TPN Indication: Enterocutaneous fistula  No Known Allergies  Patient Measurements: Height: 5\' 4"  (162.6 cm) Weight: 222 lb 10.6 oz (101 kg) IBW/kg (Calculated) : 54.7 Adjusted Body Weight: 70 kg Usual Weight: 101 kg  Vital Signs: Temp: 98 F (36.7 C) (07/05 0549) Temp Source: Axillary (07/05 0549) BP: 162/73 mmHg (07/05 0635) Pulse Rate: 77 (07/05 0549) Intake/Output from previous day: 07/04 0701 - 07/05 0700 In: 2162 [I.V.:1130; NG/GT:15; IV Piggyback:117; TPN:900] Out: 2095 [Urine:1150; Emesis/NG output:875; Drains:70] Intake/Output from this shift:    Labs:  Recent Labs  03/15/15 0545 03/16/15 0500  WBC 7.2 9.1  HGB 8.0* 8.0*  HCT 26.8* 26.8*  PLT 352 304     Recent Labs  03/14/15 1310 03/15/15 0545 03/15/15 0546 03/16/15 0500 03/17/15 0400  NA  --  137  --  137 139  K  --  4.4  --  4.3 4.5  CL  --  109  --  107 109  CO2  --  20*  --  22 23  GLUCOSE  --  91  --  103* 154*  BUN  --  22*  --  17 23*  CREATININE  --  1.41*  --  1.32* 1.23*  CALCIUM  --  8.7*  --  8.9 9.3  MG 1.8 1.7  --  2.2  --   PHOS 3.5 3.2  --  2.9  --   PROT  --  6.1*  --  7.1  --   ALBUMIN  --  2.1*  --  2.4*  --   AST  --  17  --  19  --   ALT  --  16  --  14  --   ALKPHOS  --  102  --  96  --   BILITOT  --  0.5  --  0.6  --   PREALBUMIN  --   --  5.5*  --   --   TRIG  --   --  82  --   --   Corr Ca 10.6 Estimated Creatinine Clearance: 46.4 mL/min (by C-G formula based on Cr of 1.23).    Recent Labs  03/16/15 1753 03/16/15 2331 03/17/15 0542  GLUCAP 165* 147* 137*    Insulin Requirements: sensitive scale Novolog q6h, 3 units administered in past 24 hours  Current Nutrition:  NPO Clinmix-E 5/15 at 65 mL/hr Fat Emulsion 20% at 10 mL/hr  IVF: NS @ 45 ml/hr  Central access: 7/3 TPN start date:  7/3  ASSESSMENT  HPI: [name] admitted 7/1 with abdominal pain. Per CT: mesh erosion (previous hernia repair) into small bowel with abscess and EC fistula. Bowel rest planned, TPN initiated with pharmacy dosing assistance requested.  Significant events:  7/2: unable to place PICC; TPN postponed 7/3: TPN started 7/4: Feraheme 510 mg IV x 1 administered for iron-deficiency anemia. 7/5: Patient accidentally removed NGT during sleep early this AM; reinserted by RN.  Today, 03/17/2015:   Glucose - adequate control with SSI Novolog. Concomitant SoluMedrol for gout noted.   Electrolytes -  Mg improved after replenishment on 7/3; all lytes now WNL.  Renal - SCr elevated on admission, now steadily improving. BUN slightly elevated.  LFTs - all below ULN (7/4)  TGs - WNL (7/4)  Prealbumin - baseline 5.5 (7/3)  NUTRITIONAL GOALS  RD recommendations from 7/3: 1650 - 1850 KCal/day 80-100 grams protein//day  Clinimix E 5/15 at  a goal rate of 83 ml/hr + 20% fat emulsion at 10 ml/hr will provide: approximately 100 g/day protein, 1900 Kcal/day.  PLAN  1. At 1800 today:  Increase Clinimix E 5/15 to 83 ml/hr (goal rate)  Continue 20% fat emulsion at 10 ml/hr.  TPN to contain standard multivitamins and trace elements.  Decrease  NS to 30 ml/hr 2. Continue SSI as ordered 3. TPN lab panels on Mondays & Thursdays. 4. Follow clinical course daily.  Clayburn Pert, PharmD, BCPS Pager: [phone] 03/17/2015  7:49 AM

## 2015-03-18 LAB — GLUCOSE, CAPILLARY
Glucose-Capillary: 117 mg/dL — ABNORMAL HIGH (ref 65–99)
Glucose-Capillary: 119 mg/dL — ABNORMAL HIGH (ref 65–99)
Glucose-Capillary: 186 mg/dL — ABNORMAL HIGH (ref 65–99)
Glucose-Capillary: 92 mg/dL (ref 65–99)

## 2015-03-18 LAB — CBC
HEMATOCRIT: 28.9 % — AB (ref 36.0–46.0)
HEMOGLOBIN: 8.7 g/dL — AB (ref 12.0–15.0)
MCH: 21.2 pg — ABNORMAL LOW (ref 26.0–34.0)
MCHC: 30.1 g/dL (ref 30.0–36.0)
MCV: 70.3 fL — ABNORMAL LOW (ref 78.0–100.0)
Platelets: 333 10*3/uL (ref 150–400)
RBC: 4.11 MIL/uL (ref 3.87–5.11)
RDW: 14.8 % (ref 11.5–15.5)
WBC: 11.5 10*3/uL — ABNORMAL HIGH (ref 4.0–10.5)

## 2015-03-18 MED ORDER — TRACE MINERALS CR-CU-MN-SE-ZN 10-1000-500-60 MCG/ML IV SOLN
INTRAVENOUS | Status: AC
  Administered 2015-03-18: 17:00:00 via INTRAVENOUS
  Filled 2015-03-18: qty 1992

## 2015-03-18 MED ORDER — FAT EMULSION 20 % IV EMUL
240.0000 mL | INTRAVENOUS | Status: AC
  Administered 2015-03-18: 240 mL via INTRAVENOUS
  Filled 2015-03-18: qty 250

## 2015-03-18 NOTE — Progress Notes (Signed)
[facility] Progress Note     Subjective: Pt doing okay, pain controlled with meds.  No N/V.  NPO with TPN running.  Not very hungry or thirsty.  Not ambulating much.  Frustrated that this has been going on so long.  Eakins pouch working well.  Objective: Vital signs in last 24 hours: Temp:  [97.4 F (36.3 C)-98.6 F (37 C)] 97.5 F (36.4 C) (07/06 0513) Pulse Rate:  [71-82] 82 (07/06 0513) Resp:  [16-18] 18 (07/06 0513) BP: (133-176)/(56-90) 158/90 mmHg (07/06 0513) SpO2:  [100 %] 100 % (07/06 0513) Last BM Date: 03/17/15  Intake/Output from previous day: 07/05 0701 - 07/06 0700 In: 1759 [I.V.:900; NG/GT:15; TPN:844] Out: 2500 [Urine:1400; Emesis/NG output:1050; Drains:50] Intake/Output this shift:    PE: Gen:  Alert, NAD, pleasant Abd: Obese, soft, not distended, mild tenderness throughout, +BS, Midline incision with 1cm wound defect draining purulent yellow/tan drainage   Lab Results:   Recent Labs  03/16/15 0500 03/18/15 0455  WBC 9.1 11.5*  HGB 8.0* 8.7*  HCT 26.8* 28.9*  PLT 304 333   BMET  Recent Labs  03/16/15 0500 03/17/15 0400  NA 137 139  K 4.3 4.5  CL 107 109  CO2 22 23  GLUCOSE 103* 154*  BUN 17 23*  CREATININE 1.32* 1.23*  CALCIUM 8.9 9.3   PT/INR No results for input(s): LABPROT, INR in the last 72 hours. CMP     Component Value Date/Time   NA 139 03/17/2015 0400   K 4.5 03/17/2015 0400   CL 109 03/17/2015 0400   CO2 23 03/17/2015 0400   GLUCOSE 154* 03/17/2015 0400   BUN 23* 03/17/2015 0400   CREATININE 1.23* 03/17/2015 0400   CREATININE 1.64* 06/27/2014 1647   CALCIUM 9.3 03/17/2015 0400   PROT 7.1 03/16/2015 0500   ALBUMIN 2.4* 03/16/2015 0500   AST 19 03/16/2015 0500   ALT 14 03/16/2015 0500   ALKPHOS 96 03/16/2015 0500   BILITOT 0.6 03/16/2015 0500   GFRNONAA 42* 03/17/2015 0400   GFRAA 49* 03/17/2015 0400   Lipase     Component Value Date/Time   LIPASE 27 12/26/2014 1815        Studies/Results: No results found.  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  -Concern that she had mesh and she has infected mesh, but no evidence of mesh insertion in Epic -Abscess spontaneously drained and well controlled with eakin's pouch (to be changed M/TH). -Encouraged ambulatin PCM/TNA ID-Invanz D#5 for abdominal wall abscess VTE prophylaxis-add heparin, c/w SCD CKD-stable ?Gout-Steroids per medicine, they are tapering off finish on 7/7. CV-Hx CAD, HTN-.adjustements made by medicine, if not improving may need to call medicine back OSA-CPAP H/o diagnosis of DVT- SCD's and heparin Anemia of chronic disease-improving Dispo-Continue inpatient. Allow her to cool off with IV antibiotics and plan for surgery next week with Dr. Hassell Done    LOS: 5 days    [name] 03/18/2015, 8:10 AM Pager: [phone]

## 2015-03-18 NOTE — Progress Notes (Signed)
PARENTERAL NUTRITION CONSULT NOTE - FOLLOW UP  Pharmacy Consult for TPN Indication: Enterocutaneous fistula  No Known Allergies  Patient Measurements: Height: 5\' 4"  (162.6 cm) Weight: 222 lb 10.6 oz (101 kg) IBW/kg (Calculated) : 54.7 Adjusted Body Weight: 70 kg Usual Weight: 101 kg  Vital Signs: Temp: 97.5 F (36.4 C) (07/06 0513) Temp Source: Oral (07/06 0513) BP: 158/90 mmHg (07/06 0513) Pulse Rate: 82 (07/06 0513) Intake/Output from previous day: 07/05 0701 - 07/06 0700 In: 1759 [I.V.:900; NG/GT:15; TPN:844] Out: 2500 [Urine:1400; Emesis/NG output:1050; Drains:50] Intake/Output from this shift:    Labs:  Recent Labs  03/16/15 0500 03/18/15 0455  WBC 9.1 11.5*  HGB 8.0* 8.7*  HCT 26.8* 28.9*  PLT 304 333     Recent Labs  03/16/15 0500 03/17/15 0400  NA 137 139  K 4.3 4.5  CL 107 109  CO2 22 23  GLUCOSE 103* 154*  BUN 17 23*  CREATININE 1.32* 1.23*  CALCIUM 8.9 9.3  MG 2.2  --   PHOS 2.9  --   PROT 7.1  --   ALBUMIN 2.4*  --   AST 19  --   ALT 14  --   ALKPHOS 96  --   BILITOT 0.6  --    Estimated Creatinine Clearance: 46.4 mL/min (by C-G formula based on Cr of 1.23).    Recent Labs  03/17/15 1759 03/17/15 2329 03/18/15 0527  GLUCAP 135* 98 92    Insulin Requirements: sensitive scale Novolog q6h, 1 unit administered in past 24 hours  Current Nutrition: NPO  IVF: NS @ 30 ml/hr  Central access: 7/3 TPN start date: 7/3  ASSESSMENT  HPI: [name] admitted 7/1 with abdominal pain. Per CT: mesh erosion (previous hernia repair) into small bowel with abscess and EC fistula. Bowel rest planned, TPN initiated with pharmacy dosing assistance requested.  Significant events:  7/2: unable to place PICC; TPN postponed 7/3: TPN started 7/4: Feraheme 510 mg IV x 1 administered for iron-deficiency anemia. 7/5: Patient accidentally removed NGT  during sleep early this AM; reinserted by RN.  Today, 03/18/2015:   Glucose - adequate control with SSI Novolog. Concomitant SoluMedrol for gout noted (to be d/c'ed after 7/7 dose).   Electrolytes -  Mg improved after replenishment on 7/3; all lytes now WNL (7/5).  Renal - SCr elevated on admission, now steadily improving. BUN slightly elevated.  LFTs - all below ULN (7/4)  TGs - WNL, 82 (7/4)  Prealbumin - baseline 5.5 (7/3)  NUTRITIONAL GOALS  RD recommendations from 7/5: 1650 - 1850 KCal/day 85-100 grams protein//day  Clinimix E 5/15 at a goal rate of 83 ml/hr + 20% fat emulsion at 10 ml/hr will provide: 100 g/day protein, 1894 Kcal/day.  PLAN  1. At 1800 today:  Continue Clinimix E 5/15 at 83 ml/hr (goal rate)  Continue 20% fat emulsion at 10 ml/hr.  TPN to contain standard multivitamins and trace elements.  Continue NS at 30 ml/hr 2. Continue SSI as ordered. May be able to transition to q8h CBGs once steroids tapered off.  3. TPN lab panels on Mondays & Thursdays. 4. Follow clinical course daily.   Lindell Spar, PharmD, BCPS Pager: [phone] 03/18/2015 9:35 AM

## 2015-03-18 NOTE — Consult Note (Signed)
WOC wound follow up Wound type:EC Fistula Measurement:3/4 inches round with closed wound edges. Flush with skin. Wound PTE:LMRA, moist Drainage thick, tan Dressing procedure/placement/frequency: Fistula pouch changed without discomfort. Effluent is contained in pouching system. Replacement pouching supplies are at bedside. Continue pouch changes on M/W/F. [place] nursing team will follow, and will remain available to this patient, the nursing, surgical and medical teams.   Thanks, Maudie Flakes, MSN, RN, [place], [place], [place] [phone])

## 2015-03-19 DIAGNOSIS — Z01810 Encounter for preprocedural cardiovascular examination: Secondary | ICD-10-CM

## 2015-03-19 DIAGNOSIS — R0789 Other chest pain: Secondary | ICD-10-CM

## 2015-03-19 DIAGNOSIS — I2510 Atherosclerotic heart disease of native coronary artery without angina pectoris: Secondary | ICD-10-CM

## 2015-03-19 DIAGNOSIS — K632 Fistula of intestine: Principal | ICD-10-CM

## 2015-03-19 DIAGNOSIS — I5032 Chronic diastolic (congestive) heart failure: Secondary | ICD-10-CM

## 2015-03-19 DIAGNOSIS — Z9861 Coronary angioplasty status: Secondary | ICD-10-CM

## 2015-03-19 LAB — COMPREHENSIVE METABOLIC PANEL
ALK PHOS: 105 U/L (ref 38–126)
ALT: 66 U/L — ABNORMAL HIGH (ref 14–54)
ANION GAP: 7 (ref 5–15)
AST: 63 U/L — ABNORMAL HIGH (ref 15–41)
Albumin: 2.7 g/dL — ABNORMAL LOW (ref 3.5–5.0)
BILIRUBIN TOTAL: 0.3 mg/dL (ref 0.3–1.2)
BUN: 35 mg/dL — ABNORMAL HIGH (ref 6–20)
CHLORIDE: 106 mmol/L (ref 101–111)
CO2: 24 mmol/L (ref 22–32)
Calcium: 9 mg/dL (ref 8.9–10.3)
Creatinine, Ser: 1.16 mg/dL — ABNORMAL HIGH (ref 0.44–1.00)
GFR calc non Af Amer: 45 mL/min — ABNORMAL LOW (ref >60)
GFR, EST AFRICAN AMERICAN: 52 mL/min — AB (ref >60)
GLUCOSE: 101 mg/dL — AB (ref 65–99)
POTASSIUM: 3.8 mmol/L (ref 3.5–5.1)
Sodium: 137 mmol/L (ref 135–145)
Total Protein: 7.2 g/dL (ref 6.5–8.1)

## 2015-03-19 LAB — CBC
HCT: 30.6 % — ABNORMAL LOW (ref 36.0–46.0)
Hemoglobin: 9.1 g/dL — ABNORMAL LOW (ref 12.0–15.0)
MCH: 20.7 pg — ABNORMAL LOW (ref 26.0–34.0)
MCHC: 29.7 g/dL — AB (ref 30.0–36.0)
MCV: 69.5 fL — ABNORMAL LOW (ref 78.0–100.0)
PLATELETS: 378 10*3/uL (ref 150–400)
RBC: 4.4 MIL/uL (ref 3.87–5.11)
RDW: 14.8 % (ref 11.5–15.5)
WBC: 11.3 10*3/uL — ABNORMAL HIGH (ref 4.0–10.5)

## 2015-03-19 LAB — MAGNESIUM: Magnesium: 2.2 mg/dL (ref 1.7–2.4)

## 2015-03-19 LAB — GLUCOSE, CAPILLARY
Glucose-Capillary: 116 mg/dL — ABNORMAL HIGH (ref 65–99)
Glucose-Capillary: 127 mg/dL — ABNORMAL HIGH (ref 65–99)
Glucose-Capillary: 94 mg/dL (ref 65–99)
Glucose-Capillary: 98 mg/dL (ref 65–99)

## 2015-03-19 LAB — TROPONIN I
Troponin I: <0.03 ng/mL (ref <0.031)
Troponin I: <0.03 ng/mL (ref <0.031)

## 2015-03-19 LAB — BRAIN NATRIURETIC PEPTIDE: B Natriuretic Peptide: 141.6 pg/mL — ABNORMAL HIGH (ref 0.0–100.0)

## 2015-03-19 LAB — PHOSPHORUS: Phosphorus: 3.2 mg/dL (ref 2.5–4.6)

## 2015-03-19 MED ORDER — AMLODIPINE BESYLATE 10 MG PO TABS
10.0000 mg | ORAL_TABLET | Freq: Every day | ORAL | Status: DC
  Administered 2015-03-19 – 2015-03-27 (×9): 10 mg via ORAL
  Filled 2015-03-19 (×9): qty 1

## 2015-03-19 MED ORDER — SODIUM CHLORIDE 0.9 % IV SOLN
INTRAVENOUS | Status: DC
  Administered 2015-03-19 – 2015-03-26 (×2): via INTRAVENOUS

## 2015-03-19 MED ORDER — FAT EMULSION 20 % IV EMUL
240.0000 mL | INTRAVENOUS | Status: AC
  Administered 2015-03-19: 240 mL via INTRAVENOUS
  Filled 2015-03-19: qty 250

## 2015-03-19 MED ORDER — TRACE MINERALS CR-CU-MN-SE-ZN 10-1000-500-60 MCG/ML IV SOLN
INTRAVENOUS | Status: AC
  Administered 2015-03-19: 18:00:00 via INTRAVENOUS
  Filled 2015-03-19: qty 1992

## 2015-03-19 NOTE — Progress Notes (Signed)
PARENTERAL NUTRITION CONSULT NOTE - FOLLOW UP  Pharmacy Consult for TPN Indication: Enterocutaneous fistula  No Known Allergies  Patient Measurements: Height: 5\' 4"  (162.6 cm) Weight: 222 lb 10.6 oz (101 kg) IBW/kg (Calculated) : 54.7 Adjusted Body Weight: 70 kg Usual Weight: 101 kg  Vital Signs: Temp: 98.2 F (36.8 C) (07/07 0540) Temp Source: Oral (07/07 0540) BP: 141/72 mmHg (07/07 0540) Pulse Rate: 78 (07/07 0540) Intake/Output from previous day: 07/06 0701 - 07/07 0700 In: 720 [I.V.:720] Out: 2745 [Urine:2000; Emesis/NG output:700; Drains:40; Stool:5] Intake/Output from this shift:    Labs:  Recent Labs  03/18/15 0455 03/19/15 0515  WBC 11.5* 11.3*  HGB 8.7* 9.1*  HCT 28.9* 30.6*  PLT 333 378     Recent Labs  03/17/15 0400 03/19/15 0515  NA 139 137  K 4.5 3.8  CL 109 106  CO2 23 24  GLUCOSE 154* 101*  BUN 23* 35*  CREATININE 1.23* 1.16*  CALCIUM 9.3 9.0  MG  --  2.2  PHOS  --  3.2  PROT  --  7.2  ALBUMIN  --  2.7*  AST  --  63*  ALT  --  66*  ALKPHOS  --  105  BILITOT  --  0.3   Estimated Creatinine Clearance: 49.2 mL/min (by C-G formula based on Cr of 1.16).    Recent Labs  03/18/15 1810 03/18/15 2352 03/19/15 0629  GLUCAP 186* 117* 98    Insulin Requirements: sensitive scale Novolog q6h, 2 units administered in past 24 hours  Current Nutrition: NPO  IVF: NS @ 30 ml/hr  Central access: 7/3 TPN start date: 7/3  ASSESSMENT  HPI: [name] admitted 7/1 with abdominal pain. Per CT: mesh erosion (previous hernia repair) into small bowel with abscess and EC fistula. Bowel rest planned, TPN initiated with pharmacy dosing assistance requested.  Significant events:  7/2: unable to place PICC; TPN postponed 7/3: TPN started 7/4: Feraheme 510 mg IV x 1 administered for iron-deficiency anemia. 7/5: Patient accidentally removed NGT  during sleep early this AM; reinserted by RN.  Today, 03/19/2015:   Glucose - 1 value > goal of < 150, all others at goal. Concomitant solu-medrol for gout noted (to be completed after today's dose).   Electrolytes -  All lytes, including Corrected Ca, WNL  Renal - SCr elevated on admission, now steadily improving.  LFTs - AST, ALT elevated/increased, Alk Phos, Tbili WNL  TGs - WNL, 82 (7/4)  Prealbumin - baseline 5.5 (7/3)  NUTRITIONAL GOALS  RD recommendations from 7/5: 1650 - 1850 KCal/day 85-100 grams protein//day  Clinimix E 5/15 at a goal rate of 83 ml/hr + 20% fat emulsion at 10 ml/hr will provide: 100 g/day protein, 1894 Kcal/day.  PLAN  1. At 1800 today:  Continue Clinimix E 5/15 at 83 ml/hr (goal rate).  Continue 20% fat emulsion at 10 ml/hr.  TPN to contain standard multivitamins and trace elements.  Decrease NS to KVO.  2. Continue SSI as ordered q6h. May be able to transition to q8h CBGs once steroids tapered off.  3. CMET in AM. If LFTs continue to rise, will consider decreasing rate of TPN.  4. TPN lab panels on Mondays & Thursdays. 5. Follow clinical course daily.   Marissa [name], PharmD, BCPS Pager: [phone] 03/19/2015 7:51 AM

## 2015-03-19 NOTE — Progress Notes (Signed)
Physical Therapy Treatment Patient Details Name: Marissa [name] MRN: [id number] DOB: 1941/03/06 Today's Date: 03/19/2015    History of Present Illness 74 year old female with history of CAD s/p multiple stents, chronic diastolic heart failure, diabetes mellitus with peripheral neuropathy, DVT no longer on anticoagulation secondary to recurrent diverticular and gastric bleeding, gout, obstructive sleep apnea, and multiple abdominal surgeries  Pt admitted for Enterocutaneous fistula and also with gout flare.    PT Comments    Pt assisted with ambulating in hallway and able to tolerate increased distance today.  Follow Up Recommendations  Home health PT;Supervision/Assistance - 24 hour     Equipment Recommendations  None recommended by PT    Recommendations for Other Services       Precautions / Restrictions Precautions Precautions: Fall Precaution Comments: EC fistula pouch, NG tube    Mobility  Bed Mobility Overal bed mobility: Needs Assistance Bed Mobility: Supine to Sit;Sit to Supine     Supine to sit: Min guard Sit to supine: Mod assist   General bed mobility comments: Increased time. close guard for safety. assist for LEs onto bed  Transfers Overall transfer level: Needs assistance Equipment used: None Transfers: Sit to/from [facility] Sit to Stand: Min guard Stand pivot transfers: Min guard       General transfer comment: close guard for safety. verbal cues for hand placement as pt did not wish to use RW for pivot to [facility] prior to ambulation however needs UE support to steady, assist to don mesh undergarment and pad, standby/guard for pt washing hands at sink  Ambulation/Gait Ambulation/Gait assistance: Min guard Ambulation Distance (Feet): 400 Feet Assistive device: Rolling walker (2 wheeled) Gait Pattern/deviations: Step-through pattern;Decreased stride length     General Gait Details: verbal cues for RW distance, posture, slow speed however  pt able to improve distance today    Stairs            Wheelchair Mobility    Modified Rankin (Stroke Patients Only)       Balance                                    Cognition Arousal/Alertness: Awake/alert Behavior During Therapy: Flat affect Overall Cognitive Status: No family/caregiver present to determine baseline cognitive functioning                      Exercises      General Comments        Pertinent Vitals/Pain Pain Assessment: 0-10 Pain Score: 4  Pain Location: abdomen with transitional movements Pain Descriptors / Indicators: Sore Pain Intervention(s): Limited activity within patient's tolerance;Monitored during session;Patient requesting pain meds-RN notified    Home Living                      Prior Function            PT Goals (current goals can now be found in the care plan section) Progress towards PT goals: Progressing toward goals    Frequency  Min 3X/week    PT Plan Current plan remains appropriate    Co-evaluation             End of Session   Activity Tolerance: Patient tolerated treatment well Patient left: in bed;with call bell/phone within reach     Time: 1540-0867 PT Time Calculation (min) (ACUTE ONLY): 25 min  Charges:  $Gait Training: 8-22  mins $Therapeutic Activity: 8-22 mins                    G Codes:      Marissa [name],Marissa [name] 04-08-15, 1:34 PM Carmelia Bake, PT, DPT 2015/04/08 Pager: [phone]

## 2015-03-19 NOTE — Consult Note (Signed)
Reason for Consult:   HTN, chest pain  Requesting Physician: Dr Sheran Fava Primary Cardiologist Dr Aundra Dubin  HPI: This is a 74 y.o. female with a past medical history significant for CAD, s/p OM BMS in Aug 2007 with ISR treated with a DES April 2009. Cath done in Aug 2009 showed patency of the stent. Her last functional study was a Myoview Oct 2015 which was low risk. Echo at that time showed an EF of 55-60%.   She presented 03/13/15 with an abdominal abscess and large enterocutaneous fistula. Her abscess apparently burst on admission. She has been followed by surgery and internal medicine. She is NPO and on TPN.  She has had no Troponin's or BNP drawn. Her CXR does not suggest CHF. EKG on admission showed no acute changes.   She is a poor historian. She said at one point she had sharp chest pain and SOB, then later said she didn't. She appears uncomfortable with NG in place but in NAD.     PMHx:  Past Medical History  Diagnosis Date  . Hypertension   . Renal insufficiency     baseline Cr ~ 1.3  . Hyperlipidemia   . History of DVT (deep vein thrombosis)   . History of colonic polyps   . Gout   . Polyarthritis   . Microcytic anemia   . OSA (obstructive sleep apnea)   . GERD (gastroesophageal reflux disease)   . Esophagitis   . Hidradenitis suppurativa     s/p axillary sweat gland removal  . H/O blood transfusion reaction     at [facility] hospital  . Headache(784.0)   . Diverticulitis   . Difficulty sleeping   . PMB (postmenopausal bleeding)     X 2 YRS  . Bowel trouble     OCCASIONAL BOWEL INCONTINENCE  . Diabetes mellitus without complication     diet controlled  . Fibroids     s/p TAH/BSO 07/2012  . CAD (coronary artery disease)     a. 8/07 had BMS to OM.  b. In-stent restenosis with later Promus DES to same site.  c. Lexiscan myoview in 1/13 showed EF 67%, no ischemia or infarction.  d. Echo (2/13) showed EF 55-60%, moderate LVH, mild MR.  e. Lex MV 6/14: no isch,  EF 53%;  f. Echo 6/14: mild LVH, EF 55-60%, Gr 1 DD, MAC, mild MR, mild LAE  . Peripheral neuropathy   . Chronic diastolic CHF (congestive heart failure) 06/29/2014    Past Surgical History  Procedure Laterality Date  . Cervical fusion c-4-5 other    . Cervical laminectomy other    . Tonsillectomy other    . Umbilical hernia repair    . Axillary seat gland removal other    . Right knee arthroscopy other    . Colectomy  2004    left partial for perforation  . Cholecystectomy    . Tubal ligation    . Ptca and stenting      of mid and distal circumflex coronary artery. 1st stent was 04/2006, with drug eluting stent placed on 12/20/07 (30-50% RCA stenosis). Cardiologist Dr. Einar Gip.  . Dilation and curettage of uterus  02/2011  . Hysteroscopy w/d&c  05/04/2012    Procedure: DILATATION AND CURETTAGE /HYSTEROSCOPY;  Surgeon: Lahoma Crocker, MD;  Location: [facility];  Service: Gynecology;  Laterality: N/A;  . Robotic assisted total hysterectomy with bilateral salpingo oopherectomy  07/24/2012    Procedure: ROBOTIC ASSISTED TOTAL HYSTERECTOMY  WITH BILATERAL SALPINGO OOPHORECTOMY;  Surgeon: Janie Morning, MD PHD;  Location: [facility];  Service: Gynecology;  Laterality: Bilateral;  attempted ,  converted to abdomnial hysterectomy  . Abdominal hysterectomy  07/24/2012    Procedure: HYSTERECTOMY ABDOMINAL;  Surgeon: Janie Morning, MD PHD;  Location: [facility];  Service: Gynecology;;  . Salpingoophorectomy  07/24/2012    Procedure: SALPINGO OOPHORECTOMY;  Surgeon: Janie Morning, MD PHD;  Location: [facility];  Service: Gynecology;  Laterality: Bilateral;  . Abdominal wound dehiscence  08/01/2012    Procedure: ABDOMINAL WOUND DEHISCENCE;  Surgeon: Lahoma Crocker, MD;  Location: [facility];  Service: Gynecology;  Laterality: N/A;  . Laparotomy  08/02/2012    Procedure: EXPLORATORY LAPAROTOMY;  Surgeon: Imogene Burn. Georgette Dover, MD;  Location: [facility];  Service: General;  Laterality: N/A;  placement of abdominal wound vac   . Colonoscopy N/A 12/22/2014    Procedure: COLONOSCOPY;  Surgeon: Juanita Craver, MD;  Location: [facility];  Service: Endoscopy;  Laterality: N/A;  . Esophagogastroduodenoscopy Left 01/13/2015    Procedure: ESOPHAGOGASTRODUODENOSCOPY (EGD);  Surgeon: Carol Ada, MD;  Location: [facility];  Service: Endoscopy;  Laterality: Left;    SOCHx:  reports that she has never smoked. She has never used smokeless tobacco. She reports that she does not drink alcohol or use illicit drugs.  FAMHx: Family History  Problem Relation Age of Onset  . Diabetes Mother   . Coronary artery disease Mother   . Diabetes Sister   . Coronary artery disease Sister     ALLERGIES: No Known Allergies  ROS: see H&P for complete ROS  HOME MEDICATIONS: Prior to Admission medications   Medication Sig Start Date End Date Taking? Authorizing Provider  colchicine 0.6 MG tablet Take 1 tablet (0.6 mg total) by mouth 2 (two) times daily. 12/25/14  Yes Reyne Dumas, MD  cyanocobalamin 2000 MCG tablet Take 1 tablet (2,000 mcg total) by mouth daily. 01/07/15  Yes Janith Lima, MD  furosemide (LASIX) 20 MG tablet Take 20 mg by mouth daily as needed for fluid.  11/03/14  Yes Historical Provider, MD  metoprolol tartrate (LOPRESSOR) 25 MG tablet Take 1 tablet (25 mg total) by mouth every morning. 12/25/14  Yes Reyne Dumas, MD  nitroGLYCERIN (NITROSTAT) 0.4 MG SL tablet Place 1 tablet (0.4 mg total) under the tongue every 5 (five) minutes as needed for chest pain. 02/26/13  Yes Larey Dresser, MD  pantoprazole (PROTONIX) 40 MG tablet Take 1 tablet (40 mg total) by mouth daily. 12/25/14  Yes Reyne Dumas, MD  VOLTAREN 1 % GEL Apply 2 g topically 4 (four) times daily as needed (pain).  12/25/14  Yes Historical Provider, MD  amLODipine (NORVASC) 10 MG tablet Take 1 tablet (10 mg total) by mouth daily. 05/29/14   Janith Lima, MD  atorvastatin (LIPITOR) 40 MG tablet Take 1 tablet (40 mg total) by mouth daily. Patient not taking:  Reported on 03/13/2015 12/25/14   Reyne Dumas, MD  Ferrous Sulfate (IRON) 325 (65 FE) MG TABS Take 325 mg by mouth 2 (two) times daily. Patient not taking: Reported on 03/13/2015 03/05/15   Janith Lima, MD    HOSPITAL MEDICATIONS: I have reviewed the patient's current medications.  VITALS: Blood pressure 141/72, pulse 78, temperature 98.2 F (36.8 C), temperature source Oral, resp. rate 18, height 5\' 4"  (1.626 m), weight 222 lb 10.6 oz (101 kg), SpO2 100 %.  PHYSICAL EXAM: General appearance: alert, cooperative, no distress and moderately obese Neck: no carotid bruit and JVD- mild  Lungs: decreased breath sounds, poor effort Heart: regular rate and rhythm Abdomen: fistula with colostomy bag in place for drainage collection Extremities: no edema Pulses: diminished Skin: cool and dry Neurologic: Grossly normal  LABS: Results for orders placed or performed during the hospital encounter of 03/13/15 (from the past 24 hour(s))  Glucose, capillary     Status: Abnormal   Collection Time: 03/18/15 11:51 AM  Result Value Ref Range   Glucose-Capillary 119 (H) 65 - 99 mg/dL  Glucose, capillary     Status: Abnormal   Collection Time: 03/18/15  6:10 PM  Result Value Ref Range   Glucose-Capillary 186 (H) 65 - 99 mg/dL  Glucose, capillary     Status: Abnormal   Collection Time: 03/18/15 11:52 PM  Result Value Ref Range   Glucose-Capillary 117 (H) 65 - 99 mg/dL  Comprehensive metabolic panel     Status: Abnormal   Collection Time: 03/19/15  5:15 AM  Result Value Ref Range   Sodium 137 135 - 145 mmol/L   Potassium 3.8 3.5 - 5.1 mmol/L   Chloride 106 101 - 111 mmol/L   CO2 24 22 - 32 mmol/L   Glucose, Bld 101 (H) 65 - 99 mg/dL   BUN 35 (H) 6 - 20 mg/dL   Creatinine, Ser 1.16 (H) 0.44 - 1.00 mg/dL   Calcium 9.0 8.9 - 10.3 mg/dL   Total Protein 7.2 6.5 - 8.1 g/dL   Albumin 2.7 (L) 3.5 - 5.0 g/dL   AST 63 (H) 15 - 41 U/L   ALT 66 (H) 14 - 54 U/L   Alkaline Phosphatase 105 38 - 126 U/L    Total Bilirubin 0.3 0.3 - 1.2 mg/dL   GFR calc non Af Amer 45 (L) >60 mL/min   GFR calc Af Amer 52 (L) >60 mL/min   Anion gap 7 5 - 15  Magnesium     Status: None   Collection Time: 03/19/15  5:15 AM  Result Value Ref Range   Magnesium 2.2 1.7 - 2.4 mg/dL  Phosphorus     Status: None   Collection Time: 03/19/15  5:15 AM  Result Value Ref Range   Phosphorus 3.2 2.5 - 4.6 mg/dL  CBC     Status: Abnormal   Collection Time: 03/19/15  5:15 AM  Result Value Ref Range   WBC 11.3 (H) 4.0 - 10.5 K/uL   RBC 4.40 3.87 - 5.11 MIL/uL   Hemoglobin 9.1 (L) 12.0 - 15.0 g/dL   HCT 30.6 (L) 36.0 - 46.0 %   MCV 69.5 (L) 78.0 - 100.0 fL   MCH 20.7 (L) 26.0 - 34.0 pg   MCHC 29.7 (L) 30.0 - 36.0 g/dL   RDW 14.8 11.5 - 15.5 %   Platelets 378 150 - 400 K/uL  Glucose, capillary     Status: None   Collection Time: 03/19/15  6:29 AM  Result Value Ref Range   Glucose-Capillary 98 65 - 99 mg/dL    EKG: today's pending  IMAGING: No results found.  IMPRESSION: Principal Problem:   Enterocutaneous fistula Active Problems:   Abdominal wall abscess   Essential hypertension   CAD S/P OM BMS 8/07, ISR-OM DES 8/09   Chronic kidney disease, stage 3   Chronic diastolic CHF (congestive heart failure)   Gout   OSA (obstructive sleep apnea)   GERD   Hyperlipidemia with target LDL less than 100   Diverticulosis of colon with hemorrhage   Anemia, iron deficiency   RECOMMENDATION: Dr Tamala Julian to  see. Apparently her B/P was elevated this am. She was started on po Norvasc 10 mg, which she was on prior to admission (NG clamped for 30 minutes for this).  She is on IV Lopressor 5 mg Q 6 and prn Hydralazine. Consider resuming home dose (20 mg) Lasix IV though her BUN has been drifting up. Not sure what to make of her history of chest pain compliant. Will review EKG, I did not order Troponin but will if there are EKG changes. Recent echo and Myoview reassuring.   Time Spent Directly with Patient: 50  minutes  Chelbi Herber K [phone] beeper 03/19/2015, 10:01 AM

## 2015-03-19 NOTE — Progress Notes (Signed)
Patient ID: Marissa [name], female   DOB: Feb 05, 1941, 74 y.o.   MRN: [id number]     [facility]      [address] [place], [place] [address]    Phone: [phone] FAX: [phone]     Subjective: Gout is better.  Able to ambulate.  Up to [facility].  No n/v, abd pain.  More talkative today, not optimistic about the surgery. eakin's pouch in place, with milky purulent output 18ms. Having BMs.  Denies dysuria.  Objective:  Vital signs:  Filed Vitals:   03/18/15 1423 03/18/15 2140 03/19/15 0100 03/19/15 0540  BP: 156/75 153/96 190/92 141/72  Pulse: 71 77 72 78  Temp: 97.8 F (36.6 C) 97.7 F (36.5 C) 98.7 F (37.1 C) 98.2 F (36.8 C)  TempSrc: Oral Oral Oral Oral  Resp: _0 Height:      Weight:      SpO2: 100% 100% 100% 100%    Last BM Date: 03/13/15  Intake/Output   Yesterday:  07/06 0701 - 07/07 0700 In: 720 [I.V.:720] Out: 2745 [Urine:2000; Emesis/NG output:700; Drains:40; Stool:5] This shift:    I/O last 3 completed shifts: In: 1080 [I.V.:1080] Out: 44008[Urine:2800; Emesis/NG output:1150; Drains:70; Stool:5] Total I/O In: -  Out: 150 [Urine:150]   Physical Exam: General: Pt awake/alert/oriented x4 in no acute distress Chest: cta. No chest wall pain w good excursion CV: Pulses intact. Regular rhythm MS: Normal AROM mjr joints. No obvious deformity Abdomen: Soft. Nondistended. Non tender. eakin's pouch in place with purulent milky output. No evidence of peritonitis. No incarcerated hernias. Ext: SCDs BLE. No mjr edema. No cyanosis Skin: No petechiae / purpura   Problem List:   Principal Problem:   Enterocutaneous fistula Active Problems:   Gout   OSA (obstructive sleep apnea)   Essential hypertension   Coronary atherosclerosis   Chronic diastolic CHF (congestive heart failure)   Anemia, iron deficiency   Abdominal wall abscess    Results:   Labs: Results for orders placed or  performed during the hospital encounter of 03/13/15 (from the past 48 hour(s))  Glucose, capillary     Status: None   Collection Time: 03/17/15 11:57 AM  Result Value Ref Range   Glucose-Capillary 88 65 - 99 mg/dL  Glucose, capillary     Status: Abnormal   Collection Time: 03/17/15  5:59 PM  Result Value Ref Range   Glucose-Capillary 135 (H) 65 - 99 mg/dL  Glucose, capillary     Status: None   Collection Time: 03/17/15 11:29 PM  Result Value Ref Range   Glucose-Capillary 98 65 - 99 mg/dL  CBC     Status: Abnormal   Collection Time: 03/18/15  4:55 AM  Result Value Ref Range   WBC 11.5 (H) 4.0 - 10.5 K/uL   RBC 4.11 3.87 - 5.11 MIL/uL   Hemoglobin 8.7 (L) 12.0 - 15.0 g/dL   HCT 28.9 (L) 36.0 - 46.0 %   MCV 70.3 (L) 78.0 - 100.0 fL   MCH 21.2 (L) 26.0 - 34.0 pg   MCHC 30.1 30.0 - 36.0 g/dL   RDW 14.8 11.5 - 15.5 %   Platelets 333 150 - 400 K/uL    Comment: REPEATED TO VERIFY  Glucose, capillary     Status: None   Collection Time: 03/18/15  5:27 AM  Result Value Ref Range   Glucose-Capillary 92 65 - 99 mg/dL  Glucose, capillary     Status: Abnormal  Collection Time: 03/18/15 11:51 AM  Result Value Ref Range   Glucose-Capillary 119 (H) 65 - 99 mg/dL  Glucose, capillary     Status: Abnormal   Collection Time: 03/18/15  6:10 PM  Result Value Ref Range   Glucose-Capillary 186 (H) 65 - 99 mg/dL  Glucose, capillary     Status: Abnormal   Collection Time: 03/18/15 11:52 PM  Result Value Ref Range   Glucose-Capillary 117 (H) 65 - 99 mg/dL  Comprehensive metabolic panel     Status: Abnormal   Collection Time: 03/19/15  5:15 AM  Result Value Ref Range   Sodium 137 135 - 145 mmol/L   Potassium 3.8 3.5 - 5.1 mmol/L   Chloride 106 101 - 111 mmol/L   CO2 24 22 - 32 mmol/L   Glucose, Bld 101 (H) 65 - 99 mg/dL   BUN 35 (H) 6 - 20 mg/dL   Creatinine, Ser 1.16 (H) 0.44 - 1.00 mg/dL   Calcium 9.0 8.9 - 10.3 mg/dL   Total Protein 7.2 6.5 - 8.1 g/dL   Albumin 2.7 (L) 3.5 - 5.0 g/dL    AST 63 (H) 15 - 41 U/L   ALT 66 (H) 14 - 54 U/L   Alkaline Phosphatase 105 38 - 126 U/L   Total Bilirubin 0.3 0.3 - 1.2 mg/dL   GFR calc non Af Amer 45 (L) >60 mL/min   GFR calc Af Amer 52 (L) >60 mL/min    Comment: (NOTE) The eGFR has been calculated using the CKD EPI equation. This calculation has not been validated in all clinical situations. eGFR's persistently <60 mL/min signify possible Chronic Kidney Disease.    Anion gap 7 5 - 15  Magnesium     Status: None   Collection Time: 03/19/15  5:15 AM  Result Value Ref Range   Magnesium 2.2 1.7 - 2.4 mg/dL  Phosphorus     Status: None   Collection Time: 03/19/15  5:15 AM  Result Value Ref Range   Phosphorus 3.2 2.5 - 4.6 mg/dL  CBC     Status: Abnormal   Collection Time: 03/19/15  5:15 AM  Result Value Ref Range   WBC 11.3 (H) 4.0 - 10.5 K/uL   RBC 4.40 3.87 - 5.11 MIL/uL   Hemoglobin 9.1 (L) 12.0 - 15.0 g/dL   HCT 30.6 (L) 36.0 - 46.0 %   MCV 69.5 (L) 78.0 - 100.0 fL   MCH 20.7 (L) 26.0 - 34.0 pg   MCHC 29.7 (L) 30.0 - 36.0 g/dL   RDW 14.8 11.5 - 15.5 %   Platelets 378 150 - 400 K/uL    Comment: REPEATED TO VERIFY  Glucose, capillary     Status: None   Collection Time: 03/19/15  6:29 AM  Result Value Ref Range   Glucose-Capillary 98 65 - 99 mg/dL    Imaging / Studies: No results found.  Medications / Allergies:  Scheduled Meds: . colchicine  0.6 mg Oral BID  . diclofenac sodium  4 g Topical QID  . ertapenem  1 g Intravenous Q24H  . heparin subcutaneous  5,000 Units Subcutaneous 3 times per day  . insulin aspart  0-9 Units Subcutaneous 4 times per day  . methylPREDNISolone (SOLU-MEDROL) injection  20 mg Intravenous Daily  . metoprolol  5 mg Intravenous 4 times per day  . pantoprazole (PROTONIX) IV  40 mg Intravenous QHS  . sodium chloride  10-40 mL Intracatheter Q12H   Continuous Infusions: . Marland KitchenTPN (CLINIMIX-E) Adult 83 mL/hr at  03/18/15 1729   And  . fat emulsion 240 mL (03/18/15 1729)  . sodium chloride 30  mL/hr at 03/17/15 1810   PRN Meds:.acetaminophen **OR** acetaminophen, diphenhydrAMINE **OR** diphenhydrAMINE, hydrALAZINE, morphine injection, nitroGLYCERIN, ondansetron, sodium chloride  Antibiotics: Anti-infectives    Start     Dose/Rate Route Frequency Ordered Stop   03/13/15 2000  ertapenem (INVANZ) 1 g in sodium chloride 0.9 % 50 mL IVPB     1 g 100 mL/hr over 30 Minutes Intravenous Every 24 hours 03/13/15 1829          Assessment/Plan Enterocutaneous fistula with large abdominal wall abscess -H/o colon resection, hysterectomy and urgent laparotomy for abscess presumed EC fistula -NGT, bowel rest, TNA.  -Concern that she had mesh and she has infected mesh, but no evidence of mesh insertion in Epic -Abscess spontaneously drained and well controlled with eakin's pouch (to be changed M/W/F). -Encouraged ambulation PCM/TNA ID-Invanz D# for abdominal wall abscess VTE prophylaxis-heparin/SCD CKD-stable Gout-stop steroids today.  C/w colchicine  CV-Hx CAD, HTN-.metoprolol, add amlodipine, clamp NGT after giving.  Will ask cards to see pre op OSA-CPAP H/o diagnosis of DVT- SCD's and heparin /Diabetes mellitus-diet controlled. A1c  5.7%.  SSI for TPN.  Anemia of chronic disease-stable Dispo-Continue inpatient.Plan for CT with injection to evaluate fistula on Saturday and surgery next week with Dr. Hassell Done.   Erby Pian, [facility] Pager [phone](7A-4:30P)   03/19/2015 9:21 AM

## 2015-03-20 ENCOUNTER — Inpatient Hospital Stay (HOSPITAL_COMMUNITY): Payer: Medicare Other

## 2015-03-20 DIAGNOSIS — I10 Essential (primary) hypertension: Secondary | ICD-10-CM

## 2015-03-20 LAB — COMPREHENSIVE METABOLIC PANEL
ALBUMIN: 2.4 g/dL — AB (ref 3.5–5.0)
ALK PHOS: 92 U/L (ref 38–126)
ALT: 48 U/L (ref 14–54)
AST: 36 U/L (ref 15–41)
Anion gap: 5 (ref 5–15)
BUN: 37 mg/dL — ABNORMAL HIGH (ref 6–20)
CHLORIDE: 106 mmol/L (ref 101–111)
CO2: 25 mmol/L (ref 22–32)
CREATININE: 1.18 mg/dL — AB (ref 0.44–1.00)
Calcium: 8.7 mg/dL — ABNORMAL LOW (ref 8.9–10.3)
GFR, EST AFRICAN AMERICAN: 51 mL/min — AB (ref >60)
GFR, EST NON AFRICAN AMERICAN: 44 mL/min — AB (ref >60)
GLUCOSE: 108 mg/dL — AB (ref 65–99)
POTASSIUM: 3.9 mmol/L (ref 3.5–5.1)
Sodium: 136 mmol/L (ref 135–145)
Total Bilirubin: 0.4 mg/dL (ref 0.3–1.2)
Total Protein: 6.7 g/dL (ref 6.5–8.1)

## 2015-03-20 LAB — GLUCOSE, CAPILLARY
GLUCOSE-CAPILLARY: 101 mg/dL — AB (ref 65–99)
Glucose-Capillary: 91 mg/dL (ref 65–99)
Glucose-Capillary: 114 mg/dL — ABNORMAL HIGH (ref 65–99)

## 2015-03-20 LAB — CBC
HCT: 27.5 % — ABNORMAL LOW (ref 36.0–46.0)
Hemoglobin: 8.3 g/dL — ABNORMAL LOW (ref 12.0–15.0)
MCH: 21.1 pg — ABNORMAL LOW (ref 26.0–34.0)
MCHC: 30.2 g/dL (ref 30.0–36.0)
MCV: 70 fL — AB (ref 78.0–100.0)
Platelets: 292 10*3/uL (ref 150–400)
RBC: 3.93 MIL/uL (ref 3.87–5.11)
RDW: 15 % (ref 11.5–15.5)
WBC: 10.4 10*3/uL (ref 4.0–10.5)

## 2015-03-20 MED ORDER — FAT EMULSION 20 % IV EMUL
240.0000 mL | INTRAVENOUS | Status: AC
  Administered 2015-03-20: 240 mL via INTRAVENOUS
  Filled 2015-03-20: qty 250

## 2015-03-20 MED ORDER — IOHEXOL 300 MG/ML  SOLN
25.0000 mL | INTRAMUSCULAR | Status: AC
  Administered 2015-03-20 (×2): 25 mL via ORAL

## 2015-03-20 MED ORDER — INSULIN ASPART 100 UNIT/ML ~~LOC~~ SOLN
0.0000 [IU] | Freq: Three times a day (TID) | SUBCUTANEOUS | Status: DC
  Administered 2015-03-25: 1 [IU] via SUBCUTANEOUS

## 2015-03-20 MED ORDER — TRACE MINERALS CR-CU-MN-SE-ZN 10-1000-500-60 MCG/ML IV SOLN
INTRAVENOUS | Status: AC
  Administered 2015-03-20: 18:00:00 via INTRAVENOUS
  Filled 2015-03-20: qty 1992

## 2015-03-20 NOTE — Progress Notes (Signed)
Subjective:  Awake and alert, no complaints of chest pain or SOB  Objective:  Vital Signs in the last 24 hours: Temp:  [97.6 F (36.4 C)-98.2 F (36.8 C)] 98.1 F (36.7 C) (07/08 0610) Pulse Rate:  [76-85] 78 (07/08 0610) Resp:  [18] 18 (07/08 0610) BP: (130-168)/(51-125) 150/64 mmHg (07/08 0610) SpO2:  [100 %] 100 % (07/08 0610)  Intake/Output from previous day:  Intake/Output Summary (Last 24 hours) at 03/20/15 0746 Last data filed at 03/20/15 0600  Gross per 24 hour  Intake    305 ml  Output   2360 ml  Net  -2055 ml    Physical Exam: General appearance: alert, cooperative, no distress, mildly obese and NG in place Lungs: clear to auscultation bilaterally Heart: regular rate and rhythm Abdomen: enterocutaneous fistula with colostomy bag   Rate: 78   Lab Results:  Recent Labs  03/19/15 0515 03/20/15 0455  WBC 11.3* 10.4  HGB 9.1* 8.3*  PLT 378 292    Recent Labs  03/19/15 0515 03/20/15 0455  NA 137 136  K 3.8 3.9  CL 106 106  CO2 24 25  GLUCOSE 101* 108*  BUN 35* 37*  CREATININE 1.16* 1.18*    Recent Labs  03/19/15 1235 03/19/15 1802  TROPONINI <0.03 <0.03   No results for input(s): INR in the last 72 hours.  Scheduled Meds: . amLODipine  10 mg Oral Daily  . colchicine  0.6 mg Oral BID  . diclofenac sodium  4 g Topical QID  . ertapenem  1 g Intravenous Q24H  . heparin subcutaneous  5,000 Units Subcutaneous 3 times per day  . insulin aspart  0-9 Units Subcutaneous 4 times per day  . metoprolol  5 mg Intravenous 4 times per day  . pantoprazole (PROTONIX) IV  40 mg Intravenous QHS  . sodium chloride  10-40 mL Intracatheter Q12H   Continuous Infusions: . Marland KitchenTPN (CLINIMIX-E) Adult 83 mL/hr at 03/19/15 1731   And  . fat emulsion 240 mL (03/19/15 1732)  . sodium chloride 10 mL/hr at 03/19/15 1000   PRN Meds:.acetaminophen **OR** acetaminophen, diphenhydrAMINE **OR** diphenhydrAMINE, hydrALAZINE, morphine injection, nitroGLYCERIN,  ondansetron, sodium chloride   Imaging: Imaging results have been reviewed   Assessment/Plan:  74 y.o. female with a past medical history significant for CAD, s/p OM BMS in Aug 2007 with ISR treated with a DES April 2009. Cath done in Aug 2009 showed patency of the stent. Her last functional study was a Myoview Oct 2015 which was low risk. Echo at that time showed an EF of 55-60%.   She presented 03/13/15 with an abdominal abscess and large enterocutaneous fistula. Her abscess apparently burst on admission. She has been followed by surgery and internal medicine. She is NPO and on TPN.She was seen by Dr Tamala Julian 03/19/15 secondary to c/o chest pain and elevated B/P. EKG without acute changes and Troponin negative. HTN under better control.   Principal Problem:   Enterocutaneous fistula Active Problems:   Abdominal wall abscess   Essential hypertension   CAD S/P OM BMS 8/07, ISR-OM DES 8/09   Chronic kidney disease, stage 3   Chronic diastolic CHF (congestive heart failure)   Gout   OSA (obstructive sleep apnea)   GERD   Hyperlipidemia with target LDL less than 100   Diverticulosis of colon with hemorrhage   Anemia, iron deficiency   PLAN: Cleared for surgery without further cardiac testing. We will be available as needed.   [employer] PA-C 03/20/2015,  7:46 AM [phone]

## 2015-03-20 NOTE — Progress Notes (Signed)
Nutrition Follow-up  DOCUMENTATION CODES:  Obesity unspecified  INTERVENTION: - Continue TPN per pharmacy - RD will continue to monitor for needs  NUTRITION DIAGNOSIS:  Inadequate oral intake related to inability to eat as evidenced by NPO status. -ongoing for NPO status  GOAL:  Patient will meet greater than or equal to 90% of their needs -met with TPN  MONITOR:  Labs, Weight trends, Skin, I & O's, Other (Comment) (TPN tolerance)  ASSESSMENT: 74 year old female with history of CAD s/p multiple stents, chronic diastolic heart failure, diabetes mellitus with peripheral neuropathy, DVT no longer on anticoagulation secondary to recurrent diverticular and gastric bleeding, gout, obstructive sleep apnea, and multiple abdominal surgeries.She presented with abdominal swelling, erythema, pain, warmth, and anorexia.  7/8 - Pt continues to be NPO - Clinimix E 5/15 @ 83 mL/hr with 20% lipids @ 10 mL/hr providing 100 grams of protein and 1900 kcal - NGT to suction with <50cc clear, very light yellow drainage at time of visit - Per surgery note this AM:     Plan fistulogram to see if we still have communication with the small bowel     Likely that she will need to go to the operating room next week for laparotomy, small     bowel resection, and repair of abdominal wall defect.  Per pharmacy today: 1. At 1800 today:  Continue Clinimix E 5/15 at 83 ml/hr (goal rate).  Continue 20% fat emulsion at 10 ml/hr.  TPN to contain standard multivitamins and trace elements.  Continue NS at [facility]. 2. Adjust CBG checks and SSI to q8h.  3. TPN lab panels on Mondays & Thursdays.   Medications reviewed. Labs reviewed; CBGs: 91-186 mg/dL, BUN/creatinine elevated, GFR: 51.   7/5 - Pt tolerating TPN; still NPO - Clinimix E 5/15 at a goal rate of 83 ml/hr + 20% fat emulsion at 10 ml/hr will provide: approximately 100 g/day protein, 1900 Kcal/day.   Height:  Ht Readings from Last 1  Encounters:  03/14/15 _0  (1.626 m)    Weight:  Wt Readings from Last 1 Encounters:  03/14/15 222 lb 10.6 oz (101 kg)    Ideal Body Weight:  54.5 kg  Wt Readings from Last 10 Encounters:  03/14/15 222 lb 10.6 oz (101 kg)  03/05/15 219 lb (99.338 kg)  01/14/15 223 lb (101.152 kg)  01/07/15 216 lb (97.977 kg)  12/29/14 229 lb 11.5 oz (104.2 kg)  12/20/14 226 lb 13.7 oz (102.9 kg)  11/26/14 227 lb 4 oz (103.08 kg)  07/21/14 234 lb 6.4 oz (106.323 kg)  07/04/14 230 lb (104.327 kg)  07/01/14 230 lb (104.327 kg)    BMI:  Body mass index is 38.2 kg/(m^2).  Estimated Nutritional Needs:  Kcal:  1650-1850  Protein:  85-100g  Fluid:  1.7L/day  Skin:  Wound (see comment) (abdominal puncture)  Diet Order:  Diet NPO time specified .TPN (CLINIMIX-E) Adult .TPN (CLINIMIX-E) Adult  EDUCATION NEEDS:  No education needs identified at this time   Intake/Output Summary (Last 24 hours) at 03/20/15 1152 Last data filed at 03/20/15 0807  Gross per 24 hour  Intake    185 ml  Output   2511 ml  Net  -2326 ml    Last BM:  7/7    Jarome Matin, RD, LDN Inpatient Clinical Dietitian Pager # [phone] After hours/weekend pager # [phone]

## 2015-03-20 NOTE — Progress Notes (Signed)
Patient ID: Marissa [name], female   DOB: 04/22/1941, 74 y.o.   MRN: [id number]     [facility]      [address] [place], [place] [address]    Phone: [phone] FAX: [phone]     Subjective: No changes.  BP up, but better. Having BMS.  No n/v.   Objective:  Vital signs:  Filed Vitals:   03/19/15 1430 03/19/15 2114 03/19/15 2333 03/20/15 0610  BP: 132/68 130/66 168/87 150/64  Pulse:  77 76 78  Temp:  98.2 F (36.8 C) 97.6 F (36.4 C) 98.1 F (36.7 C)  TempSrc:  Oral Oral Oral  Resp:  _0 Height:      Weight:      SpO2:  100% 100% 100%    Last BM Date: 03/19/15  Intake/Output   Yesterday:  07/07 0701 - 07/08 0700 In: 305 [I.V.:290; NG/GT:15] Out: 2360 [Urine:2250; Drains:110] This shift:  Total I/O In: -  Out: 301 [Urine:300; Stool:1]    Physical Exam: General: Pt awake/alert/oriented x4 in no acute distress Chest: cta. No chest wall pain w good excursion CV: Pulses intact. Regular rhythm MS: Normal AROM mjr joints. No obvious deformity Abdomen: Soft. Nondistended. Non tender. eakin's pouch in place with purulent milky output. No evidence of peritonitis. No incarcerated hernias. Ext: SCDs BLE. No mjr edema. No cyanosis Skin: No petechiae / purpura  Problem List:   Principal Problem:   Enterocutaneous fistula Active Problems:   Gout   OSA (obstructive sleep apnea)   Essential hypertension   CAD S/P OM BMS 8/07, ISR-OM DES 8/09   GERD   Chronic kidney disease, stage 3   Hyperlipidemia with target LDL less than 100   Chronic diastolic CHF (congestive heart failure)   Diverticulosis of colon with hemorrhage   Anemia, iron deficiency   Abdominal wall abscess    Results:   Labs: Results for orders placed or performed during the hospital encounter of 03/13/15 (from the past 48 hour(s))  Glucose, capillary     Status: Abnormal   Collection Time: 03/18/15 11:51 AM  Result Value Ref  Range   Glucose-Capillary 119 (H) 65 - 99 mg/dL  Glucose, capillary     Status: Abnormal   Collection Time: 03/18/15  6:10 PM  Result Value Ref Range   Glucose-Capillary 186 (H) 65 - 99 mg/dL  Glucose, capillary     Status: Abnormal   Collection Time: 03/18/15 11:52 PM  Result Value Ref Range   Glucose-Capillary 117 (H) 65 - 99 mg/dL  Comprehensive metabolic panel     Status: Abnormal   Collection Time: 03/19/15  5:15 AM  Result Value Ref Range   Sodium 137 135 - 145 mmol/L   Potassium 3.8 3.5 - 5.1 mmol/L   Chloride 106 101 - 111 mmol/L   CO2 24 22 - 32 mmol/L   Glucose, Bld 101 (H) 65 - 99 mg/dL   BUN 35 (H) 6 - 20 mg/dL   Creatinine, Ser 1.16 (H) 0.44 - 1.00 mg/dL   Calcium 9.0 8.9 - 10.3 mg/dL   Total Protein 7.2 6.5 - 8.1 g/dL   Albumin 2.7 (L) 3.5 - 5.0 g/dL   AST 63 (H) 15 - 41 U/L   ALT 66 (H) 14 - 54 U/L   Alkaline Phosphatase 105 38 - 126 U/L   Total Bilirubin 0.3 0.3 - 1.2 mg/dL   GFR calc non Af Amer 45 (L) >60 mL/min  GFR calc Af Amer 52 (L) >60 mL/min    Comment: (NOTE) The eGFR has been calculated using the CKD EPI equation. This calculation has not been validated in all clinical situations. eGFR's persistently <60 mL/min signify possible Chronic Kidney Disease.    Anion gap 7 5 - 15  Magnesium     Status: None   Collection Time: 03/19/15  5:15 AM  Result Value Ref Range   Magnesium 2.2 1.7 - 2.4 mg/dL  Phosphorus     Status: None   Collection Time: 03/19/15  5:15 AM  Result Value Ref Range   Phosphorus 3.2 2.5 - 4.6 mg/dL  CBC     Status: Abnormal   Collection Time: 03/19/15  5:15 AM  Result Value Ref Range   WBC 11.3 (H) 4.0 - 10.5 K/uL   RBC 4.40 3.87 - 5.11 MIL/uL   Hemoglobin 9.1 (L) 12.0 - 15.0 g/dL   HCT 30.6 (L) 36.0 - 46.0 %   MCV 69.5 (L) 78.0 - 100.0 fL   MCH 20.7 (L) 26.0 - 34.0 pg   MCHC 29.7 (L) 30.0 - 36.0 g/dL   RDW 14.8 11.5 - 15.5 %   Platelets 378 150 - 400 K/uL    Comment: REPEATED TO VERIFY  Glucose, capillary     Status:  None   Collection Time: 03/19/15  6:29 AM  Result Value Ref Range   Glucose-Capillary 98 65 - 99 mg/dL  Glucose, capillary     Status: Abnormal   Collection Time: 03/19/15 11:44 AM  Result Value Ref Range   Glucose-Capillary 116 (H) 65 - 99 mg/dL  Troponin I     Status: None   Collection Time: 03/19/15 12:35 PM  Result Value Ref Range   Troponin I <0.03 <0.031 ng/mL    Comment:        NO INDICATION OF MYOCARDIAL INJURY.   Brain natriuretic peptide     Status: Abnormal   Collection Time: 03/19/15  1:25 PM  Result Value Ref Range   B Natriuretic Peptide 141.6 (H) 0.0 - 100.0 pg/mL  Troponin I     Status: None   Collection Time: 03/19/15  6:02 PM  Result Value Ref Range   Troponin I <0.03 <0.031 ng/mL    Comment:        NO INDICATION OF MYOCARDIAL INJURY.   Glucose, capillary     Status: Abnormal   Collection Time: 03/19/15  6:30 PM  Result Value Ref Range   Glucose-Capillary 127 (H) 65 - 99 mg/dL  Glucose, capillary     Status: None   Collection Time: 03/19/15 11:34 PM  Result Value Ref Range   Glucose-Capillary 94 65 - 99 mg/dL  Comprehensive metabolic panel     Status: Abnormal   Collection Time: 03/20/15  4:55 AM  Result Value Ref Range   Sodium 136 135 - 145 mmol/L   Potassium 3.9 3.5 - 5.1 mmol/L   Chloride 106 101 - 111 mmol/L   CO2 25 22 - 32 mmol/L   Glucose, Bld 108 (H) 65 - 99 mg/dL   BUN 37 (H) 6 - 20 mg/dL   Creatinine, Ser 1.18 (H) 0.44 - 1.00 mg/dL   Calcium 8.7 (L) 8.9 - 10.3 mg/dL   Total Protein 6.7 6.5 - 8.1 g/dL   Albumin 2.4 (L) 3.5 - 5.0 g/dL   AST 36 15 - 41 U/L   ALT 48 14 - 54 U/L   Alkaline Phosphatase 92 38 - 126 U/L   Total  Bilirubin 0.4 0.3 - 1.2 mg/dL   GFR calc non Af Amer 44 (L) >60 mL/min   GFR calc Af Amer 51 (L) >60 mL/min    Comment: (NOTE) The eGFR has been calculated using the CKD EPI equation. This calculation has not been validated in all clinical situations. eGFR's persistently <60 mL/min signify possible Chronic  Kidney Disease.    Anion gap 5 5 - 15  CBC     Status: Abnormal   Collection Time: 03/20/15  4:55 AM  Result Value Ref Range   WBC 10.4 4.0 - 10.5 K/uL   RBC 3.93 3.87 - 5.11 MIL/uL   Hemoglobin 8.3 (L) 12.0 - 15.0 g/dL   HCT 27.5 (L) 36.0 - 46.0 %   MCV 70.0 (L) 78.0 - 100.0 fL   MCH 21.1 (L) 26.0 - 34.0 pg   MCHC 30.2 30.0 - 36.0 g/dL   RDW 15.0 11.5 - 15.5 %   Platelets 292 150 - 400 K/uL    Comment: REPEATED TO VERIFY SPECIMEN CHECKED FOR CLOTS   Glucose, capillary     Status: None   Collection Time: 03/20/15  6:08 AM  Result Value Ref Range   Glucose-Capillary 91 65 - 99 mg/dL    Imaging / Studies: No results found.  Medications / Allergies:  Scheduled Meds: . amLODipine  10 mg Oral Daily  . colchicine  0.6 mg Oral BID  . diclofenac sodium  4 g Topical QID  . ertapenem  1 g Intravenous Q24H  . heparin subcutaneous  5,000 Units Subcutaneous 3 times per day  . insulin aspart  0-9 Units Subcutaneous 4 times per day  . metoprolol  5 mg Intravenous 4 times per day  . pantoprazole (PROTONIX) IV  40 mg Intravenous QHS  . sodium chloride  10-40 mL Intracatheter Q12H   Continuous Infusions: . Marland KitchenTPN (CLINIMIX-E) Adult 83 mL/hr at 03/19/15 1731   And  . fat emulsion 240 mL (03/19/15 1732)  . sodium chloride 10 mL/hr at 03/19/15 1000   PRN Meds:.acetaminophen **OR** acetaminophen, diphenhydrAMINE **OR** diphenhydrAMINE, hydrALAZINE, morphine injection, nitroGLYCERIN, ondansetron, sodium chloride  Antibiotics: Anti-infectives    Start     Dose/Rate Route Frequency Ordered Stop   03/13/15 2000  ertapenem (INVANZ) 1 g in sodium chloride 0.9 % 50 mL IVPB     1 g 100 mL/hr over 30 Minutes Intravenous Every 24 hours 03/13/15 1829         Assessment/Plan Enterocutaneous fistula with large abdominal wall abscess -H/o colon resection, hysterectomy and urgent laparotomy for abscess presumed EC fistula -NGT, bowel rest, TNA.  -Concern that she had mesh and she has  infected mesh, but no evidence of mesh insertion in Epic -Abscess spontaneously drained and well controlled with eakin's pouch (to be changed M/W/F). -Encouraged ambulation -CT tomorrow with injection for fistula.  PCM/TNA ID-Invanz D#7 for abdominal wall abscess VTE prophylaxis-heparin/SCD CKD-stable Gout-stop steroids 7/7.  C/w colchicine  CV-Hx CAD, HTN-.metoprolol, amlodipine, clamp NGT after giving. cards cleared for surgery.  OSA-CPAP H/o diagnosis of DVT- SCD's and heparin Diabetes mellitus-diet controlled. A1c 5.7%. SSI for TPN.  Anemia of chronic disease-stable Dispo-Continue inpatient.Plan for CT with injection to evaluate fistula on Saturday and surgery next week with Dr. Hassell Done.    Erby Pian, [facility] Pager [phone])   03/20/2015 9:55 AM

## 2015-03-20 NOTE — Progress Notes (Signed)
PARENTERAL NUTRITION CONSULT NOTE - FOLLOW UP  Pharmacy Consult for TPN Indication: Enterocutaneous fistula  No Known Allergies  Patient Measurements: Height: 5\' 4"  (162.6 cm) Weight: 222 lb 10.6 oz (101 kg) IBW/kg (Calculated) : 54.7 Adjusted Body Weight: 70 kg Usual Weight: 101 kg  Vital Signs: Temp: 98.1 F (36.7 C) (07/08 0610) Temp Source: Oral (07/08 0610) BP: 150/64 mmHg (07/08 0610) Pulse Rate: 78 (07/08 0610) Intake/Output from previous day: 07/07 0701 - 07/08 0700 In: 305 [I.V.:290; NG/GT:15] Out: 2360 [Urine:2250; Drains:110] Intake/Output from this shift: Total I/O In: -  Out: 301 [Urine:300; Stool:1]  Labs:  Recent Labs  03/18/15 0455 03/19/15 0515 03/20/15 0455  WBC 11.5* 11.3* 10.4  HGB 8.7* 9.1* 8.3*  HCT 28.9* 30.6* 27.5*  PLT 333 378 292     Recent Labs  03/19/15 0515 03/20/15 0455  NA 137 136  K 3.8 3.9  CL 106 106  CO2 24 25  GLUCOSE 101* 108*  BUN 35* 37*  CREATININE 1.16* 1.18*  CALCIUM 9.0 8.7*  MG 2.2  --   PHOS 3.2  --   PROT 7.2 6.7  ALBUMIN 2.7* 2.4*  AST 63* 36  ALT 66* 48  ALKPHOS 105 92  BILITOT 0.3 0.4   Estimated Creatinine Clearance: 48.3 mL/min (by C-G formula based on Cr of 1.18).    Recent Labs  03/19/15 1830 03/19/15 2334 03/20/15 0608  GLUCAP 127* 94 91    Insulin Requirements: sensitive scale Novolog q6h, 1 unit administered in past 24 hours  Current Nutrition: NPO  IVF: NS @ [place] access: 7/3 TPN start date: 7/3  ASSESSMENT  HPI: [name] admitted 7/1 with abdominal pain. Per CT: mesh erosion (previous hernia repair) into small bowel with abscess and EC fistula. Bowel rest planned, TPN initiated with pharmacy dosing assistance requested.  Per surgery, likely that she will need to go to the operating room next week for laparotomy, small bowel resection, and repair of abdominal wall  defect.  Significant events:  7/2: unable to place PICC; TPN postponed 7/3: TPN started 7/4: Feraheme 510 mg IV x 1 administered for iron-deficiency anemia. 7/5: Patient accidentally removed NGT during sleep early this AM; reinserted by RN.  Today, 03/20/2015:   Glucose - controlled, at goal <150 mg/dL  Electrolytes -  All lytes, including Corrected Ca, WNL  Renal - SCr improved since admission (baseline?), CrCl~48 ml/min.  LFTs - WNL today  TGs - WNL, 82 (7/4)  Prealbumin - baseline 5.5 (7/3)  NUTRITIONAL GOALS  RD recommendations from 7/5: 1650 - 1850 KCal/day 85-100 grams protein//day  Clinimix E 5/15 at a goal rate of 83 ml/hr + 20% fat emulsion at 10 ml/hr will provide: 100 g/day protein, 1894 Kcal/day.  PLAN  1. At 1800 today:  Continue Clinimix E 5/15 at 83 ml/hr (goal rate).  Continue 20% fat emulsion at 10 ml/hr.  TPN to contain standard multivitamins and trace elements.  Continue NS at [facility].  2. Adjust CBG checks and SSI to q8h.  3. TPN lab panels on Mondays & Thursdays.  4. Follow clinical course daily.   Hershal Coria, PharmD, BCPS Pager: [phone] 03/20/2015 11:01 AM

## 2015-03-20 NOTE — Consult Note (Addendum)
WOC wound follow up Wound type:EC fistula Measurement: Wound bed:3.4 inches round with closed wound edges, Flush with skin Drainage (amount, consistency, odor) tan, thick Periwound:intact, dry Dressing procedure/placement/frequency:Fistula pouching system changed; skin barrier ring used around fistula.  Eakin pouch used to contain effluent is effective. Additional supply at bedside. While M/W/F changes of fistula pouching system are ordered, nursing staff is independent in the care of this fistula and will change pouching system as indicated for leakage. [place] nursing team will follow weekly, and will remain available to this patient, the nursing, surgical and medical teams.   Thanks, Maudie Flakes, MSN, RN, [place], [place], [place] [phone])

## 2015-03-21 LAB — GLUCOSE, CAPILLARY
GLUCOSE-CAPILLARY: 106 mg/dL — AB (ref 65–99)
GLUCOSE-CAPILLARY: 106 mg/dL — AB (ref 65–99)
Glucose-Capillary: 106 mg/dL — ABNORMAL HIGH (ref 65–99)
Glucose-Capillary: 107 mg/dL — ABNORMAL HIGH (ref 65–99)

## 2015-03-21 MED ORDER — FAT EMULSION 20 % IV EMUL
240.0000 mL | INTRAVENOUS | Status: AC
  Administered 2015-03-21: 240 mL via INTRAVENOUS
  Filled 2015-03-21: qty 250

## 2015-03-21 MED ORDER — TRACE MINERALS CR-CU-MN-SE-ZN 10-1000-500-60 MCG/ML IV SOLN
INTRAVENOUS | Status: AC
  Administered 2015-03-21: 17:00:00 via INTRAVENOUS
  Filled 2015-03-21: qty 1992

## 2015-03-21 NOTE — Progress Notes (Signed)
PARENTERAL NUTRITION CONSULT NOTE - FOLLOW UP  Pharmacy Consult for TPN Indication: Enterocutaneous fistula  No Known Allergies  Patient Measurements: Height: 5\' 4"  (162.6 cm) Weight: 222 lb 10.6 oz (101 kg) IBW/kg (Calculated) : 54.7 Adjusted Body Weight: 70 kg Usual Weight: 101 kg  Vital Signs: Temp: 97.9 F (36.6 C) (07/09 0614) Temp Source: Oral (07/09 0093) BP: 118/58 mmHg (07/09 8182) Pulse Rate: 79 (07/09 0614) Intake/Output from previous day: 07/08 0701 - 07/09 0700 In: 1856 [I.V.:240; NG/GT:500; TPN:1116] Out: 9937 [Urine:3400; Drains:55; Stool:8] Intake/Output from this shift:    Labs:  Recent Labs  03/19/15 0515 03/20/15 0455  WBC 11.3* 10.4  HGB 9.1* 8.3*  HCT 30.6* 27.5*  PLT 378 292     Recent Labs  03/19/15 0515 03/20/15 0455  NA 137 136  K 3.8 3.9  CL 106 106  CO2 24 25  GLUCOSE 101* 108*  BUN 35* 37*  CREATININE 1.16* 1.18*  CALCIUM 9.0 8.7*  MG 2.2  --   PHOS 3.2  --   PROT 7.2 6.7  ALBUMIN 2.7* 2.4*  AST 63* 36  ALT 66* 48  ALKPHOS 105 92  BILITOT 0.3 0.4   Estimated Creatinine Clearance: 48.3 mL/min (by C-G formula based on Cr of 1.18).    Recent Labs  03/20/15 1546 03/21/15 0015 03/21/15 0747  GLUCAP 114* 107* 106*    Insulin Requirements: sensitive scale Novolog q6h, 1 unit administered in past 24 hours  Current Nutrition: NPO  IVF: NS @ [place] access: 7/3 TPN start date: 7/3  ASSESSMENT  HPI: [name] admitted 7/1 with abdominal pain. Per CT: mesh erosion (previous hernia repair) into small bowel with abscess and EC fistula. Bowel rest planned, TPN initiated with pharmacy dosing assistance requested.  Per surgery, likely that she will need to go to the operating room next week for laparotomy, small bowel resection, and repair of abdominal wall defect.  Significant events:  7/2: unable to place PICC; TPN  postponed 7/3: TPN started 7/4: Feraheme 510 mg IV x 1 administered for iron-deficiency anemia. 7/5: Patient accidentally removed NGT during sleep early this AM; reinserted by RN.  Today, 03/21/2015:Note no labs 7/9, below are 7/8 labs  Glucose - controlled, at goal <150 mg/dL  Electrolytes -  All lytes, including Corrected Ca, WNL  Renal - SCr improved since admission (baseline?), CrCl~48 ml/min.  LFTs - WNL today  TGs - WNL, 82 (7/4)  Prealbumin - baseline 5.5 (7/3)  NUTRITIONAL GOALS  RD recommendations from 7/5: 1650 - 1850 KCal/day 85-100 grams protein//day  Clinimix E 5/15 at a goal rate of 83 ml/hr + 20% fat emulsion at 10 ml/hr will provide: 100 g/day protein, 1894 Kcal/day.  PLAN  1. At 1800 today:  Continue Clinimix E 5/15 at 83 ml/hr (goal rate).  Continue 20% fat emulsion at 10 ml/hr.  TPN to contain standard multivitamins and trace elements.  Continue NS at [facility]  2. Adjust CBG checks and SSI to q8h.  3. TPN lab panels on Mondays & Thursdays.  4. Follow clinical course daily.   Adrian Saran, PharmD, BCPS Pager [phone] 03/21/2015 8:04 AM

## 2015-03-21 NOTE — Progress Notes (Signed)
Patient ID: Marissa [name], female   DOB: [birth year], 74 y.o.   MRN: [id number]     [facility]      [address] [place], [place] [address]    Phone: [phone] FAX: [phone]     Subjective: No changes.  BP better.   No n/v.   Objective:  Vital signs:  Filed Vitals:   03/20/15 1336 03/20/15 1800 03/20/15 2147 03/21/15 0614  BP: 135/67 138/87 128/56 118/58  Pulse: 77 76 73 79  Temp: 97.8 F (36.6 C)  97.5 F (36.4 C) 97.9 F (36.6 C)  TempSrc: Oral  Oral Oral  Resp: $Remo'16  16 16  'miroI$ Height:      Weight:      SpO2: 100%  100% 100%    Last BM Date: 03/20/15  Intake/Output   Yesterday:  07/08 0701 - 07/09 0700 In: 1856 [I.V.:240; NG/GT:500; TPN:1116] Out: 1657 [Urine:3400; Drains:55; Stool:8]   Physical Exam: General: Pt awake/alert/oriented x4 in no acute distress MS: Normal AROM mjr joints. No obvious deformity Abdomen: Soft. Nondistended. Non tender. eakin's pouch in place with purulent milky output. No evidence of peritonitis. No incarcerated hernias. Ext: SCDs BLE. No mjr edema. No cyanosis Skin: No petechiae / purpura  Problem List:   Principal Problem:   Enterocutaneous fistula Active Problems:   Gout   OSA (obstructive sleep apnea)   Essential hypertension   CAD S/P OM BMS 8/07, ISR-OM DES 8/09   GERD   Chronic kidney disease, stage 3   Hyperlipidemia with target LDL less than 100   Chronic diastolic CHF (congestive heart failure)   Diverticulosis of colon with hemorrhage   Anemia, iron deficiency   Abdominal wall abscess    Results:   Labs: Results for orders placed or performed during the hospital encounter of 03/13/15 (from the past 48 hour(s))  Glucose, capillary     Status: Abnormal   Collection Time: 03/19/15 11:44 AM  Result Value Ref Range   Glucose-Capillary 116 (H) 65 - 99 mg/dL  Troponin I     Status: None   Collection Time: 03/19/15 12:35 PM  Result Value Ref Range   Troponin I <0.03 <0.031 ng/mL    Comment:        NO INDICATION OF MYOCARDIAL INJURY.   Brain natriuretic peptide     Status: Abnormal   Collection Time: 03/19/15  1:25 PM  Result Value Ref Range   B Natriuretic Peptide 141.6 (H) 0.0 - 100.0 pg/mL  Troponin I     Status: None   Collection Time: 03/19/15  6:02 PM  Result Value Ref Range   Troponin I <0.03 <0.031 ng/mL    Comment:        NO INDICATION OF MYOCARDIAL INJURY.   Glucose, capillary     Status: Abnormal   Collection Time: 03/19/15  6:30 PM  Result Value Ref Range   Glucose-Capillary 127 (H) 65 - 99 mg/dL  Glucose, capillary     Status: None   Collection Time: 03/19/15 11:34 PM  Result Value Ref Range   Glucose-Capillary 94 65 - 99 mg/dL  Comprehensive metabolic panel     Status: Abnormal   Collection Time: 03/20/15  4:55 AM  Result Value Ref Range   Sodium 136 135 - 145 mmol/L   Potassium 3.9 3.5 - 5.1 mmol/L   Chloride 106 101 - 111 mmol/L   CO2 25 22 - 32 mmol/L   Glucose, Bld 108 (H) 65 - 99  mg/dL   BUN 37 (H) 6 - 20 mg/dL   Creatinine, Ser 1.18 (H) 0.44 - 1.00 mg/dL   Calcium 8.7 (L) 8.9 - 10.3 mg/dL   Total Protein 6.7 6.5 - 8.1 g/dL   Albumin 2.4 (L) 3.5 - 5.0 g/dL   AST 36 15 - 41 U/L   ALT 48 14 - 54 U/L   Alkaline Phosphatase 92 38 - 126 U/L   Total Bilirubin 0.4 0.3 - 1.2 mg/dL   GFR calc non Af Amer 44 (L) >60 mL/min   GFR calc Af Amer 51 (L) >60 mL/min    Comment: (NOTE) The eGFR has been calculated using the CKD EPI equation. This calculation has not been validated in all clinical situations. eGFR's persistently <60 mL/min signify possible Chronic Kidney Disease.    Anion gap 5 5 - 15  CBC     Status: Abnormal   Collection Time: 03/20/15  4:55 AM  Result Value Ref Range   WBC 10.4 4.0 - 10.5 K/uL   RBC 3.93 3.87 - 5.11 MIL/uL   Hemoglobin 8.3 (L) 12.0 - 15.0 g/dL   HCT 27.5 (L) 36.0 - 46.0 %   MCV 70.0 (L) 78.0 - 100.0 fL   MCH 21.1 (L) 26.0 - 34.0 pg   MCHC 30.2 30.0 - 36.0 g/dL   RDW  15.0 11.5 - 15.5 %   Platelets 292 150 - 400 K/uL    Comment: REPEATED TO VERIFY SPECIMEN CHECKED FOR CLOTS   Glucose, capillary     Status: None   Collection Time: 03/20/15  6:08 AM  Result Value Ref Range   Glucose-Capillary 91 65 - 99 mg/dL  Glucose, capillary     Status: Abnormal   Collection Time: 03/20/15 11:41 AM  Result Value Ref Range   Glucose-Capillary 101 (H) 65 - 99 mg/dL  Glucose, capillary     Status: Abnormal   Collection Time: 03/20/15  3:46 PM  Result Value Ref Range   Glucose-Capillary 114 (H) 65 - 99 mg/dL  Glucose, capillary     Status: Abnormal   Collection Time: 03/21/15 12:15 AM  Result Value Ref Range   Glucose-Capillary 107 (H) 65 - 99 mg/dL  Glucose, capillary     Status: Abnormal   Collection Time: 03/21/15  7:47 AM  Result Value Ref Range   Glucose-Capillary 106 (H) 65 - 99 mg/dL    Imaging / Studies: Ct Abdomen Pelvis Wo Contrast  03/20/2015   CLINICAL DATA:  Enterocutaneous fistula.  Nausea.  EXAM: CT ABDOMEN AND PELVIS WITHOUT CONTRAST  TECHNIQUE: Multidetector CT imaging of the abdomen and pelvis was performed following the standard protocol without IV contrast.  COMPARISON:  03/13/2015  FINDINGS: Lower chest: No acute findings.  Hepatobiliary:  No mass visualized on this unenhanced exam.  Pancreas: No mass or inflammatory process visualized on this unenhanced exam.  Spleen:  Within normal limits in size.  Adrenal Glands:  No masses identified.  Kidneys/Urinary tract: No evidence of urolithiasis or hydronephrosis. Bilateral renal parenchymal scarring appears stable.  Stomach/Bowel/Peritoneum: Diffuse colonic diverticulosis is again demonstrated, however there is no evidence of diverticulitis.  Decreased size of extraluminal collection containing oral contrast and gas is seen within the anterior abdominal wall soft tissues near the level of the umbilicus. This currently measures 2.3 x 5.6 cm compared to 6.8 x 9.1 cm previously. Gas is also seen within a  complex fistula extending to the anterior abdominal wall skin surface near the midline. This is consistent with a complex  enterocutaneous fistula. No evidence of bowel obstruction.  Vascular/Lymphatic: No pathologically enlarged lymph nodes identified. No abdominal aortic aneurysm or other significant retroperitoneal abnormality demonstrated.  Reproductive: Prior hysterectomy noted. Adnexal regions are unremarkable in appearance.  Other:  None.  Musculoskeletal:  No suspicious bone lesions identified.  IMPRESSION: Decreased size of complex enterocutaneous fistula within the anterior abdominal wall soft tissues.  Colonic diverticulosis. No radiographic evidence of diverticulitis.   Electronically Signed   By: Earle Gell M.D.   On: 03/20/2015 17:18    Medications / Allergies:  Scheduled Meds: . amLODipine  10 mg Oral Daily  . colchicine  0.6 mg Oral BID  . diclofenac sodium  4 g Topical QID  . ertapenem  1 g Intravenous Q24H  . heparin subcutaneous  5,000 Units Subcutaneous 3 times per day  . insulin aspart  0-9 Units Subcutaneous Q8H  . metoprolol  5 mg Intravenous 4 times per day  . pantoprazole (PROTONIX) IV  40 mg Intravenous QHS  . sodium chloride  10-40 mL Intracatheter Q12H   Continuous Infusions: . Marland KitchenTPN (CLINIMIX-E) Adult 83 mL/hr at 03/20/15 1811   And  . fat emulsion 240 mL (03/20/15 1811)  . Marland KitchenTPN (CLINIMIX-E) Adult     And  . fat emulsion    . 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  -Abscess spontaneously drained and well controlled with eakin's pouch (to be changed M/W/F). -Encouraged ambulation -CT shows EC fistula, improving abscess PCM/TNA ID-Invanz D#8 for abdominal wall abscess VTE prophylaxis-heparin/SCD CKD-stable Gout-stop steroids 7/7.  C/w colchicine  CV-Hx CAD, HTN-.metoprolol, amlodipine, clamp NGT after giving. cards cleared for surgery.  OSA-CPAP H/o diagnosis of DVT- SCD's and heparin Diabetes mellitus-diet controlled. A1c 5.7%. SSI for TPN.  Anemia of chronic disease-stable Dispo-Continue inpatient.Plan for surgery next week with Dr. Hassell Done.    Rosario Adie, MD  Colorectal and General Surgery [facility]    03/21/2015 9:38 AM

## 2015-03-22 LAB — BASIC METABOLIC PANEL
Anion gap: 5 (ref 5–15)
BUN: 32 mg/dL — AB (ref 6–20)
CALCIUM: 8.8 mg/dL — AB (ref 8.9–10.3)
CO2: 26 mmol/L (ref 22–32)
CREATININE: 1.1 mg/dL — AB (ref 0.44–1.00)
Chloride: 106 mmol/L (ref 101–111)
GFR calc Af Amer: 56 mL/min — ABNORMAL LOW (ref >60)
GFR, EST NON AFRICAN AMERICAN: 48 mL/min — AB (ref >60)
GLUCOSE: 118 mg/dL — AB (ref 65–99)
Potassium: 4.1 mmol/L (ref 3.5–5.1)
SODIUM: 137 mmol/L (ref 135–145)

## 2015-03-22 LAB — GLUCOSE, CAPILLARY
Glucose-Capillary: 117 mg/dL — ABNORMAL HIGH (ref 65–99)
Glucose-Capillary: 130 mg/dL — ABNORMAL HIGH (ref 65–99)
Glucose-Capillary: 101 mg/dL — ABNORMAL HIGH (ref 65–99)

## 2015-03-22 MED ORDER — FAT EMULSION 20 % IV EMUL
240.0000 mL | INTRAVENOUS | Status: AC
  Administered 2015-03-22: 240 mL via INTRAVENOUS
  Filled 2015-03-22: qty 250

## 2015-03-22 MED ORDER — TRACE MINERALS CR-CU-MN-SE-ZN 10-1000-500-60 MCG/ML IV SOLN
INTRAVENOUS | Status: AC
  Administered 2015-03-22: 18:00:00 via INTRAVENOUS
  Filled 2015-03-22: qty 1992

## 2015-03-22 NOTE — Progress Notes (Signed)
Patient ID: Marissa [name], female   DOB: 28-Jul-1941, 74 y.o.   MRN: [id number]     [facility]      [address] [place], [place] [address]    Phone: [phone] FAX: [phone]     Subjective: No changes  Objective:  Vital signs:  Filed Vitals:   03/21/15 1210 03/21/15 1431 03/21/15 2039 03/22/15 0546  BP: 120/73 151/74 130/53 136/62  Pulse:  79 77 74  Temp:  97.6 F (36.4 C) 98.1 F (36.7 C) 98.2 F (36.8 C)  TempSrc:  Oral Oral Oral  Resp:  $Remo'20 16 16  'YdPyA$ Height:      Weight:      SpO2:  100% 100% 100%    Last BM Date: 03/20/15  Intake/Output   Yesterday:  07/09 0701 - 07/10 0700 In: 2770.5 [I.V.:240; IV Piggyback:300; TPN:2230.5] Out: 2226 [EHOZY:2482; Emesis/NG output:300; Stool:1]   Physical Exam: General: Pt in no acute distress MS: Normal AROM mjr joints. No obvious deformity Abdomen: Soft. Nondistended. Non tender. eakin's pouch in place with purulent milky output. No evidence of peritonitis.  Ext: SCDs BLE. No mjr edema. No cyanosis Skin: No petechiae / purpura  Problem List:   Principal Problem:   Enterocutaneous fistula Active Problems:   Gout   OSA (obstructive sleep apnea)   Essential hypertension   CAD S/P OM BMS 8/07, ISR-OM DES 8/09   GERD   Chronic kidney disease, stage 3   Hyperlipidemia with target LDL less than 100   Chronic diastolic CHF (congestive heart failure)   Diverticulosis of colon with hemorrhage   Anemia, iron deficiency   Abdominal wall abscess    Results:   Labs: Results for orders placed or performed during the hospital encounter of 03/13/15 (from the past 48 hour(s))  Glucose, capillary     Status: Abnormal   Collection Time: 03/20/15 11:41 AM  Result Value Ref Range   Glucose-Capillary 101 (H) 65 - 99 mg/dL  Glucose, capillary     Status: Abnormal   Collection Time: 03/20/15  3:46 PM  Result Value Ref Range   Glucose-Capillary 114 (H) 65 - 99 mg/dL   Glucose, capillary     Status: Abnormal   Collection Time: 03/21/15 12:15 AM  Result Value Ref Range   Glucose-Capillary 107 (H) 65 - 99 mg/dL  Glucose, capillary     Status: Abnormal   Collection Time: 03/21/15  7:47 AM  Result Value Ref Range   Glucose-Capillary 106 (H) 65 - 99 mg/dL  Glucose, capillary     Status: Abnormal   Collection Time: 03/21/15  4:29 PM  Result Value Ref Range   Glucose-Capillary 106 (H) 65 - 99 mg/dL  Glucose, capillary     Status: Abnormal   Collection Time: 03/21/15 11:24 PM  Result Value Ref Range   Glucose-Capillary 106 (H) 65 - 99 mg/dL  Basic metabolic panel     Status: Abnormal   Collection Time: 03/22/15  4:30 AM  Result Value Ref Range   Sodium 137 135 - 145 mmol/L   Potassium 4.1 3.5 - 5.1 mmol/L   Chloride 106 101 - 111 mmol/L   CO2 26 22 - 32 mmol/L   Glucose, Bld 118 (H) 65 - 99 mg/dL   BUN 32 (H) 6 - 20 mg/dL   Creatinine, Ser 1.10 (H) 0.44 - 1.00 mg/dL   Calcium 8.8 (L) 8.9 - 10.3 mg/dL   GFR calc non Af Amer 48 (L) >60 mL/min  GFR calc Af Amer 56 (L) >60 mL/min    Comment: (NOTE) The eGFR has been calculated using the CKD EPI equation. This calculation has not been validated in all clinical situations. eGFR's persistently <60 mL/min signify possible Chronic Kidney Disease.    Anion gap 5 5 - 15  Glucose, capillary     Status: Abnormal   Collection Time: 03/22/15  8:14 AM  Result Value Ref Range   Glucose-Capillary 117 (H) 65 - 99 mg/dL    Imaging / Studies: Ct Abdomen Pelvis Wo Contrast  03/20/2015   CLINICAL DATA:  Enterocutaneous fistula.  Nausea.  EXAM: CT ABDOMEN AND PELVIS WITHOUT CONTRAST  TECHNIQUE: Multidetector CT imaging of the abdomen and pelvis was performed following the standard protocol without IV contrast.  COMPARISON:  03/13/2015  FINDINGS: Lower chest: No acute findings.  Hepatobiliary:  No mass visualized on this unenhanced exam.  Pancreas: No mass or inflammatory process visualized on this unenhanced exam.   Spleen:  Within normal limits in size.  Adrenal Glands:  No masses identified.  Kidneys/Urinary tract: No evidence of urolithiasis or hydronephrosis. Bilateral renal parenchymal scarring appears stable.  Stomach/Bowel/Peritoneum: Diffuse colonic diverticulosis is again demonstrated, however there is no evidence of diverticulitis.  Decreased size of extraluminal collection containing oral contrast and gas is seen within the anterior abdominal wall soft tissues near the level of the umbilicus. This currently measures 2.3 x 5.6 cm compared to 6.8 x 9.1 cm previously. Gas is also seen within a complex fistula extending to the anterior abdominal wall skin surface near the midline. This is consistent with a complex enterocutaneous fistula. No evidence of bowel obstruction.  Vascular/Lymphatic: No pathologically enlarged lymph nodes identified. No abdominal aortic aneurysm or other significant retroperitoneal abnormality demonstrated.  Reproductive: Prior hysterectomy noted. Adnexal regions are unremarkable in appearance.  Other:  None.  Musculoskeletal:  No suspicious bone lesions identified.  IMPRESSION: Decreased size of complex enterocutaneous fistula within the anterior abdominal wall soft tissues.  Colonic diverticulosis. No radiographic evidence of diverticulitis.   Electronically Signed   By: Earle Gell M.D.   On: 03/20/2015 17:18    Medications / Allergies:  Scheduled Meds: . amLODipine  10 mg Oral Daily  . colchicine  0.6 mg Oral BID  . diclofenac sodium  4 g Topical QID  . ertapenem  1 g Intravenous Q24H  . heparin subcutaneous  5,000 Units Subcutaneous 3 times per day  . insulin aspart  0-9 Units Subcutaneous Q8H  . metoprolol  5 mg Intravenous 4 times per day  . pantoprazole (PROTONIX) IV  40 mg Intravenous QHS  . sodium chloride  10-40 mL Intracatheter Q12H   Continuous Infusions: . Marland KitchenTPN (CLINIMIX-E) Adult 83 mL/hr at 03/21/15 1721   And  . fat emulsion 240 mL (03/21/15 1721)  . Marland KitchenTPN  (CLINIMIX-E) Adult     And  . fat emulsion    . sodium chloride 10 mL/hr at 03/19/15 1000   PRN Meds:.acetaminophen **OR** acetaminophen, diphenhydrAMINE **OR** diphenhydrAMINE, hydrALAZINE, morphine injection, nitroGLYCERIN, ondansetron, sodium chloride  Antibiotics: Anti-infectives    Start     Dose/Rate Route Frequency Ordered Stop   03/13/15 2000  ertapenem (INVANZ) 1 g in sodium chloride 0.9 % 50 mL IVPB     1 g 100 mL/hr over 30 Minutes Intravenous Every 24 hours 03/13/15 1829         Assessment/Plan Enterocutaneous fistula with large abdominal wall abscess -H/o colon resection, hysterectomy and urgent laparotomy for abscess presumed EC fistula -NGT, bowel  rest, TNA.  -Abscess spontaneously drained and well controlled with eakin's pouch (to be changed M/W/F). -Encouraged ambulation -CT shows EC fistula, improving abscess size PCM/TNA ID-Invanz D#9 for abdominal wall abscess VTE prophylaxis-heparin/SCD CKD-stable Gout-stop steroids 7/7.  C/w colchicine  CV-Hx CAD, HTN-.metoprolol, amlodipine, clamp NGT after giving. cards cleared for surgery.  OSA-CPAP H/o diagnosis of DVT- SCD's and heparin Diabetes mellitus-diet controlled. A1c 5.7%. SSI for TPN.  Anemia of chronic disease-stable Dispo-Continue inpatient.Plan for surgery next week with Dr. Hassell Done.    Rosario Adie, MD  Colorectal and General Surgery [facility]    03/22/2015 9:38 AM

## 2015-03-22 NOTE — Progress Notes (Signed)
PARENTERAL NUTRITION CONSULT NOTE - FOLLOW UP  Pharmacy Consult for TPN Indication: Enterocutaneous fistula  No Known Allergies  Patient Measurements: Height: 5\' 4"  (162.6 cm) Weight: 222 lb 10.6 oz (101 kg) IBW/kg (Calculated) : 54.7 Adjusted Body Weight: 70 kg Usual Weight: 101 kg  Vital Signs: Temp: 98.2 F (36.8 C) (07/10 0546) Temp Source: Oral (07/10 0546) BP: 136/62 mmHg (07/10 0546) Pulse Rate: 74 (07/10 0546) Intake/Output from previous day: 07/09 0701 - 07/10 0700 In: 2770.5 [I.V.:240; IV Piggyback:300; TPN:2230.5] Out: 2226 [IONGE:9528; Emesis/NG output:300; Stool:1] Intake/Output from this shift: Total I/O In: -  Out: 300 [Urine:300]  Labs:  Recent Labs  03/20/15 0455  WBC 10.4  HGB 8.3*  HCT 27.5*  PLT 292     Recent Labs  03/20/15 0455 03/22/15 0430  NA 136 137  K 3.9 4.1  CL 106 106  CO2 25 26  GLUCOSE 108* 118*  BUN 37* 32*  CREATININE 1.18* 1.10*  CALCIUM 8.7* 8.8*  PROT 6.7  --   ALBUMIN 2.4*  --   AST 36  --   ALT 48  --   ALKPHOS 92  --   BILITOT 0.4  --    Estimated Creatinine Clearance: 51.9 mL/min (by C-G formula based on Cr of 1.1).    Recent Labs  03/21/15 0747 03/21/15 1629 03/21/15 2324  GLUCAP 106* 106* 106*    Insulin Requirements: sensitive scale Novolog q6h, 0 unit administered in past 24 hours  Current Nutrition: NPO  IVF: NS @ [place] access: 7/3 TPN start date: 7/3  ASSESSMENT  HPI: [name] admitted 7/1 with abdominal pain. Per CT: mesh erosion (previous hernia repair) into small bowel with abscess and EC fistula. Bowel rest planned, TPN initiated with pharmacy dosing assistance requested.  Per surgery, likely that she will need to go to the operating room next week for laparotomy, small bowel resection, and repair of abdominal wall defect.  Significant events:  7/2: unable to place PICC; TPN  postponed 7/3: TPN started 7/4: Feraheme 510 mg IV x 1 administered for iron-deficiency anemia. 7/5: Patient accidentally removed NGT during sleep early this AM; reinserted by RN.  Today, 03/22/2015:   Glucose - controlled, at goal <150 mg/dL  Electrolytes -  All lytes, including Corrected Ca, WNL  Renal - SCr improved since admission (baseline?), CrCl~48 ml/min.  LFTs - WNL   TGs - WNL, 82 (7/4)  Prealbumin - baseline 5.5 (7/3)  NUTRITIONAL GOALS  RD recommendations from 7/5: 1650 - 1850 KCal/day 85-100 grams protein//day  Clinimix E 5/15 at a goal rate of 83 ml/hr + 20% fat emulsion at 10 ml/hr will provide: 100 g/day protein, 1894 Kcal/day.  PLAN  1. At 1800 today:  Continue Clinimix E 5/15 at 83 ml/hr (goal rate).  Continue 20% fat emulsion at 10 ml/hr.  TPN to contain standard multivitamins and trace elements.  Continue NS at [facility].  2. CBG checks and SSI q8h.  3. TPN lab panels on Mondays & Thursdays.  4. Follow clinical course daily.   Adrian Saran, PharmD, BCPS Pager [phone] 03/22/2015 8:11 AM

## 2015-03-23 ENCOUNTER — Inpatient Hospital Stay (HOSPITAL_COMMUNITY): Payer: Medicare Other

## 2015-03-23 LAB — CBC
HEMATOCRIT: 34.8 % — AB (ref 36.0–46.0)
Hemoglobin: 10.2 g/dL — ABNORMAL LOW (ref 12.0–15.0)
MCH: 20.9 pg — AB (ref 26.0–34.0)
MCHC: 29.3 g/dL — AB (ref 30.0–36.0)
MCV: 71.5 fL — ABNORMAL LOW (ref 78.0–100.0)
PLATELETS: 272 10*3/uL (ref 150–400)
RBC: 4.87 MIL/uL (ref 3.87–5.11)
RDW: 15.6 % — AB (ref 11.5–15.5)
WBC: 7.1 10*3/uL (ref 4.0–10.5)

## 2015-03-23 LAB — COMPREHENSIVE METABOLIC PANEL
ALT: 46 U/L (ref 14–54)
AST: 35 U/L (ref 15–41)
Albumin: 2.8 g/dL — ABNORMAL LOW (ref 3.5–5.0)
Alkaline Phosphatase: 109 U/L (ref 38–126)
Anion gap: 5 (ref 5–15)
BUN: 35 mg/dL — ABNORMAL HIGH (ref 6–20)
CO2: 25 mmol/L (ref 22–32)
Calcium: 9 mg/dL (ref 8.9–10.3)
Chloride: 103 mmol/L (ref 101–111)
Creatinine, Ser: 1.1 mg/dL — ABNORMAL HIGH (ref 0.44–1.00)
GFR calc non Af Amer: 48 mL/min — ABNORMAL LOW (ref >60)
GFR, EST AFRICAN AMERICAN: 56 mL/min — AB (ref >60)
Glucose, Bld: 121 mg/dL — ABNORMAL HIGH (ref 65–99)
Potassium: 4 mmol/L (ref 3.5–5.1)
SODIUM: 133 mmol/L — AB (ref 135–145)
Total Bilirubin: 0.3 mg/dL (ref 0.3–1.2)
Total Protein: 7.2 g/dL (ref 6.5–8.1)

## 2015-03-23 LAB — GLUCOSE, CAPILLARY
GLUCOSE-CAPILLARY: 118 mg/dL — AB (ref 65–99)
Glucose-Capillary: 101 mg/dL — ABNORMAL HIGH (ref 65–99)
Glucose-Capillary: 102 mg/dL — ABNORMAL HIGH (ref 65–99)
Glucose-Capillary: 104 mg/dL — ABNORMAL HIGH (ref 65–99)
Glucose-Capillary: 110 mg/dL — ABNORMAL HIGH (ref 65–99)

## 2015-03-23 LAB — PREALBUMIN: Prealbumin: 31.7 mg/dL (ref 18–38)

## 2015-03-23 LAB — DIFFERENTIAL
BASOS ABS: 0 10*3/uL (ref 0.0–0.1)
BASOS PCT: 0 % (ref 0–1)
EOS PCT: 4 % (ref 0–5)
Eosinophils Absolute: 0.3 10*3/uL (ref 0.0–0.7)
Lymphocytes Relative: 15 % (ref 12–46)
Lymphs Abs: 1.1 10*3/uL (ref 0.7–4.0)
MONO ABS: 0.7 10*3/uL (ref 0.1–1.0)
MONOS PCT: 10 % (ref 3–12)
NEUTROS PCT: 71 % (ref 43–77)
Neutro Abs: 5 10*3/uL (ref 1.7–7.7)

## 2015-03-23 LAB — TRIGLYCERIDES: Triglycerides: 135 mg/dL (ref <150)

## 2015-03-23 LAB — PHOSPHORUS: Phosphorus: 3.6 mg/dL (ref 2.5–4.6)

## 2015-03-23 LAB — MAGNESIUM: MAGNESIUM: 2 mg/dL (ref 1.7–2.4)

## 2015-03-23 MED ORDER — FAT EMULSION 20 % IV EMUL
240.0000 mL | INTRAVENOUS | Status: AC
  Administered 2015-03-23: 240 mL via INTRAVENOUS
  Filled 2015-03-23: qty 250

## 2015-03-23 MED ORDER — TRACE MINERALS CR-CU-MN-SE-ZN 10-1000-500-60 MCG/ML IV SOLN
INTRAVENOUS | Status: AC
  Administered 2015-03-23: 18:00:00 via INTRAVENOUS
  Filled 2015-03-23: qty 1992

## 2015-03-23 MED ORDER — IOHEXOL 300 MG/ML  SOLN
10.0000 mL | Freq: Once | INTRAMUSCULAR | Status: AC | PRN
  Administered 2015-03-23: 7 mL via INTRATHECAL

## 2015-03-23 NOTE — Progress Notes (Addendum)
PARENTERAL NUTRITION CONSULT NOTE - FOLLOW UP  Pharmacy Consult for TPN Indication: Enterocutaneous fistula  No Known Allergies  Patient Measurements: Height: 5\' 4"  (162.6 cm) Weight: 222 lb 10.6 oz (101 kg) IBW/kg (Calculated) : 54.7 Adjusted Body Weight: 70 kg Usual Weight: 101 kg  Vital Signs: Temp: 99.5 F (37.5 C) (07/11 0600) Temp Source: Oral (07/11 0600) BP: 132/87 mmHg (07/11 0600) Pulse Rate: 85 (07/11 0600) Intake/Output from previous day: 07/10 0701 - 07/11 0700 In: 1191.4 [I.V.:115.7; TPN:1075.7] Out: 2245 [Urine:1975; Emesis/NG output:250; Drains:20] Intake/Output from this shift:    Labs:  Recent Labs  03/23/15 0425  WBC 7.1  HGB 10.2*  HCT 34.8*  PLT 272     Recent Labs  03/22/15 0430 03/23/15 0425  NA 137 133*  K 4.1 4.0  CL 106 103  CO2 26 25  GLUCOSE 118* 121*  BUN 32* 35*  CREATININE 1.10* 1.10*  CALCIUM 8.8* 9.0  MG  --  2.0  PHOS  --  3.6  PROT  --  7.2  ALBUMIN  --  2.8*  AST  --  35  ALT  --  46  ALKPHOS  --  109  BILITOT  --  0.3   Estimated Creatinine Clearance: 51.9 mL/min (by C-G formula based on Cr of 1.1).    Recent Labs  03/22/15 1213 03/22/15 1621 03/23/15 0016  GLUCAP 130* 101* 104*    Insulin Requirements: sensitive scale Novolog q8h, 0 units administered in past 24 hours  Current Nutrition: NPO  IVF: NS @ [place] access: 7/3 TPN start date: 7/3  ASSESSMENT  HPI: [name] admitted 7/1 with abdominal pain. Per CT: mesh erosion (previous hernia repair) into small bowel with abscess and EC fistula. Bowel rest planned, TPN initiated with pharmacy dosing assistance requested.  Per surgery, likely that she will need to go to the operating room for laparotomy, small bowel resection, and repair of abdominal wall defect.  Significant events:  7/2: unable to place PICC; TPN postponed 7/3: TPN  started 7/4: Feraheme 510 mg IV x 1 administered for iron-deficiency anemia. 7/5: Patient accidentally removed NGT during sleep early this AM; reinserted by RN.  Today, 03/23/2015:   Glucose - controlled, at goal <150 mg/dL  Electrolytes -  Na slightly low; all other lytes, including Corrected Ca, WNL  Renal - SCr improved since admission, CrCl~52 ml/min.  LFTs - WNL   TGs - WNL, 82 (7/4), 135 (7/11)  Prealbumin - baseline 5.5 (7/3), 31.7 (7/11)  NUTRITIONAL GOALS  RD recommendations from 7/8: 1650 - 1850 KCal/day 85-100 grams protein//day  Clinimix E 5/15 at a goal rate of 83 ml/hr + 20% fat emulsion at 10 ml/hr will provide: 100 g/day protein, 1894 Kcal/day.  PLAN  1. At 1800 today:  Continue Clinimix E 5/15 at 83 ml/hr (goal rate).  Continue 20% fat emulsion at 10 ml/hr.  TPN to contain standard multivitamins and trace elements.  Continue NS at [facility].  2. Continue CBG checks and SSI q8h.  3. TPN lab panels on Mondays & Thursdays.  4. Follow clinical course daily. 5. F/U surgery plans for this week.   Lindell Spar, PharmD, BCPS Pager: [phone] 03/23/2015 7:58 AM

## 2015-03-23 NOTE — Progress Notes (Signed)
Physical Therapy Treatment Patient Details Name: Marissa [name] MRN: [id number] DOB: Jun 03, 1941 Today's Date: 03/23/2015    History of Present Illness 74 year old female with history of CAD s/p multiple stents, chronic diastolic heart failure, diabetes mellitus with peripheral neuropathy, DVT no longer on anticoagulation secondary to recurrent diverticular and gastric bleeding, gout, obstructive sleep apnea, and multiple abdominal surgeries  Pt admitted for Enterocutaneous fistula and also with gout flare.    PT Comments    Pt very motivated and pleasant.  Assisted with amb full unit with a RW just for safety/balance.  Believe pt will not need one when she is D/C to home.    Follow Up Recommendations  Home health PT;Supervision/Assistance - 24 hour     Equipment Recommendations  None recommended by PT    Recommendations for Other Services       Precautions / Restrictions Precautions Precautions: Fall Precaution Comments: EC fistula pouch, NG tube. wound VAC Restrictions Weight Bearing Restrictions: No    Mobility  Bed Mobility               General bed mobility comments: Pt OOB in recliner  Transfers Overall transfer level: Needs assistance Equipment used: None Transfers: Sit to/from Stand Sit to Stand: Min guard         General transfer comment: min guard for safety with lines/tubes.  did not need use of walker for transfers and a few steps from recliner to [facility].  good safety cognition.  Ambulation/Gait Ambulation/Gait assistance: Supervision;Min guard Ambulation Distance (Feet): 420 Feet Assistive device: Rolling walker (2 wheeled) Gait Pattern/deviations: Step-through pattern;Decreased stride length;Trunk flexed Gait velocity: decreased   General Gait Details: verbal cues for RW distance, posture, slow speed however pt able to improve distance today    Stairs            Wheelchair Mobility    Modified Rankin (Stroke Patients Only)        Balance                                    Cognition Arousal/Alertness: Awake/alert Behavior During Therapy: WFL for tasks assessed/performed Overall Cognitive Status: Within Functional Limits for tasks assessed                      Exercises      General Comments        Pertinent Vitals/Pain Pain Assessment: Faces Faces Pain Scale: Hurts a little bit Pain Location: ABD with activity Pain Descriptors / Indicators: Sore Pain Intervention(s): Monitored during session;Repositioned    Home Living                      Prior Function            PT Goals (current goals can now be found in the care plan section) Progress towards PT goals: Progressing toward goals    Frequency  Min 3X/week    PT Plan Current plan remains appropriate    Co-evaluation             End of Session   Activity Tolerance: Patient tolerated treatment well Patient left: in chair;with call bell/phone within reach     Time: 2595-6387 PT Time Calculation (min) (ACUTE ONLY): 15 min  Charges:  $Gait Training: 8-22 mins                    G  Codes:      Rica Koyanagi  PTA [facility]  Acute  Rehab Pager      [phone]

## 2015-03-24 LAB — GLUCOSE, CAPILLARY
Glucose-Capillary: 117 mg/dL — ABNORMAL HIGH (ref 65–99)
Glucose-Capillary: 118 mg/dL — ABNORMAL HIGH (ref 65–99)

## 2015-03-24 MED ORDER — TRACE MINERALS CR-CU-MN-SE-ZN 10-1000-500-60 MCG/ML IV SOLN
INTRAVENOUS | Status: AC
  Administered 2015-03-24: 17:00:00 via INTRAVENOUS
  Filled 2015-03-24: qty 1992

## 2015-03-24 MED ORDER — FAT EMULSION 20 % IV EMUL
240.0000 mL | INTRAVENOUS | Status: AC
  Administered 2015-03-24: 240 mL via INTRAVENOUS
  Filled 2015-03-24: qty 250

## 2015-03-24 NOTE — Progress Notes (Signed)
PARENTERAL NUTRITION CONSULT NOTE - FOLLOW UP  Pharmacy Consult for TPN Indication: Enterocutaneous fistula  No Known Allergies  Patient Measurements: Height: 5\' 4"  (162.6 cm) Weight: 222 lb 10.6 oz (101 kg) IBW/kg (Calculated) : 54.7 Adjusted Body Weight: 70 kg Usual Weight: 101 kg  Vital Signs: Temp: 99.8 F (37.7 C) (07/12 0539) Temp Source: Oral (07/12 0539) BP: 143/52 mmHg (07/12 0539) Pulse Rate: 79 (07/12 0539) Intake/Output from previous day: 07/11 0701 - 07/12 0700 In: 414.5 [I.V.:364.5; IV Piggyback:50] Out: 2058 [Urine:1850; Emesis/NG output:205; Stool:3] Intake/Output from this shift:    Labs:  Recent Labs  03/23/15 0425  WBC 7.1  HGB 10.2*  HCT 34.8*  PLT 272     Recent Labs  03/22/15 0430 03/23/15 0425  NA 137 133*  K 4.1 4.0  CL 106 103  CO2 26 25  GLUCOSE 118* 121*  BUN 32* 35*  CREATININE 1.10* 1.10*  CALCIUM 8.8* 9.0  MG  --  2.0  PHOS  --  3.6  PROT  --  7.2  ALBUMIN  --  2.8*  AST  --  35  ALT  --  46  ALKPHOS  --  109  BILITOT  --  0.3  PREALBUMIN  --  31.7  TRIG  --  135   Estimated Creatinine Clearance: 51.9 mL/min (by C-G formula based on Cr of 1.1).    Recent Labs  03/23/15 0740 03/23/15 1618 03/23/15 2354  GLUCAP 118* 101* 102*    Insulin Requirements: sensitive scale Novolog q8h, 0 units administered in past 24 hours  Current Nutrition: NPO  IVF: NS @ [place] access: 7/3 TPN start date: 7/3  ASSESSMENT  HPI: [name] admitted 7/1 with abdominal pain. Per CT: mesh erosion (previous hernia repair) into small bowel with abscess and EC fistula. Bowel rest planned, TPN initiated with pharmacy dosing assistance requested.  Per surgery, likely that she will need to go to the operating room for laparotomy, small bowel resection, and repair of abdominal wall defect.  Significant events:  7/2: unable to place  PICC; TPN postponed 7/3: TPN started 7/4: Feraheme 510 mg IV x 1 administered for iron-deficiency anemia. 7/5: Patient accidentally removed NGT during sleep early this AM; reinserted by RN.  Today, 03/24/2015:   Glucose - controlled, at goal <150 mg/dL  Electrolytes -  No new labs today. On 7/11, Na slightly low; all other lytes, including Corrected Ca, WNL  Renal - SCr improved since admission, CrCl~52 ml/min (7/11)  LFTs - WNL (7/11)  TGs - WNL, 82 (7/4), 135 (7/11)  Prealbumin - baseline 5.5 (7/3), 31.7 (7/11)  NUTRITIONAL GOALS  RD recommendations from 7/8: 1650 - 1850 KCal/day 85-100 grams protein//day  Clinimix E 5/15 at a goal rate of 83 ml/hr + 20% fat emulsion at 10 ml/hr will provide: 100 g/day protein, 1894 Kcal/day.  PLAN  1. At 1800 today:  Continue Clinimix E 5/15 at 83 ml/hr (goal rate).  Continue 20% fat emulsion at 10 ml/hr.  TPN to contain standard multivitamins and trace elements.  Continue NS at [facility].  2. Continue CBG checks and SSI q8h.  3. TPN lab panels on Mondays & Thursdays.  4. BMET in AM. 5. Follow clinical course daily. 6. F/U surgery plans.    Lindell Spar, PharmD, BCPS Pager: [phone] 03/24/2015 7:58 AM

## 2015-03-24 NOTE — Progress Notes (Signed)
CSW received consult for Medicaid enrollment. CSW provided patient with application and information on how to apply through Dept of Social Services.   No further CSW needs identified - CSW signing off.   Raynaldo Opitz, [facility] Clinical Social Worker cell #: [phone]

## 2015-03-24 NOTE — Progress Notes (Signed)
Subjective: She seems stable,comfortable, and very sedentary.  Fistula draining tube feeding colored fluid.  No real complaints.  She wants to know how long before she can eat.  Objective: Vital signs in last 24 hours: Temp:  [98.1 F (36.7 C)-99.8 F (37.7 C)] 98.1 F (36.7 C) (07/12 0930) Pulse Rate:  [75-85] 76 (07/12 0930) Resp:  [14-18] 14 (07/12 0930) BP: (107-143)/(52-88) 133/74 mmHg (07/12 1003) SpO2:  [100 %] 100 % (07/12 0930) Last BM Date: 03/22/15 NPO TNA Stools x 3 NG - 205 Afebrile, VSS No labs today creatinine 1.10 yesterday Film yesterday:  Sinus/fistula tube check:  Contrast injection into cutaneous fistula opacifies abdominal wall abscess. No opacification of bowel loops identified.  Intake/Output from previous day: 07/11 0701 - 07/12 0700 In: 414.5 [I.V.:364.5; IV Piggyback:50] Out: 2058 [Urine:1850; Emesis/NG output:205; Stool:3] Intake/Output this shift: Total I/O In: 15 [NG/GT:15] Out: 250 [Urine:200; Emesis/NG output:50]  General appearance: alert, cooperative, no distress and very low key affect. Resp: clear to auscultation bilaterally GI: soft, fistula draining a cloudy off white colored fluid, looks like tube feeding.  + BS. she is up in chair, comfortable, nontender.  Panniculitis is better.   Lab Results:   Recent Labs  03/23/15 0425  WBC 7.1  HGB 10.2*  HCT 34.8*  PLT 272    BMET  Recent Labs  03/22/15 0430 03/23/15 0425  NA 137 133*  K 4.1 4.0  CL 106 103  CO2 26 25  GLUCOSE 118* 121*  BUN 32* 35*  CREATININE 1.10* 1.10*  CALCIUM 8.8* 9.0   PT/INR No results for input(s): LABPROT, INR in the last 72 hours.   Recent Labs Lab 03/19/15 0515 03/20/15 0455 03/23/15 0425  AST 63* 36 35  ALT 66* 48 46  ALKPHOS 105 92 109  BILITOT 0.3 0.4 0.3  PROT 7.2 6.7 7.2  ALBUMIN 2.7* 2.4* 2.8*     Lipase     Component Value Date/Time   LIPASE 27 12/26/2014 1815     Studies/Results: Dg Sinus/fist Tube Chk-non  Gi  03/23/2015   CLINICAL DATA:  History of enterocutaneous fistula.  EXAM: ABSCESS INJECTION  CONTRAST:  72mL OMNIPAQUE IOHEXOL 300 MG/ML  SOLN  FLUOROSCOPY TIME:  Radiation Exposure Index (as provided by the fluoroscopic device):  If the device does not provide the exposure index:  Fluoroscopy Time (in minutes and seconds):  43 seconds  Number of Acquired Images:  7  COMPARISON:  03/20/2015  FINDINGS: A narrow gauge catheter was inserted into the cutaneous fistula. Retention balloon was filled with 5 cc of air. Using gentle pressure water-soluble contrast material was injected through the catheter filling the anterior abdominal wall abscess. With continued injection contrast material spilled from the abscess cavity onto the skin surface. No opacification of bowel loops achieved.  IMPRESSION: 1. Contrast injection into cutaneous fistula opacifies abdominal wall abscess. No opacification of bowel loops identified.   Electronically Signed   By: Kerby Moors M.D.   On: 03/23/2015 14:36    Medications: . amLODipine  10 mg Oral Daily  . colchicine  0.6 mg Oral BID  . diclofenac sodium  4 g Topical QID  . ertapenem  1 g Intravenous Q24H  . heparin subcutaneous  5,000 Units Subcutaneous 3 times per day  . insulin aspart  0-9 Units Subcutaneous Q8H  . metoprolol  5 mg Intravenous 4 times per day  . pantoprazole (PROTONIX) IV  40 mg Intravenous QHS  . sodium chloride  10-40 mL Intracatheter Q12H   . Marland Kitchen  TPN (CLINIMIX-E) Adult 83 mL/hr at 03/23/15 1749   And  . fat emulsion 240 mL (03/23/15 1749)  . Marland KitchenTPN (CLINIMIX-E) Adult     And  . fat emulsion    . sodium chloride 10 mL/hr at 03/19/15 1000   Prior to Admission medications   Medication Sig Start Date End Date Taking? Authorizing Provider  colchicine 0.6 MG tablet Take 1 tablet (0.6 mg total) by mouth 2 (two) times daily. 12/25/14  Yes Reyne Dumas, MD  cyanocobalamin 2000 MCG tablet Take 1 tablet (2,000 mcg total) by mouth daily. 01/07/15  Yes Janith Lima, MD  furosemide (LASIX) 20 MG tablet Take 20 mg by mouth daily as needed for fluid.  11/03/14  Yes Historical Provider, MD  metoprolol tartrate (LOPRESSOR) 25 MG tablet Take 1 tablet (25 mg total) by mouth every morning. 12/25/14  Yes Reyne Dumas, MD  nitroGLYCERIN (NITROSTAT) 0.4 MG SL tablet Place 1 tablet (0.4 mg total) under the tongue every 5 (five) minutes as needed for chest pain. 02/26/13  Yes Larey Dresser, MD  pantoprazole (PROTONIX) 40 MG tablet Take 1 tablet (40 mg total) by mouth daily. 12/25/14  Yes Reyne Dumas, MD  VOLTAREN 1 % GEL Apply 2 g topically 4 (four) times daily as needed (pain).  12/25/14  Yes Historical Provider, MD  amLODipine (NORVASC) 10 MG tablet Take 1 tablet (10 mg total) by mouth daily. 05/29/14   Janith Lima, MD  atorvastatin (LIPITOR) 40 MG tablet Take 1 tablet (40 mg total) by mouth daily. Patient not taking: Reported on 03/13/2015 12/25/14   Reyne Dumas, MD  Ferrous Sulfate (IRON) 325 (65 FE) MG TABS Take 325 mg by mouth 2 (two) times daily. 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 C/w colchicine  CV-Hx CAD, HTN-.metoprolol, amlodipine, clamp NGT after giving. cards cleared for surgery.  OSA-CPAP H/o diagnosis of DVT- SCD's and heparin Diabetes mellitus-diet controlled. A1c 5.7%. SSI for TPN.  Anemia of chronic disease-stable Body mass index is 38.2 kg/(m^2). PCM/TNA ID-Invanz D#8 for abdominal wall abscess VTE prophylaxis-heparin/SCD  Plan:  Tentative plan last week was to operate on her this week after some nutrition with TNA.  Now with no sign of a fistula Dr. Hassell Done considering holding off and see if this seals.  Hopefully to preclude a need for surgery, and just manage with a drain.   Labs Monday were quite good. Will discuss further plans with Dr. Hassell Done.      LOS: 11 days    Zakarie Sturdivant 03/24/2015

## 2015-03-25 LAB — BASIC METABOLIC PANEL
Anion gap: 7 (ref 5–15)
BUN: 36 mg/dL — ABNORMAL HIGH (ref 6–20)
CO2: 25 mmol/L (ref 22–32)
Calcium: 9.4 mg/dL (ref 8.9–10.3)
Chloride: 104 mmol/L (ref 101–111)
Creatinine, Ser: 1.14 mg/dL — ABNORMAL HIGH (ref 0.44–1.00)
GFR calc non Af Amer: 46 mL/min — ABNORMAL LOW (ref >60)
GFR, EST AFRICAN AMERICAN: 54 mL/min — AB (ref >60)
Glucose, Bld: 117 mg/dL — ABNORMAL HIGH (ref 65–99)
Potassium: 4.3 mmol/L (ref 3.5–5.1)
Sodium: 136 mmol/L (ref 135–145)

## 2015-03-25 LAB — GLUCOSE, CAPILLARY
GLUCOSE-CAPILLARY: 107 mg/dL — AB (ref 65–99)
GLUCOSE-CAPILLARY: 128 mg/dL — AB (ref 65–99)
Glucose-Capillary: 104 mg/dL — ABNORMAL HIGH (ref 65–99)
Glucose-Capillary: 115 mg/dL — ABNORMAL HIGH (ref 65–99)

## 2015-03-25 MED ORDER — INSULIN ASPART 100 UNIT/ML ~~LOC~~ SOLN: 0.0000 [IU] | SUBCUTANEOUS | Status: AC

## 2015-03-25 MED ORDER — FUROSEMIDE 20 MG PO TABS
20.0000 mg | ORAL_TABLET | Freq: Every day | ORAL | Status: DC | PRN
  Filled 2015-03-25: qty 1

## 2015-03-25 MED ORDER — TRACE MINERALS CR-CU-MN-SE-ZN 10-1000-500-60 MCG/ML IV SOLN
INTRAVENOUS | Status: AC
  Administered 2015-03-25: 18:00:00 via INTRAVENOUS
  Filled 2015-03-25: qty 1700

## 2015-03-25 MED ORDER — FAT EMULSION 20 % IV EMUL
234.0000 mL | INTRAVENOUS | Status: AC
Start: 2015-03-25 — End: 2015-03-26
  Administered 2015-03-25: 234 mL via INTRAVENOUS
  Filled 2015-03-25: qty 250

## 2015-03-25 MED ORDER — METOPROLOL TARTRATE 25 MG PO TABS
25.0000 mg | ORAL_TABLET | Freq: Every morning | ORAL | Status: DC
  Administered 2015-03-25 – 2015-03-27 (×3): 25 mg via ORAL
  Filled 2015-03-25 (×3): qty 1

## 2015-03-25 MED ORDER — ATORVASTATIN CALCIUM 40 MG PO TABS: 40.0000 mg | ORAL_TABLET | Freq: Every day | ORAL | Status: DC

## 2015-03-25 NOTE — Progress Notes (Signed)
PARENTERAL NUTRITION CONSULT NOTE - FOLLOW UP  Pharmacy Consult for TPN Indication: Enterocutaneous fistula  No Known Allergies  Patient Measurements: Height: 5\' 4"  (162.6 cm) Weight: 222 lb 10.6 oz (101 kg) IBW/kg (Calculated) : 54.7 Adjusted Body Weight: 70 kg Usual Weight: 101 kg  Vital Signs: Temp: 97.9 F (36.6 C) (07/13 0520) Temp Source: Oral (07/13 0520) BP: 136/62 mmHg (07/13 0520) Pulse Rate: 80 (07/13 0520) Intake/Output from previous day: 07/12 0701 - 07/13 0700 In: 303.5 [I.V.:238.5; NG/GT:15; IV Piggyback:50] Out: 3329 [Urine:1225; Emesis/NG output:400; Drains:20] Intake/Output from this shift:    Labs:  Recent Labs  03/23/15 0425  WBC 7.1  HGB 10.2*  HCT 34.8*  PLT 272     Recent Labs  03/23/15 0425 03/25/15 0540  NA 133* 136  K 4.0 4.3  CL 103 104  CO2 25 25  GLUCOSE 121* 117*  BUN 35* 36*  CREATININE 1.10* 1.14*  CALCIUM 9.0 9.4  MG 2.0  --   PHOS 3.6  --   PROT 7.2  --   ALBUMIN 2.8*  --   AST 35  --   ALT 46  --   ALKPHOS 109  --   BILITOT 0.3  --   PREALBUMIN 31.7  --   TRIG 135  --    Estimated Creatinine Clearance: 50 mL/min (by C-G formula based on Cr of 1.14).    Recent Labs  03/24/15 0811 03/24/15 1636 03/25/15  GLUCAP 117* 118* 115*    Insulin Requirements: sensitive scale Novolog q8h, 0 units administered yesterday  Current Nutrition: NPO  IVF: NS @ [place] access: 7/3 TPN start date: 7/3  ASSESSMENT  HPI: [name] admitted 7/1 with abdominal pain. Per CT: mesh erosion (previous hernia repair) into small bowel with abscess and EC fistula. Bowel rest planned, TPN initiated with pharmacy dosing assistance requested.  Per surgery, likely that she will need to go to the operating room for laparotomy, small bowel resection, and repair of abdominal wall defect.  Significant events:  7/2: unable to place  PICC; TPN postponed 7/3: TPN started 7/4: Feraheme 510 mg IV x 1 administered for iron-deficiency anemia. 7/5: Patient accidentally removed NGT during sleep early this AM; reinserted by RN. 7/11: Fistula appears closed on fluoroscopy 7/13: Per surgery, planning to d/c home on [facility] TPN.  Would like to transition to cyclic TPN  Today, 51/88/4166:   Glucose - controlled, at goal <150 mg/dL  Electrolytes -  wnl  Renal - SCr improved to baseline, CrCl~52 ml/min (7/11)  LFTs - WNL (7/11)  TGs - WNL, 82 (7/4), 135 (7/11)  Prealbumin - baseline 5.5 (7/3), 31.7 (7/11)  NUTRITIONAL GOALS  RD recommendations from 7/8: 1650 - 1850 KCal/day 85-100 grams protein//day  Clinimix E 5/15 + 20% fat emulsion cycled as below will provide: 85 g/day protein,  1650 Kcal/day.  PLAN  At 1800 today transition to cyclic TPN  Clinimix E 0/63 cycled as follows Infuse 1700 mL over 18 hr : 50 mL/hr x 1 hr, then 100 mL/hr x 16 hrs, then 50 mL/hr x 1 hr  20% fat emulsion at 13 ml/hr x 18 hr.  TPN to contain standard multivitamins and trace elements.  Continue NS at [facility]   Change CBG checks to 2 hrs post-start, 0600 (on), 1 hr post-stop, 1600 (off)  TPN lab panels on Mondays & Thursdays.   Follow clinical course daily   Reuel Boom, PharmD, BCPS Pager: [phone] 03/25/2015, 7:51 AM

## 2015-03-25 NOTE — Progress Notes (Signed)
Received notification that plan for patient is to d/c home with TNA, no plans for surgery during this hospitalization. HH has been arranged with [facility], contacted them to begin teaching with patient and family. Spoke with patient at bedside, she wants [place] to discuss with her daughter to see if she can help manage her at home. Called daughter, left a message for her to call me. Discussed with patient Marissa [name] vs SNF options, she does not want to go to SNF but wants [place] to talk with her daughter first. Will await call back.

## 2015-03-25 NOTE — Progress Notes (Signed)
Nutrition Follow-up  DOCUMENTATION CODES:   Obesity unspecified  INTERVENTION:   TPN cycles per Pharmacy RD to continue to monitor  NUTRITION DIAGNOSIS:   Inadequate oral intake related to inability to eat as evidenced by NPO status.  Ongoing.  GOAL:   Patient will meet greater than or equal to 90% of their needs  Meeting with TPN.  MONITOR:   Labs, Weight trends, Skin, I & O's, Other (Comment) (TPN tolerance)  REASON FOR ASSESSMENT:   Consult New TPN/TNA  ASSESSMENT:   74 year old female with history of CAD s/p multiple stents, chronic diastolic heart failure, diabetes mellitus with peripheral neuropathy, DVT no longer on anticoagulation secondary to recurrent diverticular and gastric bleeding, gout, obstructive sleep apnea, and multiple abdominal surgeries.She presented with abdominal swelling, erythema, pain, warmth, and anorexia.  Pt being transitioned to cyclic TPN today. Per surgery note, pt to be discharged with home TPN.  Per pharmacy note, pt to receive Clinimix E 5/15 + 20% fat emulsion cycled as below will provide: 85 g/day protein, 1650 Kcal/day.  Plan per Pharmacy: At 1800 today transition to cyclic TPN  Clinimix E 4/32 cycled as follows  Infuse 1700 mL over 18 hr : 50 mL/hr x 1 hr, then 100 mL/hr x 16 hrs, then 50 mL/hr x 1 hr  20% fat emulsion at 13 ml/hr x 18 hr.  Labs reviewed: CBGs: 115-128 Elevated BUN & Creatinine  Diet Order:  Diet NPO time specified .TPN (CLINIMIX-E) Adult .TPN (CLINIMIX-E) Adult  Skin:  Wound (see comment) (abdominal puncture)  Last BM:  7/12  Height:   Ht Readings from Last 1 Encounters:  03/14/15 5\' 4"  (1.626 m)    Weight:   Wt Readings from Last 1 Encounters:  03/14/15 222 lb 10.6 oz (101 kg)    Ideal Body Weight:  54.5 kg  Wt Readings from Last 10 Encounters:  03/14/15 222 lb 10.6 oz (101 kg)  03/05/15 219 lb (99.338 kg)  01/14/15 223 lb (101.152 kg)  01/07/15 216 lb (97.977 kg)  12/29/14 229  lb 11.5 oz (104.2 kg)  12/20/14 226 lb 13.7 oz (102.9 kg)  11/26/14 227 lb 4 oz (103.08 kg)  07/21/14 234 lb 6.4 oz (106.323 kg)  07/04/14 230 lb (104.327 kg)  07/01/14 230 lb (104.327 kg)    BMI:  Body mass index is 38.2 kg/(m^2).  Estimated Nutritional Needs:   Kcal:  1650-1850  Protein:  75-85g  Fluid:  1.8L/day  EDUCATION NEEDS:   No education needs identified at this time  Clayton Bibles, MS, RD, LDN Pager: [phone] After Hours Pager: [phone]

## 2015-03-25 NOTE — Progress Notes (Signed)
[facility]  Patient Status:  New pt this admission for [facility]  [facility] is providing the following services: HHRN and Home TNA Pharmacy team to provide/support home TNA for pt . [facility] hospital coordinator will work with pt and daughter prior to DC home for in hospital TNA training to support independence at home.    If patient discharges after hours, please call [phone].   [place]- Fri 8-5PM   Cell [phone] 03/25/2015, 10:24 AM

## 2015-03-25 NOTE — Progress Notes (Signed)
Patient ID: Marissa [name], female   DOB: 1941-04-16, 74 y.o.   MRN: [id number]     [facility]      [address] [place], [place] [address]    Phone: [phone] FAX: [phone]     Subjective: In  Bed.  Odd affect.   Objective:  Vital signs:  Filed Vitals:   03/24/15 1400 03/24/15 2154 03/25/15 0520 03/25/15 0939  BP: 124/67 135/62 136/62 122/72  Pulse: 79 77 80   Temp: 98.2 F (36.8 C) 97.7 F (36.5 C) 97.9 F (36.6 C)   TempSrc: Oral Oral Oral   Resp: $Remo'16 18 16   'nrITc$ Height:      Weight:      SpO2: 100% 100% 100%     Last BM Date: 03/24/15  Intake/Output   Yesterday:  07/12 0701 - 07/13 0700 In: 303.5 [I.V.:238.5; NG/GT:15; IV Piggyback:50] Out: 5643 [Urine:1225; Emesis/NG output:400; Drains:20] This shift:    I/O last 3 completed shifts: In: 718 [I.V.:603; NG/GT:15; IV Piggyback:100] Out: 2978 [Urine:2525; Emesis/NG output:430; Drains:20; Stool:3]    Physical Exam: General: Pt awake/alert/oriented x4 in no acute distress Chest: cta. No chest wall pain w good excursion CV:  Pulses intact.  Regular rhythm MS: Normal AROM mjr joints.  No obvious deformity Abdomen: Soft.  Nondistended. Non tender.  eakin's pouch little milky output.  No evidence of peritonitis.  No incarcerated hernias. Ext:  SCDs BLE.  No mjr edema.  No cyanosis Skin: No petechiae / purpura   Problem List:   Principal Problem:   Enterocutaneous fistula Active Problems:   Gout   OSA (obstructive sleep apnea)   Essential hypertension   CAD S/P OM BMS 8/07, ISR-OM DES 8/09   GERD   Chronic kidney disease, stage 3   Hyperlipidemia with target LDL less than 100   Chronic diastolic CHF (congestive heart failure)   Diverticulosis of colon with hemorrhage   Anemia, iron deficiency   Abdominal wall abscess    Results:   Labs: Results for orders placed or performed during the hospital encounter of 03/13/15 (from the past 48 hour(s))   Glucose, capillary     Status: Abnormal   Collection Time: 03/23/15  4:18 PM  Result Value Ref Range   Glucose-Capillary 101 (H) 65 - 99 mg/dL  Glucose, capillary     Status: Abnormal   Collection Time: 03/23/15 11:54 PM  Result Value Ref Range   Glucose-Capillary 102 (H) 65 - 99 mg/dL  Glucose, capillary     Status: Abnormal   Collection Time: 03/24/15  8:11 AM  Result Value Ref Range   Glucose-Capillary 117 (H) 65 - 99 mg/dL  Glucose, capillary     Status: Abnormal   Collection Time: 03/24/15  4:36 PM  Result Value Ref Range   Glucose-Capillary 118 (H) 65 - 99 mg/dL  Glucose, capillary     Status: Abnormal   Collection Time: 03/25/15 12:00 AM  Result Value Ref Range   Glucose-Capillary 115 (H) 65 - 99 mg/dL  Basic metabolic panel     Status: Abnormal   Collection Time: 03/25/15  5:40 AM  Result Value Ref Range   Sodium 136 135 - 145 mmol/L   Potassium 4.3 3.5 - 5.1 mmol/L   Chloride 104 101 - 111 mmol/L   CO2 25 22 - 32 mmol/L   Glucose, Bld 117 (H) 65 - 99 mg/dL   BUN 36 (H) 6 - 20 mg/dL   Creatinine, Ser 1.14 (H)  0.44 - 1.00 mg/dL   Calcium 9.4 8.9 - 10.3 mg/dL   GFR calc non Af Amer 46 (L) >60 mL/min   GFR calc Af Amer 54 (L) >60 mL/min    Comment: (NOTE) The eGFR has been calculated using the CKD EPI equation. This calculation has not been validated in all clinical situations. eGFR's persistently <60 mL/min signify possible Chronic Kidney Disease.    Anion gap 7 5 - 15  Glucose, capillary     Status: Abnormal   Collection Time: 03/25/15  8:20 AM  Result Value Ref Range   Glucose-Capillary 128 (H) 65 - 99 mg/dL   Comment 1 Notify RN    Comment 2 Document in Chart     Imaging / Studies: Dg Sinus/fist Tube Chk-non Gi  03/23/2015   CLINICAL DATA:  History of enterocutaneous fistula.  EXAM: ABSCESS INJECTION  CONTRAST:  56mL OMNIPAQUE IOHEXOL 300 MG/ML  SOLN  FLUOROSCOPY TIME:  Radiation Exposure Index (as provided by the fluoroscopic device):  If the device does  not provide the exposure index:  Fluoroscopy Time (in minutes and seconds):  43 seconds  Number of Acquired Images:  7  COMPARISON:  03/20/2015  FINDINGS: A narrow gauge catheter was inserted into the cutaneous fistula. Retention balloon was filled with 5 cc of air. Using gentle pressure water-soluble contrast material was injected through the catheter filling the anterior abdominal wall abscess. With continued injection contrast material spilled from the abscess cavity onto the skin surface. No opacification of bowel loops achieved.  IMPRESSION: 1. Contrast injection into cutaneous fistula opacifies abdominal wall abscess. No opacification of bowel loops identified.   Electronically Signed   By: Kerby Moors M.D.   On: 03/23/2015 14:36    Medications / Allergies:  Scheduled Meds: . amLODipine  10 mg Oral Daily  . colchicine  0.6 mg Oral BID  . diclofenac sodium  4 g Topical QID  . ertapenem  1 g Intravenous Q24H  . heparin subcutaneous  5,000 Units Subcutaneous 3 times per day  . insulin aspart  0-9 Units Subcutaneous Q8H  . metoprolol  5 mg Intravenous 4 times per day  . pantoprazole (PROTONIX) IV  40 mg Intravenous QHS  . sodium chloride  10-40 mL Intracatheter Q12H   Continuous Infusions: . Marland KitchenTPN (CLINIMIX-E) Adult 83 mL/hr at 03/24/15 1700   And  . fat emulsion 240 mL (03/24/15 1701)  . 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  CKD-stable Gout-stop steroids 7/7. C/w colchicine  CV-Hx CAD, HTN-.metoprolol, change to home dose and PO.  Amlodipine. cards cleared for surgery.  OSA-CPAP H/o diagnosis of DVT- SCD's and heparin Diabetes mellitus-diet controlled. A1c 5.7%. SSI for TPN.  Anemia of chronic disease-stable Body mass index is 38.2 kg/(m^2). PCM/TNA ID-Invanz D#12 for abdominal wall abscess.  ?duration and if okay to change to PO if further therapy  is needed. Dispo-start planning for DC home with [place] TPN.   Erby Pian, [facility] Pager [phone](7A-4:30P)   03/25/2015 9:56 AM

## 2015-03-25 NOTE — Progress Notes (Signed)
Physical Therapy Treatment Patient Details Name: Marissa [name] MRN: [id number] DOB: 1941/03/04 Today's Date: 03/25/2015    History of Present Illness 74 year old female with history of CAD s/p multiple stents, chronic diastolic heart failure, diabetes mellitus with peripheral neuropathy, DVT no longer on anticoagulation secondary to recurrent diverticular and gastric bleeding, gout, obstructive sleep apnea, and multiple abdominal surgeries  Pt admitted for Enterocutaneous fistula and also with gout flare.    PT Comments    Progressing well; does not want to use RW but still needs UE support (likes to use IV pole) or she is unsteady with gait;  Follow Up Recommendations  Home health PT;Supervision/Assistance - 24 hour     Equipment Recommendations  None recommended by PT    Recommendations for Other Services       Precautions / Restrictions Precautions Precautions: Fall Precaution Comments: EC fistula pouch Restrictions Weight Bearing Restrictions: No    Mobility  Bed Mobility Overal bed mobility: Needs Assistance Bed Mobility: Supine to Sit     Supine to sit: Supervision;HOB elevated     General bed mobility comments: incr time, requires encouragement to initiate movement  Transfers Overall transfer level: Needs assistance Equipment used: None (IV pole push, pt refuses RW) Transfers: Sit to/from [facility] Sit to Stand: Supervision Stand pivot transfers: Min guard       General transfer comment: min/guard to supervision for lines and safety;   Ambulation/Gait Ambulation/Gait assistance: Supervision Ambulation Distance (Feet): 400 Feet (88' without AD and min/guard, pt does not want assist) Assistive device: None (IV pole) Gait Pattern/deviations: Step-through pattern;Trunk flexed;Decreased stride length;Drifts right/left Gait velocity: decreased   General Gait Details: cues to avoid use of rail inhallway and for short distance amb without  support of IV pole; close supervision to min/guard without use of pole; pt is unsteady without UE support    Stairs            Wheelchair Mobility    Modified Rankin (Stroke Patients Only)       Balance                                    Cognition Arousal/Alertness: Awake/alert Behavior During Therapy: Flat affect;WFL for tasks assessed/performed Overall Cognitive Status: Within Functional Limits for tasks assessed                      Exercises      General Comments        Pertinent Vitals/Pain Pain Assessment: No/denies pain    Home Living                      Prior Function            PT Goals (current goals can now be found in the care plan section) Acute Rehab PT Goals Patient Stated Goal: none PT Goal Formulation: With patient Time For Goal Achievement: 04/01/15 Potential to Achieve Goals: Good Progress towards PT goals: Progressing toward goals    Frequency  Min 3X/week    PT Plan Current plan remains appropriate    Co-evaluation             End of Session   Activity Tolerance: Patient tolerated treatment well Patient left: in chair;with call bell/phone within reach     Time: 1333-1353 PT Time Calculation (min) (ACUTE ONLY): 20 min  Charges:  $Gait Training: 8-22 mins  G CodesKenyon Ana 03/25/2015, 2:03 PM

## 2015-03-26 LAB — GLUCOSE, CAPILLARY
GLUCOSE-CAPILLARY: 118 mg/dL — AB (ref 65–99)
GLUCOSE-CAPILLARY: 85 mg/dL (ref 65–99)
GLUCOSE-CAPILLARY: 89 mg/dL (ref 65–99)
Glucose-Capillary: 111 mg/dL — ABNORMAL HIGH (ref 65–99)
Glucose-Capillary: 94 mg/dL (ref 65–99)

## 2015-03-26 LAB — COMPREHENSIVE METABOLIC PANEL
ALK PHOS: 108 U/L (ref 38–126)
ALT: 44 U/L (ref 14–54)
AST: 40 U/L (ref 15–41)
Albumin: 2.7 g/dL — ABNORMAL LOW (ref 3.5–5.0)
Anion gap: 5 (ref 5–15)
BUN: 41 mg/dL — ABNORMAL HIGH (ref 6–20)
CALCIUM: 9.2 mg/dL (ref 8.9–10.3)
CO2: 24 mmol/L (ref 22–32)
CREATININE: 1.16 mg/dL — AB (ref 0.44–1.00)
Chloride: 106 mmol/L (ref 101–111)
GFR calc Af Amer: 52 mL/min — ABNORMAL LOW (ref >60)
GFR, EST NON AFRICAN AMERICAN: 45 mL/min — AB (ref >60)
GLUCOSE: 118 mg/dL — AB (ref 65–99)
Potassium: 4.1 mmol/L (ref 3.5–5.1)
SODIUM: 135 mmol/L (ref 135–145)
Total Bilirubin: 0.4 mg/dL (ref 0.3–1.2)
Total Protein: 7.2 g/dL (ref 6.5–8.1)

## 2015-03-26 LAB — PHOSPHORUS: Phosphorus: 3.5 mg/dL (ref 2.5–4.6)

## 2015-03-26 LAB — MAGNESIUM: MAGNESIUM: 2.1 mg/dL (ref 1.7–2.4)

## 2015-03-26 LAB — CLOSTRIDIUM DIFFICILE BY PCR: Toxigenic C. Difficile by PCR: NEGATIVE

## 2015-03-26 MED ORDER — INSULIN ASPART 100 UNIT/ML ~~LOC~~ SOLN: 0.0000 [IU] | SUBCUTANEOUS | Status: DC

## 2015-03-26 MED ORDER — FAT EMULSION 20 % IV EMUL
240.0000 mL | INTRAVENOUS | Status: AC
  Administered 2015-03-26: 240 mL via INTRAVENOUS
  Filled 2015-03-26: qty 250

## 2015-03-26 MED ORDER — TRACE MINERALS CR-CU-MN-SE-ZN 10-1000-500-60 MCG/ML IV SOLN
INTRAVENOUS | Status: AC
  Administered 2015-03-26: 18:00:00 via INTRAVENOUS
  Filled 2015-03-26: qty 1700

## 2015-03-26 NOTE — Progress Notes (Signed)
Subjective: Marissa [name] looks fine and got up to bedside commode and got back to bed without help.  Pouch is leaking at the base and Marissa [name] was unaware. We switched her to Cyclic TNA yesterday, and her glucose looks fine.  Marissa [name] is comfortable with the NG out.  Objective: Vital signs in last 24 hours: Temp:  [97.8 F (36.6 C)-98.5 F (36.9 C)] 98.5 F (36.9 C) (07/14 0558) Pulse Rate:  [72-84] 84 (07/14 0558) Resp:  [16-18] 16 (07/14 0558) BP: (102-126)/(56-69) 114/66 mmHg (07/14 0558) SpO2:  [98 %-100 %] 98 % (07/14 0558) Last BM Date: 03/26/15 NPO  NG out 5 ml from Eakin's recorded, and there is very little in it now.  It is leaking from the base, but not much on her abdomen. 3 stools yesterday Afebrile, VSS CMP OK   Last sinus/fistula exam 03/23/15 Intake/Output from previous day: 07/13 0701 - 07/14 0700 In: 8086.7 [I.V.:104.2; IV Piggyback:50; TPN:7932.5] Out: 355 [Urine:350; Drains:5] Intake/Output this shift:    General appearance: alert, cooperative and no distress Resp: clear to auscultation bilaterally GI: soft, not tender, minimal drainage in the Eakin's pouch.  + BS, and BM  Lab Results:  No results for input(s): WBC, HGB, HCT, PLT in the last 72 hours.  BMET  Recent Labs  03/25/15 0540 03/26/15 0355  NA 136 135  K 4.3 4.1  CL 104 106  CO2 25 24  GLUCOSE 117* 118*  BUN 36* 41*  CREATININE 1.14* 1.16*  CALCIUM 9.4 9.2   PT/INR No results for input(s): LABPROT, INR in the last 72 hours.   Recent Labs Lab 03/20/15 0455 03/23/15 0425 03/26/15 0355  AST 36 35 40  ALT 48 46 44  ALKPHOS 92 109 108  BILITOT 0.4 0.3 0.4  PROT 6.7 7.2 7.2  ALBUMIN 2.4* 2.8* 2.7*     Lipase     Component Value Date/Time   LIPASE 27 12/26/2014 1815     Studies/Results: No results found.  Medications: . amLODipine  10 mg Oral Daily  . colchicine  0.6 mg Oral BID  . diclofenac sodium  4 g Topical QID  . ertapenem  1 g Intravenous Q24H  . heparin subcutaneous  5,000  Units Subcutaneous 3 times per day  . insulin aspart  0-9 Units Subcutaneous 4 times per day  . insulin aspart  0-9 Units Subcutaneous 4 times per day  . metoprolol tartrate  25 mg Oral q morning - 10a  . pantoprazole (PROTONIX) IV  40 mg Intravenous QHS  . 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  CKD-stable Gout-stop steroids 7/7. C/w colchicine  CV-Hx CAD, HTN-.metoprolol, change to home dose and PO. Amlodipine. cards cleared for surgery.  OSA-CPAP H/o diagnosis of DVT- SCD's and heparin Diabetes mellitus-diet controlled. A1c 5.7%. SSI for TPN.  Anemia of chronic disease-stable Body mass index is 38.2 kg/(m^2). PCM/TNA ID-Invanz Day #14 for abdominal wall abscess. ?duration and if okay to change to PO if further therapy is needed. DVT:  Heparin/SCD Dispo-start planning for DC home with HH TPN.   Plan:  We plan to stop antibiotics today.  Continue her NPO status, and then plan for discharge home with Home Health care care.  Follow up per DR. Dalbert Batman or Hassell Done to be discussed along with when we want to do the next  drain study. The patients daughter Marissa [name] ask about plans and wanted to know why we were not operating on her.  Marissa [name] request that Marissa [name] be allowed to speak to Dr. Hassell Done.  I have given him that information along with the patient's cell phone number [phone].   LOS: 13 days    Marissa [name] 03/26/2015

## 2015-03-26 NOTE — Progress Notes (Addendum)
Physical Therapy Treatment Patient Details Name: Marissa [name] MRN: [id number] DOB: [birth year] Today's Date: 03/26/2015    History of Present Illness 74 year old female with history of CAD s/p multiple stents, chronic diastolic heart failure, diabetes mellitus with peripheral neuropathy, DVT no longer on anticoagulation secondary to recurrent diverticular and gastric bleeding, gout, obstructive sleep apnea, and multiple abdominal surgeries  Pt admitted for Enterocutaneous fistula and also with gout flare.    PT Comments    Progressing well with mobility. Pt remains unsteady at times when ambulating without an assistive device. Pt tolerated activity well.  Dyspnea 2/4. Pt has a RW available at home to use if she so chooses.  Follow Up Recommendations  Home health PT;Supervision for OOB/mobility (initially)     Equipment Recommendations  None recommended by PT    Recommendations for Other Services       Precautions / Restrictions Precautions Precautions: Fall Precaution Comments: EC fistula pouch Restrictions Weight Bearing Restrictions: No    Mobility  Bed Mobility               General bed mobility comments: pt oob in recliner  Transfers     Transfers: Sit to/from Stand Sit to Stand: Modified independent (Device/Increase time)            Ambulation/Gait Ambulation/Gait assistance: Supervision;Min guard Ambulation Distance (Feet): 500 Feet Assistive device: None Gait Pattern/deviations: Step-through pattern;Trunk flexed     General Gait Details:  close supervision to min/guard for ambulation. VCs for safety. Weight appears to be shifted slightly forward of BOS at times. NO overt LOB but pt is unsteady at times.    Stairs            Wheelchair Mobility    Modified Rankin (Stroke Patients Only)       Balance Overall balance assessment: Needs assistance         Standing balance support: No upper extremity supported;During functional  activity Standing balance-Leahy Scale: Good                      Cognition Arousal/Alertness: Awake/alert Behavior During Therapy: WFL for tasks assessed/performed Overall Cognitive Status: Within Functional Limits for tasks assessed                      Exercises      General Comments        Pertinent Vitals/Pain Pain Assessment: No/denies pain    Home Living                      Prior Function            PT Goals (current goals can now be found in the care plan section) Progress towards PT goals: Progressing toward goals    Frequency  Min 3X/week    PT Plan Current plan remains appropriate    Co-evaluation             End of Session   Activity Tolerance: Patient tolerated treatment well Patient left: in chair;with call bell/phone within reach     Time: 1432-1447 PT Time Calculation (min) (ACUTE ONLY): 15 min  Charges:  $Gait Training: 8-22 mins                    G Codes:      Weston Anna, MPT Pager: [phone]

## 2015-03-26 NOTE — Progress Notes (Signed)
PARENTERAL NUTRITION CONSULT NOTE - FOLLOW UP  Pharmacy Consult for cyclic TPN Indication: Enterocutaneous fistula  No Known Allergies  Patient Measurements: Height: 5\' 4"  (162.6 cm) Weight: 222 lb 10.6 oz (101 kg) IBW/kg (Calculated) : 54.7 Adjusted Body Weight: 70 kg Usual Weight: 101 kg  Vital Signs: Temp: 98.5 F (36.9 C) (07/14 0558) Temp Source: Oral (07/14 0558) BP: 114/66 mmHg (07/14 0558) Pulse Rate: 84 (07/14 0558) Intake/Output from previous day: 07/13 0701 - 07/14 0700 In: 8086.7 [I.V.:104.2; IV Piggyback:50; TPN:7932.5] Out: 355 [Urine:350; Drains:5] Intake/Output from this shift:    Labs: No results for input(s): WBC, HGB, HCT, PLT, APTT, INR in the last 72 hours.   Recent Labs  03/25/15 0540 03/26/15 0355  NA 136 135  K 4.3 4.1  CL 104 106  CO2 25 24  GLUCOSE 117* 118*  BUN 36* 41*  CREATININE 1.14* 1.16*  CALCIUM 9.4 9.2  MG  --  2.1  PHOS  --  3.5  PROT  --  7.2  ALBUMIN  --  2.7*  AST  --  40  ALT  --  44  ALKPHOS  --  108  BILITOT  --  0.4   Estimated Creatinine Clearance: 49.2 mL/min (by C-G formula based on Cr of 1.16).    Recent Labs  03/25/15 2350 03/26/15 0613 03/26/15 0756  GLUCAP 107* 111* 118*    Insulin Requirements: sensitive scale Novolog q8h, 1 unit administered yesterday  Current Nutrition: NPO  IVF: NS @ [place] access: 7/3 TPN start date: 7/3  ASSESSMENT  HPI: [name] admitted 7/1 with abdominal pain. Per CT: mesh erosion (previous hernia repair) into small bowel with abscess and EC fistula. Bowel rest planned, TPN initiated with pharmacy dosing assistance requested.  Per surgery, likely that she will need to go to the operating room for laparotomy, small bowel resection, and repair of abdominal wall defect.  Significant events:  7/2: unable to place PICC; TPN postponed 7/3: TPN started 7/4:  Feraheme 510 mg IV x 1 administered for iron-deficiency anemia. 7/5: Patient accidentally removed NGT during sleep early this AM; reinserted by RN. 7/11: Fistula appears closed on fluoroscopy 7/13: Per surgery, planning to d/c home on [facility] TPN.  Began cyclic TPN  Today, 71/02/2693:   Glucose - controlled, at goal <150 mg/dL  Electrolytes -  wnl  Renal - SCr improved to baseline, CrCl~52 ml/min (7/11)  LFTs - WNL (7/11)  TGs - WNL, 82 (7/4), 135 (7/11)  Prealbumin - baseline 5.5 (7/3), 31.7 (7/11)  NUTRITIONAL GOALS  RD recommendations from 7/13: 1650-1850 KCal/day 75-85 grams protein//day  Clinimix E 5/15 + 20% fat emulsion cycled as below will provide: 85 g/day protein,  1660 Kcal/day (note slight bump in Kcal today d/t rounding IVFE rate yesterday)  PLAN  At 1800 today  Labs stable and at goal; patient appears to be tolerating TPN and cycling very well.  Will transition to 12-hr cycle tonight.  Clinimix E 5/15 cycled as follows Infuse 1700 mL over 12 hr : 50 mL/hr x 1 hr, then 160 mL/hr x 10 hrs, then 50 mL/hr x 1 hr  20% fat emulsion at 20 ml/hr x 12 hr.  TPN to contain standard multivitamins and trace elements.  Continue NS at [facility].   Change CBG checks to 2000 (2 hrs post-start), 2200 (on), 0700 (1 hr post-stop), 1000 (off)  TPN lab panels on Mondays & Thursdays.   Follow clinical course daily   Reuel Boom, PharmD, BCPS  Pager: [phone] 03/26/2015, 8:44 AM

## 2015-03-27 LAB — GLUCOSE, CAPILLARY
GLUCOSE-CAPILLARY: 82 mg/dL (ref 65–99)
GLUCOSE-CAPILLARY: 87 mg/dL (ref 65–99)
GLUCOSE-CAPILLARY: 91 mg/dL (ref 65–99)

## 2015-03-27 LAB — CBC
HCT: 27.7 % — ABNORMAL LOW (ref 36.0–46.0)
HEMOGLOBIN: 8.4 g/dL — AB (ref 12.0–15.0)
MCH: 21.4 pg — ABNORMAL LOW (ref 26.0–34.0)
MCHC: 30.3 g/dL (ref 30.0–36.0)
MCV: 70.7 fL — AB (ref 78.0–100.0)
PLATELETS: 213 10*3/uL (ref 150–400)
RBC: 3.92 MIL/uL (ref 3.87–5.11)
RDW: 15.9 % — ABNORMAL HIGH (ref 11.5–15.5)
WBC: 5.5 10*3/uL (ref 4.0–10.5)

## 2015-03-27 MED ORDER — SODIUM CHLORIDE 0.9 % IJ SOLN: 10.0000 mL | INTRAMUSCULAR | Status: DC | PRN

## 2015-03-27 MED ORDER — SODIUM CHLORIDE 0.9 % IJ SOLN: 10.0000 mL | Freq: Two times a day (BID) | INTRAMUSCULAR | Status: DC

## 2015-03-27 NOTE — Discharge Instructions (Signed)
You have a leak in your small bowel.  We hope this will seal off by you not eating.   You are on TPN which is nutrition that bypasses your gut while we allow time to heal. You will need to be followed in our office in 2-3 weeks so be sure to schedule an appointment. Please call [facility] [place] [facility] with any questions or concerns.

## 2015-03-27 NOTE — Care Management Note (Signed)
Case Management Note  Patient Details  Name: Jalyah Weinheimer MRN: [id number] Date of Birth: 1940-10-30  Subjective/Objective:                    Action/Plan:   Expected Discharge Date:                  Expected Discharge Plan:  [place]  In-House Referral:     Discharge planning Services  CM Consult  Post Acute Care Choice:    Choice offered to:     DME Arranged:    DME Agency:     HH Arranged:  RN, PT, OT [place] Agency:  [place]  Status of Service:  In process, will continue to follow  Medicare Important Message Given:  Yes-second notification given Date Medicare IM Given:    Medicare IM give by:    Date Additional Medicare IM Given:    Additional Medicare Important Message give by:     If discussed at [place] of Stay Meetings, dates discussed:    Additional Comments: [place], RN is working with daughter [name] and pt for [facility] needs.   Purcell Mouton, RN 03/27/2015, 9:53 AM

## 2015-03-27 NOTE — Plan of Care (Signed)
Problem: Phase III Progression Outcomes Goal: IV/normal saline lock discontinued Outcome: Not Met (add Reason) Home with PICC

## 2015-03-27 NOTE — Discharge Summary (Signed)
Physician Discharge Summary  Marissa [name] QHU:[id number] DOB: [birth year] DOA: 03/13/2015  PCP: [name], MD  Consultation: internal medicine   Cardiology   WOC  Admit date: 03/13/2015 Discharge date: 03/27/2015  Recommendations for Outpatient Follow-up:   Follow-up Information    Follow up with [facility].   Why:  home health physical and occupational therapy and nurse   Contact information:   [address] [place] [address] [phone]       Follow up with Maia Petties., MD. Schedule an appointment as soon as possible for a visit in 2 weeks.   Specialty:  General Surgery   Contact information:   [place] [address] [phone]      Discharge Diagnoses:  1. Enterocutaneous fistula 2. Large abdominal wall abscess 3. Protein calorie malnutrition 4. CKD 5. Gout 6. Hypertension 7. Anemia of chronic disease 8. OSA 9. CAD   Surgical Procedure: none  Discharge Condition: stable Disposition: home with [facility]  Diet recommendation: NPO x meds  Filed Weights   03/14/15 0226  Weight: 101 kg (222 lb 10.6 oz)     Filed Vitals:   03/27/15 0611  BP: 132/78  Pulse: 78  Temp: 98.6 F (37 C)  Resp: 16     Hospital Course:  Marissa [name] is a 74 year old female with a surgical history of several laparotomies last being in 2013 4 days after a robotic assisted hysterectomy for foul smelling drainage from her wound c/w fistulous drainage.  She was taken to the OR, but no bowel resection was done as no evident leak was found.  Her wound was left open and healed by secondary intention.  She was doing relatively well until 1 month ago.  She developed vague abdominal pain and then noticed redness and swelling at the scar.  She then came to the ED for further evaluation.  CT of A/P showed an EC fistula with a large subcutaneous abscess.  She was therefore admitted and shortly thereafter the abscess spontaneously drained  and a Eakin's pouch was applied for control.  She was placed on bowel rest, TPN and Invanz.  Repeat CT of A/P showed a decrease size of complex EC fistula.  This was followed by IR injection to identify the location, however, this was unsuccessful, but thought to be somewhere in the small bowel. Initial thought was to operative after some time on TPN, however, given the complexity and patients preference, it was decided to continue with non operative management and allow EC fistula to heal on its own.  NGT was removed on HD#13.  Colbert Ewing was stopped after total of 14 days.  She did not have any fevers or white count during her admission. Output decreased overtime, but continued to look purulent/milky, no feculent output was appreciated.    The patient had an acute gout attack and was briefly treated with steroids and transitioned back to colchicine.  Remaining medical problems remained stable including blood pressure which stabilized with resuming home meds.  Treated with insulin due to TPN, but A1c was 5.7 and does not take any glucostabilizers at home.  Medicine therefore signed off.  Cardiology was consulted for anticipated surgery due to history of CAD, but did not require further testing and cleared for surgery.  On HD#15 she was felt stable for discharge home with home health for cyclic TPN, and wound care.  Follow up in our office in 2-3 weeks.    Discharge Instructions  Medication List    STOP taking these medications        Iron 325 (65 FE) MG Tabs      TAKE these medications        amLODipine 10 MG tablet  Commonly known as:  NORVASC  Take 1 tablet (10 mg total) by mouth daily.     atorvastatin 40 MG tablet  Commonly known as:  LIPITOR  Take 1 tablet (40 mg total) by mouth daily.     colchicine 0.6 MG tablet  Take 1 tablet (0.6 mg total) by mouth 2 (two) times daily.     cyanocobalamin 2000 MCG tablet  Take 1 tablet (2,000 mcg total) by mouth daily.     furosemide 20  MG tablet  Commonly known as:  LASIX  Take 20 mg by mouth daily as needed for fluid.     metoprolol tartrate 25 MG tablet  Commonly known as:  LOPRESSOR  Take 1 tablet (25 mg total) by mouth every morning.     nitroGLYCERIN 0.4 MG SL tablet  Commonly known as:  NITROSTAT  Place 1 tablet (0.4 mg total) under the tongue every 5 (five) minutes as needed for chest pain.     pantoprazole 40 MG tablet  Commonly known as:  PROTONIX  Take 1 tablet (40 mg total) by mouth daily.     sodium chloride 0.9 % injection  10-40 mLs by Intracatheter route every 12 (twelve) hours.     sodium chloride 0.9 % injection  10-40 mLs by Intracatheter route as needed (flush).     VOLTAREN 1 % Gel  Generic drug:  diclofenac sodium  Apply 2 g topically 4 (four) times daily as needed (pain).           Follow-up Information    Follow up with [facility].   Why:  home health physical and occupational therapy and nurse   Contact information:   [address] [place] [address] [phone]       Follow up with Maia Petties., MD. Schedule an appointment as soon as possible for a visit in 2 weeks.   Specialty:  General Surgery   Contact information:   [place]  [address] [phone]        The results of significant diagnostics from this hospitalization (including imaging, microbiology, ancillary and laboratory) are listed below for reference.    Significant Diagnostic Studies: Ct Abdomen Pelvis Wo Contrast  03/20/2015   CLINICAL DATA:  Enterocutaneous fistula.  Nausea.  EXAM: CT ABDOMEN AND PELVIS WITHOUT CONTRAST  TECHNIQUE: Multidetector CT imaging of the abdomen and pelvis was performed following the standard protocol without IV contrast.  COMPARISON:  03/13/2015  FINDINGS: Lower chest: No acute findings.  Hepatobiliary:  No mass visualized on this unenhanced exam.  Pancreas: No mass or inflammatory process visualized on this unenhanced  exam.  Spleen:  Within normal limits in size.  Adrenal Glands:  No masses identified.  Kidneys/Urinary tract: No evidence of urolithiasis or hydronephrosis. Bilateral renal parenchymal scarring appears stable.  Stomach/Bowel/Peritoneum: Diffuse colonic diverticulosis is again demonstrated, however there is no evidence of diverticulitis.  Decreased size of extraluminal collection containing oral contrast and gas is seen within the anterior abdominal wall soft tissues near the level of the umbilicus. This currently measures 2.3 x 5.6 cm compared to 6.8 x 9.1 cm previously. Gas is also seen within a complex fistula extending to the anterior abdominal wall skin surface near the  midline. This is consistent with a complex enterocutaneous fistula. No evidence of bowel obstruction.  Vascular/Lymphatic: No pathologically enlarged lymph nodes identified. No abdominal aortic aneurysm or other significant retroperitoneal abnormality demonstrated.  Reproductive: Prior hysterectomy noted. Adnexal regions are unremarkable in appearance.  Other:  None.  Musculoskeletal:  No suspicious bone lesions identified.  IMPRESSION: Decreased size of complex enterocutaneous fistula within the anterior abdominal wall soft tissues.  Colonic diverticulosis. No radiographic evidence of diverticulitis.   Electronically Signed   By: Earle Gell M.D.   On: 03/20/2015 17:18   Ct Abdomen Pelvis Wo Contrast  03/13/2015   CLINICAL DATA:  Abdominal pain. Decreased appetite. History of abdominal hernia.  EXAM: CT ABDOMEN AND PELVIS WITHOUT CONTRAST  TECHNIQUE: Multidetector CT imaging of the abdomen and pelvis was performed following the standard protocol without IV contrast.  COMPARISON:  CT abdomen pelvis 08/10/2012.  FINDINGS: The heart size is normal. Coronary artery calcifications are present. There is no significant pleural pericardial effusion. The lung bases are clear.  Liver and spleen are within normal limits. The stomach is mildly distended a  small hiatal hernia is noted, similar to the prior scan. The duodenum and pancreas are within normal limits. The common bile duct is normal following cholecystectomy. And upper pole right renal cyst is stable. No other focal renal lesions are present. The ureters are normal. Urinary bladder is unremarkable.  Sigmoid diverticula are present without focal inflammation to suggest diverticulitis. An anastomosis in the distal transverse segment is intact. The more proximal colon is unremarkable. There is contrast in the colon to the level of the transverse colon. The terminal ileum is within normal limits.  A ventral hernia repair is evident. There small bowel adhesions anteriorly. A fistula is noted on image 47 of series 2. There is contrast and air code extending across the peritoneal new. A complex subcutaneous collection contains gas and contrast. The collection measures 9.1 x 6.8 x 10.0 cm. This extends to just below the skin surface. The majority of the collection is just above the umbilicus.  The bone windows demonstrate a vacuum disc at L5-S1. There is fusion of anterior osteophytes the lower thoracic spine. No focal lytic or blastic lesions are present. The pelvis is unremarkable.  IMPRESSION: 1. 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 2. Large subcutaneous collection from the fistula of with probable abscess formation measuring 9.1 x 6.8 x 10.0 cm. But there is fluid and air with contrast extending from the bowel and to the collection. 3. Intact colonic anastomosis. 4. Colonic diverticulosis without diverticulitis. 5. Cholecystectomy, hysterectomy, and oophorectomy. 6. Degenerative changes of the lumbar spine at L5-S1. 7. Atherosclerosis including coronary artery disease. These results were called by telephone at the time of interpretation on 03/13/2015 at 3:14 pm to Dr. Dalia Heading , who verbally acknowledged these results.    Electronically Signed   By: San Morelle M.D.   On: 03/13/2015 15:15   Dg Sinus/fist Tube Chk-non Gi  03/23/2015   CLINICAL DATA:  History of enterocutaneous fistula.  EXAM: ABSCESS INJECTION  CONTRAST:  64mL OMNIPAQUE IOHEXOL 300 MG/ML  SOLN  FLUOROSCOPY TIME:  Radiation Exposure Index (as provided by the fluoroscopic device):  If the device does not provide the exposure index:  Fluoroscopy Time (in minutes and seconds):  43 seconds  Number of Acquired Images:  7  COMPARISON:  03/20/2015  FINDINGS: A narrow gauge catheter was inserted into the cutaneous fistula.  Retention balloon was filled with 5 cc of air. Using gentle pressure water-soluble contrast material was injected through the catheter filling the anterior abdominal wall abscess. With continued injection contrast material spilled from the abscess cavity onto the skin surface. No opacification of bowel loops achieved.  IMPRESSION: 1. Contrast injection into cutaneous fistula opacifies abdominal wall abscess. No opacification of bowel loops identified.   Electronically Signed   By: Kerby Moors M.D.   On: 03/23/2015 14:36   Dg Chest Port 1 View  03/15/2015   CLINICAL DATA:  Central line placement  EXAM: PORTABLE CHEST - 1 VIEW  COMPARISON:  12/27/2014  FINDINGS: Right PICC line tip is in the SVC. NG tube enters the stomach. Heart is borderline in size. No confluent airspace opacities, effusions or edema. No acute bony abnormality.  IMPRESSION: Right PICC line tip in the SVC.  No acute cardiopulmonary disease.   Electronically Signed   By: Rolm Baptise M.D.   On: 03/15/2015 10:35    Microbiology: Recent Results (from the past 240 hour(s))  Clostridium Difficile by PCR (not at [facility])     Status: None   Collection Time: 03/26/15  1:30 PM  Result Value Ref Range Status   C difficile by pcr NEGATIVE NEGATIVE Final     Labs: Basic Metabolic Panel:  Recent Labs Lab 03/22/15 0430 03/23/15 0425 03/25/15 0540 03/26/15 0355  NA 137 133*  136 135  K 4.1 4.0 4.3 4.1  CL 106 103 104 106  CO2 26 25 25 24   GLUCOSE 118* 121* 117* 118*  BUN 32* 35* 36* 41*  CREATININE 1.10* 1.10* 1.14* 1.16*  CALCIUM 8.8* 9.0 9.4 9.2  MG  --  2.0  --  2.1  PHOS  --  3.6  --  3.5   Liver Function Tests:  Recent Labs Lab 03/23/15 0425 03/26/15 0355  AST 35 40  ALT 46 44  ALKPHOS 109 108  BILITOT 0.3 0.4  PROT 7.2 7.2  ALBUMIN 2.8* 2.7*   No results for input(s): LIPASE, AMYLASE in the last 168 hours. No results for input(s): AMMONIA in the last 168 hours. CBC:  Recent Labs Lab 03/23/15 0425 03/27/15 0610  WBC 7.1 5.5  NEUTROABS 5.0  --   HGB 10.2* 8.4*  HCT 34.8* 27.7*  MCV 71.5* 70.7*  PLT 272 213   Cardiac Enzymes: No results for input(s): CKTOTAL, CKMB, CKMBINDEX, TROPONINI in the last 168 hours. BNP: BNP (last 3 results)  Recent Labs  11/02/14 2340 03/19/15 1325  BNP 109.2* 141.6*    ProBNP (last 3 results) No results for input(s): PROBNP in the last 8760 hours.  CBG:  Recent Labs Lab 03/26/15 1214 03/26/15 1637 03/26/15 2003 03/26/15 2213 03/27/15 0633  GLUCAP 94 85 89 82 87    Principal Problem:   Enterocutaneous fistula Active Problems:   Gout   OSA (obstructive sleep apnea)   Essential hypertension   CAD S/P OM BMS 8/07, ISR-OM DES 8/09   GERD   Chronic kidney disease, stage 3   Hyperlipidemia with target LDL less than 100   Chronic diastolic CHF (congestive heart failure)   Diverticulosis of colon with hemorrhage   Anemia, iron deficiency   Abdominal wall abscess   Time coordinating discharge: <30 mins  Signed:  Bahja Bence, ANP-BC

## 2015-03-27 NOTE — Progress Notes (Signed)
[facility] will follow pt and daughter [name] for Home Health needs.

## 2015-03-28 ENCOUNTER — Other Ambulatory Visit: Payer: Self-pay | Admitting: Family

## 2015-03-28 ENCOUNTER — Other Ambulatory Visit: Payer: Self-pay | Admitting: Cardiology

## 2015-03-28 LAB — GLUCOSE, CAPILLARY: Glucose-Capillary: 91 mg/dL (ref 65–99)

## 2015-03-29 ENCOUNTER — Other Ambulatory Visit: Payer: Self-pay | Admitting: Family

## 2015-04-02 ENCOUNTER — Other Ambulatory Visit: Payer: Self-pay | Admitting: Geriatric Medicine

## 2015-04-02 MED ORDER — INDOMETHACIN 50 MG PO CAPS: 50.0000 mg | ORAL_CAPSULE | Freq: Three times a day (TID) | ORAL | Status: DC | PRN

## 2015-04-10 ENCOUNTER — Other Ambulatory Visit: Payer: Self-pay | Admitting: Surgery

## 2015-04-10 DIAGNOSIS — T8149XA Infection following a procedure, other surgical site, initial encounter: Secondary | ICD-10-CM

## 2015-04-14 ENCOUNTER — Ambulatory Visit
Admission: RE | Admit: 2015-04-14 | Discharge: 2015-04-14 | Disposition: A | Discharge status: Home / Self Care | Payer: Medicare Other | Source: Ambulatory Visit | Attending: Surgery | Admitting: Surgery

## 2015-04-14 DIAGNOSIS — T8149XA Infection following a procedure, other surgical site, initial encounter: Secondary | ICD-10-CM

## 2015-04-14 MED ORDER — IOPAMIDOL (ISOVUE-300) INJECTION 61%
125.0000 mL | Freq: Once | INTRAVENOUS | Status: AC | PRN
  Administered 2015-04-14: 125 mL via INTRAVENOUS

## 2015-04-15 ENCOUNTER — Other Ambulatory Visit (INDEPENDENT_AMBULATORY_CARE_PROVIDER_SITE_OTHER): Payer: Medicare Other

## 2015-04-15 ENCOUNTER — Ambulatory Visit (INDEPENDENT_AMBULATORY_CARE_PROVIDER_SITE_OTHER): Payer: Medicare Other | Admitting: Internal Medicine

## 2015-04-15 ENCOUNTER — Encounter: Payer: Self-pay | Admitting: Internal Medicine

## 2015-04-15 VITALS — BP 148/92 | HR 87 | Temp 98.4°F | Resp 16 | Ht 64.0 in | Wt 219.0 lb

## 2015-04-15 DIAGNOSIS — R072 Precordial pain: Secondary | ICD-10-CM

## 2015-04-15 DIAGNOSIS — D509 Iron deficiency anemia, unspecified: Secondary | ICD-10-CM

## 2015-04-15 DIAGNOSIS — L02211 Cutaneous abscess of abdominal wall: Secondary | ICD-10-CM

## 2015-04-15 DIAGNOSIS — N183 Chronic kidney disease, stage 3 unspecified: Secondary | ICD-10-CM

## 2015-04-15 DIAGNOSIS — R791 Abnormal coagulation profile: Secondary | ICD-10-CM

## 2015-04-15 DIAGNOSIS — R7989 Other specified abnormal findings of blood chemistry: Secondary | ICD-10-CM

## 2015-04-15 LAB — BASIC METABOLIC PANEL
BUN: 42 mg/dL — ABNORMAL HIGH (ref 6–23)
CO2: 25 mEq/L (ref 19–32)
Calcium: 9.7 mg/dL (ref 8.4–10.5)
Chloride: 104 mEq/L (ref 96–112)
Creatinine, Ser: 1.27 mg/dL — ABNORMAL HIGH (ref 0.40–1.20)
GFR: 52.84 mL/min — ABNORMAL LOW (ref >60.00)
Glucose, Bld: 90 mg/dL (ref 70–99)
POTASSIUM: 4.4 meq/L (ref 3.5–5.1)
Sodium: 138 mEq/L (ref 135–145)

## 2015-04-15 LAB — IBC PANEL
IRON: 57 ug/dL (ref 42–145)
Saturation Ratios: 22 % (ref 20.0–50.0)
TRANSFERRIN: 185 mg/dL — AB (ref 212.0–360.0)

## 2015-04-15 LAB — CBC WITH DIFFERENTIAL/PLATELET
BASOS ABS: 0 10*3/uL (ref 0.0–0.1)
BASOS PCT: 0.3 % (ref 0.0–3.0)
EOS ABS: 0.1 10*3/uL (ref 0.0–0.7)
Eosinophils Relative: 1.9 % (ref 0.0–5.0)
HEMATOCRIT: 28.7 % — AB (ref 36.0–46.0)
Hemoglobin: 9.3 g/dL — ABNORMAL LOW (ref 12.0–15.0)
LYMPHS ABS: 0.9 10*3/uL (ref 0.7–4.0)
Lymphocytes Relative: 13.9 % (ref 12.0–46.0)
MCHC: 32.2 g/dL (ref 30.0–36.0)
MCV: 69.6 fl — ABNORMAL LOW (ref 78.0–100.0)
MONOS PCT: 7.7 % (ref 3.0–12.0)
Monocytes Absolute: 0.5 10*3/uL (ref 0.1–1.0)
Neutro Abs: 5 10*3/uL (ref 1.4–7.7)
Neutrophils Relative %: 76.2 % (ref 43.0–77.0)
PLATELETS: 202 10*3/uL (ref 150.0–400.0)
RBC: 4.13 Mil/uL (ref 3.87–5.11)
RDW: 18 % — ABNORMAL HIGH (ref 11.5–15.5)
WBC: 6.6 10*3/uL (ref 4.0–10.5)

## 2015-04-15 LAB — CARDIAC PANEL
CK-MB: 1 ng/mL (ref 0.3–4.0)
RELATIVE INDEX: 2.8 calc — AB (ref 0.0–2.5)
Total CK: 36 U/L (ref 7–177)

## 2015-04-15 LAB — FERRITIN: Ferritin: 753.3 ng/mL — ABNORMAL HIGH (ref 10.0–291.0)

## 2015-04-15 LAB — TROPONIN I: TNIDX: 0 ug/l (ref 0.00–0.06)

## 2015-04-15 MED ORDER — FERIVA 21/7 75-1 MG PO TABS: 1.0000 | ORAL_TABLET | Freq: Every day | ORAL | Status: DC

## 2015-04-15 MED ORDER — HYDROCODONE-ACETAMINOPHEN 5-325 MG PO TABS: 1.0000 | ORAL_TABLET | Freq: Four times a day (QID) | ORAL | Status: DC | PRN

## 2015-04-15 NOTE — Progress Notes (Signed)
Subjective:  Patient ID: Marissa [name], female    DOB: [birth year]  Age: 74 y.o. MRN: [id number]  CC: Anemia and Chest Pain   HPI Marissa [name] presents for follow-up after recent admission for an abdominal wall abscess with fistula. She had a CT scan done of her abdomen one day prior to this visit which showed that there was a decreased size in the abscess. She is still on total parenteral nutrition. She complains that there is some diffuse discomfort in her abdomen and wants something for pain. She also complains for the last few weeks she has had a dull ache over her lower sternum and some shortness of breath that occurs whenever she lays down flat. The last time that she had this discomfort was yesterday when she laid down flat for the CT scan. She does not report any dyspnea on exertion, cough, or hemoptysis.  Outpatient Prescriptions Prior to Visit  Medication Sig Dispense Refill  . amLODipine (NORVASC) 10 MG tablet Take 1 tablet (10 mg total) by mouth daily. 30 tablet 11  . colchicine 0.6 MG tablet TAKE ONE TABLET BY MOUTH DAILY AS NEEDED 30 tablet 0  . cyanocobalamin 2000 MCG tablet Take 1 tablet (2,000 mcg total) by mouth daily. 90 tablet 3  . furosemide (LASIX) 20 MG tablet Take 20 mg by mouth daily as needed for fluid.     . metoprolol tartrate (LOPRESSOR) 25 MG tablet Take 1 tablet (25 mg total) by mouth every morning. 30 tablet 1  . NITROSTAT 0.4 MG SL tablet DISSOLVE ONE TABLET UNDER THE TONGUE EVERY 5 MINUTES AS NEEDED FOR CHEST PAIN.  DO NOT EXCEED A TOTAL OF 3 DOSES IN 15 MINUTES 100 tablet 0  . pantoprazole (PROTONIX) 40 MG tablet Take 1 tablet (40 mg total) by mouth daily. 30 tablet 2  . VOLTAREN 1 % GEL Apply 2 g topically 4 (four) times daily as needed (pain).     . indomethacin (INDOCIN) 50 MG capsule Take 1 capsule (50 mg total) by mouth 3 (three) times daily as needed. 90 capsule 3  . sodium chloride 0.9 % injection 10-40 mLs by Intracatheter route every 12 (twelve)  hours. 5 mL 5  . sodium chloride 0.9 % injection 10-40 mLs by Intracatheter route as needed (flush). 5 mL 5  . atorvastatin (LIPITOR) 40 MG tablet Take 1 tablet (40 mg total) by mouth daily. (Patient not taking: Reported on 03/13/2015) 30 tablet 0  . colchicine 0.6 MG tablet Take 1 tablet (0.6 mg total) by mouth 2 (two) times daily. 30 tablet 0  . furosemide (LASIX) 20 MG tablet TAKE ONE TABLET BY MOUTH DAILY 30 tablet 0   No facility-administered medications prior to visit.    ROS Review of Systems  Constitutional: Positive for fatigue. Negative for fever, chills, diaphoresis, activity change, appetite change and unexpected weight change.  HENT: Negative.  Negative for trouble swallowing and voice change.   Eyes: Negative.   Respiratory: Positive for shortness of breath. Negative for cough, choking, chest tightness, wheezing and stridor.   Cardiovascular: Positive for chest pain. Negative for palpitations and leg swelling.  Gastrointestinal: Positive for abdominal pain. Negative for nausea, vomiting, diarrhea, constipation, blood in stool, abdominal distention, anal bleeding and rectal pain.  Endocrine: Negative.   Genitourinary: Negative.   Musculoskeletal: Negative.   Skin: Negative.   Allergic/Immunologic: Negative.   Neurological: Negative.  Negative for dizziness, speech difficulty, weakness and light-headedness.  Hematological: Negative.   Psychiatric/Behavioral: Negative.  Objective:  BP 148/92 mmHg  Pulse 87  Temp(Src) 98.4 F (36.9 C) (Oral)  Ht 5\' 4"  (1.626 m)  Wt 219 lb (99.338 kg)  BMI 37.57 kg/m2  SpO2 99%  BP Readings from Last 3 Encounters:  04/15/15 148/92  03/27/15 132/78  03/05/15 122/70    Wt Readings from Last 3 Encounters:  04/15/15 219 lb (99.338 kg)  03/14/15 222 lb 10.6 oz (101 kg)  03/05/15 219 lb (99.338 kg)    Physical Exam  Constitutional: She is oriented to person, place, and time.  Non-toxic appearance. She does not have a sickly  appearance. She does not appear ill. No distress.  HENT:  Mouth/Throat: Oropharynx is clear and moist. Mucous membranes are pale, not dry and not cyanotic. No oropharyngeal exudate.  Eyes: Conjunctivae are normal. Right eye exhibits no discharge. Left eye exhibits no discharge. No scleral icterus.  Neck: Normal range of motion. Neck supple. No JVD present. No tracheal deviation present. No thyromegaly present.  Cardiovascular: Normal rate, regular rhythm, normal heart sounds and intact distal pulses.  Exam reveals no gallop and no friction rub.   No murmur heard. EKG -  A normal sinus rhythm. No Q waves. No ST-T wave changes. Normal.  Pulmonary/Chest: Effort normal and breath sounds normal. No accessory muscle usage or stridor. No respiratory distress. She has no decreased breath sounds. She has no wheezes. She has no rhonchi. She has no rales. She exhibits no mass, no tenderness, no bony tenderness, no crepitus, no edema and no deformity.  Abdominal: Soft. Normal appearance and bowel sounds are normal. She exhibits no shifting dullness, no distension, no pulsatile liver, no fluid wave, no abdominal bruit, no ascites, no pulsatile midline mass and no mass. There is no hepatosplenomegaly, splenomegaly or hepatomegaly. There is no tenderness. There is no rigidity, no rebound, no guarding, no CVA tenderness, no tenderness at McBurney's point and negative Murphy's sign. No hernia. Hernia confirmed negative in the ventral area, confirmed negative in the right inguinal area and confirmed negative in the left inguinal area.    Musculoskeletal: Normal range of motion. She exhibits no edema or tenderness.  Lymphadenopathy:    She has no cervical adenopathy.  Neurological: She is oriented to person, place, and time.  Skin: Skin is warm and dry. No rash noted. She is not diaphoretic. No erythema. No pallor.  Psychiatric: She has a normal mood and affect. Her behavior is normal. Judgment and thought content  normal.    Lab Results  Component Value Date   WBC 6.6 04/15/2015   HGB 9.3* 04/15/2015   HCT 28.7* 04/15/2015   PLT 202.0 04/15/2015   GLUCOSE 90 04/15/2015   CHOL 209* 05/29/2014   TRIG 135 03/23/2015   HDL 51.30 05/29/2014   LDLCALC 135* 05/29/2014   ALT 44 03/26/2015   AST 40 03/26/2015   NA 138 04/15/2015   K 4.4 04/15/2015   CL 104 04/15/2015   CREATININE 1.27* 04/15/2015   BUN 42* 04/15/2015   CO2 25 04/15/2015   TSH 2.400 12/21/2014   INR 1.08 12/26/2014   HGBA1C 5.7* 03/14/2015   MICROALBUR 0.50 04/03/2009    Ct Abdomen Pelvis W Contrast  04/14/2015   CLINICAL DATA:  Abdominal wall abscess in the umbilical region for 1 month. History of umbilical hernia repair. Partial left colectomy, tubal ligation, hysterectomy.  EXAM: CT ABDOMEN AND PELVIS WITH CONTRAST  TECHNIQUE: Multidetector CT imaging of the abdomen and pelvis was performed using the standard protocol following bolus administration  of intravenous contrast.  CONTRAST:  155mL ISOVUE-300 IOPAMIDOL (ISOVUE-300) INJECTION 61%  COMPARISON:  03/20/2015 and 03/13/2015 similar exam, fistula injection 03/23/2015  FINDINGS: Lower chest:  Lung bases are clear.  Hepatobiliary: Cholecystectomy clips are noted. Liver is unremarkable.  Pancreas: Normal  Spleen: Normal  Adrenals/Urinary Tract: Adrenal glands are normal. 1.3 cm right upper renal pole cyst. No radiopaque renal, ureteral, or bladder calculus.  Stomach/Bowel: Small hiatal hernia reidentified. Evidence of periumbilical enterocutaneous fistula a reidentified involving adjacent loops of small bowel and containing contrast/ gas level. Overall, this collection measures slightly smaller when the internal cavity component is measured using similar technique as on the prior exam, currently 3.9 x 2.0 cm image 49, previously 5.6 x 2.3 cm at a similar anatomic level.  Colonic diverticuli noted without evidence for diverticulitis.  Vascular/Lymphatic: A small amount of stranding/haziness  within the root of small bowel mesentery is stable. Moderate atheromatous aortic calcification without aneurysm. No lymphadenopathy.  Other: No free air  Musculoskeletal: No acute osseous abnormality. Lumbar spine disc degenerative change.  IMPRESSION: Slight decrease in size of cavitary component of periumbilical enterocutaneous fistula.   Electronically Signed   By: Conchita Paris M.D.   On: 04/14/2015 10:47    Assessment & Plan:   Fionna was seen today for anemia and chest pain.  Diagnoses and all orders for this visit:  Chronic kidney disease, stage 3- her renal function has declined slightly, will stop the anti-inflammatories. She agrees to avoid nephrotoxic agents. Orders: -     Basic metabolic panel; Future  Anemia, iron deficiency- her I and level remains low. We'll restart iron replacement therapy. Ferritin level is elevated consistent with an acute infectious/inflammatory process. Orders: -     CBC with Differential/Platelet; Future -     IBC panel; Future -     Ferritin; Future -     FeAsp-B12-FA-C-DSS-SuccAc-Zn (FERIVA 21/7) 75-1 MG TABS; Take 1 tablet by mouth daily.  Precordial chest pain- her chest pain is atypical. Her exam and EKG are normal. Her cardiac enzymes are negative. Her d-dimer is slightly elevated. Will order a CT angiogram to rule out pulmonary embolus. Orders: -     EKG 12-Lead -     Troponin I; Future -     Cardiac panel; Future -     D-dimer, quantitative (not at [facility]); Future -     CT Angio Chest PE W/Cm &/Or Wo Cm; Future  Abdominal wall abscess- we'll offer Norco for pain relief, she will continue to follow closely with her general surgeons. Orders: -     HYDROcodone-acetaminophen (NORCO/VICODIN) 5-325 MG per tablet; Take 1 tablet by mouth every 6 (six) hours as needed for moderate pain.  D-dimer, elevated- she is high risk for pulmonary embolus so I have ordered a CT angio. Orders: -     CT Angio Chest PE W/Cm &/Or Wo Cm; Future  I have  discontinued Ms. Callaham's atorvastatin, sodium chloride, sodium chloride, and indomethacin. I am also having her start on HYDROcodone-acetaminophen and FERIVA 21/7. Additionally, I am having her maintain her amLODipine, furosemide, metoprolol tartrate, pantoprazole, cyanocobalamin, VOLTAREN, colchicine, NITROSTAT, and BENICAR.  Meds ordered this encounter  Medications  . BENICAR 20 MG tablet    Sig:   . HYDROcodone-acetaminophen (NORCO/VICODIN) 5-325 MG per tablet    Sig: Take 1 tablet by mouth every 6 (six) hours as needed for moderate pain.    Dispense:  75 tablet    Refill:  0  . FeAsp-B12-FA-C-DSS-SuccAc-Zn (FERIVA 21/7) 75-1  MG TABS    Sig: Take 1 tablet by mouth daily.    Dispense:  28 tablet    Refill:  11     Follow-up: Return in about 2 months (around 06/15/2015).  Marissa Calico, MD

## 2015-04-15 NOTE — Patient Instructions (Signed)
Iron Deficiency Anemia Anemia is a condition in which there are less red blood cells or hemoglobin in the blood than normal. Hemoglobin is the part of red blood cells that carries oxygen. Iron deficiency anemia is anemia caused by too little iron. It is the most common type of anemia. It may leave you tired and short of breath. CAUSES   Lack of iron in the diet.  Poor absorption of iron, as seen with intestinal disorders.  Intestinal bleeding.  Heavy periods. SIGNS AND SYMPTOMS  Mild anemia may not be noticeable. Symptoms may include:  Fatigue.  Headache.  Pale skin.  Weakness.  Tiredness.  Shortness of breath.  Dizziness.  Cold hands and feet.  Fast or irregular heartbeat. DIAGNOSIS  Diagnosis requires a thorough evaluation and physical exam by your health care provider. Blood tests are generally used to confirm iron deficiency anemia. Additional tests may be done to find the underlying cause of your anemia. These may include:  Testing for blood in the stool (fecal occult blood test).  A procedure to see inside the colon and rectum (colonoscopy).  A procedure to see inside the esophagus and stomach (endoscopy). TREATMENT  Iron deficiency anemia is treated by correcting the cause of the deficiency. Treatment may involve:  Adding iron-rich foods to your diet.  Taking iron supplements. Pregnant or breastfeeding women need to take extra iron because their normal diet usually does not provide the required amount.  Taking vitamins. Vitamin C improves the absorption of iron. Your health care provider may recommend that you take your iron tablets with a glass of orange juice or vitamin C supplement.  Medicines to make heavy menstrual flow lighter.  Surgery. HOME CARE INSTRUCTIONS   Take iron as directed by your health care provider.  If you cannot tolerate taking iron supplements by mouth, talk to your health care provider about taking them through a vein  (intravenously) or an injection into a muscle.  For the best iron absorption, iron supplements should be taken on an empty stomach. If you cannot tolerate them on an empty stomach, you may need to take them with food.  Do not drink milk or take antacids at the same time as your iron supplements. Milk and antacids may interfere with the absorption of iron.  Iron supplements can cause constipation. Make sure to include fiber in your diet to prevent constipation. A stool softener may also be recommended.  Take vitamins as directed by your health care provider.  Eat a diet rich in iron. Foods high in iron include liver, lean beef, whole-grain bread, eggs, dried fruit, and dark green leafy vegetables. SEEK IMMEDIATE MEDICAL CARE IF:   You faint. If this happens, do not drive. Call your local emergency services (911 in [place] if no other help is available.  You have chest pain.  You feel nauseous or vomit.  You have severe or increased shortness of breath with activity.  You feel weak.  You have a rapid heartbeat.  You have unexplained sweating.  You become light-headed when getting up from a chair or bed. MAKE SURE YOU:   Understand these instructions.  Will watch your condition.  Will get help right away if you are not doing well or get worse. Document Released: 08/26/2000 Document Revised: 09/03/2013 Document Reviewed: 05/06/2013 [employer] Patient Information 2015 [employer]. This information is not intended to replace advice given to you by your health care provider. Make sure you discuss any questions you have with your health care provider.  

## 2015-04-15 NOTE — Progress Notes (Signed)
Pre visit review using our clinic review tool, if applicable. No additional management support is needed unless otherwise documented below in the visit note. 

## 2015-04-16 DIAGNOSIS — R7989 Other specified abnormal findings of blood chemistry: Secondary | ICD-10-CM | POA: Insufficient documentation

## 2015-04-16 LAB — D-DIMER, QUANTITATIVE (NOT AT ARMC): D-Dimer, Quant: 4.32 ug/mL-FEU — ABNORMAL HIGH (ref 0.00–0.48)

## 2015-04-17 ENCOUNTER — Ambulatory Visit: Payer: Self-pay | Admitting: Surgery

## 2015-04-17 NOTE — H&P (Signed)
History of Present Illness Marissa [name]. Marissa Broski MD; 04/17/2015 3:10 PM) Patient words: reck.  The patient is a 74 year old female presenting to discuss diagnostic procedure results. The patient is a 74 year old female who presents with a subcutaneous abscess and a very complex past surgical history. In 2003 she underwent a left hemicolectomy with left transverse colon to sigmoid colon anastomosis by Dr. Alphonsa Overall for left colon diverticulitis. He also repaired her umbilical hernia and removed her appendix. In 2009 she underwent repair of incarcerated ventral incisional hernia by Dr. Johnathan Hausen. This was repaired with Ventralex mesh secured with Prolene sutures. In 2013 she underwent an attempted laparoscopic hysterectomy converted to an open total abdominal hysterectomy with bilateral salpingo-oophorectomy. This required extensive enterolysis. The surgeon was Dr. Janie Morning. Several days after that surgery she returned to the hospital with foul-smelling fluid coming from her wound. 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 We managed this conservatively with a VAC dressing. The wound subsequently healed. She was last seen in our office in January 2014 with a completely healed wound. She has had several admissions for GI bleeding. In late June of this year she noticed some swelling over the middle of her midline scar. 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 She was managed with a Eakin's pouch, bowel rest, TPN, and Invanz. Fistula studies were unable to locate the location of her fistula. The fistula output has decreased. She was discharged after 15 days in the hospital. She is in my office for follow-up since I was the last person that operated on her. I did not see her at all while she was in the hospital. Her wound has closed down to 2 very small openings with some purulent drainage.  She is cycling on her TNA. She is tolerating some oral diet. She is having regular bowel movements.  At the last visit, it appeared that she had some purulent fluid coming from her small midline wound, but no sign of succus entericus or stool. The CT scan, however, shows evidence of an enterocutaneous fistula. Therefore, we have kept her on her cyclic TNA. The wound continues to just drain purulent fluid. I asked them to come in today to review the CT scan.   CLINICAL DATA: Abdominal wall abscess in the umbilical region for 1 month. History of umbilical hernia repair. Partial left colectomy, tubal ligation, hysterectomy.  EXAM: CT ABDOMEN AND PELVIS WITH CONTRAST  TECHNIQUE: Multidetector CT imaging of the abdomen and pelvis was performed using the standard protocol following bolus administration of intravenous contrast.  CONTRAST: 150mL ISOVUE-300 IOPAMIDOL (ISOVUE-300) INJECTION 61%  COMPARISON: 03/20/2015 and 03/13/2015 similar exam, fistula injection 03/23/2015  FINDINGS: Lower chest: Lung bases are clear.  Hepatobiliary: Cholecystectomy clips are noted. Liver is unremarkable.  Pancreas: Normal  Spleen: Normal  Adrenals/Urinary Tract: Adrenal glands are normal. 1.3 cm right upper renal pole cyst. No radiopaque renal, ureteral, or bladder calculus.  Stomach/Bowel: Small hiatal hernia reidentified. Evidence of periumbilical enterocutaneous fistula a reidentified involving adjacent loops of small bowel and containing contrast/ gas level. Overall, this collection measures slightly smaller when the internal cavity component is measured using similar technique as on the prior exam, currently 3.9 x 2.0 cm image 49, previously 5.6 x 2.3 cm at a similar anatomic level.  Colonic diverticuli noted without evidence for diverticulitis.  Vascular/Lymphatic: A small amount of stranding/haziness within the root of small bowel mesentery is stable.  Moderate  atheromatous aortic calcification without aneurysm. No lymphadenopathy.  Other: No free air  Musculoskeletal: No acute osseous abnormality. Lumbar spine disc degenerative change.  IMPRESSION: Slight decrease in size of cavitary component of periumbilical enterocutaneous fistula.   Electronically Signed By: Conchita Paris M.D. 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 Have your home health nurse place an Eakin's pouch, cut to the appropriate size to cover both fistula sites.  Other Problems Marissa Petties, MD; 04/17/2015 3:10 PM) Cholelithiasis Congestive Heart Failure Diverticulosis Back Pain Gastroesophageal Reflux Disease Transfusion history Sleep Apnea High blood pressure Hypercholesterolemia Myocardial infarction  Diagnostic Studies History Marissa Petties, MD; 04/17/2015 3:10 PM) Colonoscopy within last year Mammogram 1-3 years ago  Allergies Marjean Donna, [place]; 04/17/2015 11:18 AM) No Known Drug Allergies 04/06/2015  Medication History (Sonya Bynum, CMA; 04/17/2015 11:18 AM) AmLODIPine Besylate (10MG  Tablet, Oral) Active. Atorvastatin Calcium (40MG  Tablet, Oral) Active. Atorvastatin Calcium (20MG  Tablet, Oral) Active. Furosemide (20MG  Tablet, Oral) Active. Nitrostat (0.4MG  Tab Sublingual, Sublingual) Active. Metoprolol Tartrate (25MG  Tablet, Oral) Active. Voltaren (1% Gel, Transdermal) Active. Pantoprazole Sodium (40MG  Tablet DR, Oral) Active. Colchicine (0.6MG  Tablet, Oral) Active. Benicar (20MG  Tablet, Oral) Active. Medications Reconciled  Social History Marissa Petties, MD; 04/17/2015 3:10 PM) Tobacco use Never smoker. Caffeine use Carbonated beverages, Tea. No alcohol use No drug use  Family History Marissa Petties, MD; 04/17/2015 3:10 PM) Diabetes Mellitus Sister. Heart  Disease Mother. Hypertension Mother. Arthritis Mother. Anesthetic complications Mother.  Pregnancy / Birth History Marissa Petties, MD; 04/17/2015 3:10 PM) Irregular periods Maternal age 91-20 Para 76 Age at menarche 79 years. Age of menopause 39-55 Gravida 6    Vitals (Center; 04/17/2015 11:18 AM) 04/17/2015 11:17 AM Weight: 216 lb Height: 64in Body Surface Area: 2.1 m Body Mass Index: 37.08 kg/m Temp.: 68F(Temporal)  Pulse: 81 (Regular)  BP: 130/74 (Sitting, Left Arm, Standard)     Physical Exam Rodman Key K. Diamond Martucci MD; 04/17/2015 3:09 PM)  The physical exam findings are as follows: Note:Elderly female in NAD HEENT: EOMI, sclera anicteric Neck: No masses, no thyromegaly Lungs: CTA bilaterally; normal respiratory effort CV: Regular rate and rhythm; no murmurs Abd: +bowel sounds, soft,non-tender; healing midline wound with two small opening. Purulent, but non-feculent drainage from each hole. The openings are probed with cotton swabs and they seem to connect. Ext: Well-perfused; no edema Skin: Warm, dry; no sign of jaundice    Assessment & Plan Rodman Key K. 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 If we see any infected mesh we will try to excise locally. I do not think that she would tolerate or benefit from any type of PEG exploratory laparotomy but she should be able to tolerate a smaller procedure to manage her wound. Would recommend doing this as an inpatient as she will likely need several consultants to help comanage her comorbidities.  Instructions: Will arrange direct admission to [facility] on 04/27/15 for medical management and surgical debridement of abdominal wound.  Marissa [name]. Georgette Dover, MD, [facility] Surgery  General/ Trauma  Surgery  04/17/2015 3:11 PM

## 2015-04-20 ENCOUNTER — Telehealth: Payer: Self-pay | Admitting: Internal Medicine

## 2015-04-20 ENCOUNTER — Emergency Department (HOSPITAL_COMMUNITY)
Admission: EM | Admit: 2015-04-20 | Discharge: 2015-04-20 | Disposition: A | Discharge status: Home / Self Care | Payer: Medicare Other | Attending: Emergency Medicine | Admitting: Emergency Medicine

## 2015-04-20 ENCOUNTER — Other Ambulatory Visit: Payer: Self-pay | Admitting: Internal Medicine

## 2015-04-20 ENCOUNTER — Encounter (HOSPITAL_COMMUNITY): Payer: Self-pay | Admitting: Neurology

## 2015-04-20 ENCOUNTER — Other Ambulatory Visit: Payer: Self-pay

## 2015-04-20 ENCOUNTER — Emergency Department (HOSPITAL_COMMUNITY): Payer: Medicare Other

## 2015-04-20 ENCOUNTER — Ambulatory Visit (HOSPITAL_COMMUNITY)
Admission: RE | Admit: 2015-04-20 | Discharge: 2015-04-20 | Disposition: A | Discharge status: Home / Self Care | Payer: Medicare Other | Source: Ambulatory Visit | Attending: Internal Medicine | Admitting: Internal Medicine

## 2015-04-20 DIAGNOSIS — Z8742 Personal history of other diseases of the female genital tract: Secondary | ICD-10-CM | POA: Insufficient documentation

## 2015-04-20 DIAGNOSIS — I700 Atherosclerosis of aorta: Secondary | ICD-10-CM

## 2015-04-20 DIAGNOSIS — Z86018 Personal history of other benign neoplasm: Secondary | ICD-10-CM | POA: Insufficient documentation

## 2015-04-20 DIAGNOSIS — I2510 Atherosclerotic heart disease of native coronary artery without angina pectoris: Secondary | ICD-10-CM | POA: Insufficient documentation

## 2015-04-20 DIAGNOSIS — Z86010 Personal history of colonic polyps: Secondary | ICD-10-CM | POA: Insufficient documentation

## 2015-04-20 DIAGNOSIS — R072 Precordial pain: Secondary | ICD-10-CM

## 2015-04-20 DIAGNOSIS — R0789 Other chest pain: Secondary | ICD-10-CM | POA: Insufficient documentation

## 2015-04-20 DIAGNOSIS — R071 Chest pain on breathing: Secondary | ICD-10-CM

## 2015-04-20 DIAGNOSIS — Z86718 Personal history of other venous thrombosis and embolism: Secondary | ICD-10-CM | POA: Insufficient documentation

## 2015-04-20 DIAGNOSIS — M109 Gout, unspecified: Secondary | ICD-10-CM | POA: Diagnosis not present

## 2015-04-20 DIAGNOSIS — K753 Granulomatous hepatitis, not elsewhere classified: Secondary | ICD-10-CM | POA: Insufficient documentation

## 2015-04-20 DIAGNOSIS — Z9049 Acquired absence of other specified parts of digestive tract: Secondary | ICD-10-CM | POA: Insufficient documentation

## 2015-04-20 DIAGNOSIS — K219 Gastro-esophageal reflux disease without esophagitis: Secondary | ICD-10-CM | POA: Diagnosis not present

## 2015-04-20 DIAGNOSIS — R079 Chest pain, unspecified: Secondary | ICD-10-CM

## 2015-04-20 DIAGNOSIS — D509 Iron deficiency anemia, unspecified: Secondary | ICD-10-CM | POA: Insufficient documentation

## 2015-04-20 DIAGNOSIS — E119 Type 2 diabetes mellitus without complications: Secondary | ICD-10-CM | POA: Diagnosis not present

## 2015-04-20 DIAGNOSIS — Z872 Personal history of diseases of the skin and subcutaneous tissue: Secondary | ICD-10-CM | POA: Diagnosis not present

## 2015-04-20 DIAGNOSIS — R0602 Shortness of breath: Secondary | ICD-10-CM | POA: Diagnosis not present

## 2015-04-20 DIAGNOSIS — M159 Polyosteoarthritis, unspecified: Secondary | ICD-10-CM | POA: Insufficient documentation

## 2015-04-20 DIAGNOSIS — E669 Obesity, unspecified: Secondary | ICD-10-CM | POA: Diagnosis not present

## 2015-04-20 DIAGNOSIS — I5032 Chronic diastolic (congestive) heart failure: Secondary | ICD-10-CM | POA: Diagnosis not present

## 2015-04-20 DIAGNOSIS — Z8669 Personal history of other diseases of the nervous system and sense organs: Secondary | ICD-10-CM | POA: Diagnosis not present

## 2015-04-20 DIAGNOSIS — R0600 Dyspnea, unspecified: Secondary | ICD-10-CM | POA: Diagnosis not present

## 2015-04-20 DIAGNOSIS — R791 Abnormal coagulation profile: Secondary | ICD-10-CM | POA: Insufficient documentation

## 2015-04-20 DIAGNOSIS — R7989 Other specified abnormal findings of blood chemistry: Secondary | ICD-10-CM

## 2015-04-20 DIAGNOSIS — Z79899 Other long term (current) drug therapy: Secondary | ICD-10-CM | POA: Diagnosis not present

## 2015-04-20 LAB — CBC
HCT: 31.6 % — ABNORMAL LOW (ref 36.0–46.0)
HEMOGLOBIN: 9.7 g/dL — AB (ref 12.0–15.0)
MCH: 22 pg — AB (ref 26.0–34.0)
MCHC: 30.7 g/dL (ref 30.0–36.0)
MCV: 71.7 fL — ABNORMAL LOW (ref 78.0–100.0)
Platelets: 223 10*3/uL (ref 150–400)
RBC: 4.41 MIL/uL (ref 3.87–5.11)
RDW: 16.7 % — ABNORMAL HIGH (ref 11.5–15.5)
WBC: 7.6 10*3/uL (ref 4.0–10.5)

## 2015-04-20 LAB — BASIC METABOLIC PANEL
Anion gap: 13 (ref 5–15)
BUN: 32 mg/dL — AB (ref 6–20)
CALCIUM: 9.4 mg/dL (ref 8.9–10.3)
CHLORIDE: 105 mmol/L (ref 101–111)
CO2: 19 mmol/L — ABNORMAL LOW (ref 22–32)
Creatinine, Ser: 1.29 mg/dL — ABNORMAL HIGH (ref 0.44–1.00)
GFR calc non Af Amer: 40 mL/min — ABNORMAL LOW (ref >60)
GFR, EST AFRICAN AMERICAN: 46 mL/min — AB (ref >60)
GLUCOSE: 73 mg/dL (ref 65–99)
POTASSIUM: 4.3 mmol/L (ref 3.5–5.1)
Sodium: 137 mmol/L (ref 135–145)

## 2015-04-20 LAB — I-STAT CHEM 8, ED
BUN: 35 mg/dL — ABNORMAL HIGH (ref 6–20)
CALCIUM ION: 1.23 mmol/L (ref 1.13–1.30)
CREATININE: 1.2 mg/dL — AB (ref 0.44–1.00)
Chloride: 103 mmol/L (ref 101–111)
GLUCOSE: 75 mg/dL (ref 65–99)
HEMATOCRIT: 34 % — AB (ref 36.0–46.0)
Hemoglobin: 11.6 g/dL — ABNORMAL LOW (ref 12.0–15.0)
POTASSIUM: 4.2 mmol/L (ref 3.5–5.1)
SODIUM: 139 mmol/L (ref 135–145)
TCO2: 22 mmol/L (ref 0–100)

## 2015-04-20 LAB — I-STAT TROPONIN, ED: TROPONIN I, POC: 0 ng/mL (ref 0.00–0.08)

## 2015-04-20 MED ORDER — HEPARIN SOD (PORK) LOCK FLUSH 100 UNIT/ML IV SOLN
INTRAVENOUS | Status: DC
Start: 2015-04-20 — End: 2015-04-21
  Filled 2015-04-20: qty 5

## 2015-04-20 MED ORDER — IOHEXOL 350 MG/ML SOLN
80.0000 mL | Freq: Once | INTRAVENOUS | Status: AC | PRN
  Administered 2015-04-20: 75 mL via INTRAVENOUS

## 2015-04-20 MED ORDER — HEPARIN SOD (PORK) LOCK FLUSH 100 UNIT/ML IV SOLN
INTRAVENOUS | Status: AC
  Filled 2015-04-20: qty 5

## 2015-04-20 NOTE — Telephone Encounter (Signed)
Spoke with pt daughter. She wanted to know why procedures were being repeated. I explained it was a CT that her mother received and not an xray. Dr. Ronnald Ramp ordered a CT of chest to look for a clot and another provider had ordered a CT for a different reason. Also the other CT was for a scan of the abdomen.

## 2015-04-20 NOTE — Telephone Encounter (Signed)
Received a call from radiology regarding CT chest results.  Study limited by respiratory motion - no central, lobar or proximal segmental sized pulmonary embolism.  Small, distal emboli cannot be entirely excluded.  Atherosclerosis (three vessel) present.  Discussed results with pt and her daughter.  They report she has been having chest pain and sob.  I explained the results and explained - still don't have a definite answer for her symptoms.  If actively having sob and chest pain,, then needs further evaluation.  They are at the hospital and will go to ER for evaluation.

## 2015-04-20 NOTE — ED Notes (Signed)
Pt c/o intermittent centralized chest pressure x several days. Pt had a CT of the chest today and was sent to the ED for further evaluation for chest pain. Pt reports "I was sent here for a blood test for my heart." Pt denies chest pain at this time.

## 2015-04-20 NOTE — Telephone Encounter (Signed)
Patient was sent to cardiology and none of the lab techs wasn't certified to draw blood from a pick line and she says we knew that and we sent her there anyway and wants to know why we would send her mother there in her condition. I believe she said they sent her to the hospital and when she asked about the xrays they weren't able to see the images.  Please call daughter at [phone]

## 2015-04-20 NOTE — ED Notes (Signed)
Pt reports cp for several days is intermittent and is on the left side. Today was here for CT scan of her chest and told doctor she has been having cp and sent here for evaluation. Pt has bandage to her abdomen with fistula. Pt is a x 4

## 2015-04-20 NOTE — ED Provider Notes (Signed)
CSN: [id number]     Arrival date & time 04/20/15  1750 History   First MD Initiated Contact with Patient 04/20/15 2150     Chief Complaint  Patient presents with  . Chest Pain     (Consider location/radiation/quality/duration/timing/severity/associated sxs/prior Treatment) HPI   Marissa [name] is a 74 y.o. female who presents for evaluation of chest pain and shortness of breath. Patient is vague about how long these symptoms have been present. Her PCP sent her to the radiology department today for CT  of the chest to evaluate for PE. This was ordered on 04/15/2015, after her d-dimer was noted to be elevated. The test was done, and did not reveal PE. Her physician was contacted after the test to discuss the results. That physician was concerned because the patient was having ongoing chest pain and shortness of breath, so suggested that she come to the ED for evaluation. She is being treated at home, and recovering for a enterocutaneous fistula. She had an abdominal CT done about a week ago, which showed decreasing size of her enterocutaneous fistula cavity. The patient reports that she's been able to eat and drink. She is receiving TPN. There has been no fever, cough, weakness or dizziness. There are no other known modifying factors.   Past Medical History  Diagnosis Date  . Hypertension   . Renal insufficiency     baseline Cr ~ 1.3  . Hyperlipidemia   . History of DVT (deep vein thrombosis)   . History of colonic polyps   . Gout   . Polyarthritis   . Microcytic anemia   . OSA (obstructive sleep apnea)   . GERD (gastroesophageal reflux disease)   . Esophagitis   . Hidradenitis suppurativa     s/p axillary sweat gland removal  . H/O blood transfusion reaction     at [facility]  . Headache(784.0)   . Diverticulitis   . Difficulty sleeping   . PMB (postmenopausal bleeding)     X 2 YRS  . Bowel trouble     OCCASIONAL BOWEL INCONTINENCE  . Diabetes mellitus without complication      diet controlled  . Fibroids     s/p TAH/BSO 07/2012  . CAD (coronary artery disease)     a. 8/07 had BMS to OM.  b. In-stent restenosis with later Promus DES to same site.  c. Lexiscan myoview in 1/13 showed EF 67%, no ischemia or infarction.  d. Echo (2/13) showed EF 55-60%, moderate LVH, mild MR.  e. Lex MV 6/14: no isch, EF 53%;  f. Echo 6/14: mild LVH, EF 55-60%, Gr 1 DD, MAC, mild MR, mild LAE  . Peripheral neuropathy   . Chronic diastolic CHF (congestive heart failure) 06/29/2014   Past Surgical History  Procedure Laterality Date  . Cervical fusion c-4-5 other    . Cervical laminectomy other    . Tonsillectomy other    . Umbilical hernia repair    . Axillary seat gland removal other    . Right knee arthroscopy other    . Colectomy  2004    left partial for perforation  . Cholecystectomy    . Tubal ligation    . Ptca and stenting      of mid and distal circumflex coronary artery. 1st stent was 04/2006, with drug eluting stent placed on 12/20/07 (30-50% RCA stenosis). Cardiologist Dr. Einar Gip.  . Dilation and curettage of uterus  02/2011  . Hysteroscopy w/d&c  05/04/2012    Procedure: DILATATION AND  CURETTAGE /HYSTEROSCOPY;  Surgeon: Lahoma Crocker, MD;  Location: [facility];  Service: Gynecology;  Laterality: N/A;  . Robotic assisted total hysterectomy with bilateral salpingo oopherectomy  07/24/2012    Procedure: ROBOTIC ASSISTED TOTAL HYSTERECTOMY WITH BILATERAL SALPINGO OOPHORECTOMY;  Surgeon: Janie Morning, MD PHD;  Location: [facility];  Service: Gynecology;  Laterality: Bilateral;  attempted ,  converted to abdomnial hysterectomy  . Abdominal hysterectomy  07/24/2012    Procedure: HYSTERECTOMY ABDOMINAL;  Surgeon: Janie Morning, MD PHD;  Location: [facility];  Service: Gynecology;;  . Salpingoophorectomy  07/24/2012    Procedure: SALPINGO OOPHORECTOMY;  Surgeon: Janie Morning, MD PHD;  Location: [facility];  Service: Gynecology;  Laterality: Bilateral;  . Abdominal wound dehiscence   08/01/2012    Procedure: ABDOMINAL WOUND DEHISCENCE;  Surgeon: Lahoma Crocker, MD;  Location: [facility];  Service: Gynecology;  Laterality: N/A;  . Laparotomy  08/02/2012    Procedure: EXPLORATORY LAPAROTOMY;  Surgeon: Imogene Burn. Georgette Dover, MD;  Location: [facility];  Service: General;  Laterality: N/A;  placement of abdominal wound vac  . Colonoscopy N/A 12/22/2014    Procedure: COLONOSCOPY;  Surgeon: Juanita Craver, MD;  Location: [facility]COPY;  Service: Endoscopy;  Laterality: N/A;  . Esophagogastroduodenoscopy Left 01/13/2015    Procedure: ESOPHAGOGASTRODUODENOSCOPY (EGD);  Surgeon: Carol Ada, MD;  Location: [facility]COPY;  Service: Endoscopy;  Laterality: Left;   Family History  Problem Relation Age of Onset  . Diabetes Mother   . Coronary artery disease Mother   . Diabetes Sister   . Coronary artery disease Sister    History  Substance Use Topics  . Smoking status: Never Smoker   . Smokeless tobacco: Never Used  . Alcohol Use: No   OB History    No data available     Review of Systems  All other systems reviewed and are negative.     Allergies  Review of patient's allergies indicates no known allergies.  Home Medications   Prior to Admission medications   Medication Sig Start Date End Date Taking? Authorizing Provider  amLODipine (NORVASC) 10 MG tablet Take 1 tablet (10 mg total) by mouth daily. 05/29/14  Yes Janith Lima, MD  BENICAR 20 MG tablet Take 20 mg by mouth daily.  03/29/15  Yes Historical Provider, MD  colchicine 0.6 MG tablet TAKE ONE TABLET BY MOUTH DAILY AS NEEDED 03/30/15  Yes Golden Circle, FNP  cyanocobalamin 2000 MCG tablet Take 1 tablet (2,000 mcg total) by mouth daily. 01/07/15  Yes Janith Lima, MD  FeAsp-B12-FA-C-DSS-SuccAc-Zn (FERIVA 21/7) 75-1 MG TABS Take 1 tablet by mouth daily. 04/15/15  Yes Janith Lima, MD  furosemide (LASIX) 20 MG tablet Take 20 mg by mouth daily as needed for fluid.  11/03/14  Yes Historical Provider, MD   HYDROcodone-acetaminophen (NORCO/VICODIN) 5-325 MG per tablet Take 1 tablet by mouth every 6 (six) hours as needed for moderate pain. 04/15/15  Yes Janith Lima, MD  metoprolol tartrate (LOPRESSOR) 25 MG tablet Take 1 tablet (25 mg total) by mouth every morning. 12/25/14  Yes Reyne Dumas, MD  NITROSTAT 0.4 MG SL tablet DISSOLVE ONE TABLET UNDER THE TONGUE EVERY 5 MINUTES AS NEEDED FOR CHEST PAIN.  DO NOT EXCEED A TOTAL OF 3 DOSES IN 15 MINUTES 03/30/15  Yes Larey Dresser, MD  pantoprazole (PROTONIX) 40 MG tablet Take 1 tablet (40 mg total) by mouth daily. 12/25/14  Yes Reyne Dumas, MD  VOLTAREN 1 % GEL Apply 2 g topically 4 (four) times daily as needed (  pain).  12/25/14   Historical Provider, MD   BP 131/74 mmHg  Pulse 99  Temp(Src) 97.3 F (36.3 C) (Oral)  Resp 35  SpO2 96% Physical Exam  Constitutional: She appears well-developed.  Elderly, obese  HENT:  Head: Normocephalic and atraumatic.  Right Ear: External ear normal.  Left Ear: External ear normal.  Eyes: Conjunctivae and EOM are normal. Pupils are equal, round, and reactive to light.  Neck: Normal range of motion and phonation normal. Neck supple.  Cardiovascular: Normal rate, regular rhythm and normal heart sounds.   Pulmonary/Chest: Effort normal and breath sounds normal. She exhibits no bony tenderness.  Abdominal: Soft. She exhibits no mass. There is tenderness (mild, diffuse). There is no rebound and no guarding.  Midline small abdominal wounds, 3, the uppermost is draining some serosanguineous and purulent material.  Musculoskeletal: Normal range of motion.  Neurological: She is alert. No cranial nerve deficit or sensory deficit. She exhibits normal muscle tone. Coordination normal.  Skin: Skin is warm, dry and intact.  Psychiatric: She has a normal mood and affect. Her behavior is normal.  Nursing note and vitals reviewed.   ED Course  Procedures (including critical care time) Medications - No data to  display  Patient Vitals for the past 24 hrs:  BP Temp Temp src Pulse Resp SpO2  04/20/15 2230 131/74 mmHg - - - (!) 35 -  04/20/15 2155 162/82 mmHg - - 99 16 96 %  04/20/15 1804 139/66 mmHg - - - - -  04/20/15 1758 - 97.3 F (36.3 C) Oral 97 14 100 %    11:34 PM Reevaluation with update and discussion. After initial assessment and treatment, an updated evaluation reveals patient is comfortable. Oxygen saturation 100% on room air, respiratory rate is 16, by me. Findings discussed with patient and daughter, all questions were answered.Daleen Bo L     Labs Review Labs Reviewed  BASIC METABOLIC PANEL - Abnormal; Notable for the following:    CO2 19 (*)    BUN 32 (*)    Creatinine, Ser 1.29 (*)    GFR calc non Af Amer 40 (*)    GFR calc Af Amer 46 (*)    All other components within normal limits  CBC - Abnormal; Notable for the following:    Hemoglobin 9.7 (*)    HCT 31.6 (*)    MCV 71.7 (*)    MCH 22.0 (*)    RDW 16.7 (*)    All other components within normal limits  I-STAT CHEM 8, ED - Abnormal; Notable for the following:    BUN 35 (*)    Creatinine, Ser 1.20 (*)    Hemoglobin 11.6 (*)    HCT 34.0 (*)    All other components within normal limits  I-STAT TROPOININ, ED    Imaging Review Ct Angio Chest Pe W/cm &/or Wo Cm  04/20/2015   CLINICAL DATA:  74 year old female with chest pain and elevated D-dimer.  EXAM: CT ANGIOGRAPHY CHEST WITH CONTRAST  TECHNIQUE: Multidetector CT imaging of the chest was performed using the standard protocol during bolus administration of intravenous contrast. Multiplanar CT image reconstructions and MIPs were obtained to evaluate the vascular anatomy.  CONTRAST:  75 mL of Omnipaque 350.  COMPARISON:  Chest CT 12/14/2010.  FINDINGS: Comment: Study is limited by a combination of respiratory motion and beam hardening artifact from the patient's large body habitus.  Mediastinum/Lymph Nodes: While there is no central, lobar or proximal segmental  sized filling defect to  suggest pulmonary embolism, smaller distal segmental and subsegmental sized emboli cannot be excluded secondary to respiratory motion and excessive image noise from the patient's large body habitus. Heart size is normal. There is no significant pericardial fluid, thickening or pericardial calcification. There is atherosclerosis of the thoracic aorta, the great vessels of the mediastinum and the coronary arteries, including calcified atherosclerotic plaque in the left anterior descending, left circumflex and right coronary arteries. No pathologically enlarged mediastinal or hilar lymph nodes. Esophagus is unremarkable in appearance. No axillary lymphadenopathy. Right upper extremity PICC with tip terminating in the mid superior vena cava.  Lungs/Pleura: No acute consolidative airspace disease. No pleural effusions. No suspicious appearing pulmonary nodules or masses.  Upper Abdomen: Status post cholecystectomy. Small calcified granulomas in the liver.  Musculoskeletal/Soft Tissues: There are no aggressive appearing lytic or blastic lesions noted in the visualized portions of the skeleton.  Review of the MIP images confirms the above findings.  IMPRESSION: 1. Although today's study is limited by excessive image noise and patient respiratory motion, there is no central, lobar or proximal segmental sized pulmonary embolism. Smaller distal segmental and subsegmental sized emboli cannot be entirely excluded. 2. Atherosclerosis, including three-vessel coronary artery disease. Assessment for potential risk factor modification, dietary therapy or pharmacologic therapy may be warranted, if clinically indicated. 3. Additional incidental findings, as above.   Electronically Signed   By: Vinnie Langton M.D.   On: 04/20/2015 16:40   Dg Chest Port 1 View  04/20/2015   CLINICAL DATA:  Acute onset of shortness of breath and centralized chest pain. Initial encounter.  EXAM: PORTABLE CHEST - 1 VIEW   COMPARISON:  Chest radiograph performed 03/15/2015, and CTA of the chest performed earlier today at 2:38 p.m.  FINDINGS: The lungs are hypoexpanded. Mild left basilar atelectasis is noted. No definite pleural effusion or pneumothorax is seen. There is elevation of the right hemidiaphragm.  The cardiomediastinal silhouette is borderline normal in size. No acute osseous abnormalities are identified.  IMPRESSION: Lungs hypoexpanded. Mild left basilar atelectasis noted. Elevation of the right hemidiaphragm.   Electronically Signed   By: Garald Balding M.D.   On: 04/20/2015 22:23     EKG Interpretation   Date/Time:  Monday April 20 2015 17:58:45 EDT Ventricular Rate:  91 PR Interval:  136 QRS Duration: 70 QT Interval:  366 QTC Calculation: 450 R Axis:   0 Text Interpretation:  Normal sinus rhythm Normal ECG since last tracing no  significant change Confirmed by Burnis Kaser  MD, Fifi Schindler ([id number]) on 04/20/2015  10:08:35 PM      MDM   Final diagnoses:  Nonspecific chest pain  Dyspnea    Nonspecific chest pain and shortness of breath. CT scan today did not reveal large or subsegmental PE. Vital signs are normal with the exception of mild elevation of blood pressure. Oxygenation and respiratory efforts are normal. Symptoms are subacute. Chest x-ray shows hypoventilation, likely related to her abdominal process. This should gradually improved, after her abdomen heals. She may benefit from physical, or respiratory therapy. Patient is moderately debilitated. Doubt ACS, PE, serious bacterial infection, metabolic instability or impending vascular collapse.   Nursing Notes Reviewed/ Care Coordinated Applicable Imaging Reviewed Interpretation of Laboratory Data incorporated into ED treatment  The patient appears reasonably screened and/or stabilized for discharge and I doubt any other medical condition or other [facility] requiring further screening, evaluation, or treatment in the ED at this time prior to  discharge.  Plan: Home Medications- usual; Home Treatments- rest; return here if  the recommended treatment, does not improve the symptoms; Recommended follow up- PCP prn   Daleen Bo, MD 04/20/15 2337

## 2015-04-20 NOTE — Discharge Instructions (Signed)

## 2015-04-27 ENCOUNTER — Inpatient Hospital Stay (HOSPITAL_COMMUNITY)
Admission: AD | Admit: 2015-04-27 | Discharge: 2015-05-21 | DRG: 907 | Disposition: A | Discharge status: Home Health | Payer: Medicare Other | Source: Ambulatory Visit | Attending: Surgery | Admitting: Surgery

## 2015-04-27 ENCOUNTER — Encounter (HOSPITAL_COMMUNITY): Payer: Self-pay | Admitting: General Practice

## 2015-04-27 ENCOUNTER — Inpatient Hospital Stay (HOSPITAL_COMMUNITY): Payer: Medicare Other

## 2015-04-27 DIAGNOSIS — E875 Hyperkalemia: Secondary | ICD-10-CM | POA: Clinically undetermined

## 2015-04-27 DIAGNOSIS — Y831 Surgical operation with implant of artificial internal device as the cause of abnormal reaction of the patient, or of later complication, without mention of misadventure at the time of the procedure: Secondary | ICD-10-CM | POA: Diagnosis present

## 2015-04-27 DIAGNOSIS — Z9049 Acquired absence of other specified parts of digestive tract: Secondary | ICD-10-CM | POA: Diagnosis present

## 2015-04-27 DIAGNOSIS — I2510 Atherosclerotic heart disease of native coronary artery without angina pectoris: Secondary | ICD-10-CM | POA: Diagnosis not present

## 2015-04-27 DIAGNOSIS — K449 Diaphragmatic hernia without obstruction or gangrene: Secondary | ICD-10-CM | POA: Diagnosis present

## 2015-04-27 DIAGNOSIS — G4733 Obstructive sleep apnea (adult) (pediatric): Secondary | ICD-10-CM | POA: Diagnosis not present

## 2015-04-27 DIAGNOSIS — I5033 Acute on chronic diastolic (congestive) heart failure: Secondary | ICD-10-CM | POA: Diagnosis present

## 2015-04-27 DIAGNOSIS — E871 Hypo-osmolality and hyponatremia: Secondary | ICD-10-CM | POA: Diagnosis present

## 2015-04-27 DIAGNOSIS — N179 Acute kidney failure, unspecified: Secondary | ICD-10-CM | POA: Diagnosis not present

## 2015-04-27 DIAGNOSIS — Z794 Long term (current) use of insulin: Secondary | ICD-10-CM

## 2015-04-27 DIAGNOSIS — M10072 Idiopathic gout, left ankle and foot: Secondary | ICD-10-CM | POA: Insufficient documentation

## 2015-04-27 DIAGNOSIS — K219 Gastro-esophageal reflux disease without esophagitis: Secondary | ICD-10-CM | POA: Diagnosis present

## 2015-04-27 DIAGNOSIS — Z9861 Coronary angioplasty status: Secondary | ICD-10-CM

## 2015-04-27 DIAGNOSIS — D509 Iron deficiency anemia, unspecified: Secondary | ICD-10-CM | POA: Diagnosis not present

## 2015-04-27 DIAGNOSIS — M549 Dorsalgia, unspecified: Secondary | ICD-10-CM | POA: Diagnosis present

## 2015-04-27 DIAGNOSIS — E1122 Type 2 diabetes mellitus with diabetic chronic kidney disease: Secondary | ICD-10-CM | POA: Diagnosis present

## 2015-04-27 DIAGNOSIS — Z9119 Patient's noncompliance with other medical treatment and regimen: Secondary | ICD-10-CM | POA: Diagnosis present

## 2015-04-27 DIAGNOSIS — M25579 Pain in unspecified ankle and joints of unspecified foot: Secondary | ICD-10-CM

## 2015-04-27 DIAGNOSIS — K632 Fistula of intestine: Secondary | ICD-10-CM | POA: Diagnosis present

## 2015-04-27 DIAGNOSIS — D62 Acute posthemorrhagic anemia: Secondary | ICD-10-CM | POA: Diagnosis not present

## 2015-04-27 DIAGNOSIS — E780 Pure hypercholesterolemia: Secondary | ICD-10-CM | POA: Diagnosis present

## 2015-04-27 DIAGNOSIS — N183 Chronic kidney disease, stage 3 unspecified: Secondary | ICD-10-CM | POA: Diagnosis present

## 2015-04-27 DIAGNOSIS — I10 Essential (primary) hypertension: Secondary | ICD-10-CM | POA: Diagnosis not present

## 2015-04-27 DIAGNOSIS — T8579XA Infection and inflammatory reaction due to other internal prosthetic devices, implants and grafts, initial encounter: Secondary | ICD-10-CM | POA: Diagnosis present

## 2015-04-27 DIAGNOSIS — E669 Obesity, unspecified: Secondary | ICD-10-CM | POA: Diagnosis present

## 2015-04-27 DIAGNOSIS — Z86718 Personal history of other venous thrombosis and embolism: Secondary | ICD-10-CM

## 2015-04-27 DIAGNOSIS — I129 Hypertensive chronic kidney disease with stage 1 through stage 4 chronic kidney disease, or unspecified chronic kidney disease: Secondary | ICD-10-CM | POA: Diagnosis present

## 2015-04-27 DIAGNOSIS — L02211 Cutaneous abscess of abdominal wall: Secondary | ICD-10-CM | POA: Diagnosis present

## 2015-04-27 DIAGNOSIS — E785 Hyperlipidemia, unspecified: Secondary | ICD-10-CM | POA: Diagnosis present

## 2015-04-27 DIAGNOSIS — M1990 Unspecified osteoarthritis, unspecified site: Secondary | ICD-10-CM | POA: Diagnosis present

## 2015-04-27 DIAGNOSIS — E869 Volume depletion, unspecified: Secondary | ICD-10-CM | POA: Diagnosis not present

## 2015-04-27 DIAGNOSIS — E46 Unspecified protein-calorie malnutrition: Secondary | ICD-10-CM | POA: Diagnosis present

## 2015-04-27 DIAGNOSIS — Z6837 Body mass index (BMI) 37.0-37.9, adult: Secondary | ICD-10-CM

## 2015-04-27 DIAGNOSIS — E119 Type 2 diabetes mellitus without complications: Secondary | ICD-10-CM | POA: Diagnosis not present

## 2015-04-27 HISTORY — DX: Chronic kidney disease, stage 3 (moderate): N18.3

## 2015-04-27 HISTORY — DX: Dependence on other enabling machines and devices: Z99.89

## 2015-04-27 HISTORY — DX: Fistula of intestine: K63.2

## 2015-04-27 HISTORY — DX: Obstructive sleep apnea (adult) (pediatric): G47.33

## 2015-04-27 HISTORY — DX: Personal history of other diseases of the digestive system: Z87.19

## 2015-04-27 HISTORY — DX: Type 2 diabetes mellitus without complications: E11.9

## 2015-04-27 HISTORY — DX: Chronic kidney disease, stage 3 unspecified: N18.30

## 2015-04-27 LAB — COMPREHENSIVE METABOLIC PANEL
ALT: 16 U/L (ref 14–54)
AST: 21 U/L (ref 15–41)
Albumin: 2.9 g/dL — ABNORMAL LOW (ref 3.5–5.0)
Alkaline Phosphatase: 78 U/L (ref 38–126)
Anion gap: 7 (ref 5–15)
BILIRUBIN TOTAL: 0.3 mg/dL (ref 0.3–1.2)
BUN: 32 mg/dL — ABNORMAL HIGH (ref 6–20)
CHLORIDE: 104 mmol/L (ref 101–111)
CO2: 26 mmol/L (ref 22–32)
CREATININE: 1.48 mg/dL — AB (ref 0.44–1.00)
Calcium: 9.3 mg/dL (ref 8.9–10.3)
GFR calc Af Amer: 39 mL/min — ABNORMAL LOW (ref >60)
GFR, EST NON AFRICAN AMERICAN: 34 mL/min — AB (ref >60)
Glucose, Bld: 87 mg/dL (ref 65–99)
Potassium: 4 mmol/L (ref 3.5–5.1)
Sodium: 137 mmol/L (ref 135–145)
TOTAL PROTEIN: 7.4 g/dL (ref 6.5–8.1)

## 2015-04-27 LAB — CBC
HCT: 27.2 % — ABNORMAL LOW (ref 36.0–46.0)
Hemoglobin: 8.2 g/dL — ABNORMAL LOW (ref 12.0–15.0)
MCH: 21.8 pg — AB (ref 26.0–34.0)
MCHC: 30.1 g/dL (ref 30.0–36.0)
MCV: 72.3 fL — AB (ref 78.0–100.0)
PLATELETS: 244 10*3/uL (ref 150–400)
RBC: 3.76 MIL/uL — AB (ref 3.87–5.11)
RDW: 16 % — AB (ref 11.5–15.5)
WBC: 6.5 10*3/uL (ref 4.0–10.5)

## 2015-04-27 LAB — PROTIME-INR
INR: 1.13 (ref 0.00–1.49)
Prothrombin Time: 14.6 seconds (ref 11.6–15.2)

## 2015-04-27 LAB — SURGICAL PCR SCREEN
MRSA, PCR: NEGATIVE
STAPHYLOCOCCUS AUREUS: NEGATIVE

## 2015-04-27 LAB — GLUCOSE, CAPILLARY
Glucose-Capillary: 77 mg/dL (ref 65–99)
Glucose-Capillary: 70 mg/dL (ref 65–99)

## 2015-04-27 MED ORDER — IRBESARTAN 150 MG PO TABS
150.0000 mg | ORAL_TABLET | Freq: Every day | ORAL | Status: DC
  Administered 2015-04-27 – 2015-04-28 (×2): 150 mg via ORAL
  Filled 2015-04-27 (×2): qty 1

## 2015-04-27 MED ORDER — FERIVA 21/7 75-1 MG PO TABS: 1.0000 | ORAL_TABLET | Freq: Every day | ORAL | Status: DC

## 2015-04-27 MED ORDER — DEXTROSE 5 % IV SOLN
1.0000 g | Freq: Once | INTRAVENOUS | Status: AC
  Administered 2015-04-28: 1 g via INTRAVENOUS
  Filled 2015-04-27: qty 1

## 2015-04-27 MED ORDER — DIPHENHYDRAMINE HCL 12.5 MG/5ML PO ELIX: 12.5000 mg | ORAL_SOLUTION | Freq: Four times a day (QID) | ORAL | Status: DC | PRN

## 2015-04-27 MED ORDER — SODIUM CHLORIDE 0.9 % IJ SOLN
10.0000 mL | INTRAMUSCULAR | Status: DC | PRN
  Administered 2015-04-27: 20 mL
  Administered 2015-04-28 – 2015-05-08 (×12): 10 mL
  Administered 2015-05-09: 20 mL
  Administered 2015-05-10 – 2015-05-11 (×2): 10 mL
  Administered 2015-05-11: 20 mL
  Administered 2015-05-12 – 2015-05-19 (×6): 10 mL
  Administered 2015-05-21: 20 mL
  Administered 2015-05-21 (×2): 10 mL
  Filled 2015-04-27 (×26): qty 40

## 2015-04-27 MED ORDER — AMLODIPINE BESYLATE 10 MG PO TABS
10.0000 mg | ORAL_TABLET | Freq: Every day | ORAL | Status: DC
  Administered 2015-04-27: 10 mg via ORAL
  Filled 2015-04-27 (×2): qty 1

## 2015-04-27 MED ORDER — VITAMIN B-12 1000 MCG PO TABS
2000.0000 ug | ORAL_TABLET | Freq: Every day | ORAL | Status: DC
  Administered 2015-04-27 – 2015-05-21 (×25): 2000 ug via ORAL
  Filled 2015-04-27 (×26): qty 2

## 2015-04-27 MED ORDER — SODIUM CHLORIDE 0.9 % IV SOLN
INTRAVENOUS | Status: AC
  Administered 2015-04-27: 16:00:00 via INTRAVENOUS

## 2015-04-27 MED ORDER — ONDANSETRON HCL 4 MG/2ML IJ SOLN
4.0000 mg | Freq: Four times a day (QID) | INTRAMUSCULAR | Status: DC | PRN
  Administered 2015-04-27 – 2015-05-20 (×6): 4 mg via INTRAVENOUS
  Filled 2015-04-27 (×6): qty 2

## 2015-04-27 MED ORDER — NITROGLYCERIN 0.4 MG SL SUBL: 0.4000 mg | SUBLINGUAL_TABLET | SUBLINGUAL | Status: DC | PRN

## 2015-04-27 MED ORDER — BOOST / RESOURCE BREEZE PO LIQD
1.0000 | Freq: Three times a day (TID) | ORAL | Status: DC
  Administered 2015-04-27 – 2015-05-01 (×2): 1 via ORAL

## 2015-04-27 MED ORDER — POLYSACCHARIDE IRON COMPLEX 150 MG PO CAPS
150.0000 mg | ORAL_CAPSULE | Freq: Every day | ORAL | Status: DC
  Administered 2015-04-27 – 2015-05-21 (×24): 150 mg via ORAL
  Filled 2015-04-27 (×25): qty 1

## 2015-04-27 MED ORDER — ENOXAPARIN SODIUM 30 MG/0.3ML ~~LOC~~ SOLN
30.0000 mg | SUBCUTANEOUS | Status: DC
Start: 2015-04-27 — End: 2015-05-04
  Administered 2015-04-28 – 2015-05-03 (×6): 30 mg via SUBCUTANEOUS
  Filled 2015-04-27 (×6): qty 0.3

## 2015-04-27 MED ORDER — DIPHENHYDRAMINE HCL 50 MG/ML IJ SOLN: 12.5000 mg | Freq: Four times a day (QID) | INTRAMUSCULAR | Status: DC | PRN

## 2015-04-27 MED ORDER — MUPIROCIN 2 % EX OINT
TOPICAL_OINTMENT | Freq: Two times a day (BID) | CUTANEOUS | Status: DC
  Administered 2015-04-27 – 2015-04-30 (×5): via NASAL
  Administered 2015-05-01: 1 via NASAL
  Administered 2015-05-02 (×2): via NASAL
  Administered 2015-05-03: 1 via NASAL
  Administered 2015-05-03 – 2015-05-07 (×7): via NASAL
  Administered 2015-05-07: 1 via NASAL
  Administered 2015-05-08 – 2015-05-13 (×9): via NASAL
  Administered 2015-05-13: 1 via NASAL
  Administered 2015-05-14 – 2015-05-21 (×13): via NASAL
  Filled 2015-04-27 (×5): qty 22

## 2015-04-27 MED ORDER — ONDANSETRON 4 MG PO TBDP
4.0000 mg | ORAL_TABLET | Freq: Four times a day (QID) | ORAL | Status: DC | PRN
  Filled 2015-04-27: qty 1

## 2015-04-27 MED ORDER — CHLORHEXIDINE GLUCONATE CLOTH 2 % EX PADS
6.0000 | MEDICATED_PAD | Freq: Every day | CUTANEOUS | Status: DC
Start: 2015-04-28 — End: 2015-05-21
  Administered 2015-04-28 – 2015-05-21 (×22): 6 via TOPICAL

## 2015-04-27 MED ORDER — METOPROLOL TARTRATE 25 MG PO TABS
25.0000 mg | ORAL_TABLET | Freq: Every morning | ORAL | Status: DC
  Administered 2015-04-27 – 2015-05-10 (×13): 25 mg via ORAL
  Filled 2015-04-27 (×15): qty 1

## 2015-04-27 MED ORDER — COLCHICINE 0.6 MG PO TABS
0.6000 mg | ORAL_TABLET | Freq: Every day | ORAL | Status: DC | PRN
  Administered 2015-05-03 – 2015-05-04 (×2): 0.6 mg via ORAL
  Filled 2015-04-27 (×2): qty 1

## 2015-04-27 MED ORDER — PANTOPRAZOLE SODIUM 40 MG IV SOLR
40.0000 mg | Freq: Every day | INTRAVENOUS | Status: DC
  Administered 2015-04-27 – 2015-05-20 (×24): 40 mg via INTRAVENOUS
  Filled 2015-04-27 (×24): qty 40

## 2015-04-27 MED ORDER — MORPHINE SULFATE 2 MG/ML IJ SOLN
2.0000 mg | INTRAMUSCULAR | Status: DC | PRN
  Administered 2015-04-27 – 2015-04-28 (×2): 2 mg via INTRAVENOUS
  Filled 2015-04-27 (×2): qty 1

## 2015-04-27 NOTE — Consult Note (Addendum)
[facility] Medical Consultation  Marissa [name] WER:[id number] DOB: 1940/11/04 DOA: 04/27/2015 PCP: [name], MD   Requesting physician: Dr. Donnie Mesa, General Surgery Date of consultation: 04/27/2015  Reason for consultation: Evaluation and assistance of multiple medical problems and pre op evaluation  Impression/Recommendations Active Problems:   Enterocutaneous fistula    1. Abdominal wound/enterocutaneous fistula: Admitted by the surgical service for surgical debridement possibly in the next day or 2. Based on available data, patient is at least at moderate risk for perioperative cardiac events. Cardiac status stable. May proceed with indicated procedure with close perioperative monitoring without any further cardiac evaluation.  2. Essential hypertension: Controlled. Continue amlodipine, Benicar and metoprolol. 3. Stage III chronic kidney disease: No labs since 04/20/15. Will request. Was stable prior to that. 4. Chronic diastolic CHF: Compensated. Monitor closely and avoid volume overload. 5. OSA: Not compliant with CPAP and uses it up to 1-2 times per week. CPAP at bedtime while hospitalized. 6. GERD: Continue PPI. 7. Diet-controlled DM 2: Monitor CBGs and treat with SSI if CBGs greater than 180 consistently. 8. DVT history: Not on anticoagulation secondary to recurrent diverticular and gastric bleeding 9. Iron deficiency anemia: Again no labs since 04/20/15. Will repeat labs. Continue iron supplements. 10. CAD: Stable without symptoms and no EKG changes. Continue perioperative beta blockers. 11. DVT prophylaxis: SCDs. Consider subcutaneous heparin or Lovenox-defer to primary service based on surgical limitations.  I will followup again tomorrow. Please contact me if I can be of assistance in the meanwhile. Thank you for this consultation.  Chief Complaint: Intermittent abdominal pain.  HPI:  74 year old female patient with extensive past surgical history-left  hemicolectomy with left transverse colon to sigmoid colon anastomosis/appendectomy/umbilical hernia repair (2003), repair of incarcerated ventral incisional hernia (2009), TAH with bilateral salpingo-oophorectomy with extensive enterolysis in 1950-DTOIZT complicated by enterocutaneous fistula (2013) which subsequently healed, June 2016 admitted again for large subcutaneous abscess and enterocutaneous fistula-managed with Eaken's pouch, bowel rest, TPN and Invanz, followed up by primary surgeon in office and due to concern for persisting underlying abscess patient admitted to hospital on 04/27/15 with plans for I & D over the next couple of days under [facility]. She also has PMH of CAD, s/p OM BMS in August 2007 with ISR treated with a DES April 2009, Done in August 2009 showed patency of the stent, Myoview October 2015 was low risk and echo at that time showed EF 55-60 percent. Cardiology evaluated her during recent hospitalization, determined that her cardiac status was stable and cleared her for laparotomy. She also has PMH of HTN, stage III chronic kidney disease, chronic diastolic CHF, gout, OSA, GERD, HLD, diet controlled DM, DVT-not on anticoagulation secondary to recurrent diverticular and gastric bleeding and iron deficiency anemia. Patient complains of intermittent abdominal pain, rated at mild-moderate, no specific aggravating or relieving factors, states that she has associated chronic diarrhea of up to 3 BMs daily since the beginning of this year. She denies fever or chills. Appetite is poor. Apparently getting TNA at home via right upper extremity PICC. Occasional dyspnea at night for which she uses CPAP up to 2 times per week and not regularly. Denies chest pain, cough or palpitations.  Review of Systems:  All systems reviewed and apart from history of presenting illness, are negative.  Past Medical History  Diagnosis Date  . Hypertension   . Renal insufficiency     baseline Cr ~ 1.3  .  Hyperlipidemia   . History of DVT (deep vein thrombosis)   .  History of colonic polyps   . Gout   . Polyarthritis   . Microcytic anemia   . OSA (obstructive sleep apnea)   . GERD (gastroesophageal reflux disease)   . Esophagitis   . Hidradenitis suppurativa     s/p axillary sweat gland removal  . H/O blood transfusion reaction     at [facility]  . Headache(784.0)   . Diverticulitis   . Difficulty sleeping   . PMB (postmenopausal bleeding)     X 2 YRS  . Bowel trouble     OCCASIONAL BOWEL INCONTINENCE  . Diabetes mellitus without complication     diet controlled  . Fibroids     s/p TAH/BSO 07/2012  . CAD (coronary artery disease)     a. 8/07 had BMS to OM.  b. In-stent restenosis with later Promus DES to same site.  c. Lexiscan myoview in 1/13 showed EF 67%, no ischemia or infarction.  d. Echo (2/13) showed EF 55-60%, moderate LVH, mild MR.  e. Lex MV 6/14: no isch, EF 53%;  f. Echo 6/14: mild LVH, EF 55-60%, Gr 1 DD, MAC, mild MR, mild LAE  . Peripheral neuropathy   . Chronic diastolic CHF (congestive heart failure) 06/29/2014   Past Surgical History  Procedure Laterality Date  . Cervical fusion c-4-5 other    . Cervical laminectomy other    . Tonsillectomy other    . Umbilical hernia repair    . Axillary seat gland removal other    . Right knee arthroscopy other    . Colectomy  2004    left partial for perforation  . Cholecystectomy    . Tubal ligation    . Ptca and stenting      of mid and distal circumflex coronary artery. 1st stent was 04/2006, with drug eluting stent placed on 12/20/07 (30-50% RCA stenosis). Cardiologist Dr. Einar Gip.  . Dilation and curettage of uterus  02/2011  . Hysteroscopy w/d&c  05/04/2012    Procedure: DILATATION AND CURETTAGE /HYSTEROSCOPY;  Surgeon: Lahoma Crocker, MD;  Location: [facility];  Service: Gynecology;  Laterality: N/A;  . Robotic assisted total hysterectomy with bilateral salpingo oopherectomy  07/24/2012    Procedure: ROBOTIC  ASSISTED TOTAL HYSTERECTOMY WITH BILATERAL SALPINGO OOPHORECTOMY;  Surgeon: Janie Morning, MD PHD;  Location: [facility];  Service: Gynecology;  Laterality: Bilateral;  attempted ,  converted to abdomnial hysterectomy  . Abdominal hysterectomy  07/24/2012    Procedure: HYSTERECTOMY ABDOMINAL;  Surgeon: Janie Morning, MD PHD;  Location: [facility];  Service: Gynecology;;  . Salpingoophorectomy  07/24/2012    Procedure: SALPINGO OOPHORECTOMY;  Surgeon: Janie Morning, MD PHD;  Location: [facility];  Service: Gynecology;  Laterality: Bilateral;  . Abdominal wound dehiscence  08/01/2012    Procedure: ABDOMINAL WOUND DEHISCENCE;  Surgeon: Lahoma Crocker, MD;  Location: [facility];  Service: Gynecology;  Laterality: N/A;  . Laparotomy  08/02/2012    Procedure: EXPLORATORY LAPAROTOMY;  Surgeon: Imogene Burn. Georgette Dover, MD;  Location: [facility];  Service: General;  Laterality: N/A;  placement of abdominal wound vac  . Colonoscopy N/A 12/22/2014    Procedure: COLONOSCOPY;  Surgeon: Juanita Craver, MD;  Location: [facility];  Service: Endoscopy;  Laterality: N/A;  . Esophagogastroduodenoscopy Left 01/13/2015    Procedure: ESOPHAGOGASTRODUODENOSCOPY (EGD);  Surgeon: Carol Ada, MD;  Location: [facility];  Service: Endoscopy;  Laterality: Left;   Social History:  reports that she has never smoked. She has never used smokeless tobacco. She reports that she does not drink alcohol or use  illicit drugs.  Patient is widowed. Lives with her daughter. Independent of activities of daily living.  No Known Allergies Family History  Problem Relation Age of Onset  . Diabetes Mother   . Coronary artery disease Mother   . Diabetes Sister   . Coronary artery disease Sister     Prior to Admission medications   Medication Sig Start Date End Date Taking? Authorizing Provider  amLODipine (NORVASC) 10 MG tablet Take 1 tablet (10 mg total) by mouth daily. 05/29/14  Yes Janith Lima, MD  BENICAR 20 MG tablet Take 20 mg by mouth daily.   03/29/15  Yes Historical Provider, MD  colchicine 0.6 MG tablet TAKE ONE TABLET BY MOUTH DAILY AS NEEDED 03/30/15  Yes Golden Circle, FNP  cyanocobalamin 2000 MCG tablet Take 1 tablet (2,000 mcg total) by mouth daily. 01/07/15  Yes Janith Lima, MD  FeAsp-B12-FA-C-DSS-SuccAc-Zn (FERIVA 21/7) 75-1 MG TABS Take 1 tablet by mouth daily. 04/15/15  Yes Janith Lima, MD  furosemide (LASIX) 20 MG tablet Take 20 mg by mouth daily as needed for fluid.  11/03/14  Yes Historical Provider, MD  HYDROcodone-acetaminophen (NORCO/VICODIN) 5-325 MG per tablet Take 1 tablet by mouth every 6 (six) hours as needed for moderate pain. 04/15/15  Yes Janith Lima, MD  metoprolol tartrate (LOPRESSOR) 25 MG tablet Take 1 tablet (25 mg total) by mouth every morning. 12/25/14  Yes Reyne Dumas, MD  NITROSTAT 0.4 MG SL tablet DISSOLVE ONE TABLET UNDER THE TONGUE EVERY 5 MINUTES AS NEEDED FOR CHEST PAIN.  DO NOT EXCEED A TOTAL OF 3 DOSES IN 15 MINUTES 03/30/15  Yes Larey Dresser, MD  pantoprazole (PROTONIX) 40 MG tablet Take 1 tablet (40 mg total) by mouth daily. 12/25/14  Yes Reyne Dumas, MD  VOLTAREN 1 % GEL Apply 2 g topically 4 (four) times daily as needed (pain).  12/25/14  Yes Historical Provider, MD   Physical Exam:  Filed Vitals:   04/27/15 0944 04/27/15 1256  BP: 123/67 113/43  Pulse: 85 82  Temp: 97.7 F (36.5 C) 98.6 F (37 C)  TempSrc: Oral Oral  Resp: 18 19  SpO2: 100% 100%     General exam: Moderately built and nourished pleasant elderly female patient, lying comfortably supine in bed in no obvious distress.  Head, eyes and ENT: Nontraumatic and normocephalic. Pupils equally reacting to light and accommodation. Oral mucosa moist.  Neck: Supple. No JVD, carotid bruit or thyromegaly.  Lymphatics: No lymphadenopathy.  Respiratory system: Clear to auscultation. No increased work of breathing.  Cardiovascular system: S1 and S2 heard, RRR. No JVD, murmurs, gallops, clicks or pedal  edema.  Gastrointestinal system: Abdomen is nondistended, soft and nontender. Normal bowel sounds heard. No organomegaly or masses appreciated. Wound site dressing clean and dry.  Central nervous system: Alert and oriented. No focal neurological deficits.  Extremities: Symmetric 5 x 5 power. Peripheral pulses symmetrically felt. Right upper extremity PICC line was exchanged by PICC team a few minutes ago.  Skin: No rashes or acute findings.  Musculoskeletal system: Negative exam.  Psychiatry: Pleasant and cooperative.  Labs on Admission:  Basic Metabolic Panel:  Recent Labs Lab 04/20/15 1806 04/20/15 1821  NA 137 139  K 4.3 4.2  CL 105 103  CO2 19*  --   GLUCOSE 73 75  BUN 32* 35*  CREATININE 1.29* 1.20*  CALCIUM 9.4  --    Liver Function Tests: No results for input(s): AST, ALT, ALKPHOS, BILITOT, PROT, ALBUMIN in the  last 168 hours. No results for input(s): LIPASE, AMYLASE in the last 168 hours. No results for input(s): AMMONIA in the last 168 hours. CBC:  Recent Labs Lab 04/20/15 1800 04/20/15 1821  WBC 7.6  --   HGB 9.7* 11.6*  HCT 31.6* 34.0*  MCV 71.7*  --   PLT 223  --    Cardiac Enzymes: No results for input(s): CKTOTAL, CKMB, CKMBINDEX, TROPONINI in the last 168 hours. BNP: Invalid input(s): POCBNP CBG: No results for input(s): GLUCAP in the last 168 hours.  Radiological Exams on Admission: Dg Chest Port 1 View  04/27/2015   CLINICAL DATA:  Patient for revision of PICC line.  EXAM: PORTABLE CHEST - 1 VIEW  COMPARISON:  Single view of the chest 04/20/2015.  FINDINGS: Right PICC remains in place with the tip projecting at the confluence of the brachiocephalic veins. The lungs are clear. Heart size is normal. No pneumothorax or pleural effusion. Mild asymmetric elevation of the right hemidiaphragm relative to the left is noted.  IMPRESSION: Tip of right PICC projects at the confluence of the brachiocephalic veins. No acute disease.   Electronically Signed    By: Inge Rise M.D.   On: 04/27/2015 11:29    EKG 04/20/15: Independently reviewed. Sinus rhythm, normal axis and no acute changes. QTC 450 ms.  Time spent: 60 minutes  [facility] Pager [phone]  If 7PM-7AM, please contact night-coverage [email or web address] Password [facility] 04/27/2015, 2:54 PM

## 2015-04-27 NOTE — Progress Notes (Signed)
Peripherally Inserted Central Catheter/Midline Placement  The IV Nurse has discussed with the patient and/or persons authorized to consent for the patient, the purpose of this procedure and the potential benefits and risks involved with this procedure.  The benefits include less needle sticks, lab draws from the catheter and patient may be discharged home with the catheter.  Risks include, but not limited to, infection, bleeding, blood clot (thrombus formation), and puncture of an artery; nerve damage and irregular heat beat.  Alternatives to this procedure were also discussed.  Consent obtained by Margretta Sidle, RN for PICC exchange.  PICC/Midline Placement Documentation       Trammell Bowden, Nicolette Bang 04/27/2015, 2:19 PM

## 2015-04-27 NOTE — Progress Notes (Signed)
[facility]  Patient Status:   Active pt with [facility] up to the time of her readmission  [facility] is providing the following services: HHRN and Home TPN Pharmacy Team for home TPN.    See [facility] formula below. [facility] hospital Team will follow pt and support DC to home when ordered.    If patient discharges after hours, please call [phone].   Larry Sierras 04/27/2015, 10:34 PM

## 2015-04-27 NOTE — Plan of Care (Signed)
Pt admitted to 6n10. Daughter at bedside. Abd dsg changed at this time. It was completely saturated. Meagan PA notified of arrival.

## 2015-04-27 NOTE — H&P (Addendum)
History of Present Illness Patient words: reck.  The patient is a 74 year old female presenting to discuss diagnostic procedure results. The patient is a 74 year old female who presents with a subcutaneous abscess and a very complex past surgical history. In 2003 she underwent a left hemicolectomy with left transverse colon to sigmoid colon anastomosis by Dr. Alphonsa Overall for left colon diverticulitis. He also repaired her umbilical hernia and removed her appendix. In 2009 she underwent repair of incarcerated ventral incisional hernia by Dr. Johnathan Hausen. This was repaired with Ventralex mesh secured with Prolene sutures. In 2013 she underwent an attempted laparoscopic hysterectomy converted to an open total abdominal hysterectomy with bilateral salpingo-oophorectomy. This required extensive enterolysis. The surgeon was Dr. Janie Morning. Several days after that surgery she returned to the hospital with foul-smelling fluid coming from her wound. 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 We managed this conservatively with a VAC dressing. The wound subsequently healed. She was last seen in our office in January 2014 with a completely healed wound. She has had several admissions for GI bleeding. In late June of this year she noticed some swelling over the middle of her midline scar. 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 She was managed with a Eakin's pouch, bowel rest, TPN, and Invanz. Fistula studies were unable to locate the location of her fistula. The fistula output has decreased. She was discharged after 15 days in the hospital. She is in my office for follow-up since I was the last person that operated on her. I did not see her at all while she was in the hospital. Her wound has closed down to 2 very small openings with some purulent drainage. She is cycling on her TNA. She is  tolerating some oral diet. She is having regular bowel movements.  At the last visit, it appeared that she had some purulent fluid coming from her small midline wound, but no sign of succus entericus or stool. The CT scan, however, shows evidence of an enterocutaneous fistula. Therefore, we have kept her on her cyclic TNA. The wound continues to just drain purulent fluid. I asked them to come in today to review the CT scan.   CLINICAL DATA: Abdominal wall abscess in the umbilical region for 1 month. History of umbilical hernia repair. Partial left colectomy, tubal ligation, hysterectomy.  EXAM: CT ABDOMEN AND PELVIS WITH CONTRAST  TECHNIQUE: Multidetector CT imaging of the abdomen and pelvis was performed using the standard protocol following bolus administration of intravenous contrast.  CONTRAST: 123mL ISOVUE-300 IOPAMIDOL (ISOVUE-300) INJECTION 61%  COMPARISON: 03/20/2015 and 03/13/2015 similar exam, fistula injection 03/23/2015  FINDINGS: Lower chest: Lung bases are clear.  Hepatobiliary: Cholecystectomy clips are noted. Liver is unremarkable.  Pancreas: Normal  Spleen: Normal  Adrenals/Urinary Tract: Adrenal glands are normal. 1.3 cm right upper renal pole cyst. No radiopaque renal, ureteral, or bladder calculus.  Stomach/Bowel: Small hiatal hernia reidentified. Evidence of periumbilical enterocutaneous fistula a reidentified involving adjacent loops of small bowel and containing contrast/ gas level. Overall, this collection measures slightly smaller when the internal cavity component is measured using similar technique as on the prior exam, currently 3.9 x 2.0 cm image 49, previously 5.6 x 2.3 cm at a similar anatomic level.  Colonic diverticuli noted without evidence for diverticulitis.  Vascular/Lymphatic: A small amount of stranding/haziness within the root of small bowel mesentery is stable. Moderate atheromatous aortic calcification without  aneurysm.  No lymphadenopathy.  Other: No free air  Musculoskeletal: No acute osseous abnormality. Lumbar spine disc degenerative change.  IMPRESSION: Slight decrease in size of cavitary component of periumbilical enterocutaneous fistula.   Electronically Signed By: Conchita Paris M.D. On: 04/14/2015 10:47 Problem List/Past Medical  ENTEROCUTANEOUS FISTULA (569.81  K63.2) ABSCESS, ABDOMEN (567.22  K65.1) Change dressing as needed. Have your home health nurse place an Eakin's pouch, cut to the appropriate size to cover both fistula sites.  Other Problems Cholelithiasis Congestive Heart Failure Diverticulosis Back Pain Gastroesophageal Reflux Disease Transfusion history Sleep Apnea High blood pressure Hypercholesterolemia Myocardial infarction  Diagnostic Studies History  Colonoscopy within last year Mammogram 1-3 years ago  Allergies  No Known Drug Allergies 04/06/2015  Medication History  AmLODIPine Besylate (10MG  Tablet, Oral) Active. Atorvastatin Calcium (40MG  Tablet, Oral) Active. Atorvastatin Calcium (20MG  Tablet, Oral) Active. Furosemide (20MG  Tablet, Oral) Active. Nitrostat (0.4MG  Tab Sublingual, Sublingual) Active. Metoprolol Tartrate (25MG  Tablet, Oral) Active. Voltaren (1% Gel, Transdermal) Active. Pantoprazole Sodium (40MG  Tablet DR, Oral) Active. Colchicine (0.6MG  Tablet, Oral) Active. Benicar (20MG  Tablet, Oral) Active. Medications Reconciled  Social History Tobacco use Never smoker. Caffeine use Carbonated beverages, Tea. No alcohol use No drug use  Family History  Diabetes Mellitus Sister. Heart Disease Mother. Hypertension Mother. Arthritis Mother. Anesthetic complications Mother.  Pregnancy / Birth History  Irregular periods Maternal age 23-20 Para 63 Age at menarche 63 years. Age of menopause 26-55 Gravida 6    Vitals   Weight: 216 lb Height: 64in Body Surface Area: 2.1 m  Body Mass Index: 37.08 kg/m Temp.: 83F(Temporal)  Pulse: 81 (Regular)  BP: 130/74 (Sitting, Left Arm, Standard)     Physical Exam   The physical exam findings are as follows: Note:Elderly female in NAD HEENT: EOMI, sclera anicteric Neck: No masses, no thyromegaly Lungs: CTA bilaterally; normal respiratory effort CV: Regular rate and rhythm; no murmurs Abd: +bowel sounds, soft,non-tender; healing midline wound with two small opening. Purulent, but non-feculent drainage from each hole. The openings are probed with cotton swabs and they seem to connect. Ext: Well-perfused; no edema Skin: Warm, dry; no sign of jaundice    Assessment & Plan   ENTEROCUTANEOUS FISTULA (569.81  K63.2)  Current Plans Based on these findings I think the patient needs to have her wound opened widely to allow for easier dressing changes and wound management. If we see any infected mesh we will try to excise locally. I do not think that she would tolerate or benefit from any type of extnesive exploratory laparotomy but she should be able to tolerate a smaller procedure to manage her wound. Would recommend doing this as an inpatient as she will likely need several consultants to help comanage her comorbidities.  Instructions: Will arrange direct admission to [facility] on 04/27/15 for medical management and surgical debridement of abdominal wound.  Imogene Burn. Georgette Dover, MD, [facility]  General/ Trauma Surgery  04/27/2015   Will consult WOCN to help with wound care management. Also, consult [facility] to help with management of chronic medical issues.  Imogene Burn. Georgette Dover, MD, [facility]  General/ Trauma Surgery  04/27/2015 11:49 AM

## 2015-04-27 NOTE — Progress Notes (Signed)
R DL PICC found out to 9cm mark/good blood return/flushed 10cc NS x2. Floor RN at bedside aware, to page MD for STAT PCXR to confirm placement.

## 2015-04-27 NOTE — Consult Note (Addendum)
WOC wound consult note Reason for Consult: Consult requested for abd wounds.  Dr Georgette Dover at bedside to assess. Wound type: 2 full thickness psudostomas to midline abd with fistula according to previous scan. Dressing procedure/placement/frequency: CCS prefers to continue dry dressings at this time and surgery is pending. There is a minimal amt tan drainage and an Eakin pouch is not indicated at this time. Please re-consult if further assistance is needed.  Thank-you,  Julien Girt MSN, [place], [place], [place], [place]

## 2015-04-27 NOTE — Progress Notes (Signed)
Patient refusing CPAP for tonight. RT made patient aware that if she changed her mind to please call.

## 2015-04-28 ENCOUNTER — Inpatient Hospital Stay (HOSPITAL_COMMUNITY): Payer: Medicare Other | Admitting: Certified Registered"

## 2015-04-28 ENCOUNTER — Encounter (HOSPITAL_COMMUNITY): Payer: Self-pay | Admitting: Certified Registered"

## 2015-04-28 ENCOUNTER — Encounter (HOSPITAL_COMMUNITY): Admission: AD | Disposition: A | Discharge status: Home Health | Payer: Self-pay | Source: Ambulatory Visit

## 2015-04-28 DIAGNOSIS — E119 Type 2 diabetes mellitus without complications: Secondary | ICD-10-CM | POA: Insufficient documentation

## 2015-04-28 DIAGNOSIS — D509 Iron deficiency anemia, unspecified: Secondary | ICD-10-CM

## 2015-04-28 DIAGNOSIS — G4733 Obstructive sleep apnea (adult) (pediatric): Secondary | ICD-10-CM

## 2015-04-28 HISTORY — PX: LAPAROTOMY: SHX154

## 2015-04-28 LAB — BASIC METABOLIC PANEL
ANION GAP: 10 (ref 5–15)
BUN: 28 mg/dL — ABNORMAL HIGH (ref 6–20)
CALCIUM: 9.2 mg/dL (ref 8.9–10.3)
CHLORIDE: 104 mmol/L (ref 101–111)
CO2: 21 mmol/L — AB (ref 22–32)
CREATININE: 1.58 mg/dL — AB (ref 0.44–1.00)
GFR calc non Af Amer: 31 mL/min — ABNORMAL LOW (ref >60)
GFR, EST AFRICAN AMERICAN: 36 mL/min — AB (ref >60)
Glucose, Bld: 196 mg/dL — ABNORMAL HIGH (ref 65–99)
Potassium: 4.8 mmol/L (ref 3.5–5.1)
SODIUM: 135 mmol/L (ref 135–145)

## 2015-04-28 LAB — GLUCOSE, CAPILLARY
Glucose-Capillary: 99 mg/dL (ref 65–99)
Glucose-Capillary: 122 mg/dL — ABNORMAL HIGH (ref 65–99)
Glucose-Capillary: 119 mg/dL — ABNORMAL HIGH (ref 65–99)
Glucose-Capillary: 154 mg/dL — ABNORMAL HIGH (ref 65–99)

## 2015-04-28 LAB — CBC
HCT: 30.1 % — ABNORMAL LOW (ref 36.0–46.0)
HEMOGLOBIN: 9.2 g/dL — AB (ref 12.0–15.0)
MCH: 22.7 pg — AB (ref 26.0–34.0)
MCHC: 30.6 g/dL (ref 30.0–36.0)
MCV: 74.1 fL — ABNORMAL LOW (ref 78.0–100.0)
PLATELETS: 196 10*3/uL (ref 150–400)
RBC: 4.06 MIL/uL (ref 3.87–5.11)
RDW: 16.2 % — ABNORMAL HIGH (ref 11.5–15.5)
WBC: 8.1 10*3/uL (ref 4.0–10.5)

## 2015-04-28 LAB — FOLATE: Folate: 18.6 ng/mL (ref >5.9)

## 2015-04-28 LAB — FERRITIN: Ferritin: 764 ng/mL — ABNORMAL HIGH (ref 11–307)

## 2015-04-28 LAB — RETICULOCYTES
RBC.: 4.06 MIL/uL (ref 3.87–5.11)
RETIC CT PCT: 1.8 % (ref 0.4–3.1)
Retic Count, Absolute: 73.1 10*3/uL (ref 19.0–186.0)

## 2015-04-28 LAB — IRON AND TIBC
IRON: 25 ug/dL — AB (ref 28–170)
SATURATION RATIOS: 11 % (ref 10.4–31.8)
TIBC: 234 ug/dL — AB (ref 250–450)
UIBC: 209 ug/dL

## 2015-04-28 LAB — VITAMIN B12: VITAMIN B 12: 3369 pg/mL — AB (ref 180–914)

## 2015-04-28 SURGERY — LAPAROTOMY, EXPLORATORY
Anesthesia: General | Site: Abdomen

## 2015-04-28 MED ORDER — GLYCOPYRROLATE 0.2 MG/ML IJ SOLN
INTRAMUSCULAR | Status: AC
  Filled 2015-04-28: qty 3

## 2015-04-28 MED ORDER — ROCURONIUM BROMIDE 100 MG/10ML IV SOLN
INTRAVENOUS | Status: DC | PRN
  Administered 2015-04-28: 20 mg via INTRAVENOUS

## 2015-04-28 MED ORDER — LACTATED RINGERS IV SOLN
INTRAVENOUS | Status: DC
  Administered 2015-04-28: 09:00:00 via INTRAVENOUS

## 2015-04-28 MED ORDER — FENTANYL CITRATE (PF) 250 MCG/5ML IJ SOLN
INTRAMUSCULAR | Status: DC | PRN
  Administered 2015-04-28: 100 ug via INTRAVENOUS

## 2015-04-28 MED ORDER — FENTANYL CITRATE (PF) 250 MCG/5ML IJ SOLN
INTRAMUSCULAR | Status: AC
  Filled 2015-04-28: qty 5

## 2015-04-28 MED ORDER — TRACE MINERALS CR-CU-MN-SE-ZN 10-1000-500-60 MCG/ML IV SOLN
INTRAVENOUS | Status: AC
  Administered 2015-04-28: 18:00:00 via INTRAVENOUS
  Filled 2015-04-28: qty 1680

## 2015-04-28 MED ORDER — SODIUM CHLORIDE 0.9 % IV SOLN
INTRAVENOUS | Status: DC
  Administered 2015-04-28 – 2015-04-30 (×2): via INTRAVENOUS

## 2015-04-28 MED ORDER — SUCCINYLCHOLINE CHLORIDE 20 MG/ML IJ SOLN
INTRAMUSCULAR | Status: DC | PRN
  Administered 2015-04-28: 100 mg via INTRAVENOUS

## 2015-04-28 MED ORDER — PROPOFOL 10 MG/ML IV BOLUS
INTRAVENOUS | Status: DC | PRN
  Administered 2015-04-28: 150 mg via INTRAVENOUS
  Administered 2015-04-28: 50 mg via INTRAVENOUS

## 2015-04-28 MED ORDER — DEXAMETHASONE SODIUM PHOSPHATE 4 MG/ML IJ SOLN
INTRAMUSCULAR | Status: DC | PRN
  Administered 2015-04-28: 8 mg via INTRAVENOUS

## 2015-04-28 MED ORDER — FAT EMULSION 20 % IV EMUL
240.0000 mL | INTRAVENOUS | Status: AC
  Administered 2015-04-28: 240 mL via INTRAVENOUS
  Filled 2015-04-28: qty 250

## 2015-04-28 MED ORDER — HYDROMORPHONE HCL 1 MG/ML IJ SOLN
INTRAMUSCULAR | Status: AC
  Filled 2015-04-28: qty 1

## 2015-04-28 MED ORDER — ONDANSETRON HCL 4 MG/2ML IJ SOLN
INTRAMUSCULAR | Status: AC
  Filled 2015-04-28: qty 2

## 2015-04-28 MED ORDER — NEOSTIGMINE METHYLSULFATE 10 MG/10ML IV SOLN
INTRAVENOUS | Status: AC
  Filled 2015-04-28: qty 1

## 2015-04-28 MED ORDER — ONDANSETRON HCL 4 MG/2ML IJ SOLN
INTRAMUSCULAR | Status: DC | PRN
  Administered 2015-04-28: 4 mg via INTRAVENOUS

## 2015-04-28 MED ORDER — ONDANSETRON HCL 4 MG/2ML IJ SOLN: 4.0000 mg | Freq: Once | INTRAMUSCULAR | Status: DC | PRN

## 2015-04-28 MED ORDER — INSULIN ASPART 100 UNIT/ML ~~LOC~~ SOLN
0.0000 [IU] | Freq: Four times a day (QID) | SUBCUTANEOUS | Status: DC
  Administered 2015-04-29: 1 [IU] via SUBCUTANEOUS
  Administered 2015-04-29: 2 [IU] via SUBCUTANEOUS
  Administered 2015-04-30: 1 [IU] via SUBCUTANEOUS

## 2015-04-28 MED ORDER — HYDROMORPHONE HCL 1 MG/ML IJ SOLN
0.2500 mg | INTRAMUSCULAR | Status: DC | PRN
  Administered 2015-04-28: 0.5 mg via INTRAVENOUS
  Administered 2015-04-28 (×2): 0.25 mg via INTRAVENOUS

## 2015-04-28 MED ORDER — NEOSTIGMINE METHYLSULFATE 10 MG/10ML IV SOLN
INTRAVENOUS | Status: DC | PRN
  Administered 2015-04-28: 4 mg via INTRAVENOUS

## 2015-04-28 MED ORDER — DEXTROSE 5 % IV SOLN
1.0000 g | INTRAVENOUS | Status: DC | PRN
  Administered 2015-04-28: 1 g via INTRAVENOUS

## 2015-04-28 MED ORDER — LIDOCAINE HCL (CARDIAC) 20 MG/ML IV SOLN
INTRAVENOUS | Status: DC | PRN
  Administered 2015-04-28: 20 mg via INTRAVENOUS

## 2015-04-28 MED ORDER — 0.9 % SODIUM CHLORIDE (POUR BTL) OPTIME
TOPICAL | Status: DC | PRN
  Administered 2015-04-28: 1000 mL

## 2015-04-28 MED ORDER — GLYCOPYRROLATE 0.2 MG/ML IJ SOLN
INTRAMUSCULAR | Status: DC | PRN
  Administered 2015-04-28: .6 mg via INTRAVENOUS

## 2015-04-28 MED ORDER — MORPHINE SULFATE (PF) 2 MG/ML IV SOLN
2.0000 mg | INTRAVENOUS | Status: DC | PRN
  Administered 2015-04-28 – 2015-05-01 (×6): 2 mg via INTRAVENOUS
  Administered 2015-05-01 – 2015-05-03 (×5): 4 mg via INTRAVENOUS
  Administered 2015-05-16 – 2015-05-17 (×3): 2 mg via INTRAVENOUS
  Administered 2015-05-18: 4 mg via INTRAVENOUS
  Filled 2015-04-28 (×2): qty 2
  Filled 2015-04-28 (×2): qty 1
  Filled 2015-04-28: qty 2
  Filled 2015-04-28: qty 1
  Filled 2015-04-28: qty 2
  Filled 2015-04-28 (×2): qty 1
  Filled 2015-04-28: qty 2
  Filled 2015-04-28: qty 1
  Filled 2015-04-28: qty 2
  Filled 2015-04-28 (×3): qty 1
  Filled 2015-04-28: qty 2
  Filled 2015-04-28: qty 1

## 2015-04-28 SURGICAL SUPPLY — 55 items
APPLICATOR COTTON TIP 6IN STRL (MISCELLANEOUS) ×2 IMPLANT
BLADE SURG ROTATE 9660 (MISCELLANEOUS) IMPLANT
CANISTER SUCTION 2500CC (MISCELLANEOUS) ×3 IMPLANT
CHLORAPREP W/TINT 26ML (MISCELLANEOUS) ×1 IMPLANT
COVER MAYO STAND STRL (DRAPES) IMPLANT
COVER SURGICAL LIGHT HANDLE (MISCELLANEOUS) ×3 IMPLANT
DRAIN PENROSE 1/2X12 LTX STRL (WOUND CARE) ×2 IMPLANT
DRAPE LAPAROSCOPIC ABDOMINAL (DRAPES) ×3 IMPLANT
DRAPE PROXIMA HALF (DRAPES) IMPLANT
DRAPE UTILITY XL STRL (DRAPES) ×6 IMPLANT
DRAPE WARM FLUID 44X44 (DRAPE) ×3 IMPLANT
DRSG OPSITE POSTOP 4X10 (GAUZE/BANDAGES/DRESSINGS) IMPLANT
DRSG OPSITE POSTOP 4X8 (GAUZE/BANDAGES/DRESSINGS) IMPLANT
ELECT BLADE 6.5 EXT (BLADE) IMPLANT
ELECT CAUTERY BLADE 6.4 (BLADE) ×4 IMPLANT
ELECT REM PT RETURN 9FT ADLT (ELECTROSURGICAL) ×3
ELECTRODE REM PT RTRN 9FT ADLT (ELECTROSURGICAL) ×1 IMPLANT
GAUZE PACKING FOLDED 2  STR (GAUZE/BANDAGES/DRESSINGS) ×2
GAUZE PACKING FOLDED 2 STR (GAUZE/BANDAGES/DRESSINGS) IMPLANT
GAUZE SPONGE 4X4 12PLY STRL (GAUZE/BANDAGES/DRESSINGS) ×2 IMPLANT
GLOVE BIO SURGEON STRL SZ7 (GLOVE) ×3 IMPLANT
GLOVE BIOGEL PI IND STRL 7.0 (GLOVE) IMPLANT
GLOVE BIOGEL PI IND STRL 7.5 (GLOVE) ×1 IMPLANT
GLOVE BIOGEL PI INDICATOR 7.0 (GLOVE) ×2
GLOVE BIOGEL PI INDICATOR 7.5 (GLOVE) ×2
GLOVE SURG SS PI 7.0 STRL IVOR (GLOVE) ×4 IMPLANT
GOWN STRL REUS W/ TWL LRG LVL3 (GOWN DISPOSABLE) ×3 IMPLANT
GOWN STRL REUS W/TWL LRG LVL3 (GOWN DISPOSABLE) ×6
KIT BASIN OR (CUSTOM PROCEDURE TRAY) ×3 IMPLANT
KIT ROOM TURNOVER OR (KITS) ×3 IMPLANT
LIGASURE IMPACT 36 18CM CVD LR (INSTRUMENTS) IMPLANT
NS IRRIG 1000ML POUR BTL (IV SOLUTION) ×4 IMPLANT
PACK GENERAL/GYN (CUSTOM PROCEDURE TRAY) ×3 IMPLANT
PAD ABD 8X10 STRL (GAUZE/BANDAGES/DRESSINGS) ×2 IMPLANT
PAD ARMBOARD 7.5X6 YLW CONV (MISCELLANEOUS) ×3 IMPLANT
PENCIL BUTTON HOLSTER BLD 10FT (ELECTRODE) ×2 IMPLANT
SOL PREP POV-IOD 4OZ 10% (MISCELLANEOUS) ×2 IMPLANT
SPECIMEN JAR LARGE (MISCELLANEOUS) IMPLANT
SPONGE INTESTINAL PEANUT (DISPOSABLE) ×2 IMPLANT
SPONGE LAP 18X18 X RAY DECT (DISPOSABLE) ×4 IMPLANT
STAPLER VISISTAT 35W (STAPLE) ×1 IMPLANT
SUCTION POOLE TIP (SUCTIONS) ×1 IMPLANT
SUT ETHILON 2 0 FS 18 (SUTURE) ×2 IMPLANT
SUT PDS AB 0 CT 36 (SUTURE) ×2 IMPLANT
SUT PDS AB 1 TP1 96 (SUTURE) ×2 IMPLANT
SUT SILK 2 0 SH CR/8 (SUTURE) ×3 IMPLANT
SUT SILK 2 0 TIES 10X30 (SUTURE) ×3 IMPLANT
SUT SILK 3 0 SH CR/8 (SUTURE) ×1 IMPLANT
SUT SILK 3 0 TIES 10X30 (SUTURE) ×1 IMPLANT
SUT VIC AB 3-0 SH 18 (SUTURE) IMPLANT
TOWEL OR 17X26 10 PK STRL BLUE (TOWEL DISPOSABLE) ×3 IMPLANT
TRAY FOLEY CATH 16FRSI W/METER (SET/KITS/TRAYS/PACK) IMPLANT
TUBE CONNECTING 12'X1/4 (SUCTIONS) ×1
TUBE CONNECTING 12X1/4 (SUCTIONS) ×1 IMPLANT
YANKAUER SUCT BULB TIP NO VENT (SUCTIONS) ×2 IMPLANT

## 2015-04-28 NOTE — Procedures (Signed)
Pt refuses CPAP for the night.  She states that the machine "drives me crazy." RT will continue to monitor pt and will place if needed or requested.

## 2015-04-28 NOTE — Progress Notes (Signed)
Utilization review completed. Dewon Mendizabal, RN, BSN. 

## 2015-04-28 NOTE — Progress Notes (Signed)
Report to Ileene Rubens Rn to monitor pt while I transport and then for lunch relief.

## 2015-04-28 NOTE — Progress Notes (Signed)
Placed patient on CPAP at 6cm  

## 2015-04-28 NOTE — Progress Notes (Signed)
PARENTERAL NUTRITION CONSULT NOTE - FOLLOW UP  Pharmacy Consult for TPN Indication: EC fistula  No Known Allergies  Patient Measurements:    Weight 99.3 kg   Vital Signs: Temp: 98.2 F (36.8 C) (08/16 1246) Temp Source: Oral (08/16 1246) BP: 122/65 mmHg (08/16 1246) Pulse Rate: 79 (08/16 1246) Intake/Output from previous day: 08/15 0701 - 08/16 0700 In: 30 [I.V.:30] Out: -  Intake/Output from this shift: Total I/O In: 500 [I.V.:500] Out: -   Labs:  Recent Labs  04/27/15 1608  WBC 6.5  HGB 8.2*  HCT 27.2*  PLT 244  INR 1.13     Recent Labs  04/27/15 1608  NA 137  K 4.0  CL 104  CO2 26  GLUCOSE 87  BUN 32*  CREATININE 1.48*  CALCIUM 9.3  PROT 7.4  ALBUMIN 2.9*  AST 21  ALT 16  ALKPHOS 78  BILITOT 0.3   Estimated Creatinine Clearance: 38.2 mL/min (by C-G formula based on Cr of 1.48).    Recent Labs  04/27/15 1647 04/27/15 2155 04/28/15 0752  GLUCAP 77 70 99    Medications:  Infusions:  . sodium chloride 100 mL/hr at 04/27/15 1836  . 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, 85g protein  Assessment:  Admit:  Surgeries/Procedures: s/p abdominal wound exploration with removal of infected abdominal mesh 8/16 GI: EC fistula, abdominal wound Endo: glucose 87, no insulin in TPN Lytes: K 4, she tolerates electrolytes in her TPN Renal: SCr 1.48, baseline 1.2 Pulm: OSA Cards: VSS, metoprolol Hepatobil: LFTs ok Neuro: ok ID: no antibiotics Best Practices: lovenox, PPI IV TPN Access: PICC TPN start date: chronic  Nutritional Goals:  Will target providing her chronic TPN provisions (~1600 kcal, 85g protein) pending further RD assessment   Plan:  Start Clinimix E 5/15 at 70 ml/hr + 20% IVFE at 10 ml/hr. This will provide 1672 kcal and 84g protein. Follow for tolerance. Will change to cyclic on 0/24 if she  tolerates. Check CBGs q6h TPN labs in AM and qMon/Thurs Decrease NS to 20 ml/hr when TPN starts.  Will continue LR for now - will need further input from CCS regarding her maintenance fluid requirements.   Legrand Como, Pharm.D., BCPS, AAHIVP Clinical Pharmacist Phone: [phone] or [phone] 04/28/2015, 1:19 PM

## 2015-04-28 NOTE — Progress Notes (Signed)
Day of Surgery  Subjective: Patient evaluated by [facility] yesterday OK for surgery today   Objective: Vital signs in last 24 hours: Temp:  [97.7 F (36.5 C)-98.6 F (37 C)] 98.3 F (36.8 C) (08/16 0534) Pulse Rate:  [77-85] 78 (08/16 0534) Resp:  [16-19] 18 (08/16 0534) BP: (93-123)/(43-67) 106/48 mmHg (08/16 0534) SpO2:  [99 %-100 %] 100 % (08/16 0534)    Intake/Output from previous day: 08/15 0701 - 08/16 0700 In: 30 [I.V.:30] Out: -  Intake/Output this shift:    General appearance: No distress; abdominal wound - two 1 cm areas of granulation tissue with purulent drainage; fluctuance 3 cm  Lab Results:   Recent Labs  04/27/15 1608  WBC 6.5  HGB 8.2*  HCT 27.2*  PLT 244   BMET  Recent Labs  04/27/15 1608  NA 137  K 4.0  CL 104  CO2 26  GLUCOSE 87  BUN 32*  CREATININE 1.48*  CALCIUM 9.3   PT/INR  Recent Labs  04/27/15 1608  LABPROT 14.6  INR 1.13   ABG No results for input(s): PHART, HCO3 in the last 72 hours.  Invalid input(s): PCO2, PO2  Studies/Results: Dg Chest Port 1 View  04/27/2015   CLINICAL DATA:  Patient for revision of PICC line.  EXAM: PORTABLE CHEST - 1 VIEW  COMPARISON:  Single view of the chest 04/20/2015.  FINDINGS: Right PICC remains in place with the tip projecting at the confluence of the brachiocephalic veins. The lungs are clear. Heart size is normal. No pneumothorax or pleural effusion. Mild asymmetric elevation of the right hemidiaphragm relative to the left is noted.  IMPRESSION: Tip of right PICC projects at the confluence of the brachiocephalic veins. No acute disease.   Electronically Signed   By: Inge Rise M.D.   On: 04/27/2015 11:29    Anti-infectives: Anti-infectives    Start     Dose/Rate Route Frequency Ordered Stop   04/28/15 0000  cefOXitin (MEFOXIN) 1 g in dextrose 5 % 50 mL IVPB     1 g 100 mL/hr over 30 Minutes Intravenous  Once 04/27/15 1157 04/28/15 0859      Assessment/Plan: s/p  Procedure(s): Abdominal Wound Exp. (N/A) Plan to explore wound today - debride infected mesh.   The surgical procedure has been discussed with the patient.  Potential risks, benefits, alternative treatments, and expected outcomes have been explained.  All of the patient's questions at this time have been answered.  The likelihood of reaching the patient's treatment goal is good.  The patient understand the proposed surgical procedure and wishes to proceed.   LOS: 1 day    Sherrica Niehaus K. 04/28/2015

## 2015-04-28 NOTE — Transfer of Care (Signed)
Immediate Anesthesia Transfer of Care Note  Patient: Marissa [name]  Procedure(s) Performed: Procedure(s): Abdominal Wound Explororation, Explantation of Abdominal Mesh (N/A)  Patient Location: PACU  Anesthesia Type:General  Level of Consciousness: awake, alert , oriented and patient cooperative  Airway & Oxygen Therapy: Patient Spontanous Breathing and Patient connected to nasal cannula oxygen  Post-op Assessment: Report given to RN, Post -op Vital signs reviewed and stable and Patient moving all extremities  Post vital signs: Reviewed and stable  Last Vitals:  Filed Vitals:   04/28/15 0534  BP: 106/48  Pulse: 78  Temp: 36.8 C  Resp: 18    Complications: No apparent anesthesia complications

## 2015-04-28 NOTE — Anesthesia Procedure Notes (Signed)
Procedure Name: Intubation Date/Time: 04/28/2015 9:58 AM Performed by: Julian Reil Pre-anesthesia Checklist: Patient identified, Emergency Drugs available, Suction available and Patient being monitored Patient Re-evaluated:Patient Re-evaluated prior to inductionOxygen Delivery Method: Circle system utilized Preoxygenation: Pre-oxygenation with 100% oxygen Intubation Type: IV induction Laryngoscope Size: Mac and 4 Grade View: Grade I Tube type: Oral Tube size: 7.5 mm Number of attempts: 1 Airway Equipment and Method: Stylet Placement Confirmation: ETT inserted through vocal cords under direct vision,  positive ETCO2 and breath sounds checked- equal and bilateral Secured at: 21 cm Tube secured with: Tape Dental Injury: Teeth and Oropharynx as per pre-operative assessment  Comments: LMA inserted after smooth IV induction without resistance. Bilious fluid noted in oropharynx. LMA immediately removed. Anectine given IV and patient intubated. Grade I view. Cords noted to be edematous. ETT passed without difficulty. Deep suctioned ETT and received trace amount of bilious fluid from tube. OGT passed and 50 cc fluid suctioned out of stomach.

## 2015-04-28 NOTE — Op Note (Signed)
Pre-op Diagnosis:  Enterocutaneous fistula Post-op Diagnosis:  Same Procedure:  Abdominal wound exploration/ explantation of infected abdominal mesh Surgeon:  Sereena Marando K. 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 In 2003 she underwent left hemicolectomy with left transverse colon to sigmoid colon anastomosis by Dr. Alphonsa Overall for left colon diverticulitis. He also repaired her umbilical hernia and removed her appendix. In 2009 she underwent repair of incarcerated ventral incisional hernia by Dr. Johnathan Hausen. This was repaired with Ventralex mesh secured with Prolene sutures. In 2013 she underwent an attempted laparoscopic hysterectomy converted to an open total abdominal hysterectomy with bilateral salpingo-oophorectomy. This required extensive enterolysis. The surgeon was Dr. Janie Morning. Several days after that surgery she returned to the hospital with foul-smelling fluid coming from her wound. 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 We managed this conservatively with a VAC dressing. The wound subsequently healed. She was last seen in our office in January 2014 with a completely healed wound. She has had several admissions for GI bleeding. In late June of this year she noticed some swelling over the middle of her midline scar. 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 She was managed with a Eakin's pouch, bowel rest, TPN, and Invanz. Fistula studies were unable to locate the location of her fistula. The fistula output has decreased.  She presents now for wound exploration.  Description of procedure: The patient is brought to the operating room and placed in supine position on the operating room table. After an adequate level of general anesthesia was obtained the patient's abdomen was prepped with Betadine and  draped in sterile fashion. A timeout was taken to ensure the proper patient and proper procedure. She has 2 areas of granulation tissue in her midline. Explored both of these with hemostats.  These both seem to track into a common cavity. I excised the skin in between the 2 areas of granulation tissue. We dissected down into an abscess. I excised all the visible granulation tissue. After examining the CT scan it appeared that we were still fairly superficial to the largest abscess. There are 2 small tunnels that were identified. We follow these down into a larger abscess. We open this for a distance of about 4 cm. We encountered the ventral X mesh that had been placed in 2009. This was obviously infected with a large amount of purulent fluid. I did not see any bile or succus entericus. There is no stool in the wound. We identified all of the Prolene sutures and removed these. The mesh was then dissected free from the fascia and removed entirely. It is difficult to identify the tissue posterior to this area. It could be small bowel or omentum. We irrigated this area thoroughly and observed for several minutes. There is no further purulent drainage or bile draining into this area. I placed a small Penrose drain into this area and secured it with a 2-0 nylon suture. We closed the visible parts of the fascia with interrupted 0 PDS sutures. The wound was left open was packed with gauze. A dry dressing was applied. The patient was then extubated and brought to recovery room in stable condition. All sponge, initially, and needle counts are correct.  Imogene Burn. Georgette Dover, MD, [facility] Surgery  General/ Trauma Surgery  04/28/2015 11:08 AM

## 2015-04-28 NOTE — Anesthesia Preprocedure Evaluation (Signed)
Anesthesia Evaluation  Patient identified by MRN, date of birth, ID band Patient awake    Reviewed: Allergy & Precautions, NPO status , Patient's Chart, lab work & pertinent test results  Airway Mallampati: II  TM Distance: >3 FB     Dental  (+) Teeth Intact, Dental Advisory Given   Pulmonary  breath sounds clear to auscultation        Cardiovascular hypertension, Rhythm:Regular Rate:Normal     Neuro/Psych    GI/Hepatic   Endo/Other  diabetes  Renal/GU      Musculoskeletal   Abdominal   Peds  Hematology   Anesthesia Other Findings   Reproductive/Obstetrics                             Anesthesia Physical Anesthesia Plan  ASA: III  Anesthesia Plan: General   Post-op Pain Management:    Induction: Intravenous  Airway Management Planned:   Additional Equipment:   Intra-op Plan:   Post-operative Plan:   Informed Consent: I have reviewed the patients History and Physical, chart, labs and discussed the procedure including the risks, benefits and alternatives for the proposed anesthesia with the patient or authorized representative who has indicated his/her understanding and acceptance.   Dental advisory given  Plan Discussed with: CRNA and Anesthesiologist  Anesthesia Plan Comments:         Anesthesia Quick Evaluation

## 2015-04-28 NOTE — Anesthesia Postprocedure Evaluation (Signed)
  Anesthesia Post-op Note  Patient: Marissa [name]  Procedure(s) Performed: Procedure(s): Abdominal Wound Explororation, Explantation of Abdominal Mesh (N/A)  Patient Location: PACU  Anesthesia Type:General  Level of Consciousness: awake, alert  and oriented  Airway and Oxygen Therapy: Patient Spontanous Breathing and Patient connected to nasal cannula oxygen  Post-op Pain: mild  Post-op Assessment: Post-op Vital signs reviewed, Patient's Cardiovascular Status Stable, Respiratory Function Stable, Patent Airway and Pain level controlled              Post-op Vital Signs: stable  Last Vitals:  Filed Vitals:   04/28/15 1432  BP: 107/63  Pulse: 74  Temp: 36.6 C  Resp: 14    Complications: No apparent anesthesia complications

## 2015-04-28 NOTE — Progress Notes (Signed)
[facility] PROGRESS NOTE/Consult  Marissa [name] HEN:[id number] DOB: May 16, 1941 DOA: 04/27/2015 PCP: [name], MD  Assessment/Plan: #1 enterocutaneous fistula Status post abdominal wound exploration/explantation of infected abdominal mesh per Dr.Tsuei 04/28/2015  #2 hypertension Blood pressure is borderline. Will hold antihypertensives at this time. Monitor. A blood pressure status a right to resume home antihypertensives medications.  #3 chronic kidney disease stage III Stable.  #4 chronic diastolic CHF Compensated. Monitor closely for volume overload. Will hold antihypertensives medications for now secondary to borderline blood pressure.  #5 obstructive sleep apnea C Pap daily at bedtime.  #6 gastroesophageal reflux disease PPI.  #7 well-controlled type 2 diabetes mellitus diet-controlled Hemoglobin A1c was 5.7 on 03/14/2015. CBGs have ranged from 99- 122. Continue sliding scale insulin.  #8 history of DVT Not anticoagulation candidate secondary to recurrent diverticular gastric bleeding.  #9 iron deficiency anemia H&H stable. Continue iron supplementation.  #10 coronary artery disease Stable. Continue beta blocker.  #11 prophylaxis PPI for GI prophylaxis. Lovenox for DVT prophylaxis.  Code Status: Full Family Communication: Updated patient. No family at bedside. Disposition Plan: Per primary team.   Consultants:  [facility]:Dr Hongalgi 04/27/2015  Procedures:  Chest x-ray 04/27/2015  Abdominal wound exploration/explantation of infected abdominal mesh per Dr. Georgette Dover 04/28/2015  Antibiotics:  None  HPI/Subjective: Patient is postop. Patient's opens eyes to verbal stimuli following commands. Patient denies any chest pain. Patient denies any shortness of breath. Patient complaining of diffuse abdominal discomfort.  Objective: Filed Vitals:   04/28/15 1432  BP: 107/63  Pulse: 74  Temp: 97.8 F (36.6 C)  Resp: 14    Intake/Output  Summary (Last 24 hours) at 04/28/15 2040 Last data filed at 04/28/15 2024  Gross per 24 hour  Intake    870 ml  Output    700 ml  Net    170 ml   There were no vitals filed for this visit.  Exam:   General:  NAD  Cardiovascular: RRR  Respiratory: CTAB anterior lung fields  Abdomen: Soft, hypoactive bowel sounds, bandage over incision site, diffuse tenderness to palpation  Musculoskeletal: No clubbing cyanosis or edema.  Data Reviewed: Basic Metabolic Panel:  Recent Labs Lab 04/27/15 1608 04/28/15 1915  NA 137 135  K 4.0 4.8  CL 104 104  CO2 26 21*  GLUCOSE 87 196*  BUN 32* 28*  CREATININE 1.48* 1.58*  CALCIUM 9.3 9.2   Liver Function Tests:  Recent Labs Lab 04/27/15 1608  AST 21  ALT 16  ALKPHOS 78  BILITOT 0.3  PROT 7.4  ALBUMIN 2.9*   No results for input(s): LIPASE, AMYLASE in the last 168 hours. No results for input(s): AMMONIA in the last 168 hours. CBC:  Recent Labs Lab 04/27/15 1608 04/28/15 1915  WBC 6.5 8.1  HGB 8.2* 9.2*  HCT 27.2* 30.1*  MCV 72.3* 74.1*  PLT 244 196   Cardiac Enzymes: No results for input(s): CKTOTAL, CKMB, CKMBINDEX, TROPONINI in the last 168 hours. BNP (last 3 results)  Recent Labs  11/02/14 2340 03/19/15 1325  BNP 109.2* 141.6*    ProBNP (last 3 results) No results for input(s): PROBNP in the last 8760 hours.  CBG:  Recent Labs Lab 04/27/15 1647 04/27/15 2155 04/28/15 0752 04/28/15 1112 04/28/15 1626  GLUCAP 77 70 99 122* 119*    Recent Results (from the past 240 hour(s))  Surgical pcr screen     Status: None   Collection Time: 04/27/15  7:41 PM  Result Value Ref Range Status   MRSA,  PCR NEGATIVE NEGATIVE Final   Staphylococcus aureus NEGATIVE NEGATIVE Final    Comment:        The Xpert SA Assay (FDA approved for NASAL specimens in patients over 34 years of age), is one component of a comprehensive surveillance program.  Test performance has been validated by [facility] for patients  greater than or equal to 55 year old. It is not intended to diagnose infection nor to guide or monitor treatment.      Studies: Dg Chest Port 1 View  04/27/2015   CLINICAL DATA:  Patient for revision of PICC line.  EXAM: PORTABLE CHEST - 1 VIEW  COMPARISON:  Single view of the chest 04/20/2015.  FINDINGS: Right PICC remains in place with the tip projecting at the confluence of the brachiocephalic veins. The lungs are clear. Heart size is normal. No pneumothorax or pleural effusion. Mild asymmetric elevation of the right hemidiaphragm relative to the left is noted.  IMPRESSION: Tip of right PICC projects at the confluence of the brachiocephalic veins. No acute disease.   Electronically Signed   By: Inge Rise M.D.   On: 04/27/2015 11:29    Scheduled Meds: . amLODipine  10 mg Oral Daily  . Chlorhexidine Gluconate Cloth  6 each Topical Q0600  . enoxaparin (LOVENOX) injection  30 mg Subcutaneous Q24H  . feeding supplement  1 Container Oral TID BM  . HYDROmorphone      . insulin aspart  0-9 Units Subcutaneous 4 times per day  . irbesartan  150 mg Oral Daily  . iron polysaccharides  150 mg Oral Daily  . metoprolol tartrate  25 mg Oral q morning - 10a  . mupirocin ointment   Nasal BID  . pantoprazole (PROTONIX) IV  40 mg Intravenous QHS  . cyanocobalamin  2,000 mcg Oral Daily   Continuous Infusions: . sodium chloride 20 mL/hr at 04/28/15 1855  . Marland KitchenTPN (CLINIMIX-E) Adult 70 mL/hr at 04/28/15 1741   And  . fat emulsion 240 mL (04/28/15 1742)  . lactated ringers 50 mL/hr at 04/28/15 2831    Principal Problem:   Enterocutaneous fistula Active Problems:   OSA (obstructive sleep apnea)   Essential hypertension   CAD S/P OM BMS 8/07, ISR-OM DES 8/09   GERD   Chronic kidney disease, stage 3   DVT, HX OF   Hyperlipidemia with target LDL less than 100   Anemia, iron deficiency    Time spent: 72 minutes    Marissa [name] M.D. [facility] Pager [phone]. If 7PM-7AM,  please contact night-coverage at [email or web address], password [facility] 04/28/2015, 8:40 PM  LOS: 1 day

## 2015-04-28 NOTE — OR Nursing (Signed)
Glucometer unable to download PACU admitting glucose. Glucose was 122 on admit to PACU.

## 2015-04-28 NOTE — Progress Notes (Addendum)
Initial Nutrition Assessment  DOCUMENTATION CODES:   Obesity unspecified  INTERVENTION:   -TPN management per pharmacy   NUTRITION DIAGNOSIS:   Inadequate oral intake related to inability to eat as evidenced by NPO status.  GOAL:   Patient will meet greater than or equal to 90% of their needs  MONITOR:   Diet advancement, PO intake, Labs, Weight trends, Skin, I & O's  REASON FOR ASSESSMENT:   Malnutrition Screening Tool, Consult New TPN/TNA  ASSESSMENT:   The patient is a 74 year old female presenting to discuss diagnostic procedure results. The patient is a 74 year old female who presents with a subcutaneous abscess and a very complex past surgical history.   Pt admitted with enterocutaneous fistula.   Pt down in OR at time of visit to undergo abdominal wound exploration/ explantation of infected abdominal mesh. Unable to complete Nutrition-Focused physical exam at this time.   Pt on chronic TPN at home (followed by [place]), which provides 1605 kcal, 85g protein (94% of estimated kcal needs and 85% of protein needs).   Per pharmacy note, to start Clinimix E 5/15 at 70 ml/hr + 20% IVFE at 10 ml/hr at 1800 (which provides 1672 kcal and 84g protein, which meets 98% of estimated kcal needs and 84% of estimated protein needs). Plan is to transition to cyclic TPN on 03/03/62 if pt able to tolerate.   Addendum: Pt has been advanced to a clear liquid diet. Refusing [place] supplements currently.   Diet Order:  Diet clear liquid Room service appropriate?: Yes; Fluid consistency:: Thin TPN (CLINIMIX-E) Adult  Skin:  Wound (see comment) (2 openings at abdominal fistula)  Last BM:  PTA  Height:   Ht Readings from Last 1 Encounters:  04/15/15 5\' 4"  (1.626 m)    Weight:   Wt Readings from Last 1 Encounters:  04/15/15 219 lb (99.338 kg)    Ideal Body Weight:  54.5 kg  BMI:  Estimated body mass index is 37.57 kg/(m^2) as calculated from the  following:   Height as of 04/15/15: 5\' 4"  (1.626 m).   Weight as of 04/15/15: 219 lb (99.338 kg).  Estimated Nutritional Needs:   Kcal:  1700-1900  Protein:  100-110 grams  Fluid:  1.7-1.9 L  EDUCATION NEEDS:   No education needs identified at this time  Maymuna Detzel A. Jimmye Norman, RD, LDN, CDE Pager: [phone] After hours Pager: [phone]

## 2015-04-29 ENCOUNTER — Encounter (HOSPITAL_COMMUNITY): Payer: Self-pay | Admitting: Surgery

## 2015-04-29 DIAGNOSIS — K219 Gastro-esophageal reflux disease without esophagitis: Secondary | ICD-10-CM

## 2015-04-29 DIAGNOSIS — E785 Hyperlipidemia, unspecified: Secondary | ICD-10-CM

## 2015-04-29 LAB — COMPREHENSIVE METABOLIC PANEL
ALBUMIN: 2.5 g/dL — AB (ref 3.5–5.0)
ALK PHOS: 94 U/L (ref 38–126)
ALT: 40 U/L (ref 14–54)
AST: 37 U/L (ref 15–41)
Anion gap: 6 (ref 5–15)
BILIRUBIN TOTAL: 0.5 mg/dL (ref 0.3–1.2)
BUN: 27 mg/dL — AB (ref 6–20)
CALCIUM: 8.7 mg/dL — AB (ref 8.9–10.3)
CO2: 23 mmol/L (ref 22–32)
CREATININE: 1.41 mg/dL — AB (ref 0.44–1.00)
Chloride: 103 mmol/L (ref 101–111)
GFR calc Af Amer: 41 mL/min — ABNORMAL LOW (ref >60)
GFR, EST NON AFRICAN AMERICAN: 36 mL/min — AB (ref >60)
GLUCOSE: 147 mg/dL — AB (ref 65–99)
POTASSIUM: 4.5 mmol/L (ref 3.5–5.1)
Sodium: 132 mmol/L — ABNORMAL LOW (ref 135–145)
TOTAL PROTEIN: 7.1 g/dL (ref 6.5–8.1)

## 2015-04-29 LAB — DIFFERENTIAL
BASOS PCT: 0 % (ref 0–1)
Basophils Absolute: 0 10*3/uL (ref 0.0–0.1)
EOS PCT: 0 % (ref 0–5)
Eosinophils Absolute: 0 10*3/uL (ref 0.0–0.7)
Lymphocytes Relative: 7 % — ABNORMAL LOW (ref 12–46)
Lymphs Abs: 0.5 10*3/uL — ABNORMAL LOW (ref 0.7–4.0)
MONO ABS: 0.6 10*3/uL (ref 0.1–1.0)
Monocytes Relative: 9 % (ref 3–12)
NEUTROS ABS: 6.1 10*3/uL (ref 1.7–7.7)
Neutrophils Relative %: 84 % — ABNORMAL HIGH (ref 43–77)

## 2015-04-29 LAB — GLUCOSE, CAPILLARY
GLUCOSE-CAPILLARY: 105 mg/dL — AB (ref 65–99)
GLUCOSE-CAPILLARY: 143 mg/dL — AB (ref 65–99)
GLUCOSE-CAPILLARY: 158 mg/dL — AB (ref 65–99)
GLUCOSE-CAPILLARY: 99 mg/dL (ref 65–99)
Glucose-Capillary: 129 mg/dL — ABNORMAL HIGH (ref 65–99)
Glucose-Capillary: 119 mg/dL — ABNORMAL HIGH (ref 65–99)

## 2015-04-29 LAB — CBC
HEMATOCRIT: 24.5 % — AB (ref 36.0–46.0)
Hemoglobin: 7.3 g/dL — ABNORMAL LOW (ref 12.0–15.0)
MCH: 21.9 pg — AB (ref 26.0–34.0)
MCHC: 29.8 g/dL — ABNORMAL LOW (ref 30.0–36.0)
MCV: 73.6 fL — AB (ref 78.0–100.0)
Platelets: 190 10*3/uL (ref 150–400)
RBC: 3.33 MIL/uL — ABNORMAL LOW (ref 3.87–5.11)
RDW: 16.1 % — AB (ref 11.5–15.5)
WBC: 7.2 10*3/uL (ref 4.0–10.5)

## 2015-04-29 LAB — PHOSPHORUS: Phosphorus: 3.2 mg/dL (ref 2.5–4.6)

## 2015-04-29 LAB — TRIGLYCERIDES: Triglycerides: 48 mg/dL (ref <150)

## 2015-04-29 LAB — MAGNESIUM: Magnesium: 2 mg/dL (ref 1.7–2.4)

## 2015-04-29 LAB — PREALBUMIN: Prealbumin: 18.2 mg/dL (ref 18–38)

## 2015-04-29 MED ORDER — FAT EMULSION 20 % IV EMUL
234.0000 mL | INTRAVENOUS | Status: AC
  Administered 2015-04-29: 234 mL via INTRAVENOUS
  Filled 2015-04-29: qty 250

## 2015-04-29 MED ORDER — TRACE MINERALS CR-CU-MN-SE-ZN 10-1000-500-60 MCG/ML IV SOLN
INTRAVENOUS | Status: AC
  Administered 2015-04-29: 17:00:00 via INTRAVENOUS
  Filled 2015-04-29: qty 1700

## 2015-04-29 NOTE — Consult Note (Signed)
WOC wound consult note Reason for Consult: Consult requested to apply Eakin pouch to postop-full thickness wound with enterocutaneous fistula. CCS team following for assessment and plan of care. Wound type: Full thickness incision to midline abd with drain sutured in place at 2:00 o'clock Measurement: 4X7X2.5cm Wound bed: 20% yellow, 80% red Drainage (amount, consistency, odor) Mod amt thick green drainage, no odor Periwound: Intact skin surrounding Dressing procedure/placement/frequency: Applied small Eakin pouch over wound and supplies ordered to bedside.  Instructions provided for bedside nurses if leakage occurs. Pt is familiar with pouching since she wore an Eakin pouch prior to admission and denies further questions. Please re-consult if further assistance is needed.  Thank-you,  Julien Girt MSN, [place], [place], [place], [place]

## 2015-04-29 NOTE — Progress Notes (Signed)
Patient placed on Nasal CPAP of 6. Patient tolerating well. No O2 bleed in. RT will continue to monitor as needed.

## 2015-04-29 NOTE — Progress Notes (Signed)
[facility]  Patient Status: Active (receiving services up to time of hospitalization)  [facility] is providing the following services: RN  If patient discharges after hours, please call [phone].   Marissa [name] 04/29/2015, 9:33 AM

## 2015-04-29 NOTE — Progress Notes (Signed)
[facility] Progress Note  1 Day Post-Op  Subjective: Pt feels great.  Minimal pain, she thinks "she's healed".  She has a lot of energy and she's hungry/thirsty.  She says the nurses had to change her midline dressing because it was completely saturated with brown and yellow drainage.    Objective: Vital signs in last 24 hours: Temp:  [97.7 F (36.5 C)-98.5 F (36.9 C)] 97.9 F (36.6 C) (08/17 0522) Pulse Rate:  [64-107] 105 (08/17 0522) Resp:  [10-16] 16 (08/17 0522) BP: (107-164)/(53-79) 164/77 mmHg (08/17 0522) SpO2:  [93 %-100 %] 100 % (08/17 0522)    Intake/Output from previous day: 08/16 0701 - 08/17 0700 In: 2270.2 [P.O.:600; I.V.:722.3; TPN:947.8] Out: 1100 [Urine:1100] Intake/Output this shift:    PE: Gen:  Alert, NAD, pleasant Card:  RRR, no M/G/R heard Pulm:  CTA, no W/R/R, good effort Abd: Soft, mild tenderness, mild distension, +BS, no HSM, midline wound packed open, base of wound with minimal yellow purulent drainage, but large amount of foamy brown think clear liquid draining out.   Lab Results:   Recent Labs  04/28/15 1915 04/29/15 0500  WBC 8.1 7.2  HGB 9.2* 7.3*  HCT 30.1* 24.5*  PLT 196 190   BMET  Recent Labs  04/28/15 1915 04/29/15 0500  NA 135 132*  K 4.8 4.5  CL 104 103  CO2 21* 23  GLUCOSE 196* 147*  BUN 28* 27*  CREATININE 1.58* 1.41*  CALCIUM 9.2 8.7*   PT/INR  Recent Labs  04/27/15 1608  LABPROT 14.6  INR 1.13   CMP     Component Value Date/Time   NA 132* 04/29/2015 0500   K 4.5 04/29/2015 0500   CL 103 04/29/2015 0500   CO2 23 04/29/2015 0500   GLUCOSE 147* 04/29/2015 0500   BUN 27* 04/29/2015 0500   CREATININE 1.41* 04/29/2015 0500   CREATININE 1.64* 06/27/2014 1647   CALCIUM 8.7* 04/29/2015 0500   PROT 7.1 04/29/2015 0500   ALBUMIN 2.5* 04/29/2015 0500   AST 37 04/29/2015 0500   ALT 40 04/29/2015 0500   ALKPHOS 94 04/29/2015 0500   BILITOT 0.5 04/29/2015 0500   GFRNONAA 36* 04/29/2015 0500    GFRAA 41* 04/29/2015 0500   Lipase     Component Value Date/Time   LIPASE 27 12/26/2014 1815       Studies/Results: Dg Chest Port 1 View  04/27/2015   CLINICAL DATA:  Patient for revision of PICC line.  EXAM: PORTABLE CHEST - 1 VIEW  COMPARISON:  Single view of the chest 04/20/2015.  FINDINGS: Right PICC remains in place with the tip projecting at the confluence of the brachiocephalic veins. The lungs are clear. Heart size is normal. No pneumothorax or pleural effusion. Mild asymmetric elevation of the right hemidiaphragm relative to the left is noted.  IMPRESSION: Tip of right PICC projects at the confluence of the brachiocephalic veins. No acute disease.   Electronically Signed   By: Inge Rise M.D.   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

## 2015-04-29 NOTE — Progress Notes (Signed)
PARENTERAL NUTRITION CONSULT NOTE - FOLLOW UP  Pharmacy Consult for TPN Indication: EC fistula  No Known Allergies  Patient Measurements:    Weight 99.3 kg   Vital Signs: Temp: 97.9 F (36.6 C) (08/17 0522) Temp Source: Oral (08/17 0522) BP: 164/77 mmHg (08/17 0522) Pulse Rate: 105 (08/17 0522) Intake/Output from previous day: 08/16 0701 - 08/17 0700 In: 2270.2 [P.O.:600; I.V.:722.3; TPN:947.8] Out: 1100 [Urine:1100] Intake/Output from this shift:    Labs:  Recent Labs  04/27/15 1608 04/28/15 1915 04/29/15 0500  WBC 6.5 8.1 7.2  HGB 8.2* 9.2* 7.3*  HCT 27.2* 30.1* 24.5*  PLT 244 196 190  INR 1.13  --   --      Recent Labs  04/27/15 1608 04/28/15 1915 04/29/15 0500  NA 137 135 132*  K 4.0 4.8 4.5  CL 104 104 103  CO2 26 21* 23  GLUCOSE 87 196* 147*  BUN 32* 28* 27*  CREATININE 1.48* 1.58* 1.41*  CALCIUM 9.3 9.2 8.7*  MG  --   --  2.0  PHOS  --   --  3.2  PROT 7.4  --  7.1  ALBUMIN 2.9*  --  2.5*  AST 21  --  37  ALT 16  --  40  ALKPHOS 78  --  94  BILITOT 0.3  --  0.5  PREALBUMIN  --   --  18.2  TRIG  --   --  48   Estimated Creatinine Clearance: 40.1 mL/min (by C-G formula based on Cr of 1.41).    Recent Labs  04/28/15 2223 04/29/15 0031 04/29/15 0517  GLUCAP 154* 158* 143*    Medications:  Infusions:  . sodium chloride 20 mL/hr at 04/28/15 1855  . Marland KitchenTPN (CLINIMIX-E) Adult 70 mL/hr at 04/28/15 1741   And  . fat emulsion 240 mL (04/28/15 1742)  . lactated ringers 50 mL/hr at 04/28/15 0917    Insulin Requirements in the past 24 hours:  3 units SSI  Current Nutrition:  Chronic TPN per [place] kcal, 85g protein - my appreciation to Carolynn Sayers for her documentation of home TPN.  Clinimix E 5/15 at 70 ml/hr + 20% IVFE at 10 ml/hr. This will provide 1672 kcal and 84g protein.  Clear liquid diet  Assessment:  Admit:  Surgeries/Procedures: s/p abdominal wound exploration with removal of infected abdominal mesh  8/16 GI: EC fistula, abdominal wound. Drain in place. Endo: CBGs well-controlled, minimal SSI requirements Lytes: K 4.5, Phos 3.2, Mag 2.  She tolerates electrolytes in her TPN Renal: SCr 1.41, baseline 1.2.  She tells me she does not receive any supplemental IV fluids in addition to her TPN at home. Pulm: OSA Cards: VSS, metoprolol Hepatobil: LFTs ok.  TG 48, Prealbumin 18.2 - good baseline nutritional status. Neuro: ok ID: no antibiotics Best Practices: lovenox, PPI IV TPN Access: PICC TPN start date: chronic  Nutritional Goals:  Will target providing her chronic TPN provisions (~1600 kcal, 85g protein) pending further RD assessment   Plan:  Change to Cyclic TPN today.  Clinimix E 5/15 - 10ml/hr x 1 hour, 100 ml/hr x 16 hours, 50 ml/hr x 1 hour.  20% IVFE at 13.3 ml/hr x 18 hours. Follow for tolerance - change to 12h cycle on 8/18 if she tolerates.  Watch volume status closely with history of HFpEF. Check CBGs M3N - 2hr after cyclic TPN start (3614), 1 during cyclic TPN (4315), 1 hr after TPN stop (1300), 1 while TPN is  off (1700). TPN labs qMon/Thurs Change IV fluids to [facility].  Monitor volume status.   Legrand Como, Pharm.D., BCPS, AAHIVP Clinical Pharmacist Phone: [phone] or [phone] 04/29/2015, 7:36 AM

## 2015-04-29 NOTE — Progress Notes (Signed)
PROGRESS NOTE  Marissa [name] PHX:[id number] DOB: [birth year] DOA: 04/27/2015 PCP: [name], MD  Brief History 74 year old female patient with extensive past surgical history-left hemicolectomy with left transverse colon to sigmoid colon anastomosis/appendectomy/umbilical hernia repair (2003), repair of incarcerated ventral incisional hernia (2009), TAH with bilateral salpingo-oophorectomy with extensive enterolysis in [id number] complicated by enterocutaneous fistula (2013) which subsequently healed, June 2016 admitted again for large subcutaneous abscess and enterocutaneous fistula-managed with Eaken's pouch, bowel rest, TPN and Invanz, followed up by primary surgeon in office and due to concern for persisting underlying abscess patient admitted to hospital on 04/27/15 with plans for I & D over the next couple of days under [place]. She also has PMH of CAD, s/p OM BMS in August 2007 with ISR treated with a DES April 2009, Cath Done in August 2009 showed patency of the stent, Myoview October 2015 was low risk and echo at that time showed EF 55-60 percent. Cardiology evaluated her during recent hospitalization, determined that her cardiac status was stable and cleared her for laparotomy on 03/20/15. She also has PMH of HTN, stage III chronic kidney disease, chronic diastolic CHF, gout, OSA, GERD, HLD, diet controlled DM, DVT-not on anticoagulation secondary to recurrent diverticular and gastric bleeding and iron deficiency anemia. Assessment/Plan: #1 enterocutaneous fistula Status post abdominal wound exploration/explantation of infected abdominal mesh per Dr.Tsuei 04/28/2015 -Patient is afebrile and hemodynamically stable without leukocytosis  #2 hypertension -Blood pressures initially soft and medications were held -Restart metoprolol tartrate and monitor -Reintroduce amlodipine and ARB as blood pressure continues to increase  #3 chronic kidney disease stage III -Baseline creatinine  1.1-1.4  #4 chronic diastolic CHF Compensated.  -07/02/2014 echo EF 55-60%, no WMA  #5 obstructive sleep apnea CPAP daily at bedtime. -Not compliant with CPAP and uses it up to 1-2 times per week  #6 gastroesophageal reflux disease PPI.  #7 well-controlled type 2 diabetes mellitus diet-controlled Hemoglobin A1c was 5.7 on 03/14/2015. CBGs have ranged from 99- 122. Continue sliding scale insulin. -CBG elevation likely due to TPN  #8 history of DVT Not anticoagulation candidate secondary to recurrent diverticular and gastric bleeding--hemorrhagic gastritis noted on EGD 01/13/2015.  #9 iron deficiency anemia -drop in Hgb may be dilutional -Transfuse to hemoglobin< 7 -Iron saturation 11% -Give dose of IV iron-  #10 coronary artery disease Stable. Continue beta blocker.   Family Communication:   Pt at beside Disposition Plan:   Home when medically stable    Procedures/Studies: Ct Angio Chest Pe W/cm &/or Wo Cm  04/20/2015   CLINICAL DATA:  74 year old female with chest pain and elevated D-dimer.  EXAM: CT ANGIOGRAPHY CHEST WITH CONTRAST  TECHNIQUE: Multidetector CT imaging of the chest was performed using the standard protocol during bolus administration of intravenous contrast. Multiplanar CT image reconstructions and MIPs were obtained to evaluate the vascular anatomy.  CONTRAST:  75 mL of Omnipaque 350.  COMPARISON:  Chest CT 12/14/2010.  FINDINGS: Comment: Study is limited by a combination of respiratory motion and beam hardening artifact from the patient's large body habitus.  Mediastinum/Lymph Nodes: While there is no central, lobar or proximal segmental sized filling defect to suggest pulmonary embolism, smaller distal segmental and subsegmental sized emboli cannot be excluded secondary to respiratory motion and excessive image noise from the patient's large body habitus. Heart size is normal. There is no significant pericardial fluid, thickening or pericardial calcification.  There is atherosclerosis of the thoracic aorta, the great vessels of the mediastinum and  the coronary arteries, including calcified atherosclerotic plaque in the left anterior descending, left circumflex and right coronary arteries. No pathologically enlarged mediastinal or hilar lymph nodes. Esophagus is unremarkable in appearance. No axillary lymphadenopathy. Right upper extremity PICC with tip terminating in the mid superior vena cava.  Lungs/Pleura: No acute consolidative airspace disease. No pleural effusions. No suspicious appearing pulmonary nodules or masses.  Upper Abdomen: Status post cholecystectomy. Small calcified granulomas in the liver.  Musculoskeletal/Soft Tissues: There are no aggressive appearing lytic or blastic lesions noted in the visualized portions of the skeleton.  Review of the MIP images confirms the above findings.  IMPRESSION: 1. Although today's study is limited by excessive image noise and patient respiratory motion, there is no central, lobar or proximal segmental sized pulmonary embolism. Smaller distal segmental and subsegmental sized emboli cannot be entirely excluded. 2. Atherosclerosis, including three-vessel coronary artery disease. Assessment for potential risk factor modification, dietary therapy or pharmacologic therapy may be warranted, if clinically indicated. 3. Additional incidental findings, as above.   Electronically Signed   By: Vinnie Langton M.D.   On: 04/20/2015 16:40   Ct Abdomen Pelvis W Contrast  04/14/2015   CLINICAL DATA:  Abdominal wall abscess in the umbilical region for 1 month. History of umbilical hernia repair. Partial left colectomy, tubal ligation, hysterectomy.  EXAM: CT ABDOMEN AND PELVIS WITH CONTRAST  TECHNIQUE: Multidetector CT imaging of the abdomen and pelvis was performed using the standard protocol following bolus administration of intravenous contrast.  CONTRAST:  128mL ISOVUE-300 IOPAMIDOL (ISOVUE-300) INJECTION 61%  COMPARISON:   03/20/2015 and 03/13/2015 similar exam, fistula injection 03/23/2015  FINDINGS: Lower chest:  Lung bases are clear.  Hepatobiliary: Cholecystectomy clips are noted. Liver is unremarkable.  Pancreas: Normal  Spleen: Normal  Adrenals/Urinary Tract: Adrenal glands are normal. 1.3 cm right upper renal pole cyst. No radiopaque renal, ureteral, or bladder calculus.  Stomach/Bowel: Small hiatal hernia reidentified. Evidence of periumbilical enterocutaneous fistula a reidentified involving adjacent loops of small bowel and containing contrast/ gas level. Overall, this collection measures slightly smaller when the internal cavity component is measured using similar technique as on the prior exam, currently 3.9 x 2.0 cm image 49, previously 5.6 x 2.3 cm at a similar anatomic level.  Colonic diverticuli noted without evidence for diverticulitis.  Vascular/Lymphatic: A small amount of stranding/haziness within the root of small bowel mesentery is stable. Moderate atheromatous aortic calcification without aneurysm. No lymphadenopathy.  Other: No free air  Musculoskeletal: No acute osseous abnormality. Lumbar spine disc degenerative change.  IMPRESSION: Slight decrease in size of cavitary component of periumbilical enterocutaneous fistula.   Electronically Signed   By: Conchita Paris M.D.   On: 04/14/2015 10:47   Dg Chest Port 1 View  04/27/2015   CLINICAL DATA:  Patient for revision of PICC line.  EXAM: PORTABLE CHEST - 1 VIEW  COMPARISON:  Single view of the chest 04/20/2015.  FINDINGS: Right PICC remains in place with the tip projecting at the confluence of the brachiocephalic veins. The lungs are clear. Heart size is normal. No pneumothorax or pleural effusion. Mild asymmetric elevation of the right hemidiaphragm relative to the left is noted.  IMPRESSION: Tip of right PICC projects at the confluence of the brachiocephalic veins. No acute disease.   Electronically Signed   By: Inge Rise M.D.   On: 04/27/2015 11:29    Dg Chest Port 1 View  04/20/2015   CLINICAL DATA:  Acute onset of shortness of breath and centralized chest pain. Initial encounter.  EXAM: PORTABLE CHEST - 1 VIEW  COMPARISON:  Chest radiograph performed 03/15/2015, and CTA of the chest performed earlier today at 2:38 p.m.  FINDINGS: The lungs are hypoexpanded. Mild left basilar atelectasis is noted. No definite pleural effusion or pneumothorax is seen. There is elevation of the right hemidiaphragm.  The cardiomediastinal silhouette is borderline normal in size. No acute osseous abnormalities are identified.  IMPRESSION: Lungs hypoexpanded. Mild left basilar atelectasis noted. Elevation of the right hemidiaphragm.   Electronically Signed   By: Garald Balding M.D.   On: 04/20/2015 22:23         Subjective: Patient denies fevers, chills, headache, chest pain, dyspnea, nausea, vomiting, diarrhea, abdominal pain, dysuria, hematuria   Objective: Filed Vitals:   04/28/15 1432 04/28/15 2226 04/29/15 0226 04/29/15 0522  BP: 107/63 124/66 150/79 164/77  Pulse: 74 107 102 105  Temp: 97.8 F (36.6 C) 98.2 F (36.8 C) 98.5 F (36.9 C) 97.9 F (36.6 C)  TempSrc: Oral Oral Oral Oral  Resp: 14 15 16 16   SpO2: 98% 99% 99% 100%    Intake/Output Summary (Last 24 hours) at 04/29/15 1903 Last data filed at 04/29/15 1827  Gross per 24 hour  Intake 2470.17 ml  Output   1150 ml  Net 1320.17 ml   Weight change:  Exam:   General:  Pt is alert, follows commands appropriately, not in acute distress  HEENT: No icterus, No thrush, No neck mass, [place]/AT  Cardiovascular: RRR, S1/S2, no rubs, no gallops  Respiratory: Bibasilar crackles without any wheezing. Good air movement.   Abdomen: Soft/+BS, non tender, non distended, no guarding  Extremities: No edema, No lymphangitis, No petechiae, No rashes, no synovitis  Data Reviewed: Basic Metabolic Panel:  Recent Labs Lab 04/27/15 1608 04/28/15 1915 04/29/15 0500  NA 137 135 132*  K 4.0 4.8  4.5  CL 104 104 103  CO2 26 21* 23  GLUCOSE 87 196* 147*  BUN 32* 28* 27*  CREATININE 1.48* 1.58* 1.41*  CALCIUM 9.3 9.2 8.7*  MG  --   --  2.0  PHOS  --   --  3.2   Liver Function Tests:  Recent Labs Lab 04/27/15 1608 04/29/15 0500  AST 21 37  ALT 16 40  ALKPHOS 78 94  BILITOT 0.3 0.5  PROT 7.4 7.1  ALBUMIN 2.9* 2.5*   No results for input(s): LIPASE, AMYLASE in the last 168 hours. No results for input(s): AMMONIA in the last 168 hours. CBC:  Recent Labs Lab 04/27/15 1608 04/28/15 1915 04/29/15 0500  WBC 6.5 8.1 7.2  NEUTROABS  --   --  6.1  HGB 8.2* 9.2* 7.3*  HCT 27.2* 30.1* 24.5*  MCV 72.3* 74.1* 73.6*  PLT 244 196 190   Cardiac Enzymes: No results for input(s): CKTOTAL, CKMB, CKMBINDEX, TROPONINI in the last 168 hours. BNP: Invalid input(s): POCBNP CBG:  Recent Labs Lab 04/29/15 0031 04/29/15 0517 04/29/15 0743 04/29/15 1156 04/29/15 1712  GLUCAP 158* 143* 129* 119* 105*    Recent Results (from the past 240 hour(s))  Surgical pcr screen     Status: None   Collection Time: 04/27/15  7:41 PM  Result Value Ref Range Status   MRSA, PCR NEGATIVE NEGATIVE Final   Staphylococcus aureus NEGATIVE NEGATIVE Final    Comment:        The Xpert SA Assay (FDA approved for NASAL specimens in patients over 64 years of age), is one component of a comprehensive surveillance program.  Test performance has been  validated by [facility] for patients greater than or equal to 50 year old. It is not intended to diagnose infection nor to guide or monitor treatment.      Scheduled Meds: . Chlorhexidine Gluconate Cloth  6 each Topical Q0600  . enoxaparin (LOVENOX) injection  30 mg Subcutaneous Q24H  . feeding supplement  1 Container Oral TID BM  . insulin aspart  0-9 Units Subcutaneous 4 times per day  . iron polysaccharides  150 mg Oral Daily  . metoprolol tartrate  25 mg Oral q morning - 10a  . mupirocin ointment   Nasal BID  . pantoprazole (PROTONIX) IV   40 mg Intravenous QHS  . cyanocobalamin  2,000 mcg Oral Daily   Continuous Infusions: . Marland KitchenTPN (CLINIMIX-E) Adult 100 mL/hr at 04/29/15 1900   And  . fat emulsion 234 mL (04/29/15 1726)  . sodium chloride 10 mL (04/29/15 1140)     Kieon Lawhorn, DO  [facility] Pager [phone]  If 7PM-7AM, please contact night-coverage [email or web address] Password [email or web address] 04/29/2015, 7:03 PM   LOS: 2 days

## 2015-04-30 LAB — COMPREHENSIVE METABOLIC PANEL
ALT: 26 U/L (ref 14–54)
ANION GAP: 5 (ref 5–15)
AST: 19 U/L (ref 15–41)
Albumin: 2.6 g/dL — ABNORMAL LOW (ref 3.5–5.0)
Alkaline Phosphatase: 79 U/L (ref 38–126)
BILIRUBIN TOTAL: 0.3 mg/dL (ref 0.3–1.2)
BUN: 28 mg/dL — ABNORMAL HIGH (ref 6–20)
CALCIUM: 9 mg/dL (ref 8.9–10.3)
CO2: 25 mmol/L (ref 22–32)
Chloride: 109 mmol/L (ref 101–111)
Creatinine, Ser: 1.33 mg/dL — ABNORMAL HIGH (ref 0.44–1.00)
GFR calc non Af Amer: 38 mL/min — ABNORMAL LOW (ref >60)
GFR, EST AFRICAN AMERICAN: 44 mL/min — AB (ref >60)
Glucose, Bld: 128 mg/dL — ABNORMAL HIGH (ref 65–99)
Potassium: 4 mmol/L (ref 3.5–5.1)
Sodium: 139 mmol/L (ref 135–145)
TOTAL PROTEIN: 6.6 g/dL (ref 6.5–8.1)

## 2015-04-30 LAB — GLUCOSE, CAPILLARY
GLUCOSE-CAPILLARY: 121 mg/dL — AB (ref 65–99)
GLUCOSE-CAPILLARY: 118 mg/dL — AB (ref 65–99)
GLUCOSE-CAPILLARY: 99 mg/dL (ref 65–99)
GLUCOSE-CAPILLARY: 129 mg/dL — AB (ref 65–99)
GLUCOSE-CAPILLARY: 123 mg/dL — AB (ref 65–99)
Glucose-Capillary: 77 mg/dL (ref 65–99)

## 2015-04-30 LAB — MAGNESIUM: Magnesium: 1.8 mg/dL (ref 1.7–2.4)

## 2015-04-30 LAB — CBC
HEMATOCRIT: 24.6 % — AB (ref 36.0–46.0)
Hemoglobin: 7.4 g/dL — ABNORMAL LOW (ref 12.0–15.0)
MCH: 22 pg — ABNORMAL LOW (ref 26.0–34.0)
MCHC: 30.1 g/dL (ref 30.0–36.0)
MCV: 73.2 fL — ABNORMAL LOW (ref 78.0–100.0)
Platelets: 184 10*3/uL (ref 150–400)
RBC: 3.36 MIL/uL — ABNORMAL LOW (ref 3.87–5.11)
RDW: 16.2 % — AB (ref 11.5–15.5)
WBC: 5.5 10*3/uL (ref 4.0–10.5)

## 2015-04-30 LAB — PHOSPHORUS: PHOSPHORUS: 3.7 mg/dL (ref 2.5–4.6)

## 2015-04-30 MED ORDER — SODIUM CHLORIDE 0.9 % IV SOLN
125.0000 mg | Freq: Once | INTRAVENOUS | Status: AC
  Administered 2015-04-30: 125 mg via INTRAVENOUS
  Filled 2015-04-30: qty 10

## 2015-04-30 MED ORDER — TRACE MINERALS CR-CU-MN-SE-ZN 10-1000-500-60 MCG/ML IV SOLN
INTRAVENOUS | Status: AC
  Administered 2015-04-30 – 2015-05-01 (×2): via INTRAVENOUS
  Filled 2015-04-30: qty 1700

## 2015-04-30 MED ORDER — AMLODIPINE BESYLATE 2.5 MG PO TABS
2.5000 mg | ORAL_TABLET | Freq: Every day | ORAL | Status: DC
  Administered 2015-05-01 – 2015-05-10 (×9): 2.5 mg via ORAL
  Filled 2015-04-30 (×10): qty 1

## 2015-04-30 MED ORDER — FAT EMULSION 20 % IV EMUL
240.0000 mL | INTRAVENOUS | Status: DC
  Filled 2015-04-30: qty 250

## 2015-04-30 MED ORDER — FAT EMULSION 20 % IV EMUL
240.0000 mL | INTRAVENOUS | Status: AC
  Administered 2015-04-30: 240 mL via INTRAVENOUS
  Filled 2015-04-30: qty 250

## 2015-04-30 MED ORDER — ACETAMINOPHEN 325 MG PO TABS
650.0000 mg | ORAL_TABLET | Freq: Four times a day (QID) | ORAL | Status: DC | PRN
  Administered 2015-05-07: 650 mg via ORAL
  Filled 2015-04-30: qty 2

## 2015-04-30 MED ORDER — TRACE MINERALS CR-CU-MN-SE-ZN 10-1000-500-60 MCG/ML IV SOLN
INTRAVENOUS | Status: DC
  Filled 2015-04-30: qty 1700

## 2015-04-30 MED ORDER — INSULIN ASPART 100 UNIT/ML ~~LOC~~ SOLN
0.0000 [IU] | Freq: Four times a day (QID) | SUBCUTANEOUS | Status: DC
  Administered 2015-04-30 – 2015-05-04 (×4): 1 [IU] via SUBCUTANEOUS
  Administered 2015-05-05 (×2): 2 [IU] via SUBCUTANEOUS

## 2015-04-30 NOTE — Progress Notes (Signed)
PARENTERAL NUTRITION CONSULT NOTE - FOLLOW UP  Pharmacy Consult for TPN Indication: EC fistula  No Known Allergies  Patient Measurements:    Weight 99.3 kg  Vital Signs: Temp: 98 F (36.7 C) (08/18 0428) Temp Source: Oral (08/18 0428) BP: 130/69 mmHg (08/18 0428) Pulse Rate: 85 (08/18 0428) Intake/Output from previous day: 08/17 0701 - 08/18 0700 In: 3952.3 [P.O.:1300; I.V.:310.2; TPN:2342.1] Out: 700 [Urine:700] Intake/Output from this shift:    Labs:  Recent Labs  04/27/15 1608 04/28/15 1915 04/29/15 0500 04/30/15 0501  WBC 6.5 8.1 7.2 5.5  HGB 8.2* 9.2* 7.3* 7.4*  HCT 27.2* 30.1* 24.5* 24.6*  PLT 244 196 190 184  INR 1.13  --   --   --      Recent Labs  04/27/15 1608 04/28/15 1915 04/29/15 0500 04/30/15 0501  NA 137 135 132* 139  K 4.0 4.8 4.5 4.0  CL 104 104 103 109  CO2 26 21* 23 25  GLUCOSE 87 196* 147* 128*  BUN 32* 28* 27* 28*  CREATININE 1.48* 1.58* 1.41* 1.33*  CALCIUM 9.3 9.2 8.7* 9.0  MG  --   --  2.0 1.8  PHOS  --   --  3.2 3.7  PROT 7.4  --  7.1 6.6  ALBUMIN 2.9*  --  2.5* 2.6*  AST 21  --  37 19  ALT 16  --  40 26  ALKPHOS 78  --  94 79  BILITOT 0.3  --  0.5 0.3  PREALBUMIN  --   --  18.2  --   TRIG  --   --  48  --    CrCl cannot be calculated (Unknown ideal weight.).    Recent Labs  04/29/15 2135 04/30/15 0549 04/30/15 0733  GLUCAP 99 121* 118*    Medications:  Infusions:  . Marland KitchenTPN (CLINIMIX-E) Adult 100 mL/hr at 04/29/15 1900   And  . fat emulsion 234 mL (04/29/15 1726)  . sodium chloride 10 mL (04/29/15 1140)    Insulin Requirements in the past 24 hours:  1 unit SSI  Nutritional Goals: per RD on 8/16 Protein: 100-110 g  Kcal: 1700-1900  Fluid: 1.7-1.9 L  Current Nutrition:  Chronic TPN per [place] kcal, 85g protein - my appreciation to Carolynn Sayers for her documentation of home TPN.  Cyclic Clinimix E 0/10: 32ml/hr x 1 hour, 100 ml/hr x 16 hours, 50 ml/hr x 1 hour. 20% IVFE at 13.3 ml/hr  x 18 hours. This will provide 1672 kcal and 84g protein. Boost TID ordered (patient refusing) Clear liquid diet  Assessment:  Admit:  Surgeries/Procedures: s/p abdominal wound exploration with removal of infected abdominal mesh 8/16 GI: EC fistula, abdominal wound. Drain in place. Documented 50% intake of clear liquids Endo: CBGs well-controlled on and off TPN, minimal SSI requirements Lytes: Lytes wnl, CoCa 9.9. She tolerates electrolytes in her TPN Renal: SCr 1.33, normalized CrCl ~63ml/min. UOP reported at 747ml yesterday. She does not receive any supplemental IV fluids in addition to her TPN at home. NS @ KVO Pulm: OSA Cards: VSS, metoprolol Hepatobil: LFTs ok.  TG 48, Prealbumin 18.2 - good baseline nutritional status. Albumin low at 2.6 Neuro: ok ID: no antibiotics. Afebrile and WBC wnl. Best Practices: lovenox, PPI IV TPN Access: PICC TPN start date: chronic  Plan:  Cycle Clinimix E 5/15: 47ml/hr x 1 hour, 160 ml/hr x 10 hours, 50 ml/hr x 1 hour. 20% IVFE at 20 ml/hr x 12 hours. Continue MVI and  trace elements in TPN Adjust sensitive SSI and CBGs for 12 hour cycle TPN labs qMon/Thurs, Bmet in AM Continue IV fluids at [facility] Monitor volume status, cyclic TPN tolerance Consider increasing rate slightly tomorrow if tolerates TPN and patient continues to refuse Boost to meet 100% of nutritional needs (Need to balance fluid restriction with maximal protein and kcal needs)  Elenor Quinones, PharmD Clinical Pharmacist Pager [phone] 04/30/2015 8:35 AM

## 2015-04-30 NOTE — Clinical Documentation Improvement (Signed)
General Surgery  Please further clarify procedure documentation in the medical record of "debridement."   Type - excisional, non-excisional  Depth of - skin, subcutaneous tissue/fascia, muscle, joint, bone, etc,  Specify the type of instrument used, ex. scalpel, scissors, currette, etc.  Specify the appearance and size of the wound, ex. fresh bleeding, tissue, etc.  Specify the nature of tissue removed, ex. slough, necrotic, devitalized tissue, nonviable tissue, etc.  Other condition  Clinically Undetermined   Supporting Information:   Per 04/28/15 Op note= Explored both of these with hemostats. These both seem to track into a common cavity. I excised the skin in between the 2 areas of granulation tissue. We dissected down into an abscess. I excised all the visible granulation tissue. After examining the CT scan it appeared that we were still fairly superficial to the largest abscess. There are 2 small tunnels that were identified. We follow these down into a larger abscess. We open this for a distance of about 4 cm. We encountered the ventral X mesh that had been placed in 2009. This was obviously infected with a large amount of purulent fluid. I did not see any bile or succus entericus. There is no stool in the wound. We identified all of the Prolene sutures and removed these. The mesh was then dissected free from the fascia and removed entirely. It is difficult to identify the tissue posterior to this area. It could be small bowel or omentum.  Per 04/28/15 MD progress note = Plan to explore wound today - debride infected mesh.      Please exercise your independent, professional judgment when responding. A specific answer is not anticipated or expected.   Thank You, Juniper Cobey H. Burt Knack RN, BSN, CCM  Clinical Documentation Specialist Phone : [place]

## 2015-04-30 NOTE — Progress Notes (Signed)
RN called to patient room. Patient's blood sugar 68. RN gave patient one container of orange juice. Blood sugar rechecked 15 minutes later. Blood sugar 77 on recheck.

## 2015-04-30 NOTE — Progress Notes (Signed)
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  The patient ultimately went for I&D and explantation of infected abdominal mesh on 04/28/2015. She also has PMH of CAD, s/p OM BMS in August 2007 with ISR treated with a DES April 2009, Cath Done in August 2009 showed patency of the stent, Myoview October 2015 was low risk and echo at that time showed EF 55-60 percent. Cardiology evaluated her during recent hospitalization, determined that her cardiac status was stable and cleared her for laparotomy on 03/20/15. She also has PMH of HTN, stage III chronic kidney disease, chronic diastolic CHF, gout, OSA, GERD, HLD, diet controlled DM, DVT-not on anticoagulation secondary to recurrent diverticular and gastric bleeding and iron deficiency anemia. Assessment/Plan: enterocutaneous fistula Status post abdominal wound exploration/explantation of infected abdominal mesh per Dr.Tsuei 04/28/2015 -Patient is afebrile and hemodynamically stable without leukocytosis -Management per surgical team -Continue clear liquid diet/TPN per surgery  Generalized weakness -Check urine and urine culture -Likely due to deconditioning -PT evaluation  hypertension -Blood  pressures initially soft and medications were held -Restart metoprolol tartrate and monitor -Reintroduce amlodipine low-dose 2.5 mg -Do not plan to restart ARB in the setting of acute on chronic renal failure  chronic kidney disease stage III -Baseline creatinine 3.4-1.9  chronic diastolic CHF Compensated.  -07/02/2014 echo EF 55-60%, no WMA  obstructive sleep apnea CPAP daily at bedtime. -Not compliant with CPAP and uses it up to 1-2 times per week  gastroesophageal reflux disease PPI.  well-controlled type 2 diabetes mellitus diet-controlled Hemoglobin A1c was 5.7 on 03/14/2015. CBGs have ranged from 99- 122. Continue sliding scale insulin. -CBG elevation likely due to TPN, but essentially well controlled  history of DVT Not anticoagulation candidate secondary to recurrent diverticular and gastric bleeding--hemorrhagic gastritis noted on EGD 01/13/2015.  iron deficiency anemia -drop in Hgb may be dilutional -Transfuse to hemoglobin< 7 -Iron saturation 11% -8/18/16Give dose of IV iron- and continue po niferex  #10 coronary artery disease Stable. Continue beta blocker.   Family Communication: Pt at beside Disposition Plan: Home when medically stable    Procedures/Studies: Ct Angio Chest Pe W/cm &/or Wo Cm  04/20/2015   CLINICAL DATA:  74 year old female with chest pain and elevated D-dimer.  EXAM: CT ANGIOGRAPHY CHEST WITH CONTRAST  TECHNIQUE: Multidetector CT imaging of the chest was performed using the standard protocol during bolus administration of intravenous contrast. Multiplanar CT image reconstructions and MIPs were obtained to evaluate the vascular anatomy.  CONTRAST:  75 mL of Omnipaque 350.  COMPARISON:  Chest CT 12/14/2010.  FINDINGS: Comment: Study is limited by a combination of respiratory motion and beam hardening artifact from the patient's large body habitus.  Mediastinum/Lymph Nodes: While there is no central, lobar or proximal segmental sized filling  defect to suggest pulmonary embolism, smaller distal segmental and subsegmental sized emboli cannot be excluded secondary to respiratory motion and excessive  image noise from the patient's large body habitus. Heart size is normal. There is no significant pericardial fluid, thickening or pericardial calcification. There is atherosclerosis of the thoracic aorta, the great vessels of the mediastinum and the coronary arteries, including calcified atherosclerotic plaque in the left anterior descending, left circumflex and right coronary arteries. No pathologically enlarged mediastinal or hilar lymph nodes. Esophagus is unremarkable in appearance. No axillary lymphadenopathy. Right upper extremity PICC with tip terminating in the mid superior vena cava.  Lungs/Pleura: No acute consolidative airspace disease. No pleural effusions. No suspicious appearing pulmonary nodules or masses.  Upper Abdomen: Status post cholecystectomy. Small calcified granulomas in the liver.  Musculoskeletal/Soft Tissues: There are no aggressive appearing lytic or blastic lesions noted in the visualized portions of the skeleton.  Review of the MIP images confirms the above findings.  IMPRESSION: 1. Although today's study is limited by excessive image noise and patient respiratory motion, there is no central, lobar or proximal segmental sized pulmonary embolism. Smaller distal segmental and subsegmental sized emboli cannot be entirely excluded. 2. Atherosclerosis, including three-vessel coronary artery disease. Assessment for potential risk factor modification, dietary therapy or pharmacologic therapy may be warranted, if clinically indicated. 3. Additional incidental findings, as above.   Electronically Signed   By: Vinnie Langton M.D.   On: 04/20/2015 16:40   Ct Abdomen Pelvis W Contrast  04/14/2015   CLINICAL DATA:  Abdominal wall abscess in the umbilical region for 1 month. History of umbilical hernia repair. Partial left colectomy, tubal  ligation, hysterectomy.  EXAM: CT ABDOMEN AND PELVIS WITH CONTRAST  TECHNIQUE: Multidetector CT imaging of the abdomen and pelvis was performed using the standard protocol following bolus administration of intravenous contrast.  CONTRAST:  125mL ISOVUE-300 IOPAMIDOL (ISOVUE-300) INJECTION 61%  COMPARISON:  03/20/2015 and 03/13/2015 similar exam, fistula injection 03/23/2015  FINDINGS: Lower chest:  Lung bases are clear.  Hepatobiliary: Cholecystectomy clips are noted. Liver is unremarkable.  Pancreas: Normal  Spleen: Normal  Adrenals/Urinary Tract: Adrenal glands are normal. 1.3 cm right upper renal pole cyst. No radiopaque renal, ureteral, or bladder calculus.  Stomach/Bowel: Small hiatal hernia reidentified. Evidence of periumbilical enterocutaneous fistula a reidentified involving adjacent loops of small bowel and containing contrast/ gas level. Overall, this collection measures slightly smaller when the internal cavity component is measured using similar technique as on the prior exam, currently 3.9 x 2.0 cm image 49, previously 5.6 x 2.3 cm at a similar anatomic level.  Colonic diverticuli noted without evidence for diverticulitis.  Vascular/Lymphatic: A small amount of stranding/haziness within the root of small bowel mesentery is stable. Moderate atheromatous aortic calcification without aneurysm. No lymphadenopathy.  Other: No free air  Musculoskeletal: No acute osseous abnormality. Lumbar spine disc degenerative change.  IMPRESSION: Slight decrease in size of cavitary component of periumbilical enterocutaneous fistula.   Electronically Signed   By: Conchita Paris M.D.   On: 04/14/2015 10:47   Dg Chest Port 1 View  04/27/2015   CLINICAL DATA:  Patient for revision of PICC line.  EXAM: PORTABLE CHEST - 1 VIEW  COMPARISON:  Single view of the chest 04/20/2015.  FINDINGS: Right PICC remains in place with the tip projecting at the confluence of the brachiocephalic veins. The lungs are clear. Heart size is  normal. No pneumothorax or pleural effusion. Mild asymmetric elevation of the right hemidiaphragm relative to the left is noted.  IMPRESSION: Tip of right PICC projects at the confluence of the brachiocephalic veins. No acute disease.   Electronically Signed  By: Inge Rise M.D.   On: 04/27/2015 11:29   Dg Chest Port 1 View  04/20/2015   CLINICAL DATA:  Acute onset of shortness of breath and centralized chest pain. Initial encounter.  EXAM: PORTABLE CHEST - 1 VIEW  COMPARISON:  Chest radiograph performed 03/15/2015, and CTA of the chest performed earlier today at 2:38 p.m.  FINDINGS: The lungs are hypoexpanded. Mild left basilar atelectasis is noted. No definite pleural effusion or pneumothorax is seen. There is elevation of the right hemidiaphragm.  The cardiomediastinal silhouette is borderline normal in size. No acute osseous abnormalities are identified.  IMPRESSION: Lungs hypoexpanded. Mild left basilar atelectasis noted. Elevation of the right hemidiaphragm.   Electronically Signed   By: Garald Balding M.D.   On: 04/20/2015 22:23         Subjective: Patient's feels a little bit weak. She denies any fevers, chills, chest pain, short breath, nausea, vomiting, diarrhea, abdominal pain. Denies any headache, hemoptysis.  Objective: Filed Vitals:   04/29/15 1945 04/29/15 2119 04/30/15 0428 04/30/15 1754  BP: 142/76  130/69   Pulse: 86 84 85   Temp: 98 F (36.7 C)  98 F (36.7 C) 98.1 F (36.7 C)  TempSrc: Oral  Oral   Resp: 19 18 19    SpO2: 100% 98% 100%     Intake/Output Summary (Last 24 hours) at 04/30/15 1817 Last data filed at 04/30/15 1600  Gross per 24 hour  Intake 3612.29 ml  Output    450 ml  Net 3162.29 ml   Weight change:  Exam:   General:  Pt is alert, follows commands appropriately, not in acute distress  HEENT: No icterus, No thrush, No neck mass, [place]/AT  Cardiovascular: RRR, S1/S2, no rubs, no gallops  Respiratory: CTA bilaterally, no wheezing, no  crackles, no rhonchi  Abdomen: Soft/+BS, non tender, non distended, no guarding; no hepatosplenomegaly  Extremities: No edema, No lymphangitis, No petechiae, No rashes, no synovitis; no cyanosis or clubbing  Data Reviewed: Basic Metabolic Panel:  Recent Labs Lab 04/27/15 1608 04/28/15 1915 04/29/15 0500 04/30/15 0501  NA 137 135 132* 139  K 4.0 4.8 4.5 4.0  CL 104 104 103 109  CO2 26 21* 23 25  GLUCOSE 87 196* 147* 128*  BUN 32* 28* 27* 28*  CREATININE 1.48* 1.58* 1.41* 1.33*  CALCIUM 9.3 9.2 8.7* 9.0  MG  --   --  2.0 1.8  PHOS  --   --  3.2 3.7   Liver Function Tests:  Recent Labs Lab 04/27/15 1608 04/29/15 0500 04/30/15 0501  AST 21 37 19  ALT 16 40 26  ALKPHOS 78 94 79  BILITOT 0.3 0.5 0.3  PROT 7.4 7.1 6.6  ALBUMIN 2.9* 2.5* 2.6*   No results for input(s): LIPASE, AMYLASE in the last 168 hours. No results for input(s): AMMONIA in the last 168 hours. CBC:  Recent Labs Lab 04/27/15 1608 04/28/15 1915 04/29/15 0500 04/30/15 0501  WBC 6.5 8.1 7.2 5.5  NEUTROABS  --   --  6.1  --   HGB 8.2* 9.2* 7.3* 7.4*  HCT 27.2* 30.1* 24.5* 24.6*  MCV 72.3* 74.1* 73.6* 73.2*  PLT 244 196 190 184   Cardiac Enzymes: No results for input(s): CKTOTAL, CKMB, CKMBINDEX, TROPONINI in the last 168 hours. BNP: Invalid input(s): POCBNP CBG:  Recent Labs Lab 04/29/15 1712 04/29/15 2135 04/30/15 0549 04/30/15 0733 04/30/15 1144  GLUCAP 105* 99 121* 118* 99    Recent Results (from the past 240 hour(s))  Surgical pcr screen     Status: None   Collection Time: 04/27/15  7:41 PM  Result Value Ref Range Status   MRSA, PCR NEGATIVE NEGATIVE Final   Staphylococcus aureus NEGATIVE NEGATIVE Final    Comment:        The Xpert SA Assay (FDA approved for NASAL specimens in patients over 65 years of age), is one component of a comprehensive surveillance program.  Test performance has been validated by [facility] for patients greater than or equal to 82 year old. It  is not intended to diagnose infection nor to guide or monitor treatment.      Scheduled Meds: . Chlorhexidine Gluconate Cloth  6 each Topical Q0600  . enoxaparin (LOVENOX) injection  30 mg Subcutaneous Q24H  . feeding supplement  1 Container Oral TID BM  . insulin aspart  0-9 Units Subcutaneous 4 times per day  . iron polysaccharides  150 mg Oral Daily  . metoprolol tartrate  25 mg Oral q morning - 10a  . mupirocin ointment   Nasal BID  . pantoprazole (PROTONIX) IV  40 mg Intravenous QHS  . cyanocobalamin  2,000 mcg Oral Daily   Continuous Infusions: . sodium chloride 10 mL/hr at 04/30/15 1115  . fat emulsion     And  . Marland KitchenTPN (CLINIMIX-E) Adult       Raahi Korber, DO  [facility] Pager [phone]  If 7PM-7AM, please contact night-coverage [email or web address] Password [facility] 04/30/2015, 6:17 PM   LOS: 3 days

## 2015-04-30 NOTE — Progress Notes (Signed)
Patient's Eakin pouch leaking; connected to suction catheter. Suction catheter coonected to NG tubing and NG canister so we can measure output. Pouch is curently not leal=king and is pulling drainage into canister.

## 2015-04-30 NOTE — Progress Notes (Signed)
Spoke with pt regarding nocturnal cpap.  Pt stated she only wore it for a little while last night and does not want to wear it tonight.  Pt was advised that RT is available all night should she change her mind.

## 2015-04-30 NOTE — Progress Notes (Signed)
[facility] Progress Note  2 Days Post-Op  Subjective: Pt doing well, but eakins pouch fell off.  No N/V, tolerating clears well.  On cyclical TPN.  Ambulating well .  +flatus, but no BM.  Objective: Vital signs in last 24 hours: Temp:  [98 F (36.7 C)] 98 F (36.7 C) (08/18 0428) Pulse Rate:  [84-86] 85 (08/18 0428) Resp:  [18-19] 19 (08/18 0428) BP: (130-142)/(69-76) 130/69 mmHg (08/18 0428) SpO2:  [98 %-100 %] 100 % (08/18 0428) FiO2 (%):  [21 %] 21 % (08/17 2119) Last BM Date: 04/27/15  Intake/Output from previous day: 08/17 0701 - 08/18 0700 In: 3952.3 [P.O.:1300; I.V.:310.2; TPN:2342.1] Out: 700 [Urine:700] Intake/Output this shift:    PE: Gen:  Alert, NAD, pleasant Abd: Soft, mild tenderness, mild distension, +BS, no HSM, midline wound packed open, base of wound with minimal yellow purulent drainage, but large amount of foamy brown think clear liquid draining out.  Eakins pouch removed and WD dressing now in place.  Lab Results:   Recent Labs  04/29/15 0500 04/30/15 0501  WBC 7.2 5.5  HGB 7.3* 7.4*  HCT 24.5* 24.6*  PLT 190 184   BMET  Recent Labs  04/29/15 0500 04/30/15 0501  NA 132* 139  K 4.5 4.0  CL 103 109  CO2 23 25  GLUCOSE 147* 128*  BUN 27* 28*  CREATININE 1.41* 1.33*  CALCIUM 8.7* 9.0   PT/INR  Recent Labs  04/27/15 1608  LABPROT 14.6  INR 1.13   CMP     Component Value Date/Time   NA 139 04/30/2015 0501   K 4.0 04/30/2015 0501   CL 109 04/30/2015 0501   CO2 25 04/30/2015 0501   GLUCOSE 128* 04/30/2015 0501   BUN 28* 04/30/2015 0501   CREATININE 1.33* 04/30/2015 0501   CREATININE 1.64* 06/27/2014 1647   CALCIUM 9.0 04/30/2015 0501   PROT 6.6 04/30/2015 0501   ALBUMIN 2.6* 04/30/2015 0501   AST 19 04/30/2015 0501   ALT 26 04/30/2015 0501   ALKPHOS 79 04/30/2015 0501   BILITOT 0.3 04/30/2015 0501   GFRNONAA 38* 04/30/2015 0501   GFRAA 44* 04/30/2015 0501   Lipase     Component Value Date/Time   LIPASE  27 12/26/2014 1815       Studies/Results: No results found.  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 POD #2 s/p Abdominal wound exploration/explanation of infected abdominal mesh -Afebrile, normal WBC -Clear liquids, cyclical TPN -Eakins pouch, WOC following, ordered HH -Eakins pouch fell off last night and was replaced with WD dressings, still high drainage, day nurses will try to re-apply and call WOC if troubles arise -IVF, pain control, antiemetics -Ambulate and IS -SCD's and lovenox -Eakins pouch will help to monitor wound output -Repeat labs in am  ABL anemia - Hgb starting to stabilize at 7.4 Elevated creatinine - 1.33 - improved Disp - Keep here until we can get the eakins pouch to adhere correctly.    LOS: 3 days    [name] 04/30/2015, 7:50 AM Pager: [phone]

## 2015-04-30 NOTE — Consult Note (Addendum)
WOC follow-up: Eakin pouch has been changed 3 times in the past 24 hours by staff nurses due to leaking.  Difficult pouching situation R/T [place] at 5:00 o'clock and moist scar tissue to midline abd which makes it difficult to maintain pouch seal. Wound appearance unchanged from previous consult, mod amt green drainage from fistula. Wound area is very painful to touch. Applied small Eakin pouch with low wall suction attached to bag with a suction catheter to attempt to dry and heal skin and increase pouch wear time.  Supplies at bedside and instructions provided for staff nurses if leakage occurs. Please re-consult if further assistance is needed.  Thank-you,  Julien Girt MSN, [place], [place], [place], [place]

## 2015-05-01 DIAGNOSIS — I2510 Atherosclerotic heart disease of native coronary artery without angina pectoris: Secondary | ICD-10-CM

## 2015-05-01 DIAGNOSIS — Z9861 Coronary angioplasty status: Secondary | ICD-10-CM

## 2015-05-01 LAB — CBC
HCT: 24.3 % — ABNORMAL LOW (ref 36.0–46.0)
Hemoglobin: 7.4 g/dL — ABNORMAL LOW (ref 12.0–15.0)
MCH: 22.3 pg — ABNORMAL LOW (ref 26.0–34.0)
MCHC: 30.5 g/dL (ref 30.0–36.0)
MCV: 73.2 fL — AB (ref 78.0–100.0)
PLATELETS: 179 10*3/uL (ref 150–400)
RBC: 3.32 MIL/uL — AB (ref 3.87–5.11)
RDW: 15.9 % — AB (ref 11.5–15.5)
WBC: 6.2 10*3/uL (ref 4.0–10.5)

## 2015-05-01 LAB — URINALYSIS, ROUTINE W REFLEX MICROSCOPIC
BILIRUBIN URINE: NEGATIVE
GLUCOSE, UA: NEGATIVE mg/dL
Hgb urine dipstick: NEGATIVE
Ketones, ur: NEGATIVE mg/dL
LEUKOCYTES UA: NEGATIVE
NITRITE: NEGATIVE
PH: 5.5 (ref 5.0–8.0)
Protein, ur: NEGATIVE mg/dL
SPECIFIC GRAVITY, URINE: 1.013 (ref 1.005–1.030)
Urobilinogen, UA: 0.2 mg/dL (ref 0.0–1.0)

## 2015-05-01 LAB — BASIC METABOLIC PANEL
ANION GAP: 7 (ref 5–15)
BUN: 34 mg/dL — ABNORMAL HIGH (ref 6–20)
CALCIUM: 8.6 mg/dL — AB (ref 8.9–10.3)
CO2: 23 mmol/L (ref 22–32)
Chloride: 103 mmol/L (ref 101–111)
Creatinine, Ser: 1.43 mg/dL — ABNORMAL HIGH (ref 0.44–1.00)
GFR calc Af Amer: 41 mL/min — ABNORMAL LOW (ref >60)
GFR, EST NON AFRICAN AMERICAN: 35 mL/min — AB (ref >60)
GLUCOSE: 128 mg/dL — AB (ref 65–99)
POTASSIUM: 4.6 mmol/L (ref 3.5–5.1)
SODIUM: 133 mmol/L — AB (ref 135–145)

## 2015-05-01 LAB — GLUCOSE, CAPILLARY
GLUCOSE-CAPILLARY: 104 mg/dL — AB (ref 65–99)
GLUCOSE-CAPILLARY: 79 mg/dL (ref 65–99)
GLUCOSE-CAPILLARY: 120 mg/dL — AB (ref 65–99)
Glucose-Capillary: 68 mg/dL (ref 65–99)
Glucose-Capillary: 120 mg/dL — ABNORMAL HIGH (ref 65–99)

## 2015-05-01 MED ORDER — FAT EMULSION 20 % IV EMUL
240.0000 mL | INTRAVENOUS | Status: AC
  Administered 2015-05-01: 240 mL via INTRAVENOUS
  Filled 2015-05-01: qty 250

## 2015-05-01 MED ORDER — TRACE MINERALS CR-CU-MN-SE-ZN 10-1000-500-60 MCG/ML IV SOLN
INTRAVENOUS | Status: AC
  Administered 2015-05-01: 18:00:00 via INTRAVENOUS
  Filled 2015-05-01: qty 1700

## 2015-05-01 NOTE — Progress Notes (Signed)
[facility] Progress Note  3 Days Post-Op  Subjective: Pt says yesterday wasn't a good day.  No N/V, tolerating clears.  C/o abdominal pain.  Says the suction is sucking out her blood.  Not much flatus or BM.  No abdominal distension.  Objective: Vital signs in last 24 hours: Temp:  [97.8 F (36.6 C)-98.7 F (37.1 C)] 98.7 F (37.1 C) (08/19 0652) Pulse Rate:  [74-87] 87 (08/19 0652) Resp:  [16-18] 16 (08/19 0652) BP: (103-117)/(50-59) 103/50 mmHg (08/19 0652) SpO2:  [100 %] 100 % (08/19 0652) Last BM Date: 04/27/15  Intake/Output from previous day: 08/18 0701 - 08/19 0700 In: 9702.6 [P.O.:600; I.V.:245.8; IV Piggyback:110; TPN:2742.4] Out: 450 [Urine:200; Drains:250] Intake/Output this shift:    PE: Gen:  Alert, NAD, pleasant Abd: Soft, ND, mildly tender around suction tubing in wound, open visible wound with bilious and yellow purulent drainage, few drops of blood in tubing, (334mL output since placed), +BS, no HSM   Lab Results:   Recent Labs  04/30/15 0501 05/01/15 0503  WBC 5.5 6.2  HGB 7.4* 7.4*  HCT 24.6* 24.3*  PLT 184 179   BMET  Recent Labs  04/30/15 0501 05/01/15 0503  NA 139 133*  K 4.0 4.6  CL 109 103  CO2 25 23  GLUCOSE 128* 128*  BUN 28* 34*  CREATININE 1.33* 1.43*  CALCIUM 9.0 8.6*   PT/INR No results for input(s): LABPROT, INR in the last 72 hours. CMP     Component Value Date/Time   NA 133* 05/01/2015 0503   K 4.6 05/01/2015 0503   CL 103 05/01/2015 0503   CO2 23 05/01/2015 0503   GLUCOSE 128* 05/01/2015 0503   BUN 34* 05/01/2015 0503   CREATININE 1.43* 05/01/2015 0503   CREATININE 1.64* 06/27/2014 1647   CALCIUM 8.6* 05/01/2015 0503   PROT 6.6 04/30/2015 0501   ALBUMIN 2.6* 04/30/2015 0501   AST 19 04/30/2015 0501   ALT 26 04/30/2015 0501   ALKPHOS 79 04/30/2015 0501   BILITOT 0.3 04/30/2015 0501   GFRNONAA 35* 05/01/2015 0503   GFRAA 41* 05/01/2015 0503   Lipase     Component Value Date/Time   LIPASE  27 12/26/2014 1815       Studies/Results: No results found.  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

## 2015-05-01 NOTE — Care Management Important Message (Signed)
Important Message  Patient Details  Name: Lanetta Figuero MRN: [id number] Date of Birth: 1941-01-09   Medicare Important Message Given:  Yes-second notification given    Ella Bodo, RN 05/01/2015, 4:31 PM

## 2015-05-01 NOTE — Progress Notes (Signed)
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 The patient ultimately went for I&D and explantation of infected abdominal mesh on 04/28/2015. She also has PMH of CAD, s/p OM BMS in August 2007 with ISR treated with a DES April 2009, Cath Done in August 2009 showed patency of the stent, Myoview October 2015 was low risk and echo at that time showed EF 55-60 percent. Cardiology evaluated her during recent hospitalization, determined that her cardiac status was stable and cleared her for laparotomy on 03/20/15. She also has PMH of HTN, stage III chronic kidney disease, chronic diastolic CHF, gout, OSA, GERD, HLD, diet controlled DM, DVT-not on anticoagulation secondary to recurrent diverticular and gastric bleeding and iron deficiency anemia. Assessment/Plan: enterocutaneous fistula Status post abdominal wound exploration/explantation of infected abdominal mesh per Dr.Tsuei 04/28/2015 -Patient is afebrile and hemodynamically stable without leukocytosis -Management per surgical team -Continue clear liquid diet/TPN per surgery -Eakins pouch  Generalized weakness -Check urine and urine culture--not suggestive of UTI -Likely due to deconditioning -PT  evaluation-->no home needs -no focal neuro signs  hypertension -Blood pressures initially soft and medications were held -Restart metoprolol tartrate and monitor -Reintroduce amlodipine low-dose 2.5 mg -Do not plan to restart ARB in the setting of acute on chronic renal failure  chronic kidney disease stage III -Baseline creatinine 1.1-5.7  chronic diastolic CHF Compensated.  -07/02/2014 echo EF 55-60%, no WMA  obstructive sleep apnea CPAP daily at bedtime. -Not compliant with CPAP and uses it up to 1-2 times per week  gastroesophageal reflux disease PPI.  well-controlled type 2 diabetes mellitus diet-controlled Hemoglobin A1c was 5.7 on 03/14/2015.  -CBGs controlled Continue sliding scale insulin. -Intermitten CBG elevation likely due to TPN, but essentially well controlled  history of DVT Not anticoagulation candidate secondary to recurrent diverticular and gastric bleeding--hemorrhagic gastritis noted on EGD 01/13/2015.  iron deficiency anemia -drop in Hgb may be dilutional but remains stable now -Transfuse to hemoglobin< 7 -Iron saturation 11% -8/18/16Give dose of IV iron- and continue po niferex after d/c  #10 coronary artery disease Stable. Continue beta blocker.   Family Communication: Pt at beside Disposition Plan: Home this weekend          Procedures/Studies: Ct Angio Chest Pe W/cm &/or Wo Cm  04/20/2015   CLINICAL DATA:  74 year old female with chest pain and elevated D-dimer.  EXAM: CT ANGIOGRAPHY CHEST WITH CONTRAST  TECHNIQUE: Multidetector CT imaging of the chest was performed using the standard protocol during bolus administration of intravenous contrast. Multiplanar CT image reconstructions and MIPs were obtained to evaluate the vascular anatomy.  CONTRAST:  75 mL of Omnipaque 350.  COMPARISON:  Chest CT 12/14/2010.  FINDINGS: Comment: Study is limited by a combination of respiratory motion and beam hardening artifact from the patient's large  body habitus.  Mediastinum/Lymph Nodes: While there is no central, lobar or proximal segmental sized filling defect  to suggest pulmonary embolism, smaller distal segmental and subsegmental sized emboli cannot be excluded secondary to respiratory motion and excessive image noise from the patient's large body habitus. Heart size is normal. There is no significant pericardial fluid, thickening or pericardial calcification. There is atherosclerosis of the thoracic aorta, the great vessels of the mediastinum and the coronary arteries, including calcified atherosclerotic plaque in the left anterior descending, left circumflex and right coronary arteries. No pathologically enlarged mediastinal or hilar lymph nodes. Esophagus is unremarkable in appearance. No axillary lymphadenopathy. Right upper extremity PICC with tip terminating in the mid superior vena cava.  Lungs/Pleura: No acute consolidative airspace disease. No pleural effusions. No suspicious appearing pulmonary nodules or masses.  Upper Abdomen: Status post cholecystectomy. Small calcified granulomas in the liver.  Musculoskeletal/Soft Tissues: There are no aggressive appearing lytic or blastic lesions noted in the visualized portions of the skeleton.  Review of the MIP images confirms the above findings.  IMPRESSION: 1. Although today's study is limited by excessive image noise and patient respiratory motion, there is no central, lobar or proximal segmental sized pulmonary embolism. Smaller distal segmental and subsegmental sized emboli cannot be entirely excluded. 2. Atherosclerosis, including three-vessel coronary artery disease. Assessment for potential risk factor modification, dietary therapy or pharmacologic therapy may be warranted, if clinically indicated. 3. Additional incidental findings, as above.   Electronically Signed   By: Vinnie Langton M.D.   On: 04/20/2015 16:40   Ct Abdomen Pelvis W Contrast  04/14/2015   CLINICAL DATA:  Abdominal wall  abscess in the umbilical region for 1 month. History of umbilical hernia repair. Partial left colectomy, tubal ligation, hysterectomy.  EXAM: CT ABDOMEN AND PELVIS WITH CONTRAST  TECHNIQUE: Multidetector CT imaging of the abdomen and pelvis was performed using the standard protocol following bolus administration of intravenous contrast.  CONTRAST:  110mL ISOVUE-300 IOPAMIDOL (ISOVUE-300) INJECTION 61%  COMPARISON:  03/20/2015 and 03/13/2015 similar exam, fistula injection 03/23/2015  FINDINGS: Lower chest:  Lung bases are clear.  Hepatobiliary: Cholecystectomy clips are noted. Liver is unremarkable.  Pancreas: Normal  Spleen: Normal  Adrenals/Urinary Tract: Adrenal glands are normal. 1.3 cm right upper renal pole cyst. No radiopaque renal, ureteral, or bladder calculus.  Stomach/Bowel: Small hiatal hernia reidentified. Evidence of periumbilical enterocutaneous fistula a reidentified involving adjacent loops of small bowel and containing contrast/ gas level. Overall, this collection measures slightly smaller when the internal cavity component is measured using similar technique as on the prior exam, currently 3.9 x 2.0 cm image 49, previously 5.6 x 2.3 cm at a similar anatomic level.  Colonic diverticuli noted without evidence for diverticulitis.  Vascular/Lymphatic: A small amount of stranding/haziness within the root of small bowel mesentery is stable. Moderate atheromatous aortic calcification without aneurysm. No lymphadenopathy.  Other: No free air  Musculoskeletal: No acute osseous abnormality. Lumbar spine disc degenerative change.  IMPRESSION: Slight decrease in size of cavitary component of periumbilical enterocutaneous fistula.   Electronically Signed   By: Conchita Paris M.D.   On: 04/14/2015 10:47   Dg Chest Port 1 View  04/27/2015   CLINICAL DATA:  Patient for revision of PICC line.  EXAM: PORTABLE CHEST - 1 VIEW  COMPARISON:  Single view of the chest 04/20/2015.  FINDINGS: Right PICC remains in  place with the tip projecting at the confluence of the brachiocephalic veins. The lungs are clear. Heart size is normal. No pneumothorax or pleural effusion. Mild asymmetric elevation of the right hemidiaphragm relative to the left is noted.  IMPRESSION: Tip  of right PICC projects at the confluence of the brachiocephalic veins. No acute disease.   Electronically Signed   By: Inge Rise M.D.   On: 04/27/2015 11:29   Dg Chest Port 1 View  04/20/2015   CLINICAL DATA:  Acute onset of shortness of breath and centralized chest pain. Initial encounter.  EXAM: PORTABLE CHEST - 1 VIEW  COMPARISON:  Chest radiograph performed 03/15/2015, and CTA of the chest performed earlier today at 2:38 p.m.  FINDINGS: The lungs are hypoexpanded. Mild left basilar atelectasis is noted. No definite pleural effusion or pneumothorax is seen. There is elevation of the right hemidiaphragm.  The cardiomediastinal silhouette is borderline normal in size. No acute osseous abnormalities are identified.  IMPRESSION: Lungs hypoexpanded. Mild left basilar atelectasis noted. Elevation of the right hemidiaphragm.   Electronically Signed   By: Garald Balding M.D.   On: 04/20/2015 22:23         Subjective: Overall, abdominal pain is much improved. She complains of some general weakness. Denies any chest pain, short of breath, vomiting, diarrhea. She is passing flatus but no bowel movement. Denies any headache, fevers, chills.  Objective: Filed Vitals:   05/01/15 6761 05/01/15 0805 05/01/15 0945 05/01/15 1349  BP: 103/50  127/74 107/72  Pulse: 87   84  Temp: 98.7 F (37.1 C)   98.4 F (36.9 C)  TempSrc: Axillary   Oral  Resp: 16   16  Height:  5\' 4"  (1.626 m)    Weight:  99.3 kg (218 lb 14.7 oz)    SpO2: 100%   100%    Intake/Output Summary (Last 24 hours) at 05/01/15 1721 Last data filed at 05/01/15 1500  Gross per 24 hour  Intake 3648.21 ml  Output   1250 ml  Net 2398.21 ml   Weight change:  Exam:   General:   Pt is alert, follows commands appropriately, not in acute distress  HEENT: No icterus, No thrush, No neck mass, [place]/AT  Cardiovascular: RRR, S1/S2, no rubs, no gallops  Respiratory: CTA bilaterally, no wheezing, no crackles, no rhonchi  Abdomen: Soft/+BS, lower quadrant tender, non distended, no guarding  Extremities: No edema, No lymphangitis, No petechiae, No rashes, no synovitis  Data Reviewed: Basic Metabolic Panel:  Recent Labs Lab 04/27/15 1608 04/28/15 1915 04/29/15 0500 04/30/15 0501 05/01/15 0503  NA 137 135 132* 139 133*  K 4.0 4.8 4.5 4.0 4.6  CL 104 104 103 109 103  CO2 26 21* 23 25 23   GLUCOSE 87 196* 147* 128* 128*  BUN 32* 28* 27* 28* 34*  CREATININE 1.48* 1.58* 1.41* 1.33* 1.43*  CALCIUM 9.3 9.2 8.7* 9.0 8.6*  MG  --   --  2.0 1.8  --   PHOS  --   --  3.2 3.7  --    Liver Function Tests:  Recent Labs Lab 04/27/15 1608 04/29/15 0500 04/30/15 0501  AST 21 37 19  ALT 16 40 26  ALKPHOS 78 94 79  BILITOT 0.3 0.5 0.3  PROT 7.4 7.1 6.6  ALBUMIN 2.9* 2.5* 2.6*   No results for input(s): LIPASE, AMYLASE in the last 168 hours. No results for input(s): AMMONIA in the last 168 hours. CBC:  Recent Labs Lab 04/27/15 1608 04/28/15 1915 04/29/15 0500 04/30/15 0501 05/01/15 0503  WBC 6.5 8.1 7.2 5.5 6.2  NEUTROABS  --   --  6.1  --   --   HGB 8.2* 9.2* 7.3* 7.4* 7.4*  HCT 27.2* 30.1* 24.5* 24.6* 24.3*  MCV 72.3* 74.1* 73.6* 73.2* 73.2*  PLT 244 196 190 184 179   Cardiac Enzymes: No results for input(s): CKTOTAL, CKMB, CKMBINDEX, TROPONINI in the last 168 hours. BNP: Invalid input(s): POCBNP CBG:  Recent Labs Lab 04/30/15 1810 04/30/15 2002 04/30/15 2308 05/01/15 0650 05/01/15 1409  GLUCAP 77 129* 123* 104* 79    Recent Results (from the past 240 hour(s))  Surgical pcr screen     Status: None   Collection Time: 04/27/15  7:41 PM  Result Value Ref Range Status   MRSA, PCR NEGATIVE NEGATIVE Final   Staphylococcus aureus NEGATIVE  NEGATIVE Final    Comment:        The Xpert SA Assay (FDA approved for NASAL specimens in patients over 67 years of age), is one component of a comprehensive surveillance program.  Test performance has been validated by [facility] for patients greater than or equal to 74 year old. It is not intended to diagnose infection nor to guide or monitor treatment.   Urine culture     Status: None (Preliminary result)   Collection Time: 04/30/15  9:20 PM  Result Value Ref Range Status   Specimen Description URINE, RANDOM  Final   Special Requests NONE  Final   Culture CULTURE REINCUBATED FOR BETTER GROWTH  Final   Report Status PENDING  Incomplete     Scheduled Meds: . amLODipine  2.5 mg Oral Daily  . Chlorhexidine Gluconate Cloth  6 each Topical Q0600  . enoxaparin (LOVENOX) injection  30 mg Subcutaneous Q24H  . feeding supplement  1 Container Oral TID BM  . insulin aspart  0-9 Units Subcutaneous 4 times per day  . iron polysaccharides  150 mg Oral Daily  . metoprolol tartrate  25 mg Oral q morning - 10a  . mupirocin ointment   Nasal BID  . pantoprazole (PROTONIX) IV  40 mg Intravenous QHS  . cyanocobalamin  2,000 mcg Oral Daily   Continuous Infusions: . sodium chloride 10 mL/hr at 04/30/15 1115  . Marland KitchenTPN (CLINIMIX-E) Adult     And  . fat emulsion       Camryn Lampson, DO  [facility] Pager [phone]  If 7PM-7AM, please contact night-coverage [email or web address] Password [email or web address] 05/01/2015, 5:21 PM   LOS: 4 days

## 2015-05-01 NOTE — Progress Notes (Signed)
PARENTERAL NUTRITION CONSULT NOTE - FOLLOW UP  Pharmacy Consult for TPN Indication: EC fistula  No Known Allergies  Patient Measurements:    Weight 99.3 kg  Vital Signs: Temp: 98.7 F (37.1 C) (08/19 0652) Temp Source: Axillary (08/19 0652) BP: 103/50 mmHg (08/19 0652) Pulse Rate: 87 (08/19 0652) Intake/Output from previous day: 08/18 0701 - 08/19 0700 In: 2440.1 [P.O.:600; I.V.:245.8; IV Piggyback:110; TPN:2742.4] Out: 450 [Urine:200; Drains:250] Intake/Output from this shift: Total I/O In: -  Out: 150 [Urine:150] Labs:  Recent Labs  04/29/15 0500 04/30/15 0501 05/01/15 0503  WBC 7.2 5.5 6.2  HGB 7.3* 7.4* 7.4*  HCT 24.5* 24.6* 24.3*  PLT 190 184 179    Recent Labs  04/29/15 0500 04/30/15 0501 05/01/15 0503  NA 132* 139 133*  K 4.5 4.0 4.6  CL 103 109 103  CO2 23 25 23   GLUCOSE 147* 128* 128*  BUN 27* 28* 34*  CREATININE 1.41* 1.33* 1.43*  CALCIUM 8.7* 9.0 8.6*  MG 2.0 1.8  --   PHOS 3.2 3.7  --   PROT 7.1 6.6  --   ALBUMIN 2.5* 2.6*  --   AST 37 19  --   ALT 40 26  --   ALKPHOS 94 79  --   BILITOT 0.5 0.3  --   PREALBUMIN 18.2  --   --   TRIG 48  --   --    CrCl cannot be calculated (Unknown ideal weight.).   Recent Labs  04/30/15 2002 04/30/15 2308 05/01/15 0650  GLUCAP 129* 123* 104*   Medications:  Infusions:  . sodium chloride 10 mL/hr at 04/30/15 1115   Insulin Requirements in the past 24 hours:  2 units SSI given - CBG range 99-128  Nutritional Goals: per RD on 8/16 Protein: 100-110 g  Kcal: 1700-1900  Fluid: 1.7-1.9 L  Current Nutrition:  Chronic TPN per [place] kcal, 85g protein - my appreciation to Carolynn Sayers for her documentation of home TPN.  Cyclic Clinimix E 0/27: 80ml/hr x 1 hour, 100 ml/hr x 16 hours, 50 ml/hr x 1 hour. 20% IVFE at 13.3 ml/hr x 18 hours. This will provide 1672 kcal and 84g protein. Boost TID ordered (patient refusing)  Clear liquid diet - 720 charted  8/18  Assessment:  Admit:  Surgeries/Procedures: s/p abdominal wound exploration with removal of infected abdominal mesh 8/16 GI: EC fistula, abdominal wound. Drain in place. Documented 50% intake of clear liquids Endo: CBGs well-controlled on and off TPN, minimal SSI requirements Lytes: Lytes stable and wnl, CoCa 9.9. She has electrolytes in her TPN Renal: SCr 1.43 (back up slightly).  Normalized crcl ~ 61ml/min.  UOP ~ 243ml 8/18.  No  supplemental IV fluids in addition to her TPN at home. NS @ KVO Pulm: OSA Cards: VSS, metoprolol Hepatobil: LFTs ok.  TG 48, Prealbumin 18.2 - good baseline nutritional status. Albumin low at 2.6 Neuro: ok ID: no antibiotics. Afebrile and WBC wnl. Best Practices: lovenox, PPI IV TPN Access: PICC TPN start date: chronic  Plan:  Cycle Clinimix E 5/15: 39ml/hr x 1 hour, 160 ml/hr x 10 hours, 50 ml/hr x 1 hour. 20% IVFE at 20 ml/hr x 12 hours. Continue MVI and trace elements in TPN Adjust sensitive SSI and CBGs for 12 hour cycle TPN labs qMon/Thurs, Bmet in AM Continue IV fluids at [facility] Monitor volume status, cyclic TPN tolerance  Rober Minion, PharmD., MS Clinical Pharmacist Pager:  [phone] Thank you for allowing pharmacy to be  part of this patients care team. 05/01/2015 8:11 AM

## 2015-05-01 NOTE — Evaluation (Signed)
Physical Therapy Evaluation Patient Details Name: Marissa [name] MRN: [id number] DOB: 01-13-1941 Today's Date: 05/01/2015   History of Present Illness  74 y.o. female admitted with enterocutaneous fistual. s/p Abdominal wound exploration.  Clinical Impression  Pt admitted with the above complications, evaluated by PT following the above procedure. Pt currently with functional limitations due to the deficits listed below (see PT Problem List). Ambulates generally well with minimal balance deficits noted today. States she feels much better today and is eager to return home. States her daughter can assist as needed at d/c. Adequate for d/c from a therapy standpoint however, we will continue to monitor and progress her functional abilities with higher level balance tasks while admitted, likely follow up Monday if still admitted.      Follow Up Recommendations No PT follow up;Supervision - Intermittent    Equipment Recommendations  None recommended by PT    Recommendations for Other Services       Precautions / Restrictions Precautions Precautions: None Restrictions Weight Bearing Restrictions: No      Mobility  Bed Mobility Overal bed mobility: Modified Independent             General bed mobility comments: extra time  Transfers Overall transfer level: Needs assistance Equipment used: None Transfers: Sit to/from Stand Sit to Stand: Supervision         General transfer comment: supervision for safety. Minimal sway noted. Does not reach for furniture. Declines to use device.  Ambulation/Gait Ambulation/Gait assistance: Supervision Ambulation Distance (Feet): 525 Feet Assistive device: None Gait Pattern/deviations: Step-through pattern;Trunk flexed;Decreased stride length Gait velocity: decreased Gait velocity interpretation: Below normal speed for age/gender General Gait Details: Mildy guarded with minimal sway noted. Slight dyspnea with SpO2 95% on room air. Declines  to use RW. VC for upright posture and awareness of surroundings, safety, and slight sway noted. No loss of balance requiring physical assist during bout.  Stairs            Wheelchair Mobility    Modified Rankin (Stroke Patients Only)       Balance Overall balance assessment: Needs assistance Sitting-balance support: No upper extremity supported;Feet supported Sitting balance-Leahy Scale: Normal     Standing balance support: No upper extremity supported Standing balance-Leahy Scale: Good                               Pertinent Vitals/Pain Pain Assessment: No/denies pain    Home Living Family/patient expects to be discharged to:: Private residence Living Arrangements: Children Available Help at Discharge: Family;Available 24 hours/day (States daughter can provide 24/7 care) Type of Home: House Home Access: Level entry     Home Layout: One level Home Equipment: Cane - single point;Walker - 2 wheels;Walker - 4 wheels;Bedside commode;Wheelchair - Brewing technologist - built in      Prior Function Level of Independence: Independent with assistive device(s)         Comments: occasionally uses RW     Hand Dominance        Extremity/Trunk Assessment   Upper Extremity Assessment: Defer to OT evaluation           Lower Extremity Assessment: Generalized weakness         Communication   Communication: No difficulties  Cognition Arousal/Alertness: Awake/alert Behavior During Therapy: WFL for tasks assessed/performed Overall Cognitive Status: Within Functional Limits for tasks assessed  General Comments General comments (skin integrity, edema, etc.): WOC RN in room at end of therapy assessing pt.    Exercises        Assessment/Plan    PT Assessment Patient needs continued PT services  PT Diagnosis Generalized weakness;Abnormality of gait   PT Problem List Decreased strength;Decreased activity  tolerance;Decreased balance;Decreased mobility  PT Treatment Interventions DME instruction;Gait training;Functional mobility training;Therapeutic activities;Therapeutic exercise;Balance training;Patient/family education   PT Goals (Current goals can be found in the Care Plan section) Acute Rehab PT Goals Patient Stated Goal: Get better PT Goal Formulation: With patient Time For Goal Achievement: 05/15/15 Potential to Achieve Goals: Good    Frequency Min 3X/week   Barriers to discharge        Co-evaluation               End of Session   Activity Tolerance: Patient tolerated treatment well;No increased pain Patient left: in bed;with call bell/phone within reach;with nursing/sitter in room Nurse Communication: Mobility status         Time: 0762-2633 PT Time Calculation (min) (ACUTE ONLY): 12 min   Charges:   PT Evaluation $Initial PT Evaluation Tier I: 1 Procedure     PT G CodesEllouise Newer 05/01/2015, 12:46 PM Camille Bal [place], [place]

## 2015-05-01 NOTE — Consult Note (Addendum)
WOC follow-up: Eakin pouch was applied yesterday and is still intact with suction in place, however it is leaking over the lower abd scar tissue to the midline area at 6:00 o'clock. 300cc green drainage in the cannister from suction catheter. Pt was previously complaining of pain related to suction and some bleeding; suction catheter has slipped from edge of pouch and is in the middle of the wound. Wound appearance unchanged from previous consult.  Penrose drain sutured in place at 1:00 o'clock. Wound area is very painful to touch. Dr Georgette Dover at bedside to assess wound when pouch is off.  Scar tissue below wound remains constantly moist; there is no visible open wound but suspect there is a pinhole opening which is draining and causing constant moisture underneath pouch which is impairing the seal over the scar tissue location. Scar tissue is high risk for further breakdown related to constant moisture. Pt could benefit from medium Eakin pouch when discharged to home health so more surface area can be cut out and covered where lower pinhole opening appears to be located. This size pouch is not available in the [place]. Discussed home health needs with Santiago Glad, RN from [facility], who plans to order 2 boxes of medium pouches for use when patient is discharged. Applied piece of Duoderm over lower abd scar tissue to protect pouch from moisture, then small Eakin pouch with low wall suction attached to bag with a suction catheter to attempt to dry and heal skin and increase pouch wear time. Supplies at bedside and instructions provided for staff nurses if leakage occurs. Julien Girt MSN, RN, [place], [place], [place]

## 2015-05-01 NOTE — Progress Notes (Signed)
RT note: Patient refused to wear CPAP tonight. °

## 2015-05-02 LAB — URINE CULTURE

## 2015-05-02 LAB — GLUCOSE, CAPILLARY
GLUCOSE-CAPILLARY: 94 mg/dL (ref 65–99)
GLUCOSE-CAPILLARY: 90 mg/dL (ref 65–99)
GLUCOSE-CAPILLARY: 122 mg/dL — AB (ref 65–99)
Glucose-Capillary: 107 mg/dL — ABNORMAL HIGH (ref 65–99)

## 2015-05-02 MED ORDER — TRACE MINERALS CR-CU-MN-SE-ZN 10-1000-500-60 MCG/ML IV SOLN
INTRAVENOUS | Status: AC
  Administered 2015-05-02: 18:00:00 via INTRAVENOUS
  Filled 2015-05-02: qty 1700

## 2015-05-02 MED ORDER — FAT EMULSION 20 % IV EMUL
240.0000 mL | INTRAVENOUS | Status: AC
Start: 2015-05-02 — End: 2015-05-03
  Administered 2015-05-02: 240 mL via INTRAVENOUS
  Filled 2015-05-02 (×2): qty 250

## 2015-05-02 NOTE — Progress Notes (Signed)
PARENTERAL NUTRITION CONSULT NOTE - FOLLOW UP  Pharmacy Consult for TPN Indication: EC fistula  No Known Allergies  Patient Measurements: Height: 5\' 4"  (162.6 cm) Weight: 218 lb 14.7 oz (99.3 kg) IBW/kg (Calculated) : 54.7  Weight 99.3 kg  Vital Signs: Temp: 97.5 F (36.4 C) (08/20 0631) Temp Source: Oral (08/20 0631) BP: 122/59 mmHg (08/20 0631) Pulse Rate: 92 (08/20 0631) Intake/Output from previous day: 08/19 0701 - 08/20 0700 In: 3316.8 [P.O.:1000; I.V.:230; JOI:7867.6] Out: 2725 [Urine:2275; Drains:450] Intake/Output from this shift:   Labs:  Recent Labs  04/30/15 0501 05/01/15 0503  WBC 5.5 6.2  HGB 7.4* 7.4*  HCT 24.6* 24.3*  PLT 184 179    Recent Labs  04/30/15 0501 05/01/15 0503  NA 139 133*  K 4.0 4.6  CL 109 103  CO2 25 23  GLUCOSE 128* 128*  BUN 28* 34*  CREATININE 1.33* 1.43*  CALCIUM 9.0 8.6*  MG 1.8  --   PHOS 3.7  --   PROT 6.6  --   ALBUMIN 2.6*  --   AST 19  --   ALT 26  --   ALKPHOS 79  --   BILITOT 0.3  --    Estimated Creatinine Clearance: 39.5 mL/min (by C-G formula based on Cr of 1.43).   Recent Labs  05/01/15 2259 05/02/15 0628 05/02/15 0810  GLUCAP 120* 107* 94   Medications:  Infusions:  . sodium chloride 10 mL/hr at 04/30/15 1115   Insulin Requirements in the past 24 hours:  No SSI given - CBG range 99-123  Nutritional Goals: per RD on 8/16 Protein: 100-110 g  Kcal: 1700-1900  Fluid: 1.7-1.9 L  Current Nutrition:  Chronic TPN per [place] kcal, 85g protein - my appreciation to Carolynn Sayers for her documentation of home TPN.  Cyclic Clinimix E 7/20: 48ml/hr x 1 hour, 100 ml/hr x 16 hours, 50 ml/hr x 1 hour. 20% IVFE at 13.3 ml/hr x 18 hours. This will provide 1672 kcal and 84g protein. Boost TID ordered (patient refusing)  Clear liquid diet - 720 charted 8/18.  Only 10%  Meal intake on 8/19  Assessment:  Admit:  Surgeries/Procedures: s/p abdominal wound exploration with removal of  infected abdominal mesh 8/16 GI: EC fistula, abdominal wound. Drain in place with 167ml output 8/19 Endo: CBGs well-controlled on and off TPN, minimal SSI requirements Lytes: Lytes stable and wnl, CoCa 9.9. She has electrolytes in her TPN Renal: SCr 1.43 (back up slightly).  Normalized crcl ~ 76ml/min.  UOP ~ 225ml 8/18.  No  supplemental IV fluids in addition to her TPN at home. NS @ KVO Pulm: OSA Cards: VSS, metoprolol Hepatobil: LFTs ok.  TG 48, Prealbumin 18.2 - good baseline nutritional status. Albumin low at 2.6 Neuro: ok ID: no antibiotics. Afebrile and WBC wnl. Best Practices: lovenox, PPI IV TPN Access: PICC TPN start date: chronic  Plan:  Cycle Clinimix E 5/15: 72ml/hr x 1 hour, 160 ml/hr x 10 hours, 50 ml/hr x 1 hour. 20% IVFE at 20 ml/hr x 12 hours. Continue MVI and trace elements in TPN Adjust sensitive SSI and CBGs for 12 hour cycle TPN labs qMon/Thurs, Bmet in AM Continue IV fluids at [facility] Monitor volume status, cyclic TPN tolerance  Rober Minion, PharmD., MS Clinical Pharmacist Pager:  [phone] Thank you for allowing pharmacy to be part of this patients care team. 05/02/2015 8:45 AM

## 2015-05-02 NOTE — Progress Notes (Signed)
4 Days Post-Op  Subjective: Patient asking about going home. Feels about the same as yesterday   Objective: Vital signs in last 24 hours: Temp:  [97.5 F (36.4 C)-98.7 F (37.1 C)] 97.5 F (36.4 C) (08/20 0631) Pulse Rate:  [84-92] 92 (08/20 0631) Resp:  [16-18] 18 (08/20 0631) BP: (104-122)/(54-72) 122/59 mmHg (08/20 0631) SpO2:  [100 %] 100 % (08/20 0631) Last BM Date: 04/27/15  Intake/Output from previous day: 08/19 0701 - 08/20 0700 In: 3316.8 [P.O.:1000; I.V.:230; LFY:1017.5] Out: 2725 [Urine:2275; Drains:450] Intake/Output this shift:    Wound/ fistula with bilious drainage; penrose drain in place;  Suction catheter is controlling most of the drainage but some is seeping underneath the lower edge of the wafer.  This seems to happen more when she is sitting up. Lab Results:   Recent Labs  04/30/15 0501 05/01/15 0503  WBC 5.5 6.2  HGB 7.4* 7.4*  HCT 24.6* 24.3*  PLT 184 179   BMET  Recent Labs  04/30/15 0501 05/01/15 0503  NA 139 133*  K 4.0 4.6  CL 109 103  CO2 25 23  GLUCOSE 128* 128*  BUN 28* 34*  CREATININE 1.33* 1.43*  CALCIUM 9.0 8.6*   PT/INR No results for input(s): LABPROT, INR in the last 72 hours. ABG No results for input(s): PHART, HCO3 in the last 72 hours.  Invalid input(s): PCO2, PO2  Studies/Results: No results found.  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 Continue cyclic TNA   LOS: 5 days    Angad Nabers  K. 05/02/2015

## 2015-05-02 NOTE — Progress Notes (Signed)
Physical Therapy Treatment Patient Details Name: Marissa [name] MRN: [id number] DOB: 22-Jan-1941 Today's Date: 05/02/2015    History of Present Illness 74 y.o. female admitted with enterocutaneous fistual. s/p s/p Abdominal wound exploration.    PT Comments    Pt educated on posture today and given postural correction exercises for home. Pt c/o bilateral foot pain that limited ambulation slightly today and pt mentions that she has gout. PT will continue to follow.   Follow Up Recommendations  No PT follow up;Supervision - Intermittent     Equipment Recommendations  None recommended by PT    Recommendations for Other Services       Precautions / Restrictions Precautions Precautions: None Restrictions Weight Bearing Restrictions: No    Mobility  Bed Mobility Overal bed mobility: Modified Independent             General bed mobility comments: extra time  Transfers Overall transfer level: Needs assistance Equipment used: None Transfers: Sit to/from Stand Sit to Stand: Supervision         General transfer comment: pt needs grab bar in bathroom for down and up from toilet and increased time needed from all surfaces but no LOB noted or giving way of legs  Ambulation/Gait Ambulation/Gait assistance: Supervision Ambulation Distance (Feet): 450 Feet Assistive device: None Gait Pattern/deviations: Wide base of support;Trunk flexed;Step-through pattern Gait velocity: decreased Gait velocity interpretation:  (between 1.8 and 2.62 ft/sec) General Gait Details: sway noted with gait, pt declined ambulating full lap due to back and foot pain. She reports that she gets gout in her feet. Discussed posture during ambulation to counter back pain.    Stairs            Wheelchair Mobility    Modified Rankin (Stroke Patients Only)       Balance Overall balance assessment: Needs assistance Sitting-balance support: No upper extremity supported Sitting balance-Leahy  Scale: Normal     Standing balance support: No upper extremity supported Standing balance-Leahy Scale: Good Standing balance comment: pt able to reach to paper towels at sink as well as trash can on floor without LOB though all motions are slow and guarded                    Cognition Arousal/Alertness: Awake/alert Behavior During Therapy: WFL for tasks assessed/performed Overall Cognitive Status: Within Functional Limits for tasks assessed                      Exercises Other Exercises Other Exercises: performed postural correction exercises in standing.    General Comments        Pertinent Vitals/Pain Pain Assessment: Faces Faces Pain Scale: Hurts little more Pain Location: feet with ambulation and back after ambulation Pain Descriptors / Indicators: Aching Pain Intervention(s): Monitored during session;Repositioned    Home Living                      Prior Function            PT Goals (current goals can now be found in the care plan section) Acute Rehab PT Goals Patient Stated Goal: Get better PT Goal Formulation: With patient Time For Goal Achievement: 05/15/15 Potential to Achieve Goals: Good Progress towards PT goals: Progressing toward goals    Frequency  Min 3X/week    PT Plan Current plan remains appropriate    Co-evaluation             End of Session Equipment  Utilized During Treatment: Gait belt Activity Tolerance: Patient tolerated treatment well Patient left: in bed;with call bell/phone within reach     Time: 1440-1509 PT Time Calculation (min) (ACUTE ONLY): 29 min  Charges:  $Gait Training: 23-37 mins                    G Codes:     Leighton Roach, PT  Acute Rehab Services  [place], [place] 05/02/2015, 3:15 PM

## 2015-05-02 NOTE — Progress Notes (Signed)
PROGRESS NOTE  Arthur Aydelotte OFB:[id number] DOB: [birth year] DOA: 04/27/2015 PCP: [name], MD  Brief History 74 year old female patient with extensive past surgical history-left hemicolectomy with left transverse colon to sigmoid colon anastomosis/appendectomy/umbilical hernia repair (2003), repair of incarcerated ventral incisional hernia (2009), TAH with bilateral salpingo-oophorectomy with extensive enterolysis in [id number] complicated by enterocutaneous fistula (2013) which subsequently healed, June 2016 admitted again for large subcutaneous abscess and enterocutaneous fistula-managed with Eaken's pouch, bowel rest, TPN and Invanz, followed up by primary surgeon in office and due to concern for persisting underlying abscess patient admitted to hospital on 04/27/15 with plans for I & D. The patient ultimately went for I&D and explantation of infected abdominal mesh on 04/28/2015. She also has PMH of CAD, s/p OM BMS in August 2007 with ISR treated with a DES April 2009, Cath Done in August 2009 showed patency of the stent, Myoview October 2015 was low risk and echo at that time showed EF 55-60 percent. Cardiology evaluated her during recent hospitalization, determined that her cardiac status was stable and cleared her for laparotomy on 03/20/15. She also has PMH of HTN, stage III chronic kidney disease, chronic diastolic CHF, gout, OSA, GERD, HLD, diet controlled DM, DVT-not on anticoagulation secondary to recurrent diverticular and gastric bleeding and iron deficiency anemia. Assessment/Plan: enterocutaneous fistula Status post abdominal wound exploration/explantation of infected abdominal mesh per Dr.Tsuei 04/28/2015 -Patient is afebrile and hemodynamically stable without leukocytosis -Management per surgical team -Continue clear liquid diet/TPN per surgery -Eakins pouch  Generalized weakness -Check urine and urine culture--not suggestive of UTI -Likely due to deconditioning -PT  evaluation-->no home needs -no focal neuro signs  hypertension -Blood pressures initially soft and medications were held -Continue metoprolol tartrate and monitor -Reintroduce amlodipine low-dose 2.5 mg (does not need 10mg  dose) -BP controlled on metoprolol and amlodipine 2.5mg  -Do not plan to restart ARB (Benicar) in the setting of acute on chronic renal failure--will not restart after discharge  chronic kidney disease stage III -Baseline creatinine 4.4-3.1  chronic diastolic CHF Compensated.  -07/02/2014 echo EF 55-60%, no WMA  obstructive sleep apnea CPAP daily at bedtime. -Not compliant with CPAP and uses it up to 1-2 times per week  gastroesophageal reflux disease PPI.  well-controlled type 2 diabetes mellitus diet-controlled Hemoglobin A1c was 5.7 on 03/14/2015.  -CBGs controlled Continue sliding scale insulin. -Intermitten CBG elevation likely due to TPN, but essentially well controlled  history of DVT Not anticoagulation candidate secondary to recurrent diverticular and gastric bleeding--hemorrhagic gastritis noted on EGD 01/13/2015.  iron deficiency anemia -drop in Hgb may be dilutional but remains stable now -Transfuse to hemoglobin< 7 -Iron saturation 11% -04/30/15--Given dose of IV iron- and continue po niferex after d/c  #10 coronary artery disease Stable. Continue beta blocker.   Family Communication: Pt at beside Disposition Plan:per primary    Procedures/Studies: Ct Angio Chest Pe W/cm &/or Wo Cm  04/20/2015   CLINICAL DATA:  74 year old female with chest pain and elevated D-dimer.  EXAM: CT ANGIOGRAPHY CHEST WITH CONTRAST  TECHNIQUE: Multidetector CT imaging of the chest was performed using the standard protocol during bolus administration of intravenous contrast. Multiplanar CT image reconstructions and MIPs were obtained to evaluate the vascular anatomy.  CONTRAST:  75 mL of Omnipaque 350.  COMPARISON:  Chest CT 12/14/2010.  FINDINGS: Comment:  Study is limited by a combination of respiratory motion and beam hardening artifact from the patient's large body habitus.  Mediastinum/Lymph Nodes: While there is no  central, lobar or proximal segmental sized filling defect to suggest pulmonary embolism, smaller distal segmental and subsegmental sized emboli cannot be excluded secondary to respiratory motion and excessive image noise from the patient's large body habitus. Heart size is normal. There is no significant pericardial fluid, thickening or pericardial calcification. There is atherosclerosis of the thoracic aorta, the great vessels of the mediastinum and the coronary arteries, including calcified atherosclerotic plaque in the left anterior descending, left circumflex and right coronary arteries. No pathologically enlarged mediastinal or hilar lymph nodes. Esophagus is unremarkable in appearance. No axillary lymphadenopathy. Right upper extremity PICC with tip terminating in the mid superior vena cava.  Lungs/Pleura: No acute consolidative airspace disease. No pleural effusions. No suspicious appearing pulmonary nodules or masses.  Upper Abdomen: Status post cholecystectomy. Small calcified granulomas in the liver.  Musculoskeletal/Soft Tissues: There are no aggressive appearing lytic or blastic lesions noted in the visualized portions of the skeleton.  Review of the MIP images confirms the above findings.  IMPRESSION: 1. Although today's study is limited by excessive image noise and patient respiratory motion, there is no central, lobar or proximal segmental sized pulmonary embolism. Smaller distal segmental and subsegmental sized emboli cannot be entirely excluded. 2. Atherosclerosis, including three-vessel coronary artery disease. Assessment for potential risk factor modification, dietary therapy or pharmacologic therapy may be warranted, if clinically indicated. 3. Additional incidental findings, as above.   Electronically Signed   By: Vinnie Langton  M.D.   On: 04/20/2015 16:40   Ct Abdomen Pelvis W Contrast  04/14/2015   CLINICAL DATA:  Abdominal wall abscess in the umbilical region for 1 month. History of umbilical hernia repair. Partial left colectomy, tubal ligation, hysterectomy.  EXAM: CT ABDOMEN AND PELVIS WITH CONTRAST  TECHNIQUE: Multidetector CT imaging of the abdomen and pelvis was performed using the standard protocol following bolus administration of intravenous contrast.  CONTRAST:  115mL ISOVUE-300 IOPAMIDOL (ISOVUE-300) INJECTION 61%  COMPARISON:  03/20/2015 and 03/13/2015 similar exam, fistula injection 03/23/2015  FINDINGS: Lower chest:  Lung bases are clear.  Hepatobiliary: Cholecystectomy clips are noted. Liver is unremarkable.  Pancreas: Normal  Spleen: Normal  Adrenals/Urinary Tract: Adrenal glands are normal. 1.3 cm right upper renal pole cyst. No radiopaque renal, ureteral, or bladder calculus.  Stomach/Bowel: Small hiatal hernia reidentified. Evidence of periumbilical enterocutaneous fistula a reidentified involving adjacent loops of small bowel and containing contrast/ gas level. Overall, this collection measures slightly smaller when the internal cavity component is measured using similar technique as on the prior exam, currently 3.9 x 2.0 cm image 49, previously 5.6 x 2.3 cm at a similar anatomic level.  Colonic diverticuli noted without evidence for diverticulitis.  Vascular/Lymphatic: A small amount of stranding/haziness within the root of small bowel mesentery is stable. Moderate atheromatous aortic calcification without aneurysm. No lymphadenopathy.  Other: No free air  Musculoskeletal: No acute osseous abnormality. Lumbar spine disc degenerative change.  IMPRESSION: Slight decrease in size of cavitary component of periumbilical enterocutaneous fistula.   Electronically Signed   By: Conchita Paris M.D.   On: 04/14/2015 10:47   Dg Chest Port 1 View  04/27/2015   CLINICAL DATA:  Patient for revision of PICC line.  EXAM:  PORTABLE CHEST - 1 VIEW  COMPARISON:  Single view of the chest 04/20/2015.  FINDINGS: Right PICC remains in place with the tip projecting at the confluence of the brachiocephalic veins. The lungs are clear. Heart size is normal. No pneumothorax or pleural effusion. Mild asymmetric elevation of the right hemidiaphragm relative  to the left is noted.  IMPRESSION: Tip of right PICC projects at the confluence of the brachiocephalic veins. No acute disease.   Electronically Signed   By: Inge Rise M.D.   On: 04/27/2015 11:29   Dg Chest Port 1 View  04/20/2015   CLINICAL DATA:  Acute onset of shortness of breath and centralized chest pain. Initial encounter.  EXAM: PORTABLE CHEST - 1 VIEW  COMPARISON:  Chest radiograph performed 03/15/2015, and CTA of the chest performed earlier today at 2:38 p.m.  FINDINGS: The lungs are hypoexpanded. Mild left basilar atelectasis is noted. No definite pleural effusion or pneumothorax is seen. There is elevation of the right hemidiaphragm.  The cardiomediastinal silhouette is borderline normal in size. No acute osseous abnormalities are identified.  IMPRESSION: Lungs hypoexpanded. Mild left basilar atelectasis noted. Elevation of the right hemidiaphragm.   Electronically Signed   By: Garald Balding M.D.   On: 04/20/2015 22:23         Subjective: Patient denies fevers, chills, headache, chest pain, dyspnea, nausea, vomiting, diarrhea, abdominal pain, dysuria, hematuria   Objective: Filed Vitals:   05/01/15 0945 05/01/15 1349 05/01/15 2047 05/02/15 0631  BP: 127/74 107/72 104/54 122/59  Pulse:  84 89 92  Temp:  98.4 F (36.9 C) 98.7 F (37.1 C) 97.5 F (36.4 C)  TempSrc:  Oral Oral Oral  Resp:  16 18 18   Height:      Weight:      SpO2:  100% 100% 100%    Intake/Output Summary (Last 24 hours) at 05/02/15 1734 Last data filed at 05/02/15 1510  Gross per 24 hour  Intake 2766.83 ml  Output   1875 ml  Net 891.83 ml   Weight change:  Exam:   General:   Pt is alert, follows commands appropriately, not in acute distress  HEENT: No icterus, No thrush, No neck mass, [place]/AT  Cardiovascular: RRR, S1/S2, no rubs, no gallops  Respiratory: Diminished breath sounds but clear to auscultation  Abdomen: Soft/+BS, non tender, non distended, no guarding  Extremities: No edema, No lymphangitis, No petechiae, No rashes, no synovitis  Data Reviewed: Basic Metabolic Panel:  Recent Labs Lab 04/27/15 1608 04/28/15 1915 04/29/15 0500 04/30/15 0501 05/01/15 0503  NA 137 135 132* 139 133*  K 4.0 4.8 4.5 4.0 4.6  CL 104 104 103 109 103  CO2 26 21* 23 25 23   GLUCOSE 87 196* 147* 128* 128*  BUN 32* 28* 27* 28* 34*  CREATININE 1.48* 1.58* 1.41* 1.33* 1.43*  CALCIUM 9.3 9.2 8.7* 9.0 8.6*  MG  --   --  2.0 1.8  --   PHOS  --   --  3.2 3.7  --    Liver Function Tests:  Recent Labs Lab 04/27/15 1608 04/29/15 0500 04/30/15 0501  AST 21 37 19  ALT 16 40 26  ALKPHOS 78 94 79  BILITOT 0.3 0.5 0.3  PROT 7.4 7.1 6.6  ALBUMIN 2.9* 2.5* 2.6*   No results for input(s): LIPASE, AMYLASE in the last 168 hours. No results for input(s): AMMONIA in the last 168 hours. CBC:  Recent Labs Lab 04/27/15 1608 04/28/15 1915 04/29/15 0500 04/30/15 0501 05/01/15 0503  WBC 6.5 8.1 7.2 5.5 6.2  NEUTROABS  --   --  6.1  --   --   HGB 8.2* 9.2* 7.3* 7.4* 7.4*  HCT 27.2* 30.1* 24.5* 24.6* 24.3*  MCV 72.3* 74.1* 73.6* 73.2* 73.2*  PLT 244 196 190 184 179   Cardiac  Enzymes: No results for input(s): CKTOTAL, CKMB, CKMBINDEX, TROPONINI in the last 168 hours. BNP: Invalid input(s): POCBNP CBG:  Recent Labs Lab 05/01/15 1957 05/01/15 2259 05/02/15 0628 05/02/15 0810 05/02/15 1240  GLUCAP 120* 120* 107* 94 90    Recent Results (from the past 240 hour(s))  Surgical pcr screen     Status: None   Collection Time: 04/27/15  7:41 PM  Result Value Ref Range Status   MRSA, PCR NEGATIVE NEGATIVE Final   Staphylococcus aureus NEGATIVE NEGATIVE Final     Comment:        The Xpert SA Assay (FDA approved for NASAL specimens in patients over 38 years of age), is one component of a comprehensive surveillance program.  Test performance has been validated by [facility] for patients greater than or equal to 10 year old. It is not intended to diagnose infection nor to guide or monitor treatment.   Urine culture     Status: None   Collection Time: 04/30/15  9:20 PM  Result Value Ref Range Status   Specimen Description URINE, RANDOM  Final   Special Requests NONE  Final   Culture MULTIPLE SPECIES PRESENT, SUGGEST RECOLLECTION  Final   Report Status 05/02/2015 FINAL  Final     Scheduled Meds: . amLODipine  2.5 mg Oral Daily  . Chlorhexidine Gluconate Cloth  6 each Topical Q0600  . enoxaparin (LOVENOX) injection  30 mg Subcutaneous Q24H  . feeding supplement  1 Container Oral TID BM  . insulin aspart  0-9 Units Subcutaneous 4 times per day  . iron polysaccharides  150 mg Oral Daily  . metoprolol tartrate  25 mg Oral q morning - 10a  . mupirocin ointment   Nasal BID  . pantoprazole (PROTONIX) IV  40 mg Intravenous QHS  . cyanocobalamin  2,000 mcg Oral Daily   Continuous Infusions: . sodium chloride 10 mL/hr at 04/30/15 1115  . Marland KitchenTPN (CLINIMIX-E) Adult     And  . fat emulsion       Sharmeka Palmisano, DO  [facility] Pager [phone]  If 7PM-7AM, please contact night-coverage [email or web address] Password [email or web address] 05/02/2015, 5:34 PM   LOS: 5 days

## 2015-05-03 LAB — BASIC METABOLIC PANEL
Anion gap: 6 (ref 5–15)
BUN: 41 mg/dL — AB (ref 6–20)
CALCIUM: 8.8 mg/dL — AB (ref 8.9–10.3)
CHLORIDE: 103 mmol/L (ref 101–111)
CO2: 24 mmol/L (ref 22–32)
CREATININE: 1.34 mg/dL — AB (ref 0.44–1.00)
GFR, EST AFRICAN AMERICAN: 44 mL/min — AB (ref >60)
GFR, EST NON AFRICAN AMERICAN: 38 mL/min — AB (ref >60)
Glucose, Bld: 114 mg/dL — ABNORMAL HIGH (ref 65–99)
Potassium: 4.6 mmol/L (ref 3.5–5.1)
SODIUM: 133 mmol/L — AB (ref 135–145)

## 2015-05-03 LAB — GLUCOSE, CAPILLARY
GLUCOSE-CAPILLARY: 109 mg/dL — AB (ref 65–99)
GLUCOSE-CAPILLARY: 132 mg/dL — AB (ref 65–99)
Glucose-Capillary: 114 mg/dL — ABNORMAL HIGH (ref 65–99)
Glucose-Capillary: 125 mg/dL — ABNORMAL HIGH (ref 65–99)
Glucose-Capillary: 97 mg/dL (ref 65–99)

## 2015-05-03 LAB — URIC ACID: Uric Acid, Serum: 7.8 mg/dL — ABNORMAL HIGH (ref 2.3–6.6)

## 2015-05-03 LAB — MAGNESIUM: MAGNESIUM: 2 mg/dL (ref 1.7–2.4)

## 2015-05-03 MED ORDER — FAT EMULSION 20 % IV EMUL
240.0000 mL | INTRAVENOUS | Status: AC
  Administered 2015-05-03: 240 mL via INTRAVENOUS
  Filled 2015-05-03 (×2): qty 250

## 2015-05-03 MED ORDER — OXYCODONE HCL 5 MG PO TABS
5.0000 mg | ORAL_TABLET | Freq: Four times a day (QID) | ORAL | Status: DC | PRN
  Administered 2015-05-03 – 2015-05-21 (×7): 5 mg via ORAL
  Filled 2015-05-03 (×8): qty 1

## 2015-05-03 MED ORDER — TRACE MINERALS CR-CU-MN-SE-ZN 10-1000-500-60 MCG/ML IV SOLN
INTRAVENOUS | Status: AC
  Administered 2015-05-03: 18:00:00 via INTRAVENOUS
  Filled 2015-05-03: qty 1700

## 2015-05-03 MED ORDER — PREDNISONE 50 MG PO TABS
50.0000 mg | ORAL_TABLET | Freq: Once | ORAL | Status: AC
  Administered 2015-05-03: 50 mg via ORAL
  Filled 2015-05-03: qty 1

## 2015-05-03 NOTE — Progress Notes (Signed)
PARENTERAL NUTRITION CONSULT NOTE - FOLLOW UP  Pharmacy Consult for TPN Indication: EC fistula  No Known Allergies  Patient Measurements: Height: 5\' 4"  (162.6 cm) Weight: 218 lb 14.7 oz (99.3 kg) IBW/kg (Calculated) : 54.7  Weight 99.3 kg  Vital Signs: Temp: 98.3 F (36.8 C) (08/21 0646) Temp Source: Oral (08/21 0646) BP: 125/54 mmHg (08/21 0646) Pulse Rate: 95 (08/21 0646) Intake/Output from previous day: 08/20 0701 - 08/21 0700 In: 1932.3 [P.O.:620; I.V.:242.3; TPN:1070] Out: 2250 [Urine:2050; Drains:200] Intake/Output from this shift:   Labs:  Recent Labs  05/01/15 0503  WBC 6.2  HGB 7.4*  HCT 24.3*  PLT 179    Recent Labs  05/01/15 0503 05/03/15 0630  NA 133* 133*  K 4.6 4.6  CL 103 103  CO2 23 24  GLUCOSE 128* 114*  BUN 34* 41*  CREATININE 1.43* 1.34*  CALCIUM 8.6* 8.8*  MG  --  2.0   Estimated Creatinine Clearance: 42.2 mL/min (by C-G formula based on Cr of 1.34).   Recent Labs  05/03/15 0004 05/03/15 0008 05/03/15 0849  GLUCAP 97 125* 109*   Medications:  Infusions:  . sodium chloride 10 mL/hr at 04/30/15 1115  . Marland KitchenTPN (CLINIMIX-E) Adult Stopped (05/03/15 3664)   And  . fat emulsion 240 mL (05/02/15 1810)   Insulin Requirements in the past 24 hours:  No SSI given - CBG range 97-125  Nutritional Goals: per RD on 8/16 Protein: 100-110 g  Kcal: 1700-1900  Fluid: 1.7-1.9 L  Current Nutrition:  Chronic TPN per [place] kcal, 85g protein - my appreciation to Carolynn Sayers for her documentation of home TPN.  Cyclic Clinimix E 4/03: 3ml/hr x 1 hour, 100 ml/hr x 16 hours, 50 ml/hr x 1 hour. 20% IVFE at 13.3 ml/hr x 18 hours. This will provide 1672 kcal and 84g protein. Boost TID ordered (patient refusing)  Clear liquid diet - 720 charted 8/18.  Only 10%  Meal intake on 8/19  Assessment:  Surgeries/Procedures: s/p abdominal wound exploration with removal of infected abdominal mesh 8/16 GI: EC fistula, abdominal wound.  Drain in place with 561ml output 8/20(increased) Endo: CBGs well-controlled on and off TPN, minimal SSI requirements Lytes: Lytes stable and wnl, CoCa 9.9. She has electrolytes in her TPN Renal: SCr 1.34 Normalized crcl ~ 33ml/min.  UOP 0.68ml/kg/hr  No  supplemental IV fluids in addition to her TPN at home. NS @ KVO Pulm: OSA Cards: VSS, metoprolol Hepatobil: LFTs ok.  TG 48, Prealbumin 18.2 - good baseline nutritional status. Albumin low at 2.6 Neuro: ok ID: no antibiotics. Afebrile and WBC wnl. Best Practices: lovenox, PPI IV TPN Access: PICC TPN start date: chronic  Plan:  Cycle Clinimix E 5/15: 14ml/hr x 1 hour, 160 ml/hr x 10 hours, 50 ml/hr x 1 hour. 20% IVFE at 20 ml/hr x 12 hours. Continue MVI and trace elements in TPN TPN labs qMon/Thurs Continue IV fluids at [facility] Monitor volume status, cyclic TPN tolerance  Rober Minion, PharmD., MS Clinical Pharmacist Pager:  [phone] Thank you for allowing pharmacy to be part of this patients care team. 05/03/2015 9:06 AM

## 2015-05-03 NOTE — Progress Notes (Signed)
5 Days Post-Op  Subjective: C/o l ankle pain. No n/v. Has had gout before in L ankle  Objective: Vital signs in last 24 hours: Temp:  [97.9 F (36.6 C)-98.8 F (37.1 C)] 98.3 F (36.8 C) (08/21 0646) Pulse Rate:  [86-95] 95 (08/21 0646) Resp:  [16] 16 (08/21 0646) BP: (118-125)/(53-62) 125/54 mmHg (08/21 0646) SpO2:  [100 %] 100 % (08/21 0646) Last BM Date: 04/27/15  Intake/Output from previous day: 08/20 0701 - 08/21 0700 In: 1932.3 [P.O.:620; I.V.:242.3; TPN:1070] Out: 2250 [Urine:2050; Drains:200] Intake/Output this shift:    Alert, nad Soft, obese, eakins pouch intact with red rubber; about 500cc/24hr per nurse L anterolateral ankle - TTP. No edema  Lab Results:   Recent Labs  05/01/15 0503  WBC 6.2  HGB 7.4*  HCT 24.3*  PLT 179   BMET  Recent Labs  05/01/15 0503 05/03/15 0630  NA 133* 133*  K 4.6 4.6  CL 103 103  CO2 23 24  GLUCOSE 128* 114*  BUN 34* 41*  CREATININE 1.43* 1.34*  CALCIUM 8.6* 8.8*   PT/INR No results for input(s): LABPROT, INR in the last 72 hours. ABG No results for input(s): PHART, HCO3 in the last 72 hours.  Invalid input(s): PCO2, PO2  Studies/Results: No results found.  Anti-infectives: Anti-infectives    Start     Dose/Rate Route Frequency Ordered Stop   04/28/15 0000  cefOXitin (MEFOXIN) 1 g in dextrose 5 % 50 mL IVPB     1 g 100 mL/hr over 30 Minutes Intravenous  Once 04/27/15 1157 04/28/15 0859      Assessment/Plan: s/p Procedure(s): Abdominal Wound Explororation, Explantation of Abdominal Mesh (N/A)  Discussed with nurse need for strict i&o of EC fistula Clears TPN, wound care for EC fistula Colchicine and oxy for gout flare - will ask medicine if anything else needed Not ready for dc   Leighton Ruff. Redmond Pulling, MD, FACS General, Bariatric, & Minimally Invasive Surgery [facility], [place]   LOS: 6 days    Gayland Curry 05/03/2015

## 2015-05-03 NOTE — Progress Notes (Signed)
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 The patient ultimately went for I&D and explantation of infected abdominal mesh on 04/28/2015. She also has PMH of CAD, s/p OM BMS in August 2007 with ISR treated with a DES April 2009, Cath Done in August 2009 showed patency of the stent, Myoview October 2015 was low risk and echo at that time showed EF 55-60 percent. Cardiology evaluated her during recent hospitalization, determined that her cardiac status was stable and cleared her for laparotomy on 03/20/15. She also has PMH of HTN, stage III chronic kidney disease, chronic diastolic CHF, gout, OSA, GERD, HLD, diet controlled DM, DVT-not on anticoagulation secondary to recurrent diverticular and gastric bleeding and iron deficiency anemia. Assessment/Plan: enterocutaneous fistula Status post abdominal wound exploration/explantation of infected abdominal mesh per Dr.Tsuei 04/28/2015 -Patient is afebrile and hemodynamically stable without leukocytosis -Management per surgical team -Continue clear liquid diet/TPN per surgery -Eakins pouch  Generalized weakness -Check urine and urine culture--not suggestive of UTI -Likely due to deconditioning -PT  evaluation-->no home needs -no focal neuro signs   Left Ankle Pain -suspect gouty flare -uric acid -xray L-ankle -due to pt's CKD stage 3, hesitant to use cochicine -give prednisone 50mg  x 1 to see how she responds before giving additional dosing in the setting of recent abd surgery   hypertension -Blood pressures initially soft and medications were held -Continue metoprolol tartrate -Reintroduce amlodipine low-dose 2.5 mg (does not need 10mg  dose) -BP controlled on metoprolol and amlodipine 2.5mg  -Do not plan to restart ARB (Benicar) in the setting of acute on chronic renal failure--will not restart after discharge  chronic kidney disease stage III -Baseline creatinine 3.2-9.9  chronic diastolic CHF Compensated.  -07/02/2014 echo EF 55-60%, no WMA  obstructive sleep apnea CPAP daily at bedtime. -Not compliant with CPAP and uses it up to 1-2 times per week  gastroesophageal reflux disease PPI.  well-controlled type 2 diabetes mellitus diet-controlled Hemoglobin A1c was 5.7 on 03/14/2015.  -CBGs controlled Continue sliding scale insulin. -Intermitten CBG elevation likely due to TPN, but essentially well controlled  history of DVT Not anticoagulation candidate secondary to recurrent diverticular and gastric bleeding--hemorrhagic gastritis noted on EGD 01/13/2015.  iron deficiency anemia -drop in Hgb may be dilutional but remains stable now -Transfuse to hemoglobin< 7 -Iron saturation 11% -04/30/15--Given dose of IV iron- and continue po niferex after d/c  #10 coronary artery disease Stable. Continue beta blocker.   Family Communication: Pt at beside Disposition Plan:per primary     Procedures/Studies: Ct Angio Chest Pe W/cm &/or Wo Cm  04/20/2015   CLINICAL DATA:  74 year old female with chest pain and elevated D-dimer.  EXAM: CT ANGIOGRAPHY CHEST WITH CONTRAST  TECHNIQUE: Multidetector CT imaging of the chest was performed using the standard protocol during  bolus administration of intravenous contrast. Multiplanar CT image reconstructions and MIPs were obtained to evaluate the vascular  anatomy.  CONTRAST:  75 mL of Omnipaque 350.  COMPARISON:  Chest CT 12/14/2010.  FINDINGS: Comment: Study is limited by a combination of respiratory motion and beam hardening artifact from the patient's large body habitus.  Mediastinum/Lymph Nodes: While there is no central, lobar or proximal segmental sized filling defect to suggest pulmonary embolism, smaller distal segmental and subsegmental sized emboli cannot be excluded secondary to respiratory motion and excessive image noise from the patient's large body habitus. Heart size is normal. There is no significant pericardial fluid, thickening or pericardial calcification. There is atherosclerosis of the thoracic aorta, the great vessels of the mediastinum and the coronary arteries, including calcified atherosclerotic plaque in the left anterior descending, left circumflex and right coronary arteries. No pathologically enlarged mediastinal or hilar lymph nodes. Esophagus is unremarkable in appearance. No axillary lymphadenopathy. Right upper extremity PICC with tip terminating in the mid superior vena cava.  Lungs/Pleura: No acute consolidative airspace disease. No pleural effusions. No suspicious appearing pulmonary nodules or masses.  Upper Abdomen: Status post cholecystectomy. Small calcified granulomas in the liver.  Musculoskeletal/Soft Tissues: There are no aggressive appearing lytic or blastic lesions noted in the visualized portions of the skeleton.  Review of the MIP images confirms the above findings.  IMPRESSION: 1. Although today's study is limited by excessive image noise and patient respiratory motion, there is no central, lobar or proximal segmental sized pulmonary embolism. Smaller distal segmental and subsegmental sized emboli cannot be entirely excluded. 2. Atherosclerosis, including three-vessel coronary artery  disease. Assessment for potential risk factor modification, dietary therapy or pharmacologic therapy may be warranted, if clinically indicated. 3. Additional incidental findings, as above.   Electronically Signed   By: Vinnie Langton M.D.   On: 04/20/2015 16:40   Ct Abdomen Pelvis W Contrast  04/14/2015   CLINICAL DATA:  Abdominal wall abscess in the umbilical region for 1 month. History of umbilical hernia repair. Partial left colectomy, tubal ligation, hysterectomy.  EXAM: CT ABDOMEN AND PELVIS WITH CONTRAST  TECHNIQUE: Multidetector CT imaging of the abdomen and pelvis was performed using the standard protocol following bolus administration of intravenous contrast.  CONTRAST:  130mL ISOVUE-300 IOPAMIDOL (ISOVUE-300) INJECTION 61%  COMPARISON:  03/20/2015 and 03/13/2015 similar exam, fistula injection 03/23/2015  FINDINGS: Lower chest:  Lung bases are clear.  Hepatobiliary: Cholecystectomy clips are noted. Liver is unremarkable.  Pancreas: Normal  Spleen: Normal  Adrenals/Urinary Tract: Adrenal glands are normal. 1.3 cm right upper renal pole cyst. No radiopaque renal, ureteral, or bladder calculus.  Stomach/Bowel: Small hiatal hernia reidentified. Evidence of periumbilical enterocutaneous fistula a reidentified involving adjacent loops of small bowel and containing contrast/ gas level. Overall, this collection measures slightly smaller when the internal cavity component is measured using similar technique as on the prior exam, currently 3.9 x 2.0 cm image 49, previously 5.6 x 2.3 cm at a similar anatomic level.  Colonic diverticuli noted without evidence for diverticulitis.  Vascular/Lymphatic: A small amount of stranding/haziness within the root of small bowel mesentery is stable. Moderate atheromatous aortic calcification without aneurysm. No lymphadenopathy.  Other: No free air  Musculoskeletal: No acute osseous abnormality. Lumbar spine disc degenerative change.  IMPRESSION: Slight decrease in size of  cavitary component of periumbilical enterocutaneous fistula.   Electronically Signed   By: Conchita Paris M.D.   On: 04/14/2015 10:47   Dg Chest Port 1 View  04/27/2015   CLINICAL DATA:  Patient for revision of PICC line.  EXAM: PORTABLE CHEST - 1 VIEW  COMPARISON:  Single  view of the chest 04/20/2015.  FINDINGS: Right PICC remains in place with the tip projecting at the confluence of the brachiocephalic veins. The lungs are clear. Heart size is normal. No pneumothorax or pleural effusion. Mild asymmetric elevation of the right hemidiaphragm relative to the left is noted.  IMPRESSION: Tip of right PICC projects at the confluence of the brachiocephalic veins. No acute disease.   Electronically Signed   By: Inge Rise M.D.   On: 04/27/2015 11:29   Dg Chest Port 1 View  04/20/2015   CLINICAL DATA:  Acute onset of shortness of breath and centralized chest pain. Initial encounter.  EXAM: PORTABLE CHEST - 1 VIEW  COMPARISON:  Chest radiograph performed 03/15/2015, and CTA of the chest performed earlier today at 2:38 p.m.  FINDINGS: The lungs are hypoexpanded. Mild left basilar atelectasis is noted. No definite pleural effusion or pneumothorax is seen. There is elevation of the right hemidiaphragm.  The cardiomediastinal silhouette is borderline normal in size. No acute osseous abnormalities are identified.  IMPRESSION: Lungs hypoexpanded. Mild left basilar atelectasis noted. Elevation of the right hemidiaphragm.   Electronically Signed   By: Garald Balding M.D.   On: 04/20/2015 22:23         Subjective: Patient complains of left ankle pain. She states that this is similar to her previous gouty attacks. She had a gouty attack in January 2016. Denies any fevers, chills, chest pain, shortness breath, nausea, vomiting. She is passing flatus but no bowel movement.  Objective: Filed Vitals:   05/02/15 1420 05/02/15 2218 05/03/15 0646 05/03/15 1340  BP: 125/62 118/53 125/54 127/62  Pulse: 92 86 95 83    Temp: 97.9 F (36.6 C) 98.8 F (37.1 C) 98.3 F (36.8 C) 98 F (36.7 C)  TempSrc: Oral Oral Oral Oral  Resp: 16 16 16 14   Height:      Weight:      SpO2: 100% 100% 100% 100%    Intake/Output Summary (Last 24 hours) at 05/03/15 1739 Last data filed at 05/03/15 1530  Gross per 24 hour  Intake 1852.34 ml  Output   2050 ml  Net -197.66 ml   Weight change:  Exam:   General:  Pt is alert, follows commands appropriately, not in acute distress  HEENT: No icterus, No thrush, No neck mass, [place]/AT  Cardiovascular: RRR, S1/S2, no rubs, no gallops  Respiratory: Bibasilar crackles. No wheezing. Good air movement.  Abdomen: Soft/+BS, mild diffuse tenderness without any rebound., non distended, no guarding  Extremities: No edema, No lymphangitis, No petechiae, No rashes, no synovitis; mild left ankle edema with tenderness to palpation and warmth.  Data Reviewed: Basic Metabolic Panel:  Recent Labs Lab 04/28/15 1915 04/29/15 0500 04/30/15 0501 05/01/15 0503 05/03/15 0630  NA 135 132* 139 133* 133*  K 4.8 4.5 4.0 4.6 4.6  CL 104 103 109 103 103  CO2 21* 23 25 23 24   GLUCOSE 196* 147* 128* 128* 114*  BUN 28* 27* 28* 34* 41*  CREATININE 1.58* 1.41* 1.33* 1.43* 1.34*  CALCIUM 9.2 8.7* 9.0 8.6* 8.8*  MG  --  2.0 1.8  --  2.0  PHOS  --  3.2 3.7  --   --    Liver Function Tests:  Recent Labs Lab 04/27/15 1608 04/29/15 0500 04/30/15 0501  AST 21 37 19  ALT 16 40 26  ALKPHOS 78 94 79  BILITOT 0.3 0.5 0.3  PROT 7.4 7.1 6.6  ALBUMIN 2.9* 2.5* 2.6*   No results  for input(s): LIPASE, AMYLASE in the last 168 hours. No results for input(s): AMMONIA in the last 168 hours. CBC:  Recent Labs Lab 04/27/15 1608 04/28/15 1915 04/29/15 0500 04/30/15 0501 05/01/15 0503  WBC 6.5 8.1 7.2 5.5 6.2  NEUTROABS  --   --  6.1  --   --   HGB 8.2* 9.2* 7.3* 7.4* 7.4*  HCT 27.2* 30.1* 24.5* 24.6* 24.3*  MCV 72.3* 74.1* 73.6* 73.2* 73.2*  PLT 244 196 190 184 179   Cardiac  Enzymes: No results for input(s): CKTOTAL, CKMB, CKMBINDEX, TROPONINI in the last 168 hours. BNP: Invalid input(s): POCBNP CBG:  Recent Labs Lab 05/02/15 2009 05/03/15 0004 05/03/15 0008 05/03/15 0849 05/03/15 1234  GLUCAP 122* 97 125* 109* 114*    Recent Results (from the past 240 hour(s))  Surgical pcr screen     Status: None   Collection Time: 04/27/15  7:41 PM  Result Value Ref Range Status   MRSA, PCR NEGATIVE NEGATIVE Final   Staphylococcus aureus NEGATIVE NEGATIVE Final    Comment:        The Xpert SA Assay (FDA approved for NASAL specimens in patients over 29 years of age), is one component of a comprehensive surveillance program.  Test performance has been validated by [facility] for patients greater than or equal to 60 year old. It is not intended to diagnose infection nor to guide or monitor treatment.   Urine culture     Status: None   Collection Time: 04/30/15  9:20 PM  Result Value Ref Range Status   Specimen Description URINE, RANDOM  Final   Special Requests NONE  Final   Culture MULTIPLE SPECIES PRESENT, SUGGEST RECOLLECTION  Final   Report Status 05/02/2015 FINAL  Final     Scheduled Meds: . amLODipine  2.5 mg Oral Daily  . Chlorhexidine Gluconate Cloth  6 each Topical Q0600  . enoxaparin (LOVENOX) injection  30 mg Subcutaneous Q24H  . feeding supplement  1 Container Oral TID BM  . insulin aspart  0-9 Units Subcutaneous 4 times per day  . iron polysaccharides  150 mg Oral Daily  . metoprolol tartrate  25 mg Oral q morning - 10a  . mupirocin ointment   Nasal BID  . pantoprazole (PROTONIX) IV  40 mg Intravenous QHS  . predniSONE  50 mg Oral Once  . cyanocobalamin  2,000 mcg Oral Daily   Continuous Infusions: . sodium chloride 10 mL/hr at 04/30/15 1115  . Marland KitchenTPN (CLINIMIX-E) Adult Stopped (05/03/15 4496)   And  . fat emulsion 240 mL (05/02/15 1810)  . Marland KitchenTPN (CLINIMIX-E) Adult     And  . fat emulsion       Adrienna Karis, DO  [facility] Pager [phone]  If 7PM-7AM, please contact night-coverage [email or web address] Password [facility] 05/03/2015, 5:39 PM   LOS: 6 days

## 2015-05-03 NOTE — Progress Notes (Signed)
Patient refused to wear CPAP tonight  

## 2015-05-04 ENCOUNTER — Inpatient Hospital Stay (HOSPITAL_COMMUNITY): Payer: Medicare Other

## 2015-05-04 DIAGNOSIS — E875 Hyperkalemia: Secondary | ICD-10-CM | POA: Clinically undetermined

## 2015-05-04 LAB — GLUCOSE, CAPILLARY
GLUCOSE-CAPILLARY: 122 mg/dL — AB (ref 65–99)
Glucose-Capillary: 104 mg/dL — ABNORMAL HIGH (ref 65–99)
Glucose-Capillary: 136 mg/dL — ABNORMAL HIGH (ref 65–99)
Glucose-Capillary: 127 mg/dL — ABNORMAL HIGH (ref 65–99)

## 2015-05-04 LAB — TRIGLYCERIDES: TRIGLYCERIDES: 45 mg/dL (ref <150)

## 2015-05-04 LAB — COMPREHENSIVE METABOLIC PANEL
ALK PHOS: 99 U/L (ref 38–126)
ALT: 35 U/L (ref 14–54)
ANION GAP: 8 (ref 5–15)
AST: 24 U/L (ref 15–41)
Albumin: 2.7 g/dL — ABNORMAL LOW (ref 3.5–5.0)
BUN: 46 mg/dL — ABNORMAL HIGH (ref 6–20)
CALCIUM: 9.4 mg/dL (ref 8.9–10.3)
CO2: 20 mmol/L — AB (ref 22–32)
CREATININE: 1.4 mg/dL — AB (ref 0.44–1.00)
Chloride: 103 mmol/L (ref 101–111)
GFR, EST AFRICAN AMERICAN: 42 mL/min — AB (ref >60)
GFR, EST NON AFRICAN AMERICAN: 36 mL/min — AB (ref >60)
Glucose, Bld: 149 mg/dL — ABNORMAL HIGH (ref 65–99)
Potassium: 5.6 mmol/L — ABNORMAL HIGH (ref 3.5–5.1)
SODIUM: 131 mmol/L — AB (ref 135–145)
TOTAL PROTEIN: 7.5 g/dL (ref 6.5–8.1)
Total Bilirubin: 0.4 mg/dL (ref 0.3–1.2)

## 2015-05-04 LAB — DIFFERENTIAL
BASOS ABS: 0 10*3/uL (ref 0.0–0.1)
Basophils Relative: 0 % (ref 0–1)
EOS ABS: 0 10*3/uL (ref 0.0–0.7)
EOS PCT: 0 % (ref 0–5)
LYMPHS ABS: 0.6 10*3/uL — AB (ref 0.7–4.0)
Lymphocytes Relative: 7 % — ABNORMAL LOW (ref 12–46)
MONO ABS: 0.6 10*3/uL (ref 0.1–1.0)
Monocytes Relative: 7 % (ref 3–12)
NEUTROS PCT: 86 % — AB (ref 43–77)
Neutro Abs: 7.3 10*3/uL (ref 1.7–7.7)

## 2015-05-04 LAB — PREALBUMIN: PREALBUMIN: 21 mg/dL (ref 18–38)

## 2015-05-04 LAB — CBC
HCT: 25.9 % — ABNORMAL LOW (ref 36.0–46.0)
Hemoglobin: 8 g/dL — ABNORMAL LOW (ref 12.0–15.0)
MCH: 22.5 pg — ABNORMAL LOW (ref 26.0–34.0)
MCHC: 30.9 g/dL (ref 30.0–36.0)
MCV: 72.8 fL — ABNORMAL LOW (ref 78.0–100.0)
PLATELETS: 229 10*3/uL (ref 150–400)
RBC: 3.56 MIL/uL — ABNORMAL LOW (ref 3.87–5.11)
RDW: 15.7 % — AB (ref 11.5–15.5)
WBC: 8.5 10*3/uL (ref 4.0–10.5)

## 2015-05-04 LAB — MAGNESIUM: MAGNESIUM: 2 mg/dL (ref 1.7–2.4)

## 2015-05-04 LAB — PHOSPHORUS: PHOSPHORUS: 3.5 mg/dL (ref 2.5–4.6)

## 2015-05-04 MED ORDER — FAT EMULSION 20 % IV EMUL
240.0000 mL | INTRAVENOUS | Status: AC
  Administered 2015-05-04: 240 mL via INTRAVENOUS
  Filled 2015-05-04: qty 250

## 2015-05-04 MED ORDER — PREDNISONE 50 MG PO TABS
50.0000 mg | ORAL_TABLET | Freq: Every day | ORAL | Status: AC
  Administered 2015-05-04 – 2015-05-05 (×2): 50 mg via ORAL
  Filled 2015-05-04 (×2): qty 1

## 2015-05-04 MED ORDER — ENOXAPARIN SODIUM 40 MG/0.4ML ~~LOC~~ SOLN
40.0000 mg | SUBCUTANEOUS | Status: DC
  Administered 2015-05-04 – 2015-05-21 (×17): 40 mg via SUBCUTANEOUS
  Filled 2015-05-04 (×17): qty 0.4

## 2015-05-04 MED ORDER — TRACE MINERALS CR-CU-MN-SE-ZN 10-1000-500-60 MCG/ML IV SOLN
INTRAVENOUS | Status: AC
  Administered 2015-05-04: 18:00:00 via INTRAVENOUS
  Filled 2015-05-04: qty 1700

## 2015-05-04 MED ORDER — OCTREOTIDE ACETATE 50 MCG/ML IJ SOLN
50.0000 ug | Freq: Three times a day (TID) | INTRAMUSCULAR | Status: DC
  Administered 2015-05-04 – 2015-05-06 (×5): 50 ug via SUBCUTANEOUS
  Filled 2015-05-04 (×14): qty 1

## 2015-05-04 MED ORDER — SODIUM POLYSTYRENE SULFONATE 15 GM/60ML PO SUSP
15.0000 g | Freq: Once | ORAL | Status: AC
  Administered 2015-05-04: 15 g via ORAL
  Filled 2015-05-04: qty 60

## 2015-05-04 NOTE — Progress Notes (Signed)
[facility] Progress Note  6 Days Post-Op  Subjective: Pt feels better today, no pain.  No N/V, tolerates clears.  Bilious output out of midline wound.  Mobilizing well.  Now has gouty flair in left ankle.  She says her big toes, right ankle, and left thumb are also bothering her.    Objective: Vital signs in last 24 hours: Temp:  [97.7 F (36.5 C)-98.4 F (36.9 C)] 97.7 F (36.5 C) (08/22 0649) Pulse Rate:  [83-91] 89 (08/22 0649) Resp:  [14-16] 16 (08/22 0649) BP: (122-145)/(55-67) 122/67 mmHg (08/22 0649) SpO2:  [100 %] 100 % (08/22 0649) Last BM Date: 04/27/15  Intake/Output from previous day: 08/21 0701 - 08/22 0700 In: 1828.5 [P.O.:590; TPN:1238.5] Out: 3350 [Urine:2950; Drains:400] Intake/Output this shift:    PE: Gen:  Alert, NAD, pleasant Abd: Soft, ND, mildly tender around suction tubing in wound, open visible wound with bilious drainage (425mL output), +BS, no HSM Ext:  Left ankle warmth with tenderness, same to right ankle.   Lab Results:   Recent Labs  05/04/15 0554  WBC 8.5  HGB 8.0*  HCT 25.9*  PLT 229   BMET  Recent Labs  05/03/15 0630 05/04/15 0554  NA 133* 131*  K 4.6 5.6*  CL 103 103  CO2 24 20*  GLUCOSE 114* 149*  BUN 41* 46*  CREATININE 1.34* 1.40*  CALCIUM 8.8* 9.4   PT/INR No results for input(s): LABPROT, INR in the last 72 hours. CMP     Component Value Date/Time   NA 131* 05/04/2015 0554   K 5.6* 05/04/2015 0554   CL 103 05/04/2015 0554   CO2 20* 05/04/2015 0554   GLUCOSE 149* 05/04/2015 0554   BUN 46* 05/04/2015 0554   CREATININE 1.40* 05/04/2015 0554   CREATININE 1.64* 06/27/2014 1647   CALCIUM 9.4 05/04/2015 0554   PROT 7.5 05/04/2015 0554   ALBUMIN 2.7* 05/04/2015 0554   AST 24 05/04/2015 0554   ALT 35 05/04/2015 0554   ALKPHOS 99 05/04/2015 0554   BILITOT 0.4 05/04/2015 0554   GFRNONAA 36* 05/04/2015 0554   GFRAA 42* 05/04/2015 0554   Lipase     Component Value Date/Time   LIPASE 27  12/26/2014 1815       Studies/Results: Dg Ankle 2 Views Left  05/04/2015   CLINICAL DATA:  Left ankle pain.  EXAM: LEFT ANKLE - 2 VIEW  COMPARISON:  None.  FINDINGS: Degenerative changes in the tibiotalar joint as well as in the visualized intertarsal joints. Plantar and Achilles spurs of the calcaneus. Mild soft tissue swelling. No evidence of acute fracture or dislocation.  IMPRESSION: Degenerative changes in the left ankle and foot.   Electronically Signed   By: Lucienne Capers M.D.   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 -Ambulate and IS -SCD's and lovenox -Appreciate medicines help  Gout flare (left ankle) - per medicine, prednisone, colchicine ABL anemia - Hgb up to 8.0 Hyperkalemia - 5.6 today, got kayexalate this am, not on any supplements CKD Stage 3 - 1.40 - at baseline Disp - Continue inpatient today, await improvement in drain output so we can discontinue suction    LOS: 7 days    [name] 05/04/2015, 8:18 AM Pager: [phone]

## 2015-05-04 NOTE — Progress Notes (Signed)
PARENTERAL NUTRITION CONSULT NOTE - FOLLOW UP  Pharmacy Consult for TPN Indication: EC fistula  No Known Allergies  Patient Measurements: Height: 5\' 4"  (162.6 cm) Weight: 218 lb 14.7 oz (99.3 kg) IBW/kg (Calculated) : 54.7  Weight 99.3 kg  Vital Signs: Temp: 97.7 F (36.5 C) (08/22 0649) Temp Source: Oral (08/22 0649) BP: 122/67 mmHg (08/22 0649) Pulse Rate: 89 (08/22 0649) Intake/Output from previous day: 08/21 0701 - 08/22 0700 In: 1828.5 [P.O.:590; TPN:1238.5] Out: 3350 [Urine:2950; Drains:400] Intake/Output from this shift:   Labs:  Recent Labs  05/04/15 0554  WBC 8.5  HGB 8.0*  HCT 25.9*  PLT 229    Recent Labs  05/03/15 0630 05/04/15 0554  NA 133* 131*  K 4.6 5.6*  CL 103 103  CO2 24 20*  GLUCOSE 114* 149*  BUN 41* 46*  CREATININE 1.34* 1.40*  CALCIUM 8.8* 9.4  MG 2.0 2.0  PHOS  --  3.5  PROT  --  7.5  ALBUMIN  --  2.7*  AST  --  24  ALT  --  35  ALKPHOS  --  99  BILITOT  --  0.4  PREALBUMIN  --  21.0  TRIG  --  45   Estimated Creatinine Clearance: 40.3 mL/min (by C-G formula based on Cr of 1.4).   Recent Labs  05/03/15 1234 05/03/15 2234 05/04/15 0741  GLUCAP 114* 132* 104*   Medications:  Infusions:  . sodium chloride 10 mL/hr at 04/30/15 1115  . Marland KitchenTPN (CLINIMIX-E) Adult 50 mL/hr at 05/03/15 1749   And  . fat emulsion 240 mL (05/03/15 1748)   Insulin Requirements in the past 24 hours:  No SSI given - CBG range 97-125  Nutritional Goals: per RD on 8/16 Protein: 100-110 g  Kcal: 1700-1900  Fluid: 1.7-1.9 L  Current Nutrition:  Chronic TPN per [place] kcal, 85g protein - my appreciation to Carolynn Sayers for her documentation of home TPN.  Cyclic Clinimix E 1/61: 43ml/hr x 1 hour, 160 ml/hr x 10 hours, 50 ml/hr x 1 hour. 20% IVFE at 20 ml/hr x 12 hours. This will provide 1672 kcal and 84g protein. Boost TID ordered (patient refusing)  Clear liquid diet  Assessment:  Surgeries/Procedures: s/p abdominal  wound exploration with removal of infected abdominal mesh 8/16  GI: EC fistula, abdominal wound. Drain in place with 283ml output 8/21  Endo: CBGs well-controlled on and off TPN, minimal SSI requirements  Lytes:Lytes wnl exc K 5.6, CoCa 10.1. Phos and Mg ok.   Renal: SCr 1.40, Normalized crcl ~59ml/min.  UOP 1.2 ml/kg/hr.  No supplemental IV fluids in addition to her TPN at home. NS @ KVO  Pulm: OSA  Cards: VSS, metoprolol  Hepatobil: LFTs ok. TG 45, Tbili 0.4. Prealbumin 21. Albumin low at 2.7  Neuro: ok  ID: no antibiotics. Afebrile and WBC wnl.  Best Practices: lovenox, PPI IV  TPN Access: PICC TPN start date: chronic  Plan:  Cycle Clinimix E 5/15: 37ml/hr x 1 hour, 160 ml/hr x 10 hours, 50 ml/hr x 1 hour. 20% IVFE at 20 ml/hr x 12 hours. Continue MVI and trace elements in TPN Continue IV fluids at [facility] Monitor volume status, cyclic TPN tolerance F/U Bmet to check K  Elenor Quinones, PharmD Clinical Pharmacist Pager [phone] 05/04/2015 8:47 AM

## 2015-05-04 NOTE — Progress Notes (Signed)
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 The patient ultimately went for I&D and explantation of infected abdominal mesh on 04/28/2015. She also has PMH of CAD, s/p OM BMS in August 2007 with ISR treated with a DES April 2009, Cath Done in August 2009 showed patency of the stent, Myoview October 2015 was low risk and echo at that time showed EF 55-60 percent. Cardiology evaluated her during recent hospitalization, determined that her cardiac status was stable and cleared her for laparotomy on 03/20/15. She also has PMH of HTN, stage III chronic kidney disease, chronic diastolic CHF, gout, OSA, GERD, HLD, diet controlled DM, DVT-not on anticoagulation secondary to recurrent diverticular and gastric bleeding and iron deficiency anemia. Assessment/Plan: enterocutaneous fistula Status post abdominal wound exploration/explantation of infected abdominal mesh per Dr.Tsuei 04/28/2015 -Patient is afebrile and hemodynamically stable without leukocytosis -Management per surgical team -Continue clear liquid diet/TPN per surgery -Eakins pouch  Generalized weakness -Check urine and urine culture--not suggestive of UTI -Likely due to deconditioning -PT  evaluation-->no home needs -no focal neuro signs  Left Ankle Pain/Acute Gouty Flare -suspect gouty flare -uric acid--7.8 -xray L-ankle-->DJD -due to pt's CKD stage 3, hesitant to use cochicine -prednisone 50 mg daily x 3 days; hesitant to use longer in setting of open abdominal wound   Hyperkalemia -Kayexalate 1 -Repeat BMP in the morning -Continue to adjust TPN slightly.  hypertension -Blood pressures initially soft and medications were held -Continue metoprolol tartrate -Reintroduce amlodipine low-dose 2.5 mg (does not need 10mg  dose) -BP controlled on metoprolol and amlodipine 2.5mg  -Do not plan to restart ARB (Benicar) in the setting of acute on chronic renal failure--will not restart after discharge  chronic kidney disease stage III -Baseline creatinine 8.2-9.9  chronic diastolic CHF Compensated.  -07/02/2014 echo EF 55-60%, no WMA  obstructive sleep apnea CPAP daily at bedtime. -Not compliant with CPAP and uses it up to 1-2 times per week  gastroesophageal reflux disease PPI.  well-controlled type 2 diabetes mellitus diet-controlled Hemoglobin A1c was 5.7 on 03/14/2015.  -CBGs controlled Continue sliding scale insulin. -Intermitten CBG elevation likely due to TPN, but essentially well controlled  history of DVT Not anticoagulation candidate secondary to recurrent diverticular and gastric bleeding--hemorrhagic gastritis noted on EGD 01/13/2015.  iron deficiency anemia -drop in Hgb may be dilutional but remains stable now -Transfuse to hemoglobin< 7 -Iron saturation 11% -04/30/15--Given dose of IV iron- and continue po niferex after d/c  #10 coronary artery disease Stable. Continue beta blocker.   Family Communication: Pt at beside Disposition Plan:per primary        Procedures/Studies: Dg Ankle 2 Views Left  05/04/2015   CLINICAL DATA:  Left ankle pain.  EXAM: LEFT ANKLE - 2 VIEW  COMPARISON:  None.  FINDINGS: Degenerative changes in the  tibiotalar joint as well as in the visualized intertarsal joints. Plantar and Achilles spurs of the calcaneus.  Mild soft tissue swelling. No evidence of acute fracture or dislocation.  IMPRESSION: Degenerative changes in the left ankle and foot.   Electronically Signed   By: Lucienne Capers M.D.   On: 05/04/2015 02:57   Ct Angio Chest Pe W/cm &/or Wo Cm  04/20/2015   CLINICAL DATA:  74 year old female with chest pain and elevated D-dimer.  EXAM: CT ANGIOGRAPHY CHEST WITH CONTRAST  TECHNIQUE: Multidetector CT imaging of the chest was performed using the standard protocol during bolus administration of intravenous contrast. Multiplanar CT image reconstructions and MIPs were obtained to evaluate the vascular anatomy.  CONTRAST:  75 mL of Omnipaque 350.  COMPARISON:  Chest CT 12/14/2010.  FINDINGS: Comment: Study is limited by a combination of respiratory motion and beam hardening artifact from the patient's large body habitus.  Mediastinum/Lymph Nodes: While there is no central, lobar or proximal segmental sized filling defect to suggest pulmonary embolism, smaller distal segmental and subsegmental sized emboli cannot be excluded secondary to respiratory motion and excessive image noise from the patient's large body habitus. Heart size is normal. There is no significant pericardial fluid, thickening or pericardial calcification. There is atherosclerosis of the thoracic aorta, the great vessels of the mediastinum and the coronary arteries, including calcified atherosclerotic plaque in the left anterior descending, left circumflex and right coronary arteries. No pathologically enlarged mediastinal or hilar lymph nodes. Esophagus is unremarkable in appearance. No axillary lymphadenopathy. Right upper extremity PICC with tip terminating in the mid superior vena cava.  Lungs/Pleura: No acute consolidative airspace disease. No pleural effusions. No suspicious appearing pulmonary nodules or masses.  Upper Abdomen: Status  post cholecystectomy. Small calcified granulomas in the liver.  Musculoskeletal/Soft Tissues: There are no aggressive appearing lytic or blastic lesions noted in the visualized portions of the skeleton.  Review of the MIP images confirms the above findings.  IMPRESSION: 1. Although today's study is limited by excessive image noise and patient respiratory motion, there is no central, lobar or proximal segmental sized pulmonary embolism. Smaller distal segmental and subsegmental sized emboli cannot be entirely excluded. 2. Atherosclerosis, including three-vessel coronary artery disease. Assessment for potential risk factor modification, dietary therapy or pharmacologic therapy may be warranted, if clinically indicated. 3. Additional incidental findings, as above.   Electronically Signed   By: Vinnie Langton M.D.   On: 04/20/2015 16:40   Ct Abdomen Pelvis W Contrast  04/14/2015   CLINICAL DATA:  Abdominal wall abscess in the umbilical region for 1 month. History of umbilical hernia repair. Partial left colectomy, tubal ligation, hysterectomy.  EXAM: CT ABDOMEN AND PELVIS WITH CONTRAST  TECHNIQUE: Multidetector CT imaging of the abdomen and pelvis was performed using the standard protocol following bolus administration of intravenous contrast.  CONTRAST:  150mL ISOVUE-300 IOPAMIDOL (ISOVUE-300) INJECTION 61%  COMPARISON:  03/20/2015 and 03/13/2015 similar exam, fistula injection 03/23/2015  FINDINGS: Lower chest:  Lung bases are clear.  Hepatobiliary: Cholecystectomy clips are noted. Liver is unremarkable.  Pancreas: Normal  Spleen: Normal  Adrenals/Urinary Tract: Adrenal glands are normal. 1.3 cm right upper renal pole cyst. No radiopaque renal, ureteral, or bladder calculus.  Stomach/Bowel: Small hiatal hernia reidentified. Evidence of periumbilical enterocutaneous fistula a reidentified involving adjacent loops of small bowel and containing contrast/ gas level. Overall, this collection measures slightly smaller  when the internal cavity component is measured using similar technique as on the prior exam, currently 3.9 x 2.0 cm image 49, previously 5.6 x 2.3 cm at a similar anatomic level.  Colonic diverticuli noted without evidence for  diverticulitis.  Vascular/Lymphatic: A small amount of stranding/haziness within the root of small bowel mesentery is stable. Moderate atheromatous aortic calcification without aneurysm. No lymphadenopathy.  Other: No free air  Musculoskeletal: No acute osseous abnormality. Lumbar spine disc degenerative change.  IMPRESSION: Slight decrease in size of cavitary component of periumbilical enterocutaneous fistula.   Electronically Signed   By: Conchita Paris M.D.   On: 04/14/2015 10:47   Dg Chest Port 1 View  04/27/2015   CLINICAL DATA:  Patient for revision of PICC line.  EXAM: PORTABLE CHEST - 1 VIEW  COMPARISON:  Single view of the chest 04/20/2015.  FINDINGS: Right PICC remains in place with the tip projecting at the confluence of the brachiocephalic veins. The lungs are clear. Heart size is normal. No pneumothorax or pleural effusion. Mild asymmetric elevation of the right hemidiaphragm relative to the left is noted.  IMPRESSION: Tip of right PICC projects at the confluence of the brachiocephalic veins. No acute disease.   Electronically Signed   By: Inge Rise M.D.   On: 04/27/2015 11:29   Dg Chest Port 1 View  04/20/2015   CLINICAL DATA:  Acute onset of shortness of breath and centralized chest pain. Initial encounter.  EXAM: PORTABLE CHEST - 1 VIEW  COMPARISON:  Chest radiograph performed 03/15/2015, and CTA of the chest performed earlier today at 2:38 p.m.  FINDINGS: The lungs are hypoexpanded. Mild left basilar atelectasis is noted. No definite pleural effusion or pneumothorax is seen. There is elevation of the right hemidiaphragm.  The cardiomediastinal silhouette is borderline normal in size. No acute osseous abnormalities are identified.  IMPRESSION: Lungs hypoexpanded.  Mild left basilar atelectasis noted. Elevation of the right hemidiaphragm.   Electronically Signed   By: Garald Balding M.D.   On: 04/20/2015 22:23         Subjective: Left ankle feeling better. Denies any fevers, chills, chest pain which was present, vomiting, diarrhea, dysuria, hematuria. No rashes.  Objective: Filed Vitals:   05/03/15 1340 05/03/15 2242 05/04/15 0649 05/04/15 1343  BP: 127/62 145/55 122/67 116/62  Pulse: 83 91 89 77  Temp: 98 F (36.7 C) 98.4 F (36.9 C) 97.7 F (36.5 C) 97.7 F (36.5 C)  TempSrc: Oral Oral Oral Oral  Resp: 14 16 16 16   Height:      Weight:      SpO2: 100% 100% 100% 100%    Intake/Output Summary (Last 24 hours) at 05/04/15 1815 Last data filed at 05/04/15 1549  Gross per 24 hour  Intake 1658.49 ml  Output   3750 ml  Net -2091.51 ml   Weight change:  Exam:   General:  Pt is alert, follows commands appropriately, not in acute distress  HEENT: No icterus, No thrush, No neck mass, [place]/AT  Cardiovascular: RRR, S1/S2, no rubs, no gallops  Respiratory: CTA bilaterally, no wheezing, no crackles, no rhonchi  Abdomen: Soft/+BS, non tender, non distended, no guarding; no hepatosplenomegaly  Extremities: No edema, No lymphangitis, No petechiae, No rashes, no synovitis; left ankle without any synovitis or erythema.; No cyanosis or clubbing.    Data Reviewed: Basic Metabolic Panel:  Recent Labs Lab 04/29/15 0500 04/30/15 0501 05/01/15 0503 05/03/15 0630 05/04/15 0554  NA 132* 139 133* 133* 131*  K 4.5 4.0 4.6 4.6 5.6*  CL 103 109 103 103 103  CO2 23 25 23 24  20*  GLUCOSE 147* 128* 128* 114* 149*  BUN 27* 28* 34* 41* 46*  CREATININE 1.41* 1.33* 1.43* 1.34* 1.40*  CALCIUM  8.7* 9.0 8.6* 8.8* 9.4  MG 2.0 1.8  --  2.0 2.0  PHOS 3.2 3.7  --   --  3.5   Liver Function Tests:  Recent Labs Lab 04/29/15 0500 04/30/15 0501 05/04/15 0554  AST 37 19 24  ALT 40 26 35  ALKPHOS 94 79 99  BILITOT 0.5 0.3 0.4  PROT 7.1 6.6 7.5    ALBUMIN 2.5* 2.6* 2.7*   No results for input(s): LIPASE, AMYLASE in the last 168 hours. No results for input(s): AMMONIA in the last 168 hours. CBC:  Recent Labs Lab 04/28/15 1915 04/29/15 0500 04/30/15 0501 05/01/15 0503 05/04/15 0554  WBC 8.1 7.2 5.5 6.2 8.5  NEUTROABS  --  6.1  --   --  7.3  HGB 9.2* 7.3* 7.4* 7.4* 8.0*  HCT 30.1* 24.5* 24.6* 24.3* 25.9*  MCV 74.1* 73.6* 73.2* 73.2* 72.8*  PLT 196 190 184 179 229   Cardiac Enzymes: No results for input(s): CKTOTAL, CKMB, CKMBINDEX, TROPONINI in the last 168 hours. BNP: Invalid input(s): POCBNP CBG:  Recent Labs Lab 05/03/15 1234 05/03/15 2234 05/04/15 0741 05/04/15 1455 05/04/15 1802  GLUCAP 114* 132* 104* 136* 127*    Recent Results (from the past 240 hour(s))  Surgical pcr screen     Status: None   Collection Time: 04/27/15  7:41 PM  Result Value Ref Range Status   MRSA, PCR NEGATIVE NEGATIVE Final   Staphylococcus aureus NEGATIVE NEGATIVE Final    Comment:        The Xpert SA Assay (FDA approved for NASAL specimens in patients over 57 years of age), is one component of a comprehensive surveillance program.  Test performance has been validated by [facility] for patients greater than or equal to 53 year old. It is not intended to diagnose infection nor to guide or monitor treatment.   Urine culture     Status: None   Collection Time: 04/30/15  9:20 PM  Result Value Ref Range Status   Specimen Description URINE, RANDOM  Final   Special Requests NONE  Final   Culture MULTIPLE SPECIES PRESENT, SUGGEST RECOLLECTION  Final   Report Status 05/02/2015 FINAL  Final     Scheduled Meds: . amLODipine  2.5 mg Oral Daily  . Chlorhexidine Gluconate Cloth  6 each Topical Q0600  . enoxaparin (LOVENOX) injection  40 mg Subcutaneous Q24H  . feeding supplement  1 Container Oral TID BM  . insulin aspart  0-9 Units Subcutaneous 4 times per day  . iron polysaccharides  150 mg Oral Daily  . metoprolol tartrate   25 mg Oral q morning - 10a  . mupirocin ointment   Nasal BID  . octreotide  50 mcg Subcutaneous 3 times per day  . pantoprazole (PROTONIX) IV  40 mg Intravenous QHS  . predniSONE  50 mg Oral Q breakfast  . cyanocobalamin  2,000 mcg Oral Daily   Continuous Infusions: . sodium chloride 10 mL/hr at 04/30/15 1115  . TPN (CLINIMIX) Adult without lytes     And  . fat emulsion 240 mL (05/04/15 1807)     Takeru Bose, DO  [facility] Pager [phone]  If 7PM-7AM, please contact night-coverage [email or web address] Password [email or web address] 05/04/2015, 6:15 PM   LOS: 7 days

## 2015-05-04 NOTE — Progress Notes (Signed)
Physical Therapy Treatment Patient Details Name: Marissa [name] MRN: [id number] DOB: July 17, 1941 Today's Date: 05/04/2015    History of Present Illness 74 y.o. female admitted with enterocutaneous fistual. s/p s/p Abdominal wound exploration.    PT Comments    Pt c/o gout pain in L ankle, but still able to ambulate > 350'.  Pt wanted to use RW initially to decrease L gout pain, but then arms became tired and didn't want to use RW anymore. Did well despite gout pain.  Follow Up Recommendations  No PT follow up;Supervision - Intermittent     Equipment Recommendations  None recommended by PT    Recommendations for Other Services       Precautions / Restrictions Precautions Precautions: None    Mobility  Bed Mobility Overal bed mobility: Modified Independent             General bed mobility comments: extra time  Transfers Overall transfer level: Needs assistance Equipment used: Rolling walker (2 wheeled) Transfers: Sit to/from Stand Sit to Stand: Supervision         General transfer comment: Offered RW due to L ankle gout pain.  Pt did want to use it for standing and for gait.  Ambulation/Gait Ambulation/Gait assistance: Supervision Ambulation Distance (Feet): 370 Feet Assistive device: Rolling walker (2 wheeled);None Gait Pattern/deviations: Step-through pattern;Wide base of support Gait velocity: decreased   General Gait Details: Pt wanted to use RW initially due to L ankle gout pain. Later in gait, pt states pain had moved from her L ankle to her arms and stopped using RW with gait.     Stairs            Wheelchair Mobility    Modified Rankin (Stroke Patients Only)       Balance     Sitting balance-Leahy Scale: Normal       Standing balance-Leahy Scale: Fair Standing balance comment: Pt's balance decreased today due to gout pain.                    Cognition Arousal/Alertness: Awake/alert Behavior During Therapy: WFL for tasks  assessed/performed Overall Cognitive Status: Within Functional Limits for tasks assessed                      Exercises      General Comments General comments (skin integrity, edema, etc.): Nursing disconnected drain tube for gait.      Pertinent Vitals/Pain Faces Pain Scale: Hurts even more Pain Location: L ankle Pain Descriptors / Indicators: Aching Pain Intervention(s): Monitored during session;Repositioned    Home Living                      Prior Function            PT Goals (current goals can now be found in the care plan section) Acute Rehab PT Goals Patient Stated Goal: Get better PT Goal Formulation: With patient Time For Goal Achievement: 05/15/15 Potential to Achieve Goals: Good Progress towards PT goals: Progressing toward goals    Frequency  Min 3X/week    PT Plan Current plan remains appropriate    Co-evaluation             End of Session Equipment Utilized During Treatment: Gait belt Activity Tolerance: Patient tolerated treatment well Patient left: in bed;with call bell/phone within reach     Time: 1125-1148 PT Time Calculation (min) (ACUTE ONLY): 23 min  Charges:  $Gait Training: 23-37 mins  G Codes:      Jorje Vanatta LUBECK 05/04/2015, 1:10 PM

## 2015-05-04 NOTE — Progress Notes (Signed)
RT Note: RT spoke with patient about wearing CPAP and the patient stated she didn't want to wear tonight. RT advised patient that if she changed her mind to give respiratory a call. RT will continue to monitor.

## 2015-05-04 NOTE — Progress Notes (Signed)
Called to room regarding the lipids bag emptied out. Found lipids bag empty. Removed Lipids from TNA line. TNA continues to PICC. No s/s of adverse reaction. Primary RN to inform MD.

## 2015-05-04 NOTE — Consult Note (Addendum)
WOC follow-up: Eakin pouch was applied yesterday and is still intact with suction in place, however it is leaking large amt green drainage over the lower abd scar tissue to the midline area at 6:00 o'clock. 500cc green drainage in the cannister from suction catheter. Wound appearance has improved; beefy red 7X4X.5cm. Penrose drain sutured in place at 1:00 o'clock. Wound area is very painful to touch. Scar tissue below wound remains constantly moist; pt has a significant valley to this location which occurs when she sits forward. Scar tissue is high risk for further breakdown related to constant moisture. Pt could benefit from medium Eakin pouch when discharged to home health so more surface area can be  covered. This size pouch is not available in the [place]. Applied piece of Eakin  over lower abd scar tissue to protect pouch from moisture, then small Eakin pouch with low wall suction attached to bag with a suction catheter to attempt to dry and heal skin and increase pouch wear time. Supplies at bedside and instructions provided for staff nurses if leakage occurs. Julien Girt MSN, RN, CWOCN, CWCN-AP, CNS

## 2015-05-04 NOTE — Progress Notes (Signed)
Nutrition Follow-up  DOCUMENTATION CODES:   Obesity unspecified  INTERVENTION:   -TPN management per pharmacy  NUTRITION DIAGNOSIS:   Inadequate oral intake related to inability to eat as evidenced by NPO status.  Ongoing  GOAL:   Patient will meet greater than or equal to 90% of their needs  Met with TPN  MONITOR:   Diet advancement, PO intake, Labs, Weight trends, Skin, I & O's  REASON FOR ASSESSMENT:   Malnutrition Screening Tool, Consult New TPN/TNA  ASSESSMENT:   The patient is a 74 year old female presenting to discuss diagnostic procedure results. The patient is a 74 year old female who presents with a subcutaneous abscess and a very complex past surgical history.   S/p Procedure 04/29/17:  Abdominal wound exploration/ explantation of infected abdominal mesh  Pt currently NPO. Noted poor tolerance of clear liquids (PO: 0-20%). Pt continued to refused Boost Breeze supplements.   Reviewed COWRN note from 05/04/15. 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 Eakin is connected to suction (400 ml bilious drainage this AM per surgery note). 500 ml output noted from penrose drain this shift per doc flowsheets. COWRN recommending medium sized eakin pouch at home to cover more surface area, however, not available through [facility].    Reviewed pharmacy note- pt receiving Cyclic Clinimix E 8/71: 56m/hr x 1 hour, 160 ml/hr x 10 hours, 50 ml/hr x 1 hour. 20% IVFE at 20 ml/hr x 12 hours via PICC line. This will provides 1672 kcal and 84g protein (which meets 98% of estimated kcal needs and 84% of estimated protein needs).  Labs reviewed: Na: 131, K: 5.6. Mg and Phos WDL.   Diet Order:  TPN (CLINIMIX-E) Adult TPN (CLINIMIX) Adult without lytes Diet NPO time specified Except for: Ice Chips, Sips with Meds  Skin:  Wound (see comment) (abdominal wound with 2 openings, enterocutaneous fistula)  Last  BM:  05/04/15  Height:   Ht Readings from Last 1 Encounters:  05/01/15 _0  (1.626 m)    Weight:   Wt Readings from Last 1 Encounters:  05/01/15 218 lb 14.7 oz (99.3 kg)    Ideal Body Weight:  54.5 kg  BMI:  Body mass index is 37.56 kg/(m^2).  Estimated Nutritional Needs:   Kcal:  1700-1900  Protein:  100-110 grams  Fluid:  1.7-1.9 L  EDUCATION NEEDS:   No education needs identified at this time  Corrie Reder A. WJimmye Norman RD, LDN, CDE Pager: [phone] hours Pager: [phone]

## 2015-05-05 LAB — BASIC METABOLIC PANEL
ANION GAP: 6 (ref 5–15)
BUN: 52 mg/dL — ABNORMAL HIGH (ref 6–20)
CO2: 21 mmol/L — AB (ref 22–32)
Calcium: 9 mg/dL (ref 8.9–10.3)
Chloride: 102 mmol/L (ref 101–111)
Creatinine, Ser: 1.62 mg/dL — ABNORMAL HIGH (ref 0.44–1.00)
GFR calc Af Amer: 35 mL/min — ABNORMAL LOW (ref >60)
GFR calc non Af Amer: 30 mL/min — ABNORMAL LOW (ref >60)
GLUCOSE: 191 mg/dL — AB (ref 65–99)
POTASSIUM: 4.7 mmol/L (ref 3.5–5.1)
Sodium: 129 mmol/L — ABNORMAL LOW (ref 135–145)

## 2015-05-05 LAB — GLUCOSE, CAPILLARY
GLUCOSE-CAPILLARY: 124 mg/dL — AB (ref 65–99)
GLUCOSE-CAPILLARY: 217 mg/dL — AB (ref 65–99)
GLUCOSE-CAPILLARY: 218 mg/dL — AB (ref 65–99)
GLUCOSE-CAPILLARY: 226 mg/dL — AB (ref 65–99)
Glucose-Capillary: 153 mg/dL — ABNORMAL HIGH (ref 65–99)

## 2015-05-05 MED ORDER — INSULIN ASPART 100 UNIT/ML ~~LOC~~ SOLN
0.0000 [IU] | SUBCUTANEOUS | Status: DC
  Administered 2015-05-05 (×2): 5 [IU] via SUBCUTANEOUS
  Administered 2015-05-06 (×2): 3 [IU] via SUBCUTANEOUS
  Administered 2015-05-06: 5 [IU] via SUBCUTANEOUS
  Administered 2015-05-08 (×2): 2 [IU] via SUBCUTANEOUS
  Administered 2015-05-08 – 2015-05-09 (×2): 3 [IU] via SUBCUTANEOUS
  Administered 2015-05-10: 2 [IU] via SUBCUTANEOUS
  Administered 2015-05-10: 3 [IU] via SUBCUTANEOUS
  Administered 2015-05-10 – 2015-05-11 (×2): 2 [IU] via SUBCUTANEOUS
  Administered 2015-05-11 – 2015-05-13 (×5): 3 [IU] via SUBCUTANEOUS
  Administered 2015-05-13 – 2015-05-14 (×2): 2 [IU] via SUBCUTANEOUS
  Administered 2015-05-14: 3 [IU] via SUBCUTANEOUS
  Administered 2015-05-15 (×2): 2 [IU] via SUBCUTANEOUS
  Administered 2015-05-15: 3 [IU] via SUBCUTANEOUS
  Administered 2015-05-15 – 2015-05-16 (×2): 2 [IU] via SUBCUTANEOUS
  Administered 2015-05-16 (×2): 3 [IU] via SUBCUTANEOUS
  Administered 2015-05-17: 2 [IU] via SUBCUTANEOUS
  Administered 2015-05-17 – 2015-05-18 (×2): 3 [IU] via SUBCUTANEOUS
  Administered 2015-05-18: 2 [IU] via SUBCUTANEOUS
  Administered 2015-05-18: 3 [IU] via SUBCUTANEOUS
  Administered 2015-05-18: 2 [IU] via SUBCUTANEOUS
  Administered 2015-05-19 (×2): 3 [IU] via SUBCUTANEOUS
  Administered 2015-05-19 – 2015-05-20 (×2): 2 [IU] via SUBCUTANEOUS
  Administered 2015-05-20: 3 [IU] via SUBCUTANEOUS
  Administered 2015-05-20: 2 [IU] via SUBCUTANEOUS
  Administered 2015-05-20: 3 [IU] via SUBCUTANEOUS
  Administered 2015-05-21: 2 [IU] via SUBCUTANEOUS

## 2015-05-05 MED ORDER — PREDNISONE 20 MG PO TABS
40.0000 mg | ORAL_TABLET | Freq: Once | ORAL | Status: AC
  Administered 2015-05-06: 40 mg via ORAL
  Filled 2015-05-05: qty 2

## 2015-05-05 MED ORDER — FAT EMULSION 20 % IV EMUL
240.0000 mL | INTRAVENOUS | Status: AC
  Administered 2015-05-05: 240 mL via INTRAVENOUS
  Filled 2015-05-05: qty 250

## 2015-05-05 MED ORDER — PREDNISONE 20 MG PO TABS
20.0000 mg | ORAL_TABLET | Freq: Once | ORAL | Status: AC
  Administered 2015-05-07: 20 mg via ORAL
  Filled 2015-05-05 (×3): qty 1

## 2015-05-05 MED ORDER — TRACE MINERALS CR-CU-MN-SE-ZN 10-1000-500-60 MCG/ML IV SOLN
INTRAVENOUS | Status: AC
  Administered 2015-05-05: 18:00:00 via INTRAVENOUS
  Filled 2015-05-05: qty 1800

## 2015-05-05 MED ORDER — PREDNISONE 20 MG PO TABS: 20.0000 mg | ORAL_TABLET | Freq: Every day | ORAL | Status: DC

## 2015-05-05 NOTE — Consult Note (Signed)
WOC follow-up: Eakin pouch was applied yesterday and is still intact with suction in place, however it is leaking large amt green drainage over the lower abd scar tissue to the midline area at 6:00 o'clock. Mod amt green drainage in the cannister from suction catheter. Penrose drain is loose in wound and no longer sutured in place. Wound area is very painful to touch. Scar tissue below wound remains constantly moist; pt has a significant valley to this location which occurs when she sits forward. Scar tissue is high risk for further breakdown related to constant moisture. Pt could benefit from medium Eakin pouch when discharged to home health so more surface area can be covered. This size pouch is not available in the [place] [facility]. Applied piece of Eakin over lower abd scar tissue to protect pouch from moisture with hydrofiber underneath, then small Eakin pouch with low wall suction attached to bag with a suction catheter. Supplies at bedside and instructions provided for staff nurses if leakage occurs. Julien Girt MSN, RN, CWOCN, CWCN-AP, CNS

## 2015-05-05 NOTE — Care Management Important Message (Signed)
Important Message  Patient Details  Name: Marissa [name] MRN: [id number] Date of Birth: [birth year]   Medicare Important Message Given:  Yes-third notification given    Ella Bodo, RN 05/05/2015, 3:44 PM

## 2015-05-05 NOTE — Progress Notes (Signed)
[facility] Progress Note  7 Days Post-Op  Subjective: Pt feels okay, no N/V, has been NPO.  Ambulating well.  No pain.  Unfortunately drainage went from 437mL/24hr to 832mL/24hr.  Still bilious.  No BM.  Objective: Vital signs in last 24 hours: Temp:  [97.7 F (36.5 C)-98.4 F (36.9 C)] 97.9 F (36.6 C) (08/23 0557) Pulse Rate:  [77-85] 82 (08/23 0557) Resp:  [16-17] 16 (08/23 0557) BP: (114-143)/(48-62) 143/60 mmHg (08/23 0557) SpO2:  [97 %-100 %] 100 % (08/23 0557) Last BM Date: 05/04/15  Intake/Output from previous day: 08/22 0701 - 08/23 0700 In: 0  Out: 2800 [Urine:2000; Drains:800] Intake/Output this shift: Total I/O In: 0  Out: 175 [Drains:175]  PE: Gen:  Alert, NAD, pleasant Abd: Soft, ND, mildly tender around suction tubing in wound, open visible wound with bilious drainage (890mL output), +BS, no HSM  Lab Results:   Recent Labs  05/04/15 0554  WBC 8.5  HGB 8.0*  HCT 25.9*  PLT 229   BMET  Recent Labs  05/04/15 0554 05/05/15 0557  NA 131* 129*  K 5.6* 4.7  CL 103 102  CO2 20* 21*  GLUCOSE 149* 191*  BUN 46* 52*  CREATININE 1.40* 1.62*  CALCIUM 9.4 9.0   PT/INR No results for input(s): LABPROT, INR in the last 72 hours. CMP     Component Value Date/Time   NA 129* 05/05/2015 0557   K 4.7 05/05/2015 0557   CL 102 05/05/2015 0557   CO2 21* 05/05/2015 0557   GLUCOSE 191* 05/05/2015 0557   BUN 52* 05/05/2015 0557   CREATININE 1.62* 05/05/2015 0557   CREATININE 1.64* 06/27/2014 1647   CALCIUM 9.0 05/05/2015 0557   PROT 7.5 05/04/2015 0554   ALBUMIN 2.7* 05/04/2015 0554   AST 24 05/04/2015 0554   ALT 35 05/04/2015 0554   ALKPHOS 99 05/04/2015 0554   BILITOT 0.4 05/04/2015 0554   GFRNONAA 30* 05/05/2015 0557   GFRAA 35* 05/05/2015 0557   Lipase     Component Value Date/Time   LIPASE 27 12/26/2014 1815       Studies/Results: Dg Ankle 2 Views Left  05/04/2015   CLINICAL DATA:  Left ankle pain.  EXAM: LEFT ANKLE - 2  VIEW  COMPARISON:  None.  FINDINGS: Degenerative changes in the tibiotalar joint as well as in the visualized intertarsal joints. Plantar and Achilles spurs of the calcaneus. Mild soft tissue swelling. No evidence of acute fracture or dislocation.  IMPRESSION: Degenerative changes in the left ankle and foot.   Electronically Signed   By: Lucienne Capers M.D.   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 -Ambulate and IS -SCD's and lovenox -Appreciate medicines help -May need to re address surgical interventions in 1-2 weeks to take  down fistula if output not improved.  Dr. Marlou Starks and Dr. Georgette Dover discussing. -May need to get UGI with SBFT to check location of fistula  Gout flare (left ankle) - per medicine, prednisone, colchicine ABL anemia - Hgb up to 8.0 yesterday Hyperkalemia - down to 4.7 CKD Stage 3 -  Up to1.62 Disp - Continue inpatient today, await improvement in drain output so we can discontinue suction     LOS: 8 days    [name] 05/05/2015, 10:01 AM Pager: [phone]

## 2015-05-05 NOTE — Progress Notes (Signed)
PROGRESS NOTE  Marissa [name] YBO:[id number] DOB: Nov 08, 1940 DOA: 04/27/2015 PCP: [name], MD Brief History 74 year old female Marissa [name] with extensive past surgical history-left hemicolectomy with left transverse colon to sigmoid colon anastomosis/appendectomy/umbilical hernia repair (2003), repair of incarcerated ventral incisional hernia (2009), TAH with bilateral salpingo-oophorectomy with extensive enterolysis in [id number] complicated by enterocutaneous fistula (2013) which subsequently healed, June 2016 admitted again for large subcutaneous abscess and enterocutaneous fistula-managed with Eaken's pouch, bowel rest, TPN and Invanz, followed up by primary surgeon in office and due to concern for persisting underlying abscess Marissa [name] admitted to hospital on 04/27/15 with plans for I & D. The Marissa [name] ultimately went for I&D and explantation of infected abdominal mesh on 04/28/2015. She also has PMH of CAD, s/p OM BMS in August 2007 with ISR treated with a DES April 2009, Cath Done in August 2009 showed patency of the stent, Myoview October 2015 was low risk and echo at that time showed EF 55-60 percent. Cardiology evaluated her during recent hospitalization, determined that her cardiac status was stable and cleared her for laparotomy on 03/20/15. She also has PMH of HTN, stage III chronic kidney disease, chronic diastolic CHF, gout, OSA, GERD, HLD, diet controlled DM, DVT-not on anticoagulation secondary to recurrent diverticular and gastric bleeding and iron deficiency anemia. Assessment/Plan: enterocutaneous fistula Status post abdominal wound exploration/explantation of infected abdominal mesh per Dr.Tsuei 04/28/2015 -Marissa [name] is afebrile and hemodynamically stable without leukocytosis -Management per surgical team -Continue npo/TPN per surgery -Eakins pouch  Generalized weakness -Check urine and urine culture--not suggestive of UTI -Likely due to deconditioning -PT evaluation-->no  home needs -no focal neuro signs  Left Ankle Pain/Acute Gouty Flare -suspect gouty flare--improving with prednisone -5 days of prednisone--see below -uric acid--7.8 -xray L-ankle-->DJD -due to pt's CKD stage 3, hesitant to use cochicine -prednisone 50 mg daily x 3 day already given -prednisone 40 mg on 8/24  -prednisone 20mg  on 8/25 Hyperkalemia -8/22--Kayexalate 1 -Repeat BMP in the morning -May need to adjust TPN slightly.  hypertension -Blood pressures initially soft and medications were held -Continue metoprolol tartrate -Reintroduce amlodipine low-dose 2.5 mg (does not need 10mg  dose) -BP well controlled on metoprolol and amlodipine 2.5mg  -Do not plan to restart ARB (Benicar) in the setting of acute on chronic renal failure--will not restart after discharge  Acute on chronic kidney disease stage III -Baseline creatinine 1.1-1.4 -may need to start saline 8/24 if serum creatinine continues to rise -due to volume depletion and npo except TPN  chronic diastolic CHF Compensated.  -07/02/2014 echo EF 55-60%, no WMA  obstructive sleep apnea CPAP daily at bedtime. -Not compliant with CPAP and uses it up to 1-2 times per week  gastroesophageal reflux disease PPI.  well-controlled type 2 diabetes mellitus diet-controlled Hemoglobin A1c was 5.7 on 03/14/2015.  -CBGs controlled Continue sliding scale insulin. -Intermitten CBG elevation likely due to TPN, but essentially well controlled  history of DVT Not anticoagulation candidate secondary to recurrent diverticular and gastric bleeding--hemorrhagic gastritis noted on EGD 01/13/2015.  iron deficiency anemia -drop in Hgb may be dilutional but remains stable now -Transfuse to hemoglobin< 7 -Iron saturation 11% -04/30/15--Given dose of IV iron- and continue po niferex after d/c  coronary artery disease Stable. No chest pain -Continue metoprolol tartrate   Family Communication: Pt at beside Disposition Plan:per  primary    Procedures/Studies: Dg Ankle 2 Views Left  05/04/2015   CLINICAL DATA:  Left ankle pain.  EXAM: LEFT ANKLE - 2 VIEW  COMPARISON:  None.  FINDINGS: Degenerative changes in the tibiotalar joint as well as in the visualized intertarsal joints. Plantar and Achilles spurs of the calcaneus. Mild soft tissue swelling. No evidence of acute fracture or dislocation.  IMPRESSION: Degenerative changes in the left ankle and foot.   Electronically Signed   By: Lucienne Capers M.D.   On: 05/04/2015 02:57   Ct Angio Chest Pe W/cm &/or Wo Cm  04/20/2015   CLINICAL DATA:  74 year old female with chest pain and elevated D-dimer.  EXAM: CT ANGIOGRAPHY CHEST WITH CONTRAST  TECHNIQUE: Multidetector CT imaging of the chest was performed using the standard protocol during bolus administration of intravenous contrast. Multiplanar CT image reconstructions and MIPs were obtained to evaluate the vascular anatomy.  CONTRAST:  75 mL of Omnipaque 350.  COMPARISON:  Chest CT 12/14/2010.  FINDINGS: Comment: Study is limited by a combination of respiratory motion and beam hardening artifact from the Marissa [name]'s large body habitus.  Mediastinum/Lymph Nodes: While there is no central, lobar or proximal segmental sized filling defect to suggest pulmonary embolism, smaller distal segmental and subsegmental sized emboli cannot be excluded secondary to respiratory motion and excessive image noise from the Marissa [name]'s large body habitus. Heart size is normal. There is no significant pericardial fluid, thickening or pericardial calcification. There is atherosclerosis of the thoracic aorta, the great vessels of the mediastinum and the coronary arteries, including calcified atherosclerotic plaque in the left anterior descending, left circumflex and right coronary arteries. No pathologically enlarged mediastinal or hilar lymph nodes. Esophagus is unremarkable in appearance. No axillary lymphadenopathy. Right upper extremity PICC with tip  terminating in the mid superior vena cava.  Lungs/Pleura: No acute consolidative airspace disease. No pleural effusions. No suspicious appearing pulmonary nodules or masses.  Upper Abdomen: Status post cholecystectomy. Small calcified granulomas in the liver.  Musculoskeletal/Soft Tissues: There are no aggressive appearing lytic or blastic lesions noted in the visualized portions of the skeleton.  Review of the MIP images confirms the above findings.  IMPRESSION: 1. Although today's study is limited by excessive image noise and Marissa [name] respiratory motion, there is no central, lobar or proximal segmental sized pulmonary embolism. Smaller distal segmental and subsegmental sized emboli cannot be entirely excluded. 2. Atherosclerosis, including three-vessel coronary artery disease. Assessment for potential risk factor modification, dietary therapy or pharmacologic therapy may be warranted, if clinically indicated. 3. Additional incidental findings, as above.   Electronically Signed   By: Vinnie Langton M.D.   On: 04/20/2015 16:40   Ct Abdomen Pelvis W Contrast  04/14/2015   CLINICAL DATA:  Abdominal wall abscess in the umbilical region for 1 month. History of umbilical hernia repair. Partial left colectomy, tubal ligation, hysterectomy.  EXAM: CT ABDOMEN AND PELVIS WITH CONTRAST  TECHNIQUE: Multidetector CT imaging of the abdomen and pelvis was performed using the standard protocol following bolus administration of intravenous contrast.  CONTRAST:  153mL ISOVUE-300 IOPAMIDOL (ISOVUE-300) INJECTION 61%  COMPARISON:  03/20/2015 and 03/13/2015 similar exam, fistula injection 03/23/2015  FINDINGS: Lower chest:  Lung bases are clear.  Hepatobiliary: Cholecystectomy clips are noted. Liver is unremarkable.  Pancreas: Normal  Spleen: Normal  Adrenals/Urinary Tract: Adrenal glands are normal. 1.3 cm right upper renal pole cyst. No radiopaque renal, ureteral, or bladder calculus.  Stomach/Bowel: Small hiatal hernia  reidentified. Evidence of periumbilical enterocutaneous fistula a reidentified involving adjacent loops of small bowel and containing contrast/ gas level. Overall, this collection measures slightly smaller when the internal cavity component is measured using similar technique as on the  prior exam, currently 3.9 x 2.0 cm image 49, previously 5.6 x 2.3 cm at a similar anatomic level.  Colonic diverticuli noted without evidence for diverticulitis.  Vascular/Lymphatic: A small amount of stranding/haziness within the root of small bowel mesentery is stable. Moderate atheromatous aortic calcification without aneurysm. No lymphadenopathy.  Other: No free air  Musculoskeletal: No acute osseous abnormality. Lumbar spine disc degenerative change.  IMPRESSION: Slight decrease in size of cavitary component of periumbilical enterocutaneous fistula.   Electronically Signed   By: Conchita Paris M.D.   On: 04/14/2015 10:47   Dg Chest Port 1 View  04/27/2015   CLINICAL DATA:  Marissa [name] for revision of PICC line.  EXAM: PORTABLE CHEST - 1 VIEW  COMPARISON:  Single view of the chest 04/20/2015.  FINDINGS: Right PICC remains in place with the tip projecting at the confluence of the brachiocephalic veins. The lungs are clear. Heart size is normal. No pneumothorax or pleural effusion. Mild asymmetric elevation of the right hemidiaphragm relative to the left is noted.  IMPRESSION: Tip of right PICC projects at the confluence of the brachiocephalic veins. No acute disease.   Electronically Signed   By: Inge Rise M.D.   On: 04/27/2015 11:29   Dg Chest Port 1 View  04/20/2015   CLINICAL DATA:  Acute onset of shortness of breath and centralized chest pain. Initial encounter.  EXAM: PORTABLE CHEST - 1 VIEW  COMPARISON:  Chest radiograph performed 03/15/2015, and CTA of the chest performed earlier today at 2:38 p.m.  FINDINGS: The lungs are hypoexpanded. Mild left basilar atelectasis is noted. No definite pleural effusion or  pneumothorax is seen. There is elevation of the right hemidiaphragm.  The cardiomediastinal silhouette is borderline normal in size. No acute osseous abnormalities are identified.  IMPRESSION: Lungs hypoexpanded. Mild left basilar atelectasis noted. Elevation of the right hemidiaphragm.   Electronically Signed   By: Garald Balding M.D.   On: 04/20/2015 22:23         Subjective: Left ankle is feeling better. Denies any fevers, chills, chest pain, shortness breath, nausea, vomiting, diarrhea. She is passing flatus. No headaches. No dysuria.  Objective: Filed Vitals:   05/04/15 1343 05/04/15 2125 05/05/15 0557 05/05/15 1415  BP: 116/62 114/48 143/60 131/64  Pulse: 77 85 82 79  Temp: 97.7 F (36.5 C) 98.4 F (36.9 C) 97.9 F (36.6 C) 97.7 F (36.5 C)  TempSrc: Oral Oral Oral Oral  Resp: 16 17 16 16   Height:      Weight:      SpO2: 100% 97% 100% 100%    Intake/Output Summary (Last 24 hours) at 05/05/15 1922 Last data filed at 05/05/15 1500  Gross per 24 hour  Intake      0 ml  Output   2475 ml  Net  -2475 ml   Weight change:  Exam:   General:  Pt is alert, follows commands appropriately, not in acute distress  HEENT: No icterus, No thrush, No neck mass, [place]/AT  Cardiovascular: RRR, S1/S2, no rubs, no gallops  Respiratory: CTA bilaterally, no wheezing, no crackles, no rhonchi  Abdomen: Soft/+BS, non tender, non distended, no guarding  Extremities: No edema, No lymphangitis, No petechiae, No rashes, no synovitis  Data Reviewed: Basic Metabolic Panel:  Recent Labs Lab 04/29/15 0500 04/30/15 0501 05/01/15 0503 05/03/15 0630 05/04/15 0554 05/05/15 0557  NA 132* 139 133* 133* 131* 129*  K 4.5 4.0 4.6 4.6 5.6* 4.7  CL 103 109 103 103 103 102  CO2 23  25 23 24  20* 21*  GLUCOSE 147* 128* 128* 114* 149* 191*  BUN 27* 28* 34* 41* 46* 52*  CREATININE 1.41* 1.33* 1.43* 1.34* 1.40* 1.62*  CALCIUM 8.7* 9.0 8.6* 8.8* 9.4 9.0  MG 2.0 1.8  --  2.0 2.0  --   PHOS 3.2 3.7   --   --  3.5  --    Liver Function Tests:  Recent Labs Lab 04/29/15 0500 04/30/15 0501 05/04/15 0554  AST 37 19 24  ALT 40 26 35  ALKPHOS 94 79 99  BILITOT 0.5 0.3 0.4  PROT 7.1 6.6 7.5  ALBUMIN 2.5* 2.6* 2.7*   No results for input(s): LIPASE, AMYLASE in the last 168 hours. No results for input(s): AMMONIA in the last 168 hours. CBC:  Recent Labs Lab 04/29/15 0500 04/30/15 0501 05/01/15 0503 05/04/15 0554  WBC 7.2 5.5 6.2 8.5  NEUTROABS 6.1  --   --  7.3  HGB 7.3* 7.4* 7.4* 8.0*  HCT 24.5* 24.6* 24.3* 25.9*  MCV 73.6* 73.2* 73.2* 72.8*  PLT 190 184 179 229   Cardiac Enzymes: No results for input(s): CKTOTAL, CKMB, CKMBINDEX, TROPONINI in the last 168 hours. BNP: Invalid input(s): POCBNP CBG:  Recent Labs Lab 05/04/15 1802 05/04/15 2013 05/05/15 0013 05/05/15 0611 05/05/15 1153  GLUCAP 127* 122* 218* 153* 124*    Recent Results (from the past 240 hour(s))  Surgical pcr screen     Status: None   Collection Time: 04/27/15  7:41 PM  Result Value Ref Range Status   MRSA, PCR NEGATIVE NEGATIVE Final   Staphylococcus aureus NEGATIVE NEGATIVE Final    Comment:        The Xpert SA Assay (FDA approved for NASAL specimens in patients over 17 years of age), is one component of a comprehensive surveillance program.  Test performance has been validated by [facility] for patients greater than or equal to 28 year old. It is not intended to diagnose infection nor to guide or monitor treatment.   Urine culture     Status: None   Collection Time: 04/30/15  9:20 PM  Result Value Ref Range Status   Specimen Description URINE, RANDOM  Final   Special Requests NONE  Final   Culture MULTIPLE SPECIES PRESENT, SUGGEST RECOLLECTION  Final   Report Status 05/02/2015 FINAL  Final     Scheduled Meds: . amLODipine  2.5 mg Oral Daily  . Chlorhexidine Gluconate Cloth  6 each Topical Q0600  . enoxaparin (LOVENOX) injection  40 mg Subcutaneous Q24H  . feeding  supplement  1 Container Oral TID BM  . insulin aspart  0-15 Units Subcutaneous 4 times per day  . iron polysaccharides  150 mg Oral Daily  . metoprolol tartrate  25 mg Oral q morning - 10a  . mupirocin ointment   Nasal BID  . octreotide  50 mcg Subcutaneous 3 times per day  . pantoprazole (PROTONIX) IV  40 mg Intravenous QHS  . cyanocobalamin  2,000 mcg Oral Daily   Continuous Infusions: . sodium chloride 10 mL/hr at 04/30/15 1115  . Marland KitchenTPN (CLINIMIX-E) Adult     And  . fat emulsion 240 mL (05/05/15 1742)     Yuki Purves, DO  [facility] Pager [phone]  If 7PM-7AM, please contact night-coverage [email or web address] Password [email or web address] 05/05/2015, 7:22 PM   LOS: 8 days

## 2015-05-05 NOTE — Clinical Documentation Improvement (Signed)
Trauma  Abnormal Lab/Test Results: Sodium level * per 05/05/15 MD progress note.   Possible Clinical Conditions associated with below indicators  Hyponatremia  Other Condition  Cannot Clinically Determine   Supporting Information: Patient's labs during this admission  Component     Latest Ref Rng 04/27/2015         4:08 PM  Sodium     135 - 145 mmol/L 137   Component     Latest Ref Rng 04/28/2015         7:15 PM  Sodium     135 - 145 mmol/L 135   Component     Latest Ref Rng 04/29/2015         5:00 AM  Sodium     135 - 145 mmol/L 132 (L)   Component     Latest Ref Rng 04/30/2015         5:01 AM  Sodium     135 - 145 mmol/L 139    Component     Latest Ref Rng 05/01/2015         5:03 AM  Sodium     135 - 145 mmol/L 133 (L)    Component     Latest Ref Rng 05/03/2015         6:30 AM  Sodium     135 - 145 mmol/L 133 (L)   Component     Latest Ref Rng 05/04/2015         5:54 AM  Sodium     135 - 145 mmol/L 131 (L)   Component     Latest Ref Rng 05/05/2015         5:57 AM  Sodium     135 - 145 mmol/L 129 (L)    Treatment Provided: IVF  Please exercise your independent, professional judgment when responding. A specific answer is not anticipated or expected.   Thank You,  Sonda Rumble RN, BSN, CCM Clinical Documentation Specialist Phone: [phone]

## 2015-05-05 NOTE — Progress Notes (Signed)
PARENTERAL NUTRITION CONSULT NOTE - FOLLOW UP  Pharmacy Consult for TPN Indication: EC fistula  No Known Allergies  Patient Measurements: Height: 5\' 4"  (162.6 cm) Weight: 218 lb 14.7 oz (99.3 kg) IBW/kg (Calculated) : 54.7  Weight 99.3 kg  Vital Signs: Temp: 97.9 F (36.6 C) (08/23 0557) Temp Source: Oral (08/23 0557) BP: 143/60 mmHg (08/23 0557) Pulse Rate: 82 (08/23 0557) Intake/Output from previous day: 08/22 0701 - 08/23 0700 In: 0  Out: 2800 [Urine:2000; Drains:800] Intake/Output from this shift: Total I/O In: -  Out: 175 [Drains:175] Labs:  Recent Labs  05/04/15 0554  WBC 8.5  HGB 8.0*  HCT 25.9*  PLT 229    Recent Labs  05/03/15 0630 05/04/15 0554 05/05/15 0557  NA 133* 131* 129*  K 4.6 5.6* 4.7  CL 103 103 102  CO2 24 20* 21*  GLUCOSE 114* 149* 191*  BUN 41* 46* 52*  CREATININE 1.34* 1.40* 1.62*  CALCIUM 8.8* 9.4 9.0  MG 2.0 2.0  --   PHOS  --  3.5  --   PROT  --  7.5  --   ALBUMIN  --  2.7*  --   AST  --  24  --   ALT  --  35  --   ALKPHOS  --  99  --   BILITOT  --  0.4  --   PREALBUMIN  --  21.0  --   TRIG  --  45  --    Estimated Creatinine Clearance: 34.9 mL/min (by C-G formula based on Cr of 1.62).   Recent Labs  05/04/15 2013 05/05/15 0013 05/05/15 0611  GLUCAP 122* 218* 153*   Medications:  Infusions:  . sodium chloride 10 mL/hr at 04/30/15 1115   Insulin Requirements in the past 24 hours:  6 units of SSI  Nutritional Goals: per RD on 8/22 Protein: 100-110 g  Kcal: 1700-1900  Fluid: 1.7-1.9 L  Current Nutrition:  Chronic TPN per [place] kcal, 85g protein - my appreciation to Carolynn Sayers for her documentation of home TPN.  Cyclic Clinimix E 5/57: 46ml/hr x 1 hour, 160 ml/hr x 10 hours, 50 ml/hr x 1 hour. 20% IVFE at 20 ml/hr x 12 hours. This will provide 1672 kcal and 84g protein. Boost TID ordered (patient refusing and now NPO)  NPO  Assessment:  Surgeries/Procedures: s/p abdominal wound  exploration with removal of infected abdominal mesh 8/16  GI: EC fistula, abdominal wound. Drain in place with 942ml output 8/22. Sandostatin added on 8/22 to help with closure.  Endo: CBGs controlled off TPN but spiked to 218 on TPN. SSI   Lytes:Lytes wnl exc Na 129, CoCa 9.8. Phos and Mg ok on 8/22.   Renal: SCr trending up to 1.62, Normalized crcl ~70ml/min.  UOP 0.8 ml/kg/hr.  No supplemental IV fluids in addition to her TPN at home. NS @ KVO. I/O about equal as no ins charted yesterday.  Pulm: OSA  Cards: VSS, metoprolol  Hepatobil: LFTs ok. TG 45, Tbili 0.4. Prealbumin 21. Albumin low at 2.7  Neuro: ok  ID: no antibiotics. Afebrile and WBC wnl.  Best Practices: lovenox, PPI IV  TPN Access: PICC TPN start date: chronic  Plan:  Cycle Clinimix E 5/15: Total volume of 1800 mL over 12 hours. 78ml/hr x 1 hour, 170 ml/hr x 10 hours, 50 ml/hr x 1 hour.  Cycle 20% IVFE at 20 ml/hr x 12 hours. TPN + IVFE will provide 1758 kcal (100% of needs)  and 90 g of protein (90% of needs)  Continue MVI and trace elements in TPN Change SSI to moderate Continue IV fluids at [facility] Monitor volume status, cyclic TPN tolerance F/U TPN labs, Bmet to check K  Elenor Quinones, PharmD Clinical Pharmacist Pager [phone] 05/05/2015 8:28 AM

## 2015-05-05 NOTE — Progress Notes (Signed)
@   2040 Heard IV alarm going off, noted lipids bag emptied out. IV team notified. No s/s of adverse reactions noted. On call Doctor Rolm Bookbinder made aware with no new order.

## 2015-05-06 ENCOUNTER — Inpatient Hospital Stay (HOSPITAL_COMMUNITY): Payer: Medicare Other

## 2015-05-06 DIAGNOSIS — M10072 Idiopathic gout, left ankle and foot: Secondary | ICD-10-CM | POA: Insufficient documentation

## 2015-05-06 LAB — BASIC METABOLIC PANEL
Anion gap: 8 (ref 5–15)
BUN: 70 mg/dL — AB (ref 6–20)
CHLORIDE: 102 mmol/L (ref 101–111)
CO2: 21 mmol/L — ABNORMAL LOW (ref 22–32)
CREATININE: 1.59 mg/dL — AB (ref 0.44–1.00)
Calcium: 9.1 mg/dL (ref 8.9–10.3)
GFR calc Af Amer: 36 mL/min — ABNORMAL LOW (ref >60)
GFR, EST NON AFRICAN AMERICAN: 31 mL/min — AB (ref >60)
GLUCOSE: 119 mg/dL — AB (ref 65–99)
Potassium: 4.9 mmol/L (ref 3.5–5.1)
SODIUM: 131 mmol/L — AB (ref 135–145)

## 2015-05-06 LAB — GLUCOSE, CAPILLARY
GLUCOSE-CAPILLARY: 109 mg/dL — AB (ref 65–99)
GLUCOSE-CAPILLARY: 152 mg/dL — AB (ref 65–99)
GLUCOSE-CAPILLARY: 213 mg/dL — AB (ref 65–99)
GLUCOSE-CAPILLARY: 196 mg/dL — AB (ref 65–99)
Glucose-Capillary: 101 mg/dL — ABNORMAL HIGH (ref 65–99)
Glucose-Capillary: 107 mg/dL — ABNORMAL HIGH (ref 65–99)

## 2015-05-06 MED ORDER — IOHEXOL 300 MG/ML  SOLN: 25.0000 mL | INTRAMUSCULAR | Status: DC

## 2015-05-06 MED ORDER — TRACE MINERALS CR-CU-MN-SE-ZN 10-1000-500-60 MCG/ML IV SOLN
INTRAVENOUS | Status: AC
  Administered 2015-05-06: 18:00:00 via INTRAVENOUS
  Filled 2015-05-06: qty 1800

## 2015-05-06 MED ORDER — OCTREOTIDE ACETATE 50 MCG/ML IJ SOLN
100.0000 ug | Freq: Three times a day (TID) | INTRAMUSCULAR | Status: DC
  Administered 2015-05-06: 100 ug via SUBCUTANEOUS
  Filled 2015-05-06 (×3): qty 2

## 2015-05-06 MED ORDER — IOHEXOL 300 MG/ML  SOLN
25.0000 mL | INTRAMUSCULAR | Status: AC
  Administered 2015-05-06 (×2): 25 mL via ORAL

## 2015-05-06 MED ORDER — OCTREOTIDE ACETATE 100 MCG/ML IJ SOLN
100.0000 ug | Freq: Three times a day (TID) | INTRAMUSCULAR | Status: DC
  Administered 2015-05-06 – 2015-05-12 (×16): 100 ug via SUBCUTANEOUS
  Filled 2015-05-06 (×22): qty 1

## 2015-05-06 MED ORDER — IOHEXOL 300 MG/ML  SOLN: 25.0000 mL | INTRAMUSCULAR | Status: AC

## 2015-05-06 MED ORDER — FAT EMULSION 20 % IV EMUL
240.0000 mL | INTRAVENOUS | Status: AC
  Administered 2015-05-06: 240 mL via INTRAVENOUS
  Filled 2015-05-06: qty 250

## 2015-05-06 NOTE — Consult Note (Addendum)
WOC follow-up: Eakin pouch intact with good seal since yesterday to abd fistula wound.  Large amt green drainage in suction cannister, suction catheter remains on to low wall suction.  Supplies in room and instructions are available for bedside nurses if leakage occurs. Julien Girt MSN, RN, [place], [place], [place]

## 2015-05-06 NOTE — Progress Notes (Signed)
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 The patient ultimately went for I&D and explantation of infected abdominal mesh on 04/28/2015. She also has PMH of CAD, s/p OM BMS in August 2007 with ISR treated with a DES April 2009, Cath Done in August 2009 showed patency of the stent, Myoview October 2015 was low risk and echo at that time showed EF 55-60 percent. Cardiology evaluated her during recent hospitalization, determined that her cardiac status was stable and cleared her for laparotomy on 03/20/15. She also has PMH of HTN, stage III chronic kidney disease, chronic diastolic CHF, gout, OSA, GERD, HLD, diet controlled DM, DVT-not on anticoagulation secondary to recurrent diverticular and gastric bleeding and iron deficiency anemia.  Assessment/Plan: #1 enterocutaneous fistula Status post abdominal wound exploration/explantation of infected abdominal mesh per Dr.Tsuei 04/28/2015. Patient is currently afebrile. Patient on TPN. Eakins pouch. Per primary team.  #2 hypertension Blood pressure was initially borderline and a such BP meds were held. Continue current regimen of low-dose Norvasc and metoprolol. We'll hold off on  resuming ARB in the setting of acute on chronic renal failure.  #3 acute on chronic kidney disease stage III Patient's baseline creatinine was 1.1-1.4. Renal function had increased but trending back down. Continue to hold ARB. Follow.    #4 acute on chronic diastolic CHF Stable. Compensated.   #5 obstructive sleep apnea C Pap daily at bedtime.  #6 gastroesophageal reflux disease PPI.  #7 well-controlled type 2 diabetes mellitus diet-controlled Hemoglobin A1c was 5.7 on 03/14/2015. CBGs have ranged from 107- 152. Continue sliding scale insulin.  #8 history of DVT Not anticoagulation candidate secondary to recurrent diverticular gastric bleeding.  #9 iron deficiency anemia H&H stable. Continue iron supplementation.  #10 coronary artery disease Stable. Continue beta blocker.  #11 generalized weakness Likely secondary to deconditioning. UA negative for UTI. PT.  #12 left ankle pain/acute gouty flare Clinical improvement on prednisone. Continue prednisone taper.  #13 prophylaxis PPI for GI prophylaxis. Lovenox for DVT prophylaxis.  Code Status: Full Family Communication: Updated patient. No family at bedside. Disposition Plan: Per primary team.   Consultants:  [facility]:Dr Hongalgi 04/27/2015  Procedures:  Chest x-ray 04/27/2015  Abdominal wound exploration/explantation of infected abdominal mesh per Dr. Georgette Dover 04/28/2015  CT abdomen and pelvis 05/06/2015  X-ray left ankle 05/04/2015  Antibiotics:  None  HPI/Subjective: Patient denies any nausea or vomiting. Patient passing flatus. Patient states she feels full when she tries to drink anything and she was drinking the oral contrast.  Objective: Filed Vitals:   05/06/15 1710  BP: 146/74  Pulse: 63  Temp: 97.3 F (36.3 C)  Resp: 18    Intake/Output Summary (Last 24 hours) at 05/06/15 1927 Last data filed at 05/06/15 1847  Gross per 24 hour  Intake      0 ml  Output   2050 ml  Net  -2050 ml    [employer]  05/01/15 0805  Weight: 99.3 kg (218 lb 14.7 oz)    Exam:   General:  NAD  Cardiovascular: RRR  Respiratory: CTAB anterior lung fields  Abdomen: Soft, + bowel sounds, some tenderness to palpation.  Musculoskeletal: No clubbing cyanosis or edema.  Data Reviewed: Basic Metabolic Panel:  Recent Labs Lab 04/30/15 0501 05/01/15 0503 05/03/15 0630 05/04/15 0554 05/05/15 0557 05/06/15 0612  NA 139 133* 133* 131* 129* 131*  K 4.0 4.6 4.6 5.6* 4.7 4.9  CL 109 103 103 103 102 102  CO2 25 23 24  20* 21* 21*  GLUCOSE 128* 128* 114* 149* 191* 119*  BUN 28* 34* 41* 46* 52* 70*  CREATININE 1.33* 1.43* 1.34* 1.40* 1.62* 1.59*  CALCIUM 9.0 8.6* 8.8* 9.4 9.0 9.1  MG 1.8  --  2.0 2.0  --   --   PHOS 3.7  --   --  3.5  --   --    Liver Function Tests:  Recent Labs Lab 04/30/15 0501 05/04/15 0554  AST 19 24  ALT 26 35  ALKPHOS 79 99  BILITOT 0.3 0.4  PROT 6.6 7.5  ALBUMIN 2.6* 2.7*   No results for input(s): LIPASE, AMYLASE in the last 168 hours. No results for input(s): AMMONIA in the last 168 hours. CBC:  Recent Labs Lab 04/30/15 0501 05/01/15 0503 05/04/15 0554  WBC 5.5 6.2 8.5  NEUTROABS  --   --  7.3  HGB 7.4* 7.4* 8.0*  HCT 24.6* 24.3* 25.9*  MCV 73.2* 73.2* 72.8*  PLT 184 179 229   Cardiac Enzymes: No results for input(s): CKTOTAL, CKMB, CKMBINDEX, TROPONINI in the last 168 hours. BNP (last 3 results)  Recent Labs  11/02/14 2340 03/19/15 1325  BNP 109.2* 141.6*    ProBNP (last 3 results) No results for input(s): PROBNP in the last 8760 hours.  CBG:  Recent Labs Lab 05/05/15 2304 05/06/15 0632 05/06/15 0813 05/06/15 1241 05/06/15 1628  GLUCAP 217* 101* 109* 152* 107*    Recent Results (from the past 240 hour(s))  Surgical pcr screen     Status: None   Collection Time: 04/27/15  7:41 PM  Result Value Ref Range Status   MRSA, PCR NEGATIVE NEGATIVE Final   Staphylococcus aureus NEGATIVE NEGATIVE Final    Comment:         The Xpert SA Assay (FDA approved for NASAL specimens in patients over 5 years of age), is one component of a comprehensive surveillance program.  Test performance has been validated by [facility] for patients greater than or equal to 45 year old. It is not intended to diagnose infection nor to guide or monitor treatment.   Urine culture     Status: None   Collection Time: 04/30/15  9:20 PM  Result Value Ref Range Status   Specimen Description URINE, RANDOM  Final   Special Requests NONE  Final   Culture MULTIPLE SPECIES PRESENT, SUGGEST RECOLLECTION  Final   Report Status 05/02/2015 FINAL  Final     Studies: Ct Abdomen Pelvis Wo Contrast  05/06/2015   CLINICAL DATA:  Enterocutaneous fistula  EXAM: CT ABDOMEN AND PELVIS WITHOUT CONTRAST  TECHNIQUE: Multidetector CT imaging of the abdomen and pelvis was performed following the standard protocol without IV contrast.  COMPARISON:  04/14/2015  FINDINGS: LOWER CHEST: Extensive coronary atherosclerosis.  ABDOMEN/PELVIS:  Liver: No focal abnormality.  Biliary: Cholecystectomy.  Pancreas: Unremarkable.  Spleen: Unremarkable.  Adrenals: Unremarkable.  Kidneys and ureters: No hydronephrosis or stone. Stable lobulation of the  bilateral kidneys.  Bladder: Unremarkable.  Reproductive: Hysterectomy probable oophorectomies.  Bowel: Defect in the supraumbilical midline abdominal wall with internal folded drain is surrounded by enteric contrast from enterocutaneous fistula. On sagittal imaging, a small bowel fistulous tract is visible along the lower margin of the abdominal wall defect. The defect in the bowel wall is channel like. Multiple small bowel loops are matted underneath the diastatic midline abdominal wall. No evidence of bowel obstruction. There is colonic and proximal small bowel diverticulosis.  Retroperitoneum: No mass or adenopathy.  Peritoneum: No ascites or pneumoperitoneum.  Vascular: No acute abnormality.  OSSEOUS: Extensive spondylosis  and facet arthropathy.  IMPRESSION: 1. Mid small bowel enterocutaneous fistula draining into the lower margin of the abdominal wall defect. 2. Extensive small bowel adhesions to the diastatic abdominal wall. 3. Colonic and small bowel diverticulosis.   Electronically Signed   By: Monte Fantasia M.D.   On: 05/06/2015 15:18    Scheduled Meds: . amLODipine  2.5 mg Oral Daily  . Chlorhexidine Gluconate Cloth  6 each Topical Q0600  . enoxaparin (LOVENOX) injection  40 mg Subcutaneous Q24H  . feeding supplement  1 Container Oral TID BM  . insulin aspart  0-15 Units Subcutaneous 4 times per day  . iron polysaccharides  150 mg Oral Daily  . metoprolol tartrate  25 mg Oral q morning - 10a  . mupirocin ointment   Nasal BID  . octreotide  100 mcg Subcutaneous 3 times per day  . pantoprazole (PROTONIX) IV  40 mg Intravenous QHS  . [START ON 05/07/2015] predniSONE  20 mg Oral Once  . cyanocobalamin  2,000 mcg Oral Daily   Continuous Infusions: . sodium chloride 10 mL/hr at 04/30/15 1115  . Marland KitchenTPN (CLINIMIX-E) Adult     And  . fat emulsion 240 mL (05/06/15 1746)    Principal Problem:   Enterocutaneous fistula Active Problems:   OSA (obstructive sleep apnea)   Essential hypertension   CAD S/P OM BMS 8/07, ISR-OM DES 8/09   GERD   Chronic kidney disease, stage 3   DVT, HX OF   Hyperlipidemia with target LDL less than 100   Anemia, iron deficiency   Type 2 diabetes mellitus without complication   Hyperkalemia    Time spent: 42 minutes    Jannae Fagerstrom M.D. [facility] Pager [phone]. If 7PM-7AM, please contact night-coverage at [email or web address], password [facility] 05/06/2015, 7:27 PM  LOS: 9 days

## 2015-05-06 NOTE — Progress Notes (Signed)
Physical Therapy Treatment Patient Details Name: Marissa [name] MRN: [id number] DOB: 20-May-1941 Today's Date: 2015/05/13    History of Present Illness 74 y.o. female admitted with enterocutaneous fistual. s/p s/p Abdominal wound exploration.    PT Comments    Patient mobilizing well, ambulated in hall without device or physical assist. Tolerated increased distance despite fatigue. Will continue to see and progress as tolerated.  Follow Up Recommendations  No PT follow up;Supervision - Intermittent     Equipment Recommendations  None recommended by PT    Recommendations for Other Services       Precautions / Restrictions Precautions Precautions: None Restrictions Weight Bearing Restrictions: No    Mobility  Bed Mobility               General bed mobility comments: received in chair  Transfers Overall transfer level: Needs assistance Equipment used: None Transfers: Sit to/from Stand Sit to Stand: Supervision         General transfer comment: supervision for safety. Minimal sway noted. Does not reach for furniture. Declines to use device.  Ambulation/Gait Ambulation/Gait assistance: Supervision Ambulation Distance (Feet): 525 Feet Assistive device: None Gait Pattern/deviations: Step-through pattern;Trunk flexed;Wide base of support Gait velocity: decreased   General Gait Details: increased LE fatigue with longer distance, no physical assist required, felxed posture during ambulation. Ambulated without device.    Stairs            Wheelchair Mobility    Modified Rankin (Stroke Patients Only)       Balance   Sitting-balance support: No upper extremity supported Sitting balance-Leahy Scale: Normal     Standing balance support: No upper extremity supported Standing balance-Leahy Scale: Good Standing balance comment: still with flexed posture but no physical assist requried                    Cognition Arousal/Alertness:  Awake/alert Behavior During Therapy: WFL for tasks assessed/performed Overall Cognitive Status: Within Functional Limits for tasks assessed                      Exercises      General Comments        Pertinent Vitals/Pain Pain Assessment: No/denies pain    Home Living                      Prior Function            PT Goals (current goals can now be found in the care plan section) Acute Rehab PT Goals Patient Stated Goal: Get better PT Goal Formulation: With patient Time For Goal Achievement: 05/15/15 Potential to Achieve Goals: Good Progress towards PT goals: Progressing toward goals    Frequency  Min 3X/week    PT Plan Current plan remains appropriate    Co-evaluation             End of Session   Activity Tolerance: Patient tolerated treatment well;No increased pain Patient left: in chair;with call bell/phone within reach     Time: 1205-1226 PT Time Calculation (min) (ACUTE ONLY): 21 min  Charges:  $Gait Training: 8-22 mins                    G CodesDuncan Dull 2015-05-13, 2:04 PM Alben Deeds, [place] DPT  [phone]

## 2015-05-06 NOTE — Progress Notes (Signed)
PARENTERAL NUTRITION CONSULT NOTE - FOLLOW UP  Pharmacy Consult for TPN Indication: EC fistula  No Known Allergies  Patient Measurements: Height: 5\' 4"  (162.6 cm) Weight: 218 lb 14.7 oz (99.3 kg) IBW/kg (Calculated) : 54.7  Weight 99.3 kg  Vital Signs: Temp: 98.1 F (36.7 C) (08/24 0556) Temp Source: Oral (08/24 0556) BP: 128/67 mmHg (08/24 0556) Pulse Rate: 77 (08/24 0556) Intake/Output from previous day: 08/23 0701 - 08/24 0700 In: 0  Out: 2175 [Urine:1600; Drains:575] Intake/Output from this shift:   Labs:  Recent Labs  05/04/15 0554  WBC 8.5  HGB 8.0*  HCT 25.9*  PLT 229    Recent Labs  05/04/15 0554 05/05/15 0557 05/06/15 0612  NA 131* 129* 131*  K 5.6* 4.7 4.9  CL 103 102 102  CO2 20* 21* 21*  GLUCOSE 149* 191* 119*  BUN 46* 52* 70*  CREATININE 1.40* 1.62* 1.59*  CALCIUM 9.4 9.0 9.1  MG 2.0  --   --   PHOS 3.5  --   --   PROT 7.5  --   --   ALBUMIN 2.7*  --   --   AST 24  --   --   ALT 35  --   --   ALKPHOS 99  --   --   BILITOT 0.4  --   --   PREALBUMIN 21.0  --   --   TRIG 45  --   --    Estimated Creatinine Clearance: 35.5 mL/min (by C-G formula based on Cr of 1.59).   Recent Labs  05/05/15 2304 05/06/15 0632 05/06/15 0813  GLUCAP 217* 101* 109*   Medications:  Infusions:  . sodium chloride 10 mL/hr at 04/30/15 1115   Insulin Requirements in the past 24 hours:  10 units of SSI  Nutritional Goals: per RD on 8/22 Protein: 100-110 g  Kcal: 1700-1900  Fluid: 1.7-1.9 L  Current Nutrition:  Chronic TPN per [place] kcal, 85g protein - my appreciation to Carolynn Sayers for her documentation of home TPN.  Cyclic Clinimix E 8/67: 73ml/hr x 1 hour, 160 ml/hr x 10 hours, 50 ml/hr x 1 hour. 20% IVFE at 20 ml/hr x 12 hours. This will provide 1672 kcal and 84g protein. Boost TID ordered (patient refusing and now NPO)  NPO  Assessment:  Surgeries/Procedures: s/p abdominal wound exploration with removal of infected  abdominal mesh 8/16  GI: EC fistula, abdominal wound. Drain in place with 592ml output yesterday which is decreased from before. Sandostatin added on 8/22 to help with closure. If unable to help close, may need to address surgical interventions to close fistula. No N/V or abdominal pain. Does have some flatus and last BM was 8/22.   Endo: CBGs controlled off TPN but spiked to 220 on TPN. SSI   Lytes: Lytes wnl exc Na 131, CoCa 9.8. Phos and Mg ok on 8/22.   Renal: SCr elevated at 1.59, Normalized crcl ~23ml/min.  UOP 0.8 ml/kg/hr.  No supplemental IV fluids in addition to her TPN at home. NS @ KVO. I/O about equal as no ins charted yesterday.  Pulm: OSA  Cards: VSS, metoprolol  Hepatobil: LFTs ok. TG 45, Tbili 0.4. Prealbumin 21. Albumin low at 2.7  Neuro: ok  ID: no antibiotics. Afebrile and WBC wnl.  Best Practices: lovenox, PPI IV  TPN Access: PICC TPN start date: chronic  Plan:  Cycle Clinimix E 5/15: Total volume of 1800 mL over 12 hours. 76ml/hr x 1 hour,  170 ml/hr x 10 hours, 50 ml/hr x 1 hour.  Cycle 20% IVFE at 20 ml/hr x 12 hours. TPN + IVFE will provide 1758 kcal (100% of needs) and 90 g of protein (90% of needs)  Continue MVI and trace elements in TPN Continue moderate SSI Continue IV fluids at [facility] Monitor volume status, cyclic TPN tolerance F/U TPN labs  Elenor Quinones, PharmD Clinical Pharmacist Pager [phone] 05/06/2015 9:03 AM

## 2015-05-06 NOTE — Progress Notes (Signed)
[facility] Progress Note  8 Days Post-Op  Subjective: Pt doing okay.  No N/V or abdominal pain.  Output has gone down some.  Eakins pouch sticking well.  Mobilizing OOB.  Some flatus, last BM was 05/04/15.  Objective: Vital signs in last 24 hours: Temp:  [97.7 F (36.5 C)-98.2 F (36.8 C)] 98.1 F (36.7 C) (08/24 0556) Pulse Rate:  [77-79] 77 (08/24 0556) Resp:  [16] 16 (08/24 0556) BP: (128-132)/(62-67) 128/67 mmHg (08/24 0556) SpO2:  [100 %] 100 % (08/24 0556) Last BM Date: 05/04/15  Intake/Output from previous day: 08/23 0701 - 08/24 0700 In: 0  Out: 2175 [Urine:1600; Drains:575] Intake/Output this shift:    PE: Gen:  Alert, NAD, pleasant Abd: Soft, ND, mildly tender around suction tubing in wound, open visible wound with bilious drainage (548mL output), +BS, no HSM  Lab Results:   Recent Labs  05/04/15 0554  WBC 8.5  HGB 8.0*  HCT 25.9*  PLT 229   BMET  Recent Labs  05/05/15 0557 05/06/15 0612  NA 129* 131*  K 4.7 4.9  CL 102 102  CO2 21* 21*  GLUCOSE 191* 119*  BUN 52* 70*  CREATININE 1.62* 1.59*  CALCIUM 9.0 9.1   PT/INR No results for input(s): LABPROT, INR in the last 72 hours. CMP     Component Value Date/Time   NA 131* 05/06/2015 0612   K 4.9 05/06/2015 0612   CL 102 05/06/2015 0612   CO2 21* 05/06/2015 0612   GLUCOSE 119* 05/06/2015 0612   BUN 70* 05/06/2015 0612   CREATININE 1.59* 05/06/2015 0612   CREATININE 1.64* 06/27/2014 1647   CALCIUM 9.1 05/06/2015 0612   PROT 7.5 05/04/2015 0554   ALBUMIN 2.7* 05/04/2015 0554   AST 24 05/04/2015 0554   ALT 35 05/04/2015 0554   ALKPHOS 99 05/04/2015 0554   BILITOT 0.4 05/04/2015 0554   GFRNONAA 31* 05/06/2015 0612   GFRAA 36* 05/06/2015 0612   Lipase     Component Value Date/Time   LIPASE 27 12/26/2014 1815       Studies/Results: No results found.  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 -Ambulate and IS -SCD's and lovenox -Appreciate medicines help -May need to re address surgical interventions in 1-2 weeks to take down fistula if output not improved. Dr. Marlou Starks and Dr. Georgette Dover discussing. -Ordered CT scan with oral and IV contrast to see if we can localize the fistula in the small bowel  Gout flare (left ankle) - per medicine, prednisone, colchicine ABL anemia - Hgb stable Hyperkalemia - 4.9 CKD Stage 3 - Up to1.59 Disp - Continue inpatient today, await improvement in drain output so we can discontinue suction    LOS: 9 days    [name] 05/06/2015, 8:22 AM Pager: [phone]

## 2015-05-07 LAB — GLUCOSE, CAPILLARY
GLUCOSE-CAPILLARY: 91 mg/dL (ref 65–99)
GLUCOSE-CAPILLARY: 84 mg/dL (ref 65–99)
GLUCOSE-CAPILLARY: 108 mg/dL — AB (ref 65–99)
Glucose-Capillary: 124 mg/dL — ABNORMAL HIGH (ref 65–99)
Glucose-Capillary: 105 mg/dL — ABNORMAL HIGH (ref 65–99)
Glucose-Capillary: 138 mg/dL — ABNORMAL HIGH (ref 65–99)

## 2015-05-07 LAB — COMPREHENSIVE METABOLIC PANEL
ALBUMIN: 2.6 g/dL — AB (ref 3.5–5.0)
ALK PHOS: 85 U/L (ref 38–126)
ALT: 109 U/L — ABNORMAL HIGH (ref 14–54)
AST: 63 U/L — AB (ref 15–41)
Anion gap: 10 (ref 5–15)
BILIRUBIN TOTAL: 0.2 mg/dL — AB (ref 0.3–1.2)
BUN: 83 mg/dL — AB (ref 6–20)
CO2: 20 mmol/L — AB (ref 22–32)
Calcium: 9 mg/dL (ref 8.9–10.3)
Chloride: 101 mmol/L (ref 101–111)
Creatinine, Ser: 1.68 mg/dL — ABNORMAL HIGH (ref 0.44–1.00)
GFR calc Af Amer: 33 mL/min — ABNORMAL LOW (ref >60)
GFR calc non Af Amer: 29 mL/min — ABNORMAL LOW (ref >60)
GLUCOSE: 119 mg/dL — AB (ref 65–99)
POTASSIUM: 4.8 mmol/L (ref 3.5–5.1)
SODIUM: 131 mmol/L — AB (ref 135–145)
TOTAL PROTEIN: 7.3 g/dL (ref 6.5–8.1)

## 2015-05-07 LAB — CBC
HEMATOCRIT: 27.9 % — AB (ref 36.0–46.0)
Hemoglobin: 8.5 g/dL — ABNORMAL LOW (ref 12.0–15.0)
MCH: 21.8 pg — AB (ref 26.0–34.0)
MCHC: 30.5 g/dL (ref 30.0–36.0)
MCV: 71.5 fL — AB (ref 78.0–100.0)
PLATELETS: 288 10*3/uL (ref 150–400)
RBC: 3.9 MIL/uL (ref 3.87–5.11)
RDW: 15.7 % — ABNORMAL HIGH (ref 11.5–15.5)
WBC: 8.1 10*3/uL (ref 4.0–10.5)

## 2015-05-07 LAB — MAGNESIUM: Magnesium: 2.3 mg/dL (ref 1.7–2.4)

## 2015-05-07 LAB — PHOSPHORUS: Phosphorus: 4.7 mg/dL — ABNORMAL HIGH (ref 2.5–4.6)

## 2015-05-07 MED ORDER — FAT EMULSION 20 % IV EMUL
240.0000 mL | INTRAVENOUS | Status: AC
  Administered 2015-05-07: 240 mL via INTRAVENOUS
  Filled 2015-05-07: qty 250

## 2015-05-07 MED ORDER — SODIUM CHLORIDE 0.9 % IV SOLN
INTRAVENOUS | Status: AC
  Administered 2015-05-07 – 2015-05-09 (×4): via INTRAVENOUS

## 2015-05-07 MED ORDER — TRACE MINERALS CR-CU-MN-SE-ZN 10-1000-500-60 MCG/ML IV SOLN
INTRAVENOUS | Status: AC
  Administered 2015-05-07: 19:00:00 via INTRAVENOUS
  Filled 2015-05-07: qty 1800

## 2015-05-07 NOTE — Consult Note (Addendum)
WOC follow-up: Eakin pouch was applied 2 days ago and is still intact with suction in place, however had leaked behind the barrier onto skin when the pouch was removed. Large amt green drainage in the cannister from suction catheter, slightly thicker than yesterday. Penrose drain fell out completely and was removed. Wound area beefy red, remains painful to touch. Scar tissue below wound remains pink and moist with partial thickness skin loss from previous drainage; pt has a significant valley to this location which occurs when she sits forward. Scar tissue is high risk for further breakdown related to constant moisture. Left open to air to promote healing. Wound has decreased slightly in size; 6X4.5 cm with depth of 3.5cm as fistula location begins to manifest itself to 1:00 o'clock in the wound with a narrow tunneling area.  Applied regular ostomy bag with flexible convex opening to adhere around valley of wound with low wall suction and attached to bag with a suction catheter.This pouching system is not available in the [place] and supplies at bedside and instructions provided for staff nurses if leakage occurs; they can re-apply Eakin pouching sytem PRN. Julien Girt MSN, RN, CWOCN, CWCN-AP, CNS

## 2015-05-07 NOTE — Progress Notes (Signed)
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 The patient ultimately went for I&D and explantation of infected abdominal mesh on 04/28/2015. She also has PMH of CAD, s/p OM BMS in August 2007 with ISR treated with a DES April 2009, Cath Done in August 2009 showed patency of the stent, Myoview October 2015 was low risk and echo at that time showed EF 55-60 percent. Cardiology evaluated her during recent hospitalization, determined that her cardiac status was stable and cleared her for laparotomy on 03/20/15. She also has PMH of HTN, stage III chronic kidney disease, chronic diastolic CHF, gout, OSA, GERD, HLD, diet controlled DM, DVT-not on anticoagulation secondary to recurrent diverticular and gastric bleeding and iron deficiency anemia.  Assessment/Plan: #1 enterocutaneous fistula Status post abdominal wound exploration/explantation of infected abdominal mesh per Dr.Tsuei 04/28/2015. Patient is currently afebrile. Patient on TPN. Eakins pouch. Per primary team.  #2 hypertension Blood pressure was initially borderline and as such BP meds were held. Continue current regimen of low-dose Norvasc and metoprolol. Will hold off on  resuming ARB in the setting of acute on chronic renal failure.  #3 acute on chronic kidney disease stage III Patient's baseline creatinine was 1.1-1.4. Renal function trending back up. Will place on gentle hydration. Continue to hold ARB. Follow.    #4 acute on chronic diastolic CHF Stable. Compensated.   #5 obstructive sleep apnea CPAP daily at bedtime.  #6 gastroesophageal reflux disease PPI.  #7 well-controlled type 2 diabetes mellitus diet-controlled Hemoglobin A1c was 5.7 on 03/14/2015. CBGs have ranged from 84-124. Continue sliding scale insulin.  #8 history of DVT Not anticoagulation candidate secondary to recurrent diverticular gastric bleeding.  #9 iron deficiency anemia H&H stable. Continue iron supplementation.  #10 coronary artery disease Stable. Continue beta blocker.  #11 generalized weakness Likely secondary to deconditioning. UA negative for UTI. PT.  #12 left ankle pain/acute gouty flare Clinical improvement on prednisone. Continue prednisone taper.  #13 prophylaxis PPI for GI prophylaxis. Lovenox for DVT prophylaxis.  Code Status: Full Family Communication: Updated patient. No family at bedside. Disposition Plan: Per primary team.   Consultants:  [facility]:Dr Hongalgi 04/27/2015  Procedures:  Chest x-ray 04/27/2015  Abdominal wound exploration/explantation of infected abdominal mesh per Dr. Georgette Dover 04/28/2015  CT abdomen and pelvis 05/06/2015  X-ray left ankle 05/04/2015  Antibiotics:  None  HPI/Subjective: Patient denies any nausea or vomiting. Patient sitting up in chair. No chest pain. No shortness of breath.  Objective: Filed Vitals:   05/07/15 0953  BP: 113/57  Pulse: 77  Temp:   Resp:     Intake/Output Summary (Last 24 hours) at 05/07/15 1504 Last data filed at 05/07/15 1351  Gross per 24 hour  Intake 195.39 ml  Output   1295 ml  Net -1099.61 ml   Filed Weights   05/01/15 0805  Weight: 99.3 kg (218 lb 14.7  oz)    Exam:   General:  NAD  Cardiovascular: RRR  Respiratory: CTAB anterior lung fields  Abdomen: Soft, + bowel sounds, some tenderness to palpation.  Musculoskeletal: No clubbing cyanosis or edema.  Data Reviewed: Basic Metabolic Panel:  Recent Labs Lab 05/03/15 0630 05/04/15 0554 05/05/15 0557 05/06/15 0612 05/07/15 0602  NA 133* 131* 129* 131* 131*  K 4.6 5.6* 4.7 4.9 4.8  CL 103 103 102 102 101  CO2 24 20* 21* 21* 20*  GLUCOSE 114* 149* 191* 119* 119*  BUN 41* 46* 52* 70* 83*  CREATININE 1.34* 1.40* 1.62* 1.59* 1.68*  CALCIUM 8.8* 9.4 9.0 9.1 9.0  MG 2.0 2.0  --   --  2.3  PHOS  --  3.5  --   --  4.7*   Liver Function Tests:  Recent Labs Lab 05/04/15 0554 05/07/15 0602  AST 24 63*  ALT 35 109*  ALKPHOS 99 85  BILITOT 0.4 0.2*  PROT 7.5 7.3  ALBUMIN 2.7* 2.6*   No results for input(s): LIPASE, AMYLASE in the last 168 hours. No results for input(s): AMMONIA in the last 168 hours. CBC:  Recent Labs Lab 05/01/15 0503 05/04/15 0554 05/07/15 0602  WBC 6.2 8.5 8.1  NEUTROABS  --  7.3  --   HGB 7.4* 8.0* 8.5*  HCT 24.3* 25.9* 27.9*  MCV 73.2* 72.8* 71.5*  PLT 179 229 288   Cardiac Enzymes: No results for input(s): CKTOTAL, CKMB, CKMBINDEX, TROPONINI in the last 168 hours. BNP (last 3 results)  Recent Labs  11/02/14 2340 03/19/15 1325  BNP 109.2* 141.6*    ProBNP (last 3 results) No results for input(s): PROBNP in the last 8760 hours.  CBG:  Recent Labs Lab 05/06/15 1951 05/06/15 2236 05/07/15 0653 05/07/15 0745 05/07/15 1152  GLUCAP 213* 196* 91 84 108*    Recent Results (from the past 240 hour(s))  Surgical pcr screen     Status: None   Collection Time: 04/27/15  7:41 PM  Result Value Ref Range Status   MRSA, PCR NEGATIVE NEGATIVE Final   Staphylococcus aureus NEGATIVE NEGATIVE Final    Comment:        The Xpert SA Assay (FDA approved for NASAL specimens in patients over 21 years of age), is one component of a  comprehensive surveillance program.  Test performance has been validated by [facility] for patients greater than or equal to 79 year old. It is not intended to diagnose infection nor to guide or monitor treatment.   Urine culture     Status: None   Collection Time: 04/30/15  9:20 PM  Result Value Ref Range Status   Specimen Description URINE, RANDOM  Final   Special Requests NONE  Final   Culture MULTIPLE SPECIES PRESENT, SUGGEST RECOLLECTION  Final   Report Status 05/02/2015 FINAL  Final     Studies: Ct Abdomen Pelvis Wo Contrast  05/06/2015   CLINICAL DATA:  Enterocutaneous fistula  EXAM: CT ABDOMEN AND PELVIS WITHOUT CONTRAST  TECHNIQUE: Multidetector CT imaging of the abdomen and pelvis was performed following the standard protocol without IV contrast.  COMPARISON:  04/14/2015  FINDINGS: LOWER CHEST: Extensive coronary atherosclerosis.  ABDOMEN/PELVIS:  Liver: No focal abnormality.  Biliary: Cholecystectomy.  Pancreas: Unremarkable.  Spleen: Unremarkable.  Adrenals: Unremarkable.  Kidneys and ureters: No hydronephrosis or stone. Stable lobulation of the bilateral kidneys.  Bladder: Unremarkable.  Reproductive: Hysterectomy probable oophorectomies.  Bowel: Defect in the supraumbilical midline abdominal wall with internal folded drain is surrounded by enteric contrast from enterocutaneous fistula. On sagittal imaging,  a small bowel fistulous tract is visible along the lower margin of the abdominal wall defect. The defect in the bowel wall is channel like. Multiple small bowel loops are matted underneath the diastatic midline abdominal wall. No evidence of bowel obstruction. There is colonic and proximal small bowel diverticulosis.  Retroperitoneum: No mass or adenopathy.  Peritoneum: No ascites or pneumoperitoneum.  Vascular: No acute abnormality.  OSSEOUS: Extensive spondylosis and facet arthropathy.  IMPRESSION: 1. Mid small bowel enterocutaneous fistula draining into the lower margin of the  abdominal wall defect. 2. Extensive small bowel adhesions to the diastatic abdominal wall. 3. Colonic and small bowel diverticulosis.   Electronically Signed   By: Monte Fantasia M.D.   On: 05/06/2015 15:18    Scheduled Meds: . amLODipine  2.5 mg Oral Daily  . Chlorhexidine Gluconate Cloth  6 each Topical Q0600  . enoxaparin (LOVENOX) injection  40 mg Subcutaneous Q24H  . feeding supplement  1 Container Oral TID BM  . insulin aspart  0-15 Units Subcutaneous 4 times per day  . iron polysaccharides  150 mg Oral Daily  . metoprolol tartrate  25 mg Oral q morning - 10a  . mupirocin ointment   Nasal BID  . octreotide  100 mcg Subcutaneous 3 times per day  . pantoprazole (PROTONIX) IV  40 mg Intravenous QHS  . cyanocobalamin  2,000 mcg Oral Daily   Continuous Infusions: . sodium chloride 10 mL/hr at 04/30/15 1115  . sodium chloride 75 mL/hr at 05/07/15 1037  . Marland KitchenTPN (CLINIMIX-E) Adult     And  . fat emulsion      Principal Problem:   Enterocutaneous fistula Active Problems:   OSA (obstructive sleep apnea)   Essential hypertension   CAD S/P OM BMS 8/07, ISR-OM DES 8/09   GERD   Chronic kidney disease, stage 3   DVT, HX OF   Hyperlipidemia with target LDL less than 100   Anemia, iron deficiency   Type 2 diabetes mellitus without complication   Hyperkalemia   Acute idiopathic gout of left ankle    Time spent: 34 minutes    Reily Ilic M.D. [facility] Pager [phone]. If 7PM-7AM, please contact night-coverage at [email or web address], password [facility] 05/07/2015, 3:04 PM  LOS: 10 days

## 2015-05-07 NOTE — Progress Notes (Signed)
Pt. Refused cpap. Pt. States she does not need one tonight and that she will notify if she changes her mind.

## 2015-05-07 NOTE — Progress Notes (Signed)
PARENTERAL NUTRITION CONSULT NOTE - FOLLOW UP  Pharmacy Consult for TPN Indication: EC fistula  No Known Allergies  Patient Measurements: Height: 5\' 4"  (162.6 cm) Weight: 218 lb 14.7 oz (99.3 kg) IBW/kg (Calculated) : 54.7  Weight 99.3 kg  Vital Signs: Temp: 97.8 F (36.6 C) (08/25 0608) Temp Source: Oral (08/25 4403) BP: 106/62 mmHg (08/25 4742) Pulse Rate: 74 (08/25 0608) Intake/Output from previous day: 08/24 0701 - 08/25 0700 In: 0  Out: 1250 [Urine:350; Drains:900] Intake/Output from this shift:   Labs:  Recent Labs  05/07/15 0602  WBC 8.1  HGB 8.5*  HCT 27.9*  PLT 288    Recent Labs  05/05/15 0557 05/06/15 0612 05/07/15 0602  NA 129* 131* 131*  K 4.7 4.9 4.8  CL 102 102 101  CO2 21* 21* 20*  GLUCOSE 191* 119* 119*  BUN 52* 70* 83*  CREATININE 1.62* 1.59* 1.68*  CALCIUM 9.0 9.1 9.0  MG  --   --  2.3  PHOS  --   --  4.7*  PROT  --   --  7.3  ALBUMIN  --   --  2.6*  AST  --   --  63*  ALT  --   --  109*  ALKPHOS  --   --  85  BILITOT  --   --  0.2*   Estimated Creatinine Clearance: 33.6 mL/min (by C-G formula based on Cr of 1.68).   Recent Labs  05/06/15 2236 05/07/15 0653 05/07/15 0745  GLUCAP 196* 91 84   Medications:  Infusions:  . sodium chloride 10 mL/hr at 04/30/15 1115  . sodium chloride     Insulin Requirements in the past 24 hours:  11 units of SSI  Nutritional Goals: per RD on 8/22 Protein: 100-110 g  Kcal: 1700-1900  Fluid: 1.7-1.9 L  Current Nutrition:  Chronic TPN per [place] kcal, 85g protein - my appreciation to Carolynn Sayers for her documentation of home TPN.  Cyclic Clinimix E 5/95: 56ml/hr x 1 hour, 160 ml/hr x 10 hours, 50 ml/hr x 1 hour. 20% IVFE at 20 ml/hr x 12 hours. This will provide 1672 kcal and 84g protein. Boost TID ordered (NPO)  NPO  Assessment:  Surgeries/Procedures: s/p abdominal wound exploration with removal of infected abdominal mesh 8/16  GI: EC fistula, abdominal wound.  Drain in place with 926ml output yesterday which increased from the day before. Sandostatin added on 8/22 to help with closure and increased on 8/24. If unable to help close, may need to address surgical interventions to close fistula. No N/V or abdominal pain. Does have some flatus and last BM was 8/22.   Endo: CBGs controlled off TPN (80-100s) but elevated (190-210s) on TPN. SSI. Will try and decrease 10hr rate slightly to see if it helps control CBGs better  Lytes: Lytes wnl exc Na 131, CoCa 9.8. Phos 4.7 and Mg ok on 8/22. Ca x Phos product = < 55  Renal: SCr elevated at 1.59, Normalized crcl ~51ml/min.  UOP 0.8 ml/kg/hr.  No supplemental IV fluids in addition to her TPN at home. NS @ KVO. I/O about equal as no ins charted yesterday.  Pulm: OSA  Cards: VSS, metoprolol  Hepatobil: LFTs slightly elevated. TG 45, Tbili 0.2. Prealbumin 21. Albumin low at 2.6  Neuro: ok  ID: no antibiotics. Afebrile and WBC wnl.  Best Practices: lovenox, PPI IV  TPN Access: PICC TPN start date: chronic  Plan:  Cycle Clinimix E 5/15: Total  volume of 1800 mL over 12 hours. 57ml/hr x 1 hour, 165 ml/hr x 10 hours, 53ml/hr x 1 hour.  Cycle 20% IVFE at 20 ml/hr x 12 hours. TPN + IVFE will provide 1758 kcal (100% of needs) and 90 g of protein (90% of needs)  Continue MVI and trace elements in TPN Continue moderate SSI Continue IV fluids at [facility] Monitor volume status, cyclic TPN tolerance F/U TPN labs  Elenor Quinones, PharmD Clinical Pharmacist Pager [phone] 05/07/2015 9:26 AM

## 2015-05-07 NOTE — Progress Notes (Signed)
9 Days Post-Op  Subjective: No real change, no pain drainage is going into the cannister now with the suction.    Objective: Vital signs in last 24 hours: Temp:  [97.3 F (36.3 C)-97.8 F (36.6 C)] 97.8 F (36.6 C) (08/25 8786) Pulse Rate:  [63-77] 77 (08/25 0953) Resp:  [18] 18 (08/25 0608) BP: (106-146)/(57-74) 113/57 mmHg (08/25 0953) SpO2:  [100 %] 100 % (08/25 0608) Last BM Date: 05/04/15 NPO  On TNA 900 from the fistula yesterday, 575, recorded day prior. 350 urine recorded. Afebrile, VSS Na 131, creatinine is stable Cbc is stable Intake/Output from previous day: 08/24 0701 - 08/25 0700 In: 0  Out: 1250 [Urine:350; Drains:900] Intake/Output this shift:    General appearance: alert, cooperative and no distress Resp: clear to auscultation bilaterally GI: soft, non-tender; bowel sounds normal; no masses,  no organomegaly and fistula drainage is going from bag to suction cannister.  No pain or discomfort on exam  Lab Results:   Recent Labs  05/07/15 0602  WBC 8.1  HGB 8.5*  HCT 27.9*  PLT 288    BMET  Recent Labs  05/06/15 0612 05/07/15 0602  NA 131* 131*  K 4.9 4.8  CL 102 101  CO2 21* 20*  GLUCOSE 119* 119*  BUN 70* 83*  CREATININE 1.59* 1.68*  CALCIUM 9.1 9.0   PT/INR No results for input(s): LABPROT, INR in the last 72 hours.   Recent Labs Lab 05/04/15 0554 05/07/15 0602  AST 24 63*  ALT 35 109*  ALKPHOS 99 85  BILITOT 0.4 0.2*  PROT 7.5 7.3  ALBUMIN 2.7* 2.6*     Lipase     Component Value Date/Time   LIPASE 27 12/26/2014 1815     Studies/Results: Ct Abdomen Pelvis Wo Contrast  05/06/2015   CLINICAL DATA:  Enterocutaneous fistula  EXAM: CT ABDOMEN AND PELVIS WITHOUT CONTRAST  TECHNIQUE: Multidetector CT imaging of the abdomen and pelvis was performed following the standard protocol without IV contrast.  COMPARISON:  04/14/2015  FINDINGS: LOWER CHEST: Extensive coronary atherosclerosis.  ABDOMEN/PELVIS:  Liver: No focal  abnormality.  Biliary: Cholecystectomy.  Pancreas: Unremarkable.  Spleen: Unremarkable.  Adrenals: Unremarkable.  Kidneys and ureters: No hydronephrosis or stone. Stable lobulation of the bilateral kidneys.  Bladder: Unremarkable.  Reproductive: Hysterectomy probable oophorectomies.  Bowel: Defect in the supraumbilical midline abdominal wall with internal folded drain is surrounded by enteric contrast from enterocutaneous fistula. On sagittal imaging, a small bowel fistulous tract is visible along the lower margin of the abdominal wall defect. The defect in the bowel wall is channel like. Multiple small bowel loops are matted underneath the diastatic midline abdominal wall. No evidence of bowel obstruction. There is colonic and proximal small bowel diverticulosis.  Retroperitoneum: No mass or adenopathy.  Peritoneum: No ascites or pneumoperitoneum.  Vascular: No acute abnormality.  OSSEOUS: Extensive spondylosis and facet arthropathy.  IMPRESSION: 1. Mid small bowel enterocutaneous fistula draining into the lower margin of the abdominal wall defect. 2. Extensive small bowel adhesions to the diastatic abdominal wall. 3. Colonic and small bowel diverticulosis.   Electronically Signed   By: Monte Fantasia M.D.   On: 05/06/2015 15:18    Medications: . amLODipine  2.5 mg Oral Daily  . Chlorhexidine Gluconate Cloth  6 each Topical Q0600  . enoxaparin (LOVENOX) injection  40 mg Subcutaneous Q24H  . feeding supplement  1 Container Oral TID BM  . insulin aspart  0-15 Units Subcutaneous 4 times per day  . iron polysaccharides  150 mg Oral Daily  . metoprolol tartrate  25 mg Oral q morning - 10a  . mupirocin ointment   Nasal BID  . octreotide  100 mcg Subcutaneous 3 times per day  . pantoprazole (PROTONIX) IV  40 mg Intravenous QHS  . predniSONE  20 mg Oral Once  . cyanocobalamin  2,000 mcg Oral Daily   . sodium chloride 10 mL/hr at 04/30/15 1115  . sodium chloride 75 mL/hr at 05/07/15 1037  . Marland KitchenTPN  (CLINIMIX-E) Adult     And  . fat emulsion      Assessment/Plan Enterocutaneous fistula with infected abdominal mesh S/p Abdominal wound exploration/ explantation of infected abdominal mesh, 04/28/15, Dr. Georgette Dover  POD 9 S/p left hemicolectomy; Dr. Lucia Gaskins 2003, Incarcerated hernia repair, Dr. Hassell Done 2009, hysterectomy with post op EC fistula, conservative management CKD stage III Hx of hypertension AODM Chronic anemia  Antibtiotics:  None  Dx of DVT:  Lovenox/SCD   Plan:  No improvement in drainage, on Octreotide 100 mg TID.  Will discuss with Dr. Marlou Starks.    LOS: 10 days    Antonyo Hinderer 05/07/2015

## 2015-05-07 NOTE — Care Management Note (Signed)
Case Management Note  Patient Details  Name: Nyiah Pianka MRN: [id number] Date of Birth: 1941/04/18  Subjective/Objective:   Pt with enterocutaneous fistula s/p surgery on 04/30/15.  PTA, pt resided at home with daughter.  She is active with [facility] for home TPN.                    Action/Plan: [facility] cont to follow for home needs.  Plan dc home when drainage decreases from abdominal wound.    Expected Discharge Date:                  Expected Discharge Plan:  [place]  In-House Referral:     Discharge planning Services  CM Consult  Post Acute Care Choice:  Home Health Choice offered to:  Patient  DME Arranged:    DME Agency:  [place]:  RN [facility] Agency:  [place]  Status of Service:     Medicare Important Message Given:  Yes-third notification given Date Medicare IM Given:    Medicare IM give by:    Date Additional Medicare IM Given:    Additional Medicare Important Message give by:     If discussed at [place] of Stay Meetings, dates discussed:    Additional Comments:  Reinaldo Raddle, RN, BSN  Trauma/Neuro ICU Case Manager [phone]

## 2015-05-08 LAB — GLUCOSE, CAPILLARY
GLUCOSE-CAPILLARY: 153 mg/dL — AB (ref 65–99)
GLUCOSE-CAPILLARY: 128 mg/dL — AB (ref 65–99)
Glucose-Capillary: 119 mg/dL — ABNORMAL HIGH (ref 65–99)
Glucose-Capillary: 101 mg/dL — ABNORMAL HIGH (ref 65–99)
Glucose-Capillary: 106 mg/dL — ABNORMAL HIGH (ref 65–99)

## 2015-05-08 LAB — BASIC METABOLIC PANEL
ANION GAP: 7 (ref 5–15)
BUN: 77 mg/dL — AB (ref 6–20)
CALCIUM: 8.4 mg/dL — AB (ref 8.9–10.3)
CO2: 20 mmol/L — AB (ref 22–32)
Chloride: 103 mmol/L (ref 101–111)
Creatinine, Ser: 1.68 mg/dL — ABNORMAL HIGH (ref 0.44–1.00)
GFR calc Af Amer: 33 mL/min — ABNORMAL LOW (ref >60)
GFR calc non Af Amer: 29 mL/min — ABNORMAL LOW (ref >60)
GLUCOSE: 151 mg/dL — AB (ref 65–99)
POTASSIUM: 4.6 mmol/L (ref 3.5–5.1)
Sodium: 130 mmol/L — ABNORMAL LOW (ref 135–145)

## 2015-05-08 LAB — CBC
HEMATOCRIT: 27.1 % — AB (ref 36.0–46.0)
HEMOGLOBIN: 8.3 g/dL — AB (ref 12.0–15.0)
MCH: 22.2 pg — AB (ref 26.0–34.0)
MCHC: 30.6 g/dL (ref 30.0–36.0)
MCV: 72.5 fL — ABNORMAL LOW (ref 78.0–100.0)
Platelets: 252 10*3/uL (ref 150–400)
RBC: 3.74 MIL/uL — ABNORMAL LOW (ref 3.87–5.11)
RDW: 15.9 % — ABNORMAL HIGH (ref 11.5–15.5)
WBC: 8.4 10*3/uL (ref 4.0–10.5)

## 2015-05-08 MED ORDER — FAT EMULSION 20 % IV EMUL
240.0000 mL | INTRAVENOUS | Status: AC
  Administered 2015-05-08 (×2): 240 mL via INTRAVENOUS
  Filled 2015-05-08: qty 250

## 2015-05-08 MED ORDER — M.V.I. ADULT IV INJ
INJECTION | INTRAVENOUS | Status: AC
  Administered 2015-05-08: 17:00:00 via INTRAVENOUS
  Filled 2015-05-08: qty 1800

## 2015-05-08 NOTE — Consult Note (Addendum)
WOC follow-up:  Flexible convex pouch was applied yesterday AM, and began leaking early this AM. behind the barrier onto the lower abd skin. Large amt green drainage in the cannister from suction catheter,  thicker than yesterday. Wound area beefy red, remains painful to touch. Scar tissue below wound remains pink and moist with partial thickness skin loss from previous drainage; pt has a significant valley to this location which occurs when she sits forward. Scar tissue beginning to break down related to constant moisture. Fistula location has to manifested itself to 1:00 o'clock in the wound with a narrow tunneling area. Applied regular ostomy bag with flexible convex opening to adhere around valley of wound with low wall suction and attached to bag with a suction catheter.This pouching system is not available in the [place] and supplies at bedside and instructions provided for staff nurses if leakage occurs; they can re-apply Eakin pouching sytem PRN. Julien Girt MSN, RN, CWOCN, CWCN-AP, CNS

## 2015-05-08 NOTE — Progress Notes (Signed)
Physical Therapy Treatment & Discharge Patient Details Name: Marissa [name] MRN: [id number] DOB: [birth year] Today's Date: May 09, 2015    History of Present Illness 75 y.o. female admitted with enterocutaneous fistual. s/p s/p Abdominal wound exploration.    PT Comments    Patient has met PT goals.  No further skilled intervention at this time.  Encouraged to walk with nursing over the weekend.  Safe to ambulate independent if no lines present.  Follow Up Recommendations  No PT follow up;Supervision - Intermittent     Equipment Recommendations  None recommended by PT    Recommendations for Other Services       Precautions / Restrictions Precautions Precaution Comments: drain from abdominal wound    Mobility  Bed Mobility Overal bed mobility: Modified Independent             General bed mobility comments: uses rail   Transfers Overall transfer level: Modified independent Equipment used: None                Ambulation/Gait Ambulation/Gait assistance: Independent Ambulation Distance (Feet): 800 Feet Assistive device: None       General Gait Details: c/o LE fatigue with ambulation, stops 4 times along the way to straighten up. but remains flexed with ambulation    Stairs            Wheelchair Mobility    Modified Rankin (Stroke Patients Only)       Balance             Standing balance-Leahy Scale: Good                      Cognition Arousal/Alertness: Awake/alert Behavior During Therapy: WFL for tasks assessed/performed Overall Cognitive Status: Within Functional Limits for tasks assessed                      Exercises      General Comments        Pertinent Vitals/Pain Pain Assessment: No/denies pain    Home Living                      Prior Function            PT Goals (current goals can now be found in the care plan section) Progress towards PT goals: Goals met/education completed, patient  discharged from PT    Frequency  Other (Comment) (d/c PT, all goals met)    PT Plan Current plan remains appropriate    Co-evaluation             End of Session   Activity Tolerance: Patient tolerated treatment well Patient left: in bed;with call bell/phone within reach     Time: 0240-9735 PT Time Calculation (min) (ACUTE ONLY): 21 min  Charges:  $Gait Training: 8-22 mins                    G Codes:      Marissa [name],Marissa [name] 09-May-2015, 4:54 PM  Marissa [name], Marissa [name] 05/09/15

## 2015-05-08 NOTE — Care Management Important Message (Signed)
Important Message  Patient Details  Name: Marissa [name] MRN: [id number] Date of Birth: Jan 17, 1941   Medicare Important Message Given:  Yes-fourth notification given    Ella Bodo, RN 05/08/2015, 5:21 PM

## 2015-05-08 NOTE — Progress Notes (Signed)
Nutrition Follow-up  DOCUMENTATION CODES:   Obesity unspecified  INTERVENTION:   -TPN management per pharmacy -RD will follow for diet advancement and supplement diet as appropriate  NUTRITION DIAGNOSIS:   Inadequate oral intake related to inability to eat as evidenced by NPO status.  Ongoing  GOAL:   Patient will meet greater than or equal to 90% of their needs  Met with TPN  MONITOR:   Diet advancement, PO intake, Labs, Weight trends, Skin, I & O's  REASON FOR ASSESSMENT:   Malnutrition Screening Tool, Consult New TPN/TNA  ASSESSMENT:   The patient is a 74 year old female presenting to discuss diagnostic procedure results. The patient is a 74 year old female who presents with a subcutaneous abscess and a very complex past surgical history.   S/p Procedure 04/29/17:  Abdominal wound exploration/ explantation of infected abdominal mesh  Spoke with RN, who confirms NPO status. Pt remains on TPN, tolerating well per RN.  Reviewed pharmacy note form 05/08/15. Cyclic TPN regimen continues to infuse via PICC: Cycle Clinimix E 5/15: Total volume of 1800 mL over 12 hours. 38ml/hr x 1 hour, 170 ml/hr x 10 hours, 50 ml/hr x 1 hour. Cycle 20% IVFE at 20 ml/hr x 12 hours. TPN + IVFE will provide 1758 kcal (100% of needs) and 90 g of protein (90% of needs).   Reviewed [facility] note from 05/08/15; pt transitioned to non-formulary regular ostomy bag with flexible convex opening. Per RN, large amounts of drainage from fistula continues to be a challenge. Eakin pouch remains to suction. Per doc flowsheets, 530 ml output within the past 24 hours. Penrose drain fell out and was d/c on 05/07/15.   Per RNCM, pt will d/c home with [facility] home [facility] services once drainage from abdominal wound decreases.   Labs reviewed: Na: 130 (on IV supplementation). K WDL.   Diet Order:  Diet NPO time specified Except for: Ice Chips, Sips with Meds TPN (CLINIMIX-E) Adult  Skin:  Wound (see comment)  (abdominal wound with 2 openings, enterocutaneous fistula)  Last BM:  05/04/15  Height:   Ht Readings from Last 1 Encounters:  05/01/15 $RemoveB'5\' 4"'[id number]$  (1.626 m)    Weight:   Wt Readings from Last 1 Encounters:  05/01/15 218 lb 14.7 oz (99.3 kg)    Ideal Body Weight:  54.5 kg  BMI:  Body mass index is 37.56 kg/(m^2).  Estimated Nutritional Needs:   Kcal:  1700-1900  Protein:  100-110 grams  Fluid:  1.7-1.9 L  EDUCATION NEEDS:   No education needs identified at this time  Cassidey Barrales A. Jimmye Norman, RD, LDN, CDE Pager: [phone] After hours Pager: [phone]

## 2015-05-08 NOTE — Progress Notes (Signed)
PARENTERAL NUTRITION CONSULT NOTE - FOLLOW UP  Pharmacy Consult for TPN Indication: EC fistula  No Known Allergies  Patient Measurements: Height: 5\' 4"  (162.6 cm) Weight: 218 lb 14.7 oz (99.3 kg) IBW/kg (Calculated) : 54.7  Weight 99.3 kg  Vital Signs: Temp: 97.7 F (36.5 C) (08/26 0613) Temp Source: Oral (08/26 8563) BP: 120/56 mmHg (08/26 1497) Pulse Rate: 64 (08/26 0613) Intake/Output from previous day: 08/25 0701 - 08/26 0700 In: 626.4 [I.V.:626.4] Out: 1970 [Urine:1440; Drains:530] Intake/Output from this shift:   Labs:  Recent Labs  05/07/15 0602 05/08/15 0846  WBC 8.1 8.4  HGB 8.5* 8.3*  HCT 27.9* 27.1*  PLT 288 252    Recent Labs  05/06/15 0612 05/07/15 0602 05/08/15 0846  NA 131* 131* 130*  K 4.9 4.8 4.6  CL 102 101 103  CO2 21* 20* 20*  GLUCOSE 119* 119* 151*  BUN 70* 83* 77*  CREATININE 1.59* 1.68* 1.68*  CALCIUM 9.1 9.0 8.4*  MG  --  2.3  --   PHOS  --  4.7*  --   PROT  --  7.3  --   ALBUMIN  --  2.6*  --   AST  --  63*  --   ALT  --  109*  --   ALKPHOS  --  85  --   BILITOT  --  0.2*  --    Estimated Creatinine Clearance: 33.6 mL/min (by C-G formula based on Cr of 1.68).   Recent Labs  05/07/15 2309 05/08/15 0637 05/08/15 0748  GLUCAP 138* 119* 101*   Medications:  Infusions:  . sodium chloride 75 mL/hr at 05/08/15 0033   Insulin Requirements in the past 24 hours:  2 units of SSI  Nutritional Goals: per RD on 8/22 Protein: 100-110 g  Kcal: 1700-1900  Fluid: 1.7-1.9 L  Current Nutrition:  Chronic TPN per [place] kcal, 85g protein - my appreciation to Carolynn Sayers for her documentation of home TPN.  Cyclic Clinimix E 0/26: 85ml/hr x 1 hour, 165 ml/hr x 10 hours, 50 ml/hr x 1 hour. 20% IVFE at 20 ml/hr x 12 hours. This will provide 1672 kcal and 84g protein. Boost TID ordered (NPO)  NPO  Assessment:  Surgeries/Procedures: s/p abdominal wound exploration with removal of infected abdominal mesh  8/16  GI: EC fistula, abdominal wound. Drain in place with 922ml output yesterday which increased from the day before. Sandostatin added on 8/22 to help with closure and increased on 8/24. If unable to help close, may need to address surgical interventions to close fistula. No N/V or abdominal pain. Does have some flatus and last BM was 8/22.  Endo: CBGs well controlled requiring minimal SSI Lytes: Lytes wnl exc Na 130 Renal: SCr elevated at 1.68,  UOP 0.6 ml/kg/hr. MD ordered NS at 10ml/hr to run until 8/27 AM- stop time in place (she is not on supplemental IV fluids in addition to her TPN at home) Pulm: 100% RA Cards: BP 120/56, HR 64 - amlodipine, metoprolol Hepatobil: LFTs slightly elevated. TG 45, Tbili 0.2. Prealbumin 21. Albumin low at 2.6 Neuro: A&O ID: Afebrile, WBC WNL, no abx Best Practices: lovenox, PPI IV  TPN Access: PICC TPN start date: chronic  Plan:  - Continue cyclic Clinimix E 3/78 - Total volume of 1800 mL over 12 hours. 61ml/hr x 1 hour, 165 ml/hr x 10 hours, 93ml/hr x 1 hour.  - Continue cyclic 58% IVFE at 20 ml/hr x 12 hours. - TPN +  IVFE will provide 1758 kcal (100% of needs) and 90 g of protein (90% of needs)  - Continue MVI and trace elements in TPN - Continue moderate SSI - may be able to DC soon if continues to tolerate TPN - F/u plans for maintenance IV fluids - if continuing beyond tomorrow morning, would recommend running them only when TPN is off  Salome Arnt, PharmD, BCPS Pager # [phone] 05/08/2015 10:08 AM

## 2015-05-08 NOTE — Progress Notes (Signed)
[facility] PROGRESS NOTE/Consult  Marissa [name] VAN:[id number] DOB: 03-Jun-1941 DOA: 04/27/2015 PCP: [name], MD  Brief History 74 year old female patient with extensive past surgical history-left hemicolectomy with left transverse colon to sigmoid colon anastomosis/appendectomy/umbilical hernia repair (2003), repair of incarcerated ventral incisional hernia (2009), TAH with bilateral salpingo-oophorectomy with extensive enterolysis in [id number] complicated by enterocutaneous fistula (2013) which subsequently healed, June 2016 admitted again for large subcutaneous abscess and enterocutaneous fistula-managed with Eaken's pouch, bowel rest, TPN and Invanz, followed up by primary surgeon in office and due to concern for persisting underlying abscess patient admitted to hospital on 04/27/15 with plans for I & D. The patient ultimately went for I&D and explantation of infected abdominal mesh on 04/28/2015. She also has PMH of CAD, s/p OM BMS in August 2007 with ISR treated with a DES April 2009, Cath Done in August 2009 showed patency of the stent, Myoview October 2015 was low risk and echo at that time showed EF 55-60 percent. Cardiology evaluated her during recent hospitalization, determined that her cardiac status was stable and cleared her for laparotomy on 03/20/15. She also has PMH of HTN, stage III chronic kidney disease, chronic diastolic CHF, gout, OSA, GERD, HLD, diet controlled DM, DVT-not on anticoagulation secondary to recurrent diverticular and gastric bleeding and iron deficiency anemia.  Assessment/Plan: #1 enterocutaneous fistula Status post abdominal wound exploration/explantation of infected abdominal mesh per Dr.Tsuei 04/28/2015. Patient is currently afebrile. Patient on TPN. Eakins pouch was leaking early on this morning however seems to have stabilized and may have been changed. Per primary team.  #2 hypertension Blood pressure was initially borderline and as such BP meds  were held. Continue current regimen of low-dose Norvasc and metoprolol. Will hold off on resuming ARB in the setting of acute on chronic renal failure.  #3 acute on chronic kidney disease stage III Patient's baseline creatinine was 1.1-1.4. Renal function trending seems to have plateaued and at 1.68. Continue gentle hydration. Continue to hold ARB. Follow.    #4 acute on chronic diastolic CHF Stable. Compensated.   #5 obstructive sleep apnea CPAP daily at bedtime.  #6 gastroesophageal reflux disease PPI.  #7 well-controlled type 2 diabetes mellitus diet-controlled Hemoglobin A1c was 5.7 on 03/14/2015. CBGs have ranged from 101-138. Continue sliding scale insulin.  #8 history of DVT Not anticoagulation candidate secondary to recurrent diverticular gastric bleeding.  #9 iron deficiency anemia H&H stable. Continue iron supplementation.  #10 coronary artery disease Stable. Continue beta blocker.  #11 generalized weakness Likely secondary to deconditioning. UA negative for UTI. PT.  #12 left ankle pain/acute gouty flare Clinical improvement on prednisone. Continue prednisone taper.  #13 prophylaxis PPI for GI prophylaxis. Lovenox for DVT prophylaxis.  Code Status: Full Family Communication: Updated patient. No family at bedside. Disposition Plan: Per primary team.   Consultants:  [facility]:Dr Hongalgi 04/27/2015  Procedures:  Chest x-ray 04/27/2015  Abdominal wound exploration/explantation of infected abdominal mesh per Dr. Georgette Dover 04/28/2015  CT abdomen and pelvis 05/06/2015  X-ray left ankle 05/04/2015  Antibiotics:  None  HPI/Subjective: Patient denies any nausea or vomiting. No chest pain. No shortness of breath. Eakin pouch was leaking this morning per patient.  Objective: Filed Vitals:   05/08/15 1300  BP: 118/53  Pulse: 65  Temp: 87.2 F (30.7 C)  Resp: 18    Intake/Output Summary (Last 24 hours) at 05/08/15 1450 Last data filed at  05/08/15 1030  Gross per 24 hour  Intake    431 ml  Output   1750 ml  Net  -1319 ml   Filed Weights   05/01/15 0805  Weight: 99.3 kg (218 lb 14.7 oz)    Exam:   General:  NAD  Cardiovascular: RRR  Respiratory: CTAB anterior lung fields  Abdomen: Soft, + bowel sounds, some tenderness to palpation.Eakin pouch intact.  Musculoskeletal: No clubbing cyanosis or edema.  Data Reviewed: Basic Metabolic Panel:  Recent Labs Lab 05/03/15 0630 05/04/15 0554 05/05/15 0557 05/06/15 0612 05/07/15 0602 05/08/15 0846  NA 133* 131* 129* 131* 131* 130*  K 4.6 5.6* 4.7 4.9 4.8 4.6  CL 103 103 102 102 101 103  CO2 24 20* 21* 21* 20* 20*  GLUCOSE 114* 149* 191* 119* 119* 151*  BUN 41* 46* 52* 70* 83* 77*  CREATININE 1.34* 1.40* 1.62* 1.59* 1.68* 1.68*  CALCIUM 8.8* 9.4 9.0 9.1 9.0 8.4*  MG 2.0 2.0  --   --  2.3  --   PHOS  --  3.5  --   --  4.7*  --    Liver Function Tests:  Recent Labs Lab 05/04/15 0554 05/07/15 0602  AST 24 63*  ALT 35 109*  ALKPHOS 99 85  BILITOT 0.4 0.2*  PROT 7.5 7.3  ALBUMIN 2.7* 2.6*   No results for input(s): LIPASE, AMYLASE in the last 168 hours. No results for input(s): AMMONIA in the last 168 hours. CBC:  Recent Labs Lab 05/04/15 0554 05/07/15 0602 05/08/15 0846  WBC 8.5 8.1 8.4  NEUTROABS 7.3  --   --   HGB 8.0* 8.5* 8.3*  HCT 25.9* 27.9* 27.1*  MCV 72.8* 71.5* 72.5*  PLT 229 288 252   Cardiac Enzymes: No results for input(s): CKTOTAL, CKMB, CKMBINDEX, TROPONINI in the last 168 hours. BNP (last 3 results)  Recent Labs  11/02/14 2340 03/19/15 1325  BNP 109.2* 141.6*    ProBNP (last 3 results) No results for input(s): PROBNP in the last 8760 hours.  CBG:  Recent Labs Lab 05/07/15 2005 05/07/15 2309 05/08/15 0637 05/08/15 0748 05/08/15 1158  GLUCAP 105* 138* 119* 101* 106*    Recent Results (from the past 240 hour(s))  Urine culture     Status: None   Collection Time: 04/30/15  9:20 PM  Result Value Ref Range  Status   Specimen Description URINE, RANDOM  Final   Special Requests NONE  Final   Culture MULTIPLE SPECIES PRESENT, SUGGEST RECOLLECTION  Final   Report Status 05/02/2015 FINAL  Final     Studies: Ct Abdomen Pelvis Wo Contrast  05/06/2015   CLINICAL DATA:  Enterocutaneous fistula  EXAM: CT ABDOMEN AND PELVIS WITHOUT CONTRAST  TECHNIQUE: Multidetector CT imaging of the abdomen and pelvis was performed following the standard protocol without IV contrast.  COMPARISON:  04/14/2015  FINDINGS: LOWER CHEST: Extensive coronary atherosclerosis.  ABDOMEN/PELVIS:  Liver: No focal abnormality.  Biliary: Cholecystectomy.  Pancreas: Unremarkable.  Spleen: Unremarkable.  Adrenals: Unremarkable.  Kidneys and ureters: No hydronephrosis or stone. Stable lobulation of the bilateral kidneys.  Bladder: Unremarkable.  Reproductive: Hysterectomy probable oophorectomies.  Bowel: Defect in the supraumbilical midline abdominal wall with internal folded drain is surrounded by enteric contrast from enterocutaneous fistula. On sagittal imaging, a small bowel fistulous tract is visible along the lower margin of the abdominal wall defect. The defect in the bowel wall is channel like. Multiple small bowel loops are matted underneath the diastatic midline abdominal wall. No evidence of bowel obstruction. There is colonic and proximal small bowel diverticulosis.  Retroperitoneum: No mass or adenopathy.  Peritoneum: No ascites  or pneumoperitoneum.  Vascular: No acute abnormality.  OSSEOUS: Extensive spondylosis and facet arthropathy.  IMPRESSION: 1. Mid small bowel enterocutaneous fistula draining into the lower margin of the abdominal wall defect. 2. Extensive small bowel adhesions to the diastatic abdominal wall. 3. Colonic and small bowel diverticulosis.   Electronically Signed   By: Monte Fantasia M.D.   On: 05/06/2015 15:18    Scheduled Meds: . amLODipine  2.5 mg Oral Daily  . Chlorhexidine Gluconate Cloth  6 each Topical Q0600   . enoxaparin (LOVENOX) injection  40 mg Subcutaneous Q24H  . insulin aspart  0-15 Units Subcutaneous 4 times per day  . iron polysaccharides  150 mg Oral Daily  . metoprolol tartrate  25 mg Oral q morning - 10a  . mupirocin ointment   Nasal BID  . octreotide  100 mcg Subcutaneous 3 times per day  . pantoprazole (PROTONIX) IV  40 mg Intravenous QHS  . cyanocobalamin  2,000 mcg Oral Daily   Continuous Infusions: . sodium chloride 75 mL/hr at 05/08/15 1258  . Marland KitchenTPN (CLINIMIX-E) Adult     And  . fat emulsion      Principal Problem:   Enterocutaneous fistula Active Problems:   OSA (obstructive sleep apnea)   Essential hypertension   CAD S/P OM BMS 8/07, ISR-OM DES 8/09   GERD   Chronic kidney disease, stage 3   DVT, HX OF   Hyperlipidemia with target LDL less than 100   Anemia, iron deficiency   Type 2 diabetes mellitus without complication   Hyperkalemia   Acute idiopathic gout of left ankle    Time spent: 72 minutes    Aubriauna Riner M.D. [facility] Pager [phone]. If 7PM-7AM, please contact night-coverage at [email or web address], password [facility] 05/08/2015, 2:50 PM  LOS: 11 days

## 2015-05-08 NOTE — Progress Notes (Signed)
10 Days Post-Op  Subjective: She is fine, no real change.  Her Eakins pouch is leaking all over this AM  Objective: Vital signs in last 24 hours: Temp:  [97.7 F (36.5 C)-98 F (36.7 C)] 97.7 F (36.5 C) (08/26 2248) Pulse Rate:  [64-78] 64 (08/26 0613) Resp:  [17-18] 17 (08/26 0613) BP: (100-120)/(54-66) 120/56 mmHg (08/26 0613) SpO2:  [100 %] 100 % (08/26 2500) Last BM Date: 05/04/15  Intake/Output from previous day: 08/25 0701 - 08/26 0700 In: 626.4 [I.V.:626.4] Out: 1970 [Urine:1440; Drains:530] Intake/Output this shift:    General appearance: alert Resp: clear to auscultation bilaterally GI: soft, non-tender; bowel sounds normal; no masses,  no organomegaly and Eakin's leaking, will change and look at whole abdomen later when they have supplies to do dressing change.      Lab Results:   Recent Labs  05/07/15 0602  WBC 8.1  HGB 8.5*  HCT 27.9*  PLT 288    BMET  Recent Labs  05/06/15 0612 05/07/15 0602  NA 131* 131*  K 4.9 4.8  CL 102 101  CO2 21* 20*  GLUCOSE 119* 119*  BUN 70* 83*  CREATININE 1.59* 1.68*  CALCIUM 9.1 9.0   PT/INR No results for input(s): LABPROT, INR in the last 72 hours.   Recent Labs Lab 05/04/15 0554 05/07/15 0602  AST 24 63*  ALT 35 109*  ALKPHOS 99 85  BILITOT 0.4 0.2*  PROT 7.5 7.3  ALBUMIN 2.7* 2.6*     Lipase     Component Value Date/Time   LIPASE 27 12/26/2014 1815     Studies/Results: Ct Abdomen Pelvis Wo Contrast  05/06/2015   CLINICAL DATA:  Enterocutaneous fistula  EXAM: CT ABDOMEN AND PELVIS WITHOUT CONTRAST  TECHNIQUE: Multidetector CT imaging of the abdomen and pelvis was performed following the standard protocol without IV contrast.  COMPARISON:  04/14/2015  FINDINGS: LOWER CHEST: Extensive coronary atherosclerosis.  ABDOMEN/PELVIS:  Liver: No focal abnormality.  Biliary: Cholecystectomy.  Pancreas: Unremarkable.  Spleen: Unremarkable.  Adrenals: Unremarkable.  Kidneys and ureters: No  hydronephrosis or stone. Stable lobulation of the bilateral kidneys.  Bladder: Unremarkable.  Reproductive: Hysterectomy probable oophorectomies.  Bowel: Defect in the supraumbilical midline abdominal wall with internal folded drain is surrounded by enteric contrast from enterocutaneous fistula. On sagittal imaging, a small bowel fistulous tract is visible along the lower margin of the abdominal wall defect. The defect in the bowel wall is channel like. Multiple small bowel loops are matted underneath the diastatic midline abdominal wall. No evidence of bowel obstruction. There is colonic and proximal small bowel diverticulosis.  Retroperitoneum: No mass or adenopathy.  Peritoneum: No ascites or pneumoperitoneum.  Vascular: No acute abnormality.  OSSEOUS: Extensive spondylosis and facet arthropathy.  IMPRESSION: 1. Mid small bowel enterocutaneous fistula draining into the lower margin of the abdominal wall defect. 2. Extensive small bowel adhesions to the diastatic abdominal wall. 3. Colonic and small bowel diverticulosis.   Electronically Signed   By: Monte Fantasia M.D.   On: 05/06/2015 15:18    Medications: . amLODipine  2.5 mg Oral Daily  . Chlorhexidine Gluconate Cloth  6 each Topical Q0600  . enoxaparin (LOVENOX) injection  40 mg Subcutaneous Q24H  . feeding supplement  1 Container Oral TID BM  . insulin aspart  0-15 Units Subcutaneous 4 times per day  . iron polysaccharides  150 mg Oral Daily  . metoprolol tartrate  25 mg Oral q morning - 10a  . mupirocin ointment   Nasal BID  .  octreotide  100 mcg Subcutaneous 3 times per day  . pantoprazole (PROTONIX) IV  40 mg Intravenous QHS  . cyanocobalamin  2,000 mcg Oral Daily   . sodium chloride 75 mL/hr at 62/94/76 5465  Cyclic TNA  Assessment/Plan Enterocutaneous fistula with infected abdominal mesh S/p Abdominal wound exploration/ explantation of infected abdominal mesh, 04/28/15, Dr. Georgette Dover POD 9 S/p left hemicolectomy; Dr. Lucia Gaskins 2003,  Incarcerated hernia repair, Dr. Hassell Done 2009, hysterectomy with post op EC fistula, re-exploration of the wound and conservative management Dr. Georgette Dover 2013, healed in January 2014 follow up CKD stage III Hx of hypertension AODM Chronic anemia  Malnutrition on TNA Antibtiotics: None  Dx of DVT: Lovenox/SCD   Plan:  I will look at the site later, they are cleaning her up now.  Continue TNA, and NPO. Picture of fistula and current rigging for pouch above, she has a valley in her midline so keeping her dressing sealed is difficult.        LOS: 11 days    Bronte Sabado 05/08/2015

## 2015-05-09 LAB — BASIC METABOLIC PANEL
ANION GAP: 8 (ref 5–15)
BUN: 71 mg/dL — AB (ref 6–20)
CHLORIDE: 105 mmol/L (ref 101–111)
CO2: 18 mmol/L — AB (ref 22–32)
Calcium: 8.3 mg/dL — ABNORMAL LOW (ref 8.9–10.3)
Creatinine, Ser: 1.56 mg/dL — ABNORMAL HIGH (ref 0.44–1.00)
GFR calc Af Amer: 37 mL/min — ABNORMAL LOW (ref >60)
GFR, EST NON AFRICAN AMERICAN: 32 mL/min — AB (ref >60)
GLUCOSE: 130 mg/dL — AB (ref 65–99)
POTASSIUM: 4.5 mmol/L (ref 3.5–5.1)
Sodium: 131 mmol/L — ABNORMAL LOW (ref 135–145)

## 2015-05-09 LAB — GLUCOSE, CAPILLARY
GLUCOSE-CAPILLARY: 112 mg/dL — AB (ref 65–99)
GLUCOSE-CAPILLARY: 165 mg/dL — AB (ref 65–99)
GLUCOSE-CAPILLARY: 147 mg/dL — AB (ref 65–99)
Glucose-Capillary: 89 mg/dL (ref 65–99)

## 2015-05-09 MED ORDER — TRACE MINERALS CR-CU-MN-SE-ZN 10-1000-500-60 MCG/ML IV SOLN
INTRAVENOUS | Status: AC
  Administered 2015-05-09: 18:00:00 via INTRAVENOUS
  Filled 2015-05-09: qty 1800

## 2015-05-09 MED ORDER — SODIUM CHLORIDE 0.9 % IV SOLN
INTRAVENOUS | Status: AC
  Administered 2015-05-09 – 2015-05-11 (×4): via INTRAVENOUS

## 2015-05-09 MED ORDER — FAT EMULSION 20 % IV EMUL
240.0000 mL | INTRAVENOUS | Status: AC
  Administered 2015-05-09: 240 mL via INTRAVENOUS
  Filled 2015-05-09: qty 250

## 2015-05-09 MED ORDER — SODIUM BICARBONATE 650 MG PO TABS
650.0000 mg | ORAL_TABLET | Freq: Two times a day (BID) | ORAL | Status: DC
  Administered 2015-05-09 – 2015-05-21 (×25): 650 mg via ORAL
  Filled 2015-05-09 (×25): qty 1

## 2015-05-09 NOTE — Progress Notes (Signed)
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 The patient ultimately went for I&D and explantation of infected abdominal mesh on 04/28/2015. She also has PMH of CAD, s/p OM BMS in August 2007 with ISR treated with a DES April 2009, Cath Done in August 2009 showed patency of the stent, Myoview October 2015 was low risk and echo at that time showed EF 55-60 percent. Cardiology evaluated her during recent hospitalization, determined that her cardiac status was stable and cleared her for laparotomy on 03/20/15. She also has PMH of HTN, stage III chronic kidney disease, chronic diastolic CHF, gout, OSA, GERD, HLD, diet controlled DM, DVT-not on anticoagulation secondary to recurrent diverticular and gastric bleeding and iron deficiency anemia.  Assessment/Plan: #1 enterocutaneous fistula Status post abdominal wound exploration/explantation of infected abdominal mesh per Dr.Tsuei 04/28/2015. Patient is currently afebrile. Patient on TPN. Eakins pouch was leaking yesterday however seems to have resolved and stabilized with no further leaking. Per primary team.  #2 hypertension Blood pressure was initially borderline and as such BP meds were  held. Continue current regimen of low-dose Norvasc and metoprolol. Will hold off on resuming ARB in the setting of acute on chronic renal failure.  #3 acute on chronic kidney disease stage III Patient's baseline creatinine was 1.1-1.4. Renal function trending seems to be trending back down with hydration. Creatinine currently at 1.56. Continue gentle hydration for another 24 hours. Continue to hold ARB. Follow.    #4 acute on chronic diastolic CHF Stable. Compensated.   #5 obstructive sleep apnea CPAP daily at bedtime.  #6 gastroesophageal reflux disease PPI.  #7 well-controlled type 2 diabetes mellitus diet-controlled Hemoglobin A1c was 5.7 on 03/14/2015. CBGs have ranged from 89-128. Continue sliding scale insulin.  #8 history of DVT Not anticoagulation candidate secondary to recurrent diverticular gastric bleeding.  #9 iron deficiency anemia H&H stable. Continue iron supplementation.  #10 coronary artery disease Stable. Continue beta blocker.  #11 generalized weakness Likely secondary to deconditioning. UA negative for UTI. PT.  #12 left ankle pain/acute gouty flare Clinical improvement on prednisone. Continue prednisone taper.  #13 prophylaxis PPI for GI prophylaxis. Lovenox for DVT prophylaxis.  Code Status: Full Family Communication: Updated patient. No family at bedside. Disposition Plan: Per primary team.   Consultants:  [facility]:Dr Hongalgi 04/27/2015  Procedures:  Chest x-ray 04/27/2015  Abdominal wound exploration/explantation of infected abdominal mesh per Dr. Georgette Dover 04/28/2015  CT abdomen and pelvis 05/06/2015  X-ray left ankle 05/04/2015  Antibiotics:  None  HPI/Subjective: Patient with no shortness of breath. No chest pain. Leaking around [place] pouch resolved.  Objective: Filed Vitals:   05/09/15 1343  BP: 103/48  Pulse: 65  Temp: 97.5 F (36.4 C)  Resp: 16    Intake/Output Summary (Last 24 hours) at 05/09/15 1444 Last  data filed at 05/09/15 0902  Gross per 24 hour  Intake 2428.92 ml  Output   2650 ml  Net -221.08  ml   Filed Weights   05/01/15 0805  Weight: 99.3 kg (218 lb 14.7 oz)    Exam:   General:  NAD  Cardiovascular: RRR  Respiratory: CTAB anterior lung fields  Abdomen: Soft, + bowel sounds, some tenderness to palpation.Eakin pouch intact.  Musculoskeletal: No clubbing cyanosis or edema.  Data Reviewed: Basic Metabolic Panel:  Recent Labs Lab 05/03/15 0630 05/04/15 0554 05/05/15 0557 05/06/15 0612 05/07/15 0602 05/08/15 0846 05/09/15 0556  NA 133* 131* 129* 131* 131* 130* 131*  K 4.6 5.6* 4.7 4.9 4.8 4.6 4.5  CL 103 103 102 102 101 103 105  CO2 24 20* 21* 21* 20* 20* 18*  GLUCOSE 114* 149* 191* 119* 119* 151* 130*  BUN 41* 46* 52* 70* 83* 77* 71*  CREATININE 1.34* 1.40* 1.62* 1.59* 1.68* 1.68* 1.56*  CALCIUM 8.8* 9.4 9.0 9.1 9.0 8.4* 8.3*  MG 2.0 2.0  --   --  2.3  --   --   PHOS  --  3.5  --   --  4.7*  --   --    Liver Function Tests:  Recent Labs Lab 05/04/15 0554 05/07/15 0602  AST 24 63*  ALT 35 109*  ALKPHOS 99 85  BILITOT 0.4 0.2*  PROT 7.5 7.3  ALBUMIN 2.7* 2.6*   No results for input(s): LIPASE, AMYLASE in the last 168 hours. No results for input(s): AMMONIA in the last 168 hours. CBC:  Recent Labs Lab 05/04/15 0554 05/07/15 0602 05/08/15 0846  WBC 8.5 8.1 8.4  NEUTROABS 7.3  --   --   HGB 8.0* 8.5* 8.3*  HCT 25.9* 27.9* 27.1*  MCV 72.8* 71.5* 72.5*  PLT 229 288 252   Cardiac Enzymes: No results for input(s): CKTOTAL, CKMB, CKMBINDEX, TROPONINI in the last 168 hours. BNP (last 3 results)  Recent Labs  11/02/14 2340 03/19/15 1325  BNP 109.2* 141.6*    ProBNP (last 3 results) No results for input(s): PROBNP in the last 8760 hours.  CBG:  Recent Labs Lab 05/08/15 1158 05/08/15 2009 05/08/15 2253 05/09/15 0640 05/09/15 1142  GLUCAP 106* 153* 128* 89 112*    Recent Results (from the past 240 hour(s))  Urine culture      Status: None   Collection Time: 04/30/15  9:20 PM  Result Value Ref Range Status   Specimen Description URINE, RANDOM  Final   Special Requests NONE  Final   Culture MULTIPLE SPECIES PRESENT, SUGGEST RECOLLECTION  Final   Report Status 05/02/2015 FINAL  Final     Studies: No results found.  Scheduled Meds: . amLODipine  2.5 mg Oral Daily  . Chlorhexidine Gluconate Cloth  6 each Topical Q0600  . enoxaparin (LOVENOX) injection  40 mg Subcutaneous Q24H  . insulin aspart  0-15 Units Subcutaneous 4 times per day  . iron polysaccharides  150 mg Oral Daily  . metoprolol tartrate  25 mg Oral q morning - 10a  . mupirocin ointment   Nasal BID  . octreotide  100 mcg Subcutaneous 3 times per day  . pantoprazole (PROTONIX) IV  40 mg Intravenous QHS  . sodium bicarbonate  650 mg Oral BID  . cyanocobalamin  2,000 mcg Oral Daily   Continuous Infusions: . sodium chloride 75 mL/hr at 05/09/15 1400  . Marland KitchenTPN (CLINIMIX-E) Adult     And  . fat emulsion      Principal Problem:   Enterocutaneous fistula Active Problems:   OSA (obstructive sleep apnea)   Essential hypertension   CAD  S/P OM BMS 8/07, ISR-OM DES 8/09   GERD   Chronic kidney disease, stage 3   DVT, HX OF   Hyperlipidemia with target LDL less than 100   Anemia, iron deficiency   Type 2 diabetes mellitus without complication   Hyperkalemia   Acute idiopathic gout of left ankle    Time spent: 81 minutes    Demba Nigh M.D. [facility] Pager [phone]. If 7PM-7AM, please contact night-coverage at [email or web address], password [facility] 05/09/2015, 2:44 PM  LOS: 12 days

## 2015-05-09 NOTE — Progress Notes (Addendum)
11 Days Post-Op  Subjective: Stable.  Sleeps a lot but easily arousable and appropriate. Drainage from [place] pouch is much better controlled this morning.  Reportedly 200 mL out last 24 hours.   No stools. Creatinine 1.56, stable.  Potassium 5.2.  Glucose 1:30.  Objective: Vital signs in last 24 hours: Temp:  [87.2 F (30.7 C)-98.3 F (36.8 C)] 98.1 F (36.7 C) (08/27 0655) Pulse Rate:  [65-78] 78 (08/27 0655) Resp:  [17-18] 18 (08/27 0655) BP: (110-120)/(49-58) 110/49 mmHg (08/27 0655) SpO2:  [100 %] 100 % (08/27 0655) Last BM Date: 05/04/15  Intake/Output from previous day: 08/26 0701 - 08/27 0700 In: 2428.9 [I.V.:1515; TPN:913.9] Out: 2700 [Urine:2500; Drains:200] Intake/Output this shift: Total I/O In: 0  Out: 650 [Urine:650]  General appearance: Alert.  Cooperative.  Middle status normal.  No distress. GI: Abdomen obese but soft, nontender, nondistended.  Bowel sounds present.  Eakins pouch and red rubber catheter present.  Pouch is secure.  Low volume output.  Lab Results:  Results for orders placed or performed during the hospital encounter of 04/27/15 (from the past 24 hour(s))  Glucose, capillary     Status: Abnormal   Collection Time: 05/08/15 11:58 AM  Result Value Ref Range   Glucose-Capillary 106 (H) 65 - 99 mg/dL   Comment 1 Repeat Test   Glucose, capillary     Status: Abnormal   Collection Time: 05/08/15  8:09 PM  Result Value Ref Range   Glucose-Capillary 153 (H) 65 - 99 mg/dL   Comment 1 Notify RN   Glucose, capillary     Status: Abnormal   Collection Time: 05/08/15 10:53 PM  Result Value Ref Range   Glucose-Capillary 128 (H) 65 - 99 mg/dL   Comment 1 Notify RN   Basic metabolic panel     Status: Abnormal   Collection Time: 05/09/15  5:56 AM  Result Value Ref Range   Sodium 131 (L) 135 - 145 mmol/L   Potassium 4.5 3.5 - 5.1 mmol/L   Chloride 105 101 - 111 mmol/L   CO2 18 (L) 22 - 32 mmol/L   Glucose, Bld 130 (H) 65 - 99 mg/dL   BUN 71 (H) 6 -  20 mg/dL   Creatinine, Ser 1.56 (H) 0.44 - 1.00 mg/dL   Calcium 8.3 (L) 8.9 - 10.3 mg/dL   GFR calc non Af Amer 32 (L) >60 mL/min   GFR calc Af Amer 37 (L) >60 mL/min   Anion gap 8 5 - 15  Glucose, capillary     Status: None   Collection Time: 05/09/15  6:40 AM  Result Value Ref Range   Glucose-Capillary 89 65 - 99 mg/dL   Comment 1 Notify RN      Studies/Results: No results found.  Marland Kitchen amLODipine  2.5 mg Oral Daily  . Chlorhexidine Gluconate Cloth  6 each Topical Q0600  . enoxaparin (LOVENOX) injection  40 mg Subcutaneous Q24H  . insulin aspart  0-15 Units Subcutaneous 4 times per day  . iron polysaccharides  150 mg Oral Daily  . metoprolol tartrate  25 mg Oral q morning - 10a  . mupirocin ointment   Nasal BID  . octreotide  100 mcg Subcutaneous 3 times per day  . pantoprazole (PROTONIX) IV  40 mg Intravenous QHS  . sodium bicarbonate  650 mg Oral BID  . cyanocobalamin  2,000 mcg Oral Daily     Assessment/Plan: s/p Procedure(s): Abdominal Wound Explororation, Explantation of Abdominal Mesh   Enterocutaneous fistula with infected abdominal  mesh S/p Abdominal wound exploration/ explantation of infected abdominal mesh, 04/28/15, Dr. Georgette Dover POD 10 S/p left hemicolectomy; Dr. Lucia Gaskins 2003, Incarcerated hernia repair, Dr. Hassell Done 2009, hysterectomy with post op EC fistula, re-exploration of the wound and conservative management Dr. Georgette Dover 2013, healed in January 2014 follow up CKD stage III Hx of hypertension AODM Chronic anemia  Malnutrition on TNA Antibtiotics: None  Dx of DVT: Lovenox/SCD  Plan:. Continue TNA, and NPO.              Continue to control drainage in hopes that fistula will spontaneously close.   @PROBHOSP @  LOS: 12 days    Marissa [name] 05/09/2015  . .prob

## 2015-05-09 NOTE — Progress Notes (Signed)
PARENTERAL NUTRITION CONSULT NOTE - FOLLOW UP  Pharmacy Consult for TPN Indication: EC fistula  No Known Allergies  Patient Measurements: Height: 5\' 4"  (162.6 cm) Weight: 218 lb 14.7 oz (99.3 kg) IBW/kg (Calculated) : 54.7  Weight 99.3 kg  Vital Signs: Temp: 98.1 F (36.7 C) (08/27 0655) Temp Source: Oral (08/27 0655) BP: 110/49 mmHg (08/27 0655) Pulse Rate: 78 (08/27 0655) Intake/Output from previous day: 08/26 0701 - 08/27 0700 In: 2428.9 [I.V.:1515; TPN:913.9] Out: 2700 [Urine:2500; Drains:200] Intake/Output from this shift:   Labs:  Recent Labs  05/07/15 0602 05/08/15 0846  WBC 8.1 8.4  HGB 8.5* 8.3*  HCT 27.9* 27.1*  PLT 288 252    Recent Labs  05/07/15 0602 05/08/15 0846 05/09/15 0556  NA 131* 130* 131*  K 4.8 4.6 4.5  CL 101 103 105  CO2 20* 20* 18*  GLUCOSE 119* 151* 130*  BUN 83* 77* 71*  CREATININE 1.68* 1.68* 1.56*  CALCIUM 9.0 8.4* 8.3*  MG 2.3  --   --   PHOS 4.7*  --   --   PROT 7.3  --   --   ALBUMIN 2.6*  --   --   AST 63*  --   --   ALT 109*  --   --   ALKPHOS 85  --   --   BILITOT 0.2*  --   --    Estimated Creatinine Clearance: 36.2 mL/min (by C-G formula based on Cr of 1.56).   Recent Labs  05/08/15 2009 05/08/15 2253 05/09/15 0640  GLUCAP 153* 128* 89   Medications:  Infusions:    Insulin Requirements in the past 24 hours:  5 units of SSI  Nutritional Goals: per RD on 8/26 Protein: 100-110 g  Kcal: 1700-1900  Fluid: 1.7-1.9 L  Current Nutrition:  Chronic TPN per [place] kcal, 85g protein - my appreciation to Carolynn Sayers for her documentation of home TPN.  Cyclic Clinimix E 1/49: 63ml/hr x 1 hour, 165 ml/hr x 10 hours, 50 ml/hr x 1 hour. 20% IVFE at 20 ml/hr x 12 hours. This will provide 1672 kcal and 84g protein. Boost TID ordered (NPO)  NPO  Assessment:  Surgeries/Procedures: s/p abdominal wound exploration with removal of infected abdominal mesh 8/16  GI: EC fistula, abdominal wound.  Drain in place with 238ml output yesterday which increased from the day before. Sandostatin added on 8/22 to help with closure and increased on 8/24. If unable to help close, may need to address surgical interventions to close fistula. No N/V or abdominal pain. Does have some flatus and last BM was 8/22.  Endo: CBGs well controlled requiring minimal SSI Lytes: Lytes wnl exc Na 131 Renal: SCr elevated at 1.56, UOP 1 ml/kg/hr Pulm: 100% RA Cards: BP 110/49, HR 78 - amlodipine, metoprolol Hepatobil: LFTs slightly elevated. TG 45, Tbili 0.2. Prealbumin 21. Albumin low at 2.6, octreotide Neuro: A&O ID: Afebrile, WBC WNL, no abx Best Practices: lovenox, PPI IV  TPN Access: PICC TPN start date: chronic  Plan:  - Continue cyclic Clinimix E 7/02 - Total volume of 1800 mL over 12 hours. 64ml/hr x 1 hour, 165 ml/hr x 10 hours, 52ml/hr x 1 hour.  - Continue cyclic 63% IVFE at 20 ml/hr x 12 hours. - TPN + IVFE will provide 1758 kcal (100% of needs) and 90 g of protein (90% of needs)  - Continue MVI and trace elements in TPN - Continue moderate SSI  Salome Arnt, PharmD, BCPS Pager #  [phone] 05/09/2015 8:05 AM

## 2015-05-09 NOTE — Progress Notes (Signed)
Pt. Refused cpap. 

## 2015-05-10 DIAGNOSIS — E871 Hypo-osmolality and hyponatremia: Secondary | ICD-10-CM | POA: Clinically undetermined

## 2015-05-10 LAB — GLUCOSE, CAPILLARY
GLUCOSE-CAPILLARY: 105 mg/dL — AB (ref 65–99)
GLUCOSE-CAPILLARY: 120 mg/dL — AB (ref 65–99)
GLUCOSE-CAPILLARY: 130 mg/dL — AB (ref 65–99)
GLUCOSE-CAPILLARY: 164 mg/dL — AB (ref 65–99)

## 2015-05-10 LAB — BASIC METABOLIC PANEL
ANION GAP: 7 (ref 5–15)
BUN: 59 mg/dL — AB (ref 6–20)
CHLORIDE: 108 mmol/L (ref 101–111)
CO2: 19 mmol/L — ABNORMAL LOW (ref 22–32)
Calcium: 8.5 mg/dL — ABNORMAL LOW (ref 8.9–10.3)
Creatinine, Ser: 1.43 mg/dL — ABNORMAL HIGH (ref 0.44–1.00)
GFR calc Af Amer: 41 mL/min — ABNORMAL LOW (ref >60)
GFR calc non Af Amer: 35 mL/min — ABNORMAL LOW (ref >60)
GLUCOSE: 130 mg/dL — AB (ref 65–99)
POTASSIUM: 4.4 mmol/L (ref 3.5–5.1)
Sodium: 134 mmol/L — ABNORMAL LOW (ref 135–145)

## 2015-05-10 MED ORDER — FAT EMULSION 20 % IV EMUL
240.0000 mL | INTRAVENOUS | Status: AC
  Administered 2015-05-10: 240 mL via INTRAVENOUS
  Filled 2015-05-10: qty 250

## 2015-05-10 MED ORDER — TRACE MINERALS CR-CU-MN-SE-ZN 10-1000-500-60 MCG/ML IV SOLN
INTRAVENOUS | Status: AC
  Administered 2015-05-10: 18:00:00 via INTRAVENOUS
  Filled 2015-05-10: qty 1800

## 2015-05-10 MED ORDER — METOPROLOL TARTRATE 12.5 MG HALF TABLET
12.5000 mg | ORAL_TABLET | Freq: Every morning | ORAL | Status: DC
  Administered 2015-05-11 – 2015-05-21 (×11): 12.5 mg via ORAL
  Filled 2015-05-10 (×11): qty 1

## 2015-05-10 NOTE — Progress Notes (Signed)
Eakins pouch leaking earlier today.  Took off and thoroughly cleansed skin, got old adhesive off and prepped skin and let dry.  Instead of bringing the red robinson catheter out through a hole in the [place] bag, we threaded it up through the bottom of the Eakins bag and cut a suction tubing end to adapt it and make it fit.  We placed an NG stopcock to connect it to the suction tubing going to the cannister.  At the base of the [facility], made a build up of 2 rings to fill in and support the Eakins pouch from being able to leak.  Seal was good and I think may hold a little longer.

## 2015-05-10 NOTE — Progress Notes (Signed)
PARENTERAL NUTRITION CONSULT NOTE - FOLLOW UP  Pharmacy Consult for TPN Indication: EC fistula  No Known Allergies  Patient Measurements: Height: 5\' 4"  (162.6 cm) Weight: 218 lb 14.7 oz (99.3 kg) IBW/kg (Calculated) : 54.7  Weight 99.3 kg  Vital Signs: Temp: 97.8 F (36.6 C) (08/28 0640) Temp Source: Oral (08/28 0640) BP: 112/53 mmHg (08/28 0640) Pulse Rate: 79 (08/28 0640) Intake/Output from previous day: 08/27 0701 - 08/28 0700 In: 0  Out: 2350 [Urine:2350] Intake/Output from this shift:   Labs:  Recent Labs  05/08/15 0846  WBC 8.4  HGB 8.3*  HCT 27.1*  PLT 252    Recent Labs  05/08/15 0846 05/09/15 0556 05/10/15 0515  NA 130* 131* 134*  K 4.6 4.5 4.4  CL 103 105 108  CO2 20* 18* 19*  GLUCOSE 151* 130* 130*  BUN 77* 71* 59*  CREATININE 1.68* 1.56* 1.43*  CALCIUM 8.4* 8.3* 8.5*   Estimated Creatinine Clearance: 39.5 mL/min (by C-G formula based on Cr of 1.43).   Recent Labs  05/09/15 2005 05/09/15 2318 05/10/15 0632  GLUCAP 165* 147* 120*   Medications:  Infusions:  . sodium chloride 75 mL/hr at 05/10/15 0452   Insulin Requirements in the past 24 hours:  5 units of SSI  Nutritional Goals: per RD on 8/26 Protein: 100-110 g  Kcal: 1700-1900  Fluid: 1.7-1.9 L  Current Nutrition:  Chronic TPN per [place] kcal, 85g protein - my appreciation to Carolynn Sayers for her documentation of home TPN.  Cyclic Clinimix E 6/94: 20ml/hr x 1 hour, 165 ml/hr x 10 hours, 50 ml/hr x 1 hour. 20% IVFE at 20 ml/hr x 12 hours. This will provide 1672 kcal and 84g protein. Boost TID ordered (NPO)  NPO  Assessment:  Surgeries/Procedures: s/p abdominal wound exploration with removal of infected abdominal mesh 8/16  GI: EC fistula, abdominal wound. Drain in place with 247ml output yesterday which increased from the day before. Sandostatin added on 8/22 to help with closure and increased on 8/24. If unable to help close, may need to address  surgical interventions to close fistula. No N/V or abdominal pain. Does have some flatus and last BM was 8/22.  Endo: CBGs well controlled requiring minimal SSI Lytes: Lytes wnl exc Na 131 Renal: SCr elevated at 1.43, UOP 1 ml/kg/hr Pulm: 100% RA Cards: BP 112/53, HR 79 - amlodipine, metoprolol Hepatobil: LFTs slightly elevated. TG 45, Tbili 0.2. Prealbumin 21. Albumin low at 2.6, octreotide Neuro: A&O ID: Afebrile, WBC WNL, no abx Best Practices: lovenox, PPI IV  TPN Access: PICC TPN start date: chronic  Plan:  - Continue cyclic Clinimix E 5/03 - Total volume of 1800 mL over 12 hours. 29ml/hr x 1 hour, 165 ml/hr x 10 hours, 107ml/hr x 1 hour.  - Continue cyclic 88% IVFE at 20 ml/hr x 12 hours. - TPN + IVFE will provide 1758 kcal (100% of needs) and 90 g of protein (90% of needs)  - Continue MVI and trace elements in TPN - Continue moderate SSI - F/u labs and surgery plans  Salome Arnt, PharmD, BCPS Pager # [phone] 05/10/2015 7:42 AM

## 2015-05-10 NOTE — Progress Notes (Signed)
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 The patient ultimately went for I&D and explantation of infected abdominal mesh on 04/28/2015. She also has PMH of CAD, s/p OM BMS in August 2007 with ISR treated with a DES April 2009, Cath Done in August 2009 showed patency of the stent, Myoview October 2015 was low risk and echo at that time showed EF 55-60 percent. Cardiology evaluated her during recent hospitalization, determined that her cardiac status was stable and cleared her for laparotomy on 03/20/15. She also has PMH of HTN, stage III chronic kidney disease, chronic diastolic CHF, gout, OSA, GERD, HLD, diet controlled DM, DVT-not on anticoagulation secondary to recurrent diverticular and gastric bleeding and iron deficiency anemia.  Assessment/Plan: #1 enterocutaneous fistula Status post abdominal wound exploration/explantation of infected abdominal mesh per Dr.Tsuei 04/28/2015. Patient is currently afebrile. Patient on TPN. Eakins pouch was leaking 2 days ago, however seems to have resolved and stabilized with no further leaking. Per primary team.  #2 hypertension Blood pressure was initially borderline and as such BP meds  were held. BP trending down. D/C norvasc. Continue low-dose metoprolol. Will hold off on resuming ARB in the setting of acute on chronic renal failure.  #3 acute on chronic kidney disease stage III Patient's baseline creatinine was 1.1-1.4. Renal function trending seems to be trending back down with hydration. Creatinine currently at 1.43. Continue gentle hydration for another 24 hours. Continue to hold ARB. Follow.    #4 acute on chronic diastolic CHF Stable. Compensated.   #5 obstructive sleep apnea CPAP daily at bedtime.  #6 gastroesophageal reflux disease PPI.  #7 well-controlled type 2 diabetes mellitus diet-controlled Hemoglobin A1c was 5.7 on 03/14/2015. CBGs have ranged from 120-130. Continue sliding scale insulin.  #8 history of DVT Not anticoagulation candidate secondary to recurrent diverticular gastric bleeding.  #9 iron deficiency anemia H&H stable. Continue iron supplementation.  #10 coronary artery disease Stable. Continue beta blocker.  #11 generalized weakness Likely secondary to deconditioning. UA negative for UTI. PT.  #12 left ankle pain/acute gouty flare Clinical improvement on prednisone. S/P prednisone taper. Outpatient f/u.  #13 prophylaxis PPI for GI prophylaxis. Lovenox for DVT prophylaxis.  Code Status: Full Family Communication: Updated patient. No family at bedside. Disposition Plan: Per primary team.   Consultants:  [facility]: Dr Algis Liming 04/27/2015  Procedures:  Chest x-ray 04/27/2015  Abdominal wound exploration/explantation of infected abdominal mesh per Dr. Georgette Dover 04/28/2015  CT abdomen and pelvis 05/06/2015  X-ray left ankle 05/04/2015  Antibiotics:  None  HPI/Subjective: Patient with no shortness of breath. No chest pain. Leaking around [place] pouch resolved.  Objective: Filed Vitals:   05/10/15 0640  BP: 112/53  Pulse: 79  Temp: 97.8 F (36.6 C)  Resp: 17    Intake/Output Summary (Last 24 hours) at  05/10/15 1240 Last data filed at 05/10/15 1115  Gross per 24 hour  Intake      0  ml  Output   3300 ml  Net  -3300 ml   Filed Weights   05/01/15 0805  Weight: 99.3 kg (218 lb 14.7 oz)    Exam:   General:  NAD  Cardiovascular: RRR  Respiratory: CTAB anterior lung fields  Abdomen: Soft, + bowel sounds, some tenderness to palpation.Eakin pouch intact.  Musculoskeletal: No clubbing cyanosis or edema.  Data Reviewed: Basic Metabolic Panel:  Recent Labs Lab 05/04/15 0554  05/06/15 0612 05/07/15 0602 05/08/15 0846 05/09/15 0556 05/10/15 0515  NA 131*  < > 131* 131* 130* 131* 134*  K 5.6*  < > 4.9 4.8 4.6 4.5 4.4  CL 103  < > 102 101 103 105 108  CO2 20*  < > 21* 20* 20* 18* 19*  GLUCOSE 149*  < > 119* 119* 151* 130* 130*  BUN 46*  < > 70* 83* 77* 71* 59*  CREATININE 1.40*  < > 1.59* 1.68* 1.68* 1.56* 1.43*  CALCIUM 9.4  < > 9.1 9.0 8.4* 8.3* 8.5*  MG 2.0  --   --  2.3  --   --   --   PHOS 3.5  --   --  4.7*  --   --   --   < > = values in this interval not displayed. Liver Function Tests:  Recent Labs Lab 05/04/15 0554 05/07/15 0602  AST 24 63*  ALT 35 109*  ALKPHOS 99 85  BILITOT 0.4 0.2*  PROT 7.5 7.3  ALBUMIN 2.7* 2.6*   No results for input(s): LIPASE, AMYLASE in the last 168 hours. No results for input(s): AMMONIA in the last 168 hours. CBC:  Recent Labs Lab 05/04/15 0554 05/07/15 0602 05/08/15 0846  WBC 8.5 8.1 8.4  NEUTROABS 7.3  --   --   HGB 8.0* 8.5* 8.3*  HCT 25.9* 27.9* 27.1*  MCV 72.8* 71.5* 72.5*  PLT 229 288 252   Cardiac Enzymes: No results for input(s): CKTOTAL, CKMB, CKMBINDEX, TROPONINI in the last 168 hours. BNP (last 3 results)  Recent Labs  11/02/14 2340 03/19/15 1325  BNP 109.2* 141.6*    ProBNP (last 3 results) No results for input(s): PROBNP in the last 8760 hours.  CBG:  Recent Labs Lab 05/09/15 1142 05/09/15 2005 05/09/15 2318 05/10/15 0632 05/10/15 1148  GLUCAP 112* 165* 147* 120* 130*    Recent  Results (from the past 240 hour(s))  Urine culture     Status: None   Collection Time: 04/30/15  9:20 PM  Result Value Ref Range Status   Specimen Description URINE, RANDOM  Final   Special Requests NONE  Final   Culture MULTIPLE SPECIES PRESENT, SUGGEST RECOLLECTION  Final   Report Status 05/02/2015 FINAL  Final     Studies: No results found.  Scheduled Meds: . amLODipine  2.5 mg Oral Daily  . Chlorhexidine Gluconate Cloth  6 each Topical Q0600  . enoxaparin (LOVENOX) injection  40 mg Subcutaneous Q24H  . insulin aspart  0-15 Units Subcutaneous 4 times per day  . iron polysaccharides  150 mg Oral Daily  . metoprolol tartrate  25 mg Oral q morning - 10a  . mupirocin ointment   Nasal BID  . octreotide  100 mcg Subcutaneous 3 times per day  . pantoprazole (PROTONIX) IV  40 mg Intravenous QHS  . sodium bicarbonate  650 mg Oral BID  . cyanocobalamin  2,000 mcg Oral Daily   Continuous Infusions: . sodium chloride 75 mL/hr at 05/10/15 0452  . Marland KitchenTPN (CLINIMIX-E)  Adult     And  . fat emulsion      Principal Problem:   Enterocutaneous fistula Active Problems:   OSA (obstructive sleep apnea)   Essential hypertension   CAD S/P OM BMS 8/07, ISR-OM DES 8/09   GERD   Chronic kidney disease, stage 3   DVT, HX OF   Hyperlipidemia with target LDL less than 100   Anemia, iron deficiency   Type 2 diabetes mellitus without complication   Hyperkalemia   Acute idiopathic gout of left ankle   Hyponatremia    Time spent: 105 minutes    THOMPSON,DANIEL M.D. [facility] Pager [phone]. If 7PM-7AM, please contact night-coverage at [email or web address], password [facility] 05/10/2015, 12:40 PM  LOS: 13 days

## 2015-05-10 NOTE — Progress Notes (Signed)
12 Days Post-Op  Subjective: Patient is alert and currently has no complaints.  No change in condition Getting her it is perhaps changed at this time.  Wound examined.  Healthy granulation tissue.  Enteric fluid at base of upper end  On cyclic TNA. Creatinine 1.43, coming down a little.  Potassium 4.4.  Sodium 134.  Glucose 1:30.  Objective: Vital signs in last 24 hours: Temp:  [97.5 F (36.4 C)-97.8 F (36.6 C)] 97.8 F (36.6 C) (08/28 0640) Pulse Rate:  [65-79] 79 (08/28 0640) Resp:  [16-18] 17 (08/28 0640) BP: (103-137)/(48-53) 112/53 mmHg (08/28 0640) SpO2:  [100 %] 100 % (08/28 0640) Last BM Date: 05/04/15  Intake/Output from previous day: 08/27 0701 - 08/28 0700 In: 0  Out: 2350 [Urine:2350] Intake/Output this shift: Total I/O In: 0  Out: 800 [Drains:800]    General appearance: Alert. Cooperative. Middle status normal. No distress. GI: Abdomen obese but soft, nontender, nondistended. Bowel sounds present. Eakins pouch p off.  Wound examined.  Vertical area of healthy granulation tissue.  At upper end of incision there is a recess with some enteric drainage.  Output 800 mL per 24 hours.   Lab Results:  Results for orders placed or performed during the hospital encounter of 04/27/15 (from the past 24 hour(s))  Glucose, capillary     Status: Abnormal   Collection Time: 05/09/15 11:42 AM  Result Value Ref Range   Glucose-Capillary 112 (H) 65 - 99 mg/dL   Comment 1 Notify RN   Glucose, capillary     Status: Abnormal   Collection Time: 05/09/15  8:05 PM  Result Value Ref Range   Glucose-Capillary 165 (H) 65 - 99 mg/dL   Comment 1 Notify RN   Glucose, capillary     Status: Abnormal   Collection Time: 05/09/15 11:18 PM  Result Value Ref Range   Glucose-Capillary 147 (H) 65 - 99 mg/dL   Comment 1 Notify RN   Basic metabolic panel     Status: Abnormal   Collection Time: 05/10/15  5:15 AM  Result Value Ref Range   Sodium 134 (L) 135 - 145 mmol/L   Potassium 4.4  3.5 - 5.1 mmol/L   Chloride 108 101 - 111 mmol/L   CO2 19 (L) 22 - 32 mmol/L   Glucose, Bld 130 (H) 65 - 99 mg/dL   BUN 59 (H) 6 - 20 mg/dL   Creatinine, Ser 1.43 (H) 0.44 - 1.00 mg/dL   Calcium 8.5 (L) 8.9 - 10.3 mg/dL   GFR calc non Af Amer 35 (L) >60 mL/min   GFR calc Af Amer 41 (L) >60 mL/min   Anion gap 7 5 - 15  Glucose, capillary     Status: Abnormal   Collection Time: 05/10/15  6:32 AM  Result Value Ref Range   Glucose-Capillary 120 (H) 65 - 99 mg/dL   Comment 1 Notify RN      Studies/Results: No results found.  Marland Kitchen amLODipine  2.5 mg Oral Daily  . Chlorhexidine Gluconate Cloth  6 each Topical Q0600  . enoxaparin (LOVENOX) injection  40 mg Subcutaneous Q24H  . insulin aspart  0-15 Units Subcutaneous 4 times per day  . iron polysaccharides  150 mg Oral Daily  . metoprolol tartrate  25 mg Oral q morning - 10a  . mupirocin ointment   Nasal BID  . octreotide  100 mcg Subcutaneous 3 times per day  . pantoprazole (PROTONIX) IV  40 mg Intravenous QHS  . sodium bicarbonate  650  mg Oral BID  . cyanocobalamin  2,000 mcg Oral Daily     Assessment/Plan: s/p Procedure(s): Abdominal Wound Explororation, Explantation of Abdominal Mesh   Enterocutaneous fistula with infected abdominal mesh POD 11 - S/p Abdominal wound exploration/ explantation of infected abdominal mesh, 04/28/15, Dr. Georgette Dover S/p left hemicolectomy; Dr. Lucia Gaskins 2003, Incarcerated hernia repair, Dr. Hassell Done 2009, hysterectomy with post op EC fistula, re-exploration of the wound and conservative management Dr. Georgette Dover 2013, healed in January 2014 follow up CKD stage III Hx of hypertension AODM Chronic anemia  Malnutrition on TNA Antibtiotics: None  Dx of DVT: Lovenox/SCD  Plan:. Continue  Cyclic TNA, and NPO.  Continue to control drainage in hopes that fistula will spontaneously close.  @PROBHOSP @  LOS: 13 days    Drue Camera M 05/10/2015  . .prob

## 2015-05-11 LAB — DIFFERENTIAL
Basophils Absolute: 0 10*3/uL (ref 0.0–0.1)
Basophils Relative: 0 % (ref 0–1)
EOS PCT: 2 % (ref 0–5)
Eosinophils Absolute: 0.2 10*3/uL (ref 0.0–0.7)
LYMPHS ABS: 1.2 10*3/uL (ref 0.7–4.0)
LYMPHS PCT: 15 % (ref 12–46)
MONO ABS: 0.7 10*3/uL (ref 0.1–1.0)
MONOS PCT: 9 % (ref 3–12)
NEUTROS ABS: 5.9 10*3/uL (ref 1.7–7.7)
Neutrophils Relative %: 74 % (ref 43–77)

## 2015-05-11 LAB — COMPREHENSIVE METABOLIC PANEL
ALBUMIN: 2.2 g/dL — AB (ref 3.5–5.0)
ALT: 47 U/L (ref 14–54)
AST: 23 U/L (ref 15–41)
Alkaline Phosphatase: 77 U/L (ref 38–126)
Anion gap: 4 — ABNORMAL LOW (ref 5–15)
BILIRUBIN TOTAL: 0.3 mg/dL (ref 0.3–1.2)
BUN: 48 mg/dL — AB (ref 6–20)
CHLORIDE: 111 mmol/L (ref 101–111)
CO2: 20 mmol/L — AB (ref 22–32)
Calcium: 8.5 mg/dL — ABNORMAL LOW (ref 8.9–10.3)
Creatinine, Ser: 1.31 mg/dL — ABNORMAL HIGH (ref 0.44–1.00)
GFR calc Af Amer: 45 mL/min — ABNORMAL LOW (ref >60)
GFR calc non Af Amer: 39 mL/min — ABNORMAL LOW (ref >60)
GLUCOSE: 109 mg/dL — AB (ref 65–99)
POTASSIUM: 4.5 mmol/L (ref 3.5–5.1)
SODIUM: 135 mmol/L (ref 135–145)
TOTAL PROTEIN: 5.4 g/dL — AB (ref 6.5–8.1)

## 2015-05-11 LAB — GLUCOSE, CAPILLARY
GLUCOSE-CAPILLARY: 136 mg/dL — AB (ref 65–99)
GLUCOSE-CAPILLARY: 176 mg/dL — AB (ref 65–99)
Glucose-Capillary: 87 mg/dL (ref 65–99)
Glucose-Capillary: 164 mg/dL — ABNORMAL HIGH (ref 65–99)

## 2015-05-11 LAB — MAGNESIUM: Magnesium: 1.9 mg/dL (ref 1.7–2.4)

## 2015-05-11 LAB — CBC
HEMATOCRIT: 24.5 % — AB (ref 36.0–46.0)
HEMOGLOBIN: 7.8 g/dL — AB (ref 12.0–15.0)
MCH: 22.7 pg — AB (ref 26.0–34.0)
MCHC: 31.8 g/dL (ref 30.0–36.0)
MCV: 71.2 fL — AB (ref 78.0–100.0)
Platelets: 182 10*3/uL (ref 150–400)
RBC: 3.44 MIL/uL — AB (ref 3.87–5.11)
RDW: 16 % — ABNORMAL HIGH (ref 11.5–15.5)
WBC: 8 10*3/uL (ref 4.0–10.5)

## 2015-05-11 LAB — PREALBUMIN: Prealbumin: 26.5 mg/dL (ref 18–38)

## 2015-05-11 LAB — TRIGLYCERIDES: Triglycerides: 107 mg/dL (ref <150)

## 2015-05-11 LAB — PHOSPHORUS: Phosphorus: 4.1 mg/dL (ref 2.5–4.6)

## 2015-05-11 MED ORDER — FAT EMULSION 20 % IV EMUL
240.0000 mL | INTRAVENOUS | Status: AC
Start: 2015-05-11 — End: 2015-05-12
  Administered 2015-05-11: 240 mL via INTRAVENOUS
  Filled 2015-05-11: qty 250

## 2015-05-11 MED ORDER — CLINIMIX E/DEXTROSE (5/15) 5 % IV SOLN
INTRAVENOUS | Status: AC
Start: 2015-05-11 — End: 2015-05-12
  Administered 2015-05-11: 18:00:00 via INTRAVENOUS
  Filled 2015-05-11: qty 1800

## 2015-05-11 NOTE — Consult Note (Signed)
WOC follow-up: Eakin pouch intact with good seal since yesterday to abd fistula wound. Large amt green drainage in suction cannister, suction catheter remains on to low wall suction. Supplies in room and instructions are available for bedside nurses if leakage occurs. Julien Girt MSN, RN, [place], [place], [place]

## 2015-05-11 NOTE — Progress Notes (Signed)
PARENTERAL NUTRITION CONSULT NOTE - FOLLOW UP  Pharmacy Consult for TPN Indication: EC fistula  No Known Allergies  Patient Measurements: Height: 5\' 4"  (162.6 cm) Weight: 218 lb 14.7 oz (99.3 kg) IBW/kg (Calculated) : 54.7  Weight 99.3 kg  Vital Signs: Temp: 98.6 F (37 C) (08/29 0519) Temp Source: Oral (08/28 2137) BP: 128/59 mmHg (08/29 0519) Pulse Rate: 79 (08/29 0519) Intake/Output from previous day: 08/28 0701 - 08/29 0700 In: 5524.4 [I.V.:2950; IRS:8546.2] Out: 2300 [Urine:1100; Drains:1200] Intake/Output from this shift: Total I/O In: -  Out: 400 [Urine:400] Labs:  Recent Labs  05/11/15 0600  WBC 8.0  HGB 7.8*  HCT 24.5*  PLT 182    Recent Labs  05/09/15 0556 05/10/15 0515 05/11/15 0600  NA 131* 134* 135  K 4.5 4.4 4.5  CL 105 108 111  CO2 18* 19* 20*  GLUCOSE 130* 130* 109*  BUN 71* 59* 48*  CREATININE 1.56* 1.43* 1.31*  CALCIUM 8.3* 8.5* 8.5*  MG  --   --  1.9  PHOS  --   --  4.1  PROT  --   --  5.4*  ALBUMIN  --   --  2.2*  AST  --   --  23  ALT  --   --  47  ALKPHOS  --   --  77  BILITOT  --   --  0.3  PREALBUMIN  --   --  26.5  TRIG  --   --  107   Estimated Creatinine Clearance: 43.1 mL/min (by C-G formula based on Cr of 1.31).   Recent Labs  05/10/15 2011 05/10/15 2330 05/11/15 0635  GLUCAP 105* 164* 87   Medications:  Infusions:    Insulin Requirements in the past 24 hours:  5 units of SSI/24h  Nutritional Goals: per RD on 8/26 Protein: 100-110 g  Kcal: 1700-1900  Fluid: 1.7-1.9 L  Current Nutrition:  Chronic TPN per [place] kcal, 70J protein   Cyclic Clinimix E 5/00: 1ml/hr x 1 hour, 165 ml/hr x 10 hours, 50 ml/hr x 1 hour. 20% IVFE at 20 ml/hr x 12 hours. This will provide 1672 kcal and 84g protein. Boost TID ordered (NPO)  NPO  Assessment: Continue chronic home TPN. Extensive h/o abdominal surgeries  Surgeries/Procedures:  8/16: s/p abdominal wound exploration with removal of infected  abdominal mesh  GI: EC fistula, abdominal wound. Drain in place with 1272ml output yesterday 8/28. Sandostatin added on 8/22 to help with closure and increased on 8/24. If unable to help close, may need to address surgical interventions to close fistula. No N/V or abdominal pain. Does have some flatus and last BM was 8/22.Continue to control drainage in hopes that fistula will spontaneously close..  IV PPI for h/o GERD, po B12. Prealbumin 26.5 Endo: h/o gout: left ankle pain/acute gouty flare s/p prednisone taper,  DM: CBGs well controlled requiring minimal SSI (87-130) Lytes: Lytes wnl (Gap 4 low) on NaBicarb tablet Renal: CKD3, SCr 1.31 down, UOP 0.5 ml/kg/hr Pulm: 100% RA, OSA Cards: h/o HTN, HF, HLD,  BP 128/59, HR 79 - metoprolol (Norvasc held) Hepatobil: LFTs WNL. TG 45>107, Tbili 0.3. Prealbumin 21. Albumin 2.6>2.2, on octreotide Neuro: A&O ID: Afebrile, WBC WNL, no abx Anticoag: h/o DVT-not on anticoagulation secondary to recurrent diverticular and gastric bleeding and iron deficiency anemia. Lovenox 40mg /d. Hgb down to 7.8 with chronic anemia Best Practices: PPI IV, CHX, Bactroban  TPN Access: PICC TPN start date: chronic  Plan:  -  Continue cyclic Clinimix E 0/98 - Total volume of 1800 mL over 12 hours== 54ml/hr x 1 hour, 165 ml/hr x 10 hours, 18ml/hr x 1 hour.  - Continue cyclic 11% IVFE at 20 ml/hr x 12 hours. - TPN + IVFE will provide 1758 kcal (100% of needs) and 90 g of protein (90% of needs)  - Continue MVI and trace elements in TPN - Continue moderate SSI - F/u labs and surgery plans    Marissa [name], PharmD, [facility] Clinical Staff Pharmacist Pager [phone]   05/11/2015 9:27 AM

## 2015-05-11 NOTE — Progress Notes (Signed)
PT Cancellation Note  Patient Details Name: Marissa [name] MRN: [id number] DOB: 1941-07-27   Cancelled Treatment:    Reason Eval/Treat Not Completed: PT screened, no needs identified, will sign off. Pt d/c'd from PT 8/26 due to ambulating independently. Spoke with RN and she also reported patient to be independent in room. Pt with no further acute PT skilled needs. PT signing off.   Kingsley Callander 05/11/2015, 1:38 PM  Kittie Plater, PT, DPT Pager #: [phone] Office #: [phone]

## 2015-05-11 NOTE — Progress Notes (Signed)
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 The patient ultimately went for I&D and explantation of infected abdominal mesh on 04/28/2015. She also has PMH of CAD, s/p OM BMS in August 2007 with ISR treated with a DES April 2009, Cath Done in August 2009 showed patency of the stent, Myoview October 2015 was low risk and echo at that time showed EF 55-60 percent. Cardiology evaluated her during recent hospitalization, determined that her cardiac status was stable and cleared her for laparotomy on 03/20/15. She also has PMH of HTN, stage III chronic kidney disease, chronic diastolic CHF, gout, OSA, GERD, HLD, diet controlled DM, DVT-not on anticoagulation secondary to recurrent diverticular and gastric bleeding and iron deficiency anemia.  Assessment/Plan: #1 enterocutaneous fistula Status post abdominal wound exploration/explantation of infected abdominal mesh per Dr.Tsuei 04/28/2015. Patient is currently afebrile. Patient on TPN. Eakins pouch was leaking 2 days ago and yesterday, however seems to have resolved and stabilized with no further leaking. Per primary team.  #2 hypertension Blood pressure was initially borderline and as  such BP meds were held. BP trending down. D/C'd norvasc. Continue low-dose metoprolol. Will hold off on resuming ARB in the setting of acute on chronic renal failure.  #3 acute on chronic kidney disease stage III Patient's baseline creatinine was 1.1-1.4. Renal function trending seems to be trending back down with hydration. Creatinine currently at 1.31. NSL IV fluids. Continue gentle hydration for another 24 hours. Continue to hold ARB. Follow.    #4 acute on chronic diastolic CHF Stable. Compensated.   #5 obstructive sleep apnea CPAP daily at bedtime.  #6 gastroesophageal reflux disease PPI.  #7 well-controlled type 2 diabetes mellitus diet-controlled Hemoglobin A1c was 5.7 on 03/14/2015. CBGs have ranged from 87-164. Continue sliding scale insulin.  #8 history of DVT Not anticoagulation candidate secondary to recurrent diverticular gastric bleeding.  #9 iron deficiency anemia H&H stable. Continue iron supplementation.  #10 coronary artery disease Stable. Continue beta blocker.  #11 generalized weakness Likely secondary to deconditioning. UA negative for UTI. PT.  #12 left ankle pain/acute gouty flare Status post prednisone taper. Resolved.   #13 prophylaxis PPI for GI prophylaxis. Lovenox for DVT prophylaxis.  Code Status: Full Family Communication: Updated patient. No family at bedside. Disposition Plan: Per primary team.   Consultants:  [facility]: Dr Algis Liming 04/27/2015  Procedures:  Chest x-ray 04/27/2015  Abdominal wound exploration/explantation of infected abdominal mesh per Dr. Georgette Dover 04/28/2015  CT abdomen and pelvis 05/06/2015  X-ray left ankle 05/04/2015  Antibiotics:  None  HPI/Subjective: Patient with no shortness of breath. No chest pain. Leaking around [place] yesterday which has improved.   Objective: Filed Vitals:   05/11/15 0519  BP: 128/59  Pulse: 79  Temp: 98.6 F (37 C)  Resp: 17    Intake/Output Summary (Last  24 hours) at 05/11/15 1244 Last data filed at 05/11/15 0802  Gross per 24 hour  Intake  5524.42 ml  Output   1100 ml  Net 4424.42 ml   Filed Weights   05/01/15 0805  Weight: 99.3 kg (218 lb 14.7 oz)    Exam:   General:  NAD  Cardiovascular: RRR  Respiratory: CTAB anterior lung fields  Abdomen: Soft, + bowel sounds, some tenderness to palpation.Eakin pouch intact.  Musculoskeletal: No clubbing cyanosis or edema.  Data Reviewed: Basic Metabolic Panel:  Recent Labs Lab 05/07/15 0602 05/08/15 0846 05/09/15 0556 05/10/15 0515 05/11/15 0600  NA 131* 130* 131* 134* 135  K 4.8 4.6 4.5 4.4 4.5  CL 101 103 105 108 111  CO2 20* 20* 18* 19* 20*  GLUCOSE 119* 151* 130* 130* 109*  BUN 83* 77* 71* 59* 48*  CREATININE 1.68* 1.68* 1.56* 1.43* 1.31*  CALCIUM 9.0 8.4* 8.3* 8.5* 8.5*  MG 2.3  --   --   --  1.9  PHOS 4.7*  --   --   --  4.1   Liver Function Tests:  Recent Labs Lab 05/07/15 0602 05/11/15 0600  AST 63* 23  ALT 109* 47  ALKPHOS 85 77  BILITOT 0.2* 0.3  PROT 7.3 5.4*  ALBUMIN 2.6* 2.2*   No results for input(s): LIPASE, AMYLASE in the last 168 hours. No results for input(s): AMMONIA in the last 168 hours. CBC:  Recent Labs Lab 05/07/15 0602 05/08/15 0846 05/11/15 0600  WBC 8.1 8.4 8.0  NEUTROABS  --   --  5.9  HGB 8.5* 8.3* 7.8*  HCT 27.9* 27.1* 24.5*  MCV 71.5* 72.5* 71.2*  PLT 288 252 182   Cardiac Enzymes: No results for input(s): CKTOTAL, CKMB, CKMBINDEX, TROPONINI in the last 168 hours. BNP (last 3 results)  Recent Labs  11/02/14 2340 03/19/15 1325  BNP 109.2* 141.6*    ProBNP (last 3 results) No results for input(s): PROBNP in the last 8760 hours.  CBG:  Recent Labs Lab 05/10/15 0632 05/10/15 1148 05/10/15 2011 05/10/15 2330 05/11/15 0635  GLUCAP 120* 130* 105* 164* 87    No results found for this or any previous visit (from the past 240 hour(s)).   Studies: No results found.  Scheduled Meds: . Chlorhexidine  Gluconate Cloth  6 each Topical Q0600  . enoxaparin (LOVENOX) injection  40 mg Subcutaneous Q24H  . insulin aspart  0-15 Units Subcutaneous 4 times per day  . iron polysaccharides  150 mg Oral Daily  . metoprolol tartrate  12.5 mg Oral q morning - 10a  . mupirocin ointment   Nasal BID  . octreotide  100 mcg Subcutaneous 3 times per day  . pantoprazole (PROTONIX) IV  40 mg Intravenous QHS  . sodium bicarbonate  650 mg Oral BID  . cyanocobalamin  2,000 mcg Oral Daily   Continuous Infusions: . Marland KitchenTPN (CLINIMIX-E) Adult     And  . fat emulsion      Principal Problem:   Enterocutaneous fistula Active Problems:   OSA (obstructive sleep apnea)   Essential hypertension   CAD S/P OM BMS 8/07, ISR-OM DES 8/09   GERD   Chronic kidney disease, stage 3   DVT, HX OF   Hyperlipidemia with target LDL less than 100   Anemia, iron deficiency   Type 2 diabetes mellitus without complication   Hyperkalemia   Acute idiopathic gout of left ankle   Hyponatremia    Time spent: 46 minutes    THOMPSON,DANIEL M.D. [facility] Pager [phone]. If 7PM-7AM, please contact night-coverage at [email or web address], password [facility] 05/11/2015, 12:44 PM  LOS: 14 days

## 2015-05-11 NOTE — Progress Notes (Signed)
13 Days Post-Op  Subjective: She is up in the chair, no walking over the weekend.  Looks comfortable, no pain issues.  Drainage issue still present, drainage appears to be increasing not going down.  Some of it may be recording, she is now on suction so we may be capturing more of the drainage on I/O.  Objective: Vital signs in last 24 hours: Temp:  [97.5 F (36.4 C)-98.6 F (37 C)] 98.6 F (37 C) (08/29 0519) Pulse Rate:  [62-79] 79 (08/29 0519) Resp:  [17-18] 17 (08/29 0519) BP: (98-135)/(53-62) 128/59 mmHg (08/29 0519) SpO2:  [100 %] 100 % (08/29 0519) Last BM Date: 05/04/15 1200 from fistula yesterday, 2300 from fistula 8/28,  Not recorded 8/27, 200 recorded on 8/26 just first shift Afebrile, VSS Creatinine is 1.31 stable,  H/H continues to drift down slowly Intake/Output from previous day: 08/28 0701 - 08/29 0700 In: 5524.4 [I.V.:2950; ZES:9233.0] Out: 2300 [Urine:1100; Drains:1200] Intake/Output this shift: Total I/O In: -  Out: 400 [Urine:400]  General appearance: alert, cooperative and no distress Resp: clear to auscultation bilaterally GI: soft, few BS, drainage is green.  Lab Results:   Recent Labs  05/11/15 0600  WBC 8.0  HGB 7.8*  HCT 24.5*  PLT 182    BMET  Recent Labs  05/10/15 0515 05/11/15 0600  NA 134* 135  K 4.4 4.5  CL 108 111  CO2 19* 20*  GLUCOSE 130* 109*  BUN 59* 48*  CREATININE 1.43* 1.31*  CALCIUM 8.5* 8.5*   PT/INR No results for input(s): LABPROT, INR in the last 72 hours.   Recent Labs Lab 05/07/15 0602 05/11/15 0600  AST 63* 23  ALT 109* 47  ALKPHOS 85 77  BILITOT 0.2* 0.3  PROT 7.3 5.4*  ALBUMIN 2.6* 2.2*     Lipase     Component Value Date/Time   LIPASE 27 12/26/2014 1815     Studies/Results: No results found.  Medications: . Chlorhexidine Gluconate Cloth  6 each Topical Q0600  . enoxaparin (LOVENOX) injection  40 mg Subcutaneous Q24H  . insulin aspart  0-15 Units Subcutaneous 4 times per day  . iron  polysaccharides  150 mg Oral Daily  . metoprolol tartrate  12.5 mg Oral q morning - 10a  . mupirocin ointment   Nasal BID  . octreotide  100 mcg Subcutaneous 3 times per day  . pantoprazole (PROTONIX) IV  40 mg Intravenous QHS  . sodium bicarbonate  650 mg Oral BID  . cyanocobalamin  2,000 mcg Oral Daily    Assessment/Plan Enterocutaneous fistula with infected abdominal mesh S/p Abdominal wound exploration/ explantation of infected abdominal mesh, 04/28/15, Dr. Georgette Dover POD 13 S/p left hemicolectomy; Dr. Lucia Gaskins 2003, Incarcerated hernia repair, Dr. Hassell Done 2009, hysterectomy with post op EC fistula, re-exploration of the wound and conservative management Dr. Georgette Dover 2013, healed in January 2014 follow up CKD stage III Hx of hypertension AODM Chronic anemia  Malnutrition on TNA Antibtiotics: None  Dx of DVT: Lovenox/SCD   Plan:  Continue to monitor drainage and continue TNA.  PT an OT to help with deconditioning.    LOS: 14 days    Marissa [name] 05/11/2015

## 2015-05-11 NOTE — Care Management Important Message (Signed)
Important Message  Patient Details  Name: Marissa [name] MRN: [id number] Date of Birth: Sep 26, 1940   Medicare Important Message Given:  Yes-fourth notification given    Delorse Lek 05/11/2015, 10:56 AM

## 2015-05-12 DIAGNOSIS — E875 Hyperkalemia: Secondary | ICD-10-CM

## 2015-05-12 DIAGNOSIS — E871 Hypo-osmolality and hyponatremia: Secondary | ICD-10-CM

## 2015-05-12 LAB — GLUCOSE, CAPILLARY
GLUCOSE-CAPILLARY: 106 mg/dL — AB (ref 65–99)
GLUCOSE-CAPILLARY: 119 mg/dL — AB (ref 65–99)
GLUCOSE-CAPILLARY: 164 mg/dL — AB (ref 65–99)
GLUCOSE-CAPILLARY: 172 mg/dL — AB (ref 65–99)

## 2015-05-12 LAB — BASIC METABOLIC PANEL WITH GFR
Anion gap: 5 (ref 5–15)
BUN: 42 mg/dL — ABNORMAL HIGH (ref 6–20)
CO2: 20 mmol/L — ABNORMAL LOW (ref 22–32)
Calcium: 8.5 mg/dL — ABNORMAL LOW (ref 8.9–10.3)
Chloride: 106 mmol/L (ref 101–111)
Creatinine, Ser: 1.23 mg/dL — ABNORMAL HIGH (ref 0.44–1.00)
GFR calc Af Amer: 49 mL/min — ABNORMAL LOW
GFR calc non Af Amer: 42 mL/min — ABNORMAL LOW
Glucose, Bld: 126 mg/dL — ABNORMAL HIGH (ref 65–99)
Potassium: 4.7 mmol/L (ref 3.5–5.1)
Sodium: 131 mmol/L — ABNORMAL LOW (ref 135–145)

## 2015-05-12 MED ORDER — OCTREOTIDE ACETATE 100 MCG/ML IJ SOLN
200.0000 ug | Freq: Three times a day (TID) | INTRAMUSCULAR | Status: DC
  Administered 2015-05-12 – 2015-05-14 (×6): 200 ug via SUBCUTANEOUS
  Filled 2015-05-12 (×10): qty 2

## 2015-05-12 MED ORDER — FAT EMULSION 20 % IV EMUL
240.0000 mL | INTRAVENOUS | Status: AC
Start: 2015-05-12 — End: 2015-05-13
  Administered 2015-05-12: 240 mL via INTRAVENOUS
  Filled 2015-05-12: qty 250

## 2015-05-12 MED ORDER — TRACE MINERALS CR-CU-MN-SE-ZN 10-1000-500-60 MCG/ML IV SOLN
INTRAVENOUS | Status: AC
  Administered 2015-05-12: 18:00:00 via INTRAVENOUS
  Filled 2015-05-12: qty 1800

## 2015-05-12 NOTE — Consult Note (Signed)
WOC follow-up: Eakin pouch intact with good seal since Sunday to abd fistula wound. Mod amt green drainage in suction cannister, which remain to low wall suction. Supplies in room and instructions are available for bedside nurses if leakage occurs. Julien Girt MSN, RN, [place], [place], [place]

## 2015-05-12 NOTE — Progress Notes (Signed)
OT Screen Note/ Discharge  Patient Details Name: Haylin Camilli MRN: [id number] DOB: March 03, 1941   Cancelled Treatment:    Reason Eval/Treat Not Completed: OT screened, no needs identified, will sign off. OT spoke directly to RN and patient with no acute needs at this time. Pt is agreeable  Vonita Moss   OTR/L Pager: [phone] Office: [phone] .  05/12/2015, 12:11 PM

## 2015-05-12 NOTE — Discharge Summary (Signed)
[facility] Discharge Summary   Patient ID: Marissa [name] MRN: [id number] DOB/AGE: 10/29/1940 74 y.o.  Admit date: 04/27/2015 Discharge date: 05/19/2015  Admitting Diagnosis: Enterocutaneous fistula Infected abdominal wall mesh DM Fe deficiency anemia Obesity OSA Gout HTN CAD  Discharge Diagnosis Patient Active Problem List   Diagnosis Date Noted  . Hyponatremia 05/10/2015  . Acute idiopathic gout of left ankle   . Hyperkalemia 05/04/2015  . Type 2 diabetes mellitus without complication   . D-dimer, elevated 04/16/2015  . Precordial chest pain 04/15/2015  . 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 Abdominal wall abscess 03/13/2015  . Non-compliant behavior 01/22/2015  . Anemia, iron deficiency 12/21/2014  . Diverticulosis of colon with hemorrhage 12/20/2014  . Chronic diastolic CHF (congestive heart failure) 06/29/2014  . Routine general medical examination at a health care facility 05/30/2014  . Dementia arising in the senium and presenium 03/29/2013  . Acute gouty arthritis 02/15/2012  . Other screening mammogram 02/15/2012  . Osteopenia 02/15/2012  . 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 Hyperlipidemia with target LDL less than 100 10/06/2011  . OSA (obstructive sleep apnea) 04/02/2009  . GERD 08/20/2006  . Gout 08/15/2006  . Essential hypertension 08/15/2006  . CAD S/P OM BMS 8/07, ISR-OM DES 8/09 08/15/2006  . Chronic kidney disease, stage 3 08/15/2006  . DVT, HX OF 08/15/2006    Consultants Dr. Algis Liming, Dr. Grandville Silos, Dr. Carles Collet  Imaging: No results found.  Procedures Dr. Georgette Dover (04/28/15) - Abdominal wound exploration/ explantation of infected abdominal mesh, placement of penrose drain  Hospital Course:  The patient is a 74 year old female who presents with a subcutaneous abscess and a very complex past surgical history. In 2003 she underwent a left hemicolectomy with left transverse colon to sigmoid colon anastomosis by Dr. Alphonsa Overall for  left colon diverticulitis. He also repaired her umbilical hernia and removed her appendix. In 2009 she underwent repair of incarcerated ventral incisional hernia by Dr. Johnathan Hausen. This was repaired with Ventralex mesh secured with Prolene sutures. In 2013 she underwent an attempted laparoscopic hysterectomy converted to an open total abdominal hysterectomy with bilateral salpingo-oophorectomy. This required extensive enterolysis. The surgeon was Dr. Janie Morning. Several days after that surgery she returned to the hospital with foul-smelling fluid coming from her wound. 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 We managed this conservatively with a VAC dressing. The wound subsequently healed. She was last seen in our office in January 2014 with a completely healed wound. She has had several admissions for GI bleeding. In late June of this year she noticed some swelling over the middle of her midline scar. 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 She was managed with a Eakin's pouch, bowel rest, TPN, and Invanz. Fistula studies were unable to locate the location of her fistula. The fistula output has decreased. She was discharged after 15 days in the hospital. She saw Dr. Georgette Dover in the office for follow-up. Her wound has closed down to 2 very small openings with some purulent drainage. She is cycling on her TNA. She is tolerating some oral diet. She is having regular bowel movements.  At her most recent office visit she had purulent fluid coming from her small midline wound, but no sign of succus entericus or stool. The CT scan, however, shows evidence of an enterocutaneous fistula. Therefore, we have kept her on her cyclic TNA. The wound continues to just drain purulent fluid. Dr. Georgette Dover  admitted her to the hospital and proceeded with the procedure as above.    She was found to have  infected mesh with a chronic wound.  Dr. Georgette Dover could not identify an EC fistula at the time of surgery, but she soon developed significant bilious drainage from the wound.  The purulent drainage resolved, but the bilious output remained in high volume.  [facility] nursing had difficulty maintaining a good seal with the Eakins pouch.  Eventually the leaking was controlled, by adding a red rubber suction.  Since then we have been able to get an accurate measurement of drainage.  She was made NPO and cyclical TPN was resumed.  Sandostatin was started and ramped up to 351mcg.  Her output continued to fall and was around 400cc/day on the day prior to discharge.  Eventually the penrose drain fell out.  The medicine service helped manage her chronic medical problem.  She had a gout flair of her left ankle.  On POD 21, the patient was voiding well, tolerating TPN, ambulating well, pain well controlled, vital signs stable, wound clean and fistula controlled with eakins pouch, and felt stable for discharge to [facility].  Patient will follow up in our office once discharged from [facility] and knows to call with questions or concerns.  I have arranged a follow up appointment in 6 weeks from now.   Physical Exam: General:  Alert, NAD, pleasant, comfortable Abd: Soft, ND, Non-tender on wound, slight tenderness in the left side of her abdomen, no ecchymosis or induration, open visible wound with bilious drainage (470mL output), +BS, no HSM, good granulation tissue forming      Medication List    STOP taking these medications        amLODipine 10 MG tablet  Commonly known as:  NORVASC     BENICAR 20 MG tablet  Generic drug:  olmesartan     HYDROcodone-acetaminophen 5-325 MG per tablet  Commonly known as:  NORCO/VICODIN      TAKE these medications        colchicine 0.6 MG tablet  TAKE ONE TABLET BY MOUTH DAILY AS NEEDED     cyanocobalamin 2000 MCG tablet  Take 1 tablet (2,000 mcg total) by mouth daily.     FERIVA  21/7 75-1 MG Tabs  Take 1 tablet by mouth daily.     furosemide 20 MG tablet  Commonly known as:  LASIX  Take 20 mg by mouth daily as needed for fluid.     methocarbamol 500 MG tablet  Commonly known as:  ROBAXIN  Take 1 tablet (500 mg total) by mouth every 8 (eight) hours as needed for muscle spasms.     metoprolol tartrate 25 MG tablet  Commonly known as:  LOPRESSOR  Take 1 tablet (25 mg total) by mouth every morning.     NITROSTAT 0.4 MG SL tablet  Generic drug:  nitroGLYCERIN  DISSOLVE ONE TABLET UNDER THE TONGUE EVERY 5 MINUTES AS NEEDED FOR CHEST PAIN.  DO NOT EXCEED A TOTAL OF 3 DOSES IN 15 MINUTES     octreotide 100 MCG/ML Soln injection  Commonly known as:  SANDOSTATIN  Inject 3 mLs (300 mcg total) into the skin every 8 (eight) hours.     oxyCODONE 5 MG immediate release tablet  Commonly known as:  Oxy IR/ROXICODONE  Take 1 tablet (5 mg total) by mouth every 6 (six) hours as needed for moderate pain.     pantoprazole 40 MG tablet  Commonly known as:  PROTONIX  Take 1 tablet (40 mg total) by mouth daily.     VOLTAREN 1 % Gel  Generic drug:  diclofenac sodium  Apply 2 g topically 4 (four) times daily as needed (pain).         Follow-up Information    Follow up with Maia Petties., MD. Daphane Shepherd on 06/26/2015.   Specialty:  General Surgery   Why:  For post-operation check with your Surgeon.  Appointment on 06/26/15 @ 10:40am, please arrive 15 minutes eary to check in and fill out paperwork.   Contact information:   [place] [address] [phone]       Signed: [name], [facility] [phone]  05/15/2015, 11:35 AM

## 2015-05-12 NOTE — Progress Notes (Signed)
[facility] PROGRESS NOTE/Consult  Marissa [name] ZOX:[id number] DOB: Mar 27, 1941 DOA: 04/27/2015 PCP: [name], MD  Brief History 74 year old female patient with extensive past surgical history-left hemicolectomy with left transverse colon to sigmoid colon anastomosis/appendectomy/umbilical hernia repair (2003), repair of incarcerated ventral incisional hernia (2009), TAH with bilateral salpingo-oophorectomy with extensive enterolysis in [id number] complicated by enterocutaneous fistula (2013) which subsequently healed, June 2016 admitted again for large subcutaneous abscess and enterocutaneous fistula-managed with Eaken's pouch, bowel rest, TPN and Invanz, followed up by primary surgeon in office and due to concern for persisting underlying abscess patient admitted to hospital on 04/27/15 with plans for I & D. The patient ultimately went for I&D and explantation of infected abdominal mesh on 04/28/2015. She also has PMH of CAD, s/p OM BMS in August 2007 with ISR treated with a DES April 2009, Cath Done in August 2009 showed patency of the stent, Myoview October 2015 was low risk and echo at that time showed EF 55-60 percent. Cardiology evaluated her during recent hospitalization, determined that her cardiac status was stable and cleared her for laparotomy on 03/20/15. She also has PMH of HTN, stage III chronic kidney disease, chronic diastolic CHF, gout, OSA, GERD, HLD, diet controlled DM, DVT-not on anticoagulation secondary to recurrent diverticular and gastric bleeding and iron deficiency anemia.  Assessment/Plan: #1 enterocutaneous fistula Status post abdominal wound exploration/explantation of infected abdominal mesh per Dr.Tsuei 04/28/2015. Patient is currently afebrile. Patient on TPN. Eakins pouch was leaking 2 days ago and yesterday, however seems to have resolved and stabilized with no further leaking. Per primary team.  #2 hypertension Blood pressure was initially borderline and as  such BP meds were held. BP trending down. D/C'd norvasc. Continue low-dose metoprolol. Will hold off on resuming ARB in the setting of acute on chronic renal failure.  #3 acute on chronic kidney disease stage III Patient's baseline creatinine was 1.1-1.4. Renal function trending seems to be trending back down with hydration. Creatinine currently at 1.31. NSL IV fluids. Continue gentle hydration for another 24 hours. Continue to hold ARB. Follow.    #4 acute on chronic diastolic CHF Stable. Compensated.   #5 obstructive sleep apnea CPAP daily at bedtime.  #6 gastroesophageal reflux disease PPI.  #7 well-controlled type 2 diabetes mellitus diet-controlled Hemoglobin A1c was 5.7 on 03/14/2015. CBGs have ranged from 106-164. Continue sliding scale insulin.  #8 history of DVT Not anticoagulation candidate secondary to recurrent diverticular gastric bleeding.  #9 iron deficiency anemia H&H stable. Continue iron supplementation.  #10 coronary artery disease Stable. Continue beta blocker.  #11 generalized weakness Likely secondary to deconditioning. UA negative for UTI. PT.  #12 left ankle pain/acute gouty flare Status post prednisone taper. Resolved.   #13 prophylaxis PPI for GI prophylaxis. Lovenox for DVT prophylaxis.   Nothing further to add will sign off. Call with questions.    Code Status: Full Family Communication: Updated patient. No family at bedside. Disposition Plan: Per primary team.   Consultants:  [facility]: Dr Algis Liming 04/27/2015  Procedures:  Chest x-ray 04/27/2015  Abdominal wound exploration/explantation of infected abdominal mesh per Dr. Georgette Dover 04/28/2015  CT abdomen and pelvis 05/06/2015  X-ray left ankle 05/04/2015  Antibiotics:  None  HPI/Subjective: Patient with no shortness of breath. No chest pain. Leaking around [place] pouch has improved.   Objective: Filed Vitals:   05/12/15 1353  BP: 119/58  Pulse: 78  Temp: 98.3 F  (36.8 C)  Resp: 18    Intake/Output Summary (Last 24 hours) at 05/12/15 1520 Last  data filed at 05/12/15 1400  Gross per 24 hour  Intake 2541.17 ml  Output   2750 ml  Net -208.83 ml   Filed Weights   05/01/15 0805  Weight: 99.3 kg (218 lb 14.7 oz)    Exam:   General:  NAD  Cardiovascular: RRR  Respiratory: CTAB anterior lung fields  Abdomen: Soft, + bowel sounds, some tenderness to palpation.Eakin pouch intact.  Musculoskeletal: No clubbing cyanosis or edema.  Data Reviewed: Basic Metabolic Panel:  Recent Labs Lab 05/07/15 0602 05/08/15 0846 05/09/15 0556 05/10/15 0515 05/11/15 0600 05/12/15 0603  NA 131* 130* 131* 134* 135 131*  K 4.8 4.6 4.5 4.4 4.5 4.7  CL 101 103 105 108 111 106  CO2 20* 20* 18* 19* 20* 20*  GLUCOSE 119* 151* 130* 130* 109* 126*  BUN 83* 77* 71* 59* 48* 42*  CREATININE 1.68* 1.68* 1.56* 1.43* 1.31* 1.23*  CALCIUM 9.0 8.4* 8.3* 8.5* 8.5* 8.5*  MG 2.3  --   --   --  1.9  --   PHOS 4.7*  --   --   --  4.1  --    Liver Function Tests:  Recent Labs Lab 05/07/15 0602 05/11/15 0600  AST 63* 23  ALT 109* 47  ALKPHOS 85 77  BILITOT 0.2* 0.3  PROT 7.3 5.4*  ALBUMIN 2.6* 2.2*   No results for input(s): LIPASE, AMYLASE in the last 168 hours. No results for input(s): AMMONIA in the last 168 hours. CBC:  Recent Labs Lab 05/07/15 0602 05/08/15 0846 05/11/15 0600  WBC 8.1 8.4 8.0  NEUTROABS  --   --  5.9  HGB 8.5* 8.3* 7.8*  HCT 27.9* 27.1* 24.5*  MCV 71.5* 72.5* 71.2*  PLT 288 252 182   Cardiac Enzymes: No results for input(s): CKTOTAL, CKMB, CKMBINDEX, TROPONINI in the last 168 hours. BNP (last 3 results)  Recent Labs  11/02/14 2340 03/19/15 1325  BNP 109.2* 141.6*    ProBNP (last 3 results) No results for input(s): PROBNP in the last 8760 hours.  CBG:  Recent Labs Lab 05/11/15 1202 05/11/15 1951 05/11/15 2257 05/12/15 0642 05/12/15 1119  GLUCAP 136* 176* 164* 106* 119*    No results found for this or  any previous visit (from the past 240 hour(s)).   Studies: No results found.  Scheduled Meds: . Chlorhexidine Gluconate Cloth  6 each Topical Q0600  . enoxaparin (LOVENOX) injection  40 mg Subcutaneous Q24H  . insulin aspart  0-15 Units Subcutaneous 4 times per day  . iron polysaccharides  150 mg Oral Daily  . metoprolol tartrate  12.5 mg Oral q morning - 10a  . mupirocin ointment   Nasal BID  . octreotide  200 mcg Subcutaneous 3 times per day  . pantoprazole (PROTONIX) IV  40 mg Intravenous QHS  . sodium bicarbonate  650 mg Oral BID  . cyanocobalamin  2,000 mcg Oral Daily   Continuous Infusions: . Marland KitchenTPN (CLINIMIX-E) Adult     And  . fat emulsion      Principal Problem:   Enterocutaneous fistula Active Problems:   OSA (obstructive sleep apnea)   Essential hypertension   CAD S/P OM BMS 8/07, ISR-OM DES 8/09   GERD   Chronic kidney disease, stage 3   DVT, HX OF   Hyperlipidemia with target LDL less than 100   Anemia, iron deficiency   Type 2 diabetes mellitus without complication   Hyperkalemia   Acute idiopathic gout of left ankle   Hyponatremia  Time spent: 55 minutes    THOMPSON,DANIEL M.D. [facility] Pager [phone]. If 7PM-7AM, please contact night-coverage at [email or web address], password [facility] 05/12/2015, 3:20 PM  LOS: 15 days

## 2015-05-12 NOTE — Progress Notes (Signed)
PARENTERAL NUTRITION CONSULT NOTE - FOLLOW UP  Pharmacy Consult for TPN Indication: EC fistula  No Known Allergies  Patient Measurements: Height: 5\' 4"  (162.6 cm) Weight: 218 lb 14.7 oz (99.3 kg) IBW/kg (Calculated) : 54.7  Weight 99.3 kg  Vital Signs: Temp: 98.5 F (36.9 C) (08/30 0649) Temp Source: Oral (08/30 0649) BP: 108/56 mmHg (08/30 0649) Pulse Rate: 85 (08/30 0649) Intake/Output from previous day: 08/29 0701 - 08/30 0700 In: 3191.2 [P.O.:60; I.V.:1110; TPN:2021.2] Out: 3150 [Urine:2300; Drains:850] Intake/Output from this shift: Total I/O In: 0  Out: 500 [Urine:500] Labs:  Recent Labs  05/11/15 0600  WBC 8.0  HGB 7.8*  HCT 24.5*  PLT 182    Recent Labs  05/10/15 0515 05/11/15 0600 05/12/15 0603  NA 134* 135 131*  K 4.4 4.5 4.7  CL 108 111 106  CO2 19* 20* 20*  GLUCOSE 130* 109* 126*  BUN 59* 48* 42*  CREATININE 1.43* 1.31* 1.23*  CALCIUM 8.5* 8.5* 8.5*  MG  --  1.9  --   PHOS  --  4.1  --   PROT  --  5.4*  --   ALBUMIN  --  2.2*  --   AST  --  23  --   ALT  --  47  --   ALKPHOS  --  77  --   BILITOT  --  0.3  --   PREALBUMIN  --  26.5  --   TRIG  --  107  --    Estimated Creatinine Clearance: 45.9 mL/min (by C-G formula based on Cr of 1.23).   Recent Labs  05/11/15 1951 05/11/15 2257 05/12/15 0642  GLUCAP 176* 164* 106*   Medications:    Insulin Requirements in the past 24 hours:  8 units of VEH/20N (6 units on cyclic TPN)  Nutritional Goals: per RD on 8/26 Protein: 100-110 g  Kcal: 1700-1900  Fluid: 1.7-1.9 L  Current Nutrition:  -Chronic TPN per [place] kcal, 47S protein   -Cyclic Clinimix E 9/62: 74ml/hr x 1 hour, 165 ml/hr x 10 hours, 50 ml/hr x 1 hour. 20% IVFE at 20 ml/hr x 12 hours==This will provide 1759 kcal and 90g protein. Boost TID ordered (NPO)  NPO  Assessment: Continue chronic home TPN. Extensive h/o abdominal surgeries  Surgeries/Procedures:  8/16: s/p abdominal wound exploration with  removal of infected abdominal mesh  GI: EC fistula with infected abdominal mesh, abdominal wound. Drain in place with 1259ml>>850 output yesterday 8/29. Sandostatin added on 8/22 to help with closure and increased on 8/24. If unable to help close, may need to address surgical interventions to close fistula. No N/V or abdominal pain. Does have some flatus and last BM was 8/22.Continue to control drainage in hopes that fistula will spontaneously close..  IV PPI for h/o GERD, po B12. Prealbumin 26.5 Endo: h/o gout: left ankle pain/acute gouty flare s/p prednisone taper,  DM: CBGs well controlled requiring minimal SSI (106-176) Lytes: Bicarb 20 (Gap 5) on NaBicarb tablet Renal: CKD3, SCr 1.23 down, UOP 1 ml/kg/hr Pulm: 100% RA, OSA Cards: h/o HTN, HF, HLD,  BP 108/56, HR 85 - metoprolol (Norvasc held) Hepatobil: LFTs WNL. TG 45>107, Tbili 0.3. Prealbumin 26.5.Alumin 2.6>2.2, on octreotide Neuro: A&O ID: Afebrile, WBC WNL, no abx Anticoag: h/o DVT-not on anticoagulation secondary to recurrent diverticular and gastric bleeding and iron deficiency anemia (po iron). Lovenox 40mg /d. Hgb down to 7.8 with chronic anemia Best Practices: PPI IV, CHX, Bactroban, LMWH  TPN Access: PICC  TPN start date: chronic  Plan:  - Continue cyclic Clinimix E 3/15 - Total volume of 1800 mL over 12 hours== 73ml/hr x 1 hour, 165 ml/hr x 10 hours, 93ml/hr x 1 hour.  - Continue cyclic 17% IVFE at 20 ml/hr x 12 hours. - TPN + IVFE will provide 1758 kcal (100% of needs) and 90 g of protein (90% of needs)  - Continue MVI and trace elements in TPN - Continue moderate SSI - F/u labs and surgery plans   Annie Saephan S. Alford Highland, PharmD, [place] Clinical Staff Pharmacist Pager [phone]   05/12/2015 9:22 AM

## 2015-05-12 NOTE — Progress Notes (Signed)
[facility] Progress Note  14 Days Post-Op  Subjective: Pt doing okay.  No N/V, no pain since NPO.  Mobilizing well.  Fistula output is still 847mL bilious output.    Objective: Vital signs in last 24 hours: Temp:  [97.4 F (36.3 C)-98.5 F (36.9 C)] 98.5 F (36.9 C) (08/30 0649) Pulse Rate:  [68-85] 85 (08/30 0649) Resp:  [17-18] 18 (08/30 0649) BP: (108-116)/(51-60) 108/56 mmHg (08/30 0649) SpO2:  [100 %] 100 % (08/30 0649) Last BM Date: 05/04/15  Intake/Output from previous day: 08/29 0701 - 08/30 0700 In: 3191.2 [P.O.:60; I.V.:1110; TPN:2021.2] Out: 3150 [Urine:2300; Drains:850] Intake/Output this shift: Total I/O In: 0  Out: 500 [Urine:500]  PE: Gen:  Alert, NAD, pleasant Abd: Soft, ND, Non-tender around suction tubing in wound, open visible wound with bilious drainage (852mL output), +BS, no HSM   Lab Results:   Recent Labs  05/11/15 0600  WBC 8.0  HGB 7.8*  HCT 24.5*  PLT 182   BMET  Recent Labs  05/11/15 0600 05/12/15 0603  NA 135 131*  K 4.5 4.7  CL 111 106  CO2 20* 20*  GLUCOSE 109* 126*  BUN 48* 42*  CREATININE 1.31* 1.23*  CALCIUM 8.5* 8.5*   PT/INR No results for input(s): LABPROT, INR in the last 72 hours. CMP     Component Value Date/Time   NA 131* 05/12/2015 0603   K 4.7 05/12/2015 0603   CL 106 05/12/2015 0603   CO2 20* 05/12/2015 0603   GLUCOSE 126* 05/12/2015 0603   BUN 42* 05/12/2015 0603   CREATININE 1.23* 05/12/2015 0603   CREATININE 1.64* 06/27/2014 1647   CALCIUM 8.5* 05/12/2015 0603   PROT 5.4* 05/11/2015 0600   ALBUMIN 2.2* 05/11/2015 0600   AST 23 05/11/2015 0600   ALT 47 05/11/2015 0600   ALKPHOS 77 05/11/2015 0600   BILITOT 0.3 05/11/2015 0600   GFRNONAA 42* 05/12/2015 0603   GFRAA 49* 05/12/2015 0603   Lipase     Component Value Date/Time   LIPASE 27 12/26/2014 1815       Studies/Results: No results found.  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 -Ambulate and IS -SCD's and lovenox -Appreciate medicines help -May need to re address surgical interventions, Dr. Marlou Starks and Dr. Georgette Dover discussing. -CT scan 05/06/15 showed mid small bowel EC fistula draining into the lower margin of the abdominal wall defect.  Gout flare (left ankle) - resolved CKD Stage 3 - Cr 1.23 Disp - Appreciate medicine service.  Continue inpatient today, await improvement in drain output so we can discontinue suction prior to going home    LOS: 15 days    [name] 05/12/2015, 10:46 AM Pager: [phone]

## 2015-05-12 NOTE — Care Management Note (Signed)
Case Management Note  Patient Details  Name: Goldye Tourangeau MRN: [id number] Date of Birth: 07-Apr-1941  Subjective/Objective:                    Action/Plan:  Received LTAC referral from [place] . Referral made to Santiago Glad with [facility] and Seth Bake with [facility] . Both will review patient clinicals and check insurance benefits . Awaiting call back.  Expected Discharge Date:                  Expected Discharge Plan:  Long Term Acute Care (LTAC)  In-House Referral:     Discharge planning Services  CM Consult  Post Acute Care Choice:    Choice offered to:     DME Arranged:    DME Agency:  [place]:  RN [facility] Agency:  [place]  Status of Service:     Medicare Important Message Given:  Yes-fourth notification given Date Medicare IM Given:    Medicare IM give by:    Date Additional Medicare IM Given:    Additional Medicare Important Message give by:     If discussed at [place] of Stay Meetings, dates discussed:    Additional Comments:  Marilu Favre, RN 05/12/2015, 3:29 PM

## 2015-05-13 LAB — GLUCOSE, CAPILLARY
GLUCOSE-CAPILLARY: 99 mg/dL (ref 65–99)
GLUCOSE-CAPILLARY: 118 mg/dL — AB (ref 65–99)
Glucose-Capillary: 183 mg/dL — ABNORMAL HIGH (ref 65–99)
Glucose-Capillary: 135 mg/dL — ABNORMAL HIGH (ref 65–99)

## 2015-05-13 MED ORDER — TRACE MINERALS CR-CU-MN-SE-ZN 10-1000-500-60 MCG/ML IV SOLN
INTRAVENOUS | Status: AC
  Administered 2015-05-13: 18:00:00 via INTRAVENOUS
  Filled 2015-05-13: qty 1800

## 2015-05-13 MED ORDER — FAT EMULSION 20 % IV EMUL
240.0000 mL | INTRAVENOUS | Status: AC
  Administered 2015-05-13: 240 mL via INTRAVENOUS
  Filled 2015-05-13: qty 250

## 2015-05-13 NOTE — Progress Notes (Signed)
Pt continuing to refuse cpap, RT informed pt to call for RT if she changes her mind during the night

## 2015-05-13 NOTE — Progress Notes (Addendum)
PARENTERAL NUTRITION CONSULT NOTE - FOLLOW UP  Pharmacy Consult for TPN Indication: EC fistula  No Known Allergies  Patient Measurements: Height: 5\' 4"  (162.6 cm) Weight: 218 lb 14.7 oz (99.3 kg) IBW/kg (Calculated) : 54.7  Weight 99.3 kg  Vital Signs: Temp: 98.6 F (37 C) (08/31 0448) BP: 119/57 mmHg (08/31 0448) Pulse Rate: 76 (08/31 0448) Intake/Output from previous day: 08/30 0701 - 08/31 0700 In: 2082.5 [I.V.:10; TPN:2072.5] Out: 2620 [Urine:2050; Drains:570] Intake/Output from this shift: Total I/O In: -  Out: 400 [Urine:400] Labs:  Recent Labs  05/11/15 0600  WBC 8.0  HGB 7.8*  HCT 24.5*  PLT 182    Recent Labs  05/11/15 0600 05/12/15 0603  NA 135 131*  K 4.5 4.7  CL 111 106  CO2 20* 20*  GLUCOSE 109* 126*  BUN 48* 42*  CREATININE 1.31* 1.23*  CALCIUM 8.5* 8.5*  MG 1.9  --   PHOS 4.1  --   PROT 5.4*  --   ALBUMIN 2.2*  --   AST 23  --   ALT 47  --   ALKPHOS 77  --   BILITOT 0.3  --   PREALBUMIN 26.5  --   TRIG 107  --    Estimated Creatinine Clearance: 45.9 mL/min (by C-G formula based on Cr of 1.23).   Recent Labs  05/12/15 2007 05/12/15 2236 05/13/15 0637  GLUCAP 164* 172* 99   Medications:    Insulin Requirements in the past 24 hours:  6 units of KXF/81W (6 units on cyclic TPN)  Nutritional Goals: per RD on 8/26 Protein: 100-110 g  Kcal: 1700-1900  Fluid: 1.7-1.9 L  Current Nutrition:  -Chronic TPN per [place] kcal, 29H protein   -Cyclic Clinimix E 3/71: 62ml/hr x 1 hour, 165 ml/hr x 10 hours, 50 ml/hr x 1 hour. 20% IVFE at 20 ml/hr x 12 hours==This will provide 1759 kcal and 90g protein. Boost TID ordered (NPO)  NPO  Assessment: Continue chronic home TPN. Extensive h/o abdominal surgeries  Surgeries/Procedures:  8/16: s/p abdominal wound exploration with removal of infected abdominal mesh  GI: Abdominal wound with EC fistula with infected abdominal mesh . Drain in place with 1224ml>>850>570 output.  Sandostatin added on 8/22 to help with closure and increased on 8/24. If unable to help close, may need to address surgical interventions to close fistula. No N/V or abdominal pain. Does have some flatus and last BM was 8/22.Continue to control drainage in hopes that fistula will spontaneously close.  IV PPI for h/o GERD, po B12. Prealbumin 26.5  Endo: h/o gout: left ankle pain/acute gouty flare s/p prednisone taper,  DM: CBGs well controlled requiring minimal SSI (164, 172 while TPN infusing) Lytes: Bicarb 20 (Gap 5) on NaBicarb tablet Renal: CKD3, SCr 1.23 down, UOP 0.9 ml/kg/hr Pulm: 100% RA, OSA Cards: h/o HTN, HF, HLD, CAD,  BP 119/57, HR 76 - metoprolol (Norvasc held) Hepatobil: LFTs WNL. TG 45>107, Tbili 0.3. Prealbumin 26.5.Alumin 2.6>2.2, on octreotide Neuro: A&O ID: Afebrile, WBC WNL, no abx Anticoag: h/o DVT-not on anticoagulation secondary to recurrent diverticular and gastric bleeding and iron deficiency anemia (po iron). Lovenox 40mg /d. Hgb down to 7.8 with chronic anemia Best Practices: PPI IV, CHX, Bactroban, LMWH  TPN Access: PICC TPN start date: chronic  Plan:  - Continue cyclic Clinimix E 6/96 - Total volume of 1800 mL over 12 hours== 45ml/hr x 1 hour, 165 ml/hr x 10 hours, 41ml/hr x 1 hour.  - Continue cyclic 78%  IVFE at 20 ml/hr x 12 hours. - TPN + IVFE will provide 1758 kcal (100% of needs) and 90 g of protein (90% of needs)  - Continue MVI and trace elements in TPN - Continue moderate SSI - Nutrition labs due tomorrow.   Dayanna Pryce S. Alford Highland, PharmD, [facility] Clinical Staff Pharmacist Pager [phone]   05/13/2015 10:06 AM

## 2015-05-13 NOTE — Consult Note (Addendum)
WOC follow-up:  Small Eakin pouch has been intact since Sunday; changed this AM. Mod amt green drainage in the cannister from suction catheter. Wound area 100% beefy red with one visible stitch, has decreased in size; 6.5X3.5X5cm. Scar tissue below wound is intact at this time. Pt has a significant valley to this location which occurs when she sits forward. Fistula location at 1:00 o'clock in the wound with a narrow tunneling area. [employer] pouch with barrier ring to maintain seal and adhere around valley of wound with low wall suction, attached to bag with a suction catheter.Supplies at bedside and instructions provided for staff nurses if leakage occurs; they can re-apply Eakin pouching sytem PRN. Julien Girt MSN, RN, CWOCN, CWCN-AP, CNS

## 2015-05-13 NOTE — Progress Notes (Signed)
[facility] Progress Note  15 Days Post-Op  Subjective: Pt doing well.  No N/V or much pain.  NPO.  WOC changing eakins pouch today.  Flatus, but no BM's.    Objective: Vital signs in last 24 hours: Temp:  [98.3 F (36.8 C)-98.6 F (37 C)] 98.6 F (37 C) (08/31 0448) Pulse Rate:  [74-78] 76 (08/31 0448) Resp:  [17-18] 17 (08/31 0448) BP: (119-120)/(57-63) 119/57 mmHg (08/31 0448) SpO2:  [97 %-100 %] 100 % (08/31 0448) Last BM Date: 05/04/15  Intake/Output from previous day: 08/30 0701 - 08/31 0700 In: 2082.5 [I.V.:10; TPN:2072.5] Out: 2620 [Urine:2050; Drains:570] Intake/Output this shift:    PE: Gen:  Alert, NAD, pleasant Abd: Soft, ND, Non-tender on wound, open visible wound with bilious drainage (545mL output), +BS, no HSM, good granulation tissue forming   8/31 - Wound is significantly smaller.  Single fistula site at superior aspect of wound draining bilious fluid.  Good granulation tissue.   Lab Results:   Recent Labs  05/11/15 0600  WBC 8.0  HGB 7.8*  HCT 24.5*  PLT 182   BMET  Recent Labs  05/11/15 0600 05/12/15 0603  NA 135 131*  K 4.5 4.7  CL 111 106  CO2 20* 20*  GLUCOSE 109* 126*  BUN 48* 42*  CREATININE 1.31* 1.23*  CALCIUM 8.5* 8.5*   PT/INR No results for input(s): LABPROT, INR in the last 72 hours. CMP     Component Value Date/Time   NA 131* 05/12/2015 0603   K 4.7 05/12/2015 0603   CL 106 05/12/2015 0603   CO2 20* 05/12/2015 0603   GLUCOSE 126* 05/12/2015 0603   BUN 42* 05/12/2015 0603   CREATININE 1.23* 05/12/2015 0603   CREATININE 1.64* 06/27/2014 1647   CALCIUM 8.5* 05/12/2015 0603   PROT 5.4* 05/11/2015 0600   ALBUMIN 2.2* 05/11/2015 0600   AST 23 05/11/2015 0600   ALT 47 05/11/2015 0600   ALKPHOS 77 05/11/2015 0600   BILITOT 0.3 05/11/2015 0600   GFRNONAA 42* 05/12/2015 0603   GFRAA 49* 05/12/2015 0603   Lipase     Component Value Date/Time   LIPASE 27 12/26/2014 1815        Studies/Results: No results found.  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 Marlou Starks and Dr. Georgette Dover discussing, but may not be for several months to see if the fistula will heal on its own. -CT scan 05/06/15 showed mid small bowel EC fistula draining into the lower margin of the abdominal wall defect.  Gout flare (left ankle) - resolved CKD Stage 3 - Cr 1.23 Disp - Appreciate medicine service. Continue inpatient today, looking into LTAC, may take more  than month for the fistula to heal and she needs suction for the wound.       LOS: 16 days    [name] 05/13/2015, 9:21 AM Pager: [phone]

## 2015-05-13 NOTE — Progress Notes (Addendum)
Nutrition Follow-up  DOCUMENTATION CODES:   Obesity unspecified  INTERVENTION:   -TPN management per pharmacy -RD will follow for diet advancement and supplement diet as appropriate  NUTRITION DIAGNOSIS:   Inadequate oral intake related to inability to eat as evidenced by NPO status.  Ongoing  GOAL:   Patient will meet greater than or equal to 90% of their needs  Met with TPN  MONITOR:   Diet advancement, PO intake, Labs, Weight trends, Skin, I & O's  REASON FOR ASSESSMENT:   Malnutrition Screening Tool, Consult New TPN/TNA  ASSESSMENT:   The patient is a 73 year old female presenting to discuss diagnostic procedure results. The patient is a 74 year old female who presents with a subcutaneous abscess and a very complex past surgical history.   S/p Procedure 04/29/17:  Abdominal wound exploration/ explantation of infected abdominal mesh  Reviewed pharmacy note form 05/08/15. Cyclic TPN regimen continues to infuse via PICC: Clinimix E 5/15 - Total volume of 1800 mL over 12 hours== 77ml/hr x 1 hour, 165 ml/hr x 10 hours, 34ml/hr x 1 hour. Cyclic 83% IVFE at 20 ml/hr x 12 hours. TPN + IVFE will provide 1758 kcal (100% of needs) and 90 g of protein (90% of needs).   Reviewed [facility]RN note from 05/13/15; small eakin pouch with good seal has been intact since 05/10/15. Continues to be connected to drainage to low intermittent suction. Noted 100 ml output this shift.   Per RNCM, awaiting response on LTAC referrals.   Labs reviewed: Na: 130 (on IV supplementation). K WDL.   Diet Order:  Diet NPO time specified Except for: Ice Chips, Sips with Meds  Skin:  Wound (see comment) (closed abdominal incision with wound vac)  Last BM:  05/04/15  Height:   Ht Readings from Last 1 Encounters:  05/01/15 $RemoveB'5\' 4"'hBjIRUjj$  (1.626 m)    Weight:   Wt Readings from Last 1 Encounters:  05/01/15 218 lb 14.7 oz (99.3 kg)    Ideal Body Weight:  54.5 kg  BMI:  Body mass index is 37.56  kg/(m^2).  Estimated Nutritional Needs:   Kcal:  1700-1900  Protein:  100-110 grams  Fluid:  1.7-1.9 L  EDUCATION NEEDS:   No education needs identified at this time  Abundio Teuscher A. Jimmye Norman, RD, LDN, CDE Pager: [phone] After hours Pager: [phone]

## 2015-05-13 NOTE — Care Management Note (Signed)
Case Management Note  Patient Details  Name: Marissa [name] MRN: [id number] Date of Birth: [birth year]  Subjective/Objective:                    Action/Plan:  LTAC referrals made . Explained to patient . [facility] unable to accept patient .  Patient agreed to speak to Seth Bake with [facility] .  Seth Bake will call patient . Will follow up after conversation. Expected Discharge Date:                  Expected Discharge Plan:  Long Term Acute Care (LTAC)  In-House Referral:     Discharge planning Services  CM Consult  Post Acute Care Choice:    Choice offered to:     DME Arranged:    DME Agency:  [place]:  RN [facility] Agency:  [place]  Status of Service:     Medicare Important Message Given:  Yes-fourth notification given Date Medicare IM Given:    Medicare IM give by:    Date Additional Medicare IM Given:    Additional Medicare Important Message give by:     If discussed at [place] of Stay Meetings, dates discussed:    Additional Comments:  Marilu Favre, RN 05/13/2015, 2:57 PM

## 2015-05-14 ENCOUNTER — Ambulatory Visit: Payer: Self-pay | Admitting: Surgery

## 2015-05-14 LAB — COMPREHENSIVE METABOLIC PANEL
ALT: 38 U/L (ref 14–54)
AST: 45 U/L — AB (ref 15–41)
Albumin: 2.5 g/dL — ABNORMAL LOW (ref 3.5–5.0)
Alkaline Phosphatase: 89 U/L (ref 38–126)
Anion gap: 4 — ABNORMAL LOW (ref 5–15)
BILIRUBIN TOTAL: 0.9 mg/dL (ref 0.3–1.2)
BUN: 44 mg/dL — AB (ref 6–20)
CHLORIDE: 108 mmol/L (ref 101–111)
CO2: 23 mmol/L (ref 22–32)
Calcium: 8.9 mg/dL (ref 8.9–10.3)
Creatinine, Ser: 1.29 mg/dL — ABNORMAL HIGH (ref 0.44–1.00)
GFR, EST AFRICAN AMERICAN: 46 mL/min — AB (ref >60)
GFR, EST NON AFRICAN AMERICAN: 40 mL/min — AB (ref >60)
Glucose, Bld: 118 mg/dL — ABNORMAL HIGH (ref 65–99)
POTASSIUM: 5 mmol/L (ref 3.5–5.1)
Sodium: 135 mmol/L (ref 135–145)
TOTAL PROTEIN: 6.5 g/dL (ref 6.5–8.1)

## 2015-05-14 LAB — GLUCOSE, CAPILLARY
GLUCOSE-CAPILLARY: 91 mg/dL (ref 65–99)
GLUCOSE-CAPILLARY: 163 mg/dL — AB (ref 65–99)
Glucose-Capillary: 134 mg/dL — ABNORMAL HIGH (ref 65–99)

## 2015-05-14 LAB — PHOSPHORUS: PHOSPHORUS: 3.7 mg/dL (ref 2.5–4.6)

## 2015-05-14 LAB — MAGNESIUM: MAGNESIUM: 1.9 mg/dL (ref 1.7–2.4)

## 2015-05-14 MED ORDER — TRACE MINERALS CR-CU-MN-SE-ZN 10-1000-500-60 MCG/ML IV SOLN
INTRAVENOUS | Status: AC
  Administered 2015-05-14: 18:00:00 via INTRAVENOUS
  Filled 2015-05-14 (×2): qty 1800

## 2015-05-14 MED ORDER — OCTREOTIDE ACETATE 100 MCG/ML IJ SOLN
300.0000 ug | Freq: Three times a day (TID) | INTRAMUSCULAR | Status: DC
  Administered 2015-05-14 – 2015-05-18 (×12): 300 ug via SUBCUTANEOUS
  Filled 2015-05-14 (×16): qty 3

## 2015-05-14 MED ORDER — FAT EMULSION 20 % IV EMUL
240.0000 mL | INTRAVENOUS | Status: AC
  Administered 2015-05-14: 240 mL via INTRAVENOUS
  Filled 2015-05-14: qty 250

## 2015-05-14 NOTE — Progress Notes (Signed)
[facility] Progress Note  16 Days Post-Op  Subjective: Pt doing okay, no N/V or abdominal pain. NPO.  Output has increased a lot over night.  Ambulates OOB.  No complaints, but doesn't like the shots in her arm.  Objective: Vital signs in last 24 hours: Temp:  [98.1 F (36.7 C)-98.6 F (37 C)] 98.6 F (37 C) (09/01 0457) Pulse Rate:  [70-79] 79 (09/01 0457) Resp:  [16-18] 16 (09/01 0457) BP: (112-126)/(55-60) 119/59 mmHg (09/01 0457) SpO2:  [100 %] 100 % (09/01 0457) Last BM Date: 05/04/15  Intake/Output from previous day: 08/31 0701 - 09/01 0700 In: 1995.1 [I.V.:20; TPN:1975.1] Out: 1830 [Urine:400; Drains:1430] Intake/Output this shift:    PE: Gen:  Alert, NAD, pleasant Abd: Soft, ND, Non-tender on wound, open visible wound with bilious drainage (1421mL output), +BS, no HSM, good granulation tissue forming  05/13/15 wound check   Lab Results:  No results for input(s): WBC, HGB, HCT, PLT in the last 72 hours. BMET  Recent Labs  05/12/15 0603 05/14/15 0600  NA 131* 135  K 4.7 5.0  CL 106 108  CO2 20* 23  GLUCOSE 126* 118*  BUN 42* 44*  CREATININE 1.23* 1.29*  CALCIUM 8.5* 8.9   PT/INR No results for input(s): LABPROT, INR in the last 72 hours. CMP     Component Value Date/Time   NA 135 05/14/2015 0600   K 5.0 05/14/2015 0600   CL 108 05/14/2015 0600   CO2 23 05/14/2015 0600   GLUCOSE 118* 05/14/2015 0600   BUN 44* 05/14/2015 0600   CREATININE 1.29* 05/14/2015 0600   CREATININE 1.64* 06/27/2014 1647   CALCIUM 8.9 05/14/2015 0600   PROT 6.5 05/14/2015 0600   ALBUMIN 2.5* 05/14/2015 0600   AST 45* 05/14/2015 0600   ALT 38 05/14/2015 0600   ALKPHOS 89 05/14/2015 0600   BILITOT 0.9 05/14/2015 0600   GFRNONAA 40* 05/14/2015 0600   GFRAA 46* 05/14/2015 0600   Lipase     Component Value Date/Time   LIPASE 27 12/26/2014 1815       Studies/Results: No results found.  Anti-infectives: Anti-infectives    Start     Dose/Rate Route  Frequency Ordered Stop   04/28/15 0000  cefOXitin (MEFOXIN) 1 g in dextrose 5 % 50 mL IVPB     1 g 100 mL/hr over 30 Minutes Intravenous  Once 04/27/15 1157 04/28/15 0859       Assessment/Plan POD #16 s/p Abdominal wound exploration/explanation of infected abdominal mesh, placement of penrose drain -Afebrile, normal WBC -Cyclical TPN -Penrose out already -WOC following, Eakins pouch on with suction to it, back up to 148mL drainage in canister currently bilious, wound is healing in well -Hoping fistula will close on its own, cont NPO, continue TPN and increase Sandostatin to 332mcg q8h TODAY, may need to titrate up to 500mg  q8hr -Ambulate and IS -SCD's and lovenox -May need to re-address surgical interventions, Dr. Marlou Starks and Dr. Georgette Dover discussing, but may not be for several months to see if the fistula will heal on its own. -CT scan 05/06/15 showed mid small bowel EC fistula draining into the lower margin of the abdominal wall defect.  Gout flare (left ankle) - resolved CKD Stage 3 - Cr 1.23 Disp - Appreciate medicine service - they have signed off. Continue inpatient today, looking into LTAC, may take more than month for the fistula to heal and she needs suction for the wound.     LOS: 17 days    Cambree Hendrix  Darnelle Spangle 05/14/2015, 9:14 AM Pager: [phone]

## 2015-05-14 NOTE — Consult Note (Signed)
WOC follow-up: Eakin pouch intact with good seal since yesterday to abd fistula wound. Large amt green drainage in suction cannister, which remain to low wall suction. Supplies in room and instructions are available for bedside nurses if leakage occurs. Julien Girt MSN, RN, [place], [place], [place]

## 2015-05-14 NOTE — Care Management Important Message (Signed)
Important Message  Patient Details  Name: Marissa [name] MRN: [id number] Date of Birth: [birth year]   Medicare Important Message Given:  Yes-fourth notification given    Delorse Lek 05/14/2015, 12:22 PM

## 2015-05-14 NOTE — Care Management Note (Signed)
Case Management Note  Patient Details  Name: Marissa [name] MRN: [id number] Date of Birth: April 19, 1941  Subjective/Objective:                    Action/Plan:   Expected Discharge Date:                  Expected Discharge Plan:  Long Term Acute Care (LTAC)  In-House Referral:     Discharge planning Services  CM Consult  Post Acute Care Choice:    Choice offered to:     DME Arranged:    DME Agency:  [place]:    [facility] Agency:     Status of Service:  In process, will continue to follow  Medicare Important Message Given:  Yes-fourth notification given Date Medicare IM Given:    Medicare IM give by:    Date Additional Medicare IM Given:    Additional Medicare Important Message give by:     If discussed at [place] of Stay Meetings, dates discussed:  05-14-15   Additional Comments:  Marilu Favre, RN 05/14/2015, 3:02 PM

## 2015-05-14 NOTE — Progress Notes (Signed)
PARENTERAL NUTRITION CONSULT NOTE - FOLLOW UP  Pharmacy Consult for TPN Indication: EC fistula  No Known Allergies  Patient Measurements: Height: 5\' 4"  (162.6 cm) Weight: 218 lb 14.7 oz (99.3 kg) IBW/kg (Calculated) : 54.7  Weight 99.3 kg  Vital Signs: Temp: 98.6 F (37 C) (09/01 0457) BP: 119/59 mmHg (09/01 0457) Pulse Rate: 79 (09/01 0457) Intake/Output from previous day: 08/31 0701 - 09/01 0700 In: 1995.1 [I.V.:20; TPN:1975.1] Out: 5284 [Urine:400; Drains:1430] Intake/Output from this shift:   Labs: No results for input(s): WBC, HGB, HCT, PLT, APTT, INR in the last 72 hours.  Recent Labs  05/12/15 0603 05/14/15 0600  NA 131* 135  K 4.7 5.0  CL 106 108  CO2 20* 23  GLUCOSE 126* 118*  BUN 42* 44*  CREATININE 1.23* 1.29*  CALCIUM 8.5* 8.9  MG  --  1.9  PHOS  --  3.7  PROT  --  6.5  ALBUMIN  --  2.5*  AST  --  45*  ALT  --  38  ALKPHOS  --  89  BILITOT  --  0.9   Estimated Creatinine Clearance: 43.8 mL/min (by C-G formula based on Cr of 1.29).   Recent Labs  05/13/15 2014 05/13/15 2305 05/14/15 0658  GLUCAP 183* 135* 91     Insulin Requirements in the past 24 hours:  5 units of XLK/44W (during cyclic TPN)  Nutritional Goals: per RD on 8/26 Protein: 100-110 g  Kcal: 1700-1900  Fluid: 1.7-1.9 L  Current Nutrition:  -Chronic TPN per [place] kcal, 10U protein   -Cyclic Clinimix E 7/25: 2ml/hr x 1 hour, 165 ml/hr x 10 hour NPO  Assessment: Continue chronic home TPN. Extensive h/o abdominal surgeries  Surgeries/Procedures:  8/16: s/p abdominal wound exploration with removal of infected abdominal mesh  GI: Abdominal wound with EC fistula with infected abdominal mesh . Drain in place with 1240ml>>850>570>1430ml output. Sandostatin added on 8/22 to help with closure and increased on 8/24 and increased again today 9/1 to 300 mcg q8h. If unable to help close, may need to address surgical interventions to close fistula. No N/V or  abdominal pain. Does have some flatus and last BM was 8/22.Continue to control drainage in hopes that fistula will spontaneously close.  IV PPI for h/o GERD, po B12. Prealbumin 26.5  Endo: h/o gout: left ankle pain/acute gouty flare s/p prednisone taper,  DM: CBGs well controlled requiring minimal SSI Lytes: Bicarb 20>23 on NaBicarb tablet, k 5, hemolyzed, mag 1.9, phos 3.7 Renal: CKD3, SCr 1.29 Pulm: 100% RA, OSA Cards: h/o HTN, HF, HLD, CAD,  metoprolol (Norvasc held) Hepatobil: AST 45; . TG 45>107, Tbili 0.9. Prealbumin 26.5.Alumin 2.6>2.2, on octreotide Neuro: A&O ID: Afebrile, WBC WNL, no abx Anticoag: h/o DVT-not on anticoagulation secondary to recurrent diverticular and gastric bleeding and iron deficiency anemia (po iron). Lovenox 40mg /d. Hgb down to 7.8 with chronic anemia Best Practices: PPI IV, CHX, Bactroban, LMWH  TPN Access: PICC TPN start date: chronic  Plan:  - Continue cyclic Clinimix E 3/66 - Total volume of 1800 mL over 12 hours== 85ml/hr x 1 hour, 165 ml/hr x 10 hours, 76ml/hr x 1 hour.  - Continue cyclic 44% IVFE at 20 ml/hr x 12 hours. - TPN + IVFE will provide 1758 kcal (100% of needs) and 90 g of protein (90% of needs)  - Continue MVI and trace elements in TPN - Continue moderate SSI - Nutrition labs M/Th  Eudelia Bunch, Pharm.D. [phone] 05/14/2015 10:30  AM

## 2015-05-14 NOTE — Progress Notes (Signed)
Patient refused cpap for tonight. RT will continue to monitor.

## 2015-05-14 NOTE — Clinical Social Work Note (Signed)
CSW received consult for SNF placement.  Patient has [employer]gallery [employer] with continous suction and TPN, [employer], [employer], [employer], and [facility] [facility] can not accept patient with [employer] with continous suction.  Patient has been referred to [facility], CSW to sign off for now, please reconsult if other social work needs arise.  Jones Broom. [place], MSW, [place] 05/14/2015 12:10 PM

## 2015-05-15 LAB — GLUCOSE, CAPILLARY
GLUCOSE-CAPILLARY: 125 mg/dL — AB (ref 65–99)
GLUCOSE-CAPILLARY: 174 mg/dL — AB (ref 65–99)
Glucose-Capillary: 127 mg/dL — ABNORMAL HIGH (ref 65–99)
Glucose-Capillary: 86 mg/dL (ref 65–99)

## 2015-05-15 MED ORDER — OXYCODONE HCL 5 MG PO TABS: 5.0000 mg | ORAL_TABLET | Freq: Four times a day (QID) | ORAL | Status: DC | PRN

## 2015-05-15 MED ORDER — FAT EMULSION 20 % IV EMUL
240.0000 mL | INTRAVENOUS | Status: AC
  Administered 2015-05-15: 240 mL via INTRAVENOUS
  Filled 2015-05-15: qty 250

## 2015-05-15 MED ORDER — OCTREOTIDE ACETATE 100 MCG/ML IJ SOLN: 300.0000 ug | Freq: Three times a day (TID) | INTRAMUSCULAR | Status: DC

## 2015-05-15 MED ORDER — METHOCARBAMOL 500 MG PO TABS
500.0000 mg | ORAL_TABLET | Freq: Three times a day (TID) | ORAL | Status: DC | PRN
  Administered 2015-05-18: 500 mg via ORAL
  Filled 2015-05-15: qty 1

## 2015-05-15 MED ORDER — MAGNESIUM SULFATE IN D5W 10-5 MG/ML-% IV SOLN
1.0000 g | Freq: Once | INTRAVENOUS | Status: AC
  Administered 2015-05-15: 1 g via INTRAVENOUS
  Filled 2015-05-15: qty 100

## 2015-05-15 MED ORDER — TRACE MINERALS CR-CU-MN-SE-ZN 10-1000-500-60 MCG/ML IV SOLN
INTRAVENOUS | Status: AC
  Administered 2015-05-15: 18:00:00 via INTRAVENOUS
  Filled 2015-05-15 (×2): qty 1800

## 2015-05-15 MED ORDER — METHOCARBAMOL 500 MG PO TABS: 500.0000 mg | ORAL_TABLET | Freq: Three times a day (TID) | ORAL | Status: DC | PRN

## 2015-05-15 NOTE — Care Management Note (Addendum)
Case Management Note  Patient Details  Name: Marissa [name] MRN: [id number] Date of Birth: 1940-10-18  Subjective/Objective:                    Action/Plan:  Teresa  [phone], Case Manager at [facility] reviewing patient clinicals for [place] . [facility] is out of network . [facility] denied patient due to patient not having "correct Medicare codes" per Santiago Glad with [facility] . Helene Kelp with [facility] aware and also aware SNF's unable to take Eakins Pouch  to continuous suction.    Expected Discharge Date:                  Expected Discharge Plan:  Long Term Acute Care (LTAC)  In-House Referral:     Discharge planning Services  CM Consult  Post Acute Care Choice:    Choice offered to:     DME Arranged:    DME Agency:  [place]:    [facility] Agency:     Status of Service:  In process, will continue to follow  Medicare Important Message Given:  Yes-fourth notification given Date Medicare IM Given:    Medicare IM give by:    Date Additional Medicare IM Given:    Additional Medicare Important Message give by:     If discussed at [place] of Stay Meetings, dates discussed:    Additional Comments:  Marilu Favre, RN 05/15/2015, 7:34 AM

## 2015-05-15 NOTE — Consult Note (Signed)
Small Eakin pouch has been intact since Wed; changed this AM. Mod amt green drainage in the cannister from suction catheter. Wound area 100% beefy red with one visible stitch. Scar tissue below wound is intact at this time. Pt has a significant valley to this location which occurs when she sits forward. Fistula location at 1:00 o'clock in the wound with a narrow tunneling area. [employer] pouch with barrier ring to maintain seal and adhere around valley of wound with low wall suction, attached to bag with a suction catheter.Supplies at bedside and instructions provided for staff nurses if leakage occurs; they can re-apply Eakin pouching sytem PRN. Julien Girt MSN, RN, CWOCN, CWCN-AP, CNS

## 2015-05-15 NOTE — Progress Notes (Signed)
PARENTERAL NUTRITION CONSULT NOTE - FOLLOW UP  Pharmacy Consult for TPN Indication: EC fistula  No Known Allergies  Patient Measurements: Height: 5\' 4"  (162.6 cm) Weight: 218 lb 14.7 oz (99.3 kg) IBW/kg (Calculated) : 54.7  Weight 99.3 kg  Vital Signs: Temp: 98.9 F (37.2 C) (09/02 0600) Temp Source: Oral (09/02 0600) BP: 113/64 mmHg (09/02 0600) Pulse Rate: 85 (09/02 0600) Intake/Output from previous day: 09/01 0701 - 09/02 0700 In: 120 [TPN:120] Out: 1500 [Urine:1300; Drains:200] Intake/Output from this shift:   Labs: No results for input(s): WBC, HGB, HCT, PLT, APTT, INR in the last 72 hours.  Recent Labs  05/14/15 0600  NA 135  K 5.0  CL 108  CO2 23  GLUCOSE 118*  BUN 44*  CREATININE 1.29*  CALCIUM 8.9  MG 1.9  PHOS 3.7  PROT 6.5  ALBUMIN 2.5*  AST 45*  ALT 38  ALKPHOS 89  BILITOT 0.9   Estimated Creatinine Clearance: 43.8 mL/min (by C-G formula based on Cr of 1.29).   Recent Labs  05/14/15 0658 05/14/15 1221 05/14/15 2118  GLUCAP 91 134* 163*     Insulin Requirements while Cyclic TPN infusing: 3 units of SSI  Nutritional Goals: per RD on 9/1 Protein: 100-110 g  Kcal: 1700-1900  Fluid: 1.7-1.9 L  Current Nutrition:  NPO Current Cyclic Clinimix E 2/58: 17ml/hr x 1 hour, 165 ml/hr x 10 hour, 50 ml/hr x 1 + 20% IVFE at 20 ml/hr x 12h provides 1758 kcal and 90 grams protein (meeting 100% kcal and 90% protein goals) (Chronic TPN PTA per [place] kcal, 85g protein)   Assessment: Continue chronic home TPN. 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 Drain output/24h: 1430 cc. Sandostatin added on 8/22 to help with closure and increased on 8/24 & 9/1 - current dose is 300 mcg q8h. If unable to help close, may need to address surgical interventions to close fistula. Surgery  also considering placing NGT to help decrease drainage. No N/V or abdominal pain. Does have some flatus and last BM was 8/22.Continue to control drainage in hopes that fistula will spontaneously close.  IV PPI for h/o GERD, po B12. Prealbumin 26.5 (8/29), alb 2.5  Endo: Hx gout: left ankle pain/acute gouty flare s/p prednisone taper. Hx DM: CBGs well controlled requiring minimal SSI Lytes: From 9/1: Bicarb 23, K 5 (hemolyzed), Mg 1.9, Phos 3.7  Renal: Hx CKD3, SCr 1.29 << 1.23, CrCl~40-50 ml/min, UOP/24h: 0.5 ml/kg/hr.  Pulm: 100% RA, OSA Cards: Hx HTN/HF/DL/CAD. BP/HR wnl. On lopressor (norvasc held) Hepatobil: AST 45 << 23, TG 107 (8/29), Tbili 0.9. Prealbumin 26.5 (8/29), alb 2.5, on octreotide.  Neuro: A&O ID: Afebrile, WBC WNL, no abx Anticoag: h/o DVT-not on anticoagulation secondary to recurrent diverticular and gastric bleeding and iron deficiency anemia (po iron). Lovenox 40mg /d. Hgb down to 7.8 with chronic anemia Best Practices: PPI IV, CHX, Bactroban, LMWH  TPN Access: PICC TPN start date: chronic  Plan:  - Continue cyclic Clinimix E 5/27: Total volume of 1800 mL over 12 hours. 90ml/hr x 1 hour, 165 ml/hr x 10 hours, 83ml/hr x 1 hour.  -  Continue cyclic 78% IVFE at 20 ml/hr x 12 hours. - TPN + IVFE will provide 1758 kcal (100% of needs) and 90 g of protein (90% of needs)  - Continue MVI and trace elements in TPN - Continue  moderate SSI - Mg 1g IV x 1 dose - Will f/u TPN labs  Alycia Rossetti, PharmD, BCPS Clinical Pharmacist Pager: [phone] 05/15/2015 7:46 AM

## 2015-05-15 NOTE — Progress Notes (Signed)
PT Note   Per PT note on 8/29 and OT note 8/30 pt is mobilizing without difficulty and does not have any acute care rehab needs.  Per PT DC summary on 8/26 pt ambulated 800 feet without assistive device.  [place], [place] 05/15/2015

## 2015-05-15 NOTE — Progress Notes (Signed)
Pt refusing cpap for tonight; encouraged her to call if she changed her mind

## 2015-05-15 NOTE — Care Management Note (Signed)
Case Management Note  Patient Details  Name: Marissa [name] MRN: [id number] Date of Birth: 1941/06/24  Subjective/Objective:                    Action/Plan: Helene Kelp from [facility] called, before Medical Director at [place] will consider LTAC , He wanted Helene Kelp to look at more SNF's .   Helene Kelp spoke with [facility] and [facility] both willing to consider patient.   [place] denied patient due to TPN .   Shaun from [facility] coming to assess patient at 1500 today .   Patient aware of above and voices understanding .   Expected Discharge Date:                  Expected Discharge Plan:  Long Term Acute Care (LTAC)  In-House Referral:     Discharge planning Services  CM Consult  Post Acute Care Choice:    Choice offered to:     DME Arranged:    DME Agency:  [place]:    [facility] Agency:     Status of Service:  In process, will continue to follow  Medicare Important Message Given:  Yes-fourth notification given Date Medicare IM Given:    Medicare IM give by:    Date Additional Medicare IM Given:    Additional Medicare Important Message give by:     If discussed at [place] of Stay Meetings, dates discussed:    Additional Comments:  Marilu Favre, RN 05/15/2015, 2:54 PM

## 2015-05-15 NOTE — Care Management (Addendum)
Shaun Arbaugh Clinical Liaison from [facility]  [phone] , reviewed patient , he is extending bed offer , however , not until Tuesday 05-19-15 . Patient aware and in agreement .    Helene Kelp with [facility] aware and will call MR Arbaugh directly .   SW aware .   Saverio Danker PA aware.   Santiago Glad with [facility] also aware.

## 2015-05-15 NOTE — Progress Notes (Signed)
[facility] Progress Note  17 Days Post-Op  Subjective: Pt depressed.  No N/V. C/o abdominal pain where she's getting lovenox shots.  Ambulating some but feels like she's tied to the bed due to suction line.  Mobilizes well.  She says the shots hurt her.  She'd like some heat.  Objective: Vital signs in last 24 hours: Temp:  [97.5 F (36.4 C)-98.9 F (37.2 C)] 98.9 F (37.2 C) (09/02 0600) Pulse Rate:  [70-85] 85 (09/02 0600) Resp:  [12-16] 16 (09/02 0600) BP: (113-130)/(61-90) 113/64 mmHg (09/02 0600) SpO2:  [100 %] 100 % (09/02 0600) Last BM Date: 05/04/15  Intake/Output from previous day: 09/01 0701 - 09/02 0700 In: 120 [TPN:120] Out: 1500 [Urine:1300; Drains:200] Intake/Output this shift:    PE: Gen:  Alert, NAD, depressed Abd: Soft, ND, Non-tender on wound, slight tenderness in the left side of her abdomen, no ecchymosis or induration, open visible wound with bilious drainage (29mL output), +BS, no HSM, good granulation tissue forming  Lab Results:  No results for input(s): WBC, HGB, HCT, PLT in the last 72 hours. BMET  Recent Labs  05/14/15 0600  NA 135  K 5.0  CL 108  CO2 23  GLUCOSE 118*  BUN 44*  CREATININE 1.29*  CALCIUM 8.9   PT/INR No results for input(s): LABPROT, INR in the last 72 hours. CMP     Component Value Date/Time   NA 135 05/14/2015 0600   K 5.0 05/14/2015 0600   CL 108 05/14/2015 0600   CO2 23 05/14/2015 0600   GLUCOSE 118* 05/14/2015 0600   BUN 44* 05/14/2015 0600   CREATININE 1.29* 05/14/2015 0600   CREATININE 1.64* 06/27/2014 1647   CALCIUM 8.9 05/14/2015 0600   PROT 6.5 05/14/2015 0600   ALBUMIN 2.5* 05/14/2015 0600   AST 45* 05/14/2015 0600   ALT 38 05/14/2015 0600   ALKPHOS 89 05/14/2015 0600   BILITOT 0.9 05/14/2015 0600   GFRNONAA 40* 05/14/2015 0600   GFRAA 46* 05/14/2015 0600   Lipase     Component Value Date/Time   LIPASE 27 12/26/2014 1815       Studies/Results: No results  found.  Anti-infectives: Anti-infectives    Start     Dose/Rate Route Frequency Ordered Stop   04/28/15 0000  cefOXitin (MEFOXIN) 1 g in dextrose 5 % 50 mL IVPB     1 g 100 mL/hr over 30 Minutes Intravenous  Once 04/27/15 1157 04/28/15 0859       Assessment/Plan POD #17 s/p Abdominal wound exploration/explanation of infected abdominal mesh, placement of penrose drain -Afebrile, normal WBC -Cyclical TPN -Penrose out already -WOC following, Eakins pouch on with suction to it, down to 237mL drainage in canister currently bilious, wound is healing in well -Hoping fistula will close on its own, cont NPO, continue TPN and Sandostatin to 333mcg q8h, increase to 400 on Saturday, may need to titrate up to 500mg  q8hr max -Ambulate and IS -SCD's and lovenox -May need to re-address surgical interventions, Dr. Marlou Starks and Dr. Georgette Dover discussing, but may not be for several months to see if the fistula will heal on its own. -CT scan 05/06/15 showed mid small bowel EC fistula draining into the lower margin of the abdominal wall defect.  Gout flare (left ankle) - resolved CKD Stage 3 - Cr 1.29 Disp - Appreciate medicine service - they have signed off. Ready for d/c to LTAC when insurance approves, may take more than month for the fistula to heal and she needs suction  for the wound. Follow up arranged on 06/26/15 at 10:40am with Dr. Georgette Dover.    LOS: 18 days    [name] 05/15/2015, 8:29 AM Pager: [phone]

## 2015-05-16 LAB — BASIC METABOLIC PANEL
Anion gap: 5 (ref 5–15)
BUN: 42 mg/dL — AB (ref 6–20)
CALCIUM: 8.8 mg/dL — AB (ref 8.9–10.3)
CO2: 22 mmol/L (ref 22–32)
CREATININE: 1.29 mg/dL — AB (ref 0.44–1.00)
Chloride: 104 mmol/L (ref 101–111)
GFR, EST AFRICAN AMERICAN: 46 mL/min — AB (ref >60)
GFR, EST NON AFRICAN AMERICAN: 40 mL/min — AB (ref >60)
Glucose, Bld: 146 mg/dL — ABNORMAL HIGH (ref 65–99)
Potassium: 4.6 mmol/L (ref 3.5–5.1)
SODIUM: 131 mmol/L — AB (ref 135–145)

## 2015-05-16 LAB — GLUCOSE, CAPILLARY
GLUCOSE-CAPILLARY: 174 mg/dL — AB (ref 65–99)
GLUCOSE-CAPILLARY: 101 mg/dL — AB (ref 65–99)
GLUCOSE-CAPILLARY: 123 mg/dL — AB (ref 65–99)
GLUCOSE-CAPILLARY: 164 mg/dL — AB (ref 65–99)
Glucose-Capillary: 148 mg/dL — ABNORMAL HIGH (ref 65–99)
Glucose-Capillary: 192 mg/dL — ABNORMAL HIGH (ref 65–99)
Glucose-Capillary: 84 mg/dL (ref 65–99)

## 2015-05-16 LAB — MAGNESIUM: MAGNESIUM: 1.9 mg/dL (ref 1.7–2.4)

## 2015-05-16 MED ORDER — FAT EMULSION 20 % IV EMUL
240.0000 mL | INTRAVENOUS | Status: AC
  Administered 2015-05-16: 240 mL via INTRAVENOUS
  Filled 2015-05-16: qty 250

## 2015-05-16 MED ORDER — MAGNESIUM SULFATE IN D5W 10-5 MG/ML-% IV SOLN
1.0000 g | Freq: Once | INTRAVENOUS | Status: AC
  Administered 2015-05-16: 1 g via INTRAVENOUS
  Filled 2015-05-16: qty 100

## 2015-05-16 MED ORDER — M.V.I. ADULT IV INJ
INJECTION | INTRAVENOUS | Status: AC
  Administered 2015-05-16: 18:00:00 via INTRAVENOUS
  Filled 2015-05-16 (×2): qty 1800

## 2015-05-16 NOTE — Progress Notes (Signed)
PARENTERAL NUTRITION CONSULT NOTE - FOLLOW UP  Pharmacy Consult for TPN Indication: EC fistula  No Known Allergies  Patient Measurements: Height: 5\' 4"  (162.6 cm) Weight: 218 lb 14.7 oz (99.3 kg) IBW/kg (Calculated) : 54.7  Weight 99.3 kg  Vital Signs: Temp: 98.4 F (36.9 C) (09/03 0503) Temp Source: Oral (09/03 0503) BP: 131/56 mmHg (09/03 0503) Pulse Rate: 89 (09/03 0503) Intake/Output from previous day: 09/02 0701 - 09/03 0700 In: 0  Out: 1200 [Urine:600; Drains:600] Intake/Output from this shift:   Labs: No results for input(s): WBC, HGB, HCT, PLT, APTT, INR in the last 72 hours.  Recent Labs  05/14/15 0600 05/16/15 0607  NA 135 131*  K 5.0 4.6  CL 108 104  CO2 23 22  GLUCOSE 118* 146*  BUN 44* 42*  CREATININE 1.29* 1.29*  CALCIUM 8.9 8.8*  MG 1.9 1.9  PHOS 3.7  --   PROT 6.5  --   ALBUMIN 2.5*  --   AST 45*  --   ALT 38  --   ALKPHOS 89  --   BILITOT 0.9  --    Estimated Creatinine Clearance: 43.8 mL/min (by C-G formula based on Cr of 1.29).   Recent Labs  05/15/15 1942 05/15/15 2354 05/16/15 0458  GLUCAP 174* 148* 174*     Insulin Requirements while Cyclic TPN infusing: 5 units of SSI  Nutritional Goals: per RD on 9/1 Protein: 100-110 g  Kcal: 1700-1900  Fluid: 1.7-1.9 L  Current Nutrition:  NPO Current Cyclic Clinimix E 1/82: 67ml/hr x 1 hour, 165 ml/hr x 10 hour, 50 ml/hr x 1 + 20% IVFE at 20 ml/hr x 12h provides 1758 kcal and 90 grams protein (meeting 100% kcal and 90% protein goals) (Chronic TPN PTA per [place] kcal, 85g protein)   Assessment: Continue chronic home TPN. 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 Drain output/24h: 1430 cc. Sandostatin added on 8/22 to help with closure and increased on 8/24 & 9/1 - current dose is 300 mcg q8h - likely will  increase again 9/3. If unable to help close, may need to address surgical interventions to close fistula. Surgery also considering placing NGT to help decrease drainage. No N/V or abdominal pain. Does have some flatus and last BM was 8/22.Continue to control drainage in hopes that fistula will spontaneously close. May take a month for fistula to close so planning d/c to SNF once accepted and approved with insurance - appears the patient has been excepted to [facility] - bed will not be available until 9/6.  IV PPI for h/o GERD, po B12. Prealbumin 26.5 (8/29), alb 2.5  Endo: Hx gout: left ankle pain/acute gouty flare s/p prednisone taper. Hx DM: CBGs well controlled requiring minimal SSI Lytes: K 4.6, Mg 1.9, Phos 3.7  Renal: Hx CKD3, SCr 1.29 << 1.23, CrCl~40-50 ml/min, UOP not accurately charted Pulm: 100% RA, OSA Cards: Hx HTN/HF/DL/CAD. BP/HR wnl. On lopressor (norvasc held) Hepatobil: AST 45 << 23, TG 107 (8/29), Tbili 0.9. Prealbumin 26.5 (8/29), alb 2.5, on octreotide.  Neuro: A&O ID: Afebrile, WBC WNL, no abx Anticoag: h/o DVT-not on anticoagulation secondary to recurrent diverticular and gastric bleeding and iron deficiency anemia (po iron). Lovenox 40mg /d. Hgb down to 7.8 with chronic anemia Best Practices: PPI IV, CHX, Bactroban, LMWH  TPN Access: PICC TPN start date: chronic  Plan:  -  Continue cyclic Clinimix E 6/16: Total volume of 1800 mL over 12 hours. 15ml/hr x 1 hour, 165 ml/hr x 10 hours, 30ml/hr x 1 hour.  -  Continue cyclic 07% IVFE at 20 ml/hr x 12 hours. - TPN + IVFE will provide 1758 kcal (100% of needs) and 90 g of protein (90% of needs)  - Continue MVI and trace elements in TPN - Continue moderate SSI - Repeat Mg 1g IV x 1 dose - Will f/u TPN labs  Alycia Rossetti, PharmD, BCPS Clinical Pharmacist Pager: [phone] 05/16/2015 7:20 AM

## 2015-05-16 NOTE — Progress Notes (Signed)
Patient ID: Marissa [name], female   DOB: May 02, 1941, 74 y.o.   MRN: [id number] [facility] Progress Note:   18 Days Post-Op  Subjective: Mental status is alert Objective: Vital signs in last 24 hours: Temp:  [98.4 F (36.9 C)-98.6 F (37 C)] 98.4 F (36.9 C) (09/03 0503) Pulse Rate:  [87-89] 89 (09/03 0503) Resp:  [18-20] 18 (09/03 0503) BP: (109-131)/(56-59) 131/56 mmHg (09/03 0503) SpO2:  [100 %] 100 % (09/03 0503)  Intake/Output from previous day: 09/02 0701 - 09/03 0700 In: 0  Out: 2000 [Urine:600; Drains:1400] Intake/Output this shift:    Physical Exam: Work of breathing is not labored;  Fistula draining green liquid  Lab Results:  Results for orders placed or performed during the hospital encounter of 04/27/15 (from the past 48 hour(s))  Glucose, capillary     Status: Abnormal   Collection Time: 05/14/15 12:21 PM  Result Value Ref Range   Glucose-Capillary 134 (H) 65 - 99 mg/dL  Glucose, capillary     Status: Abnormal   Collection Time: 05/14/15  9:18 PM  Result Value Ref Range   Glucose-Capillary 163 (H) 65 - 99 mg/dL  Glucose, capillary     Status: Abnormal   Collection Time: 05/15/15  8:05 AM  Result Value Ref Range   Glucose-Capillary 127 (H) 65 - 99 mg/dL  Glucose, capillary     Status: Abnormal   Collection Time: 05/15/15 12:19 PM  Result Value Ref Range   Glucose-Capillary 125 (H) 65 - 99 mg/dL   Comment 1 Repeat Test   Glucose, capillary     Status: None   Collection Time: 05/15/15  4:03 PM  Result Value Ref Range   Glucose-Capillary 86 65 - 99 mg/dL   Comment 1 Notify RN   Glucose, capillary     Status: Abnormal   Collection Time: 05/15/15  7:42 PM  Result Value Ref Range   Glucose-Capillary 174 (H) 65 - 99 mg/dL   Comment 1 Notify RN   Glucose, capillary     Status: Abnormal   Collection Time: 05/15/15 11:54 PM  Result Value Ref Range   Glucose-Capillary 148 (H) 65 - 99 mg/dL  Glucose, capillary     Status: Abnormal   Collection  Time: 05/16/15  4:58 AM  Result Value Ref Range   Glucose-Capillary 174 (H) 65 - 99 mg/dL  Basic metabolic panel     Status: Abnormal   Collection Time: 05/16/15  6:07 AM  Result Value Ref Range   Sodium 131 (L) 135 - 145 mmol/L   Potassium 4.6 3.5 - 5.1 mmol/L   Chloride 104 101 - 111 mmol/L   CO2 22 22 - 32 mmol/L   Glucose, Bld 146 (H) 65 - 99 mg/dL   BUN 42 (H) 6 - 20 mg/dL   Creatinine, Ser 9.37 (H) 0.44 - 1.00 mg/dL   Calcium 8.8 (L) 8.9 - 10.3 mg/dL   GFR calc non Af Amer 40 (L) >60 mL/min   GFR calc Af Amer 46 (L) >60 mL/min    Comment: (NOTE) The eGFR has been calculated using the CKD EPI equation. This calculation has not been validated in all clinical situations. eGFR's persistently <60 mL/min signify possible Chronic Kidney Disease.    Anion gap 5 5 - 15  Magnesium     Status: None   Collection Time: 05/16/15  6:07 AM  Result Value Ref Range   Magnesium 1.9 1.7 - 2.4 mg/dL  Glucose, capillary     Status: Abnormal  Collection Time: 05/16/15  8:06 AM  Result Value Ref Range   Glucose-Capillary 101 (H) 65 - 99 mg/dL    Radiology/Results: No results found.  Anti-infectives: Anti-infectives    Start     Dose/Rate Route Frequency Ordered Stop   04/28/15 0000  cefOXitin (MEFOXIN) 1 g in dextrose 5 % 50 mL IVPB     1 g 100 mL/hr over 30 Minutes Intravenous  Once 04/27/15 1157 04/28/15 0859      Assessment/Plan: Problem List: Patient Active Problem List   Diagnosis Date Noted  . Hyponatremia 05/10/2015  . Acute idiopathic gout of left ankle   . Hyperkalemia 05/04/2015  . Type 2 diabetes mellitus without complication   . D-dimer, elevated 04/16/2015  . Precordial chest pain 04/15/2015  . 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 Abdominal wall abscess 03/13/2015  . Non-compliant behavior 01/22/2015  . Anemia, iron deficiency 12/21/2014  . Diverticulosis of colon with hemorrhage 12/20/2014  . Chronic diastolic CHF (congestive heart failure) 06/29/2014  .  Routine general medical examination at a health care facility 05/30/2014  . Dementia arising in the senium and presenium 03/29/2013  . Acute gouty arthritis 02/15/2012  . Other screening mammogram 02/15/2012  . Osteopenia 02/15/2012  . 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 Hyperlipidemia with target LDL less than 100 10/06/2011  . OSA (obstructive sleep apnea) 04/02/2009  . GERD 08/20/2006  . Gout 08/15/2006  . Essential hypertension 08/15/2006  . CAD S/P OM BMS 8/07, ISR-OM DES 8/09 08/15/2006  . Chronic kidney disease, stage 3 08/15/2006  . DVT, HX OF 08/15/2006    Awaiting SNF placement for management of enterocutaneous fistula 18 Days Post-Op    LOS: 19 days   Matt B. Hassell Done, MD, [facility] [phone] beeper [phone]  05/16/2015 9:31 AM

## 2015-05-17 LAB — GLUCOSE, CAPILLARY
GLUCOSE-CAPILLARY: 109 mg/dL — AB (ref 65–99)
GLUCOSE-CAPILLARY: 145 mg/dL — AB (ref 65–99)
GLUCOSE-CAPILLARY: 183 mg/dL — AB (ref 65–99)
GLUCOSE-CAPILLARY: 90 mg/dL (ref 65–99)
Glucose-Capillary: 148 mg/dL — ABNORMAL HIGH (ref 65–99)

## 2015-05-17 MED ORDER — FAT EMULSION 20 % IV EMUL
240.0000 mL | INTRAVENOUS | Status: AC
  Administered 2015-05-17: 240 mL via INTRAVENOUS
  Filled 2015-05-17: qty 250

## 2015-05-17 MED ORDER — TRACE MINERALS CR-CU-MN-SE-ZN 10-1000-500-60 MCG/ML IV SOLN
INTRAVENOUS | Status: AC
  Administered 2015-05-17: 18:00:00 via INTRAVENOUS
  Filled 2015-05-17 (×2): qty 1800

## 2015-05-17 NOTE — Progress Notes (Signed)
PARENTERAL NUTRITION CONSULT NOTE - FOLLOW UP  Pharmacy Consult for TPN Indication: EC fistula  No Known Allergies  Patient Measurements: Height: 5\' 4"  (162.6 cm) Weight: 218 lb 14.7 oz (99.3 kg) IBW/kg (Calculated) : 54.7  Weight 99.3 kg  Vital Signs: Temp: 97.7 F (36.5 C) (09/04 0524) Temp Source: Oral (09/04 0524) BP: 127/65 mmHg (09/04 0524) Pulse Rate: 102 (09/04 0524) Intake/Output from previous day: 09/03 0701 - 09/04 0700 In: 3408 [TPN:3408] Out: 800 [Urine:300; Drains:500] Intake/Output from this shift:   Labs: No results for input(s): WBC, HGB, HCT, PLT, APTT, INR in the last 72 hours.  Recent Labs  05/16/15 0607  NA 131*  K 4.6  CL 104  CO2 22  GLUCOSE 146*  BUN 42*  CREATININE 1.29*  CALCIUM 8.8*  MG 1.9   Estimated Creatinine Clearance: 43.8 mL/min (by C-G formula based on Cr of 1.29).   Recent Labs  05/16/15 2002 05/16/15 2310 05/17/15 0659  GLUCAP 192* 164* 148*     Insulin Requirements while Cyclic TPN infusing: 6 units of SSI  Nutritional Goals: per RD on 9/1 Protein: 100-110 g  Kcal: 1700-1900  Fluid: 1.7-1.9 L  Current Nutrition:  NPO Current Cyclic Clinimix E 7/49: 27ml/hr x 1 hour, 165 ml/hr x 10 hour, 50 ml/hr x 1 + 20% IVFE at 20 ml/hr x 12h provides 1758 kcal and 90 grams protein (meeting 100% kcal and 90% protein goals) (Chronic TPN PTA per [place] kcal, 85g protein)   Assessment: Continue chronic home TPN. 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 Drain output/24h: 1430 cc. Sandostatin added on 8/22 to help with closure and increased on 8/24 & 9/1 - current dose is 300 mcg q8h. If unable to help close, may need to address surgical interventions to close fistula. Surgery also considering placing NGT to help decrease drainage. No N/V or abdominal  pain. Does have some flatus and last BM was 8/22.Continue to control drainage in hopes that fistula will spontaneously close. May take a month for fistula to close so planning d/c to SNF once accepted and approved with insurance - appears the patient has been excepted to [facility] - bed will not be available until 9/6.  IV PPI for h/o GERD, po B12. Prealbumin 26.5 (8/29), alb 2.5  Endo: Hx gout: left ankle pain/acute gouty flare s/p prednisone taper. Hx DM: CBGs 164-192 while TPN running - required 6 units of SSI Lytes: K 4.6, Mg 1.9, Phos 3.7  Renal: Hx CKD3, SCr 1.29 << 1.23, CrCl~40-50 ml/min, UOP not accurately charted Pulm: 100% RA, OSA Cards: Hx HTN/HF/DL/CAD. BP/HR wnl. On lopressor (norvasc held) Hepatobil: AST 45 << 23, TG 107 (8/29), Tbili 0.9. Prealbumin 26.5 (8/29), alb 2.5, on octreotide.  Neuro: A&O ID: Afebrile, WBC WNL, no abx Anticoag: h/o DVT-not on anticoagulation secondary to recurrent diverticular and gastric bleeding and iron deficiency anemia (po iron). Lovenox 40mg /d. Hgb down to 7.8 with chronic anemia Best Practices: PPI IV, CHX, Bactroban, LMWH  TPN Access: PICC TPN start date: chronic  Plan:  - Continue cyclic Clinimix E 4/49: Total volume of 1800 mL over 12 hours. 81ml/hr x 1 hour, 165 ml/hr x 10 hours, 72ml/hr x 1 hour.  -  Continue cyclic 67% IVFE at 20 ml/hr x 12 hours. - TPN + IVFE will provide 1758 kcal (100% of needs) and 90 g of  protein (90% of needs)  - Continue MVI and trace elements in TPN - Continue moderate SSI - Will f/u TPN labs  Alycia Rossetti, PharmD, BCPS Clinical Pharmacist Pager: [phone] 05/17/2015 7:08 AM

## 2015-05-17 NOTE — Procedures (Signed)
Pt is resting comfortably on room air and refuses the use of CPAP.

## 2015-05-17 NOTE — Progress Notes (Signed)
Patient ID: Marissa [name], female   DOB: [birth year], 74 y.o.   MRN: [id number] [facility] Surgery Progress Note:   19 Days Post-Op  Subjective: Mental status is same as yesterday;  Answers questions appropriately-feels about the same Objective: Vital signs in last 24 hours: Temp:  [97.7 F (36.5 C)-98.6 F (37 C)] 97.7 F (36.5 C) (09/04 0524) Pulse Rate:  [81-102] 102 (09/04 0524) Resp:  [18] 18 (09/04 0524) BP: (127-152)/(63-66) 127/65 mmHg (09/04 0524) SpO2:  [100 %] 100 % (09/04 0524)  Intake/Output from previous day: 09/03 0701 - 09/04 0700 In: 3408 [TPN:3408] Out: 800 [Urine:300; Drains:500] Intake/Output this shift:    Physical Exam: Work of breathing is not labored;  Fistula out put up yesterday despite Sandozstatin  Lab Results:  Results for orders placed or performed during the hospital encounter of 04/27/15 (from the past 48 hour(s))  Glucose, capillary     Status: Abnormal   Collection Time: 05/15/15 12:19 PM  Result Value Ref Range   Glucose-Capillary 125 (H) 65 - 99 mg/dL   Comment 1 Repeat Test   Glucose, capillary     Status: None   Collection Time: 05/15/15  4:03 PM  Result Value Ref Range   Glucose-Capillary 86 65 - 99 mg/dL   Comment 1 Notify RN   Glucose, capillary     Status: Abnormal   Collection Time: 05/15/15  7:42 PM  Result Value Ref Range   Glucose-Capillary 174 (H) 65 - 99 mg/dL   Comment 1 Notify RN   Glucose, capillary     Status: Abnormal   Collection Time: 05/15/15 11:54 PM  Result Value Ref Range   Glucose-Capillary 148 (H) 65 - 99 mg/dL  Glucose, capillary     Status: Abnormal   Collection Time: 05/16/15  4:58 AM  Result Value Ref Range   Glucose-Capillary 174 (H) 65 - 99 mg/dL  Basic metabolic panel     Status: Abnormal   Collection Time: 05/16/15  6:07 AM  Result Value Ref Range   Sodium 131 (L) 135 - 145 mmol/L   Potassium 4.6 3.5 - 5.1 mmol/L   Chloride 104 101 - 111 mmol/L   CO2 22 22 - 32 mmol/L   Glucose, Bld 146 (H)  65 - 99 mg/dL   BUN 42 (H) 6 - 20 mg/dL   Creatinine, Ser 1.29 (H) 0.44 - 1.00 mg/dL   Calcium 8.8 (L) 8.9 - 10.3 mg/dL   GFR calc non Af Amer 40 (L) >60 mL/min   GFR calc Af Amer 46 (L) >60 mL/min    Comment: (NOTE) The eGFR has been calculated using the CKD EPI equation. This calculation has not been validated in all clinical situations. eGFR's persistently <60 mL/min signify possible Chronic Kidney Disease.    Anion gap 5 5 - 15  Magnesium     Status: None   Collection Time: 05/16/15  6:07 AM  Result Value Ref Range   Magnesium 1.9 1.7 - 2.4 mg/dL  Glucose, capillary     Status: Abnormal   Collection Time: 05/16/15  8:06 AM  Result Value Ref Range   Glucose-Capillary 101 (H) 65 - 99 mg/dL  Glucose, capillary     Status: Abnormal   Collection Time: 05/16/15  1:14 PM  Result Value Ref Range   Glucose-Capillary 123 (H) 65 - 99 mg/dL  Glucose, capillary     Status: None   Collection Time: 05/16/15  5:21 PM  Result Value Ref Range   Glucose-Capillary 84 65 -  99 mg/dL  Glucose, capillary     Status: Abnormal   Collection Time: 05/16/15  8:02 PM  Result Value Ref Range   Glucose-Capillary 192 (H) 65 - 99 mg/dL  Glucose, capillary     Status: Abnormal   Collection Time: 05/16/15 11:10 PM  Result Value Ref Range   Glucose-Capillary 164 (H) 65 - 99 mg/dL  Glucose, capillary     Status: Abnormal   Collection Time: 05/17/15  6:59 AM  Result Value Ref Range   Glucose-Capillary 148 (H) 65 - 99 mg/dL    Radiology/Results: No results found.  Anti-infectives: Anti-infectives    Start     Dose/Rate Route Frequency Ordered Stop   04/28/15 0000  cefOXitin (MEFOXIN) 1 g in dextrose 5 % 50 mL IVPB     1 g 100 mL/hr over 30 Minutes Intravenous  Once 04/27/15 1157 04/28/15 0859      Assessment/Plan: Problem List: Patient Active Problem List   Diagnosis Date Noted  . Hyponatremia 05/10/2015  . Acute idiopathic gout of left ankle   . Hyperkalemia 05/04/2015  . Type 2 diabetes  mellitus without complication   . D-dimer, elevated 04/16/2015  . Precordial chest pain 04/15/2015  . 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 Abdominal wall abscess 03/13/2015  . Non-compliant behavior 01/22/2015  . Anemia, iron deficiency 12/21/2014  . Diverticulosis of colon with hemorrhage 12/20/2014  . Chronic diastolic CHF (congestive heart failure) 06/29/2014  . Routine general medical examination at a health care facility 05/30/2014  . Dementia arising in the senium and presenium 03/29/2013  . Acute gouty arthritis 02/15/2012  . Other screening mammogram 02/15/2012  . Osteopenia 02/15/2012  . 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 Hyperlipidemia with target LDL less than 100 10/06/2011  . OSA (obstructive sleep apnea) 04/02/2009  . GERD 08/20/2006  . Gout 08/15/2006  . Essential hypertension 08/15/2006  . CAD S/P OM BMS 8/07, ISR-OM DES 8/09 08/15/2006  . Chronic kidney disease, stage 3 08/15/2006  . DVT, HX OF 08/15/2006    Continues on TNA and somatostatin for enterocutaneous fistula 19 Days Post-Op    LOS: 20 days   Matt B. Hassell Done, MD, [facility] [phone] beeper [phone]  05/17/2015 8:28 AM

## 2015-05-18 LAB — COMPREHENSIVE METABOLIC PANEL
ALBUMIN: 2.6 g/dL — AB (ref 3.5–5.0)
ALT: 35 U/L (ref 14–54)
AST: 25 U/L (ref 15–41)
Alkaline Phosphatase: 99 U/L (ref 38–126)
Anion gap: 7 (ref 5–15)
BUN: 51 mg/dL — AB (ref 6–20)
CHLORIDE: 104 mmol/L (ref 101–111)
CO2: 20 mmol/L — AB (ref 22–32)
CREATININE: 1.39 mg/dL — AB (ref 0.44–1.00)
Calcium: 8.8 mg/dL — ABNORMAL LOW (ref 8.9–10.3)
GFR calc Af Amer: 42 mL/min — ABNORMAL LOW (ref >60)
GFR, EST NON AFRICAN AMERICAN: 36 mL/min — AB (ref >60)
GLUCOSE: 119 mg/dL — AB (ref 65–99)
POTASSIUM: 4.5 mmol/L (ref 3.5–5.1)
SODIUM: 131 mmol/L — AB (ref 135–145)
Total Bilirubin: 0.1 mg/dL — ABNORMAL LOW (ref 0.3–1.2)
Total Protein: 6.4 g/dL — ABNORMAL LOW (ref 6.5–8.1)

## 2015-05-18 LAB — GLUCOSE, CAPILLARY
GLUCOSE-CAPILLARY: 128 mg/dL — AB (ref 65–99)
Glucose-Capillary: 102 mg/dL — ABNORMAL HIGH (ref 65–99)
Glucose-Capillary: 199 mg/dL — ABNORMAL HIGH (ref 65–99)
Glucose-Capillary: 179 mg/dL — ABNORMAL HIGH (ref 65–99)

## 2015-05-18 LAB — CBC
HEMATOCRIT: 27.3 % — AB (ref 36.0–46.0)
HEMOGLOBIN: 8.4 g/dL — AB (ref 12.0–15.0)
MCH: 22.5 pg — AB (ref 26.0–34.0)
MCHC: 30.8 g/dL (ref 30.0–36.0)
MCV: 73 fL — ABNORMAL LOW (ref 78.0–100.0)
Platelets: 165 10*3/uL (ref 150–400)
RBC: 3.74 MIL/uL — AB (ref 3.87–5.11)
RDW: 16.1 % — ABNORMAL HIGH (ref 11.5–15.5)
WBC: 7.5 10*3/uL (ref 4.0–10.5)

## 2015-05-18 LAB — DIFFERENTIAL
Basophils Absolute: 0 10*3/uL (ref 0.0–0.1)
Basophils Relative: 0 % (ref 0–1)
EOS PCT: 2 % (ref 0–5)
Eosinophils Absolute: 0.2 10*3/uL (ref 0.0–0.7)
LYMPHS PCT: 17 % (ref 12–46)
Lymphs Abs: 1.3 10*3/uL (ref 0.7–4.0)
MONOS PCT: 14 % — AB (ref 3–12)
Monocytes Absolute: 1.1 10*3/uL — ABNORMAL HIGH (ref 0.1–1.0)
NEUTROS ABS: 4.9 10*3/uL (ref 1.7–7.7)
NEUTROS PCT: 67 % (ref 43–77)

## 2015-05-18 LAB — PREALBUMIN: PREALBUMIN: 23.8 mg/dL (ref 18–38)

## 2015-05-18 LAB — MAGNESIUM: MAGNESIUM: 2 mg/dL (ref 1.7–2.4)

## 2015-05-18 LAB — PHOSPHORUS: Phosphorus: 4.1 mg/dL (ref 2.5–4.6)

## 2015-05-18 LAB — TRIGLYCERIDES: TRIGLYCERIDES: 136 mg/dL (ref <150)

## 2015-05-18 MED ORDER — CETYLPYRIDINIUM CHLORIDE 0.05 % MT LIQD
7.0000 mL | Freq: Two times a day (BID) | OROMUCOSAL | Status: DC
  Administered 2015-05-18 – 2015-05-21 (×5): 7 mL via OROMUCOSAL

## 2015-05-18 MED ORDER — FAT EMULSION 20 % IV EMUL
240.0000 mL | INTRAVENOUS | Status: AC
  Administered 2015-05-18: 240 mL via INTRAVENOUS
  Filled 2015-05-18: qty 250

## 2015-05-18 MED ORDER — OCTREOTIDE ACETATE 100 MCG/ML IJ SOLN
300.0000 ug | Freq: Three times a day (TID) | INTRAMUSCULAR | Status: DC
  Administered 2015-05-18 – 2015-05-20 (×7): 300 ug via SUBCUTANEOUS
  Filled 2015-05-18 (×13): qty 3

## 2015-05-18 MED ORDER — OCTREOTIDE ACETATE 100 MCG/ML IJ SOLN: 400.0000 ug | Freq: Three times a day (TID) | INTRAMUSCULAR | Status: DC

## 2015-05-18 MED ORDER — WHITE PETROLATUM GEL
Status: AC
  Administered 2015-05-18: 01:00:00
  Filled 2015-05-18: qty 1

## 2015-05-18 MED ORDER — ARTIFICIAL TEARS OP OINT
TOPICAL_OINTMENT | Freq: Three times a day (TID) | OPHTHALMIC | Status: DC
  Administered 2015-05-18 – 2015-05-19 (×5): via OPHTHALMIC
  Administered 2015-05-19: 1 via OPHTHALMIC
  Administered 2015-05-20 – 2015-05-21 (×4): via OPHTHALMIC
  Filled 2015-05-18 (×2): qty 3.5

## 2015-05-18 MED ORDER — CHLORHEXIDINE GLUCONATE 0.12 % MT SOLN
15.0000 mL | Freq: Two times a day (BID) | OROMUCOSAL | Status: DC
  Administered 2015-05-18 – 2015-05-21 (×9): 15 mL via OROMUCOSAL
  Filled 2015-05-18 (×7): qty 15

## 2015-05-18 MED ORDER — TRACE MINERALS CR-CU-MN-SE-ZN 10-1000-500-60 MCG/ML IV SOLN
INTRAVENOUS | Status: AC
  Administered 2015-05-18: 18:00:00 via INTRAVENOUS
  Filled 2015-05-18 (×2): qty 1800

## 2015-05-18 NOTE — Consult Note (Signed)
Small Eakin pouch has been intact since Fri; changed this AM. Mod amt green drainage in the cannister from suction catheter. Wound area 100% beefy red. Scar tissue below wound has become red and macerated with partial thickness skin loss again R/T leakage behind the bag which was occurring prior to the change.  Wound 8X2.8X3cm. Fistula location at 1:00 o'clock in the wound with a narrow tunneling area. [employer] pouch with barrier ring to maintain seal and adhere around valley of wound with low wall suction, attached to bag with a suction catheter.Supplies at bedside and instructions provided for staff nurses if leakage occurs; they can re-apply Eakin pouching sytem PRN. Julien Girt MSN, RN, CWOCN, CWCN-AP, CNS

## 2015-05-18 NOTE — Care Management Important Message (Signed)
Important Message  Patient Details  Name: Marissa [name] MRN: [id number] Date of Birth: 12/17/1940   Medicare Important Message Given:  Yes-fourth notification given    Loann Quill 05/18/2015, 9:24 AM

## 2015-05-18 NOTE — Progress Notes (Signed)
Patient continues to refuse nocturnal CPAP. Order discontinued per RT protocol.

## 2015-05-18 NOTE — Progress Notes (Signed)
PARENTERAL NUTRITION CONSULT NOTE - FOLLOW UP  Pharmacy Consult for TPN Indication: EC fistula  No Known Allergies  Patient Measurements: Height: 5\' 4"  (162.6 cm) Weight: 218 lb 14.7 oz (99.3 kg) IBW/kg (Calculated) : 54.7  Weight 99.3 kg  Vital Signs: Temp: 98.4 F (36.9 C) (09/05 0542) Temp Source: Oral (09/05 0542) BP: 115/58 mmHg (09/05 0542) Pulse Rate: 94 (09/05 0542) Intake/Output from previous day: 09/04 0701 - 09/05 0700 In: 6240.4 [TPN:6240.4] Out: 600 [Drains:600] Intake/Output from this shift:   Labs:  Recent Labs  05/18/15 0557  WBC 7.5  HGB 8.4*  HCT 27.3*  PLT 165    Recent Labs  05/16/15 0607 05/18/15 0557  NA 131* 131*  K 4.6 4.5  CL 104 104  CO2 22 20*  GLUCOSE 146* 119*  BUN 42* 51*  CREATININE 1.29* 1.39*  CALCIUM 8.8* 8.8*  MG 1.9 2.0  PHOS  --  4.1  PROT  --  6.4*  ALBUMIN  --  2.6*  AST  --  25  ALT  --  35  ALKPHOS  --  99  BILITOT  --  0.1*  TRIG  --  136   Estimated Creatinine Clearance: 40.6 mL/min (by C-G formula based on Cr of 1.39).   Recent Labs  05/17/15 2011 05/17/15 2342 05/18/15 0650  GLUCAP 183* 145* 102*     Insulin Requirements while Cyclic TPN infusing: 5 units of SSI  Nutritional Goals: per RD on 9/1 Protein: 100-110 g  Kcal: 1700-1900  Fluid: 1.7-1.9 L  Current Nutrition:  NPO Current Cyclic Clinimix E 9/19: 26ml/hr x 1 hour, 165 ml/hr x 10 hour, 50 ml/hr x 1 + 20% IVFE at 20 ml/hr x 12h provides 1758 kcal and 90 grams protein (meeting 100% kcal and 90% protein goals) (Chronic TPN PTA per [place] kcal, 85g protein)   Assessment: Continue chronic home TPN. 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 Drain output/24h: 1430 cc. Sandostatin added on 8/22 to help with closure and increased on 8/24 & 9/1 - current dose is  300 mcg q8h. If unable to help close, may need to address surgical interventions to close fistula. Surgery also considering placing NGT to help decrease drainage. No N/V or abdominal pain. Does have some flatus and last BM was 8/22.Continue to control drainage in hopes that fistula will spontaneously close. May take a month for fistula to close so planning d/c to SNF once accepted and approved with insurance - appears the patient has been excepted to [facility] - bed will not be available until 9/6.  IV PPI for h/o GERD, po B12. Prealbumin on 9/5 - IP, 26.5 (8/29), alb 2.5  Endo: Hx gout: left ankle pain/acute gouty flare s/p prednisone taper. Hx DM: CBGs 108-182 while TPN running - required 5 units of SSI Lytes: K 4.5, Mg 2, Phos 4.1  Renal: Hx CKD3, SCr 1.39 << 1.29, CrCl~40 ml/min, UOP not accurately charted Pulm: 100% RA, OSA Cards: Hx HTN/HF/DL/CAD. BP/HR wnl. On lopressor (norvasc held) Hepatobil: LFTs/Alk Phos wnl, TG 136 (9/5) << 107 (8/29), Tbili 0.9. Prealbumin - IP on 9/5,  26.5 (8/29), alb 2.6, on octreotide.  Neuro: A&O ID: Afebrile, WBC WNL, no abx Anticoag: h/o DVT-not on anticoagulation secondary to recurrent diverticular and gastric bleeding and iron deficiency anemia (po iron). Lovenox 40mg /d. Hgb down to 7.8 with chronic  anemia Best Practices: PPI IV, CHX, Bactroban, LMWH  TPN Access: PICC TPN start date: chronic  Plan:  - Continue cyclic Clinimix E 4/99: Total volume of 1800 mL over 12 hours. 18ml/hr x 1 hour, 165 ml/hr x 10 hours, 24ml/hr x 1 hour.  -  Continue cyclic 69% IVFE at 20 ml/hr x 12 hours. - TPN + IVFE will provide 1758 kcal (100% of needs) and 90 g of protein (90% of needs)  - Continue MVI and trace elements in TPN - Continue moderate SSI - Will f/u TPN labs  Alycia Rossetti, PharmD, BCPS Clinical Pharmacist Pager: [phone] 05/18/2015 7:20 AM

## 2015-05-18 NOTE — Progress Notes (Signed)
Patient ID: Marissa [name], female   DOB: 11-03-1940, 74 y.o.   MRN: [id number] [facility] Surgery Progress Note:   20 Days Post-Op  Subjective: Mental status is alert but slow.  Complaining of dry eyes Objective: Vital signs in last 24 hours: Temp:  [97.9 F (36.6 C)-98.4 F (36.9 C)] 98.4 F (36.9 C) (09/05 0542) Pulse Rate:  [73-94] 94 (09/05 0542) Resp:  [18-19] 19 (09/05 0542) BP: (115-132)/(55-64) 115/58 mmHg (09/05 0542) SpO2:  [99 %-100 %] 100 % (09/05 0542)  Intake/Output from previous day: 09/04 0701 - 09/05 0700 In: 6240.4 [TPN:6240.4] Out: 600 [Drains:600] Intake/Output this shift: Total I/O In: -  Out: 400 [Urine:400]  Physical Exam: Work of breathing is normal.  Fistula persists  Lab Results:  Results for orders placed or performed during the hospital encounter of 04/27/15 (from the past 48 hour(s))  Glucose, capillary     Status: Abnormal   Collection Time: 05/16/15  1:14 PM  Result Value Ref Range   Glucose-Capillary 123 (H) 65 - 99 mg/dL  Glucose, capillary     Status: None   Collection Time: 05/16/15  5:21 PM  Result Value Ref Range   Glucose-Capillary 84 65 - 99 mg/dL  Glucose, capillary     Status: Abnormal   Collection Time: 05/16/15  8:02 PM  Result Value Ref Range   Glucose-Capillary 192 (H) 65 - 99 mg/dL  Glucose, capillary     Status: Abnormal   Collection Time: 05/16/15 11:10 PM  Result Value Ref Range   Glucose-Capillary 164 (H) 65 - 99 mg/dL  Glucose, capillary     Status: Abnormal   Collection Time: 05/17/15  6:59 AM  Result Value Ref Range   Glucose-Capillary 148 (H) 65 - 99 mg/dL  Glucose, capillary     Status: Abnormal   Collection Time: 05/17/15 12:17 PM  Result Value Ref Range   Glucose-Capillary 109 (H) 65 - 99 mg/dL  Glucose, capillary     Status: None   Collection Time: 05/17/15  5:21 PM  Result Value Ref Range   Glucose-Capillary 90 65 - 99 mg/dL  Glucose, capillary     Status: Abnormal   Collection Time: 05/17/15  8:11  PM  Result Value Ref Range   Glucose-Capillary 183 (H) 65 - 99 mg/dL  Glucose, capillary     Status: Abnormal   Collection Time: 05/17/15 11:42 PM  Result Value Ref Range   Glucose-Capillary 145 (H) 65 - 99 mg/dL  Comprehensive metabolic panel     Status: Abnormal   Collection Time: 05/18/15  5:57 AM  Result Value Ref Range   Sodium 131 (L) 135 - 145 mmol/L   Potassium 4.5 3.5 - 5.1 mmol/L   Chloride 104 101 - 111 mmol/L   CO2 20 (L) 22 - 32 mmol/L   Glucose, Bld 119 (H) 65 - 99 mg/dL   BUN 51 (H) 6 - 20 mg/dL   Creatinine, Ser 1.39 (H) 0.44 - 1.00 mg/dL   Calcium 8.8 (L) 8.9 - 10.3 mg/dL   Total Protein 6.4 (L) 6.5 - 8.1 g/dL   Albumin 2.6 (L) 3.5 - 5.0 g/dL   AST 25 15 - 41 U/L   ALT 35 14 - 54 U/L   Alkaline Phosphatase 99 38 - 126 U/L   Total Bilirubin 0.1 (L) 0.3 - 1.2 mg/dL   GFR calc non Af Amer 36 (L) >60 mL/min   GFR calc Af Amer 42 (L) >60 mL/min    Comment: (NOTE) The eGFR has  been calculated using the CKD EPI equation. This calculation has not been validated in all clinical situations. eGFR's persistently <60 mL/min signify possible Chronic Kidney Disease.    Anion gap 7 5 - 15  Magnesium     Status: None   Collection Time: 05/18/15  5:57 AM  Result Value Ref Range   Magnesium 2.0 1.7 - 2.4 mg/dL  Phosphorus     Status: None   Collection Time: 05/18/15  5:57 AM  Result Value Ref Range   Phosphorus 4.1 2.5 - 4.6 mg/dL  CBC     Status: Abnormal   Collection Time: 05/18/15  5:57 AM  Result Value Ref Range   WBC 7.5 4.0 - 10.5 K/uL   RBC 3.74 (L) 3.87 - 5.11 MIL/uL   Hemoglobin 8.4 (L) 12.0 - 15.0 g/dL   HCT 27.3 (L) 36.0 - 46.0 %   MCV 73.0 (L) 78.0 - 100.0 fL   MCH 22.5 (L) 26.0 - 34.0 pg   MCHC 30.8 30.0 - 36.0 g/dL   RDW 16.1 (H) 11.5 - 15.5 %   Platelets 165 150 - 400 K/uL    Comment: PLATELET COUNT CONFIRMED BY SMEAR  Differential     Status: Abnormal   Collection Time: 05/18/15  5:57 AM  Result Value Ref Range   Neutrophils Relative % 67 43 - 77  %   Lymphocytes Relative 17 12 - 46 %   Monocytes Relative 14 (H) 3 - 12 %   Eosinophils Relative 2 0 - 5 %   Basophils Relative 0 0 - 1 %   Neutro Abs 4.9 1.7 - 7.7 K/uL   Lymphs Abs 1.3 0.7 - 4.0 K/uL   Monocytes Absolute 1.1 (H) 0.1 - 1.0 K/uL   Eosinophils Absolute 0.2 0.0 - 0.7 K/uL   Basophils Absolute 0.0 0.0 - 0.1 K/uL   RBC Morphology TEARDROP CELLS   Prealbumin     Status: None   Collection Time: 05/18/15  5:57 AM  Result Value Ref Range   Prealbumin 23.8 18 - 38 mg/dL  Triglycerides     Status: None   Collection Time: 05/18/15  5:57 AM  Result Value Ref Range   Triglycerides 136 <150 mg/dL  Glucose, capillary     Status: Abnormal   Collection Time: 05/18/15  6:50 AM  Result Value Ref Range   Glucose-Capillary 102 (H) 65 - 99 mg/dL    Radiology/Results: No results found.  Anti-infectives: Anti-infectives    Start     Dose/Rate Route Frequency Ordered Stop   04/28/15 0000  cefOXitin (MEFOXIN) 1 g in dextrose 5 % 50 mL IVPB     1 g 100 mL/hr over 30 Minutes Intravenous  Once 04/27/15 1157 04/28/15 0859      Assessment/Plan: Problem List: Patient Active Problem List   Diagnosis Date Noted  . Hyponatremia 05/10/2015  . Acute idiopathic gout of left ankle   . Hyperkalemia 05/04/2015  . Type 2 diabetes mellitus without complication   . D-dimer, elevated 04/16/2015  . Precordial chest pain 04/15/2015  . 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 Abdominal wall abscess 03/13/2015  . Non-compliant behavior 01/22/2015  . Anemia, iron deficiency 12/21/2014  . Diverticulosis of colon with hemorrhage 12/20/2014  . Chronic diastolic CHF (congestive heart failure) 06/29/2014  . Routine general medical examination at a health care facility 05/30/2014  . Dementia arising in the senium and presenium 03/29/2013  . Acute gouty arthritis 02/15/2012  . Other screening mammogram 02/15/2012  . Osteopenia 02/15/2012  .  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 Hyperlipidemia with  target LDL less than 100 10/06/2011  . OSA (obstructive sleep apnea) 04/02/2009  . GERD 08/20/2006  . Gout 08/15/2006  . Essential hypertension 08/15/2006  . CAD S/P OM BMS 8/07, ISR-OM DES 8/09 08/15/2006  . Chronic kidney disease, stage 3 08/15/2006  . DVT, HX OF 08/15/2006    Add lacrilube;  Likely SNF placement  20 Days Post-Op    LOS: 21 days   Matt B. Hassell Done, MD, [facility] [phone] beeper [phone]  05/18/2015 8:45 AM

## 2015-05-19 LAB — GLUCOSE, CAPILLARY
GLUCOSE-CAPILLARY: 103 mg/dL — AB (ref 65–99)
GLUCOSE-CAPILLARY: 176 mg/dL — AB (ref 65–99)
Glucose-Capillary: 133 mg/dL — ABNORMAL HIGH (ref 65–99)
Glucose-Capillary: 158 mg/dL — ABNORMAL HIGH (ref 65–99)

## 2015-05-19 MED ORDER — TRACE MINERALS CR-CU-MN-SE-ZN 10-1000-500-60 MCG/ML IV SOLN
INTRAVENOUS | Status: AC
  Administered 2015-05-19: 17:00:00 via INTRAVENOUS
  Filled 2015-05-19: qty 1800

## 2015-05-19 MED ORDER — FAT EMULSION 20 % IV EMUL
240.0000 mL | INTRAVENOUS | Status: AC
  Administered 2015-05-19: 240 mL via INTRAVENOUS
  Filled 2015-05-19: qty 250

## 2015-05-19 NOTE — Progress Notes (Signed)
PARENTERAL NUTRITION CONSULT NOTE - FOLLOW UP  Pharmacy Consult for TPN Indication: EC fistula  No Known Allergies  Patient Measurements: Height: 5\' 4"  (162.6 cm) Weight: 218 lb 14.7 oz (99.3 kg) IBW/kg (Calculated) : 54.7  Weight 99.3 kg  Vital Signs: Temp: 98.2 F (36.8 C) (09/05 2306) Temp Source: Oral (09/05 2306) BP: 116/56 mmHg (09/05 2306) Pulse Rate: 77 (09/05 2306) Intake/Output from previous day: 09/05 0701 - 09/06 0700 In: 2121 [P.O.:160; I.V.:20; TPN:1941] Out: 2250 [Urine:1850; Drains:400] Intake/Output from this shift:   Labs:  Recent Labs  05/18/15 0557  WBC 7.5  HGB 8.4*  HCT 27.3*  PLT 165    Recent Labs  05/18/15 0557  NA 131*  K 4.5  CL 104  CO2 20*  GLUCOSE 119*  BUN 51*  CREATININE 1.39*  CALCIUM 8.8*  MG 2.0  PHOS 4.1  PROT 6.4*  ALBUMIN 2.6*  AST 25  ALT 35  ALKPHOS 99  BILITOT 0.1*  PREALBUMIN 23.8  TRIG 136   Estimated Creatinine Clearance: 40.6 mL/min (by C-G formula based on Cr of 1.39).   Recent Labs  05/18/15 2015 05/18/15 2255 05/19/15 0644  GLUCAP 199* 179* 103*     Insulin Requirements while Cyclic TPN infusing: 8 units of SSI  Nutritional Goals: per RD on 9/1 Protein: 100-110 g  Kcal: 1700-1900  Fluid: 1.7-1.9 L  Current Nutrition:  NPO Current Cyclic Clinimix E 0/16: 66ml/hr x 1 hour, 165 ml/hr x 10 hour, 75 ml/hr x 1 + 20% IVFE at 20 ml/hr x 12h provides 1758 kcal and 90 grams protein (meeting 100% kcal and 90% protein goals) (Chronic TPN PTA per [place] kcal, 85g protein)   Assessment: Continue chronic home TPN. 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 Drain output/24h: down to 800 cc. Sandostatin added on 8/22 to help with closure and increased on 8/24 & 9/1 - current dose is 300 mcg q8h. May need to consider stopping  octreotide soon and considering surgical interventions for fistula closure as it has been about 2 weeks without much benefit. Surgery also considering placing NGT to help decrease drainage. No N/V or abdominal pain. Does have some flatus and last BM was 8/22.Continue to control drainage in hopes that fistula will spontaneously close. May take a month for fistula to close so planning d/c to SNF ([facility]) once accepted and approved with insurance. IV PPI for h/o GERD, po B12. Prealbumin on 9/5 - IP, 26.5 (8/29), alb 2.5  Endo: Hx gout: left ankle pain/acute gouty flare s/p prednisone taper. Hx DM: CBGs off TPN 100-120s, on TPN are elevated at 170-190s. - required 5 units of SSI  Lytes: Lytes wnl exc Na 131. CoCa 9.7  Renal: Hx CKD3, SCr 1.39, CrCl~40 ml/min, UOP 0.69ml/kg.hr yesterday. I/O about equal yesterday  Pulm: 100% RA, OSA  Cards: Hx HTN/HF/DL/CAD. BP/HR wnl. On lopressor (norvasc held)  Hepatobil: Albumin 2.6, last prealbumin 23.8 (up from admission) LFTs wnl, TG 136 (9/5), Tbili 0.1. On octreotide.   Neuro: A&O  ID: Afebrile, WBC WNL, no abx  Anticoag: h/o DVT-not on anticoagulation secondary to recurrent diverticular and gastric bleeding and iron deficiency anemia (po iron). Lovenox 40mg /d. Hgb down to 7.8 with chronic anemia  Best Practices: PPI IV, CHX, Bactroban, LMWH  TPN Access: PICC TPN start date: chronic  Plan:  Continue cyclic Clinimix E 0/10: Total  volume of 1800 mL over 12 hours. 62ml/hr x 1 hour, 165 ml/hr x 10 hours, 62ml/hr x 1 hour.  Continue cyclic 91% IVFE at 20 ml/hr x 12 hours. TPN + IVFE will provide 1758 kcal (100% of needs) and 90 g of protein (90% of needs)  Continue MVI and trace elements in TPN Continue moderate SSI Will f/u TPN labs  Consider need to continue octreotide as it has been 2 weeks with no real change in output  Elenor Quinones, PharmD Clinical Pharmacist Pager [phone] 05/19/2015 9:13 AM

## 2015-05-19 NOTE — Care Management Note (Signed)
Case Management Note  Patient Details  Name: Grey Schlauch MRN: [id number] Date of Birth: [birth year]  Subjective/Objective:                    Action/Plan:   Expected Discharge Date:                  Expected Discharge Plan:  Skilled Nursing Facility  In-House Referral:  Clinical Social Work  Discharge planning Services     Post Acute Care Choice:    Choice offered to:     DME Arranged:    DME Agency:     HH Arranged:    [place] Agency:     Status of Service:  In process, will continue to follow  Medicare Important Message Given:  Yes-fourth notification given Date Medicare IM Given:    Medicare IM give by:    Date Additional Medicare IM Given:    Additional Medicare Important Message give by:     If discussed at [place] of Stay Meetings, dates discussed:  05-19-15  Additional Comments:  Marilu Favre, RN 05/19/2015, 3:09 PM

## 2015-05-19 NOTE — Consult Note (Signed)
WOC follow-up: Eakin pouch intact with good seal to abd fistula wound.Bedisde nurse had to change during the night since it leaked over the lower abd where scar tissue had previously become macerated. Large amt green drainage in suction cannister, which remain to low wall suction. Supplies in room and instructions are available for bedside nurses if leakage occurs. Julien Girt MSN, RN, [place], [place], [place]

## 2015-05-19 NOTE — Clinical Social Work Note (Signed)
CSW received consult for SNF placement for patient.  CSW spoke to [facility] admissions who stated they can take patient as soon as a bed is available.  [facility] admissions liason stated SNF does not have a bed available today, but maybe tomorrow or Thursday.  CSW informed case Freight forwarder and patient's insurance company.  [facility] SNF will contact CSW once a bed is available.  CSW to continue to follow patient's progress.  Jones Broom. Quinn, MSW, [place] 05/19/2015 5:12 PM

## 2015-05-20 LAB — GLUCOSE, CAPILLARY
GLUCOSE-CAPILLARY: 129 mg/dL — AB (ref 65–99)
GLUCOSE-CAPILLARY: 185 mg/dL — AB (ref 65–99)
Glucose-Capillary: 106 mg/dL — ABNORMAL HIGH (ref 65–99)
Glucose-Capillary: 122 mg/dL — ABNORMAL HIGH (ref 65–99)
Glucose-Capillary: 169 mg/dL — ABNORMAL HIGH (ref 65–99)

## 2015-05-20 MED ORDER — M.V.I. ADULT IV INJ
INJECTION | INTRAVENOUS | Status: AC
  Administered 2015-05-20: 18:00:00 via INTRAVENOUS
  Filled 2015-05-20: qty 1800

## 2015-05-20 MED ORDER — FAT EMULSION 20 % IV EMUL
240.0000 mL | INTRAVENOUS | Status: AC
  Administered 2015-05-20: 240 mL via INTRAVENOUS
  Filled 2015-05-20: qty 250

## 2015-05-20 NOTE — Progress Notes (Signed)
Nutrition Follow-up  DOCUMENTATION CODES:   Obesity unspecified  INTERVENTION:   -TPN management per pharmacy -RD will follow for diet advancement and supplement diet as appropriate  NUTRITION DIAGNOSIS:   Inadequate oral intake related to inability to eat as evidenced by NPO status.  Ongoing  GOAL:   Patient will meet greater than or equal to 90% of their needs  Met with TPN  MONITOR:   Diet advancement, PO intake, Labs, Weight trends, Skin, I & O's  REASON FOR ASSESSMENT:   Malnutrition Screening Tool, Consult New TPN/TNA  ASSESSMENT:   The patient is a 74 year old female presenting to discuss diagnostic procedure results. The patient is a 74 year old female who presents with a subcutaneous abscess and a very complex past surgical history.   S/p Procedure 04/29/17:  Abdominal wound exploration/ explantation of infected abdominal mesh  Reviewed COWRN note from 05/20/15. Eakin pouch has been intact since 05/18/15 and was changed this AM. Wound has decreased in size and less red and macerated. Eakin pouch to gravity drainage today to evaluate if pt could be managed at home without suction.   Reviewed pharmacy note. Pt remains NPO on chronic TPN regimen: cyclic Clinimix E 0/78: Total volume of 1800 mL over 12 hours. 51ml/hr x 1 hour, 165 ml/hr x 10 hours, 60ml/hr x 1 hour and cyclic 67% IVFE at 20 ml/hr x 12 hours. TPN + IVFE will provide 1758 kcal (100% of needs) and 90 g of protein (90% of needs) .   CSW following. Discharge dispotistion is LTACH when bed available vs home with home health if eakin can be managed without suction. Pt refusing SNF placement.   Labs reviewed. CBGS: 106-176.  Diet Order:  Diet NPO time specified Except for: Ice Chips, Sips with Meds TPN (CLINIMIX-E) Adult  Skin:  Wound (see comment) (closed abdominal incision)  Last BM:  05/04/15  Height:   Ht Readings from Last 1 Encounters:  05/01/15 $RemoveB'5\' 4"'[id number]$  (1.626 m)    Weight:   Wt Readings  from Last 1 Encounters:  05/01/15 218 lb 14.7 oz (99.3 kg)    Ideal Body Weight:  54.5 kg  BMI:  Body mass index is 37.56 kg/(m^2).  Estimated Nutritional Needs:   Kcal:  1700-1900  Protein:  100-110 grams  Fluid:  1.7-1.9 L  EDUCATION NEEDS:   No education needs identified at this time  Topher Buenaventura A. Jimmye Norman, RD, LDN, CDE Pager: [phone] After hours Pager: [phone]

## 2015-05-20 NOTE — Consult Note (Addendum)
Small Eakin pouch has been intact since Mon; changed this AM. There is no output in the cannister from suction catheter at this time; patient states this was changed at 0630. Wound area 100% beefy red. Scar tissue below wound is less red and macerated with partial thickness skin loss. Wound slightly decreased in size; 7.5X2.5X3cm. Fistula location at 1:00 o'clock in the wound with a narrow tunneling area. Applied Eakin pouch with barrier ring to maintain seal and adhere around valley of wound.  Pt does not want to go to a SNF; she would prefer to discharge home if the output is manageable.  She states her daughter is familiar with application of an Eakin pouch to the fistula site, and they were able to manage with home health assistance before this admission. Pt is very knowledgeable regarding pouch application process. Left to gravity drainage today to see if it could be managed at home without suction.  Pt states she will call for assistance with emptying to avoid over-filling which might lead to leakage.  Discussed plan of care via phone call with CCS PA.Supplies at bedside and instructions provided for staff nurses if leakage occurs; they can re-apply Eakin pouching sytem PRN. Julien Girt MSN, RN, CWOCN, CWCN-AP, CNS

## 2015-05-20 NOTE — Progress Notes (Signed)
I spoke to her insurance company about preauthorization of her sandostatin.  Her insurance company denied this coverage.  Therefore, unfortunately we will stop this as the patient will not be able to get this at home.  We need to see what her output does now that this will be stopped.  Marissa [name] E 4:04 PM 05/20/2015

## 2015-05-20 NOTE — Progress Notes (Signed)
Patient ID: Marissa [name], female   DOB: November 14, 1940, 74 y.o.   MRN: [id number] 22 Days Post-Op  Subjective: Pt is fairly adamant that her output in the last day has been essentially none, although it's documented that she had an increase to 900cc.    Objective: Vital signs in last 24 hours: Temp:  [98.3 F (36.8 C)] 98.3 F (36.8 C) (09/07 0630) Pulse Rate:  [77-90] 90 (09/07 0630) Resp:  [17-18] 17 (09/07 0630) BP: (108-130)/(58-66) 130/58 mmHg (09/07 0630) SpO2:  [100 %] 100 % (09/07 0630) Last BM Date: 05/04/15  Intake/Output from previous day: 09/06 0701 - 09/07 0700 In: 0  Out: 2950 [Urine:2000; Drains:950] Intake/Output this shift:    PE: Abd: soft, minimally tender, Eakin's pouch in place with red rubber tube connecting to cannister.  Cannister just changed so not output there right now  Lab Results:   Recent Labs  05/18/15 0557  WBC 7.5  HGB 8.4*  HCT 27.3*  PLT 165   BMET  Recent Labs  05/18/15 0557  NA 131*  K 4.5  CL 104  CO2 20*  GLUCOSE 119*  BUN 51*  CREATININE 1.39*  CALCIUM 8.8*   PT/INR No results for input(s): LABPROT, INR in the last 72 hours. CMP     Component Value Date/Time   NA 131* 05/18/2015 0557   K 4.5 05/18/2015 0557   CL 104 05/18/2015 0557   CO2 20* 05/18/2015 0557   GLUCOSE 119* 05/18/2015 0557   BUN 51* 05/18/2015 0557   CREATININE 1.39* 05/18/2015 0557   CREATININE 1.64* 06/27/2014 1647   CALCIUM 8.8* 05/18/2015 0557   PROT 6.4* 05/18/2015 0557   ALBUMIN 2.6* 05/18/2015 0557   AST 25 05/18/2015 0557   ALT 35 05/18/2015 0557   ALKPHOS 99 05/18/2015 0557   BILITOT 0.1* 05/18/2015 0557   GFRNONAA 36* 05/18/2015 0557   GFRAA 42* 05/18/2015 0557   Lipase     Component Value Date/Time   LIPASE 27 12/26/2014 1815       Studies/Results: No results found.  Anti-infectives: Anti-infectives    Start     Dose/Rate Route Frequency Ordered Stop   04/28/15 0000  cefOXitin (MEFOXIN) 1 g in dextrose 5 % 50 mL IVPB      1 g 100 mL/hr over 30 Minutes Intravenous  Once 04/27/15 1157 04/28/15 0859       Assessment/Plan  POD #22 s/p Abdominal wound exploration/explanation of infected abdominal mesh, placement of penrose drain -Afebrile, normal WBC -Cyclical TPN -patient stable for DC to SNF/LTAC when bed available -Ambulate and IS -SCD's and lovenox -cont current management as fistula still seems to be be putting out enough that the patient is not able to eat yet.  Gout flare (left ankle) - resolved CKD Stage 3 - Cr 1.39 Disp - see DC summary.  No new updates from yesterday.  To LTAC/SNF when bed available  LOS: 23 days    Lexus Barletta E 05/20/2015, 8:02 AM Pager: [phone]

## 2015-05-20 NOTE — Progress Notes (Signed)
PARENTERAL NUTRITION CONSULT NOTE - FOLLOW UP  Pharmacy Consult for TPN Indication: EC fistula  No Known Allergies  Patient Measurements: Height: 5\' 4"  (162.6 cm) Weight: 218 lb 14.7 oz (99.3 kg) IBW/kg (Calculated) : 54.7  Weight 99.3 kg  Vital Signs: Temp: 98.3 F (36.8 C) (09/07 0630) Temp Source: Oral (09/07 0630) BP: 130/58 mmHg (09/07 0630) Pulse Rate: 90 (09/07 0630) Intake/Output from previous day: 09/06 0701 - 09/07 0700 In: 0  Out: 2950 [Urine:2000; Drains:950] Intake/Output from this shift:   Labs:  Recent Labs  05/18/15 0557  WBC 7.5  HGB 8.4*  HCT 27.3*  PLT 165    Recent Labs  05/18/15 0557  NA 131*  K 4.5  CL 104  CO2 20*  GLUCOSE 119*  BUN 51*  CREATININE 1.39*  CALCIUM 8.8*  MG 2.0  PHOS 4.1  PROT 6.4*  ALBUMIN 2.6*  AST 25  ALT 35  ALKPHOS 99  BILITOT 0.1*  PREALBUMIN 23.8  TRIG 136   Estimated Creatinine Clearance: 40.6 mL/min (by C-G formula based on Cr of 1.39).   Recent Labs  05/19/15 2032 05/19/15 2311 05/20/15 0623  GLUCAP 176* 158* 106*     Insulin Requirements while Cyclic TPN infusing: 10 units of SSI  Nutritional Goals: per RD on 9/1 Protein: 100-110 g  Kcal: 1700-1900  Fluid: 1.7-1.9 L  Current Nutrition:  NPO Current Cyclic Clinimix E 6/94: 7ml/hr x 1 hour, 165 ml/hr x 10 hour, 75 ml/hr x 1 + 20% IVFE at 20 ml/hr x 12h provides 1758 kcal and 90 grams protein (meeting 100% kcal and 90% protein goals) (Chronic TPN PTA per [place] kcal, 85g protein)   Assessment: Continue chronic home TPN. 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 Sandostatin added on 8/22 to help with closure and increased on 8/24 & 9/1 - current dose is 300 mcg q8h. Drain output/24h: 950 cc. May need to consider stopping octreotide soon and considering  surgical interventions for fistula closure as it has been about 2 weeks without much benefit. Surgery also considering placing NGT to help decrease drainage. No N/V or abdominal pain. Does have some flatus and last BM was 8/22.Continue to control drainage in hopes that fistula will spontaneously close. May take a month for fistula to close so planning d/c to SNF ([facility]) once bed available which could be today or tomorrow. IV PPI for h/o GERD, po B12. Prealbumin on 9/5 - IP, 26.5 (8/29), alb 2.5  Endo: Hx gout: left ankle pain/acute gouty flare s/p prednisone taper. Hx DM: CBGs off TPN 100-130s, on TPN are elevated at 150-170s.  Lytes: Lytes wnl exc Na 131 on 9/6. CoCa 9.7. No new labs today  Renal: Hx CKD3, SCr 1.39, CrCl~40 ml/min, UOP stable at  0.57ml/kg.hr yesterday. I/O not charted correctly, about equal yesterday  Pulm: 100% RA, OSA  Cards: Hx HTN/HF/DL/CAD. BP/HR wnl. On lopressor (norvasc held)  Hepatobil: Albumin 2.6, last prealbumin 23.8 (up from admission) LFTs wnl, TG 136 (9/5), Tbili 0.1. On octreotide.   Neuro: A&O  ID: Afebrile, WBC WNL, no abx  Anticoag: h/o DVT-not on anticoagulation secondary to recurrent diverticular and gastric bleeding and iron deficiency anemia (po iron). Lovenox 40mg /d. Hgb down to 7.8 with chronic anemia  Best Practices: PPI IV, CHX, Bactroban, LMWH  TPN Access: PICC TPN start date: chronic  Plan:  Continue  cyclic Clinimix E 2/58: Total volume of 1800 mL over 12 hours. 110ml/hr x 1 hour, 165 ml/hr x 10 hours, 71ml/hr x 1 hour.  Continue cyclic 52% IVFE at 20 ml/hr x 12 hours. TPN + IVFE will provide 1758 kcal (100% of needs) and 90 g of protein (90% of needs)  Continue MVI and trace elements in TPN Continue moderate SSI Monitor TPN labs Mon/Thurs  Consider need to continue octreotide as it has been 2 weeks with no real change in output  Elenor Quinones, PharmD Clinical Pharmacist Pager [phone] 05/20/2015 7:37 AM

## 2015-05-21 LAB — COMPREHENSIVE METABOLIC PANEL
ALT: 32 U/L (ref 14–54)
AST: 26 U/L (ref 15–41)
Albumin: 2.5 g/dL — ABNORMAL LOW (ref 3.5–5.0)
Alkaline Phosphatase: 95 U/L (ref 38–126)
Anion gap: 8 (ref 5–15)
BILIRUBIN TOTAL: 0.5 mg/dL (ref 0.3–1.2)
BUN: 48 mg/dL — AB (ref 6–20)
CALCIUM: 8.7 mg/dL — AB (ref 8.9–10.3)
CO2: 22 mmol/L (ref 22–32)
CREATININE: 1.45 mg/dL — AB (ref 0.44–1.00)
Chloride: 103 mmol/L (ref 101–111)
GFR calc Af Amer: 40 mL/min — ABNORMAL LOW (ref >60)
GFR, EST NON AFRICAN AMERICAN: 35 mL/min — AB (ref >60)
Glucose, Bld: 116 mg/dL — ABNORMAL HIGH (ref 65–99)
Potassium: 4.4 mmol/L (ref 3.5–5.1)
Sodium: 133 mmol/L — ABNORMAL LOW (ref 135–145)
TOTAL PROTEIN: 6.1 g/dL — AB (ref 6.5–8.1)

## 2015-05-21 LAB — MAGNESIUM: MAGNESIUM: 1.9 mg/dL (ref 1.7–2.4)

## 2015-05-21 LAB — GLUCOSE, CAPILLARY: Glucose-Capillary: 138 mg/dL — ABNORMAL HIGH (ref 65–99)

## 2015-05-21 LAB — PHOSPHORUS: Phosphorus: 3.7 mg/dL (ref 2.5–4.6)

## 2015-05-21 MED ORDER — OXYCODONE HCL 5 MG PO TABS: 5.0000 mg | ORAL_TABLET | Freq: Four times a day (QID) | ORAL | Status: DC | PRN

## 2015-05-21 MED ORDER — M.V.I. ADULT IV INJ
INJECTION | INTRAVENOUS | Status: DC
  Filled 2015-05-21: qty 1800

## 2015-05-21 MED ORDER — METHOCARBAMOL 500 MG PO TABS: 500.0000 mg | ORAL_TABLET | Freq: Three times a day (TID) | ORAL | Status: DC | PRN

## 2015-05-21 MED ORDER — HEPARIN SOD (PORK) LOCK FLUSH 100 UNIT/ML IV SOLN
250.0000 [IU] | INTRAVENOUS | Status: AC | PRN
  Administered 2015-05-21: 500 [IU]

## 2015-05-21 MED ORDER — FAT EMULSION 20 % IV EMUL
240.0000 mL | INTRAVENOUS | Status: DC
  Filled 2015-05-21: qty 250

## 2015-05-21 NOTE — Clinical Social Work Note (Addendum)
Clinical Social Work Assessment  Patient Details  Name: Marissa [name] MRN: [id number] Date of Birth: 1941-04-10  Date of referral:  05/20/15               Reason for consult:  Facility Placement                Permission sought to share information with:  Family Supports Permission granted to share information::  Yes, Verbal Permission Granted  Name::        Agency::  SNF admissions  Relationship::     Contact Information:     Housing/Transportation Living arrangements for the past 2 months:  Single Family Home Source of Information:  Patient Patient Interpreter Needed:  None Criminal Activity/Legal Involvement Pertinent to Current Situation/Hospitalization:  No - Comment as needed Significant Relationships:  Adult Children Lives with:  Adult Children Do you feel safe going back to the place where you live?  Yes Need for family participation in patient care:  No (Coment)  Care giving concerns:  Patient feels that she can take care of her wound and TPN at home, and does not want to go to SNF unless she has to.   Social Worker assessment / plan:  Patient is a pleasant and talkative 74 year old female who lives with her daughter.  Patient is alert and oriented x4 and is hopeful she can return back home.  Patient expressed that she does not want to go to a SNF unelss she has to.  Patient stated she feels her daughter will be able to help her out with the wound care and TPN.  Patient stated she has had the TPN for awhile, and was using it before she was hospitalized.  Patient expressed that she had days while she has been at the hospital where she was depressed because she can not leave, the hospital, and she has to just stay in the room.  Patient expressed that once she goes home, at least she can go on her porch and get some fresh air when she wants.  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  Patient is planning to go home with home health and not going to  a SNF.    Employment status:  Retired Nurse, adult PT Recommendations:  No Follow Up Information / Referral to community resources:     Patient/Family's Response to care:  Patient does not want to go SNF  Patient/Family's Understanding of and Emotional Response to Diagnosis, Current Treatment, and Prognosis:  Patient expressed that she is aware of her prognosis, and she feels she will do better at home verse going to SNF.  Patient states her daughter can help take care of her while she is at home, administer TPN, and empty drain from wound.  Emotional Assessment Appearance:  Appears stated age Attitude/Demeanor/Rapport:    Affect (typically observed):  Calm, Stable, Pleasant, Flat Orientation:  Oriented to Self, Oriented to Place, Oriented to  Time, Oriented to Situation Alcohol / Substance use:  Not Applicable Psych involvement (Current and /or in the community):  No (Comment)  Discharge Needs  Concerns to be addressed:  No discharge needs identified Readmission within the last 30 days:  No Current discharge risk:  None Barriers to Discharge:  No Barriers Identified   Ross Ludwig, LCSWA 05/21/2015, 8:37 AM

## 2015-05-21 NOTE — Care Management Important Message (Signed)
Important Message  Patient Details  Name: Marissa [name] MRN: [id number] Date of Birth: [birth year]   Medicare Important Message Given:  Yes-fourth notification given    Delorse Lek 05/21/2015, 1:48 PM

## 2015-05-21 NOTE — Discharge Summary (Signed)
ADDENDUM:  Date of Discharge: 05-21-15  Please see discharge summary from yesterday for details of the hospitalization.  Update: The patient began to have significant decrease in her output and did not want to go to SNF any longer.  Her suction was removed and she tolerated her output to gravity only.  Her sandostatin was discontinued as well as her insurance company denied this.  She is stable today on 05-21-15 for DC home with [facility] for cyclic TNA and routine Eakin's pouch care.  She is otherwise to remain NPO x ice chips.  Follow up: Follow-up Information    Follow up with Maia Petties., MD On 06/01/2015.   Specialty:  General Surgery   Why:  8:40am, arrive by 8:25am for sign in   Contact information:   [address] [place]  [phone]       Henreitta Cea 8:42 AM 05/21/2015

## 2015-05-21 NOTE — Clinical Social Work Note (Signed)
CSW received referral for SNF.  Case discussed with patient and case manager and plan is to discharge home with home health.  CSW to sign off please re-consult if social work needs arise.  Jones Broom. [place], MSW, [place]

## 2015-05-21 NOTE — Progress Notes (Signed)
Marissa [name] to be D/C'd Home with Home Health per MD order.  Discussed with the patient and all questions fully answered.  VSS, Skin clean, dry and intact without evidence of skin break down, no evidence of skin tears noted. PICC line flushed and capped for home use.  An After Visit Summary was printed and given to the patient. Patient received prescriptions.  D/c education completed with patient/family including follow up instructions, medication list, d/c activities limitations if indicated, with other d/c instructions as indicated by MD - patient able to verbalize understanding, all questions fully answered.   Patient instructed to return to ED, call 911, or call MD for any changes in condition.   Patient escorted via [place], and D/C home via private auto.  Micki Riley 05/21/2015 3:09 PM

## 2015-05-21 NOTE — Discharge Instructions (Signed)
PICC Home Guide A peripherally inserted central catheter (PICC) is a long, thin, flexible tube that is inserted into a vein in the upper arm. It is a form of intravenous (IV) access. It is considered to be a "central" line because the tip of the PICC ends in a large vein in your chest. This large vein is called the superior vena cava (SVC). The PICC tip ends in the SVC because there is a lot of blood flow in the SVC. This allows medicines and IV fluids to be quickly distributed throughout the body. The PICC is inserted using a sterile technique by a specially trained nurse or physician. After the PICC is inserted, a chest X-ray exam is done to be sure it is in the correct place.  A PICC may be placed for different reasons, such as:  To give medicines and liquid nutrition that can only be given through a central line. Examples are:  Certain antibiotic treatments.  Chemotherapy.  Total parenteral nutrition (TPN).  To take frequent blood samples.  To give IV fluids and blood products.  If there is difficulty placing a peripheral intravenous (PIV) catheter. If taken care of properly, a PICC can remain in place for several months. A PICC can also allow a person to go home from the hospital early. Medicine and PICC care can be managed at home by a family member or home health care team. WHAT PROBLEMS CAN HAPPEN WHEN I HAVE A PICC? Problems with a PICC can occasionally occur. These may include the following:  A blood clot (thrombus) forming in or at the tip of the PICC. This can cause the PICC to become clogged. A clot-dissolving medicine called tissue plasminogen activator (tPA) can be given through the PICC to help break up the clot.  Inflammation of the vein (phlebitis) in which the PICC is placed. Signs of inflammation may include redness, pain at the insertion site, red streaks, or being able to feel a "cord" in the vein where the PICC is located.  Infection in the PICC or at the insertion  site. Signs of infection may include fever, chills, redness, swelling, or pus drainage from the PICC insertion site.  PICC movement (malposition). The PICC tip may move from its original position due to excessive physical activity, forceful coughing, sneezing, or vomiting.  A break or cut in the PICC. It is important to not use scissors near the PICC.  Nerve or tendon irritation or injury during PICC insertion. WHAT SHOULD I KEEP IN MIND ABOUT ACTIVITIES WHEN I HAVE A PICC?  You may bend your arm and move it freely. If your PICC is near or at the bend of your elbow, avoid activity with repeated motion at the elbow.  Rest at home for the remainder of the day following PICC line insertion.  Avoid lifting heavy objects as instructed by your health care provider.  Avoid using a crutch with the arm on the same side as your PICC. You may need to use a walker. WHAT SHOULD I KNOW ABOUT MY PICC DRESSING?  Keep your PICC bandage (dressing) clean and dry to prevent infection.  Ask your health care provider when you may shower. Ask your health care provider to teach you how to wrap the PICC when you do take a shower.  Change the PICC dressing as instructed by your health care provider.  Change your PICC dressing if it becomes loose or wet. WHAT SHOULD I KNOW ABOUT PICC CARE?  Check the PICC insertion site   daily for leakage, redness, swelling, or pain.  Do not take a bath, swim, or use hot tubs when you have a PICC. Cover PICC line with clear plastic wrap and tape to keep it dry while showering.  Flush the PICC as directed by your health care provider. Let your health care provider know right away if the PICC is difficult to flush or does not flush. Do not use force to flush the PICC.  Do not use a syringe that is less than 10 mL to flush the PICC.  Never pull or tug on the PICC.  Avoid blood pressure checks on the arm with the PICC.  Keep your PICC identification card with you at all  times.  Do not take the PICC out yourself. Only a trained clinical professional should remove the PICC. SEEK IMMEDIATE MEDICAL CARE IF:  Your PICC is accidentally pulled all the way out. If this happens, cover the insertion site with a bandage or gauze dressing. Do not throw the PICC away. Your health care provider will need to inspect it.  Your PICC was tugged or pulled and has partially come out. Do not  push the PICC back in.  There is any type of drainage, redness, or swelling where the PICC enters the skin.  You cannot flush the PICC, it is difficult to flush, or the PICC leaks around the insertion site when it is flushed.  You hear a "flushing" sound when the PICC is flushed.  You have pain, discomfort, or numbness in your arm, shoulder, or jaw on the same side as the PICC.  You feel your heart "racing" or skipping beats.  You notice a hole or tear in the PICC.  You develop chills or a fever. MAKE SURE YOU:   Understand these instructions.  Will watch your condition.  Will get help right away if you are not doing well or get worse. Document Released: 03/05/2003 Document Revised: 01/13/2014 Document Reviewed: 05/06/2013 [facility] Patient Information 2015 [place], [place]. This information is not intended to replace advice given to you by your health care provider. Make sure you discuss any questions you have with your health care provider.  -Routine Eakin's pouch care

## 2015-05-21 NOTE — Consult Note (Addendum)
WOC follow-up: Eakin pouch has been off suction for 24 hours.  Pt states she has been emptying in the bathroom periodically and the pouch began leaking last night, the bedside nurse changed at that time. Eakin pouch currently intact with good seal to abd fistula wound with small amt green drainage.  Pt plans to discharge home with home health assistance; discussed supply needs with [place] via phone call. 3 sets of small Eakin pouches and barrier rings at the bedside and instructions are available for bedside nurses if leakage occurs. Pt is well-informed regarding pouching application process and denies further questions at this time. Julien Girt MSN, RN, [place], [place], [place]

## 2015-05-21 NOTE — Progress Notes (Signed)
PARENTERAL NUTRITION CONSULT NOTE - FOLLOW UP  Pharmacy Consult for TPN Indication: EC fistula  No Known Allergies  Patient Measurements: Height: 5\' 4"  (162.6 cm) Weight: 218 lb 14.7 oz (99.3 kg) IBW/kg (Calculated) : 54.7  Weight 99.3 kg  Vital Signs: Temp: 98.6 F (37 C) (09/08 0508) Temp Source: Oral (09/08 0508) BP: 128/61 mmHg (09/08 0508) Pulse Rate: 100 (09/08 0508) Intake/Output from previous day: 09/07 0701 - 09/08 0700 In: 0  Out: 700 [Urine:650; Drains:50] Intake/Output from this shift: Total I/O In: -  Out: 250 [Urine:250] Labs: No results for input(s): WBC, HGB, HCT, PLT, APTT, INR in the last 72 hours.  Recent Labs  05/21/15 0530  NA 133*  K 4.4  CL 103  CO2 22  GLUCOSE 116*  BUN 48*  CREATININE 1.45*  CALCIUM 8.7*  MG 1.9  PHOS 3.7  PROT 6.1*  ALBUMIN 2.5*  AST 26  ALT 32  ALKPHOS 95  BILITOT 0.5   Estimated Creatinine Clearance: 39 mL/min (by C-G formula based on Cr of 1.45).   Recent Labs  05/20/15 1200 05/20/15 1954 05/20/15 2314  GLUCAP 129* 185* 169*     Insulin Requirements while Cyclic TPN infusing: 8 units of SSI  Nutritional Goals: per RD on 9/1 Protein: 100-110 g  Kcal: 1700-1900  Fluid: 1.7-1.9 L  Current Nutrition:  NPO Current Cyclic Clinimix E 7/48: 82ml/hr x 1 hour, 165 ml/hr x 10 hour, 75 ml/hr x 1 + 20% IVFE at 20 ml/hr x 12h provides 1758 kcal and 90 grams protein (meeting 100% kcal and 90% protein goals) (Chronic TPN PTA per [place] kcal, 85g protein)   Assessment: Continue chronic home TPN. 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 Sandostatin added on 8/22 to help with closure and increased on 8/24 & 9/1 - current dose is 300 mcg q8h. Drain output/24h: 700 cc. Octreotide was stopped on 9/8 as insurance will not pay for it when  patient leaves but it has already been ~2 weeks so we may not see much more benefit even if it was continued. Surgery also considering placing NGT to help decrease drainage. No N/V or abdominal pain. Does have some flatus and last BM was 8/22.Continue to control drainage in hopes that fistula will spontaneously close. May take a month for fistula to close so planning d/c to SNF ([facility]) once bed available which could be today or tomorrow. IV PPI for h/o GERD, po B12. Prealbumin on 9/5 - IP, 26.5 (8/29), alb 2.5  Endo: Hx gout: left ankle pain/acute gouty flare s/p prednisone taper. Hx DM: CBGs off TPN 100-120s, on TPN are elevated at 160-180s.  Lytes: Lytes wnl exc Na 133. Phos and Mg ok. CoCa 9.7  Renal: Hx CKD3, SCr 1.45, CrCl~40 ml/min, UOP stable at  0.52ml/kg/hr yesterday. I/O not charted correctly, about equal yesterday  Pulm: 100% RA, OSA  Cards: Hx HTN/HF/DL/CAD. BP/HR wnl. On lopressor (norvasc held)  Hepatobil: Albumin 2.5, last prealbumin 23.8 (up from admission) LFTs wnl, TG 136 (9/5), Tbili 0.5.   Neuro: A&O  ID: Afebrile, WBC WNL, no abx  Anticoag: h/o DVT-not on anticoagulation secondary to recurrent diverticular and gastric bleeding and iron deficiency anemia (po iron). Lovenox 40mg /d. Hgb down to 7.8 with chronic anemia  Best Practices: PPI IV, CHX, Bactroban, LMWH  TPN Access: PICC TPN start date: chronic  Plan:  Continue cyclic Clinimix E 3/64: Total volume of 1800 mL over 12 hours. 29ml/hr x 1 hour, 165 ml/hr x 10 hours, 73ml/hr x 1 hour   Continue cyclic 68% IVFE at 20 ml/hr x 12 hours. TPN + IVFE will provide 1758 kcal (100% of needs) and 90 g of protein (90% of needs)  Continue MVI and trace elements in TPN Continue moderate SSI Monitor TPN labs Mon/Thurs, drain output  Elenor Quinones, PharmD Clinical Pharmacist Pager [phone] 05/21/2015 8:23 AM

## 2015-05-21 NOTE — Clinical Social Work Placement (Signed)
   CLINICAL SOCIAL WORK PLACEMENT  NOTE  Date:  05/21/2015  Patient Details  Name: Marissa [name] MRN: [id number] Date of Birth: [birth year]  Clinical Social Work is seeking post-discharge placement for this patient at the [place] level of care (*CSW will initial, date and re-position this form in  chart as items are completed):  Yes   Patient/family provided with [place] Work Department's list of facilities offering this level of care within the geographic area requested by the patient (or if unable, by the patient's family).  Yes   Patient/family informed of their freedom to choose among providers that offer the needed level of care, that participate in Medicare, Medicaid or managed care program needed by the patient, have an available bed and are willing to accept the patient.  Yes   Patient/family informed of 's ownership interest in [facility] and [facility], as well as of the fact that they are under no obligation to receive care at these facilities.  PASRR submitted to EDS on 05/20/15     PASRR number received on 05/20/15     Existing PASRR number confirmed on       FL2 transmitted to all facilities in geographic area requested by pt/family on 05/20/15     FL2 transmitted to all facilities within larger geographic area on       Patient informed that his/her managed care company has contracts with or will negotiate with certain facilities, including the following:        Yes   Patient/family informed of bed offers received.  Patient chooses bed at       Physician recommends and patient chooses bed at      Patient to be transferred to   on  .  Patient to be transferred to facility by       Patient family notified on   of transfer.  Name of family member notified:        PHYSICIAN       Additional Comment:    _______________________________________________ Ross Ludwig, Deputy 05/21/2015, 9:54 AM

## 2015-05-26 ENCOUNTER — Other Ambulatory Visit: Payer: Self-pay | Admitting: Internal Medicine

## 2015-05-26 ENCOUNTER — Telehealth: Payer: Self-pay | Admitting: Internal Medicine

## 2015-05-26 DIAGNOSIS — L02211 Cutaneous abscess of abdominal wall: Secondary | ICD-10-CM

## 2015-05-26 DIAGNOSIS — K632 Fistula of intestine: Secondary | ICD-10-CM

## 2015-05-26 DIAGNOSIS — F0390 Unspecified dementia without behavioral disturbance: Secondary | ICD-10-CM

## 2015-05-26 NOTE — Telephone Encounter (Signed)
Receive call from daughter concern about getting referral for mom to go to [facility]. She was d/c from hospital on 9/8, and insurance company to them that the hospital called [facility] and close the case. Insurance telling them that if she get referral from PCP that she can go.Inform Marissa [name] below that md ok the referral. Will receive call from our [facility] once everything been set-up.Marland KitchenJohny [name]

## 2015-05-26 NOTE — Telephone Encounter (Signed)
The patient's daughter called back and said she talked to the insurance company and they said they would approve the referral only if it says [place] on it.

## 2015-05-26 NOTE — Telephone Encounter (Signed)
Patient's daughter wants a referral to [facility] for care after her surgery.

## 2015-05-26 NOTE — Telephone Encounter (Signed)
Referral ordered

## 2015-05-26 NOTE — Telephone Encounter (Signed)
done

## 2015-05-26 NOTE — Addendum Note (Signed)
Addended by: Janith Lima on: 05/26/2015 12:11 PM   Modules accepted: Orders

## 2015-05-27 ENCOUNTER — Inpatient Hospital Stay: Payer: Medicare Other | Admitting: Internal Medicine

## 2015-05-31 ENCOUNTER — Other Ambulatory Visit: Payer: Self-pay | Admitting: Cardiology

## 2015-05-31 ENCOUNTER — Other Ambulatory Visit: Payer: Self-pay | Admitting: Family

## 2015-06-02 NOTE — Telephone Encounter (Signed)
4. Chronic diastolic CHF: EF normal on 10/15 echo. NYHA class III symptoms still but volume appears better. No JVD.  - Continue Lasix 20 mg daily with KCl 20 mEq daily.  Larey Dresser, MD at 07/06/2014 9:07 PM  furosemide (LASIX) 20 MG tabletTake 1 tablet (20 mg total) by mouth daily

## 2015-06-04 ENCOUNTER — Emergency Department (HOSPITAL_COMMUNITY): Payer: Medicare Other

## 2015-06-04 ENCOUNTER — Encounter (HOSPITAL_COMMUNITY): Payer: Self-pay

## 2015-06-04 ENCOUNTER — Inpatient Hospital Stay (HOSPITAL_COMMUNITY)
Admission: EM | Admit: 2015-06-04 | Discharge: 2015-06-16 | DRG: 682 | Disposition: A | Discharge status: Skilled Nursing Facility | Payer: Medicare Other | Attending: Internal Medicine | Admitting: Internal Medicine

## 2015-06-04 DIAGNOSIS — G9340 Encephalopathy, unspecified: Secondary | ICD-10-CM | POA: Diagnosis present

## 2015-06-04 DIAGNOSIS — R4182 Altered mental status, unspecified: Secondary | ICD-10-CM | POA: Diagnosis present

## 2015-06-04 DIAGNOSIS — Z86718 Personal history of other venous thrombosis and embolism: Secondary | ICD-10-CM | POA: Diagnosis not present

## 2015-06-04 DIAGNOSIS — Z79899 Other long term (current) drug therapy: Secondary | ICD-10-CM

## 2015-06-04 DIAGNOSIS — N170 Acute kidney failure with tubular necrosis: Secondary | ICD-10-CM | POA: Diagnosis present

## 2015-06-04 DIAGNOSIS — N179 Acute kidney failure, unspecified: Secondary | ICD-10-CM | POA: Diagnosis present

## 2015-06-04 DIAGNOSIS — R1013 Epigastric pain: Secondary | ICD-10-CM | POA: Diagnosis not present

## 2015-06-04 DIAGNOSIS — R1310 Dysphagia, unspecified: Secondary | ICD-10-CM | POA: Diagnosis not present

## 2015-06-04 DIAGNOSIS — R1033 Periumbilical pain: Secondary | ICD-10-CM

## 2015-06-04 DIAGNOSIS — R479 Unspecified speech disturbances: Secondary | ICD-10-CM

## 2015-06-04 DIAGNOSIS — R627 Adult failure to thrive: Secondary | ICD-10-CM | POA: Diagnosis not present

## 2015-06-04 DIAGNOSIS — E1142 Type 2 diabetes mellitus with diabetic polyneuropathy: Secondary | ICD-10-CM | POA: Diagnosis present

## 2015-06-04 DIAGNOSIS — Z981 Arthrodesis status: Secondary | ICD-10-CM | POA: Diagnosis not present

## 2015-06-04 DIAGNOSIS — M109 Gout, unspecified: Secondary | ICD-10-CM | POA: Diagnosis present

## 2015-06-04 DIAGNOSIS — I5032 Chronic diastolic (congestive) heart failure: Secondary | ICD-10-CM | POA: Diagnosis not present

## 2015-06-04 DIAGNOSIS — G4733 Obstructive sleep apnea (adult) (pediatric): Secondary | ICD-10-CM | POA: Diagnosis not present

## 2015-06-04 DIAGNOSIS — I129 Hypertensive chronic kidney disease with stage 1 through stage 4 chronic kidney disease, or unspecified chronic kidney disease: Secondary | ICD-10-CM | POA: Diagnosis not present

## 2015-06-04 DIAGNOSIS — E876 Hypokalemia: Secondary | ICD-10-CM | POA: Diagnosis not present

## 2015-06-04 DIAGNOSIS — E1122 Type 2 diabetes mellitus with diabetic chronic kidney disease: Secondary | ICD-10-CM | POA: Diagnosis not present

## 2015-06-04 DIAGNOSIS — R109 Unspecified abdominal pain: Secondary | ICD-10-CM | POA: Diagnosis present

## 2015-06-04 DIAGNOSIS — K632 Fistula of intestine: Secondary | ICD-10-CM | POA: Diagnosis not present

## 2015-06-04 DIAGNOSIS — R7989 Other specified abnormal findings of blood chemistry: Secondary | ICD-10-CM

## 2015-06-04 DIAGNOSIS — D500 Iron deficiency anemia secondary to blood loss (chronic): Secondary | ICD-10-CM | POA: Diagnosis not present

## 2015-06-04 DIAGNOSIS — Z933 Colostomy status: Secondary | ICD-10-CM | POA: Diagnosis not present

## 2015-06-04 DIAGNOSIS — K219 Gastro-esophageal reflux disease without esophagitis: Secondary | ICD-10-CM | POA: Diagnosis present

## 2015-06-04 DIAGNOSIS — D638 Anemia in other chronic diseases classified elsewhere: Secondary | ICD-10-CM | POA: Diagnosis not present

## 2015-06-04 DIAGNOSIS — I2510 Atherosclerotic heart disease of native coronary artery without angina pectoris: Secondary | ICD-10-CM | POA: Diagnosis present

## 2015-06-04 DIAGNOSIS — Z789 Other specified health status: Secondary | ICD-10-CM | POA: Diagnosis not present

## 2015-06-04 DIAGNOSIS — E46 Unspecified protein-calorie malnutrition: Secondary | ICD-10-CM | POA: Diagnosis not present

## 2015-06-04 DIAGNOSIS — E872 Acidosis: Secondary | ICD-10-CM | POA: Diagnosis not present

## 2015-06-04 DIAGNOSIS — E785 Hyperlipidemia, unspecified: Secondary | ICD-10-CM | POA: Diagnosis present

## 2015-06-04 DIAGNOSIS — R112 Nausea with vomiting, unspecified: Secondary | ICD-10-CM

## 2015-06-04 DIAGNOSIS — E860 Dehydration: Secondary | ICD-10-CM | POA: Diagnosis present

## 2015-06-04 DIAGNOSIS — Z955 Presence of coronary angioplasty implant and graft: Secondary | ICD-10-CM

## 2015-06-04 DIAGNOSIS — Z6839 Body mass index (BMI) 39.0-39.9, adult: Secondary | ICD-10-CM | POA: Diagnosis not present

## 2015-06-04 DIAGNOSIS — N183 Chronic kidney disease, stage 3 (moderate): Secondary | ICD-10-CM | POA: Diagnosis not present

## 2015-06-04 LAB — COMPREHENSIVE METABOLIC PANEL
ALT: 108 U/L — AB (ref 14–54)
ANION GAP: 12 (ref 5–15)
AST: 54 U/L — ABNORMAL HIGH (ref 15–41)
Albumin: 2.9 g/dL — ABNORMAL LOW (ref 3.5–5.0)
Alkaline Phosphatase: 114 U/L (ref 38–126)
BUN: 130 mg/dL — ABNORMAL HIGH (ref 6–20)
CHLORIDE: 120 mmol/L — AB (ref 101–111)
CO2: 6 mmol/L — AB (ref 22–32)
CREATININE: 2.13 mg/dL — AB (ref 0.44–1.00)
Calcium: 10.4 mg/dL — ABNORMAL HIGH (ref 8.9–10.3)
GFR calc non Af Amer: 22 mL/min — ABNORMAL LOW (ref >60)
GFR, EST AFRICAN AMERICAN: 25 mL/min — AB (ref >60)
Glucose, Bld: 111 mg/dL — ABNORMAL HIGH (ref 65–99)
POTASSIUM: 5.8 mmol/L — AB (ref 3.5–5.1)
SODIUM: 138 mmol/L (ref 135–145)
Total Bilirubin: 0.2 mg/dL — ABNORMAL LOW (ref 0.3–1.2)
Total Protein: 7.5 g/dL (ref 6.5–8.1)

## 2015-06-04 LAB — CBC WITH DIFFERENTIAL/PLATELET
Basophils Absolute: 0 10*3/uL (ref 0.0–0.1)
Basophils Relative: 0 %
Eosinophils Absolute: 0 10*3/uL (ref 0.0–0.7)
Eosinophils Relative: 0 %
HEMATOCRIT: 27.6 % — AB (ref 36.0–46.0)
HEMOGLOBIN: 8.9 g/dL — AB (ref 12.0–15.0)
LYMPHS ABS: 0.9 10*3/uL (ref 0.7–4.0)
LYMPHS PCT: 8 %
MCH: 22.9 pg — AB (ref 26.0–34.0)
MCHC: 32.2 g/dL (ref 30.0–36.0)
MCV: 71.1 fL — ABNORMAL LOW (ref 78.0–100.0)
MONOS PCT: 12 %
Monocytes Absolute: 1.3 10*3/uL — ABNORMAL HIGH (ref 0.1–1.0)
NEUTROS ABS: 9.2 10*3/uL — AB (ref 1.7–7.7)
NEUTROS PCT: 80 %
Platelets: 271 10*3/uL (ref 150–400)
RBC: 3.88 MIL/uL (ref 3.87–5.11)
RDW: 16.6 % — ABNORMAL HIGH (ref 11.5–15.5)
WBC: 11.9 10*3/uL — ABNORMAL HIGH (ref 4.0–10.5)

## 2015-06-04 LAB — URINALYSIS, ROUTINE W REFLEX MICROSCOPIC
Bilirubin Urine: NEGATIVE
GLUCOSE, UA: NEGATIVE mg/dL
HGB URINE DIPSTICK: NEGATIVE
Ketones, ur: NEGATIVE mg/dL
LEUKOCYTES UA: NEGATIVE
Nitrite: NEGATIVE
Protein, ur: 30 mg/dL — AB
SPECIFIC GRAVITY, URINE: 1.014 (ref 1.005–1.030)
Urobilinogen, UA: 0.2 mg/dL (ref 0.0–1.0)
pH: 5.5 (ref 5.0–8.0)

## 2015-06-04 LAB — I-STAT CHEM 8, ED
BUN: 133 mg/dL — AB (ref 6–20)
CALCIUM ION: 1.59 mmol/L — AB (ref 1.13–1.30)
CHLORIDE: 123 mmol/L — AB (ref 101–111)
Creatinine, Ser: 2.2 mg/dL — ABNORMAL HIGH (ref 0.44–1.00)
GLUCOSE: 112 mg/dL — AB (ref 65–99)
HCT: 28 % — ABNORMAL LOW (ref 36.0–46.0)
Hemoglobin: 9.5 g/dL — ABNORMAL LOW (ref 12.0–15.0)
POTASSIUM: 5.7 mmol/L — AB (ref 3.5–5.1)
SODIUM: 141 mmol/L (ref 135–145)
TCO2: 7 mmol/L (ref 0–100)

## 2015-06-04 LAB — URINE MICROSCOPIC-ADD ON

## 2015-06-04 LAB — I-STAT CG4 LACTIC ACID, ED
LACTIC ACID, VENOUS: 0.35 mmol/L — AB (ref 0.5–2.0)
LACTIC ACID, VENOUS: 0.96 mmol/L (ref 0.5–2.0)

## 2015-06-04 MED ORDER — ACETAMINOPHEN 325 MG PO TABS
650.0000 mg | ORAL_TABLET | Freq: Four times a day (QID) | ORAL | Status: DC | PRN
  Administered 2015-06-09 – 2015-06-16 (×3): 650 mg via ORAL
  Filled 2015-06-04 (×3): qty 2

## 2015-06-04 MED ORDER — ONDANSETRON HCL 4 MG/2ML IJ SOLN
4.0000 mg | Freq: Four times a day (QID) | INTRAMUSCULAR | Status: DC | PRN
  Administered 2015-06-13: 4 mg via INTRAVENOUS
  Filled 2015-06-04: qty 2

## 2015-06-04 MED ORDER — PANTOPRAZOLE SODIUM 40 MG PO TBEC
40.0000 mg | DELAYED_RELEASE_TABLET | Freq: Every day | ORAL | Status: DC
  Administered 2015-06-04 – 2015-06-05 (×2): 40 mg via ORAL
  Filled 2015-06-04 (×3): qty 1

## 2015-06-04 MED ORDER — ENOXAPARIN SODIUM 30 MG/0.3ML ~~LOC~~ SOLN
30.0000 mg | SUBCUTANEOUS | Status: DC
  Filled 2015-06-04: qty 0.3

## 2015-06-04 MED ORDER — ONDANSETRON HCL 4 MG PO TABS: 4.0000 mg | ORAL_TABLET | Freq: Four times a day (QID) | ORAL | Status: DC | PRN

## 2015-06-04 MED ORDER — ENOXAPARIN SODIUM 30 MG/0.3ML ~~LOC~~ SOLN
30.0000 mg | SUBCUTANEOUS | Status: DC
  Administered 2015-06-04 – 2015-06-09 (×4): 30 mg via SUBCUTANEOUS
  Filled 2015-06-04 (×6): qty 0.3

## 2015-06-04 MED ORDER — METHOCARBAMOL 500 MG PO TABS
500.0000 mg | ORAL_TABLET | Freq: Three times a day (TID) | ORAL | Status: DC | PRN
  Administered 2015-06-09: 500 mg via ORAL
  Filled 2015-06-04: qty 1

## 2015-06-04 MED ORDER — OXYCODONE HCL 5 MG PO TABS
5.0000 mg | ORAL_TABLET | Freq: Four times a day (QID) | ORAL | Status: DC | PRN
  Administered 2015-06-04 – 2015-06-05 (×2): 5 mg via ORAL
  Filled 2015-06-04 (×3): qty 1

## 2015-06-04 MED ORDER — IOHEXOL 300 MG/ML  SOLN
25.0000 mL | Freq: Once | INTRAMUSCULAR | Status: AC | PRN
  Administered 2015-06-04: 25 mL via ORAL

## 2015-06-04 MED ORDER — DEXTROSE-NACL 5-0.9 % IV SOLN
INTRAVENOUS | Status: AC
  Administered 2015-06-04 – 2015-06-05 (×3): via INTRAVENOUS

## 2015-06-04 MED ORDER — SODIUM CHLORIDE 0.9 % IV SOLN
Freq: Once | INTRAVENOUS | Status: AC
  Administered 2015-06-04: 17:00:00 via INTRAVENOUS

## 2015-06-04 MED ORDER — SODIUM CHLORIDE 0.9 % IV SOLN: INTRAVENOUS | Status: DC

## 2015-06-04 MED ORDER — ALUM & MAG HYDROXIDE-SIMETH 200-200-20 MG/5ML PO SUSP: 30.0000 mL | Freq: Four times a day (QID) | ORAL | Status: DC | PRN

## 2015-06-04 MED ORDER — AMLODIPINE BESYLATE 10 MG PO TABS
10.0000 mg | ORAL_TABLET | Freq: Every day | ORAL | Status: DC
  Administered 2015-06-04: 10 mg via ORAL
  Filled 2015-06-04: qty 1

## 2015-06-04 MED ORDER — NITROGLYCERIN 0.4 MG SL SUBL: 0.4000 mg | SUBLINGUAL_TABLET | SUBLINGUAL | Status: DC | PRN

## 2015-06-04 MED ORDER — SODIUM CHLORIDE 0.9 % IJ SOLN
10.0000 mL | INTRAMUSCULAR | Status: DC | PRN
  Administered 2015-06-04 – 2015-06-12 (×4): 10 mL
  Filled 2015-06-04 (×4): qty 40

## 2015-06-04 MED ORDER — ACETAMINOPHEN 650 MG RE SUPP: 650.0000 mg | Freq: Four times a day (QID) | RECTAL | Status: DC | PRN

## 2015-06-04 MED ORDER — METOPROLOL TARTRATE 25 MG PO TABS
25.0000 mg | ORAL_TABLET | Freq: Every morning | ORAL | Status: DC
  Administered 2015-06-04 – 2015-06-05 (×2): 25 mg via ORAL
  Filled 2015-06-04 (×2): qty 1

## 2015-06-04 MED ORDER — FUROSEMIDE 20 MG PO TABS: 20.0000 mg | ORAL_TABLET | Freq: Every day | ORAL | Status: DC | PRN

## 2015-06-04 MED ORDER — SODIUM CHLORIDE 0.9 % IV BOLUS (SEPSIS)
1000.0000 mL | Freq: Once | INTRAVENOUS | Status: AC
  Administered 2015-06-04: 1000 mL via INTRAVENOUS

## 2015-06-04 NOTE — ED Notes (Signed)
Ostomy Nurse at the bedside.

## 2015-06-04 NOTE — H&P (Signed)
History and Physical  Lurine Imel FGH:[id number] DOB: 11/15/1940 DOA: 06/04/2015  Referring physician: Dr Robina Ade, ED Resident PCP: [name], MD   Chief Complaint: Altered mental status, abdominal pain  HPI: Anjana Cheek is a 74 y.o. female  With hypertension, hyperlipidemia, GERD, coronary artery disease, stage III chronic kidney disease, history of heart failure, although last echocardiogram on 07/02/14 shows a normal EF of 55-60% and no wall motion abnormalities. The patient also has a complex surgical history: She is status post left hemicolectomy with left transverse colon to sigmoid colon anastomosis for diverticulitis, umbilical hernia repair, repair of incarcerated ventral incisional hernia in 2009, open abdominal hysterectomy in 2013, abdominal wound exploration of enterocutaneous fistula and abscess in 04/28/2015 by Dr.Tsuei.  Patient has had an ostomy that as high output. Patient was brought to the emergency department due to increased confusion: History is obtained by her daughter. The patient was unable to recognize her daughter this morning. Patient was brought to the department for evaluation. The patient's status is improved since being here after being given fluids. No other. Eating or provoking factors.   Review of Systems:   Pt complains of abdominal pain around her ostomy site.  Pt denies any fevers, chills, nausea, vomiting, diarrhea, constipation, shortness of breath, dyspnea on exertion, orthopnea, cough, wheezing, palpitations, headache, vision changes, lightheadedness, dizziness, melena, rectal bleeding.  Review of systems are otherwise negative  Past Medical History  Diagnosis Date  . Hypertension   . Hyperlipidemia   . History of DVT (deep vein thrombosis)   . History of colonic polyps   . Gout   . Polyarthritis   . Microcytic anemia   . GERD (gastroesophageal reflux disease)   . Esophagitis   . Hidradenitis suppurativa     s/p axillary sweat gland  removal  . H/O blood transfusion reaction     at [facility] hospital  . Diverticulitis   . Difficulty sleeping   . PMB (postmenopausal bleeding)     X 2 YRS  . Bowel trouble     OCCASIONAL BOWEL INCONTINENCE  . Fibroids     s/p TAH/BSO 07/2012  . CAD (coronary artery disease)     a. 8/07 had BMS to OM.  b. In-stent restenosis with later Promus DES to same site.  c. Lexiscan myoview in 1/13 showed EF 67%, no ischemia or infarction.  d. Echo (2/13) showed EF 55-60%, moderate LVH, mild MR.  e. Lex MV 6/14: no isch, EF 53%;  f. Echo 6/14: mild LVH, EF 55-60%, Gr 1 DD, MAC, mild MR, mild LAE  . Peripheral neuropathy   . Chronic diastolic CHF (congestive heart failure) 06/29/2014  . Renal insufficiency     baseline Cr ~ 1.3  . Chronic kidney disease (CKD), stage III (moderate)     Archie Endo 04/27/2015  . OSA on CPAP     "wear it sometimes" (04/27/2015)  . History of hiatal hernia   . CVELFYBO(175.1)     "weekly" (04/27/2015)  . Enterocutaneous fistula     "have had it 2-3 months now" 04/27/2015  . Type II diabetes mellitus     diet controlled: pt's daughter denies mother's hx of diabetes   Past Surgical History  Procedure Laterality Date  . Anterior cervical decomp/discectomy fusion    . Cervical laminectomy    . Umbilical hernia repair    . Axillary hidradenitis excision Bilateral   . Knee arthroscopy Right   . Colectomy Left 2004    left partial for perforation  .  Cholecystectomy    . Tubal ligation    . Coronary angioplasty with stent placement      of mid and distal circumflex coronary artery. 1st stent was 04/2006, with drug eluting stent placed on 12/20/07 (30-50% RCA stenosis). Cardiologist Dr. Einar Gip.  . Dilation and curettage of uterus  02/2011  . Hysteroscopy w/d&c  05/04/2012    Procedure: DILATATION AND CURETTAGE /HYSTEROSCOPY;  Surgeon: Lahoma Crocker, MD;  Location: [facility];  Service: Gynecology;  Laterality: N/A;  . Robotic assisted total hysterectomy with bilateral salpingo  oopherectomy  07/24/2012    Procedure: ROBOTIC ASSISTED TOTAL HYSTERECTOMY WITH BILATERAL SALPINGO OOPHORECTOMY;  Surgeon: Janie Morning, MD PHD;  Location: [facility];  Service: Gynecology;  Laterality: Bilateral;  attempted ,  converted to abdomnial hysterectomy  . Abdominal hysterectomy  07/24/2012    Procedure: HYSTERECTOMY ABDOMINAL;  Surgeon: Janie Morning, MD PHD;  Location: [facility];  Service: Gynecology;;  . Salpingoophorectomy  07/24/2012    Procedure: SALPINGO OOPHORECTOMY;  Surgeon: Janie Morning, MD PHD;  Location: [facility];  Service: Gynecology;  Laterality: Bilateral;  . Abdominal wound dehiscence  08/01/2012    Procedure: ABDOMINAL WOUND DEHISCENCE;  Surgeon: Lahoma Crocker, MD;  Location: [facility];  Service: Gynecology;  Laterality: N/A;  . Laparotomy  08/02/2012    Procedure: EXPLORATORY LAPAROTOMY;  Surgeon: Imogene Burn. Georgette Dover, MD;  Location: [facility];  Service: General;  Laterality: N/A;  placement of abdominal wound vac  . Colonoscopy N/A 12/22/2014    Procedure: COLONOSCOPY;  Surgeon: Juanita Craver, MD;  Location: [facility];  Service: Endoscopy;  Laterality: N/A;  . Esophagogastroduodenoscopy Left 01/13/2015    Procedure: ESOPHAGOGASTRODUODENOSCOPY (EGD);  Surgeon: Carol Ada, MD;  Location[facility];  Service: Endoscopy;  Laterality: Left;  . Back surgery    . Hernia repair    . Laparotomy N/A 04/28/2015    Procedure: Abdominal Wound Explororation, Explantation of Abdominal Mesh;  Surgeon: Donnie Mesa, MD;  Location: [place];  Service: General;  Laterality: N/A;  . Cardiac surgery     Social History:  reports that she has never smoked. She has never used smokeless tobacco. She reports that she does not drink alcohol or use illicit drugs. Patient lives at home & is able to participate in activities of daily living   No Known Allergies  Family History  Problem Relation Age of Onset  . Diabetes Mother   . Coronary artery disease Mother   . Diabetes Sister   . Coronary  artery disease Sister       Prior to Admission medications   Medication Sig Start Date End Date Taking? Authorizing Provider  amLODipine (NORVASC) 10 MG tablet Take 10 mg by mouth daily. 03/28/15  Yes Historical Provider, MD  cyanocobalamin 2000 MCG tablet Take 1 tablet (2,000 mcg total) by mouth daily. 01/07/15  Yes Janith Lima, MD  furosemide (LASIX) 20 MG tablet Take 20 mg by mouth daily as needed for fluid.  11/03/14  Yes Historical Provider, MD  methocarbamol (ROBAXIN) 500 MG tablet Take 1 tablet (500 mg total) by mouth every 8 (eight) hours as needed for muscle spasms. 05/21/15  Yes Saverio Danker, PA-C  metoprolol tartrate (LOPRESSOR) 25 MG tablet Take 1 tablet (25 mg total) by mouth every morning. 12/25/14  Yes Reyne Dumas, MD  oxyCODONE (OXY IR/ROXICODONE) 5 MG immediate release tablet Take 1 tablet (5 mg total) by mouth every 6 (six) hours as needed for moderate pain. 05/21/15  Yes Saverio Danker, PA-C  pantoprazole (PROTONIX) 40 MG tablet  Take 1 tablet (40 mg total) by mouth daily. 12/25/14  Yes Reyne Dumas, MD  colchicine 0.6 MG tablet TAKE ONE TABLET BY MOUTH AS NEEDED Patient not taking: Reported on 06/04/2015 06/01/15   [place], FNP  FeAsp-B12-FA-C-DSS-SuccAc-Zn (FERIVA 21/7) 75-1 MG TABS Take 1 tablet by mouth daily. 04/15/15   Janith Lima, MD  furosemide (LASIX) 20 MG tablet TAKE ONE TABLET BY MOUTH DAILY Patient not taking: Reported on 06/04/2015 06/02/15   Larey Dresser, MD  NITROSTAT 0.4 MG SL tablet DISSOLVE ONE TABLET UNDER THE TONGUE EVERY 5 MINUTES AS NEEDED FOR CHEST PAIN.  DO NOT EXCEED A TOTAL OF 3 DOSES IN 15 MINUTES 03/30/15   Larey Dresser, MD  VOLTAREN 1 % GEL Apply 2 g topically 4 (four) times daily as needed (pain).  12/25/14   Historical Provider, MD    Physical Exam: BP 115/73 mmHg  Pulse 106  Temp(Src) 99.1 F (37.3 C) (Rectal)  Resp 17  Ht 5\' 4"  (1.626 m)  Wt 102.059 kg (225 lb)  BMI 38.60 kg/m2  SpO2 100%  General: Older black woman. Awake and  alert and oriented x3. No acute cardiopulmonary distress.  Eyes: Pupils equal, round, reactive to light. Extraocular muscles are intact. Sclerae anicteric and noninjected.  ENT:  Dry mucosal membranes. No mucosal lesions.  Neck: Neck supple without lymphadenopathy. No carotid bruits. No masses palpated.  Cardiovascular: Regular rate with normal S1-S2 sounds. No murmurs, rubs, gallops auscultated. No JVD.  Respiratory: Good respiratory effort with no wheezes, rales, rhonchi. Lungs clear to auscultation bilaterally.  Abdomen: Soft, nontender, nondistended. Active bowel sounds. No masses or hepatosplenomegaly  Skin: Dry, warm to touch. 2+ dorsalis pedis and radial pulses. Musculoskeletal: No calf or leg pain. All major joints not erythematous nontender.  Psychiatric: Intact judgment and insight.  Neurologic: No focal neurological deficits. Cranial nerves II through XII are grossly intact.           Labs on Admission:  Basic Metabolic Panel:  Recent Labs Lab 06/04/15 1050 06/04/15 1113  NA 138 141  K 5.8* 5.7*  CL 120* 123*  CO2 6*  --   GLUCOSE 111* 112*  BUN 130* 133*  CREATININE 2.13* 2.20*  CALCIUM 10.4*  --    Liver Function Tests:  Recent Labs Lab 06/04/15 1050  AST 54*  ALT 108*  ALKPHOS 114  BILITOT 0.2*  PROT 7.5  ALBUMIN 2.9*   No results for input(s): LIPASE, AMYLASE in the last 168 hours. No results for input(s): AMMONIA in the last 168 hours. CBC:  Recent Labs Lab 06/04/15 1050 06/04/15 1113  WBC 11.9*  --   NEUTROABS 9.2*  --   HGB 8.9* 9.5*  HCT 27.6* 28.0*  MCV 71.1*  --   PLT 271  --    Cardiac Enzymes: No results for input(s): CKTOTAL, CKMB, CKMBINDEX, TROPONINI in the last 168 hours.  BNP (last 3 results)  Recent Labs  11/02/14 2340 03/19/15 1325  BNP 109.2* 141.6*    ProBNP (last 3 results) No results for input(s): PROBNP in the last 8760 hours.  CBG: No results for input(s): GLUCAP in the last 168 hours.  Radiological Exams  on Admission: Ct Abdomen Pelvis Wo Contrast  06/04/2015   CLINICAL DATA:  74 year old female with acute right abdominal and flank pain, nausea and vomiting for 1 week.  EXAM: CT ABDOMEN AND PELVIS WITHOUT CONTRAST  TECHNIQUE: Multidetector CT imaging of the abdomen and pelvis was performed following the standard protocol  without IV contrast.  COMPARISON:  05/06/2015 and prior CTs  FINDINGS: Please note that parenchymal abnormalities may be missed without intravenous contrast.  Lower chest:  Coronary artery calcifications again identified.  Hepatobiliary: The liver is unremarkable. Patient is status post cholecystectomy. There is no evidence biliary dilatation.  Pancreas: Unremarkable  Spleen: Unremarkable  Adrenals/Urinary Tract: Mild bilateral renal atrophy again identified. The kidneys, adrenal glands and bladder are otherwise unremarkable. There is no evidence of hydronephrosis or urinary calculi.  Stomach/Bowel: A small hiatal hernia is again noted. There is no evidence of bowel obstruction or focal bowel wall thickening. Descending and sigmoid colonic diverticulosis noted without evidence of diverticulitis.  Vascular/Lymphatic: Aortic atherosclerotic calcifications noted without aneurysm. No enlarged lymph nodes are identified.  Reproductive: The patient is status post hysterectomy. The adnexal regions are unremarkable.  Other: No free fluid or pneumoperitoneum. Periumbilical surgical changes are identified. There is no evidence of ventral hernia.  Musculoskeletal: No acute abnormalities identified. Multilevel mild degenerative disc disease and moderate facet arthropathy in the lumbar spine noted.  IMPRESSION: No evidence of acute abnormality. No evidence of urinary calculi or hydronephrosis.  Small hiatal hernia and mild bilateral renal atrophy again noted.  Multilevel degenerative changes within the lumbar spine.  Coronary artery disease and aortic atherosclerosis.   Electronically Signed   By: Margarette Canada  M.D.   On: 06/04/2015 12:09   Dg Chest Port 1 View  06/04/2015   CLINICAL DATA:  Sepsis.  Low-grade fever.  EXAM: PORTABLE CHEST 1 VIEW  COMPARISON:  04/27/2015  FINDINGS: Right PICC line tip:  SVC.  Atherosclerotic aortic arch. Thoracic spondylosis. Upper normal heart size.  The lungs appear clear.  IMPRESSION: 1. Atherosclerosis. 2. Right PICC line tip:  SVC.   Electronically Signed   By: Van Clines M.D.   On: 06/04/2015 10:55    EKG: Independently reviewed. Sinus tachycardia. Normal intervals. No acute ST elevation or depression.  Assessment/Plan Present on Admission:  . Dehydration . Encephalopathy . Abdominal pain . Acute renal failure  This patient was discussed with the ED physician, including pertinent vitals, physical exam findings, labs, and imaging.  We also discussed care given by the ED provider.  #1 encephalopathy  Improving  Admit  Likely secondary from dehydration and elevated BUN  No evidence of sepsis, UTI, or other infection. Ammonia level is normal #2 dehydration  Will fluid resuscitate #3 acute renal failure   Fluid resuscitate  Check BUN and creatinine in the morning #4 abdominal pain  Ostomy nurse has evaluated the patient - appears that there is a lot of skin breakdown around the incision site - unsure if this is because the patient's pain  Consult surgery in the morning - consult has been placed #5 diabetes  Patient is not on diabetic medications #6 hypertension   Continue home medications #7 ostomy  patient on TPN - will consult pharmacy  DVT prophylaxis: Lovenox  Consultants: Surgery  Code Status: Full code  Family Communication: Daughter - contact info in the chart   Disposition Plan: [place], [place] [facility] Pager [phone]

## 2015-06-04 NOTE — ED Notes (Signed)
Pt's daughter reports pt has had altered speech.

## 2015-06-04 NOTE — Progress Notes (Signed)
Called ED to get report Eliezer Lofts, [place] RN

## 2015-06-04 NOTE — ED Notes (Signed)
Spoke with Supply. TO bring patient's materials up for ostomy nurse.

## 2015-06-04 NOTE — ED Notes (Signed)
MD at bedside. 

## 2015-06-04 NOTE — ED Notes (Signed)
Per Pt daughter: reports colostomy placement approximately 2 months ago.  Pt had fistula surgery on the 15th of this month.  Pt has had fever the last few days. Pt has had vomiting since Saturday.  Pt has new onset confusion beginning this morning.  Pt has had abdominal pain.  Pt has been having problems identifying urge to urinate.

## 2015-06-04 NOTE — ED Notes (Signed)
Pt reported she had to urinate.  Pt thought she had urinated.  Pt did not produce any urine while on bedpan.  Urinated immediately after.

## 2015-06-04 NOTE — Progress Notes (Signed)
PARENTERAL NUTRITION CONSULT NOTE - INITIAL  Pharmacy Consult:  TPN Indication: EC fistula on home TPN  No Known Allergies  Patient Measurements: Height: 5\' 4"  (162.6 cm) Weight: 225 lb (102.059 kg) IBW/kg (Calculated) : 54.7  Vital Signs: Temp: 99.1 F (37.3 C) (09/22 1003) Temp Source: Rectal (09/22 1816) BP: 116/65 mmHg (09/22 1816) Pulse Rate: 108 (09/22 1816)  Labs:  Recent Labs  06/04/15 1050 06/04/15 1113  WBC 11.9*  --   HGB 8.9* 9.5*  HCT 27.6* 28.0*  PLT 271  --      Recent Labs  06/04/15 1050 06/04/15 1113  NA 138 141  K 5.8* 5.7*  CL 120* 123*  CO2 6*  --   GLUCOSE 111* 112*  BUN 130* 133*  CREATININE 2.13* 2.20*  CALCIUM 10.4*  --   PROT 7.5  --   ALBUMIN 2.9*  --   AST 54*  --   ALT 108*  --   ALKPHOS 114  --   BILITOT 0.2*  --    Estimated Creatinine Clearance: 26.1 mL/min (by C-G formula based on Cr of 2.2).   No results for input(s): GLUCAP in the last 72 hours.  Medical History: Past Medical History  Diagnosis Date  . Hypertension   . Hyperlipidemia   . History of DVT (deep vein thrombosis)   . History of colonic polyps   . Gout   . Polyarthritis   . Microcytic anemia   . GERD (gastroesophageal reflux disease)   . Esophagitis   . Hidradenitis suppurativa     s/p axillary sweat gland removal  . H/O blood transfusion reaction     at [facility]  . Diverticulitis   . Difficulty sleeping   . PMB (postmenopausal bleeding)     X 2 YRS  . Bowel trouble     OCCASIONAL BOWEL INCONTINENCE  . Fibroids     s/p TAH/BSO 07/2012  . CAD (coronary artery disease)     a. 8/07 had BMS to OM.  b. In-stent restenosis with later Promus DES to same site.  c. Lexiscan myoview in 1/13 showed EF 67%, no ischemia or infarction.  d. Echo (2/13) showed EF 55-60%, moderate LVH, mild MR.  e. Lex MV 6/14: no isch, EF 53%;  f. Echo 6/14: mild LVH, EF 55-60%, Gr 1 DD, MAC, mild MR, mild LAE  . Peripheral neuropathy   . Chronic diastolic CHF  (congestive heart failure) 06/29/2014  . Renal insufficiency     baseline Cr ~ 1.3  . Chronic kidney disease (CKD), stage III (moderate)     Archie Endo 04/27/2015  . OSA on CPAP     "wear it sometimes" (04/27/2015)  . History of hiatal hernia   . YBOFBPZW(258.5)     "weekly" (04/27/2015)  . Enterocutaneous fistula     "have had it 2-3 months now" 04/27/2015  . Type II diabetes mellitus     diet controlled: pt's daughter denies mother's hx of diabetes      Insulin Requirements in the past 24 hours:  N/A, just admitted  Assessment: 76 YOF with an extensive surgical history to continue on TPN for EC fistula.  CT shows no acute abnormality.  Patient is on cyclic TPN over 12 hours at home, and it is provided by [place].  Spoke to daughter who stated that patient's TPN supply ran out today so she is unable to provide the TPN content.  [facility] is currently closed.  Admit: AMS, abd pain  Surgeries/Procedures: hx left hemicolectomy with left transverse colon to sigmoid colon anastomosis for diverticulitis, umbilical hernia repair, repair of incarcerated ventral incisional hernia in 2009, open abd hysterectomy in 2013, abdominal wound exploration of EC fistula and abscess in 04/28/2015 GI: GERD / esophagitis / diverticulitis / occasional bowel incontinence / EC fistula Endo: gout.  DM2 - CBGs controlled Lytes: K+ 5.7, iCa 1.59 Renal: CKD - SCr 2.2 (baseline 1.23) - NS at 125 ml/hr Pulm: OSA on CPAP Cards: HTN / HLD / CAD / CHF (EF 55-60%) Hepatobil: AST/ALT elevated, tbili WNL Neuro: AMS improving ID: WBC 11.9 - not on abx Best Practices: Lovenox TPN Access: PICC line placed 04/27/15 TPN start date: 9/23 >>  Current Nutrition:  Home TPN + full liquid diet   Plan:  - [place] in AM to obtain TPN formula, then will resume TPN - Spoke to mid-level practitioner who will address IVF as patient is not getting TPN tonight (per protocol to start D10W at an  equivalent TPN rate), but is currently on NS at 125 ml/hr for hydration - TPN labs in AM   Thuy D. Mina Marble, PharmD, BCPS Pager:  [phone] 06/04/2015, 7:48 PM

## 2015-06-04 NOTE — Progress Notes (Signed)
Subjective: Patient well known to service, discharged a couple weeks ago, was seen by Dr Georgette Dover in office this week.  Has n/v, high output ecf.  Came in today with slurred speech and confusion. No real abd pain  Objective: Vital signs in last 24 hours: Temp:  [98.9 F (37.2 C)-99.1 F (37.3 C)] 98.9 F (37.2 C) (09/22 1936) Pulse Rate:  [100-108] 104 (09/22 1936) Resp:  [14-30] 18 (09/22 1936) BP: (101-140)/(50-75) 130/65 mmHg (09/22 1936) SpO2:  [100 %] 100 % (09/22 1936) Weight:  [102.059 kg (225 lb)] 102.059 kg (225 lb) (09/22 1003)    Intake/Output from previous day:   Intake/Output this shift:    GI: soft not really tender, fistula draining bilious output leaking inferiorly  Lab Results:   Recent Labs  06/04/15 1050 06/04/15 1113  WBC 11.9*  --   HGB 8.9* 9.5*  HCT 27.6* 28.0*  PLT 271  --    BMET  Recent Labs  06/04/15 1050 06/04/15 1113  NA 138 141  K 5.8* 5.7*  CL 120* 123*  CO2 6*  --   GLUCOSE 111* 112*  BUN 130* 133*  CREATININE 2.13* 2.20*  CALCIUM 10.4*  --    PT/INR No results for input(s): LABPROT, INR in the last 72 hours. ABG No results for input(s): PHART, HCO3 in the last 72 hours.  Invalid input(s): PCO2, PO2  Studies/Results: Ct Abdomen Pelvis Wo Contrast  06/04/2015   CLINICAL DATA:  74 year old female with acute right abdominal and flank pain, nausea and vomiting for 1 week.  EXAM: CT ABDOMEN AND PELVIS WITHOUT CONTRAST  TECHNIQUE: Multidetector CT imaging of the abdomen and pelvis was performed following the standard protocol without IV contrast.  COMPARISON:  05/06/2015 and prior CTs  FINDINGS: Please note that parenchymal abnormalities may be missed without intravenous contrast.  Lower chest:  Coronary artery calcifications again identified.  Hepatobiliary: The liver is unremarkable. Patient is status post cholecystectomy. There is no evidence biliary dilatation.  Pancreas: Unremarkable  Spleen: Unremarkable  Adrenals/Urinary  Tract: Mild bilateral renal atrophy again identified. The kidneys, adrenal glands and bladder are otherwise unremarkable. There is no evidence of hydronephrosis or urinary calculi.  Stomach/Bowel: A small hiatal hernia is again noted. There is no evidence of bowel obstruction or focal bowel wall thickening. Descending and sigmoid colonic diverticulosis noted without evidence of diverticulitis.  Vascular/Lymphatic: Aortic atherosclerotic calcifications noted without aneurysm. No enlarged lymph nodes are identified.  Reproductive: The patient is status post hysterectomy. The adnexal regions are unremarkable.  Other: No free fluid or pneumoperitoneum. Periumbilical surgical changes are identified. There is no evidence of ventral hernia.  Musculoskeletal: No acute abnormalities identified. Multilevel mild degenerative disc disease and moderate facet arthropathy in the lumbar spine noted.  IMPRESSION: No evidence of acute abnormality. No evidence of urinary calculi or hydronephrosis.  Small hiatal hernia and mild bilateral renal atrophy again noted.  Multilevel degenerative changes within the lumbar spine.  Coronary artery disease and aortic atherosclerosis.   Electronically Signed   By: Margarette Canada M.D.   On: 06/04/2015 12:09   Dg Chest Port 1 View  06/04/2015   CLINICAL DATA:  Sepsis.  Low-grade fever.  EXAM: PORTABLE CHEST 1 VIEW  COMPARISON:  04/27/2015  FINDINGS: Right PICC line tip:  SVC.  Atherosclerotic aortic arch. Thoracic spondylosis. Upper normal heart size.  The lungs appear clear.  IMPRESSION: 1. Atherosclerosis. 2. Right PICC line tip:  SVC.   Electronically Signed   By: Cindra Eves.D.  On: 06/04/2015 10:55    Anti-infectives: Anti-infectives    None      Assessment/Plan: ECF  No indication for any surgery at this point.  May very well be secondary to dehydration. Would continue with woc to hopefully manage wound.  Continue npo and tpn and all meds used at home.  I dont think she  will be able to return home at this point. We will follow with you.    [facility] 06/04/2015

## 2015-06-04 NOTE — ED Notes (Signed)
Phlebotomy at the bedside  

## 2015-06-04 NOTE — ED Provider Notes (Signed)
CSN: [id number]     Arrival date & time 06/04/15  9485 History   First MD Initiated Contact with Patient 06/04/15 [phone]     Chief Complaint  Patient presents with  . Abdominal Pain  . Emesis  . Weakness  . Altered Mental Status     (Consider location/radiation/quality/duration/timing/severity/associated sxs/prior Treatment) Patient is a 74 y.o. female presenting with abdominal pain.  Abdominal Pain Pain location:  Periumbilical Pain quality: sharp   Pain radiates to:  Does not radiate Pain severity:  Severe Onset quality:  Gradual Duration:  4 days Timing:  Constant Progression:  Worsening Chronicity:  New Context: previous surgery   Relieved by:  Nothing Worsened by:  Nothing tried Ineffective treatments: zofran. Associated symptoms: anorexia, chills, fever, nausea and vomiting   Associated symptoms: no chest pain, no constipation, no cough, no diarrhea, no dysuria, no shortness of breath and no sore throat   Risk factors: multiple surgeries     Past Medical History  Diagnosis Date  . Hypertension   . Hyperlipidemia   . History of DVT (deep vein thrombosis)   . History of colonic polyps   . Gout   . Polyarthritis   . Microcytic anemia   . GERD (gastroesophageal reflux disease)   . Esophagitis   . Hidradenitis suppurativa     s/p axillary sweat gland removal  . H/O blood transfusion reaction     at [facility]  . Diverticulitis   . Difficulty sleeping   . PMB (postmenopausal bleeding)     X 2 YRS  . Bowel trouble     OCCASIONAL BOWEL INCONTINENCE  . Fibroids     s/p TAH/BSO 07/2012  . CAD (coronary artery disease)     a. 8/07 had BMS to OM.  b. In-stent restenosis with later Promus DES to same site.  c. Lexiscan myoview in 1/13 showed EF 67%, no ischemia or infarction.  d. Echo (2/13) showed EF 55-60%, moderate LVH, mild MR.  e. Lex MV 6/14: no isch, EF 53%;  f. Echo 6/14: mild LVH, EF 55-60%, Gr 1 DD, MAC, mild MR, mild LAE  . Peripheral neuropathy   .  Chronic diastolic CHF (congestive heart failure) 06/29/2014  . Renal insufficiency     baseline Cr ~ 1.3  . Chronic kidney disease (CKD), stage III (moderate)     Archie Endo 04/27/2015  . OSA on CPAP     "wear it sometimes" (04/27/2015)  . History of hiatal hernia   . OJJKKXFG(182.9)     "weekly" (04/27/2015)  . Enterocutaneous fistula     "have had it 2-3 months now" 04/27/2015  . Type II diabetes mellitus     diet controlled: pt's daughter denies mother's hx of diabetes   Past Surgical History  Procedure Laterality Date  . Anterior cervical decomp/discectomy fusion    . Cervical laminectomy    . Umbilical hernia repair    . Axillary hidradenitis excision Bilateral   . Knee arthroscopy Right   . Colectomy Left 2004    left partial for perforation  . Cholecystectomy    . Tubal ligation    . Coronary angioplasty with stent placement      of mid and distal circumflex coronary artery. 1st stent was 04/2006, with drug eluting stent placed on 12/20/07 (30-50% RCA stenosis). Cardiologist Dr. Einar Gip.  . Dilation and curettage of uterus  02/2011  . Hysteroscopy w/d&c  05/04/2012    Procedure: DILATATION AND CURETTAGE /HYSTEROSCOPY;  Surgeon: Lahoma Crocker, MD;  Location:  [place] [facility];  Service: Gynecology;  Laterality: N/A;  . Robotic assisted total hysterectomy with bilateral salpingo oopherectomy  07/24/2012    Procedure: ROBOTIC ASSISTED TOTAL HYSTERECTOMY WITH BILATERAL SALPINGO OOPHORECTOMY;  Surgeon: Janie Morning, MD PHD;  Location: [facility];  Service: Gynecology;  Laterality: Bilateral;  attempted ,  converted to abdomnial hysterectomy  . Abdominal hysterectomy  07/24/2012    Procedure: HYSTERECTOMY ABDOMINAL;  Surgeon: Janie Morning, MD PHD;  Location: [facility];  Service: Gynecology;;  . Salpingoophorectomy  07/24/2012    Procedure: SALPINGO OOPHORECTOMY;  Surgeon: Janie Morning, MD PHD;  Location: [facility];  Service: Gynecology;  Laterality: Bilateral;  . Abdominal wound dehiscence  08/01/2012      Procedure: ABDOMINAL WOUND DEHISCENCE;  Surgeon: Lahoma Crocker, MD;  Location: [place] [facility];  Service: Gynecology;  Laterality: N/A;  . Laparotomy  08/02/2012    Procedure: EXPLORATORY LAPAROTOMY;  Surgeon: Imogene Burn. Georgette Dover, MD;  Location: [facility];  Service: General;  Laterality: N/A;  placement of abdominal wound vac  . Colonoscopy N/A 12/22/2014    Procedure: COLONOSCOPY;  Surgeon: Juanita Craver, MD;  Location: [facility];  Service: Endoscopy;  Laterality: N/A;  . Esophagogastroduodenoscopy Left 01/13/2015    Procedure: ESOPHAGOGASTRODUODENOSCOPY (EGD);  Surgeon: Carol Ada, MD;  Location: [facility]COPY;  Service: Endoscopy;  Laterality: Left;  . Back surgery    . Hernia repair    . Laparotomy N/A 04/28/2015    Procedure: Abdominal Wound Explororation, Explantation of Abdominal Mesh;  Surgeon: Donnie Mesa, MD;  Location: [facility];  Service: General;  Laterality: N/A;  . Cardiac surgery     Family History  Problem Relation Age of Onset  . Diabetes Mother   . Coronary artery disease Mother   . Diabetes Sister   . Coronary artery disease Sister    Social History  Substance Use Topics  . Smoking status: Never Smoker   . Smokeless tobacco: Never Used  . Alcohol Use: No   OB History    No data available     Review of Systems  Constitutional: Positive for fever, chills and appetite change.  HENT: Negative for congestion and sore throat.   Eyes: Negative for visual disturbance.  Respiratory: Negative for cough, shortness of breath and wheezing.   Cardiovascular: Negative for chest pain.  Gastrointestinal: Positive for nausea, vomiting, abdominal pain and anorexia. Negative for diarrhea and constipation.  Genitourinary: Negative for dysuria, difficulty urinating and vaginal pain.  Musculoskeletal: Negative for myalgias and arthralgias.  Skin: Negative for rash.  Neurological: Negative for syncope and headaches.  Psychiatric/Behavioral: Negative for behavioral problems.  All other  systems reviewed and are negative.     Allergies  Review of patient's allergies indicates no known allergies.  Home Medications   Prior to Admission medications   Medication Sig Start Date End Date Taking? Authorizing Provider  amLODipine (NORVASC) 10 MG tablet Take 10 mg by mouth daily. 03/28/15  Yes Historical Provider, MD  cyanocobalamin 2000 MCG tablet Take 1 tablet (2,000 mcg total) by mouth daily. 01/07/15  Yes Janith Lima, MD  furosemide (LASIX) 20 MG tablet Take 20 mg by mouth daily as needed for fluid.  11/03/14  Yes Historical Provider, MD  methocarbamol (ROBAXIN) 500 MG tablet Take 1 tablet (500 mg total) by mouth every 8 (eight) hours as needed for muscle spasms. 05/21/15  Yes Saverio Danker, PA-C  metoprolol tartrate (LOPRESSOR) 25 MG tablet Take 1 tablet (25 mg total) by mouth every morning. 12/25/14  Yes Reyne Dumas, MD  oxyCODONE (OXY  IR/ROXICODONE) 5 MG immediate release tablet Take 1 tablet (5 mg total) by mouth every 6 (six) hours as needed for moderate pain. 05/21/15  Yes Saverio Danker, PA-C  pantoprazole (PROTONIX) 40 MG tablet Take 1 tablet (40 mg total) by mouth daily. 12/25/14  Yes Reyne Dumas, MD  colchicine 0.6 MG tablet TAKE ONE TABLET BY MOUTH AS NEEDED Patient not taking: Reported on 06/04/2015 06/01/15   Golden Circle, FNP  FeAsp-B12-FA-C-DSS-SuccAc-Zn (FERIVA 21/7) 75-1 MG TABS Take 1 tablet by mouth daily. 04/15/15   Janith Lima, MD  furosemide (LASIX) 20 MG tablet TAKE ONE TABLET BY MOUTH DAILY Patient not taking: Reported on 06/04/2015 06/02/15   Larey Dresser, MD  NITROSTAT 0.4 MG SL tablet DISSOLVE ONE TABLET UNDER THE TONGUE EVERY 5 MINUTES AS NEEDED FOR CHEST PAIN.  DO NOT EXCEED A TOTAL OF 3 DOSES IN 15 MINUTES 03/30/15   Larey Dresser, MD  VOLTAREN 1 % GEL Apply 2 g topically 4 (four) times daily as needed (pain).  12/25/14   Historical Provider, MD   BP 101/54 mmHg  Pulse 100  Temp(Src) 99.1 F (37.3 C) (Rectal)  Resp 23  Ht 5\' 4"  (1.626 m)  Wt  225 lb (102.059 kg)  BMI 38.60 kg/m2  SpO2 100% Physical Exam  Constitutional: She is oriented to person, place, and time. She appears well-developed and well-nourished. No distress.  HENT:  Head: Normocephalic and atraumatic.  Eyes: EOM are normal.  Neck: Normal range of motion.  Cardiovascular: Regular rhythm and normal heart sounds.  Tachycardia present.   No murmur heard. Pulmonary/Chest: Effort normal and breath sounds normal. No respiratory distress. She has no wheezes.  Abdominal: Soft. Bowel sounds are normal. She exhibits no distension. There is generalized tenderness. There is no rebound and no guarding.    Musculoskeletal: She exhibits no edema.  Neurological: She is oriented to person, place, and time.  Skin: She is not diaphoretic.  Psychiatric: She has a normal mood and affect. Her behavior is normal.    ED Course  Procedures (including critical care time) Labs Review Labs Reviewed  COMPREHENSIVE METABOLIC PANEL - Abnormal; Notable for the following:    Potassium 5.8 (*)    Chloride 120 (*)    CO2 6 (*)    Glucose, Bld 111 (*)    BUN 130 (*)    Creatinine, Ser 2.13 (*)    Calcium 10.4 (*)    Albumin 2.9 (*)    AST 54 (*)    ALT 108 (*)    Total Bilirubin 0.2 (*)    GFR calc non Af Amer 22 (*)    GFR calc Af Amer 25 (*)    All other components within normal limits  CBC WITH DIFFERENTIAL/PLATELET - Abnormal; Notable for the following:    WBC 11.9 (*)    Hemoglobin 8.9 (*)    HCT 27.6 (*)    MCV 71.1 (*)    MCH 22.9 (*)    RDW 16.6 (*)    Neutro Abs 9.2 (*)    Monocytes Absolute 1.3 (*)    All other components within normal limits  URINALYSIS, ROUTINE W REFLEX MICROSCOPIC (NOT AT [facility]) - Abnormal; Notable for the following:    Protein, ur 30 (*)    All other components within normal limits  URINE MICROSCOPIC-ADD ON - Abnormal; Notable for the following:    Squamous Epithelial / LPF FEW (*)    Casts GRANULAR CAST (*)    All other components  within  normal limits  I-STAT CG4 LACTIC ACID, ED - Abnormal; Notable for the following:    Lactic Acid, Venous 0.35 (*)    All other components within normal limits  I-STAT CHEM 8, ED - Abnormal; Notable for the following:    Potassium 5.7 (*)    Chloride 123 (*)    BUN 133 (*)    Creatinine, Ser 2.20 (*)    Glucose, Bld 112 (*)    Calcium, Ion 1.59 (*)    Hemoglobin 9.5 (*)    HCT 28.0 (*)    All other components within normal limits  CULTURE, BLOOD (ROUTINE X 2)  CULTURE, BLOOD (ROUTINE X 2)  URINE CULTURE  I-STAT CG4 LACTIC ACID, ED    Imaging Review Ct Abdomen Pelvis Wo Contrast  06/04/2015   CLINICAL DATA:  74 year old female with acute right abdominal and flank pain, nausea and vomiting for 1 week.  EXAM: CT ABDOMEN AND PELVIS WITHOUT CONTRAST  TECHNIQUE: Multidetector CT imaging of the abdomen and pelvis was performed following the standard protocol without IV contrast.  COMPARISON:  05/06/2015 and prior CTs  FINDINGS: Please note that parenchymal abnormalities may be missed without intravenous contrast.  Lower chest:  Coronary artery calcifications again identified.  Hepatobiliary: The liver is unremarkable. Patient is status post cholecystectomy. There is no evidence biliary dilatation.  Pancreas: Unremarkable  Spleen: Unremarkable  Adrenals/Urinary Tract: Mild bilateral renal atrophy again identified. The kidneys, adrenal glands and bladder are otherwise unremarkable. There is no evidence of hydronephrosis or urinary calculi.  Stomach/Bowel: A small hiatal hernia is again noted. There is no evidence of bowel obstruction or focal bowel wall thickening. Descending and sigmoid colonic diverticulosis noted without evidence of diverticulitis.  Vascular/Lymphatic: Aortic atherosclerotic calcifications noted without aneurysm. No enlarged lymph nodes are identified.  Reproductive: The patient is status post hysterectomy. The adnexal regions are unremarkable.  Other: No free fluid or  pneumoperitoneum. Periumbilical surgical changes are identified. There is no evidence of ventral hernia.  Musculoskeletal: No acute abnormalities identified. Multilevel mild degenerative disc disease and moderate facet arthropathy in the lumbar spine noted.  IMPRESSION: No evidence of acute abnormality. No evidence of urinary calculi or hydronephrosis.  Small hiatal hernia and mild bilateral renal atrophy again noted.  Multilevel degenerative changes within the lumbar spine.  Coronary artery disease and aortic atherosclerosis.   Electronically Signed   By: Margarette Canada M.D.   On: 06/04/2015 12:09   Dg Chest Port 1 View  06/04/2015   CLINICAL DATA:  Sepsis.  Low-grade fever.  EXAM: PORTABLE CHEST 1 VIEW  COMPARISON:  04/27/2015  FINDINGS: Right PICC line tip:  SVC.  Atherosclerotic aortic arch. Thoracic spondylosis. Upper normal heart size.  The lungs appear clear.  IMPRESSION: 1. Atherosclerosis. 2. Right PICC line tip:  SVC.   Electronically Signed   By: Van Clines M.D.   On: 06/04/2015 10:55   I have personally reviewed and evaluated these images and lab results as part of my medical decision-making.   EKG Interpretation None      MDM   Final diagnoses:  Non-intractable vomiting with nausea, vomiting of unspecified type  Altered mental status, unspecified altered mental status type  Periumbilical abdominal pain  Dehydration   Patient is a 74 year old female with a history of hypertension, hernia repair resulting in fistula formation status post ostomy, coronary artery disease, congestive heart failure with an EF of 55-60% that presents with 5 days of emesis and 4 days of abdominal pain. Patient was  recently admitted to the hospital for 22 days after an abdominal surgery for a fistula repair. Patient has high output from her ostomy and his result is taking TPN. Patient saw surgery on Monday was given Zofran and however has had no improvement in her vomiting. This morning the patient had  altered mental status thinking that her daughter was from deceased mother. On arrival to the ED the patient as tachycardic, hypotensive, appears to not be feeling very well. Patient is oriented 4. On exam patient has diffuse tenderness to her abdomen however no signs of peritonitis at this time. Lung sounds are clear. We will do a septic workup however the patient is not febrile at this time so we will only give 1 L of IV fluid as the patient does have a history of heart failure and reassess.    Patient's labs remarkable for elevation in creatinine and BUN. Patient's BUN is 133 that can potentially be causing the patient's mental status change. Patient given IV fluid bolus and will start maintenance IV fluids. Patient will be admitted to medicine for further management. Patient's CT did not show any acute abnormality. At this time we will hold off on antibiotics is no clear source to treat.   Renne Musca, MD 06/04/15 [place], MD 06/04/15 [phone]

## 2015-06-04 NOTE — ED Notes (Signed)
Spoke with Ostomy Nurse. She will come see patient.

## 2015-06-04 NOTE — Consult Note (Signed)
WOC consulted while patient in the ED.  Reviewed record, it does appear that daughter is having a more difficult time managing her mother at home and is seeking LTAC placement.  When I met with patient and her daughter, daughter reported that [place] had been working at home, but she ran out and did not realize she had to order more.  HHRN will not provide and had replaced with urinary ostomy pouch.   Dr. Johnny Bridge had removed a stitch from the wound on Monday and the daughter reported after the removal she began to drain much, much more. Pt came to ED with AMS and abdominal pain. Today is in discomfort when I remove the ostomy pouch.  Her periwound skin is very denuded including her pannus and into her groin, it appears she has had leakage for several days and of course the effluent will breakdown the skin quickly due to the acidity of the output.    I cleansed all the skin and cut a small Eakin pouch to fit the perimeter of the wound, she has one fistula that is actively draining green effluent at 12 o'clock.  I attempted to build up the midline wound just distal to the edge of the perimeter of the Eakin pouch with several strips of eakin barrier to create a dam and flatten this area, however the skin is so denuded I am concerned it will not stay/stick to the wet, weeping skin. Therefore I decided to place the pouch to [facility] to allow for the pouch to stay continuously drained and allow the skin some time to heal.  We do this routinely when the skin has this much damage or the output can not be managed effectively with the pouch alone. I have explained the rationale to the bedside nurse.  They will maintain the LWS for now and if the patient is admitted.  If the patient is DC to home will need to just DC the suction and cap off the Eakin for manual drainage.  I have explained all the as well to the daughter and she is very familiar with this process.  She is concerned about managing her mother at home and  she also noted that she will need to order the appropriate supplies.  I have provided her with the number for [facility] (at [place]) as well as the items numbers for the Eakin pouch and the barrier rings needed.  Denuded skin below the pouch was then addressed.  I cleansed this area and crusted the worst area along the midline of the abdomen with ostomy powder. It is actively oozing blood as I attempt to treat the area.  I have covered it with dry gauze and secured with minimal tape.  The additional denuded skin on the pannus, groin, and mons can be treated with zinc based barrier cream ([employer] Clear) and I have instructed the bedside nurse to obtain and apply PRN.  If Marissa [name] is admitted will have [place] team follow up on her tomorrow.  One additional Eakin pouch and barrier rings in the patient's room, instructed daughter to make sure this goes with her to the floor or with them to home if she is not admitted.   Melody [place] RN,CWOCN [phone]

## 2015-06-04 NOTE — ED Notes (Signed)
Patient returned from CT

## 2015-06-05 DIAGNOSIS — Z789 Other specified health status: Secondary | ICD-10-CM

## 2015-06-05 DIAGNOSIS — K632 Fistula of intestine: Secondary | ICD-10-CM

## 2015-06-05 LAB — COMPREHENSIVE METABOLIC PANEL
ALBUMIN: 2.5 g/dL — AB (ref 3.5–5.0)
ALT: 84 U/L — AB (ref 14–54)
AST: 35 U/L (ref 15–41)
Alkaline Phosphatase: 107 U/L (ref 38–126)
Anion gap: 4 — ABNORMAL LOW (ref 5–15)
BILIRUBIN TOTAL: 0.5 mg/dL (ref 0.3–1.2)
BUN: 105 mg/dL — AB (ref 6–20)
CO2: 9 mmol/L — ABNORMAL LOW (ref 22–32)
CREATININE: 2.03 mg/dL — AB (ref 0.44–1.00)
Calcium: 9.6 mg/dL (ref 8.9–10.3)
Chloride: 128 mmol/L — ABNORMAL HIGH (ref 101–111)
GFR calc Af Amer: 27 mL/min — ABNORMAL LOW (ref >60)
GFR calc non Af Amer: 23 mL/min — ABNORMAL LOW (ref >60)
GLUCOSE: 114 mg/dL — AB (ref 65–99)
POTASSIUM: 5 mmol/L (ref 3.5–5.1)
Sodium: 141 mmol/L (ref 135–145)
TOTAL PROTEIN: 6.4 g/dL — AB (ref 6.5–8.1)

## 2015-06-05 LAB — CBC
HEMATOCRIT: 24.2 % — AB (ref 36.0–46.0)
HEMOGLOBIN: 7.8 g/dL — AB (ref 12.0–15.0)
MCH: 22.7 pg — ABNORMAL LOW (ref 26.0–34.0)
MCHC: 32.2 g/dL (ref 30.0–36.0)
MCV: 70.3 fL — ABNORMAL LOW (ref 78.0–100.0)
Platelets: 239 10*3/uL (ref 150–400)
RBC: 3.44 MIL/uL — ABNORMAL LOW (ref 3.87–5.11)
RDW: 16.9 % — ABNORMAL HIGH (ref 11.5–15.5)
WBC: 8.1 10*3/uL (ref 4.0–10.5)

## 2015-06-05 LAB — MAGNESIUM: Magnesium: 2.7 mg/dL — ABNORMAL HIGH (ref 1.7–2.4)

## 2015-06-05 LAB — DIFFERENTIAL
BASOS ABS: 0 10*3/uL (ref 0.0–0.1)
BASOS PCT: 0 %
EOS PCT: 1 %
Eosinophils Absolute: 0.1 10*3/uL (ref 0.0–0.7)
LYMPHS PCT: 13 %
Lymphs Abs: 1.1 10*3/uL (ref 0.7–4.0)
Monocytes Absolute: 1 10*3/uL (ref 0.1–1.0)
Monocytes Relative: 12 %
Neutro Abs: 5.9 10*3/uL (ref 1.7–7.7)
Neutrophils Relative %: 74 %

## 2015-06-05 LAB — PHOSPHORUS: Phosphorus: 6.2 mg/dL — ABNORMAL HIGH (ref 2.5–4.6)

## 2015-06-05 LAB — URINE CULTURE: Culture: NO GROWTH

## 2015-06-05 LAB — PREALBUMIN: Prealbumin: 31.1 mg/dL (ref 18–38)

## 2015-06-05 LAB — TRIGLYCERIDES: TRIGLYCERIDES: 63 mg/dL (ref <150)

## 2015-06-05 LAB — GLUCOSE, CAPILLARY: GLUCOSE-CAPILLARY: 119 mg/dL — AB (ref 65–99)

## 2015-06-05 MED ORDER — DEXTROSE-NACL 5-0.9 % IV SOLN
INTRAVENOUS | Status: AC
  Administered 2015-06-06: 10:00:00 via INTRAVENOUS

## 2015-06-05 MED ORDER — DEXTROSE-NACL 5-0.9 % IV SOLN: INTRAVENOUS | Status: DC

## 2015-06-05 MED ORDER — INSULIN ASPART 100 UNIT/ML ~~LOC~~ SOLN
0.0000 [IU] | SUBCUTANEOUS | Status: DC
  Administered 2015-06-06: 1 [IU] via SUBCUTANEOUS

## 2015-06-05 MED ORDER — TRACE MINERALS CR-CU-MN-SE-ZN 10-1000-500-60 MCG/ML IV SOLN
INTRAVENOUS | Status: AC
  Administered 2015-06-05: 18:00:00 via INTRAVENOUS
  Filled 2015-06-05: qty 1750

## 2015-06-05 MED ORDER — FAT EMULSION 20 % IV EMUL
240.0000 mL | INTRAVENOUS | Status: AC
  Administered 2015-06-05: 240 mL via INTRAVENOUS
  Filled 2015-06-05 (×2): qty 250

## 2015-06-05 MED ORDER — MORPHINE SULFATE (PF) 2 MG/ML IV SOLN
2.0000 mg | INTRAVENOUS | Status: DC | PRN
Start: 2015-06-05 — End: 2015-06-07
  Administered 2015-06-06: 2 mg via INTRAVENOUS
  Administered 2015-06-06 – 2015-06-07 (×2): 4 mg via INTRAVENOUS
  Filled 2015-06-05 (×2): qty 1
  Filled 2015-06-05 (×2): qty 2

## 2015-06-05 NOTE — Consult Note (Addendum)
WOC wound follow up Pt familiar to [place] team from previous admission. Current fistula pouch is leaking onto denuded lower abd skin. Cleansed all the skin and cut a small Eakin pouch to fit the perimeter of the wound, she has one fistula that is actively draining green effluent at 12 o'clock.Wound is full thickness; 4.5X2.5X.3cm, 100% beefy red with mod amt green drainage, no odor. Lower abd with partial thickness skin loss, red and moist and bleeding small amt in patchy areas.  Affected area is 6X6cm.  Crusted with ostomy powder and skin prep to protect skin, very painful process for the patient. There are several creases which occur when patient leans forward; difficult pouching situation. Built up the midline wound just distal to the edge of the perimeter of the Eakin pouch with several strips of eakin barrier to create a dam and flatten this area, Skin remains moist and denuded which makes it difficult to maintain pouch seal. Placed pouch to LWS with suction catheter to allow for the pouch to stay continuously drained and allow the skin some time to heal while she is in the hospital. We do this routinely when the skin has this much damage or the output can not be managed effectively with the pouch alone. If the patient is d/c to home will need to just DC the suction and cap off the Eakin for manual drainage. Foam dressing applied to lower abd to absorb drainage and promote healing. Supplies at bedside and instructions provided for bedside nurses. Julien Girt MSN, RN, [place], [place], [place]

## 2015-06-05 NOTE — Progress Notes (Signed)
Patient ID: Marissa [name], female   DOB: [birth year], 74 y.o.   MRN: [id number]     [facility]      [address] [place], [place] [address]    Phone: [phone] FAX: [phone]     Subjective: Sore.  Has not been eating at home. No white count.  sCr improved.   Objective:  Vital signs:  Filed Vitals:   06/04/15 1815 06/04/15 1816 06/04/15 1936 06/05/15 0546  BP: 116/65 116/65 130/65 94/49  Pulse: 108 108 104 90  Temp:   98.9 F (37.2 C) 98 F (36.7 C)  TempSrc:  Rectal Rectal Oral  Resp: $Remo'22 25 18 20  'LPzHR$ Height:      Weight:      SpO2: 100% 100% 100% 96%       Intake/Output   Yesterday:  09/22 0701 - 09/23 0700 In: 1389.6 [P.O.:120; I.V.:1269.6] Out: 350 [Urine:350] This shift: I/O last 3 completed shifts: In: 1389.6 [P.O.:120; I.V.:1269.6] Out: 350 [Urine:350]    Physical Exam: General: Pt awake/alert/oriented x4 in mild acute distress Abdomen: Soft.  Nondistended.  Tender.  Midline wound is superficial and beefy red, small pinpoint with bilious output.  Surrounding skin is excoriated and tender. WOC at bedside.     Problem List:   Active Problems:   Dehydration   Encephalopathy   Abdominal pain   Acute renal failure    Results:   Labs: Results for orders placed or performed during the hospital encounter of 06/04/15 (from the past 48 hour(s))  Urinalysis, Routine w reflex microscopic (not at [facility])     Status: Abnormal   Collection Time: 06/04/15 10:39 AM  Result Value Ref Range   Color, Urine YELLOW YELLOW   APPearance CLEAR CLEAR   Specific Gravity, Urine 1.014 1.005 - 1.030   pH 5.5 5.0 - 8.0   Glucose, UA NEGATIVE NEGATIVE mg/dL   Hgb urine dipstick NEGATIVE NEGATIVE   Bilirubin Urine NEGATIVE NEGATIVE   Ketones, ur NEGATIVE NEGATIVE mg/dL   Protein, ur 30 (A) NEGATIVE mg/dL   Urobilinogen, UA 0.2 0.0 - 1.0 mg/dL   Nitrite NEGATIVE NEGATIVE   Leukocytes, UA NEGATIVE NEGATIVE  Urine  microscopic-add on     Status: Abnormal   Collection Time: 06/04/15 10:39 AM  Result Value Ref Range   Squamous Epithelial / LPF FEW (A) RARE   WBC, UA 0-2 <3 WBC/hpf   Casts GRANULAR CAST (A) NEGATIVE  Comprehensive metabolic panel     Status: Abnormal   Collection Time: 06/04/15 10:50 AM  Result Value Ref Range   Sodium 138 135 - 145 mmol/L   Potassium 5.8 (H) 3.5 - 5.1 mmol/L   Chloride 120 (H) 101 - 111 mmol/L   CO2 6 (L) 22 - 32 mmol/L   Glucose, Bld 111 (H) 65 - 99 mg/dL   BUN 130 (H) 6 - 20 mg/dL   Creatinine, Ser 2.13 (H) 0.44 - 1.00 mg/dL   Calcium 10.4 (H) 8.9 - 10.3 mg/dL   Total Protein 7.5 6.5 - 8.1 g/dL   Albumin 2.9 (L) 3.5 - 5.0 g/dL   AST 54 (H) 15 - 41 U/L   ALT 108 (H) 14 - 54 U/L   Alkaline Phosphatase 114 38 - 126 U/L   Total Bilirubin 0.2 (L) 0.3 - 1.2 mg/dL   GFR calc non Af Amer 22 (L) >60 mL/min   GFR calc Af Amer 25 (L) >60 mL/min    Comment: (NOTE) The eGFR has  been calculated using the CKD EPI equation. This calculation has not been validated in all clinical situations. eGFR's persistently <60 mL/min signify possible Chronic Kidney Disease.    Anion gap 12 5 - 15  CBC WITH DIFFERENTIAL     Status: Abnormal   Collection Time: 06/04/15 10:50 AM  Result Value Ref Range   WBC 11.9 (H) 4.0 - 10.5 K/uL   RBC 3.88 3.87 - 5.11 MIL/uL   Hemoglobin 8.9 (L) 12.0 - 15.0 g/dL   HCT 27.6 (L) 36.0 - 46.0 %   MCV 71.1 (L) 78.0 - 100.0 fL   MCH 22.9 (L) 26.0 - 34.0 pg   MCHC 32.2 30.0 - 36.0 g/dL   RDW 16.6 (H) 11.5 - 15.5 %   Platelets 271 150 - 400 K/uL   Neutrophils Relative % 80 %   Neutro Abs 9.2 (H) 1.7 - 7.7 K/uL   Lymphocytes Relative 8 %   Lymphs Abs 0.9 0.7 - 4.0 K/uL   Monocytes Relative 12 %   Monocytes Absolute 1.3 (H) 0.1 - 1.0 K/uL   Eosinophils Relative 0 %   Eosinophils Absolute 0.0 0.0 - 0.7 K/uL   Basophils Relative 0 %   Basophils Absolute 0.0 0.0 - 0.1 K/uL  I-Stat CG4 Lactic Acid, ED  (not at  [facility])     Status: Abnormal    Collection Time: 06/04/15 11:05 AM  Result Value Ref Range   Lactic Acid, Venous 0.35 (L) 0.5 - 2.0 mmol/L  I-stat Chem 8, ED     Status: Abnormal   Collection Time: 06/04/15 11:13 AM  Result Value Ref Range   Sodium 141 135 - 145 mmol/L   Potassium 5.7 (H) 3.5 - 5.1 mmol/L   Chloride 123 (H) 101 - 111 mmol/L   BUN 133 (H) 6 - 20 mg/dL   Creatinine, Ser 2.20 (H) 0.44 - 1.00 mg/dL   Glucose, Bld 112 (H) 65 - 99 mg/dL   Calcium, Ion 1.59 (H) 1.13 - 1.30 mmol/L   TCO2 7 0 - 100 mmol/L   Hemoglobin 9.5 (L) 12.0 - 15.0 g/dL   HCT 28.0 (L) 36.0 - 46.0 %  I-Stat CG4 Lactic Acid, ED  (not at  [facility])     Status: None   Collection Time: 06/04/15  2:08 PM  Result Value Ref Range   Lactic Acid, Venous 0.96 0.5 - 2.0 mmol/L  Comprehensive metabolic panel     Status: Abnormal   Collection Time: 06/05/15  5:00 AM  Result Value Ref Range   Sodium 141 135 - 145 mmol/L   Potassium 5.0 3.5 - 5.1 mmol/L   Chloride 128 (H) 101 - 111 mmol/L   CO2 9 (L) 22 - 32 mmol/L   Glucose, Bld 114 (H) 65 - 99 mg/dL   BUN 105 (H) 6 - 20 mg/dL   Creatinine, Ser 2.03 (H) 0.44 - 1.00 mg/dL   Calcium 9.6 8.9 - 10.3 mg/dL   Total Protein 6.4 (L) 6.5 - 8.1 g/dL   Albumin 2.5 (L) 3.5 - 5.0 g/dL   AST 35 15 - 41 U/L   ALT 84 (H) 14 - 54 U/L   Alkaline Phosphatase 107 38 - 126 U/L   Total Bilirubin 0.5 0.3 - 1.2 mg/dL   GFR calc non Af Amer 23 (L) >60 mL/min   GFR calc Af Amer 27 (L) >60 mL/min    Comment: (NOTE) The eGFR has been calculated using the CKD EPI equation. This calculation has not been validated in all  clinical situations. eGFR's persistently <60 mL/min signify possible Chronic Kidney Disease.    Anion gap 4 (L) 5 - 15  Prealbumin     Status: None   Collection Time: 06/05/15  5:00 AM  Result Value Ref Range   Prealbumin 31.1 18 - 38 mg/dL  Magnesium     Status: Abnormal   Collection Time: 06/05/15  5:00 AM  Result Value Ref Range   Magnesium 2.7 (H) 1.7 - 2.4 mg/dL  Phosphorus     Status:  Abnormal   Collection Time: 06/05/15  5:00 AM  Result Value Ref Range   Phosphorus 6.2 (H) 2.5 - 4.6 mg/dL  Triglycerides     Status: None   Collection Time: 06/05/15  5:00 AM  Result Value Ref Range   Triglycerides 63 <150 mg/dL  CBC     Status: Abnormal   Collection Time: 06/05/15  5:00 AM  Result Value Ref Range   WBC 8.1 4.0 - 10.5 K/uL    Comment: WHITE COUNT CONFIRMED ON SMEAR   RBC 3.44 (L) 3.87 - 5.11 MIL/uL   Hemoglobin 7.8 (L) 12.0 - 15.0 g/dL   HCT 24.2 (L) 36.0 - 46.0 %   MCV 70.3 (L) 78.0 - 100.0 fL   MCH 22.7 (L) 26.0 - 34.0 pg   MCHC 32.2 30.0 - 36.0 g/dL   RDW 16.9 (H) 11.5 - 15.5 %   Platelets 239 150 - 400 K/uL    Comment: PLATELET COUNT CONFIRMED BY SMEAR  Differential     Status: None   Collection Time: 06/05/15  5:00 AM  Result Value Ref Range   Neutrophils Relative % 74 %   Lymphocytes Relative 13 %   Monocytes Relative 12 %   Eosinophils Relative 1 %   Basophils Relative 0 %   Neutro Abs 5.9 1.7 - 7.7 K/uL   Lymphs Abs 1.1 0.7 - 4.0 K/uL   Monocytes Absolute 1.0 0.1 - 1.0 K/uL   Eosinophils Absolute 0.1 0.0 - 0.7 K/uL   Basophils Absolute 0.0 0.0 - 0.1 K/uL    Imaging / Studies: Ct Abdomen Pelvis Wo Contrast  06/04/2015   CLINICAL DATA:  74 year old female with acute right abdominal and flank pain, nausea and vomiting for 1 week.  EXAM: CT ABDOMEN AND PELVIS WITHOUT CONTRAST  TECHNIQUE: Multidetector CT imaging of the abdomen and pelvis was performed following the standard protocol without IV contrast.  COMPARISON:  05/06/2015 and prior CTs  FINDINGS: Please note that parenchymal abnormalities may be missed without intravenous contrast.  Lower chest:  Coronary artery calcifications again identified.  Hepatobiliary: The liver is unremarkable. Patient is status post cholecystectomy. There is no evidence biliary dilatation.  Pancreas: Unremarkable  Spleen: Unremarkable  Adrenals/Urinary Tract: Mild bilateral renal atrophy again identified. The kidneys,  adrenal glands and bladder are otherwise unremarkable. There is no evidence of hydronephrosis or urinary calculi.  Stomach/Bowel: A small hiatal hernia is again noted. There is no evidence of bowel obstruction or focal bowel wall thickening. Descending and sigmoid colonic diverticulosis noted without evidence of diverticulitis.  Vascular/Lymphatic: Aortic atherosclerotic calcifications noted without aneurysm. No enlarged lymph nodes are identified.  Reproductive: The patient is status post hysterectomy. The adnexal regions are unremarkable.  Other: No free fluid or pneumoperitoneum. Periumbilical surgical changes are identified. There is no evidence of ventral hernia.  Musculoskeletal: No acute abnormalities identified. Multilevel mild degenerative disc disease and moderate facet arthropathy in the lumbar spine noted.  IMPRESSION: No evidence of acute abnormality. No evidence of urinary  calculi or hydronephrosis.  Small hiatal hernia and mild bilateral renal atrophy again noted.  Multilevel degenerative changes within the lumbar spine.  Coronary artery disease and aortic atherosclerosis.   Electronically Signed   By: Margarette Canada M.D.   On: 06/04/2015 12:09   Dg Chest Port 1 View  06/04/2015   CLINICAL DATA:  Sepsis.  Low-grade fever.  EXAM: PORTABLE CHEST 1 VIEW  COMPARISON:  04/27/2015  FINDINGS: Right PICC line tip:  SVC.  Atherosclerotic aortic arch. Thoracic spondylosis. Upper normal heart size.  The lungs appear clear.  IMPRESSION: 1. Atherosclerosis. 2. Right PICC line tip:  SVC.   Electronically Signed   By: Van Clines M.D.   On: 06/04/2015 10:55    Medications / Allergies:  Scheduled Meds: . enoxaparin (LOVENOX) injection  30 mg Subcutaneous Q24H  . metoprolol tartrate  25 mg Oral q morning - 10a  . pantoprazole  40 mg Oral Daily   Continuous Infusions: . dextrose 5 % and 0.9% NaCl 125 mL/hr at 06/05/15 0327   PRN Meds:.acetaminophen **OR** acetaminophen, alum & mag hydroxide-simeth,  methocarbamol, morphine injection, nitroGLYCERIN, ondansetron **OR** ondansetron (ZOFRAN) IV, oxyCODONE, sodium chloride  Antibiotics: Anti-infectives    None        Assessment/Plan EC fistula  -will make her NPO, resume TPN -accurate intake and output -pain control  S/p abdominal wound exploration and explantation of infected mesh 04/28/15-wound looks good.  Surrounding skin is broken down from the bilious drainage.  Appreciate WOC management.  Has an eakin's pouch to suction.  No output recorded.  About 288ml in the cannister.  AKI-likely dehydration, medicine managing  Dispo-she will need SNF placement as she and her daughter cannot manage the wound/fistula at home.   Erby Pian, [facility] Surgery Pager [phone]) For consults and floor pages call [phone]([id number]-4:30P)  06/05/2015 9:56 AM

## 2015-06-05 NOTE — Progress Notes (Signed)
Utilization review completed. Bertha Stanfill, RN, BSN. 

## 2015-06-05 NOTE — Progress Notes (Signed)
[facility]  Patient Status: Active (receiving services up to time of hospitalization)  [facility] is providing the following services: RN, MSW and Home Infusion Services (teaching and education will be done by nurse in the home with patient and caregiver)  Needs SNF at discharge as daughter is having difficulty providing care needed for this patient.  If patient discharges after hours, please call [phone].   Janae Sauce 06/05/2015, 9:25 AM

## 2015-06-05 NOTE — Progress Notes (Signed)
Initial Nutrition Assessment  DOCUMENTATION CODES:   Obesity unspecified  INTERVENTION:    Continue cyclic TPN per Pharmacy to meet 100% of calorie needs and 88% of minimum protein needs.   Diet advancement as able per physician.   NUTRITION DIAGNOSIS:   Inadequate oral intake related to altered GI function, inability to eat as evidenced by NPO status.  GOAL:   Patient will meet greater than or equal to 90% of their needs  MONITOR:   Labs, Weight trends, Skin, Diet advancement  REASON FOR ASSESSMENT:     New TPN/TNA  ASSESSMENT:   74 year old female with hypertension, hyperlipidemia, GERD, coronary artery disease, stage III chronic kidney disease, and heart failure. The patient also has a complex surgical history. She is status post left hemicolectomy with left transverse colon to sigmoid colon anastomosis for diverticulitis, umbilical hernia repair, repair of incarcerated ventral incisional hernia in 2009, open abdominal hysterectomy in 2013, abdominal wound exploration of enterocutaneous fistula and abscess in 04/28/2015 by Dr.Tsuei. Patient has had an ostomy that as high output. Patient was brought to the emergency department due to increased confusion:  Labs reviewed: phosphorus, magnesium, BUN, & creatinine are elevated. Nutrition focused physical exam completed.  No muscle or subcutaneous fat depletion noticed. Patient was resting during RD visit, she did not wake up during physical exam, unable to provide any nutrition hx.  Patient with a lot of skin breakdown around her abdominal incision site. [place] RN put an Eakin pouch over the abd wound and it is to suction to allow the skin to heal. There is also a fistula that is draining green output. Plans to continue bowel rest for a few days and assess fistula drainage amount. May be able to resume clear liquids in a few days. Surgical Team is following. Patient receives cyclic compounded 3:1 TPN over 12 hours at home which  provides ~1900 kcal and 90g protein which also contains electrolytes.  While in the hospital, to receive cyclic TPN with Clinimix E 5/15 1750 ml and Intralipid 20% 240 ml, both to run over 12 hours. Will provide 1990 ml, 1723 kcal, and 88 grams protein per day. Will meet 100% minimum estimated energy needs and 88% minimum estimated protein needs.  Diet Order:  Diet NPO time specified TPN (CLINIMIX) Adult without lytes  Skin:  Wound (see comment) (abdominal incision/fistula)  Last BM:  unknown  Height:   Ht Readings from Last 1 Encounters:  06/04/15 5\' 4"  (1.626 m)    Weight:   Wt Readings from Last 1 Encounters:  06/04/15 225 lb (102.059 kg)    Ideal Body Weight:  54.5 kg  BMI:  Body mass index is 38.6 kg/(m^2).  Estimated Nutritional Needs:   Kcal:  1700-1900  Protein:  100-110 gm  Fluid:  1.8-2 L  EDUCATION NEEDS:   No education needs identified at this time  Molli Barrows, [place], [place], [place] Pager [phone] After Hours Pager [phone]

## 2015-06-05 NOTE — Progress Notes (Signed)
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  No Known Allergies  Patient Measurements: Height: 5\' 4"  (162.6 cm) Weight: 225 lb (102.059 kg) IBW/kg (Calculated) : 54.7 Adjusted Body Weight:  Usual Weight:   Vital Signs: Temp: 98 F (36.7 C) (09/23 0546) Temp Source: Oral (09/23 0546) BP: 94/49 mmHg (09/23 0546) Pulse Rate: 90 (09/23 0546) Intake/Output from previous day: 09/22 0701 - 09/23 0700 In: 1389.6 [P.O.:120; I.V.:1269.6] Out: 350 [Urine:350] Intake/Output from this shift:    Labs:  Recent Labs  06/04/15 1050 06/04/15 1113 06/05/15 0500  WBC 11.9*  --  8.1  HGB 8.9* 9.5* 7.8*  HCT 27.6* 28.0* 24.2*  PLT 271  --  239     Recent Labs  06/04/15 1050 06/04/15 1113 06/05/15 0500  NA 138 141 141  K 5.8* 5.7* 5.0  CL 120* 123* 128*  CO2 6*  --  9*  GLUCOSE 111* 112* 114*  BUN 130* 133* 105*  CREATININE 2.13* 2.20* 2.03*  CALCIUM 10.4*  --  9.6  MG  --   --  2.7*  PHOS  --   --  6.2*  PROT 7.5  --  6.4*  ALBUMIN 2.9*  --  2.5*  AST 54*  --  35  ALT 108*  --  84*  ALKPHOS 114  --  107  BILITOT 0.2*  --  0.5  PREALBUMIN  --   --  31.1  TRIG  --   --  63   Estimated Creatinine Clearance: 28.3 mL/min (by C-G formula based on Cr of 2.03).   No results for input(s): GLUCAP in the last 72 hours.  Medications:  Scheduled:  . enoxaparin (LOVENOX) injection  30 mg Subcutaneous Q24H  . metoprolol tartrate  25 mg Oral q morning - 10a  . pantoprazole  40 mg Oral Daily    Insulin Requirements in the past 24 hours:  No SSI order No insulin in home TPN  Assessment: 45 YOF with an extensive surgical history to continue on TPN for EC fistula.  CT shows no acute abnormality.  I have contacted [place] this AM.  Patient is on cyclic compounded 3:1 TPN over 12 hours at home which provides ~1900 kcal and 90g protein which also contains electrolytes.  We will be able to closely  approximate these needs  Admit: AMS, abd pain  Surgeries/Procedures: hx left hemicolectomy with left transverse colon to sigmoid colon anastomosis for diverticulitis, umbilical hernia repair, repair of incarcerated ventral incisional hernia in 2009, open abd hysterectomy in 2013, abdominal wound exploration of EC fistula and abscess in 04/28/2015  GI: GERD / esophagitis / diverticulitis / occasional bowel incontinence / high output EC fistula.  Fistula with bilious output.  Abd soft, non-tender.  Endo: gout.  DM2 - serum gluc 114.  No insulin in home TPN. Lytes:  K 5, Mg 2.7, Phos 6.2.  Elevated electrolytes on admission with lytes in home TPN, improved this AM but remain elevated. Renal: CKD3 - SCr 2.03 (baseline 1.23, early Sept was 1.45).  Lytes elevated with increased Cr.  Receiving D5NS at 144ml/hr Pulm: OSA on CPAP.  Currently RA Cards: HTN / HLD / CAD / CHF (EF 55-60%).  BP soft, hr wnl Hepatobil: AST/ALT elevated, tbili WNL.  Prealbumin 31.1- home TPN is meeting nutritional needs Neuro: AMS improving ID: [id number] wnl, AFeb - not on abx  Best Practices: Lovenox TPN Access: PICC line placed 04/27/15  TPN start date: 9/23 >>  Current Nutrition:   Full liquid diet Cyclic TPN at home (last 9/21)  Plan:   -Clinimix 5/15 (no lytes) 178mL and Intralipid 20% 277mL, both to run over 12 hours and providing 1723 kcal and 88g protein -BMet, Mg, Phos in AM -Sens SSI, with plans to d/c if cbg remains within goal as pt on no insulin pta -Add back lytes as able, may require alternating regimen of Clinimix while here -Change IVF to 66ml/hr when off TPN & KVO while on TPN, to maintain current total fluid of 3021mL per day.  Gracy Bruins, PharmD Clinical Pharmacist [facility]

## 2015-06-05 NOTE — Progress Notes (Signed)
PROGRESS NOTE  Marissa [name] SLH:[id number] DOB: April 12, 1941 DOA: 06/04/2015 PCP: [name], MD  HPI/Recap of past 24 hours:  Very weak and frail female, denies pain, reported feeling weak  Assessment/Plan: Active Problems:   Dehydration   Encephalopathy   Abdominal pain   Acute renal failure  Confusion/encephalopathy: improving with hydration. Continue monitor.  ARF on CKDII, from dehydration, ua no infection  H/o HTN: on lopressor QD? Will investigate, patient bp low normal likely will not need betablocker or low dose at most.  H/o chronic anemia, will close monitor, no need of transfusion yet. (h/o transfusion reaction per chart)  H/o NIDDM2: last A1c 5.7 in 03/2015, on ssi here  H/o gout: stable, colchicine held due to ARF, consider uloric   Chronic enterocutaneuos fistula: s/p Eakin pouch, difficult seal , currently on LWS, npo and TPN. management per woc and general surgery.  FTT: will likely need snf.  Code Status: full  Family Communication: patient   Disposition Plan: likely SNF   Consultants:  General surgery  WOC  Procedures:  TPN  Antibiotics:  none   Objective: BP 118/50 mmHg  Pulse 89  Temp(Src) 97.7 F (36.5 C) (Oral)  Resp 18  Ht 5\' 4"  (1.626 m)  Wt 225 lb (102.059 kg)  BMI 38.60 kg/m2  SpO2 100%  Intake/Output Summary (Last 24 hours) at 06/05/15 1825 Last data filed at 06/05/15 0600  Gross per 24 hour  Intake 1389.58 ml  Output    350 ml  Net 1039.58 ml   Filed Weights   06/04/15 1003  Weight: 225 lb (102.059 kg)    Exam:   General:  NAD  Cardiovascular: RRR  Respiratory: CTABL  Abdomen: Eakin pouch coneected to LWS, positive BS  Musculoskeletal: No Edema  Neuro: lethargic, follow command, oriented to person and time, not to place.  Data Reviewed: Basic Metabolic Panel:  Recent Labs Lab 06/04/15 1050 06/04/15 1113 06/05/15 0500  NA 138 141 141  K 5.8* 5.7* 5.0  CL 120* 123* 128*  CO2 6*  --  9*    GLUCOSE 111* 112* 114*  BUN 130* 133* 105*  CREATININE 2.13* 2.20* 2.03*  CALCIUM 10.4*  --  9.6  MG  --   --  2.7*  PHOS  --   --  6.2*   Liver Function Tests:  Recent Labs Lab 06/04/15 1050 06/05/15 0500  AST 54* 35  ALT 108* 84*  ALKPHOS 114 107  BILITOT 0.2* 0.5  PROT 7.5 6.4*  ALBUMIN 2.9* 2.5*   No results for input(s): LIPASE, AMYLASE in the last 168 hours. No results for input(s): AMMONIA in the last 168 hours. CBC:  Recent Labs Lab 06/04/15 1050 06/04/15 1113 06/05/15 0500  WBC 11.9*  --  8.1  NEUTROABS 9.2*  --  5.9  HGB 8.9* 9.5* 7.8*  HCT 27.6* 28.0* 24.2*  MCV 71.1*  --  70.3*  PLT 271  --  239   Cardiac Enzymes:   No results for input(s): CKTOTAL, CKMB, CKMBINDEX, TROPONINI in the last 168 hours. BNP (last 3 results)  Recent Labs  11/02/14 2340 03/19/15 1325  BNP 109.2* 141.6*    ProBNP (last 3 results) No results for input(s): PROBNP in the last 8760 hours.  CBG: No results for input(s): GLUCAP in the last 168 hours.  Recent Results (from the past 240 hour(s))  Urine culture     Status: None   Collection Time: 06/04/15 10:39 AM  Result Value Ref Range Status  Specimen Description URINE, CATHETERIZED  Final   Special Requests NONE  Final   Culture NO GROWTH 1 DAY  Final   Report Status 06/05/2015 FINAL  Final  Blood Culture (routine x 2)     Status: None (Preliminary result)   Collection Time: 06/04/15 10:50 AM  Result Value Ref Range Status   Specimen Description BLOOD PICC LINE  Final   Special Requests BOTTLES DRAWN AEROBIC AND ANAEROBIC 5CC  Final   Culture NO GROWTH 1 DAY  Final   Report Status PENDING  Incomplete  Blood Culture (routine x 2)     Status: None (Preliminary result)   Collection Time: 06/04/15 11:00 AM  Result Value Ref Range Status   Specimen Description BLOOD RIGHT HAND  Final   Special Requests BOTTLES DRAWN AEROBIC AND ANAEROBIC 5CC  Final   Culture NO GROWTH 1 DAY  Final   Report Status PENDING   Incomplete     Studies: No results found.  Scheduled Meds: . [START ON 06/06/2015] dextrose 5 % and 0.9% NaCl   Intravenous Q24H  . dextrose 5 % and 0.9% NaCl   Intravenous Q24H  . enoxaparin (LOVENOX) injection  30 mg Subcutaneous Q24H  . insulin aspart  0-9 Units Subcutaneous 3 times per day  . metoprolol tartrate  25 mg Oral q morning - 10a  . pantoprazole  40 mg Oral Daily    Continuous Infusions: . TPN (CLINIMIX) Adult without lytes     And  . fat emulsion       Time spent: 24mins  Xu,Fang MD, PhD  [facility] Pager [phone]. If 7PM-7AM, please contact night-coverage at [email or web address], password [facility] 06/05/2015, 6:25 PM  LOS: 1 day

## 2015-06-06 ENCOUNTER — Encounter (HOSPITAL_COMMUNITY): Payer: Self-pay | Admitting: Radiology

## 2015-06-06 ENCOUNTER — Inpatient Hospital Stay (HOSPITAL_COMMUNITY): Payer: Medicare Other

## 2015-06-06 LAB — GLUCOSE, CAPILLARY
Glucose-Capillary: 80 mg/dL (ref 65–99)
Glucose-Capillary: 116 mg/dL — ABNORMAL HIGH (ref 65–99)
Glucose-Capillary: 93 mg/dL (ref 65–99)
Glucose-Capillary: 138 mg/dL — ABNORMAL HIGH (ref 65–99)

## 2015-06-06 LAB — BASIC METABOLIC PANEL
ANION GAP: 6 (ref 5–15)
BUN: 98 mg/dL — ABNORMAL HIGH (ref 6–20)
CALCIUM: 9.5 mg/dL (ref 8.9–10.3)
CO2: 9 mmol/L — ABNORMAL LOW (ref 22–32)
CREATININE: 1.97 mg/dL — AB (ref 0.44–1.00)
Chloride: 129 mmol/L — ABNORMAL HIGH (ref 101–111)
GFR, EST AFRICAN AMERICAN: 28 mL/min — AB (ref >60)
GFR, EST NON AFRICAN AMERICAN: 24 mL/min — AB (ref >60)
Glucose, Bld: 111 mg/dL — ABNORMAL HIGH (ref 65–99)
Potassium: 4.7 mmol/L (ref 3.5–5.1)
SODIUM: 144 mmol/L (ref 135–145)

## 2015-06-06 LAB — PHOSPHORUS: PHOSPHORUS: 3.6 mg/dL (ref 2.5–4.6)

## 2015-06-06 LAB — MAGNESIUM: MAGNESIUM: 2 mg/dL (ref 1.7–2.4)

## 2015-06-06 MED ORDER — DEXTROSE-NACL 5-0.9 % IV SOLN
INTRAVENOUS | Status: DC
  Administered 2015-06-07: 09:00:00 via INTRAVENOUS

## 2015-06-06 MED ORDER — METOPROLOL TARTRATE 1 MG/ML IV SOLN
2.5000 mg | Freq: Four times a day (QID) | INTRAVENOUS | Status: DC | PRN
  Filled 2015-06-06: qty 5

## 2015-06-06 MED ORDER — NYSTATIN 100000 UNIT/ML MT SUSP
5.0000 mL | Freq: Four times a day (QID) | OROMUCOSAL | Status: DC
  Administered 2015-06-06 – 2015-06-16 (×33): 500000 [IU] via ORAL
  Filled 2015-06-06 (×35): qty 5

## 2015-06-06 MED ORDER — FAT EMULSION 20 % IV EMUL
240.0000 mL | INTRAVENOUS | Status: AC
  Administered 2015-06-06: 240 mL via INTRAVENOUS
  Filled 2015-06-06 (×2): qty 250

## 2015-06-06 MED ORDER — TRACE MINERALS CR-CU-MN-SE-ZN 10-1000-500-60 MCG/ML IV SOLN
INTRAVENOUS | Status: AC
  Administered 2015-06-06: 19:00:00 via INTRAVENOUS
  Filled 2015-06-06: qty 1800

## 2015-06-06 NOTE — Progress Notes (Signed)
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  No Known Allergies  Patient Measurements: Height: 5\' 4"  (162.6 cm) Weight: 225 lb (102.059 kg) IBW/kg (Calculated) : 54.7 Adjusted Body Weight:  Usual Weight:   Vital Signs: Temp: 97.8 F (36.6 C) (09/24 0758) Temp Source: Oral (09/24 0758) BP: 99/50 mmHg (09/24 0758) Pulse Rate: 104 (09/24 0758) Intake/Output from previous day: 09/23 0701 - 09/24 0700 In: 1863.7 [I.V.:10; TPN:1853.7] Out: 225 [Urine:225] Intake/Output from this shift:    Labs:  Recent Labs  06/04/15 1050 06/04/15 1113 06/05/15 0500  WBC 11.9*  --  8.1  HGB 8.9* 9.5* 7.8*  HCT 27.6* 28.0* 24.2*  PLT 271  --  239     Recent Labs  06/04/15 1050 06/04/15 1113 06/05/15 0500 06/06/15 0522  NA 138 141 141 144  K 5.8* 5.7* 5.0 4.7  CL 120* 123* 128* 129*  CO2 6*  --  9* 9*  GLUCOSE 111* 112* 114* 111*  BUN 130* 133* 105* 98*  CREATININE 2.13* 2.20* 2.03* 1.97*  CALCIUM 10.4*  --  9.6 9.5  MG  --   --  2.7* 2.0  PHOS  --   --  6.2* 3.6  PROT 7.5  --  6.4*  --   ALBUMIN 2.9*  --  2.5*  --   AST 54*  --  35  --   ALT 108*  --  84*  --   ALKPHOS 114  --  107  --   BILITOT 0.2*  --  0.5  --   PREALBUMIN  --   --  31.1  --   TRIG  --   --  63  --    Estimated Creatinine Clearance: 29.1 mL/min (by C-G formula based on Cr of 1.97).    Recent Labs  06/05/15 2252 06/06/15 0751  GLUCAP 119* 80    Medications:  Scheduled:  . dextrose 5 % and 0.9% NaCl   Intravenous Q24H  . dextrose 5 % and 0.9% NaCl   Intravenous Q24H  . enoxaparin (LOVENOX) injection  30 mg Subcutaneous Q24H  . insulin aspart  0-9 Units Subcutaneous 3 times per day  . metoprolol tartrate  25 mg Oral q morning - 10a  . pantoprazole  40 mg Oral Daily    Insulin Requirements in the past 24 hours:  0 units Sens SSI, no insulin in TPN No insulin in home TPN  Assessment: 8 YOF with an extensive  surgical history to continue on TPN for EC fistula. CT shows no acute abnormality. I have contacted [place] this AM. Patient is on cyclic compounded 3:1 TPN over 12 hours at home which provides ~1900 kcal and 90g protein which also contains electrolytes.   Admit: AMS, abd pain  Surgeries/Procedures: hx left hemicolectomy with left transverse colon to sigmoid colon anastomosis for diverticulitis, umbilical hernia repair, repair of incarcerated ventral incisional hernia in 2009, open abd hysterectomy in 2013, abdominal wound exploration of EC fistula and abscess in 04/28/2015  GI: GERD / esophagitis / diverticulitis / occasional bowel incontinence / high output EC fistula. Fistula with bilious output. Abd soft, non-tender. Nausea improved.  Eakin's pouch to suction with 343mL out.  Considering Sandostatin Endo: gout. DM2- no insulin in home TPN.  CBG 119 on TPN, 80 off TPN. Lytes: K 4.7, Mg 2, Phos 3.6.   Renal: CKD3 - SCr 1.97 (baseline 1.23, early Sept was 1.45). Lytes correcting  with improved Cr. Receiving D5NS at 24ml/hr OFF TPN & KVO ON TPN Pulm: OSA on CPAP. Currently RA Cards: HTN / HLD / CAD / CHF (EF 55-60%). BP soft, hr wnl Hepatobil: AST/ALT elevated, tbili WNL. Prealbumin 31.1- home TPN is meeting nutritional needs Neuro: AMS improving.  Sleepy ID: WBC wnl, AFeb - not on abx  Best Practices: Lovenox TPN Access: PICC line placed 04/27/15 TPN start date: 9/23 >>  Current Nutrition:  Full liquid diet Cyclic TPN at home (last 9/21)  Plan:  -Clinimix 5/15 (no lytes) 1832mL and Intralipid 20% 252mL, both to run over 12 hours and providing ~1800 kcal and 90g protein & closely approximating home TPN -BMet, Mg, Phos in AM -Sens SSI, with plans to d/c if cbg remains within goal as pt on no insulin pta -Add back lytes as able, may require alternating regimen of Clinimix while here -Change IVF to 87ml/hr when off TPN & KVO while on TPN, to maintain current total  fluid of 3067mL per day. -D/C SSI  Gracy Bruins, PharmD Clinical Pharmacist [facility]

## 2015-06-06 NOTE — Progress Notes (Signed)
[facility] Progress Note     Subjective: Very sleepy this am.  Mild nausea but improved from admission now that NPO.  Lynann Beaver continues to leak, but WOC is working on it with her.  She's frustrated.    Objective: Vital signs in last 24 hours: Temp:  [97.5 F (36.4 C)-98.2 F (36.8 C)] 97.5 F (36.4 C) (09/24 0548) Pulse Rate:  [89-114] 114 (09/24 0548) Resp:  [18-22] 20 (09/24 0548) BP: (97-118)/(29-59) 108/29 mmHg (09/24 0548) SpO2:  [99 %-100 %] 99 % (09/24 0548) Last BM Date:  (PTA )  Intake/Output from previous day: 09/23 0701 - 09/24 0700 In: 1863.7 [I.V.:10; TPN:1853.7] Out: 225 [Urine:225] Intake/Output this shift:    PE: Gen:  Alert, NAD, pleasant Abd: Obese, soft, mild tenderness, ND, +BS, no HSM, wound with Eakins pouch, greenish yellow drainage in canister, still leaking out of Eakins   Lab Results:   Recent Labs  06/04/15 1050 06/04/15 1113 06/05/15 0500  WBC 11.9*  --  8.1  HGB 8.9* 9.5* 7.8*  HCT 27.6* 28.0* 24.2*  PLT 271  --  239   BMET  Recent Labs  06/05/15 0500 06/06/15 0522  NA 141 144  K 5.0 4.7  CL 128* 129*  CO2 9* 9*  GLUCOSE 114* 111*  BUN 105* 98*  CREATININE 2.03* 1.97*  CALCIUM 9.6 9.5   PT/INR No results for input(s): LABPROT, INR in the last 72 hours. CMP     Component Value Date/Time   NA 144 06/06/2015 0522   K 4.7 06/06/2015 0522   CL 129* 06/06/2015 0522   CO2 9* 06/06/2015 0522   GLUCOSE 111* 06/06/2015 0522   BUN 98* 06/06/2015 0522   CREATININE 1.97* 06/06/2015 0522   CREATININE 1.64* 06/27/2014 1647   CALCIUM 9.5 06/06/2015 0522   PROT 6.4* 06/05/2015 0500   ALBUMIN 2.5* 06/05/2015 0500   AST 35 06/05/2015 0500   ALT 84* 06/05/2015 0500   ALKPHOS 107 06/05/2015 0500   BILITOT 0.5 06/05/2015 0500   GFRNONAA 24* 06/06/2015 0522   GFRAA 28* 06/06/2015 0522   Lipase     Component Value Date/Time   LIPASE 27 12/26/2014 1815       Studies/Results: Ct Abdomen Pelvis Wo  Contrast  06/04/2015   CLINICAL DATA:  74 year old female with acute right abdominal and flank pain, nausea and vomiting for 1 week.  EXAM: CT ABDOMEN AND PELVIS WITHOUT CONTRAST  TECHNIQUE: Multidetector CT imaging of the abdomen and pelvis was performed following the standard protocol without IV contrast.  COMPARISON:  05/06/2015 and prior CTs  FINDINGS: Please note that parenchymal abnormalities may be missed without intravenous contrast.  Lower chest:  Coronary artery calcifications again identified.  Hepatobiliary: The liver is unremarkable. Patient is status post cholecystectomy. There is no evidence biliary dilatation.  Pancreas: Unremarkable  Spleen: Unremarkable  Adrenals/Urinary Tract: Mild bilateral renal atrophy again identified. The kidneys, adrenal glands and bladder are otherwise unremarkable. There is no evidence of hydronephrosis or urinary calculi.  Stomach/Bowel: A small hiatal hernia is again noted. There is no evidence of bowel obstruction or focal bowel wall thickening. Descending and sigmoid colonic diverticulosis noted without evidence of diverticulitis.  Vascular/Lymphatic: Aortic atherosclerotic calcifications noted without aneurysm. No enlarged lymph nodes are identified.  Reproductive: The patient is status post hysterectomy. The adnexal regions are unremarkable.  Other: No free fluid or pneumoperitoneum. Periumbilical surgical changes are identified. There is no evidence of ventral hernia.  Musculoskeletal: No acute abnormalities identified. Multilevel mild degenerative  disc disease and moderate facet arthropathy in the lumbar spine noted.  IMPRESSION: No evidence of acute abnormality. No evidence of urinary calculi or hydronephrosis.  Small hiatal hernia and mild bilateral renal atrophy again noted.  Multilevel degenerative changes within the lumbar spine.  Coronary artery disease and aortic atherosclerosis.   Electronically Signed   By: Margarette Canada M.D.   On: 06/04/2015 12:09   Dg  Chest Port 1 View  06/04/2015   CLINICAL DATA:  Sepsis.  Low-grade fever.  EXAM: PORTABLE CHEST 1 VIEW  COMPARISON:  04/27/2015  FINDINGS: Right PICC line tip:  SVC.  Atherosclerotic aortic arch. Thoracic spondylosis. Upper normal heart size.  The lungs appear clear.  IMPRESSION: 1. Atherosclerosis. 2. Right PICC line tip:  SVC.   Electronically Signed   By: Van Clines M.D.   On: 06/04/2015 10:55    Anti-infectives: Anti-infectives    None       Assessment/Plan S/p abdominal wound exploration and explantation of infected mesh 04/28/15 - Tsuei EC fistula - recurrent -NPO, resume TPN -accurate intake and output -pain control  -wound looks good. Surrounding skin is broken down from the bilious drainage. Appreciate WOC management. Has an eakin's pouch to suction. No output recorded. 250ml recorded and about 139mL in the cannister. -May need to consider restarting standostatin  AKI-likely dehydration, medicine managing  Dispo-she will need SNF placement as she and her daughter cannot manage the wound/fistula at home, and she needs suction to help the Eakins stick again.     LOS: 2 days    [name] 06/06/2015, 7:22 AM Pager: [phone]

## 2015-06-06 NOTE — Evaluation (Signed)
Clinical/Bedside Swallow Evaluation Patient Details  Name: Naziya Hegwood MRN: [id number] Date of Birth: 1941-03-11  Today's Date: 06/06/2015 Time: SLP Start Time (ACUTE ONLY): 1141 SLP Stop Time (ACUTE ONLY): 1152 SLP Time Calculation (min) (ACUTE ONLY): 11 min  Past Medical History:  Past Medical History  Diagnosis Date  . Hypertension   . Hyperlipidemia   . History of DVT (deep vein thrombosis)   . History of colonic polyps   . Gout   . Polyarthritis   . Microcytic anemia   . GERD (gastroesophageal reflux disease)   . Esophagitis   . Hidradenitis suppurativa     s/p axillary sweat gland removal  . H/O blood transfusion reaction     at [facility] hospital  . Diverticulitis   . Difficulty sleeping   . PMB (postmenopausal bleeding)     X 2 YRS  . Bowel trouble     OCCASIONAL BOWEL INCONTINENCE  . Fibroids     s/p TAH/BSO 07/2012  . CAD (coronary artery disease)     a. 8/07 had BMS to OM.  b. In-stent restenosis with later Promus DES to same site.  c. Lexiscan myoview in 1/13 showed EF 67%, no ischemia or infarction.  d. Echo (2/13) showed EF 55-60%, moderate LVH, mild MR.  e. Lex MV 6/14: no isch, EF 53%;  f. Echo 6/14: mild LVH, EF 55-60%, Gr 1 DD, MAC, mild MR, mild LAE  . Peripheral neuropathy   . Chronic diastolic CHF (congestive heart failure) 06/29/2014  . Renal insufficiency     baseline Cr ~ 1.3  . Chronic kidney disease (CKD), stage III (moderate)     Archie Endo 04/27/2015  . OSA on CPAP     "wear it sometimes" (04/27/2015)  . History of hiatal hernia   . URKYHCWC(376.2)     "weekly" (04/27/2015)  . Enterocutaneous fistula     "have had it 2-3 months now" 04/27/2015  . Type II diabetes mellitus     diet controlled: pt's daughter denies mother's hx of diabetes   Past Surgical History:  Past Surgical History  Procedure Laterality Date  . Anterior cervical decomp/discectomy fusion    . Cervical laminectomy    . Umbilical hernia repair    . Axillary hidradenitis excision  Bilateral   . Knee arthroscopy Right   . Colectomy Left 2004    left partial for perforation  . Cholecystectomy    . Tubal ligation    . Coronary angioplasty with stent placement      of mid and distal circumflex coronary artery. 1st stent was 04/2006, with drug eluting stent placed on 12/20/07 (30-50% RCA stenosis). Cardiologist Dr. Einar Gip.  . Dilation and curettage of uterus  02/2011  . Hysteroscopy w/d&c  05/04/2012    Procedure: DILATATION AND CURETTAGE /HYSTEROSCOPY;  Surgeon: Lahoma Crocker, MD;  Location: [facility];  Service: Gynecology;  Laterality: N/A;  . Robotic assisted total hysterectomy with bilateral salpingo oopherectomy  07/24/2012    Procedure: ROBOTIC ASSISTED TOTAL HYSTERECTOMY WITH BILATERAL SALPINGO OOPHORECTOMY;  Surgeon: Janie Morning, MD PHD;  Location: [facility];  Service: Gynecology;  Laterality: Bilateral;  attempted ,  converted to abdomnial hysterectomy  . Abdominal hysterectomy  07/24/2012    Procedure: HYSTERECTOMY ABDOMINAL;  Surgeon: Janie Morning, MD PHD;  Location: [facility];  Service: Gynecology;;  . Salpingoophorectomy  07/24/2012    Procedure: SALPINGO OOPHORECTOMY;  Surgeon: Janie Morning, MD PHD;  Location: [facility];  Service: Gynecology;  Laterality: Bilateral;  . Abdominal wound dehiscence  08/01/2012  Procedure: ABDOMINAL WOUND DEHISCENCE;  Surgeon: Lahoma Crocker, MD;  Location: [facility];  Service: Gynecology;  Laterality: N/A;  . Laparotomy  08/02/2012    Procedure: EXPLORATORY LAPAROTOMY;  Surgeon: Imogene Burn. Georgette Dover, MD;  Location: [facility];  Service: General;  Laterality: N/A;  placement of abdominal wound vac  . Colonoscopy N/A 12/22/2014    Procedure: COLONOSCOPY;  Surgeon: Juanita Craver, MD;  Location: [facility];  Service: Endoscopy;  Laterality: N/A;  . Esophagogastroduodenoscopy Left 01/13/2015    Procedure: ESOPHAGOGASTRODUODENOSCOPY (EGD);  Surgeon: Carol Ada, MD;  Location: [facility];  Service: Endoscopy;  Laterality: Left;  . Back surgery     . Hernia repair    . Laparotomy N/A 04/28/2015    Procedure: Abdominal Wound Explororation, Explantation of Abdominal Mesh;  Surgeon: Donnie Mesa, MD;  Location: [facility];  Service: General;  Laterality: N/A;  . Cardiac surgery     HPI:  74 year old female with hypertension, hyperlipidemia, GERD, coronary artery disease, stage III chronic kidney disease, and heart failure. The patient also has a complex surgical history. She is status post left hemicolectomy with left transverse colon to sigmoid colon anastomosis for diverticulitis, umbilical hernia repair, repair of incarcerated ventral incisional hernia in 2009, open abdominal hysterectomy in 2013, abdominal wound exploration of enterocutaneous fistula and abscess in 04/28/2015 by Dr.Tsuei. Patient has had an ostomy that as high output. Patient was brought to the emergency department due to increased confusion, dehydration.  Currently NPO, TPN.   Daughter would like to give her mother fluids; asked by Dr. Erlinda Hong to evaluate swallow.    Assessment / Plan / Recommendation Clinical Impression  Swallow assessment limited to clear liquid boluses.  Pt alert but drowsy; partially reclined in bed.  Presents with normal CN function; adequate oral control; brisk swallow response with no overt s/s of aspiration when drinking water from a straw.  TPN has been resumed. Despite encephalopathy, pt appears to be protecting airway when consuming liquids.  No dysphagia noted.  Recommend allowing clear liquids per MD discretion; pt should be alert with HOB in upright position.  No SLP f/u needed - will sign off.      Aspiration Risk  Mild    Diet Recommendation  (thin liquids per MD approval)        Other  Recommendations Oral Care Recommendations: Oral care QID    Swallow Study Prior Functional Status       General Date of Onset: 06/04/15 Other Pertinent Information: 74 year old female with hypertension, hyperlipidemia, GERD, coronary artery disease, stage  III chronic kidney disease, and heart failure. The patient also has a complex surgical history. She is status post left hemicolectomy with left transverse colon to sigmoid colon anastomosis for diverticulitis, umbilical hernia repair, repair of incarcerated ventral incisional hernia in 2009, open abdominal hysterectomy in 2013, abdominal wound exploration of enterocutaneous fistula and abscess in 04/28/2015 by Dr.Tsuei. Patient has had an ostomy that as high output. Patient was brought to the emergency department due to increased confusion, dehydration.  Currently NPO, TPN.   Daughter has been wanting to give her mother fluids; asked by Dr. Erlinda Hong to evaluate swallow.  Type of Study: Bedside swallow evaluation Previous Swallow Assessment: none per records Diet Prior to this Study: NPO (TPN) Temperature Spikes Noted: No Respiratory Status: Room air History of Recent Intubation: No Behavior/Cognition: Alert Oral Cavity - Dentition: Adequate natural dentition/normal for age Self-Feeding Abilities: Needs assist Patient Positioning: Partially reclined Baseline Vocal Quality: Normal Volitional Cough: Weak Volitional  Swallow: Able to elicit    Oral/Motor/Sensory Function Overall Oral Motor/Sensory Function: Appears within functional limits for tasks assessed   Ice Chips Ice chips: Within functional limits   Thin Liquid Thin Liquid: Within functional limits Presentation: Straw    Nectar Thick Nectar Thick Liquid: Not tested   Honey Thick Honey Thick Liquid: Not tested   Puree Puree: Not tested   Solid   GO    Solid: Not tested       Juan Quam Laurice 06/06/2015,12:05 PM

## 2015-06-06 NOTE — Evaluation (Signed)
Physical Therapy Evaluation Patient Details Name: Marissa [name] MRN: [id number] DOB: 1940/10/29 Today's Date: 06/06/2015   History of Present Illness  Patient is a 74 yo female admitted 06/04/15 with AMS, abdominal pain, dehydration, weakness, abdominal wounds.  PMH:  HTN, arthritis, CAD, DM, neuropathy, CHF, CKD, ostomy  Clinical Impression  Patient presents with problems listed below.  Will benefit from acute PT to address mobility prior to discharge.  Patient very lethargic today, difficulty verbalizing.  Required +2 assist for bed mobility.  Will continue to follow - hope for increased participation as patient becomes more alert.  Recommend SNF at discharge for continued therapy.    Follow Up Recommendations SNF;Supervision/Assistance - 24 hour    Equipment Recommendations  None recommended by PT    Recommendations for Other Services       Precautions / Restrictions Precautions Precautions: Fall Restrictions Weight Bearing Restrictions: No      Mobility  Bed Mobility Overal bed mobility: Needs Assistance;+2 for physical assistance Bed Mobility: Rolling Rolling: Total assist;+2 for physical assistance         General bed mobility comments: Required hand-over-hand to facilitate patient reaching for rail.  Patient did not assist further with mobility.  Requiring +2 total assist for bed mobility.  Transfers                 General transfer comment: Did not attempt due to level of arousal  Ambulation/Gait                Stairs            Wheelchair Mobility    Modified Rankin (Stroke Patients Only)       Balance                                             Pertinent Vitals/Pain Pain Assessment: Faces Faces Pain Scale: Hurts little more (with mobility) Pain Location: Assume abdomen Pain Intervention(s): Limited activity within patient's tolerance;Repositioned    Home Living Family/patient expects to be discharged to::  [place]: Kasandra Knudsen - single point;Walker - 2 wheels;Walker - 4 wheels;Bedside commode;Wheelchair - manual;Shower seat - built in      Prior Function Level of Independence: Needs assistance   Gait / Transfers Assistance Needed: Note from prior admit in August 2016, patient ambulates 800' with no assistive device.  Unsure of mobility status since d/c.           Hand Dominance        Extremity/Trunk Assessment   Upper Extremity Assessment: RUE deficits/detail;LUE deficits/detail RUE Deficits / Details: Strength grossly 3-/5     LUE Deficits / Details: Strength grossly 2+/5   Lower Extremity Assessment: Difficult to assess due to impaired cognition (Patient able to move toes-no other movement to commands)         Communication   Communication: Expressive difficulties (Mumbled/garbled speech - unable to understand)  Cognition Arousal/Alertness: Lethargic Behavior During Therapy: Flat affect Overall Cognitive Status: Difficult to assess                      General Comments      Exercises        Assessment/Plan    PT Assessment Patient needs continued PT services  PT Diagnosis Difficulty  walking;Generalized weakness;Altered mental status   PT Problem List Decreased strength;Decreased activity tolerance;Decreased balance;Decreased mobility;Decreased cognition;Decreased knowledge of use of DME;Obesity;Decreased skin integrity  PT Treatment Interventions DME instruction;Gait training;Functional mobility training;Therapeutic activities;Therapeutic exercise;Cognitive remediation;Patient/family education   PT Goals (Current goals can be found in the Care Plan section) Acute Rehab PT Goals Patient Stated Goal: Unable to state PT Goal Formulation: Patient unable to participate in goal setting Time For Goal Achievement: 06/20/15 Potential to Achieve Goals: Fair    Frequency Min 2X/week    Barriers to discharge Decreased caregiver support Do not feel daughter can provide level of care patient requires.    Co-evaluation               End of Session   Activity Tolerance: Patient limited by lethargy Patient left: in bed;with call bell/phone within reach;with bed alarm set           Time: 3912-2583 PT Time Calculation (min) (ACUTE ONLY): 11 min   Charges:   PT Evaluation $Initial PT Evaluation Tier I: 1 Procedure     PT G CodesDespina Pole 2015-06-21, 6:39 PM Carita Pian. Sanjuana Kava, [place] Pager [phone]

## 2015-06-06 NOTE — Progress Notes (Addendum)
Patient has refused her Lovenox injection for tonight.  But she has been recieving it previous nights

## 2015-06-06 NOTE — Progress Notes (Signed)
PROGRESS NOTE  Marissa [name] PJA:[id number] DOB: 08/04/1941 DOA: 06/04/2015 PCP: [name], MD  HPI/Recap of past 24 hours:  Very weak and frail female, but seems slightly better, more alter, know she is in the hospital, daughter at bedside,  Daughter reported patient started has speech problem for a few days prior to this hospitalization.   Assessment/Plan: Active Problems:   Dehydration   Encephalopathy   Abdominal pain   Acute renal failure  Confusion/encephalopathy: improving with hydration. Will check CT head.  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 Will investigate, patient bp low normal, will d/c scheduled dose, change to prn iv lopressor for sbp >160 or heart rate >120. Will monitor.  H/o chronic anemia, will close monitor, no need of transfusion yet. (h/o transfusion reaction per chart, but daughter denies)  H/o NIDDM2: last A1c 5.7 in 03/2015, on ssi here  H/o gout: stable, colchicine held due to ARF, consider uloric at discharge  Chronic enterocutaneuos fistula: s/p Eakin pouch, difficult seal , currently on LWS, npo and TPN. management per woc and general surgery. May consider sandostatin per surgery  FTT: need snf.  Code Status: full, confirmed with daughter  Family Communication: patient and daughter  Disposition Plan: SNF   Consultants:  General surgery  WOC  Procedures:  TPN  Antibiotics:  none   Objective: BP 110/50 mmHg  Pulse 89  Temp(Src) 99.2 F (37.3 C) (Oral)  Resp 19  Ht 5\' 4"  (1.626 m)  Wt 225 lb (102.059 kg)  BMI 38.60 kg/m2  SpO2 99%  Intake/Output Summary (Last 24 hours) at 06/06/15 1454 Last data filed at 06/06/15 1451  Gross per 24 hour  Intake 1863.67 ml  Output    225 ml  Net 1638.67 ml   Filed Weights   06/04/15 1003  Weight: 225 lb (102.059 kg)    Exam:   General:  NAD  Cardiovascular: RRR  Respiratory: CTABL  Abdomen: Eakin pouch coneected  to LWS, positive BS  Musculoskeletal: No Edema  Neuro: lethargic, follow command, oriented to person, time and place. More alert today on 9/24  Data Reviewed: Basic Metabolic Panel:  Recent Labs Lab 06/04/15 1050 06/04/15 1113 06/05/15 0500 06/06/15 0522  NA 138 141 141 144  K 5.8* 5.7* 5.0 4.7  CL 120* 123* 128* 129*  CO2 6*  --  9* 9*  GLUCOSE 111* 112* 114* 111*  BUN 130* 133* 105* 98*  CREATININE 2.13* 2.20* 2.03* 1.97*  CALCIUM 10.4*  --  9.6 9.5  MG  --   --  2.7* 2.0  PHOS  --   --  6.2* 3.6   Liver Function Tests:  Recent Labs Lab 06/04/15 1050 06/05/15 0500  AST 54* 35  ALT 108* 84*  ALKPHOS 114 107  BILITOT 0.2* 0.5  PROT 7.5 6.4*  ALBUMIN 2.9* 2.5*   No results for input(s): LIPASE, AMYLASE in the last 168 hours. No results for input(s): AMMONIA in the last 168 hours. CBC:  Recent Labs Lab 06/04/15 1050 06/04/15 1113 06/05/15 0500  WBC 11.9*  --  8.1  NEUTROABS 9.2*  --  5.9  HGB 8.9* 9.5* 7.8*  HCT 27.6* 28.0* 24.2*  MCV 71.1*  --  70.3*  PLT 271  --  239   Cardiac Enzymes:   No results for input(s): CKTOTAL, CKMB, CKMBINDEX, TROPONINI in the last 168 hours. BNP (last 3 results)  Recent Labs  11/02/14 2340 03/19/15 1325  BNP  109.2* 141.6*    ProBNP (last 3 results) No results for input(s): PROBNP in the last 8760 hours.  CBG:  Recent Labs Lab 06/05/15 2252 06/06/15 0751 06/06/15 1135  GLUCAP 119* 80 116*    Recent Results (from the past 240 hour(s))  Urine culture     Status: None   Collection Time: 06/04/15 10:39 AM  Result Value Ref Range Status   Specimen Description URINE, CATHETERIZED  Final   Special Requests NONE  Final   Culture NO GROWTH 1 DAY  Final   Report Status 06/05/2015 FINAL  Final  Blood Culture (routine x 2)     Status: None (Preliminary result)   Collection Time: 06/04/15 10:50 AM  Result Value Ref Range Status   Specimen Description BLOOD PICC LINE  Final   Special Requests BOTTLES DRAWN  AEROBIC AND ANAEROBIC 5CC  Final   Culture NO GROWTH 2 DAYS  Final   Report Status PENDING  Incomplete  Blood Culture (routine x 2)     Status: None (Preliminary result)   Collection Time: 06/04/15 11:00 AM  Result Value Ref Range Status   Specimen Description BLOOD RIGHT HAND  Final   Special Requests BOTTLES DRAWN AEROBIC AND ANAEROBIC 5CC  Final   Culture NO GROWTH 2 DAYS  Final   Report Status PENDING  Incomplete     Studies: Ct Head Wo Contrast  06/06/2015   CLINICAL DATA:  Abnormal speech.  EXAM: CT HEAD WITHOUT CONTRAST  TECHNIQUE: Contiguous axial images were obtained from the base of the skull through the vertex without intravenous contrast.  COMPARISON:  12/28/2014  FINDINGS: Mild generalized cerebral atrophy is normal for age. There is no evidence of acute cortical infarct, intracranial hemorrhage, mass, midline shift, or extra-axial fluid collection. Periventricular white-matter hypodensities are unchanged and nonspecific but compatible with mild chronic small vessel ischemic disease.  Orbits are unremarkable. Visualized paranasal sinuses and mastoid air cells are clear. Mild atherosclerotic calcification is noted at the skullbase.  IMPRESSION: No evidence of acute intracranial abnormality.   Electronically Signed   By: Logan Bores M.D.   On: 06/06/2015 13:22    Scheduled Meds: . dextrose 5 % and 0.9% NaCl   Intravenous Q24H  . dextrose 5 % and 0.9% NaCl   Intravenous Q24H  . enoxaparin (LOVENOX) injection  30 mg Subcutaneous Q24H  . insulin aspart  0-9 Units Subcutaneous 3 times per day  . metoprolol tartrate  25 mg Oral q morning - 10a  . nystatin  5 mL Oral QID  . pantoprazole  40 mg Oral Daily    Continuous Infusions: . [START ON 06/07/2015] dextrose 5 % and 0.9% NaCl    . TPN (CLINIMIX) Adult without lytes Stopped (06/06/15 9476)   And  . fat emulsion 240 mL (06/05/15 1828)  . TPN (CLINIMIX) Adult without lytes     And  . fat emulsion       Time spent:  29mins  Xu,Fang MD, PhD  [facility] Pager [phone]. If 7PM-7AM, please contact night-coverage at [email or web address], password [facility] 06/06/2015, 2:54 PM  LOS: 2 days

## 2015-06-07 DIAGNOSIS — R627 Adult failure to thrive: Secondary | ICD-10-CM

## 2015-06-07 LAB — BASIC METABOLIC PANEL
ANION GAP: 5 (ref 5–15)
BUN: 107 mg/dL — AB (ref 6–20)
CHLORIDE: 130 mmol/L — AB (ref 101–111)
CO2: 9 mmol/L — ABNORMAL LOW (ref 22–32)
Calcium: 9.4 mg/dL (ref 8.9–10.3)
Creatinine, Ser: 2.01 mg/dL — ABNORMAL HIGH (ref 0.44–1.00)
GFR calc Af Amer: 27 mL/min — ABNORMAL LOW (ref >60)
GFR, EST NON AFRICAN AMERICAN: 23 mL/min — AB (ref >60)
Glucose, Bld: 116 mg/dL — ABNORMAL HIGH (ref 65–99)
POTASSIUM: 4.7 mmol/L (ref 3.5–5.1)
SODIUM: 144 mmol/L (ref 135–145)

## 2015-06-07 LAB — GLUCOSE, CAPILLARY
GLUCOSE-CAPILLARY: 78 mg/dL (ref 65–99)
GLUCOSE-CAPILLARY: 108 mg/dL — AB (ref 65–99)
Glucose-Capillary: 74 mg/dL (ref 65–99)

## 2015-06-07 LAB — PHOSPHORUS: PHOSPHORUS: 3.2 mg/dL (ref 2.5–4.6)

## 2015-06-07 MED ORDER — TRACE MINERALS CR-CU-MN-SE-ZN 10-1000-500-60 MCG/ML IV SOLN
INTRAVENOUS | Status: AC
  Administered 2015-06-07: 17:00:00 via INTRAVENOUS
  Filled 2015-06-07: qty 1800

## 2015-06-07 MED ORDER — METOPROLOL TARTRATE 1 MG/ML IV SOLN
2.5000 mg | Freq: Once | INTRAVENOUS | Status: AC
  Administered 2015-06-07: 2.5 mg via INTRAVENOUS

## 2015-06-07 MED ORDER — PANTOPRAZOLE SODIUM 40 MG IV SOLR
40.0000 mg | Freq: Every day | INTRAVENOUS | Status: DC
  Administered 2015-06-07 – 2015-06-14 (×8): 40 mg via INTRAVENOUS
  Filled 2015-06-07 (×8): qty 40

## 2015-06-07 MED ORDER — SODIUM BICARBONATE 8.4 % IV SOLN
INTRAVENOUS | Status: AC
  Administered 2015-06-07 – 2015-06-08 (×2): via INTRAVENOUS
  Filled 2015-06-07 (×3): qty 100

## 2015-06-07 MED ORDER — INFLUENZA VAC SPLIT QUAD 0.5 ML IM SUSY
0.5000 mL | PREFILLED_SYRINGE | INTRAMUSCULAR | Status: AC
  Administered 2015-06-08: 0.5 mL via INTRAMUSCULAR
  Filled 2015-06-07: qty 0.5

## 2015-06-07 MED ORDER — FAT EMULSION 20 % IV EMUL
240.0000 mL | INTRAVENOUS | Status: AC
  Administered 2015-06-07: 240 mL via INTRAVENOUS
  Filled 2015-06-07 (×2): qty 250

## 2015-06-07 MED ORDER — DEXTROSE-NACL 5-0.9 % IV SOLN
INTRAVENOUS | Status: DC
  Administered 2015-06-07: 11:00:00 via INTRAVENOUS

## 2015-06-07 MED ORDER — MORPHINE SULFATE (PF) 2 MG/ML IV SOLN
1.0000 mg | INTRAVENOUS | Status: DC | PRN
  Administered 2015-06-07 – 2015-06-16 (×16): 1 mg via INTRAVENOUS
  Filled 2015-06-07 (×17): qty 1

## 2015-06-07 NOTE — Progress Notes (Addendum)
PROGRESS NOTE  Marissa [name] WSF:[id number] DOB: 1941-08-04 DOA: 06/04/2015 PCP: [name], MD  HPI/Recap of past 24 hours:  Very weak and frail female, open eyes, but not talking, attempt to follow command.   Assessment/Plan: Active Problems:   Dehydration   Encephalopathy   Abdominal pain   Acute renal failure  Confusion/encephalopathy: improving with hydration. CT head no acute findings. 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  Metabolic acidosis: start bicarb/d5 at 50cc/hr for 24hrs, repeat bmp in am  H/o HTN: on lopressor QD? Will investigate, patient bp low normal, will d/c scheduled dose, change to prn iv lopressor for sbp >160 or heart rate >120. Will monitor.  H/o chronic anemia, will close monitor, no need of transfusion yet. (h/o transfusion reaction per chart, but daughter denies)  H/o NIDDM2: last A1c 5.7 in 03/2015, on ssi here  H/o gout: stable, colchicine held due to ARF, consider uloric at discharge  Chronic enterocutaneuos fistula: s/p Eakin pouch, difficult seal , currently on LWS, npo and TPN. management per woc and general surgery. May consider sandostatin per surgery  FTT: prognosis guarded,  need snf.  Code Status: full, confirmed with daughter  Family Communication: patient , son and daughter in law, discussed with family member about poor prognosis.  Disposition Plan: SNF   Consultants:  General surgery  WOC  Procedures:  TPN  Antibiotics:  none   Objective: BP 99/61 mmHg  Pulse 102  Temp(Src) 97.7 F (36.5 C) (Oral)  Resp 18  Ht 5\' 4"  (1.626 m)  Wt 225 lb (102.059 kg)  BMI 38.60 kg/m2  SpO2 100%  Intake/Output Summary (Last 24 hours) at 06/07/15 1153 Last data filed at 06/07/15 1126  Gross per 24 hour  Intake 2966.75 ml  Output    350 ml  Net 2616.75 ml   Filed Weights   06/04/15 1003  Weight: 225 lb (102.059 kg)     Exam:   General: very frail, open eyes but minimal responsive, maintain airway  Cardiovascular: RRR  Respiratory: CTABL  Abdomen: Eakin pouch conected to LWS, positive BS  Musculoskeletal: No Edema  Neuro: very lethargic, not talking today, but open eyes  Data Reviewed: Basic Metabolic Panel:  Recent Labs Lab 06/04/15 1050 06/04/15 1113 06/05/15 0500 06/06/15 0522 06/07/15 0614  NA 138 141 141 144 144  K 5.8* 5.7* 5.0 4.7 4.7  CL 120* 123* 128* 129* 130*  CO2 6*  --  9* 9* 9*  GLUCOSE 111* 112* 114* 111* 116*  BUN 130* 133* 105* 98* 107*  CREATININE 2.13* 2.20* 2.03* 1.97* 2.01*  CALCIUM 10.4*  --  9.6 9.5 9.4  MG  --   --  2.7* 2.0  --   PHOS  --   --  6.2* 3.6 3.2   Liver Function Tests:  Recent Labs Lab 06/04/15 1050 06/05/15 0500  AST 54* 35  ALT 108* 84*  ALKPHOS 114 107  BILITOT 0.2* 0.5  PROT 7.5 6.4*  ALBUMIN 2.9* 2.5*   No results for input(s): LIPASE, AMYLASE in the last 168 hours. No results for input(s): AMMONIA in the last 168 hours. CBC:  Recent Labs Lab 06/04/15 1050 06/04/15 1113 06/05/15 0500  WBC 11.9*  --  8.1  NEUTROABS 9.2*  --  5.9  HGB 8.9* 9.5* 7.8*  HCT 27.6* 28.0* 24.2*  MCV 71.1*  --  70.3*  PLT 271  --  239  Cardiac Enzymes:   No results for input(s): CKTOTAL, CKMB, CKMBINDEX, TROPONINI in the last 168 hours. BNP (last 3 results)  Recent Labs  11/02/14 2340 03/19/15 1325  BNP 109.2* 141.6*    ProBNP (last 3 results) No results for input(s): PROBNP in the last 8760 hours.  CBG:  Recent Labs Lab 06/06/15 0751 06/06/15 1135 06/06/15 1657 06/06/15 2112 06/07/15 0752  GLUCAP 80 116* 93 138* 78    Recent Results (from the past 240 hour(s))  Urine culture     Status: None   Collection Time: 06/04/15 10:39 AM  Result Value Ref Range Status   Specimen Description URINE, CATHETERIZED  Final   Special Requests NONE  Final   Culture NO GROWTH 1 DAY  Final   Report Status 06/05/2015 FINAL  Final   Blood Culture (routine x 2)     Status: None (Preliminary result)   Collection Time: 06/04/15 10:50 AM  Result Value Ref Range Status   Specimen Description BLOOD PICC LINE  Final   Special Requests BOTTLES DRAWN AEROBIC AND ANAEROBIC 5CC  Final   Culture NO GROWTH 2 DAYS  Final   Report Status PENDING  Incomplete  Blood Culture (routine x 2)     Status: None (Preliminary result)   Collection Time: 06/04/15 11:00 AM  Result Value Ref Range Status   Specimen Description BLOOD RIGHT HAND  Final   Special Requests BOTTLES DRAWN AEROBIC AND ANAEROBIC 5CC  Final   Culture NO GROWTH 2 DAYS  Final   Report Status PENDING  Incomplete     Studies: Ct Head Wo Contrast  06/06/2015   CLINICAL DATA:  Abnormal speech.  EXAM: CT HEAD WITHOUT CONTRAST  TECHNIQUE: Contiguous axial images were obtained from the base of the skull through the vertex without intravenous contrast.  COMPARISON:  12/28/2014  FINDINGS: Mild generalized cerebral atrophy is normal for age. There is no evidence of acute cortical infarct, intracranial hemorrhage, mass, midline shift, or extra-axial fluid collection. Periventricular white-matter hypodensities are unchanged and nonspecific but compatible with mild chronic small vessel ischemic disease.  Orbits are unremarkable. Visualized paranasal sinuses and mastoid air cells are clear. Mild atherosclerotic calcification is noted at the skullbase.  IMPRESSION: No evidence of acute intracranial abnormality.   Electronically Signed   By: Logan Bores M.D.   On: 06/06/2015 13:22    Scheduled Meds: . enoxaparin (LOVENOX) injection  30 mg Subcutaneous Q24H  . [START ON 06/08/2015] Influenza vac split quadrivalent PF  0.5 mL Intramuscular Tomorrow-1000  . insulin aspart  0-9 Units Subcutaneous 3 times per day  . nystatin  5 mL Oral QID  . pantoprazole  40 mg Oral Daily    Continuous Infusions: . TPN (CLINIMIX) Adult without lytes Stopped (06/07/15 0612)   And  . fat emulsion Stopped  (06/07/15 4765)  . TPN (CLINIMIX) Adult without lytes     And  . fat emulsion    .  sodium bicarbonate  infusion 1000 mL       Time spent: 35mins  Xu,Fang MD, PhD  [facility] Pager [phone]. If 7PM-7AM, please contact night-coverage at [email or web address], password [facility] 06/07/2015, 11:53 AM  LOS: 3 days

## 2015-06-07 NOTE — Progress Notes (Signed)
[facility] Progress Note     Subjective: Not very alert, very sleepy.  Denies pain or N/V.  Dressing is cleaner today.  Eakins pouch with NG tube in place with greenish/yellow drainage.  Doesn't appear to be leaking as badly as yesterday.  Objective: Vital signs in last 24 hours: Temp:  [97.8 F (36.6 C)-99.2 F (37.3 C)] 98.2 F (36.8 C) (09/25 0501) Pulse Rate:  [84-104] 84 (09/25 0501) Resp:  [18-20] 18 (09/25 0501) BP: (99-110)/(50-77) 108/77 mmHg (09/25 0501) SpO2:  [99 %-100 %] 100 % (09/25 0501) Last BM Date:  (PTA )  Intake/Output from previous day: 09/24 0701 - 09/25 0700 In: 2966.8 [I.V.:1074.3; TPN:1892.5] Out: 250 [Drains:250] Intake/Output this shift:    PE: Gen:  Alert, NAD, pleasant Abd: Obese, soft, mild tenderness, ND, +BS, no HSM, wound with Eakins pouch, greenish yellow drainage in canister, dressing is dry, no current leaking noted  Lab Results:   Recent Labs  06/04/15 1050 06/04/15 1113 06/05/15 0500  WBC 11.9*  --  8.1  HGB 8.9* 9.5* 7.8*  HCT 27.6* 28.0* 24.2*  PLT 271  --  239   BMET  Recent Labs  06/06/15 0522 06/07/15 0614  NA 144 144  K 4.7 4.7  CL 129* 130*  CO2 9* 9*  GLUCOSE 111* 116*  BUN 98* 107*  CREATININE 1.97* 2.01*  CALCIUM 9.5 9.4   PT/INR No results for input(s): LABPROT, INR in the last 72 hours. CMP     Component Value Date/Time   NA 144 06/07/2015 0614   K 4.7 06/07/2015 0614   CL 130* 06/07/2015 0614   CO2 9* 06/07/2015 0614   GLUCOSE 116* 06/07/2015 0614   BUN 107* 06/07/2015 0614   CREATININE 2.01* 06/07/2015 0614   CREATININE 1.64* 06/27/2014 1647   CALCIUM 9.4 06/07/2015 0614   PROT 6.4* 06/05/2015 0500   ALBUMIN 2.5* 06/05/2015 0500   AST 35 06/05/2015 0500   ALT 84* 06/05/2015 0500   ALKPHOS 107 06/05/2015 0500   BILITOT 0.5 06/05/2015 0500   GFRNONAA 23* 06/07/2015 0614   GFRAA 27* 06/07/2015 0614   Lipase     Component Value Date/Time   LIPASE 27 12/26/2014 1815        Studies/Results: Ct Head Wo Contrast  06/06/2015   CLINICAL DATA:  Abnormal speech.  EXAM: CT HEAD WITHOUT CONTRAST  TECHNIQUE: Contiguous axial images were obtained from the base of the skull through the vertex without intravenous contrast.  COMPARISON:  12/28/2014  FINDINGS: Mild generalized cerebral atrophy is normal for age. There is no evidence of acute cortical infarct, intracranial hemorrhage, mass, midline shift, or extra-axial fluid collection. Periventricular white-matter hypodensities are unchanged and nonspecific but compatible with mild chronic small vessel ischemic disease.  Orbits are unremarkable. Visualized paranasal sinuses and mastoid air cells are clear. Mild atherosclerotic calcification is noted at the skullbase.  IMPRESSION: No evidence of acute intracranial abnormality.   Electronically Signed   By: Logan Bores M.D.   On: 06/06/2015 13:22    Anti-infectives: Anti-infectives    None       Assessment/Plan S/p abdominal wound exploration and explantation of infected mesh 04/28/15 - [place] EC fistula - recurrent -STRICT NPO, TPN to try to allow fistula to heal.  We are keeping her NPO due to the fistula not because of dysphagia. -accurate intake and output, output appears much lower than previously -pain control  -wound looks good. Surrounding skin is broken down from the bilious drainage. Appreciate WOC management.  Has an eakin's pouch to NG suction. 257ml recorded/24hr. -May need to consider restarting standostatin if out put goes up  AKI-likely dehydration, medicine managing  Dispo-she will need SNF placement as she and her daughter cannot manage the wound/fistula at home, and she needs suction to help the Eakins stick again.    LOS: 3 days    [name] 06/07/2015, 7:46 AM Pager: [phone]

## 2015-06-07 NOTE — Progress Notes (Addendum)
PARENTERAL NUTRITION CONSULT NOTE - FOLLOW UP  Pharmacy Consult for TPN Indication: Chronic EC fistula, on home TPN  No Known Allergies  Patient Measurements: Height: 5\' 4"  (162.6 cm) Weight: 225 lb (102.059 kg) IBW/kg (Calculated) : 54.7 Adjusted Body Weight:  Usual Weight:   Vital Signs: Temp: 97.7 F (36.5 C) (09/25 0753) Temp Source: Oral (09/25 0753) BP: 99/61 mmHg (09/25 0753) Pulse Rate: 102 (09/25 0753) Intake/Output from previous day: 09/24 0701 - 09/25 0700 In: 2966.8 [I.V.:1074.3; TPN:1892.5] Out: 250 [Drains:250] Intake/Output from this shift: Total I/O In: 0  Out: 100 [Drains:100]  Labs:  Recent Labs  06/04/15 1050 06/04/15 1113 06/05/15 0500  WBC 11.9*  --  8.1  HGB 8.9* 9.5* 7.8*  HCT 27.6* 28.0* 24.2*  PLT 271  --  239     Recent Labs  06/04/15 1050  06/05/15 0500 06/06/15 0522 06/07/15 0614  NA 138  < > 141 144 144  K 5.8*  < > 5.0 4.7 4.7  CL 120*  < > 128* 129* 130*  CO2 6*  --  9* 9* 9*  GLUCOSE 111*  < > 114* 111* 116*  BUN 130*  < > 105* 98* 107*  CREATININE 2.13*  < > 2.03* 1.97* 2.01*  CALCIUM 10.4*  --  9.6 9.5 9.4  MG  --   --  2.7* 2.0  --   PHOS  --   --  6.2* 3.6 3.2  PROT 7.5  --  6.4*  --   --   ALBUMIN 2.9*  --  2.5*  --   --   AST 54*  --  35  --   --   ALT 108*  --  84*  --   --   ALKPHOS 114  --  107  --   --   BILITOT 0.2*  --  0.5  --   --   PREALBUMIN  --   --  31.1  --   --   TRIG  --   --  63  --   --   < > = values in this interval not displayed. Estimated Creatinine Clearance: 28.6 mL/min (by C-G formula based on Cr of 2.01).    Recent Labs  06/06/15 1657 06/06/15 2112 06/07/15 0752  GLUCAP 93 138* 78    Medications:  Scheduled:  . dextrose 5 % and 0.9% NaCl   Intravenous Q24H  . enoxaparin (LOVENOX) injection  30 mg Subcutaneous Q24H  . [START ON 06/08/2015] Influenza vac split quadrivalent PF  0.5 mL Intramuscular Tomorrow-1000  . insulin aspart  0-9 Units Subcutaneous 3 times per day  .  nystatin  5 mL Oral QID  . pantoprazole  40 mg Oral Daily    Insulin Requirements in the past 24 hours:  1 units Sens SSI, no insulin in TPN No insulin in home TPN  Assessment: 18 YOF with an extensive surgical history to continue on TPN for EC fistula. CT shows no acute abnormality. I have contacted [facility] this AM. Patient is on cyclic compounded 3:1 TPN over 12 hours at home which provides ~1900 kcal and 90g protein which also contains electrolytes.   Admit: AMS, abd pain  Surgeries/Procedures: hx left hemicolectomy with left transverse colon to sigmoid colon anastomosis for diverticulitis, umbilical hernia repair, repair of incarcerated ventral incisional hernia in 2009, open abd hysterectomy in 2013, abdominal wound exploration of EC fistula and abscess in 04/28/2015  GI: GERD / esophagitis / diverticulitis / occasional  bowel incontinence / high output EC fistula. Fistula with bilious output. NPO to allow fistula to heal.  Abd soft, non-tender. (-)N/V. Eakin's pouch to suction with 268mL out, less leaking. Considering Sandostatin Endo: gout. DM2- no insulin in home TPN. CBG 138 on TPN, 78,93 off TPN. Lytes: K 4.7, Phos 3.2 with no lytes in TPN; stable.  Renal: CKD3 - Cr 2.01 (baseline 1.23, early Sept was 1.45). UOP x 1 recorded.  Receiving D5NS at 85ml/hr OFF TPN & KVO ON TPN Pulm: OSA on CPAP. Currently RA Cards: HTN / HLD / CAD / CHF (EF 55-60%). BP soft, a bit tachy Hepatobil: AST/ALT elevated, tbili WNL. Prealbumin 31.1- home TPN is meeting nutritional needs Neuro: AMS improving. Sleepy ID: WBC wnl, AFeb - not on abx  Best Practices: Lovenox TPN Access: PICC line placed 04/27/15 TPN start date: 9/23 >>  Current Nutrition:  Full liquid diet Cyclic TPN at home (last 9/21)  Plan:  -Clinimix 5/15 (no lytes) 1837mL and Intralipid 20% 228mL, both to run over 12 hours and providing ~1800 kcal and 90g protein & closely approximating home TPN -BMet,  Mg, Phos in AM -Add back lytes as able, may require alternating regimen of Clinimix while here -Paged Dr Erlinda Hong re: current fluid order of 3090mL (IVF + TPN) since admit.  Will decr IVF to [facility] at all times for new total fluid ~2L. -D/C [place] & [place], PharmD Clinical Pharmacist [facility]

## 2015-06-08 ENCOUNTER — Inpatient Hospital Stay (HOSPITAL_COMMUNITY): Payer: Medicare Other

## 2015-06-08 DIAGNOSIS — N170 Acute kidney failure with tubular necrosis: Secondary | ICD-10-CM | POA: Diagnosis not present

## 2015-06-08 LAB — CBC
HCT: 22.9 % — ABNORMAL LOW (ref 36.0–46.0)
Hemoglobin: 7.2 g/dL — ABNORMAL LOW (ref 12.0–15.0)
MCH: 22.4 pg — ABNORMAL LOW (ref 26.0–34.0)
MCHC: 31.4 g/dL (ref 30.0–36.0)
MCV: 71.1 fL — ABNORMAL LOW (ref 78.0–100.0)
PLATELETS: 189 10*3/uL (ref 150–400)
RBC: 3.22 MIL/uL — AB (ref 3.87–5.11)
RDW: 17.2 % — ABNORMAL HIGH (ref 11.5–15.5)
WBC: 10.2 10*3/uL (ref 4.0–10.5)

## 2015-06-08 LAB — GLUCOSE, CAPILLARY
GLUCOSE-CAPILLARY: 123 mg/dL — AB (ref 65–99)
GLUCOSE-CAPILLARY: 131 mg/dL — AB (ref 65–99)
GLUCOSE-CAPILLARY: 162 mg/dL — AB (ref 65–99)
GLUCOSE-CAPILLARY: 207 mg/dL — AB (ref 65–99)
GLUCOSE-CAPILLARY: 95 mg/dL (ref 65–99)
Glucose-Capillary: 68 mg/dL (ref 65–99)

## 2015-06-08 LAB — COMPREHENSIVE METABOLIC PANEL
ALBUMIN: 2.2 g/dL — AB (ref 3.5–5.0)
ALT: 58 U/L — ABNORMAL HIGH (ref 14–54)
ANION GAP: 7 (ref 5–15)
AST: 29 U/L (ref 15–41)
Alkaline Phosphatase: 104 U/L (ref 38–126)
BILIRUBIN TOTAL: 0.3 mg/dL (ref 0.3–1.2)
BUN: 122 mg/dL — ABNORMAL HIGH (ref 6–20)
CHLORIDE: 125 mmol/L — AB (ref 101–111)
CO2: 8 mmol/L — ABNORMAL LOW (ref 22–32)
Calcium: 9 mg/dL (ref 8.9–10.3)
Creatinine, Ser: 2.5 mg/dL — ABNORMAL HIGH (ref 0.44–1.00)
GFR calc Af Amer: 21 mL/min — ABNORMAL LOW (ref >60)
GFR, EST NON AFRICAN AMERICAN: 18 mL/min — AB (ref >60)
Glucose, Bld: 151 mg/dL — ABNORMAL HIGH (ref 65–99)
POTASSIUM: 4 mmol/L (ref 3.5–5.1)
Sodium: 140 mmol/L (ref 135–145)
TOTAL PROTEIN: 6.3 g/dL — AB (ref 6.5–8.1)

## 2015-06-08 LAB — DIFFERENTIAL
BASOS ABS: 0 10*3/uL (ref 0.0–0.1)
Basophils Relative: 0 %
Eosinophils Absolute: 0.2 10*3/uL (ref 0.0–0.7)
Eosinophils Relative: 2 %
LYMPHS PCT: 7 %
Lymphs Abs: 0.7 10*3/uL (ref 0.7–4.0)
Monocytes Absolute: 1.1 10*3/uL — ABNORMAL HIGH (ref 0.1–1.0)
Monocytes Relative: 11 %
NEUTROS ABS: 8.2 10*3/uL — AB (ref 1.7–7.7)
NEUTROS PCT: 80 %

## 2015-06-08 LAB — MAGNESIUM: MAGNESIUM: 1.6 mg/dL — AB (ref 1.7–2.4)

## 2015-06-08 LAB — PHOSPHORUS: Phosphorus: 2.8 mg/dL (ref 2.5–4.6)

## 2015-06-08 LAB — TRIGLYCERIDES: TRIGLYCERIDES: 47 mg/dL (ref <150)

## 2015-06-08 LAB — PREALBUMIN: PREALBUMIN: 23.8 mg/dL (ref 18–38)

## 2015-06-08 LAB — VITAMIN B12: VITAMIN B 12: 924 pg/mL — AB (ref 180–914)

## 2015-06-08 LAB — LACTIC ACID, PLASMA: LACTIC ACID, VENOUS: 0.7 mmol/L (ref 0.5–2.0)

## 2015-06-08 MED ORDER — FAT EMULSION 20 % IV EMUL
240.0000 mL | INTRAVENOUS | Status: AC
  Administered 2015-06-08: 20 mL via INTRAVENOUS
  Filled 2015-06-08: qty 250

## 2015-06-08 MED ORDER — SODIUM CHLORIDE 0.9 % IV BOLUS (SEPSIS)
1000.0000 mL | Freq: Once | INTRAVENOUS | Status: AC
  Administered 2015-06-08: 1000 mL via INTRAVENOUS

## 2015-06-08 MED ORDER — SODIUM BICARBONATE 8.4 % IV SOLN
50.0000 meq | Freq: Once | INTRAVENOUS | Status: AC
  Administered 2015-06-08: 50 meq via INTRAVENOUS
  Filled 2015-06-08: qty 50

## 2015-06-08 MED ORDER — MAGNESIUM SULFATE IN D5W 10-5 MG/ML-% IV SOLN
1.0000 g | Freq: Once | INTRAVENOUS | Status: AC
  Administered 2015-06-08: 1 g via INTRAVENOUS
  Filled 2015-06-08: qty 100

## 2015-06-08 MED ORDER — OCTREOTIDE ACETATE 100 MCG/ML IJ SOLN
100.0000 ug | Freq: Two times a day (BID) | INTRAMUSCULAR | Status: DC
  Administered 2015-06-08 – 2015-06-16 (×17): 100 ug via SUBCUTANEOUS
  Filled 2015-06-08 (×22): qty 1

## 2015-06-08 MED ORDER — TRACE MINERALS CR-CU-MN-SE-ZN 10-1000-500-60 MCG/ML IV SOLN
INTRAVENOUS | Status: AC
  Administered 2015-06-08: 18:00:00 via INTRAVENOUS
  Filled 2015-06-08: qty 1800

## 2015-06-08 MED ORDER — SODIUM CHLORIDE 0.9 % IV SOLN
INTRAVENOUS | Status: AC
  Administered 2015-06-08 – 2015-06-09 (×2): via INTRAVENOUS

## 2015-06-08 MED ORDER — SODIUM BICARBONATE 8.4 % IV SOLN
INTRAVENOUS | Status: DC
  Administered 2015-06-08 – 2015-06-09 (×2): via INTRAVENOUS
  Filled 2015-06-08 (×8): qty 150

## 2015-06-08 NOTE — Consult Note (Signed)
[facility] CONSULT NOTE    Date: 06/08/2015                  Patient Name:  Marissa [name]  MRN: [id number]  DOB: 03/25/1941  Age / Sex: 74 y.o., female         PCP: [name], MD                 Service Requesting Consult: [facility]                 Reason for Consult: AKI on CKD Stage III            History of Present Illness: Marissa [name] is a 74 y.o. woman with a PMHx of HTN, type 2 DM, CAD, hyperlipidemia, CKD Stage III, s/p left hemicolectomy with left transverse colon to sigmoid colon anastomosis for diverticulitis, and recent admission in August for abdominal wound exploration of enterocutaneous fistula and abscess who was admitted to [facility] on 06/04/2015 for evaluation of altered mental status and abdominal pain. Her encephalopathy was thought to be secondary to dehydration as she improved with IVFs. Her abdominal pain noted to be around her ostomy site and with increased output. This is being managed by wound care and general surgery.   Patient noted to have an AKI on admission with Cr 2.20 thought to be secondary to volume depletion. Her baseline Cr is 1.2-1.4. Cr improved with IVF to 2.03 and remained stable in the 2.0 range (1.97>2.01) before bumping up to 2.50 today. UA on admission only notable for 1+ protein. She was receiving D5 NS since admission and received an additional 1 L bolus NS today. She also has a metabolic acidosis with bicarb 8-9, currently on sodium bicarb drip at 75 ml/hr. She is currently getting her nutrition through TPN. No recent contrast exposure. Not on any offending medications. Last renal US in April 2016 with right kidney 11.4 cm and left kidney 10.4 cm, evidence of medical renal disease and small right renal cyst.   History obtained from daughter as patient difficult to understand and not answering questions. Daughter reports patient is at her baseline mental status. Daughter states the patient was complaining of dysuria a few days  prior to admission. However, admission UA and urine cx are negative for UTI. She reports the patient also had decreased appetite, nausea, and vomiting. Daughter states the patient takes Colchicine at home for her gout but it has been several weeks since she last took this medication. Daughter states the patient has been urinating normally.   Medications: Outpatient medications: Prescriptions prior to admission  Medication Sig Dispense Refill Last Dose  . amLODipine (NORVASC) 10 MG tablet Take 10 mg by mouth daily.   06/03/2015 at Unknown time  . cyanocobalamin 2000 MCG tablet Take 1 tablet (2,000 mcg total) by mouth daily. 90 tablet 3 Past Month at Unknown time  . furosemide (LASIX) 20 MG tablet Take 20 mg by mouth daily as needed for fluid.    06/03/2015 at Unknown time  . methocarbamol (ROBAXIN) 500 MG tablet Take 1 tablet (500 mg total) by mouth every 8 (eight) hours as needed for muscle spasms. 40 tablet 0 06/03/2015 at Unknown time  . metoprolol tartrate (LOPRESSOR) 25 MG tablet Take 1 tablet (25 mg total) by mouth every morning. 30 tablet 1 06/03/2015 at 1000  . oxyCODONE (OXY IR/ROXICODONE) 5 MG immediate release tablet Take 1 tablet (5 mg total) by mouth every 6 (six) hours as needed for  moderate pain. 30 tablet 0 06/03/2015 at Unknown time  . pantoprazole (PROTONIX) 40 MG tablet Take 1 tablet (40 mg total) by mouth daily. 30 tablet 2 06/03/2015 at Unknown time  . colchicine 0.6 MG tablet TAKE ONE TABLET BY MOUTH AS NEEDED (Patient not taking: Reported on 06/04/2015) 30 tablet 0   . FeAsp-B12-FA-C-DSS-SuccAc-Zn (FERIVA 21/7) 75-1 MG TABS Take 1 tablet by mouth daily. 28 tablet 11 06/02/2015  . furosemide (LASIX) 20 MG tablet TAKE ONE TABLET BY MOUTH DAILY (Patient not taking: Reported on 06/04/2015) 30 tablet 0   . NITROSTAT 0.4 MG SL tablet DISSOLVE ONE TABLET UNDER THE TONGUE EVERY 5 MINUTES AS NEEDED FOR CHEST PAIN.  DO NOT EXCEED A TOTAL OF 3 DOSES IN 15 MINUTES 100 tablet 0 rescue  . VOLTAREN 1  % GEL Apply 2 g topically 4 (four) times daily as needed (pain).    06/02/2015    Current medications: Current Facility-Administered Medications  Medication Dose Route Frequency Provider Last Rate Last Dose  . acetaminophen (TYLENOL) tablet 650 mg  650 mg Oral Q6H PRN Truett Mainland, DO       Or  . acetaminophen (TYLENOL) suppository 650 mg  650 mg Rectal Q6H PRN Tanna Savoy Stinson, DO      . alum & mag hydroxide-simeth (MAALOX/MYLANTA) 200-200-20 MG/5ML suspension 30 mL  30 mL Oral Q6H PRN Tanna Savoy Stinson, DO      . enoxaparin (LOVENOX) injection 30 mg  30 mg Subcutaneous Q24H Thuy D Dang, RPH   30 mg at 06/05/15 2244  . TPN [facility]) Adult without lytes   Intravenous Cyclic-See Admin Instructions Jaquita Folds, RPH   Stopped at 06/08/15 4268   And  . fat emulsion 20 % infusion 240 mL  240 mL Intravenous Cyclic-See Admin Instructions Jaquita Folds, RPH   Stopped at 06/08/15 0604  . TPN (CLINIMIX-E) Adult   Intravenous Cyclic-See Admin Instructions Tyrone Apple, [facility]       And  . fat emulsion 20 % infusion 240 mL  240 mL Intravenous Cyclic-See Admin Instructions Tyrone Apple, RPH      . methocarbamol (ROBAXIN) tablet 500 mg  500 mg Oral Q8H PRN Tanna Savoy Stinson, DO      . metoprolol (LOPRESSOR) injection 2.5 mg  2.5 mg Intravenous Q6H PRN Florencia Reasons, MD      . morphine 2 MG/ML injection 1 mg  1 mg Intravenous Q3H PRN Florencia Reasons, MD   1 mg at 06/07/15 1601  . nitroGLYCERIN (NITROSTAT) SL tablet 0.4 mg  0.4 mg Sublingual Q5 min PRN Tanna Savoy Stinson, DO      . nystatin (MYCOSTATIN) 100000 UNIT/ML suspension 500,000 Units  5 mL Oral QID Florencia Reasons, MD   500,000 Units at 06/08/15 [phone]  . octreotide (SANDOSTATIN) injection 100 mcg  100 mcg Subcutaneous Q12H Emina Riebock, NP   100 mcg at 06/08/15 1253  . ondansetron (ZOFRAN) tablet 4 mg  4 mg Oral Q6H PRN Tanna Savoy Stinson, DO       Or  . ondansetron [facility]) injection 4 mg  4 mg Intravenous Q6H PRN Tanna Savoy Stinson, DO      . pantoprazole (PROTONIX) injection 40  mg  40 mg Intravenous QHS Florencia Reasons, MD   40 mg at 06/07/15 2126  . sodium bicarbonate 150 mEq in dextrose 5 % 1,000 mL infusion   Intravenous Continuous Florencia Reasons, MD      . sodium bicarbonate injection 50 mEq  50  mEq Intravenous Once Florencia Reasons, MD      . sodium chloride 0.9 % injection 10-40 mL  10-40 mL Intracatheter PRN Tanna Savoy Stinson, DO   10 mL at 06/06/15 0522      Allergies: No Known Allergies    Past Medical History: Past Medical History  Diagnosis Date  . Hypertension   . Hyperlipidemia   . History of DVT (deep vein thrombosis)   . History of colonic polyps   . Gout   . Polyarthritis   . Microcytic anemia   . GERD (gastroesophageal reflux disease)   . Esophagitis   . Hidradenitis suppurativa     s/p axillary sweat gland removal  . H/O blood transfusion reaction     at [facility] hospital  . Diverticulitis   . Difficulty sleeping   . PMB (postmenopausal bleeding)     X 2 YRS  . Bowel trouble     OCCASIONAL BOWEL INCONTINENCE  . Fibroids     s/p TAH/BSO 07/2012  . CAD (coronary artery disease)     a. 8/07 had BMS to OM.  b. In-stent restenosis with later Promus DES to same site.  c. Lexiscan myoview in 1/13 showed EF 67%, no ischemia or infarction.  d. Echo (2/13) showed EF 55-60%, moderate LVH, mild MR.  e. Lex MV 6/14: no isch, EF 53%;  f. Echo 6/14: mild LVH, EF 55-60%, Gr 1 DD, MAC, mild MR, mild LAE  . Peripheral neuropathy   . Chronic diastolic CHF (congestive heart failure) 06/29/2014  . Renal insufficiency     baseline Cr ~ 1.3  . Chronic kidney disease (CKD), stage III (moderate)     Archie Endo 04/27/2015  . OSA on CPAP     "wear it sometimes" (04/27/2015)  . History of hiatal hernia   . DPOEUMPN(361.4)     "weekly" (04/27/2015)  . Enterocutaneous fistula     "have had it 2-3 months now" 04/27/2015  . Type II diabetes mellitus     diet controlled: pt's daughter denies mother's hx of diabetes     Past Surgical History: Past Surgical History  Procedure Laterality  Date  . Anterior cervical decomp/discectomy fusion    . Cervical laminectomy    . Umbilical hernia repair    . Axillary hidradenitis excision Bilateral   . Knee arthroscopy Right   . Colectomy Left 2004    left partial for perforation  . Cholecystectomy    . Tubal ligation    . Coronary angioplasty with stent placement      of mid and distal circumflex coronary artery. 1st stent was 04/2006, with drug eluting stent placed on 12/20/07 (30-50% RCA stenosis). Cardiologist Dr. Einar Gip.  . Dilation and curettage of uterus  02/2011  . Hysteroscopy w/d&c  05/04/2012    Procedure: DILATATION AND CURETTAGE /HYSTEROSCOPY;  Surgeon: Lahoma Crocker, MD;  Location: [facility];  Service: Gynecology;  Laterality: N/A;  . Robotic assisted total hysterectomy with bilateral salpingo oopherectomy  07/24/2012    Procedure: ROBOTIC ASSISTED TOTAL HYSTERECTOMY WITH BILATERAL SALPINGO OOPHORECTOMY;  Surgeon: Janie Morning, MD PHD;  Location: [facility];  Service: Gynecology;  Laterality: Bilateral;  attempted ,  converted to abdomnial hysterectomy  . Abdominal hysterectomy  07/24/2012    Procedure: HYSTERECTOMY ABDOMINAL;  Surgeon: Janie Morning, MD PHD;  Location: [facility];  Service: Gynecology;;  . Salpingoophorectomy  07/24/2012    Procedure: SALPINGO OOPHORECTOMY;  Surgeon: Janie Morning, MD PHD;  Location: [facility];  Service: Gynecology;  Laterality: Bilateral;  .  Abdominal wound dehiscence  08/01/2012    Procedure: ABDOMINAL WOUND DEHISCENCE;  Surgeon: Lahoma Crocker, MD;  Location: [facility];  Service: Gynecology;  Laterality: N/A;  . Laparotomy  08/02/2012    Procedure: EXPLORATORY LAPAROTOMY;  Surgeon: Imogene Burn. Georgette Dover, MD;  Location: [facility];  Service: General;  Laterality: N/A;  placement of abdominal wound vac  . Colonoscopy N/A 12/22/2014    Procedure: COLONOSCOPY;  Surgeon: Juanita Craver, MD;  Location: [facility];  Service: Endoscopy;  Laterality: N/A;  . Esophagogastroduodenoscopy Left 01/13/2015     Procedure: ESOPHAGOGASTRODUODENOSCOPY (EGD);  Surgeon: Carol Ada, MD;  Location: [facility];  Service: Endoscopy;  Laterality: Left;  . Back surgery    . Hernia repair    . Laparotomy N/A 04/28/2015    Procedure: Abdominal Wound Explororation, Explantation of Abdominal Mesh;  Surgeon: Donnie Mesa, MD;  Location: [facility];  Service: General;  Laterality: N/A;  . Cardiac surgery       Family History: Family History  Problem Relation Age of Onset  . Diabetes Mother   . Coronary artery disease Mother   . Diabetes Sister   . Coronary artery disease Sister      Social History: Social History   Social History  . Marital Status: Widowed    Spouse Name: N/A  . Number of Children: 5  . Years of Education: 12   Occupational History  . Retired    Social History Main Topics  . Smoking status: Never Smoker   . Smokeless tobacco: Never Used  . Alcohol Use: No  . Drug Use: No  . Sexual Activity: No   Other Topics Concern  . Not on file   Social History Narrative   Widowed. Lives with daughter.   5 living children, 1 deceased.   Retired, used to work in Public relations account executive.   12th grade high school education.   Has Medicaid.           Review of Systems: As per HPI  Vital Signs: Blood pressure 84/52, pulse 103, temperature 99.1 F (37.3 C), temperature source Axillary, resp. rate 18, height 5\' 4"  (1.626 m), weight 225 lb (102.059 kg), SpO2 100 %.  Weight trends: Filed Weights   06/04/15 1003  Weight: 225 lb (102.059 kg)    Physical Exam: General: elderly woman sitting up in chair, somnolent but easily arousable, does not answer questions HEENT: [place]/AT, EOMI, sclera anicteric, mucus membranes moist CV: RRR, no m/g/r Pulm: diminished breath sounds. No crackles appreciated. Breaths non-labored. Abd: BS+, Eakin pouch in mid-abdomen, mildly tender to palpation Ext: warm, no peripheral edema Neuro: alert, sitting up in chair   Lab results: Basic Metabolic  Panel:  Recent Labs Lab 06/05/15 0500 06/06/15 0522 06/07/15 0614 06/08/15 0553  NA 141 144 144 140  K 5.0 4.7 4.7 4.0  CL 128* 129* 130* 125*  CO2 9* 9* 9* 8*  GLUCOSE 114* 111* 116* 151*  BUN 105* 98* 107* 122*  CREATININE 2.03* 1.97* 2.01* 2.50*  CALCIUM 9.6 9.5 9.4 9.0  MG 2.7* 2.0  --  1.6*  PHOS 6.2* 3.6 3.2 2.8    Liver Function Tests:  Recent Labs Lab 06/04/15 1050 06/05/15 0500 06/08/15 0553  AST 54* 35 29  ALT 108* 84* 58*  ALKPHOS 114 107 104  BILITOT 0.2* 0.5 0.3  PROT 7.5 6.4* 6.3*  ALBUMIN 2.9* 2.5* 2.2*   No results for input(s): LIPASE, AMYLASE in the last 168 hours. No results for input(s): AMMONIA in the last 168  hours.  CBC:  Recent Labs Lab 06/04/15 1050 06/04/15 1113 06/05/15 0500 06/08/15 0553  WBC 11.9*  --  8.1 10.2  NEUTROABS 9.2*  --  5.9 8.2*  HGB 8.9* 9.5* 7.8* 7.2*  HCT 27.6* 28.0* 24.2* 22.9*  MCV 71.1*  --  70.3* 71.1*  PLT 271  --  239 189    Cardiac Enzymes: No results for input(s): CKTOTAL, CKMB, CKMBINDEX, TROPONINI in the last 168 hours.  BNP: Invalid input(s): POCBNP  CBG:  Recent Labs Lab 06/07/15 2139 06/08/15 0010 06/08/15 0417 06/08/15 0731 06/08/15 1124  GLUCAP 108* 131* 162* 78 95    Microbiology: Results for orders placed or performed during the hospital encounter of 06/04/15  Urine culture     Status: None   Collection Time: 06/04/15 10:39 AM  Result Value Ref Range Status   Specimen Description URINE, CATHETERIZED  Final   Special Requests NONE  Final   Culture NO GROWTH 1 DAY  Final   Report Status 06/05/2015 FINAL  Final  Blood Culture (routine x 2)     Status: None (Preliminary result)   Collection Time: 06/04/15 10:50 AM  Result Value Ref Range Status   Specimen Description BLOOD PICC LINE  Final   Special Requests BOTTLES DRAWN AEROBIC AND ANAEROBIC 5CC  Final   Culture NO GROWTH 4 DAYS  Final   Report Status PENDING  Incomplete  Blood Culture (routine x 2)     Status: None  (Preliminary result)   Collection Time: 06/04/15 11:00 AM  Result Value Ref Range Status   Specimen Description BLOOD RIGHT HAND  Final   Special Requests BOTTLES DRAWN AEROBIC AND ANAEROBIC 5CC  Final   Culture NO GROWTH 4 DAYS  Final   Report Status PENDING  Incomplete    Coagulation Studies: No results for input(s): LABPROT, INR in the last 72 hours.  Urinalysis: No results for input(s): COLORURINE, LABSPEC, PHURINE, GLUCOSEU, HGBUR, BILIRUBINUR, KETONESUR, PROTEINUR, UROBILINOGEN, NITRITE, LEUKOCYTESUR in the last 72 hours.  Invalid input(s): APPERANCEUR    Imaging:  No results found.   Assessment & Plan: Ms. Banning is a 74 y.o. woman with a PMHx of HTN, type 2 DM, CAD, hyperlipidemia, CKD Stage III, s/p left hemicolectomy with left transverse colon to sigmoid colon anastomosis for diverticulitis, and recent admission in August for abdominal wound exploration of enterocutaneous fistula and abscess who was admitted for evaluation of altered mental status and abdominal pain likely secondary to volume depletion and her chronic enterocutaneous fistula.   1. AKI on CKD Stage III: Cr 2.2 on admission and then trended down to 2.0 after IVFs. Now up to 2.5 today. Baseline Cr 1.2-1.4. Her AKI is likely due to volume depletion with ischemic ATN component. BUN is significantly elevated at 122. Will start NS at 100 ml/hr for next 18 hours and observe Cr in AM.   2. Acute Encephalopathy: Likely related to her AKI. However, daughter notes patient is at her baseline mental status. Patient does appear frail and deconditioned so difficult to assess. Will continue to monitor. Hopefully she will perk up with some fluids.   3. 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 Currently being managed by wound care and general surgery.   4. Metabolic Acidosis: Bicarb 6 on admission. Previously 22 on 9/8. Bicarb has remained low in 8-9 range. Started on sodium bicarb drip on 9/25 at 50 ml/hr.  Patient is very acidotic. Will check lactic acid and increase sodium bicarb drip to 125 ml/hr.  5. Microcytic Anemia: Hgb 8.9 on admission, currently 7.2. MCV 70-71. Likely secondary to anemia of chronic disease. Iron studies ordered for tomorrow. Transfuse for Hgb < 7.0.   6. Hypomagnesemia: Mg 1.6 today. She received magnesium sulfate 1 g. Continue to monitor.   DVT PPX : Lovenox SQ   Thank you for this interesting consult. Attending note to follow.   Albin Felling, MD, MPH Internal Medicine Resident, PGY-II Pager: [phone]  Reviewed the note and discussed with Dr Arcelia Jew. The patient will continue on IV hydration with IV bicarbonate

## 2015-06-08 NOTE — Clinical Social Work Placement (Addendum)
   CLINICAL SOCIAL WORK PLACEMENT  NOTE  Date:  06/08/2015  Patient Details  Name: Marissa [name] MRN: [id number] Date of Birth: [birth year]  Clinical Social Work is seeking post-discharge placement for this patient at the [place] level of care (*CSW will initial, date and re-position this form in  chart as items are completed):  Yes   Patient/family provided with [place] Work Department's list of facilities offering this level of care within the geographic area requested by the patient (or if unable, by the patient's family).  Yes   Patient/family informed of their freedom to choose among providers that offer the needed level of care, that participate in Medicare, Medicaid or managed care program needed by the patient, have an available bed and are willing to accept the patient.  Yes   Patient/family informed of [facility]'s ownership interest in [facility] and [facility], as well as of the fact that they are under no obligation to receive care at these facilities.  PASRR submitted to EDS on       PASRR number received on       Existing PASRR number confirmed on 06/08/15     FL2 transmitted to all facilities in geographic area requested by pt/family on  06/08/15     FL2 transmitted to all facilities within larger geographic area on       Patient informed that his/her managed care company has contracts with or will negotiate with certain facilities, including the following:   [facility])      06/08/15 - Patient/family informed of bed offers received.  Patient chooses bed at  [facility]     Physician recommends and patient chooses bed at      Patient to be transferred to   on  .  Patient to be transferred to facility by       Patient family notified on   of transfer.  Name of family member notified:        PHYSICIAN Please prepare priority discharge summary, including medications, Please sign FL2, Please  prepare prescriptions     Additional Comment:  06/08/15 - Daughter at the bedside to receive bed offers. Wants CSW to check on SNF in [place], [place] (daughter thinks is the name,   _______________________________________________ Sable Feil, LCSW 06/08/2015, 1:19 PM

## 2015-06-08 NOTE — Consult Note (Addendum)
WOC wound follow up Pouch was changed once this weekend, according to patient's daughter at the bedside. Current fistula pouch is leaking onto denuded lower abd skin. It will be difficult to maintain the seal for more than 24 hours until denuded weeping areas have resolved. Cleansed all the skin and cut a small Eakin pouch to fit the perimeter of the wound, she has one fistula that is actively draining green effluent at 12 o'clock.Wound is full thickness;  100% beefy red with mod amt green drainage, no odor. Lower abd with partial thickness skin loss, red and moist with small amt in patchy areas but improved since Friday without further bleeding. Crusted with ostomy powder and skin prep to protect skin, very painful process for the patient. There are several creases which occur when patient leans forward; difficult pouching situation. Built up the midline wound just distal to the edge of the perimeter of the Eakin pouch with several strips of eakin barrier to create a dam and flatten this area. Placed pouch to LWS with suction catheter to allow for the pouch to stay continuously drained and allow the skin some time to heal while she is in the hospital. Mod amt green drainage in cannister. We do this routinely when the skin has this much damage or the output can not be managed effectively with the pouch alone. If the patient is d/c to home will need to just DC the suction and cap off the Eakin for manual drainage. Foam dressing applied to lower abd to absorb drainage and promote healing. Supplies at bedside and instructions provided for bedside nurses. Julien Girt MSN, RN, [place], [place], [place]

## 2015-06-08 NOTE — Progress Notes (Signed)
PROGRESS NOTE  Marissa [name] CLE:[id number] DOB: Dec 29, 1940 DOA: 06/04/2015 PCP: [name], MD  HPI/Recap of past 24 hours:  Very weak and frail female, open eyes, whispers, attempt to follow command. Per RN, patient has been refusing meds, patient is more awake when family is around  Assessment/Plan: Active Problems:   Dehydration   Encephalopathy   Abdominal pain   Acute renal failure  Confusion/encephalopathy:  Fluctuation in alertness, per RN patient is more alert when family is around. CT head no acute findings. 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 Nephrology consulted due to worsening of renal function.   Metabolic acidosis: start bicarb/d5 from 9/25, persistent acidosis, push 1amp of bicarb on 9/26, adjust bicarb ivf to 3amp in d5 running at 75cc/her.  Dehydration in the setting of draining wound and strict npo, total fluids requirement per day at least 35cc/kg, currently she is getting tpn total volume of 1.8liter. On ivf with bicarb/d5, intermittent ns fluids bolus.  H/o HTN: on lopressor QD at home, patient bp low normal, d/ced scheduled lopressor on 9/24, change to prn iv lopressor for sbp >160 or heart rate >120. Will monitor.  H/o chronic anemia, will close monitor, no need of transfusion yet. (h/o transfusion reaction per chart, but daughter denies), iron study pending.  H/o NIDDM2: last A1c 5.7 in 03/2015, on ssi here  H/o gout: stable, colchicine held due to ARF, consider uloric at discharge  Chronic enterocutaneuos fistula: s/p Eakin pouch, difficult seal , currently on LWS, npo and TPN. management per woc and general surgery. sandostatin started on 9/26 per surgery  FTT: prognosis guarded,  need snf.  Code Status: full, confirmed with daughter  Family Communication: patient , son and daughter in law, discussed with family member about poor  prognosis.  Disposition Plan: SNF   Consultants:  General surgery  WOC  nephrology  Procedures:  Chronic TPN  Antibiotics:  none   Objective: BP 84/52 mmHg  Pulse 103  Temp(Src) 99.1 F (37.3 C) (Axillary)  Resp 18  Ht 5\' 4"  (1.626 m)  Wt 225 lb (102.059 kg)  BMI 38.60 kg/m2  SpO2 100%  Intake/Output Summary (Last 24 hours) at 06/08/15 1330 Last data filed at 06/08/15 0557  Gross per 24 hour  Intake 2391.49 ml  Output    300 ml  Net 2091.49 ml   Filed Weights   06/04/15 1003  Weight: 225 lb (102.059 kg)    Exam:   General: very frail, open eyes but minimal responsive, maintain airway  Cardiovascular: RRR  Respiratory: diminished, but no rales, no rhonchi, no wheezing  Abdomen: Eakin pouch conected to LWS, positive BS  Musculoskeletal: No Edema  Neuro: very lethargic, not talking , but open eyes  Data Reviewed: Basic Metabolic Panel:  Recent Labs Lab 06/04/15 1050 06/04/15 1113 06/05/15 0500 06/06/15 0522 06/07/15 0614 06/08/15 0553  NA 138 141 141 144 144 140  K 5.8* 5.7* 5.0 4.7 4.7 4.0  CL 120* 123* 128* 129* 130* 125*  CO2 6*  --  9* 9* 9* 8*  GLUCOSE 111* 112* 114* 111* 116* 151*  BUN 130* 133* 105* 98* 107* 122*  CREATININE 2.13* 2.20* 2.03* 1.97* 2.01* 2.50*  CALCIUM 10.4*  --  9.6 9.5 9.4 9.0  MG  --   --  2.7* 2.0  --  1.6*  PHOS  --   --  6.2* 3.6 3.2  2.8   Liver Function Tests:  Recent Labs Lab 06/04/15 1050 06/05/15 0500 06/08/15 0553  AST 54* 35 29  ALT 108* 84* 58*  ALKPHOS 114 107 104  BILITOT 0.2* 0.5 0.3  PROT 7.5 6.4* 6.3*  ALBUMIN 2.9* 2.5* 2.2*   No results for input(s): LIPASE, AMYLASE in the last 168 hours. No results for input(s): AMMONIA in the last 168 hours. CBC:  Recent Labs Lab 06/04/15 1050 06/04/15 1113 06/05/15 0500 06/08/15 0553  WBC 11.9*  --  8.1 10.2  NEUTROABS 9.2*  --  5.9 8.2*  HGB 8.9* 9.5* 7.8* 7.2*  HCT 27.6* 28.0* 24.2* 22.9*  MCV 71.1*  --  70.3* 71.1*  PLT 271  --   239 189   Cardiac Enzymes:   No results for input(s): CKTOTAL, CKMB, CKMBINDEX, TROPONINI in the last 168 hours. BNP (last 3 results)  Recent Labs  11/02/14 2340 03/19/15 1325  BNP 109.2* 141.6*    ProBNP (last 3 results) No results for input(s): PROBNP in the last 8760 hours.  CBG:  Recent Labs Lab 06/07/15 2139 06/08/15 0010 06/08/15 0417 06/08/15 0731 06/08/15 1124  GLUCAP 108* 131* 162* 68 95    Recent Results (from the past 240 hour(s))  Urine culture     Status: None   Collection Time: 06/04/15 10:39 AM  Result Value Ref Range Status   Specimen Description URINE, CATHETERIZED  Final   Special Requests NONE  Final   Culture NO GROWTH 1 DAY  Final   Report Status 06/05/2015 FINAL  Final  Blood Culture (routine x 2)     Status: None (Preliminary result)   Collection Time: 06/04/15 10:50 AM  Result Value Ref Range Status   Specimen Description BLOOD PICC LINE  Final   Special Requests BOTTLES DRAWN AEROBIC AND ANAEROBIC 5CC  Final   Culture NO GROWTH 4 DAYS  Final   Report Status PENDING  Incomplete  Blood Culture (routine x 2)     Status: None (Preliminary result)   Collection Time: 06/04/15 11:00 AM  Result Value Ref Range Status   Specimen Description BLOOD RIGHT HAND  Final   Special Requests BOTTLES DRAWN AEROBIC AND ANAEROBIC 5CC  Final   Culture NO GROWTH 4 DAYS  Final   Report Status PENDING  Incomplete     Studies: No results found.  Scheduled Meds: . enoxaparin (LOVENOX) injection  30 mg Subcutaneous Q24H  . nystatin  5 mL Oral QID  . octreotide  100 mcg Subcutaneous Q12H  . pantoprazole (PROTONIX) IV  40 mg Intravenous QHS  . sodium bicarbonate  50 mEq Intravenous Once    Continuous Infusions: . TPN (CLINIMIX) Adult without lytes Stopped (06/08/15 0604)   And  . fat emulsion Stopped (06/08/15 0604)  . Marland KitchenTPN (CLINIMIX-E) Adult     And  . fat emulsion    .  sodium bicarbonate  infusion 1000 mL       Time spent: 25mins  Xu,Fang MD,  PhD  [facility] Pager [phone]. If 7PM-7AM, please contact night-coverage at [email or web address], password [facility] 06/08/2015, 1:30 PM  LOS: 4 days

## 2015-06-08 NOTE — Clinical Social Work Note (Signed)
Clinical Social Work Assessment  Patient Details  Name: Marissa [name] MRN: [id number] Date of Birth: 12/20/1940  Date of referral:  06/07/15               Reason for consult:  Facility Placement                Permission sought to share information with:  Family Supports, Chartered certified accountant granted to share information::  Yes, Verbal Permission Granted  Name::     Family- Especially daughter Marissa [name] SNFs    Housing/Transportation Living arrangements for the past 2 months:  [place] of Information:  Patient, Adult Children Patient Interpreter Needed:  None Criminal Activity/Legal Involvement Pertinent to Current Situation/Hospitalization:  No - Comment as needed Significant Relationships:  Adult Children Lives with:  Adult Children Do you feel safe going back to the place where you live?  Yes Need for family participation in patient care:  Yes (Comment)  Care giving concerns: Daughter is primary caregiver for medically complex patient. Condition has deteriorated.  Social Worker assessment / plan:  CSW met with patient and family today to discuss current issues and d/c needs.  PT is recommending SNF placement.  Patient has home TPN, a relatively new colostomy and she has been less responsive with this admission.  CSW attempted to engage patient with little success. She will make eye contact but will only whisper short words.  She has been living with her daughter Marissa [name] who has been her primary caregiver; she has been trying to provide care for her mother despite her multiple medical complexity. She states that she has been overwhelmed at times but draws on her strong faith to "help me get through."  CSW spent a long time with patient's daughter providing emotional support today. She was very tearful and stated that MD has indicated that patient's condition continues to deteriorate. Daughter feels overwhelmed with this as she had hopes  for her mother's medical improvement so that they could travel and do some enjoyable activities.  "She is my best friend".  Daughter indicated that while she has other siblings- the care giving responsibilities have fallen to her.  Discussed PT's recommendation for SNF; while very upset- she agrees that this is best choice for her mother at this time. Fl2 will be initiated and placed on chart for MD's signature.  Patient may be a difficult to place patient due to TPN and new colostomy.  She also will require [facility] which will require pre-authorization.  Daughter verbalized preferences for placement as follows:  [place], [place], [place].  She verbalizes understanding of barriers to placement.  Employment status:  Retired Nurse, adult PT Recommendations:  [place] / Referral to community resources:  [place]  Patient/Family's Response to care:  Daughter very upset that she has been given report that her mother's condition is deteriorating and that end of life services will need to be addressed in the future.  Patient/Family's Understanding of and Emotional Response to Diagnosis, Current Treatment, and Prognosis:  Daughter noted to be tearful, frightened and verbalizes feelings of being overwhelmed with current medical issues for her mother. She is grieving for the plans that she and her mother had made to travel and to "enjoy life". She has a strong understanding of her mother's current diagnosis, treatment and prognosis.    Emotional Assessment Appearance:  Appears stated age Attitude/Demeanor/Rapport:   (  Quiet, poorly responsive and not engaged) Affect (typically observed):  Quiet (Very difficult to assess as patient is not very responsive) Orientation:  Oriented to Self Alcohol / Substance use:  Never Used Psych involvement (Current and /or in the community):  No  (Comment)  Discharge Needs  Concerns to be addressed:  Care Coordination Readmission within the last 30 days:  Yes Current discharge risk:  Chronically ill, Dependent with Mobility Barriers to Discharge:  Continued Medical Work up   Estill Bakes 06/07/2015   4:35 PM

## 2015-06-08 NOTE — Clinical Social Work Placement (Signed)
   CLINICAL SOCIAL WORK PLACEMENT  NOTE  Date:  06/08/2015  Patient Details  Name: Marissa [name] MRN: [id number] Date of Birth: 08/15/1941  Clinical Social Work is seeking post-discharge placement for this patient at the [place] level of care (*CSW will initial, date and re-position this form in  chart as items are completed):  Yes   Patient/family provided with [place] Work Department's list of facilities offering this level of care within the geographic area requested by the patient (or if unable, by the patient's family).  Yes   Patient/family informed of their freedom to choose among providers that offer the needed level of care, that participate in Medicare, Medicaid or managed care program needed by the patient, have an available bed and are willing to accept the patient.  Yes   Patient/family informed of [facility]'s ownership interest in [facility] and [facility], as well as of the fact that they are under no obligation to receive care at these facilities.  PASRR submitted to EDS on       PASRR number received on       Existing PASRR number confirmed on 06/08/15     FL2 transmitted to all facilities in geographic area requested by pt/family on       FL2 transmitted to all facilities within larger geographic area on       Patient informed that his/her managed care company has contracts with or will negotiate with certain facilities, including the following:   [facility])         Patient/family informed of bed offers received.  Patient chooses bed at       Physician recommends and patient chooses bed at      Patient to be transferred to   on  .  Patient to be transferred to facility by       Patient family notified on   of transfer.  Name of family member notified:        PHYSICIAN Please prepare priority discharge summary, including medications, Please sign FL2, Please prepare prescriptions     Additional  Comment:    _______________________________________________ Williemae Area, LCSW 06/08/2015, 8:28 AM

## 2015-06-08 NOTE — Care Management Important Message (Signed)
Important Message  Patient Details  Name: Marissa [name] MRN: [id number] Date of Birth: September 18, 1940   Medicare Important Message Given:  Yes-second notification given    Delorse Lek 06/08/2015, 3:32 PM

## 2015-06-08 NOTE — Clinical Social Work Placement (Deleted)
   CLINICAL SOCIAL WORK PLACEMENT  NOTE  Date:  06/08/2015  Patient Details  Name: Marissa [name] MRN: [id number] Date of Birth: Mar 23, 1941  Clinical Social Work is seeking post-discharge placement for this patient at the [place] level of care (*CSW will initial, date and re-position this form in  chart as items are completed):  Yes   Patient/family provided with [place] Work Department's list of facilities offering this level of care within the geographic area requested by the patient (or if unable, by the patient's family).  Yes   Patient/family informed of their freedom to choose among providers that offer the needed level of care, that participate in Medicare, Medicaid or managed care program needed by the patient, have an available bed and are willing to accept the patient.  Yes   Patient/family informed of [place]'s ownership interest in [facility] and [facility], as well as of the fact that they are under no obligation to receive care at these facilities.  PASRR submitted to EDS on 06/08/15     PASRR number received on 06/08/15     Existing PASRR number confirmed on       FL2 transmitted to all facilities in geographic area requested by pt/family on       FL2 transmitted to all facilities within larger geographic area on       Patient informed that his/her managed care company has contracts with or will negotiate with certain facilities, including the following:   [facility])         Patient/family informed of bed offers received.  Patient chooses bed at       Physician recommends and patient chooses bed at      Patient to be transferred to   on  .  Patient to be transferred to facility by       Patient family notified on   of transfer.  Name of family member notified:        PHYSICIAN Please prepare priority discharge summary, including medications, Please sign FL2, Please prepare prescriptions      Additional Comment:    _______________________________________________ Williemae Area, LCSW 06/08/2015, 8:19 AM

## 2015-06-08 NOTE — Progress Notes (Signed)
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  No Known Allergies  Patient Measurements: Height: 5\' 4"  (162.6 cm) Weight: 225 lb (102.059 kg) IBW/kg (Calculated) : 54.7  Vital Signs: Temp: 98.6 F (37 C) (09/26 0422) Temp Source: Oral (09/26 0422) BP: 111/40 mmHg (09/26 0422) Pulse Rate: 104 (09/26 0422) Intake/Output from previous day: 09/25 0701 - 09/26 0700 In: 2391.5 [I.V.:723.3; EHM:0947.0] Out: 400 [Drains:400]  Labs:  Recent Labs  06/08/15 0553  WBC 10.2  HGB 7.2*  HCT 22.9*  PLT 189     Recent Labs  06/06/15 0522 06/07/15 0614 06/08/15 0553  NA 144 144 140  K 4.7 4.7 4.0  CL 129* 130* 125*  CO2 9* 9* 8*  GLUCOSE 111* 116* 151*  BUN 98* 107* 122*  CREATININE 1.97* 2.01* 2.50*  CALCIUM 9.5 9.4 9.0  MG 2.0  --  1.6*  PHOS 3.6 3.2 2.8  PROT  --   --  6.3*  ALBUMIN  --   --  2.2*  AST  --   --  29  ALT  --   --  58*  ALKPHOS  --   --  104  BILITOT  --   --  0.3  PREALBUMIN  --   --  23.8  TRIG  --   --  47   Estimated Creatinine Clearance: 23 mL/min (by C-G formula based on Cr of 2.5).    Recent Labs  06/08/15 0010 06/08/15 0417 06/08/15 0731  GLUCAP 131* 162* 68       Insulin Requirements in the past 24 hours:  None (no insulin in home TPN)  Assessment: Marissa [name] with an extensive surgical history to continue on TPN for EC fistula. CT on admit shows no acute abnormality.  Admit: AMS, abd pain Surgeries/Procedures: hx left hemicolectomy with left transverse colon to sigmoid colon anastomosis for diverticulitis, umbilical hernia repair, repair of incarcerated ventral incisional hernia in 2009, open abd hysterectomy in 2013, abdominal wound exploration of EC fistula and abscess in 04/28/2015  GI: GERD / esophagitis / diverticulitis / occasional bowel incontinence / high output EC fistula. Fistula with bilious output. NPO to allow fistula to heal; prealbumin WNL at  23.8.  Eakin's pouch to suction with 442mL out, greenish yellow output. Considering Sandostatin Endo: gout (colchicine held d/t AKI, MD considering Uloric). DM2 - no insulin in home TPN. CBGs 74-162 on TPN and 60-70s while off TPN. Lytes: none in TPN since admit 9/23 - low CO2 and elevated CL, Mag low at 1.6, CoCa high normal at 10.44 (Ca x Phos = 29, goal < 55) Renal: CKD3 - SCr up 2.5 (baseline 1.23, early Sept was 1.45), no UOP Pulm: OSA on CPAP - stable on RA Cards: HTN / HLD / CAD / CHF (EF 55-60%) - BP improving, some tachycardia Hepatobil: AST/ALT improving, tbili WNL.  TG WNL. ID: afebrile, WBC WNL - not on abx Best Practices: Lovenox TPN Access: PICC line placed 04/27/15 TPN start date: 9/23 >>  Current Nutrition:  Cyclic TPN at home (last 9/62)  Home TPN: Cyclic compounded 3:1 TPN with electrolytes over 12 hours at home = 1900 kCal and 90g protein per day  Nutrition Goals: 1700-1900 kCal and 100-110 gm protein per day   Plan:  - Clinimix E 5/15 (with lytes), infuse 1867mL over 12 hrs:  50 ml/hr x 1 hr, then 170 ml/hr x 10 hrs, then 50 ml/hr x 1 hr -  IVFE at 20 ml/hr x 12 hrs - TPN provides 1800 kCal and 90 gm protein daily, meeting 100% of kCal and 90% of minimal protein needs.  Goal protein for CRF is ~0.8 g/kg/d, currently providing 0.88 g/kg/d. - Daily multivitamin and trace elements - Bicarb gtt at 50 ml/hr for 24 hours total per MD.  IVF for AKI per MD. - Mag 1gm IV x 1  - D/C SSI, continue CBG checks - F/U AM labs    Thuy D. Mina Marble, PharmD, BCPS Pager:  [phone] 06/08/2015, 8:41 AM

## 2015-06-08 NOTE — Progress Notes (Signed)
Patient ID: Marissa [name], female   DOB: [birth year], 74 y.o.   MRN: [id number]     [facility]      [address] [place], [place] [address]    Phone: [phone] FAX: [phone]     Subjective: Output up to 424ml/24h.  Flat affect.  Daughter at bedside.  Answered questions. WOC at bedside.    Objective:  Vital signs:  Filed Vitals:   06/07/15 1551 06/07/15 1809 06/07/15 2127 06/08/15 0422  BP: 142/113 92/49 100/57 111/40  Pulse: 81 61 68 104  Temp: 98.6 F (37 C)  98.5 F (36.9 C) 98.6 F (37 C)  TempSrc: Axillary  Oral Oral  Resp: $Remo'18 16 18 19  'YGVLp$ Height:      Weight:      SpO2: 97%  98% 100%    Last BM Date:  (PTA )  Intake/Output   Yesterday:  09/25 0701 - 09/26 0700 In: 2391.5 [I.V.:723.3; UYQ:0347.4] Out: 400 [Drains:400] This shift: I/O last 3 completed shifts: In: 4305.7 [I.V.:745] Out: 650 [Drains:650]   Physical Exam: General: Pt awake/alert/oriented x4 in no acute distress  Abdomen: Soft.  Nondistended. Tender over wound which is clean and superficial, bilious output.  Surrounding skin is less excoriated.  No evidence of peritonitis.  No incarcerated hernias.   Problem List:   Active Problems:   Dehydration   Encephalopathy   Abdominal pain   Acute renal failure    Results:   Labs: Results for orders placed or performed during the hospital encounter of 06/04/15 (from the past 48 hour(s))  Glucose, capillary     Status: Abnormal   Collection Time: 06/06/15 11:35 AM  Result Value Ref Range   Glucose-Capillary 116 (H) 65 - 99 mg/dL  Glucose, capillary     Status: None   Collection Time: 06/06/15  4:57 PM  Result Value Ref Range   Glucose-Capillary 93 65 - 99 mg/dL  Glucose, capillary     Status: Abnormal   Collection Time: 06/06/15  9:12 PM  Result Value Ref Range   Glucose-Capillary 138 (H) 65 - 99 mg/dL  Basic metabolic panel     Status: Abnormal   Collection Time: 06/07/15  6:14 AM   Result Value Ref Range   Sodium 144 135 - 145 mmol/L   Potassium 4.7 3.5 - 5.1 mmol/L   Chloride 130 (H) 101 - 111 mmol/L   CO2 9 (L) 22 - 32 mmol/L   Glucose, Bld 116 (H) 65 - 99 mg/dL   BUN 107 (H) 6 - 20 mg/dL   Creatinine, Ser 2.01 (H) 0.44 - 1.00 mg/dL   Calcium 9.4 8.9 - 10.3 mg/dL   GFR calc non Af Amer 23 (L) >60 mL/min   GFR calc Af Amer 27 (L) >60 mL/min    Comment: (NOTE) The eGFR has been calculated using the CKD EPI equation. This calculation has not been validated in all clinical situations. eGFR's persistently <60 mL/min signify possible Chronic Kidney Disease.    Anion gap 5 5 - 15  Phosphorus     Status: None   Collection Time: 06/07/15  6:14 AM  Result Value Ref Range   Phosphorus 3.2 2.5 - 4.6 mg/dL  Glucose, capillary     Status: None   Collection Time: 06/07/15  7:52 AM  Result Value Ref Range   Glucose-Capillary 78 65 - 99 mg/dL  Glucose, capillary     Status: None   Collection Time: 06/07/15  6:25 PM  Result Value Ref Range   Glucose-Capillary 74 65 - 99 mg/dL  Glucose, capillary     Status: Abnormal   Collection Time: 06/07/15  9:39 PM  Result Value Ref Range   Glucose-Capillary 108 (H) 65 - 99 mg/dL  Glucose, capillary     Status: Abnormal   Collection Time: 06/08/15 12:10 AM  Result Value Ref Range   Glucose-Capillary 131 (H) 65 - 99 mg/dL  Glucose, capillary     Status: Abnormal   Collection Time: 06/08/15  4:17 AM  Result Value Ref Range   Glucose-Capillary 162 (H) 65 - 99 mg/dL  Comprehensive metabolic panel     Status: Abnormal   Collection Time: 06/08/15  5:53 AM  Result Value Ref Range   Sodium 140 135 - 145 mmol/L   Potassium 4.0 3.5 - 5.1 mmol/L   Chloride 125 (H) 101 - 111 mmol/L   CO2 8 (L) 22 - 32 mmol/L   Glucose, Bld 151 (H) 65 - 99 mg/dL   BUN 122 (H) 6 - 20 mg/dL   Creatinine, Ser 2.50 (H) 0.44 - 1.00 mg/dL   Calcium 9.0 8.9 - 10.3 mg/dL   Total Protein 6.3 (L) 6.5 - 8.1 g/dL   Albumin 2.2 (L) 3.5 - 5.0 g/dL   AST 29 15  - 41 U/L   ALT 58 (H) 14 - 54 U/L   Alkaline Phosphatase 104 38 - 126 U/L   Total Bilirubin 0.3 0.3 - 1.2 mg/dL   GFR calc non Af Amer 18 (L) >60 mL/min   GFR calc Af Amer 21 (L) >60 mL/min    Comment: (NOTE) The eGFR has been calculated using the CKD EPI equation. This calculation has not been validated in all clinical situations. eGFR's persistently <60 mL/min signify possible Chronic Kidney Disease.    Anion gap 7 5 - 15  Magnesium     Status: Abnormal   Collection Time: 06/08/15  5:53 AM  Result Value Ref Range   Magnesium 1.6 (L) 1.7 - 2.4 mg/dL  Phosphorus     Status: None   Collection Time: 06/08/15  5:53 AM  Result Value Ref Range   Phosphorus 2.8 2.5 - 4.6 mg/dL  CBC     Status: Abnormal   Collection Time: 06/08/15  5:53 AM  Result Value Ref Range   WBC 10.2 4.0 - 10.5 K/uL   RBC 3.22 (L) 3.87 - 5.11 MIL/uL   Hemoglobin 7.2 (L) 12.0 - 15.0 g/dL   HCT 22.9 (L) 36.0 - 46.0 %   MCV 71.1 (L) 78.0 - 100.0 fL   MCH 22.4 (L) 26.0 - 34.0 pg   MCHC 31.4 30.0 - 36.0 g/dL   RDW 17.2 (H) 11.5 - 15.5 %   Platelets 189 150 - 400 K/uL  Differential     Status: Abnormal   Collection Time: 06/08/15  5:53 AM  Result Value Ref Range   Neutrophils Relative % 80 %   Neutro Abs 8.2 (H) 1.7 - 7.7 K/uL   Lymphocytes Relative 7 %   Lymphs Abs 0.7 0.7 - 4.0 K/uL   Monocytes Relative 11 %   Monocytes Absolute 1.1 (H) 0.1 - 1.0 K/uL   Eosinophils Relative 2 %   Eosinophils Absolute 0.2 0.0 - 0.7 K/uL   Basophils Relative 0 %   Basophils Absolute 0.0 0.0 - 0.1 K/uL  Prealbumin     Status: None   Collection Time: 06/08/15  5:53 AM  Result Value Ref Range   Prealbumin 23.8 18 -  38 mg/dL  Vitamin B12     Status: Abnormal   Collection Time: 06/08/15  5:53 AM  Result Value Ref Range   Vitamin B-12 924 (H) 180 - 914 pg/mL    Comment: (NOTE) This assay is not validated for testing neonatal or myeloproliferative syndrome specimens for Vitamin B12 levels.   Triglycerides     Status: None    Collection Time: 06/08/15  5:53 AM  Result Value Ref Range   Triglycerides 47 <150 mg/dL  Glucose, capillary     Status: None   Collection Time: 06/08/15  7:31 AM  Result Value Ref Range   Glucose-Capillary 68 65 - 99 mg/dL    Imaging / Studies: Ct Head Wo Contrast  06/06/2015   CLINICAL DATA:  Abnormal speech.  EXAM: CT HEAD WITHOUT CONTRAST  TECHNIQUE: Contiguous axial images were obtained from the base of the skull through the vertex without intravenous contrast.  COMPARISON:  12/28/2014  FINDINGS: Mild generalized cerebral atrophy is normal for age. There is no evidence of acute cortical infarct, intracranial hemorrhage, mass, midline shift, or extra-axial fluid collection. Periventricular white-matter hypodensities are unchanged and nonspecific but compatible with mild chronic small vessel ischemic disease.  Orbits are unremarkable. Visualized paranasal sinuses and mastoid air cells are clear. Mild atherosclerotic calcification is noted at the skullbase.  IMPRESSION: No evidence of acute intracranial abnormality.   Electronically Signed   By: Logan Bores M.D.   On: 06/06/2015 13:22    Medications / Allergies:  Scheduled Meds: . enoxaparin (LOVENOX) injection  30 mg Subcutaneous Q24H  . Influenza vac split quadrivalent PF  0.5 mL Intramuscular Tomorrow-1000  . insulin aspart  0-9 Units Subcutaneous 3 times per day  . nystatin  5 mL Oral QID  . pantoprazole (PROTONIX) IV  40 mg Intravenous QHS   Continuous Infusions: . TPN (CLINIMIX) Adult without lytes Stopped (06/08/15 0604)   And  . fat emulsion Stopped (06/08/15 0604)  .  sodium bicarbonate  infusion 1000 mL 50 mL/hr at 06/07/15 2252   PRN Meds:.acetaminophen **OR** acetaminophen, alum & mag hydroxide-simeth, methocarbamol, metoprolol, morphine injection, nitroGLYCERIN, ondansetron **OR** ondansetron (ZOFRAN) IV, sodium chloride  Antibiotics: Anti-infectives    None         Assessment/Plan S/p abdominal wound  exploration and explantation of infected mesh 04/28/15 - Tsuei EC fistula - recurrent -STRICT NPO, TPN to try to allow fistula to heal.  -eakin's pouch to suction  -accurate I&Os.  -pain control  -wound looks good and surrounding skin is healing.  -May need to consider restarting standostatin if out put goes up PCM-TPN AKI-worsening renal function.  No UOP recorded?  Pt incontinent per nursing. Dispo-SNF, not medically stable.      Erby Pian, [facility] Pager [phone]) For consults and floor pages call [phone](7A-4:30P)  06/08/2015 9:19 AM

## 2015-06-08 NOTE — Evaluation (Signed)
Occupational Therapy Evaluation Patient Details Name: Marissa [name] MRN: [id number] DOB: [birth year] Today's Date: 06/08/2015    History of Present Illness Patient is a 74 yo female admitted 06/04/15 with AMS, abdominal pain, dehydration, weakness, abdominal wounds.  PMH:  HTN, arthritis, CAD, DM, neuropathy, CHF, CKD, ostomy   Clinical Impression   This 74 yo female admitted with above presents to acute OT with decreased arousal, decreased balance/mobility, decreased cognition, generalized weakness all affecting her ability to help with her care. She will benefit from continued OT on acute with follow up at [facility].    Follow Up Recommendations  SNF    Equipment Recommendations   (TBD at next venue)       Precautions / Restrictions Precautions Precautions: Fall Precaution Comments: drain from abdominal wound Restrictions Weight Bearing Restrictions: No      Mobility Bed Mobility Overal bed mobility: Needs Assistance;+2 for physical assistance Bed Mobility: Supine to Sit     Supine to sit: Total assist;+2 for physical assistance     General bed mobility comments: Pt not assisting with bed mobility, used bed pad under her with HOB all the way up to help spin her around to EOB  Transfers Overall transfer level: Needs assistance Equipment used: 2 person hand held assist Transfers: Squat Pivot Transfers     Squat pivot transfers: Total assist;+2 physical assistance     General transfer comment: using bed pad and gait belt    Balance Overall balance assessment: Needs assistance Sitting-balance support: Feet supported;Bilateral upper extremity supported Sitting balance-Leahy Scale: Poor     Standing balance support: No upper extremity supported Standing balance-Leahy Scale: Zero                              ADL Overall ADL's : Needs assistance/impaired                                       General ADL Comments: total A for all BADLs at  any position level     Vision Additional Comments: Pt unable to tell me her level of vision (minimal communication from pt)          Pertinent Vitals/Pain Pain Assessment: Faces Faces Pain Scale: Hurts even more Pain Location: when moving her legs she then also voiced her feet her when we transerred her from bed to recliner Pain Descriptors / Indicators: Aching;Sore;Grimacing Pain Intervention(s): Monitored during session;Repositioned        Extremity/Trunk Assessment Upper Extremity Assessment Upper Extremity Assessment: RUE deficits/detail;LUE deficits/detail (based on general movements during session) RUE Deficits / Details: Strength grossly 2-/5 RUE Coordination: decreased fine motor;decreased gross motor LUE Deficits / Details: Strength grossly 2-/5 LUE Coordination: decreased fine motor;decreased gross motor              Cognition Arousal/Alertness: Lethargic Behavior During Therapy: Flat affect Overall Cognitive Status: Difficult to assess                                Home Living Family/patient expects to be discharged to:: Skilled nursing facility  OT Diagnosis: Generalized weakness;Cognitive deficits;Acute pain   OT Problem List: Decreased strength;Decreased range of motion;Decreased activity tolerance;Impaired balance (sitting and/or standing);Pain;Obesity;Decreased cognition;Decreased knowledge of use of DME or AE;Impaired UE functional use   OT Treatment/Interventions: Self-care/ADL training;Patient/family education;Balance training;DME and/or AE instruction;Therapeutic activities    OT Goals(Current goals can be found in the care plan section) Acute Rehab OT Goals Patient Stated Goal: Unable to state OT Goal Formulation: Patient unable to participate in goal setting Time For Goal Achievement: 06/22/15 Potential to Achieve Goals: Good  OT Frequency: Min 2X/week               End of Session Equipment Utilized During Treatment: Gait belt Nurse Communication: Mobility status;Need for lift equipment (maxi move; NT made aware as well)  Activity Tolerance: Patient limited by fatigue;Patient limited by lethargy Patient left: in chair;with call bell/phone within reach   Time: 1214-1234 OT Time Calculation (min): 20 min Charges:  OT General Charges $OT Visit: 1 Procedure OT Evaluation $Initial OT Evaluation Tier I: 1 Procedure  Almon Register [id number] 06/08/2015, 12:52 PM

## 2015-06-09 LAB — RENAL FUNCTION PANEL
ALBUMIN: 2.1 g/dL — AB (ref 3.5–5.0)
ANION GAP: 9 (ref 5–15)
BUN: 112 mg/dL — ABNORMAL HIGH (ref 6–20)
CHLORIDE: 119 mmol/L — AB (ref 101–111)
CO2: 17 mmol/L — ABNORMAL LOW (ref 22–32)
Calcium: 8.8 mg/dL — ABNORMAL LOW (ref 8.9–10.3)
Creatinine, Ser: 2.19 mg/dL — ABNORMAL HIGH (ref 0.44–1.00)
GFR calc Af Amer: 24 mL/min — ABNORMAL LOW (ref >60)
GFR, EST NON AFRICAN AMERICAN: 21 mL/min — AB (ref >60)
Glucose, Bld: 148 mg/dL — ABNORMAL HIGH (ref 65–99)
PHOSPHORUS: 4 mg/dL (ref 2.5–4.6)
POTASSIUM: 3.6 mmol/L (ref 3.5–5.1)
Sodium: 145 mmol/L (ref 135–145)

## 2015-06-09 LAB — CULTURE, BLOOD (ROUTINE X 2)
CULTURE: NO GROWTH
Culture: NO GROWTH

## 2015-06-09 LAB — GLUCOSE, CAPILLARY
GLUCOSE-CAPILLARY: 108 mg/dL — AB (ref 65–99)
GLUCOSE-CAPILLARY: 113 mg/dL — AB (ref 65–99)
GLUCOSE-CAPILLARY: 193 mg/dL — AB (ref 65–99)

## 2015-06-09 LAB — IRON AND TIBC
Iron: 52 ug/dL (ref 28–170)
SATURATION RATIOS: 30 % (ref 10.4–31.8)
TIBC: 174 ug/dL — ABNORMAL LOW (ref 250–450)
UIBC: 122 ug/dL

## 2015-06-09 LAB — MAGNESIUM: MAGNESIUM: 2 mg/dL (ref 1.7–2.4)

## 2015-06-09 MED ORDER — POTASSIUM CHLORIDE 10 MEQ/50ML IV SOLN
10.0000 meq | INTRAVENOUS | Status: AC
  Administered 2015-06-09 (×3): 10 meq via INTRAVENOUS
  Filled 2015-06-09 (×4): qty 50

## 2015-06-09 MED ORDER — TRACE MINERALS CR-CU-MN-SE-ZN 10-1000-500-60 MCG/ML IV SOLN
INTRAVENOUS | Status: AC
  Administered 2015-06-09: 18:00:00 via INTRAVENOUS
  Filled 2015-06-09: qty 1800

## 2015-06-09 MED ORDER — FAT EMULSION 20 % IV EMUL
240.0000 mL | INTRAVENOUS | Status: AC
  Administered 2015-06-09: 240 mL via INTRAVENOUS
  Filled 2015-06-09: qty 250

## 2015-06-09 MED ORDER — SODIUM CHLORIDE 0.9 % IV SOLN
INTRAVENOUS | Status: AC
  Administered 2015-06-09: 11:00:00 via INTRAVENOUS

## 2015-06-09 NOTE — Progress Notes (Signed)
Patient ID: Marissa [name], female   DOB: June 30, 1941, 74 y.o.   MRN: [id number]     [facility]      [address] [place], [place] [address]    Phone: [phone] FAX: [phone]     Subjective: Output not recorded.  Suction tube on the side, ~82ml in eakin's pouch.  360ml in the cannister so she probably had about 139ml out/24h.    Flat affect.    Answers questions appropriately.   Objective:  Vital signs:  Filed Vitals:   06/08/15 2045 06/08/15 2242 06/09/15 0600 06/09/15 0826  BP: 131/65 117/75 116/65 98/53  Pulse: 100 88 84 126  Temp: 98.3 F (36.8 C) 97.5 F (36.4 C) 97.6 F (36.4 C) 98.6 F (37 C)  TempSrc: Oral Oral Oral Oral  Resp: $Remo'16 16 16 18  'hsEnB$ Height:      Weight:      SpO2: 100% 89% 90% 100%    Last BM Date:  (pta MD Xu aware, patient on TPN)  Intake/Output   Yesterday:  09/26 0701 - 09/27 0700 In: 2252.9 [I.V.:1252.9; IV Piggyback:1000] Out: -  This shift: I/O last 3 completed shifts: In: 4302.4 [I.V.:1702.9; IV Piggyback:1000] Out: 150 [Drains:150]    Physical Exam: General: Pt awake/alert/oriented x4 in no acute distress  Abdomen: Soft. Nondistended. Non tender.  eakin's pouch in place, NGT placed back in pouch with a good seal on it.   Surrounding skin is less excoriated. No evidence of peritonitis. No incarcerated hernias.  Problem List:   Active Problems:   Dehydration   Encephalopathy   Abdominal pain   Acute renal failure    Results:   Labs: Results for orders placed or performed during the hospital encounter of 06/04/15 (from the past 48 hour(s))  Glucose, capillary     Status: None   Collection Time: 06/07/15  6:25 PM  Result Value Ref Range   Glucose-Capillary 74 65 - 99 mg/dL  Glucose, capillary     Status: Abnormal   Collection Time: 06/07/15  9:39 PM  Result Value Ref Range   Glucose-Capillary 108 (H) 65 - 99 mg/dL  Glucose, capillary     Status: Abnormal   Collection Time: 06/08/15 12:10 AM  Result Value Ref Range   Glucose-Capillary 131 (H) 65 - 99 mg/dL  Glucose, capillary     Status: Abnormal   Collection Time: 06/08/15  4:17 AM  Result Value Ref Range   Glucose-Capillary 162 (H) 65 - 99 mg/dL  Comprehensive metabolic panel     Status: Abnormal   Collection Time: 06/08/15  5:53 AM  Result Value Ref Range   Sodium 140 135 - 145 mmol/L   Potassium 4.0 3.5 - 5.1 mmol/L   Chloride 125 (H) 101 - 111 mmol/L   CO2 8 (L) 22 - 32 mmol/L   Glucose, Bld 151 (H) 65 - 99 mg/dL   BUN 122 (H) 6 - 20 mg/dL   Creatinine, Ser 2.50 (H) 0.44 - 1.00 mg/dL   Calcium 9.0 8.9 - 10.3 mg/dL   Total Protein 6.3 (L) 6.5 - 8.1 g/dL   Albumin 2.2 (L) 3.5 - 5.0 g/dL   AST 29 15 - 41 U/L   ALT 58 (H) 14 - 54 U/L   Alkaline Phosphatase 104 38 - 126 U/L   Total Bilirubin 0.3 0.3 - 1.2 mg/dL   GFR calc non Af Amer 18 (L) >60 mL/min   GFR calc Af Amer 21 (L) >60 mL/min  Comment: (NOTE) The eGFR has been calculated using the CKD EPI equation. This calculation has not been validated in all clinical situations. eGFR's persistently <60 mL/min signify possible Chronic Kidney Disease.    Anion gap 7 5 - 15  Magnesium     Status: Abnormal   Collection Time: 06/08/15  5:53 AM  Result Value Ref Range   Magnesium 1.6 (L) 1.7 - 2.4 mg/dL  Phosphorus     Status: None   Collection Time: 06/08/15  5:53 AM  Result Value Ref Range   Phosphorus 2.8 2.5 - 4.6 mg/dL  CBC     Status: Abnormal   Collection Time: 06/08/15  5:53 AM  Result Value Ref Range   WBC 10.2 4.0 - 10.5 K/uL   RBC 3.22 (L) 3.87 - 5.11 MIL/uL   Hemoglobin 7.2 (L) 12.0 - 15.0 g/dL   HCT 22.9 (L) 36.0 - 46.0 %   MCV 71.1 (L) 78.0 - 100.0 fL   MCH 22.4 (L) 26.0 - 34.0 pg   MCHC 31.4 30.0 - 36.0 g/dL   RDW 17.2 (H) 11.5 - 15.5 %   Platelets 189 150 - 400 K/uL  Differential     Status: Abnormal   Collection Time: 06/08/15  5:53 AM  Result Value Ref Range   Neutrophils Relative % 80 %   Neutro Abs  8.2 (H) 1.7 - 7.7 K/uL   Lymphocytes Relative 7 %   Lymphs Abs 0.7 0.7 - 4.0 K/uL   Monocytes Relative 11 %   Monocytes Absolute 1.1 (H) 0.1 - 1.0 K/uL   Eosinophils Relative 2 %   Eosinophils Absolute 0.2 0.0 - 0.7 K/uL   Basophils Relative 0 %   Basophils Absolute 0.0 0.0 - 0.1 K/uL  Prealbumin     Status: None   Collection Time: 06/08/15  5:53 AM  Result Value Ref Range   Prealbumin 23.8 18 - 38 mg/dL  Vitamin B12     Status: Abnormal   Collection Time: 06/08/15  5:53 AM  Result Value Ref Range   Vitamin B-12 924 (H) 180 - 914 pg/mL    Comment: (NOTE) This assay is not validated for testing neonatal or myeloproliferative syndrome specimens for Vitamin B12 levels.   Triglycerides     Status: None   Collection Time: 06/08/15  5:53 AM  Result Value Ref Range   Triglycerides 47 <150 mg/dL  Glucose, capillary     Status: None   Collection Time: 06/08/15  7:31 AM  Result Value Ref Range   Glucose-Capillary 68 65 - 99 mg/dL  Glucose, capillary     Status: None   Collection Time: 06/08/15 11:24 AM  Result Value Ref Range   Glucose-Capillary 95 65 - 99 mg/dL  Lactic acid, plasma     Status: None   Collection Time: 06/08/15  3:30 PM  Result Value Ref Range   Lactic Acid, Venous 0.7 0.5 - 2.0 mmol/L  Glucose, capillary     Status: Abnormal   Collection Time: 06/08/15  4:53 PM  Result Value Ref Range   Glucose-Capillary 123 (H) 65 - 99 mg/dL  Glucose, capillary     Status: Abnormal   Collection Time: 06/08/15  8:55 PM  Result Value Ref Range   Glucose-Capillary 207 (H) 65 - 99 mg/dL  Iron and TIBC     Status: Abnormal   Collection Time: 06/09/15  6:00 AM  Result Value Ref Range   Iron 52 28 - 170 ug/dL   TIBC 174 (L) 250 -  450 ug/dL   Saturation Ratios 30 10.4 - 31.8 %   UIBC 122 ug/dL  Renal function panel     Status: Abnormal   Collection Time: 06/09/15  6:00 AM  Result Value Ref Range   Sodium 145 135 - 145 mmol/L   Potassium 3.6 3.5 - 5.1 mmol/L   Chloride 119 (H)  101 - 111 mmol/L   CO2 17 (L) 22 - 32 mmol/L   Glucose, Bld 148 (H) 65 - 99 mg/dL   BUN 112 (H) 6 - 20 mg/dL   Creatinine, Ser 2.19 (H) 0.44 - 1.00 mg/dL   Calcium 8.8 (L) 8.9 - 10.3 mg/dL   Phosphorus 4.0 2.5 - 4.6 mg/dL   Albumin 2.1 (L) 3.5 - 5.0 g/dL   GFR calc non Af Amer 21 (L) >60 mL/min   GFR calc Af Amer 24 (L) >60 mL/min    Comment: (NOTE) The eGFR has been calculated using the CKD EPI equation. This calculation has not been validated in all clinical situations. eGFR's persistently <60 mL/min signify possible Chronic Kidney Disease.    Anion gap 9 5 - 15  Magnesium     Status: None   Collection Time: 06/09/15  6:00 AM  Result Value Ref Range   Magnesium 2.0 1.7 - 2.4 mg/dL  Glucose, capillary     Status: Abnormal   Collection Time: 06/09/15  7:49 AM  Result Value Ref Range   Glucose-Capillary 108 (H) 65 - 99 mg/dL    Imaging / Studies: US Renal  06/08/2015   CLINICAL DATA:  Elevated creatinine  EXAM: RENAL / URINARY TRACT ULTRASOUND COMPLETE  COMPARISON:  CT from 4 days ago  FINDINGS: Right Kidney:  Length: 10 cm. 12 mm interpolar cyst. Lobulated renal cortex with thinning. No hydronephrosis. No evidence of solid mass.  Left Kidney:  Length: 10 cm. Lobulated renal cortex with thinning. No hydronephrosis. No evidence of mass.  Bladder:  Appears normal for degree of bladder distention.  IMPRESSION: 1. No hydronephrosis. 2. Renal atrophy.   Electronically Signed   By: Monte Fantasia M.D.   On: 06/08/2015 21:35    Medications / Allergies:  Scheduled Meds: . enoxaparin (LOVENOX) injection  30 mg Subcutaneous Q24H  . nystatin  5 mL Oral QID  . octreotide  100 mcg Subcutaneous Q12H  . pantoprazole (PROTONIX) IV  40 mg Intravenous QHS   Continuous Infusions: . sodium chloride 100 mL/hr at 06/09/15 0116  .  sodium bicarbonate  infusion 1000 mL 125 mL/hr at 06/08/15 1500   PRN Meds:.acetaminophen **OR** acetaminophen, alum & mag hydroxide-simeth, methocarbamol, metoprolol,  morphine injection, nitroGLYCERIN, ondansetron **OR** ondansetron (ZOFRAN) IV, sodium chloride  Antibiotics: Anti-infectives    None       Assessment/Plan S/p abdominal wound exploration and explantation of infected mesh 04/28/15 - Tsuei EC fistula - recurrent -NPO for now.  TPN to try to allow fistula to heal. Will consider clears at some point.  -eakin's pouch to suction  -accurate I&Os.  -pain control  -wound looks good and surrounding skin is healing.  -standostatin restarted 9/26 PCM-TPN AKI-renal on board. Dispo-SNF, not medically stable yet.    Erby Pian, [facility] Surgery Pager [phone]) For consults and floor pages call [phone](7A-4:30P)  06/09/2015 8:35 AM

## 2015-06-09 NOTE — Consult Note (Signed)
WOC wound follow up Current fistula pouch is leaking onto denuded lower abd skin. It will be difficult to maintain the seal for more than 24 hours until denuded weeping areas have resolved. Cleansed all the skin and cut a small Eakin pouch to fit the perimeter of the wound, she has one fistula that is actively draining green effluent at 12 o'clock.Wound is full thickness; 100% beefy red with mod amt green drainage, no odor. Lower abd with partial thickness skin loss, red and moist with small amt in patchy areas, small amt bleeding. Crusted with ostomy powder and skin prep to protect skin, very painful process for the patient. There are several creases which occur when patient leans forward; difficult pouching situation. Built up the midline wound just distal to the edge of the perimeter of the Eakin pouch with several strips of eakin barrier to create a dam and flatten this area. Placed pouch to LWS with suction catheter to allow for the pouch to stay continuously drained and allow the skin some time to heal while she is in the hospital. Mod amt green drainage in cannister. We do this routinely when the skin has this much damage or the output can not be managed effectively with the pouch alone. If the patient is d/c to home will need to just DC the suction and cap off the Eakin for manual drainage. Foam dressing applied to lower abd to absorb drainage and promote healing. Supplies at bedside and instructions provided for bedside nurses. Julien Girt MSN, RN, [place], [place], [place]

## 2015-06-09 NOTE — Progress Notes (Signed)
PROGRESS NOTE  Marissa [name] XBL:[id number] DOB: 12-06-1940 DOA: 06/04/2015 PCP: [name], MD  HPI/Recap of past 24 hours:  Very weak and frail female, open eyes, whispers, attempt to follow command. Per RN, patient has been refusing meds, patient is more awake when family is around  Assessment/Plan: Active Problems:   Dehydration   Encephalopathy   Abdominal pain   Acute renal failure  Confusion/encephalopathy:  Fluctuation in alertness, per RN patient is more alert when family is around. CT head no acute findings. 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 Nephrology consulted due to worsening of renal function.  9/27 cr improving  Metabolic acidosis:  start bicarb/d5 from 9/25, persistent acidosis, push 1amp of bicarb on 9/26, adjust bicarb ivf to 3amp in d5 running at 75cc/hr. Improving, now renal on board to adjust bicarb rate.  Dehydration in the setting of draining wound and strict npo, total fluids requirement per day at least 35cc/kg, currently she is getting tpn total volume of 1.8liter.  Fluids management per renal.   H/o HTN: on lopressor QD at home, patient bp low normal, d/ced scheduled lopressor on 9/24, change to prn iv lopressor for sbp >160 or heart rate >120. Will monitor.  H/o chronic anemia, will close monitor, no need of transfusion yet. (h/o transfusion reaction per chart, but daughter denies), iron study pending.  H/o NIDDM2: last A1c 5.7 in 03/2015, on ssi here  H/o gout: stable, colchicine held due to ARF, consider uloric at discharge  Chronic enterocutaneuos fistula: s/p Eakin pouch, difficult seal , currently on LWS, npo and TPN. management per woc and general surgery. sandostatin started on 9/26 per surgery  FTT: prognosis guarded,  need snf.  Code Status: full, confirmed with daughter  Family Communication: patient , son and  daughter, discussed with family member about poor prognosis.  Disposition Plan: SNF   Consultants:  General surgery  WOC  nephrology  Procedures:  Chronic TPN  Antibiotics:  none   Objective: BP 125/60 mmHg  Pulse 107  Temp(Src) 99.5 F (37.5 C) (Oral)  Resp 18  Ht 5\' 4"  (1.626 m)  Wt 225 lb (102.059 kg)  BMI 38.60 kg/m2  SpO2 100%  Intake/Output Summary (Last 24 hours) at 06/09/15 2016 Last data filed at 06/09/15 1817  Gross per 24 hour  Intake      0 ml  Output    300 ml  Net   -300 ml   Filed Weights   06/04/15 1003  Weight: 225 lb (102.059 kg)    Exam:   General: very frail, open eyes but minimal responsive, maintain airway  Cardiovascular: RRR  Respiratory: diminished, but no rales, no rhonchi, no wheezing  Abdomen: Eakin pouch conected to LWS, positive BS  Musculoskeletal: No Edema  Neuro: very lethargic, not talking , but open eyes  Data Reviewed: Basic Metabolic Panel:  Recent Labs Lab 06/05/15 0500 06/06/15 0522 06/07/15 0614 06/08/15 0553 06/09/15 0600  NA 141 144 144 140 145  K 5.0 4.7 4.7 4.0 3.6  CL 128* 129* 130* 125* 119*  CO2 9* 9* 9* 8* 17*  GLUCOSE 114* 111* 116* 151* 148*  BUN 105* 98* 107* 122* 112*  CREATININE 2.03* 1.97* 2.01* 2.50* 2.19*  CALCIUM 9.6 9.5 9.4 9.0 8.8*  MG 2.7* 2.0  --  1.6* 2.0  PHOS 6.2* 3.6 3.2 2.8 4.0   Liver Function  Tests:  Recent Labs Lab 06/04/15 1050 06/05/15 0500 06/08/15 0553 06/09/15 0600  AST 54* 35 29  --   ALT 108* 84* 58*  --   ALKPHOS 114 107 104  --   BILITOT 0.2* 0.5 0.3  --   PROT 7.5 6.4* 6.3*  --   ALBUMIN 2.9* 2.5* 2.2* 2.1*   No results for input(s): LIPASE, AMYLASE in the last 168 hours. No results for input(s): AMMONIA in the last 168 hours. CBC:  Recent Labs Lab 06/04/15 1050 06/04/15 1113 06/05/15 0500 06/08/15 0553  WBC 11.9*  --  8.1 10.2  NEUTROABS 9.2*  --  5.9 8.2*  HGB 8.9* 9.5* 7.8* 7.2*  HCT 27.6* 28.0* 24.2* 22.9*  MCV 71.1*  --   70.3* 71.1*  PLT 271  --  239 189   Cardiac Enzymes:   No results for input(s): CKTOTAL, CKMB, CKMBINDEX, TROPONINI in the last 168 hours. BNP (last 3 results)  Recent Labs  11/02/14 2340 03/19/15 1325  BNP 109.2* 141.6*    ProBNP (last 3 results) No results for input(s): PROBNP in the last 8760 hours.  CBG:  Recent Labs Lab 06/08/15 1124 06/08/15 1653 06/08/15 2055 06/09/15 0749 06/09/15 1818  GLUCAP 95 123* 207* 108* 113*    Recent Results (from the past 240 hour(s))  Urine culture     Status: None   Collection Time: 06/04/15 10:39 AM  Result Value Ref Range Status   Specimen Description URINE, CATHETERIZED  Final   Special Requests NONE  Final   Culture NO GROWTH 1 DAY  Final   Report Status 06/05/2015 FINAL  Final  Blood Culture (routine x 2)     Status: None   Collection Time: 06/04/15 10:50 AM  Result Value Ref Range Status   Specimen Description BLOOD PICC LINE  Final   Special Requests BOTTLES DRAWN AEROBIC AND ANAEROBIC 5CC  Final   Culture NO GROWTH 5 DAYS  Final   Report Status 06/09/2015 FINAL  Final  Blood Culture (routine x 2)     Status: None   Collection Time: 06/04/15 11:00 AM  Result Value Ref Range Status   Specimen Description BLOOD RIGHT HAND  Final   Special Requests BOTTLES DRAWN AEROBIC AND ANAEROBIC 5CC  Final   Culture NO GROWTH 5 DAYS  Final   Report Status 06/09/2015 FINAL  Final     Studies: US Renal  06/08/2015   CLINICAL DATA:  Elevated creatinine  EXAM: RENAL / URINARY TRACT ULTRASOUND COMPLETE  COMPARISON:  CT from 4 days ago  FINDINGS: Right Kidney:  Length: 10 cm. 12 mm interpolar cyst. Lobulated renal cortex with thinning. No hydronephrosis. No evidence of solid mass.  Left Kidney:  Length: 10 cm. Lobulated renal cortex with thinning. No hydronephrosis. No evidence of mass.  Bladder:  Appears normal for degree of bladder distention.  IMPRESSION: 1. No hydronephrosis. 2. Renal atrophy.   Electronically Signed   By: Monte Fantasia M.D.   On: 06/08/2015 21:35    Scheduled Meds: . enoxaparin (LOVENOX) injection  30 mg Subcutaneous Q24H  . nystatin  5 mL Oral QID  . octreotide  100 mcg Subcutaneous Q12H  . pantoprazole (PROTONIX) IV  40 mg Intravenous QHS    Continuous Infusions: . sodium chloride 100 mL/hr at 06/09/15 1040  . TPN (CLINIMIX) Adult without lytes 170 mL/hr at 06/09/15 1907   And  . fat emulsion 240 mL (06/09/15 1753)  .  sodium bicarbonate  infusion 1000 mL 75  mL/hr at 06/09/15 1140     Time spent: 76mins  Xu,Fang MD, PhD  [facility] Pager [phone]. If 7PM-7AM, please contact night-coverage at [email or web address], password [facility] 06/09/2015, 8:16 PM  LOS: 5 days

## 2015-06-09 NOTE — Care Management Note (Signed)
Case Management Note  Patient Details  Name: Marissa [name] MRN: [id number] Date of Birth: Feb 10, 1941  Subjective/Objective:        CM following for progression and d/c planning.            Action/Plan: Plan is for pt to d/c to SNF, CSW, Lorriane Shire working with this pt on placement.   Expected Discharge Date:      06/10/2015            Expected Discharge Plan:  Skilled Nursing Facility  In-House Referral:  Clinical Social Work  Discharge planning Services  NA  Post Acute Care Choice:  NA Choice offered to:  NA  DME Arranged:    DME Agency:     HH Arranged:    [place] Agency:     Status of Service:  Completed, signed off  Medicare Important Message Given:  Yes-second notification given Date Medicare IM Given:    Medicare IM give by:    Date Additional Medicare IM Given:    Additional Medicare Important Message give by:     If discussed at [place] of Stay Meetings, dates discussed:    Additional Comments:  Adron Bene, RN 06/09/2015, 1:37 PM

## 2015-06-09 NOTE — Progress Notes (Signed)
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  No Known Allergies  Patient Measurements: Height: 5\' 4"  (162.6 cm) Weight: 225 lb (102.059 kg) IBW/kg (Calculated) : 54.7  Vital Signs: Temp: 97.6 F (36.4 C) (09/27 0600) Temp Source: Oral (09/27 0600) BP: 116/65 mmHg (09/27 0600) Pulse Rate: 84 (09/27 0600) Intake/Output from previous day: 09/26 0701 - 09/27 0700 In: 2252.9 [I.V.:1252.9; IV Piggyback:1000] Out: -   Labs:  Recent Labs  06/08/15 0553  WBC 10.2  HGB 7.2*  HCT 22.9*  PLT 189     Recent Labs  06/07/15 0614 06/08/15 0553 06/09/15 0600  NA 144 140 145  K 4.7 4.0 3.6  CL 130* 125* 119*  CO2 9* 8* 17*  GLUCOSE 116* 151* 148*  BUN 107* 122* 112*  CREATININE 2.01* 2.50* 2.19*  CALCIUM 9.4 9.0 8.8*  MG  --  1.6* 2.0  PHOS 3.2 2.8 4.0  PROT  --  6.3*  --   ALBUMIN  --  2.2* 2.1*  AST  --  29  --   ALT  --  58*  --   ALKPHOS  --  104  --   BILITOT  --  0.3  --   PREALBUMIN  --  23.8  --   TRIG  --  47  --    Estimated Creatinine Clearance: 26.2 mL/min (by C-G formula based on Cr of 2.19).    Recent Labs  06/08/15 1653 06/08/15 2055 06/09/15 0749  GLUCAP 123* 207* 108*       Insulin Requirements in the past 24 hours:  SSI d/c'ed 06/08/15 (no insulin in home TPN)  Assessment: 73 YOF with an extensive surgical history to continue on TPN for EC fistula. CT on admit shows no acute abnormality.  Admit: AMS, abd pain Surgeries/Procedures: hx left hemicolectomy with left transverse colon to sigmoid colon anastomosis for diverticulitis, umbilical hernia repair, repair of incarcerated ventral incisional hernia in 2009, open abd hysterectomy in 2013, abdominal wound exploration of EC fistula and abscess in 04/28/2015  GI: GERD / esophagitis / diverticulitis / occasional bowel incontinence / high output EC fistula. Fistula with bilious output. NPO to allow fistula to heal;  prealbumin WNL at 23.8.  Eakin's pouch to suction with 15mL out per NP, greenish yellow output. Octreotide started 9/26, PPI IV Endo: gout (colchicine held d/t AKI, MD considering Uloric). DM2 - no insulin in home TPN. CBGs have been fairly controlled. Lytes: K+ low normal, CL/CO2 improving, CoCa WNL at 10.32 (Ca x Phos = 41, goal < 55).  No lytes in TPN on 9/23-9/25 Renal: CKD3 - SCr improved to 2.19 (baseline 1.23, early Sept was 1.45), BUN improved to 112, no UOP - bicarb gtt at 125 ml/hr Pulm: OSA on CPAP - stable on RA Cards: HTN / HLD / CAD / CHF (EF 55-60%) - VSS Hepatobil: AST/ALT improving, tbili WNL.  TG WNL. Best Practices: Lovenox TPN Access: PICC line placed 04/27/15 TPN start date: 9/23 >>  Current Nutrition:  Cyclic TPN at home (last 1/88)  Home TPN: Cyclic compounded 3:1 TPN with electrolytes over 12 hours at home = 1900 kCal and 90g protein per day  Nutrition Goals: 1700-1900 kCal and 100-110 gm protein per day   Plan:  - Clinimix 5/15 (without lytes), infuse 1864mL over 12 hrs:  50 ml/hr x 1 hr, then 170 ml/hr x 10 hrs, then 50 ml/hr x 1 hr - IVFE at 20  ml/hr x 12 hrs - TPN provides 1800 kCal and 90 gm protein daily, meeting 100% of kCal and 90% of minimal protein needs.  Goal protein for CRF is ~0.8 g/kg/d, currently providing 0.88 g/kg/d. - Daily multivitamin and trace elements - KCL x 3 runs - D/C CBG checks - Bicarb gtt at 125 ml/hr per MD - F/U AM labs.  Checking labs more frequently to establish trend.  Patient has required several days of TPN without lytes.   Thuy D. Mina Marble, PharmD, BCPS Pager:  [phone] 06/09/2015, 8:37 AM

## 2015-06-09 NOTE — Progress Notes (Signed)
[facility] Progress Note    Assessment/ Plan:   1. AKI on CKD Stage III: Likely due to volume depletion with ischemic ATN component. Cr has improved from 2.5 to 2.19 today after receiving NS at 100 ml/hr for last 18 hours. Will continue NS at 100 ml/hr for additional 12 hours and monitor Cr in morning.  2. Acute Encephalopathy: Likely related to her AKI. However, daughter notes patient is at her baseline mental status. Seems more alert this morning after additional hydration. Will continue to monitor.  3. 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 Currently being managed by wound care and general surgery.  4. Metabolic Acidosis: Improved. Bicarb currently 17 from 8. Decrease sodium bicarb to 75 ml/hr. Lactic acid normal at 0.7.  5. Microcytic Anemia: Hgb 8.9 on admission, currently 7.2. MCV 70-71. Iron normal at 52, TIBC low at 174, and sat 30%. Likely secondary to anemia of chronic disease.  Transfuse for Hgb < 7.0.  6. Hypomagnesemia: Mg 2.0 today. Resolved. Continue to monitor.   DVT PPX : Lovenox SQ  Subjective:   No acute events overnight. Patient much more alert and perkier this morning. Cr down.    Objective:   BP 116/65 mmHg  Pulse 84  Temp(Src) 97.6 F (36.4 C) (Oral)  Resp 16  Ht 5\' 4"  (1.626 m)  Wt 225 lb (102.059 kg)  BMI 38.60 kg/m2  SpO2 90%  Intake/Output Summary (Last 24 hours) at 06/09/15 0805 Last data filed at 06/08/15 1800  Gross per 24 hour  Intake 2252.92 ml  Output      0 ml  Net 2252.92 ml   Weight change:   Physical Exam: Gen: resting in bed, NAD CVS: RRR, no m/g/r Resp: CTA bilaterally, breaths non-labored Abd: BS+, soft, non-tender. Eakin pouch in mid-abdomen.  Ext: warm, no peripheral edema   Imaging: US Renal  06/08/2015   CLINICAL DATA:  Elevated creatinine  EXAM: RENAL / URINARY TRACT ULTRASOUND COMPLETE  COMPARISON:  CT from 4 days ago  FINDINGS: Right Kidney:  Length: 10 cm. 12 mm interpolar cyst.  Lobulated renal cortex with thinning. No hydronephrosis. No evidence of solid mass.  Left Kidney:  Length: 10 cm. Lobulated renal cortex with thinning. No hydronephrosis. No evidence of mass.  Bladder:  Appears normal for degree of bladder distention.  IMPRESSION: 1. No hydronephrosis. 2. Renal atrophy.   Electronically Signed   By: Monte Fantasia M.D.   On: 06/08/2015 21:35    Labs: BMET  Recent Labs Lab 06/04/15 1050 06/04/15 1113 06/05/15 0500 06/06/15 0522 06/07/15 0614 06/08/15 0553 06/09/15 0600  NA 138 141 141 144 144 140 145  K 5.8* 5.7* 5.0 4.7 4.7 4.0 3.6  CL 120* 123* 128* 129* 130* 125* 119*  CO2 6*  --  9* 9* 9* 8* 17*  GLUCOSE 111* 112* 114* 111* 116* 151* 148*  BUN 130* 133* 105* 98* 107* 122* 112*  CREATININE 2.13* 2.20* 2.03* 1.97* 2.01* 2.50* 2.19*  CALCIUM 10.4*  --  9.6 9.5 9.4 9.0 8.8*  PHOS  --   --  6.2* 3.6 3.2 2.8 4.0   CBC  Recent Labs Lab 06/04/15 1050 06/04/15 1113 06/05/15 0500 06/08/15 0553  WBC 11.9*  --  8.1 10.2  NEUTROABS 9.2*  --  5.9 8.2*  HGB 8.9* 9.5* 7.8* 7.2*  HCT 27.6* 28.0* 24.2* 22.9*  MCV 71.1*  --  70.3* 71.1*  PLT 271  --  239 189    Medications:    .  enoxaparin (LOVENOX) injection  30 mg Subcutaneous Q24H  . nystatin  5 mL Oral QID  . octreotide  100 mcg Subcutaneous Q12H  . pantoprazole (PROTONIX) IV  40 mg Intravenous QHS      Albin Felling, MD, MPH Internal Medicine Resident, PGY-II Pager: [phone]    06/09/2015, 8:05 AM

## 2015-06-10 DIAGNOSIS — G9340 Encephalopathy, unspecified: Secondary | ICD-10-CM

## 2015-06-10 DIAGNOSIS — R1013 Epigastric pain: Secondary | ICD-10-CM

## 2015-06-10 DIAGNOSIS — E860 Dehydration: Secondary | ICD-10-CM

## 2015-06-10 DIAGNOSIS — N179 Acute kidney failure, unspecified: Secondary | ICD-10-CM

## 2015-06-10 LAB — BASIC METABOLIC PANEL
ANION GAP: 7 (ref 5–15)
BUN: 94 mg/dL — AB (ref 6–20)
CALCIUM: 8.2 mg/dL — AB (ref 8.9–10.3)
CO2: 21 mmol/L — AB (ref 22–32)
CREATININE: 1.92 mg/dL — AB (ref 0.44–1.00)
Chloride: 115 mmol/L — ABNORMAL HIGH (ref 101–111)
GFR calc Af Amer: 29 mL/min — ABNORMAL LOW (ref >60)
GFR, EST NON AFRICAN AMERICAN: 25 mL/min — AB (ref >60)
GLUCOSE: 172 mg/dL — AB (ref 65–99)
Potassium: 3.2 mmol/L — ABNORMAL LOW (ref 3.5–5.1)
Sodium: 143 mmol/L (ref 135–145)

## 2015-06-10 LAB — URINE MICROSCOPIC-ADD ON

## 2015-06-10 LAB — GLUCOSE, CAPILLARY
Glucose-Capillary: 93 mg/dL (ref 65–99)
Glucose-Capillary: 193 mg/dL — ABNORMAL HIGH (ref 65–99)

## 2015-06-10 LAB — PHOSPHORUS: Phosphorus: 2 mg/dL — ABNORMAL LOW (ref 2.5–4.6)

## 2015-06-10 LAB — URINALYSIS, ROUTINE W REFLEX MICROSCOPIC
BILIRUBIN URINE: NEGATIVE
GLUCOSE, UA: NEGATIVE mg/dL
KETONES UR: NEGATIVE mg/dL
LEUKOCYTES UA: NEGATIVE
NITRITE: NEGATIVE
PH: 5.5 (ref 5.0–8.0)
PROTEIN: NEGATIVE mg/dL
Specific Gravity, Urine: 1.014 (ref 1.005–1.030)
Urobilinogen, UA: 0.2 mg/dL (ref 0.0–1.0)

## 2015-06-10 LAB — VITAMIN B1: VITAMIN B1 (THIAMINE): 155.7 nmol/L (ref 66.5–200.0)

## 2015-06-10 MED ORDER — ENOXAPARIN SODIUM 40 MG/0.4ML ~~LOC~~ SOLN
40.0000 mg | SUBCUTANEOUS | Status: DC
  Administered 2015-06-10 – 2015-06-15 (×6): 40 mg via SUBCUTANEOUS
  Filled 2015-06-10 (×6): qty 0.4

## 2015-06-10 MED ORDER — FAT EMULSION 20 % IV EMUL
240.0000 mL | INTRAVENOUS | Status: AC
  Administered 2015-06-10: 240 mL via INTRAVENOUS
  Filled 2015-06-10: qty 250

## 2015-06-10 MED ORDER — SODIUM PHOSPHATE 3 MMOLE/ML IV SOLN
10.0000 mmol | Freq: Once | INTRAVENOUS | Status: AC
  Administered 2015-06-10: 10 mmol via INTRAVENOUS
  Filled 2015-06-10: qty 3.33

## 2015-06-10 MED ORDER — TRACE MINERALS CR-CU-MN-SE-ZN 10-1000-500-60 MCG/ML IV SOLN
INTRAVENOUS | Status: AC
  Administered 2015-06-10: 18:00:00 via INTRAVENOUS
  Filled 2015-06-10: qty 1800

## 2015-06-10 MED ORDER — POTASSIUM CHLORIDE 10 MEQ/50ML IV SOLN
10.0000 meq | INTRAVENOUS | Status: AC
  Administered 2015-06-10 (×3): 10 meq via INTRAVENOUS
  Filled 2015-06-10 (×3): qty 50

## 2015-06-10 MED ORDER — SODIUM CHLORIDE 0.9 % IV SOLN
INTRAVENOUS | Status: DC
  Administered 2015-06-10 – 2015-06-12 (×4): via INTRAVENOUS

## 2015-06-10 MED ORDER — SODIUM BICARBONATE 8.4 % IV SOLN
50.0000 meq | Freq: Once | INTRAVENOUS | Status: AC
  Administered 2015-06-10: 50 meq via INTRAVENOUS
  Filled 2015-06-10: qty 50

## 2015-06-10 NOTE — Progress Notes (Signed)
Patient ID: Marissa [name], female   DOB: 1941/07/12, 73 y.o.   MRN: [id number]      [facility]      [address] [place], [place] [address]    Phone: [phone] FAX: [phone]     Subjective: Denies abdominal pain.  Fistula output has increased, 876m/24h.    Objective:  Vital signs:  Filed Vitals:   06/09/15 0826 06/09/15 1816 06/09/15 2029 06/10/15 0420  BP: 98/53 125/60 129/50 137/63  Pulse: 126 107 114 120  Temp: 98.6 F (37 C) 99.5 F (37.5 C) 98.4 F (36.9 C) 97.8 F (36.6 C)  TempSrc: Oral Oral Oral Oral  Resp: _0 Height:      Weight:    105.3 kg (232 lb 2.3 oz)  SpO2: 100% 100% 100% 100%    Last BM Date:  (pta MD Xu aware, patient on TPN)  Intake/Output   Yesterday:  09/27 0701 - 09/28 0700 In: 0  Out: 800 [Drains:800] This shift:    I/O last 3 completed shifts: In: 0  Out: 800 [Drains:800]     Physical Exam: General: Pt awake/alert/oriented x4 in no acute distress  Abdomen: Soft. Nondistended. Non tender. eakin's pouch in place with NGT to suction.  Just changed by WOC.  No output yet.     Problem List:   Active Problems:   Dehydration   Encephalopathy   Abdominal pain   Acute renal failure    Results:   Labs: Results for orders placed or performed during the hospital encounter of 06/04/15 (from the past 48 hour(s))  Glucose, capillary     Status: None   Collection Time: 06/08/15 11:24 AM  Result Value Ref Range   Glucose-Capillary 95 65 - 99 mg/dL  Lactic acid, plasma     Status: None   Collection Time: 06/08/15  3:30 PM  Result Value Ref Range   Lactic Acid, Venous 0.7 0.5 - 2.0 mmol/L  Glucose, capillary     Status: Abnormal   Collection Time: 06/08/15  4:53 PM  Result Value Ref Range   Glucose-Capillary 123 (H) 65 - 99 mg/dL  Glucose, capillary     Status: Abnormal   Collection Time: 06/08/15  8:55 PM  Result Value Ref Range   Glucose-Capillary 207 (H) 65 -  99 mg/dL  Iron and TIBC     Status: Abnormal   Collection Time: 06/09/15  6:00 AM  Result Value Ref Range   Iron 52 28 - 170 ug/dL   TIBC 174 (L) 250 - 450 ug/dL   Saturation Ratios 30 10.4 - 31.8 %   UIBC 122 ug/dL  Renal function panel     Status: Abnormal   Collection Time: 06/09/15  6:00 AM  Result Value Ref Range   Sodium 145 135 - 145 mmol/L   Potassium 3.6 3.5 - 5.1 mmol/L   Chloride 119 (H) 101 - 111 mmol/L   CO2 17 (L) 22 - 32 mmol/L   Glucose, Bld 148 (H) 65 - 99 mg/dL   BUN 112 (H) 6 - 20 mg/dL   Creatinine, Ser 2.19 (H) 0.44 - 1.00 mg/dL   Calcium 8.8 (L) 8.9 - 10.3 mg/dL   Phosphorus 4.0 2.5 - 4.6 mg/dL   Albumin 2.1 (L) 3.5 - 5.0 g/dL   GFR calc non Af Amer 21 (L) >60 mL/min   GFR calc Af Amer 24 (L) >60 mL/min    Comment: (NOTE) The eGFR has been  calculated using the CKD EPI equation. This calculation has not been validated in all clinical situations. eGFR's persistently <60 mL/min signify possible Chronic Kidney Disease.    Anion gap 9 5 - 15  Magnesium     Status: None   Collection Time: 06/09/15  6:00 AM  Result Value Ref Range   Magnesium 2.0 1.7 - 2.4 mg/dL  Glucose, capillary     Status: Abnormal   Collection Time: 06/09/15  7:49 AM  Result Value Ref Range   Glucose-Capillary 108 (H) 65 - 99 mg/dL  Glucose, capillary     Status: Abnormal   Collection Time: 06/09/15  6:18 PM  Result Value Ref Range   Glucose-Capillary 113 (H) 65 - 99 mg/dL  Glucose, capillary     Status: Abnormal   Collection Time: 06/09/15  9:18 PM  Result Value Ref Range   Glucose-Capillary 193 (H) 65 - 99 mg/dL  Urinalysis, Routine w reflex microscopic (not at [facility])     Status: Abnormal   Collection Time: 06/10/15  3:35 AM  Result Value Ref Range   Color, Urine YELLOW YELLOW   APPearance CLEAR CLEAR   Specific Gravity, Urine 1.014 1.005 - 1.030   pH 5.5 5.0 - 8.0   Glucose, UA NEGATIVE NEGATIVE mg/dL   Hgb urine dipstick SMALL (A) NEGATIVE   Bilirubin Urine NEGATIVE  NEGATIVE   Ketones, ur NEGATIVE NEGATIVE mg/dL   Protein, ur NEGATIVE NEGATIVE mg/dL   Urobilinogen, UA 0.2 0.0 - 1.0 mg/dL   Nitrite NEGATIVE NEGATIVE   Leukocytes, UA NEGATIVE NEGATIVE  Urine microscopic-add on     Status: Abnormal   Collection Time: 06/10/15  3:35 AM  Result Value Ref Range   Squamous Epithelial / LPF FEW (A) RARE   WBC, UA 0-2 <3 WBC/hpf   RBC / HPF 0-2 <3 RBC/hpf   Bacteria, UA FEW (A) RARE  Basic metabolic panel     Status: Abnormal   Collection Time: 06/10/15  6:00 AM  Result Value Ref Range   Sodium 143 135 - 145 mmol/L   Potassium 3.2 (L) 3.5 - 5.1 mmol/L   Chloride 115 (H) 101 - 111 mmol/L   CO2 21 (L) 22 - 32 mmol/L   Glucose, Bld 172 (H) 65 - 99 mg/dL   BUN 94 (H) 6 - 20 mg/dL   Creatinine, Ser 1.92 (H) 0.44 - 1.00 mg/dL   Calcium 8.2 (L) 8.9 - 10.3 mg/dL   GFR calc non Af Amer 25 (L) >60 mL/min   GFR calc Af Amer 29 (L) >60 mL/min    Comment: (NOTE) The eGFR has been calculated using the CKD EPI equation. This calculation has not been validated in all clinical situations. eGFR's persistently <60 mL/min signify possible Chronic Kidney Disease.    Anion gap 7 5 - 15  Phosphorus     Status: Abnormal   Collection Time: 06/10/15  6:00 AM  Result Value Ref Range   Phosphorus 2.0 (L) 2.5 - 4.6 mg/dL  Glucose, capillary     Status: None   Collection Time: 06/10/15  7:29 AM  Result Value Ref Range   Glucose-Capillary 93 65 - 99 mg/dL    Imaging / Studies: US Renal  06/08/2015   CLINICAL DATA:  Elevated creatinine  EXAM: RENAL / URINARY TRACT ULTRASOUND COMPLETE  COMPARISON:  CT from 4 days ago  FINDINGS: Right Kidney:  Length: 10 cm. 12 mm interpolar cyst. Lobulated renal cortex with thinning. No hydronephrosis. No evidence of solid mass.  Left  Kidney:  Length: 10 cm. Lobulated renal cortex with thinning. No hydronephrosis. No evidence of mass.  Bladder:  Appears normal for degree of bladder distention.  IMPRESSION: 1. No hydronephrosis. 2. Renal  atrophy.   Electronically Signed   By: Monte Fantasia M.D.   On: 06/08/2015 21:35    Medications / Allergies:  Scheduled Meds: . enoxaparin (LOVENOX) injection  30 mg Subcutaneous Q24H  . nystatin  5 mL Oral QID  . octreotide  100 mcg Subcutaneous Q12H  . pantoprazole (PROTONIX) IV  40 mg Intravenous QHS  . potassium chloride  10 mEq Intravenous Q1 Hr x 3  . sodium phosphate  Dextrose 5% IVPB  10 mmol Intravenous Once   Continuous Infusions: . Marland KitchenTPN (CLINIMIX-E) Adult     And  . fat emulsion    .  sodium bicarbonate  infusion 1000 mL 75 mL/hr at 06/09/15 1140   PRN Meds:.acetaminophen **OR** acetaminophen, alum & mag hydroxide-simeth, methocarbamol, metoprolol, morphine injection, nitroGLYCERIN, ondansetron **OR** ondansetron (ZOFRAN) IV, sodium chloride  Antibiotics: Anti-infectives    None         Assessment/Plan S/p abdominal wound exploration and explantation of infected mesh 04/28/15 - [place] EC fistula - recurrent -NPO for now. TPN to try to allow fistula to heal. Will consider clears at some point when output is down.  -eakin's pouch to suction  -accurate I&Os.  -pain control  -wound looks good and surrounding skin is healing.  -standostatin restarted 9/26.  Will d/w Dr. Hulen Skains regarding dosage.  PCM-TPN AKI-renal on board.  Improving.  Dispo-SNF when medically stable  Erby Pian, [facility] Pager [phone]) For consults and floor pages call [phone]([id number]:30P)  06/10/2015 8:58 AM

## 2015-06-10 NOTE — Progress Notes (Signed)
[facility]                                                                              Patient Demographics  Marissa [name], is a 74 y.o. female, DOB - 1941/05/15, YDX:[id number]  Admit date - 06/04/2015   Admitting Physician Marissa Mainland, DO  Outpatient Primary MD for the patient is Marissa Calico, MD  LOS - 6   Chief Complaint  Patient presents with  . Abdominal Pain  . Emesis  . Weakness  . Altered Mental Status       Brief HPI   Marissa [name] is a 74 y.o. female With hypertension, hyperlipidemia, GERD, coronary artery disease, stage III chronic kidney disease, history of heart failure, although last echocardiogram on 07/02/14 shows a normal EF of 55-60% and no wall motion abnormalities. The patient also has a complex surgical history: She is status post left hemicolectomy with left transverse colon to sigmoid colon anastomosis for diverticulitis, umbilical hernia repair, repair of incarcerated ventral incisional hernia in 2009, open abdominal hysterectomy in 2013, abdominal wound exploration of enterocutaneous fistula and abscess in 04/28/2015 by Dr.Tsuei. Patient has had an ostomy that as high output. Patient was brought to the emergency department due to increased confusion: History was obtained from her daughter. The patient was unable to recognize her daughter on the morning of admission. She was admitted for further workup.   Assessment & Plan   Principal problem Acute encephalopathy appears to be close to baseline Alert and awake at the time of my evaluation however unable to convey review of systems much, follows all verbal commands.  CT head showed no acute findings.  Active problems Acute renal failure on chronic kidney disease, stage IIlikely from dehydration, possibly due to volume depletion with ischemic ATN, UA negative for any UTI Creatinine improving. Nephrology following, continue IV fluid hydration   Metabolic acidosis:  Bicarbonate  is improving, per nephrology, discontinue bicarbonate drip and gave 1 amp of sodium bicarbonate.   Essential hypertension  - Continue IV Lopressor  H/o chronic anemia, will close monitor, no need of transfusion yet. (h/o transfusion reaction per chart, but daughter denies) - Follow CBC, iron panel consistent with anemia of chronic disease  H/o NIDDM2: last A1c 5.7 in 03/2015, - Continue sliding scale insulin  H/o gout:  - stable, colchicine held due to ARF, consider uloric at discharge  Chronic enterocutaneuos fistula: s/p Eakin pouch, difficult seal - Gen. surgery following, management per wound care and general surgery - Currently npo and TPN.  - sandostatin started on 9/26 per surgery  Hypokalemia Replaced  Adult failure to thrive -We will need SNF or LTACH    Code Status: full code  Family Communication: Discussed in detail with the patient, all imaging results, lab results explained to the patient    Disposition Plan: Will likely need long-term care   Time Spent in minutes  25 minutes  Procedures  Chronic TPN   Consults   Nephrology   general surgery  DVT Prophylaxis  Lovenox   Medications  Scheduled Meds: . enoxaparin (LOVENOX) injection  30 mg Subcutaneous Q24H  . nystatin  5 mL  Oral QID  . octreotide  100 mcg Subcutaneous Q12H  . pantoprazole (PROTONIX) IV  40 mg Intravenous QHS  . potassium chloride  10 mEq Intravenous Q1 Hr x 3  . sodium phosphate  Dextrose 5% IVPB  10 mmol Intravenous Once   Continuous Infusions: . sodium chloride 75 mL/hr at 06/10/15 1119  . Marland KitchenTPN (CLINIMIX-E) Adult     And  . fat emulsion     PRN Meds:.acetaminophen **OR** acetaminophen, alum & mag hydroxide-simeth, methocarbamol, metoprolol, morphine injection, nitroGLYCERIN, ondansetron **OR** ondansetron (ZOFRAN) IV, sodium chloride   Antibiotics   Anti-infectives    None        Subjective:   Marissa [name] was seen and examined today.  Much more alert and awake  today, following verbal commands, denies pain. Otherwise difficult to obtain review of system. No fevers or chills, still has some tachycardia. No acute events overnight.    Objective:   Blood pressure 123/58, pulse 112, temperature 98.3 F (36.8 C), temperature source Oral, resp. rate 18, height 5\' 4"  (1.626 m), weight 105.3 kg (232 lb 2.3 oz), SpO2 100 %.  Wt Readings from Last 3 Encounters:  06/10/15 105.3 kg (232 lb 2.3 oz)  05/01/15 99.3 kg (218 lb 14.7 oz)  04/15/15 99.338 kg (219 lb)     Intake/Output Summary (Last 24 hours) at 06/10/15 1149 Last data filed at 06/10/15 0900  Gross per 24 hour  Intake      0 ml  Output    800 ml  Net   -800 ml    Exam  General: Alert and  awake NAD  HEENT:  PERRLA, EOMI, Anicteric Sclera, mucous membranes moist.   Neck: Supple, no JVD, no masses  CVS: S1 S2 auscultated, no rubs, murmurs or gallops. Regular rate and rhythm.  Respiratory: Clear to auscultation bilaterally, no wheezing, rales or rhonchi  Abdomen: eakin's pouch in place with NGT to suction, nondistended, nontender  Extremities : no cyanosis clubbing or edema  Neuro: alert and awake, able to lift up her legs  Skin: No rashes  Psych: Normal affect and demeanor, alert and oriented x3    Data Review   Micro Results Recent Results (from the past 240 hour(s))  Urine culture     Status: None   Collection Time: 06/04/15 10:39 AM  Result Value Ref Range Status   Specimen Description URINE, CATHETERIZED  Final   Special Requests NONE  Final   Culture NO GROWTH 1 DAY  Final   Report Status 06/05/2015 FINAL  Final  Blood Culture (routine x 2)     Status: None   Collection Time: 06/04/15 10:50 AM  Result Value Ref Range Status   Specimen Description BLOOD PICC LINE  Final   Special Requests BOTTLES DRAWN AEROBIC AND ANAEROBIC 5CC  Final   Culture NO GROWTH 5 DAYS  Final   Report Status 06/09/2015 FINAL  Final  Blood Culture (routine x 2)     Status: None    Collection Time: 06/04/15 11:00 AM  Result Value Ref Range Status   Specimen Description BLOOD RIGHT HAND  Final   Special Requests BOTTLES DRAWN AEROBIC AND ANAEROBIC 5CC  Final   Culture NO GROWTH 5 DAYS  Final   Report Status 06/09/2015 FINAL  Final    Radiology Reports Ct Abdomen Pelvis Wo Contrast  06/04/2015   CLINICAL DATA:  74 year old female with acute right abdominal and flank pain, nausea and vomiting for 1 week.  EXAM: CT ABDOMEN AND PELVIS WITHOUT  CONTRAST  TECHNIQUE: Multidetector CT imaging of the abdomen and pelvis was performed following the standard protocol without IV contrast.  COMPARISON:  05/06/2015 and prior CTs  FINDINGS: Please note that parenchymal abnormalities may be missed without intravenous contrast.  Lower chest:  Coronary artery calcifications again identified.  Hepatobiliary: The liver is unremarkable. Patient is status post cholecystectomy. There is no evidence biliary dilatation.  Pancreas: Unremarkable  Spleen: Unremarkable  Adrenals/Urinary Tract: Mild bilateral renal atrophy again identified. The kidneys, adrenal glands and bladder are otherwise unremarkable. There is no evidence of hydronephrosis or urinary calculi.  Stomach/Bowel: A small hiatal hernia is again noted. There is no evidence of bowel obstruction or focal bowel wall thickening. Descending and sigmoid colonic diverticulosis noted without evidence of diverticulitis.  Vascular/Lymphatic: Aortic atherosclerotic calcifications noted without aneurysm. No enlarged lymph nodes are identified.  Reproductive: The patient is status post hysterectomy. The adnexal regions are unremarkable.  Other: No free fluid or pneumoperitoneum. Periumbilical surgical changes are identified. There is no evidence of ventral hernia.  Musculoskeletal: No acute abnormalities identified. Multilevel mild degenerative disc disease and moderate facet arthropathy in the lumbar spine noted.  IMPRESSION: No evidence of acute abnormality. No  evidence of urinary calculi or hydronephrosis.  Small hiatal hernia and mild bilateral renal atrophy again noted.  Multilevel degenerative changes within the lumbar spine.  Coronary artery disease and aortic atherosclerosis.   Electronically Signed   By: Margarette Canada M.D.   On: 06/04/2015 12:09   Ct Head Wo Contrast  06/06/2015   CLINICAL DATA:  Abnormal speech.  EXAM: CT HEAD WITHOUT CONTRAST  TECHNIQUE: Contiguous axial images were obtained from the base of the skull through the vertex without intravenous contrast.  COMPARISON:  12/28/2014  FINDINGS: Mild generalized cerebral atrophy is normal for age. There is no evidence of acute cortical infarct, intracranial hemorrhage, mass, midline shift, or extra-axial fluid collection. Periventricular white-matter hypodensities are unchanged and nonspecific but compatible with mild chronic small vessel ischemic disease.  Orbits are unremarkable. Visualized paranasal sinuses and mastoid air cells are clear. Mild atherosclerotic calcification is noted at the skullbase.  IMPRESSION: No evidence of acute intracranial abnormality.   Electronically Signed   By: Logan Bores M.D.   On: 06/06/2015 13:22   US Renal  06/08/2015   CLINICAL DATA:  Elevated creatinine  EXAM: RENAL / URINARY TRACT ULTRASOUND COMPLETE  COMPARISON:  CT from 4 days ago  FINDINGS: Right Kidney:  Length: 10 cm. 12 mm interpolar cyst. Lobulated renal cortex with thinning. No hydronephrosis. No evidence of solid mass.  Left Kidney:  Length: 10 cm. Lobulated renal cortex with thinning. No hydronephrosis. No evidence of mass.  Bladder:  Appears normal for degree of bladder distention.  IMPRESSION: 1. No hydronephrosis. 2. Renal atrophy.   Electronically Signed   By: Monte Fantasia M.D.   On: 06/08/2015 21:35   Dg Chest Port 1 View  06/04/2015   CLINICAL DATA:  Sepsis.  Low-grade fever.  EXAM: PORTABLE CHEST 1 VIEW  COMPARISON:  04/27/2015  FINDINGS: Right PICC line tip:  SVC.  Atherosclerotic aortic  arch. Thoracic spondylosis. Upper normal heart size.  The lungs appear clear.  IMPRESSION: 1. Atherosclerosis. 2. Right PICC line tip:  SVC.   Electronically Signed   By: Van Clines M.D.   On: 06/04/2015 10:55    CBC  Recent Labs Lab 06/04/15 1050 06/04/15 1113 06/05/15 0500 06/08/15 0553  WBC 11.9*  --  8.1 10.2  HGB 8.9* 9.5* 7.8* 7.2*  HCT 27.6* 28.0* 24.2* 22.9*  PLT 271  --  239 189  MCV 71.1*  --  70.3* 71.1*  MCH 22.9*  --  22.7* 22.4*  MCHC 32.2  --  32.2 31.4  RDW 16.6*  --  16.9* 17.2*  LYMPHSABS 0.9  --  1.1 0.7  MONOABS 1.3*  --  1.0 1.1*  EOSABS 0.0  --  0.1 0.2  BASOSABS 0.0  --  0.0 0.0    Chemistries   Recent Labs Lab 06/04/15 1050  06/05/15 0500 06/06/15 0522 06/07/15 0614 06/08/15 0553 06/09/15 0600 06/10/15 0600  NA 138  < > 141 144 144 140 145 143  K 5.8*  < > 5.0 4.7 4.7 4.0 3.6 3.2*  CL 120*  < > 128* 129* 130* 125* 119* 115*  CO2 6*  --  9* 9* 9* 8* 17* 21*  GLUCOSE 111*  < > 114* 111* 116* 151* 148* 172*  BUN 130*  < > 105* 98* 107* 122* 112* 94*  CREATININE 2.13*  < > 2.03* 1.97* 2.01* 2.50* 2.19* 1.92*  CALCIUM 10.4*  --  9.6 9.5 9.4 9.0 8.8* 8.2*  MG  --   --  2.7* 2.0  --  1.6* 2.0  --   AST 54*  --  35  --   --  29  --   --   ALT 108*  --  84*  --   --  58*  --   --   ALKPHOS 114  --  107  --   --  104  --   --   BILITOT 0.2*  --  0.5  --   --  0.3  --   --   < > = values in this interval not displayed. ------------------------------------------------------------------------------------------------------------------ estimated creatinine clearance is 30.4 mL/min (by C-G formula based on Cr of 1.92). ------------------------------------------------------------------------------------------------------------------ No results for input(s): HGBA1C in the last 72 hours. ------------------------------------------------------------------------------------------------------------------  Recent Labs  06/08/15 0553  TRIG 47    ------------------------------------------------------------------------------------------------------------------ No results for input(s): TSH, T4TOTAL, T3FREE, THYROIDAB in the last 72 hours.  Invalid input(s): FREET3 ------------------------------------------------------------------------------------------------------------------  Recent Labs  06/08/15 0553 06/09/15 0600  VITAMINB12 924*  --   TIBC  --  174*  IRON  --  52    Coagulation profile No results for input(s): INR, PROTIME in the last 168 hours.  No results for input(s): DDIMER in the last 72 hours.  Cardiac Enzymes No results for input(s): CKMB, TROPONINI, MYOGLOBIN in the last 168 hours.  Invalid input(s): CK ------------------------------------------------------------------------------------------------------------------ Invalid input(s): Duncan  06/08/15 1653 06/08/15 2055 06/09/15 0749 06/09/15 1818 06/09/15 2118 06/10/15 0729  GLUCAP 123* 207* 108* 113* 193* 15     Amaiya Scruton M.D. [facility] 06/10/2015, 11:49 AM  Pager: [phone] Between 7am to 7pm - call Pager - [phone]  After 7pm go to [email or web address] - password [email or web address]  Call night coverage person covering after 7pm

## 2015-06-10 NOTE — Progress Notes (Signed)
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  No Known Allergies  Patient Measurements: Height: 5\' 4"  (162.6 cm) Weight: 232 lb 2.3 oz (105.3 kg) IBW/kg (Calculated) : 54.7  Vital Signs: Temp: 97.8 F (36.6 C) (09/28 0420) Temp Source: Oral (09/28 0420) BP: 137/63 mmHg (09/28 0420) Pulse Rate: 120 (09/28 0420) Intake/Output from previous day: 09/27 0701 - 09/28 0700 In: 0  Out: 800 [Drains:800]  Labs:  Recent Labs  06/08/15 0553  WBC 10.2  HGB 7.2*  HCT 22.9*  PLT 189     Recent Labs  06/08/15 0553 06/09/15 0600 06/10/15 0600  NA 140 145 143  K 4.0 3.6 3.2*  CL 125* 119* 115*  CO2 8* 17* 21*  GLUCOSE 151* 148* 172*  BUN 122* 112* 94*  CREATININE 2.50* 2.19* 1.92*  CALCIUM 9.0 8.8* 8.2*  MG 1.6* 2.0  --   PHOS 2.8 4.0 2.0*  PROT 6.3*  --   --   ALBUMIN 2.2* 2.1*  --   AST 29  --   --   ALT 58*  --   --   ALKPHOS 104  --   --   BILITOT 0.3  --   --   PREALBUMIN 23.8  --   --   TRIG 47  --   --    Estimated Creatinine Clearance: 30.4 mL/min (by C-G formula based on Cr of 1.92).    Recent Labs  06/09/15 1818 06/09/15 2118 06/10/15 0729  GLUCAP 113* 193* 93       Insulin Requirements in the past 24 hours:  SSI d/c'ed 06/08/15  Assessment: 31 YOF with an extensive surgical history to continue on TPN for EC fistula. CT on admit shows no acute abnormality.  Admit: AMS, abd pain Surgeries/Procedures: hx left hemicolectomy with left transverse colon to sigmoid colon anastomosis for diverticulitis, umbilical hernia repair, repair of incarcerated ventral incisional hernia in 2009, open abd hysterectomy in 2013, abdominal wound exploration of EC fistula and abscess in 04/28/2015  GI: GERD / esophagitis / diverticulitis / occasional bowel incontinence / high output EC fistula. Fistula with bilious output. NPO to allow fistula to heal; prealbumin WNL at 23.8.  Eakin's pouch to  suction with 18mL out per NP, greenish yellow output. Octreotide started 9/26, PPI IV, +BM Endo: gout (colchicine held d/t AKI, MD considering Uloric). DM2 - no insulin in home TPN. CBGs have been fairly controlled (slightly elevated while on max TPN rate, and low normal while off).  Concern that adding insulin to control the elevated value will lead to hypoglycemia when off TPN.   Lytes: K+ 3.2 (3 runs per MD), CL/CO2 improving, CoCa WNL at 9.72, Phos low at 2 (NaPhos 10 mmol ordered).  No lytes in TPN on 9/23-9/25, 9/27 Renal: CKD3 - SCr improved to 1.92 (baseline 1.23, early Sept was 1.45), BUN improved to 94, urinated x6 - bicarb gtt at 75 ml/hr Pulm: OSA on CPAP - stable on RA Cards: HTN / HLD / CAD / CHF (EF 55-60%) - BP controlled, tachy Hepatobil: AST/ALT improving, tbili WNL.  TG WNL. Best Practices: Lovenox TPN Access: PICC line placed 04/27/15 TPN start date: 9/23 >>  Current Nutrition:  Cyclic TPN at home (last 3/61)  Home TPN: Cyclic compounded 3:1 TPN with electrolytes over 12 hours at home = 1900 kCal and 90g protein per day No insulin  Nutrition Goals: 1700-1900 kCal and 100-110 gm protein per day  Plan:  - Clinimix E 5/15 (add back electrolytes), infuse 1846mL over 12 hrs:  50 ml/hr x 1 hr, then 170 ml/hr x 10 hrs, then 50 ml/hr x 1 hr.  Will likely need to alternate one day with followed by a day without electrolytes. - IVFE at 20 ml/hr x 12 hrs - TPN provides 1800 kCal and 90 gm protein daily, meeting 100% of kCal and 90% of minimal protein needs.  Goal protein for CRF is ~0.8 g/kg/d, currently providing 0.88 g/kg/d. - Daily multivitamin and trace elements - Bicarb gtt at 75 ml/hr per MD - F/U standard TPN labs in AM    Thuy D. Mina Marble, PharmD, BCPS Pager:  [phone] 06/10/2015, 8:36 AM

## 2015-06-10 NOTE — Consult Note (Signed)
WOC wound follow up Current fistula pouch is leaking onto denuded lower abd skin. It will be difficult to maintain the seal for more than 24 hours until denuded weeping areas have resolved. Cleansed all the skin and cut a small Eakin pouch to fit the perimeter of the wound, she has one fistula that is actively draining green effluent at 12 o'clock.Wound is full thickness; 100% beefy red with mod amt green drainage, no odor. 6.5X4.5X.3cm, bleeds easily when touched. Lower abd with partial thickness skin loss, red and moist with small amt in patchy areas, small amt bleeding.  Built up the midline wound just distal to the edge of the perimeter of the Eakin pouch with several strips of eakin barrier to create a dam. Placed pouch to [facility] with suction catheter to allow for the pouch to stay continuously drained and allow the skin some time to heal while she is in the hospital. Mod amt green drainage in cannister. Supplies at bedside and instructions provided for bedside nurses. Julien Girt MSN, RN, [place], [place], [place]

## 2015-06-10 NOTE — Progress Notes (Signed)
Physical Therapy Treatment Patient Details Name: Tamarah Bhullar MRN: [id number] DOB: 24-Nov-1940 Today's Date: 06/10/2015    History of Present Illness Patient is a 74 yo female admitted 06/04/15 with AMS, abdominal pain, dehydration, weakness, abdominal wounds.  PMH:  HTN, arthritis, CAD, DM, neuropathy, CHF, CKD, ostomy    PT Comments    Bil foot and ankle pain limiting participation today;   Given noted TNA, should consider LTACH for dc plan as well   Follow Up Recommendations  LTACH;Other (comment) (worth considering LTACH versus SNF)     Equipment Recommendations  None recommended by PT    Recommendations for Other Services       Precautions / Restrictions Precautions Precautions: Fall Precaution Comments: drain from abdominal wound    Mobility  Bed Mobility Overal bed mobility: Needs Assistance;+2 for physical assistance Bed Mobility: Supine to Sit Rolling: Total assist;+2 for physical assistance   Supine to sit: Total assist;+2 for physical assistance     General bed mobility comments: Pt not assisting with bed mobility, used bed pad under her to guide hips and feet to EOB with rolling and to cradle hips to square off at EOB; Total assist to returrn to supine; pt stated loudly, "Dont' do that!" as we moved her towards EOB  Transfers                 General transfer comment: Clearly stating she would rather not get up today  Ambulation/Gait                 Stairs            Wheelchair Mobility    Modified Rankin (Stroke Patients Only)       Balance   Sitting-balance support: Bilateral upper extremity supported;Feet supported Sitting balance-Leahy Scale: Fair Sitting balance - Comments: Sat EOB approx 5 minutes; declined participating in functional activity while sitting up; did not need external support for sitting balance once EOB                            Cognition Arousal/Alertness: Awake/alert Behavior During  Therapy: Flat affect Overall Cognitive Status: Difficult to assess                      Exercises      General Comments        Pertinent Vitals/Pain Pain Assessment: Faces Faces Pain Scale: Hurts whole lot Pain Location: Bil feet; Grmiace while donning socks; attributes it to gout Pain Descriptors / Indicators: Aching;Sharp Pain Intervention(s): Monitored during session;Repositioned    Home Living                      Prior Function            PT Goals (current goals can now be found in the care plan section) Acute Rehab PT Goals Patient Stated Goal: Unable to state PT Goal Formulation: Patient unable to participate in goal setting Time For Goal Achievement: 06/20/15 Potential to Achieve Goals: Fair Progress towards PT goals: Not progressing toward goals - comment (incr gout pain and decr participation)    Frequency  Min 2X/week    PT Plan Discharge plan needs to be updated    Co-evaluation             End of Session   Activity Tolerance: Other (comment);Patient limited by pain (Gouty pain bil feet precluded pivot transfer) Patient left: in  bed;with call bell/phone within reach;with bed alarm set     Time: 7998-7215 PT Time Calculation (min) (ACUTE ONLY): 16 min  Charges:  $Therapeutic Activity: 8-22 mins                    G Codes:      Roney Marion [facility] 06/10/2015, 11:39 AM  Roney Marion, PT  Acute Rehabilitation Services Pager [phone] Office [phone]

## 2015-06-10 NOTE — Progress Notes (Signed)
[facility] Progress Note    Assessment/ Plan:   1. AKI on CKD Stage III: Likely due to volume depletion with ischemic ATN component. Cr has improved from 2.5>2.19>1.92 today after receiving NS at 100 ml/hr for last 12 hours. Not quite back to baseline but hopefully will continue to see improvement with IVF. Will decrease rate of NS to 75 ml/hr since getting some volume on board. UOP not being recorded.  2. Acute Encephalopathy: At baseline. More talkative today.  3. 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 Currently being managed by wound care and general surgery.  4. Metabolic Acidosis: Improved. Bicarb increasing from 17>21. Will discontinue bicarb drip and give 1 amp of sodium bicarb.  5. Microcytic Anemia: Hgb 8.9 on admission, currently 7.2. MCV 70-71. Iron normal at 52, TIBC low at 174, and sat 30%. Likely secondary to anemia of chronic disease.  Transfuse for Hgb < 7.0.  6. Hypophosphatemia: Ph 2.0 from 4.0. Will give sodium phosphate 10 mmol.  7. Hypokalemia: K 3.2 this AM, down from 3.6. She received 3 runs of KCl 10 mEq yesterday. Will give another 3 runs.   Subjective:   No acute events overnight. Patient seems more alert as hydration continues.    Objective:   BP 137/63 mmHg  Pulse 120  Temp(Src) 97.8 F (36.6 C) (Oral)  Resp 18  Ht 5\' 4"  (1.626 m)  Wt 232 lb 2.3 oz (105.3 kg)  BMI 39.83 kg/m2  SpO2 100%  Intake/Output Summary (Last 24 hours) at 06/10/15 0742 Last data filed at 06/10/15 0655  Gross per 24 hour  Intake      0 ml  Output    800 ml  Net   -800 ml   Weight change:   Physical Exam: Gen: resting in bed, NAD CVS: RRR, no m/g/r Resp: minimal crackles at bases Abd: BS+, soft, non-tender. Eakin pouch in mid-abdomen.  Ext: warm, no peripheral edema   Imaging: US Renal  06/08/2015   CLINICAL DATA:  Elevated creatinine  EXAM: RENAL / URINARY TRACT ULTRASOUND COMPLETE  COMPARISON:  CT from 4 days ago  FINDINGS: Right  Kidney:  Length: 10 cm. 12 mm interpolar cyst. Lobulated renal cortex with thinning. No hydronephrosis. No evidence of solid mass.  Left Kidney:  Length: 10 cm. Lobulated renal cortex with thinning. No hydronephrosis. No evidence of mass.  Bladder:  Appears normal for degree of bladder distention.  IMPRESSION: 1. No hydronephrosis. 2. Renal atrophy.   Electronically Signed   By: Monte Fantasia M.D.   On: 06/08/2015 21:35    Labs: BMET  Recent Labs Lab 06/04/15 1050 06/04/15 1113 06/05/15 0500 06/06/15 0522 06/07/15 0614 06/08/15 0553 06/09/15 0600 06/10/15 0600  NA 138 141 141 144 144 140 145 143  K 5.8* 5.7* 5.0 4.7 4.7 4.0 3.6 3.2*  CL 120* 123* 128* 129* 130* 125* 119* 115*  CO2 6*  --  9* 9* 9* 8* 17* 21*  GLUCOSE 111* 112* 114* 111* 116* 151* 148* 172*  BUN 130* 133* 105* 98* 107* 122* 112* 94*  CREATININE 2.13* 2.20* 2.03* 1.97* 2.01* 2.50* 2.19* 1.92*  CALCIUM 10.4*  --  9.6 9.5 9.4 9.0 8.8* 8.2*  PHOS  --   --  6.2* 3.6 3.2 2.8 4.0 2.0*   CBC  Recent Labs Lab 06/04/15 1050 06/04/15 1113 06/05/15 0500 06/08/15 0553  WBC 11.9*  --  8.1 10.2  NEUTROABS 9.2*  --  5.9 8.2*  HGB 8.9* 9.5* 7.8* 7.2*  HCT 27.6* 28.0* 24.2* 22.9*  MCV 71.1*  --  70.3* 71.1*  PLT 271  --  239 189    Medications:    . enoxaparin (LOVENOX) injection  30 mg Subcutaneous Q24H  . nystatin  5 mL Oral QID  . octreotide  100 mcg Subcutaneous Q12H  . pantoprazole (PROTONIX) IV  40 mg Intravenous QHS      Albin Felling, MD, MPH Internal Medicine Resident, PGY-II Pager: [phone]    06/10/2015, 7:42 AM

## 2015-06-11 LAB — COMPREHENSIVE METABOLIC PANEL
ALK PHOS: 106 U/L (ref 38–126)
ALT: 50 U/L (ref 14–54)
AST: 36 U/L (ref 15–41)
Albumin: 1.9 g/dL — ABNORMAL LOW (ref 3.5–5.0)
Anion gap: 7 (ref 5–15)
BUN: 79 mg/dL — ABNORMAL HIGH (ref 6–20)
CALCIUM: 8.1 mg/dL — AB (ref 8.9–10.3)
CHLORIDE: 114 mmol/L — AB (ref 101–111)
CO2: 23 mmol/L (ref 22–32)
CREATININE: 1.75 mg/dL — AB (ref 0.44–1.00)
GFR, EST AFRICAN AMERICAN: 32 mL/min — AB (ref >60)
GFR, EST NON AFRICAN AMERICAN: 28 mL/min — AB (ref >60)
Glucose, Bld: 133 mg/dL — ABNORMAL HIGH (ref 65–99)
Potassium: 3.6 mmol/L (ref 3.5–5.1)
Sodium: 144 mmol/L (ref 135–145)
Total Bilirubin: 0.3 mg/dL (ref 0.3–1.2)
Total Protein: 5.5 g/dL — ABNORMAL LOW (ref 6.5–8.1)

## 2015-06-11 LAB — PHOSPHORUS: Phosphorus: 3.7 mg/dL (ref 2.5–4.6)

## 2015-06-11 LAB — MAGNESIUM: MAGNESIUM: 1.9 mg/dL (ref 1.7–2.4)

## 2015-06-11 LAB — GLUCOSE, CAPILLARY
GLUCOSE-CAPILLARY: 143 mg/dL — AB (ref 65–99)
GLUCOSE-CAPILLARY: 105 mg/dL — AB (ref 65–99)
GLUCOSE-CAPILLARY: 167 mg/dL — AB (ref 65–99)
Glucose-Capillary: 168 mg/dL — ABNORMAL HIGH (ref 65–99)
Glucose-Capillary: 90 mg/dL (ref 65–99)

## 2015-06-11 MED ORDER — TRACE MINERALS CR-CU-MN-SE-ZN 10-1000-500-60 MCG/ML IV SOLN
INTRAVENOUS | Status: AC
  Administered 2015-06-11: 18:00:00 via INTRAVENOUS
  Filled 2015-06-11: qty 1800

## 2015-06-11 MED ORDER — FAT EMULSION 20 % IV EMUL
240.0000 mL | INTRAVENOUS | Status: AC
  Administered 2015-06-11: 240 mL via INTRAVENOUS
  Filled 2015-06-11: qty 250

## 2015-06-11 MED ORDER — INSULIN ASPART 100 UNIT/ML ~~LOC~~ SOLN
0.0000 [IU] | SUBCUTANEOUS | Status: DC
  Administered 2015-06-11: 1 [IU] via SUBCUTANEOUS
  Administered 2015-06-11: 2 [IU] via SUBCUTANEOUS
  Administered 2015-06-12: 1 [IU] via SUBCUTANEOUS
  Administered 2015-06-12 – 2015-06-14 (×5): 2 [IU] via SUBCUTANEOUS
  Administered 2015-06-14: 1 [IU] via SUBCUTANEOUS
  Administered 2015-06-14: 2 [IU] via SUBCUTANEOUS
  Administered 2015-06-14: 1 [IU] via SUBCUTANEOUS
  Administered 2015-06-15 (×2): 2 [IU] via SUBCUTANEOUS

## 2015-06-11 NOTE — Progress Notes (Signed)
Nutrition Follow Up   DOCUMENTATION CODES:   Obesity unspecified  INTERVENTION:    Continue cyclic TPN per Pharmacy to meet 100% of calorie needs and 88% of minimum protein needs.   Diet advancement as able per physician.   NUTRITION DIAGNOSIS:   Inadequate oral intake related to altered GI function, inability to eat as evidenced by NPO status, ongoing   GOAL:   Patient will meet greater than or equal to 90% of their needs, met  MONITOR:   Labs, Weight trends, Skin, Diet advancement  ASSESSMENT:   74 year old female with hypertension, hyperlipidemia, GERD, coronary artery disease, stage III chronic kidney disease, and heart failure. The patient also has a complex surgical history. She is status post left hemicolectomy with left transverse colon to sigmoid colon anastomosis for diverticulitis, umbilical hernia repair, repair of incarcerated ventral incisional hernia in 2009, open abdominal hysterectomy in 2013, abdominal wound exploration of enterocutaneous fistula and abscess in 04/28/2015 by Dr.Tsuei. Patient has had an ostomy that as high output. Patient was brought to the emergency department due to increased confusion:  Pt continues to be NPO.  S/p swallow evaluation 9/24.  Pt with chronic enterocutaneuos fistula.  Eakin pouch to suction.    CWOCN note reviewed 9/26.  Surrounding skin to wound healing.  Patient receives cyclic compounded 3:1 TPN over 12 hours at home which provides ~1900 kcal and 90g protein which also contains electrolytes.  While in the hospital, to receive cyclic TPN with Clinimix E 5/15 1800 ml and IVFE 20% 240 ml, both to run over 12 hours.  Will provide 1800 kcals and 90 gm protein per day.  Will meet 100% minimum estimated energy needs and 90% minimum estimated protein needs.  Diet Order:  Diet NPO time specified Except for: Ice Chips TPN (CLINIMIX-E) Adult  Skin:  Wound (see comment) (abdominal incision/fistula)  Last BM:  9/28  Height:   Ht  Readings from Last 1 Encounters:  06/04/15 _0  (1.626 m)    Weight:   Wt Readings from Last 1 Encounters:  06/10/15 232 lb 2.3 oz (105.3 kg)    Ideal Body Weight:  54.5 kg  BMI:  Body mass index is 39.83 kg/(m^2).  Estimated Nutritional Needs:   Kcal:  1700-1900  Protein:  100-110 gm  Fluid:  1.8-2 L  EDUCATION NEEDS:   No education needs identified at this time  Arthur Holms, RD, LDN Pager #: [phone] After-Hours Pager #: [phone]

## 2015-06-11 NOTE — Progress Notes (Signed)
  [facility] Progress Note    Assessment/ Plan:   1. AKI on CKD Stage III: Likely due to volume depletion with ischemic ATN component. Cr continues to improve, 2.19>1.92>1.75. UOP 1650 ml over past 24 hours. Will stop fluids as patient to start TPN today. Expect to see Cr continue to improve while getting nutrition with TPN.  2. Acute Encephalopathy: At baseline. More talkative today.  3. 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 Currently being managed by wound care and general surgery.  4. Metabolic Acidosis: Resolved. Bicarb 23 from 21.  5. Microcytic Anemia: Hgb 8.9 on admission, currently 7.2. MCV 70-71. Iron normal at 52, TIBC low at 174, and sat 30%. Likely secondary to anemia of chronic disease.  Transfuse for Hgb < 7.0.  6. Hypophosphatemia: Ph 3.7 from 2.0 after getting sodium phosphate 10 mmol yesterday. Continue to monitor.  7. Hypokalemia: K 3.6 from 3.2. Received 3 runs of KCl 10 mEq yesterday. Continue to monitor.   We will sign off at this time. Please let us know if we can help in anyway.  Subjective:   No acute events overnight. Patient more alert, talkative this morning. Complains of abdominal pain.     Objective:   BP 135/59 mmHg  Pulse 103  Temp(Src) 98.6 F (37 C) (Oral)  Resp 18  Ht 5\' 4"  (1.626 m)  Wt 232 lb 2.3 oz (105.3 kg)  BMI 39.83 kg/m2  SpO2 100%  Intake/Output Summary (Last 24 hours) at 06/11/15 0740 Last data filed at 06/11/15 0500  Gross per 24 hour  Intake 754.58 ml  Output   2100 ml  Net -1345.42 ml   Weight change:   Physical Exam: Gen: resting in bed, NAD CVS: RRR, no m/g/r Resp: minimal crackles at bases Abd: BS+, soft, non-tender. Eakin pouch in mid-abdomen.  Ext: warm, trace peripheral edema   Imaging: No results found.  Labs: BMET  Recent Labs Lab 06/05/15 0500 06/06/15 0522 06/07/15 0614 06/08/15 0553 06/09/15 0600 06/10/15 0600 06/11/15 0558  NA 141 144 144 140 145 143 144  K 5.0  4.7 4.7 4.0 3.6 3.2* 3.6  CL 128* 129* 130* 125* 119* 115* 114*  CO2 9* 9* 9* 8* 17* 21* 23  GLUCOSE 114* 111* 116* 151* 148* 172* 133*  BUN 105* 98* 107* 122* 112* 94* 79*  CREATININE 2.03* 1.97* 2.01* 2.50* 2.19* 1.92* 1.75*  CALCIUM 9.6 9.5 9.4 9.0 8.8* 8.2* 8.1*  PHOS 6.2* 3.6 3.2 2.8 4.0 2.0* 3.7   CBC  Recent Labs Lab 06/04/15 1050 06/04/15 1113 06/05/15 0500 06/08/15 0553  WBC 11.9*  --  8.1 10.2  NEUTROABS 9.2*  --  5.9 8.2*  HGB 8.9* 9.5* 7.8* 7.2*  HCT 27.6* 28.0* 24.2* 22.9*  MCV 71.1*  --  70.3* 71.1*  PLT 271  --  239 189    Medications:    . enoxaparin (LOVENOX) injection  40 mg Subcutaneous Q24H  . nystatin  5 mL Oral QID  . octreotide  100 mcg Subcutaneous Q12H  . pantoprazole (PROTONIX) IV  40 mg Intravenous QHS      Albin Felling, MD, MPH Internal Medicine Resident, PGY-II Pager: [phone]    06/11/2015, 7:40 AM

## 2015-06-11 NOTE — Care Management Note (Signed)
Case Management Note  Patient Details  Name: Marissa [name] MRN: [id number] Date of Birth: 04-12-1941  Subjective/Objective:          CM following for progression and d/c planning.          Action/Plan: Referrral made to [facility] to review this pt for possible adm due to complete wound care and acute needs. Await review.   Expected Discharge Date:       06/15/2015           Expected Discharge Plan:  Long Term Acute Care (LTAC)  In-House Referral:  Clinical Social Work  Discharge planning Services  NA  Post Acute Care Choice:  NA Choice offered to:  NA  DME Arranged:    DME Agency:     HH Arranged:    [place] Agency:     Status of Service:  Completed, signed off  Medicare Important Message Given:  Yes-third notification given Date Medicare IM Given:    Medicare IM give by:    Date Additional Medicare IM Given:    Additional Medicare Important Message give by:     If discussed at [place] of Stay Meetings, dates discussed:    Additional Comments:  Adron Bene, RN 06/11/2015, 2:22 PM

## 2015-06-11 NOTE — Progress Notes (Signed)
[facility] Progress Note    Subjective: Patient about the same.  No more pain.  Fistula output down.  Objective: Vital signs in last 24 hours: Temp:  [98.1 F (36.7 C)-98.6 F (37 C)] 98.6 F (37 C) (09/29 0436) Pulse Rate:  [99-112] 103 (09/29 0436) Resp:  [18-19] 18 (09/29 0436) BP: (123-146)/(58-63) 135/59 mmHg (09/29 0436) SpO2:  [100 %] 100 % (09/29 0436) Last BM Date: 06/10/15  Intake/Output from previous day: 09/28 0701 - 09/29 0700 In: 754.6 [I.V.:501.3; IV Piggyback:253.3] Out: 2100 [Urine:1750; Drains:350] Intake/Output this shift:    General: Slow to respond, but will respond.  Say she is aobut the same.  Lungs: Clear  Abd: Fistula output controlled, and total amount of output down.  Extremities: No changes  Neuro: Intact  Lab Results:  @LABLAST2 (wbc:2,hgb:2,hct:2,plt:2) BMET ) Recent Labs  06/10/15 0600 06/11/15 0558  NA 143 144  K 3.2* 3.6  CL 115* 114*  CO2 21* 23  GLUCOSE 172* 133*  BUN 94* 79*  CREATININE 1.92* 1.75*  CALCIUM 8.2* 8.1*   PT/INR No results for input(s): LABPROT, INR in the last 72 hours. ABG No results for input(s): PHART, HCO3 in the last 72 hours.  Invalid input(s): PCO2, PO2  Studies/Results: No results found.  Anti-infectives: Anti-infectives    None      Assessment/Plan: s/p  Continue TPN and fistula control  At some point in the futre she may require surgery or this may close without surgery.  LOS: 7 days   Kathryne Eriksson. Dahlia Bailiff, MD, FACS [phone] [facility] 06/11/2015

## 2015-06-11 NOTE — Progress Notes (Signed)
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  No Known Allergies  Patient Measurements: Height: 5\' 4"  (162.6 cm) Weight: 232 lb 2.3 oz (105.3 kg) IBW/kg (Calculated) : 54.7  Vital Signs: Temp: 98.6 F (37 C) (09/29 0436) Temp Source: Oral (09/29 0436) BP: 135/59 mmHg (09/29 0436) Pulse Rate: 103 (09/29 0436) Intake/Output from previous day: 09/28 0701 - 09/29 0700 In: 754.6 [I.V.:501.3; IV Piggyback:253.3] Out: 2100 [Urine:1750; Drains:350]  Labs: No results for input(s): WBC, HGB, HCT, PLT, APTT, INR in the last 72 hours.   Recent Labs  06/09/15 0600 06/10/15 0600 06/11/15 0558  NA 145 143 144  K 3.6 3.2* 3.6  CL 119* 115* 114*  CO2 17* 21* 23  GLUCOSE 148* 172* 133*  BUN 112* 94* 79*  CREATININE 2.19* 1.92* 1.75*  CALCIUM 8.8* 8.2* 8.1*  MG 2.0  --  1.9  PHOS 4.0 2.0* 3.7  PROT  --   --  5.5*  ALBUMIN 2.1*  --  1.9*  AST  --   --  36  ALT  --   --  50  ALKPHOS  --   --  106  BILITOT  --   --  0.3   Estimated Creatinine Clearance: 33.3 mL/min (by C-G formula based on Cr of 1.75).    Recent Labs  06/10/15 0729 06/10/15 1950 06/11/15 0442  GLUCAP 93 193* 168*    Insulin Requirements in the past 24 hours:  SSI d/c'ed 06/08/15  Assessment: 48 YOF with an extensive surgical history to continue on TPN for EC fistula. CT on admit shows no acute abnormality.  Admit: AMS, abd pain  Surgeries/Procedures: hx left hemicolectomy with left transverse colon to sigmoid colon anastomosis for diverticulitis, umbilical hernia repair, repair of incarcerated ventral incisional hernia in 2009, open abd hysterectomy in 2013, abdominal wound exploration of EC fistula and abscess in 04/28/2015  GI: GERD / esophagitis / diverticulitis / occasional bowel incontinence / high output EC fistula. Fistula with bilious output. NPO to allow fistula to heal; prealbumin WNL at 23.8.  Eakin's pouch to suction  with 312mL out. Octreotide started 9/26, PPI IV, +BM 9/28 Endo: gout (colchicine held d/t AKI, MD considering Uloric). DM2 - no insulin in home TPN. CBGs low-nl (90-110) when TPN off but elevated (150-200) when on TPN. Will add sensitive SSI back. Will not add insulin to TPN due to concern that adding insulin to control the elevated value will likely lead to hypoglycemia when off TPN.   Lytes: K+ 3.6, CoCa WNL at 9.6, Phos back to wnl s/p replacement yesterday. LYTES IN TPN Renal: CKD3 - SCr improved to 1.75 (baseline 1.23, early Sept was 1.45), BUN improved to 79, UOP 0.7 ml/kg/hr. bicarb gtt d/c Pulm: OSA on CPAP - stable on RA Cards: HTN / HLD / CAD / CHF (EF 55-60%) - BP controlled, tachy Hepatobil: LFTs wnl. TG WNL. Best Practices: Lovenox TPN Access: PICC line placed 04/27/15 TPN start date: Chronic  Current Nutrition:  Cyclic TPN at home (last 4/09)  Home TPN: Cyclic compounded 3:1 TPN with electrolytes over 12 hours at home = 1900 kCal and 90g protein per day No insulin  Nutrition Goals: 1700-1900 kCal and 100-110 gm protein per day  Plan:  - Continue Clinimix E 5/15 (with electrolytes), infuse 1834mL over 12 hrs:  50 ml/hr x 1 hr, then 170 ml/hr x 10 hrs, then 50 ml/hr x 1 hr.  May need to  alternate between electrolytes and no electrolytes. - IVFE at 20 ml/hr x 12 hrs - TPN provides 1800 kCal and 90 gm protein daily, meeting 100% of kCal and 90% of minimal protein needs.  Goal protein for CRF is ~0.8 g/kg/d, currently providing 0.88 g/kg/d. - Will start sensitive SSI q4h - Daily multivitamin and trace elements - F/U BMET and Phos in a.m.  Sherlon Handing, PharmD, BCPS Clinical pharmacist, pager [phone] 06/11/2015, 7:51 AM

## 2015-06-11 NOTE — Consult Note (Signed)
WOC wound follow up Current fistula pouch is leaking onto denuded lower abd skin. It will be difficult to maintain the seal for more than 24 hours until denuded weeping areas have resolved. Cleansed all the skin and cut a small Eakin pouch to fit the perimeter of the wound, she has one fistula that is actively draining green effluent at 12 o'clock.Wound is full thickness; 100% beefy red with mod amt green drainage, no odor, bleeds easily when touched. Lower abd with partial thickness skin loss, red and moist with small amt in patchy areas, small amt bleeding. Built up the midline wound just distal to the edge of the perimeter of the Eakin pouch with several strips of eakin barrier to create a dam. Placed pouch to [facility] with suction catheter to allow for the pouch to stay continuously drained and allow the skin some time to heal while she is in the hospital. Mod amt green drainage in cannister. Supplies at bedside and instructions provided for bedside nurses. Julien Girt MSN, RN, [place], [place], [place]

## 2015-06-11 NOTE — Progress Notes (Signed)
[facility]                                                                              Patient Demographics  Marissa [name], is a 74 y.o. female, DOB - 1940-09-17, HLK:[id number]  Admit date - 06/04/2015   Admitting Physician Truett Mainland, DO  Outpatient Primary MD for the patient is [name], MD  LOS - 7   Chief Complaint  Patient presents with  . Abdominal Pain  . Emesis  . Weakness  . Altered Mental Status       Brief HPI   Marissa [name] is a 74 y.o. female With hypertension, hyperlipidemia, GERD, coronary artery disease, stage III chronic kidney disease, history of heart failure, although last echocardiogram on 07/02/14 shows a normal EF of 55-60% and no wall motion abnormalities. The patient also has a complex surgical history: She is status post left hemicolectomy with left transverse colon to sigmoid colon anastomosis for diverticulitis, umbilical hernia repair, repair of incarcerated ventral incisional hernia in 2009, open abdominal hysterectomy in 2013, abdominal wound exploration of enterocutaneous fistula and abscess in 04/28/2015 by Dr.Tsuei. Patient has had an ostomy that as high output. Patient was brought to the emergency department due to increased confusion: History was obtained from her daughter. The patient was unable to recognize her daughter on the morning of admission. She was admitted for further workup.   Assessment & Plan   Principal problem Acute encephalopathy appears to be close to baseline Alert and oriented, follows all verbal commands.  CT head showed no acute findings. Family at the bedside confirmed improving mental status  Active problems Acute renal failure on chronic kidney disease, stage IIlikely from dehydration, possibly due to volume depletion with ischemic ATN, UA negative for any UTI Creatinine improving. Nephrology following, continue IV fluid hydration   Metabolic acidosis:  Bicarbonate is improving, per  nephrology, discontinue bicarbonate drip and gave 1 amp of sodium bicarbonate.   Essential hypertension  - Continue IV Lopressor  H/o chronic anemia, will close monitor, no need of transfusion yet. (h/o transfusion reaction per chart, but daughter denies) - Follow CBC, iron panel consistent with anemia of chronic disease  H/o NIDDM2: last A1c 5.7 in 03/2015, - Continue sliding scale insulin  H/o gout:  - stable, colchicine held due to ARF, consider uloric at discharge  Chronic enterocutaneuos fistula: s/p Eakin pouch, difficult seal - Gen. surgery following, management per wound care and general surgery - Currently npo and TPN.  - sandostatin started on 9/26 per surgery  Hypokalemia Replaced  Adult failure to thrive - will need SNF or LTACH  , discussed with case management and patient's family  Code Status: full code  Family Communication: Discussed in detail with the patient, all imaging results, lab results explained to the patient , patient's daughter and son at the bedside   Disposition Plan: Will likely need long-term care   Time Spent in minutes  25 minutes  Procedures  Chronic TPN   Consults   Nephrology   general surgery  DVT Prophylaxis  Lovenox   Medications  Scheduled Meds: . enoxaparin (LOVENOX) injection  40 mg  Subcutaneous Q24H  . insulin aspart  0-9 Units Subcutaneous 6 times per day  . nystatin  5 mL Oral QID  . octreotide  100 mcg Subcutaneous Q12H  . pantoprazole (PROTONIX) IV  40 mg Intravenous QHS   Continuous Infusions: . sodium chloride 75 mL/hr at 06/11/15 1001  . Marland KitchenTPN (CLINIMIX-E) Adult     And  . fat emulsion     PRN Meds:.acetaminophen **OR** acetaminophen, alum & mag hydroxide-simeth, methocarbamol, metoprolol, morphine injection, nitroGLYCERIN, ondansetron **OR** ondansetron (ZOFRAN) IV, sodium chloride   Antibiotics   Anti-infectives    None        Subjective:   Marissa [name] was seen and examined today. Much more  Alert and oriented, following verbal commands, family at the bedside. Hoping she can start eating again. No fevers or chills, denies any pain. No acute events overnight.  Objective:   Blood pressure 122/80, pulse 97, temperature 98.6 F (37 C), temperature source Oral, resp. rate 11, height 5\' 4"  (1.626 m), weight 105.3 kg (232 lb 2.3 oz), SpO2 100 %.  Wt Readings from Last 3 Encounters:  06/10/15 105.3 kg (232 lb 2.3 oz)  05/01/15 99.3 kg (218 lb 14.7 oz)  04/15/15 99.338 kg (219 lb)     Intake/Output Summary (Last 24 hours) at 06/11/15 1146 Last data filed at 06/11/15 0913  Gross per 24 hour  Intake 754.58 ml  Output   1875 ml  Net -1120.42 ml    Exam  General: Alert and  awake NAD, somewhat slow to respond, oriented 2  HEENT:  PERRLA, EOMI, Anicteric Sclera, mucous membranes moist.   Neck: Supple, no JVD, no masses  CVS: S1 S2 clear, RRR  Respiratory: CTAB  Abdomen: eakin's pouch in place with NGT to suction, nondistended, nontender  Extremities : no cyanosis clubbing or edema  Neuro: alert and awake, able to lift up her legs  Skin: No rashes  Psych: oriented 2, normal affect and demeanor   Data Review   Micro Results Recent Results (from the past 240 hour(s))  Urine culture     Status: None   Collection Time: 06/04/15 10:39 AM  Result Value Ref Range Status   Specimen Description URINE, CATHETERIZED  Final   Special Requests NONE  Final   Culture NO GROWTH 1 DAY  Final   Report Status 06/05/2015 FINAL  Final  Blood Culture (routine x 2)     Status: None   Collection Time: 06/04/15 10:50 AM  Result Value Ref Range Status   Specimen Description BLOOD PICC LINE  Final   Special Requests BOTTLES DRAWN AEROBIC AND ANAEROBIC 5CC  Final   Culture NO GROWTH 5 DAYS  Final   Report Status 06/09/2015 FINAL  Final  Blood Culture (routine x 2)     Status: None   Collection Time: 06/04/15 11:00 AM  Result Value Ref Range Status   Specimen Description BLOOD  RIGHT HAND  Final   Special Requests BOTTLES DRAWN AEROBIC AND ANAEROBIC 5CC  Final   Culture NO GROWTH 5 DAYS  Final   Report Status 06/09/2015 FINAL  Final    Radiology Reports Ct Abdomen Pelvis Wo Contrast  06/04/2015   CLINICAL DATA:  74 year old female with acute right abdominal and flank pain, nausea and vomiting for 1 week.  EXAM: CT ABDOMEN AND PELVIS WITHOUT CONTRAST  TECHNIQUE: Multidetector CT imaging of the abdomen and pelvis was performed following the standard protocol without IV contrast.  COMPARISON:  05/06/2015 and prior CTs  FINDINGS: Please  note that parenchymal abnormalities may be missed without intravenous contrast.  Lower chest:  Coronary artery calcifications again identified.  Hepatobiliary: The liver is unremarkable. Patient is status post cholecystectomy. There is no evidence biliary dilatation.  Pancreas: Unremarkable  Spleen: Unremarkable  Adrenals/Urinary Tract: Mild bilateral renal atrophy again identified. The kidneys, adrenal glands and bladder are otherwise unremarkable. There is no evidence of hydronephrosis or urinary calculi.  Stomach/Bowel: A small hiatal hernia is again noted. There is no evidence of bowel obstruction or focal bowel wall thickening. Descending and sigmoid colonic diverticulosis noted without evidence of diverticulitis.  Vascular/Lymphatic: Aortic atherosclerotic calcifications noted without aneurysm. No enlarged lymph nodes are identified.  Reproductive: The patient is status post hysterectomy. The adnexal regions are unremarkable.  Other: No free fluid or pneumoperitoneum. Periumbilical surgical changes are identified. There is no evidence of ventral hernia.  Musculoskeletal: No acute abnormalities identified. Multilevel mild degenerative disc disease and moderate facet arthropathy in the lumbar spine noted.  IMPRESSION: No evidence of acute abnormality. No evidence of urinary calculi or hydronephrosis.  Small hiatal hernia and mild bilateral renal  atrophy again noted.  Multilevel degenerative changes within the lumbar spine.  Coronary artery disease and aortic atherosclerosis.   Electronically Signed   By: Margarette Canada M.D.   On: 06/04/2015 12:09   Ct Head Wo Contrast  06/06/2015   CLINICAL DATA:  Abnormal speech.  EXAM: CT HEAD WITHOUT CONTRAST  TECHNIQUE: Contiguous axial images were obtained from the base of the skull through the vertex without intravenous contrast.  COMPARISON:  12/28/2014  FINDINGS: Mild generalized cerebral atrophy is normal for age. There is no evidence of acute cortical infarct, intracranial hemorrhage, mass, midline shift, or extra-axial fluid collection. Periventricular white-matter hypodensities are unchanged and nonspecific but compatible with mild chronic small vessel ischemic disease.  Orbits are unremarkable. Visualized paranasal sinuses and mastoid air cells are clear. Mild atherosclerotic calcification is noted at the skullbase.  IMPRESSION: No evidence of acute intracranial abnormality.   Electronically Signed   By: Logan Bores M.D.   On: 06/06/2015 13:22   US Renal  06/08/2015   CLINICAL DATA:  Elevated creatinine  EXAM: RENAL / URINARY TRACT ULTRASOUND COMPLETE  COMPARISON:  CT from 4 days ago  FINDINGS: Right Kidney:  Length: 10 cm. 12 mm interpolar cyst. Lobulated renal cortex with thinning. No hydronephrosis. No evidence of solid mass.  Left Kidney:  Length: 10 cm. Lobulated renal cortex with thinning. No hydronephrosis. No evidence of mass.  Bladder:  Appears normal for degree of bladder distention.  IMPRESSION: 1. No hydronephrosis. 2. Renal atrophy.   Electronically Signed   By: Monte Fantasia M.D.   On: 06/08/2015 21:35   Dg Chest Port 1 View  06/04/2015   CLINICAL DATA:  Sepsis.  Low-grade fever.  EXAM: PORTABLE CHEST 1 VIEW  COMPARISON:  04/27/2015  FINDINGS: Right PICC line tip:  SVC.  Atherosclerotic aortic arch. Thoracic spondylosis. Upper normal heart size.  The lungs appear clear.  IMPRESSION: 1.  Atherosclerosis. 2. Right PICC line tip:  SVC.   Electronically Signed   By: Van Clines M.D.   On: 06/04/2015 10:55    CBC  Recent Labs Lab 06/05/15 0500 06/08/15 0553  WBC 8.1 10.2  HGB 7.8* 7.2*  HCT 24.2* 22.9*  PLT 239 189  MCV 70.3* 71.1*  MCH 22.7* 22.4*  MCHC 32.2 31.4  RDW 16.9* 17.2*  LYMPHSABS 1.1 0.7  MONOABS 1.0 1.1*  EOSABS 0.1 0.2  BASOSABS 0.0 0.0  Chemistries   Recent Labs Lab 06/05/15 0500 06/06/15 0522 06/07/15 0614 06/08/15 0553 06/09/15 0600 06/10/15 0600 06/11/15 0558  NA 141 144 144 140 145 143 144  K 5.0 4.7 4.7 4.0 3.6 3.2* 3.6  CL 128* 129* 130* 125* 119* 115* 114*  CO2 9* 9* 9* 8* 17* 21* 23  GLUCOSE 114* 111* 116* 151* 148* 172* 133*  BUN 105* 98* 107* 122* 112* 94* 79*  CREATININE 2.03* 1.97* 2.01* 2.50* 2.19* 1.92* 1.75*  CALCIUM 9.6 9.5 9.4 9.0 8.8* 8.2* 8.1*  MG 2.7* 2.0  --  1.6* 2.0  --  1.9  AST 35  --   --  29  --   --  36  ALT 84*  --   --  58*  --   --  50  ALKPHOS 107  --   --  104  --   --  106  BILITOT 0.5  --   --  0.3  --   --  0.3   ------------------------------------------------------------------------------------------------------------------ estimated creatinine clearance is 33.3 mL/min (by C-G formula based on Cr of 1.75). ------------------------------------------------------------------------------------------------------------------ No results for input(s): HGBA1C in the last 72 hours. ------------------------------------------------------------------------------------------------------------------ No results for input(s): CHOL, HDL, LDLCALC, TRIG, CHOLHDL, LDLDIRECT in the last 72 hours. ------------------------------------------------------------------------------------------------------------------ No results for input(s): TSH, T4TOTAL, T3FREE, THYROIDAB in the last 72 hours.  Invalid input(s):  FREET3 ------------------------------------------------------------------------------------------------------------------  Recent Labs  06/09/15 0600  TIBC 174*  IRON 52    Coagulation profile No results for input(s): INR, PROTIME in the last 168 hours.  No results for input(s): DDIMER in the last 72 hours.  Cardiac Enzymes No results for input(s): CKMB, TROPONINI, MYOGLOBIN in the last 168 hours.  Invalid input(s): CK ------------------------------------------------------------------------------------------------------------------ Invalid input(s): [place]  06/09/15 1818 06/09/15 2118 06/10/15 0729 06/10/15 1950 06/11/15 0442 06/11/15 0750  GLUCAP 113* 193* 93 193* 168* 66     RAI,RIPUDEEP M.D. [facility] 06/11/2015, 11:46 AM  Pager: [phone] Between 7am to 7pm - call Pager - [phone]  After 7pm go to [email or web address] - password [email or web address]  Call night coverage person covering after 7pm

## 2015-06-11 NOTE — Care Management Important Message (Signed)
Important Message  Patient Details  Name: Marissa [name] MRN: [id number] Date of Birth: [birth year]   Medicare Important Message Given:  Yes-third notification given    Delorse Lek 06/11/2015, 11:58 AM

## 2015-06-12 LAB — GLUCOSE, CAPILLARY
GLUCOSE-CAPILLARY: 177 mg/dL — AB (ref 65–99)
Glucose-Capillary: 137 mg/dL — ABNORMAL HIGH (ref 65–99)
Glucose-Capillary: 174 mg/dL — ABNORMAL HIGH (ref 65–99)
Glucose-Capillary: 78 mg/dL (ref 65–99)
Glucose-Capillary: 135 mg/dL — ABNORMAL HIGH (ref 65–99)
Glucose-Capillary: 72 mg/dL (ref 65–99)

## 2015-06-12 LAB — BASIC METABOLIC PANEL
ANION GAP: 6 (ref 5–15)
BUN: 72 mg/dL — AB (ref 6–20)
CALCIUM: 8.5 mg/dL — AB (ref 8.9–10.3)
CO2: 22 mmol/L (ref 22–32)
Chloride: 118 mmol/L — ABNORMAL HIGH (ref 101–111)
Creatinine, Ser: 1.92 mg/dL — ABNORMAL HIGH (ref 0.44–1.00)
GFR calc Af Amer: 29 mL/min — ABNORMAL LOW (ref >60)
GFR, EST NON AFRICAN AMERICAN: 25 mL/min — AB (ref >60)
GLUCOSE: 188 mg/dL — AB (ref 65–99)
Potassium: 4.2 mmol/L (ref 3.5–5.1)
Sodium: 146 mmol/L — ABNORMAL HIGH (ref 135–145)

## 2015-06-12 LAB — PHOSPHORUS: Phosphorus: 4.2 mg/dL (ref 2.5–4.6)

## 2015-06-12 MED ORDER — FAT EMULSION 20 % IV EMUL
20.0000 mL | INTRAVENOUS | Status: AC
  Administered 2015-06-12: 20 mL via INTRAVENOUS
  Filled 2015-06-12: qty 100

## 2015-06-12 MED ORDER — DEXTROSE-NACL 5-0.45 % IV SOLN
INTRAVENOUS | Status: DC
  Administered 2015-06-12 – 2015-06-15 (×5): via INTRAVENOUS

## 2015-06-12 MED ORDER — TRACE MINERALS CR-CU-MN-SE-ZN 10-1000-500-60 MCG/ML IV SOLN
INTRAVENOUS | Status: AC
  Administered 2015-06-12: 18:00:00 via INTRAVENOUS
  Filled 2015-06-12: qty 1800

## 2015-06-12 NOTE — Progress Notes (Signed)
[facility]                                                                              Patient Demographics  Marissa [name], is a 74 y.o. female, DOB - Sep 02, 1941, JYN:[id number]  Admit date - 06/04/2015   Admitting Physician Truett Mainland, DO  Outpatient Primary MD for the patient is [name], MD  LOS - 8   Chief Complaint  Patient presents with  . Abdominal Pain  . Emesis  . Weakness  . Altered Mental Status       Brief HPI   Marissa [name] is a 74 y.o. female With hypertension, hyperlipidemia, GERD, coronary artery disease, stage III chronic kidney disease, history of heart failure, although last echocardiogram on 07/02/14 shows a normal EF of 55-60% and no wall motion abnormalities. The patient also has a complex surgical history: She is status post left hemicolectomy with left transverse colon to sigmoid colon anastomosis for diverticulitis, umbilical hernia repair, repair of incarcerated ventral incisional hernia in 2009, open abdominal hysterectomy in 2013, abdominal wound exploration of enterocutaneous fistula and abscess in 04/28/2015 by Dr.Tsuei. Patient has had an ostomy that as high output. Patient was brought to the emergency department due to increased confusion: History was obtained from her daughter. The patient was unable to recognize her daughter on the morning of admission. She was admitted for further workup.   Assessment & Plan   Principal problem Acute encephalopathy appears to be close to baseline Alert and oriented, follows all verbal commands.  CT head showed no acute findings.  Active problems Acute renal failure on chronic kidney disease, stage IIlikely from dehydration, possibly due to volume depletion with ischemic ATN, UA negative for any UTI - Changed IV fluids to D5 half normal, (increasing sodium level with creatinine) - Follow creatinine function closely  Metabolic acidosis:  Bicarbonate is improving, per  nephrology, discontinue bicarbonate drip and gave 1 amp of sodium bicarbonate.   Essential hypertension  - Continue IV Lopressor  H/o chronic anemia, will close monitor, no need of transfusion yet. (h/o transfusion reaction per chart, but daughter denies) - Follow CBC, iron panel consistent with anemia of chronic disease  H/o NIDDM2: last A1c 5.7 in 03/2015, - Continue sliding scale insulin  H/o gout:  - stable, colchicine held due to ARF, consider uloric at discharge  Chronic enterocutaneuos fistula: s/p Eakin pouch, difficult seal - Gen. surgery following, management per wound care and general surgery - Started on clears today by surgery, continue TPN - sandostatin started on 9/26 per surgery  Adult failure to thrive - will need SNF or LTACH  , discussed with case management and patient's family  Pain in the right index finger - Unclear etiology, will check uric acid. Obtain ESR, CRP (will be likely elevated due to recent surgery and impression)  Code Status: full code  Family Communication: Discussed in detail with the patient, all imaging results, lab results explained to the patient   Disposition Plan: Will likely need long-term care   Time Spent in minutes  25 minutes  Procedures  Chronic TPN   Consults   Nephrology  general surgery  DVT Prophylaxis  Lovenox   Medications  Scheduled Meds: . enoxaparin (LOVENOX) injection  40 mg Subcutaneous Q24H  . insulin aspart  0-9 Units Subcutaneous 6 times per day  . nystatin  5 mL Oral QID  . octreotide  100 mcg Subcutaneous Q12H  . pantoprazole (PROTONIX) IV  40 mg Intravenous QHS   Continuous Infusions: . sodium chloride 75 mL/hr at 06/12/15 1146  . TPN (CLINIMIX) Adult without lytes     And  . fat emulsion     PRN Meds:.acetaminophen **OR** acetaminophen, alum & mag hydroxide-simeth, methocarbamol, metoprolol, morphine injection, nitroGLYCERIN, ondansetron **OR** ondansetron (ZOFRAN) IV, sodium  chloride   Antibiotics   Anti-infectives    None        Subjective:   Marissa [name] was seen and examined today. Alert and oriented, requesting for diet. Stating pain in the right index finger otherwise no fevers or chills.  No acute events overnight.  Objective:   Blood pressure 135/72, pulse 89, temperature 98.8 F (37.1 C), temperature source Oral, resp. rate 17, height $RemoveBe'5\' 4"'ARZzrzhTP$  (1.626 m), weight 105.8 kg (233 lb 4 oz), SpO2 100 %.  Wt Readings from Last 3 Encounters:  06/11/15 105.8 kg (233 lb 4 oz)  05/01/15 99.3 kg (218 lb 14.7 oz)  04/15/15 99.338 kg (219 lb)     Intake/Output Summary (Last 24 hours) at 06/12/15 1207 Last data filed at 06/12/15 1132  Gross per 24 hour  Intake    600 ml  Output   3325 ml  Net  -2725 ml    Exam  General: Alert and  awake NAD, somewhat slow to respond, oriented 2  HEENT:  PERRLA, EOMI  Neck: Supple, no JVD, no masses  CVS: S1 S2 clear, RRR  Respiratory: CTAB  Abdomen: eakin's pouch in place with NGT to suction, nondistended, nontender  Extremities : no cyanosis clubbing or edema  Neuro: alert and awake, able to lift up her legs  Skin: No rashes  Psych: oriented 2, normal affect and demeanor   Data Review   Micro Results Recent Results (from the past 240 hour(s))  Urine culture     Status: None   Collection Time: 06/04/15 10:39 AM  Result Value Ref Range Status   Specimen Description URINE, CATHETERIZED  Final   Special Requests NONE  Final   Culture NO GROWTH 1 DAY  Final   Report Status 06/05/2015 FINAL  Final  Blood Culture (routine x 2)     Status: None   Collection Time: 06/04/15 10:50 AM  Result Value Ref Range Status   Specimen Description BLOOD PICC LINE  Final   Special Requests BOTTLES DRAWN AEROBIC AND ANAEROBIC 5CC  Final   Culture NO GROWTH 5 DAYS  Final   Report Status 06/09/2015 FINAL  Final  Blood Culture (routine x 2)     Status: None   Collection Time: 06/04/15 11:00 AM  Result Value  Ref Range Status   Specimen Description BLOOD RIGHT HAND  Final   Special Requests BOTTLES DRAWN AEROBIC AND ANAEROBIC 5CC  Final   Culture NO GROWTH 5 DAYS  Final   Report Status 06/09/2015 FINAL  Final    Radiology Reports Ct Abdomen Pelvis Wo Contrast  06/04/2015   CLINICAL DATA:  74 year old female with acute right abdominal and flank pain, nausea and vomiting for 1 week.  EXAM: CT ABDOMEN AND PELVIS WITHOUT CONTRAST  TECHNIQUE: Multidetector CT imaging of the abdomen and pelvis was performed following the standard  protocol without IV contrast.  COMPARISON:  05/06/2015 and prior CTs  FINDINGS: Please note that parenchymal abnormalities may be missed without intravenous contrast.  Lower chest:  Coronary artery calcifications again identified.  Hepatobiliary: The liver is unremarkable. Patient is status post cholecystectomy. There is no evidence biliary dilatation.  Pancreas: Unremarkable  Spleen: Unremarkable  Adrenals/Urinary Tract: Mild bilateral renal atrophy again identified. The kidneys, adrenal glands and bladder are otherwise unremarkable. There is no evidence of hydronephrosis or urinary calculi.  Stomach/Bowel: A small hiatal hernia is again noted. There is no evidence of bowel obstruction or focal bowel wall thickening. Descending and sigmoid colonic diverticulosis noted without evidence of diverticulitis.  Vascular/Lymphatic: Aortic atherosclerotic calcifications noted without aneurysm. No enlarged lymph nodes are identified.  Reproductive: The patient is status post hysterectomy. The adnexal regions are unremarkable.  Other: No free fluid or pneumoperitoneum. Periumbilical surgical changes are identified. There is no evidence of ventral hernia.  Musculoskeletal: No acute abnormalities identified. Multilevel mild degenerative disc disease and moderate facet arthropathy in the lumbar spine noted.  IMPRESSION: No evidence of acute abnormality. No evidence of urinary calculi or hydronephrosis.   Small hiatal hernia and mild bilateral renal atrophy again noted.  Multilevel degenerative changes within the lumbar spine.  Coronary artery disease and aortic atherosclerosis.   Electronically Signed   By: Harmon Pier M.D.   On: 06/04/2015 12:09   Ct Head Wo Contrast  06/06/2015   CLINICAL DATA:  Abnormal speech.  EXAM: CT HEAD WITHOUT CONTRAST  TECHNIQUE: Contiguous axial images were obtained from the base of the skull through the vertex without intravenous contrast.  COMPARISON:  12/28/2014  FINDINGS: Mild generalized cerebral atrophy is normal for age. There is no evidence of acute cortical infarct, intracranial hemorrhage, mass, midline shift, or extra-axial fluid collection. Periventricular white-matter hypodensities are unchanged and nonspecific but compatible with mild chronic small vessel ischemic disease.  Orbits are unremarkable. Visualized paranasal sinuses and mastoid air cells are clear. Mild atherosclerotic calcification is noted at the skullbase.  IMPRESSION: No evidence of acute intracranial abnormality.   Electronically Signed   By: Sebastian Ache M.D.   On: 06/06/2015 13:22   US Renal  06/08/2015   CLINICAL DATA:  Elevated creatinine  EXAM: RENAL / URINARY TRACT ULTRASOUND COMPLETE  COMPARISON:  CT from 4 days ago  FINDINGS: Right Kidney:  Length: 10 cm. 12 mm interpolar cyst. Lobulated renal cortex with thinning. No hydronephrosis. No evidence of solid mass.  Left Kidney:  Length: 10 cm. Lobulated renal cortex with thinning. No hydronephrosis. No evidence of mass.  Bladder:  Appears normal for degree of bladder distention.  IMPRESSION: 1. No hydronephrosis. 2. Renal atrophy.   Electronically Signed   By: Marnee Spring M.D.   On: 06/08/2015 21:35   Dg Chest Port 1 View  06/04/2015   CLINICAL DATA:  Sepsis.  Low-grade fever.  EXAM: PORTABLE CHEST 1 VIEW  COMPARISON:  04/27/2015  FINDINGS: Right PICC line tip:  SVC.  Atherosclerotic aortic arch. Thoracic spondylosis. Upper normal heart  size.  The lungs appear clear.  IMPRESSION: 1. Atherosclerosis. 2. Right PICC line tip:  SVC.   Electronically Signed   By: Gaylyn Rong M.D.   On: 06/04/2015 10:55    CBC  Recent Labs Lab 06/08/15 0553  WBC 10.2  HGB 7.2*  HCT 22.9*  PLT 189  MCV 71.1*  MCH 22.4*  MCHC 31.4  RDW 17.2*  LYMPHSABS 0.7  MONOABS 1.1*  EOSABS 0.2  BASOSABS 0.0  Chemistries   Recent Labs Lab 06/06/15 0522  06/08/15 0553 06/09/15 0600 06/10/15 0600 06/11/15 0558 06/12/15 0430  NA 144  < > 140 145 143 144 146*  K 4.7  < > 4.0 3.6 3.2* 3.6 4.2  CL 129*  < > 125* 119* 115* 114* 118*  CO2 9*  < > 8* 17* 21* 23 22  GLUCOSE 111*  < > 151* 148* 172* 133* 188*  BUN 98*  < > 122* 112* 94* 79* 72*  CREATININE 1.97*  < > 2.50* 2.19* 1.92* 1.75* 1.92*  CALCIUM 9.5  < > 9.0 8.8* 8.2* 8.1* 8.5*  MG 2.0  --  1.6* 2.0  --  1.9  --   AST  --   --  29  --   --  36  --   ALT  --   --  58*  --   --  50  --   ALKPHOS  --   --  104  --   --  106  --   BILITOT  --   --  0.3  --   --  0.3  --   < > = values in this interval not displayed. ------------------------------------------------------------------------------------------------------------------ estimated creatinine clearance is 30.5 mL/min (by C-G formula based on Cr of 1.92). ------------------------------------------------------------------------------------------------------------------ No results for input(s): HGBA1C in the last 72 hours. ------------------------------------------------------------------------------------------------------------------ No results for input(s): CHOL, HDL, LDLCALC, TRIG, CHOLHDL, LDLDIRECT in the last 72 hours. ------------------------------------------------------------------------------------------------------------------ No results for input(s): TSH, T4TOTAL, T3FREE, THYROIDAB in the last 72 hours.  Invalid input(s):  FREET3 ------------------------------------------------------------------------------------------------------------------ No results for input(s): VITAMINB12, FOLATE, FERRITIN, TIBC, IRON, RETICCTPCT in the last 72 hours.  Coagulation profile No results for input(s): INR, PROTIME in the last 168 hours.  No results for input(s): DDIMER in the last 72 hours.  Cardiac Enzymes No results for input(s): CKMB, TROPONINI, MYOGLOBIN in the last 168 hours.  Invalid input(s): CK ------------------------------------------------------------------------------------------------------------------ Invalid input(s): [place]  06/11/15 1635 06/11/15 1953 06/12/15 0001 06/12/15 0403 06/12/15 0749 06/12/15 1157  GLUCAP 105* 167* 137* 174* 3 135*     RAI,RIPUDEEP M.D. [facility] 06/12/2015, 12:07 PM  Pager: [phone] Between 7am to 7pm - call Pager - [phone]  After 7pm go to [email or web address] - password [email or web address]  Call night coverage person covering after 7pm

## 2015-06-12 NOTE — Progress Notes (Signed)
Patient ID: Marissa [name], female   DOB: December 24, 1940, 74 y.o.   MRN: [id number]     [facility]      [address] [place], [place] [address]    Phone: [phone] FAX: [phone]     Subjective: 137m out.  Pt wants clears.  Daughter in room. No abd pain.   Objective:  Vital signs:  Filed Vitals:   06/11/15 0751 06/11/15 1733 06/11/15 2110 06/12/15 0556  BP: 122/80 126/54 136/66 130/57  Pulse: 97 100 95 98  Temp:  98.8 F (37.1 C) 98.8 F (37.1 C) 98.6 F (37 C)  TempSrc:  Oral Oral Oral  Resp: _0 Height:   _1  (1.626 m)   Weight:   105.8 kg (233 lb 4 oz)   SpO2:  100% 100% 100%    Last BM Date: 06/10/15  Intake/Output   Yesterday:  09/29 0701 - 09/30 0700 In: 900 [I.V.:900] Out: 3050 [Urine:2950; Drains:100] This shift:    I/O last 3 completed shifts: In: 900 [I.V.:900] Out: 37711[Urine:3450; Drains:200]  Physical Exam: General: Pt awake/alert/oriented x4 in no acute distress  Abdomen: Soft. Nondistended. Non tender. eakin's pouch in place with NGT to suction with bilious output.     Problem List:   Active Problems:   Dehydration   Encephalopathy   Abdominal pain   Acute renal failure    Results:   Labs: Results for orders placed or performed during the hospital encounter of 06/04/15 (from the past 48 hour(s))  Glucose, capillary     Status: Abnormal   Collection Time: 06/10/15  7:50 PM  Result Value Ref Range   Glucose-Capillary 193 (H) 65 - 99 mg/dL  Glucose, capillary     Status: Abnormal   Collection Time: 06/11/15  4:42 AM  Result Value Ref Range   Glucose-Capillary 168 (H) 65 - 99 mg/dL  Comprehensive metabolic panel     Status: Abnormal   Collection Time: 06/11/15  5:58 AM  Result Value Ref Range   Sodium 144 135 - 145 mmol/L   Potassium 3.6 3.5 - 5.1 mmol/L   Chloride 114 (H) 101 - 111 mmol/L   CO2 23 22 - 32 mmol/L   Glucose, Bld 133 (H) 65 - 99 mg/dL   BUN 79 (H) 6  - 20 mg/dL   Creatinine, Ser 1.75 (H) 0.44 - 1.00 mg/dL   Calcium 8.1 (L) 8.9 - 10.3 mg/dL   Total Protein 5.5 (L) 6.5 - 8.1 g/dL   Albumin 1.9 (L) 3.5 - 5.0 g/dL   AST 36 15 - 41 U/L   ALT 50 14 - 54 U/L   Alkaline Phosphatase 106 38 - 126 U/L   Total Bilirubin 0.3 0.3 - 1.2 mg/dL   GFR calc non Af Amer 28 (L) >60 mL/min   GFR calc Af Amer 32 (L) >60 mL/min    Comment: (NOTE) The eGFR has been calculated using the CKD EPI equation. This calculation has not been validated in all clinical situations. eGFR's persistently <60 mL/min signify possible Chronic Kidney Disease.    Anion gap 7 5 - 15  Magnesium     Status: None   Collection Time: 06/11/15  5:58 AM  Result Value Ref Range   Magnesium 1.9 1.7 - 2.4 mg/dL  Phosphorus     Status: None   Collection Time: 06/11/15  5:58 AM  Result Value Ref Range   Phosphorus 3.7 2.5 - 4.6 mg/dL  Glucose, capillary     Status: None   Collection Time: 06/11/15  7:50 AM  Result Value Ref Range   Glucose-Capillary 90 65 - 99 mg/dL  Glucose, capillary     Status: Abnormal   Collection Time: 06/11/15 11:35 AM  Result Value Ref Range   Glucose-Capillary 143 (H) 65 - 99 mg/dL  Glucose, capillary     Status: Abnormal   Collection Time: 06/11/15  4:35 PM  Result Value Ref Range   Glucose-Capillary 105 (H) 65 - 99 mg/dL  Glucose, capillary     Status: Abnormal   Collection Time: 06/11/15  7:53 PM  Result Value Ref Range   Glucose-Capillary 167 (H) 65 - 99 mg/dL  Glucose, capillary     Status: Abnormal   Collection Time: 06/12/15 12:01 AM  Result Value Ref Range   Glucose-Capillary 137 (H) 65 - 99 mg/dL  Glucose, capillary     Status: Abnormal   Collection Time: 06/12/15  4:03 AM  Result Value Ref Range   Glucose-Capillary 174 (H) 65 - 99 mg/dL  Basic metabolic panel     Status: Abnormal   Collection Time: 06/12/15  4:30 AM  Result Value Ref Range   Sodium 146 (H) 135 - 145 mmol/L   Potassium 4.2 3.5 - 5.1 mmol/L   Chloride 118 (H) 101 -  111 mmol/L   CO2 22 22 - 32 mmol/L   Glucose, Bld 188 (H) 65 - 99 mg/dL   BUN 72 (H) 6 - 20 mg/dL   Creatinine, Ser 1.92 (H) 0.44 - 1.00 mg/dL   Calcium 8.5 (L) 8.9 - 10.3 mg/dL   GFR calc non Af Amer 25 (L) >60 mL/min   GFR calc Af Amer 29 (L) >60 mL/min    Comment: (NOTE) The eGFR has been calculated using the CKD EPI equation. This calculation has not been validated in all clinical situations. eGFR's persistently <60 mL/min signify possible Chronic Kidney Disease.    Anion gap 6 5 - 15  Phosphorus     Status: None   Collection Time: 06/12/15  4:30 AM  Result Value Ref Range   Phosphorus 4.2 2.5 - 4.6 mg/dL  Glucose, capillary     Status: None   Collection Time: 06/12/15  7:49 AM  Result Value Ref Range   Glucose-Capillary 78 65 - 99 mg/dL    Imaging / Studies: No results found.  Medications / Allergies:  Scheduled Meds: . enoxaparin (LOVENOX) injection  40 mg Subcutaneous Q24H  . insulin aspart  0-9 Units Subcutaneous 6 times per day  . nystatin  5 mL Oral QID  . octreotide  100 mcg Subcutaneous Q12H  . pantoprazole (PROTONIX) IV  40 mg Intravenous QHS   Continuous Infusions: . sodium chloride 75 mL/hr at 06/11/15 2223  . TPN (CLINIMIX) Adult without lytes     And  . fat emulsion     PRN Meds:.acetaminophen **OR** acetaminophen, alum & mag hydroxide-simeth, methocarbamol, metoprolol, morphine injection, nitroGLYCERIN, ondansetron **OR** ondansetron (ZOFRAN) IV, sodium chloride  Antibiotics: Anti-infectives    None       Assessment/Plan S/p abdominal wound exploration and explantation of infected mesh 04/28/15 - Tsuei EC fistula - recurrent -allow for sips of clears, watch output, if increased, will need to back off.  TPN to try to allow fistula to heal. -eakin's pouch to suction  -accurate I&Os.  -pain control  -wound looks good and surrounding skin is healing.  -standostatin restarted 9/26. PCM-TPN AKI-renal on board. Improving.  Dispo-SNF when  medically stable  Erby Pian, [facility] Pager [phone]) For consults and floor pages call [phone](7A-4:30P)  06/12/2015 9:19 AM

## 2015-06-12 NOTE — Consult Note (Signed)
WOC wound follow up Current fistula pouch is beginning to leak onto denuded lower abd skin. It will be difficult to maintain the seal for more than 24 hours until denuded weeping areas have resolved. Cleansed all the skin and cut a small Eakin pouch to fit the perimeter of the wound, she has one fistula that is actively draining green effluent at 12 o'clock.Wound is full thickness; 100% beefy red with mod amt green drainage, no odor, bleeds easily when touched. Lower abd with partial thickness skin loss, red and moist with small amt in patchy area. Built up the midline wound just distal to the edge of the perimeter of the Eakin pouch with several strips of eakin barrier to create a dam. Placed pouch to [facility] with suction catheter to allow for the pouch to stay continuously drained and allow the skin some time to heal while she is in the hospital. Decreasing amt amt green drainage in cannister. Less pain with dressing changes, no bleeding.  Daughter at bedside to assess wounds and states they are greatly improved. Supplies at bedside and instructions provided for bedside nurses. Julien Girt MSN, RN, [place], [place], [place]

## 2015-06-12 NOTE — Clinical Social Work Note (Addendum)
CSW advised by nurse case manager that insurance denied LTAC for patient at approx. 3:45 pm. CSW contacted [facility] and talked with Santiago Glad in admissions to request they begin authorization process with [facility]. CSW informed at 4:14 pm by Santiago Glad that they will need TPN mixture information for patient and that it takes approx. 24 hours for them to get the TPN.  Daughter contacted and updated regarding LTAC denial and contact with [facility].  Nutrition note forwarded to [facility] from pharmacy regarding patient TPN.  Crawford Givens, MSW, LCSW Licensed Clinical Social Worker [place] [phone]

## 2015-06-12 NOTE — Progress Notes (Signed)
Physical Therapy Treatment Patient Details Name: Marissa [name] MRN: [id number] DOB: April 16, 1941 Today's Date: 06/12/2015    History of Present Illness Patient is a 75 yo female admitted 06/04/15 with AMS, abdominal pain, dehydration, weakness, abdominal wounds.  PMH:  HTN, arthritis, CAD, DM, neuropathy, CHF, CKD, ostomy    PT Comments    Patient progressing to OOB this session with encouragement and family assist.  Feel nursing safest to move back to bed via lift due to foot pain (nursing informed.)  Feel she will progress nicely in [facility] setting as medical needs can be managed as well as progressing with therapy   Follow Up Recommendations  LTACH     Equipment Recommendations  None recommended by PT    Recommendations for Other Services       Precautions / Restrictions Precautions Precautions: Fall Precaution Comments: drain from abdominal wound    Mobility  Bed Mobility Overal bed mobility: Needs Assistance Bed Mobility: Rolling;Sidelying to Sit Rolling: Mod assist   Supine to sit: Mod assist;+2 for physical assistance     General bed mobility comments: cues for technique due to abdominal wound and use of rail with side to sit, assist for legs off bed and to lift trunk upright, pt helping pusing up on railing  Transfers Overall transfer level: Needs assistance   Transfers: Lateral/Scoot Transfers          Lateral/Scoot Transfers: Mod assist;Max assist;+2 physical assistance General transfer comment: attempted anterior weight shift for sit to stand or pivot transfer, but pt unable to tolerate weight on her feet, so used bed pad to assist with lateral scoot patient very motivated and able to assist some with scooting onto recliner with armrest down.  Son entered room in process and pt asked for his help so he assisted with tech and PT for lateral scooting into chair  Ambulation/Gait                 Stairs            Wheelchair Mobility    Modified  Rankin (Stroke Patients Only)       Balance Overall balance assessment: Needs assistance   Sitting balance-Leahy Scale: Fair Sitting balance - Comments: able to balance static without UE support, right UE pain limiting use for assist wtih dynamic balance while scooting, but no loss of balance during transitional movement                            Cognition Arousal/Alertness: Awake/alert Behavior During Therapy: WFL for tasks assessed/performed Overall Cognitive Status: Within Functional Limits for tasks assessed                      Exercises General Exercises - Lower Extremity Ankle Circles/Pumps: AROM;AAROM;Both;10 reps;Supine Heel Slides: AAROM;Both;5 reps;Supine    General Comments        Pertinent Vitals/Pain Faces Pain Scale: Hurts whole lot Pain Location: bilateral feet and right index finger, hand Pain Descriptors / Indicators: Aching;Sore Pain Intervention(s): Monitored during session;Limited activity within patient's tolerance;Repositioned    Home Living                      Prior Function            PT Goals (current goals can now be found in the care plan section) Progress towards PT goals: Progressing toward goals    Frequency  Min 2X/week  PT Plan Current plan remains appropriate    Co-evaluation             End of Session Equipment Utilized During Treatment: Gait belt Activity Tolerance: Patient tolerated treatment well Patient left: in chair;with call bell/phone within reach;with family/visitor present     Time: 7076-1518 PT Time Calculation (min) (ACUTE ONLY): 29 min  Charges:  $Therapeutic Exercise: 8-22 mins $Therapeutic Activity: 8-22 mins                    G Codes:      Tyrelle Raczka,CYNDI July 04, 2015, 11:49 AM  [name], [place] 04-Jul-2015

## 2015-06-12 NOTE — Progress Notes (Signed)
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   Recent Labs  06/10/15 0600 06/11/15 0558 06/12/15 0430  NA 143 144 146*  K 3.2* 3.6 4.2  CL 115* 114* 118*  CO2 21* 23 22  GLUCOSE 172* 133* 188*  BUN 94* 79* 72*  CREATININE 1.92* 1.75* 1.92*  CALCIUM 8.2* 8.1* 8.5*  MG  --  1.9  --   PHOS 2.0* 3.7 4.2  PROT  --  5.5*  --   ALBUMIN  --  1.9*  --   AST  --  36  --   ALT  --  50  --   ALKPHOS  --  106  --   BILITOT  --  0.3  --    Estimated Creatinine Clearance: 30.5 mL/min (by C-G formula based on Cr of 1.92).    Recent Labs  06/12/15 0001 06/12/15 0403 06/12/15 0749  GLUCAP 137* 174* 78    Insulin Requirements in the past 24 hours:  Received 5 units insulin yesterday after SSI added back to regimen.  CBG = 133-188  Assessment: [name] with an extensive surgical history to continue on TPN for EC fistula. CT on admit shows no acute abnormality.  Admit: AMS, abd pain  Surgeries/Procedures: hx left hemicolectomy with left transverse colon to sigmoid colon anastomosis for diverticulitis, umbilical hernia repair, repair of incarcerated ventral incisional hernia in 2009, open abd hysterectomy in 2013, abdominal wound exploration of EC fistula and abscess in 04/28/2015  GI: GERD / esophagitis / diverticulitis / occasional bowel incontinence / high output EC fistula. Fistula with bilious output. NPO to allow fistula to heal; prealbumin WNL  at 23.8.  Eakin's pouch to suction with 359mL out. Octreotide started 9/26, PPI IV, +BM 9/28 Endo: gout (colchicine held d/t AKI, MD considering Uloric). DM2 - no insulin in home TPN. CBGs low-nl (90-110) when TPN off but elevated (150-200) when on TPN. Will add sensitive SSI back. Will not add insulin to TPN due to concern that adding insulin to control the elevated value will likely lead to hypoglycemia when off TPN.   Lytes: K+ trending up (4.2) now within normal range.  CoCa WNL at 9.6, Phos back to wnl s/p replacement yesterday. LYTES IN TPN Renal: CKD3 - SCr up 1.92 (baseline 1.23, early Sept was 1.45), BUN improved to 72, UOP improved 1.2 ml/kg/hr. bicarb gtt d/c Pulm: OSA on CPAP - stable on RA Cards: HTN / HLD / CAD / CHF (EF 55-60%) - BP controlled, tachy Hepatobil: LFTs wnl. TG WNL.  Best Practices: Lovenox TPN Access: PICC line placed 04/27/15 TPN start date: Chronic  Current Nutrition:  Cyclic TPN at home (last 8/11)  Home TPN: Cyclic compounded 3:1 TPN with electrolytes over 12 hours at home = 1900 kCal and 90g protein per day No insulin  Nutrition Goals: 1700-1900 kCal and 100-110 gm protein per day  Plan:  - Alternate Clinimix E 5/15 (with electrolytes) and Clinimix 5/15 (without electrolytes),  infuse 1867mL over 12 hrs:  50 ml/hr x 1 hr, then 170 ml/hr x 10 hrs, then 50 ml/hr x 1 hr.  - IVFE at 20 ml/hr x 12 hrs - TPN provides 1800 kCal and 90 gm protein daily, meeting 100% of kCal and 90% of minimal protein needs.  Goal protein for CRF is ~0.8 g/kg/d, currently providing 0.88 g/kg/d. - Continue SSI for now - Daily multivitamin and trace elements - F/U BMET and Phos in a.m.  Rober Minion, PharmD., MS Clinical Pharmacist Pager:  [phone] Thank you for allowing pharmacy to be part of this patients care team. 06/12/2015, 8:20 AM

## 2015-06-13 LAB — GLUCOSE, CAPILLARY
GLUCOSE-CAPILLARY: 104 mg/dL — AB (ref 65–99)
GLUCOSE-CAPILLARY: 116 mg/dL — AB (ref 65–99)
GLUCOSE-CAPILLARY: 158 mg/dL — AB (ref 65–99)
GLUCOSE-CAPILLARY: 164 mg/dL — AB (ref 65–99)
GLUCOSE-CAPILLARY: 175 mg/dL — AB (ref 65–99)
Glucose-Capillary: 106 mg/dL — ABNORMAL HIGH (ref 65–99)

## 2015-06-13 LAB — BASIC METABOLIC PANEL
Anion gap: 6 (ref 5–15)
BUN: 61 mg/dL — AB (ref 6–20)
CHLORIDE: 112 mmol/L — AB (ref 101–111)
CO2: 21 mmol/L — ABNORMAL LOW (ref 22–32)
CREATININE: 1.79 mg/dL — AB (ref 0.44–1.00)
Calcium: 8.3 mg/dL — ABNORMAL LOW (ref 8.9–10.3)
GFR, EST AFRICAN AMERICAN: 31 mL/min — AB (ref >60)
GFR, EST NON AFRICAN AMERICAN: 27 mL/min — AB (ref >60)
Glucose, Bld: 149 mg/dL — ABNORMAL HIGH (ref 65–99)
Potassium: 3.6 mmol/L (ref 3.5–5.1)
SODIUM: 139 mmol/L (ref 135–145)

## 2015-06-13 LAB — URIC ACID: Uric Acid, Serum: 7.5 mg/dL — ABNORMAL HIGH (ref 2.3–6.6)

## 2015-06-13 LAB — C-REACTIVE PROTEIN: CRP: 6.8 mg/dL — AB (ref <1.0)

## 2015-06-13 LAB — SEDIMENTATION RATE: Sed Rate: 130 mm/hr — ABNORMAL HIGH (ref 0–22)

## 2015-06-13 MED ORDER — ALLOPURINOL 300 MG PO TABS
150.0000 mg | ORAL_TABLET | Freq: Every day | ORAL | Status: DC
  Administered 2015-06-13 – 2015-06-16 (×4): 150 mg via ORAL
  Filled 2015-06-13 (×4): qty 1

## 2015-06-13 MED ORDER — FAT EMULSION 20 % IV EMUL
240.0000 mL | INTRAVENOUS | Status: AC
  Administered 2015-06-13: 240 mL via INTRAVENOUS
  Filled 2015-06-13: qty 250

## 2015-06-13 MED ORDER — TRACE MINERALS CR-CU-MN-SE-ZN 10-1000-500-60 MCG/ML IV SOLN
INTRAVENOUS | Status: AC
  Administered 2015-06-13 – 2015-06-14 (×2): via INTRAVENOUS
  Filled 2015-06-13: qty 1800

## 2015-06-13 NOTE — Progress Notes (Signed)
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   Recent Labs  06/11/15 0558 06/12/15 0430 06/13/15 0610  NA 144 146* 139  K 3.6 4.2 3.6  CL 114* 118* 112*  CO2 23 22 21*  GLUCOSE 133* 188* 149*  BUN 79* 72* 61*  CREATININE 1.75* 1.92* 1.79*  CALCIUM 8.1* 8.5* 8.3*  MG 1.9  --   --   PHOS 3.7 4.2  --   PROT 5.5*  --   --   ALBUMIN 1.9*  --   --   AST 36  --   --   ALT 50  --   --   ALKPHOS 106  --   --   BILITOT 0.3  --   --    Estimated Creatinine Clearance: 32.4 mL/min (by C-G formula based on Cr of 1.79).    Recent Labs  06/12/15 2104 06/13/15 0016 06/13/15 0539  GLUCAP 177* 158* 164*    Insulin Requirements in the past 24 hours:  Received 5 units insulin yesterday after SSI added back to regimen.  CBG = 158-177  Assessment: 67 YOF with an extensive surgical history to continue on TPN for EC fistula. CT on admit shows no acute abnormality.  Admit: AMS, abd pain  Surgeries/Procedures: hx left hemicolectomy with left transverse colon to sigmoid colon anastomosis for diverticulitis, umbilical hernia repair, repair of incarcerated ventral incisional hernia in 2009, open abd hysterectomy in 2013, abdominal wound exploration of EC fistula and abscess in 04/28/2015  GI: GERD / esophagitis / diverticulitis / occasional bowel incontinence / high output EC fistula. Fistula with bilious output. NPO to allow fistula to heal; prealbumin WNL at 23.8.  Eakin's pouch  to suction with 36mL out. Octreotide started 9/26, PPI IV, +BM 9/28 Endo: gout (colchicine held d/t AKI, MD considering Uloric). DM2 - no insulin in home TPN. CBGs low-nl (90-110) when TPN off but elevated (150-200) when on TPN. Will add sensitive SSI back. Will not add insulin to TPN due to concern that adding insulin to control the elevated value will likely lead to hypoglycemia when off TPN.   Lytes: K+ staying within normal range alternating TPN with and without electrolytes.  CoCa WNL at 9.6, Phos back to wnl s/p replacement yesterday. LYTES IN TPN today. Renal: CKD3 - SCr hovering between 1.5 and 2.0 (baseline 1.23, early Sept was 1.45), BUN improved to 61, UOP fluctuating - 0.48ml/kg/hr yesterday. Pulm: OSA on CPAP - stable on RA Cards: HTN / HLD / CAD / CHF (EF 55-60%) - BP controlled, tachy Hepatobil: LFTs wnl. TG WNL.  Best Practices: Lovenox TPN Access: PICC line placed 04/27/15 TPN start date: Chronic  Current Nutrition:  Cyclic TPN at home (last 5/78)  Home TPN: Cyclic compounded 3:1 TPN with electrolytes over 12 hours at home = 1900 kCal and 90g protein per day No insulin  Nutrition Goals: 1700-1900 kCal and 100-110 gm protein per day  Plan:  - Continue to alternate Clinimix E  5/15 (with electrolytes) and Clinimix 5/15 (without electrolytes), infuse 1845mL over 12 hrs:  50 ml/hr x 1 hr, then 170 ml/hr x 10 hrs, then 50 ml/hr x 1 hr.  - IVFE at 20 ml/hr x 12 hrs - TPN provides 1800 kCal and 90 gm protein daily, meeting 100% of kCal and 90% of minimal protein needs.  Goal protein for CRF is ~0.8 g/kg/d, currently providing 0.88 g/kg/d. - Continue SSI for now - Daily multivitamin and trace elements - F/U BMET and Phos in a.m.  Rober Minion, PharmD., MS Clinical Pharmacist Pager:  [phone] Thank you for allowing pharmacy to be part of this patients care team. 06/13/2015, 8:14 AM

## 2015-06-13 NOTE — Progress Notes (Signed)
[facility]                                                                              Patient Demographics  Marissa [name], is a 74 y.o. female, DOB - 1940/09/29, KGY:[id number]  Admit date - 06/04/2015   Admitting Physician Remee Charley Krystal Eaton, MD  Outpatient Primary MD for the patient is [name], MD  LOS - 9   Chief Complaint  Patient presents with  . Abdominal Pain  . Emesis  . Weakness  . Altered Mental Status       Brief HPI   Marissa [name] is a 74 y.o. female With hypertension, hyperlipidemia, GERD, coronary artery disease, stage III chronic kidney disease, history of heart failure, although last echocardiogram on 07/02/14 shows a normal EF of 55-60% and no wall motion abnormalities. The patient also has a complex surgical history: She is status post left hemicolectomy with left transverse colon to sigmoid colon anastomosis for diverticulitis, umbilical hernia repair, repair of incarcerated ventral incisional hernia in 2009, open abdominal hysterectomy in 2013, abdominal wound exploration of enterocutaneous fistula and abscess in 04/28/2015 by Dr.Tsuei. Patient has had an ostomy that as high output. Patient was brought to the emergency department due to increased confusion: History was obtained from her daughter. The patient was unable to recognize her daughter on the morning of admission. She was admitted for further workup.   Assessment & Plan   Principal problem Acute encephalopathy appears to be close to baseline Alert and oriented, follows all verbal commands.  CT head showed no acute findings.  Active problems Acute renal failure on chronic kidney disease, stage IIlikely from dehydration, possibly due to volume depletion with ischemic ATN, UA negative for any UTI - Creatinine improved to 1.7 today, follow closely  Metabolic acidosis:  Bicarbonate is improving, per nephrology, discontinue bicarbonate drip and gave 1 amp of sodium bicarbonate.    Essential hypertension  - Continue IV Lopressor  H/o chronic anemia, will close monitor, no need of transfusion yet. (h/o transfusion reaction per chart, but daughter denies) - Follow CBC, iron panel consistent with anemia of chronic disease  H/o NIDDM2: last A1c 5.7 in 03/2015, - Continue sliding scale insulin  H/o gout, pain in the right index finger:  - stable, colchicine held due to ARF, place on allopurinol at discharge - will check uric acid. Obtain ESR, CRP (will be likely elevated due to recent surgery and impression)  Chronic enterocutaneuos fistula: s/p Eakin pouch, difficult seal - Gen. surgery following, management per wound care and general surgery - Started on clears today by surgery, continue TPN - sandostatin started on 9/26 per surgery  Adult failure to thrive - will need SNF   Code Status: full code  Family Communication: Discussed in detail with the patient, all imaging results, lab results explained to the patient   Disposition Plan: Will likely need long-term care   Time Spent in minutes  25 minutes  Procedures  Chronic TPN   Consults   Nephrology   general surgery  DVT Prophylaxis  Lovenox   Medications  Scheduled Meds: . enoxaparin (LOVENOX) injection  40 mg Subcutaneous Q24H  .  insulin aspart  0-9 Units Subcutaneous 6 times per day  . nystatin  5 mL Oral QID  . octreotide  100 mcg Subcutaneous Q12H  . pantoprazole (PROTONIX) IV  40 mg Intravenous QHS   Continuous Infusions: . dextrose 5 % and 0.45% NaCl 50 mL/hr at 06/13/15 0853  . Marland KitchenTPN (CLINIMIX-E) Adult     And  . fat emulsion    . TPN (CLINIMIX) Adult without lytes 50 mL/hr at 06/12/15 1806   PRN Meds:.acetaminophen **OR** acetaminophen, alum & mag hydroxide-simeth, methocarbamol, metoprolol, morphine injection, nitroGLYCERIN, ondansetron **OR** ondansetron (ZOFRAN) IV, sodium chloride   Antibiotics   Anti-infectives    None        Subjective:   Marissa [name] was seen  and examined today. Alert and oriented, slight swelling in the right index finger with pain otherwise no fevers or chills.  No acute events overnight.  Objective:   Blood pressure 125/62, pulse 87, temperature 98.6 F (37 C), temperature source Oral, resp. rate 18, height 5' 4" (1.626 m), weight 104 kg (229 lb 4.5 oz), SpO2 100 %.  Wt Readings from Last 3 Encounters:  06/12/15 104 kg (229 lb 4.5 oz)  05/01/15 99.3 kg (218 lb 14.7 oz)  04/15/15 99.338 kg (219 lb)     Intake/Output Summary (Last 24 hours) at 06/13/15 1115 Last data filed at 06/13/15 0930  Gross per 24 hour  Intake    120 ml  Output   1250 ml  Net  -1130 ml    Exam  General: Alert and  awake NAD  HEENT:  PERRLA, EOMI  Neck: Supple, no JVD, no masses  CVS: S1 S2 clear, RRR  Respiratory: CTAB  Abdomen: eakin's pouch in place with NGT to suction, ND, NBS  Extremities : no cyanosis clubbing or edema  Neuro: alert and awake, able to lift up her legs  Skin: No rashes  Psych: oriented 2, normal affect and demeanor   Data Review   Micro Results Recent Results (from the past 240 hour(s))  Urine culture     Status: None   Collection Time: 06/04/15 10:39 AM  Result Value Ref Range Status   Specimen Description URINE, CATHETERIZED  Final   Special Requests NONE  Final   Culture NO GROWTH 1 DAY  Final   Report Status 06/05/2015 FINAL  Final  Blood Culture (routine x 2)     Status: None   Collection Time: 06/04/15 10:50 AM  Result Value Ref Range Status   Specimen Description BLOOD PICC LINE  Final   Special Requests BOTTLES DRAWN AEROBIC AND ANAEROBIC 5CC  Final   Culture NO GROWTH 5 DAYS  Final   Report Status 06/09/2015 FINAL  Final  Blood Culture (routine x 2)     Status: None   Collection Time: 06/04/15 11:00 AM  Result Value Ref Range Status   Specimen Description BLOOD RIGHT HAND  Final   Special Requests BOTTLES DRAWN AEROBIC AND ANAEROBIC 5CC  Final   Culture NO GROWTH 5 DAYS  Final    Report Status 06/09/2015 FINAL  Final    Radiology Reports Ct Abdomen Pelvis Wo Contrast  06/04/2015   CLINICAL DATA:  74 year old female with acute right abdominal and flank pain, nausea and vomiting for 1 week.  EXAM: CT ABDOMEN AND PELVIS WITHOUT CONTRAST  TECHNIQUE: Multidetector CT imaging of the abdomen and pelvis was performed following the standard protocol without IV contrast.  COMPARISON:  05/06/2015 and prior CTs  FINDINGS: Please note that parenchymal  abnormalities may be missed without intravenous contrast.  Lower chest:  Coronary artery calcifications again identified.  Hepatobiliary: The liver is unremarkable. Patient is status post cholecystectomy. There is no evidence biliary dilatation.  Pancreas: Unremarkable  Spleen: Unremarkable  Adrenals/Urinary Tract: Mild bilateral renal atrophy again identified. The kidneys, adrenal glands and bladder are otherwise unremarkable. There is no evidence of hydronephrosis or urinary calculi.  Stomach/Bowel: A small hiatal hernia is again noted. There is no evidence of bowel obstruction or focal bowel wall thickening. Descending and sigmoid colonic diverticulosis noted without evidence of diverticulitis.  Vascular/Lymphatic: Aortic atherosclerotic calcifications noted without aneurysm. No enlarged lymph nodes are identified.  Reproductive: The patient is status post hysterectomy. The adnexal regions are unremarkable.  Other: No free fluid or pneumoperitoneum. Periumbilical surgical changes are identified. There is no evidence of ventral hernia.  Musculoskeletal: No acute abnormalities identified. Multilevel mild degenerative disc disease and moderate facet arthropathy in the lumbar spine noted.  IMPRESSION: No evidence of acute abnormality. No evidence of urinary calculi or hydronephrosis.  Small hiatal hernia and mild bilateral renal atrophy again noted.  Multilevel degenerative changes within the lumbar spine.  Coronary artery disease and aortic  atherosclerosis.   Electronically Signed   By: Margarette Canada M.D.   On: 06/04/2015 12:09   Ct Head Wo Contrast  06/06/2015   CLINICAL DATA:  Abnormal speech.  EXAM: CT HEAD WITHOUT CONTRAST  TECHNIQUE: Contiguous axial images were obtained from the base of the skull through the vertex without intravenous contrast.  COMPARISON:  12/28/2014  FINDINGS: Mild generalized cerebral atrophy is normal for age. There is no evidence of acute cortical infarct, intracranial hemorrhage, mass, midline shift, or extra-axial fluid collection. Periventricular white-matter hypodensities are unchanged and nonspecific but compatible with mild chronic small vessel ischemic disease.  Orbits are unremarkable. Visualized paranasal sinuses and mastoid air cells are clear. Mild atherosclerotic calcification is noted at the skullbase.  IMPRESSION: No evidence of acute intracranial abnormality.   Electronically Signed   By: Logan Bores M.D.   On: 06/06/2015 13:22   US Renal  06/08/2015   CLINICAL DATA:  Elevated creatinine  EXAM: RENAL / URINARY TRACT ULTRASOUND COMPLETE  COMPARISON:  CT from 4 days ago  FINDINGS: Right Kidney:  Length: 10 cm. 12 mm interpolar cyst. Lobulated renal cortex with thinning. No hydronephrosis. No evidence of solid mass.  Left Kidney:  Length: 10 cm. Lobulated renal cortex with thinning. No hydronephrosis. No evidence of mass.  Bladder:  Appears normal for degree of bladder distention.  IMPRESSION: 1. No hydronephrosis. 2. Renal atrophy.   Electronically Signed   By: Monte Fantasia M.D.   On: 06/08/2015 21:35   Dg Chest Port 1 View  06/04/2015   CLINICAL DATA:  Sepsis.  Low-grade fever.  EXAM: PORTABLE CHEST 1 VIEW  COMPARISON:  04/27/2015  FINDINGS: Right PICC line tip:  SVC.  Atherosclerotic aortic arch. Thoracic spondylosis. Upper normal heart size.  The lungs appear clear.  IMPRESSION: 1. Atherosclerosis. 2. Right PICC line tip:  SVC.   Electronically Signed   By: Van Clines M.D.   On: 06/04/2015  10:55    CBC  Recent Labs Lab 06/08/15 0553  WBC 10.2  HGB 7.2*  HCT 22.9*  PLT 189  MCV 71.1*  MCH 22.4*  MCHC 31.4  RDW 17.2*  LYMPHSABS 0.7  MONOABS 1.1*  EOSABS 0.2  BASOSABS 0.0    Chemistries   Recent Labs Lab 06/08/15 0553 06/09/15 0600 06/10/15 0600 06/11/15 0558 06/12/15  0430 06/13/15 0610  NA 140 145 143 144 146* 139  K 4.0 3.6 3.2* 3.6 4.2 3.6  CL 125* 119* 115* 114* 118* 112*  CO2 8* 17* 21* 23 22 21*  GLUCOSE 151* 148* 172* 133* 188* 149*  BUN 122* 112* 94* 79* 72* 61*  CREATININE 2.50* 2.19* 1.92* 1.75* 1.92* 1.79*  CALCIUM 9.0 8.8* 8.2* 8.1* 8.5* 8.3*  MG 1.6* 2.0  --  1.9  --   --   AST 29  --   --  36  --   --   ALT 58*  --   --  50  --   --   ALKPHOS 104  --   --  106  --   --   BILITOT 0.3  --   --  0.3  --   --    ------------------------------------------------------------------------------------------------------------------ estimated creatinine clearance is 32.4 mL/min (by C-G formula based on Cr of 1.79). ------------------------------------------------------------------------------------------------------------------ No results for input(s): HGBA1C in the last 72 hours. ------------------------------------------------------------------------------------------------------------------ No results for input(s): CHOL, HDL, LDLCALC, TRIG, CHOLHDL, LDLDIRECT in the last 72 hours. ------------------------------------------------------------------------------------------------------------------ No results for input(s): TSH, T4TOTAL, T3FREE, THYROIDAB in the last 72 hours.  Invalid input(s): FREET3 ------------------------------------------------------------------------------------------------------------------ No results for input(s): VITAMINB12, FOLATE, FERRITIN, TIBC, IRON, RETICCTPCT in the last 72 hours.  Coagulation profile No results for input(s): INR, PROTIME in the last 168 hours.  No results for input(s): DDIMER in the last 72  hours.  Cardiac Enzymes No results for input(s): CKMB, TROPONINI, MYOGLOBIN in the last 168 hours.  Invalid input(s): CK ------------------------------------------------------------------------------------------------------------------ Invalid input(s): Delta  06/12/15 1157 06/12/15 1624 06/12/15 2104 06/13/15 0016 06/13/15 0539 06/13/15 0808  GLUCAP 135* 72 177* 158* 164* 106*     Evonna Stoltz M.D. [facility] 06/13/2015, 11:15 AM  Pager: [phone] Between 7am to 7pm - call Pager - [phone]  After 7pm go to [email or web address] - password [email or web address]  Call night coverage person covering after 7pm

## 2015-06-13 NOTE — Progress Notes (Signed)
[facility] Progress Note     Subjective: Pt denies much pain.  No N/V, tolerating clears.  Complains of leg pain, but improved with positions.  Last BM 9/28.  Objective: Vital signs in last 24 hours: Temp:  [97.7 F (36.5 C)-98.8 F (37.1 C)] 98.6 F (37 C) (10/01 0540) Pulse Rate:  [87-94] 87 (10/01 0945) Resp:  [18-20] 18 (10/01 0540) BP: (106-137)/(52-74) 125/62 mmHg (10/01 0945) SpO2:  [98 %-100 %] 100 % (10/01 0945) Weight:  [104 kg (229 lb 4.5 oz)] 104 kg (229 lb 4.5 oz) (09/30 2111) Last BM Date: 06/10/15  Intake/Output from previous day: 09/30 0701 - 10/01 0700 In: 0  Out: 1500 [Urine:1500] Intake/Output this shift:    PE: Gen:  Alert, NAD, pleasant Abd: Soft, NT/ND, +BS, no HSM, eakins pouch in place with much better adherence than before.  Bilious drainage in NG canister from NG tube in wound.  Output from drain not recorded in last 24 hours.   Lab Results:  No results for input(s): WBC, HGB, HCT, PLT in the last 72 hours. BMET  Recent Labs  06/12/15 0430 06/13/15 0610  NA 146* 139  K 4.2 3.6  CL 118* 112*  CO2 22 21*  GLUCOSE 188* 149*  BUN 72* 61*  CREATININE 1.92* 1.79*  CALCIUM 8.5* 8.3*   PT/INR No results for input(s): LABPROT, INR in the last 72 hours. CMP     Component Value Date/Time   NA 139 06/13/2015 0610   K 3.6 06/13/2015 0610   CL 112* 06/13/2015 0610   CO2 21* 06/13/2015 0610   GLUCOSE 149* 06/13/2015 0610   BUN 61* 06/13/2015 0610   CREATININE 1.79* 06/13/2015 0610   CREATININE 1.64* 06/27/2014 1647   CALCIUM 8.3* 06/13/2015 0610   PROT 5.5* 06/11/2015 0558   ALBUMIN 1.9* 06/11/2015 0558   AST 36 06/11/2015 0558   ALT 50 06/11/2015 0558   ALKPHOS 106 06/11/2015 0558   BILITOT 0.3 06/11/2015 0558   GFRNONAA 27* 06/13/2015 0610   GFRAA 31* 06/13/2015 0610   Lipase     Component Value Date/Time   LIPASE 27 12/26/2014 1815       Studies/Results: No results found.  Anti-infectives: Anti-infectives     None       Assessment/Plan S/p abdominal wound exploration and explantation of infected mesh 04/28/15 - Tsuei EC fistula - recurrent -allow for sips of clears, watch output, if increased, will need to back off. TPN to try to allow fistula to heal. -eakin's pouch to suction  -Re-iterated accurate I&Os in orders and with nursing staff.  We need accurate output from drain to determine if fistula is improving or not.  Only about 157mL bilious output in canister currently. -pain control -Wound looks good and surrounding skin is healing.  -standostatin restarted 9/26. PCM-TPN AKI-renal on board. Improving.  Dispo-SNF when medically stable    LOS: 9 days    Marissa [name] 06/13/2015, 10:51 AM Pager: [phone]

## 2015-06-14 LAB — GLUCOSE, CAPILLARY
GLUCOSE-CAPILLARY: 172 mg/dL — AB (ref 65–99)
GLUCOSE-CAPILLARY: 97 mg/dL (ref 65–99)
GLUCOSE-CAPILLARY: 162 mg/dL — AB (ref 65–99)
Glucose-Capillary: 131 mg/dL — ABNORMAL HIGH (ref 65–99)
Glucose-Capillary: 138 mg/dL — ABNORMAL HIGH (ref 65–99)
Glucose-Capillary: 72 mg/dL (ref 65–99)

## 2015-06-14 LAB — BASIC METABOLIC PANEL
Anion gap: 5 (ref 5–15)
BUN: 63 mg/dL — ABNORMAL HIGH (ref 6–20)
CALCIUM: 8.5 mg/dL — AB (ref 8.9–10.3)
CO2: 22 mmol/L (ref 22–32)
CREATININE: 1.77 mg/dL — AB (ref 0.44–1.00)
Chloride: 112 mmol/L — ABNORMAL HIGH (ref 101–111)
GFR calc non Af Amer: 27 mL/min — ABNORMAL LOW (ref >60)
GFR, EST AFRICAN AMERICAN: 31 mL/min — AB (ref >60)
GLUCOSE: 132 mg/dL — AB (ref 65–99)
Potassium: 4 mmol/L (ref 3.5–5.1)
Sodium: 139 mmol/L (ref 135–145)

## 2015-06-14 LAB — CBC
HEMATOCRIT: 19.1 % — AB (ref 36.0–46.0)
Hemoglobin: 5.9 g/dL — CL (ref 12.0–15.0)
MCH: 22.3 pg — ABNORMAL LOW (ref 26.0–34.0)
MCHC: 30.9 g/dL (ref 30.0–36.0)
MCV: 72.3 fL — ABNORMAL LOW (ref 78.0–100.0)
Platelets: 155 10*3/uL (ref 150–400)
RBC: 2.64 MIL/uL — ABNORMAL LOW (ref 3.87–5.11)
RDW: 15.6 % — AB (ref 11.5–15.5)
WBC: 8.8 10*3/uL (ref 4.0–10.5)

## 2015-06-14 LAB — OCCULT BLOOD X 1 CARD TO LAB, STOOL: Fecal Occult Bld: NEGATIVE

## 2015-06-14 LAB — PREPARE RBC (CROSSMATCH)

## 2015-06-14 MED ORDER — FAT EMULSION 20 % IV EMUL
20.0000 mL | INTRAVENOUS | Status: DC
  Filled 2015-06-14: qty 100

## 2015-06-14 MED ORDER — FAT EMULSION 20 % IV EMUL
240.0000 mL | INTRAVENOUS | Status: AC
  Administered 2015-06-14: 240 mL via INTRAVENOUS
  Filled 2015-06-14: qty 250

## 2015-06-14 MED ORDER — SODIUM CHLORIDE 0.9 % IV SOLN
Freq: Once | INTRAVENOUS | Status: AC
  Administered 2015-06-14: 17:00:00 via INTRAVENOUS

## 2015-06-14 MED ORDER — TRACE MINERALS CR-CU-MN-SE-ZN 10-1000-500-60 MCG/ML IV SOLN
INTRAVENOUS | Status: AC
  Administered 2015-06-14: 18:00:00 via INTRAVENOUS
  Filled 2015-06-14: qty 1800

## 2015-06-14 MED ORDER — TRACE MINERALS CR-CU-MN-SE-ZN 10-1000-500-60 MCG/ML IV SOLN
INTRAVENOUS | Status: DC
  Filled 2015-06-14: qty 1800

## 2015-06-14 NOTE — Progress Notes (Signed)
[facility]                                                                              Patient Demographics  Marissa [name], is a 74 y.o. female, DOB - [birth year], MOQ:[id number]  Admit date - 06/04/2015   Admitting Physician Ripudeep Krystal Eaton, MD  Outpatient Primary MD for the patient is [name], MD  LOS - 10   Chief Complaint  Patient presents with  . Abdominal Pain  . Emesis  . Weakness  . Altered Mental Status       Brief HPI   Marissa [name] is a 74 y.o. female With hypertension, hyperlipidemia, GERD, coronary artery disease, stage III chronic kidney disease, history of heart failure, although last echocardiogram on 07/02/14 shows a normal EF of 55-60% and no wall motion abnormalities. The patient also has a complex surgical history: She is status post left hemicolectomy with left transverse colon to sigmoid colon anastomosis for diverticulitis, umbilical hernia repair, repair of incarcerated ventral incisional hernia in 2009, open abdominal hysterectomy in 2013, abdominal wound exploration of enterocutaneous fistula and abscess in 04/28/2015 by Dr.Tsuei. Patient has had an ostomy that as high output. Patient was brought to the emergency department due to increased confusion: History was obtained from her daughter. The patient was unable to recognize her daughter on the morning of admission. She was admitted for further workup.   Assessment & Plan   Principal problem Chronic enterocutaneuos fistula: s/p Eakin pouch, difficult seal - Gen. surgery following, management per wound care and general surgery - Continue clear liquid diet and TPN - sandostatin started on 9/26 per surgery  Active problems  Acute on chronic anemia, (h/o transfusion reaction per chart, but daughter denies) - Transfuse 2 units packed RBCs, ordered - Obtain stool occult test, per RN, has not noticed any bleeding episodes - Patient had a colonoscopy done in 4/16 (Dr Collene Mares) had shown  pan diverticulosis - EGD 01/13/15, Dr. Benson Norway, showed hemorrhagic gastritis, hiatal hernia. Had recommended PPI and transfuse as needed  Acute renal failure on chronic kidney disease, stage IIlikely from dehydration, possibly due to volume depletion with ischemic ATN, UA negative for any UTI - Creatinine stable at 1.7   Acute encephalopathy appears to be close to baseline Alert and oriented, follows all verbal commands. CT head showed no acute findings.  Metabolic acidosis:  Bicarbonate is improving  Essential hypertension  - Continue IV Lopressor as needed  H/o NIDDM2: last A1c 5.7 in 03/2015, - Continue sliding scale insulin  H/o gout, pain in the right index finger: Improving - stable, colchicine held due to ARF, - Uric acid 7.5, had started on allopurinol yesterday  Adult failure to thrive - will need SNF , unfortunately was declined by [facility]  Code Status: full code  Family Communication: Discussed in detail with the patient, all imaging results, lab results explained to the patient   Disposition Plan: Will likely need long-term care   Time Spent in minutes  25 minutes  Procedures  Chronic TPN   Consults   Nephrology   general surgery  DVT Prophylaxis  Lovenox   Medications  Scheduled Meds: .  allopurinol  150 mg Oral Daily  . enoxaparin (LOVENOX) injection  40 mg Subcutaneous Q24H  . insulin aspart  0-9 Units Subcutaneous 6 times per day  . nystatin  5 mL Oral QID  . octreotide  100 mcg Subcutaneous Q12H  . pantoprazole (PROTONIX) IV  40 mg Intravenous QHS   Continuous Infusions: . dextrose 5 % and 0.45% NaCl 50 mL/hr at 06/14/15 0551  . TPN (CLINIMIX) Adult without lytes     And  . fat emulsion     PRN Meds:.acetaminophen **OR** acetaminophen, alum & mag hydroxide-simeth, methocarbamol, metoprolol, morphine injection, nitroGLYCERIN, ondansetron **OR** ondansetron (ZOFRAN) IV, sodium chloride   Antibiotics   Anti-infectives    None         Subjective:   Marissa [name] was seen and examined today. Alert and oriented, no specific complaints, no bleeding episodes. No fevers or chills. No acute events overnight. No nausea, vomiting or any hematemesis, hematochezia or melena   Objective:   Blood pressure 112/50, pulse 91, temperature 99.2 F (37.3 C), temperature source Oral, resp. rate 18, height 5\' 4"  (1.626 m), weight 102.6 kg (226 lb 3.1 oz), SpO2 100 %.  Wt Readings from Last 3 Encounters:  06/13/15 102.6 kg (226 lb 3.1 oz)  05/01/15 99.3 kg (218 lb 14.7 oz)  04/15/15 99.338 kg (219 lb)     Intake/Output Summary (Last 24 hours) at 06/14/15 2876 Last data filed at 06/14/15 0757  Gross per 24 hour  Intake 1474.17 ml  Output   2405 ml  Net -930.83 ml    Exam  General: Alert and  awake NAD  HEENT:  PERRLA, EOMI  Neck: Supple, no JVD, no masses  CVS: S1 S2 clear, RRR  Respiratory: CTAB  Abdomen: eakin's pouch in place with NGT to suction, ND, NBS  Extremities : no cyanosis clubbing or edema  Neuro: no new deficits  Skin: No rashes  Psych: oriented 2, normal affect and demeanor   Data Review   Micro Results Recent Results (from the past 240 hour(s))  Urine culture     Status: None   Collection Time: 06/04/15 10:39 AM  Result Value Ref Range Status   Specimen Description URINE, CATHETERIZED  Final   Special Requests NONE  Final   Culture NO GROWTH 1 DAY  Final   Report Status 06/05/2015 FINAL  Final  Blood Culture (routine x 2)     Status: None   Collection Time: 06/04/15 10:50 AM  Result Value Ref Range Status   Specimen Description BLOOD PICC LINE  Final   Special Requests BOTTLES DRAWN AEROBIC AND ANAEROBIC 5CC  Final   Culture NO GROWTH 5 DAYS  Final   Report Status 06/09/2015 FINAL  Final  Blood Culture (routine x 2)     Status: None   Collection Time: 06/04/15 11:00 AM  Result Value Ref Range Status   Specimen Description BLOOD RIGHT HAND  Final   Special Requests BOTTLES  DRAWN AEROBIC AND ANAEROBIC 5CC  Final   Culture NO GROWTH 5 DAYS  Final   Report Status 06/09/2015 FINAL  Final    Radiology Reports Ct Abdomen Pelvis Wo Contrast  06/04/2015   CLINICAL DATA:  74 year old female with acute right abdominal and flank pain, nausea and vomiting for 1 week.  EXAM: CT ABDOMEN AND PELVIS WITHOUT CONTRAST  TECHNIQUE: Multidetector CT imaging of the abdomen and pelvis was performed following the standard protocol without IV contrast.  COMPARISON:  05/06/2015 and prior CTs  FINDINGS: Please  note that parenchymal abnormalities may be missed without intravenous contrast.  Lower chest:  Coronary artery calcifications again identified.  Hepatobiliary: The liver is unremarkable. Patient is status post cholecystectomy. There is no evidence biliary dilatation.  Pancreas: Unremarkable  Spleen: Unremarkable  Adrenals/Urinary Tract: Mild bilateral renal atrophy again identified. The kidneys, adrenal glands and bladder are otherwise unremarkable. There is no evidence of hydronephrosis or urinary calculi.  Stomach/Bowel: A small hiatal hernia is again noted. There is no evidence of bowel obstruction or focal bowel wall thickening. Descending and sigmoid colonic diverticulosis noted without evidence of diverticulitis.  Vascular/Lymphatic: Aortic atherosclerotic calcifications noted without aneurysm. No enlarged lymph nodes are identified.  Reproductive: The patient is status post hysterectomy. The adnexal regions are unremarkable.  Other: No free fluid or pneumoperitoneum. Periumbilical surgical changes are identified. There is no evidence of ventral hernia.  Musculoskeletal: No acute abnormalities identified. Multilevel mild degenerative disc disease and moderate facet arthropathy in the lumbar spine noted.  IMPRESSION: No evidence of acute abnormality. No evidence of urinary calculi or hydronephrosis.  Small hiatal hernia and mild bilateral renal atrophy again noted.  Multilevel degenerative  changes within the lumbar spine.  Coronary artery disease and aortic atherosclerosis.   Electronically Signed   By: Margarette Canada M.D.   On: 06/04/2015 12:09   Ct Head Wo Contrast  06/06/2015   CLINICAL DATA:  Abnormal speech.  EXAM: CT HEAD WITHOUT CONTRAST  TECHNIQUE: Contiguous axial images were obtained from the base of the skull through the vertex without intravenous contrast.  COMPARISON:  12/28/2014  FINDINGS: Mild generalized cerebral atrophy is normal for age. There is no evidence of acute cortical infarct, intracranial hemorrhage, mass, midline shift, or extra-axial fluid collection. Periventricular white-matter hypodensities are unchanged and nonspecific but compatible with mild chronic small vessel ischemic disease.  Orbits are unremarkable. Visualized paranasal sinuses and mastoid air cells are clear. Mild atherosclerotic calcification is noted at the skullbase.  IMPRESSION: No evidence of acute intracranial abnormality.   Electronically Signed   By: Logan Bores M.D.   On: 06/06/2015 13:22   US Renal  06/08/2015   CLINICAL DATA:  Elevated creatinine  EXAM: RENAL / URINARY TRACT ULTRASOUND COMPLETE  COMPARISON:  CT from 4 days ago  FINDINGS: Right Kidney:  Length: 10 cm. 12 mm interpolar cyst. Lobulated renal cortex with thinning. No hydronephrosis. No evidence of solid mass.  Left Kidney:  Length: 10 cm. Lobulated renal cortex with thinning. No hydronephrosis. No evidence of mass.  Bladder:  Appears normal for degree of bladder distention.  IMPRESSION: 1. No hydronephrosis. 2. Renal atrophy.   Electronically Signed   By: Monte Fantasia M.D.   On: 06/08/2015 21:35   Dg Chest Port 1 View  06/04/2015   CLINICAL DATA:  Sepsis.  Low-grade fever.  EXAM: PORTABLE CHEST 1 VIEW  COMPARISON:  04/27/2015  FINDINGS: Right PICC line tip:  SVC.  Atherosclerotic aortic arch. Thoracic spondylosis. Upper normal heart size.  The lungs appear clear.  IMPRESSION: 1. Atherosclerosis. 2. Right PICC line tip:  SVC.    Electronically Signed   By: Van Clines M.D.   On: 06/04/2015 10:55    CBC  Recent Labs Lab 06/08/15 0553 06/14/15 0550  WBC 10.2 8.8  HGB 7.2* 5.9*  HCT 22.9* 19.1*  PLT 189 155  MCV 71.1* 72.3*  MCH 22.4* 22.3*  MCHC 31.4 30.9  RDW 17.2* 15.6*  LYMPHSABS 0.7  --   MONOABS 1.1*  --   EOSABS 0.2  --  BASOSABS 0.0  --     Chemistries   Recent Labs Lab 06/08/15 0553 06/09/15 0600 06/10/15 0600 06/11/15 0558 06/12/15 0430 06/13/15 0610 06/14/15 0550  NA 140 145 143 144 146* 139 139  K 4.0 3.6 3.2* 3.6 4.2 3.6 4.0  CL 125* 119* 115* 114* 118* 112* 112*  CO2 8* 17* 21* 23 22 21* 22  GLUCOSE 151* 148* 172* 133* 188* 149* 132*  BUN 122* 112* 94* 79* 72* 61* 63*  CREATININE 2.50* 2.19* 1.92* 1.75* 1.92* 1.79* 1.77*  CALCIUM 9.0 8.8* 8.2* 8.1* 8.5* 8.3* 8.5*  MG 1.6* 2.0  --  1.9  --   --   --   AST 29  --   --  36  --   --   --   ALT 58*  --   --  50  --   --   --   ALKPHOS 104  --   --  106  --   --   --   BILITOT 0.3  --   --  0.3  --   --   --    ------------------------------------------------------------------------------------------------------------------ estimated creatinine clearance is 32.5 mL/min (by C-G formula based on Cr of 1.77). ------------------------------------------------------------------------------------------------------------------ No results for input(s): HGBA1C in the last 72 hours. ------------------------------------------------------------------------------------------------------------------ No results for input(s): CHOL, HDL, LDLCALC, TRIG, CHOLHDL, LDLDIRECT in the last 72 hours. ------------------------------------------------------------------------------------------------------------------ No results for input(s): TSH, T4TOTAL, T3FREE, THYROIDAB in the last 72 hours.  Invalid input(s): FREET3 ------------------------------------------------------------------------------------------------------------------ No results for  input(s): VITAMINB12, FOLATE, FERRITIN, TIBC, IRON, RETICCTPCT in the last 72 hours.  Coagulation profile No results for input(s): INR, PROTIME in the last 168 hours.  No results for input(s): DDIMER in the last 72 hours.  Cardiac Enzymes No results for input(s): CKMB, TROPONINI, MYOGLOBIN in the last 168 hours.  Invalid input(s): CK ------------------------------------------------------------------------------------------------------------------ Invalid input(s): [place]  06/13/15 1205 06/13/15 1650 06/13/15 2047 06/14/15 0005 06/14/15 0439 06/14/15 0745  GLUCAP 116* 104* 175* 138* 172* 63     RAI,RIPUDEEP M.D. [facility] 06/14/2015, 9:24 AM  Pager: [phone] Between 7am to 7pm - call Pager - [phone]  After 7pm go to [email or web address] - password [email or web address]  Call night coverage person covering after 7pm

## 2015-06-14 NOTE — Progress Notes (Signed)
CRITICAL VALUE ALERT  Critical value received:  Hbg 5.9  Date of notification:  06/14/15  Time of notification:  0637  Critical value read back:Yes.    Nurse who received alert:  Shelbie Hutching  MD notified (1st page):  Dr. Rogue Bussing  Time of first page:  [phone]  MD notified (2nd page):  Time of second page:  Responding MD:  Dr. Rogue Bussing  Time MD responded:  [phone]

## 2015-06-14 NOTE — Progress Notes (Signed)
  Subjective: Still with some abdominal pain  Objective: Vital signs in last 24 hours: Temp:  [97.9 F (36.6 C)-99.2 F (37.3 C)] 99.2 F (37.3 C) (10/02 0500) Pulse Rate:  [84-91] 91 (10/02 0500) Resp:  [18] 18 (10/02 0500) BP: (112-119)/(50-65) 112/50 mmHg (10/02 0500) SpO2:  [100 %] 100 % (10/02 0500) Weight:  [102.6 kg (226 lb 3.1 oz)] 102.6 kg (226 lb 3.1 oz) (10/01 2056) Last BM Date: 06/10/15  Intake/Output from previous day: 10/01 0701 - 10/02 0700 In: 1474.2 [P.O.:240; I.V.:1234.2] Out: 2400 [Urine:2325; Drains:75] Intake/Output this shift: Total I/O In: -  Out: 5 [Drains:5]  Abdomen soft, minimally tender Pouch working well  Lab Results:   Recent Labs  06/14/15 0550  WBC 8.8  HGB 5.9*  HCT 19.1*  PLT 155   BMET  Recent Labs  06/13/15 0610 06/14/15 0550  NA 139 139  K 3.6 4.0  CL 112* 112*  CO2 21* 22  GLUCOSE 149* 132*  BUN 61* 63*  CREATININE 1.79* 1.77*  CALCIUM 8.3* 8.5*   PT/INR No results for input(s): LABPROT, INR in the last 72 hours. ABG No results for input(s): PHART, HCO3 in the last 72 hours.  Invalid input(s): PCO2, PO2  Studies/Results: No results found.  Anti-infectives: Anti-infectives    None      Assessment/Plan: Enterocutaneous fistula  Continue current care Transfusing for chronic blood loss anemia  LOS: 10 days    Marissa [name] A 06/14/2015

## 2015-06-14 NOTE — Progress Notes (Signed)
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  No Known Allergies  Patient Measurements: Height: 5\' 4"  (162.6 cm) Weight: 226 lb 3.1 oz (102.6 kg) IBW/kg (Calculated) : 54.7  Vital Signs: Temp: 99.2 F (37.3 C) (10/02 0500) Temp Source: Oral (10/02 0500) BP: 112/50 mmHg (10/02 0500) Pulse Rate: 91 (10/02 0500) Intake/Output from previous day: 10/01 0701 - 10/02 0700 In: 1474.2 [P.O.:240; I.V.:1234.2] Out: 2400 [Urine:2325; Drains:75]  Labs:  Recent Labs  06/14/15 0550  WBC 8.8  HGB 5.9*  HCT 19.1*  PLT 155     Recent Labs  06/12/15 0430 06/13/15 0610 06/14/15 0550  NA 146* 139 139  K 4.2 3.6 4.0  CL 118* 112* 112*  CO2 22 21* 22  GLUCOSE 188* 149* 132*  BUN 72* 61* 63*  CREATININE 1.92* 1.79* 1.77*  CALCIUM 8.5* 8.3* 8.5*  PHOS 4.2  --   --    Estimated Creatinine Clearance: 32.5 mL/min (by C-G formula based on Cr of 1.77).    Recent Labs  06/14/15 0005 06/14/15 0439 06/14/15 0745  GLUCAP 138* 172* 72    Insulin Requirements in the past 24 hours:  Received 5 units insulin yesterday after SSI added back to regimen.  CBG = 72-175  Assessment: 38 YOF with an extensive surgical history to continue on TPN for EC fistula. CT on admit shows no acute abnormality.  Admit: AMS, abd pain  Surgeries/Procedures: hx left hemicolectomy with left transverse colon to sigmoid colon anastomosis for diverticulitis, umbilical hernia repair, repair of incarcerated ventral incisional hernia in 2009, open abd hysterectomy in 2013, abdominal wound exploration of EC fistula and abscess in 04/28/2015  GI: GERD / esophagitis / diverticulitis / occasional bowel incontinence / high output EC fistula. Fistula with bilious output. NPO to allow fistula to heal; prealbumin WNL at 23.8.  Eakin's pouch to suction - drainage not recorded. Octreotide started 9/26, PPI IV, +BM 9/28 Endo: gout (colchicine held  d/t AKI, MD considering Uloric) - c/o finger pain, uric acid elevated slightly 7.5. DM2 - no insulin in home TPN. CBGs low-nl (90-110) when TPN off but elevated (150-200) when on TPN. Will add sensitive SSI back. Will not add insulin to TPN due to concern that adding insulin to control the elevated value will likely lead to hypoglycemia when off TPN.   Lytes: K+ staying within normal range alternating TPN with and without electrolytes.  CoCa WNL at 9.6, Phos back to wnl s/p replacement yesterday. LYTES removed IN TPN today. Renal: CKD3 - SCr hovering between 1.5 and 2.0 (baseline 1.23, early Sept was 1.45), BUN improved to 63, UOP fluctuating - 0.87ml/kg/hr yesterday. Pulm: OSA on CPAP - stable on RA Cards: HTN / HLD / CAD / CHF (EF 55-60%) - BP controlled, tachy Hepatobil: LFTs wnl. TG WNL. HEME:  Hx. of anemia, now with H/H of 5.9/19.1 - will likely need transfusion  Best Practices: Lovenox TPN Access: PICC line placed 04/27/15 TPN start date: Chronic  Current Nutrition:  Cyclic TPN at home (last 3/64)  Home TPN: Cyclic compounded 3:1 TPN with electrolytes over 12 hours at home = 1900 kCal and 90g protein per day No insulin  Nutrition Goals: 1700-1900 kCal and 100-110 gm protein per day  Plan:  - Continue to alternate Clinimix E 5/15 (with electrolytes) and Clinimix 5/15 (without electrolytes), infuse 1820mL over 12 hrs:  50 ml/hr x 1 hr, then 170 ml/hr x 10 hrs, then 50 ml/hr  x 1 hr.  - IVFE at 20 ml/hr x 12 hrs - TPN provides 1800 kCal and 90 gm protein daily, meeting 100% of kCal and 90% of minimal protein needs.  Goal protein for CRF is ~0.8 g/kg/d, currently providing 0.88 g/kg/d. - Continue SSI for now - Daily multivitamin and trace elements - F/U BMET, Mag and Phos in a.m.  Rober Minion, PharmD., MS Clinical Pharmacist Pager:  [phone] Thank you for allowing pharmacy to be part of this patients care team. 06/14/2015, 8:33 AM

## 2015-06-15 LAB — CBC
HEMATOCRIT: 25.4 % — AB (ref 36.0–46.0)
Hemoglobin: 8.2 g/dL — ABNORMAL LOW (ref 12.0–15.0)
MCH: 24.3 pg — ABNORMAL LOW (ref 26.0–34.0)
MCHC: 32.3 g/dL (ref 30.0–36.0)
MCV: 75.4 fL — ABNORMAL LOW (ref 78.0–100.0)
Platelets: 196 10*3/uL (ref 150–400)
RBC: 3.37 MIL/uL — ABNORMAL LOW (ref 3.87–5.11)
RDW: 15.7 % — AB (ref 11.5–15.5)
WBC: 9.6 10*3/uL (ref 4.0–10.5)

## 2015-06-15 LAB — PHOSPHORUS: PHOSPHORUS: 3.1 mg/dL (ref 2.5–4.6)

## 2015-06-15 LAB — COMPREHENSIVE METABOLIC PANEL
ALT: 79 U/L — ABNORMAL HIGH (ref 14–54)
ANION GAP: 7 (ref 5–15)
AST: 54 U/L — ABNORMAL HIGH (ref 15–41)
Albumin: 1.8 g/dL — ABNORMAL LOW (ref 3.5–5.0)
Alkaline Phosphatase: 151 U/L — ABNORMAL HIGH (ref 38–126)
BUN: 62 mg/dL — ABNORMAL HIGH (ref 6–20)
CALCIUM: 8.4 mg/dL — AB (ref 8.9–10.3)
CHLORIDE: 109 mmol/L (ref 101–111)
CO2: 20 mmol/L — AB (ref 22–32)
Creatinine, Ser: 1.72 mg/dL — ABNORMAL HIGH (ref 0.44–1.00)
GFR calc non Af Amer: 28 mL/min — ABNORMAL LOW (ref >60)
GFR, EST AFRICAN AMERICAN: 33 mL/min — AB (ref >60)
Glucose, Bld: 113 mg/dL — ABNORMAL HIGH (ref 65–99)
Potassium: 3.7 mmol/L (ref 3.5–5.1)
SODIUM: 136 mmol/L (ref 135–145)
Total Bilirubin: 0.7 mg/dL (ref 0.3–1.2)
Total Protein: 5.8 g/dL — ABNORMAL LOW (ref 6.5–8.1)

## 2015-06-15 LAB — TYPE AND SCREEN
ABO/RH(D): B POS
Antibody Screen: NEGATIVE
UNIT DIVISION: 0
UNIT DIVISION: 0
Unit division: 0

## 2015-06-15 LAB — GLUCOSE, CAPILLARY
GLUCOSE-CAPILLARY: 135 mg/dL — AB (ref 65–99)
GLUCOSE-CAPILLARY: 148 mg/dL — AB (ref 65–99)
GLUCOSE-CAPILLARY: 165 mg/dL — AB (ref 65–99)
GLUCOSE-CAPILLARY: 186 mg/dL — AB (ref 65–99)
GLUCOSE-CAPILLARY: 87 mg/dL (ref 65–99)
GLUCOSE-CAPILLARY: 89 mg/dL (ref 65–99)

## 2015-06-15 LAB — PREALBUMIN: PREALBUMIN: 13.5 mg/dL — AB (ref 18–38)

## 2015-06-15 LAB — TRIGLYCERIDES: TRIGLYCERIDES: 49 mg/dL (ref <150)

## 2015-06-15 LAB — DIFFERENTIAL
BASOS PCT: 0 %
Basophils Absolute: 0 10*3/uL (ref 0.0–0.1)
EOS PCT: 2 %
Eosinophils Absolute: 0.2 10*3/uL (ref 0.0–0.7)
LYMPHS PCT: 12 %
Lymphs Abs: 1.1 10*3/uL (ref 0.7–4.0)
Monocytes Absolute: 0.5 10*3/uL (ref 0.1–1.0)
Monocytes Relative: 5 %
NEUTROS ABS: 7.7 10*3/uL (ref 1.7–7.7)
NEUTROS PCT: 80 %

## 2015-06-15 LAB — MAGNESIUM: Magnesium: 1.5 mg/dL — ABNORMAL LOW (ref 1.7–2.4)

## 2015-06-15 MED ORDER — UNABLE TO FIND: Status: DC

## 2015-06-15 MED ORDER — NYSTATIN 100000 UNIT/ML MT SUSP: 5.0000 mL | Freq: Four times a day (QID) | OROMUCOSAL | Status: DC

## 2015-06-15 MED ORDER — INSULIN ASPART 100 UNIT/ML ~~LOC~~ SOLN
0.0000 [IU] | SUBCUTANEOUS | Status: DC
  Administered 2015-06-15 (×2): 2 [IU] via SUBCUTANEOUS
  Administered 2015-06-16: 5 [IU] via SUBCUTANEOUS
  Administered 2015-06-16: 3 [IU] via SUBCUTANEOUS

## 2015-06-15 MED ORDER — MAGNESIUM SULFATE IN D5W 10-5 MG/ML-% IV SOLN
1.0000 g | Freq: Once | INTRAVENOUS | Status: AC
  Administered 2015-06-15: 1 g via INTRAVENOUS
  Filled 2015-06-15: qty 100

## 2015-06-15 MED ORDER — TRACE MINERALS CR-CU-MN-SE-ZN 10-1000-500-60 MCG/ML IV SOLN
INTRAVENOUS | Status: AC
  Administered 2015-06-15: 18:00:00 via INTRAVENOUS
  Filled 2015-06-15: qty 1800

## 2015-06-15 MED ORDER — OCTREOTIDE ACETATE 100 MCG/ML IJ SOLN: 100.0000 ug | Freq: Two times a day (BID) | INTRAMUSCULAR | Status: DC

## 2015-06-15 MED ORDER — FAT EMULSION 20 % IV EMUL
240.0000 mL | INTRAVENOUS | Status: AC
  Administered 2015-06-15: 240 mL via INTRAVENOUS
  Filled 2015-06-15: qty 250

## 2015-06-15 MED ORDER — ALLOPURINOL 300 MG PO TABS: 150.0000 mg | ORAL_TABLET | Freq: Every day | ORAL | Status: DC

## 2015-06-15 MED ORDER — PANTOPRAZOLE SODIUM 40 MG PO TBEC
40.0000 mg | DELAYED_RELEASE_TABLET | Freq: Every day | ORAL | Status: DC
  Administered 2015-06-15: 40 mg via ORAL
  Filled 2015-06-15: qty 1

## 2015-06-15 MED ORDER — INSULIN ASPART 100 UNIT/ML ~~LOC~~ SOLN: SUBCUTANEOUS | Status: DC

## 2015-06-15 NOTE — Progress Notes (Signed)
Patient ID: Marissa [name], female   DOB: 07-10-1941, 74 y.o.   MRN: [id number]     [facility]      [address] [place], [place] [address]    Phone: [phone] FAX: [phone]     Subjective: Tolerating clears.  72ml output from fistula recorded.  Afebrile.  VSS.  Prealbumin 13.5.   Objective:  Vital signs:  Filed Vitals:   06/14/15 1636 06/14/15 1915 06/14/15 2000 06/15/15 0400  BP: 117/60 141/68 131/60 114/54  Pulse: 76 73 80 85  Temp: 98.6 F (37 C) 98.8 F (37.1 C) 98.5 F (36.9 C) 98.7 F (37.1 C)  TempSrc: Oral Oral Oral Oral  Resp: $Remo'16 16 17 16  'sqqTV$ Height:      Weight:      SpO2: 100% 100% 100% 100%    Last BM Date: 06/14/15  Intake/Output   Yesterday:  10/02 0701 - 10/03 0700 In: 1760 [P.O.:240; I.V.:850; Blood:670] Out: 3154 [Urine:3200; Drains:10] This shift:    I/O last 3 completed shifts: In: 2394.2 [P.O.:240; I.V.:1484.2; Blood:670] Out: 0086 [Urine:4525; Drains:10]    Physical Exam: General: Pt awake/alert/oriented x4 in no acute distress  Abdomen: Soft.  Nondistended..  No evidence of peritonitis.  No incarcerated hernias.      Problem List:   Active Problems:   Dehydration   Encephalopathy   Abdominal pain   Acute renal failure ([facility])    Results:   Labs: Results for orders placed or performed during the hospital encounter of 06/04/15 (from the past 48 hour(s))  Glucose, capillary     Status: Abnormal   Collection Time: 06/13/15 12:05 PM  Result Value Ref Range   Glucose-Capillary 116 (H) 65 - 99 mg/dL  Uric acid     Status: Abnormal   Collection Time: 06/13/15  2:46 PM  Result Value Ref Range   Uric Acid, Serum 7.5 (H) 2.3 - 6.6 mg/dL  Sedimentation rate     Status: Abnormal   Collection Time: 06/13/15  2:46 PM  Result Value Ref Range   Sed Rate 130 (H) 0 - 22 mm/hr  C-reactive protein     Status: Abnormal   Collection Time: 06/13/15  2:46 PM  Result Value Ref Range   CRP  6.8 (H) <1.0 mg/dL  Glucose, capillary     Status: Abnormal   Collection Time: 06/13/15  4:50 PM  Result Value Ref Range   Glucose-Capillary 104 (H) 65 - 99 mg/dL  Glucose, capillary     Status: Abnormal   Collection Time: 06/13/15  8:47 PM  Result Value Ref Range   Glucose-Capillary 175 (H) 65 - 99 mg/dL  Glucose, capillary     Status: Abnormal   Collection Time: 06/14/15 12:05 AM  Result Value Ref Range   Glucose-Capillary 138 (H) 65 - 99 mg/dL  Glucose, capillary     Status: Abnormal   Collection Time: 06/14/15  4:39 AM  Result Value Ref Range   Glucose-Capillary 172 (H) 65 - 99 mg/dL  CBC     Status: Abnormal   Collection Time: 06/14/15  5:50 AM  Result Value Ref Range   WBC 8.8 4.0 - 10.5 K/uL   RBC 2.64 (L) 3.87 - 5.11 MIL/uL   Hemoglobin 5.9 (LL) 12.0 - 15.0 g/dL    Comment: REPEATED TO VERIFY CRITICAL RESULT CALLED TO, READ BACK BY AND VERIFIED WITHIsabelle Course AT 7619 06/14/15. K.PAXTON    HCT 19.1 (L) 36.0 - 46.0 %   MCV  72.3 (L) 78.0 - 100.0 fL   MCH 22.3 (L) 26.0 - 34.0 pg   MCHC 30.9 30.0 - 36.0 g/dL   RDW 15.6 (H) 11.5 - 15.5 %   Platelets 155 150 - 400 K/uL    Comment: REPEATED TO VERIFY SPECIMEN CHECKED FOR CLOTS CONSISTENT WITH PREVIOUS RESULT   Basic metabolic panel     Status: Abnormal   Collection Time: 06/14/15  5:50 AM  Result Value Ref Range   Sodium 139 135 - 145 mmol/L   Potassium 4.0 3.5 - 5.1 mmol/L   Chloride 112 (H) 101 - 111 mmol/L   CO2 22 22 - 32 mmol/L   Glucose, Bld 132 (H) 65 - 99 mg/dL   BUN 63 (H) 6 - 20 mg/dL   Creatinine, Ser 1.77 (H) 0.44 - 1.00 mg/dL   Calcium 8.5 (L) 8.9 - 10.3 mg/dL   GFR calc non Af Amer 27 (L) >60 mL/min   GFR calc Af Amer 31 (L) >60 mL/min    Comment: (NOTE) The eGFR has been calculated using the CKD EPI equation. This calculation has not been validated in all clinical situations. eGFR's persistently <60 mL/min signify possible Chronic Kidney Disease.    Anion gap 5 5 - 15  Glucose, capillary      Status: None   Collection Time: 06/14/15  7:45 AM  Result Value Ref Range   Glucose-Capillary 72 65 - 99 mg/dL   Comment 1 Notify RN    Comment 2 Document in Chart   Type and screen     Status: None (Preliminary result)   Collection Time: 06/14/15 10:40 AM  Result Value Ref Range   ABO/RH(D) B POS    Antibody Screen NEG    Sample Expiration 06/17/2015    Unit Number [id number]    Blood Component Type RED CELLS,LR    Unit division 00    Status of Unit DISCARDED    Transfusion Status OK TO TRANSFUSE    Crossmatch Result Compatible    Unit Number [id number]    Blood Component Type RED CELLS,LR    Unit division 00    Status of Unit ISSUED    Transfusion Status OK TO TRANSFUSE    Crossmatch Result Compatible    Unit Number [id number]    Blood Component Type RED CELLS,LR    Unit division 00    Status of Unit ISSUED    Transfusion Status OK TO TRANSFUSE    Crossmatch Result Compatible   Prepare RBC     Status: None   Collection Time: 06/14/15 10:40 AM  Result Value Ref Range   Order Confirmation ORDER PROCESSED BY BLOOD BANK   Glucose, capillary     Status: Abnormal   Collection Time: 06/14/15 11:34 AM  Result Value Ref Range   Glucose-Capillary 131 (H) 65 - 99 mg/dL   Comment 1 Notify RN    Comment 2 Document in Chart   Occult blood card to lab, stool RN will collect     Status: None   Collection Time: 06/14/15  3:40 PM  Result Value Ref Range   Fecal Occult Bld NEGATIVE NEGATIVE  Glucose, capillary     Status: None   Collection Time: 06/14/15  4:40 PM  Result Value Ref Range   Glucose-Capillary 97 65 - 99 mg/dL  Glucose, capillary     Status: Abnormal   Collection Time: 06/14/15  8:01 PM  Result Value Ref Range   Glucose-Capillary 162 (H) 65 - 99 mg/dL  Glucose,  capillary     Status: Abnormal   Collection Time: 06/14/15 11:59 PM  Result Value Ref Range   Glucose-Capillary 165 (H) 65 - 99 mg/dL  Glucose, capillary     Status: Abnormal   Collection Time:  06/15/15  4:04 AM  Result Value Ref Range   Glucose-Capillary 186 (H) 65 - 99 mg/dL  Comprehensive metabolic panel     Status: Abnormal   Collection Time: 06/15/15  6:00 AM  Result Value Ref Range   Sodium 136 135 - 145 mmol/L   Potassium 3.7 3.5 - 5.1 mmol/L   Chloride 109 101 - 111 mmol/L   CO2 20 (L) 22 - 32 mmol/L   Glucose, Bld 113 (H) 65 - 99 mg/dL   BUN 62 (H) 6 - 20 mg/dL   Creatinine, Ser 1.72 (H) 0.44 - 1.00 mg/dL   Calcium 8.4 (L) 8.9 - 10.3 mg/dL   Total Protein 5.8 (L) 6.5 - 8.1 g/dL   Albumin 1.8 (L) 3.5 - 5.0 g/dL   AST 54 (H) 15 - 41 U/L   ALT 79 (H) 14 - 54 U/L   Alkaline Phosphatase 151 (H) 38 - 126 U/L   Total Bilirubin 0.7 0.3 - 1.2 mg/dL   GFR calc non Af Amer 28 (L) >60 mL/min   GFR calc Af Amer 33 (L) >60 mL/min    Comment: (NOTE) The eGFR has been calculated using the CKD EPI equation. This calculation has not been validated in all clinical situations. eGFR's persistently <60 mL/min signify possible Chronic Kidney Disease.    Anion gap 7 5 - 15  Magnesium     Status: Abnormal   Collection Time: 06/15/15  6:00 AM  Result Value Ref Range   Magnesium 1.5 (L) 1.7 - 2.4 mg/dL  Phosphorus     Status: None   Collection Time: 06/15/15  6:00 AM  Result Value Ref Range   Phosphorus 3.1 2.5 - 4.6 mg/dL  Differential     Status: None   Collection Time: 06/15/15  6:00 AM  Result Value Ref Range   Neutrophils Relative % 80 %   Neutro Abs 7.7 1.7 - 7.7 K/uL   Lymphocytes Relative 12 %   Lymphs Abs 1.1 0.7 - 4.0 K/uL   Monocytes Relative 5 %   Monocytes Absolute 0.5 0.1 - 1.0 K/uL   Eosinophils Relative 2 %   Eosinophils Absolute 0.2 0.0 - 0.7 K/uL   Basophils Relative 0 %   Basophils Absolute 0.0 0.0 - 0.1 K/uL  Prealbumin     Status: Abnormal   Collection Time: 06/15/15  6:00 AM  Result Value Ref Range   Prealbumin 13.5 (L) 18 - 38 mg/dL  CBC     Status: Abnormal   Collection Time: 06/15/15  6:00 AM  Result Value Ref Range   WBC 9.6 4.0 - 10.5 K/uL     RBC 3.37 (L) 3.87 - 5.11 MIL/uL   Hemoglobin 8.2 (L) 12.0 - 15.0 g/dL    Comment: DELTA CHECK NOTED POST TRANSFUSION SPECIMEN    HCT 25.4 (L) 36.0 - 46.0 %   MCV 75.4 (L) 78.0 - 100.0 fL   MCH 24.3 (L) 26.0 - 34.0 pg   MCHC 32.3 30.0 - 36.0 g/dL   RDW 15.7 (H) 11.5 - 15.5 %   Platelets 196 150 - 400 K/uL    Comment: PLATELET COUNT CONFIRMED BY SMEAR REPEATED TO VERIFY   Triglycerides     Status: None   Collection Time: 06/15/15  6:00  AM  Result Value Ref Range   Triglycerides 49 <150 mg/dL  Glucose, capillary     Status: None   Collection Time: 06/15/15  7:45 AM  Result Value Ref Range   Glucose-Capillary 89 65 - 99 mg/dL    Imaging / Studies: No results found.  Medications / Allergies:  Scheduled Meds: . allopurinol  150 mg Oral Daily  . enoxaparin (LOVENOX) injection  40 mg Subcutaneous Q24H  . insulin aspart  0-15 Units Subcutaneous 6 times per day  . nystatin  5 mL Oral QID  . octreotide  100 mcg Subcutaneous Q12H  . pantoprazole (PROTONIX) IV  40 mg Intravenous QHS   Continuous Infusions: . dextrose 5 % and 0.45% NaCl 50 mL/hr at 06/14/15 2019   PRN Meds:.acetaminophen **OR** acetaminophen, alum & mag hydroxide-simeth, methocarbamol, metoprolol, morphine injection, nitroGLYCERIN, ondansetron **OR** ondansetron (ZOFRAN) IV, sodium chloride  Antibiotics: Anti-infectives    None      Assessment/Plan S/p abdominal wound exploration and explantation of infected mesh 04/28/15 - Tsuei EC fistula - recurrent -allow for clears, eakin's pouch, does not need to be on wall suction at DC -standostatin restarted 9/26 which has helped, minimal output.  PCM-TPN AKI- Improving.  Dispo-SNF when medically stable  Erby Pian, [facility] Pager [phone]) For consults and floor pages call [phone](7A-4:30P)  06/15/2015  8:45 AM

## 2015-06-15 NOTE — Clinical Social Work Note (Signed)
Patient ready for discharge to [facility], however facility ordered but has not received TPN for patient. CSW informed by admissions director Santiago Glad that nursing director will not accept patient until they have the TPN.  MD updated. CSW will f/u with facility on Tuesday, 10/4.   Marissa [name], MSW, LCSW Licensed Clinical Social Worker [place] [phone]

## 2015-06-15 NOTE — Discharge Summary (Signed)
Physician Discharge Summary   Patient ID: Marissa [name] MRN: [id number] DOB/AGE: [birth year] 74 y.o.  Admit date: 06/04/2015 Discharge date: 06/15/2015  Primary Care Physician:  [name], MD  Discharge Diagnoses:      Chronic enterocutaneous fistula s/p Eakin pouch, difficult seal . Dehydration . acute Encephalopathy   Acute on chronic anemia  . Abdominal pain . Acute renal failure (HCC)   Gout  Hypertension  Insulin-dependent diabetes mellitus    on TPN for diet and wound healing   Consults: Gen. surgery    Recommendations for Outpatient Follow-up:   please follow CBC weekly and transfuse as needed to keep hemoglobin above 8   Please continue Sandostatin/octreotide, until further recommendations from surgery.  Please continue clear liquid diet and TPN at night until otherwise directed by surgery. Patient has a follow-up appointment with Dr. Gershon Crane on 06/26/15 when decision regarding advancing diet will be made.   Wound care:  If Eakin pouch begins to leak, change pouch.  Use barrier rings cut in 1/2 to build up distal edge of wound and fill dips in skin to aid in prevention of leakage. Use ostomy powder to sprinkle on denuded skin distal abdomen, brush away excess, tap with damp washcloth and reapply 2-3 layers. Cover with dry gauze areas that are not within the pouch.   TESTS THAT NEED FOLLOW-UP CBC, BMET   DIET: Clear liquid diet with the TPN nightly    Allergies:  No Known Allergies   Discharge Medications:   Medication List    STOP taking these medications        amLODipine 10 MG tablet  Commonly known as:  NORVASC     colchicine 0.6 MG tablet     furosemide 20 MG tablet  Commonly known as:  LASIX     metoprolol tartrate 25 MG tablet  Commonly known as:  LOPRESSOR     oxyCODONE 5 MG immediate release tablet  Commonly known as:  Oxy IR/ROXICODONE      TAKE these medications        allopurinol 300 MG tablet  Commonly known as:  ZYLOPRIM   Take 0.5 tablets (150 mg total) by mouth daily.     cyanocobalamin 2000 MCG tablet  Take 1 tablet (2,000 mcg total) by mouth daily.     FERIVA 21/7 75-1 MG Tabs  Take 1 tablet by mouth daily.     insulin aspart 100 UNIT/ML injection  Commonly known as:  novoLOG  Sliding scale  CBG 70 - 120: 0 units: CBG 121 - 150: 2 units; CBG 151 - 200: 3 units; CBG 201 - 250: 5 units; CBG 251 - 300: 8 units;CBG 301 - 350: 11 units; CBG 351 - 400: 15 units; CBG > 400 : 15 units and notify MD     methocarbamol 500 MG tablet  Commonly known as:  ROBAXIN  Take 1 tablet (500 mg total) by mouth every 8 (eight) hours as needed for muscle spasms.     NITROSTAT 0.4 MG SL tablet  Generic drug:  nitroGLYCERIN  DISSOLVE ONE TABLET UNDER THE TONGUE EVERY 5 MINUTES AS NEEDED FOR CHEST PAIN.  DO NOT EXCEED A TOTAL OF 3 DOSES IN 15 MINUTES     nystatin 100000 UNIT/ML suspension  Commonly known as:  MYCOSTATIN  Take 5 mLs (500,000 Units total) by mouth 4 (four) times daily.     octreotide 100 MCG/ML Soln injection  Commonly known as:  SANDOSTATIN  Inject 1 mL (100 mcg total) into the  skin every 12 (twelve) hours.     pantoprazole 40 MG tablet  Commonly known as:  PROTONIX  Take 1 tablet (40 mg total) by mouth daily.     UNABLE TO FIND  ADULT TPN per skilled nursing facility protocol QHS/every night (untreated discontinued by surgery).     VOLTAREN 1 % Gel  Generic drug:  diclofenac sodium  Apply 2 g topically 4 (four) times daily as needed (pain).         Brief H and P: For complete details please refer to admission H and P, but in brief [name] is a 74 y.o. female With hypertension, hyperlipidemia, GERD, coronary artery disease, stage III chronic kidney disease, history of heart failure, although last echocardiogram on 07/02/14 shows a normal EF of 55-60% and no wall motion abnormalities. The patient also has a complex surgical history: She is status post left hemicolectomy with left transverse  colon to sigmoid colon anastomosis for diverticulitis, umbilical hernia repair, repair of incarcerated ventral incisional hernia in 2009, open abdominal hysterectomy in 2013, abdominal wound exploration of enterocutaneous fistula and abscess in 04/28/2015 by Dr.Tsuei. Patient has had an ostomy that as high output. Patient was brought to the emergency department due to increased confusion: History was obtained from her daughter. The patient was unable to recognize her daughter on the morning of admission. She was admitted for further workup.   Hospital Course:   Acute encephalopathy: Resolved, patient back to baseline. Alert and oriented, follows all commands, CT head negative for any CVA or any acute intracranial abnormality  Chronic enterocutaneuos fistula: s/p Eakin pouch, difficult seal - Significant improvement in the wound. Patient's Eakin pouch was placed to intermittent wall suction and was very closely followed by wound care and general surgery. The intermittent wall suction has been discontinued. Please continue wound care per instructions above. - Continue clear liquid diet and TPN, sandostatin started on 9/26 per surgery (no changes until further management recommended by general surgery on follow-up appointment on 06/26/15 by Dr Gershon Crane)  Acute on chronic anemia, (h/o transfusion reaction per chart, but daughter denies) - Patient received 2 units of packed asked PC transfusion, hemoglobin 8.2 at the time of discharge. Stool occult test on 10/2 was negative - Patient had a colonoscopy done in 4/16 (Dr Collene Mares) had shown pan diverticulosis. EGD 01/13/15, Dr. Benson Norway, showed hemorrhagic gastritis, hiatal hernia. Had recommended PPI and transfuse as needed  Acute renal failure on chronic kidney disease, stage II, metabolic acidosislikely from dehydration, possibly due to volume depletion with ischemic ATN, UA negative for any UTI - Creatinine stable at 1.7   Essential hypertension  -  Stable  H/o NIDDM2: last A1c 5.7 in 03/2015, - Continue sliding scale insulin  H/o gout, pain in the right index finger: Improving - stable, colchicine held due to ARF, - Uric acid 7.5, had started on allopurinol   Adult failure to thrive - will need SNF   Day of Discharge BP 119/60 mmHg  Pulse 84  Temp(Src) 98.9 F (37.2 C) (Oral)  Resp 20  Ht 5\' 4"  (1.626 m)  Wt 102.6 kg (226 lb 3.1 oz)  BMI 38.81 kg/m2  SpO2 100%  Physical Exam: General: Alert and awake oriented x3 not in any acute distress. HEENT: anicteric sclera, pupils reactive to light and accommodation CVS: S1-S2 clear no murmur rubs or gallops Chest: clear to auscultation bilaterally, no wheezing rales or rhonchi Abdomen: soft eakin's pouch in place  ND, NBS Extremities: no cyanosis, clubbing or edema  noted bilaterally Neuro: Cranial nerves II-XII intact, no focal neurological deficits   The results of significant diagnostics from this hospitalization (including imaging, microbiology, ancillary and laboratory) are listed below for reference.    LAB RESULTS: Basic Metabolic Panel:  Recent Labs Lab 06/14/15 0550 06/15/15 0600  NA 139 136  K 4.0 3.7  CL 112* 109  CO2 22 20*  GLUCOSE 132* 113*  BUN 63* 62*  CREATININE 1.77* 1.72*  CALCIUM 8.5* 8.4*  MG  --  1.5*  PHOS  --  3.1   Liver Function Tests:  Recent Labs Lab 06/11/15 0558 06/15/15 0600  AST 36 54*  ALT 50 79*  ALKPHOS 106 151*  BILITOT 0.3 0.7  PROT 5.5* 5.8*  ALBUMIN 1.9* 1.8*   No results for input(s): LIPASE, AMYLASE in the last 168 hours. No results for input(s): AMMONIA in the last 168 hours. CBC:  Recent Labs Lab 06/14/15 0550 06/15/15 0600  WBC 8.8 9.6  NEUTROABS  --  7.7  HGB 5.9* 8.2*  HCT 19.1* 25.4*  MCV 72.3* 75.4*  PLT 155 196   Cardiac Enzymes: No results for input(s): CKTOTAL, CKMB, CKMBINDEX, TROPONINI in the last 168 hours. BNP: Invalid input(s): POCBNP CBG:  Recent Labs Lab 06/15/15 0745  06/15/15 1131  GLUCAP 89 135*    Significant Diagnostic Studies:  Ct Abdomen Pelvis Wo Contrast  06/04/2015   CLINICAL DATA:  74 year old female with acute right abdominal and flank pain, nausea and vomiting for 1 week.  EXAM: CT ABDOMEN AND PELVIS WITHOUT CONTRAST  TECHNIQUE: Multidetector CT imaging of the abdomen and pelvis was performed following the standard protocol without IV contrast.  COMPARISON:  05/06/2015 and prior CTs  FINDINGS: Please note that parenchymal abnormalities may be missed without intravenous contrast.  Lower chest:  Coronary artery calcifications again identified.  Hepatobiliary: The liver is unremarkable. Patient is status post cholecystectomy. There is no evidence biliary dilatation.  Pancreas: Unremarkable  Spleen: Unremarkable  Adrenals/Urinary Tract: Mild bilateral renal atrophy again identified. The kidneys, adrenal glands and bladder are otherwise unremarkable. There is no evidence of hydronephrosis or urinary calculi.  Stomach/Bowel: A small hiatal hernia is again noted. There is no evidence of bowel obstruction or focal bowel wall thickening. Descending and sigmoid colonic diverticulosis noted without evidence of diverticulitis.  Vascular/Lymphatic: Aortic atherosclerotic calcifications noted without aneurysm. No enlarged lymph nodes are identified.  Reproductive: The patient is status post hysterectomy. The adnexal regions are unremarkable.  Other: No free fluid or pneumoperitoneum. Periumbilical surgical changes are identified. There is no evidence of ventral hernia.  Musculoskeletal: No acute abnormalities identified. Multilevel mild degenerative disc disease and moderate facet arthropathy in the lumbar spine noted.  IMPRESSION: No evidence of acute abnormality. No evidence of urinary calculi or hydronephrosis.  Small hiatal hernia and mild bilateral renal atrophy again noted.  Multilevel degenerative changes within the lumbar spine.  Coronary artery disease and aortic  atherosclerosis.   Electronically Signed   By: Margarette Canada M.D.   On: 06/04/2015 12:09   Dg Chest Port 1 View  06/04/2015   CLINICAL DATA:  Sepsis.  Low-grade fever.  EXAM: PORTABLE CHEST 1 VIEW  COMPARISON:  04/27/2015  FINDINGS: Right PICC line tip:  SVC.  Atherosclerotic aortic arch. Thoracic spondylosis. Upper normal heart size.  The lungs appear clear.  IMPRESSION: 1. Atherosclerosis. 2. Right PICC line tip:  SVC.   Electronically Signed   By: Van Clines M.D.   On: 06/04/2015 10:55    2D ECHO:  Disposition and Follow-up:     Discharge Instructions    Discharge instructions    Complete by:  As directed   Diet: clear liquid diet TPN at night, cyclical. Continue until discontinued by surgery.  Wound care:  If Eakin pouch begins to leak, change pouch.  Use barrier rings cut in 1/2 to build up distal edge of wound and fill dips in skin to aid in prevention of leakage. Use ostomy powder to sprinkle on denuded skin distal abdomen, brush away excess, tap with damp washcloth and reapply 2-3 layers. Cover with dry gauze areas that are not within the pouch.     Increase activity slowly    Complete by:  As directed             DISPOSITION: Skilled nursing facility   DISCHARGE FOLLOW-UP Follow-up Information    Follow up with Maia Petties., MD On 06/26/2015.   Specialty:  General Surgery   Why:  arrive by 10:20AM for a 10:40AM checkup with your surgeon   Contact information:   [address] [place] [address] [phone]       Follow up with [name], MD. Schedule an appointment as soon as possible for a visit in 2 weeks.   Specialty:  Internal Medicine   Why:  for hospital follow-up   Contact information:   [address] [place] [address] [phone]        Time spent on Discharge: 28mins   Signed:   Rain Friedt M.D. [facility] 06/15/2015, 12:04 PM Pager: [phone]

## 2015-06-15 NOTE — Progress Notes (Signed)
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  No Known Allergies  Patient Measurements: Height: 5\' 4"  (162.6 cm) Weight: 226 lb 3.1 oz (102.6 kg) IBW/kg (Calculated) : 54.7  Vital Signs: Temp: 98.7 F (37.1 C) (10/03 0400) Temp Source: Oral (10/03 0400) BP: 114/54 mmHg (10/03 0400) Pulse Rate: 85 (10/03 0400) Intake/Output from previous day: 10/02 0701 - 10/03 0700 In: 1760 [P.O.:240; I.V.:850; Blood:670] Out: 8657 [Urine:3200; Drains:10]  Labs:  Recent Labs  06/14/15 0550 06/15/15 0600  WBC 8.8 9.6  HGB 5.9* 8.2*  HCT 19.1* 25.4*  PLT 155 PENDING     Recent Labs  06/13/15 0610 06/14/15 0550 06/15/15 0600  NA 139 139 136  K 3.6 4.0 3.7  CL 112* 112* 109  CO2 21* 22 20*  GLUCOSE 149* 132* 113*  BUN 61* 63* 62*  CREATININE 1.79* 1.77* 1.72*  CALCIUM 8.3* 8.5* 8.4*  MG  --   --  1.5*  PHOS  --   --  3.1  PROT  --   --  5.8*  ALBUMIN  --   --  1.8*  AST  --   --  54*  ALT  --   --  79*  ALKPHOS  --   --  151*  BILITOT  --   --  0.7  PREALBUMIN  --   --  13.5*  TRIG  --   --  49   Estimated Creatinine Clearance: 33.5 mL/min (by C-G formula based on Cr of 1.72).    Recent Labs  06/14/15 2001 06/14/15 2359 06/15/15 0404  GLUCAP 162* 165* 186*    Insulin Requirements in the past 24 hours:  7 units of SSI  Assessment: 17 YOF with an extensive surgical history to continue on TPN for EC fistula. CT on admit shows no acute abnormality.  Admit: AMS, abd pain  Current Nutrition:  Clear liquid diet Alternate cyclic Clinimix E 8/46 and 5/15. Infuse 1874mL over 12 hrs:  50 ml/hr x 1 hr, then 170 ml/hr x 10 hrs, then 50 ml/hr x 1 hr IVFE 20% at 104ml/hr x 12 hrs TPN + IVFE provides 1758 kcal and 90g of protein, meeting 100% of kcal needs and 90% of protein needs)  Goal protein for CRF is ~0.8 g/kg/d, currently providing 0.88 g/kg/d.  Home TPN: Cyclic compounded 3:1 TPN with  electrolytes over 12 hours at home = 1900 kCal and 90g protein per day No insulin  Nutrition Goals: 1700-1900 kCal and 100-110 gm protein per day  Surgeries/Procedures: hx left hemicolectomy with left transverse colon to sigmoid colon anastomosis for diverticulitis, umbilical hernia repair, repair of incarcerated ventral incisional hernia in 2009, open abd hysterectomy in 2013, abdominal wound exploration of EC fistula and abscess in 04/28/2015  GI: GERD / esophagitis / diverticulitis / occasional bowel incontinence / high output EC fistula. Fistula with bilious output but only 10 ml yesterday.  Albumin low at 1.8. Prealbumin down to 13.5 from 23.8. Eakin's pouch to suction - drainage not recorded. Octreotide started 9/26, PPI IV, +BM on 10/2. Started on clear liquid diet on 10/1. Pt has poor appetite and only taking about 25% of intake.Still with some abdominal pain.  Endo: gout (colchicine held d/t AKI, MD considering Uloric) - c/o finger pain, uric acid elevated slightly 7.5. DM2 - no insulin in home TPN. CBGs low-nl (70-130) when TPN off but elevated (160-180) when on TPN. Will not add insulin to TPN  due to concern that adding insulin to control the elevated value will likely lead to hypoglycemia when off TPN.    Lytes: Stable with alternating TPN with and without electrolytes.  CoCa wnl at 10.1. Phos ok and Mg low at 1.5. LYTES added in TPN today  Renal: CKD3 - SCr stable at 1.72 (baseline 1.23, early Sept was 1.45), BUN improved to 62, UOP fluctuating - 1.34ml/kg/hr yesterday.  Pulm: OSA on CPAP - stable on RA  Cards: HTN / HLD / CAD / CHF (EF 55-60%) - BP and HR controlled  Hepatobil: LFTs slightly elevated. TG wnl.  HEME:  Hx. of anemia, now with H/H of 5.9/19.1 - will likely need transfusion   Best Practices: Lovenox  TPN Access: PICC line placed 04/27/15  TPN start date: Chronic  Plan:  Continue to alternate Clinimix E 5/15 and Clinimix 5/15 (without electrolytes) Infuse  185mL over 12 hrs:  50 ml/hr x 1 hr, then 170 ml/hr x 10 hrs, then 50 ml/hr x 1 hr.  Continue IVFE at 20 ml/hr x 12 hrs TPN + IVFE provides 1758 kCal and 90 gm protein daily, meeting 100% of kCal and 90% of minimal protein needs Continue MVI and trace elements in TPN Increase SSI to moderate and adjust as needed Monitor TPN labs, Bmet and Mg in am  Give Mg 1g IV x 1  Elenor Quinones, PharmD Clinical Pharmacist Pager [phone] 06/15/2015 7:37 AM

## 2015-06-15 NOTE — Care Management Note (Signed)
Case Management Note  Patient Details  Name: Marissa [name] MRN: [id number] Date of Birth: [birth year]  Subjective/Objective:                 CM following for progression and d/c planning.   Action/Plan: Informed by pt insurance provider that they will not support LTAC, however per their medical director this pt may d/c to SNF for ongoing care. CSW, [employer] informed. This CM was also informed of this by [employer] rep B Nehemiah Massed.  Expected Discharge Date:                  Expected Discharge Plan:  Skilled Nursing Facility  In-House Referral:  Clinical Social Work  Discharge planning Services  NA  Post Acute Care Choice:  NA Choice offered to:  NA  DME Arranged:    DME Agency:     HH Arranged:    [place] Agency:     Status of Service:  Completed, signed off  Medicare Important Message Given:  Yes-third notification given Date Medicare IM Given:    Medicare IM give by:    Date Additional Medicare IM Given:    Additional Medicare Important Message give by:     If discussed at [place] of Stay Meetings, dates discussed:    Additional Comments:  Adron Bene, RN 06/15/2015, 11:11 AM

## 2015-06-15 NOTE — Consult Note (Signed)
WOC wound follow up Current fistula pouch intact since Friday without leakage. Decreased amt green drainage in cannister from wall suction. Cleansed all the skin and cut a small Eakin pouch to fit the perimeter of the wound, she has one fistula that is actively draining green effluent at 12 o'clock.Wound is full thickness;100% beefy red with mod amt green drainage, no odor, bleeds easily when touched. Lower abd with partial thickness skin loss improved, red and moist with small amt in patchy area. Built up the midline wound just distal to the edge of the perimeter of the Eakin pouch with several strips of eakin barrier to create a dam. Placed pouch to [facility] with suction catheter to allow for the pouch to stay continuously drained and allow the skin some time to heal while she is in the hospital. Less pain with dressing changes, no bleeding. Daughter at bedside to assess wounds and states they are greatly improved. Supplies at bedside and instructions provided for bedside nurses. Pt will not need suction when discharged to another facility related to decreased amt drainage. Julien Girt MSN, RN, [place], [place], [place]

## 2015-06-15 NOTE — Progress Notes (Signed)
Occupational Therapy Treatment Patient Details Name: Shonice Wrisley MRN: [id number] DOB: 10-27-1940 Today's Date: 06/15/2015    History of present illness Patient is a 74 yo female admitted 06/04/15 with AMS, abdominal pain, dehydration, weakness, abdominal wounds.  PMH:  HTN, arthritis, CAD, DM, neuropathy, CHF, CKD, ostomy   OT comments  Patient making slow, steady progress towards OT goals, will continue plan of care for now. Engaged pt in [place] for functional strengthening of UEs and focus on pt following commands. Encouraged elevation of RUE secondary to increased swelling.    Follow Up Recommendations  SNF;Supervision/Assistance - 24 hour    Equipment Recommendations  Other (comment) (TBD next venue of care)    Recommendations for Other Services  None at this time   Precautions / Restrictions Precautions Precautions: Fall Precaution Comments: drain from abdominal wound Restrictions Weight Bearing Restrictions: No        ADL Overall ADL's : Needs assistance/impaired General ADL Comments: Pt continues to be total A for all BADLs at any position level. Focused on pt following commands and BUE HEP.      Cognition   Behavior During Therapy: WFL for tasks assessed/performed;Flat affect Overall Cognitive Status: Within Functional Limits for tasks assessed      Exercises General Exercises - Upper Extremity Shoulder Flexion: AAROM;Both;10 reps;Supine Shoulder Extension: AAROM;Both;10 reps;Supine Elbow Flexion: AAROM;Both;10 reps;Supine Elbow Extension: AAROM;Both;10 reps;Supine Wrist Flexion: AAROM;Both;Supine;10 reps Wrist Extension: AAROM;Both;10 reps;Supine Digit Composite Flexion: AAROM;AROM;Both;10 reps;Supine           Pertinent Vitals/ Pain       Pain Assessment: Faces Faces Pain Scale: Hurts whole lot Pain Location: bilateral feet (left worse than right) and index finger on right hand Pain Descriptors / Indicators: Grimacing;Guarding Pain Intervention(s):  Limited activity within patient's tolerance;Monitored during session;Repositioned   Frequency Min 2X/week     Progress Toward Goals  OT Goals(current goals can now befound in the care plan section)  Progress towards OT goals: Progressing toward goals     Plan Discharge plan remains appropriate    Activity Tolerance Patient limited by pain  Patient Left in bed;with call bell/phone within reach;with nursing/sitter in room;with bed alarm set  Nurse Communication Other (comment) (needs assistance with lunch)     Time: 4585-9292 OT Time Calculation (min): 20 min  Charges: OT General Charges $OT Visit: 1 Procedure OT Treatments $Therapeutic Exercise: 8-22 mins  Jatavian Calica , MS, OTR/L, CLT Pager: [phone]  06/15/2015, 12:51 PM

## 2015-06-15 NOTE — Care Management Important Message (Signed)
Important Message  Patient Details  Name: Marissa [name] MRN: [id number] Date of Birth: 1941-07-29   Medicare Important Message Given:  Yes-fourth notification given    Delorse Lek 06/15/2015, 12:42 PM

## 2015-06-16 LAB — BASIC METABOLIC PANEL
ANION GAP: 5 (ref 5–15)
BUN: 60 mg/dL — AB (ref 6–20)
CO2: 21 mmol/L — ABNORMAL LOW (ref 22–32)
Calcium: 8.4 mg/dL — ABNORMAL LOW (ref 8.9–10.3)
Chloride: 108 mmol/L (ref 101–111)
Creatinine, Ser: 1.75 mg/dL — ABNORMAL HIGH (ref 0.44–1.00)
GFR calc Af Amer: 32 mL/min — ABNORMAL LOW (ref >60)
GFR, EST NON AFRICAN AMERICAN: 28 mL/min — AB (ref >60)
Glucose, Bld: 123 mg/dL — ABNORMAL HIGH (ref 65–99)
POTASSIUM: 4 mmol/L (ref 3.5–5.1)
SODIUM: 134 mmol/L — AB (ref 135–145)

## 2015-06-16 LAB — GLUCOSE, CAPILLARY
GLUCOSE-CAPILLARY: 206 mg/dL — AB (ref 65–99)
GLUCOSE-CAPILLARY: 117 mg/dL — AB (ref 65–99)
Glucose-Capillary: 166 mg/dL — ABNORMAL HIGH (ref 65–99)
Glucose-Capillary: 53 mg/dL — ABNORMAL LOW (ref 65–99)
Glucose-Capillary: 84 mg/dL (ref 65–99)

## 2015-06-16 LAB — CBC
HCT: 25.1 % — ABNORMAL LOW (ref 36.0–46.0)
Hemoglobin: 8.1 g/dL — ABNORMAL LOW (ref 12.0–15.0)
MCH: 24.5 pg — AB (ref 26.0–34.0)
MCHC: 32.3 g/dL (ref 30.0–36.0)
MCV: 76.1 fL — ABNORMAL LOW (ref 78.0–100.0)
PLATELETS: 157 10*3/uL (ref 150–400)
RBC: 3.3 MIL/uL — AB (ref 3.87–5.11)
RDW: 16.3 % — ABNORMAL HIGH (ref 11.5–15.5)
WBC: 9.9 10*3/uL (ref 4.0–10.5)

## 2015-06-16 LAB — MAGNESIUM: MAGNESIUM: 1.9 mg/dL (ref 1.7–2.4)

## 2015-06-16 MED ORDER — INSULIN ASPART 100 UNIT/ML ~~LOC~~ SOLN: 0.0000 [IU] | Freq: Three times a day (TID) | SUBCUTANEOUS | Status: DC

## 2015-06-16 NOTE — Progress Notes (Signed)
[facility]                                                                              Patient Demographics  Marissa [name], is a 74 y.o. female, DOB - [birth year], HBZ:[id number]  Admit date - 06/04/2015   Admitting Physician Javani Spratt Krystal Eaton, MD  Outpatient Primary MD for the patient is [name], MD  LOS - 12   Chief Complaint  Patient presents with  . Abdominal Pain  . Emesis  . Weakness  . Altered Mental Status       Brief HPI   Marissa [name] is a 74 y.o. female With hypertension, hyperlipidemia, GERD, coronary artery disease, stage III chronic kidney disease, history of heart failure, although last echocardiogram on 07/02/14 shows a normal EF of 55-60% and no wall motion abnormalities. The patient also has a complex surgical history: She is status post left hemicolectomy with left transverse colon to sigmoid colon anastomosis for diverticulitis, umbilical hernia repair, repair of incarcerated ventral incisional hernia in 2009, open abdominal hysterectomy in 2013, abdominal wound exploration of enterocutaneous fistula and abscess in 04/28/2015 by Dr.Tsuei. Patient has had an ostomy that as high output. Patient was brought to the emergency department due to increased confusion: History was obtained from her daughter. The patient was unable to recognize her daughter on the morning of admission. She was admitted for further workup.   Assessment & Plan   Principal problem Chronic enterocutaneuos fistula: s/p Eakin pouch, difficult seal - Gen. surgery following, management per wound care and general surgery - Continue clear liquid diet and TPN - sandostatin started on 9/26 per surgery - DC held yesterday as TPN was not available at the facility  Active problems  Acute on chronic anemia, (h/o transfusion reaction per chart, but daughter denies) - Hemoglobin stable at 8.1 status post 2 units transfusion on 10/2 - Stool occult test negative   Acute renal  failure on chronic kidney disease, stage IIlikely from dehydration, possibly due to volume depletion with ischemic ATN, UA negative for any UTI - Creatinine stable at 1.7   Acute encephalopathy appears to be close to baseline Alert and oriented, follows all verbal commands. CT head showed no acute findings.  Metabolic acidosis:  Bicarbonate is improving  Essential hypertension  - Continue IV Lopressor as needed  H/o NIDDM2: last A1c 5.7 in 03/2015, - Continue sliding scale insulin  H/o gout, pain in the right index finger: Improving - stable, colchicine held due to ARF, - Uric acid 7.5, had started on allopurinol yesterday  Adult failure to thrive - will need SNF , unfortunately was declined by LTAC  Code Status: full code  Family Communication: Discussed in detail with the patient, all imaging results, lab results explained to the patient . Discussed in detail with patient's daughter yesterday 10/3  Disposition Plan: DC to skilled nursing facility today if TPN available  Time Spent in minutes  15 minutes  Procedures  Chronic TPN   Consults   Nephrology   general surgery  DVT Prophylaxis  Lovenox   Medications  Scheduled Meds: . allopurinol  150 mg Oral Daily  . enoxaparin (  LOVENOX) injection  40 mg Subcutaneous Q24H  . insulin aspart  0-15 Units Subcutaneous 6 times per day  . nystatin  5 mL Oral QID  . octreotide  100 mcg Subcutaneous Q12H  . pantoprazole  40 mg Oral Q supper   Continuous Infusions: . dextrose 5 % and 0.45% NaCl 50 mL/hr at 06/15/15 1744   PRN Meds:.acetaminophen **OR** acetaminophen, alum & mag hydroxide-simeth, methocarbamol, metoprolol, morphine injection, nitroGLYCERIN, ondansetron **OR** ondansetron (ZOFRAN) IV, sodium chloride   Antibiotics   Anti-infectives    None        Subjective:   Marissa [name] was seen and examined today. Denies any specific complaints. Alert and oriented, no fevers or chills. No acute events  overnight. No nausea, vomiting or any hematemesis, hematochezia or melena   Objective:   Blood pressure 130/64, pulse 77, temperature 97.5 F (36.4 C), temperature source Oral, resp. rate 18, height 5\' 4"  (1.626 m), weight 102.7 kg (226 lb 6.6 oz), SpO2 100 %.  Wt Readings from Last 3 Encounters:  06/15/15 102.7 kg (226 lb 6.6 oz)  05/01/15 99.3 kg (218 lb 14.7 oz)  04/15/15 99.338 kg (219 lb)     Intake/Output Summary (Last 24 hours) at 06/16/15 1137 Last data filed at 06/16/15 0900  Gross per 24 hour  Intake 41098.15 ml  Output   2526 ml  Net 38572.15 ml    Exam  General: Alert and  awake NAD  HEENT:  PERRLA, EOMI  Neck: Supple, no JVD, no masses  CVS: S1 S2 clear, RRR  Respiratory: CTAB  Abdomen: eakin's pouch in place, ND, normal bowel sounds  Extremities : no cyanosis clubbing or edema  Neuro: no new deficits  Skin: No rashes  Psych: oriented 2, normal affect and demeanor   Data Review   Micro Results No results found for this or any previous visit (from the past 240 hour(s)).  Radiology Reports Ct Abdomen Pelvis Wo Contrast  06/04/2015   CLINICAL DATA:  74 year old female with acute right abdominal and flank pain, nausea and vomiting for 1 week.  EXAM: CT ABDOMEN AND PELVIS WITHOUT CONTRAST  TECHNIQUE: Multidetector CT imaging of the abdomen and pelvis was performed following the standard protocol without IV contrast.  COMPARISON:  05/06/2015 and prior CTs  FINDINGS: Please note that parenchymal abnormalities may be missed without intravenous contrast.  Lower chest:  Coronary artery calcifications again identified.  Hepatobiliary: The liver is unremarkable. Patient is status post cholecystectomy. There is no evidence biliary dilatation.  Pancreas: Unremarkable  Spleen: Unremarkable  Adrenals/Urinary Tract: Mild bilateral renal atrophy again identified. The kidneys, adrenal glands and bladder are otherwise unremarkable. There is no evidence of hydronephrosis  or urinary calculi.  Stomach/Bowel: A small hiatal hernia is again noted. There is no evidence of bowel obstruction or focal bowel wall thickening. Descending and sigmoid colonic diverticulosis noted without evidence of diverticulitis.  Vascular/Lymphatic: Aortic atherosclerotic calcifications noted without aneurysm. No enlarged lymph nodes are identified.  Reproductive: The patient is status post hysterectomy. The adnexal regions are unremarkable.  Other: No free fluid or pneumoperitoneum. Periumbilical surgical changes are identified. There is no evidence of ventral hernia.  Musculoskeletal: No acute abnormalities identified. Multilevel mild degenerative disc disease and moderate facet arthropathy in the lumbar spine noted.  IMPRESSION: No evidence of acute abnormality. No evidence of urinary calculi or hydronephrosis.  Small hiatal hernia and mild bilateral renal atrophy again noted.  Multilevel degenerative changes within the lumbar spine.  Coronary artery disease and aortic atherosclerosis.  Electronically Signed   By: Margarette Canada M.D.   On: 06/04/2015 12:09   Ct Head Wo Contrast  06/06/2015   CLINICAL DATA:  Abnormal speech.  EXAM: CT HEAD WITHOUT CONTRAST  TECHNIQUE: Contiguous axial images were obtained from the base of the skull through the vertex without intravenous contrast.  COMPARISON:  12/28/2014  FINDINGS: Mild generalized cerebral atrophy is normal for age. There is no evidence of acute cortical infarct, intracranial hemorrhage, mass, midline shift, or extra-axial fluid collection. Periventricular white-matter hypodensities are unchanged and nonspecific but compatible with mild chronic small vessel ischemic disease.  Orbits are unremarkable. Visualized paranasal sinuses and mastoid air cells are clear. Mild atherosclerotic calcification is noted at the skullbase.  IMPRESSION: No evidence of acute intracranial abnormality.   Electronically Signed   By: Logan Bores M.D.   On: 06/06/2015 13:22    US Renal  06/08/2015   CLINICAL DATA:  Elevated creatinine  EXAM: RENAL / URINARY TRACT ULTRASOUND COMPLETE  COMPARISON:  CT from 4 days ago  FINDINGS: Right Kidney:  Length: 10 cm. 12 mm interpolar cyst. Lobulated renal cortex with thinning. No hydronephrosis. No evidence of solid mass.  Left Kidney:  Length: 10 cm. Lobulated renal cortex with thinning. No hydronephrosis. No evidence of mass.  Bladder:  Appears normal for degree of bladder distention.  IMPRESSION: 1. No hydronephrosis. 2. Renal atrophy.   Electronically Signed   By: Monte Fantasia M.D.   On: 06/08/2015 21:35   Dg Chest Port 1 View  06/04/2015   CLINICAL DATA:  Sepsis.  Low-grade fever.  EXAM: PORTABLE CHEST 1 VIEW  COMPARISON:  04/27/2015  FINDINGS: Right PICC line tip:  SVC.  Atherosclerotic aortic arch. Thoracic spondylosis. Upper normal heart size.  The lungs appear clear.  IMPRESSION: 1. Atherosclerosis. 2. Right PICC line tip:  SVC.   Electronically Signed   By: Van Clines M.D.   On: 06/04/2015 10:55    CBC  Recent Labs Lab 06/14/15 0550 06/15/15 0600 06/16/15 0525  WBC 8.8 9.6 9.9  HGB 5.9* 8.2* 8.1*  HCT 19.1* 25.4* 25.1*  PLT 155 196 157  MCV 72.3* 75.4* 76.1*  MCH 22.3* 24.3* 24.5*  MCHC 30.9 32.3 32.3  RDW 15.6* 15.7* 16.3*  LYMPHSABS  --  1.1  --   MONOABS  --  0.5  --   EOSABS  --  0.2  --   BASOSABS  --  0.0  --     Chemistries   Recent Labs Lab 06/11/15 0558 06/12/15 0430 06/13/15 0610 06/14/15 0550 06/15/15 0600 06/16/15 0525  NA 144 146* 139 139 136 134*  K 3.6 4.2 3.6 4.0 3.7 4.0  CL 114* 118* 112* 112* 109 108  CO2 23 22 21* 22 20* 21*  GLUCOSE 133* 188* 149* 132* 113* 123*  BUN 79* 72* 61* 63* 62* 60*  CREATININE 1.75* 1.92* 1.79* 1.77* 1.72* 1.75*  CALCIUM 8.1* 8.5* 8.3* 8.5* 8.4* 8.4*  MG 1.9  --   --   --  1.5* 1.9  AST 36  --   --   --  54*  --   ALT 50  --   --   --  79*  --   ALKPHOS 106  --   --   --  151*  --   BILITOT 0.3  --   --   --  0.7  --     ------------------------------------------------------------------------------------------------------------------ estimated creatinine clearance is 32.9 mL/min (by C-G formula based on Cr of  1.75). ------------------------------------------------------------------------------------------------------------------ No results for input(s): HGBA1C in the last 72 hours. ------------------------------------------------------------------------------------------------------------------  Recent Labs  06/15/15 0600  TRIG 49   ------------------------------------------------------------------------------------------------------------------ No results for input(s): TSH, T4TOTAL, T3FREE, THYROIDAB in the last 72 hours.  Invalid input(s): FREET3 ------------------------------------------------------------------------------------------------------------------ No results for input(s): VITAMINB12, FOLATE, FERRITIN, TIBC, IRON, RETICCTPCT in the last 72 hours.  Coagulation profile No results for input(s): INR, PROTIME in the last 168 hours.  No results for input(s): DDIMER in the last 72 hours.  Cardiac Enzymes No results for input(s): CKMB, TROPONINI, MYOGLOBIN in the last 168 hours.  Invalid input(s): CK ------------------------------------------------------------------------------------------------------------------ Invalid input(s): [place]  06/15/15 1131 06/15/15 1657 06/15/15 2004 06/15/15 2356 06/16/15 0418 06/16/15 0739  GLUCAP 135* 87 148* 166* 206* 66*     Kairie Vangieson M.D. [facility] 06/16/2015, 11:37 AM  Pager: [phone] Between 7am to 7pm - call Pager - [phone]  After 7pm go to [email or web address] - password [email or web address]  Call night coverage person covering after 7pm

## 2015-06-16 NOTE — Progress Notes (Signed)
Physical Therapy Treatment Patient Details Name: Lorin Hauck MRN: [id number] DOB: 1941-08-09 Today's Date: 06/16/2015    History of Present Illness Patient is a 74 yo female admitted 06/04/15 with AMS, abdominal pain, dehydration, weakness, abdominal wounds.  PMH:  HTN, arthritis, CAD, DM, neuropathy, CHF, CKD, ostomy    PT Comments    Noting much improved participation which translates into better activity tolerance, and able to work on standing;   Overall progressing well; Anticipate continuing good progress at post-acute rehabilitation, especially knowing that she was ambulatory in August   Follow Up Recommendations  LTACH;Other (comment) (May not have LTACH benefit; rec SNF for post-acute rehab to maximize independence and safety with mobility )     Equipment Recommendations  Wheelchair (measurements PT);Wheelchair cushion (measurements PT);Hospital bed    Recommendations for Other Services       Precautions / Restrictions Precautions Precautions: Fall Precaution Comments: drain from abdominal wound    Mobility  Bed Mobility Overal bed mobility: Needs Assistance Bed Mobility: Supine to Sit     Supine to sit: Mod assist     General bed mobility comments: Cues to initiate; pt did a good job semi-rolling to R with use of rails; min assist for help with positioning hips closer to EOB; light mod handheld assist LUE to pull to sit, noted good push with RUE  Transfers Overall transfer level: Needs assistance Equipment used: 2 person hand held assist (and gait belt) Transfers: Sit to/from Stand;Lateral/Scoot Transfers Sit to Stand: +2 physical assistance;Mod assist        Lateral/Scoot Transfers: Mod assist;Max assist;+2 physical assistance General transfer comment: attempted anterior weight shift for sit to stand or pivot transfer, achieved half-stand, but pt unable to tolerate weight on her feet, so used bed pad to assist with lateral scoot patient very motivated and  able to assist some with scooting onto recliner with armrest down.  Nurse tech arrived and steadied the chair during transition  Ambulation/Gait                 Stairs            Wheelchair Mobility    Modified Rankin (Stroke Patients Only)       Balance     Sitting balance-Leahy Scale: Fair       Standing balance-Leahy Scale: Zero                      Cognition Arousal/Alertness: Awake/alert Behavior During Therapy: WFL for tasks assessed/performed;Flat affect Overall Cognitive Status: Within Functional Limits for tasks assessed                      Exercises      General Comments        Pertinent Vitals/Pain Pain Assessment: Faces Faces Pain Scale: Hurts even more Pain Location: bil feet Left more than Right; subsided pretty quickly Pain Descriptors / Indicators: Grimacing Pain Intervention(s): Limited activity within patient's tolerance;Monitored during session;Repositioned    Home Living                      Prior Function            PT Goals (current goals can now be found in the care plan section) Acute Rehab PT Goals Patient Stated Goal: wants OOB PT Goal Formulation: Patient unable to participate in goal setting Time For Goal Achievement: 06/20/15 Potential to Achieve Goals: Fair Progress towards PT goals: Progressing toward goals  Frequency  Min 2X/week    PT Plan Current plan remains appropriate    Co-evaluation             End of Session Equipment Utilized During Treatment: Gait belt Activity Tolerance: Patient tolerated treatment well Patient left: in chair;with call bell/phone within reach     Time: 0953-1015 PT Time Calculation (min) (ACUTE ONLY): 22 min  Charges:  $Therapeutic Activity: 8-22 mins                    G Codes:      Roney Marion Hamff 06/16/2015, 11:50 AM  Roney Marion, [place] Pager [phone] Office [phone]

## 2015-06-16 NOTE — Progress Notes (Signed)
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  No Known Allergies  Patient Measurements: Height: 5\' 4"  (162.6 cm) Weight: 226 lb 6.6 oz (102.7 kg) IBW/kg (Calculated) : 54.7  Vital Signs: Temp: 98.8 F (37.1 C) (10/04 0423) Temp Source: Oral (10/04 0423) BP: 139/59 mmHg (10/04 0423) Pulse Rate: 88 (10/04 0423) Intake/Output from previous day: 10/03 0701 - 10/04 0700 In: 41118.2 [P.O.:60; I.V.:1165.8; OEV:03500.9] Out: 2826 [Urine:2825; Stool:1]  Labs:  Recent Labs  06/14/15 0550 06/15/15 0600 06/16/15 0525  WBC 8.8 9.6 9.9  HGB 5.9* 8.2* 8.1*  HCT 19.1* 25.4* 25.1*  PLT 155 196 157     Recent Labs  06/14/15 0550 06/15/15 0600 06/16/15 0525  NA 139 136 134*  K 4.0 3.7 4.0  CL 112* 109 108  CO2 22 20* 21*  GLUCOSE 132* 113* 123*  BUN 63* 62* 60*  CREATININE 1.77* 1.72* 1.75*  CALCIUM 8.5* 8.4* 8.4*  MG  --  1.5* 1.9  PHOS  --  3.1  --   PROT  --  5.8*  --   ALBUMIN  --  1.8*  --   AST  --  54*  --   ALT  --  79*  --   ALKPHOS  --  151*  --   BILITOT  --  0.7  --   PREALBUMIN  --  13.5*  --   TRIG  --  49  --    Estimated Creatinine Clearance: 32.9 mL/min (by C-G formula based on Cr of 1.75).    Recent Labs  06/15/15 2356 06/16/15 0418 06/16/15 0739  GLUCAP 166* 206* 53*    Insulin Requirements in the past 24 hours:  12 units of SSI  Assessment: 89 YOF with an extensive surgical history to continue on TPN for EC fistula. CT on admit shows no acute abnormality.  Admit: AMS, abd pain  Current Nutrition:  Clear liquid diet Alternate cyclic Clinimix E 3/81 and 5/15. Infuse 1851mL over 12 hrs:  50 ml/hr x 1 hr, then 170 ml/hr x 10 hrs, then 50 ml/hr x 1 hr IVFE 20% at 67ml/hr x 12 hrs TPN + IVFE provides 1758 kcal and 90g of protein, meeting 100% of kcal needs and 90% of protein needs)  Goal protein for CRF is ~0.8 g/kg/d, currently providing 0.88 g/kg/d.  Home  TPN: Cyclic compounded 3:1 TPN with electrolytes over 12 hours at home = 1900 kCal and 90g protein per day No insulin  Nutrition Goals: 1700-1900 kCal and 100-110 gm protein per day  Surgeries/Procedures: hx left hemicolectomy with left transverse colon to sigmoid colon anastomosis for diverticulitis, umbilical hernia repair, repair of incarcerated ventral incisional hernia in 2009, open abd hysterectomy in 2013, abdominal wound exploration of EC fistula and abscess in 04/28/2015  GI: GERD / esophagitis / diverticulitis / occasional bowel incontinence / high output EC fistula. Fistula with bilious output but only 10 ml yesterday.  Albumin low at 1.8. Prealbumin down to 13.5 from 23.8. Eakin's pouch to suction - drainage not recorded. Octreotide started 9/26, PPI IV, +BM on 10/2. Started on clear liquid diet on 10/1. Pt has poor appetite and only taking about 25% of intake.Still with some abdominal pain.  Endo: gout (colchicine held d/t AKI, MD considering Uloric) - c/o finger pain, uric acid elevated slightly 7.5. DM2 - no insulin in home TPN. CBGs low-nl (50-80s) when TPN off but elevated ([phone]) when on TPN. Will not  add insulin to TPN due to concern that adding insulin to control the elevated value will likely lead to hypoglycemia when off TPN.    Lytes: Stable with alternating TPN with and without electrolytes.  CoCa wnl at 10.1. Phos ok and Mg 1.9. No LYTES added in TPN today  Renal: CKD3 - SCr stable at 1.75 (baseline 1.23, early Sept was 1.45), BUN improved to 60, UOP fluctuating - 1.2ml/kg/hr yesterday.  Pulm: OSA on CPAP - stable on RA  Cards: HTN / HLD / CAD / CHF (EF 55-60%) - BP and HR controlled  Hepatobil: LFTs slightly elevated. TG wnl.  HEME:  Hx. of anemia, Hgb stable at 8.1, plts wnl s/p transfusion on 10/2  Best Practices: Lovenox  TPN Access: PICC line placed 04/27/15  TPN start date: Chronic  Plan:  Patient to be discharged to [facility] today  since they received the TPN.  Elenor Quinones, PharmD Clinical Pharmacist Pager [phone] 06/16/2015 8:13 AM

## 2015-06-16 NOTE — Clinical Social Work Placement (Signed)
   CLINICAL SOCIAL WORK PLACEMENT  NOTE  Date:  06/16/2015  Patient Details  Name: Marissa [name] MRN: [id number] Date of Birth: 05-24-1941  Clinical Social Work is seeking post-discharge placement for this patient at the [place] level of care (*CSW will initial, date and re-position this form in  chart as items are completed):  Yes   Patient/family provided with [facility] Work Department's list of facilities offering this level of care within the geographic area requested by the patient (or if unable, by the patient's family).  Yes   Patient/family informed of their freedom to choose among providers that offer the needed level of care, that participate in Medicare, Medicaid or managed care program needed by the patient, have an available bed and are willing to accept the patient.  Yes   Patient/family informed of [facility]'s ownership interest in [facility] and [facility], as well as of the fact that they are under no obligation to receive care at these facilities.  PASRR submitted to EDS on       PASRR number received on       Existing PASRR number confirmed on 06/08/15     FL2 transmitted to all facilities in geographic area requested by pt/family on       FL2 transmitted to all facilities within larger geographic area on       Patient informed that his/her managed care company has contracts with or will negotiate with certain facilities, including the following:   [facility])      YES - Patient/family informed of bed offers received.  Patient chooses bed at  [facility]     Physician recommends and patient chooses bed at      Patient to be transferred to  [facility]  on  06/16/15.  Patient to be transferred to facility by  ambulance     Patient family notified on  06/16/15 of transfer.  Name of family member notified:   Daughter [name]     PHYSICIAN Please prepare priority discharge summary, including  medications, Please sign FL2, Please prepare prescriptions     Additional Comment:    _______________________________________________ Sable Feil, LCSW 06/16/2015, 6:50 PM

## 2015-06-16 NOTE — Progress Notes (Signed)
Patient ID: Marissa [name], female   DOB: [birth year], 74 y.o.   MRN: [id number]     [facility]      [address] [place], [place] [address]    Phone: [phone] FAX: [phone]     Subjective: No output. Eating clears, but little appetite.    Objective:  Vital signs:  Filed Vitals:   06/15/15 0923 06/15/15 1736 06/15/15 2007 06/16/15 0423  BP: 119/60 128/55 137/56 139/59  Pulse: 84 75 78 88  Temp: 98.9 F (37.2 C) 98.8 F (37.1 C) 98.5 F (36.9 C) 98.8 F (37.1 C)  TempSrc: Oral Oral Oral Oral  Resp: $Remo'20 20 18 17  'ggqNb$ Height:      Weight:   102.7 kg (226 lb 6.6 oz)   SpO2: 100% 100% 100% 100%    Last BM Date: 06/14/15  Intake/Output   Yesterday:  10/03 0701 - 10/04 0700 In: 65681.2 [P.O.:60; I.V.:1165.8; XNT:70017.4] Out: 2826 [Urine:2825; Stool:1] This shift:    I/O last 3 completed shifts: In: 41707.3 [P.O.:60; I.V.:1450; Blood:305] Out: 4726 [Urine:4725; Stool:1]     Physical Exam: General: Pt awake/alert/oriented x4 in no acute distress  Abdomen: Soft.  Nondistended.  Midline wound---eakin's pouch in place, no output.   No evidence of peritonitis.  No incarcerated hernias.    Problem List:   Active Problems:   Dehydration   Encephalopathy   Abdominal pain   Acute renal failure ([place])    Results:   Labs: Results for orders placed or performed during the hospital encounter of 06/04/15 (from the past 48 hour(s))  Type and screen     Status: None   Collection Time: 06/14/15 10:40 AM  Result Value Ref Range   ABO/RH(D) B POS    Antibody Screen NEG    Sample Expiration 06/17/2015    Unit Number [id number]    Blood Component Type RED CELLS,LR    Unit division 00    Status of Unit DISCARDED    Transfusion Status OK TO TRANSFUSE    Crossmatch Result Compatible    Unit Number [id number]    Blood Component Type RED CELLS,LR    Unit division 00    Status of Unit ISSUED,FINAL    Transfusion  Status OK TO TRANSFUSE    Crossmatch Result Compatible    Unit Number [id number]    Blood Component Type RED CELLS,LR    Unit division 00    Status of Unit ISSUED,FINAL    Transfusion Status OK TO TRANSFUSE    Crossmatch Result Compatible   Prepare RBC     Status: None   Collection Time: 06/14/15 10:40 AM  Result Value Ref Range   Order Confirmation ORDER PROCESSED BY BLOOD BANK   Glucose, capillary     Status: Abnormal   Collection Time: 06/14/15 11:34 AM  Result Value Ref Range   Glucose-Capillary 131 (H) 65 - 99 mg/dL   Comment 1 Notify RN    Comment 2 Document in Chart   Occult blood card to lab, stool RN will collect     Status: None   Collection Time: 06/14/15  3:40 PM  Result Value Ref Range   Fecal Occult Bld NEGATIVE NEGATIVE  Glucose, capillary     Status: None   Collection Time: 06/14/15  4:40 PM  Result Value Ref Range   Glucose-Capillary 97 65 - 99 mg/dL  Glucose, capillary     Status: Abnormal   Collection Time: 06/14/15  8:01 PM  Result  Value Ref Range   Glucose-Capillary 162 (H) 65 - 99 mg/dL  Glucose, capillary     Status: Abnormal   Collection Time: 06/14/15 11:59 PM  Result Value Ref Range   Glucose-Capillary 165 (H) 65 - 99 mg/dL  Glucose, capillary     Status: Abnormal   Collection Time: 06/15/15  4:04 AM  Result Value Ref Range   Glucose-Capillary 186 (H) 65 - 99 mg/dL  Comprehensive metabolic panel     Status: Abnormal   Collection Time: 06/15/15  6:00 AM  Result Value Ref Range   Sodium 136 135 - 145 mmol/L   Potassium 3.7 3.5 - 5.1 mmol/L   Chloride 109 101 - 111 mmol/L   CO2 20 (L) 22 - 32 mmol/L   Glucose, Bld 113 (H) 65 - 99 mg/dL   BUN 62 (H) 6 - 20 mg/dL   Creatinine, Ser 9.20 (H) 0.44 - 1.00 mg/dL   Calcium 8.4 (L) 8.9 - 10.3 mg/dL   Total Protein 5.8 (L) 6.5 - 8.1 g/dL   Albumin 1.8 (L) 3.5 - 5.0 g/dL   AST 54 (H) 15 - 41 U/L   ALT 79 (H) 14 - 54 U/L   Alkaline Phosphatase 151 (H) 38 - 126 U/L   Total Bilirubin 0.7 0.3 - 1.2  mg/dL   GFR calc non Af Amer 28 (L) >60 mL/min   GFR calc Af Amer 33 (L) >60 mL/min    Comment: (NOTE) The eGFR has been calculated using the CKD EPI equation. This calculation has not been validated in all clinical situations. eGFR's persistently <60 mL/min signify possible Chronic Kidney Disease.    Anion gap 7 5 - 15  Magnesium     Status: Abnormal   Collection Time: 06/15/15  6:00 AM  Result Value Ref Range   Magnesium 1.5 (L) 1.7 - 2.4 mg/dL  Phosphorus     Status: None   Collection Time: 06/15/15  6:00 AM  Result Value Ref Range   Phosphorus 3.1 2.5 - 4.6 mg/dL  Differential     Status: None   Collection Time: 06/15/15  6:00 AM  Result Value Ref Range   Neutrophils Relative % 80 %   Neutro Abs 7.7 1.7 - 7.7 K/uL   Lymphocytes Relative 12 %   Lymphs Abs 1.1 0.7 - 4.0 K/uL   Monocytes Relative 5 %   Monocytes Absolute 0.5 0.1 - 1.0 K/uL   Eosinophils Relative 2 %   Eosinophils Absolute 0.2 0.0 - 0.7 K/uL   Basophils Relative 0 %   Basophils Absolute 0.0 0.0 - 0.1 K/uL  Prealbumin     Status: Abnormal   Collection Time: 06/15/15  6:00 AM  Result Value Ref Range   Prealbumin 13.5 (L) 18 - 38 mg/dL  CBC     Status: Abnormal   Collection Time: 06/15/15  6:00 AM  Result Value Ref Range   WBC 9.6 4.0 - 10.5 K/uL   RBC 3.37 (L) 3.87 - 5.11 MIL/uL   Hemoglobin 8.2 (L) 12.0 - 15.0 g/dL    Comment: DELTA CHECK NOTED POST TRANSFUSION SPECIMEN    HCT 25.4 (L) 36.0 - 46.0 %   MCV 75.4 (L) 78.0 - 100.0 fL   MCH 24.3 (L) 26.0 - 34.0 pg   MCHC 32.3 30.0 - 36.0 g/dL   RDW 31.3 (H) 47.5 - 34.2 %   Platelets 196 150 - 400 K/uL    Comment: PLATELET COUNT CONFIRMED BY SMEAR REPEATED TO VERIFY   Triglycerides  Status: None   Collection Time: 06/15/15  6:00 AM  Result Value Ref Range   Triglycerides 49 <150 mg/dL  Glucose, capillary     Status: None   Collection Time: 06/15/15  7:45 AM  Result Value Ref Range   Glucose-Capillary 89 65 - 99 mg/dL  Glucose, capillary      Status: Abnormal   Collection Time: 06/15/15 11:31 AM  Result Value Ref Range   Glucose-Capillary 135 (H) 65 - 99 mg/dL  Glucose, capillary     Status: None   Collection Time: 06/15/15  4:57 PM  Result Value Ref Range   Glucose-Capillary 87 65 - 99 mg/dL  Glucose, capillary     Status: Abnormal   Collection Time: 06/15/15  8:04 PM  Result Value Ref Range   Glucose-Capillary 148 (H) 65 - 99 mg/dL  Glucose, capillary     Status: Abnormal   Collection Time: 06/15/15 11:56 PM  Result Value Ref Range   Glucose-Capillary 166 (H) 65 - 99 mg/dL  Glucose, capillary     Status: Abnormal   Collection Time: 06/16/15  4:18 AM  Result Value Ref Range   Glucose-Capillary 206 (H) 65 - 99 mg/dL  CBC     Status: Abnormal   Collection Time: 06/16/15  5:25 AM  Result Value Ref Range   WBC 9.9 4.0 - 10.5 K/uL    Comment: REPEATED TO VERIFY CONSISTENT WITH PREVIOUS RESULT    RBC 3.30 (L) 3.87 - 5.11 MIL/uL   Hemoglobin 8.1 (L) 12.0 - 15.0 g/dL    Comment: REPEATED TO VERIFY CONSISTENT WITH PREVIOUS RESULT    HCT 25.1 (L) 36.0 - 46.0 %   MCV 76.1 (L) 78.0 - 100.0 fL   MCH 24.5 (L) 26.0 - 34.0 pg   MCHC 32.3 30.0 - 36.0 g/dL   RDW 16.3 (H) 11.5 - 15.5 %   Platelets 157 150 - 400 K/uL    Comment: REPEATED TO VERIFY  Basic metabolic panel     Status: Abnormal   Collection Time: 06/16/15  5:25 AM  Result Value Ref Range   Sodium 134 (L) 135 - 145 mmol/L   Potassium 4.0 3.5 - 5.1 mmol/L   Chloride 108 101 - 111 mmol/L   CO2 21 (L) 22 - 32 mmol/L   Glucose, Bld 123 (H) 65 - 99 mg/dL   BUN 60 (H) 6 - 20 mg/dL   Creatinine, Ser 1.75 (H) 0.44 - 1.00 mg/dL   Calcium 8.4 (L) 8.9 - 10.3 mg/dL   GFR calc non Af Amer 28 (L) >60 mL/min   GFR calc Af Amer 32 (L) >60 mL/min    Comment: (NOTE) The eGFR has been calculated using the CKD EPI equation. This calculation has not been validated in all clinical situations. eGFR's persistently <60 mL/min signify possible Chronic Kidney Disease.    Anion gap  5 5 - 15  Magnesium     Status: None   Collection Time: 06/16/15  5:25 AM  Result Value Ref Range   Magnesium 1.9 1.7 - 2.4 mg/dL  Glucose, capillary     Status: Abnormal   Collection Time: 06/16/15  7:39 AM  Result Value Ref Range   Glucose-Capillary 53 (L) 65 - 99 mg/dL   Comment 1 Notify RN     Imaging / Studies: No results found.  Medications / Allergies:  Scheduled Meds: . allopurinol  150 mg Oral Daily  . enoxaparin (LOVENOX) injection  40 mg Subcutaneous Q24H  . insulin aspart  0-15  Units Subcutaneous 6 times per day  . nystatin  5 mL Oral QID  . octreotide  100 mcg Subcutaneous Q12H  . pantoprazole  40 mg Oral Q supper   Continuous Infusions: . dextrose 5 % and 0.45% NaCl 50 mL/hr at 06/15/15 1744   PRN Meds:.acetaminophen **OR** acetaminophen, alum & mag hydroxide-simeth, methocarbamol, metoprolol, morphine injection, nitroGLYCERIN, ondansetron **OR** ondansetron (ZOFRAN) IV, sodium chloride  Antibiotics: Anti-infectives    None       Assessment/Plan S/p abdominal wound exploration and explantation of infected mesh 04/28/15 - [place] EC fistula - recurrent -allow for clears, eakin's pouch, does not need to be on wall suction at DC -standostatin restarted 9/26 which has helped, minimal output.  -monitor output!! -follow up with Dr. Georgette Dover arranged.  PCM-TPN AKI- baseline.  Dispo-SNF today if TPN is available.   Erby Pian, [facility] Pager [phone]) For consults and floor pages call [phone])  06/16/2015 8:50 AM

## 2015-06-16 NOTE — Discharge Summary (Signed)
Physician Discharge Summary   Patient ID: Marissa [name] MRN: [id number] DOB/AGE: 1941-05-06 74 y.o.  Admit date: 06/04/2015 Discharge date: 06/16/2015  Primary Care Physician:  [name], MD  Discharge Diagnoses:      Chronic enterocutaneous fistula s/p Eakin pouch, difficult seal . Dehydration . acute Encephalopathy   Acute on chronic anemia  . Abdominal pain . Acute renal failure (HCC)   Gout  Hypertension  Insulin-dependent diabetes mellitus    on TPN for diet and wound healing   Consults: Gen. surgery    Recommendations for Outpatient Follow-up:   please follow CBC weekly and transfuse as needed to keep hemoglobin above 8   Please continue Sandostatin/octreotide, until further recommendations from surgery.  Monitor output closely from the eakin's pouch  Please continue clear liquid diet and TPN at night until otherwise directed by surgery. Patient has a follow-up appointment with Dr. Gershon Crane on 06/26/15 when decision regarding advancing diet will be made.   Wound care:  If Eakin pouch begins to leak, change pouch.  Use barrier rings cut in 1/2 to build up distal edge of wound and fill dips in skin to aid in prevention of leakage. Use ostomy powder to sprinkle on denuded skin distal abdomen, brush away excess, tap with damp washcloth and reapply 2-3 layers. Cover with dry gauze areas that are not within the pouch.   TESTS THAT NEED FOLLOW-UP CBC, BMET   DIET: Clear liquid diet with the TPN nightly    Allergies:  [name] Known Allergies   Discharge Medications:   Medication List    STOP taking these medications        amLODipine 10 MG tablet  Commonly known as:  NORVASC     colchicine 0.6 MG tablet     furosemide 20 MG tablet  Commonly known as:  LASIX     metoprolol tartrate 25 MG tablet  Commonly known as:  LOPRESSOR     oxyCODONE 5 MG immediate release tablet  Commonly known as:  Oxy IR/ROXICODONE      TAKE these medications        allopurinol 300 MG tablet  Commonly known as:  ZYLOPRIM  Take 0.5 tablets (150 mg total) by mouth daily.     cyanocobalamin 2000 MCG tablet  Take 1 tablet (2,000 mcg total) by mouth daily.     FERIVA 21/7 75-1 MG Tabs  Take 1 tablet by mouth daily.     insulin aspart 100 UNIT/ML injection  Commonly known as:  novoLOG  Sliding scale  CBG 70 - 120: 0 units: CBG 121 - 150: 2 units; CBG 151 - 200: 3 units; CBG 201 - 250: 5 units; CBG 251 - 300: 8 units;CBG 301 - 350: 11 units; CBG 351 - 400: 15 units; CBG > 400 : 15 units and notify MD     methocarbamol 500 MG tablet  Commonly known as:  ROBAXIN  Take 1 tablet (500 mg total) by mouth every 8 (eight) hours as needed for muscle spasms.     NITROSTAT 0.4 MG SL tablet  Generic drug:  nitroGLYCERIN  DISSOLVE ONE TABLET UNDER THE TONGUE EVERY 5 MINUTES AS NEEDED FOR CHEST PAIN.  DO NOT EXCEED A TOTAL OF 3 DOSES IN 15 MINUTES     nystatin 100000 UNIT/ML suspension  Commonly known as:  MYCOSTATIN  Take 5 mLs (500,000 Units total) by mouth 4 (four) times daily.     octreotide 100 MCG/ML Soln injection  Commonly known as:  SANDOSTATIN  Inject  1 mL (100 mcg total) into the skin every 12 (twelve) hours.     pantoprazole 40 MG tablet  Commonly known as:  PROTONIX  Take 1 tablet (40 mg total) by mouth daily.     UNABLE TO FIND  ADULT TPN per skilled nursing facility protocol QHS/every night (untreated discontinued by surgery).     VOLTAREN 1 % Gel  Generic drug:  diclofenac sodium  Apply 2 g topically 4 (four) times daily as needed (pain).         Brief H and P: For complete details please refer to admission H and P, but in brief Marissa [name] is a 74 y.o. female With hypertension, hyperlipidemia, GERD, coronary artery disease, stage III chronic kidney disease, history of heart failure, although last echocardiogram on 07/02/14 shows a normal EF of 55-60% and [name] wall motion abnormalities. The patient also has a complex surgical history:  She is status post left hemicolectomy with left transverse colon to sigmoid colon anastomosis for diverticulitis, umbilical hernia repair, repair of incarcerated ventral incisional hernia in 2009, open abdominal hysterectomy in 2013, abdominal wound exploration of enterocutaneous fistula and abscess in 04/28/2015 by Dr.Tsuei. Patient has had an ostomy that as high output. Patient was brought to the emergency department due to increased confusion: History was obtained from her daughter. The patient was unable to recognize her daughter on the morning of admission. She was admitted for further workup.   Hospital Course:   Acute encephalopathy: Resolved, patient back to baseline. Alert and oriented, follows all commands, CT head negative for any CVA or any acute intracranial abnormality  Chronic enterocutaneuos fistula: s/p Eakin pouch, difficult seal - Significant improvement in the wound. Patient's Eakin pouch was placed to intermittent wall suction and was very closely followed by wound care and general surgery. The intermittent wall suction has been discontinued. Please continue wound care per instructions above. - Continue clear liquid diet and TPN, sandostatin started on 9/26 per surgery ([name] changes until further management recommended by general surgery on follow-up appointment on 06/26/15 by Dr Gershon Crane)  Acute on chronic anemia, (h/o transfusion reaction per chart, but daughter denies) - Patient received 2 units of packed asked PC transfusion, hemoglobin 8.2 at the time of discharge. Stool occult test on 10/2 was negative - Patient had a colonoscopy done in 4/16 (Dr Collene Mares) had shown pan diverticulosis. EGD 01/13/15, Dr. Benson Norway, showed hemorrhagic gastritis, hiatal hernia. Had recommended PPI and transfuse as needed  Acute renal failure on chronic kidney disease, stage II, metabolic acidosislikely from dehydration, possibly due to volume depletion with ischemic ATN, UA negative for any UTI -  Creatinine stable at 1.7   Essential hypertension  - Stable  H/o NIDDM2: last A1c 5.7 in 03/2015, - Continue sliding scale insulin  H/o gout, pain in the right index finger: Improving - stable, colchicine held due to ARF, - Uric acid 7.5, had started on allopurinol   Adult failure to thrive - will need SNF   Day of Discharge BP 130/64 mmHg  Pulse 77  Temp(Src) 97.5 F (36.4 C) (Oral)  Resp 18  Ht 5\' 4"  (1.626 m)  Wt 102.7 kg (226 lb 6.6 oz)  BMI 38.84 kg/m2  SpO2 100%  Physical Exam: General: Alert and awake oriented x3 not in any acute distress. HEENT: anicteric sclera, pupils reactive to light and accommodation CVS: S1-S2 clear [name] murmur rubs or gallops Chest: clear to auscultation bilaterally, [name] wheezing rales or rhonchi Abdomen: soft eakin's pouch in place  ND,  NBS Extremities: [name] cyanosis, clubbing or edema noted bilaterally Neuro: Cranial nerves II-XII intact, [name] focal neurological deficits   The results of significant diagnostics from this hospitalization (including imaging, microbiology, ancillary and laboratory) are listed below for reference.    LAB RESULTS: Basic Metabolic Panel:  Recent Labs Lab 06/15/15 0600 06/16/15 0525  NA 136 134*  K 3.7 4.0  CL 109 108  CO2 20* 21*  GLUCOSE 113* 123*  BUN 62* 60*  CREATININE 1.72* 1.75*  CALCIUM 8.4* 8.4*  MG 1.5* 1.9  PHOS 3.1  --    Liver Function Tests:  Recent Labs Lab 06/11/15 0558 06/15/15 0600  AST 36 54*  ALT 50 79*  ALKPHOS 106 151*  BILITOT 0.3 0.7  PROT 5.5* 5.8*  ALBUMIN 1.9* 1.8*   [name] results for input(s): LIPASE, AMYLASE in the last 168 hours. [name] results for input(s): AMMONIA in the last 168 hours. CBC:  Recent Labs Lab 06/15/15 0600 06/16/15 0525  WBC 9.6 9.9  NEUTROABS 7.7  --   HGB 8.2* 8.1*  HCT 25.4* 25.1*  MCV 75.4* 76.1*  PLT 196 157   Cardiac Enzymes: [name] results for input(s): CKTOTAL, CKMB, CKMBINDEX, TROPONINI in the last 168 hours. BNP: Invalid input(s):  POCBNP CBG:  Recent Labs Lab 06/16/15 0418 06/16/15 0739  GLUCAP 206* 53*    Significant Diagnostic Studies:  Ct Abdomen Pelvis Wo Contrast  06/04/2015   CLINICAL DATA:  74 year old female with acute right abdominal and flank pain, nausea and vomiting for 1 week.  EXAM: CT ABDOMEN AND PELVIS WITHOUT CONTRAST  TECHNIQUE: Multidetector CT imaging of the abdomen and pelvis was performed following the standard protocol without IV contrast.  COMPARISON:  05/06/2015 and prior CTs  FINDINGS: Please note that parenchymal abnormalities may be missed without intravenous contrast.  Lower chest:  Coronary artery calcifications again identified.  Hepatobiliary: The liver is unremarkable. Patient is status post cholecystectomy. There is [name] evidence biliary dilatation.  Pancreas: Unremarkable  Spleen: Unremarkable  Adrenals/Urinary Tract: Mild bilateral renal atrophy again identified. The kidneys, adrenal glands and bladder are otherwise unremarkable. There is [name] evidence of hydronephrosis or urinary calculi.  Stomach/Bowel: A small hiatal hernia is again noted. There is [name] evidence of bowel obstruction or focal bowel wall thickening. Descending and sigmoid colonic diverticulosis noted without evidence of diverticulitis.  Vascular/Lymphatic: Aortic atherosclerotic calcifications noted without aneurysm. [name] enlarged lymph nodes are identified.  Reproductive: The patient is status post hysterectomy. The adnexal regions are unremarkable.  Other: [name] free fluid or pneumoperitoneum. Periumbilical surgical changes are identified. There is [name] evidence of ventral hernia.  Musculoskeletal: [name] acute abnormalities identified. Multilevel mild degenerative disc disease and moderate facet arthropathy in the lumbar spine noted.  IMPRESSION: [name] evidence of acute abnormality. [name] evidence of urinary calculi or hydronephrosis.  Small hiatal hernia and mild bilateral renal atrophy again noted.  Multilevel degenerative changes within the  lumbar spine.  Coronary artery disease and aortic atherosclerosis.   Electronically Signed   By: Margarette Canada M.D.   On: 06/04/2015 12:09   Dg Chest Port 1 View  06/04/2015   CLINICAL DATA:  Sepsis.  Low-grade fever.  EXAM: PORTABLE CHEST 1 VIEW  COMPARISON:  04/27/2015  FINDINGS: Right PICC line tip:  SVC.  Atherosclerotic aortic arch. Thoracic spondylosis. Upper normal heart size.  The lungs appear clear.  IMPRESSION: 1. Atherosclerosis. 2. Right PICC line tip:  SVC.   Electronically Signed   By: Van Clines M.D.   On: 06/04/2015 10:55  2D ECHO:   Disposition and Follow-up:     Discharge Instructions    Discharge instructions    Complete by:  As directed   Diet: clear liquid diet TPN at night, cyclical. Continue until discontinued by surgery.  Wound care:  If Eakin pouch begins to leak, change pouch.  Use barrier rings cut in 1/2 to build up distal edge of wound and fill dips in skin to aid in prevention of leakage. Use ostomy powder to sprinkle on denuded skin distal abdomen, brush away excess, tap with damp washcloth and reapply 2-3 layers. Cover with dry gauze areas that are not within the pouch.     Increase activity slowly    Complete by:  As directed             DISPOSITION: Skilled nursing facility   DISCHARGE FOLLOW-UP Follow-up Information    Follow up with Maia Petties., MD On 06/26/2015.   Specialty:  General Surgery   Why:  arrive by 10:20AM for a 10:40AM checkup with your surgeon   Contact information:   [address] [place] [address] [phone]       Follow up with [name], MD. Schedule an appointment as soon as possible for a visit in 2 weeks.   Specialty:  Internal Medicine   Why:  for hospital follow-up   Contact information:   [address] [place] [address] [phone]        Time spent on Discharge: 13mins   Signed:   Arlin Sass M.D. [facility] 06/16/2015, 11:41 AM Pager:  [phone]

## 2015-06-16 NOTE — Consult Note (Addendum)
WOC follow-up: Eakin pouch intact with good seal, was applied yesterday.  Minimal amt thick brown drainage in cannister to low wall suction.  Pt will not need suction upon discharge and pouch can be converted to gravity.  Supplies and instructions at bedside for staff nurse use. Julien Girt MSN, RN, [place], [place], [place]

## 2015-06-17 ENCOUNTER — Telehealth: Payer: Self-pay | Admitting: *Deleted

## 2015-06-17 NOTE — Telephone Encounter (Signed)
Pt was on tcm list D/C 06/16/15, butt sent to SNF...Marissa [name]

## 2015-07-13 ENCOUNTER — Emergency Department (HOSPITAL_COMMUNITY): Payer: Medicare Other

## 2015-07-13 ENCOUNTER — Inpatient Hospital Stay (HOSPITAL_COMMUNITY)
Admission: EM | Admit: 2015-07-13 | Discharge: 2015-08-04 | DRG: 314 | Disposition: A | Discharge status: Skilled Nursing Facility | Payer: Medicare Other | Attending: Internal Medicine | Admitting: Internal Medicine

## 2015-07-13 ENCOUNTER — Encounter (HOSPITAL_COMMUNITY): Payer: Self-pay | Admitting: *Deleted

## 2015-07-13 DIAGNOSIS — N183 Chronic kidney disease, stage 3 unspecified: Secondary | ICD-10-CM | POA: Diagnosis present

## 2015-07-13 DIAGNOSIS — A411 Sepsis due to other specified staphylococcus: Secondary | ICD-10-CM | POA: Diagnosis present

## 2015-07-13 DIAGNOSIS — I130 Hypertensive heart and chronic kidney disease with heart failure and stage 1 through stage 4 chronic kidney disease, or unspecified chronic kidney disease: Secondary | ICD-10-CM | POA: Diagnosis present

## 2015-07-13 DIAGNOSIS — E785 Hyperlipidemia, unspecified: Secondary | ICD-10-CM | POA: Diagnosis present

## 2015-07-13 DIAGNOSIS — Z794 Long term (current) use of insulin: Secondary | ICD-10-CM

## 2015-07-13 DIAGNOSIS — Z6839 Body mass index (BMI) 39.0-39.9, adult: Secondary | ICD-10-CM

## 2015-07-13 DIAGNOSIS — R008 Other abnormalities of heart beat: Secondary | ICD-10-CM | POA: Diagnosis not present

## 2015-07-13 DIAGNOSIS — B964 Proteus (mirabilis) (morganii) as the cause of diseases classified elsewhere: Secondary | ICD-10-CM | POA: Diagnosis present

## 2015-07-13 DIAGNOSIS — G9341 Metabolic encephalopathy: Secondary | ICD-10-CM | POA: Diagnosis present

## 2015-07-13 DIAGNOSIS — D631 Anemia in chronic kidney disease: Secondary | ICD-10-CM | POA: Diagnosis present

## 2015-07-13 DIAGNOSIS — D509 Iron deficiency anemia, unspecified: Secondary | ICD-10-CM | POA: Diagnosis present

## 2015-07-13 DIAGNOSIS — E876 Hypokalemia: Secondary | ICD-10-CM | POA: Diagnosis not present

## 2015-07-13 DIAGNOSIS — E861 Hypovolemia: Secondary | ICD-10-CM | POA: Diagnosis present

## 2015-07-13 DIAGNOSIS — N19 Unspecified kidney failure: Secondary | ICD-10-CM

## 2015-07-13 DIAGNOSIS — Z515 Encounter for palliative care: Secondary | ICD-10-CM | POA: Diagnosis not present

## 2015-07-13 DIAGNOSIS — E43 Unspecified severe protein-calorie malnutrition: Secondary | ICD-10-CM | POA: Diagnosis present

## 2015-07-13 DIAGNOSIS — E1122 Type 2 diabetes mellitus with diabetic chronic kidney disease: Secondary | ICD-10-CM | POA: Diagnosis present

## 2015-07-13 DIAGNOSIS — R7881 Bacteremia: Secondary | ICD-10-CM | POA: Diagnosis not present

## 2015-07-13 DIAGNOSIS — N189 Chronic kidney disease, unspecified: Secondary | ICD-10-CM | POA: Diagnosis not present

## 2015-07-13 DIAGNOSIS — I5032 Chronic diastolic (congestive) heart failure: Secondary | ICD-10-CM | POA: Diagnosis present

## 2015-07-13 DIAGNOSIS — F329 Major depressive disorder, single episode, unspecified: Secondary | ICD-10-CM | POA: Diagnosis not present

## 2015-07-13 DIAGNOSIS — M109 Gout, unspecified: Secondary | ICD-10-CM | POA: Diagnosis present

## 2015-07-13 DIAGNOSIS — I2510 Atherosclerotic heart disease of native coronary artery without angina pectoris: Secondary | ICD-10-CM | POA: Diagnosis present

## 2015-07-13 DIAGNOSIS — E119 Type 2 diabetes mellitus without complications: Secondary | ICD-10-CM

## 2015-07-13 DIAGNOSIS — N390 Urinary tract infection, site not specified: Secondary | ICD-10-CM | POA: Diagnosis present

## 2015-07-13 DIAGNOSIS — E875 Hyperkalemia: Secondary | ICD-10-CM | POA: Diagnosis not present

## 2015-07-13 DIAGNOSIS — R627 Adult failure to thrive: Secondary | ICD-10-CM | POA: Diagnosis present

## 2015-07-13 DIAGNOSIS — T368X5A Adverse effect of other systemic antibiotics, initial encounter: Secondary | ICD-10-CM | POA: Diagnosis not present

## 2015-07-13 DIAGNOSIS — E46 Unspecified protein-calorie malnutrition: Secondary | ICD-10-CM | POA: Diagnosis not present

## 2015-07-13 DIAGNOSIS — E860 Dehydration: Secondary | ICD-10-CM | POA: Diagnosis present

## 2015-07-13 DIAGNOSIS — E872 Acidosis, unspecified: Secondary | ICD-10-CM | POA: Diagnosis present

## 2015-07-13 DIAGNOSIS — E871 Hypo-osmolality and hyponatremia: Secondary | ICD-10-CM | POA: Diagnosis present

## 2015-07-13 DIAGNOSIS — T39395A Adverse effect of other nonsteroidal anti-inflammatory drugs [NSAID], initial encounter: Secondary | ICD-10-CM | POA: Diagnosis present

## 2015-07-13 DIAGNOSIS — I10 Essential (primary) hypertension: Secondary | ICD-10-CM

## 2015-07-13 DIAGNOSIS — Z79899 Other long term (current) drug therapy: Secondary | ICD-10-CM | POA: Diagnosis not present

## 2015-07-13 DIAGNOSIS — I5041 Acute combined systolic (congestive) and diastolic (congestive) heart failure: Secondary | ICD-10-CM | POA: Diagnosis not present

## 2015-07-13 DIAGNOSIS — Y828 Other medical devices associated with adverse incidents: Secondary | ICD-10-CM | POA: Diagnosis present

## 2015-07-13 DIAGNOSIS — G4733 Obstructive sleep apnea (adult) (pediatric): Secondary | ICD-10-CM | POA: Diagnosis present

## 2015-07-13 DIAGNOSIS — Z66 Do not resuscitate: Secondary | ICD-10-CM | POA: Diagnosis present

## 2015-07-13 DIAGNOSIS — Z95828 Presence of other vascular implants and grafts: Secondary | ICD-10-CM | POA: Diagnosis not present

## 2015-07-13 DIAGNOSIS — N184 Chronic kidney disease, stage 4 (severe): Secondary | ICD-10-CM | POA: Diagnosis present

## 2015-07-13 DIAGNOSIS — E1142 Type 2 diabetes mellitus with diabetic polyneuropathy: Secondary | ICD-10-CM | POA: Diagnosis present

## 2015-07-13 DIAGNOSIS — K632 Fistula of intestine: Secondary | ICD-10-CM | POA: Diagnosis present

## 2015-07-13 DIAGNOSIS — Z9049 Acquired absence of other specified parts of digestive tract: Secondary | ICD-10-CM

## 2015-07-13 DIAGNOSIS — Z955 Presence of coronary angioplasty implant and graft: Secondary | ICD-10-CM | POA: Diagnosis not present

## 2015-07-13 DIAGNOSIS — K219 Gastro-esophageal reflux disease without esophagitis: Secondary | ICD-10-CM | POA: Diagnosis present

## 2015-07-13 DIAGNOSIS — N179 Acute kidney failure, unspecified: Secondary | ICD-10-CM | POA: Diagnosis present

## 2015-07-13 DIAGNOSIS — R509 Fever, unspecified: Secondary | ICD-10-CM | POA: Diagnosis not present

## 2015-07-13 DIAGNOSIS — T80211A Bloodstream infection due to central venous catheter, initial encounter: Secondary | ICD-10-CM | POA: Diagnosis present

## 2015-07-13 DIAGNOSIS — E669 Obesity, unspecified: Secondary | ICD-10-CM | POA: Diagnosis present

## 2015-07-13 DIAGNOSIS — R609 Edema, unspecified: Secondary | ICD-10-CM

## 2015-07-13 DIAGNOSIS — B370 Candidal stomatitis: Secondary | ICD-10-CM | POA: Diagnosis present

## 2015-07-13 DIAGNOSIS — K7200 Acute and subacute hepatic failure without coma: Secondary | ICD-10-CM | POA: Diagnosis present

## 2015-07-13 DIAGNOSIS — Z452 Encounter for adjustment and management of vascular access device: Secondary | ICD-10-CM

## 2015-07-13 DIAGNOSIS — R1084 Generalized abdominal pain: Secondary | ICD-10-CM

## 2015-07-13 DIAGNOSIS — Z791 Long term (current) use of non-steroidal anti-inflammatories (NSAID): Secondary | ICD-10-CM | POA: Diagnosis not present

## 2015-07-13 DIAGNOSIS — Z86718 Personal history of other venous thrombosis and embolism: Secondary | ICD-10-CM

## 2015-07-13 DIAGNOSIS — R0789 Other chest pain: Secondary | ICD-10-CM | POA: Diagnosis not present

## 2015-07-13 DIAGNOSIS — I5033 Acute on chronic diastolic (congestive) heart failure: Secondary | ICD-10-CM | POA: Diagnosis not present

## 2015-07-13 DIAGNOSIS — R740 Nonspecific elevation of levels of transaminase and lactic acid dehydrogenase [LDH]: Secondary | ICD-10-CM

## 2015-07-13 DIAGNOSIS — B957 Other staphylococcus as the cause of diseases classified elsewhere: Secondary | ICD-10-CM | POA: Diagnosis not present

## 2015-07-13 DIAGNOSIS — R1111 Vomiting without nausea: Secondary | ICD-10-CM | POA: Diagnosis present

## 2015-07-13 DIAGNOSIS — R7401 Elevation of levels of liver transaminase levels: Secondary | ICD-10-CM

## 2015-07-13 LAB — CBC WITH DIFFERENTIAL/PLATELET
BASOS PCT: 0 %
Basophils Absolute: 0 10*3/uL (ref 0.0–0.1)
EOS ABS: 0.2 10*3/uL (ref 0.0–0.7)
EOS PCT: 2 %
HCT: 32.1 % — ABNORMAL LOW (ref 36.0–46.0)
Hemoglobin: 10.7 g/dL — ABNORMAL LOW (ref 12.0–15.0)
LYMPHS ABS: 0.9 10*3/uL (ref 0.7–4.0)
Lymphocytes Relative: 11 %
MCH: 24.4 pg — AB (ref 26.0–34.0)
MCHC: 33.3 g/dL (ref 30.0–36.0)
MCV: 73.1 fL — AB (ref 78.0–100.0)
MONO ABS: 0.7 10*3/uL (ref 0.1–1.0)
Monocytes Relative: 9 %
Neutro Abs: 6.3 10*3/uL (ref 1.7–7.7)
Neutrophils Relative %: 78 %
PLATELETS: 187 10*3/uL (ref 150–400)
RBC: 4.39 MIL/uL (ref 3.87–5.11)
RDW: 14.7 % (ref 11.5–15.5)
WBC: 8.1 10*3/uL (ref 4.0–10.5)

## 2015-07-13 LAB — COMPREHENSIVE METABOLIC PANEL
ALT: 52 U/L (ref 14–54)
ANION GAP: 11 (ref 5–15)
AST: 42 U/L — ABNORMAL HIGH (ref 15–41)
Albumin: 3 g/dL — ABNORMAL LOW (ref 3.5–5.0)
Alkaline Phosphatase: 176 U/L — ABNORMAL HIGH (ref 38–126)
BUN: 160 mg/dL — ABNORMAL HIGH (ref 6–20)
CHLORIDE: 106 mmol/L (ref 101–111)
CO2: 12 mmol/L — ABNORMAL LOW (ref 22–32)
Calcium: 9.7 mg/dL (ref 8.9–10.3)
Creatinine, Ser: 3.44 mg/dL — ABNORMAL HIGH (ref 0.44–1.00)
GFR, EST AFRICAN AMERICAN: 14 mL/min — AB (ref >60)
GFR, EST NON AFRICAN AMERICAN: 12 mL/min — AB (ref >60)
Glucose, Bld: 155 mg/dL — ABNORMAL HIGH (ref 65–99)
POTASSIUM: 4.9 mmol/L (ref 3.5–5.1)
Sodium: 129 mmol/L — ABNORMAL LOW (ref 135–145)
Total Bilirubin: 0.9 mg/dL (ref 0.3–1.2)
Total Protein: 8.8 g/dL — ABNORMAL HIGH (ref 6.5–8.1)

## 2015-07-13 LAB — GLUCOSE, CAPILLARY
GLUCOSE-CAPILLARY: 83 mg/dL (ref 65–99)
GLUCOSE-CAPILLARY: 80 mg/dL (ref 65–99)

## 2015-07-13 LAB — I-STAT CG4 LACTIC ACID, ED
LACTIC ACID, VENOUS: 0.51 mmol/L (ref 0.5–2.0)
Lactic Acid, Venous: 0.69 mmol/L (ref 0.5–2.0)

## 2015-07-13 LAB — URINALYSIS, ROUTINE W REFLEX MICROSCOPIC
Bilirubin Urine: NEGATIVE
Glucose, UA: NEGATIVE mg/dL
Ketones, ur: NEGATIVE mg/dL
NITRITE: NEGATIVE
PROTEIN: 30 mg/dL — AB
Specific Gravity, Urine: 1.012 (ref 1.005–1.030)
UROBILINOGEN UA: 0.2 mg/dL (ref 0.0–1.0)
pH: 6.5 (ref 5.0–8.0)

## 2015-07-13 LAB — URINE MICROSCOPIC-ADD ON

## 2015-07-13 LAB — TROPONIN I: Troponin I: <0.03 ng/mL (ref <0.031)

## 2015-07-13 MED ORDER — PANTOPRAZOLE SODIUM 40 MG IV SOLR
40.0000 mg | INTRAVENOUS | Status: DC
  Administered 2015-07-13 – 2015-08-03 (×21): 40 mg via INTRAVENOUS
  Filled 2015-07-13 (×21): qty 40

## 2015-07-13 MED ORDER — DEXTROSE 5 % IV SOLN
1.0000 g | INTRAVENOUS | Status: DC
  Administered 2015-07-14 – 2015-07-16 (×3): 1 g via INTRAVENOUS
  Filled 2015-07-13 (×4): qty 10

## 2015-07-13 MED ORDER — OCTREOTIDE ACETATE 100 MCG/ML IJ SOLN
100.0000 ug | Freq: Two times a day (BID) | INTRAMUSCULAR | Status: DC
  Administered 2015-07-14 – 2015-08-04 (×40): 100 ug via SUBCUTANEOUS
  Filled 2015-07-13 (×53): qty 1

## 2015-07-13 MED ORDER — ONDANSETRON HCL 4 MG PO TABS: 4.0000 mg | ORAL_TABLET | Freq: Four times a day (QID) | ORAL | Status: DC | PRN

## 2015-07-13 MED ORDER — HEPARIN SODIUM (PORCINE) 5000 UNIT/ML IJ SOLN
5000.0000 [IU] | Freq: Three times a day (TID) | INTRAMUSCULAR | Status: DC
  Administered 2015-07-13 – 2015-07-27 (×39): 5000 [IU] via SUBCUTANEOUS
  Filled 2015-07-13 (×34): qty 1

## 2015-07-13 MED ORDER — ALTEPLASE 2 MG IJ SOLR
2.0000 mg | Freq: Once | INTRAMUSCULAR | Status: DC
  Filled 2015-07-13: qty 2

## 2015-07-13 MED ORDER — DIPHENHYDRAMINE HCL 12.5 MG/5ML PO ELIX
25.0000 mg | ORAL_SOLUTION | Freq: Three times a day (TID) | ORAL | Status: DC | PRN
  Administered 2015-07-24 – 2015-07-28 (×4): 25 mg via ORAL
  Filled 2015-07-13 (×7): qty 10

## 2015-07-13 MED ORDER — DEXTROSE 5 % IV SOLN
1.0000 g | Freq: Once | INTRAVENOUS | Status: AC
  Administered 2015-07-13: 1 g via INTRAVENOUS
  Filled 2015-07-13: qty 10

## 2015-07-13 MED ORDER — DEXTROSE 10 % IV SOLN
INTRAVENOUS | Status: AC
  Administered 2015-07-13 – 2015-07-14 (×2): via INTRAVENOUS

## 2015-07-13 MED ORDER — NITROGLYCERIN 0.4 MG SL SUBL: 0.4000 mg | SUBLINGUAL_TABLET | SUBLINGUAL | Status: DC | PRN

## 2015-07-13 MED ORDER — OXYCODONE HCL 5 MG PO TABS
5.0000 mg | ORAL_TABLET | Freq: Four times a day (QID) | ORAL | Status: DC | PRN
  Administered 2015-07-29 (×2): 5 mg via ORAL
  Filled 2015-07-13 (×3): qty 1

## 2015-07-13 MED ORDER — NYSTATIN 100000 UNIT/ML MT SUSP
5.0000 mL | Freq: Four times a day (QID) | OROMUCOSAL | Status: DC
  Administered 2015-07-13 – 2015-08-03 (×75): 500000 [IU] via ORAL
  Filled 2015-07-13 (×73): qty 5

## 2015-07-13 MED ORDER — INSULIN ASPART 100 UNIT/ML ~~LOC~~ SOLN
0.0000 [IU] | SUBCUTANEOUS | Status: DC
  Administered 2015-07-14: 2 [IU] via SUBCUTANEOUS
  Administered 2015-07-14: 1 [IU] via SUBCUTANEOUS
  Administered 2015-07-14: 2 [IU] via SUBCUTANEOUS
  Administered 2015-07-14: 1 [IU] via SUBCUTANEOUS
  Administered 2015-07-14: 2 [IU] via SUBCUTANEOUS
  Administered 2015-07-14: 1 [IU] via SUBCUTANEOUS
  Administered 2015-07-15: 2 [IU] via SUBCUTANEOUS
  Administered 2015-07-15: 1 [IU] via SUBCUTANEOUS
  Administered 2015-07-15: 2 [IU] via SUBCUTANEOUS
  Administered 2015-07-15 (×2): 1 [IU] via SUBCUTANEOUS
  Administered 2015-07-15 – 2015-07-16 (×4): 2 [IU] via SUBCUTANEOUS

## 2015-07-13 MED ORDER — GLYCERIN (LAXATIVE) 2.1 G RE SUPP
1.0000 | RECTAL | Status: DC | PRN
  Filled 2015-07-13: qty 1

## 2015-07-13 MED ORDER — ALUM & MAG HYDROXIDE-SIMETH 200-200-20 MG/5ML PO SUSP: 30.0000 mL | Freq: Four times a day (QID) | ORAL | Status: DC | PRN

## 2015-07-13 MED ORDER — ONDANSETRON HCL 4 MG/2ML IJ SOLN
4.0000 mg | Freq: Four times a day (QID) | INTRAMUSCULAR | Status: DC | PRN
  Administered 2015-07-20 – 2015-08-03 (×7): 4 mg via INTRAVENOUS
  Filled 2015-07-13 (×7): qty 2

## 2015-07-13 MED ORDER — DEXTROSE 10 % IV SOLN: INTRAVENOUS | Status: DC

## 2015-07-13 MED ORDER — MORPHINE SULFATE (PF) 2 MG/ML IV SOLN
2.0000 mg | INTRAVENOUS | Status: DC | PRN
  Administered 2015-07-16 – 2015-07-23 (×10): 2 mg via INTRAVENOUS
  Administered 2015-07-26: 4 mg via INTRAVENOUS
  Administered 2015-07-28: 2 mg via INTRAVENOUS
  Administered 2015-07-28: 4 mg via INTRAVENOUS
  Administered 2015-07-28 – 2015-07-30 (×5): 2 mg via INTRAVENOUS
  Filled 2015-07-13 (×5): qty 1
  Filled 2015-07-13: qty 2
  Filled 2015-07-13 (×7): qty 1
  Filled 2015-07-13: qty 2
  Filled 2015-07-13 (×3): qty 1

## 2015-07-13 MED ORDER — DICLOFENAC SODIUM 1 % TD GEL
2.0000 g | Freq: Four times a day (QID) | TRANSDERMAL | Status: DC | PRN
  Filled 2015-07-13: qty 100

## 2015-07-13 MED ORDER — SODIUM CHLORIDE 0.9 % IJ SOLN
10.0000 mL | INTRAMUSCULAR | Status: DC | PRN
  Administered 2015-07-14: 10 mL
  Filled 2015-07-13: qty 40

## 2015-07-13 NOTE — Consult Note (Signed)
Marissa [name] 1941/08/18  [id number].   Requesting MD: Dr. Linna Darner Chief Complaint/Reason for Consult: EC fistula HPI: This is a lady well known to our service for an [facility] fistula that is primarily managed by Dr. Georgette Dover.  She has been at the SNF, [facility] for several weeks on sandostatin with an Eakin's pouch and initially clear liquids.  She saw Dr. Georgette Dover recently and was told to try a solid diet since her output was minimal and her fistula was healing well.  Apparently, after trying this a couple of times her output significantly increased.  No one called our office to inform us of this.  The facility also removed her Eakin's pouch last week despite the increase in her EC output.  She now complains of horrible abdominal burning due to bilious excretion onto her skin.  She has no appetite and is not eating.  Her TNA has been stopped for some reason as well.  She was brought into the ED today due to vomiting and dehydration with ARF.  She had a CT scan that was negative for infection, just persistent EC fistula.  We have been asked to see the patient for further recommendations.  ROS: Please see HPI.  Family History  Problem Relation Age of Onset  . Diabetes Mother   . Coronary artery disease Mother   . Diabetes Sister   . Coronary artery disease Sister     Past Medical History  Diagnosis Date  . Hypertension   . Hyperlipidemia   . History of DVT (deep vein thrombosis)   . History of colonic polyps   . Gout   . Polyarthritis   . Microcytic anemia   . GERD (gastroesophageal reflux disease)   . Esophagitis   . Hidradenitis suppurativa     s/p axillary sweat gland removal  . H/O blood transfusion reaction     at [facility] hospital  . Diverticulitis   . Difficulty sleeping   . PMB (postmenopausal bleeding)     X 2 YRS  . Bowel trouble     OCCASIONAL BOWEL INCONTINENCE  . Fibroids     s/p TAH/BSO 07/2012  . CAD (coronary artery disease)     a. 8/07 had BMS to OM.  b.  In-stent restenosis with later Promus DES to same site.  c. Lexiscan myoview in 1/13 showed EF 67%, no ischemia or infarction.  d. Echo (2/13) showed EF 55-60%, moderate LVH, mild MR.  e. Lex MV 6/14: no isch, EF 53%;  f. Echo 6/14: mild LVH, EF 55-60%, Gr 1 DD, MAC, mild MR, mild LAE  . Peripheral neuropathy ([place])   . Chronic diastolic CHF (congestive heart failure) ([place]) 06/29/2014  . Renal insufficiency     baseline Cr ~ 1.3  . Chronic kidney disease (CKD), stage III (moderate)     Archie Endo 04/27/2015  . OSA on CPAP     "wear it sometimes" (04/27/2015)  . History of hiatal hernia   . MBTDHRCB(638.4)     "weekly" (04/27/2015)  . Enterocutaneous fistula     "have had it 2-3 months now" 04/27/2015  . Type II diabetes mellitus (HCC)     diet controlled: pt's daughter denies mother's hx of diabetes    Past Surgical History  Procedure Laterality Date  . Anterior cervical decomp/discectomy fusion    . Cervical laminectomy    . Umbilical hernia repair    . Axillary hidradenitis excision Bilateral   . Knee arthroscopy Right   . Colectomy Left 2004  left partial for perforation  . Cholecystectomy    . Tubal ligation    . Coronary angioplasty with stent placement      of mid and distal circumflex coronary artery. 1st stent was 04/2006, with drug eluting stent placed on 12/20/07 (30-50% RCA stenosis). Cardiologist Dr. Einar Gip.  . Dilation and curettage of uterus  02/2011  . Hysteroscopy w/d&c  05/04/2012    Procedure: DILATATION AND CURETTAGE /HYSTEROSCOPY;  Surgeon: Lahoma Crocker, MD;  Location: [facility];  Service: Gynecology;  Laterality: N/A;  . Robotic assisted total hysterectomy with bilateral salpingo oopherectomy  07/24/2012    Procedure: ROBOTIC ASSISTED TOTAL HYSTERECTOMY WITH BILATERAL SALPINGO OOPHORECTOMY;  Surgeon: Janie Morning, MD PHD;  Location: [facility];  Service: Gynecology;  Laterality: Bilateral;  attempted ,  converted to abdomnial hysterectomy  . Abdominal hysterectomy   07/24/2012    Procedure: HYSTERECTOMY ABDOMINAL;  Surgeon: Janie Morning, MD PHD;  Location: [facility];  Service: Gynecology;;  . Salpingoophorectomy  07/24/2012    Procedure: SALPINGO OOPHORECTOMY;  Surgeon: Janie Morning, MD PHD;  Location: [facility];  Service: Gynecology;  Laterality: Bilateral;  . Abdominal wound dehiscence  08/01/2012    Procedure: ABDOMINAL WOUND DEHISCENCE;  Surgeon: Lahoma Crocker, MD;  Location: [facility];  Service: Gynecology;  Laterality: N/A;  . Laparotomy  08/02/2012    Procedure: EXPLORATORY LAPAROTOMY;  Surgeon: Imogene Burn. Georgette Dover, MD;  Location: [facility];  Service: General;  Laterality: N/A;  placement of abdominal wound vac  . Colonoscopy N/A 12/22/2014    Procedure: COLONOSCOPY;  Surgeon: Juanita Craver, MD;  Location: [facility];  Service: Endoscopy;  Laterality: N/A;  . Esophagogastroduodenoscopy Left 01/13/2015    Procedure: ESOPHAGOGASTRODUODENOSCOPY (EGD);  Surgeon: Carol Ada, MD;  Location: [facility];  Service: Endoscopy;  Laterality: Left;  . Back surgery    . Hernia repair    . Laparotomy N/A 04/28/2015    Procedure: Abdominal Wound Explororation, Explantation of Abdominal Mesh;  Surgeon: Donnie Mesa, MD;  Location: [place];  Service: General;  Laterality: N/A;  . Cardiac surgery      Social History:  reports that she has never smoked. She has never used smokeless tobacco. She reports that she does not drink alcohol or use illicit drugs.  Allergies: No Known Allergies   (Not in a hospital admission)  Blood pressure 128/64, pulse 94, temperature 97.2 F (36.2 C), temperature source Axillary, resp. rate 22, SpO2 100 %. Physical Exam: Gen: NAD Heart: regular rate and rhythm Lungs: CTAB Abd: copious amounts of bilious drainage coming from a 1.5cm EC fistula.  She has erythema from bile leakage on her abdominal wall.  Tender appropriately secondary to this finding.  +BS, ND, obese   Results for orders placed or performed during the hospital encounter  of 07/13/15 (from the past 48 hour(s))  Urinalysis, Routine w reflex microscopic (not at [facility])     Status: Abnormal   Collection Time: 07/13/15 12:14 PM  Result Value Ref Range   Color, Urine YELLOW YELLOW   APPearance CLEAR CLEAR   Specific Gravity, Urine 1.012 1.005 - 1.030   pH 6.5 5.0 - 8.0   Glucose, UA NEGATIVE NEGATIVE mg/dL   Hgb urine dipstick LARGE (A) NEGATIVE   Bilirubin Urine NEGATIVE NEGATIVE   Ketones, ur NEGATIVE NEGATIVE mg/dL   Protein, ur 30 (A) NEGATIVE mg/dL   Urobilinogen, UA 0.2 0.0 - 1.0 mg/dL   Nitrite NEGATIVE NEGATIVE   Leukocytes, UA LARGE (A) NEGATIVE  Urine microscopic-add on     Status: Abnormal  Collection Time: 07/13/15 12:14 PM  Result Value Ref Range   Squamous Epithelial / LPF FEW (A) RARE   WBC, UA 11-20 <3 WBC/hpf   RBC / HPF 0-2 <3 RBC/hpf   Bacteria, UA FEW (A) RARE   Casts GRANULAR CAST (A) NEGATIVE  CBC with Differential/Platelet     Status: Abnormal   Collection Time: 07/13/15  1:35 PM  Result Value Ref Range   WBC 8.1 4.0 - 10.5 K/uL   RBC 4.39 3.87 - 5.11 MIL/uL   Hemoglobin 10.7 (L) 12.0 - 15.0 g/dL   HCT 32.1 (L) 36.0 - 46.0 %   MCV 73.1 (L) 78.0 - 100.0 fL   MCH 24.4 (L) 26.0 - 34.0 pg   MCHC 33.3 30.0 - 36.0 g/dL   RDW 14.7 11.5 - 15.5 %   Platelets 187 150 - 400 K/uL   Neutrophils Relative % 78 %   Lymphocytes Relative 11 %   Monocytes Relative 9 %   Eosinophils Relative 2 %   Basophils Relative 0 %   Neutro Abs 6.3 1.7 - 7.7 K/uL   Lymphs Abs 0.9 0.7 - 4.0 K/uL   Monocytes Absolute 0.7 0.1 - 1.0 K/uL   Eosinophils Absolute 0.2 0.0 - 0.7 K/uL   Basophils Absolute 0.0 0.0 - 0.1 K/uL   Smear Review LARGE PLATELETS PRESENT   Comprehensive metabolic panel     Status: Abnormal   Collection Time: 07/13/15  1:35 PM  Result Value Ref Range   Sodium 129 (L) 135 - 145 mmol/L   Potassium 4.9 3.5 - 5.1 mmol/L   Chloride 106 101 - 111 mmol/L   CO2 12 (L) 22 - 32 mmol/L   Glucose, Bld 155 (H) 65 - 99 mg/dL   BUN 160 (H) 6 -  20 mg/dL   Creatinine, Ser 3.44 (H) 0.44 - 1.00 mg/dL   Calcium 9.7 8.9 - 10.3 mg/dL   Total Protein 8.8 (H) 6.5 - 8.1 g/dL   Albumin 3.0 (L) 3.5 - 5.0 g/dL   AST 42 (H) 15 - 41 U/L   ALT 52 14 - 54 U/L   Alkaline Phosphatase 176 (H) 38 - 126 U/L   Total Bilirubin 0.9 0.3 - 1.2 mg/dL   GFR calc non Af Amer 12 (L) >60 mL/min   GFR calc Af Amer 14 (L) >60 mL/min    Comment: (NOTE) The eGFR has been calculated using the CKD EPI equation. This calculation has not been validated in all clinical situations. eGFR's persistently <60 mL/min signify possible Chronic Kidney Disease.    Anion gap 11 5 - 15  Troponin I     Status: None   Collection Time: 07/13/15  1:35 PM  Result Value Ref Range   Troponin I <0.03 <0.031 ng/mL    Comment:        NO INDICATION OF MYOCARDIAL INJURY.   I-Stat CG4 Lactic Acid, ED     Status: None   Collection Time: 07/13/15  1:38 PM  Result Value Ref Range   Lactic Acid, Venous 0.69 0.5 - 2.0 mmol/L  Blood culture (routine x 2)     Status: None (Preliminary result)   Collection Time: 07/13/15  2:50 PM  Result Value Ref Range   Specimen Description BLOOD PICC LINE    Special Requests BOTTLES DRAWN AEROBIC AND ANAEROBIC 5ML    Culture PENDING    Report Status PENDING    Ct Abdomen Pelvis Wo Contrast  07/13/2015  CLINICAL DATA:  Subsequent encounter for nausea  vomiting with low abdominal pain and increased drainage from fistula. EXAM: CT ABDOMEN AND PELVIS WITHOUT CONTRAST TECHNIQUE: Multidetector CT imaging of the abdomen and pelvis was performed following the standard protocol without IV contrast. COMPARISON:  06/04/2015. FINDINGS: Lower chest: Motion artifact with some atelectasis in the dependent bases. Hepatobiliary: No focal abnormality in the liver on this study without intravenous contrast. No evidence of hepatomegaly. Gallbladder is surgically absent. No intrahepatic or extrahepatic biliary dilation. Pancreas: No focal mass lesion. No dilatation of the  main duct. No intraparenchymal cyst. No peripancreatic edema. Spleen: No splenomegaly. No focal mass lesion. Adrenals/Urinary Tract: No adrenal nodule or mass. No mass lesion evident in either kidney on this study without intravenous contrast material. No evidence for hydroureteronephrosis. The urinary bladder appears normal for the degree of distention. Stomach/Bowel: Small hiatal hernia noted. Metallic foreign body identified along the posterior wall of the gastric fundus. This is been present on multiple prior studies is well. Duodenum is normally positioned as is the ligament of Treitz. No small bowel wall thickening. No small bowel dilatation. The terminal ileum is normal. The appendix is not visualized, but there is no edema or inflammation in the region of the cecum. Diverticuli are seen scattered along the entire length of the colon without CT findings of diverticulitis. Small bowel adhesions to the anterior abdominal wall again noted. There is soft tissue attenuation in the midline anterior abdominal wall, at the site of previous surgical wound. This is the area where enterocutaneous fistula was visualized on the previous study. No gas in this region today but no oral contrast was administered to allow assessment of the fistula. Vascular/Lymphatic: There is abdominal aortic atherosclerosis without aneurysm. There is no gastrohepatic or hepatoduodenal ligament lymphadenopathy. No intraperitoneal or retroperitoneal lymphadenopy. No pelvic sidewall lymphadenopathy. Reproductive: Uterus is surgically absent. There is no adnexal mass. Other: No intraperitoneal free fluid. Musculoskeletal: Bone windows reveal no worrisome lytic or sclerotic osseous lesions. IMPRESSION: Stable exam. No new or acute interval findings. There is no gas in the region of the chronic enterocutaneous fistula no evidence to suggest interval development of an abscess in this region. Fistula not well assessed given the lack of oral contrast  on today's study. No evidence for bowel obstruction. No intraperitoneal fluid collection and no intraperitoneal free fluid. Electronically Signed   By: Misty Stanley M.D.   On: 07/13/2015 13:27   Dg Chest 2 View  07/13/2015  CLINICAL DATA:  Emesis.  CHF. EXAM: CHEST  2 VIEW COMPARISON:  06/04/2015 chest radiograph FINDINGS: Right PICC terminates in the lower third of the superior vena cava. Stable cardiomediastinal silhouette with mild cardiomegaly. No pneumothorax. No pleural effusion. Clear lungs, with no focal lung consolidation and no pulmonary edema. IMPRESSION: Stable mild cardiomegaly without pulmonary edema. Lungs appear clear. Electronically Signed   By: Ilona Sorrel M.D.   On: 07/13/2015 12:43       Assessment/Plan 1. EC fistula -will have WOC replace an Eakin's pouch and place this to wall suction to try and determine how much output she is actually having.  Restart her sandostatin, which she has been on at the facility.  NPO x ice chips right now -restart TNA and IVFs for nutritional support and hydration 2. ARF -likely secondary to dehyration -IVFs per primary service and TNA to start tomorrow night 3. Multiple medical problems -per medical service  Wetzel Meester E 07/13/2015, 4:10 PM Pager: [phone]

## 2015-07-13 NOTE — Consult Note (Signed)
WOC ostomy consult note Fistula at the midline, patient well known to the [facility] team  Patient has no pouch on at the time of admission, it is apparent that her skin has been exposed to the succus. She has remnants of  Barrier cream around the fistula. Treatment options for stomal/peristomal skin: I used some no sting skin prep on her skin, however it is really denuded and weeping/bleeding Output liquid green succus Ostomy pouching: 1pc Eakin pouch used.  Placed 24 F red robinson catheter into the pouch and placed to LWS.  Requested that staff make sure pouch stays to suction.  Extra Eakin pouch at the bedside.  [place] team will follow along for support with management of fistula.   [place], [place]

## 2015-07-13 NOTE — Progress Notes (Signed)
PARENTERAL NUTRITION CONSULT NOTE - INITIAL  Pharmacy Consult for TPN Indication: EC Fistula  23 yof presented to the ED from [facility] with emesis. She is on a cycled TPN PTA. This will continue while admitted but unable to provide a bag for tonight as the deadline for new TPNs is noon. Per discussion with [facility], will resume TPN tomorrow evening and start D10 at 56ml/hr tonight to provide the patient with some calories. Labs have been ordered and TPN pharmacist will follow-up tomorrow morning.  Salome Arnt, PharmD, BCPS Pager # [phone] 07/13/2015 4:07 PM

## 2015-07-13 NOTE — ED Notes (Signed)
Pt arrives from [facility] rt n/v. Pt has had one episode of emesis. Pt is currently being fed via feeding tube. Pt daughter reported to EMS that pt has been feeling fatigued recently and is allowed to have PO nutrition/hydration, but the pt will not eat or drink. Pt was given phenergan by her facility at 0915. Currently only pt complaint is burning in lower abdomen, pt reports having a fistula placed in her abdomen 4 months ago.

## 2015-07-13 NOTE — Progress Notes (Signed)
Patient refused cpap for tonight. She stated that she wears one at home sometimes but did not want to tonight. RT informed patient to have nurse call RT if she decided she wanted to wear it. RN is aware.

## 2015-07-13 NOTE — ED Notes (Signed)
Wound care RN at bedside  

## 2015-07-13 NOTE — ED Notes (Signed)
Patient transported to CT 

## 2015-07-13 NOTE — ED Notes (Signed)
IV team unable to find who placed PICC line. IV states we must have current xray to verify placement.

## 2015-07-13 NOTE — ED Notes (Signed)
General surgery at bedside. 

## 2015-07-13 NOTE — ED Provider Notes (Signed)
CSN: [id number]     Arrival date & time 07/13/15  1046 History   First MD Initiated Contact with Patient 07/13/15 1049     Chief Complaint  Patient presents with  . Emesis     (Consider location/radiation/quality/duration/timing/severity/associated sxs/prior Treatment) Patient is a 74 y.o. female presenting with vomiting. The history is provided by the patient. No language interpreter was used.  Emesis Severity:  Moderate Duration:  1 day Timing:  Constant Number of daily episodes:  1 Able to tolerate:  Liquids Progression:  Worsening Chronicity:  New Relieved by:  Nothing Worsened by:  Nothing tried Ineffective treatments:  None tried Associated symptoms: abdominal pain   Risk factors: prior abdominal surgery   Risk factors: no sick contacts   Pt vomitted once.   Pt does not take po's  Pt recently had fistula in abdomen.  Pt complains of irritation.   Daughter concerned about healing.  Pouch is not being covered at facility and area is irritated+.  Pt is not receiving any type of hydratione  Past Medical History  Diagnosis Date  . Hypertension   . Hyperlipidemia   . History of DVT (deep vein thrombosis)   . History of colonic polyps   . Gout   . Polyarthritis   . Microcytic anemia   . GERD (gastroesophageal reflux disease)   . Esophagitis   . Hidradenitis suppurativa     s/p axillary sweat gland removal  . H/O blood transfusion reaction     at [facility] hospital  . Diverticulitis   . Difficulty sleeping   . PMB (postmenopausal bleeding)     X 2 YRS  . Bowel trouble     OCCASIONAL BOWEL INCONTINENCE  . Fibroids     s/p TAH/BSO 07/2012  . CAD (coronary artery disease)     a. 8/07 had BMS to OM.  b. In-stent restenosis with later Promus DES to same site.  c. Lexiscan myoview in 1/13 showed EF 67%, no ischemia or infarction.  d. Echo (2/13) showed EF 55-60%, moderate LVH, mild MR.  e. Lex MV 6/14: no isch, EF 53%;  f. Echo 6/14: mild LVH, EF 55-60%, Gr 1 DD, MAC, mild MR,  mild LAE  . Peripheral neuropathy ([place])   . Chronic diastolic CHF (congestive heart failure) ([place]) 06/29/2014  . Renal insufficiency     baseline Cr ~ 1.3  . Chronic kidney disease (CKD), stage III (moderate)     Archie Endo 04/27/2015  . OSA on CPAP     "wear it sometimes" (04/27/2015)  . History of hiatal hernia   . QJJHERDE(081.4)     "weekly" (04/27/2015)  . Enterocutaneous fistula     "have had it 2-3 months now" 04/27/2015  . Type II diabetes mellitus (HCC)     diet controlled: pt's daughter denies mother's hx of diabetes   Past Surgical History  Procedure Laterality Date  . Anterior cervical decomp/discectomy fusion    . Cervical laminectomy    . Umbilical hernia repair    . Axillary hidradenitis excision Bilateral   . Knee arthroscopy Right   . Colectomy Left 2004    left partial for perforation  . Cholecystectomy    . Tubal ligation    . Coronary angioplasty with stent placement      of mid and distal circumflex coronary artery. 1st stent was 04/2006, with drug eluting stent placed on 12/20/07 (30-50% RCA stenosis). Cardiologist Dr. Einar Gip.  . Dilation and curettage of uterus  02/2011  . Hysteroscopy w/d&c  05/04/2012    Procedure: DILATATION AND CURETTAGE /HYSTEROSCOPY;  Surgeon: Lahoma Crocker, MD;  Location: [facility];  Service: Gynecology;  Laterality: N/A;  . Robotic assisted total hysterectomy with bilateral salpingo oopherectomy  07/24/2012    Procedure: ROBOTIC ASSISTED TOTAL HYSTERECTOMY WITH BILATERAL SALPINGO OOPHORECTOMY;  Surgeon: Janie Morning, MD PHD;  Location: [facility];  Service: Gynecology;  Laterality: Bilateral;  attempted ,  converted to abdomnial hysterectomy  . Abdominal hysterectomy  07/24/2012    Procedure: HYSTERECTOMY ABDOMINAL;  Surgeon: Janie Morning, MD PHD;  Location: [facility];  Service: Gynecology;;  . Salpingoophorectomy  07/24/2012    Procedure: SALPINGO OOPHORECTOMY;  Surgeon: Janie Morning, MD PHD;  Location: [facility];  Service: Gynecology;   Laterality: Bilateral;  . Abdominal wound dehiscence  08/01/2012    Procedure: ABDOMINAL WOUND DEHISCENCE;  Surgeon: Lahoma Crocker, MD;  Location: [facility];  Service: Gynecology;  Laterality: N/A;  . Laparotomy  08/02/2012    Procedure: EXPLORATORY LAPAROTOMY;  Surgeon: Imogene Burn. Georgette Dover, MD;  Location: [facility];  Service: General;  Laterality: N/A;  placement of abdominal wound vac  . Colonoscopy N/A 12/22/2014    Procedure: COLONOSCOPY;  Surgeon: Juanita Craver, MD;  Location: [facility];  Service: Endoscopy;  Laterality: N/A;  . Esophagogastroduodenoscopy Left 01/13/2015    Procedure: ESOPHAGOGASTRODUODENOSCOPY (EGD);  Surgeon: Carol Ada, MD;  Location[facility];  Service: Endoscopy;  Laterality: Left;  . Back surgery    . Hernia repair    . Laparotomy N/A 04/28/2015    Procedure: Abdominal Wound Explororation, Explantation of Abdominal Mesh;  Surgeon: Donnie Mesa, MD;  Location: [facility];  Service: General;  Laterality: N/A;  . Cardiac surgery     Family History  Problem Relation Age of Onset  . Diabetes Mother   . Coronary artery disease Mother   . Diabetes Sister   . Coronary artery disease Sister    Social History  Substance Use Topics  . Smoking status: Never Smoker   . Smokeless tobacco: Never Used  . Alcohol Use: No   OB History    No data available     Review of Systems  Gastrointestinal: Positive for vomiting and abdominal pain.  All other systems reviewed and are negative.     Allergies  Review of patient's allergies indicates no known allergies.  Home Medications   Prior to Admission medications   Medication Sig Start Date End Date Taking? Authorizing Provider  acetaminophen (TYLENOL) 325 MG tablet Take 650 mg by mouth every 6 (six) hours as needed.   Yes Historical Provider, MD  colchicine 0.6 MG tablet Take 0.6 mg by mouth daily.   Yes Historical Provider, MD  cyanocobalamin 1000 MCG tablet Take 2,000 mcg by mouth daily.   Yes Historical Provider, MD   cyanocobalamin 2000 MCG tablet Take 1 tablet (2,000 mcg total) by mouth daily. 01/07/15  Yes Janith Lima, MD  dextrose 5 % SOLN 1,000 mL with MVI-12 INJ 10 mL Inject 10 mLs into the vein daily.   Yes Historical Provider, MD  diphenhydrAMINE (BENADRYL) 25 mg capsule Take 25 mg by mouth every 4 (four) hours as needed for itching.   Yes Historical Provider, MD  FeAsp-B12-FA-C-DSS-SuccAc-Zn (FERIVA 21/7) 75-1 MG TABS Take 1 tablet by mouth daily. 04/15/15  Yes Janith Lima, MD  indomethacin (INDOCIN) 50 MG capsule Take 50 mg by mouth 3 (three) times daily with meals.   Yes Historical Provider, MD  insulin aspart (NOVOLOG) 100 UNIT/ML injection Sliding scale  CBG 70 - 120:  0 units: CBG 121 - 150: 2 units; CBG 151 - 200: 3 units; CBG 201 - 250: 5 units; CBG 251 - 300: 8 units;CBG 301 - 350: 11 units; CBG 351 - 400: 15 units; CBG > 400 : 15 units and notify MD Patient taking differently: Inject 2-15 Units into the skin 3 (three) times daily with meals. Sliding scale  CBG 70 - 120: 0 units: CBG 121 - 150: 2 units; CBG 151 - 200: 3 units; CBG 201 - 250: 5 units; CBG 251 - 300: 8 units;CBG 301 - 350: 11 units; CBG 351 - 400: 15 units; CBG > 400 : 15 units and notify MD 06/15/15  Yes Ripudeep K Rai, MD  nystatin (MYCOSTATIN) 100000 UNIT/ML suspension Take 5 mLs (500,000 Units total) by mouth 4 (four) times daily. 06/15/15  Yes Ripudeep Krystal Eaton, MD  octreotide (SANDOSTATIN) 100 MCG/ML SOLN injection Inject 1 mL (100 mcg total) into the skin every 12 (twelve) hours. 06/15/15  Yes Ripudeep Krystal Eaton, MD  oxycodone (OXY-IR) 5 MG capsule Take 5 mg by mouth every 6 (six) hours as needed for pain.   Yes Historical Provider, MD  pantoprazole (PROTONIX) 40 MG tablet Take 1 tablet (40 mg total) by mouth daily. 12/25/14  Yes Reyne Dumas, MD  UNABLE TO FIND ADULT TPN per skilled nursing facility protocol QHS/every night (untreated discontinued by surgery). Patient taking differently: Inject 2,040 mLs into the vein every other day.  ADULT TPN per skilled nursing facility protocol. Infuse over 12 hours 06/15/15  Yes Ripudeep K Rai, MD  VOLTAREN 1 % GEL Apply 2 g topically 4 (four) times daily as needed (pain).  12/25/14  Yes Historical Provider, MD  allopurinol (ZYLOPRIM) 300 MG tablet Take 0.5 tablets (150 mg total) by mouth daily. Patient not taking: Reported on 07/13/2015 06/15/15   Ripudeep Krystal Eaton, MD  methocarbamol (ROBAXIN) 500 MG tablet Take 1 tablet (500 mg total) by mouth every 8 (eight) hours as needed for muscle spasms. 05/21/15   Saverio Danker, PA-C  NITROSTAT 0.4 MG SL tablet DISSOLVE ONE TABLET UNDER THE TONGUE EVERY 5 MINUTES AS NEEDED FOR CHEST PAIN.  DO NOT EXCEED A TOTAL OF 3 DOSES IN 15 MINUTES 03/30/15   Larey Dresser, MD   BP 131/63 mmHg  Pulse 92  Temp(Src) 97.2 F (36.2 C) (Axillary)  Resp 17  SpO2 100% Physical Exam  Constitutional: She is oriented to person, place, and time. She appears well-developed and well-nourished.  HENT:  Head: Normocephalic and atraumatic.  Eyes: Conjunctivae and EOM are normal. Pupils are equal, round, and reactive to light.  Neck: Normal range of motion.  Cardiovascular: Normal rate.   Pulmonary/Chest: Effort normal.  Abdominal: Soft. She exhibits no distension.  Musculoskeletal: Normal range of motion.  Neurological: She is alert and oriented to person, place, and time.  Psychiatric: She has a normal mood and affect.  Nursing note and vitals reviewed.   ED Course  Procedures (including critical care time) Labs Review Labs Reviewed  CBC WITH DIFFERENTIAL/PLATELET - Abnormal; Notable for the following:    Hemoglobin 10.7 (*)    HCT 32.1 (*)    MCV 73.1 (*)    MCH 24.4 (*)    All other components within normal limits  URINALYSIS, ROUTINE W REFLEX MICROSCOPIC (NOT AT [facility]) - Abnormal; Notable for the following:    Hgb urine dipstick LARGE (*)    Protein, ur 30 (*)    Leukocytes, UA LARGE (*)    All other  components within normal limits  URINE MICROSCOPIC-ADD  ON - Abnormal; Notable for the following:    Squamous Epithelial / LPF FEW (*)    Bacteria, UA FEW (*)    Casts GRANULAR CAST (*)    All other components within normal limits  CULTURE, BLOOD (ROUTINE X 2)  CULTURE, BLOOD (ROUTINE X 2)  COMPREHENSIVE METABOLIC PANEL  TROPONIN I  I-STAT CG4 LACTIC ACID, ED    Imaging Review Ct Abdomen Pelvis Wo Contrast  07/13/2015  CLINICAL DATA:  Subsequent encounter for nausea vomiting with low abdominal pain and increased drainage from fistula. EXAM: CT ABDOMEN AND PELVIS WITHOUT CONTRAST TECHNIQUE: Multidetector CT imaging of the abdomen and pelvis was performed following the standard protocol without IV contrast. COMPARISON:  06/04/2015. FINDINGS: Lower chest: Motion artifact with some atelectasis in the dependent bases. Hepatobiliary: No focal abnormality in the liver on this study without intravenous contrast. No evidence of hepatomegaly. Gallbladder is surgically absent. No intrahepatic or extrahepatic biliary dilation. Pancreas: No focal mass lesion. No dilatation of the main duct. No intraparenchymal cyst. No peripancreatic edema. Spleen: No splenomegaly. No focal mass lesion. Adrenals/Urinary Tract: No adrenal nodule or mass. No mass lesion evident in either kidney on this study without intravenous contrast material. No evidence for hydroureteronephrosis. The urinary bladder appears normal for the degree of distention. Stomach/Bowel: Small hiatal hernia noted. Metallic foreign body identified along the posterior wall of the gastric fundus. This is been present on multiple prior studies is well. Duodenum is normally positioned as is the ligament of Treitz. No small bowel wall thickening. No small bowel dilatation. The terminal ileum is normal. The appendix is not visualized, but there is no edema or inflammation in the region of the cecum. Diverticuli are seen scattered along the entire length of the colon without CT findings of diverticulitis. Small bowel  adhesions to the anterior abdominal wall again noted. There is soft tissue attenuation in the midline anterior abdominal wall, at the site of previous surgical wound. This is the area where enterocutaneous fistula was visualized on the previous study. No gas in this region today but no oral contrast was administered to allow assessment of the fistula. Vascular/Lymphatic: There is abdominal aortic atherosclerosis without aneurysm. There is no gastrohepatic or hepatoduodenal ligament lymphadenopathy. No intraperitoneal or retroperitoneal lymphadenopy. No pelvic sidewall lymphadenopathy. Reproductive: Uterus is surgically absent. There is no adnexal mass. Other: No intraperitoneal free fluid. Musculoskeletal: Bone windows reveal no worrisome lytic or sclerotic osseous lesions. IMPRESSION: Stable exam. No new or acute interval findings. There is no gas in the region of the chronic enterocutaneous fistula no evidence to suggest interval development of an abscess in this region. Fistula not well assessed given the lack of oral contrast on today's study. No evidence for bowel obstruction. No intraperitoneal fluid collection and no intraperitoneal free fluid. Electronically Signed   By: Misty Stanley M.D.   On: 07/13/2015 13:27   Dg Chest 2 View  07/13/2015  CLINICAL DATA:  Emesis.  CHF. EXAM: CHEST  2 VIEW COMPARISON:  06/04/2015 chest radiograph FINDINGS: Right PICC terminates in the lower third of the superior vena cava. Stable cardiomediastinal silhouette with mild cardiomegaly. No pneumothorax. No pleural effusion. Clear lungs, with no focal lung consolidation and no pulmonary edema. IMPRESSION: Stable mild cardiomegaly without pulmonary edema. Lungs appear clear. Electronically Signed   By: Ilona Sorrel M.D.   On: 07/13/2015 12:43   I have personally reviewed and evaluated these images and lab results as part of my medical  decision-making.   EKG Interpretation None      MDM  Dr. Molli Posey consulted on wound.   He advised hospitalist admit and he will consult.  Pt is dehydrated Bun and creat are elevated.  Pt's tpn has been stopped.   Pt has not been receiving any fluids.  Nursing home stopped because pt took a small amount of po,    Final diagnoses:  Renal failure  Dehydration  Generalized abdominal pain    [facility] hospitalist will see and admit   Fransico Meadow, PA-C 07/13/15 [place], MD 07/13/15 [id number]

## 2015-07-13 NOTE — H&P (Signed)
[facility] History and Physical                                                                                    Marissa [name], is a 74 y.o. female  MRN: [id number]   DOB - 11/04/1940  Admit Date - 07/13/2015  Outpatient Primary MD for the patient is Marissa Calico, MD  Referring Physician:  Alyse Low, PA-C   Chief Complaint:   Chief Complaint  Patient presents with  . Emesis     HPI  Marissa [name]  is a 74 y.o. female, with chronic kidney disease, obstructive sleep apnea, diastolic heart failure, type 2 diabetes, a history of multiple abdominal surgeries, and a recent discharge after care of an enterocutaneous fistula.  She presents to the emergency department from [facility] with lethargy, 2 days of vomiting and severe dehydration.  On 04/28/15 the patient underwent abdominal wound exploration of enterocutaneous fistula and abscess by Dr. Georgette Dover.  She was admitted to the hospital on 9/22 with increased ostomy output and confusion. She was treated with TPN and Sandostatin.  An eakin pouch was placed.  Once she stabilized she was discharged on 10/4 to SNF.  At this point, the patient is too lethargic to give history and her daughter provides information. Per the patient's daughter, Dr. Georgette Dover felt the patient could begin to take oral food & drink approximately 2 weeks ago. The patient's TPN was discontinued at some point after that. The patient ate fairly well for 4-5 days but then decreased her oral intake. Last week and the patient's enterocutaneous fistula began to drain green bile once again. Her stools became the color of green bile. Yesterday and today the patient has had multiple episodes of vomiting green bile emesis.  In the emergency department the patient's bicarbonate is 12, her creatinine is elevated to 3.44 from 1.75, her BUN is 160, sodium 129. Lactic acid is not elevated at 0.69. U/A appears positive for infection. The emergency department physician consulted  general surgery. The patient will be admitted for acute renal failure, dehydration and lethargy.    Review of Systems  Constitutional: Positive for malaise/fatigue.  HENT: Positive for sore throat.   Eyes: Negative.   Respiratory: Negative.   Cardiovascular: Negative.   Gastrointestinal: Positive for heartburn, nausea, vomiting and abdominal pain.  Genitourinary: Negative.   Musculoskeletal: Negative.   Skin: Negative.   Neurological: Positive for weakness.  Psychiatric/Behavioral: Negative.      Past Medical History  Past Medical History  Diagnosis Date  . Hypertension   . Hyperlipidemia   . History of DVT (deep vein thrombosis)   . History of colonic polyps   . Gout   . Polyarthritis   . Microcytic anemia   . GERD (gastroesophageal reflux disease)   . Esophagitis   . Hidradenitis suppurativa     s/p axillary sweat gland removal  . H/O blood transfusion reaction     at [facility] hospital  . Diverticulitis   . Difficulty sleeping   . PMB (postmenopausal bleeding)     X 2 YRS  . Bowel trouble     OCCASIONAL BOWEL INCONTINENCE  . Fibroids  s/p TAH/BSO 07/2012  . CAD (coronary artery disease)     a. 8/07 had BMS to OM.  b. In-stent restenosis with later Promus DES to same site.  c. Lexiscan myoview in 1/13 showed EF 67%, no ischemia or infarction.  d. Echo (2/13) showed EF 55-60%, moderate LVH, mild MR.  e. Lex MV 6/14: no isch, EF 53%;  f. Echo 6/14: mild LVH, EF 55-60%, Gr 1 DD, MAC, mild MR, mild LAE  . Peripheral neuropathy ([place])   . Chronic diastolic CHF (congestive heart failure) ([place]) 06/29/2014  . Renal insufficiency     baseline Cr ~ 1.3  . Chronic kidney disease (CKD), stage III (moderate)     Archie Endo 04/27/2015  . OSA on CPAP     "wear it sometimes" (04/27/2015)  . History of hiatal hernia   . [id number](478.2)     "weekly" (04/27/2015)  . Enterocutaneous fistula     "have had it 2-3 months now" 04/27/2015  . Type II diabetes mellitus (HCC)     diet  controlled: pt's daughter denies mother's hx of diabetes    Past Surgical History  Procedure Laterality Date  . Anterior cervical decomp/discectomy fusion    . Cervical laminectomy    . Umbilical hernia repair    . Axillary hidradenitis excision Bilateral   . Knee arthroscopy Right   . Colectomy Left 2004    left partial for perforation  . Cholecystectomy    . Tubal ligation    . Coronary angioplasty with stent placement      of mid and distal circumflex coronary artery. 1st stent was 04/2006, with drug eluting stent placed on 12/20/07 (30-50% RCA stenosis). Cardiologist Dr. Einar Gip.  . Dilation and curettage of uterus  02/2011  . Hysteroscopy w/d&c  05/04/2012    Procedure: DILATATION AND CURETTAGE /HYSTEROSCOPY;  Surgeon: Lahoma Crocker, MD;  Location: [facility];  Service: Gynecology;  Laterality: N/A;  . Robotic assisted total hysterectomy with bilateral salpingo oopherectomy  07/24/2012    Procedure: ROBOTIC ASSISTED TOTAL HYSTERECTOMY WITH BILATERAL SALPINGO OOPHORECTOMY;  Surgeon: Janie Morning, MD PHD;  Location: [facility];  Service: Gynecology;  Laterality: Bilateral;  attempted ,  converted to abdomnial hysterectomy  . Abdominal hysterectomy  07/24/2012    Procedure: HYSTERECTOMY ABDOMINAL;  Surgeon: Janie Morning, MD PHD;  Location: [facility];  Service: Gynecology;;  . Salpingoophorectomy  07/24/2012    Procedure: SALPINGO OOPHORECTOMY;  Surgeon: Janie Morning, MD PHD;  Location: [facility];  Service: Gynecology;  Laterality: Bilateral;  . Abdominal wound dehiscence  08/01/2012    Procedure: ABDOMINAL WOUND DEHISCENCE;  Surgeon: Lahoma Crocker, MD;  Location: [facility];  Service: Gynecology;  Laterality: N/A;  . Laparotomy  08/02/2012    Procedure: EXPLORATORY LAPAROTOMY;  Surgeon: Imogene Burn. Georgette Dover, MD;  Location: [facility];  Service: General;  Laterality: N/A;  placement of abdominal wound vac  . Colonoscopy N/A 12/22/2014    Procedure: COLONOSCOPY;  Surgeon: Juanita Craver, MD;  Location: [facility]  ENDOSCOPY;  Service: Endoscopy;  Laterality: N/A;  . Esophagogastroduodenoscopy Left 01/13/2015    Procedure: ESOPHAGOGASTRODUODENOSCOPY (EGD);  Surgeon: Carol Ada, MD;  Location: [facility] ENDOSCOPY;  Service: Endoscopy;  Laterality: Left;  . Back surgery    . Hernia repair    . Laparotomy N/A 04/28/2015    Procedure: Abdominal Wound Explororation, Explantation of Abdominal Mesh;  Surgeon: Donnie Mesa, MD;  Location: [facility];  Service: General;  Laterality: N/A;  . Cardiac surgery        Social History Social History  Substance Use Topics  . Smoking status: Never Smoker   . Smokeless tobacco: Never Used  . Alcohol Use: No    Family History Family History  Problem Relation Age of Onset  . Diabetes Mother   . Coronary artery disease Mother   . Diabetes Sister   . Coronary artery disease Sister     Prior to Admission medications   Medication Sig Start Date End Date Taking? Authorizing Provider  acetaminophen (TYLENOL) 325 MG tablet Take 650 mg by mouth every 6 (six) hours as needed.   Yes Historical Provider, MD  colchicine 0.6 MG tablet Take 0.6 mg by mouth daily.   Yes Historical Provider, MD  cyanocobalamin 1000 MCG tablet Take 2,000 mcg by mouth daily.   Yes Historical Provider, MD  cyanocobalamin 2000 MCG tablet Take 1 tablet (2,000 mcg total) by mouth daily. 01/07/15  Yes Janith Lima, MD  dextrose 5 % SOLN 1,000 mL with MVI-12 INJ 10 mL Inject 10 mLs into the vein daily.   Yes Historical Provider, MD  diphenhydrAMINE (BENADRYL) 25 mg capsule Take 25 mg by mouth every 4 (four) hours as needed for itching.   Yes Historical Provider, MD  FeAsp-B12-FA-C-DSS-SuccAc-Zn (FERIVA 21/7) 75-1 MG TABS Take 1 tablet by mouth daily. 04/15/15  Yes Janith Lima, MD  indomethacin (INDOCIN) 50 MG capsule Take 50 mg by mouth 3 (three) times daily with meals.   Yes Historical Provider, MD  insulin aspart (NOVOLOG) 100 UNIT/ML injection Sliding scale  CBG 70 - 120: 0 units: CBG 121 - 150: 2 units;  CBG 151 - 200: 3 units; CBG 201 - 250: 5 units; CBG 251 - 300: 8 units;CBG 301 - 350: 11 units; CBG 351 - 400: 15 units; CBG > 400 : 15 units and notify MD Patient taking differently: Inject 2-15 Units into the skin 3 (three) times daily with meals. Sliding scale  CBG 70 - 120: 0 units: CBG 121 - 150: 2 units; CBG 151 - 200: 3 units; CBG 201 - 250: 5 units; CBG 251 - 300: 8 units;CBG 301 - 350: 11 units; CBG 351 - 400: 15 units; CBG > 400 : 15 units and notify MD 06/15/15  Yes Ripudeep K Rai, MD  nystatin (MYCOSTATIN) 100000 UNIT/ML suspension Take 5 mLs (500,000 Units total) by mouth 4 (four) times daily. 06/15/15  Yes Ripudeep Krystal Eaton, MD  octreotide (SANDOSTATIN) 100 MCG/ML SOLN injection Inject 1 mL (100 mcg total) into the skin every 12 (twelve) hours. 06/15/15  Yes Ripudeep Krystal Eaton, MD  oxycodone (OXY-IR) 5 MG capsule Take 5 mg by mouth every 6 (six) hours as needed for pain.   Yes Historical Provider, MD  pantoprazole (PROTONIX) 40 MG tablet Take 1 tablet (40 mg total) by mouth daily. 12/25/14  Yes Reyne Dumas, MD  UNABLE TO FIND ADULT TPN per skilled nursing facility protocol QHS/every night (untreated discontinued by surgery). Patient taking differently: Inject 2,040 mLs into the vein every other day. ADULT TPN per skilled nursing facility protocol. Infuse over 12 hours 06/15/15  Yes Ripudeep K Rai, MD  VOLTAREN 1 % GEL Apply 2 g topically 4 (four) times daily as needed (pain).  12/25/14  Yes Historical Provider, MD  allopurinol (ZYLOPRIM) 300 MG tablet Take 0.5 tablets (150 mg total) by mouth daily. Patient not taking: Reported on 07/13/2015 06/15/15   Ripudeep Krystal Eaton, MD  methocarbamol (ROBAXIN) 500 MG tablet Take 1 tablet (500 mg total) by mouth every 8 (eight) hours as needed for  muscle spasms. 05/21/15   Saverio Danker, PA-C  NITROSTAT 0.4 MG SL tablet DISSOLVE ONE TABLET UNDER THE TONGUE EVERY 5 MINUTES AS NEEDED FOR CHEST PAIN.  DO NOT EXCEED A TOTAL OF 3 DOSES IN 15 MINUTES 03/30/15   Larey Dresser,  MD    No Known Allergies  Physical Exam  Vitals  Blood pressure 128/64, pulse 94, temperature 97.2 F (36.2 C), temperature source Axillary, resp. rate 22, SpO2 100 %.   General: Elderly, African-American female lying in bed.  Appears very weak, daughter at bedside  Psych:  Lethargic. Gives appropriate one-word answers to questions  Neuro:   No acute F.N deficits,  follows commands  ENT:  Ears and Eyes appear Normal, Conjunctivae clear, PER. White coating on tongue, dry oral mucosa  Neck:  Supple, No lymphadenopathy appreciated  Respiratory:  Symmetrical chest wall movement, no wheezes crackles or rales heard  Cardiac:  RRR, No Murmurs, no LE edema noted, no JVD.    Abdomen:  Large bandage, evident green drainage underneath.  Skin:  No Cyanosis, Normal Skin Turgor, No Skin Rash or Bruise.  Extremities:  Able to move all 4. 5/5 strength in each,  no effusions.  Data Review  Wt Readings from Last 3 Encounters:  06/15/15 102.7 kg (226 lb 6.6 oz)  05/01/15 99.3 kg (218 lb 14.7 oz)  04/15/15 99.338 kg (219 lb)    CBC  Recent Labs Lab 07/13/15 1335  WBC 8.1  HGB 10.7*  HCT 32.1*  PLT 187  MCV 73.1*  MCH 24.4*  MCHC 33.3  RDW 14.7  LYMPHSABS 0.9  MONOABS 0.7  EOSABS 0.2  BASOSABS 0.0    Chemistries   Recent Labs Lab 07/13/15 1335  NA 129*  K 4.9  CL 106  CO2 12*  GLUCOSE 155*  BUN 160*  CREATININE 3.44*  CALCIUM 9.7  AST 42*  ALT 52  ALKPHOS 176*  BILITOT 0.9       Lab Results  Component Value Date   HGBA1C 5.7* 03/14/2015     Cardiac Enzymes  Recent Labs Lab 07/13/15 1335  TROPONINI <0.03    Urinalysis    Component Value Date/Time   COLORURINE YELLOW 07/13/2015 [place] 07/13/2015 1214   LABSPEC 1.012 07/13/2015 1214   PHURINE 6.5 07/13/2015 [place] 07/13/2015 [place] 03/29/2013 1151   HGBUR LARGE* 07/13/2015 1214   [place] 07/13/2015 [place] 07/13/2015 1214   PROTEINUR 30* 07/13/2015 1214   UROBILINOGEN 0.2 07/13/2015 1214   NITRITE NEGATIVE 07/13/2015 1214   LEUKOCYTESUR LARGE* 07/13/2015 1214    Imaging results:   Ct Abdomen Pelvis Wo Contrast  07/13/2015  CLINICAL DATA:  Subsequent encounter for nausea vomiting with low abdominal pain and increased drainage from fistula. EXAM: CT ABDOMEN AND PELVIS WITHOUT CONTRAST TECHNIQUE: Multidetector CT imaging of the abdomen and pelvis was performed following the standard protocol without IV contrast. COMPARISON:  06/04/2015. FINDINGS: Lower chest: Motion artifact with some atelectasis in the dependent bases. Hepatobiliary: No focal abnormality in the liver on this study without intravenous contrast. No evidence of hepatomegaly. Gallbladder is surgically absent. No intrahepatic or extrahepatic biliary dilation. Pancreas: No focal mass lesion. No dilatation of the main duct. No intraparenchymal cyst. No peripancreatic edema. Spleen: No splenomegaly. No focal mass lesion. Adrenals/Urinary Tract: No adrenal nodule or mass. No mass lesion evident in either kidney on this study without intravenous contrast material. No evidence for hydroureteronephrosis. The  urinary bladder appears normal for the degree of distention. Stomach/Bowel: Small hiatal hernia noted. Metallic foreign body identified along the posterior wall of the gastric fundus. This is been present on multiple prior studies is well. Duodenum is normally positioned as is the ligament of Treitz. No small bowel wall thickening. No small bowel dilatation. The terminal ileum is normal. The appendix is not visualized, but there is no edema or inflammation in the region of the cecum. Diverticuli are seen scattered along the entire length of the colon without CT findings of diverticulitis. Small bowel adhesions to the anterior abdominal wall again noted. There is soft tissue attenuation in the midline anterior abdominal wall, at the site of  previous surgical wound. This is the area where enterocutaneous fistula was visualized on the previous study. No gas in this region today but no oral contrast was administered to allow assessment of the fistula. Vascular/Lymphatic: There is abdominal aortic atherosclerosis without aneurysm. There is no gastrohepatic or hepatoduodenal ligament lymphadenopathy. No intraperitoneal or retroperitoneal lymphadenopy. No pelvic sidewall lymphadenopathy. Reproductive: Uterus is surgically absent. There is no adnexal mass. Other: No intraperitoneal free fluid. Musculoskeletal: Bone windows reveal no worrisome lytic or sclerotic osseous lesions. IMPRESSION: Stable exam. No new or acute interval findings. There is no gas in the region of the chronic enterocutaneous fistula no evidence to suggest interval development of an abscess in this region. Fistula not well assessed given the lack of oral contrast on today's study. No evidence for bowel obstruction. No intraperitoneal fluid collection and no intraperitoneal free fluid. Electronically Signed   By: Misty Stanley M.D.   On: 07/13/2015 13:27   Dg Chest 2 View  07/13/2015  CLINICAL DATA:  Emesis.  CHF. EXAM: CHEST  2 VIEW COMPARISON:  06/04/2015 chest radiograph FINDINGS: Right PICC terminates in the lower third of the superior vena cava. Stable cardiomediastinal silhouette with mild cardiomegaly. No pneumothorax. No pleural effusion. Clear lungs, with no focal lung consolidation and no pulmonary edema. IMPRESSION: Stable mild cardiomegaly without pulmonary edema. Lungs appear clear. Electronically Signed   By: Ilona Sorrel M.D.   On: 07/13/2015 12:43    My personal review of EKG: NSR, No ST changes noted.   Assessment & Plan  Principal Problem:   Acute on chronic renal failure (HCC) Active Problems:   OSA (obstructive sleep apnea)   Essential hypertension   Chronic kidney disease, stage 3   Chronic diastolic CHF (congestive heart failure) (HCC)    Enterocutaneous fistula   Type 2 diabetes mellitus without complication (HCC)   Hyponatremia   Dehydration   Metabolic acidosis   Urinary tract infection   Oral thrush   Acute on chronic renal failure with metabolic acidosis History of chronic kidney disease stage III Likely prerenal secondary to severe dehydration. Will discontinue indomethacin, colchicine and any nephrotoxic medications. Give IV fluids. Monitor bmet.   Enterocutaneous fistula Management per general surgery.  Eakin pouch being placed by WOC.  Sandostatin restarted per surgery. TPN per pharmacy to start on 11/1. Will receive D10 in IVF this evening.   Urinary tract infection UA appears infected. Rocephin started. Follow cultures.   Oral thrush Nystatin swish and swallow 4 times a day   Hyponatremia Secondary to poor intake.  Will monitor after receiving IVF   Diastolic heart failure LVEF of 55% currently hypovolemic.    Obstructive sleep apnea CPAP daily at bedtime   DM II Hgb A1C 5.7 in July.  Will place on SSI - S q 4 hour.  Consultants Called:    Gen. surgery  Family Communication:     Daughter at bedside  Code Status:    DO NOT RESUSCITATE, but pressors and BiPAP are acceptable  Condition:    Guarded  Potential Disposition:   To SNF when improved, estimated 4-5 days  Time spent in minutes : [address]   [facility] Imogene Burn,  [place] on 07/13/2015 at 4:03 PM Between 7am to 7pm - Pager - [phone] After 7pm go to [email or web address] - password [email or web address] And look for the night coverage person covering me after hours

## 2015-07-13 NOTE — ED Notes (Signed)
Xray called and informed pt ready.

## 2015-07-13 NOTE — ED Notes (Signed)
IV team paged to inquire about picc line. Phlebotomy unable to find site for blood draw.

## 2015-07-14 ENCOUNTER — Encounter (HOSPITAL_COMMUNITY): Payer: Self-pay | Admitting: General Practice

## 2015-07-14 LAB — CBC
HEMATOCRIT: 32.3 % — AB (ref 36.0–46.0)
HEMOGLOBIN: 10.8 g/dL — AB (ref 12.0–15.0)
MCH: 24.4 pg — ABNORMAL LOW (ref 26.0–34.0)
MCHC: 33.4 g/dL (ref 30.0–36.0)
MCV: 73.1 fL — ABNORMAL LOW (ref 78.0–100.0)
Platelets: 190 10*3/uL (ref 150–400)
RBC: 4.42 MIL/uL (ref 3.87–5.11)
RDW: 15 % (ref 11.5–15.5)
WBC: 8.5 10*3/uL (ref 4.0–10.5)

## 2015-07-14 LAB — COMPREHENSIVE METABOLIC PANEL
ALT: 57 U/L — AB (ref 14–54)
AST: 46 U/L — AB (ref 15–41)
Albumin: 3 g/dL — ABNORMAL LOW (ref 3.5–5.0)
Alkaline Phosphatase: 191 U/L — ABNORMAL HIGH (ref 38–126)
Anion gap: 12 (ref 5–15)
BILIRUBIN TOTAL: 0.8 mg/dL (ref 0.3–1.2)
BUN: 154 mg/dL — AB (ref 6–20)
CHLORIDE: 105 mmol/L (ref 101–111)
CO2: 11 mmol/L — ABNORMAL LOW (ref 22–32)
CREATININE: 3.5 mg/dL — AB (ref 0.44–1.00)
Calcium: 9.7 mg/dL (ref 8.9–10.3)
GFR calc Af Amer: 14 mL/min — ABNORMAL LOW (ref >60)
GFR, EST NON AFRICAN AMERICAN: 12 mL/min — AB (ref >60)
Glucose, Bld: 160 mg/dL — ABNORMAL HIGH (ref 65–99)
Potassium: 4.1 mmol/L (ref 3.5–5.1)
Sodium: 128 mmol/L — ABNORMAL LOW (ref 135–145)
Total Protein: 8.1 g/dL (ref 6.5–8.1)

## 2015-07-14 LAB — PHOSPHORUS: PHOSPHORUS: 5 mg/dL — AB (ref 2.5–4.6)

## 2015-07-14 LAB — DIFFERENTIAL
BASOS ABS: 0 10*3/uL (ref 0.0–0.1)
Basophils Relative: 0 %
EOS PCT: 2 %
Eosinophils Absolute: 0.2 10*3/uL (ref 0.0–0.7)
LYMPHS PCT: 15 %
Lymphs Abs: 1.3 10*3/uL (ref 0.7–4.0)
MONOS PCT: 9 %
Monocytes Absolute: 0.8 10*3/uL (ref 0.1–1.0)
NEUTROS PCT: 74 %
Neutro Abs: 6.2 10*3/uL (ref 1.7–7.7)

## 2015-07-14 LAB — TRIGLYCERIDES: Triglycerides: 85 mg/dL (ref <150)

## 2015-07-14 LAB — GLUCOSE, CAPILLARY
GLUCOSE-CAPILLARY: 121 mg/dL — AB (ref 65–99)
GLUCOSE-CAPILLARY: 150 mg/dL — AB (ref 65–99)
GLUCOSE-CAPILLARY: 160 mg/dL — AB (ref 65–99)
Glucose-Capillary: 184 mg/dL — ABNORMAL HIGH (ref 65–99)
Glucose-Capillary: 155 mg/dL — ABNORMAL HIGH (ref 65–99)
Glucose-Capillary: 131 mg/dL — ABNORMAL HIGH (ref 65–99)

## 2015-07-14 LAB — HEMOGLOBIN A1C
HEMOGLOBIN A1C: 6.2 % — AB (ref 4.8–5.6)
MEAN PLASMA GLUCOSE: 131 mg/dL

## 2015-07-14 LAB — MAGNESIUM: MAGNESIUM: 2 mg/dL (ref 1.7–2.4)

## 2015-07-14 LAB — PREALBUMIN: Prealbumin: 30.3 mg/dL (ref 18–38)

## 2015-07-14 MED ORDER — FAT EMULSION 20 % IV EMUL
240.0000 mL | INTRAVENOUS | Status: AC
  Administered 2015-07-14: 240 mL via INTRAVENOUS
  Filled 2015-07-14: qty 250

## 2015-07-14 MED ORDER — VANCOMYCIN HCL 10 G IV SOLR: 1500.0000 mg | INTRAVENOUS | Status: DC

## 2015-07-14 MED ORDER — VANCOMYCIN HCL 10 G IV SOLR
1500.0000 mg | INTRAVENOUS | Status: AC
  Administered 2015-07-14: 1500 mg via INTRAVENOUS
  Filled 2015-07-14: qty 1500

## 2015-07-14 MED ORDER — TRACE MINERALS CR-CU-MN-SE-ZN 10-1000-500-60 MCG/ML IV SOLN
INTRAVENOUS | Status: AC
  Administered 2015-07-14: 18:00:00 via INTRAVENOUS
  Filled 2015-07-14: qty 1992

## 2015-07-14 NOTE — Progress Notes (Signed)
PARENTERAL NUTRITION CONSULT NOTE - INITIAL  Pharmacy Consult for TPN Indication: enterocutaneous fistula  No Known Allergies  Patient Measurements:     Vital Signs: Temp: 98.1 F (36.7 C) (11/01 0335) Temp Source: Oral (11/01 0335) BP: 148/57 mmHg (11/01 0335) Pulse Rate: 97 (11/01 0335) Intake/Output from previous day: 10/31 0701 - 11/01 0700 In: 865 [I.V.:865] Out: 0  Intake/Output from this shift:    Labs:  Recent Labs  07/13/15 1335 07/14/15 0420  WBC 8.1 8.5  HGB 10.7* 10.8*  HCT 32.1* 32.3*  PLT 187 190     Recent Labs  07/13/15 1335 07/14/15 0420  NA 129* 128*  K 4.9 4.1  CL 106 105  CO2 12* 11*  GLUCOSE 155* 160*  BUN 160* 154*  CREATININE 3.44* 3.50*  CALCIUM 9.7 9.7  MG  --  2.0  PHOS  --  5.0*  PROT 8.8* 8.1  ALBUMIN 3.0* 3.0*  AST 42* 46*  ALT 52 57*  ALKPHOS 176* 191*  BILITOT 0.9 0.8  PREALBUMIN  --  30.3  TRIG  --  85   CrCl cannot be calculated (Unknown ideal weight.).    Recent Labs  07/14/15 0003 07/14/15 0334 07/14/15 0749  GLUCAP 121* 150* 184*    Insulin Requirements in the past 24 hours:  4 units SSI  Nutritional Goals: per RD assessment from last admission- note dates 06/11/2015 (new consult is entered with note pended at this time) 1700-1900 kCal, 100-110grams of protein per day  Current Nutrition:  Per discussions/notes from [facility], was receiving cycled Clinimix 5/15 alternated with E 5/15, 2056m over 12 hours (2000-0800). [facility] was receiving TPN during her entire stay, however patient's admission labs/dehydrated state as well as report from daughter make this questionable.  Currently receiving D10 @ 1093mhr  Assessment: 89OF on chronic TPN for a fistula [facility], 2 days of vomiting, increased drainage from fistula and severe dehydration. Her EC output was bilious and Eakin's pouch was removed at facility. CT  scan negative for infection.  Surgeries/Procedures: 04/28/15: wound exploration, explanation of abdominal mesh Multiple other abdominal surgeries  GI: Eakin pouch replaced- with suction. Wound care following. Sandostatin resumed. On IV PPI as well Prealbumin 30.3 which indicates decent nutritional status  Endo: A1C this admission is 6.2 (up from 5.7 in July 2016) CBGs 80-184 (on D10 since TPN not started last night). On SSI.  Lytes: Na low at 128. K nml at 4.1, Cl nml at 105, CorCa good at ~10.2, mag nml at 2. Phos high at 5 (Ca x phos product = 51)  Renal: sCr up at 3.5 with BUN 154.   Hepatobil: AST, ALT and alk phos all mildly elevated. Albumin slightly low at 3. Trigs normal at 85  Pulm: 100/RA. CPAP at night- she refused last night  Cards: EF 55%; BP up, HR ok- no meds  Neuro: lethargic   ID: Nystatin for thrush. BCx drawn on admit (10/31) with 1/2 GPC in clusters. Started on vancomycin for r/o bacteremia. Also with possible UTI and started on Rocephin. WBC 8.5, afebrile. Since she is not appearing septic, surgery is not choosing to pursue a line/TPN holiday at this point.  10/31 BCx: 1/2 GPC  Rocephin 10/31>> vanc 11/1>>  Best Practices: subq hep  TPN Access: PICC placed in Sept- 2 ports were not working on admit, but received tPA and now functioning TPN start date: chronic TPN- was on  at home prior to admission in Sept, and continued after discharge. To resume 11/1 at the hospital  Plan: -at 1759, STOP D10% infusion -start TPN with Clinimix E 5/15 at 73m/hr + IVFE 20% at 146mhr- since we are unsure exactly what occurred with her TPN at center PTA (stopped for a few days vs continued the entire time), will not immediately start with cycled TPN. If she tolerates TPN, can go to cycled soon. (Clinimix + fats at this rate provides 99.6g protein and 1894kcal which meets ~100% of previous goals. This is also similar to regimen she was on in the hospital at the beginning of  Oct) -add IV multivitamin and trace elements to TPN -continue CBGs and SSI q4h as ordered -any other fluid requirements deferred to MD -BMET, mag and phos in the morning -will follow for updated RD recommendations, surgery's plans, c/s and need for line holiday   Silva Aamodt D. Latana Colin, PharmD, BCPS Clinical Pharmacist Pager: [phone]/09/2014 9:50 AM

## 2015-07-14 NOTE — Consult Note (Addendum)
WOC wound follow up Pt familiar to [facility] team from previous admission.  Daughter states fistula had healed, was no longer draining, and pt was eating; then it began leaking again this week and wound re-opened. Wound type: Full thickness wound to midline abd with fistula Measurement: 10X4X4cm Wound bed: Dark red, small amt bleeding to wound edges Drainage (amount, consistency, odor) Large amt green drainage in cannister Periwound: Red macerated weeping skin with partial thickness breakdown from moisture associated skin damage related to previous pouch leakage. Affected area approx 20X20cm Dressing procedure/placement/frequency: Applied large Eakin pouch with red rubber catheter to low wall suction to control drainage and promote healing to macerated skin.  Discussed plan of care with pt and daughter at the bedside; they deny further questions. [facility] team will continue to follow.  Supplies at bedside and instructions provided for staff nurses if leakage occurs. Julien Girt MSN, RN, [place], [place], [place]

## 2015-07-14 NOTE — Progress Notes (Signed)
Patient ID: Marissa [name], female   DOB: 10/15/1940, 74 y.o.   MRN: [id number]    Subjective: Pt doesn't feel great, but no new complaints.  Objective: Vital signs in last 24 hours: Temp:  [98.1 F (36.7 C)-98.6 F (37 C)] 98.1 F (36.7 C) (11/01 0335) Pulse Rate:  [88-97] 97 (11/01 0335) Resp:  [16-30] 18 (11/01 0335) BP: (107-163)/(57-88) 148/57 mmHg (11/01 0335) SpO2:  [100 %] 100 % (11/01 0335)    Intake/Output from previous day: 10/31 0701 - 11/01 0700 In: 865 [I.V.:865] Out: 0  Intake/Output this shift:    PE: Abd: soft, still tender due to irritation from previous bile leakage, Eakin's pouch in place with about 375cc since last night of bilious output.  Fistula opening about 1.5cm in width.  Lab Results:   Recent Labs  07/13/15 1335 07/14/15 0420  WBC 8.1 8.5  HGB 10.7* 10.8*  HCT 32.1* 32.3*  PLT 187 190   BMET  Recent Labs  07/13/15 1335 07/14/15 0420  NA 129* 128*  K 4.9 4.1  CL 106 105  CO2 12* 11*  GLUCOSE 155* 160*  BUN 160* 154*  CREATININE 3.44* 3.50*  CALCIUM 9.7 9.7   PT/INR No results for input(s): LABPROT, INR in the last 72 hours. CMP     Component Value Date/Time   NA 128* 07/14/2015 0420   K 4.1 07/14/2015 0420   CL 105 07/14/2015 0420   CO2 11* 07/14/2015 0420   GLUCOSE 160* 07/14/2015 0420   BUN 154* 07/14/2015 0420   CREATININE 3.50* 07/14/2015 0420   CREATININE 1.64* 06/27/2014 1647   CALCIUM 9.7 07/14/2015 0420   PROT 8.1 07/14/2015 0420   ALBUMIN 3.0* 07/14/2015 0420   AST 46* 07/14/2015 0420   ALT 57* 07/14/2015 0420   ALKPHOS 191* 07/14/2015 0420   BILITOT 0.8 07/14/2015 0420   GFRNONAA 12* 07/14/2015 0420   GFRAA 14* 07/14/2015 0420   Lipase     Component Value Date/Time   LIPASE 27 12/26/2014 1815       Studies/Results: Ct Abdomen Pelvis Wo Contrast  07/13/2015  CLINICAL DATA:  Subsequent encounter for nausea vomiting with low abdominal pain and increased drainage from fistula. EXAM: CT ABDOMEN AND  PELVIS WITHOUT CONTRAST TECHNIQUE: Multidetector CT imaging of the abdomen and pelvis was performed following the standard protocol without IV contrast. COMPARISON:  06/04/2015. FINDINGS: Lower chest: Motion artifact with some atelectasis in the dependent bases. Hepatobiliary: No focal abnormality in the liver on this study without intravenous contrast. No evidence of hepatomegaly. Gallbladder is surgically absent. No intrahepatic or extrahepatic biliary dilation. Pancreas: No focal mass lesion. No dilatation of the main duct. No intraparenchymal cyst. No peripancreatic edema. Spleen: No splenomegaly. No focal mass lesion. Adrenals/Urinary Tract: No adrenal nodule or mass. No mass lesion evident in either kidney on this study without intravenous contrast material. No evidence for hydroureteronephrosis. The urinary bladder appears normal for the degree of distention. Stomach/Bowel: Small hiatal hernia noted. Metallic foreign body identified along the posterior wall of the gastric fundus. This is been present on multiple prior studies is well. Duodenum is normally positioned as is the ligament of Treitz. No small bowel wall thickening. No small bowel dilatation. The terminal ileum is normal. The appendix is not visualized, but there is no edema or inflammation in the region of the cecum. Diverticuli are seen scattered along the entire length of the colon without CT findings of diverticulitis. Small bowel adhesions to the anterior abdominal wall again noted. There is  soft tissue attenuation in the midline anterior abdominal wall, at the site of previous surgical wound. This is the area where enterocutaneous fistula was visualized on the previous study. No gas in this region today but no oral contrast was administered to allow assessment of the fistula. Vascular/Lymphatic: There is abdominal aortic atherosclerosis without aneurysm. There is no gastrohepatic or hepatoduodenal ligament lymphadenopathy. No intraperitoneal  or retroperitoneal lymphadenopy. No pelvic sidewall lymphadenopathy. Reproductive: Uterus is surgically absent. There is no adnexal mass. Other: No intraperitoneal free fluid. Musculoskeletal: Bone windows reveal no worrisome lytic or sclerotic osseous lesions. IMPRESSION: Stable exam. No new or acute interval findings. There is no gas in the region of the chronic enterocutaneous fistula no evidence to suggest interval development of an abscess in this region. Fistula not well assessed given the lack of oral contrast on today's study. No evidence for bowel obstruction. No intraperitoneal fluid collection and no intraperitoneal free fluid. Electronically Signed   By: Misty Stanley M.D.   On: 07/13/2015 13:27   Dg Chest 2 View  07/13/2015  CLINICAL DATA:  Emesis.  CHF. EXAM: CHEST  2 VIEW COMPARISON:  06/04/2015 chest radiograph FINDINGS: Right PICC terminates in the lower third of the superior vena cava. Stable cardiomediastinal silhouette with mild cardiomegaly. No pneumothorax. No pleural effusion. Clear lungs, with no focal lung consolidation and no pulmonary edema. IMPRESSION: Stable mild cardiomegaly without pulmonary edema. Lungs appear clear. Electronically Signed   By: Ilona Sorrel M.D.   On: 07/13/2015 12:43    Anti-infectives: Anti-infectives    Start     Dose/Rate Route Frequency Ordered Stop   07/16/15 1000  vancomycin (VANCOCIN) 1,500 mg in sodium chloride 0.9 % 500 mL IVPB     1,500 mg 250 mL/hr over 120 Minutes Intravenous Every 48 hours 07/14/15 0956     07/14/15 1500  cefTRIAXone (ROCEPHIN) 1 g in dextrose 5 % 50 mL IVPB     1 g 100 mL/hr over 30 Minutes Intravenous Every 24 hours 07/13/15 1647     07/14/15 1015  vancomycin (VANCOCIN) 1,500 mg in sodium chloride 0.9 % 500 mL IVPB     1,500 mg 250 mL/hr over 120 Minutes Intravenous NOW 07/14/15 0943 07/15/15 1015   07/13/15 1400  cefTRIAXone (ROCEPHIN) 1 g in dextrose 5 % 50 mL IVPB     1 g 100 mL/hr over 30 Minutes Intravenous   Once 07/13/15 1359 07/13/15 1526       Assessment/Plan  1. EC fistula -Eakin's pouch in place with about 375cc of bilious output since last night.  I asked the RN to make sure the output was being documented in the I&Os -PICC will be exchanged today and restarted on her TNA.  One of her blood cultures grew G+Cocci, but given no WBC or fever, no definite need for a holiday right now. -cont sandostatin at 170mcg q 12h for now -cont NPO 2. ARF -likely secondary to dehyration -IVFs per primary service and TNA to start tonight 3. Multiple medical problems -per medical service   LOS: 1 day    Mc Bloodworth E 07/14/2015, 11:30 AM Pager: [phone]

## 2015-07-14 NOTE — Progress Notes (Signed)
300 cc of greenish drainage noted in suction container since yesterday night.

## 2015-07-14 NOTE — Progress Notes (Signed)
CRITICAL VALUE ALERT  Critical value received:  Gram positive cocci in clusters  Date of notification:  07/14/2015  Time of notification:  6415  Critical value read back:Yes.    Nurse who received alert:  Arlyss Queen  MD notified (1st page):  Lamar Blinks NP  Time of first page:  [phone]

## 2015-07-14 NOTE — Progress Notes (Signed)
ANTIBIOTIC CONSULT NOTE - INITIAL  Pharmacy Consult for vancomycin Indication: r/o bacteremia  No Known Allergies  Patient Measurements: 102.7 kg 10/3  Vital Signs: Temp: 98.1 F (36.7 C) (11/01 0335) Temp Source: Oral (11/01 0335) BP: 148/57 mmHg (11/01 0335) Pulse Rate: 97 (11/01 0335) Intake/Output from previous day: 10/31 0701 - 11/01 0700 In: 865 [I.V.:865] Out: 0  Intake/Output from this shift:    Labs:  Recent Labs  07/13/15 1335 07/14/15 0420  WBC 8.1 8.5  HGB 10.7* 10.8*  PLT 187 190  CREATININE 3.44* 3.50*   Norm CrCl ~ 16 ml/min  CrCl cannot be calculated (Unknown ideal weight.). No results for input(s): VANCOTROUGH, VANCOPEAK, VANCORANDOM, GENTTROUGH, GENTPEAK, GENTRANDOM, TOBRATROUGH, TOBRAPEAK, TOBRARND, AMIKACINPEAK, AMIKACINTROU, AMIKACIN in the last 72 hours.   Microbiology: Recent Results (from the past 720 hour(s))  Culture, Urine     Status: None (Preliminary result)   Collection Time: 07/13/15 12:14 PM  Result Value Ref Range Status   Specimen Description URINE, RANDOM  Final   Special Requests ADDED 2258  Final   Culture TOO YOUNG TO READ  Final   Report Status PENDING  Incomplete  Blood culture (routine x 2)     Status: None (Preliminary result)   Collection Time: 07/13/15  2:50 PM  Result Value Ref Range Status   Specimen Description BLOOD PICC LINE  Final   Special Requests BOTTLES DRAWN AEROBIC AND ANAEROBIC 5ML  Final   Culture  Setup Time   Final    GRAM POSITIVE COCCI IN CLUSTERS IN BOTH AEROBIC AND ANAEROBIC BOTTLES CRITICAL RESULT CALLED TO, READ BACK BY AND VERIFIED WITH: TO BQIU(RN) BY TCLEVELAND 07/14/2015 AT 5:58 AM    Culture PENDING  Incomplete   Report Status PENDING  Incomplete    Medical History: Past Medical History  Diagnosis Date  . Hypertension   . Hyperlipidemia   . History of DVT (deep vein thrombosis)   . History of colonic polyps   . Gout   . Polyarthritis   . Microcytic anemia   . GERD  (gastroesophageal reflux disease)   . Esophagitis   . Hidradenitis suppurativa     s/p axillary sweat gland removal  . H/O blood transfusion reaction     at [facility] hospital  . Diverticulitis   . Difficulty sleeping   . PMB (postmenopausal bleeding)     X 2 YRS  . Bowel trouble     OCCASIONAL BOWEL INCONTINENCE  . Fibroids     s/p TAH/BSO 07/2012  . CAD (coronary artery disease)     a. 8/07 had BMS to OM.  b. In-stent restenosis with later Promus DES to same site.  c. Lexiscan myoview in 1/13 showed EF 67%, no ischemia or infarction.  d. Echo (2/13) showed EF 55-60%, moderate LVH, mild MR.  e. Lex MV 6/14: no isch, EF 53%;  f. Echo 6/14: mild LVH, EF 55-60%, Gr 1 DD, MAC, mild MR, mild LAE  . Peripheral neuropathy ([place])   . Chronic diastolic CHF (congestive heart failure) ([place]) 06/29/2014  . Renal insufficiency     baseline Cr ~ 1.3  . Chronic kidney disease (CKD), stage III (moderate)     Archie Endo 04/27/2015  . OSA on CPAP     "wear it sometimes" (04/27/2015)  . History of hiatal hernia   . WCHENIDP(824.2)     "weekly" (04/27/2015)  . Enterocutaneous fistula     "have had it 2-3 months now" 04/27/2015  . Type II diabetes mellitus ([place])  diet controlled: pt's daughter denies mother's hx of diabetes    Medications:  Prescriptions prior to admission  Medication Sig Dispense Refill Last Dose  . acetaminophen (TYLENOL) 325 MG tablet Take 650 mg by mouth every 6 (six) hours as needed.   07/07/2015 at Unknown time  . colchicine 0.6 MG tablet Take 0.6 mg by mouth daily.   07/12/2015 at Unknown time  . cyanocobalamin 1000 MCG tablet Take 2,000 mcg by mouth daily.   07/12/2015 at Unknown time  . cyanocobalamin 2000 MCG tablet Take 1 tablet (2,000 mcg total) by mouth daily. 90 tablet 3 Past Month at Unknown time  . dextrose 5 % SOLN 1,000 mL with MVI-12 INJ 10 mL Inject 10 mLs into the vein daily.   07/12/2015 at Unknown time  . diphenhydrAMINE (BENADRYL) 25 mg capsule Take 25 mg by mouth  every 4 (four) hours as needed for itching.   07/06/2015 at Unknown time  . FeAsp-B12-FA-C-DSS-SuccAc-Zn (FERIVA 21/7) 75-1 MG TABS Take 1 tablet by mouth daily. 28 tablet 11 07/12/2015 at Unknown time  . indomethacin (INDOCIN) 50 MG capsule Take 50 mg by mouth 3 (three) times daily with meals.   07/12/2015 at Unknown time  . insulin aspart (NOVOLOG) 100 UNIT/ML injection Sliding scale  CBG 70 - 120: 0 units: CBG 121 - 150: 2 units; CBG 151 - 200: 3 units; CBG 201 - 250: 5 units; CBG 251 - 300: 8 units;CBG 301 - 350: 11 units; CBG 351 - 400: 15 units; CBG > 400 : 15 units and notify MD (Patient taking differently: Inject 2-15 Units into the skin 3 (three) times daily with meals. Sliding scale  CBG 70 - 120: 0 units: CBG 121 - 150: 2 units; CBG 151 - 200: 3 units; CBG 201 - 250: 5 units; CBG 251 - 300: 8 units;CBG 301 - 350: 11 units; CBG 351 - 400: 15 units; CBG > 400 : 15 units and notify MD) 10 mL 11 07/12/2015 at Unknown time  . nystatin (MYCOSTATIN) 100000 UNIT/ML suspension Take 5 mLs (500,000 Units total) by mouth 4 (four) times daily. 60 mL 0 07/12/2015 at Unknown time  . octreotide (SANDOSTATIN) 100 MCG/ML SOLN injection Inject 1 mL (100 mcg total) into the skin every 12 (twelve) hours. 60 mL 0 07/12/2015 at Unknown time  . oxycodone (OXY-IR) 5 MG capsule Take 5 mg by mouth every 6 (six) hours as needed for pain.   07/10/2015 at Unknown time  . pantoprazole (PROTONIX) 40 MG tablet Take 1 tablet (40 mg total) by mouth daily. 30 tablet 2 07/12/2015 at Unknown time  . UNABLE TO FIND ADULT TPN per skilled nursing facility protocol QHS/every night (untreated discontinued by surgery). (Patient taking differently: Inject 2,040 mLs into the vein every other day. ADULT TPN per skilled nursing facility protocol. Infuse over 12 hours)   07/11/2015 at Unknown time  . VOLTAREN 1 % GEL Apply 2 g topically 4 (four) times daily as needed (pain).    07/09/2015 at Unknown time  . methocarbamol (ROBAXIN) 500 MG  tablet Take 1 tablet (500 mg total) by mouth every 8 (eight) hours as needed for muscle spasms. 40 tablet 0 unknown at unknown  . NITROSTAT 0.4 MG SL tablet DISSOLVE ONE TABLET UNDER THE TONGUE EVERY 5 MINUTES AS NEEDED FOR CHEST PAIN.  DO NOT EXCEED A TOTAL OF 3 DOSES IN 15 MINUTES 100 tablet 0 unknown at unknown   Assessment: 74 y/o female with an enterocutaneous fistula on TPN PTA admitted  from ED with emesis, ARF, dehydration, and lethargy. Blood cultures drawn yesterday show 1 with GPC in clusters. Pharmacy consulted to begin vancomycin for r/o bacteremia. Patient has CKD with SCr in the 1.7s in early October, today SCr is 3.5. She is afebrile and WBC are normal. This could represent contaminant.  Goal of Therapy:  Vancomycin trough level 15-20 mcg/ml  Plan:  - Vancomycin 1500 mg IV now then q48h - Follow-up height/weight and renal function, adjust dose as needed  - Follow-up culture data and clinical progress - Trough as clinically indicated  [facility], Pharm.D., BCPS Clinical Pharmacist Pager: [phone] 07/14/2015 9:27 AM

## 2015-07-14 NOTE — Progress Notes (Addendum)
Initial Nutrition Assessment  DOCUMENTATION CODES:   Obesity unspecified  INTERVENTION:   TPN per pharmacy.  Monitor magnesium, potassium, and phosphorus daily for at least 3 days, MD to replete as needed, as pt is at risk for refeeding syndrome.  RD to continue to monitor.    NUTRITION DIAGNOSIS:   Inadequate oral intake related to inability to eat as evidenced by NPO status.  GOAL:   Patient will meet greater than or equal to 90% of their needs  MONITOR:   Diet advancement, Weight trends, Labs, I & O's, Skin  REASON FOR ASSESSMENT:   Consult New TPN/TNA  ASSESSMENT:   74 y.o. 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   She presents to the emergency department from [facility] with lethargy, 2 days of vomiting and severe dehydration. Patient began to take oral food & drink approximately 2 weeks ago. The patient's TPN was discontinued at some point after that. The patient ate fairly well for 4-5 days but then decreased her oral intake. Last week the patient's enterocutaneous fistula began to drain green bile once again. Her stools became the color of green bile. Pt with eakin's pouch to suction.   Pt is currently NPO for plans of initiating TPN today at 6pm. Per Pharmacy note, pt was receiving cycled Clinimix 5/15 alternated with E 5/15, 2035ml over 12 hours. Patient was receiving TPN during her entire stay, however patient's admission labs/dehydrated state as well as report from daughter make this questionable.  No family present at bedside during time of visit. Pt reports she has not eaten anything in the past couple of days. Pt lethargic. RD unable to figure out if pt was receiving TPN during those days. Pt at risk for possible refeeding syndrome. Plans to draw magnesium and phosphorous labs tomorrow morning. Pt  reports usual body weight of ~230 lbs. Noted no recent weight recorded. Pt was weighed on bed scale which revealed 212 lbs. Pt with a 6% weight loss in 1 month. RD to monitor closely.   Plans to start Clinimix E 5/15 at 44mL/hr + IVFE 20% at 61mL/hr which provides 1894 kcal, 100 grams of protein (100% of needs).   Pt with no observed significant fat or muscle mass loss.   Labs: Low sodium, CO2, GFR. High BUN, creatinine, AST, ALT, alkaline phosphatase, and phosphorous (5.0).  Diet Order:  Diet NPO time specified Except for: Sips with Meds TPN (CLINIMIX-E) Adult  Skin:   (Eakin's pouch to suction on abdomen)  Last BM:  PTA  Height:   Ht Readings from Last 1 Encounters:  06/11/15 5\' 4"  (1.626 m)    Weight:   Wt Readings from Last 1 Encounters:  06/15/15 226 lb 6.6 oz (102.7 kg)  11/1- bed scale: 212 lbs (96.36 kg)  Ideal Body Weight:  54.5 kg  BMI:  Body Mass Index: 36.64 kg/(m^2)  Estimated Nutritional Needs:   Kcal:  1700-1900  Protein:  100-110 grams  Fluid:  1.7-1.9 L/day  EDUCATION NEEDS:   No education needs identified at this time  Corrin Parker, MS, RD, LDN Pager # [phone] After hours/ weekend pager # [phone]

## 2015-07-14 NOTE — Progress Notes (Signed)
[facility]                                                                              Patient Demographics  Marissa [name], is a 74 y.o. female, DOB - August 28, 1941, UXL:[id number]  Admit date - 07/13/2015   Admitting Physician Waldemar Dickens, MD  Outpatient Primary MD for the patient is [name], MD  LOS - 1   Chief Complaint  Patient presents with  . Emesis       Brief HPI   Per Dr. Bertis Ruddy admit note on 07/13/15 Marissa [name] is a 74 y.o. female, with chronic kidney disease, obstructive sleep apnea, diastolic heart failure, type 2 diabetes, a history of multiple abdominal surgeries, and a recent discharge after care of an enterocutaneous fistula. She presents to the emergency department from [facility] with lethargy, 2 days of vomiting and severe dehydration. On 04/28/15 the patient underwent abdominal wound exploration of enterocutaneous fistula and abscess by Dr. Georgette Dover. She was admitted to the hospital on 9/22 with increased ostomy output and confusion. She was treated with TPN and Sandostatin. An eakin pouch was placed. Once she stabilized she was discharged on 10/4 to SNF. At this point, the patient is too lethargic to give history and her daughter provides information. Per the patient's daughter, Dr. Georgette Dover felt the patient could begin to take oral food & drink approximately 2 weeks ago. The patient's TPN was discontinued at some point after that. The patient ate fairly well for 4-5 days but then decreased her oral intake. Last week and the patient's enterocutaneous fistula began to drain green bile once again. Her stools became the color of green bile. Yesterday and today the patient has had multiple episodes of vomiting green bile emesis. In the emergency department the patient's bicarbonate is 12, her creatinine is elevated to 3.44 from 1.75, her BUN is 160, sodium 129. Lactic acid is not elevated at 0.69. U/A appears positive for infection.  The emergency department physician consulted general surgery. The patient will be admitted for acute renal failure, dehydration and lethargy.  Assessment & Plan    Principal Problem: Acute on chronic renal failure with metabolic acidosis, due to dehydration:History of chronic kidney disease stage III - Likely prerenal secondary to severe dehydration due to volume depletion, UTI.  -  continue gentle hydration, follow creatinine  - Indomethacin, colchicine DC'd avoid any nephrotoxic medications  Active problems Chronic Enterocutaneous fistula s/p Eakin pouch, difficult seal - Appreciate general surgery following  - Per general surgery, continue NPO status, IV fluids, Sandostatin  - Eakin pouch placed to intermittent wall suction  - Wound care to also follow patient  GPC bacteremia 1/2 - Follow blood cultures and sensitivities, place on IV vancomycin  Urinary tract infection Follow urine culture and sensitivities, placed on IV Rocephin  Oral thrush Nystatin swish and swallow 4 times a day  Hyponatremia Secondary to poor intake, continue gentle hydration, follow BMET  Chronic Diastolic heart failure LVEF of 55% currently hypovolemic, on gentle hydration, avoid fluid overload   Obstructive sleep apnea CPAP daily at bedtime  Diabetes mellitus type 2 -Continue sliding scale insulin -  Hemoglobin A1c 6.2  Code Status: DO NOT RESUSCITATE  Family Communication: Discussed in detail with the patient, all imaging results, lab results explained to the patient    Disposition Plan:   Time Spent in minutes   25 minutes  Procedures  None  Consults   General surgery Wound care  DVT Prophylaxis heparin subcutaneous   Medications  Scheduled Meds: . cefTRIAXone (ROCEPHIN)  IV  1 g Intravenous Q24H  . heparin  5,000 Units Subcutaneous 3 times per day  . insulin aspart  0-9 Units Subcutaneous 6 times per day  . nystatin  5 mL Oral QID  . octreotide  100 mcg Subcutaneous Q12H   . pantoprazole (PROTONIX) IV  40 mg Intravenous Q24H   Continuous Infusions: . dextrose 100 mL/hr at 07/14/15 0720   PRN Meds:.alum & mag hydroxide-simeth, diclofenac sodium, diphenhydrAMINE, Glycerin (Adult), morphine injection, nitroGLYCERIN, ondansetron **OR** ondansetron (ZOFRAN) IV, oxyCODONE, sodium chloride   Antibiotics   Anti-infectives    Start     Dose/Rate Route Frequency Ordered Stop   07/14/15 1500  cefTRIAXone (ROCEPHIN) 1 g in dextrose 5 % 50 mL IVPB     1 g 100 mL/hr over 30 Minutes Intravenous Every 24 hours 07/13/15 1647     07/13/15 1400  cefTRIAXone (ROCEPHIN) 1 g in dextrose 5 % 50 mL IVPB     1 g 100 mL/hr over 30 Minutes Intravenous  Once 07/13/15 1359 07/13/15 1526        Subjective:   Marissa [name] was seen and examined today.Eakin pouch, to intermittent wall suction.  Patient denies dizziness, chest pain, shortness of breath,  new weakness, numbess, tingling. No acute events overnight.    Objective:   Blood pressure 148/57, pulse 97, temperature 98.1 F (36.7 C), temperature source Oral, resp. rate 18, SpO2 100 %.  Wt Readings from Last 3 Encounters:  06/15/15 102.7 kg (226 lb 6.6 oz)  05/01/15 99.3 kg (218 lb 14.7 oz)  04/15/15 99.338 kg (219 lb)     Intake/Output Summary (Last 24 hours) at 07/14/15 0900 Last data filed at 07/14/15 0600  Gross per 24 hour  Intake 864.99 ml  Output      0 ml  Net 864.99 ml    Exam  General: Drowsy/lethargic but arousable, oriented, gives one-word answers,  NAD, weak and frail  HEENT:  PERRLA, EOMI, Anicteric Sclera, mucous membranes moist.   Neck: Supple, no JVD, no masses  CVS: S1 S2 auscultated, no rubs, murmurs or gallops. Regular rate and rhythm.  Respiratory: Clear to auscultation bilaterally, no wheezing, rales or rhonchi  Abdomen: EC fistula, with bilious drainage, tender, pouch to intermittent wall suction   Ext: no cyanosis clubbing or edema  Neuro: moving all 4  extremities  Psych: somewhat lethargic, drowsy but arousable   Data Review   Micro Results Recent Results (from the past 240 hour(s))  Blood culture (routine x 2)     Status: None (Preliminary result)   Collection Time: 07/13/15  2:50 PM  Result Value Ref Range Status   Specimen Description BLOOD PICC LINE  Final   Special Requests BOTTLES DRAWN AEROBIC AND ANAEROBIC 5ML  Final   Culture  Setup Time   Final    GRAM POSITIVE COCCI IN CLUSTERS IN BOTH AEROBIC AND ANAEROBIC BOTTLES CRITICAL RESULT CALLED TO, READ BACK BY AND VERIFIED WITH: TO BQIU(RN) BY TCLEVELAND 07/14/2015 AT 5:58 AM    Culture PENDING  Incomplete   Report Status PENDING  Incomplete    Radiology  Reports Ct Abdomen Pelvis Wo Contrast  07/13/2015  CLINICAL DATA:  Subsequent encounter for nausea vomiting with low abdominal pain and increased drainage from fistula. EXAM: CT ABDOMEN AND PELVIS WITHOUT CONTRAST TECHNIQUE: Multidetector CT imaging of the abdomen and pelvis was performed following the standard protocol without IV contrast. COMPARISON:  06/04/2015. FINDINGS: Lower chest: Motion artifact with some atelectasis in the dependent bases. Hepatobiliary: No focal abnormality in the liver on this study without intravenous contrast. No evidence of hepatomegaly. Gallbladder is surgically absent. No intrahepatic or extrahepatic biliary dilation. Pancreas: No focal mass lesion. No dilatation of the main duct. No intraparenchymal cyst. No peripancreatic edema. Spleen: No splenomegaly. No focal mass lesion. Adrenals/Urinary Tract: No adrenal nodule or mass. No mass lesion evident in either kidney on this study without intravenous contrast material. No evidence for hydroureteronephrosis. The urinary bladder appears normal for the degree of distention. Stomach/Bowel: Small hiatal hernia noted. Metallic foreign body identified along the posterior wall of the gastric fundus. This is been present on multiple prior studies is well.  Duodenum is normally positioned as is the ligament of Treitz. No small bowel wall thickening. No small bowel dilatation. The terminal ileum is normal. The appendix is not visualized, but there is no edema or inflammation in the region of the cecum. Diverticuli are seen scattered along the entire length of the colon without CT findings of diverticulitis. Small bowel adhesions to the anterior abdominal wall again noted. There is soft tissue attenuation in the midline anterior abdominal wall, at the site of previous surgical wound. This is the area where enterocutaneous fistula was visualized on the previous study. No gas in this region today but no oral contrast was administered to allow assessment of the fistula. Vascular/Lymphatic: There is abdominal aortic atherosclerosis without aneurysm. There is no gastrohepatic or hepatoduodenal ligament lymphadenopathy. No intraperitoneal or retroperitoneal lymphadenopy. No pelvic sidewall lymphadenopathy. Reproductive: Uterus is surgically absent. There is no adnexal mass. Other: No intraperitoneal free fluid. Musculoskeletal: Bone windows reveal no worrisome lytic or sclerotic osseous lesions. IMPRESSION: Stable exam. No new or acute interval findings. There is no gas in the region of the chronic enterocutaneous fistula no evidence to suggest interval development of an abscess in this region. Fistula not well assessed given the lack of oral contrast on today's study. No evidence for bowel obstruction. No intraperitoneal fluid collection and no intraperitoneal free fluid. Electronically Signed   By: Misty Stanley M.D.   On: 07/13/2015 13:27   Dg Chest 2 View  07/13/2015  CLINICAL DATA:  Emesis.  CHF. EXAM: CHEST  2 VIEW COMPARISON:  06/04/2015 chest radiograph FINDINGS: Right PICC terminates in the lower third of the superior vena cava. Stable cardiomediastinal silhouette with mild cardiomegaly. No pneumothorax. No pleural effusion. Clear lungs, with no focal lung  consolidation and no pulmonary edema. IMPRESSION: Stable mild cardiomegaly without pulmonary edema. Lungs appear clear. Electronically Signed   By: Ilona Sorrel M.D.   On: 07/13/2015 12:43    CBC  Recent Labs Lab 07/13/15 1335 07/14/15 0420  WBC 8.1 8.5  HGB 10.7* 10.8*  HCT 32.1* 32.3*  PLT 187 190  MCV 73.1* 73.1*  MCH 24.4* 24.4*  MCHC 33.3 33.4  RDW 14.7 15.0  LYMPHSABS 0.9 1.3  MONOABS 0.7 0.8  EOSABS 0.2 0.2  BASOSABS 0.0 0.0    Chemistries   Recent Labs Lab 07/13/15 1335 07/14/15 0420  NA 129* 128*  K 4.9 4.1  CL 106 105  CO2 12* 11*  GLUCOSE 155* 160*  BUN 160* 154*  CREATININE 3.44* 3.50*  CALCIUM 9.7 9.7  MG  --  2.0  AST 42* 46*  ALT 52 57*  ALKPHOS 176* 191*  BILITOT 0.9 0.8   ------------------------------------------------------------------------------------------------------------------ CrCl cannot be calculated (Unknown ideal weight.). ------------------------------------------------------------------------------------------------------------------  Recent Labs  07/13/15 1335  HGBA1C 6.2*   ------------------------------------------------------------------------------------------------------------------  Recent Labs  07/14/15 0420  TRIG 85   ------------------------------------------------------------------------------------------------------------------ No results for input(s): TSH, T4TOTAL, T3FREE, THYROIDAB in the last 72 hours.  Invalid input(s): FREET3 ------------------------------------------------------------------------------------------------------------------ No results for input(s): VITAMINB12, FOLATE, FERRITIN, TIBC, IRON, RETICCTPCT in the last 72 hours.  Coagulation profile No results for input(s): INR, PROTIME in the last 168 hours.  No results for input(s): DDIMER in the last 72 hours.  Cardiac Enzymes  Recent Labs Lab 07/13/15 1335  TROPONINI <0.03    ------------------------------------------------------------------------------------------------------------------ Invalid input(s): POCBNP   Recent Labs  07/13/15 1856 07/13/15 2118 07/14/15 0003 07/14/15 0334 07/14/15 0749  GLUCAP 83 80 121* 150* 184*     RAI,RIPUDEEP M.D. [facility] 07/14/2015, 9:00 AM  Pager: [phone] Between 7am to 7pm - call Pager - [phone]  After 7pm go to [email or web address] - password [email or web address]  Call night coverage person covering after 7pm

## 2015-07-14 NOTE — Progress Notes (Signed)
Pt. Refused cpap. RT informed pt. To notify if she changes her mind. 

## 2015-07-14 NOTE — Evaluation (Signed)
Occupational Therapy Evaluation Patient Details Name: Marissa [name] MRN: [id number] DOB: Feb 06, 1941 Today's Date: 07/14/2015    History of Present Illness 74 y.o. female presenting with emesis. Pt was admitted on 9/22 with AMS, abdominal wounds and pain, dehydration and weakness. PMH: Chronic kidney disease, obstructive sleep apnea, diastolic heart failure, DMII, HTN and Hidradenitis suppurativa.   Clinical Impression   Pt admitted to hospital due to reason stated above. Pt currently with functional limitiations due to the deficits listed below (see OT problem list). Prior to admission pt was receiving assistance for bathing and dressing from staff at SNF. Pt currently set up to total assistance with ADLs. Pt educated in pressure relief and skin checks to decrease risk of decubitus ulcers. Pt reported burning pain of 10/10 in stomach area, RN notified. Pt will benefit from skilled OT to maximize her independence and safety with ADLs and balance to allow discharge to venue listed below.    Follow Up Recommendations  SNF    Equipment Recommendations  Other (comment) (Defer to next venue)    Recommendations for Other Services       Precautions / Restrictions Precautions Precautions: Fall Precaution Comments: wound pouch in place mid-stomach region Restrictions Weight Bearing Restrictions: No      Mobility Bed Mobility Overal bed mobility: Needs Assistance Bed Mobility: Rolling Rolling: Max assist         General bed mobility comments: Pt required verbal cues for sequence and hand placement in order to perform rolling for skin checks  Transfers                 General transfer comment: pt declined performing transfer at this time, requested to stay in bed    Balance                                            ADL Overall ADL's : Needs assistance/impaired     Grooming: Oral care;Set up;Bed level                                  General ADL Comments: Pt is set up to total assist for ADLs. Pt able to roll right to left in order to perform skin checks and pressure relief to decrease risk of decubitis ulcers.     Vision     Perception     Praxis      Pertinent Vitals/Pain Pain Assessment: 0-10 Pain Score: 6  Pain Location: stomach Pain Descriptors / Indicators: Burning Pain Intervention(s): Patient requesting pain meds-RN notified;Repositioned;Monitored during session     Hand Dominance Right   Extremity/Trunk Assessment Upper Extremity Assessment Upper Extremity Assessment: Overall WFL for tasks assessed   Lower Extremity Assessment Lower Extremity Assessment: Defer to PT evaluation       Communication     Cognition Arousal/Alertness: Lethargic Behavior During Therapy: [facility] for tasks assessed/performed;Flat affect Overall Cognitive Status: Within Functional Limits for tasks assessed                     General Comments       Exercises       Shoulder Instructions      Home Living Family/patient expects to be discharged to:: Skilled nursing facility  Prior Functioning/Environment Level of Independence: Needs assistance    ADL's / Homemaking Assistance Needed: Pt reports recieving assistance from SNF staff for bathing and dressing        OT Diagnosis: Generalized weakness;Acute pain   OT Problem List: Decreased strength;Decreased activity tolerance;Impaired balance (sitting and/or standing);Decreased knowledge of use of DME or AE;Pain   OT Treatment/Interventions: Self-care/ADL training;Therapeutic exercise;Energy conservation;DME and/or AE instruction;Therapeutic activities;Patient/family education    OT Goals(Current goals can be found in the care plan section) Acute Rehab OT Goals Patient Stated Goal: to feel better OT Goal Formulation: With patient Time For Goal Achievement: 07/28/15 Potential to Achieve Goals:  Fair ADL Goals Pt Will Perform Grooming: sitting;with set-up Pt Will Transfer to Toilet: with +2 assist;with total assist;squat pivot transfer;bedside commode Pt Will Perform Toileting - Clothing Manipulation and hygiene: with mod assist;sitting/lateral leans Pt/caregiver will Perform Home Exercise Program: Increased strength;Both right and left upper extremity;With theraband;With Supervision Additional ADL Goal #1: Pt will be +2 mod A for bed mobility for precursor to ADL participation  OT Frequency: Min 2X/week   Barriers to D/C:            Co-evaluation              End of Session Nurse Communication: Patient requests pain meds  Activity Tolerance: Patient limited by lethargy;Patient limited by pain Patient left: in bed;with call bell/phone within reach   Time: 0942-0958 OT Time Calculation (min): 16 min Charges:  OT General Charges $OT Visit: 1 Procedure OT Evaluation $Initial OT Evaluation Tier I: 1 Procedure G-Codes:    Lin Landsman 02-Aug-2015, 10:24 AM

## 2015-07-14 NOTE — Evaluation (Signed)
Physical Therapy Evaluation Patient Details Name: Marissa [name] MRN: [id number] DOB: 1940-09-20 Today's Date: 07/14/2015   History of Present Illness  74 y.o. female presenting with emesis. Pt was admitted on 9/22 with AMS, abdominal wounds and pain, dehydration and weakness. PMH: Chronic kidney disease, obstructive sleep apnea, diastolic heart failure, DMII, HTN and Hidradenitis suppurativa.  Clinical Impression  Pt admitted with above and presenting with extreme debilitation and lethargy. Pt requiring maxA for all transfers and mobility. Pt to remains appropriate to return to SNF upon d/c for maximal functional recovery.    Follow Up Recommendations SNF;Supervision/Assistance - 24 hour    Equipment Recommendations  None recommended by PT    Recommendations for Other Services       Precautions / Restrictions Precautions Precautions: Fall Precaution Comments: wound pouch in place mid-stomach region Restrictions Weight Bearing Restrictions: No      Mobility  Bed Mobility Overal bed mobility: Needs Assistance Bed Mobility: Rolling;Sidelying to Sit;Sit to Sidelying Rolling: Max assist Sidelying to sit: Max assist;+2 for physical assistance     Sit to sidelying: Max assist;+2 for physical assistance General bed mobility comments: max directional v/c's, assist for trunk elevation to EOB and LE management back into bed  Transfers Overall transfer level: Needs assistance Equipment used:  (2 person lift) Transfers: Sit to/from Stand Sit to Stand: Mod assist;+2 physical assistance         General transfer comment: increased time, assist to power up, modA to maintain standing  Ambulation/Gait             General Gait Details: unable to attempt at this time, abd wound hooked up to suction on wall  Stairs            Wheelchair Mobility    Modified Rankin (Stroke Patients Only)       Balance Overall balance assessment: Needs assistance Sitting-balance  support: Feet supported;Bilateral upper extremity supported Sitting balance-Leahy Scale: Poor     Standing balance support: Bilateral upper extremity supported Standing balance-Leahy Scale: Poor                               Pertinent Vitals/Pain Pain Assessment: 0-10 Pain Score: 6  Pain Location: stomach Pain Descriptors / Indicators: Burning    Home Living Family/patient expects to be discharged to:: Skilled nursing facility                      Prior Function Level of Independence: Needs assistance   Gait / Transfers Assistance Needed: pt reports she was using a walker and amb with PT  ADL's / Homemaking Assistance Needed: Pt reports recieving assistance from SNF staff for bathing and dressing  Comments: occasionally uses RW     Hand Dominance   Dominant Hand: Right    Extremity/Trunk Assessment   Upper Extremity Assessment: Generalized weakness           Lower Extremity Assessment: Generalized weakness      Cervical / Trunk Assessment:  (lage abdominal wound)  Communication   Communication: Expressive difficulties (soft spoken)  Cognition Arousal/Alertness: Lethargic Behavior During Therapy: WFL for tasks assessed/performed;Flat affect Overall Cognitive Status: Within Functional Limits for tasks assessed                      General Comments General comments (skin integrity, edema, etc.): pt with request ot urinate, PT and RN assist pt and pt dependent for  hygiene. pt did assist with rollin    Exercises        Assessment/Plan    PT Assessment Patient needs continued PT services  PT Diagnosis Difficulty walking;Generalized weakness;Acute pain   PT Problem List Decreased strength;Decreased range of motion;Decreased activity tolerance;Decreased balance;Decreased mobility  PT Treatment Interventions DME instruction;Gait training;Functional mobility training;Therapeutic activities;Therapeutic exercise;Balance training   PT  Goals (Current goals can be found in the Care Plan section) Acute Rehab PT Goals Patient Stated Goal: to feel better PT Goal Formulation: With patient Time For Goal Achievement: 07/28/15 Potential to Achieve Goals: Fair    Frequency Min 2X/week   Barriers to discharge        Co-evaluation               End of Session   Activity Tolerance: Patient limited by fatigue Patient left: in bed;with call bell/phone within reach;with bed alarm set Nurse Communication: Mobility status         Time: 7412-8786 PT Time Calculation (min) (ACUTE ONLY): 23 min   Charges:   PT Evaluation $Initial PT Evaluation Tier I: 1 Procedure PT Treatments $Therapeutic Activity: 8-22 mins   PT G CodesKingsley Callander 07/14/2015, 1:54 PM   Kittie Plater, PT, DPT Pager #: [phone] Office #: [phone]

## 2015-07-15 ENCOUNTER — Other Ambulatory Visit (HOSPITAL_COMMUNITY): Payer: Medicare Other

## 2015-07-15 DIAGNOSIS — Z95828 Presence of other vascular implants and grafts: Secondary | ICD-10-CM

## 2015-07-15 DIAGNOSIS — N189 Chronic kidney disease, unspecified: Secondary | ICD-10-CM

## 2015-07-15 DIAGNOSIS — B957 Other staphylococcus as the cause of diseases classified elsewhere: Secondary | ICD-10-CM

## 2015-07-15 DIAGNOSIS — K632 Fistula of intestine: Secondary | ICD-10-CM

## 2015-07-15 DIAGNOSIS — E46 Unspecified protein-calorie malnutrition: Secondary | ICD-10-CM

## 2015-07-15 DIAGNOSIS — Z79899 Other long term (current) drug therapy: Secondary | ICD-10-CM

## 2015-07-15 LAB — BASIC METABOLIC PANEL
ANION GAP: 13 (ref 5–15)
BUN: 140 mg/dL — ABNORMAL HIGH (ref 6–20)
CALCIUM: 9.7 mg/dL (ref 8.9–10.3)
CO2: 13 mmol/L — AB (ref 22–32)
CREATININE: 3.21 mg/dL — AB (ref 0.44–1.00)
Chloride: 104 mmol/L (ref 101–111)
GFR, EST AFRICAN AMERICAN: 15 mL/min — AB (ref >60)
GFR, EST NON AFRICAN AMERICAN: 13 mL/min — AB (ref >60)
Glucose, Bld: 174 mg/dL — ABNORMAL HIGH (ref 65–99)
Potassium: 4 mmol/L (ref 3.5–5.1)
SODIUM: 130 mmol/L — AB (ref 135–145)

## 2015-07-15 LAB — GLUCOSE, CAPILLARY
GLUCOSE-CAPILLARY: 135 mg/dL — AB (ref 65–99)
GLUCOSE-CAPILLARY: 140 mg/dL — AB (ref 65–99)
Glucose-Capillary: 152 mg/dL — ABNORMAL HIGH (ref 65–99)
Glucose-Capillary: 169 mg/dL — ABNORMAL HIGH (ref 65–99)
Glucose-Capillary: 180 mg/dL — ABNORMAL HIGH (ref 65–99)
Glucose-Capillary: 135 mg/dL — ABNORMAL HIGH (ref 65–99)

## 2015-07-15 LAB — MAGNESIUM: MAGNESIUM: 2.3 mg/dL (ref 1.7–2.4)

## 2015-07-15 LAB — PHOSPHORUS: Phosphorus: 5.6 mg/dL — ABNORMAL HIGH (ref 2.5–4.6)

## 2015-07-15 MED ORDER — VANCOMYCIN HCL IN DEXTROSE 1-5 GM/200ML-% IV SOLN
1000.0000 mg | INTRAVENOUS | Status: DC
  Administered 2015-07-16 – 2015-07-18 (×2): 1000 mg via INTRAVENOUS
  Filled 2015-07-15 (×3): qty 200

## 2015-07-15 MED ORDER — TRACE MINERALS CR-CU-MN-SE-ZN 10-1000-500-60 MCG/ML IV SOLN
INTRAVENOUS | Status: AC
  Administered 2015-07-15: 17:00:00 via INTRAVENOUS
  Filled 2015-07-15: qty 1992

## 2015-07-15 MED ORDER — FAT EMULSION 20 % IV EMUL
240.0000 mL | INTRAVENOUS | Status: AC
  Administered 2015-07-15: 240 mL via INTRAVENOUS
  Filled 2015-07-15: qty 250

## 2015-07-15 NOTE — Consult Note (Addendum)
[facility] for Infectious Disease       Reason for Consult: CoNS bacteremia    Referring Physician: Dr. Tana Coast  Principal Problem:   Acute on chronic renal failure ([place]) Active Problems:   OSA (obstructive sleep apnea)   Essential hypertension   Chronic kidney disease, stage 3   Chronic diastolic CHF (congestive heart failure) (HCC)   Enterocutaneous fistula   Type 2 diabetes mellitus without complication (HCC)   Hyponatremia   Dehydration   Metabolic acidosis   Urinary tract infection   Oral thrush   . cefTRIAXone (ROCEPHIN)  IV  1 g Intravenous Q24H  . heparin  5,000 Units Subcutaneous 3 times per day  . insulin aspart  0-9 Units Subcutaneous 6 times per day  . nystatin  5 mL Oral QID  . octreotide  100 mcg Subcutaneous Q12H  . pantoprazole (PROTONIX) IV  40 mg Intravenous Q24H  . [START ON 07/16/2015] vancomycin  1,000 mg Intravenous Q48H    Recommendations: Treat for 7 days (vancomycin or cefazolin) Ok to leave in picc line for CoNS unless repeat blood cultures again positive Repeat blood cultures OK to continue TPN   Assessment: She has a picc line and is TPN dependent and presented 10/31 with lethargy and dehydration and blood cultures drawn, now positive for CoNS.  Unlike Staph aureus, this is much less virulent and ok to treat through without removal of lines and short course of antibiotics sufficient.  No indication for echo (unless persistently positive blood cultures).   Also with malnutrition, on TPN.   Antibiotics: vancomycin  HPI: Marissa [name] is a 74 y.o. female with CKD, multiple abdominal surgeries and enterocutaneous fistula who presented with n/v, lethargy.  No fever, normal WBC at 8.1,  Lactate < 2 with a picc line for TPN last placed 8/15.  No issues with line.  WBC has remained wnl.  The patient had been off of TPN for a short time and initially eating well but then 1 week prior to admission noted bile out of EC fistula.  Some associated  nausea/vomiting.  Not responsive to questions then or now.   CT abd independently reviewed and no abscess noted.  Previous hospitalization noted and had encephalopathy then, resolved.    Review of Systems:  Unable to be assessed due to mental status All other systems reviewed and are negative   Past Medical History  Diagnosis Date  . Hypertension   . Hyperlipidemia   . History of DVT (deep vein thrombosis)   . History of colonic polyps   . Gout   . Polyarthritis   . Microcytic anemia   . GERD (gastroesophageal reflux disease)   . Esophagitis   . Hidradenitis suppurativa     s/p axillary sweat gland removal  . H/O blood transfusion reaction     at [facility] hospital  . Diverticulitis   . Difficulty sleeping   . PMB (postmenopausal bleeding)     X 2 YRS  . Bowel trouble     OCCASIONAL BOWEL INCONTINENCE  . Fibroids     s/p TAH/BSO 07/2012  . CAD (coronary artery disease)     a. 8/07 had BMS to OM.  b. In-stent restenosis with later Promus DES to same site.  c. Lexiscan myoview in 1/13 showed EF 67%, no ischemia or infarction.  d. Echo (2/13) showed EF 55-60%, moderate LVH, mild MR.  e. Lex MV 6/14: no isch, EF 53%;  f. Echo 6/14: mild LVH, EF 55-60%, Gr  1 DD, MAC, mild MR, mild LAE  . Peripheral neuropathy ([place])   . Chronic diastolic CHF (congestive heart failure) ([place]) 06/29/2014  . Renal insufficiency     baseline Cr ~ 1.3  . Chronic kidney disease (CKD), stage III (moderate)     [facility] Endo 04/27/2015  . OSA on CPAP     "wear it sometimes" (04/27/2015)  . History of hiatal hernia   . ZJQBHALP(379.0)     "weekly" (04/27/2015)  . Enterocutaneous fistula     "have had it 2-3 months now" 04/27/2015  . Type II diabetes mellitus (HCC)     diet controlled: pt's daughter denies mother's hx of diabetes    Social History  Substance Use Topics  . Smoking status: Never Smoker   . Smokeless tobacco: Never Used  . Alcohol Use: No    Family History  Problem Relation Age of Onset  .  Diabetes Mother   . Coronary artery disease Mother   . Diabetes Sister   . Coronary artery disease Sister     No Known Allergies  Physical Exam: Constitutional: in no apparent distress  Filed Vitals:   07/15/15 1000  BP: 116/71  Pulse: 95  Temp: 98.6 F (37 C)  Resp: 18   EYES: anicteric ENMT: Cardiovascular: Cor RRR Respiratory: clear bilateral, normal effort GI: Bowel sounds are normal Musculoskeletal: peripheral pulses normal, no pedal edema, no clubbing or cyanosis Skin: negatives: no rash Hematologic: no cervical lad  Lab Results  Component Value Date   WBC 8.5 07/14/2015   HGB 10.8* 07/14/2015   HCT 32.3* 07/14/2015   MCV 73.1* 07/14/2015   PLT 190 07/14/2015    Lab Results  Component Value Date   CREATININE 3.21* 07/15/2015   BUN 140* 07/15/2015   NA 130* 07/15/2015   K 4.0 07/15/2015   CL 104 07/15/2015   CO2 13* 07/15/2015    Lab Results  Component Value Date   ALT 57* 07/14/2015   AST 46* 07/14/2015   ALKPHOS 191* 07/14/2015     Microbiology: Recent Results (from the past 240 hour(s))  Culture, Urine     Status: None (Preliminary result)   Collection Time: 07/13/15 12:14 PM  Result Value Ref Range Status   Specimen Description URINE, RANDOM  Final   Special Requests ADDED 2258  Final   Culture >=100,000 COLONIES/mL GRAM NEGATIVE RODS  Final   Report Status PENDING  Incomplete  Blood culture (routine x 2)     Status: None (Preliminary result)   Collection Time: 07/13/15  2:50 PM  Result Value Ref Range Status   Specimen Description BLOOD PICC LINE  Final   Special Requests BOTTLES DRAWN AEROBIC AND ANAEROBIC 5ML  Final   Culture  Setup Time   Final    GRAM POSITIVE COCCI IN CLUSTERS IN BOTH AEROBIC AND ANAEROBIC BOTTLES CRITICAL RESULT CALLED TO, READ BACK BY AND VERIFIED WITH: TO BQIU(RN) BY TCLEVELAND 07/14/2015 AT 5:58 AM    Culture STAPHYLOCOCCUS SPECIES (COAGULASE NEGATIVE)  Final   Report Status PENDING  Incomplete  Blood culture  (routine x 2)     Status: None (Preliminary result)   Collection Time: 07/13/15  2:55 PM  Result Value Ref Range Status   Specimen Description BLOOD LEFT HAND  Final   Special Requests   Final    BOTTLES DRAWN AEROBIC AND ANAEROBIC BLUE 5CC, RED 3CC   Culture  Setup Time   Final    GRAM POSITIVE COCCI IN CLUSTERS AEROBIC BOTTLE ONLY CRITICAL RESULT  CALLED TO, READ BACK BY AND VERIFIED WITH: J NARAMDAS,RN AT 1122 07/14/15 BY L BENFIELD    Culture STAPHYLOCOCCUS SPECIES (COAGULASE NEGATIVE)  Final   Report Status PENDING  Incomplete    Scharlene Gloss, [facility] [place] for Infectious Disease [facility] [email or web address] [phone] pager  [phone] cell 07/15/2015, 4:06 PM

## 2015-07-15 NOTE — Consult Note (Signed)
WOC wound follow up Current pouch is leaking behind the barrier. Wound type: Full thickness wound to midline abd with fistula Wound bed: Dark red, small amt bleeding to wound edges Drainage (amount, consistency, odor) Large amt green drainage in cannister Periwound: Red macerated weeping skin with partial thickness breakdown from moisture associated skin damage related to previous pouch leakage; has greatly improved since yesterday.  Dressing procedure/placement/frequency: Applied ostomy powder, then skin prep to protect skin, then large Eakin pouch with red rubber catheter to low wall suction to control drainage and promote healing to macerated skin. Dressing change much less painful than yesterday. [place] team will continue to follow. Supplies at bedside and instructions provided for staff nurses if leakage occurs. Julien Girt MSN, RN, [place], [place], [place]

## 2015-07-15 NOTE — Progress Notes (Signed)
PARENTERAL NUTRITION CONSULT NOTE  Pharmacy Consult for TPN Indication: enterocutaneous fistula  No Known Allergies  Patient Measurements: Height: 5\' 4"  (162.6 cm) Weight: 208 lb 8.9 oz (94.6 kg) IBW/kg (Calculated) : 54.7   Vital Signs: Temp: 98.6 F (37 C) (11/02 1000) Temp Source: Oral (11/02 1000) BP: 116/71 mmHg (11/02 1000) Pulse Rate: 95 (11/02 1000) Intake/Output from previous day: 11/01 0701 - 11/02 0700 In: 387.5 [TPN:387.5] Out: 300  Intake/Output from this shift:    Labs:  Recent Labs  07/13/15 1335 07/14/15 0420  WBC 8.1 8.5  HGB 10.7* 10.8*  HCT 32.1* 32.3*  PLT 187 190     Recent Labs  07/13/15 1335 07/14/15 0420 07/15/15 0420  NA 129* 128* 130*  K 4.9 4.1 4.0  CL 106 105 104  CO2 12* 11* 13*  GLUCOSE 155* 160* 174*  BUN 160* 154* 140*  CREATININE 3.44* 3.50* 3.21*  CALCIUM 9.7 9.7 9.7  MG  --  2.0 2.3  PHOS  --  5.0* 5.6*  PROT 8.8* 8.1  --   ALBUMIN 3.0* 3.0*  --   AST 42* 46*  --   ALT 52 57*  --   ALKPHOS 176* 191*  --   BILITOT 0.9 0.8  --   PREALBUMIN  --  30.3  --   TRIG  --  85  --    Estimated Creatinine Clearance: 17.2 mL/min (by C-G formula based on Cr of 3.21).    Recent Labs  07/15/15 0407 07/15/15 0801 07/15/15 1140  GLUCAP 152* 169* 180*    Insulin Requirements in the past 24 hours:  10 units SSI  Nutritional Goals: per RD note 11/1 1700-1900 kCal, 100-110 grams of protein per day  Current Nutrition:  Clinimix E 5/15 at 74mL/hr + IVFE 20% at 85mL/hr-provides 99g protein and 1894kcal which meets ~100% of previous goals  Per discussions/notes from [facility], was receiving cycled Clinimix 5/15 alternated with E 5/15, 2049ml over 12 hours (2000-0800). Per RN from facility [place], patient was receiving TPN during her entire stay, however patient's admission labs/dehydrated state as well as report from daughter make this questionable.  Assessment: 42 YOF on chronic TPN for a fistula who was  admitted from [facility] with lethargy, 2 days of vomiting, increased drainage from fistula and severe dehydration. Her EC output was bilious and Eakin's pouch was removed at facility. CT scan negative for infection.  Surgeries/Procedures: 04/28/15: wound exploration, explanation of abdominal mesh Multiple other abdominal surgeries  GI: Eakin pouch replaced- with suction. Wound care following. Sandostatin resumed. On IV PPI as well Prealbumin 30.3 which is normal. With weight loss over the past month.   Endo: A1C this admission is 6.2 (up from 5.7 in July 2016) CBGs 131-181- On SSI.  Lytes: Na low at 130. K nml at 4, CorCa good at ~10.2, mag nml at 2.. Phos remains high at 5.6- no refeeding noted (Ca x phos product = 57)  Renal: sCr up at 3.5 with BUN 154.   Hepatobil: AST, ALT and alk phos all mildly elevated. Albumin slightly low at 3. Trigs normal at 85  Pulm: 100/RA. CPAP at night- she refused last night  Cards: EF 55%; BP up, HR ok- no meds  Neuro: lethargic   ID: Nystatin for thrush. Started on vancomycin for r/o bacteremia. Also with possible UTI and started on Rocephin. WBC 8.5, afebrile. Spoke with Dr. Linus Salmons and he is ok with surgery's plan to change out PICC and treat  with antibiotics while continuing TPN.  10/31 BCx: 2/2 CoNS  Rocephin 10/31>> vanc 11/1>>  Best Practices: subq hep  TPN Access: PICC placed in Sept-  Plan to switch out PICC as likely source for bacteremia TPN start date: chronic TPN- was on at home prior to admission in Sept, and continued after discharge. To resume 11/1 at the hospital  Plan: -changed TPN to Clinimix NO LYTES 5/15- continue at 12mL/hr + IVFE 20% at 107mL/hr- provides 99g protein and 1894kcal which meets ~100% of goals. -IV multivitamin and trace elements in TPN -continue CBGs and SSI q4h as ordered -any other fluid requirements deferred to MD -full TPN labs as per protocol  Chaise Mahabir D. Brentton Wardlow, PharmD, BCPS Clinical  Pharmacist Pager: [phone] 07/15/2015 1:01 PM

## 2015-07-15 NOTE — Progress Notes (Signed)
ANTIBIOTIC CONSULT NOTE - FOLLOW UP  Pharmacy Consult for vancomycin Indication: bacteremia  No Known Allergies  Patient Measurements: Height: 5\' 4"  (162.6 cm) Weight: 208 lb 8.9 oz (94.6 kg) IBW/kg (Calculated) : 54.7  Vital Signs: Temp: 98.6 F (37 C) (11/02 1000) Temp Source: Oral (11/02 1000) BP: 116/71 mmHg (11/02 1000) Pulse Rate: 95 (11/02 1000) Intake/Output from previous day: 11/01 0701 - 11/02 0700 In: 387.5 [TPN:387.5] Out: 300  Intake/Output from this shift:    Labs:  Recent Labs  07/13/15 1335 07/14/15 0420 07/15/15 0420  WBC 8.1 8.5  --   HGB 10.7* 10.8*  --   PLT 187 190  --   CREATININE 3.44* 3.50* 3.21*   Estimated Creatinine Clearance: 17.2 mL/min (by C-G formula based on Cr of 3.21). No results for input(s): VANCOTROUGH, VANCOPEAK, VANCORANDOM, GENTTROUGH, GENTPEAK, GENTRANDOM, TOBRATROUGH, TOBRAPEAK, TOBRARND, AMIKACINPEAK, AMIKACINTROU, AMIKACIN in the last 72 hours.   Microbiology: Recent Results (from the past 720 hour(s))  Culture, Urine     Status: None (Preliminary result)   Collection Time: 07/13/15 12:14 PM  Result Value Ref Range Status   Specimen Description URINE, RANDOM  Final   Special Requests ADDED 2258  Final   Culture TOO YOUNG TO READ  Final   Report Status PENDING  Incomplete  Blood culture (routine x 2)     Status: None (Preliminary result)   Collection Time: 07/13/15  2:50 PM  Result Value Ref Range Status   Specimen Description BLOOD PICC LINE  Final   Special Requests BOTTLES DRAWN AEROBIC AND ANAEROBIC 5ML  Final   Culture  Setup Time   Final    GRAM POSITIVE COCCI IN CLUSTERS IN BOTH AEROBIC AND ANAEROBIC BOTTLES CRITICAL RESULT CALLED TO, READ BACK BY AND VERIFIED WITH: TO BQIU(RN) BY TCLEVELAND 07/14/2015 AT 5:58 AM    Culture NO GROWTH < 24 HOURS  Final   Report Status PENDING  Incomplete  Blood culture (routine x 2)     Status: None (Preliminary result)   Collection Time: 07/13/15  2:55 PM  Result Value Ref  Range Status   Specimen Description BLOOD LEFT HAND  Final   Special Requests   Final    BOTTLES DRAWN AEROBIC AND ANAEROBIC BLUE 5CC, RED 3CC   Culture  Setup Time   Final    GRAM POSITIVE COCCI IN CLUSTERS AEROBIC BOTTLE ONLY CRITICAL RESULT CALLED TO, READ BACK BY AND VERIFIED WITH: J NARAMDAS,RN AT 1122 07/14/15 BY L BENFIELD    Culture GRAM POSITIVE COCCI  Final   Report Status PENDING  Incomplete    Anti-infectives    Start     Dose/Rate Route Frequency Ordered Stop   07/16/15 1000  vancomycin (VANCOCIN) 1,500 mg in sodium chloride 0.9 % 500 mL IVPB  Status:  Discontinued     1,500 mg 250 mL/hr over 120 Minutes Intravenous Every 48 hours 07/14/15 0956 07/15/15 1035   07/16/15 1000  vancomycin (VANCOCIN) IVPB 1000 mg/200 mL premix     1,000 mg 200 mL/hr over 60 Minutes Intravenous Every 48 hours 07/15/15 1035     07/14/15 1500  cefTRIAXone (ROCEPHIN) 1 g in dextrose 5 % 50 mL IVPB     1 g 100 mL/hr over 30 Minutes Intravenous Every 24 hours 07/13/15 1647     07/14/15 1015  vancomycin (VANCOCIN) 1,500 mg in sodium chloride 0.9 % 500 mL IVPB     1,500 mg 250 mL/hr over 120 Minutes Intravenous NOW 07/14/15 0943 07/14/15 1210  07/13/15 1400  cefTRIAXone (ROCEPHIN) 1 g in dextrose 5 % 50 mL IVPB     1 g 100 mL/hr over 30 Minutes Intravenous  Once 07/13/15 1359 07/13/15 1526      Assessment: 74 y/o female with an enterocutaneous fistula on TPN PTA admitted from ED with emesis, ARF, dehydration, and lethargy. She continues on vancomycin for bacteremia. Now both blood cultures with GPC in clusters, awaiting speciation and sensitivities. Patient has CKD with SCr in the 1.7s in early October, today SCr is down to 3.21, UOP not recorded. She is afebrile and WBC are normal.   Goal of Therapy:  Vancomycin trough level 15-20 mcg/ml  Plan:  - Decrease vancomycin to 1000 mg IV q48h for updated weight - Monitor renal function and culture data - Trough at steady-state  [facility],  [place].D., BCPS Clinical Pharmacist Pager: [phone] 07/15/2015 10:37 AM

## 2015-07-15 NOTE — Progress Notes (Signed)
[facility] Progress Note     Subjective: Very sleepy, will barely talk to me.  Says she has no pain or nausea.  No complaints.    Objective: Vital signs in last 24 hours: Temp:  [97.5 F (36.4 C)-97.8 F (36.6 C)] 97.8 F (36.6 C) (11/02 0410) Pulse Rate:  [76-98] 98 (11/02 0410) Resp:  [16-18] 18 (11/02 0410) BP: (118-132)/(64-72) 132/64 mmHg (11/02 0410) SpO2:  [97 %-100 %] 100 % (11/02 0410) Weight:  [94.303 kg (207 lb 14.4 oz)-94.6 kg (208 lb 8.9 oz)] 94.6 kg (208 lb 8.9 oz) (11/01 2019)    Intake/Output from previous day: 11/01 0701 - 11/02 0700 In: 387.5 [TPN:387.5] Out: 300  Intake/Output this shift:    PE: Gen:  Alert, NAD, pleasant Abd: Soft, ND, NT, some BS, no HSM, Midline wound mostly beefy red but with bile staining, fistula opening about 1.5cm in width.   Lab Results:   Recent Labs  07/13/15 1335 07/14/15 0420  WBC 8.1 8.5  HGB 10.7* 10.8*  HCT 32.1* 32.3*  PLT 187 190   BMET  Recent Labs  07/14/15 0420 07/15/15 0420  NA 128* 130*  K 4.1 4.0  CL 105 104  CO2 11* 13*  GLUCOSE 160* 174*  BUN 154* 140*  CREATININE 3.50* 3.21*  CALCIUM 9.7 9.7   PT/INR No results for input(s): LABPROT, INR in the last 72 hours. CMP     Component Value Date/Time   NA 130* 07/15/2015 0420   K 4.0 07/15/2015 0420   CL 104 07/15/2015 0420   CO2 13* 07/15/2015 0420   GLUCOSE 174* 07/15/2015 0420   BUN 140* 07/15/2015 0420   CREATININE 3.21* 07/15/2015 0420   CREATININE 1.64* 06/27/2014 1647   CALCIUM 9.7 07/15/2015 0420   PROT 8.1 07/14/2015 0420   ALBUMIN 3.0* 07/14/2015 0420   AST 46* 07/14/2015 0420   ALT 57* 07/14/2015 0420   ALKPHOS 191* 07/14/2015 0420   BILITOT 0.8 07/14/2015 0420   GFRNONAA 13* 07/15/2015 0420   GFRAA 15* 07/15/2015 0420   Lipase     Component Value Date/Time   LIPASE 27 12/26/2014 1815       Studies/Results: Ct Abdomen Pelvis Wo Contrast  07/13/2015  CLINICAL DATA:  Subsequent encounter for nausea  vomiting with low abdominal pain and increased drainage from fistula. EXAM: CT ABDOMEN AND PELVIS WITHOUT CONTRAST TECHNIQUE: Multidetector CT imaging of the abdomen and pelvis was performed following the standard protocol without IV contrast. COMPARISON:  06/04/2015. FINDINGS: Lower chest: Motion artifact with some atelectasis in the dependent bases. Hepatobiliary: No focal abnormality in the liver on this study without intravenous contrast. No evidence of hepatomegaly. Gallbladder is surgically absent. No intrahepatic or extrahepatic biliary dilation. Pancreas: No focal mass lesion. No dilatation of the main duct. No intraparenchymal cyst. No peripancreatic edema. Spleen: No splenomegaly. No focal mass lesion. Adrenals/Urinary Tract: No adrenal nodule or mass. No mass lesion evident in either kidney on this study without intravenous contrast material. No evidence for hydroureteronephrosis. The urinary bladder appears normal for the degree of distention. Stomach/Bowel: Small hiatal hernia noted. Metallic foreign body identified along the posterior wall of the gastric fundus. This is been present on multiple prior studies is well. Duodenum is normally positioned as is the ligament of Treitz. No small bowel wall thickening. No small bowel dilatation. The terminal ileum is normal. The appendix is not visualized, but there is no edema or inflammation in the region of the cecum. Diverticuli are seen scattered along  the entire length of the colon without CT findings of diverticulitis. Small bowel adhesions to the anterior abdominal wall again noted. There is soft tissue attenuation in the midline anterior abdominal wall, at the site of previous surgical wound. This is the area where enterocutaneous fistula was visualized on the previous study. No gas in this region today but no oral contrast was administered to allow assessment of the fistula. Vascular/Lymphatic: There is abdominal aortic atherosclerosis without  aneurysm. There is no gastrohepatic or hepatoduodenal ligament lymphadenopathy. No intraperitoneal or retroperitoneal lymphadenopy. No pelvic sidewall lymphadenopathy. Reproductive: Uterus is surgically absent. There is no adnexal mass. Other: No intraperitoneal free fluid. Musculoskeletal: Bone windows reveal no worrisome lytic or sclerotic osseous lesions. IMPRESSION: Stable exam. No new or acute interval findings. There is no gas in the region of the chronic enterocutaneous fistula no evidence to suggest interval development of an abscess in this region. Fistula not well assessed given the lack of oral contrast on today's study. No evidence for bowel obstruction. No intraperitoneal fluid collection and no intraperitoneal free fluid. Electronically Signed   By: Misty Stanley M.D.   On: 07/13/2015 13:27   Dg Chest 2 View  07/13/2015  CLINICAL DATA:  Emesis.  CHF. EXAM: CHEST  2 VIEW COMPARISON:  06/04/2015 chest radiograph FINDINGS: Right PICC terminates in the lower third of the superior vena cava. Stable cardiomediastinal silhouette with mild cardiomegaly. No pneumothorax. No pleural effusion. Clear lungs, with no focal lung consolidation and no pulmonary edema. IMPRESSION: Stable mild cardiomegaly without pulmonary edema. Lungs appear clear. Electronically Signed   By: Ilona Sorrel M.D.   On: 07/13/2015 12:43    Anti-infectives: Anti-infectives    Start     Dose/Rate Route Frequency Ordered Stop   07/16/15 1000  vancomycin (VANCOCIN) 1,500 mg in sodium chloride 0.9 % 500 mL IVPB     1,500 mg 250 mL/hr over 120 Minutes Intravenous Every 48 hours 07/14/15 0956     07/14/15 1500  cefTRIAXone (ROCEPHIN) 1 g in dextrose 5 % 50 mL IVPB     1 g 100 mL/hr over 30 Minutes Intravenous Every 24 hours 07/13/15 1647     07/14/15 1015  vancomycin (VANCOCIN) 1,500 mg in sodium chloride 0.9 % 500 mL IVPB     1,500 mg 250 mL/hr over 120 Minutes Intravenous NOW 07/14/15 0943 07/14/15 1210   07/13/15 1400   cefTRIAXone (ROCEPHIN) 1 g in dextrose 5 % 50 mL IVPB     1 g 100 mL/hr over 30 Minutes Intravenous  Once 07/13/15 1359 07/13/15 1526       Assessment/Plan EC fistula -Eakin's pouch in place with about 300cc of bilious output since last night.  I&O's are very important for the management of her fistula! -PICC needs to be exchanged its still listed in the computer as the one placed on 04/27/15, then restart her TNA. Both blood cultures grew G+Cocci, but given no WBC or fever, no definite need for a holiday right now, unless ID recommends that. -cont sandostatin at 164mcg q 12h for now -cont NPO/TPN ARF - Cr. 3.21 -Likely secondary to dehyration -IVFs per primary service and TNA Multiple medical problems -Per medical service    LOS: 2 days    [name] 07/15/2015, 9:33 AM Pager: [phone]

## 2015-07-15 NOTE — Progress Notes (Signed)
[facility]                                                                              Patient Demographics  Marissa [name], is a 74 y.o. female, DOB - [birth year], JJO:[id number]  Admit date - 07/13/2015   Admitting Physician Waldemar Dickens, MD  Outpatient Primary MD for the patient is [name], MD  LOS - 2   Chief Complaint  Patient presents with  . Emesis       Brief HPI   Per Dr. Bertis Ruddy admit note on 07/13/15 Marissa [name] is a 74 y.o. female, with chronic kidney disease, obstructive sleep apnea, diastolic heart failure, type 2 diabetes, a history of multiple abdominal surgeries, and a recent discharge after care of an enterocutaneous fistula. She presents to the emergency department from [facility] with lethargy, 2 days of vomiting and severe dehydration. On 04/28/15 the patient underwent abdominal wound exploration of enterocutaneous fistula and abscess by Dr. Georgette Dover. She was admitted to the hospital on 9/22 with increased ostomy output and confusion. She was treated with TPN and Sandostatin. An eakin pouch was placed. Once she stabilized she was discharged on 10/4 to SNF. At this point, the patient is too lethargic to give history and her daughter provides information. Per the patient's daughter, Dr. Georgette Dover felt the patient could begin to take oral food & drink approximately 2 weeks ago. The patient's TPN was discontinued at some point after that. The patient ate fairly well for 4-5 days but then decreased her oral intake. Last week and the patient's enterocutaneous fistula began to drain green bile once again. Her stools became the color of green bile. Yesterday and today the patient has had multiple episodes of vomiting green bile emesis. In the emergency department the patient's bicarbonate is 12, her creatinine is elevated to 3.44 from 1.75, her BUN is 160, sodium 129. Lactic acid is not elevated at 0.69. U/A appears positive for infection.  The emergency department physician consulted general surgery. The patient will be admitted for acute renal failure, dehydration and lethargy.  Assessment & Plan    Principal Problem: Acute on chronic renal failure with metabolic acidosis, due to dehydration:History of chronic kidney disease stage III - Likely prerenal secondary to severe dehydration due to volume depletion, UTI.  - continue gentle hydration, follow creatinine  - Indomethacin, colchicine DC'd avoid any nephrotoxic medications  Active problems Chronic Enterocutaneous fistula s/p Eakin pouch, difficult seal - Appreciate general surgery following  - Per general surgery, continue NPO status, IV fluids, Sandostatin  - Eakin pouch placed to intermittent wall suction  - Wound care to also follow patient  GPC bacteremia sepsis 2/2  - Continue IV vancomycin, follow sensitivities, obtain 2-D echocardiogram  - Pending sensitivities, will need ID consult   Gram-negative rods UTI  Follow urine culture and sensitivities, placed on IV Rocephin  Oral thrush Nystatin swish and swallow 4 times a day  Hyponatremia Secondary to poor intake, continue gentle hydration, follow BMET  Chronic Diastolic heart failure LVEF of 55% currently hypovolemic, on gentle hydration, avoid fluid overload   Obstructive sleep apnea CPAP daily at bedtime  Diabetes mellitus type 2 -Continue sliding scale insulin - Hemoglobin A1c 6.2  Nutrition, failure to thrive Continue TPN  Acute encephalopathy Likely due to #1, sepsis, UTI  Code Status: DO NOT RESUSCITATE  Family Communication: Discussed in detail with the patient, all imaging results, lab results explained to the patient    Disposition Plan:   Time Spent in minutes   25 minutes  Procedures  None  Consults   General surgery Wound care  DVT Prophylaxis heparin subcutaneous   Medications  Scheduled Meds: . cefTRIAXone (ROCEPHIN)  IV  1 g Intravenous Q24H  . heparin  5,000  Units Subcutaneous 3 times per day  . insulin aspart  0-9 Units Subcutaneous 6 times per day  . nystatin  5 mL Oral QID  . octreotide  100 mcg Subcutaneous Q12H  . pantoprazole (PROTONIX) IV  40 mg Intravenous Q24H  . [START ON 07/16/2015] vancomycin  1,000 mg Intravenous Q48H   Continuous Infusions: . Marland KitchenTPN (CLINIMIX-E) Adult 83 mL/hr at 07/14/15 1750   And  . fat emulsion 240 mL (07/14/15 1750)   PRN Meds:.alum & mag hydroxide-simeth, diclofenac sodium, diphenhydrAMINE, Glycerin (Adult), morphine injection, nitroGLYCERIN, ondansetron **OR** ondansetron (ZOFRAN) IV, oxyCODONE, sodium chloride   Antibiotics   Anti-infectives    Start     Dose/Rate Route Frequency Ordered Stop   07/16/15 1000  vancomycin (VANCOCIN) 1,500 mg in sodium chloride 0.9 % 500 mL IVPB  Status:  Discontinued     1,500 mg 250 mL/hr over 120 Minutes Intravenous Every 48 hours 07/14/15 0956 07/15/15 1035   07/16/15 1000  vancomycin (VANCOCIN) IVPB 1000 mg/200 mL premix     1,000 mg 200 mL/hr over 60 Minutes Intravenous Every 48 hours 07/15/15 1035     07/14/15 1500  cefTRIAXone (ROCEPHIN) 1 g in dextrose 5 % 50 mL IVPB     1 g 100 mL/hr over 30 Minutes Intravenous Every 24 hours 07/13/15 1647     07/14/15 1015  vancomycin (VANCOCIN) 1,500 mg in sodium chloride 0.9 % 500 mL IVPB     1,500 mg 250 mL/hr over 120 Minutes Intravenous NOW 07/14/15 0943 07/14/15 1210   07/13/15 1400  cefTRIAXone (ROCEPHIN) 1 g in dextrose 5 % 50 mL IVPB     1 g 100 mL/hr over 30 Minutes Intravenous  Once 07/13/15 1359 07/13/15 1526        Subjective:   Marissa [name] was seen and examined today. Eakin pouch, to intermittent wall suction. Somewhat lethargic but opens eyes to name and responds in one word answers.. Patient denies dizziness, chest pain, shortness of breath,  new weakness, numbess, tingling. No acute events overnight.    Objective:   Blood pressure 116/71, pulse 95, temperature 98.6 F (37 C), temperature source  Oral, resp. rate 18, height 5\' 4"  (1.626 m), weight 94.6 kg (208 lb 8.9 oz), SpO2 100 %.  Wt Readings from Last 3 Encounters:  07/14/15 94.6 kg (208 lb 8.9 oz)  06/15/15 102.7 kg (226 lb 6.6 oz)  05/01/15 99.3 kg (218 lb 14.7 oz)     Intake/Output Summary (Last 24 hours) at 07/15/15 1214 Last data filed at 07/15/15 0900  Gross per 24 hour  Intake  387.5 ml  Output    225 ml  Net  162.5 ml    Exam  General: Drowsy/lethargic but arousable  HEENT:  PERRLA, EOMI, Anicteric Sclera, mucous membranes moist.   Neck: Supple, no JVD, no masses  CVS: S1 S2 clear, RRR  Respiratory: Clear to  auscultation bilaterally, no wheezing, rales or rhonchi, anteriorly auscultated  Abdomen: EC fistula, with bilious drainage, tender, pouch to intermittent wall suction   Ext: no cyanosis clubbing or edema  Neuro: moving all 4 extremities  Psych: lethargic    Data Review   Micro Results Recent Results (from the past 240 hour(s))  Culture, Urine     Status: None (Preliminary result)   Collection Time: 07/13/15 12:14 PM  Result Value Ref Range Status   Specimen Description URINE, RANDOM  Final   Special Requests ADDED 2258  Final   Culture >=100,000 COLONIES/mL GRAM NEGATIVE RODS  Final   Report Status PENDING  Incomplete  Blood culture (routine x 2)     Status: None (Preliminary result)   Collection Time: 07/13/15  2:50 PM  Result Value Ref Range Status   Specimen Description BLOOD PICC LINE  Final   Special Requests BOTTLES DRAWN AEROBIC AND ANAEROBIC 5ML  Final   Culture  Setup Time   Final    GRAM POSITIVE COCCI IN CLUSTERS IN BOTH AEROBIC AND ANAEROBIC BOTTLES CRITICAL RESULT CALLED TO, READ BACK BY AND VERIFIED WITH: TO BQIU(RN) BY TCLEVELAND 07/14/2015 AT 5:58 AM    Culture NO GROWTH < 24 HOURS  Final   Report Status PENDING  Incomplete  Blood culture (routine x 2)     Status: None (Preliminary result)   Collection Time: 07/13/15  2:55 PM  Result Value Ref Range Status    Specimen Description BLOOD LEFT HAND  Final   Special Requests   Final    BOTTLES DRAWN AEROBIC AND ANAEROBIC BLUE 5CC, RED 3CC   Culture  Setup Time   Final    GRAM POSITIVE COCCI IN CLUSTERS AEROBIC BOTTLE ONLY CRITICAL RESULT CALLED TO, READ BACK BY AND VERIFIED WITH: J NARAMDAS,RN AT 1122 07/14/15 BY L BENFIELD    Culture GRAM POSITIVE COCCI  Final   Report Status PENDING  Incomplete    Radiology Reports Ct Abdomen Pelvis Wo Contrast  07/13/2015  CLINICAL DATA:  Subsequent encounter for nausea vomiting with low abdominal pain and increased drainage from fistula. EXAM: CT ABDOMEN AND PELVIS WITHOUT CONTRAST TECHNIQUE: Multidetector CT imaging of the abdomen and pelvis was performed following the standard protocol without IV contrast. COMPARISON:  06/04/2015. FINDINGS: Lower chest: Motion artifact with some atelectasis in the dependent bases. Hepatobiliary: No focal abnormality in the liver on this study without intravenous contrast. No evidence of hepatomegaly. Gallbladder is surgically absent. No intrahepatic or extrahepatic biliary dilation. Pancreas: No focal mass lesion. No dilatation of the main duct. No intraparenchymal cyst. No peripancreatic edema. Spleen: No splenomegaly. No focal mass lesion. Adrenals/Urinary Tract: No adrenal nodule or mass. No mass lesion evident in either kidney on this study without intravenous contrast material. No evidence for hydroureteronephrosis. The urinary bladder appears normal for the degree of distention. Stomach/Bowel: Small hiatal hernia noted. Metallic foreign body identified along the posterior wall of the gastric fundus. This is been present on multiple prior studies is well. Duodenum is normally positioned as is the ligament of Treitz. No small bowel wall thickening. No small bowel dilatation. The terminal ileum is normal. The appendix is not visualized, but there is no edema or inflammation in the region of the cecum. Diverticuli are seen scattered  along the entire length of the colon without CT findings of diverticulitis. Small bowel adhesions to the anterior abdominal wall again noted. There is soft tissue attenuation in the midline anterior abdominal wall, at the site of previous surgical  wound. This is the area where enterocutaneous fistula was visualized on the previous study. No gas in this region today but no oral contrast was administered to allow assessment of the fistula. Vascular/Lymphatic: There is abdominal aortic atherosclerosis without aneurysm. There is no gastrohepatic or hepatoduodenal ligament lymphadenopathy. No intraperitoneal or retroperitoneal lymphadenopy. No pelvic sidewall lymphadenopathy. Reproductive: Uterus is surgically absent. There is no adnexal mass. Other: No intraperitoneal free fluid. Musculoskeletal: Bone windows reveal no worrisome lytic or sclerotic osseous lesions. IMPRESSION: Stable exam. No new or acute interval findings. There is no gas in the region of the chronic enterocutaneous fistula no evidence to suggest interval development of an abscess in this region. Fistula not well assessed given the lack of oral contrast on today's study. No evidence for bowel obstruction. No intraperitoneal fluid collection and no intraperitoneal free fluid. Electronically Signed   By: Misty Stanley M.D.   On: 07/13/2015 13:27   Dg Chest 2 View  07/13/2015  CLINICAL DATA:  Emesis.  CHF. EXAM: CHEST  2 VIEW COMPARISON:  06/04/2015 chest radiograph FINDINGS: Right PICC terminates in the lower third of the superior vena cava. Stable cardiomediastinal silhouette with mild cardiomegaly. No pneumothorax. No pleural effusion. Clear lungs, with no focal lung consolidation and no pulmonary edema. IMPRESSION: Stable mild cardiomegaly without pulmonary edema. Lungs appear clear. Electronically Signed   By: Ilona Sorrel M.D.   On: 07/13/2015 12:43    CBC  Recent Labs Lab 07/13/15 1335 07/14/15 0420  WBC 8.1 8.5  HGB 10.7* 10.8*  HCT  32.1* 32.3*  PLT 187 190  MCV 73.1* 73.1*  MCH 24.4* 24.4*  MCHC 33.3 33.4  RDW 14.7 15.0  LYMPHSABS 0.9 1.3  MONOABS 0.7 0.8  EOSABS 0.2 0.2  BASOSABS 0.0 0.0    Chemistries   Recent Labs Lab 07/13/15 1335 07/14/15 0420 07/15/15 0420  NA 129* 128* 130*  K 4.9 4.1 4.0  CL 106 105 104  CO2 12* 11* 13*  GLUCOSE 155* 160* 174*  BUN 160* 154* 140*  CREATININE 3.44* 3.50* 3.21*  CALCIUM 9.7 9.7 9.7  MG  --  2.0 2.3  AST 42* 46*  --   ALT 52 57*  --   ALKPHOS 176* 191*  --   BILITOT 0.9 0.8  --    ------------------------------------------------------------------------------------------------------------------ estimated creatinine clearance is 17.2 mL/min (by C-G formula based on Cr of 3.21). ------------------------------------------------------------------------------------------------------------------  Recent Labs  07/13/15 1335  HGBA1C 6.2*   ------------------------------------------------------------------------------------------------------------------  Recent Labs  07/14/15 0420  TRIG 85   ------------------------------------------------------------------------------------------------------------------ No results for input(s): TSH, T4TOTAL, T3FREE, THYROIDAB in the last 72 hours.  Invalid input(s): FREET3 ------------------------------------------------------------------------------------------------------------------ No results for input(s): VITAMINB12, FOLATE, FERRITIN, TIBC, IRON, RETICCTPCT in the last 72 hours.  Coagulation profile No results for input(s): INR, PROTIME in the last 168 hours.  No results for input(s): DDIMER in the last 72 hours.  Cardiac Enzymes  Recent Labs Lab 07/13/15 1335  TROPONINI <0.03   ------------------------------------------------------------------------------------------------------------------ Invalid input(s): POCBNP   Recent Labs  07/14/15 1605 07/14/15 2016 07/15/15 0006 07/15/15 0407 07/15/15 0801  07/15/15 1140  GLUCAP 155* 131* 135* 152* 169* 180*     RAI,RIPUDEEP M.D. [facility] 07/15/2015, 12:14 PM  Pager: [phone] Between 7am to 7pm - call Pager - [phone]  After 7pm go to [email or web address] - password [email or web address]  Call night coverage person covering after 7pm

## 2015-07-16 ENCOUNTER — Inpatient Hospital Stay (HOSPITAL_COMMUNITY): Payer: Medicare Other

## 2015-07-16 DIAGNOSIS — R509 Fever, unspecified: Secondary | ICD-10-CM

## 2015-07-16 DIAGNOSIS — R0789 Other chest pain: Secondary | ICD-10-CM

## 2015-07-16 DIAGNOSIS — G9340 Encephalopathy, unspecified: Secondary | ICD-10-CM

## 2015-07-16 LAB — COMPREHENSIVE METABOLIC PANEL
ALT: 58 U/L — ABNORMAL HIGH (ref 14–54)
ANION GAP: 11 (ref 5–15)
AST: 42 U/L — ABNORMAL HIGH (ref 15–41)
Albumin: 2.7 g/dL — ABNORMAL LOW (ref 3.5–5.0)
Alkaline Phosphatase: 172 U/L — ABNORMAL HIGH (ref 38–126)
BILIRUBIN TOTAL: 0.3 mg/dL (ref 0.3–1.2)
BUN: 149 mg/dL — ABNORMAL HIGH (ref 6–20)
CHLORIDE: 108 mmol/L (ref 101–111)
CO2: 11 mmol/L — ABNORMAL LOW (ref 22–32)
Calcium: 9.4 mg/dL (ref 8.9–10.3)
Creatinine, Ser: 3.32 mg/dL — ABNORMAL HIGH (ref 0.44–1.00)
GFR calc non Af Amer: 13 mL/min — ABNORMAL LOW (ref >60)
GFR, EST AFRICAN AMERICAN: 15 mL/min — AB (ref >60)
Glucose, Bld: 167 mg/dL — ABNORMAL HIGH (ref 65–99)
POTASSIUM: 4 mmol/L (ref 3.5–5.1)
Sodium: 130 mmol/L — ABNORMAL LOW (ref 135–145)
TOTAL PROTEIN: 8.6 g/dL — AB (ref 6.5–8.1)

## 2015-07-16 LAB — GLUCOSE, CAPILLARY
GLUCOSE-CAPILLARY: 193 mg/dL — AB (ref 65–99)
GLUCOSE-CAPILLARY: 196 mg/dL — AB (ref 65–99)
Glucose-Capillary: 157 mg/dL — ABNORMAL HIGH (ref 65–99)
Glucose-Capillary: 157 mg/dL — ABNORMAL HIGH (ref 65–99)
Glucose-Capillary: 180 mg/dL — ABNORMAL HIGH (ref 65–99)
Glucose-Capillary: 219 mg/dL — ABNORMAL HIGH (ref 65–99)

## 2015-07-16 LAB — CULTURE, BLOOD (ROUTINE X 2)

## 2015-07-16 LAB — PHOSPHORUS: PHOSPHORUS: 4.4 mg/dL (ref 2.5–4.6)

## 2015-07-16 LAB — URINE CULTURE

## 2015-07-16 LAB — MAGNESIUM: MAGNESIUM: 2.1 mg/dL (ref 1.7–2.4)

## 2015-07-16 MED ORDER — INSULIN ASPART 100 UNIT/ML ~~LOC~~ SOLN
0.0000 [IU] | SUBCUTANEOUS | Status: DC
  Administered 2015-07-16 (×3): 2 [IU] via SUBCUTANEOUS
  Administered 2015-07-17: 3 [IU] via SUBCUTANEOUS
  Administered 2015-07-18 – 2015-07-20 (×9): 1 [IU] via SUBCUTANEOUS
  Administered 2015-07-20: 2 [IU] via SUBCUTANEOUS
  Administered 2015-07-21 – 2015-07-23 (×7): 1 [IU] via SUBCUTANEOUS
  Administered 2015-07-24 (×2): 2 [IU] via SUBCUTANEOUS
  Administered 2015-07-24 – 2015-07-25 (×3): 1 [IU] via SUBCUTANEOUS

## 2015-07-16 MED ORDER — TRACE MINERALS CR-CU-MN-SE-ZN 10-1000-500-60 MCG/ML IV SOLN
INTRAVENOUS | Status: AC
  Administered 2015-07-16: 18:00:00 via INTRAVENOUS
  Filled 2015-07-16: qty 1988

## 2015-07-16 MED ORDER — FAT EMULSION 20 % IV EMUL
234.0000 mL | INTRAVENOUS | Status: AC
  Administered 2015-07-16: 234 mL via INTRAVENOUS
  Filled 2015-07-16: qty 250

## 2015-07-16 MED ORDER — PERFLUTREN LIPID MICROSPHERE
1.0000 mL | INTRAVENOUS | Status: AC | PRN
  Administered 2015-07-16: 2 mL via INTRAVENOUS
  Filled 2015-07-16: qty 10

## 2015-07-16 NOTE — Progress Notes (Signed)
Pt refusing CPAP at this time. Advised pt we have a CPAP available for her if she changes her mind. RT will continue to monitor.

## 2015-07-16 NOTE — Progress Notes (Signed)
Physical Therapy Treatment Patient Details Name: Marissa [name] MRN: [id number] DOB: 03-03-1941 Today's Date: 07/16/2015    History of Present Illness 74 y.o. female presenting with emesis. Pt was admitted on 9/22 with AMS, abdominal wounds and pain, dehydration and weakness. PMH: Chronic kidney disease, obstructive sleep apnea, diastolic heart failure, DMII, HTN and Hidradenitis suppurativa.    PT Comments    Pt making very slow progress towards goals and was very lethargic during session.  She was able to improve ability to get to EOB, however was unable to stand and was unable to assist with getting back into bed despite max cuing from therapist for safety.  Continue to recommend SNF for follow up at D/C.   Follow Up Recommendations  SNF;Supervision/Assistance - 24 hour     Equipment Recommendations  None recommended by PT    Recommendations for Other Services       Precautions / Restrictions Precautions Precautions: Fall Precaution Comments: wound pouch in place mid-stomach region Restrictions Weight Bearing Restrictions: No    Mobility  Bed Mobility Overal bed mobility: Needs Assistance Bed Mobility: Supine to Sit;Sit to Supine     Supine to sit: HOB elevated;Mod assist (w/ rail) Sit to supine: Total assist   General bed mobility comments: Pt did much better today with getting to EOB and actually states "let me try on my own."  Cues for use of rail and scooting hips to EOB.  She did require assist to elevate trunk into sitting position.  Pt with marked fatigue following attempt to stand, therefore requires total A to return to supine.   Transfers Overall transfer level: Needs assistance Equipment used:  (therapist standing in front of pt) Transfers: Sit to/from Stand Sit to Stand: Max assist         General transfer comment: Pt unable to fully stand today, she was able to elevate buttocks with max A, however was impulsive to sit back down.    Ambulation/Gait              General Gait Details: unable to attempt at this time, abd wound hooked up to suction on wall   Stairs            Wheelchair Mobility    Modified Rankin (Stroke Patients Only)       Balance   Sitting-balance support: Feet supported;Bilateral upper extremity supported Sitting balance-Leahy Scale: Poor     Standing balance support: During functional activity Standing balance-Leahy Scale: Zero                      Cognition Arousal/Alertness: Lethargic Behavior During Therapy: WFL for tasks assessed/performed;Flat affect Overall Cognitive Status: Within Functional Limits for tasks assessed                      Exercises      General Comments        Pertinent Vitals/Pain Pain Assessment: Faces Faces Pain Scale: Hurts even more Pain Location: stomach Pain Descriptors / Indicators: Burning Pain Intervention(s): Monitored during session;Repositioned    Home Living                      Prior Function            PT Goals (current goals can now be found in the care plan section) Acute Rehab PT Goals Patient Stated Goal: to feel better PT Goal Formulation: With patient Time For Goal Achievement: 07/28/15 Potential to Achieve  Goals: Fair Progress towards PT goals: Progressing toward goals    Frequency  Min 2X/week    PT Plan      Co-evaluation             End of Session   Activity Tolerance: Patient limited by fatigue Patient left: in bed;with call bell/phone within reach;with bed alarm set     Time: 1840-3754 PT Time Calculation (min) (ACUTE ONLY): 29 min  Charges:  $Therapeutic Activity: 23-37 mins                    G Codes:      Denice Bors 07/16/2015, 3:18 PM

## 2015-07-16 NOTE — Care Management Important Message (Signed)
Important Message  Patient Details  Name: Marissa [name] MRN: [id number] Date of Birth: [birth year]   Medicare Important Message Given:  Yes-second notification given    Nathen May 07/16/2015, 3:51 PM

## 2015-07-16 NOTE — Progress Notes (Signed)
Nutrition Follow-up  DOCUMENTATION CODES:   Obesity unspecified  INTERVENTION:  Cyclic TPN per Pharmacy.  RD to continue to monitor.   NUTRITION DIAGNOSIS:   Inadequate oral intake related to inability to eat as evidenced by NPO status; ongoing  GOAL:   Patient will meet greater than or equal to 90% of their needs; met  MONITOR:   Diet advancement, Weight trends, Labs, I & O's, Skin  REASON FOR ASSESSMENT:   Consult New TPN/TNA  ASSESSMENT:   74 y.o. 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  She presents to the emergency department from [facility] with lethargy, 2 days of vomiting and severe dehydration. Patient began to take oral food & drink approximately 2 weeks ago. The patient's TPN was discontinued at some point after that. The patient ate fairly well for 4-5 days but then decreased her oral intake. Last week the patient's enterocutaneous fistula began to drain green bile once again. Her stools became the color of green bile. Pt with eakin's pouch to suction.   Pt is currently receiving Clinimix E 5/15 at 72mL/hr with IVFE 20% at 59mL/hr which provides 1894 kcal (100% of needs) and 100 grams of protein and has been tolerating it. Pharmacy plans to start cyclic TPN tonight with Clinimix NO LYTES 5/15 at 20mL/hr x1 hr, then increase to 170mL/hr x16 hr, then 34mL/hr x1 hr for a total of 1965mL over an 18 hr cycle. IVFE 20% to run at 63mL/hr over 18 hr which will provide 100 of needs (99g protein and 1880kcal).  Potassium, phosphorous, and magnesium WNL.   Diet Order:  Diet NPO time specified Except for: Sips with Meds TPN (CLINIMIX) Adult without lytes TPN (CLINIMIX) Adult without lytes  Skin:   (Eakin's pouch to suction on abdomen)  Last BM:  11/2  Height:   Ht Readings from Last 1 Encounters:   07/15/15 $RemoveB'5\' 4"'[id number]$  (1.626 m)    Weight:   Wt Readings from Last 1 Encounters:  07/15/15 208 lb 5.4 oz (94.5 kg)    Ideal Body Weight:  54.5 kg  BMI:  Body mass index is 35.74 kg/(m^2).  Estimated Nutritional Needs:   Kcal:  1700-1900  Protein:  100-110 grams  Fluid:  1.7-1.9 L/day  EDUCATION NEEDS:   No education needs identified at this time  Corrin Parker, MS, RD, LDN Pager # [phone] After hours/ weekend pager # [phone]

## 2015-07-16 NOTE — Consult Note (Signed)
WOC wound follow up Current pouch is leaking behind the barrier. Wound type: Full thickness wound to midline abd with fistula Wound bed: Dark red, small amt bleeding to wound edges Drainage (amount, consistency, odor) Large amt green drainage in cannister Periwound: Red macerated weeping skin with partial thickness breakdown from moisture associated skin damage related to previous pouch leakage, is slowly improving. Dressing procedure/placement/frequency: Applied ostomy powder, then skin prep to protect skin, then large Eakin pouch with red rubber catheter to low wall suction to control drainage and promote healing to macerated skin. Dressing change much less painful. [place] team will continue to follow. Supplies at bedside and instructions provided for staff nurses if leakage occurs. Julien Girt MSN, RN, [place], [place], [place]

## 2015-07-16 NOTE — Progress Notes (Signed)
PARENTERAL NUTRITION CONSULT NOTE  Pharmacy Consult for TPN Indication: enterocutaneous fistula  No Known Allergies  Patient Measurements: Height: 5\' 4"  (162.6 cm) Weight: 208 lb 5.4 oz (94.5 kg) IBW/kg (Calculated) : 54.7   Vital Signs: Temp: 98.6 F (37 C) (11/03 0422) Temp Source: Oral (11/03 0422) BP: 118/60 mmHg (11/03 0422) Pulse Rate: 98 (11/03 0422) Intake/Output from previous day: 11/02 0701 - 11/03 0700 In: 2332 [IV Piggyback:100; WIO:0355] Out: 1000 [Urine:1000] Intake/Output from this shift:    Labs:  Recent Labs  07/13/15 1335 07/14/15 0420  WBC 8.1 8.5  HGB 10.7* 10.8*  HCT 32.1* 32.3*  PLT 187 190     Recent Labs  07/13/15 1335 07/14/15 0420 07/15/15 0420 07/16/15 0430  NA 129* 128* 130* 130*  K 4.9 4.1 4.0 4.0  CL 106 105 104 108  CO2 12* 11* 13* 11*  GLUCOSE 155* 160* 174* 167*  BUN 160* 154* 140* 149*  CREATININE 3.44* 3.50* 3.21* 3.32*  CALCIUM 9.7 9.7 9.7 9.4  MG  --  2.0 2.3 2.1  PHOS  --  5.0* 5.6* 4.4  PROT 8.8* 8.1  --  8.6*  ALBUMIN 3.0* 3.0*  --  2.7*  AST 42* 46*  --  42*  ALT 52 57*  --  58*  ALKPHOS 176* 191*  --  172*  BILITOT 0.9 0.8  --  0.3  PREALBUMIN  --  30.3  --   --   TRIG  --  85  --   --    Estimated Creatinine Clearance: 16.6 mL/min (by C-G formula based on Cr of 3.32).    Recent Labs  07/16/15 0036 07/16/15 0415 07/16/15 0737  GLUCAP 157* 157* 193*    Insulin Requirements in the past 24 hours:  12 units SSI  Nutritional Goals: per RD note 11/1 1700-1900 kCal, 100-110 grams of protein per day  Current Nutrition:  Clinimix E 5/15 at 84mL/hr + IVFE 20% at 61mL/hr-provides 99g protein and 1894kcal which meets ~100% of previous goals  Per discussions/notes from [facility], was receiving cycled Clinimix 5/15 alternated with E 5/15, 2070ml over 12 hours (2000-0800). Per RN from facility [facility], patient was receiving TPN during her entire stay, however patient's admission labs/dehydrated  state as well as report from daughter make this questionable.  Assessment: 74 YOF on chronic TPN for a fistula who was admitted from [facility] with lethargy, 2 days of vomiting, increased drainage from fistula and severe dehydration. Her EC output was bilious and Eakin's pouch was removed at facility. CT scan negative for infection.  Surgeries/Procedures: 04/28/15: wound exploration, explanation of abdominal mesh Multiple other abdominal surgeries  GI: Eakin pouch replaced- with suction. Wound care following. Sandostatin resumed. On IV PPI as well Prealbumin 30.3 which is normal. With weight loss over the past month.   Endo: A1C this admission is 6.2 (up from 5.7 in July 2016) CBGs 131-181- On SSI.  Lytes: Na low at 130. K nml at 4, CorCa good at ~10.2, mag nml at 2.1. Phos normalized to 4.4- no refeeding noted (Ca x phos product = 45)  Renal: sCr 3.32 with BUN 149- minimal improvement.   Hepatobil: AST, ALT and alk phos all mildly elevated. Tbili nml. Albumin low at 2.7. Trigs normal at 85  Pulm: 100/RA. CPAP at night- she refused last night  Cards: EF 55%; VSS- no meds  Neuro: lethargic   ID: Nystatin for thrush. Started on vancomycin for r/o bacteremia. Also with possible UTI and  started on Rocephin. WBC 8.5, afebrile. OK to treat without line holiday or stoppage of TPN per ID  10/31 BCx: 2/2 CoNS 10/31 urine: proteus (pan sens except to nitrofurantoin)  Rocephin 10/31>> vanc 11/1>>  Best Practices: subq hep  TPN Access: PICC placed in Sept-  Some discussion around switching out PICC, but not yet done TPN start date: chronic TPN- was on at home prior to admission in Sept, and continued after discharge. resumed 11/1 at the hospital  Plan: -since patient is tolerating restart of TPN, will begin to cycle tonight. Clinimix NO LYTES 5/15- to run at 71m/hr x1 hr, then increase to 1114mhr x16 hr, then 5019mr x1 hr for a total of 1988m14mer an 18 hr cycle. IVFE 20%  to run at 13mL90mover 18 hr (will still provide 99g protein and 1880kcal) -IV multivitamin and trace elements in TPN -change CBGs and SSI to 0800, 1300, 1600, and 2000 -bmet, mag and phos in the morning  Ruweyda Macknight D. Mackinsey Pelland, PharmD, BCPS Clinical Pharmacist Pager: [phone]/2016 9:24 AM

## 2015-07-16 NOTE — Progress Notes (Signed)
[facility] PROGRESS NOTE  Lexy Meininger LZJ:[id number] DOB: 01-04-1941 DOA: 07/13/2015 PCP: [name], MD   Brief narrative 74 y.o. female, with chronic kidney disease, obstructive sleep apnea, diastolic heart failure, type 2 diabetes,   multiple abdominal surgeries, and a recent discharge after care of an enterocutaneous fistula, presented to the emergency department from [facility] with lethargy, 2 days of vomiting and severe dehydration. On 04/28/15 the patient underwent abdominal wound exploration of enterocutaneous fistula and abscess by Dr. Georgette Dover. She was admitted to the hospital on 9/22 with increased ostomy output and confusion. She was treated with TPN and Sandostatin. An eakin pouch was placed. Once she stabilized she was discharged on 10/4 to SNF. Marland Kitchen Per the patient's daughter, Dr. Georgette Dover felt the patient could begin to take oral food & drink approximately 2 weeks ago. The patient's TPN was discontinued at some point after that. The patient ate fairly well for 4-5 days but then decreased her oral intake. One week back patient's enterocutaneous fistula began to drain green bile once again. Her stools became the color of green bile. Yesterday prior to admission patient had several episodes of biliary vomiting. In the emergency department the patient's bicarbonate was 12, her creatinine is elevated to 3.44 from 1.75, her BUN is 160, sodium 129. Lactic acid was normal. UA positive for UTI. Patient admitted to hospitalist service and general surgery following.   Assessment/Plan: Acute on chronic kidney injury with metabolic acidosis Possible secondary to dehydration. Has underlying chronic kidney disease stage III. Continue gentle hydration. He had been still elevated. Discontinued indomethacin, colchicine. Avoid all nephrotoxic agents.  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 Maintain  nothing by mouth and started TNA. Eakin pounds placed to intermittent wall suction. Wound care following. Continue Sandostatin  Coag negative staph bacteremia with sepsis 2/2 Seen by ID and recommend one week of vancomycin. recommend ok to leave PICC unless repeat cx positive. CCS plan on exchanging PICC as outpt.  Proteus UTI Pansensitive. On empiric Rocephin.   Acute encephalopathy Possible secondary to sepsis and dehydration. Unclear if she has shown any improvement  Chest pain on 11/3 Unable to provide detailed history. Check EKG   Hyponatremia Secondary to dehydration. Monitor with gentle hydration.  Oral thrush Continue nystatin  Chronic diastolic CHF EF of 02%. Currently hypovolemic and on gentle hydration.  Obstructive sleep apnea Continue bedtime CPAP  Type 2 diabetes mellitus Continue sliding scale insulin  Severe malnutrition with failure to thrive Continue T NA    Code Status: DO NOT RESUSCITATE Family Communication: None at bedside Disposition Plan: The patient   Consultants:  CCS  Procedures:  CT abdomen and pelvis  Antibiotics: IV vancomycin 11/1-- and Rocephin 10/31--  HPI/Subjective: Seen and examined. Complains of left-sided chest discomfort.  Objective: Filed Vitals:   07/16/15 1000  BP: 106/60  Pulse:   Temp: 98.2 F (36.8 C)  Resp: 18    Intake/Output Summary (Last 24 hours) at 07/16/15 1600 Last data filed at 07/16/15 1403  Gross per 24 hour  Intake 1699.65 ml  Output   1301 ml  Net 398.65 ml   Filed Weights   07/14/15 1826 07/14/15 2019 07/15/15 2018  Weight: 94.303 kg (207 lb 14.4 oz) 94.6 kg (208 lb 8.9 oz) 94.5 kg (208 lb 5.4 oz)    Exam:   General: Female lying in bed not in distress, poorly communicative  HEENT: Pallor, moist oral mucosa, supplemented  Chest: Clear to  auscultation bilaterally  CVS: Normal S1 and S2, no murmurs  GI: Soft, nondistended, nontender, midline wound with fistula, ( eakin  pouch)  Musculoskeletal: Warm, no edema  RKY:HCWCB and oriented. Poorly communicative  Data Reviewed: Basic Metabolic Panel:  Recent Labs Lab 07/13/15 1335 07/14/15 0420 07/15/15 0420 07/16/15 0430  NA 129* 128* 130* 130*  K 4.9 4.1 4.0 4.0  CL 106 105 104 108  CO2 12* 11* 13* 11*  GLUCOSE 155* 160* 174* 167*  BUN 160* 154* 140* 149*  CREATININE 3.44* 3.50* 3.21* 3.32*  CALCIUM 9.7 9.7 9.7 9.4  MG  --  2.0 2.3 2.1  PHOS  --  5.0* 5.6* 4.4   Liver Function Tests:  Recent Labs Lab 07/13/15 1335 07/14/15 0420 07/16/15 0430  AST 42* 46* 42*  ALT 52 57* 58*  ALKPHOS 176* 191* 172*  BILITOT 0.9 0.8 0.3  PROT 8.8* 8.1 8.6*  ALBUMIN 3.0* 3.0* 2.7*   No results for input(s): LIPASE, AMYLASE in the last 168 hours. No results for input(s): AMMONIA in the last 168 hours. CBC:  Recent Labs Lab 07/13/15 1335 07/14/15 0420  WBC 8.1 8.5  NEUTROABS 6.3 6.2  HGB 10.7* 10.8*  HCT 32.1* 32.3*  MCV 73.1* 73.1*  PLT 187 190   Cardiac Enzymes:  Recent Labs Lab 07/13/15 1335  TROPONINI <0.03   BNP (last 3 results)  Recent Labs  11/02/14 2340 03/19/15 1325  BNP 109.2* 141.6*    ProBNP (last 3 results) No results for input(s): PROBNP in the last 8760 hours.  CBG:  Recent Labs Lab 07/15/15 2016 07/16/15 0036 07/16/15 0415 07/16/15 0737 07/16/15 1245  GLUCAP 140* 157* 157* 193* 196*    Recent Results (from the past 240 hour(s))  Culture, Urine     Status: None   Collection Time: 07/13/15 12:14 PM  Result Value Ref Range Status   Specimen Description URINE, RANDOM  Final   Special Requests ADDED 2258  Final   Culture >=100,000 COLONIES/mL PROTEUS MIRABILIS  Final   Report Status 07/16/2015 FINAL  Final   Organism ID, Bacteria PROTEUS MIRABILIS  Final      Susceptibility   Proteus mirabilis - MIC*    AMPICILLIN <=2 SENSITIVE Sensitive     CEFAZOLIN <=4 SENSITIVE Sensitive     CEFTRIAXONE <=1 SENSITIVE Sensitive     CIPROFLOXACIN <=0.25 SENSITIVE  Sensitive     GENTAMICIN <=1 SENSITIVE Sensitive     IMIPENEM 4 SENSITIVE Sensitive     NITROFURANTOIN 128 RESISTANT Resistant     TRIMETH/SULFA <=20 SENSITIVE Sensitive     AMPICILLIN/SULBACTAM <=2 SENSITIVE Sensitive     PIP/TAZO <=4 SENSITIVE Sensitive     * >=100,000 COLONIES/mL PROTEUS MIRABILIS  Blood culture (routine x 2)     Status: None   Collection Time: 07/13/15  2:50 PM  Result Value Ref Range Status   Specimen Description BLOOD PICC LINE  Final   Special Requests BOTTLES DRAWN AEROBIC AND ANAEROBIC 5ML  Final   Culture  Setup Time   Final    GRAM POSITIVE COCCI IN CLUSTERS IN BOTH AEROBIC AND ANAEROBIC BOTTLES CRITICAL RESULT CALLED TO, READ BACK BY AND VERIFIED WITH: TO BQIU(RN) BY TCLEVELAND 07/14/2015 AT 5:58 AM    Culture STAPHYLOCOCCUS SPECIES (COAGULASE NEGATIVE)  Final   Report Status 07/16/2015 FINAL  Final   Organism ID, Bacteria STAPHYLOCOCCUS SPECIES (COAGULASE NEGATIVE)  Final      Susceptibility   Staphylococcus species (coagulase negative) - MIC*    CIPROFLOXACIN <=0.5 SENSITIVE  Sensitive     ERYTHROMYCIN <=0.25 SENSITIVE Sensitive     GENTAMICIN <=0.5 SENSITIVE Sensitive     OXACILLIN >=4 RESISTANT Resistant     TETRACYCLINE 2 SENSITIVE Sensitive     VANCOMYCIN 2 SENSITIVE Sensitive     TRIMETH/SULFA <=10 SENSITIVE Sensitive     CLINDAMYCIN >=8 RESISTANT Resistant     RIFAMPIN <=0.5 SENSITIVE Sensitive     Inducible Clindamycin NEGATIVE Sensitive     * STAPHYLOCOCCUS SPECIES (COAGULASE NEGATIVE)  Blood culture (routine x 2)     Status: None   Collection Time: 07/13/15  2:55 PM  Result Value Ref Range Status   Specimen Description BLOOD LEFT HAND  Final   Special Requests   Final    BOTTLES DRAWN AEROBIC AND ANAEROBIC BLUE 5CC, RED 3CC   Culture  Setup Time   Final    GRAM POSITIVE COCCI IN CLUSTERS AEROBIC BOTTLE ONLY CRITICAL RESULT CALLED TO, READ BACK BY AND VERIFIED WITH: J NARAMDAS,RN AT 1122 07/14/15 BY L BENFIELD    Culture   Final     STAPHYLOCOCCUS SPECIES (COAGULASE NEGATIVE) SUSCEPTIBILITIES PERFORMED ON PREVIOUS CULTURE WITHIN THE LAST 5 DAYS.    Report Status 07/16/2015 FINAL  Final  Culture, blood (routine x 2)     Status: None (Preliminary result)   Collection Time: 07/16/15  5:23 AM  Result Value Ref Range Status   Specimen Description BLOOD LEFT HAND  Final   Special Requests ARPEDB 2CCS  Final   Culture PENDING  Incomplete   Report Status PENDING  Incomplete     Studies: No results found.  Scheduled Meds: . cefTRIAXone (ROCEPHIN)  IV  1 g Intravenous Q24H  . heparin  5,000 Units Subcutaneous 3 times per day  . insulin aspart  0-9 Units Subcutaneous 4 times per day  . nystatin  5 mL Oral QID  . octreotide  100 mcg Subcutaneous Q12H  . pantoprazole (PROTONIX) IV  40 mg Intravenous Q24H  . vancomycin  1,000 mg Intravenous Q48H   Continuous Infusions: . TPN (CLINIMIX) Adult without lytes     And  . fat emulsion    . TPN (CLINIMIX) Adult without lytes 83 mL/hr at 07/15/15 1726   And  . fat emulsion 240 mL (07/15/15 1726)      Time spent: [place], [place] [facility] Pager [phone] If 7PM-7AM, please contact night-coverage at [email or web address], password [facility] 07/16/2015, 4:00 PM  LOS: 3 days

## 2015-07-16 NOTE — Progress Notes (Signed)
While administering 4pm medications, Dr. Clementeen Graham walked into patient's room asking about chest pain. Patient had just told the surgeon that she was having pressure/left sided chest pain. I had just given prn morphine at 1547 for bilat leg pain, per patient. She did not mention chest pain at this time. When questioned again, she stated she's been having some chest pain since she worked with PT. EKG to be obtained. Will monitor.  Joellen Jersey, RN.

## 2015-07-16 NOTE — Progress Notes (Signed)
  Echocardiogram 2D Echocardiogram with Definity has been performed.  Marissa [name] 07/16/2015, 11:49 AM

## 2015-07-16 NOTE — Clinical Social Work Note (Signed)
CSW talked with patient's daughter Marissa [name] [phone]) regarding patient's facility placement and return to [facility] at discharge (full assessment to follow). Ms. Mortellaro wants another facility but is aware that because of the TPN feeding, patient may need to return to [facility].   Marissa [name], MSW, LCSW Licensed Clinical Social Worker [place] [phone]

## 2015-07-16 NOTE — Progress Notes (Signed)
[facility] [place] [facility] Progress Note     Subjective: Pt doing well, no N/V.  Not much pain.  80mL bilious output pooled in wound because suction was stuck to the eakins pouch sealing it off.  No complaints.  Objective: Vital signs in last 24 hours: Temp:  [98.2 F (36.8 C)-98.6 F (37 C)] 98.6 F (37 C) (11/03 0422) Pulse Rate:  [84-98] 98 (11/03 0422) Resp:  [17-18] 17 (11/03 0422) BP: (108-118)/(53-61) 118/60 mmHg (11/03 0422) SpO2:  [94 %-100 %] 100 % (11/03 0422) Weight:  [94.5 kg (208 lb 5.4 oz)] 94.5 kg (208 lb 5.4 oz) (11/02 2018) Last BM Date: 07/15/15  Intake/Output from previous day: 11/02 0701 - 11/03 0700 In: 2332 [IV Piggyback:100; LGX:2119] Out: 1000 [Urine:1000] Intake/Output this shift: Total I/O In: -  Out: 250 [Other:250]  PE: Gen:  Alert, NAD, pleasant Abd: Soft, ND, NT, some BS, no HSM, Midline wound mostly beefy red but with bile pooling in wound, fistula opening about 1.5cm in width.   Lab Results:   Recent Labs  07/13/15 1335 07/14/15 0420  WBC 8.1 8.5  HGB 10.7* 10.8*  HCT 32.1* 32.3*  PLT 187 190   BMET  Recent Labs  07/15/15 0420 07/16/15 0430  NA 130* 130*  K 4.0 4.0  CL 104 108  CO2 13* 11*  GLUCOSE 174* 167*  BUN 140* 149*  CREATININE 3.21* 3.32*  CALCIUM 9.7 9.4   PT/INR No results for input(s): LABPROT, INR in the last 72 hours. CMP     Component Value Date/Time   NA 130* 07/16/2015 0430   K 4.0 07/16/2015 0430   CL 108 07/16/2015 0430   CO2 11* 07/16/2015 0430   GLUCOSE 167* 07/16/2015 0430   BUN 149* 07/16/2015 0430   CREATININE 3.32* 07/16/2015 0430   CREATININE 1.64* 06/27/2014 1647   CALCIUM 9.4 07/16/2015 0430   PROT 8.6* 07/16/2015 0430   ALBUMIN 2.7* 07/16/2015 0430   AST 42* 07/16/2015 0430   ALT 58* 07/16/2015 0430   ALKPHOS 172* 07/16/2015 0430   BILITOT 0.3 07/16/2015 0430   GFRNONAA 13* 07/16/2015 0430   GFRAA 15* 07/16/2015 0430   Lipase     Component Value Date/Time   LIPASE 27  12/26/2014 1815       Studies/Results: No results found.  Anti-infectives: Anti-infectives    Start     Dose/Rate Route Frequency Ordered Stop   07/16/15 1000  vancomycin (VANCOCIN) 1,500 mg in sodium chloride 0.9 % 500 mL IVPB  Status:  Discontinued     1,500 mg 250 mL/hr over 120 Minutes Intravenous Every 48 hours 07/14/15 0956 07/15/15 1035   07/16/15 1000  vancomycin (VANCOCIN) IVPB 1000 mg/200 mL premix     1,000 mg 200 mL/hr over 60 Minutes Intravenous Every 48 hours 07/15/15 1035     07/14/15 1500  cefTRIAXone (ROCEPHIN) 1 g in dextrose 5 % 50 mL IVPB     1 g 100 mL/hr over 30 Minutes Intravenous Every 24 hours 07/13/15 1647     07/14/15 1015  vancomycin (VANCOCIN) 1,500 mg in sodium chloride 0.9 % 500 mL IVPB     1,500 mg 250 mL/hr over 120 Minutes Intravenous NOW 07/14/15 0943 07/14/15 1210   07/13/15 1400  cefTRIAXone (ROCEPHIN) 1 g in dextrose 5 % 50 mL IVPB     1 g 100 mL/hr over 30 Minutes Intravenous  Once 07/13/15 1359 07/13/15 1526       Assessment/Plan EC fistula -Eakin's pouch in place with about  250cc of bilious output since last night. I&O's are very important for the management of her fistula! -Dr. Bradd Burner saw the patient and said that she could be treated with abx with current PICC, at some point should probably have PICC exchanged due to length of time already in. Both blood cultures grew G+Cocci, but given no WBC or fever, no definite need for a holiday right now, unless ID recommends that. -ID following -cont sandostatin at 125mcg q 12h for now -cont NPO/TPN -May need to periodically tent the Eakins pouch to prevent the red rubber from being occluded.   ARF - Cr. 3.32 -Likely secondary to dehyration -IVFs per primary service and TNA Multiple medical problems -Per medical service    LOS: 3 days    [name] 07/16/2015, 10:25 AM Pager: [phone]

## 2015-07-16 NOTE — NC FL2 (Signed)
[place] LEVEL OF CARE SCREENING TOOL     IDENTIFICATION  Patient Name: Marissa [name] Birthdate: 02/21/1941 Sex: female Admission Date (Current Location): 07/13/2015  [place] and [place] Number: Kathleen Argue [id number] [place] and Address:  [facility]. [facility], [address] [facility] [address], [place], [place] [address]      Provider Number: [id number]  Attending Physician Name and Address:  Louellen Molder, MD  Relative Name and Phone Number:  Tesa Meadors - [phone]    Current Level of Care: Hospital Recommended Level of Care: Nursing Facility Prior Approval Number:    Date Approved/Denied:   PASRR Number: [id number] (Patient has existing PASRR number)  Discharge Plan: SNF    Current Diagnoses: Patient Active Problem List   Diagnosis Date Noted  . Acute on chronic renal failure ([place]) 07/13/2015  . Metabolic acidosis 71/69/6789  . Urinary tract infection 07/13/2015  . Oral thrush 07/13/2015  . Dehydration 06/04/2015  . Encephalopathy 06/04/2015  . Abdominal pain 06/04/2015  . Acute renal failure ([place]) 06/04/2015  . Hyponatremia 05/10/2015  . Acute idiopathic gout of left ankle   . Hyperkalemia 05/04/2015  . Type 2 diabetes mellitus without complication ([place])   . D-dimer, elevated 04/16/2015  . Precordial chest pain 04/15/2015  . 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 Abdominal wall abscess 03/13/2015  . Non-compliant behavior 01/22/2015  . Anemia, iron deficiency 12/21/2014  . Diverticulosis of colon with hemorrhage 12/20/2014  . Chronic diastolic CHF (congestive heart failure) ([place]) 06/29/2014  . Routine general medical examination at a health care facility 05/30/2014  . Dementia arising in the senium and presenium 03/29/2013  . Acute gouty arthritis 02/15/2012  . Other screening mammogram 02/15/2012  . Osteopenia 02/15/2012  . 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 Hyperlipidemia with target LDL less than 100 10/06/2011  . OSA  (obstructive sleep apnea) 04/02/2009  . GERD 08/20/2006  . Gout 08/15/2006  . Essential hypertension 08/15/2006  . CAD S/P OM BMS 8/07, ISR-OM DES 8/09 08/15/2006  . Chronic kidney disease, stage 3 08/15/2006  . DVT, HX OF 08/15/2006    Orientation ACTIVITIES/SOCIAL BLADDER RESPIRATION    Self, Place, Situation  Passive Continent Normal  BEHAVIORAL SYMPTOMS/MOOD NEUROLOGICAL BOWEL NUTRITION STATUS      Continent Diet (Currently NPO except for sips of water with meds)  PHYSICIAN VISITS COMMUNICATION OF NEEDS Height & Weight Skin    Verbally   208 lbs. Other (Comment) (Full thickness wound to midline abd with fistula to abdomen-Eakin's pouch atached to suction)          AMBULATORY STATUS RESPIRATION    Assist extensive (Patient unable to ambulate at this time-abd wound hooked to suction on wall) Normal      Personal Care Assistance Level of Assistance  Bathing, Dressing, Feeding Bathing Assistance: Limited assistance Feeding assistance: Independent Dressing Assistance: Limited assistance      Functional Limitations [place]  PT (By licensed PT)     PT Frequency: 2X per week (PT evaluation 11/1)             Additional Factors Info  Code Status, Allergies Code Status Info: DNR Allergies Info: No Known Allergies           Current Medications (07/16/2015): Current Facility-Administered Medications  Medication Dose Route Frequency Provider Last Rate Last Dose  . alum & mag hydroxide-simeth (MAALOX/MYLANTA) 200-200-20 MG/5ML suspension 30 mL  30 mL Oral Q6H PRN  Bobby Rumpf [place], PA-C      . cefTRIAXone (ROCEPHIN) 1 g in dextrose 5 % 50 mL IVPB  1 g Intravenous Q24H Melton Alar, PA-C   1 g at 07/16/15 1403  . diclofenac sodium (VOLTAREN) 1 % transdermal gel 2 g  2 g Topical QID PRN Melton Alar, PA-C      . diphenhydrAMINE (BENADRYL) 12.5 MG/5ML elixir 25 mg  25 mg Oral Q8H PRN Melton Alar, PA-C      . TPN  [facility]) Adult without lytes   Intravenous Cyclic-See Admin Instructions Lauren D Bajbus, RPH       And  . fat emulsion 20 % infusion 234 mL  234 mL Intravenous Cyclic-See Admin Instructions Lauren D Bajbus, RPH      . TPN (CLINIMIX) Adult without lytes   Intravenous Continuous TPN Lauren D Bajbus, RPH 83 mL/hr at 07/15/15 1726     And  . fat emulsion 20 % infusion 240 mL  240 mL Intravenous Continuous TPN Lauren D Bajbus, RPH 10 mL/hr at 07/15/15 1726 240 mL at 07/15/15 1726  . Glycerin (Adult) 2.1 G suppository 1 suppository  1 suppository Rectal PRN Melton Alar, PA-C      . heparin injection 5,000 Units  5,000 Units Subcutaneous 3 times per day Melton Alar, PA-C   5,000 Units at 07/16/15 1400  . insulin aspart (novoLOG) injection 0-9 Units  0-9 Units Subcutaneous 4 times per day Almeta Monas Bajbus, RPH   2 Units at 07/16/15 1611  . morphine 2 MG/ML injection 2-4 mg  2-4 mg Intravenous Q4H PRN Melton Alar, PA-C   2 mg at 07/16/15 1547  . nitroGLYCERIN (NITROSTAT) SL tablet 0.4 mg  0.4 mg Sublingual Q5 min PRN Melton Alar, PA-C      . nystatin (MYCOSTATIN) 100000 UNIT/ML suspension 500,000 Units  5 mL Oral QID Melton Alar, PA-C   500,000 Units at 07/16/15 1402  . octreotide (SANDOSTATIN) injection 100 mcg  100 mcg Subcutaneous Q12H Saverio Danker, PA-C   100 mcg at 07/16/15 0855  . ondansetron (ZOFRAN) tablet 4 mg  4 mg Oral Q6H PRN Melton Alar, PA-C       Or  . ondansetron [facility]) injection 4 mg  4 mg Intravenous Q6H PRN Melton Alar, PA-C      . oxyCODONE (Oxy IR/ROXICODONE) immediate release tablet 5 mg  5 mg Oral Q6H PRN Melton Alar, PA-C      . pantoprazole (PROTONIX) injection 40 mg  40 mg Intravenous Q24H Melton Alar, PA-C   40 mg at 07/15/15 1830  . sodium chloride 0.9 % injection 10-40 mL  10-40 mL Intracatheter PRN Waldemar Dickens, MD   10 mL at 07/14/15 0419  . vancomycin (VANCOCIN) IVPB 1000 mg/200 mL premix  1,000 mg Intravenous Q48H Donalynn Furlong  [place], RPH   1,000 mg at 07/16/15 1230   Do not use this list as official medication orders. Please verify with discharge summary.  Discharge Medications:   Medication List    ASK your doctor about these medications        acetaminophen 325 MG tablet  Commonly known as:  TYLENOL  Take 650 mg by mouth every 6 (six) hours as needed (general discomfort).     colchicine 0.6 MG tablet  Take 0.6 mg by mouth daily.     cyanocobalamin 2000 MCG tablet  Take 1 tablet (2,000 mcg total) by mouth daily.  diphenhydrAMINE 25 mg capsule  Commonly known as:  BENADRYL  Take 25 mg by mouth every 4 (four) hours as needed for itching.     FERIVA 21/7 75-1 MG Tabs  Take 1 tablet by mouth daily.     indomethacin 50 MG capsule  Commonly known as:  INDOCIN  Take 50 mg by mouth 3 (three) times daily with meals. For 4 days     insulin aspart 100 UNIT/ML injection  Commonly known as:  novoLOG  Sliding scale  CBG 70 - 120: 0 units: CBG 121 - 150: 2 units; CBG 151 - 200: 3 units; CBG 201 - 250: 5 units; CBG 251 - 300: 8 units;CBG 301 - 350: 11 units; CBG 351 - 400: 15 units; CBG > 400 : 15 units and notify MD     methocarbamol 500 MG tablet  Commonly known as:  ROBAXIN  Take 1 tablet (500 mg total) by mouth every 8 (eight) hours as needed for muscle spasms.     NITROSTAT 0.4 MG SL tablet  Generic drug:  nitroGLYCERIN  DISSOLVE ONE TABLET UNDER THE TONGUE EVERY 5 MINUTES AS NEEDED FOR CHEST PAIN.  DO NOT EXCEED A TOTAL OF 3 DOSES IN 15 MINUTES     nystatin 100000 UNIT/ML suspension  Commonly known as:  MYCOSTATIN  Take 5 mLs (500,000 Units total) by mouth 4 (four) times daily.     octreotide 100 MCG/ML Soln injection  Commonly known as:  SANDOSTATIN  Inject 1 mL (100 mcg total) into the skin every 12 (twelve) hours.     oxycodone 5 MG capsule  Commonly known as:  OXY-IR  Take 5 mg by mouth every 6 (six) hours as needed (moderate to severe pain).     pantoprazole 40 MG tablet  Commonly  known as:  PROTONIX  Take 1 tablet (40 mg total) by mouth daily.     traMADol 50 MG tablet  Commonly known as:  ULTRAM  Take 50 mg by mouth every 6 (six) hours as needed (pain).     UNABLE TO FIND  ADULT TPN per skilled nursing facility protocol QHS/every night (untreated discontinued by surgery).     VOLTAREN 1 % Gel  Generic drug:  diclofenac sodium  Apply 2 g topically 4 (four) times daily as needed (pain).        Relevant Imaging Results:  Relevant Lab Results:  Recent Labs    Additional Information    Sable Feil, LCSW

## 2015-07-17 ENCOUNTER — Inpatient Hospital Stay (HOSPITAL_COMMUNITY): Payer: Medicare Other

## 2015-07-17 DIAGNOSIS — R7881 Bacteremia: Secondary | ICD-10-CM

## 2015-07-17 LAB — PHOSPHORUS: Phosphorus: 3.8 mg/dL (ref 2.5–4.6)

## 2015-07-17 LAB — BASIC METABOLIC PANEL
ANION GAP: 12 (ref 5–15)
BUN: 156 mg/dL — ABNORMAL HIGH (ref 6–20)
CO2: 9 mmol/L — AB (ref 22–32)
Calcium: 9.4 mg/dL (ref 8.9–10.3)
Chloride: 106 mmol/L (ref 101–111)
Creatinine, Ser: 3.32 mg/dL — ABNORMAL HIGH (ref 0.44–1.00)
GFR calc Af Amer: 15 mL/min — ABNORMAL LOW (ref >60)
GFR calc non Af Amer: 13 mL/min — ABNORMAL LOW (ref >60)
Glucose, Bld: 150 mg/dL — ABNORMAL HIGH (ref 65–99)
POTASSIUM: 4.2 mmol/L (ref 3.5–5.1)
Sodium: 127 mmol/L — ABNORMAL LOW (ref 135–145)

## 2015-07-17 LAB — GLUCOSE, CAPILLARY
GLUCOSE-CAPILLARY: 224 mg/dL — AB (ref 65–99)
GLUCOSE-CAPILLARY: 105 mg/dL — AB (ref 65–99)
GLUCOSE-CAPILLARY: 99 mg/dL (ref 65–99)
Glucose-Capillary: 86 mg/dL (ref 65–99)
Glucose-Capillary: 106 mg/dL — ABNORMAL HIGH (ref 65–99)

## 2015-07-17 LAB — MAGNESIUM: Magnesium: 1.9 mg/dL (ref 1.7–2.4)

## 2015-07-17 MED ORDER — LACTATED RINGERS IV SOLN
INTRAVENOUS | Status: DC
Start: 2015-07-17 — End: 2015-07-18
  Administered 2015-07-17 – 2015-07-18 (×3): via INTRAVENOUS

## 2015-07-17 MED ORDER — LACTATED RINGERS IV BOLUS (SEPSIS)
1000.0000 mL | Freq: Once | INTRAVENOUS | Status: AC
  Administered 2015-07-17: 1000 mL via INTRAVENOUS

## 2015-07-17 MED ORDER — FAT EMULSION 20 % IV EMUL
240.0000 mL | INTRAVENOUS | Status: DC
  Filled 2015-07-17 (×2): qty 250

## 2015-07-17 MED ORDER — M.V.I. ADULT IV INJ
INJECTION | INTRAVENOUS | Status: DC
  Filled 2015-07-17: qty 1988

## 2015-07-17 NOTE — Progress Notes (Signed)
Patient ID: Marissa [name], female   DOB: 07-May-1941, 74 y.o.   MRN: [id number]     [facility]      [address] [place], [place] [address]    Phone: [phone] FAX: [phone]     Subjective: Pt seems down, but not unusual from my past experience with her.   I think ECF output was 445m which is documented under "other"   Objective:  Vital signs:  Filed Vitals:   07/16/15 0422 07/16/15 1000 07/16/15 1728 07/16/15 2000  BP: 118/60 106/60 105/62 133/61  Pulse: 98  90 92  Temp: 98.6 F (37 C) 98.2 F (36.8 C) 98.4 F (36.9 C) 97.5 F (36.4 C)  TempSrc: Oral Oral Oral Oral  Resp: _0 Height:      Weight:    95.754 kg (211 lb 1.6 oz)  SpO2: 100% 100% 100% 100%    Last BM Date: 07/15/15  Intake/Output   Yesterday:  11/03 0701 - 11/04 0700 In: 1254.6 [IV Piggyback:250; TPN:1004.6] Out: 1176 [Urine:726] This shift:    I/O last 3 completed shifts: In: 2048.6 [IV POQHUTMLYY:503]Out: 2026 [Urine:1576; Other:450]   Physical Exam: General: Pt awake/alert/oriented x4 in no acute distress Abdomen: Soft.  Nondistended.  Non tender.  Midline wound with eakin's pouch is place..  No evidence of peritonitis.  No incarcerated hernias.    Problem List:   Principal Problem:   Acute on chronic renal failure (HCC) Active Problems:   OSA (obstructive sleep apnea)   Essential hypertension   Chronic kidney disease, stage 3   Chronic diastolic CHF (congestive heart failure) (HCC)   Enterocutaneous fistula   Type 2 diabetes mellitus without complication (HCC)   Hyponatremia   Dehydration   Metabolic acidosis   Urinary tract infection   Oral thrush    Results:   Labs: Results for orders placed or performed during the hospital encounter of 07/13/15 (from the past 48 hour(s))  Glucose, capillary     Status: Abnormal   Collection Time: 07/15/15 11:40 AM  Result Value Ref Range   Glucose-Capillary 180 (H) 65 - 99  mg/dL  Glucose, capillary     Status: Abnormal   Collection Time: 07/15/15  4:08 PM  Result Value Ref Range   Glucose-Capillary 135 (H) 65 - 99 mg/dL  Glucose, capillary     Status: Abnormal   Collection Time: 07/15/15  8:16 PM  Result Value Ref Range   Glucose-Capillary 140 (H) 65 - 99 mg/dL  Glucose, capillary     Status: Abnormal   Collection Time: 07/16/15 12:36 AM  Result Value Ref Range   Glucose-Capillary 157 (H) 65 - 99 mg/dL  Glucose, capillary     Status: Abnormal   Collection Time: 07/16/15  4:15 AM  Result Value Ref Range   Glucose-Capillary 157 (H) 65 - 99 mg/dL  Comprehensive metabolic panel     Status: Abnormal   Collection Time: 07/16/15  4:30 AM  Result Value Ref Range   Sodium 130 (L) 135 - 145 mmol/L   Potassium 4.0 3.5 - 5.1 mmol/L   Chloride 108 101 - 111 mmol/L   CO2 11 (L) 22 - 32 mmol/L   Glucose, Bld 167 (H) 65 - 99 mg/dL   BUN 149 (H) 6 - 20 mg/dL   Creatinine, Ser 3.32 (H) 0.44 - 1.00 mg/dL   Calcium 9.4 8.9 - 10.3 mg/dL   Total Protein 8.6 (H) 6.5 - 8.1 g/dL  Albumin 2.7 (L) 3.5 - 5.0 g/dL   AST 42 (H) 15 - 41 U/L   ALT 58 (H) 14 - 54 U/L   Alkaline Phosphatase 172 (H) 38 - 126 U/L   Total Bilirubin 0.3 0.3 - 1.2 mg/dL   GFR calc non Af Amer 13 (L) >60 mL/min   GFR calc Af Amer 15 (L) >60 mL/min    Comment: (NOTE) The eGFR has been calculated using the CKD EPI equation. This calculation has not been validated in all clinical situations. eGFR's persistently <60 mL/min signify possible Chronic Kidney Disease.    Anion gap 11 5 - 15  Magnesium     Status: None   Collection Time: 07/16/15  4:30 AM  Result Value Ref Range   Magnesium 2.1 1.7 - 2.4 mg/dL  Phosphorus     Status: None   Collection Time: 07/16/15  4:30 AM  Result Value Ref Range   Phosphorus 4.4 2.5 - 4.6 mg/dL  Culture, blood (routine x 2)     Status: None (Preliminary result)   Collection Time: 07/16/15  5:10 AM  Result Value Ref Range   Specimen Description BLOOD LEFT HAND     Special Requests IN PEDIATRIC BOTTLE 2.5CC    Culture  Setup Time      GRAM POSITIVE COCCI IN CLUSTERS AEROBIC BOTTLE ONLY CRITICAL RESULT CALLED TO, READ BACK BY AND VERIFIED WITH: MIKKI MURPHY _0  07/17/15 MKELLY    Culture PENDING    Report Status PENDING   Culture, blood (routine x 2)     Status: None (Preliminary result)   Collection Time: 07/16/15  5:23 AM  Result Value Ref Range   Specimen Description BLOOD LEFT HAND    Special Requests ARPEDB 2CCS    Culture PENDING    Report Status PENDING   Glucose, capillary     Status: Abnormal   Collection Time: 07/16/15  7:37 AM  Result Value Ref Range   Glucose-Capillary 193 (H) 65 - 99 mg/dL  Glucose, capillary     Status: Abnormal   Collection Time: 07/16/15 12:45 PM  Result Value Ref Range   Glucose-Capillary 196 (H) 65 - 99 mg/dL  Glucose, capillary     Status: Abnormal   Collection Time: 07/16/15  4:10 PM  Result Value Ref Range   Glucose-Capillary 180 (H) 65 - 99 mg/dL  Glucose, capillary     Status: Abnormal   Collection Time: 07/16/15  8:54 PM  Result Value Ref Range   Glucose-Capillary 219 (H) 65 - 99 mg/dL  Basic metabolic panel     Status: Abnormal   Collection Time: 07/17/15  4:24 AM  Result Value Ref Range   Sodium 127 (L) 135 - 145 mmol/L   Potassium 4.2 3.5 - 5.1 mmol/L   Chloride 106 101 - 111 mmol/L   CO2 9 (L) 22 - 32 mmol/L   Glucose, Bld 150 (H) 65 - 99 mg/dL   BUN 156 (H) 6 - 20 mg/dL   Creatinine, Ser 3.32 (H) 0.44 - 1.00 mg/dL   Calcium 9.4 8.9 - 10.3 mg/dL   GFR calc non Af Amer 13 (L) >60 mL/min   GFR calc Af Amer 15 (L) >60 mL/min    Comment: (NOTE) The eGFR has been calculated using the CKD EPI equation. This calculation has not been validated in all clinical situations. eGFR's persistently <60 mL/min signify possible Chronic Kidney Disease.    Anion gap 12 5 - 15  Magnesium     Status: None  Collection Time: 07/17/15  4:24 AM  Result Value Ref Range   Magnesium 1.9 1.7 - 2.4 mg/dL   Phosphorus     Status: None   Collection Time: 07/17/15  4:24 AM  Result Value Ref Range   Phosphorus 3.8 2.5 - 4.6 mg/dL  Glucose, capillary     Status: Abnormal   Collection Time: 07/17/15  7:57 AM  Result Value Ref Range   Glucose-Capillary 224 (H) 65 - 99 mg/dL    Imaging / Studies: No results found.  Medications / Allergies:  Scheduled Meds: . cefTRIAXone (ROCEPHIN)  IV  1 g Intravenous Q24H  . heparin  5,000 Units Subcutaneous 3 times per day  . insulin aspart  0-9 Units Subcutaneous 4 times per day  . nystatin  5 mL Oral QID  . octreotide  100 mcg Subcutaneous Q12H  . pantoprazole (PROTONIX) IV  40 mg Intravenous Q24H  . vancomycin  1,000 mg Intravenous Q48H   Continuous Infusions: . TPN (CLINIMIX) Adult without lytes 118 mL/hr at 07/16/15 1849   And  . fat emulsion 234 mL (07/16/15 1745)   PRN Meds:.alum & mag hydroxide-simeth, diclofenac sodium, diphenhydrAMINE, Glycerin (Adult), morphine injection, nitroGLYCERIN, ondansetron **OR** ondansetron (ZOFRAN) IV, oxyCODONE, sodium chloride  Antibiotics: Anti-infectives    Start     Dose/Rate Route Frequency Ordered Stop   07/16/15 1000  vancomycin (VANCOCIN) 1,500 mg in sodium chloride 0.9 % 500 mL IVPB  Status:  Discontinued     1,500 mg 250 mL/hr over 120 Minutes Intravenous Every 48 hours 07/14/15 0956 07/15/15 1035   07/16/15 1000  vancomycin (VANCOCIN) IVPB 1000 mg/200 mL premix     1,000 mg 200 mL/hr over 60 Minutes Intravenous Every 48 hours 07/15/15 1035     07/14/15 1500  cefTRIAXone (ROCEPHIN) 1 g in dextrose 5 % 50 mL IVPB     1 g 100 mL/hr over 30 Minutes Intravenous Every 24 hours 07/13/15 1647     07/14/15 1015  vancomycin (VANCOCIN) 1,500 mg in sodium chloride 0.9 % 500 mL IVPB     1,500 mg 250 mL/hr over 120 Minutes Intravenous NOW 07/14/15 0943 07/14/15 1210   07/13/15 1400  cefTRIAXone (ROCEPHIN) 1 g in dextrose 5 % 50 mL IVPB     1 g 100 mL/hr over 30 Minutes Intravenous  Once 07/13/15 1359  07/13/15 1526        Assessment/Plan ECF-eakin's pouch, appreciate WOC management. -accurate I&Os -sandostatin ID-BCs with GPC.  ID following.  May need to have PICC line removed, but will certainly leave up to ID FEN-NPO PCM-TPN Acute on chronic renal failure-medicine consulting renal VTE prophylaxis-SCD/heparin   Erby Pian, [facility] Pager [phone])   07/17/2015 10:06 AM

## 2015-07-17 NOTE — Progress Notes (Signed)
[facility] PROGRESS NOTE  Marissa [name] NOB:[id number] DOB: 13-Feb-1941 DOA: 07/13/2015 PCP: [name], MD   Brief narrative 74 y.o. female, with chronic kidney disease, obstructive sleep apnea, diastolic heart failure, type 2 diabetes,   multiple abdominal surgeries, and a recent discharge after care of an enterocutaneous fistula, presented to the emergency department from [facility] with lethargy, 2 days of vomiting and severe dehydration. On 04/28/15 the patient underwent abdominal wound exploration of enterocutaneous fistula and abscess by Dr. Georgette Dover. She was admitted to the hospital on 9/22 with increased ostomy output and confusion. She was treated with TPN and Sandostatin. An eakin pouch was placed. Once she stabilized she was discharged on 10/4 to SNF. [facility] Kitchen Per the patient's daughter, Dr. Georgette Dover felt the patient could begin to take oral food & drink approximately 2 weeks ago. The patient's TPN was discontinued at some point after that. The patient ate fairly well for 4-5 days but then decreased her oral intake. One week back patient's enterocutaneous fistula began to drain green bile once again. Her stools became the color of green bile. Yesterday prior to admission patient had several episodes of biliary vomiting. In the emergency department the patient's bicarbonate was 12, her creatinine is elevated to 3.44 from 1.75, her BUN is 160, sodium 129. Lactic acid was normal. UA positive for UTI. Patient admitted to hospitalist service and general surgery following.   Assessment/Plan: Acute on chronic kidney injury with metabolic acidosis prerenal secondary to dehydration. Has underlying chronic kidney disease stage III. Not improved with gentle hydration. Renal ultrasound unremarkable. Appreciate renal consult. Patient started on Ringer's lactate @ 125 mL/ hr Discontinued indomethacin, colchicine. Avoid all nephrotoxic agents.  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 Maintain nothing by mouth and started TNA. Eakin pounds placed to intermittent wall suction. Wound care following. Continue Sandostatin.  Coag negative staph bacteremia with sepsis 2/2 Seen by ID and recommend one week of vancomycin initially . However repeat blood cultures remained positive suggestive of line infection. Discontinued PICC line . Will probably need longer duration of vancomycin. Ordered repeat blood culture.  Proteus UTI Pansensitive. completed  5 days of empiric Rocephin.   Acute encephalopathy Possible secondary to sepsis and dehydration. Appears improved today.   Hyponatremia Secondary to dehydration. Increased fluids.  Oral thrush Continue nystatin  Chronic diastolic CHF EF of 94%. Currently hypovolemic and on gentle hydration.  Obstructive sleep apnea Continue bedtime CPAP  Type 2 diabetes mellitus Continue sliding scale insulin  Severe malnutrition with failure to thrive Holding TPN due to persistent bacteremia .     Code Status: DO NOT RESUSCITATE Family Communication: None at bedside Disposition Plan: currently inpatient   Consultants:  CCS  Procedures:  CT abdomen and pelvis  Antibiotics: IV vancomycin 11/1-- and Rocephin 10/31--11/4  HPI/Subjective: Seen and examined. Denies any symptoms. Repeat blood cultures positive.  Objective: Filed Vitals:   07/17/15 1100  BP: 127/82  Pulse: 88  Temp: 98 F (36.7 C)  Resp: 20    Intake/Output Summary (Last 24 hours) at 07/17/15 1520 Last data filed at 07/17/15 1300  Gross per 24 hour  Intake  348.9 ml  Output   1325 ml  Net -976.1 ml   Filed Weights   07/14/15 2019 07/15/15 2018 07/16/15 2000  Weight: 94.6 kg (208 lb 8.9 oz) 94.5 kg (208 lb 5.4 oz) 95.754 kg (211 lb 1.6 oz)    Exam:   General: Female lying in  bed not in distress, poorly communicative  HEENT: Pallor, moist oral mucosa,  supplemented  Chest: Clear to auscultation bilaterally  CVS: Normal S1 and S2, no murmurs  GI: Soft, nondistended, nontender, midline wound with fistula, ( eakin pouch)  Musculoskeletal: Warm, no edema  HQP:RFFMB and oriented. Poorly communicative  Data Reviewed: Basic Metabolic Panel:  Recent Labs Lab 07/13/15 1335 07/14/15 0420 07/15/15 0420 07/16/15 0430 07/17/15 0424  NA 129* 128* 130* 130* 127*  K 4.9 4.1 4.0 4.0 4.2  CL 106 105 104 108 106  CO2 12* 11* 13* 11* 9*  GLUCOSE 155* 160* 174* 167* 150*  BUN 160* 154* 140* 149* 156*  CREATININE 3.44* 3.50* 3.21* 3.32* 3.32*  CALCIUM 9.7 9.7 9.7 9.4 9.4  MG  --  2.0 2.3 2.1 1.9  PHOS  --  5.0* 5.6* 4.4 3.8   Liver Function Tests:  Recent Labs Lab 07/13/15 1335 07/14/15 0420 07/16/15 0430  AST 42* 46* 42*  ALT 52 57* 58*  ALKPHOS 176* 191* 172*  BILITOT 0.9 0.8 0.3  PROT 8.8* 8.1 8.6*  ALBUMIN 3.0* 3.0* 2.7*   No results for input(s): LIPASE, AMYLASE in the last 168 hours. No results for input(s): AMMONIA in the last 168 hours. CBC:  Recent Labs Lab 07/13/15 1335 07/14/15 0420  WBC 8.1 8.5  NEUTROABS 6.3 6.2  HGB 10.7* 10.8*  HCT 32.1* 32.3*  MCV 73.1* 73.1*  PLT 187 190   Cardiac Enzymes:  Recent Labs Lab 07/13/15 1335  TROPONINI <0.03   BNP (last 3 results)  Recent Labs  11/02/14 2340 03/19/15 1325  BNP 109.2* 141.6*    ProBNP (last 3 results) No results for input(s): PROBNP in the last 8760 hours.  CBG:  Recent Labs Lab 07/16/15 1610 07/16/15 2054 07/17/15 0757 07/17/15 1234 07/17/15 1325  GLUCAP 180* 219* 224* 105* 86    Recent Results (from the past 240 hour(s))  Culture, Urine     Status: None   Collection Time: 07/13/15 12:14 PM  Result Value Ref Range Status   Specimen Description URINE, RANDOM  Final   Special Requests ADDED 2258  Final   Culture >=100,000 COLONIES/mL PROTEUS MIRABILIS  Final   Report Status 07/16/2015 FINAL  Final   Organism ID, Bacteria  PROTEUS MIRABILIS  Final      Susceptibility   Proteus mirabilis - MIC*    AMPICILLIN <=2 SENSITIVE Sensitive     CEFAZOLIN <=4 SENSITIVE Sensitive     CEFTRIAXONE <=1 SENSITIVE Sensitive     CIPROFLOXACIN <=0.25 SENSITIVE Sensitive     GENTAMICIN <=1 SENSITIVE Sensitive     IMIPENEM 4 SENSITIVE Sensitive     NITROFURANTOIN 128 RESISTANT Resistant     TRIMETH/SULFA <=20 SENSITIVE Sensitive     AMPICILLIN/SULBACTAM <=2 SENSITIVE Sensitive     PIP/TAZO <=4 SENSITIVE Sensitive     * >=100,000 COLONIES/mL PROTEUS MIRABILIS  Blood culture (routine x 2)     Status: None   Collection Time: 07/13/15  2:50 PM  Result Value Ref Range Status   Specimen Description BLOOD PICC LINE  Final   Special Requests BOTTLES DRAWN AEROBIC AND ANAEROBIC 5ML  Final   Culture  Setup Time   Final    GRAM POSITIVE COCCI IN CLUSTERS IN BOTH AEROBIC AND ANAEROBIC BOTTLES CRITICAL RESULT CALLED TO, READ BACK BY AND VERIFIED WITH: TO BQIU(RN) BY TCLEVELAND 07/14/2015 AT 5:58 AM    Culture STAPHYLOCOCCUS SPECIES (COAGULASE NEGATIVE)  Final   Report Status 07/16/2015 FINAL  Final  Organism ID, Bacteria STAPHYLOCOCCUS SPECIES (COAGULASE NEGATIVE)  Final      Susceptibility   Staphylococcus species (coagulase negative) - MIC*    CIPROFLOXACIN <=0.5 SENSITIVE Sensitive     ERYTHROMYCIN <=0.25 SENSITIVE Sensitive     GENTAMICIN <=0.5 SENSITIVE Sensitive     OXACILLIN >=4 RESISTANT Resistant     TETRACYCLINE 2 SENSITIVE Sensitive     VANCOMYCIN 2 SENSITIVE Sensitive     TRIMETH/SULFA <=10 SENSITIVE Sensitive     CLINDAMYCIN >=8 RESISTANT Resistant     RIFAMPIN <=0.5 SENSITIVE Sensitive     Inducible Clindamycin NEGATIVE Sensitive     * STAPHYLOCOCCUS SPECIES (COAGULASE NEGATIVE)  Blood culture (routine x 2)     Status: None   Collection Time: 07/13/15  2:55 PM  Result Value Ref Range Status   Specimen Description BLOOD LEFT HAND  Final   Special Requests   Final    BOTTLES DRAWN AEROBIC AND ANAEROBIC BLUE 5CC,  RED 3CC   Culture  Setup Time   Final    GRAM POSITIVE COCCI IN CLUSTERS AEROBIC BOTTLE ONLY CRITICAL RESULT CALLED TO, READ BACK BY AND VERIFIED WITH: J NARAMDAS,RN AT 1122 07/14/15 BY L BENFIELD    Culture   Final    STAPHYLOCOCCUS SPECIES (COAGULASE NEGATIVE) SUSCEPTIBILITIES PERFORMED ON PREVIOUS CULTURE WITHIN THE LAST 5 DAYS.    Report Status 07/16/2015 FINAL  Final  Culture, blood (routine x 2)     Status: None (Preliminary result)   Collection Time: 07/16/15  5:10 AM  Result Value Ref Range Status   Specimen Description BLOOD LEFT HAND  Final   Special Requests IN PEDIATRIC BOTTLE 2.5CC  Final   Culture  Setup Time   Final    GRAM POSITIVE COCCI IN CLUSTERS AEROBIC BOTTLE ONLY CRITICAL RESULT CALLED TO, READ BACK BY AND VERIFIED WITH: MIKKI MURPHY @0541  07/17/15 MKELLY    Culture NO GROWTH 1 DAY  Final   Report Status PENDING  Incomplete  Culture, blood (routine x 2)     Status: None (Preliminary result)   Collection Time: 07/16/15  5:23 AM  Result Value Ref Range Status   Specimen Description BLOOD LEFT HAND  Final   Special Requests ARPEDB 2CCS  Final   Culture NO GROWTH 1 DAY  Final   Report Status PENDING  Incomplete     Studies: US Renal  07/17/2015  CLINICAL DATA:  Renal insufficiency EXAM: RENAL ULTRASOUND COMPARISON:  Renal ultrasound June 08, 2015; CT abdomen and pelvis July 13, 2015 FINDINGS: Right Kidney: Length: 11.1 cm. Echogenicity and renal cortical thickness are within normal limits. No perinephric fluid or hydronephrosis visualized. There is a cyst in the upper pole region measuring 1.5 x 1.5 x 1.5 cm. No sonographically demonstrable calculus or ureterectasis. Left Kidney: Length: 10.8 cm. Echogenicity and renal cortical thickness are within normal limits. No mass, perinephric fluid, or hydronephrosis visualized. No sonographically demonstrable calculus. Bladder: Completely empty and cannot be assessed. IMPRESSION: Cyst arising from upper pole right  kidney. Study otherwise unremarkable. Note the urinary bladder is empty and cannot be assessed. Electronically Signed   By: Lowella Grip III M.D.   On: 07/17/2015 11:45    Scheduled Meds: . heparin  5,000 Units Subcutaneous 3 times per day  . insulin aspart  0-9 Units Subcutaneous 4 times per day  . lactated ringers  1,000 mL Intravenous Once  . nystatin  5 mL Oral QID  . octreotide  100 mcg Subcutaneous Q12H  . pantoprazole (PROTONIX) IV  40  mg Intravenous Q24H  . vancomycin  1,000 mg Intravenous Q48H   Continuous Infusions: . TPN (CLINIMIX) Adult without lytes     And  . fat emulsion    . lactated ringers        Time spent: [place], [place]  [facility] Pager [phone] If 7PM-7AM, please contact night-coverage at [email or web address], password [facility] 07/17/2015, 3:20 PM  LOS: 4 days

## 2015-07-17 NOTE — Clinical Social Work Note (Signed)
CSW continuing to monitor patient's progress and patient not yet ready for d/c back to [facility].  Patient's insurance is [employer] and authorization will have to be received prior to patient returning to facility. CSW will not be able to receive auth over the weekend.  CSW will continue to follow and assist with d/c back to facility when medically stable.   Aylana Hirschfeld Givens, MSW, LCSW Licensed Clinical Social Worker [place] [phone]

## 2015-07-17 NOTE — Consult Note (Signed)
Nephrology Consult Note  Patient name: Marissa [name] Medical record number: [id number] Date of birth: 01-09-1941 Age: 74 y.o. Gender: female  Primary Care Provider: [name], MD  Pt Overview and Major Events to Date:  10/31: Admitted with severe dehydration, Acute on Chronic Renal Failure with preserved output, Hyponatremia, Increased Fistula Output, Metabolic non-gap acidosis, UTI, Gram Positive Cocci Bacteremia. 11/1 - 11/3: Ongoing Rehydration and restart of TPN, Management of Fistula per surgery with replacement of Eakin pouch and restart Sandostatin, Infectious Disease with recommendations regarding Bacteremia and ongoing antibiotic treatment and PICC management. Elevated, but stable creatinine. Good urine output.  11/4: Pt. With repeat positive blood cultures GPC. Afebrile. Creatinine stable. Urine output without accurate measurement.        Assessment and Plan: 74 y/o F with recent hospitalization for ECF management. Hx of Multiple abdominal surgeries with subsequent ECF formation, TPN dependence, CKD III-IV, OSA, DMII, CHFpEF, Malnutrition with Failure to thrive.   AKI: Prerenal Azotemia most likely. Pt. With recent severe dehydration requiring volume repletion. She had been without TPN and poor po intake 2/2 nausea / vomiting for nearly a week in the setting of increased fistula output. Weight down nearly 20lbs on admission. Cr at 3.32 baseline appears to be 1.5-1.7, BUN 140-150 with asterixis. K, Mg, Phos, appropriate.  - Given acute rise in Cr > rule out obstructive process with renal ultrasound / bladder scan.  - Check CK - Getting TPN, but would add LR given acidosis, and Overall fluid deficit. Weight on admission 207lb 10/31, Wt on discharge 10/4 226lb - No magnesium or phosphorous containing cathartics.   Electrolytes: Pt. With mild hyponatremia likely corrects with significant Uremia. Likely due to poor po intake, high ostomy output. Getting TPN. K, Mg, Phos are ok. Ca++ is  appropriate.  - Would replete with additional LR as above.   Non-Gap Metabolic Acidosis : Due to high output ostomy. ARF. Lactate checked on admission and 0.5.  - LR would help with this.  - Continue to monitor on a daily basis. Hold off on Bicarb supplementation for now.   ECF: Eakin pouch. TPN. Continued management per surgery.  - Sandostatin - Close I/O's to monitor output.   ID: GPC bacteremia with Coag Negative Staph, Proteus UTI. ID on board. Managing PICC. Has been afebrile.  - Further PICC mgmt per ID and Primary team.  - On Ceftriaxone and Vacomycin for GPC and proteus.  - Echo results still pending.   Encephalopathy - due to Uremia / Azotemia. Mild electrolyte disturbance, Bacteremia.  - Ongoing mgmt per primary team.  - Hopefully will improve with improvement in BUN and fluid supplementation.   Malnutrition / Failure to Thrive - continue TPN, mgmt of fistula.   CHFpEF - Echo 11/3 EF 60-65%. Up 4lbs this hospitalization.  - No volume overload at this time. Volume down if anything.    FEN/GI: TPN PPx: heparin  Disposition: pending improvement in fistula output and renal improvement.   Subjective:  Pt. Admitted with recent discharge from hospital 1 month ago after management of ECF that has been difficult. She was discharged to SNF with PICC line on TPN at discharge, and at follow up fistula output had come down. She was transitioned to PO diet two weeks ago, but apparently developed nausea, vomiting, increased output from her fistula. TPN was discontinued 1 week ago, and she continued with poor PO intake and nausea / vomiting with high output fistula. This all led to severe dehydration, subsequent altered mental status /  acute encephalopathy which prompted her facility to bring her to the hospital. Here she was found to have hyponatremia, dehydration, acute renal failure, and increased output from her fistula. Blood cultures and urinary analysis revealed Gram positive cocci  bacteremia and proteus UTI. The rest of her hospital course as noted above.   Today, she remains somewhat alert, will respond to questions, and oriented to name and location, but not to day or time. She says she is not in pain at the present. She denies nausea currently. She asks for the TV to be turned off.   Objective: Temp:  [97.4 F (36.3 C)-98.4 F (36.9 C)] 98 F (36.7 C) (11/04 1100) Pulse Rate:  [88-106] 88 (11/04 1100) Resp:  [18-21] 20 (11/04 1100) BP: (95-133)/(60-82) 127/82 mmHg (11/04 1100) SpO2:  [99 %-100 %] 99 % (11/04 1100) Weight:  [211 lb 1.6 oz (95.754 kg)] 211 lb 1.6 oz (95.754 kg) (11/03 2000) Physical Exam: General: NAD, laying in bed, intermittently lucid. HEENT: NCAT, PERRLA, Tachy mucus membranes.   Cardiovascular: RRR, no MGR audible, 2+ distal pulses.  Respiratory: CTA Bilaterally, appropriate rate, unlabored.  Abdomen: S, Eakin pouch in place and placed to suction with green output / drainage, Nontender abdomen. Some skin changes around the ECF site without signs of infection.  Extremities: WWP, 2+ distal pulses. No edema.  Neuro: Asterixis, Orientation as above, No weakness or numbness to exam.   Laboratory:  Recent Labs Lab 07/13/15 1335 07/14/15 0420  WBC 8.1 8.5  HGB 10.7* 10.8*  HCT 32.1* 32.3*  PLT 187 190    Recent Labs Lab 07/13/15 1335 07/14/15 0420 07/15/15 0420 07/16/15 0430 07/17/15 0424  NA 129* 128* 130* 130* 127*  K 4.9 4.1 4.0 4.0 4.2  CL 106 105 104 108 106  CO2 12* 11* 13* 11* 9*  BUN 160* 154* 140* 149* 156*  CREATININE 3.44* 3.50* 3.21* 3.32* 3.32*  CALCIUM 9.7 9.7 9.7 9.4 9.4  PROT 8.8* 8.1  --  8.6*  --   BILITOT 0.9 0.8  --  0.3  --   ALKPHOS 176* 191*  --  172*  --   ALT 52 57*  --  58*  --   AST 42* 46*  --  42*  --   GLUCOSE 155* 160* 174* 167* 150*   Echo: Study Conclusions  - Left ventricle: The cavity size was normal. Wall thickness was increased in a pattern of mild LVH. Systolic function was  normal. The estimated ejection fraction was in the range of 60% to 65%. There was dynamic obstruction. Wall motion was normal; there were no regional wall motion abnormalities. Doppler parameters are consistent with abnormal left ventricular relaxation (grade 1 diastolic dysfunction).  Renal Ultrasound:  IMPRESSION: Cyst arising from upper pole right kidney. Study otherwise unremarkable. Note the urinary bladder is empty and cannot be Assessed.  Blood Cx:  11/3 - Gram positive Cocci Clusters 10/31 - Gram positive Cocci clusters, coag negative staph 10/31 - Coag negative staph  Urine Cx:  Proteus - pan sensitive.    Aquilla Hacker, MD 07/17/2015, 11:56 AM PGY-2, [place]

## 2015-07-17 NOTE — Clinical Social Work Note (Signed)
Clinical Social Work Assessment  Patient Details  Name: Marissa [name] MRN: [id number] Date of Birth: Dec 19, 1940  Date of referral:  07/14/15               Reason for consult:  Facility Placement                Permission sought to share information with:  Family Supports, Other (Patient only oriented to person and place - daughter is family contact) Permission granted to share information::  Yes, Verbal Permission Granted (Patient only oriented to person and place)  Name::     Marissa [name]  Agency::     Relationship::  Daughter  Contact Information:  [phone] or [phone]  Housing/Transportation Living arrangements for the past 2 months:  [place] of Information:  Adult Children (Daughter Marissa [name]) Patient Interpreter Needed:  None Criminal Activity/Legal Involvement Pertinent to Current Situation/Hospitalization:  No - Comment as needed Significant Relationships:  Adult Children Lives with:  Facility Resident Do you feel safe going back to the place where you live?  Yes Need for family participation in patient care:  Yes (Comment)  Care giving concerns:  None expressed by daughter   Social Worker assessment / plan:  On 11/3, CSW talked with patient regarding discharge plans for patient. Daughter is not happy with the care patient is receiving at current facility and requested another facility. Her preferences are [facility] or [facility]. CSW advised Marissa [name] that a facility search will be done, however the challenge will be finding another facility that does the TPN feedings.  Daughter expressed understanding.  On 11/4 daughter informed that the facility search was done, but only one facility response, and the name of this facility was provided to daughter. Marissa [name] then requested that patient return to [facility].  As of 11/4 patient not medically stable for discharge.    Employment status:  Retired Office manager, Medicaid In [place] PT Recommendations:    Information / Referral to community resources:  Other (Comment Required) (Not needed or requested at this time)  Patient/Family's Response to care:  No concerns expressed regarding patient's care at hospital.  Patient/Family's Understanding of and Emotional Response to Diagnosis, Current Treatment, and Prognosis:  Not discussed.  Emotional Assessment Appearance:  Appears stated age (CSW visited room looking for family, and did not talk with patient) Attitude/Demeanor/Rapport:  Unable to Assess Affect (typically observed):  Unable to Assess Orientation:  Oriented to Self, Oriented to Place Alcohol / Substance use:  Never Used Psych involvement (Current and /or in the community):  No (Comment)  Discharge Needs  Concerns to be addressed:  Discharge Planning Concerns Readmission within the last 30 days:  No Current discharge risk:  None Barriers to Discharge:  No Barriers Identified   Marissa Feil, LCSW 07/17/2015, 7:07 PM

## 2015-07-17 NOTE — Consult Note (Signed)
WOC wound follow up Current pouch is leaking behind the barrier to lower abd area. Wound type: Full thickness wound to midline abd with fistula Wound bed: Dark red, small amt bleeding to wound edges Drainage (amount, consistency, odor) Large amt green drainage in cannister Periwound: Red macerated weeping skin with partial thickness breakdown from moisture associated skin damage related to previous pouch leakage, is slowly improving. Dressing procedure/placement/frequency: Applied ostomy powder, then skin prep to protect skin, then large Eakin pouch with red rubber catheter to low wall suction to control drainage and promote healing to macerated skin. Dressing change much less painful. [place] team will continue to follow. Supplies at bedside and instructions provided for staff nurses if leakage occurs during the weekend. Julien Girt MSN, RN, [place], [place], [place]

## 2015-07-17 NOTE — Progress Notes (Signed)
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   Recent Labs  07/15/15 0420 07/16/15 0430 07/17/15 0424  NA 130* 130* 127*  K 4.0 4.0 4.2  CL 104 108 106  CO2 13* 11* 9*  GLUCOSE 174* 167* 150*  BUN 140* 149* 156*  CREATININE 3.21* 3.32* 3.32*  CALCIUM 9.7 9.4 9.4  MG 2.3 2.1 1.9  PHOS 5.6* 4.4 3.8  PROT  --  8.6*  --   ALBUMIN  --  2.7*  --   AST  --  42*  --   ALT  --  58*  --   ALKPHOS  --  172*  --   BILITOT  --  0.3  --    Estimated Creatinine Clearance: 16.7 mL/min (by C-G formula based on Cr of 3.32).    Recent Labs  07/16/15 1610 07/16/15 2054 07/17/15 0757  GLUCAP 180* 219* 224*    Medications:  Scheduled:  . cefTRIAXone (ROCEPHIN)  IV  1 g Intravenous Q24H  . heparin  5,000 Units Subcutaneous 3 times per day  . insulin aspart  0-9 Units Subcutaneous 4 times per day  . nystatin  5 mL Oral QID  . octreotide  100 mcg Subcutaneous Q12H  . pantoprazole (PROTONIX) IV  40 mg Intravenous Q24H  . vancomycin  1,000 mg Intravenous Q48H    Insulin Requirements in the past 24 hours:  7 units SSI, no insulin in TPN  Nutritional Goals: per RD note 11/1 1700-1900 kCal, 100-110 grams of protein per day  Current Nutrition:  Clinimix E 5/15 at 67mL/hr + IVFE 20% at 60mL/hr-provides 99g protein and 1894kcal which meets ~100% of previous goals  Per discussions/notes from [facility], was receiving cycled Clinimix 5/15 alternated with E 5/15, 2069ml over 12 hours  (2000-0800). Per RN from facility [facility], patient was receiving TPN during her entire stay, however patient's admission labs/dehydrated state as well as report from daughter make this questionable.  Assessment: 30 YOF on chronic TPN for a fistula who was admitted from [facility] with lethargy, 2 days of vomiting, increased drainage from fistula and severe dehydration. Her EC output was bilious and Eakin's pouch was removed at facility. CT scan negative for infection.  Surgeries/Procedures: 04/28/15: wound exploration, explanation of abdominal mesh Multiple other abdominal surgeries  GI:  Eakin pouch replaced- with suction. Wound care following. Sandostatin resumed. On IV PPI as well.  Prealbumin 30.3, wnl. With weight loss over the past month.  Fistula with 453mL out.  Endo: A1C this admission is 6.2 (up from 5.7 in July 2016) CBGs 219 & 224 on tpn.  Currently on 18hr cycle, 1st night. On SSI.  Lytes: Na 127 ~stable. K 4.2, CorCa good at ~10.2, Mg & Phos wnl. (Ca x phos 40)  Renal:  CKD3, with AKI 2nd dehyrdation.  Cr 3.32 with BUN 156- minimal improvement.  UOP 0.59ml/kg/hr.  I/O (+)378.  Gentle hydration.  Hepatobil: AST, ALT and alk phos all mildly elevated. Tbili nml. Albumin low at 2.7. Trigs  normal at 62  Pulm:  OSA.  RA. CPAP at night  Cards: EF 55%; VSS- no meds  Neuro: lethargic, poss 2nd dehydration/sepsis  ID: Nystatin for thrush.  Vanc for CNS bacteremia & Rocephin for Proteus UTI.  OK to treat without line holiday or stoppage of TPN per ID.  AFeb  10/31 BCx: 2/2 CoNS 10/31 urine: proteus (pan sens except to nitrofurantoin)  Rocephin 10/31>> vanc 11/1>>  Best Practices: subq hep  TPN Access: PICC placed in Sept- Some discussion around switching out PICC, but not yet done TPN start date: chronic TPN- was on at home prior to admission in Sept, and continued after discharge. resumed 11/1 at the hospital  Plan: Clinimix NO LYTES 5/15- to run over an 18 hr  cycle. IVFE 20% to run at 51mL/hr over 18 hr (to provide 99g protein and 1880kcal) MVI and trace elements in TPN CBGs and SSI to 0800, 1300, 1600, and 2000 TPN labs qMon/Thurs F/U CBG off TPN   Gracy Bruins, PharmD Clinical Pharmacist [facility]

## 2015-07-18 DIAGNOSIS — E875 Hyperkalemia: Secondary | ICD-10-CM

## 2015-07-18 LAB — BASIC METABOLIC PANEL
Anion gap: 15 (ref 5–15)
BUN: 156 mg/dL — ABNORMAL HIGH (ref 6–20)
CO2: 8 mmol/L — ABNORMAL LOW (ref 22–32)
Calcium: 10 mg/dL (ref 8.9–10.3)
Chloride: 110 mmol/L (ref 101–111)
Creatinine, Ser: 3.48 mg/dL — ABNORMAL HIGH (ref 0.44–1.00)
GFR calc Af Amer: 14 mL/min — ABNORMAL LOW (ref >60)
GFR calc non Af Amer: 12 mL/min — ABNORMAL LOW (ref >60)
Glucose, Bld: 126 mg/dL — ABNORMAL HIGH (ref 65–99)
Potassium: 5.5 mmol/L — ABNORMAL HIGH (ref 3.5–5.1)
Sodium: 133 mmol/L — ABNORMAL LOW (ref 135–145)

## 2015-07-18 LAB — GLUCOSE, CAPILLARY
GLUCOSE-CAPILLARY: 154 mg/dL — AB (ref 65–99)
GLUCOSE-CAPILLARY: 145 mg/dL — AB (ref 65–99)
GLUCOSE-CAPILLARY: 137 mg/dL — AB (ref 65–99)
Glucose-Capillary: 125 mg/dL — ABNORMAL HIGH (ref 65–99)

## 2015-07-18 LAB — CULTURE, BLOOD (ROUTINE X 2)

## 2015-07-18 MED ORDER — SODIUM BICARBONATE 8.4 % IV SOLN
INTRAVENOUS | Status: DC
  Administered 2015-07-18 – 2015-07-21 (×7): via INTRAVENOUS
  Filled 2015-07-18 (×10): qty 1000

## 2015-07-18 NOTE — Procedures (Signed)
Pt refuses the use of CPAP.  

## 2015-07-18 NOTE — Progress Notes (Signed)
Patient refused CPAP.

## 2015-07-18 NOTE — Progress Notes (Signed)
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   Subjective:  Lost PIV this AM COnt to have poor mental status No labs yet this AM   11/04 0701 - 11/05 0700 In: 2560 [P.O.:60; I.V.:1500; IV Piggyback:1000] Out: 1000 [Urine:900]  Filed Weights   07/15/15 2018 07/16/15 2000 07/17/15 2030  Weight: 94.5 kg (208 lb 5.4 oz) 95.754 kg (211 lb 1.6 oz) 97.387 kg (214 lb 11.2 oz)    Scheduled Meds: . heparin  5,000 Units Subcutaneous 3 times per day  . insulin aspart  0-9 Units Subcutaneous 4 times per day  . nystatin  5 mL Oral QID  . octreotide  100 mcg Subcutaneous Q12H  . pantoprazole (PROTONIX) IV  40 mg Intravenous Q24H  . vancomycin  1,000 mg Intravenous Q48H   Continuous Infusions: . lactated ringers 125 mL/hr at 07/18/15 0622   PRN Meds:.diphenhydrAMINE, Glycerin (Adult), morphine injection, nitroGLYCERIN, ondansetron **OR** ondansetron (ZOFRAN) IV, oxyCODONE, sodium chloride  Current Labs: reviewed    Physical Exam:  Blood pressure 109/46, pulse 97, temperature 97.6 F (36.4 C), temperature source Oral, resp. rate 18, height 5\' 4"  (1.626 m), weight 97.387 kg (214 lb 11.2 oz), SpO2 100 %. Not very interactive, eyes open, tracts Asterixus mildly improved from yesterday RRR, no rub Eakins pouch noted  A/P 1. AoCKD with profound azotemia 1. Suspecte hypovolemia + NSAID 2. Baseline SCr = 1.7s 3. Renal US 11/4 w/o obstruction 4. Looks to be 7kg below recent discharge weight with history compatible with fluid loss 5. Cont hydration wit LR, follow up labs Daily weights, Daily Renal Panel, Strict I/Os, Avoid nephrotoxins (NSAIDs, judicious IV Contrast) 1. Chrnoic enterocutaneous fistula, intermittent high outpt, TPN dependency 2. NAG metabolic acidosis: likely related to GI losses; follow with hydration with LR 3. AMS: likely  multifactorial including uremia, cont hydration and follow; no RRT needed as very possible that IVFs will help 4. GPC bacteremia and Proteus UTI 1. PICC out 2. ABX per primary  Pearson Grippe MD 07/18/2015, 8:49 AM   Recent Labs Lab 07/15/15 0420 07/16/15 0430 07/17/15 0424  NA 130* 130* 127*  K 4.0 4.0 4.2  CL 104 108 106  CO2 13* 11* 9*  GLUCOSE 174* 167* 150*  BUN 140* 149* 156*  CREATININE 3.21* 3.32* 3.32*  CALCIUM 9.7 9.4 9.4  PHOS 5.6* 4.4 3.8    Recent Labs Lab 07/13/15 1335 07/14/15 0420  WBC 8.1 8.5  NEUTROABS 6.3 6.2  HGB 10.7* 10.8*  HCT 32.1* 32.3*  MCV 73.1* 73.1*  PLT 187 190

## 2015-07-18 NOTE — Progress Notes (Signed)
ANTIBIOTIC CONSULT NOTE - FOLLOW UP  Pharmacy Consult for Vancomycin Indication: coag negative staph bacteremia/ PICC line infection  No Known Allergies  Patient Measurements: Height: 5\' 4"  (162.6 cm) Weight: 214 lb 11.2 oz (97.387 kg) IBW/kg (Calculated) : 54.7  Vital Signs: Temp: 97.7 F (36.5 C) (11/05 1000) Temp Source: Oral (11/05 1000) BP: 116/49 mmHg (11/05 1000) Pulse Rate: 97 (11/05 1000) Intake/Output from previous day: 11/04 0701 - 11/05 0700 In: 2560 [P.O.:60; I.V.:1500; IV Piggyback:1000] Out: 1000 [Urine:900] Intake/Output from this shift: Total I/O In: -  Out: 100 [Urine:100]  Labs:  Recent Labs  07/16/15 0430 07/17/15 0424 07/18/15 0940  CREATININE 3.32* 3.32* 3.48*   Estimated Creatinine Clearance: 16.1 mL/min (by C-G formula based on Cr of 3.48). No results for input(s): VANCOTROUGH, VANCOPEAK, VANCORANDOM, GENTTROUGH, GENTPEAK, GENTRANDOM, TOBRATROUGH, TOBRAPEAK, TOBRARND, AMIKACINPEAK, AMIKACINTROU, AMIKACIN in the last 72 hours.   Microbiology: Recent Results (from the past 720 hour(s))  Culture, Urine     Status: None   Collection Time: 07/13/15 12:14 PM  Result Value Ref Range Status   Specimen Description URINE, RANDOM  Final   Special Requests ADDED 2258  Final   Culture >=100,000 COLONIES/mL PROTEUS MIRABILIS  Final   Report Status 07/16/2015 FINAL  Final   Organism ID, Bacteria PROTEUS MIRABILIS  Final      Susceptibility   Proteus mirabilis - MIC*    AMPICILLIN <=2 SENSITIVE Sensitive     CEFAZOLIN <=4 SENSITIVE Sensitive     CEFTRIAXONE <=1 SENSITIVE Sensitive     CIPROFLOXACIN <=0.25 SENSITIVE Sensitive     GENTAMICIN <=1 SENSITIVE Sensitive     IMIPENEM 4 SENSITIVE Sensitive     NITROFURANTOIN 128 RESISTANT Resistant     TRIMETH/SULFA <=20 SENSITIVE Sensitive     AMPICILLIN/SULBACTAM <=2 SENSITIVE Sensitive     PIP/TAZO <=4 SENSITIVE Sensitive     * >=100,000 COLONIES/mL PROTEUS MIRABILIS  Blood culture (routine x 2)      Status: None   Collection Time: 07/13/15  2:50 PM  Result Value Ref Range Status   Specimen Description BLOOD PICC LINE  Final   Special Requests BOTTLES DRAWN AEROBIC AND ANAEROBIC 5ML  Final   Culture  Setup Time   Final    GRAM POSITIVE COCCI IN CLUSTERS IN BOTH AEROBIC AND ANAEROBIC BOTTLES CRITICAL RESULT CALLED TO, READ BACK BY AND VERIFIED WITH: TO BQIU(RN) BY TCLEVELAND 07/14/2015 AT 5:58 AM    Culture STAPHYLOCOCCUS SPECIES (COAGULASE NEGATIVE)  Final   Report Status 07/16/2015 FINAL  Final   Organism ID, Bacteria STAPHYLOCOCCUS SPECIES (COAGULASE NEGATIVE)  Final      Susceptibility   Staphylococcus species (coagulase negative) - MIC*    CIPROFLOXACIN <=0.5 SENSITIVE Sensitive     ERYTHROMYCIN <=0.25 SENSITIVE Sensitive     GENTAMICIN <=0.5 SENSITIVE Sensitive     OXACILLIN >=4 RESISTANT Resistant     TETRACYCLINE 2 SENSITIVE Sensitive     VANCOMYCIN 2 SENSITIVE Sensitive     TRIMETH/SULFA <=10 SENSITIVE Sensitive     CLINDAMYCIN >=8 RESISTANT Resistant     RIFAMPIN <=0.5 SENSITIVE Sensitive     Inducible Clindamycin NEGATIVE Sensitive     * STAPHYLOCOCCUS SPECIES (COAGULASE NEGATIVE)  Blood culture (routine x 2)     Status: None   Collection Time: 07/13/15  2:55 PM  Result Value Ref Range Status   Specimen Description BLOOD LEFT HAND  Final   Special Requests   Final    BOTTLES DRAWN AEROBIC AND ANAEROBIC BLUE 5CC, RED [place]  Culture  Setup Time   Final    GRAM POSITIVE COCCI IN CLUSTERS AEROBIC BOTTLE ONLY CRITICAL RESULT CALLED TO, READ BACK BY AND VERIFIED WITH: J NARAMDAS,RN AT 1122 07/14/15 BY L BENFIELD    Culture   Final    STAPHYLOCOCCUS SPECIES (COAGULASE NEGATIVE) SUSCEPTIBILITIES PERFORMED ON PREVIOUS CULTURE WITHIN THE LAST 5 DAYS.    Report Status 07/16/2015 FINAL  Final  Culture, blood (routine x 2)     Status: None   Collection Time: 07/16/15  5:10 AM  Result Value Ref Range Status   Specimen Description BLOOD LEFT HAND  Final   Special Requests IN  PEDIATRIC BOTTLE 2.5CC  Final   Culture  Setup Time   Final    GRAM POSITIVE COCCI IN CLUSTERS AEROBIC BOTTLE ONLY CRITICAL RESULT CALLED TO, READ BACK BY AND VERIFIED WITH: MIKKI MURPHY @0541  07/17/15 MKELLY    Culture   Final    STAPHYLOCOCCUS SPECIES (COAGULASE NEGATIVE) SUSCEPTIBILITIES PERFORMED ON PREVIOUS CULTURE WITHIN THE LAST 5 DAYS.    Report Status 07/18/2015 FINAL  Final  Culture, blood (routine x 2)     Status: None (Preliminary result)   Collection Time: 07/16/15  5:23 AM  Result Value Ref Range Status   Specimen Description BLOOD LEFT HAND  Final   Special Requests IN PEDIATRIC BOTTLE 2CC  Final   Culture NO GROWTH 2 DAYS  Final   Report Status PENDING  Incomplete    Anti-infectives    Start     Dose/Rate Route Frequency Ordered Stop   07/16/15 1000  vancomycin (VANCOCIN) 1,500 mg in sodium chloride 0.9 % 500 mL IVPB  Status:  Discontinued     1,500 mg 250 mL/hr over 120 Minutes Intravenous Every 48 hours 07/14/15 0956 07/15/15 1035   07/16/15 1000  vancomycin (VANCOCIN) IVPB 1000 mg/200 mL premix     1,000 mg 200 mL/hr over 60 Minutes Intravenous Every 48 hours 07/15/15 1035     07/14/15 1500  cefTRIAXone (ROCEPHIN) 1 g in dextrose 5 % 50 mL IVPB  Status:  Discontinued     1 g 100 mL/hr over 30 Minutes Intravenous Every 24 hours 07/13/15 1647 07/17/15 1113   07/14/15 1015  vancomycin (VANCOCIN) 1,500 mg in sodium chloride 0.9 % 500 mL IVPB     1,500 mg 250 mL/hr over 120 Minutes Intravenous NOW 07/14/15 0943 07/14/15 1210   07/13/15 1400  cefTRIAXone (ROCEPHIN) 1 g in dextrose 5 % 50 mL IVPB     1 g 100 mL/hr over 30 Minutes Intravenous  Once 07/13/15 1359 07/13/15 1526      Assessment: [name] on chronic TPN PTA presented to ED with dehydration and ARF.  Since then, patient has been found to have coag neg staph bacteremia and presumed PICC line infection.  Today is day #5 of antibiotic therapy.  Based on renal function, patient is only receiving Vancomycin  q48h and will not be ready for a trough level check until 11/7.  Goal of Therapy:  Vancomycin trough level 15-20 mcg/ml  Plan:  Continue Vancomycin 1gm IV q48h Check trough prior to next dose (11/7) Follow up renal function, culture data, ID recs for LOT.  [employer], Pharm.D., BCPS Clinical Pharmacist Pager [phone] 07/18/2015 1:36 PM

## 2015-07-18 NOTE — Progress Notes (Signed)
Subjective: Pt with depressed mood  Awake but not talking much   Objective: Vital signs in last 24 hours: Temp:  [97.4 F (36.3 C)-98.2 F (36.8 C)] 97.6 F (36.4 C) (11/05 0500) Pulse Rate:  [87-106] 97 (11/05 0500) Resp:  [18-21] 18 (11/05 0500) BP: (95-127)/(46-82) 109/46 mmHg (11/05 0500) SpO2:  [98 %-100 %] 100 % (11/05 0500) Weight:  [97.387 kg (214 lb 11.2 oz)] 97.387 kg (214 lb 11.2 oz) (11/04 2030) Last BM Date: 07/17/15  Intake/Output from previous day: 11/04 0701 - 11/05 0700 In: 2560 [P.O.:60; I.V.:1500; IV Piggyback:1000] Out: 1000 [Urine:900] Intake/Output this shift:    Incision/Wound:Eakins pouch in place   Soft ND   Lab Results:  No results for input(s): WBC, HGB, HCT, PLT in the last 72 hours. BMET  Recent Labs  07/16/15 0430 07/17/15 0424  NA 130* 127*  K 4.0 4.2  CL 108 106  CO2 11* 9*  GLUCOSE 167* 150*  BUN 149* 156*  CREATININE 3.32* 3.32*  CALCIUM 9.4 9.4   PT/INR No results for input(s): LABPROT, INR in the last 72 hours. ABG No results for input(s): PHART, HCO3 in the last 72 hours.  Invalid input(s): PCO2, PO2  Studies/Results: US Renal  07/17/2015  CLINICAL DATA:  Renal insufficiency EXAM: RENAL ULTRASOUND COMPARISON:  Renal ultrasound June 08, 2015; CT abdomen and pelvis July 13, 2015 FINDINGS: Right Kidney: Length: 11.1 cm. Echogenicity and renal cortical thickness are within normal limits. No perinephric fluid or hydronephrosis visualized. There is [name] cyst in the upper pole region measuring 1.5 x 1.5 x 1.5 cm. No sonographically demonstrable calculus or ureterectasis. Left Kidney: Length: 10.8 cm. Echogenicity and renal cortical thickness are within normal limits. No mass, perinephric fluid, or hydronephrosis visualized. No sonographically demonstrable calculus. Bladder: Completely empty and cannot be assessed. IMPRESSION: Cyst arising from upper pole right kidney. Study otherwise unremarkable. Note the urinary bladder is  empty and cannot be assessed. Electronically Signed   By: Lowella Grip III M.D.   On: 07/17/2015 11:45    Anti-infectives: Anti-infectives    Start     Dose/Rate Route Frequency Ordered Stop   07/16/15 1000  vancomycin (VANCOCIN) 1,500 mg in sodium chloride 0.9 % 500 mL IVPB  Status:  Discontinued     1,500 mg 250 mL/hr over 120 Minutes Intravenous Every 48 hours 07/14/15 0956 07/15/15 1035   07/16/15 1000  vancomycin (VANCOCIN) IVPB 1000 mg/200 mL premix     1,000 mg 200 mL/hr over 60 Minutes Intravenous Every 48 hours 07/15/15 1035     07/14/15 1500  cefTRIAXone (ROCEPHIN) 1 g in dextrose 5 % 50 mL IVPB  Status:  Discontinued     1 g 100 mL/hr over 30 Minutes Intravenous Every 24 hours 07/13/15 1647 07/17/15 1113   07/14/15 1015  vancomycin (VANCOCIN) 1,500 mg in sodium chloride 0.9 % 500 mL IVPB     1,500 mg 250 mL/hr over 120 Minutes Intravenous NOW 07/14/15 0943 07/14/15 1210   07/13/15 1400  cefTRIAXone (ROCEPHIN) 1 g in dextrose 5 % 50 mL IVPB     1 g 100 mL/hr over 30 Minutes Intravenous  Once 07/13/15 1359 07/13/15 1526      Assessment/Plan: Patient Active Problem List   Diagnosis Date Noted  . Acute on chronic renal failure ([place]) 07/13/2015  . Metabolic acidosis 32/99/2426  . Urinary tract infection 07/13/2015  . Oral thrush 07/13/2015  . Dehydration 06/04/2015  . Encephalopathy 06/04/2015  . Abdominal pain 06/04/2015  . Acute renal  failure ([place]) 06/04/2015  . Hyponatremia 05/10/2015  . Acute idiopathic gout of left ankle   . Hyperkalemia 05/04/2015  . Type 2 diabetes mellitus without complication ([place])   . D-dimer, elevated 04/16/2015  . Precordial chest pain 04/15/2015  . 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 Abdominal wall abscess 03/13/2015  . Non-compliant behavior 01/22/2015  . Anemia, iron deficiency 12/21/2014  . Diverticulosis of colon with hemorrhage 12/20/2014  . Chronic diastolic CHF (congestive heart failure) ([place]) 06/29/2014  . Routine  general medical examination at [name] 05/30/2014  . Dementia arising in the senium and presenium 03/29/2013  . Acute gouty arthritis 02/15/2012  . Other screening mammogram 02/15/2012  . Osteopenia 02/15/2012  . 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 Hyperlipidemia with target LDL less than 100 10/06/2011  . OSA (obstructive sleep apnea) 04/02/2009  . GERD 08/20/2006  . Gout 08/15/2006  . Essential hypertension 08/15/2006  . CAD S/P OM BMS 8/07, ISR-OM DES 8/09 08/15/2006  . Chronic kidney disease, stage 3 08/15/2006  . DVT, HX OF 08/15/2006    No significant change in fistula output Cont NPO /TNA/sandostatin for now  Eakins pouch in place  ABX /  Supportive care for now    LOS: 5 days    Marissa [name]. 07/18/2015

## 2015-07-18 NOTE — Progress Notes (Signed)
[facility] PROGRESS NOTE  Marissa [name] [id number]:[id number] DOB: [birth year] DOA: 07/13/2015 PCP: [name], MD   Brief narrative 74 y.o. female, with chronic kidney disease, obstructive sleep apnea, diastolic heart failure, type 2 diabetes,   multiple abdominal surgeries, and a recent discharge after care of an enterocutaneous fistula, presented to the emergency department from [facility] with lethargy, 2 days of vomiting and severe dehydration. On 04/28/15 the patient underwent abdominal wound exploration of enterocutaneous fistula and abscess by Dr. Georgette Dover. She was admitted to the hospital on 9/22 with increased ostomy output and confusion. She was treated with TPN and Sandostatin. An eakin pouch was placed. Once she stabilized she was discharged on 10/4 to SNF. Marland Kitchen Per the patient's daughter, Dr. Georgette Dover felt the patient could begin to take oral food & drink approximately 2 weeks ago. The patient's TPN was discontinued at some point after that. The patient ate fairly well for 4-5 days but then decreased her oral intake. One week back patient's enterocutaneous fistula began to drain green bile once again. Her stools became the color of green bile. Yesterday prior to admission patient had several episodes of biliary vomiting. In the emergency department the patient's bicarbonate was 12, her creatinine is elevated to 3.44 from 1.75, her BUN is 160, sodium 129. Lactic acid was normal. UA positive for UTI. Patient admitted to hospitalist service and general surgery following.   Assessment/Plan: Acute on chronic kidney injury with metabolic acidosis prerenal secondary to dehydration. Has underlying chronic kidney disease stage III. Not improved with gentle hydration. Renal ultrasound unremarkable. Appreciate renal consult. Patient started on Ringer's lactate 2 liters bolus followed by maintenance @ 125 mL/ hr. (has received about 2.5 L so far). Renal function further worsened with metabolic  acidosis with hyperkalemia. Discussed with renal and fluid switched to D5 with 3 A of bicarbonate. Monitor urine output closely. Discontinued indomethacin, colchicine. Avoid all nephrotoxic agents. -not a candidate for HD.  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 Maintain nothing by mouth and started TNA. Eakin pounds placed to intermittent wall suction. Wound care following. Continue Sandostatin.  Coag negative staph bacteremia with sepsis 2/2 Seen by ID and recommend one week of vancomycin initially . However repeat blood cultures remained positive suggestive of line infection. Discontinued PICC line on 11/4. Will probably need longer duration of vancomycin. Ordered repeat blood culture.  Proteus UTI Pansensitive. completed  5 days of empiric Rocephin.   Acute encephalopathy Possible secondary to sepsis and dehydration. Appears improved today.  Hyperkalemia Monitor with switching IV fluids.  Hyponatremia Secondary to dehydration. Improving today.  Oral thrush Continue nystatin  Chronic diastolic CHF EF of 56%. Currently hypovolemic and on gentle hydration.  Obstructive sleep apnea Continue bedtime CPAP  Type 2 diabetes mellitus Continue sliding scale insulin  Severe malnutrition with failure to thrive Holding TPN due to persistent bacteremia .     Code Status: DO NOT RESUSCITATE.prognosis is guarded Family Communication: None at bedside. Called daughter Marissa [name] and left a message. Disposition Plan: currently inpatient   Consultants:  CCS  Procedures:  CT abdomen and pelvis  Antibiotics: IV vancomycin 11/1-- and Rocephin 10/31--11/4  HPI/Subjective: Seen and examined. More oriented today. Denies any symptoms.  Objective: Filed Vitals:   07/18/15 1000  BP: 116/49  Pulse: 97  Temp: 97.7 F (36.5 C)  Resp: 17    Intake/Output Summary (Last 24 hours) at 07/18/15  1418 Last data filed at 07/18/15 1000  Gross  per 24 hour  Intake   2560 ml  Output    650 ml  Net   1910 ml   Filed Weights   07/15/15 2018 07/16/15 2000 07/17/15 2030  Weight: 94.5 kg (208 lb 5.4 oz) 95.754 kg (211 lb 1.6 oz) 97.387 kg (214 lb 11.2 oz)    Exam:   General: Female lying in bed not in distress, better oriented today  HEENT: Pallor, moist oral mucosa,   Chest: Clear to auscultation bilaterally  CVS: Normal S1 and S2, no murmurs  GI: Soft, nondistended, nontender, midline wound with fistula, ( eakin pouch)  Musculoskeletal: Warm, no edema  MEQ:ASTMH and oriented.  Data Reviewed: Basic Metabolic Panel:  Recent Labs Lab 07/14/15 0420 07/15/15 0420 07/16/15 0430 07/17/15 0424 07/18/15 0940  NA 128* 130* 130* 127* 133*  K 4.1 4.0 4.0 4.2 5.5*  CL 105 104 108 106 110  CO2 11* 13* 11* 9* 8*  GLUCOSE 160* 174* 167* 150* 126*  BUN 154* 140* 149* 156* 156*  CREATININE 3.50* 3.21* 3.32* 3.32* 3.48*  CALCIUM 9.7 9.7 9.4 9.4 10.0  MG 2.0 2.3 2.1 1.9  --   PHOS 5.0* 5.6* 4.4 3.8  --    Liver Function Tests:  Recent Labs Lab 07/13/15 1335 07/14/15 0420 07/16/15 0430  AST 42* 46* 42*  ALT 52 57* 58*  ALKPHOS 176* 191* 172*  BILITOT 0.9 0.8 0.3  PROT 8.8* 8.1 8.6*  ALBUMIN 3.0* 3.0* 2.7*   No results for input(s): LIPASE, AMYLASE in the last 168 hours. No results for input(s): AMMONIA in the last 168 hours. CBC:  Recent Labs Lab 07/13/15 1335 07/14/15 0420  WBC 8.1 8.5  NEUTROABS 6.3 6.2  HGB 10.7* 10.8*  HCT 32.1* 32.3*  MCV 73.1* 73.1*  PLT 187 190   Cardiac Enzymes:  Recent Labs Lab 07/13/15 1335  TROPONINI <0.03   BNP (last 3 results)  Recent Labs  11/02/14 2340 03/19/15 1325  BNP 109.2* 141.6*    ProBNP (last 3 results) No results for input(s): PROBNP in the last 8760 hours.  CBG:  Recent Labs Lab 07/17/15 1234 07/17/15 1325 07/17/15 1609 07/17/15 2025 07/18/15 0820  GLUCAP 105* 86 106* 99 125*    Recent  Results (from the past 240 hour(s))  Culture, Urine     Status: None   Collection Time: 07/13/15 12:14 PM  Result Value Ref Range Status   Specimen Description URINE, RANDOM  Final   Special Requests ADDED 2258  Final   Culture >=100,000 COLONIES/mL PROTEUS MIRABILIS  Final   Report Status 07/16/2015 FINAL  Final   Organism ID, Bacteria PROTEUS MIRABILIS  Final      Susceptibility   Proteus mirabilis - MIC*    AMPICILLIN <=2 SENSITIVE Sensitive     CEFAZOLIN <=4 SENSITIVE Sensitive     CEFTRIAXONE <=1 SENSITIVE Sensitive     CIPROFLOXACIN <=0.25 SENSITIVE Sensitive     GENTAMICIN <=1 SENSITIVE Sensitive     IMIPENEM 4 SENSITIVE Sensitive     NITROFURANTOIN 128 RESISTANT Resistant     TRIMETH/SULFA <=20 SENSITIVE Sensitive     AMPICILLIN/SULBACTAM <=2 SENSITIVE Sensitive     PIP/TAZO <=4 SENSITIVE Sensitive     * >=100,000 COLONIES/mL PROTEUS MIRABILIS  Blood culture (routine x 2)     Status: None   Collection Time: 07/13/15  2:50 PM  Result Value Ref Range Status   Specimen Description BLOOD PICC LINE  Final   Special Requests BOTTLES DRAWN AEROBIC AND ANAEROBIC 5ML  Final   Culture  Setup Time   Final    GRAM POSITIVE COCCI IN CLUSTERS IN BOTH AEROBIC AND ANAEROBIC BOTTLES CRITICAL RESULT CALLED TO, READ BACK BY AND VERIFIED WITH: TO BQIU(RN) BY TCLEVELAND 07/14/2015 AT 5:58 AM    Culture STAPHYLOCOCCUS SPECIES (COAGULASE NEGATIVE)  Final   Report Status 07/16/2015 FINAL  Final   Organism ID, Bacteria STAPHYLOCOCCUS SPECIES (COAGULASE NEGATIVE)  Final      Susceptibility   Staphylococcus species (coagulase negative) - MIC*    CIPROFLOXACIN <=0.5 SENSITIVE Sensitive     ERYTHROMYCIN <=0.25 SENSITIVE Sensitive     GENTAMICIN <=0.5 SENSITIVE Sensitive     OXACILLIN >=4 RESISTANT Resistant     TETRACYCLINE 2 SENSITIVE Sensitive     VANCOMYCIN 2 SENSITIVE Sensitive     TRIMETH/SULFA <=10 SENSITIVE Sensitive     CLINDAMYCIN >=8 RESISTANT Resistant     RIFAMPIN <=0.5 SENSITIVE  Sensitive     Inducible Clindamycin NEGATIVE Sensitive     * STAPHYLOCOCCUS SPECIES (COAGULASE NEGATIVE)  Blood culture (routine x 2)     Status: None   Collection Time: 07/13/15  2:55 PM  Result Value Ref Range Status   Specimen Description BLOOD LEFT HAND  Final   Special Requests   Final    BOTTLES DRAWN AEROBIC AND ANAEROBIC BLUE 5CC, RED 3CC   Culture  Setup Time   Final    GRAM POSITIVE COCCI IN CLUSTERS AEROBIC BOTTLE ONLY CRITICAL RESULT CALLED TO, READ BACK BY AND VERIFIED WITH: J NARAMDAS,RN AT 1122 07/14/15 BY L BENFIELD    Culture   Final    STAPHYLOCOCCUS SPECIES (COAGULASE NEGATIVE) SUSCEPTIBILITIES PERFORMED ON PREVIOUS CULTURE WITHIN THE LAST 5 DAYS.    Report Status 07/16/2015 FINAL  Final  Culture, blood (routine x 2)     Status: None   Collection Time: 07/16/15  5:10 AM  Result Value Ref Range Status   Specimen Description BLOOD LEFT HAND  Final   Special Requests IN PEDIATRIC BOTTLE 2.5CC  Final   Culture  Setup Time   Final    GRAM POSITIVE COCCI IN CLUSTERS AEROBIC BOTTLE ONLY CRITICAL RESULT CALLED TO, READ BACK BY AND VERIFIED WITH: MIKKI MURPHY @0541  07/17/15 MKELLY    Culture   Final    STAPHYLOCOCCUS SPECIES (COAGULASE NEGATIVE) SUSCEPTIBILITIES PERFORMED ON PREVIOUS CULTURE WITHIN THE LAST 5 DAYS.    Report Status 07/18/2015 FINAL  Final  Culture, blood (routine x 2)     Status: None (Preliminary result)   Collection Time: 07/16/15  5:23 AM  Result Value Ref Range Status   Specimen Description BLOOD LEFT HAND  Final   Special Requests IN PEDIATRIC BOTTLE 2CC  Final   Culture NO GROWTH 2 DAYS  Final   Report Status PENDING  Incomplete     Studies: US Renal  07/17/2015  CLINICAL DATA:  Renal insufficiency EXAM: RENAL ULTRASOUND COMPARISON:  Renal ultrasound June 08, 2015; CT abdomen and pelvis July 13, 2015 FINDINGS: Right Kidney: Length: 11.1 cm. Echogenicity and renal cortical thickness are within normal limits. No perinephric fluid or  hydronephrosis visualized. There is a cyst in the upper pole region measuring 1.5 x 1.5 x 1.5 cm. No sonographically demonstrable calculus or ureterectasis. Left Kidney: Length: 10.8 cm. Echogenicity and renal cortical thickness are within normal limits. No mass, perinephric fluid, or hydronephrosis visualized. No sonographically demonstrable calculus. Bladder: Completely empty and cannot be assessed. IMPRESSION: Cyst arising from upper pole right kidney. Study otherwise unremarkable. Note the urinary bladder is empty  and cannot be assessed. Electronically Signed   By: Lowella Grip III M.D.   On: 07/17/2015 11:45    Scheduled Meds: . heparin  5,000 Units Subcutaneous 3 times per day  . insulin aspart  0-9 Units Subcutaneous 4 times per day  . nystatin  5 mL Oral QID  . octreotide  100 mcg Subcutaneous Q12H  . pantoprazole (PROTONIX) IV  40 mg Intravenous Q24H  . vancomycin  1,000 mg Intravenous Q48H   Continuous Infusions: . dextrose 5 % 1,000 mL with sodium bicarbonate 150 mEq infusion 125 mL/hr at 07/18/15 1140      Time spent: [place], Zayden Hahne  [facility] Pager [phone] If 7PM-7AM, please contact night-coverage at [email or web address], password [facility] 07/18/2015, 2:18 PM  LOS: 5 days

## 2015-07-19 DIAGNOSIS — B9561 Methicillin susceptible Staphylococcus aureus infection as the cause of diseases classified elsewhere: Secondary | ICD-10-CM

## 2015-07-19 LAB — GLUCOSE, CAPILLARY
GLUCOSE-CAPILLARY: 129 mg/dL — AB (ref 65–99)
GLUCOSE-CAPILLARY: 128 mg/dL — AB (ref 65–99)
GLUCOSE-CAPILLARY: 144 mg/dL — AB (ref 65–99)
GLUCOSE-CAPILLARY: 147 mg/dL — AB (ref 65–99)

## 2015-07-19 LAB — BASIC METABOLIC PANEL
ANION GAP: 16 — AB (ref 5–15)
BUN: 148 mg/dL — ABNORMAL HIGH (ref 6–20)
CALCIUM: 9.6 mg/dL (ref 8.9–10.3)
CO2: 18 mmol/L — ABNORMAL LOW (ref 22–32)
Chloride: 104 mmol/L (ref 101–111)
Creatinine, Ser: 3.38 mg/dL — ABNORMAL HIGH (ref 0.44–1.00)
GFR, EST AFRICAN AMERICAN: 14 mL/min — AB (ref >60)
GFR, EST NON AFRICAN AMERICAN: 12 mL/min — AB (ref >60)
Glucose, Bld: 135 mg/dL — ABNORMAL HIGH (ref 65–99)
Potassium: 3.4 mmol/L — ABNORMAL LOW (ref 3.5–5.1)
Sodium: 138 mmol/L (ref 135–145)

## 2015-07-19 LAB — CBC
HCT: 30.6 % — ABNORMAL LOW (ref 36.0–46.0)
HEMOGLOBIN: 10.3 g/dL — AB (ref 12.0–15.0)
MCH: 24.2 pg — ABNORMAL LOW (ref 26.0–34.0)
MCHC: 33.7 g/dL (ref 30.0–36.0)
MCV: 71.8 fL — ABNORMAL LOW (ref 78.0–100.0)
Platelets: 251 10*3/uL (ref 150–400)
RBC: 4.26 MIL/uL (ref 3.87–5.11)
RDW: 15.3 % (ref 11.5–15.5)
WBC: 7.1 10*3/uL (ref 4.0–10.5)

## 2015-07-19 NOTE — Progress Notes (Signed)
[facility] PROGRESS NOTE  Marissa [name] EXH:[id number] DOB: 08/02/1941 DOA: 07/13/2015 PCP: [name], MD   Brief narrative 74 y.o. female, with chronic kidney disease, obstructive sleep apnea, diastolic heart failure, type 2 diabetes,   multiple abdominal surgeries, and a recent discharge after care of an enterocutaneous fistula, presented to the emergency department from [facility] with lethargy, 2 days of vomiting and severe dehydration. On 04/28/15 the patient underwent abdominal wound exploration of enterocutaneous fistula and abscess by Dr. Georgette Dover. She was admitted to the hospital on 9/22 with increased ostomy output and confusion. She was treated with TPN and Sandostatin. An eakin pouch was placed. Once she stabilized she was discharged on 10/4 to SNF. Marland Kitchen Per the patient's daughter, Dr. Georgette Dover felt the patient could begin to take oral food & drink approximately 2 weeks ago. The patient's TPN was discontinued at some point after that. The patient ate fairly well for 4-5 days but then decreased her oral intake. One week back patient's enterocutaneous fistula began to drain green bile once again. Her stools became the color of green bile. Yesterday prior to admission patient had several episodes of biliary vomiting. In the emergency department the patient's bicarbonate was 12, her creatinine is elevated to 3.44 from 1.75, her BUN is 160, sodium 129. Lactic acid was normal. UA positive for UTI. Patient admitted to hospitalist service and general surgery following.   Assessment/Plan: Acute on chronic kidney injury with metabolic acidosis prerenal secondary to dehydration. Has underlying chronic kidney disease stage III. Not improved with gentle hydration. Renal ultrasound unremarkable. Appreciate renal consult. Patient started on Ringer's lactate 2 liters bolus followed by maintenance  Renal function further worsened with metabolic acidosis with hyperkalemia. Discussed with renal  and fluid switched to D5 with bicarbonate. Monitor urine output closely. Bicarb improving. Discontinued indomethacin, colchicine. Avoid all nephrotoxic agents. -not a candidate for HD. 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 Maintain nothing by mouth and started TNA. Eakin pounds placed to intermittent wall suction. Wound care following. Continue Sandostatin.  Coag negative staph bacteremia with sepsis 2/2 Seen by ID and recommend one week of vancomycin initially . However repeat blood cultures remained positive suggestive of line infection. Discontinued PICC line on 11/4. Will probably need longer duration of vancomycin. Ordered repeat blood culture. If blood cx negative, will order PICC tomorrow.  Poor IV access, IV team to attempt peripheral access. i have spoken with PCCM for central line placement if unable.  Proteus UTI Pansensitive. completed  5 days of empiric Rocephin.   Acute encephalopathy Possible secondary to sepsis and dehydration. Appears improved but has flattened affect.  Hyperkalemia Monitor with switching IV fluids.  Hyponatremia Secondary to dehydration. Improving today.  Oral thrush Continue nystatin  Chronic diastolic CHF EF of 38%. Currently hypovolemic and on gentle hydration.  Obstructive sleep apnea Continue bedtime CPAP  Type 2 diabetes mellitus Continue sliding scale insulin  Severe malnutrition with failure to thrive Holding TPN due to persistent bacteremia .     Code Status: DO NOT RESUSCITATE.prognosis is guarded Family Communication: None at bedside. Will call daughter again today. Disposition Plan: currently inpatient   Consultants:  CCS  Procedures:  CT abdomen and pelvis  Antibiotics: IV vancomycin 11/1-- and Rocephin 10/31--11/4  HPI/Subjective: Seen and examined. Has lost IV. No overnight issues  Objective: Filed Vitals:    07/19/15 0809  BP: 115/65  Pulse: 100  Temp: 98.1 F (36.7 C)  Resp:  18    Intake/Output Summary (Last 24 hours) at 07/19/15 1320 Last data filed at 07/19/15 1000  Gross per 24 hour  Intake 1112.5 ml  Output   1200 ml  Net  -87.5 ml   Filed Weights   07/16/15 2000 07/17/15 2030 07/18/15 2001  Weight: 95.754 kg (211 lb 1.6 oz) 97.387 kg (214 lb 11.2 oz) 98.3 kg (216 lb 11.4 oz)    Exam:   General: Female lying in bed not in distress flat affect  HEENT: Pallor, moist oral mucosa   Chest: Clear to auscultation bilaterally  CVS: Normal S1 and S2, no murmurs  GI: Soft, nondistended, nontender, midline wound with fistula, (eakin pouch)  Musculoskeletal: Warm, no edema  CNS: alert and awake. Appears depressed  Data Reviewed: Basic Metabolic Panel:  Recent Labs Lab 07/14/15 0420 07/15/15 0420 07/16/15 0430 07/17/15 0424 07/18/15 0940 07/19/15 0415  NA 128* 130* 130* 127* 133* 138  K 4.1 4.0 4.0 4.2 5.5* 3.4*  CL 105 104 108 106 110 104  CO2 11* 13* 11* 9* 8* 18*  GLUCOSE 160* 174* 167* 150* 126* 135*  BUN 154* 140* 149* 156* 156* 148*  CREATININE 3.50* 3.21* 3.32* 3.32* 3.48* 3.38*  CALCIUM 9.7 9.7 9.4 9.4 10.0 9.6  MG 2.0 2.3 2.1 1.9  --   --   PHOS 5.0* 5.6* 4.4 3.8  --   --    Liver Function Tests:  Recent Labs Lab 07/13/15 1335 07/14/15 0420 07/16/15 0430  AST 42* 46* 42*  ALT 52 57* 58*  ALKPHOS 176* 191* 172*  BILITOT 0.9 0.8 0.3  PROT 8.8* 8.1 8.6*  ALBUMIN 3.0* 3.0* 2.7*   No results for input(s): LIPASE, AMYLASE in the last 168 hours. No results for input(s): AMMONIA in the last 168 hours. CBC:  Recent Labs Lab 07/13/15 1335 07/14/15 0420 07/19/15 0415  WBC 8.1 8.5 7.1  NEUTROABS 6.3 6.2  --   HGB 10.7* 10.8* 10.3*  HCT 32.1* 32.3* 30.6*  MCV 73.1* 73.1* 71.8*  PLT 187 190 251   Cardiac Enzymes:  Recent Labs Lab 07/13/15 1335  TROPONINI <0.03   BNP (last 3 results)  Recent Labs  11/02/14 2340 03/19/15 1325  BNP  109.2* 141.6*    ProBNP (last 3 results) No results for input(s): PROBNP in the last 8760 hours.  CBG:  Recent Labs Lab 07/18/15 1259 07/18/15 1706 07/18/15 2001 07/19/15 0724 07/19/15 1137  GLUCAP 154* 145* 137* 129* 128*    Recent Results (from the past 240 hour(s))  Culture, Urine     Status: None   Collection Time: 07/13/15 12:14 PM  Result Value Ref Range Status   Specimen Description URINE, RANDOM  Final   Special Requests ADDED 2258  Final   Culture >=100,000 COLONIES/mL PROTEUS MIRABILIS  Final   Report Status 07/16/2015 FINAL  Final   Organism ID, Bacteria PROTEUS MIRABILIS  Final      Susceptibility   Proteus mirabilis - MIC*    AMPICILLIN <=2 SENSITIVE Sensitive     CEFAZOLIN <=4 SENSITIVE Sensitive     CEFTRIAXONE <=1 SENSITIVE Sensitive     CIPROFLOXACIN <=0.25 SENSITIVE Sensitive     GENTAMICIN <=1 SENSITIVE Sensitive     IMIPENEM 4 SENSITIVE Sensitive     NITROFURANTOIN 128 RESISTANT Resistant     TRIMETH/SULFA <=20 SENSITIVE Sensitive     AMPICILLIN/SULBACTAM <=2 SENSITIVE Sensitive     PIP/TAZO <=4 SENSITIVE Sensitive     * >=100,000 COLONIES/mL PROTEUS MIRABILIS  Blood culture (  routine x 2)     Status: None   Collection Time: 07/13/15  2:50 PM  Result Value Ref Range Status   Specimen Description BLOOD PICC LINE  Final   Special Requests BOTTLES DRAWN AEROBIC AND ANAEROBIC 5ML  Final   Culture  Setup Time   Final    GRAM POSITIVE COCCI IN CLUSTERS IN BOTH AEROBIC AND ANAEROBIC BOTTLES CRITICAL RESULT CALLED TO, READ BACK BY AND VERIFIED WITH: TO BQIU(RN) BY TCLEVELAND 07/14/2015 AT 5:58 AM    Culture STAPHYLOCOCCUS SPECIES (COAGULASE NEGATIVE)  Final   Report Status 07/16/2015 FINAL  Final   Organism ID, Bacteria STAPHYLOCOCCUS SPECIES (COAGULASE NEGATIVE)  Final      Susceptibility   Staphylococcus species (coagulase negative) - MIC*    CIPROFLOXACIN <=0.5 SENSITIVE Sensitive     ERYTHROMYCIN <=0.25 SENSITIVE Sensitive     GENTAMICIN <=0.5  SENSITIVE Sensitive     OXACILLIN >=4 RESISTANT Resistant     TETRACYCLINE 2 SENSITIVE Sensitive     VANCOMYCIN 2 SENSITIVE Sensitive     TRIMETH/SULFA <=10 SENSITIVE Sensitive     CLINDAMYCIN >=8 RESISTANT Resistant     RIFAMPIN <=0.5 SENSITIVE Sensitive     Inducible Clindamycin NEGATIVE Sensitive     * STAPHYLOCOCCUS SPECIES (COAGULASE NEGATIVE)  Blood culture (routine x 2)     Status: None   Collection Time: 07/13/15  2:55 PM  Result Value Ref Range Status   Specimen Description BLOOD LEFT HAND  Final   Special Requests   Final    BOTTLES DRAWN AEROBIC AND ANAEROBIC BLUE 5CC, RED 3CC   Culture  Setup Time   Final    GRAM POSITIVE COCCI IN CLUSTERS AEROBIC BOTTLE ONLY CRITICAL RESULT CALLED TO, READ BACK BY AND VERIFIED WITH: J NARAMDAS,RN AT 1122 07/14/15 BY L BENFIELD    Culture   Final    STAPHYLOCOCCUS SPECIES (COAGULASE NEGATIVE) SUSCEPTIBILITIES PERFORMED ON PREVIOUS CULTURE WITHIN THE LAST 5 DAYS.    Report Status 07/16/2015 FINAL  Final  Culture, blood (routine x 2)     Status: None   Collection Time: 07/16/15  5:10 AM  Result Value Ref Range Status   Specimen Description BLOOD LEFT HAND  Final   Special Requests IN PEDIATRIC BOTTLE 2.5CC  Final   Culture  Setup Time   Final    GRAM POSITIVE COCCI IN CLUSTERS AEROBIC BOTTLE ONLY CRITICAL RESULT CALLED TO, READ BACK BY AND VERIFIED WITH: MIKKI MURPHY @0541  07/17/15 MKELLY    Culture   Final    STAPHYLOCOCCUS SPECIES (COAGULASE NEGATIVE) SUSCEPTIBILITIES PERFORMED ON PREVIOUS CULTURE WITHIN THE LAST 5 DAYS.    Report Status 07/18/2015 FINAL  Final  Culture, blood (routine x 2)     Status: None (Preliminary result)   Collection Time: 07/16/15  5:23 AM  Result Value Ref Range Status   Specimen Description BLOOD LEFT HAND  Final   Special Requests IN PEDIATRIC BOTTLE 2CC  Final   Culture NO GROWTH 3 DAYS  Final   Report Status PENDING  Incomplete     Studies: No results found.  Scheduled Meds: . heparin   5,000 Units Subcutaneous 3 times per day  . insulin aspart  0-9 Units Subcutaneous 4 times per day  . nystatin  5 mL Oral QID  . octreotide  100 mcg Subcutaneous Q12H  . pantoprazole (PROTONIX) IV  40 mg Intravenous Q24H  . vancomycin  1,000 mg Intravenous Q48H   Continuous Infusions: . dextrose 5 % 1,000 mL with sodium bicarbonate 150  mEq infusion Stopped (07/19/15 0254)      Time spent: [place], [facility] Pager [phone] If 7PM-7AM, please contact night-coverage at [email or web address], password [facility] 07/19/2015, 1:20 PM  LOS: 6 days

## 2015-07-19 NOTE — Progress Notes (Signed)
Admit: 07/13/2015 LOS: 6  56F CKD3-4 admit with AoCKD, hypovolemia, chronic EC fistula with high output and TPN dependence found to have proteus UTI and CNS bacteremia with chronic PICC.   Subjective:  Lost PIV again, not much IV intake, NPO Small improvement in BUN and SCr overnight Daughter at bedside updated  11/05 0701 - 11/06 0700 In: 1112.5 [I.V.:1112.5] Out: 400 [Urine:100]  Filed Weights   07/16/15 2000 07/17/15 2030 07/18/15 2001  Weight: 95.754 kg (211 lb 1.6 oz) 97.387 kg (214 lb 11.2 oz) 98.3 kg (216 lb 11.4 oz)    Scheduled Meds: . heparin  5,000 Units Subcutaneous 3 times per day  . insulin aspart  0-9 Units Subcutaneous 4 times per day  . nystatin  5 mL Oral QID  . octreotide  100 mcg Subcutaneous Q12H  . pantoprazole (PROTONIX) IV  40 mg Intravenous Q24H  . vancomycin  1,000 mg Intravenous Q48H   Continuous Infusions: . dextrose 5 % 1,000 mL with sodium bicarbonate 150 mEq infusion Stopped (07/19/15 0254)   PRN Meds:.diphenhydrAMINE, Glycerin (Adult), morphine injection, nitroGLYCERIN, ondansetron **OR** ondansetron (ZOFRAN) IV, oxyCODONE, sodium chloride  Current Labs: reviewed    Physical Exam:  Blood pressure 115/65, pulse 100, temperature 98.1 F (36.7 C), temperature source Oral, resp. rate 18, height 5\' 4"  (1.626 m), weight 98.3 kg (216 lb 11.4 oz), SpO2 100 %. Not very interactive, eyes open, tracts Asterixus mildly improved from yesterday RRR, no rub Eakins pouch noted  A/P 1. AoCKD with profound azotemia 1. Suspecte hypovolemia + NSAID (was about 7kg below recent discharge weight) 2. Baseline SCr = 1.7s 3. Renal US 11/4 w/o obstruction 4. Cont hydration with D5W + NaHCO3 once IV access restored -- d/w TRH, TLC might be best option here Daily weights, Daily Renal Panel, Strict I/Os, Avoid nephrotoxins (NSAIDs, judicious IV Contrast) 1. Chrnoic enterocutaneous fistula, intermittent high outpt, TPN dependency -- surgery following 2. NAG  metabolic acidosis: likely related to GI losses; follow with hydration  3. AMS: likely multifactorial including uremia, cont hydration and follow; no RRT needed as very possible that IVFs will help; not a candidate for long term HD 4. GPC bacteremia and Proteus UTI 1. PICC out 2. ABX per primary 5. FTT -- goals of care / palliative care seem reasonable to explore at this time  Pearson Grippe MD 07/19/2015, 8:19 AM   Recent Labs Lab 07/15/15 0420 07/16/15 0430 07/17/15 0424 07/18/15 0940 07/19/15 0415  NA 130* 130* 127* 133* 138  K 4.0 4.0 4.2 5.5* 3.4*  CL 104 108 106 110 104  CO2 13* 11* 9* 8* 18*  GLUCOSE 174* 167* 150* 126* 135*  BUN 140* 149* 156* 156* 148*  CREATININE 3.21* 3.32* 3.32* 3.48* 3.38*  CALCIUM 9.7 9.4 9.4 10.0 9.6  PHOS 5.6* 4.4 3.8  --   --     Recent Labs Lab 07/13/15 1335 07/14/15 0420 07/19/15 0415  WBC 8.1 8.5 7.1  NEUTROABS 6.3 6.2  --   HGB 10.7* 10.8* 10.3*  HCT 32.1* 32.3* 30.6*  MCV 73.1* 73.1* 71.8*  PLT 187 190 251

## 2015-07-19 NOTE — Progress Notes (Signed)
Patient refuses CPAP for tonight.  

## 2015-07-19 NOTE — Progress Notes (Signed)
Patient had ventricular bigeminy but is asymptomatic. Paged MD and will continue to monitor. Marissa [name], [place] 07/19/2015 2224

## 2015-07-19 NOTE — Progress Notes (Signed)
Subjective: No measured fistula output last 24 hours, although obvious enteric drainage in canister New care order written emphasizing need for accurate daily output record  Seems calm but depressed this morning.  Talks a little. Denies pain or nausea  Objective: Vital signs in last 24 hours: Temp:  [97.5 F (36.4 C)-98.2 F (36.8 C)] 98.2 F (36.8 C) (11/06 0440) Pulse Rate:  [94-97] 97 (11/06 0440) Resp:  [17-19] 19 (11/06 0440) BP: (112-124)/(49-87) 124/87 mmHg (11/06 0440) SpO2:  [100 %] 100 % (11/06 0440) Weight:  [98.3 kg (216 lb 11.4 oz)] 98.3 kg (216 lb 11.4 oz) (11/05 2001) Last BM Date: 07/18/15  Intake/Output from previous day: 11/05 0701 - 11/06 0700 In: 0  Out: 100 [Urine:100] Intake/Output this shift:    Exam: Alert.  Doesn't appear to be in distress Abdomen: Soft.  Not distended.  eakin's pouch in place with good seal.  Red rubber catheter draining bilious fluid.  Lab Results:   Recent Labs  07/19/15 0415  WBC 7.1  HGB 10.3*  HCT 30.6*  PLT 251   BMET  Recent Labs  07/18/15 0940 07/19/15 0415  NA 133* 138  K 5.5* 3.4*  CL 110 104  CO2 8* 18*  GLUCOSE 126* 135*  BUN 156* 148*  CREATININE 3.48* 3.38*  CALCIUM 10.0 9.6   PT/INR No results for input(s): LABPROT, INR in the last 72 hours. ABG No results for input(s): PHART, HCO3 in the last 72 hours.  Invalid input(s): PCO2, PO2  Studies/Results: US Renal  07/17/2015  CLINICAL DATA:  Renal insufficiency EXAM: RENAL ULTRASOUND COMPARISON:  Renal ultrasound June 08, 2015; CT abdomen and pelvis July 13, 2015 FINDINGS: Right Kidney: Length: 11.1 cm. Echogenicity and renal cortical thickness are within normal limits. No perinephric fluid or hydronephrosis visualized. There is a cyst in the upper pole region measuring 1.5 x 1.5 x 1.5 cm. No sonographically demonstrable calculus or ureterectasis. Left Kidney: Length: 10.8 cm. Echogenicity and renal cortical thickness are within normal  limits. No mass, perinephric fluid, or hydronephrosis visualized. No sonographically demonstrable calculus. Bladder: Completely empty and cannot be assessed. IMPRESSION: Cyst arising from upper pole right kidney. Study otherwise unremarkable. Note the urinary bladder is empty and cannot be assessed. Electronically Signed   By: Lowella Grip III M.D.   On: 07/17/2015 11:45    Anti-infectives: Anti-infectives    Start     Dose/Rate Route Frequency Ordered Stop   07/16/15 1000  vancomycin (VANCOCIN) 1,500 mg in sodium chloride 0.9 % 500 mL IVPB  Status:  Discontinued     1,500 mg 250 mL/hr over 120 Minutes Intravenous Every 48 hours 07/14/15 0956 07/15/15 1035   07/16/15 1000  vancomycin (VANCOCIN) IVPB 1000 mg/200 mL premix     1,000 mg 200 mL/hr over 60 Minutes Intravenous Every 48 hours 07/15/15 1035     07/14/15 1500  cefTRIAXone (ROCEPHIN) 1 g in dextrose 5 % 50 mL IVPB  Status:  Discontinued     1 g 100 mL/hr over 30 Minutes Intravenous Every 24 hours 07/13/15 1647 07/17/15 1113   07/14/15 1015  vancomycin (VANCOCIN) 1,500 mg in sodium chloride 0.9 % 500 mL IVPB     1,500 mg 250 mL/hr over 120 Minutes Intravenous NOW 07/14/15 0943 07/14/15 1210   07/13/15 1400  cefTRIAXone (ROCEPHIN) 1 g in dextrose 5 % 50 mL IVPB     1 g 100 mL/hr over 30 Minutes Intravenous  Once 07/13/15 1359 07/13/15 1526      Assessment/Plan:  Patient Active Problem List   Diagnosis Date Noted  . Acute on chronic renal failure ([place]) 07/13/2015  . Metabolic acidosis 70/92/9574  . Urinary tract infection 07/13/2015  . Oral thrush 07/13/2015  . Dehydration 06/04/2015  . Encephalopathy 06/04/2015  . Abdominal pain 06/04/2015  . Acute renal failure ([place]) 06/04/2015  . Hyponatremia 05/10/2015  . Acute idiopathic gout of left ankle   . Hyperkalemia 05/04/2015  . Type 2 diabetes mellitus without complication ([place])   . D-dimer, elevated 04/16/2015  . Precordial  chest pain 04/15/2015  . 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 Abdominal wall abscess 03/13/2015  . Non-compliant behavior 01/22/2015  . Anemia, iron deficiency 12/21/2014  . Diverticulosis of colon with hemorrhage 12/20/2014  . Chronic diastolic CHF (congestive heart failure) ([place]) 06/29/2014  . Routine general medical examination at a health care facility 05/30/2014  . Dementia arising in the senium and presenium 03/29/2013  . Acute gouty arthritis 02/15/2012  . Other screening mammogram 02/15/2012  . Osteopenia 02/15/2012  . 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 Hyperlipidemia with target LDL less than 100 10/06/2011  . OSA (obstructive sleep apnea) 04/02/2009  . GERD 08/20/2006  . Gout 08/15/2006  . Essential hypertension 08/15/2006  . CAD S/P OM BMS 8/07, ISR-OM DES 8/09 08/15/2006  . Chronic kidney disease, stage 3 08/15/2006  . DVT, HX OF 08/15/2006    No significant change in fistula output. Needs better record, again.. Cont NPO /TNA/sandostatin for now  Eakins pouch in place  ABX / Supportive care for now  Needs to get back on TNA as soon as possible  IV access continues to be an issue.          LOS: 6 days    [name] M 07/19/2015

## 2015-07-20 ENCOUNTER — Inpatient Hospital Stay (HOSPITAL_COMMUNITY): Payer: Medicare Other

## 2015-07-20 DIAGNOSIS — E876 Hypokalemia: Secondary | ICD-10-CM

## 2015-07-20 LAB — COMPREHENSIVE METABOLIC PANEL
ALT: 135 U/L — ABNORMAL HIGH (ref 14–54)
ANION GAP: 14 (ref 5–15)
AST: 99 U/L — ABNORMAL HIGH (ref 15–41)
Albumin: 2.8 g/dL — ABNORMAL LOW (ref 3.5–5.0)
Alkaline Phosphatase: 174 U/L — ABNORMAL HIGH (ref 38–126)
BUN: 138 mg/dL — AB (ref 6–20)
CALCIUM: 9.3 mg/dL (ref 8.9–10.3)
CHLORIDE: 98 mmol/L — AB (ref 101–111)
CO2: 33 mmol/L — AB (ref 22–32)
CREATININE: 3.7 mg/dL — AB (ref 0.44–1.00)
GFR, EST AFRICAN AMERICAN: 13 mL/min — AB (ref >60)
GFR, EST NON AFRICAN AMERICAN: 11 mL/min — AB (ref >60)
Glucose, Bld: 164 mg/dL — ABNORMAL HIGH (ref 65–99)
Potassium: 2.9 mmol/L — ABNORMAL LOW (ref 3.5–5.1)
SODIUM: 145 mmol/L (ref 135–145)
Total Bilirubin: 0.9 mg/dL (ref 0.3–1.2)
Total Protein: 7.6 g/dL (ref 6.5–8.1)

## 2015-07-20 LAB — CBC
HEMATOCRIT: 28.6 % — AB (ref 36.0–46.0)
HEMOGLOBIN: 9.8 g/dL — AB (ref 12.0–15.0)
MCH: 24.2 pg — ABNORMAL LOW (ref 26.0–34.0)
MCHC: 34.3 g/dL (ref 30.0–36.0)
MCV: 70.6 fL — ABNORMAL LOW (ref 78.0–100.0)
Platelets: 242 10*3/uL (ref 150–400)
RBC: 4.05 MIL/uL (ref 3.87–5.11)
RDW: 14.6 % (ref 11.5–15.5)
WBC: 7.9 10*3/uL (ref 4.0–10.5)

## 2015-07-20 LAB — DIFFERENTIAL
BASOS ABS: 0.1 10*3/uL (ref 0.0–0.1)
Basophils Relative: 1 %
EOS PCT: 6 %
Eosinophils Absolute: 0.5 10*3/uL (ref 0.0–0.7)
Lymphocytes Relative: 13 %
Lymphs Abs: 1 10*3/uL (ref 0.7–4.0)
MONO ABS: 0.9 10*3/uL (ref 0.1–1.0)
MONOS PCT: 12 %
Neutro Abs: 5.4 10*3/uL (ref 1.7–7.7)
Neutrophils Relative %: 68 %

## 2015-07-20 LAB — GLUCOSE, CAPILLARY
GLUCOSE-CAPILLARY: 151 mg/dL — AB (ref 65–99)
GLUCOSE-CAPILLARY: 146 mg/dL — AB (ref 65–99)
Glucose-Capillary: 130 mg/dL — ABNORMAL HIGH (ref 65–99)
Glucose-Capillary: 122 mg/dL — ABNORMAL HIGH (ref 65–99)

## 2015-07-20 LAB — VANCOMYCIN, TROUGH: VANCOMYCIN TR: 35 ug/mL — AB (ref 10.0–20.0)

## 2015-07-20 LAB — MAGNESIUM: Magnesium: 2.1 mg/dL (ref 1.7–2.4)

## 2015-07-20 LAB — PREALBUMIN: Prealbumin: 35.1 mg/dL (ref 18–38)

## 2015-07-20 LAB — PHOSPHORUS: PHOSPHORUS: 3.6 mg/dL (ref 2.5–4.6)

## 2015-07-20 LAB — TRIGLYCERIDES: TRIGLYCERIDES: 84 mg/dL (ref <150)

## 2015-07-20 MED ORDER — IOHEXOL 300 MG/ML  SOLN
300.0000 mL | Freq: Once | INTRAMUSCULAR | Status: DC | PRN
Start: 2015-07-20 — End: 2015-08-04
  Administered 2015-07-20: 150 mL via ORAL
  Filled 2015-07-20: qty 300

## 2015-07-20 MED ORDER — POTASSIUM CHLORIDE 10 MEQ/100ML IV SOLN
10.0000 meq | INTRAVENOUS | Status: AC
  Administered 2015-07-20 (×4): 10 meq via INTRAVENOUS
  Filled 2015-07-20 (×4): qty 100

## 2015-07-20 MED ORDER — SODIUM CHLORIDE 0.9 % IV BOLUS (SEPSIS)
1000.0000 mL | Freq: Once | INTRAVENOUS | Status: AC
  Administered 2015-07-20: 1000 mL via INTRAVENOUS

## 2015-07-20 MED ORDER — DIATRIZOATE MEGLUMINE 30 % UR SOLN: Freq: Once | URETHRAL | Status: DC | PRN

## 2015-07-20 MED ORDER — MORPHINE SULFATE (PF) 2 MG/ML IV SOLN
INTRAVENOUS | Status: AC
  Filled 2015-07-20: qty 1

## 2015-07-20 MED ORDER — LOPERAMIDE HCL 2 MG PO CAPS
2.0000 mg | ORAL_CAPSULE | Freq: Two times a day (BID) | ORAL | Status: DC
  Administered 2015-07-20 – 2015-07-21 (×3): 2 mg via ORAL
  Filled 2015-07-20 (×5): qty 1

## 2015-07-20 NOTE — Progress Notes (Signed)
Patient ID: Marissa [name], female   DOB: 11-06-1940, 74 y.o.   MRN: [id number]    Subjective: Pt not talking much this morning.  RN confirmed 1150cc was accurate amount from [facility] fistula yesterday.  Already had 200cc this am in 2 hours  Objective: Vital signs in last 24 hours: Temp:  [97.6 F (36.4 C)-98.5 F (36.9 C)] 98 F (36.7 C) (11/07 0815) Pulse Rate:  [62-99] 62 (11/07 0815) Resp:  [17-19] 18 (11/07 0815) BP: (98-105)/(50-68) 101/50 mmHg (11/07 0815) SpO2:  [100 %] 100 % (11/07 0815) Weight:  [92.5 kg (203 lb 14.8 oz)] 92.5 kg (203 lb 14.8 oz) (11/06 2007) Last BM Date: 07/18/15  Intake/Output from previous day: 11/06 0701 - 11/07 0700 In: 0  Out: 1150  Intake/Output this shift:    PE: Abd: soft, appropriately tender, EC fistula with 200cc of bilious output so far this am.  Lab Results:   Recent Labs  07/19/15 0415  WBC 7.1  HGB 10.3*  HCT 30.6*  PLT 251   BMET  Recent Labs  07/18/15 0940 07/19/15 0415  NA 133* 138  K 5.5* 3.4*  CL 110 104  CO2 8* 18*  GLUCOSE 126* 135*  BUN 156* 148*  CREATININE 3.48* 3.38*  CALCIUM 10.0 9.6   PT/INR No results for input(s): LABPROT, INR in the last 72 hours. CMP     Component Value Date/Time   NA 138 07/19/2015 0415   K 3.4* 07/19/2015 0415   CL 104 07/19/2015 0415   CO2 18* 07/19/2015 0415   GLUCOSE 135* 07/19/2015 0415   BUN 148* 07/19/2015 0415   CREATININE 3.38* 07/19/2015 0415   CREATININE 1.64* 06/27/2014 1647   CALCIUM 9.6 07/19/2015 0415   PROT 8.6* 07/16/2015 0430   ALBUMIN 2.7* 07/16/2015 0430   AST 42* 07/16/2015 0430   ALT 58* 07/16/2015 0430   ALKPHOS 172* 07/16/2015 0430   BILITOT 0.3 07/16/2015 0430   GFRNONAA 12* 07/19/2015 0415   GFRAA 14* 07/19/2015 0415   Lipase     Component Value Date/Time   LIPASE 27 12/26/2014 1815       Studies/Results: No results found.  Anti-infectives: Anti-infectives    Start     Dose/Rate Route Frequency Ordered Stop   07/16/15 1000   vancomycin (VANCOCIN) 1,500 mg in sodium chloride 0.9 % 500 mL IVPB  Status:  Discontinued     1,500 mg 250 mL/hr over 120 Minutes Intravenous Every 48 hours 07/14/15 0956 07/15/15 1035   07/16/15 1000  vancomycin (VANCOCIN) IVPB 1000 mg/200 mL premix     1,000 mg 200 mL/hr over 60 Minutes Intravenous Every 48 hours 07/15/15 1035     07/14/15 1500  cefTRIAXone (ROCEPHIN) 1 g in dextrose 5 % 50 mL IVPB  Status:  Discontinued     1 g 100 mL/hr over 30 Minutes Intravenous Every 24 hours 07/13/15 1647 07/17/15 1113   07/14/15 1015  vancomycin (VANCOCIN) 1,500 mg in sodium chloride 0.9 % 500 mL IVPB     1,500 mg 250 mL/hr over 120 Minutes Intravenous NOW 07/14/15 0943 07/14/15 1210   07/13/15 1400  cefTRIAXone (ROCEPHIN) 1 g in dextrose 5 % 50 mL IVPB     1 g 100 mL/hr over 30 Minutes Intravenous  Once 07/13/15 1359 07/13/15 1526       Assessment/Plan  ECF-eakin's pouch, appreciate WOC management. -accurate I&Os -sandostatin -cont strict NPO as output has significantly increase in the last couple of days for some reason ID-BCs with GPC. repeat  cultures still positive so PICC removed.  PIV was able to finally be obtained.  She needs fairly aggressive hydration given how high her ostomy output is currently to keep her from being so dehydrated.  Once a new PICC can be replaced, she will need to be restarted on her TNA FEN-NPO PCM-TPN Acute on chronic renal failure- per medicine and renal, Cr 3.38 yesterday VTE prophylaxis-SCD/heparin   LOS: 7 days    Rashed Edler E 07/20/2015, 9:10 AM Pager: [phone]

## 2015-07-20 NOTE — Consult Note (Addendum)
WOC follow-up: Pt is off the floor in a procedure.  Bedside nurse states the Eakin pouch to abd fistula was changed last night by the staff nurse and was intact with a good seal when pt went downstairs.  Supplies at the bedside and instructions have been provided for staff if leakage occurs. Julien Girt MSN, RN, [place], [place], [place]

## 2015-07-20 NOTE — Progress Notes (Signed)
CRITICAL VALUE ALERT  Critical value received:  Vanc level 35  Date of notification:  07/20/2015  Time of notification:  11:57 AM  Critical value read back:Yes.    Nurse who received alert:  Penni Bombard, RN  MD notified (1st page):  Dhungel   Time of first page:  11:57 AM  Responding MD: Dhungel   Time MD responded: [phone]

## 2015-07-20 NOTE — Progress Notes (Signed)
Physical Therapy Treatment Patient Details Name: Marissa [name] MRN: [id number] DOB: 01-Apr-1941 Today's Date: 07/20/2015    History of Present Illness 74 y.o. female presenting with emesis. Pt was admitted on 9/22 with AMS, abdominal wounds and pain, dehydration and weakness. PMH: Chronic kidney disease, obstructive sleep apnea, diastolic heart failure, DMII, HTN and Hidradenitis suppurativa.    PT Comments    Pt is getting up to bedside with max assist and cannot control LE's enough to attempt to stand.  Has plan to go to SNF and will need this level of care to continue her progress.  Very weak and cannot control her legs enough for transfers.  Follow Up Recommendations  SNF     Equipment Recommendations  None recommended by PT    Recommendations for Other Services       Precautions / Restrictions Precautions Precautions: Fall Precaution Comments: wound pouch in place mid-stomach region Restrictions Weight Bearing Restrictions: No    Mobility  Bed Mobility Overal bed mobility: Needs Assistance Bed Mobility: Supine to Sit;Sit to Supine Rolling: Mod assist;Max assist   Supine to sit: Max assist;HOB elevated (dense cues for sequence ) Sit to supine: Max assist   General bed mobility comments: assisted with her trunk and controllng transition  Transfers Overall transfer level: Needs assistance               General transfer comment: unable to use LE's effectively to stand  Ambulation/Gait                 Stairs            Wheelchair Mobility    Modified Rankin (Stroke Patients Only)       Balance   Sitting-balance support: Feet supported Sitting balance-Leahy Scale: Fair                              Cognition Arousal/Alertness: Lethargic Behavior During Therapy: WFL for tasks assessed/performed;Flat affect Overall Cognitive Status: Within Functional Limits for tasks assessed                      Exercises       General Comments General comments (skin integrity, edema, etc.): Pt is going to SNF again, will benefit from the additional time and assistance due to increased weakness.  Has vac on her stomach which is a limitation for PT to stand and maneuver her.       Pertinent Vitals/Pain Pain Assessment: Faces Faces Pain Scale: Hurts a little bit Pain Location: abdomen Pain Intervention(s): Limited activity within patient's tolerance;Repositioned;Premedicated before session    Home Living                      Prior Function            PT Goals (current goals can now be found in the care plan section) Acute Rehab PT Goals Patient Stated Goal: to feel better Progress towards PT goals: Progressing toward goals    Frequency  Min 2X/week    PT Plan Current plan remains appropriate    Co-evaluation             End of Session   Activity Tolerance: Patient limited by fatigue;Patient limited by pain Patient left: in bed;with call bell/phone within reach;with bed alarm set     Time: 7564-3329 PT Time Calculation (min) (ACUTE ONLY): 16 min  Charges:  $Therapeutic Activity: 8-22 mins  G CodesRamond Dial 07/20/2015, 12:41 PM   Marissa [name], PT MS Acute Rehab Dept. Number: [phone] [id number] and [place] [phone]

## 2015-07-20 NOTE — Progress Notes (Signed)
[facility] PROGRESS NOTE  Lima Chillemi QQP:[id number] DOB: Mar 12, 1941 DOA: 07/13/2015 PCP: [name], MD   Brief narrative 74 y.o. female, with chronic kidney disease, obstructive sleep apnea, diastolic heart failure, type 2 diabetes,   multiple abdominal surgeries, and a recent discharge after care of an enterocutaneous fistula, presented to the emergency department from [facility] with lethargy, 2 days of vomiting and severe dehydration. On 04/28/15 the patient underwent abdominal wound exploration of enterocutaneous fistula and abscess by Dr. Georgette Dover. She was admitted to the hospital on 9/22 with increased ostomy output and confusion. She was treated with TPN and Sandostatin. An eakin pouch was placed. Once she stabilized she was discharged on 10/4 to SNF. Marland Kitchen Per the patient's daughter, Dr. Georgette Dover felt the patient could begin to take oral food & drink approximately 2 weeks ago. The patient's TPN was discontinued at some point after that. The patient ate fairly well for 4-5 days but then decreased her oral intake. One week back patient's enterocutaneous fistula began to drain green bile once again. Her stools became the color of green bile. Yesterday prior to admission patient had several episodes of biliary vomiting. In the emergency department the patient's bicarbonate was 12, her creatinine is elevated to 3.44 from 1.75, her BUN is 160, sodium 129. Lactic acid was normal. UA positive for UTI. Patient admitted to hospitalist service and general surgery following.   Assessment/Plan: Acute on chronic kidney injury with metabolic acidosis prerenal secondary to dehydration. Has underlying chronic kidney disease stage III. Not improved with gentle hydration. Renal ultrasound unremarkable. Appreciate renal consult. Patient started on Ringer's lactate 2 liters bolus followed by maintenance  Renal function further worsened with metabolic acidosis with hyperkalemia. Discussed with renal  and fluid switched to D5 with bicarbonate. Monitor urine output closely. Bicarb improving. Discontinued indomethacin, colchicine. Avoid all nephrotoxic agents. -not a candidate for HD.   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 Maintain nothing by mouth and started TNA. Currently off PICC  for line bacteremia. Eakin pounds placed to intermittent wall suction. Wound care following. Continue Sandostatin.  Coag negative staph bacteremia with sepsis 2/2 Seen by ID and recommend one week of vancomycin initially . However repeat blood cultures remained positive suggestive of line infection. Discontinued PICC line on 11/4. Will probably need longer duration of vancomycin. Ordered repeat blood culture today. Vancomycin trough supra therapeutic at 35 . Held for now. Poor IV access.  Proteus UTI Pansensitive. completed  5 days of empiric Rocephin.   Acute encephalopathy Possible secondary to sepsis and dehydration. Appears improved but has flattened affect.  Hypokalemia Replenish with IV KCl. Mg normal.  Hyponatremia Secondary to dehydration. Improving today.  Oral thrush Continue nystatin  Chronic diastolic CHF EF of 71%. Currently hypovolemic and on gentle hydration.  Obstructive sleep apnea Continue bedtime CPAP  Prediabetes  monitor with SSI  Severe malnutrition with failure to thrive Holding TPN due to persistent bacteremia .     Code Status: DO NOT RESUSCITATE.prognosis is guarded Family Communication: None at bedside.  Disposition Plan: currently inpatient   Consultants:  CCS  Procedures:  CT abdomen and pelvis  Antibiotics: IV vancomycin 11/1-- and Rocephin 10/31--11/4  HPI/Subjective: Seen and examined. No overnight issues.  Objective: Filed Vitals:   07/20/15 0815  BP: 101/50  Pulse: 62  Temp: 98 F (36.7 C)  Resp: 18    Intake/Output Summary (Last 24 hours) at  07/20/15 1409 Last data filed at 07/20/15  1352  Gross per 24 hour  Intake      0 ml  Output    650 ml  Net   -650 ml   Filed Weights   07/17/15 2030 07/18/15 2001 07/19/15 2007  Weight: 97.387 kg (214 lb 11.2 oz) 98.3 kg (216 lb 11.4 oz) 92.5 kg (203 lb 14.8 oz)    Exam:   General: Female lying in bed not in distress flat affect  HEENT: Pallor, moist oral mucosa   Chest: Clear to auscultation bilaterally  CVS: Normal S1 and S2, no murmurs  GI: Soft, nondistended, nontender, midline wound with fistula, (eakin pouch)  Musculoskeletal: Warm, no edema  CNS: alert and awake.   Data Reviewed: Basic Metabolic Panel:  Recent Labs Lab 07/14/15 0420 07/15/15 0420 07/16/15 0430 07/17/15 0424 07/18/15 0940 07/19/15 0415 07/20/15 1020  NA 128* 130* 130* 127* 133* 138 145  K 4.1 4.0 4.0 4.2 5.5* 3.4* 2.9*  CL 105 104 108 106 110 104 98*  CO2 11* 13* 11* 9* 8* 18* 33*  GLUCOSE 160* 174* 167* 150* 126* 135* 164*  BUN 154* 140* 149* 156* 156* 148* 138*  CREATININE 3.50* 3.21* 3.32* 3.32* 3.48* 3.38* 3.70*  CALCIUM 9.7 9.7 9.4 9.4 10.0 9.6 9.3  MG 2.0 2.3 2.1 1.9  --   --  2.1  PHOS 5.0* 5.6* 4.4 3.8  --   --  3.6   Liver Function Tests:  Recent Labs Lab 07/14/15 0420 07/16/15 0430 07/20/15 1020  AST 46* 42* 99*  ALT 57* 58* 135*  ALKPHOS 191* 172* 174*  BILITOT 0.8 0.3 0.9  PROT 8.1 8.6* 7.6  ALBUMIN 3.0* 2.7* 2.8*   No results for input(s): LIPASE, AMYLASE in the last 168 hours. No results for input(s): AMMONIA in the last 168 hours. CBC:  Recent Labs Lab 07/14/15 0420 07/19/15 0415 07/20/15 1020  WBC 8.5 7.1 7.9  NEUTROABS 6.2  --  5.4  HGB 10.8* 10.3* 9.8*  HCT 32.3* 30.6* 28.6*  MCV 73.1* 71.8* 70.6*  PLT 190 251 242   Cardiac Enzymes: No results for input(s): CKTOTAL, CKMB, CKMBINDEX, TROPONINI in the last 168 hours. BNP (last 3 results)  Recent Labs  11/02/14 2340 03/19/15 1325  BNP 109.2* 141.6*    ProBNP (last 3 results) No results  for input(s): PROBNP in the last 8760 hours.  CBG:  Recent Labs Lab 07/19/15 1137 07/19/15 1626 07/19/15 2001 07/20/15 0725 07/20/15 1139  GLUCAP 128* 144* 147* 151* 130*    Recent Results (from the past 240 hour(s))  Culture, Urine     Status: None   Collection Time: 07/13/15 12:14 PM  Result Value Ref Range Status   Specimen Description URINE, RANDOM  Final   Special Requests ADDED 2258  Final   Culture >=100,000 COLONIES/mL PROTEUS MIRABILIS  Final   Report Status 07/16/2015 FINAL  Final   Organism ID, Bacteria PROTEUS MIRABILIS  Final      Susceptibility   Proteus mirabilis - MIC*    AMPICILLIN <=2 SENSITIVE Sensitive     CEFAZOLIN <=4 SENSITIVE Sensitive     CEFTRIAXONE <=1 SENSITIVE Sensitive     CIPROFLOXACIN <=0.25 SENSITIVE Sensitive     GENTAMICIN <=1 SENSITIVE Sensitive     IMIPENEM 4 SENSITIVE Sensitive     NITROFURANTOIN 128 RESISTANT Resistant     TRIMETH/SULFA <=20 SENSITIVE Sensitive     AMPICILLIN/SULBACTAM <=2 SENSITIVE Sensitive     PIP/TAZO <=4 SENSITIVE Sensitive     * >=100,000 COLONIES/mL PROTEUS MIRABILIS  Blood culture (routine x 2)     Status: None   Collection Time: 07/13/15  2:50 PM  Result Value Ref Range Status   Specimen Description BLOOD PICC LINE  Final   Special Requests BOTTLES DRAWN AEROBIC AND ANAEROBIC 5ML  Final   Culture  Setup Time   Final    GRAM POSITIVE COCCI IN CLUSTERS IN BOTH AEROBIC AND ANAEROBIC BOTTLES CRITICAL RESULT CALLED TO, READ BACK BY AND VERIFIED WITH: TO BQIU(RN) BY TCLEVELAND 07/14/2015 AT 5:58 AM    Culture STAPHYLOCOCCUS SPECIES (COAGULASE NEGATIVE)  Final   Report Status 07/16/2015 FINAL  Final   Organism ID, Bacteria STAPHYLOCOCCUS SPECIES (COAGULASE NEGATIVE)  Final      Susceptibility   Staphylococcus species (coagulase negative) - MIC*    CIPROFLOXACIN <=0.5 SENSITIVE Sensitive     ERYTHROMYCIN <=0.25 SENSITIVE Sensitive     GENTAMICIN <=0.5 SENSITIVE Sensitive     OXACILLIN >=4 RESISTANT Resistant      TETRACYCLINE 2 SENSITIVE Sensitive     VANCOMYCIN 2 SENSITIVE Sensitive     TRIMETH/SULFA <=10 SENSITIVE Sensitive     CLINDAMYCIN >=8 RESISTANT Resistant     RIFAMPIN <=0.5 SENSITIVE Sensitive     Inducible Clindamycin NEGATIVE Sensitive     * STAPHYLOCOCCUS SPECIES (COAGULASE NEGATIVE)  Blood culture (routine x 2)     Status: None   Collection Time: 07/13/15  2:55 PM  Result Value Ref Range Status   Specimen Description BLOOD LEFT HAND  Final   Special Requests   Final    BOTTLES DRAWN AEROBIC AND ANAEROBIC BLUE 5CC, RED 3CC   Culture  Setup Time   Final    GRAM POSITIVE COCCI IN CLUSTERS AEROBIC BOTTLE ONLY CRITICAL RESULT CALLED TO, READ BACK BY AND VERIFIED WITH: J NARAMDAS,RN AT 1122 07/14/15 BY L BENFIELD    Culture   Final    STAPHYLOCOCCUS SPECIES (COAGULASE NEGATIVE) SUSCEPTIBILITIES PERFORMED ON PREVIOUS CULTURE WITHIN THE LAST 5 DAYS.    Report Status 07/16/2015 FINAL  Final  Culture, blood (routine x 2)     Status: None   Collection Time: 07/16/15  5:10 AM  Result Value Ref Range Status   Specimen Description BLOOD LEFT HAND  Final   Special Requests IN PEDIATRIC BOTTLE 2.5CC  Final   Culture  Setup Time   Final    GRAM POSITIVE COCCI IN CLUSTERS AEROBIC BOTTLE ONLY CRITICAL RESULT CALLED TO, READ BACK BY AND VERIFIED WITH: MIKKI MURPHY @0541  07/17/15 MKELLY    Culture   Final    STAPHYLOCOCCUS SPECIES (COAGULASE NEGATIVE) SUSCEPTIBILITIES PERFORMED ON PREVIOUS CULTURE WITHIN THE LAST 5 DAYS.    Report Status 07/18/2015 FINAL  Final  Culture, blood (routine x 2)     Status: None (Preliminary result)   Collection Time: 07/16/15  5:23 AM  Result Value Ref Range Status   Specimen Description BLOOD LEFT HAND  Final   Special Requests IN PEDIATRIC BOTTLE 2CC  Final   Culture NO GROWTH 4 DAYS  Final   Report Status PENDING  Incomplete     Studies: No results found.  Scheduled Meds: . heparin  5,000 Units Subcutaneous 3 times per day  . insulin aspart   0-9 Units Subcutaneous 4 times per day  . morphine      . nystatin  5 mL Oral QID  . octreotide  100 mcg Subcutaneous Q12H  . pantoprazole (PROTONIX) IV  40 mg Intravenous Q24H   Continuous Infusions: . dextrose 5 % 1,000 mL with sodium bicarbonate  150 mEq infusion 125 mL/hr at 07/20/15 0901      Time spent: Redbird Smith, [facility] Pager [phone] If 7PM-7AM, please contact night-coverage at [email or web address], password [facility] 07/20/2015, 2:09 PM  LOS: 7 days

## 2015-07-20 NOTE — Clinical Documentation Improvement (Signed)
Internal Medicine  Please clarify if the following diagnosis, line sepsis,was:  Please update your documentation within the medical record and problem list to reflect your response to this query. Thank you!   Present at the time of admission (POA)  NOT present at the time of admission and it developed during the inpatient stay  Unable to clinically determine whether the condition was present on admission.  Unknown  Please exercise your independent, professional judgment when responding. A specific answer is not anticipated or expected.  Thank You,  Zoila Shutter RN, [place] [phone]; Cell: [phone]

## 2015-07-20 NOTE — Progress Notes (Addendum)
ANTIBIOTIC CONSULT NOTE - FOLLOW UP  Pharmacy Consult for Vancomycin Indication: coag negative staph bacteremia/ PICC line infection  No Known Allergies  Patient Measurements: Height: 5\' 4"  (162.6 cm) Weight: 203 lb 14.8 oz (92.5 kg) IBW/kg (Calculated) : 54.7  Vital Signs: Temp: 98 F (36.7 C) (11/07 0815) Temp Source: Oral (11/07 0815) BP: 101/50 mmHg (11/07 0815) Pulse Rate: 62 (11/07 0815) Intake/Output from previous day: 11/06 0701 - 11/07 0700 In: 0  Out: 1150  Intake/Output from this shift:    Labs:  Recent Labs  07/18/15 0940 07/19/15 0415 07/20/15 1020  WBC  --  7.1 7.9  HGB  --  10.3* 9.8*  PLT  --  251 242  CREATININE 3.48* 3.38* 3.70*   Estimated Creatinine Clearance: 14.7 mL/min (by C-G formula based on Cr of 3.7).  Recent Labs  07/20/15 1015  VANCOTROUGH 35*     Microbiology: Recent Results (from the past 720 hour(s))  Culture, Urine     Status: None   Collection Time: 07/13/15 12:14 PM  Result Value Ref Range Status   Specimen Description URINE, RANDOM  Final   Special Requests ADDED 2258  Final   Culture >=100,000 COLONIES/mL PROTEUS MIRABILIS  Final   Report Status 07/16/2015 FINAL  Final   Organism ID, Bacteria PROTEUS MIRABILIS  Final      Susceptibility   Proteus mirabilis - MIC*    AMPICILLIN <=2 SENSITIVE Sensitive     CEFAZOLIN <=4 SENSITIVE Sensitive     CEFTRIAXONE <=1 SENSITIVE Sensitive     CIPROFLOXACIN <=0.25 SENSITIVE Sensitive     GENTAMICIN <=1 SENSITIVE Sensitive     IMIPENEM 4 SENSITIVE Sensitive     NITROFURANTOIN 128 RESISTANT Resistant     TRIMETH/SULFA <=20 SENSITIVE Sensitive     AMPICILLIN/SULBACTAM <=2 SENSITIVE Sensitive     PIP/TAZO <=4 SENSITIVE Sensitive     * >=100,000 COLONIES/mL PROTEUS MIRABILIS  Blood culture (routine x 2)     Status: None   Collection Time: 07/13/15  2:50 PM  Result Value Ref Range Status   Specimen Description BLOOD PICC LINE  Final   Special Requests BOTTLES DRAWN AEROBIC AND  ANAEROBIC 5ML  Final   Culture  Setup Time   Final    GRAM POSITIVE COCCI IN CLUSTERS IN BOTH AEROBIC AND ANAEROBIC BOTTLES CRITICAL RESULT CALLED TO, READ BACK BY AND VERIFIED WITH: TO BQIU(RN) BY TCLEVELAND 07/14/2015 AT 5:58 AM    Culture STAPHYLOCOCCUS SPECIES (COAGULASE NEGATIVE)  Final   Report Status 07/16/2015 FINAL  Final   Organism ID, Bacteria STAPHYLOCOCCUS SPECIES (COAGULASE NEGATIVE)  Final      Susceptibility   Staphylococcus species (coagulase negative) - MIC*    CIPROFLOXACIN <=0.5 SENSITIVE Sensitive     ERYTHROMYCIN <=0.25 SENSITIVE Sensitive     GENTAMICIN <=0.5 SENSITIVE Sensitive     OXACILLIN >=4 RESISTANT Resistant     TETRACYCLINE 2 SENSITIVE Sensitive     VANCOMYCIN 2 SENSITIVE Sensitive     TRIMETH/SULFA <=10 SENSITIVE Sensitive     CLINDAMYCIN >=8 RESISTANT Resistant     RIFAMPIN <=0.5 SENSITIVE Sensitive     Inducible Clindamycin NEGATIVE Sensitive     * STAPHYLOCOCCUS SPECIES (COAGULASE NEGATIVE)  Blood culture (routine x 2)     Status: None   Collection Time: 07/13/15  2:55 PM  Result Value Ref Range Status   Specimen Description BLOOD LEFT HAND  Final   Special Requests   Final    BOTTLES DRAWN AEROBIC AND ANAEROBIC BLUE 5CC, RED [place]  Culture  Setup Time   Final    GRAM POSITIVE COCCI IN CLUSTERS AEROBIC BOTTLE ONLY CRITICAL RESULT CALLED TO, READ BACK BY AND VERIFIED WITH: J NARAMDAS,RN AT 1122 07/14/15 BY L BENFIELD    Culture   Final    STAPHYLOCOCCUS SPECIES (COAGULASE NEGATIVE) SUSCEPTIBILITIES PERFORMED ON PREVIOUS CULTURE WITHIN THE LAST 5 DAYS.    Report Status 07/16/2015 FINAL  Final  Culture, blood (routine x 2)     Status: None   Collection Time: 07/16/15  5:10 AM  Result Value Ref Range Status   Specimen Description BLOOD LEFT HAND  Final   Special Requests IN PEDIATRIC BOTTLE 2.5CC  Final   Culture  Setup Time   Final    GRAM POSITIVE COCCI IN CLUSTERS AEROBIC BOTTLE ONLY CRITICAL RESULT CALLED TO, READ BACK BY AND VERIFIED  WITH: MIKKI MURPHY @0541  07/17/15 MKELLY    Culture   Final    STAPHYLOCOCCUS SPECIES (COAGULASE NEGATIVE) SUSCEPTIBILITIES PERFORMED ON PREVIOUS CULTURE WITHIN THE LAST 5 DAYS.    Report Status 07/18/2015 FINAL  Final  Culture, blood (routine x 2)     Status: None (Preliminary result)   Collection Time: 07/16/15  5:23 AM  Result Value Ref Range Status   Specimen Description BLOOD LEFT HAND  Final   Special Requests IN PEDIATRIC BOTTLE 2CC  Final   Culture NO GROWTH 4 DAYS  Final   Report Status PENDING  Incomplete    Anti-infectives    Start     Dose/Rate Route Frequency Ordered Stop   07/16/15 1000  vancomycin (VANCOCIN) 1,500 mg in sodium chloride 0.9 % 500 mL IVPB  Status:  Discontinued     1,500 mg 250 mL/hr over 120 Minutes Intravenous Every 48 hours 07/14/15 0956 07/15/15 1035   07/16/15 1000  vancomycin (VANCOCIN) IVPB 1000 mg/200 mL premix  Status:  Discontinued     1,000 mg 200 mL/hr over 60 Minutes Intravenous Every 48 hours 07/15/15 1035 07/20/15 1242   07/14/15 1500  cefTRIAXone (ROCEPHIN) 1 g in dextrose 5 % 50 mL IVPB  Status:  Discontinued     1 g 100 mL/hr over 30 Minutes Intravenous Every 24 hours 07/13/15 1647 07/17/15 1113   07/14/15 1015  vancomycin (VANCOCIN) 1,500 mg in sodium chloride 0.9 % 500 mL IVPB     1,500 mg 250 mL/hr over 120 Minutes Intravenous NOW 07/14/15 0943 07/14/15 1210   07/13/15 1400  cefTRIAXone (ROCEPHIN) 1 g in dextrose 5 % 50 mL IVPB     1 g 100 mL/hr over 30 Minutes Intravenous  Once 07/13/15 1359 07/13/15 1526      Assessment: [name] on chronic TPN PTA presented to ED with dehydration and ARF.  Since then, patient has been found to have coag neg staph bacteremia and presumed PICC line infection.  PICC removed 11/4.  Today is day #6 of antibiotic therapy and patient has acute renal failure.  WBC 7.9 wnl, Afebrile.  Vancomycin trough = 35 mcg/ml , above goal on Vanc  1gm IV q48h Vancomycin last given on 11/5,  RN held today's dose  due to high trough result.  Plan for new PICC,then to restart TNA>  10/31 BCx2 - 2 of 2 CoNS - sens to vanc, cipro , septra and rifampin ; Resistant to Oxacillin & Clinda. 10/31 UCx - Proteus Sens to Rocephin 11/3 blood x 2 - 1/2 CoNS same sens as above 11/4 PICC out 11/4 blood x 2 sent    Goal of Therapy:  Vancomycin trough level 15-20 mcg/ml  Plan:  Hold Vancomycin Check vancomycin random level in AM 11/8, then redoseVanc when level <20 mcg/ml Follow up renal function, culture data, ID recs for LOT.  Thank you for allowing pharmacy to be part of this patients care team. Nicole Cella, RPh Clinical Pharmacist Pager: [phone] 07/20/2015 1:03 PM

## 2015-07-20 NOTE — Care Management Important Message (Signed)
Important Message  Patient Details  Name: Marissa [name] MRN: [id number] Date of Birth: 02/22/1941   Medicare Important Message Given:  Yes-third notification given    Loann Quill 07/20/2015, 1:46 PM

## 2015-07-20 NOTE — Progress Notes (Signed)
OT Cancellation Note  Patient Details Name: Marissa [name] MRN: [id number] DOB: [birth year]   Cancelled Treatment:    Reason Eval/Treat Not Completed: Patient at procedure or test/ unavailable. Transport present to take pt to procedure, currently unavailable.   Lin Landsman 07/20/2015, 1:31 PM

## 2015-07-20 NOTE — Progress Notes (Signed)
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   Subjective:  Remains encephalopathic. Only moans - is nauseated and throwing up IV in place overnight. Bigeminy overnight, asymptomatic.  Small improvement in BUN and SCr yesterday, but no labs yet this am.  I/O's with unmeasured urine output. ECF with 1.1L output. Unclear if she got any fluids overnight based on the charting.  Questionable weight this am 203Lbs.    11/06 0701 - 11/07 0700 In: 0  Out: [place] Weights   07/17/15 2030 07/18/15 2001 07/19/15 2007  Weight: 214 lb 11.2 oz (97.387 kg) 216 lb 11.4 oz (98.3 kg) 203 lb 14.8 oz (92.5 kg)    Scheduled Meds: . heparin  5,000 Units Subcutaneous 3 times per day  . insulin aspart  0-9 Units Subcutaneous 4 times per day  . nystatin  5 mL Oral QID  . octreotide  100 mcg Subcutaneous Q12H  . pantoprazole (PROTONIX) IV  40 mg Intravenous Q24H  . vancomycin  1,000 mg Intravenous Q48H   Continuous Infusions: . dextrose 5 % 1,000 mL with sodium bicarbonate 150 mEq infusion 125 mL/hr at 07/20/15 0020   PRN Meds:.diphenhydrAMINE, Glycerin (Adult), morphine injection, nitroGLYCERIN, ondansetron **OR** ondansetron (ZOFRAN) IV, oxyCODONE, sodium chloride  Current Labs: reviewed    Physical Exam:  Blood pressure 101/50, pulse 62, temperature 98 F (36.7 C), temperature source Oral, resp. rate 18, height 5\' 4"  (1.626 m), weight 203 lb 14.8 oz (92.5 kg), SpO2 100 %. Not very interactive, Oriented to name and general location only.  RRR, no rub Resp: CTA bilaterally.  Ext: WWP, No edema, somewhat tender suspect this is from her gout.  Eakins pouch noted Neuro: Asterixis remains but improved.   A/P 1. AoCKD with profound azotemia 1. Suspecte hypovolemia + NSAID (was about 7kg below recent discharge weight). Weights here are now inaccurate.   2. Baseline SCr = 1.7s 3. Renal US 11/4 w/o obstruction 4. Ensure IV access. Cont hydration with D5W + NaHCO3. Pending am labs.  Daily weights, Daily Renal Panel, Strict I/Os, Avoid nephrotoxins (NSAIDs, judicious IV Contrast).  I agree with Dr. Joelyn Oms that I do not feel she is a candidate for dialysis due to her baseline QOL- will need to hope that her renal function improves with these measures 1. Chrnoic enterocutaneous fistula, intermittent high outpt, TPN dependency -- surgery following 2. NAG metabolic acidosis: likely related to GI losses; follow with hydration - repleting bicarb 3. AMS: likely multifactorial including uremia, cont hydration and follow; no RRT needed as very possible that IVFs will help; not a candidate for long term HD 4. GPC bacteremia and Proteus UTI 1. PICC out 2. ABX per primary 5. FTT -- goals of care / palliative care seem reasonable to explore at this time- agree  Paula Compton, MD PGY 2  Patient seen and examined, agree with above note with above modifications.  Corliss Parish, MD 07/20/2015      Recent Labs Lab 07/15/15 0420 07/16/15 0430 07/17/15 0424 07/18/15 0940 07/19/15 0415  NA 130* 130* 127* 133* 138  K 4.0 4.0 4.2 5.5* 3.4*  CL 104 108 106 110 104  CO2 13* 11* 9* 8* 18*  GLUCOSE 174* 167* 150* 126* 135*  BUN 140* 149* 156* 156* 148*  CREATININE 3.21* 3.32* 3.32* 3.48* 3.38*  CALCIUM 9.7 9.4 9.4 10.0 9.6  PHOS 5.6*  4.4 3.8  --   --     Recent Labs Lab 07/13/15 1335 07/14/15 0420 07/19/15 0415  WBC 8.1 8.5 7.1  NEUTROABS 6.3 6.2  --   HGB 10.7* 10.8* 10.3*  HCT 32.1* 32.3* 30.6*  MCV 73.1* 73.1* 71.8*  PLT 187 190 251

## 2015-07-20 NOTE — Progress Notes (Signed)
Pt refuses cpap

## 2015-07-21 ENCOUNTER — Inpatient Hospital Stay (HOSPITAL_COMMUNITY): Payer: Medicare Other

## 2015-07-21 LAB — CULTURE, BLOOD (ROUTINE X 2): CULTURE: NO GROWTH

## 2015-07-21 LAB — BASIC METABOLIC PANEL
Anion gap: 14 (ref 5–15)
BUN: 103 mg/dL — ABNORMAL HIGH (ref 6–20)
CALCIUM: 8.4 mg/dL — AB (ref 8.9–10.3)
CO2: 39 mmol/L — AB (ref 22–32)
Chloride: 94 mmol/L — ABNORMAL LOW (ref 101–111)
Creatinine, Ser: 3.23 mg/dL — ABNORMAL HIGH (ref 0.44–1.00)
GFR calc Af Amer: 15 mL/min — ABNORMAL LOW (ref >60)
GFR, EST NON AFRICAN AMERICAN: 13 mL/min — AB (ref >60)
GLUCOSE: 162 mg/dL — AB (ref 65–99)
Potassium: 3 mmol/L — ABNORMAL LOW (ref 3.5–5.1)
Sodium: 147 mmol/L — ABNORMAL HIGH (ref 135–145)

## 2015-07-21 LAB — VANCOMYCIN, RANDOM: VANCOMYCIN RM: 32 ug/mL

## 2015-07-21 LAB — GLUCOSE, CAPILLARY
GLUCOSE-CAPILLARY: 129 mg/dL — AB (ref 65–99)
Glucose-Capillary: 144 mg/dL — ABNORMAL HIGH (ref 65–99)
Glucose-Capillary: 88 mg/dL (ref 65–99)
Glucose-Capillary: 123 mg/dL — ABNORMAL HIGH (ref 65–99)

## 2015-07-21 MED ORDER — SODIUM CHLORIDE 0.9 % IV SOLN
INTRAVENOUS | Status: AC
  Administered 2015-07-21: 09:00:00 via INTRAVENOUS
  Filled 2015-07-21 (×3): qty 1000

## 2015-07-21 MED ORDER — DEXTROSE-NACL 5-0.45 % IV SOLN
INTRAVENOUS | Status: AC
  Administered 2015-07-21 – 2015-07-22 (×3): via INTRAVENOUS
  Administered 2015-07-22: 125 mL/h via INTRAVENOUS
  Administered 2015-07-22 – 2015-07-23 (×2): via INTRAVENOUS
  Administered 2015-07-23: 125 mL/h via INTRAVENOUS

## 2015-07-21 MED ORDER — POTASSIUM CHLORIDE 10 MEQ/100ML IV SOLN: 10.0000 meq | INTRAVENOUS | Status: DC

## 2015-07-21 MED ORDER — SODIUM CHLORIDE 0.9 % IV BOLUS (SEPSIS)
1000.0000 mL | Freq: Once | INTRAVENOUS | Status: AC
  Administered 2015-07-21: 1000 mL via INTRAVENOUS

## 2015-07-21 NOTE — Progress Notes (Signed)
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   Subjective:  Family member at bedside says she talks and acts normally when providers are not there. Says her mental status is at baseline.  IV in place overnight. No PICC, no TPN, on Normal Saline as of this am. No nutrition.   GFR slightly improved this am.   I/O's with unmeasured urine output once again. ECF with 500cc output.  Weight stable. 204lbs.   11/07 0701 - 11/08 0700 In: 900 [I.V.:500; IV Piggyback:400] Out: 500   Filed Weights   07/18/15 2001 07/19/15 2007 07/20/15 2123  Weight: 216 lb 11.4 oz (98.3 kg) 203 lb 14.8 oz (92.5 kg) 204 lb 2.3 oz (92.6 kg)    Scheduled Meds: . heparin  5,000 Units Subcutaneous 3 times per day  . insulin aspart  0-9 Units Subcutaneous 4 times per day  . loperamide  2 mg Oral Q12H  . nystatin  5 mL Oral QID  . octreotide  100 mcg Subcutaneous Q12H  . pantoprazole (PROTONIX) IV  40 mg Intravenous Q24H  . sodium chloride  1,000 mL Intravenous Once   Continuous Infusions: . sodium chloride 0.9 % 1,000 mL with potassium chloride 40 mEq infusion     PRN Meds:.diphenhydrAMINE, Glycerin (Adult), iohexol, morphine injection, nitroGLYCERIN, ondansetron **OR** ondansetron (ZOFRAN) IV, oxyCODONE, sodium chloride  Current Labs: reviewed    Physical Exam:  Blood pressure 108/55, pulse 107, temperature 98.6 F (37 C), temperature source Oral, resp. rate 17, height 5\' 4"  (1.626 m), weight 204 lb 2.3 oz (92.6 kg), SpO2 100 %. Not very interactive, says her name and "hospital" more clearly this am. Family member says it's just because she doesn't like the doctors.  RRR, no rub Resp: CTA bilaterally.  Ext: WWP, No edema, somewhat tender suspect this is from her gout.  Eakins pouch noted.  Neuro: No asterixis this am.   A/P 1. AoCKD with profound azotemia that is  improving. BUN 103 this am, Cr 3.23 down from 3.7.  1. Suspect hypovolemia + NSAID (was about 7kg below recent discharge weight). Weights here are now inaccurate.  2. Baseline SCr = 1.7s 3. Renal US 11/4 w/o obstruction  4. Ensure IV access. No PICC. Hydrating with NS and KCL this am. Needs glucose in her fluids given no Nutrition and no PO. May benefit from D50.45%NS given hypernatremia this am as well. Will change fluids today d5 1/2 NS with Kcl 5. Not a dialysis candidate. Thankfully kidney function really improved today Daily weights, Daily Renal Panel, Strict I/Os, Avoid nephrotoxins (NSAIDs, judicious IV Contrast). 1. Chrnoic enterocutaneous fistula, intermittent high outpt, TPN dependency -- surgery following 2. NAG metabolic acidosis: likely related to GI losses; follow with hydration - Bicarb is 39 this am.  3. AMS: likely multifactorial including uremia, cont hydration and follow; Improving somewhat this am.   4. GPC bacteremia and Proteus UTI 1. PICC out 2. ABX per primary. Holding Vanc this am. S/p tx of UTI with Ceftriaxone.  3. Repeat blood cx this am.  5. FTT -- goals of care / palliative care seem reasonable to explore at this time- agree  Paula Compton, MD PGY 2  Patient seen and examined, agree with above note with above modifications. Thankfullly are seeing improvement in renal function because patient is not a candidate for chronic dialysis.  Will modify fluids to give  her free water as well as some sugar and potassium Corliss Parish, MD 07/21/2015         Recent Labs Lab 07/16/15 0430 07/17/15 0424  07/19/15 0415 07/20/15 1020 07/21/15 0610  NA 130* 127*  < > 138 145 147*  K 4.0 4.2  < > 3.4* 2.9* 3.0*  CL 108 106  < > 104 98* 94*  CO2 11* 9*  < > 18* 33* 39*  GLUCOSE 167* 150*  < > 135* 164* 162*  BUN 149* 156*  < > 148* 138* 103*  CREATININE 3.32* 3.32*  < > 3.38* 3.70* 3.23*  CALCIUM 9.4 9.4  < > 9.6 9.3 8.4*  PHOS 4.4 3.8  --   --  3.6  --    < > = values in this interval not displayed.  Recent Labs Lab 07/19/15 0415 07/20/15 1020  WBC 7.1 7.9  NEUTROABS  --  5.4  HGB 10.3* 9.8*  HCT 30.6* 28.6*  MCV 71.8* 70.6*  PLT 251 242

## 2015-07-21 NOTE — Progress Notes (Signed)
RN noticed that patient has not urinated on her shift- patient due for bladder scan at this time

## 2015-07-21 NOTE — Progress Notes (Signed)
ANTIBIOTIC CONSULT NOTE - FOLLOW UP  Pharmacy Consult for Vancomycin Indication: coag negative staph bacteremia/ PICC line infection  No Known Allergies  Patient Measurements: Height: 5\' 4"  (162.6 cm) Weight: 204 lb 2.3 oz (92.6 kg) IBW/kg (Calculated) : 54.7  Vital Signs: Temp: 98.6 F (37 C) (11/08 0438) Temp Source: Oral (11/08 0438) BP: 108/55 mmHg (11/08 0438) Pulse Rate: 107 (11/08 0438) Intake/Output from previous day: 11/07 0701 - 11/08 0700 In: 900 [I.V.:500; IV Piggyback:400] Out: 500  Intake/Output from this shift:    Labs:  Recent Labs  07/19/15 0415 07/20/15 1020 07/21/15 0610  WBC 7.1 7.9  --   HGB 10.3* 9.8*  --   PLT 251 242  --   CREATININE 3.38* 3.70* 3.23*   Estimated Creatinine Clearance: 16.9 mL/min (by C-G formula based on Cr of 3.23).  Recent Labs  07/20/15 1015 07/21/15 0610  VANCOTROUGH 35*  --   VANCORANDOM  --  32     Microbiology: Recent Results (from the past 720 hour(s))  Culture, Urine     Status: None   Collection Time: 07/13/15 12:14 PM  Result Value Ref Range Status   Specimen Description URINE, RANDOM  Final   Special Requests ADDED 2258  Final   Culture >=100,000 COLONIES/mL PROTEUS MIRABILIS  Final   Report Status 07/16/2015 FINAL  Final   Organism ID, Bacteria PROTEUS MIRABILIS  Final      Susceptibility   Proteus mirabilis - MIC*    AMPICILLIN <=2 SENSITIVE Sensitive     CEFAZOLIN <=4 SENSITIVE Sensitive     CEFTRIAXONE <=1 SENSITIVE Sensitive     CIPROFLOXACIN <=0.25 SENSITIVE Sensitive     GENTAMICIN <=1 SENSITIVE Sensitive     IMIPENEM 4 SENSITIVE Sensitive     NITROFURANTOIN 128 RESISTANT Resistant     TRIMETH/SULFA <=20 SENSITIVE Sensitive     AMPICILLIN/SULBACTAM <=2 SENSITIVE Sensitive     PIP/TAZO <=4 SENSITIVE Sensitive     * >=100,000 COLONIES/mL PROTEUS MIRABILIS  Blood culture (routine x 2)     Status: None   Collection Time: 07/13/15  2:50 PM  Result Value Ref Range Status   Specimen  Description BLOOD PICC LINE  Final   Special Requests BOTTLES DRAWN AEROBIC AND ANAEROBIC 5ML  Final   Culture  Setup Time   Final    GRAM POSITIVE COCCI IN CLUSTERS IN BOTH AEROBIC AND ANAEROBIC BOTTLES CRITICAL RESULT CALLED TO, READ BACK BY AND VERIFIED WITH: TO BQIU(RN) BY TCLEVELAND 07/14/2015 AT 5:58 AM    Culture STAPHYLOCOCCUS SPECIES (COAGULASE NEGATIVE)  Final   Report Status 07/16/2015 FINAL  Final   Organism ID, Bacteria STAPHYLOCOCCUS SPECIES (COAGULASE NEGATIVE)  Final      Susceptibility   Staphylococcus species (coagulase negative) - MIC*    CIPROFLOXACIN <=0.5 SENSITIVE Sensitive     ERYTHROMYCIN <=0.25 SENSITIVE Sensitive     GENTAMICIN <=0.5 SENSITIVE Sensitive     OXACILLIN >=4 RESISTANT Resistant     TETRACYCLINE 2 SENSITIVE Sensitive     VANCOMYCIN 2 SENSITIVE Sensitive     TRIMETH/SULFA <=10 SENSITIVE Sensitive     CLINDAMYCIN >=8 RESISTANT Resistant     RIFAMPIN <=0.5 SENSITIVE Sensitive     Inducible Clindamycin NEGATIVE Sensitive     * STAPHYLOCOCCUS SPECIES (COAGULASE NEGATIVE)  Blood culture (routine x 2)     Status: None   Collection Time: 07/13/15  2:55 PM  Result Value Ref Range Status   Specimen Description BLOOD LEFT HAND  Final   Special Requests  Final    BOTTLES DRAWN AEROBIC AND ANAEROBIC BLUE 5CC, RED 3CC   Culture  Setup Time   Final    GRAM POSITIVE COCCI IN CLUSTERS AEROBIC BOTTLE ONLY CRITICAL RESULT CALLED TO, READ BACK BY AND VERIFIED WITH: J NARAMDAS,RN AT 1122 07/14/15 BY L BENFIELD    Culture   Final    STAPHYLOCOCCUS SPECIES (COAGULASE NEGATIVE) SUSCEPTIBILITIES PERFORMED ON PREVIOUS CULTURE WITHIN THE LAST 5 DAYS.    Report Status 07/16/2015 FINAL  Final  Culture, blood (routine x 2)     Status: None   Collection Time: 07/16/15  5:10 AM  Result Value Ref Range Status   Specimen Description BLOOD LEFT HAND  Final   Special Requests IN PEDIATRIC BOTTLE 2.5CC  Final   Culture  Setup Time   Final    GRAM POSITIVE COCCI IN  CLUSTERS AEROBIC BOTTLE ONLY CRITICAL RESULT CALLED TO, READ BACK BY AND VERIFIED WITH: MIKKI MURPHY @0541  07/17/15 MKELLY    Culture   Final    STAPHYLOCOCCUS SPECIES (COAGULASE NEGATIVE) SUSCEPTIBILITIES PERFORMED ON PREVIOUS CULTURE WITHIN THE LAST 5 DAYS.    Report Status 07/18/2015 FINAL  Final  Culture, blood (routine x 2)     Status: None (Preliminary result)   Collection Time: 07/16/15  5:23 AM  Result Value Ref Range Status   Specimen Description BLOOD LEFT HAND  Final   Special Requests IN PEDIATRIC BOTTLE 2CC  Final   Culture NO GROWTH 4 DAYS  Final   Report Status PENDING  Incomplete    Anti-infectives    Start     Dose/Rate Route Frequency Ordered Stop   07/16/15 1000  vancomycin (VANCOCIN) 1,500 mg in sodium chloride 0.9 % 500 mL IVPB  Status:  Discontinued     1,500 mg 250 mL/hr over 120 Minutes Intravenous Every 48 hours 07/14/15 0956 07/15/15 1035   07/16/15 1000  vancomycin (VANCOCIN) IVPB 1000 mg/200 mL premix  Status:  Discontinued     1,000 mg 200 mL/hr over 60 Minutes Intravenous Every 48 hours 07/15/15 1035 07/20/15 1242   07/14/15 1500  cefTRIAXone (ROCEPHIN) 1 g in dextrose 5 % 50 mL IVPB  Status:  Discontinued     1 g 100 mL/hr over 30 Minutes Intravenous Every 24 hours 07/13/15 1647 07/17/15 1113   07/14/15 1015  vancomycin (VANCOCIN) 1,500 mg in sodium chloride 0.9 % 500 mL IVPB     1,500 mg 250 mL/hr over 120 Minutes Intravenous NOW 07/14/15 0943 07/14/15 1210   07/13/15 1400  cefTRIAXone (ROCEPHIN) 1 g in dextrose 5 % 50 mL IVPB     1 g 100 mL/hr over 30 Minutes Intravenous  Once 07/13/15 1359 07/13/15 1526      Assessment: [name] on chronic TPN PTA presented to ED on 10/31 with dehydration and acute on chronic renal failure. H/o chronic enterocutaneous fistula, intermittent high outpt, TPN dependency. S/p tx of UTI with Ceftriaxone.   Today is day #7 of antibiotic therapy on IV vancomycin for coag negative staph bacteremia/ PICC line  infection. PICC was removed. Repeat Cx still pending. If negative, plan to replace PICC and restart TNA.  The steady state vancomycin trough on 11/7 was 35 mcg/ml on Vancomycin 1g IV q48h. Trough is above goal 15-20. Vancomycin dose was held on 11/7. Random vanc level this AM still above goal, = 32 mcg/ml. It has been ~67 hours since last vancomycin  dose was given @10 :55 on 07/18/15.  AonCRF, baseline SCr = 1.7s. SCR= 3.44 on  admit and last 4 days has remained at 3.48>3.38>3.7>3.23 today.  I/O's with unmeasured urine output once again.  WBC 7.9 wnl, Afebrile.  ID consulted on 11/2, initially recommended 7 days of vancomcyin.  Repeat blood cultures remained positive suggestive of line infection, thus discontinued PICC line and MD notes patient likely needs longer duration of Vancomycin. Currently vanc level is SUPRAtherapeutic (trough/random level>20 mcg/ml) on day #7 of Vanc.   Ceftriaxone 10/31>>11/3 Vancomycin 10/31>>     VT = 35 (11/7) on 1g IV q48h>>Vanc held.      VR = 32  (11/8)  10/31 BCx2 - 2 of 2 CoNS - sens to vanc, cipro , septra and rifampin ; Resistant to Oxacillin & Clinda. 10/31 UCx - Proteus Sens to Rocephin, pan sens except R-nitrofur 11/3 blood x 2 - 1/2 CoNS same sens as above 11/4 PICC out 11/4 blood x 2 ngtd  11/7 blood x2 sent 11/8 blood x 1    Goal of Therapy:  Vancomycin trough level 15-20 mcg/ml  Plan:  Continue to hold Vancomycin Check vancomycin random level in 48hrs to determine when to redose Vanc, when level <20 mcg/ml if vancomycin to continue.  Follow up renal function, culture data, ID recs for LOT.  Thank you for allowing pharmacy to be part of this patients care team. Nicole Cella, RPh Clinical Pharmacist Pager: [phone] 07/21/2015 9:17 AM

## 2015-07-21 NOTE — Progress Notes (Signed)
Patient ID: Marissa [name], female   DOB: 01/19/1941, 74 y.o.   MRN: [id number]    Subjective: Pt sleepy.  No new complaints, except Eakins pouch leaking  Objective: Vital signs in last 24 hours: Temp:  [98 F (36.7 C)-98.6 F (37 C)] 98.6 F (37 C) (11/08 0438) Pulse Rate:  [62-107] 107 (11/08 0438) Resp:  [17-18] 17 (11/08 0438) BP: (100-119)/(50-68) 108/55 mmHg (11/08 0438) SpO2:  [96 %-100 %] 100 % (11/08 0438) Weight:  [92.6 kg (204 lb 2.3 oz)] 92.6 kg (204 lb 2.3 oz) (11/07 2123) Last BM Date: 07/18/15  Intake/Output from previous day: 11/07 0701 - 11/08 0700 In: 900 [I.V.:500; IV Piggyback:400] Out: 500  Intake/Output this shift:    PE: Abd: soft, Eakin's pouch in place, but leaking at the bottom.  About 500cc of output documented yesterday.  Lab Results:   Recent Labs  07/19/15 0415 07/20/15 1020  WBC 7.1 7.9  HGB 10.3* 9.8*  HCT 30.6* 28.6*  PLT 251 242   BMET  Recent Labs  07/20/15 1020 07/21/15 0610  NA 145 147*  K 2.9* 3.0*  CL 98* 94*  CO2 33* 39*  GLUCOSE 164* 162*  BUN 138* 103*  CREATININE 3.70* 3.23*  CALCIUM 9.3 8.4*   PT/INR No results for input(s): LABPROT, INR in the last 72 hours. CMP     Component Value Date/Time   NA 147* 07/21/2015 0610   K 3.0* 07/21/2015 0610   CL 94* 07/21/2015 0610   CO2 39* 07/21/2015 0610   GLUCOSE 162* 07/21/2015 0610   BUN 103* 07/21/2015 0610   CREATININE 3.23* 07/21/2015 0610   CREATININE 1.64* 06/27/2014 1647   CALCIUM 8.4* 07/21/2015 0610   PROT 7.6 07/20/2015 1020   ALBUMIN 2.8* 07/20/2015 1020   AST 99* 07/20/2015 1020   ALT 135* 07/20/2015 1020   ALKPHOS 174* 07/20/2015 1020   BILITOT 0.9 07/20/2015 1020   GFRNONAA 13* 07/21/2015 0610   GFRAA 15* 07/21/2015 0610   Lipase     Component Value Date/Time   LIPASE 27 12/26/2014 1815       Studies/Results: Dg Ugi W/small Bowel  07/20/2015  CLINICAL DATA:  74 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 Prior repair. Question  level of fistula. Subsequent encounter. EXAM: UPPER GI SERIES WITH SMALL BOWEL FOLLOW-THROUGH FLUOROSCOPY TIME:  Radiation Exposure Index (as provided by the fluoroscopic device): 1634.12 micro Gy TECHNIQUE: Water-soluble upper GI series followed by small bowel were obtained after discussion with Dr. Kieth Brightly. COMPARISON:  None. FINDINGS: Draining clips are noted on scout view. Patient was only able to ingest a small amount of Omnipaque 300 at one time. Total of 150 cc was ingested. The present examination was specifically tailored to evaluate for level of the enterocutaneous fistula. Gastroesophageal reflux noted. Patient was kept in the upright position to avoid aspiration. Scattered duodenal diverticulum. Nonspecific eggshell calcification right upper quadrant. Enterocutaneous fistulas best seen on lateral overhead imaging. This appears to arise from the proximal to mid ileum. Distal aspect of the small bowel not visualized. Patient was tired of the exam at this point. Study terminated. IMPRESSION: Enterocutaneous fistulas best seen on lateral overhead imaging. This appears to arise from the proximal to mid ileum. Exam otherwise limited as noted above. Electronically Signed   By: Genia Del M.D.   On: 07/20/2015 15:46    Anti-infectives: Anti-infectives    Start     Dose/Rate Route Frequency Ordered Stop   07/16/15 1000  vancomycin (VANCOCIN) 1,500 mg in sodium chloride 0.9 %  500 mL IVPB  Status:  Discontinued     1,500 mg 250 mL/hr over 120 Minutes Intravenous Every 48 hours 07/14/15 0956 07/15/15 1035   07/16/15 1000  vancomycin (VANCOCIN) IVPB 1000 mg/200 mL premix  Status:  Discontinued     1,000 mg 200 mL/hr over 60 Minutes Intravenous Every 48 hours 07/15/15 1035 07/20/15 1242   07/14/15 1500  cefTRIAXone (ROCEPHIN) 1 g in dextrose 5 % 50 mL IVPB  Status:  Discontinued     1 g 100 mL/hr over 30 Minutes Intravenous Every 24 hours 07/13/15 1647 07/17/15 1113   07/14/15 1015  vancomycin  (VANCOCIN) 1,500 mg in sodium chloride 0.9 % 500 mL IVPB     1,500 mg 250 mL/hr over 120 Minutes Intravenous NOW 07/14/15 0943 07/14/15 1210   07/13/15 1400  cefTRIAXone (ROCEPHIN) 1 g in dextrose 5 % 50 mL IVPB     1 g 100 mL/hr over 30 Minutes Intravenous  Once 07/13/15 1359 07/13/15 1526       Assessment/Plan  ECF-eakin's pouch, appreciate WOC management. -accurate I&Os -sandostatin -cont strict NPO as output is still relatively high.  Was 80cc yesterday -SBFT did show EC fistula to be fairly distal in the proximal to mid ileum ID-BCs with GPC. repeat Cx still pending.  If negative, replace PICC and restart TNA FEN-NPO PCM- on picc holiday so no new nutrition right now.  Latest prealbumin is 35 Acute on chronic renal failure- per medicine and renal, Cr 3.23 yesterday VTE prophylaxis-SCD/heparin    LOS: 8 days    Ilham Roughton E 07/21/2015, 7:42 AM Pager: [phone]

## 2015-07-21 NOTE — Progress Notes (Signed)
Occupational Therapy Treatment Patient Details Name: Marissa [name] MRN: [id number] DOB: 11-Jun-1941 Today's Date: 07/21/2015    History of present illness 74 y.o. female presenting with emesis. Pt was admitted on 9/22 with AMS, abdominal wounds and pain, dehydration and weakness. PMH: Chronic kidney disease, obstructive sleep apnea, diastolic heart failure, DMII, HTN and Hidradenitis suppurativa.   OT comments  Pt progressing towards acute OT goals. Able to come to EOB with max A and sit in supported sitting position for about 4 minutes to complete simple ADL task with assistance. OT to continue to follow. Session details below. D/c plan remains appropriate.   Follow Up Recommendations  SNF    Equipment Recommendations       Recommendations for Other Services      Precautions / Restrictions Precautions Precautions: Fall Precaution Comments: wound pouch in place mid-stomach region Restrictions Weight Bearing Restrictions: No       Mobility Bed Mobility Overal bed mobility: Needs Assistance Bed Mobility: Supine to Sit;Sit to Supine Rolling: Mod assist;Max assist   Supine to sit: Max assist;HOB elevated Sit to supine: Max assist   General bed mobility comments: With encouragement, pt able to assist in advancing BLE towards EOB.   Transfers                      Balance Overall balance assessment: Needs assistance Sitting-balance support: Bilateral upper extremity supported;Feet supported Sitting balance-Leahy Scale: Poor Sitting balance - Comments: BUE support for most of EOB sitting. Fatigues easily. Able to briefly use single extremity support to assist with combing hair.                            ADL Overall ADL's : Needs assistance/impaired     Grooming: Brushing hair;Moderate assistance;Sitting Grooming Details (indicate cue type and reason): Pt fatiuges easily in sitting position.                               General ADL  Comments: Pt completed bed mobility and sat EOB for about 4 minutes for simple ADL task. Encouragment needed. Pt then completed rolling to each side for pad and gown change.       Vision                     Perception     Praxis      Cognition   Behavior During Therapy: Anxious;Flat affect;WFL for tasks assessed/performed Overall Cognitive Status: No family/caregiver present to determine baseline cognitive functioning                       Extremity/Trunk Assessment               Exercises     Shoulder Instructions       General Comments      Pertinent Vitals/ Pain       Pain Assessment: Faces Faces Pain Scale: Hurts even more Pain Location: c/o back discomfort "I don't know what it is" Pain Intervention(s): Monitored during session;Limited activity within patient's tolerance;Repositioned  Home Living                                          Prior Functioning/Environment  Frequency Min 2X/week     Progress Toward Goals  OT Goals(current goals can now be found in the care plan section)  Progress towards OT goals: Progressing toward goals  Acute Rehab OT Goals Patient Stated Goal: to feel better OT Goal Formulation: With patient Time For Goal Achievement: 07/28/15 Potential to Achieve Goals: Fair ADL Goals Pt Will Perform Grooming: sitting;with set-up Pt Will Transfer to Toilet: with +2 assist;with total assist;squat pivot transfer;bedside commode Pt Will Perform Toileting - Clothing Manipulation and hygiene: with mod assist;sitting/lateral leans Pt/caregiver will Perform Home Exercise Program: Increased strength;Both right and left upper extremity;With theraband;With Supervision Additional ADL Goal #1: Pt will be mod A for bed mobility for precursor to ADL participation  Plan Discharge plan remains appropriate    Co-evaluation                 End of Session     Activity Tolerance Patient  limited by lethargy;Patient limited by pain   Patient Left in bed;with call bell/phone within reach;with bed alarm set   Nurse Communication          Time: [phone] OT Time Calculation (min): 22 min  Charges: OT General Charges $OT Visit: 1 Procedure OT Treatments $Self Care/Home Management : 8-22 mins  Hortencia Pilar 07/21/2015, 2:57 PM

## 2015-07-21 NOTE — Consult Note (Addendum)
WOC wound follow up Current pouch is leaking behind the barrier to lower abd area. Wound type: Full thickness wound to midline abd with fistula, has decreased in size since admission.  6X3X2cm Wound bed: Dark red, small amt bleeding to wound edges Drainage (amount, consistency, odor) Large amt green drainage in cannister Periwound: Red macerated weeping skin with partial thickness breakdown from moisture associated skin damage related to previous pouch leakage, has greatly improved. Dressing procedure/placement/frequency: Applied skin prep to protect skin, then large Eakin pouch with red rubber catheter to low wall suction to control drainage and promote healing to macerated skin. Dressing change no longer painful. [place] team will continue to follow. Supplies at bedside and instructions provided for staff nurses if leakage occurs. Julien Girt MSN, RN, [place], [place], [place]

## 2015-07-21 NOTE — Progress Notes (Signed)
Verbal orders from MD to d/c telemetry at this time.

## 2015-07-21 NOTE — Progress Notes (Signed)
  Pt admitted to the unit. Pt is stable, alert and oriented per baseline. Oriented to room, staff, and call bell. Educated to call for any assistance. Bed in lowest position, call bell within reach- will continue to monitor. 

## 2015-07-21 NOTE — Progress Notes (Addendum)
Nutrition Follow-up  DOCUMENTATION CODES:   Obesity unspecified  INTERVENTION:   -Recommend re-start TPN once able  NUTRITION DIAGNOSIS:   Inadequate oral intake related to inability to eat as evidenced by NPO status.  Ongoing  GOAL:   Patient will meet greater than or equal to 90% of their needs  Unmet  MONITOR:   Diet advancement, Weight trends, Labs, I & O's, Skin  REASON FOR ASSESSMENT:   Consult New TPN/TNA  ASSESSMENT:   74 y.o. 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  PICC line removed on 07/17/15, due to persistent bactremia. TPN subsequently held. Remains on line holiday. Plan to replace PICC once infection resolves.   Reviewed [facility]RN note from 07/21/15; pt remains with full thickness wound to midline fistula. Per RN, wound has decreased in size since admission; remains with large eakin pouch connected to suction.  CSW following; plan to return to SNF once medically stable.   Labs reviewed: Na: 147, K: 3.0, CBGS: 88-144.   Diet Order:  Diet NPO time specified Except for: Sips with Meds  Skin:  Wound (see comment) (medical abdominal wound with eakin to suction)  Last BM:  07/18/15  Height:   Ht Readings from Last 1 Encounters:  07/15/15 5\' 4"  (1.626 m)    Weight:   Wt Readings from Last 1 Encounters:  07/20/15 204 lb 2.3 oz (92.6 kg)    Ideal Body Weight:  54.5 kg  BMI:  Body mass index is 35.02 kg/(m^2).  Estimated Nutritional Needs:   Kcal:  1700-1900  Protein:  100-110 grams  Fluid:  1.7-1.9 L/day  EDUCATION NEEDS:   No education needs identified at this time  Ketzia Guzek A. Jimmye Norman, RD, LDN, CDE Pager: [phone] After hours Pager: [phone]

## 2015-07-21 NOTE — Progress Notes (Addendum)
[facility] PROGRESS NOTE  Marissa [name] TDV:[id number] DOB: 24-May-1941 DOA: 07/13/2015 PCP: [name], MD   Brief narrative 74 y.o. female, with chronic kidney disease, obstructive sleep apnea, diastolic heart failure, type 2 diabetes,   multiple abdominal surgeries, and a recent discharge after care of an enterocutaneous fistula, presented to the emergency department from [facility] with lethargy, 2 days of vomiting and severe dehydration. On 04/28/15 the patient underwent abdominal wound exploration of enterocutaneous fistula and abscess by Dr. Georgette Dover. She was admitted to the hospital on 9/22 with increased ostomy output and confusion. She was treated with TPN and Sandostatin. An eakin pouch was placed. Once she stabilized she was discharged on 10/4 to SNF. Marland Kitchen Per the patient's daughter, Dr. Georgette Dover felt the patient could begin to take oral food & drink approximately 2 weeks ago. The patient's TPN was discontinued at some point after that. The patient ate fairly well for 4-5 days but then decreased her oral intake. One week back patient's enterocutaneous fistula began to drain green bile once again. Her stools became the color of green bile. Yesterday prior to admission patient had several episodes of biliary vomiting. In the emergency department the patient's bicarbonate was 12, her creatinine is elevated to 3.44 from 1.75, her BUN is 160, sodium 129. Lactic acid was normal. UA positive for UTI. Patient admitted to hospitalist service and general surgery following.   Assessment/Plan: Acute on chronic kidney injury with metabolic acidosis prerenal secondary to dehydration. Has underlying chronic kidney disease stage III. Not improved with gentle hydration. Renal ultrasound unremarkable. Appreciate renal consult.  -Given Ringer's lactate followed by D5 with bicarbonate and now getting D5 with half-normal saline as bicarbonate has significantly improved. Renal output and AKI  improving Discontinued indomethacin, colchicine. Avoid all nephrotoxic agents. -not a candidate for HD.   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 Maintain nothing by mouth and started TNA. Currently off PICC  for line bacteremia. Eakin pounds placed to intermittent wall suction. Wound care following. Continue Sandostatin.  Coag negative staph bacteremia with sepsis 2/2 -Placed on empiric IV vancomycin. Repeat blood culture from 11/3 still positive for coag-negative bacteremia. PICC line discontinued and repeat blood culture sent. Discussed with Dr. Linus Salmons. Recommend 2 week course of IV vancomycin. ( stop date 11/14; Vancomycin level currently supratherapeutic). order PICC line tomorrow if blood culture from 11/7 negative.  Proteus UTI Pansensitive. completed  5 days of empiric Rocephin.  transaminitis LFTs in a.m. labs. Check hepatitis panel and abdominal ultrasound.  Acute encephalopathy Possible secondary to sepsis and dehydration. Appears improved but has flattened affect.  Hypokalemia Replenish with IV kcl . Mg normal.  Hyponatremia Secondary to dehydration. Improving today.  Oral thrush Continue nystatin  Chronic diastolic CHF EF of 06%. Currently hypovolemic and on gentle hydration.  Obstructive sleep apnea Continue bedtime CPAP  Prediabetes  monitor with SSI  Severe malnutrition with failure to thrive Holding TPN due to persistent bacteremia .   OSA On CPAP. Pt refusing to use.  Diet: NPO  DVT prophylaxis: SQ heparin   Code Status: DO NOT RESUSCITATE.prognosis is guarded. We'll consult palliative care for goals of care. Family Communication: Discussed with daughter Marissa [name] on the phone on 11/7. Disposition Plan: currently inpatient. Return to SNF once PICC line placed back and renal fn continues to improve. Possibly in 48-72 hrs   Consultants:  CCS  renal  Procedures:  CT  abdomen and pelvis  Antibiotics: IV vancomycin 11/1--until  11/14 and Rocephin 10/31--11/4  HPI/Subjective: Seen and examined. No overnight issues.  Objective: Filed Vitals:   07/21/15 1200  BP: 118/51  Pulse: 96  Temp: 98.9 F (37.2 C)  Resp: 18    Intake/Output Summary (Last 24 hours) at 07/21/15 1538 Last data filed at 07/21/15 1497  Gross per 24 hour  Intake    900 ml  Output    800 ml  Net    100 ml   Filed Weights   07/18/15 2001 07/19/15 2007 07/20/15 2123  Weight: 98.3 kg (216 lb 11.4 oz) 92.5 kg (203 lb 14.8 oz) 92.6 kg (204 lb 2.3 oz)    Exam:   General: Female lying in bed not in distress flat affect  HEENT: moist oral mucosa   Chest: Clear to auscultation bilaterally  CVS: Normal S1 and S2, no murmurs  GI: Soft, nondistended, nontender, midline wound with fistula, (eakin pouch)  Musculoskeletal: Warm, no edema  CNS: alert and awake.   Data Reviewed: Basic Metabolic Panel:  Recent Labs Lab 07/15/15 0420 07/16/15 0430 07/17/15 0424 07/18/15 0940 07/19/15 0415 07/20/15 1020 07/21/15 0610  NA 130* 130* 127* 133* 138 145 147*  K 4.0 4.0 4.2 5.5* 3.4* 2.9* 3.0*  CL 104 108 106 110 104 98* 94*  CO2 13* 11* 9* 8* 18* 33* 39*  GLUCOSE 174* 167* 150* 126* 135* 164* 162*  BUN 140* 149* 156* 156* 148* 138* 103*  CREATININE 3.21* 3.32* 3.32* 3.48* 3.38* 3.70* 3.23*  CALCIUM 9.7 9.4 9.4 10.0 9.6 9.3 8.4*  MG 2.3 2.1 1.9  --   --  2.1  --   PHOS 5.6* 4.4 3.8  --   --  3.6  --    Liver Function Tests:  Recent Labs Lab 07/16/15 0430 07/20/15 1020  AST 42* 99*  ALT 58* 135*  ALKPHOS 172* 174*  BILITOT 0.3 0.9  PROT 8.6* 7.6  ALBUMIN 2.7* 2.8*   No results for input(s): LIPASE, AMYLASE in the last 168 hours. No results for input(s): AMMONIA in the last 168 hours. CBC:  Recent Labs Lab 07/19/15 0415 07/20/15 1020  WBC 7.1 7.9  NEUTROABS  --  5.4  HGB 10.3* 9.8*  HCT 30.6* 28.6*  MCV 71.8* 70.6*  PLT 251 242   Cardiac  Enzymes: No results for input(s): CKTOTAL, CKMB, CKMBINDEX, TROPONINI in the last 168 hours. BNP (last 3 results)  Recent Labs  11/02/14 2340 03/19/15 1325  BNP 109.2* 141.6*    ProBNP (last 3 results) No results for input(s): PROBNP in the last 8760 hours.  CBG:  Recent Labs Lab 07/20/15 1139 07/20/15 1629 07/20/15 2122 07/21/15 0743 07/21/15 1242  GLUCAP 130* 146* 122* 144* 88    Recent Results (from the past 240 hour(s))  Culture, Urine     Status: None   Collection Time: 07/13/15 12:14 PM  Result Value Ref Range Status   Specimen Description URINE, RANDOM  Final   Special Requests ADDED 2258  Final   Culture >=100,000 COLONIES/mL PROTEUS MIRABILIS  Final   Report Status 07/16/2015 FINAL  Final   Organism ID, Bacteria PROTEUS MIRABILIS  Final      Susceptibility   Proteus mirabilis - MIC*    AMPICILLIN <=2 SENSITIVE Sensitive     CEFAZOLIN <=4 SENSITIVE Sensitive     CEFTRIAXONE <=1 SENSITIVE Sensitive     CIPROFLOXACIN <=0.25 SENSITIVE Sensitive     GENTAMICIN <=1 SENSITIVE Sensitive     IMIPENEM 4 SENSITIVE Sensitive  NITROFURANTOIN 128 RESISTANT Resistant     TRIMETH/SULFA <=20 SENSITIVE Sensitive     AMPICILLIN/SULBACTAM <=2 SENSITIVE Sensitive     PIP/TAZO <=4 SENSITIVE Sensitive     * >=100,000 COLONIES/mL PROTEUS MIRABILIS  Blood culture (routine x 2)     Status: None   Collection Time: 07/13/15  2:50 PM  Result Value Ref Range Status   Specimen Description BLOOD PICC LINE  Final   Special Requests BOTTLES DRAWN AEROBIC AND ANAEROBIC 5ML  Final   Culture  Setup Time   Final    GRAM POSITIVE COCCI IN CLUSTERS IN BOTH AEROBIC AND ANAEROBIC BOTTLES CRITICAL RESULT CALLED TO, READ BACK BY AND VERIFIED WITH: TO BQIU(RN) BY TCLEVELAND 07/14/2015 AT 5:58 AM    Culture STAPHYLOCOCCUS SPECIES (COAGULASE NEGATIVE)  Final   Report Status 07/16/2015 FINAL  Final   Organism ID, Bacteria STAPHYLOCOCCUS SPECIES (COAGULASE NEGATIVE)  Final      Susceptibility    Staphylococcus species (coagulase negative) - MIC*    CIPROFLOXACIN <=0.5 SENSITIVE Sensitive     ERYTHROMYCIN <=0.25 SENSITIVE Sensitive     GENTAMICIN <=0.5 SENSITIVE Sensitive     OXACILLIN >=4 RESISTANT Resistant     TETRACYCLINE 2 SENSITIVE Sensitive     VANCOMYCIN 2 SENSITIVE Sensitive     TRIMETH/SULFA <=10 SENSITIVE Sensitive     CLINDAMYCIN >=8 RESISTANT Resistant     RIFAMPIN <=0.5 SENSITIVE Sensitive     Inducible Clindamycin NEGATIVE Sensitive     * STAPHYLOCOCCUS SPECIES (COAGULASE NEGATIVE)  Blood culture (routine x 2)     Status: None   Collection Time: 07/13/15  2:55 PM  Result Value Ref Range Status   Specimen Description BLOOD LEFT HAND  Final   Special Requests   Final    BOTTLES DRAWN AEROBIC AND ANAEROBIC BLUE 5CC, RED 3CC   Culture  Setup Time   Final    GRAM POSITIVE COCCI IN CLUSTERS AEROBIC BOTTLE ONLY CRITICAL RESULT CALLED TO, READ BACK BY AND VERIFIED WITH: J NARAMDAS,RN AT 1122 07/14/15 BY L BENFIELD    Culture   Final    STAPHYLOCOCCUS SPECIES (COAGULASE NEGATIVE) SUSCEPTIBILITIES PERFORMED ON PREVIOUS CULTURE WITHIN THE LAST 5 DAYS.    Report Status 07/16/2015 FINAL  Final  Culture, blood (routine x 2)     Status: None   Collection Time: 07/16/15  5:10 AM  Result Value Ref Range Status   Specimen Description BLOOD LEFT HAND  Final   Special Requests IN PEDIATRIC BOTTLE 2.5CC  Final   Culture  Setup Time   Final    GRAM POSITIVE COCCI IN CLUSTERS AEROBIC BOTTLE ONLY CRITICAL RESULT CALLED TO, READ BACK BY AND VERIFIED WITH: MIKKI MURPHY @0541  07/17/15 MKELLY    Culture   Final    STAPHYLOCOCCUS SPECIES (COAGULASE NEGATIVE) SUSCEPTIBILITIES PERFORMED ON PREVIOUS CULTURE WITHIN THE LAST 5 DAYS.    Report Status 07/18/2015 FINAL  Final  Culture, blood (routine x 2)     Status: None   Collection Time: 07/16/15  5:23 AM  Result Value Ref Range Status   Specimen Description BLOOD LEFT HAND  Final   Special Requests IN PEDIATRIC BOTTLE 2CC   Final   Culture NO GROWTH 5 DAYS  Final   Report Status 07/21/2015 FINAL  Final  Culture, blood (routine x 2)     Status: None (Preliminary result)   Collection Time: 07/20/15  8:04 PM  Result Value Ref Range Status   Specimen Description BLOOD LEFT HAND  Final   Special Requests IN  PEDIATRIC BOTTLE 1CC  Final   Culture NO GROWTH < 24 HOURS  Final   Report Status PENDING  Incomplete     Studies: Dg Ugi W/small Bowel  07/20/2015  CLINICAL DATA:  74 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 Prior repair. Question level of fistula. Subsequent encounter. EXAM: UPPER GI SERIES WITH SMALL BOWEL FOLLOW-THROUGH FLUOROSCOPY TIME:  Radiation Exposure Index (as provided by the fluoroscopic device): 1634.12 micro Gy TECHNIQUE: Water-soluble upper GI series followed by small bowel were obtained after discussion with Dr. Kieth Brightly. COMPARISON:  None. FINDINGS: Draining clips are noted on scout view. Patient was only able to ingest a small amount of Omnipaque 300 at one time. Total of 150 cc was ingested. The present examination was specifically tailored to evaluate for level of the enterocutaneous fistula. Gastroesophageal reflux noted. Patient was kept in the upright position to avoid aspiration. Scattered duodenal diverticulum. Nonspecific eggshell calcification right upper quadrant. Enterocutaneous fistulas best seen on lateral overhead imaging. This appears to arise from the proximal to mid ileum. Distal aspect of the small bowel not visualized. Patient was tired of the exam at this point. Study terminated. IMPRESSION: Enterocutaneous fistulas best seen on lateral overhead imaging. This appears to arise from the proximal to mid ileum. Exam otherwise limited as noted above. Electronically Signed   By: Genia Del M.D.   On: 07/20/2015 15:46    Scheduled Meds: . heparin  5,000 Units Subcutaneous 3 times per day  . insulin aspart  0-9 Units Subcutaneous 4 times per day  . loperamide  2 mg Oral  Q12H  . nystatin  5 mL Oral QID  . octreotide  100 mcg Subcutaneous Q12H  . pantoprazole (PROTONIX) IV  40 mg Intravenous Q24H   Continuous Infusions: . dextrose 5 % and 0.45% NaCl 125 mL/hr at 07/21/15 1209  . sodium chloride 0.9 % 1,000 mL with potassium chloride 40 mEq infusion 125 mL/hr at 07/21/15 8366      Time spent: [place], [place]  [facility] Pager [phone] If 7PM-7AM, please contact night-coverage at [email or web address], password [facility] 07/21/2015, 3:38 PM  LOS: 8 days

## 2015-07-21 NOTE — Progress Notes (Signed)
Patient refused CPAP for the night  

## 2015-07-21 NOTE — Clinical Social Work Note (Signed)
Patient has moved to 6N unit today. Receiving CSW Lubertha Sayres contacted and handoff provided.  Patient's daughter, Gigi Onstad [phone]) contacted and message left regarding patient's move.   Hilmer Aliberti Givens, MSW, LCSW Licensed Clinical Social Worker [place] [phone]

## 2015-07-22 ENCOUNTER — Ambulatory Visit: Payer: Medicare Other | Admitting: Pulmonary Disease

## 2015-07-22 LAB — BASIC METABOLIC PANEL
ANION GAP: 14 (ref 5–15)
BUN: 76 mg/dL — ABNORMAL HIGH (ref 6–20)
CHLORIDE: 99 mmol/L — AB (ref 101–111)
CO2: 34 mmol/L — ABNORMAL HIGH (ref 22–32)
Calcium: 8.5 mg/dL — ABNORMAL LOW (ref 8.9–10.3)
Creatinine, Ser: 3.08 mg/dL — ABNORMAL HIGH (ref 0.44–1.00)
GFR calc Af Amer: 16 mL/min — ABNORMAL LOW (ref >60)
GFR, EST NON AFRICAN AMERICAN: 14 mL/min — AB (ref >60)
Glucose, Bld: 134 mg/dL — ABNORMAL HIGH (ref 65–99)
POTASSIUM: 3.4 mmol/L — AB (ref 3.5–5.1)
SODIUM: 147 mmol/L — AB (ref 135–145)

## 2015-07-22 LAB — GLUCOSE, CAPILLARY
GLUCOSE-CAPILLARY: 89 mg/dL (ref 65–99)
GLUCOSE-CAPILLARY: 125 mg/dL — AB (ref 65–99)
Glucose-Capillary: 134 mg/dL — ABNORMAL HIGH (ref 65–99)
Glucose-Capillary: 115 mg/dL — ABNORMAL HIGH (ref 65–99)

## 2015-07-22 LAB — HEPATIC FUNCTION PANEL
ALBUMIN: 2.6 g/dL — AB (ref 3.5–5.0)
ALT: 161 U/L — AB (ref 14–54)
AST: 93 U/L — AB (ref 15–41)
Alkaline Phosphatase: 146 U/L — ABNORMAL HIGH (ref 38–126)
Bilirubin, Direct: 0.3 mg/dL (ref 0.1–0.5)
Indirect Bilirubin: 0.6 mg/dL (ref 0.3–0.9)
TOTAL PROTEIN: 6.9 g/dL (ref 6.5–8.1)
Total Bilirubin: 0.9 mg/dL (ref 0.3–1.2)

## 2015-07-22 LAB — HEPATITIS PANEL, ACUTE
HCV Ab: 0.1 s/co ratio (ref 0.0–0.9)
HEP B C IGM: NEGATIVE
Hep A IgM: NEGATIVE
Hepatitis B Surface Ag: NEGATIVE

## 2015-07-22 MED ORDER — SODIUM CHLORIDE 0.9 % IV BOLUS (SEPSIS)
1000.0000 mL | Freq: Once | INTRAVENOUS | Status: AC
  Administered 2015-07-22: 1000 mL via INTRAVENOUS

## 2015-07-22 MED ORDER — LOPERAMIDE HCL 2 MG PO CAPS
2.0000 mg | ORAL_CAPSULE | Freq: Four times a day (QID) | ORAL | Status: DC
  Administered 2015-07-22 – 2015-08-04 (×28): 2 mg via ORAL
  Filled 2015-07-22 (×29): qty 1

## 2015-07-22 MED ORDER — POTASSIUM CHLORIDE 10 MEQ/100ML IV SOLN
10.0000 meq | INTRAVENOUS | Status: AC
  Administered 2015-07-22 (×2): 10 meq via INTRAVENOUS
  Filled 2015-07-22 (×2): qty 100

## 2015-07-22 NOTE — Progress Notes (Signed)
Patient had ekin pouch noted to be leaking. Pouch changed by nurse Reginold Agent and hooked back to low wall suction. Patient had 600 dark green bile in cannister and large amount that had leaked at bottom of pouch onto bed. Patient was cleaned and resting comfortably after pouch change. [facility] [employer]

## 2015-07-22 NOTE — Consult Note (Addendum)
WOC follow-up: Current middle abd Eakin pouch in place with good seal, EMR states the pouch to abd fistula was changed early this am during the nightshift by the staff nurse.  Large amt green drainage in the cannister with red rubber tube in the bag to cont low wall suction. Supplies at the bedside and instructions have been provided for staff if leakage occurs. Julien Girt MSN, RN, [place], [place], [place]

## 2015-07-22 NOTE — Progress Notes (Signed)
Patient ID: Marissa [name], female   DOB: Aug 01, 1941, 74 y.o.   MRN: [id number]     [facility]      [address] [place], [place] [address]    Phone: [phone] FAX: [phone]     Subjective: Nodded yes and no, but did not want to speak.  sCr improved.  1663m bilious output from ECF.  Did not get OOB yesterday.   Objective:  Vital signs:  Filed Vitals:   07/21/15 1000 07/21/15 1200 07/21/15 2148 07/22/15 0527  BP: 104/50 118/51 118/39 114/78  Pulse: 102 96 91 99  Temp: 98.2 F (36.8 C) 98.9 F (37.2 C) 97.7 F (36.5 C) 97.9 F (36.6 C)  TempSrc: Oral Oral Oral Oral  Resp: _0 Height:      Weight:   99.202 kg (218 lb 11.2 oz) 103.239 kg (227 lb 9.6 oz)  SpO2: 100% 97% 98% 99%    Last BM Date: 07/18/15  Intake/Output   Yesterday:  11/08 0701 - 11/09 0700 In: 2231.3 [I.V.:2231.3] Out: 1600  This shift:    I/O last 3 completed shifts: In: 2231.3 [I.V.:2231.3] Out: 1600 [Other:1600]    Physical Exam: General: Pt awake/alert/oriented x4 in no acute distress  Abdomen: Soft.  Nondistended.  Non tender.  Midline wound/ECF with bilious output.    Problem List:   Principal Problem:   Acute on chronic renal failure (HCC) Active Problems:   OSA (obstructive sleep apnea)   Essential hypertension   Chronic kidney disease, stage 3   Chronic diastolic CHF (congestive heart failure) (HCC)   Enterocutaneous fistula   Type 2 diabetes mellitus without complication (HCC)   Hyponatremia   Dehydration   Metabolic acidosis   Urinary tract infection   Oral thrush    Results:   Labs: Results for orders placed or performed during the hospital encounter of 07/13/15 (from the past 48 hour(s))  Vancomycin, trough     Status: Abnormal   Collection Time: 07/20/15 10:15 AM  Result Value Ref Range   Vancomycin Tr 35 (HH) 10.0 - 20.0 ug/mL    Comment: CRITICAL RESULT CALLED TO, READ BACK BY AND VERIFIED  WITH: SANDERS J RN 07/20/15 1156 COSTELLO B   Comprehensive metabolic panel     Status: Abnormal   Collection Time: 07/20/15 10:20 AM  Result Value Ref Range   Sodium 145 135 - 145 mmol/L    Comment: DELTA CHECK NOTED   Potassium 2.9 (L) 3.5 - 5.1 mmol/L   Chloride 98 (L) 101 - 111 mmol/L   CO2 33 (H) 22 - 32 mmol/L   Glucose, Bld 164 (H) 65 - 99 mg/dL   BUN 138 (H) 6 - 20 mg/dL   Creatinine, Ser 3.70 (H) 0.44 - 1.00 mg/dL   Calcium 9.3 8.9 - 10.3 mg/dL   Total Protein 7.6 6.5 - 8.1 g/dL   Albumin 2.8 (L) 3.5 - 5.0 g/dL   AST 99 (H) 15 - 41 U/L   ALT 135 (H) 14 - 54 U/L   Alkaline Phosphatase 174 (H) 38 - 126 U/L   Total Bilirubin 0.9 0.3 - 1.2 mg/dL   GFR calc non Af Amer 11 (L) >60 mL/min   GFR calc Af Amer 13 (L) >60 mL/min    Comment: (NOTE) The eGFR has been calculated using the CKD EPI equation. This calculation has not been validated in all clinical situations. eGFR's persistently <60 mL/min signify possible Chronic Kidney Disease.  Anion gap 14 5 - 15  Magnesium     Status: None   Collection Time: 07/20/15 10:20 AM  Result Value Ref Range   Magnesium 2.1 1.7 - 2.4 mg/dL  Phosphorus     Status: None   Collection Time: 07/20/15 10:20 AM  Result Value Ref Range   Phosphorus 3.6 2.5 - 4.6 mg/dL  CBC     Status: Abnormal   Collection Time: 07/20/15 10:20 AM  Result Value Ref Range   WBC 7.9 4.0 - 10.5 K/uL   RBC 4.05 3.87 - 5.11 MIL/uL   Hemoglobin 9.8 (L) 12.0 - 15.0 g/dL   HCT 28.6 (L) 36.0 - 46.0 %   MCV 70.6 (L) 78.0 - 100.0 fL   MCH 24.2 (L) 26.0 - 34.0 pg   MCHC 34.3 30.0 - 36.0 g/dL   RDW 14.6 11.5 - 15.5 %   Platelets 242 150 - 400 K/uL  Differential     Status: None   Collection Time: 07/20/15 10:20 AM  Result Value Ref Range   Neutrophils Relative % 68 %   Lymphocytes Relative 13 %   Monocytes Relative 12 %   Eosinophils Relative 6 %   Basophils Relative 1 %   Neutro Abs 5.4 1.7 - 7.7 K/uL   Lymphs Abs 1.0 0.7 - 4.0 K/uL   Monocytes Absolute  0.9 0.1 - 1.0 K/uL   Eosinophils Absolute 0.5 0.0 - 0.7 K/uL   Basophils Absolute 0.1 0.0 - 0.1 K/uL   RBC Morphology STOMATOCYTES   Prealbumin     Status: None   Collection Time: 07/20/15 10:20 AM  Result Value Ref Range   Prealbumin 35.1 18 - 38 mg/dL  Triglycerides     Status: None   Collection Time: 07/20/15 10:20 AM  Result Value Ref Range   Triglycerides 84 <150 mg/dL  Glucose, capillary     Status: Abnormal   Collection Time: 07/20/15 11:39 AM  Result Value Ref Range   Glucose-Capillary 130 (H) 65 - 99 mg/dL  Glucose, capillary     Status: Abnormal   Collection Time: 07/20/15  4:29 PM  Result Value Ref Range   Glucose-Capillary 146 (H) 65 - 99 mg/dL  Culture, blood (routine x 2)     Status: None (Preliminary result)   Collection Time: 07/20/15  8:04 PM  Result Value Ref Range   Specimen Description BLOOD LEFT HAND    Special Requests IN PEDIATRIC BOTTLE 1CC    Culture NO GROWTH < 24 HOURS    Report Status PENDING   Glucose, capillary     Status: Abnormal   Collection Time: 07/20/15  9:22 PM  Result Value Ref Range   Glucose-Capillary 122 (H) 65 - 99 mg/dL  Vancomycin, random     Status: None   Collection Time: 07/21/15  6:10 AM  Result Value Ref Range   Vancomycin Rm 32 ug/mL    Comment:        Random Vancomycin therapeutic range is dependent on dosage and time of specimen collection. A peak range is 20.0-40.0 ug/mL A trough range is 5.0-15.0 ug/mL          Basic metabolic panel     Status: Abnormal   Collection Time: 07/21/15  6:10 AM  Result Value Ref Range   Sodium 147 (H) 135 - 145 mmol/L   Potassium 3.0 (L) 3.5 - 5.1 mmol/L   Chloride 94 (L) 101 - 111 mmol/L   CO2 39 (H) 22 - 32 mmol/L   Glucose,  Bld 162 (H) 65 - 99 mg/dL   BUN 103 (H) 6 - 20 mg/dL   Creatinine, Ser 3.23 (H) 0.44 - 1.00 mg/dL   Calcium 8.4 (L) 8.9 - 10.3 mg/dL   GFR calc non Af Amer 13 (L) >60 mL/min   GFR calc Af Amer 15 (L) >60 mL/min    Comment: (NOTE) The eGFR has been  calculated using the CKD EPI equation. This calculation has not been validated in all clinical situations. eGFR's persistently <60 mL/min signify possible Chronic Kidney Disease.    Anion gap 14 5 - 15  Glucose, capillary     Status: Abnormal   Collection Time: 07/21/15  7:43 AM  Result Value Ref Range   Glucose-Capillary 144 (H) 65 - 99 mg/dL  Glucose, capillary     Status: None   Collection Time: 07/21/15 12:42 PM  Result Value Ref Range   Glucose-Capillary 88 65 - 99 mg/dL  Glucose, capillary     Status: Abnormal   Collection Time: 07/21/15  4:51 PM  Result Value Ref Range   Glucose-Capillary 129 (H) 65 - 99 mg/dL   Comment 1 Notify RN   Hepatitis panel, acute     Status: None   Collection Time: 07/21/15  7:11 PM  Result Value Ref Range   Hepatitis B Surface Ag Negative Negative   HCV Ab 0.1 0.0 - 0.9 s/co ratio    Comment: (NOTE)                                  Negative:     < 0.8                             Indeterminate: 0.8 - 0.9                                  Positive:     > 0.9 The CDC recommends that a positive HCV antibody result be followed up with a HCV Nucleic Acid Amplification test ([id number]). Performed At: [facility] [place], [place] [id number] Lindon Romp MD MV:[id number]    Hep A IgM Negative Negative   Hep B C IgM Negative Negative  Glucose, capillary     Status: Abnormal   Collection Time: 07/21/15  8:04 PM  Result Value Ref Range   Glucose-Capillary 123 (H) 65 - 99 mg/dL  Basic metabolic panel     Status: Abnormal   Collection Time: 07/22/15  3:52 AM  Result Value Ref Range   Sodium 147 (H) 135 - 145 mmol/L   Potassium 3.4 (L) 3.5 - 5.1 mmol/L   Chloride 99 (L) 101 - 111 mmol/L   CO2 34 (H) 22 - 32 mmol/L   Glucose, Bld 134 (H) 65 - 99 mg/dL   BUN 76 (H) 6 - 20 mg/dL   Creatinine, Ser 3.08 (H) 0.44 - 1.00 mg/dL   Calcium 8.5 (L) 8.9 - 10.3 mg/dL   GFR calc non Af Amer 14 (L) >60 mL/min   GFR calc Af Amer 16 (L)  >60 mL/min    Comment: (NOTE) The eGFR has been calculated using the CKD EPI equation. This calculation has not been validated in all clinical situations. eGFR's persistently <60 mL/min signify possible Chronic Kidney Disease.    Anion gap 14 5 -  15  Hepatic function panel     Status: Abnormal   Collection Time: 07/22/15  3:52 AM  Result Value Ref Range   Total Protein 6.9 6.5 - 8.1 g/dL   Albumin 2.6 (L) 3.5 - 5.0 g/dL   AST 93 (H) 15 - 41 U/L   ALT 161 (H) 14 - 54 U/L   Alkaline Phosphatase 146 (H) 38 - 126 U/L   Total Bilirubin 0.9 0.3 - 1.2 mg/dL   Bilirubin, Direct 0.3 0.1 - 0.5 mg/dL   Indirect Bilirubin 0.6 0.3 - 0.9 mg/dL  Glucose, capillary     Status: Abnormal   Collection Time: 07/22/15  7:42 AM  Result Value Ref Range   Glucose-Capillary 134 (H) 65 - 99 mg/dL    Imaging / Studies: US Abdomen Complete  07/21/2015  CLINICAL DATA:  Elevated liver function studies. EXAM: ULTRASOUND ABDOMEN COMPLETE COMPARISON:  Multiple prior ultrasound and CT examinations. FINDINGS: Gallbladder: Surgically absent Common bile duct: Diameter: 7.1 mm Liver: Normal echogenicity without focal lesion or biliary dilatation. IVC: Normal caliber Pancreas: Poorly visualized but normal on recent CT scans. Spleen: Normal size.  No focal lesions. Right Kidney: Length: 10.4 cm. Mild renal cortical thinning but no mass or hydronephrosis. Left Kidney: Length: 10.3 cm. Mild renal cortical thinning but no mass or hydronephrosis. Abdominal aorta: Normal caliber Other findings: None. IMPRESSION: Status post cholecystectomy with mild associated common bile duct dilatation. Normal sonographic appearance of the liver. No intrahepatic biliary dilatation. Poor visualization of the pancreas but it was normal on recent CT scans. Electronically Signed   By: Marijo Sanes M.D.   On: 07/21/2015 23:43   Dg Ugi W/small Bowel  07/20/2015  CLINICAL DATA:  74 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 Prior repair.  Question level of fistula. Subsequent encounter. EXAM: UPPER GI SERIES WITH SMALL BOWEL FOLLOW-THROUGH FLUOROSCOPY TIME:  Radiation Exposure Index (as provided by the fluoroscopic device): 1634.12 micro Gy TECHNIQUE: Water-soluble upper GI series followed by small bowel were obtained after discussion with Dr. Kieth Brightly. COMPARISON:  None. FINDINGS: Draining clips are noted on scout view. Patient was only able to ingest a small amount of Omnipaque 300 at one time. Total of 150 cc was ingested. The present examination was specifically tailored to evaluate for level of the enterocutaneous fistula. Gastroesophageal reflux noted. Patient was kept in the upright position to avoid aspiration. Scattered duodenal diverticulum. Nonspecific eggshell calcification right upper quadrant. Enterocutaneous fistulas best seen on lateral overhead imaging. This appears to arise from the proximal to mid ileum. Distal aspect of the small bowel not visualized. Patient was tired of the exam at this point. Study terminated. IMPRESSION: Enterocutaneous fistulas best seen on lateral overhead imaging. This appears to arise from the proximal to mid ileum. Exam otherwise limited as noted above. Electronically Signed   By: Genia Del M.D.   On: 07/20/2015 15:46    Medications / Allergies:  Scheduled Meds: . heparin  5,000 Units Subcutaneous 3 times per day  . insulin aspart  0-9 Units Subcutaneous 4 times per day  . loperamide  2 mg Oral Q12H  . nystatin  5 mL Oral QID  . octreotide  100 mcg Subcutaneous Q12H  . pantoprazole (PROTONIX) IV  40 mg Intravenous Q24H  . sodium chloride  1,000 mL Intravenous Once   Continuous Infusions: . dextrose 5 % and 0.45% NaCl 125 mL/hr (07/22/15 0405)   PRN Meds:.diphenhydrAMINE, Glycerin (Adult), iohexol, morphine injection, nitroGLYCERIN, ondansetron **OR** ondansetron (ZOFRAN) IV, oxyCODONE, sodium chloride  Antibiotics: Anti-infectives  Start     Dose/Rate Route Frequency Ordered  Stop   07/16/15 1000  vancomycin (VANCOCIN) 1,500 mg in sodium chloride 0.9 % 500 mL IVPB  Status:  Discontinued     1,500 mg 250 mL/hr over 120 Minutes Intravenous Every 48 hours 07/14/15 0956 07/15/15 1035   07/16/15 1000  vancomycin (VANCOCIN) IVPB 1000 mg/200 mL premix  Status:  Discontinued     1,000 mg 200 mL/hr over 60 Minutes Intravenous Every 48 hours 07/15/15 1035 07/20/15 1242   07/14/15 1500  cefTRIAXone (ROCEPHIN) 1 g in dextrose 5 % 50 mL IVPB  Status:  Discontinued     1 g 100 mL/hr over 30 Minutes Intravenous Every 24 hours 07/13/15 1647 07/17/15 1113   07/14/15 1015  vancomycin (VANCOCIN) 1,500 mg in sodium chloride 0.9 % 500 mL IVPB     1,500 mg 250 mL/hr over 120 Minutes Intravenous NOW 07/14/15 0943 07/14/15 1210   07/13/15 1400  cefTRIAXone (ROCEPHIN) 1 g in dextrose 5 % 50 mL IVPB     1 g 100 mL/hr over 30 Minutes Intravenous  Once 07/13/15 1359 07/13/15 1526        Assessment/Plan ECF-eakin's pouch, appreciate WOC management. -accurate I&Os -sandostatin/loperamide to slow down transit, increase to 73m q6h -cont strict NPO as output is still relatively high. Was 1600cc yesterday.  Bolus 1L IVF -SBFT did show EC fistula to be fairly distal in the proximal to mid ileum ID-BCs with GPC. repeat Cx 11/7 are negative to date. If negative, replace PICC and restart TNA FEN-NPO PCM- on picc holiday so no new nutrition right now. Latest prealbumin is 35 Acute on chronic renal failure- per medicine and renal, Cr 3.08 yesterday VTE prophylaxis-SCD/heparin   EErby Pian [facility] Pager [phone](7A-4:30P) For consults and floor pages call [phone](7A-4:30P)  07/22/2015 8:32 AM

## 2015-07-22 NOTE — Progress Notes (Signed)
[facility]                                                                              Patient Demographics  Marissa [name], is a 74 y.o. female, DOB - [birth year], GDJ:[id number]  Admit date - 07/13/2015   Admitting Physician Waldemar Dickens, MD  Outpatient Primary MD for the patient is [name], MD  LOS - 9   Chief Complaint  Patient presents with  . Emesis       Brief HPI    74 y.o. female, with chronic kidney disease, obstructive sleep apnea, diastolic heart failure, type 2 diabetes, multiple abdominal surgeries, and a recent discharge after care of an enterocutaneous fistula, presented to the emergency department from [facility] with lethargy, 2 days of vomiting and severe dehydration. On 04/28/15 the patient underwent abdominal wound exploration of enterocutaneous fistula and abscess by Dr. Georgette Dover. She was admitted to the hospital on 9/22 with increased ostomy output and confusion. She was treated with TPN and Sandostatin. An eakin pouch was placed. Once she stabilized she was discharged on 10/4 to SNF. Marland Kitchen Per the patient's daughter, Dr. Georgette Dover felt the patient could begin to take oral food & drink approximately 2 weeks ago. The patient's TPN was discontinued at some point after that. The patient ate fairly well for 4-5 days but then decreased her oral intake. One week back patient's enterocutaneous fistula began to drain green bile once again. Her stools became the color of green bile. Yesterday prior to admission patient had several episodes of biliary vomiting. In the emergency department the patient's bicarbonate was 12, her creatinine is elevated to 3.44 from 1.75, her BUN is 160, sodium 129. Lactic acid was normal. UA positive for UTI. Patient admitted to hospitalist service and general surgery following.  Assessment & Plan   Acute on chronic kidney injury with metabolic acidosis - prerenal secondary to dehydration. Has underlying chronic  kidney disease stage III.  - Not improved with gentle hydration. Renal ultrasound unremarkable. Appreciate renal consult.  -Given Ringer's lactate followed by D5 with bicarbonate and now getting D5 with half-normal saline as bicarbonate has significantly improved. Renal output and AKI improving - Discontinued indomethacin, colchicine. Avoid all nephrotoxic agents. - not a candidate for HD.   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 surgery following. - NPO, TNA currently on hold due to line bacteremia, PICC line off  - Eakin pounds placed to intermittent wall suction. Wound care following. - Continue Sandostatin.  Coag negative staph bacteremia with sepsis 2/2 on admission - Placed on empiric IV vancomycin. Repeat blood culture from 11/3 still positive for coag-negative bacteremia. PICC line discontinued and repeat blood culture sent.  - Dr Clementeen Graham  discussed with ID, Dr. Linus Salmons. Recommend 2 week course of IV vancomycin. ( stop date 11/14; Vancomycin level currently supratherapeutic).  -Follow blood cultures, if negative, will place PICC line in a.m.  Proteus UTI Pansensitive. completed 5 days of empiric Rocephin.  transaminitis  LFTs improving Abdominal ultrasound showed mild associated CBD dilatation, cholecystectomy, normal sonographic appearance of liver, no intrahepatic dilatation.  Acute encephalopathy -  Possible secondary to sepsis and dehydration.  -Appears improved but has flattened affect.  Hypokalemia Replace IV  Hyponatremia improved  Oral thrush Continue nystatin  Chronic diastolic CHF EF of 97%. Currently hypovolemic and on gentle hydration.  Obstructive sleep apnea Continue bedtime CPAP  Prediabetes monitor with SSI  Severe malnutrition with failure to thrive Holding TPN due to persistent bacteremia .   OSA On CPAP. Pt refusing to use.  Code Status:DO NOT RESUSCITATE  family  Communication: Discussed in detail with the patient, all imaging results, lab results explained to the patient   Disposition Plan: not medically ready  Time Spent in minutes  25 minutes  Procedures  CT abd   Consults Nephrology General surgery  ID  DVT Prophylaxis heparin subcutaneous   Medications  Scheduled Meds: . heparin  5,000 Units Subcutaneous 3 times per day  . insulin aspart  0-9 Units Subcutaneous 4 times per day  . loperamide  2 mg Oral 4 times per day  . nystatin  5 mL Oral QID  . octreotide  100 mcg Subcutaneous Q12H  . pantoprazole (PROTONIX) IV  40 mg Intravenous Q24H  . sodium chloride  1,000 mL Intravenous Once   Continuous Infusions: . dextrose 5 % and 0.45% NaCl 125 mL/hr at 07/22/15 1159   PRN Meds:.diphenhydrAMINE, Glycerin (Adult), iohexol, morphine injection, nitroGLYCERIN, ondansetron **OR** ondansetron (ZOFRAN) IV, oxyCODONE, sodium chloride   Antibiotics   Anti-infectives    Start     Dose/Rate Route Frequency Ordered Stop   07/16/15 1000  vancomycin (VANCOCIN) 1,500 mg in sodium chloride 0.9 % 500 mL IVPB  Status:  Discontinued     1,500 mg 250 mL/hr over 120 Minutes Intravenous Every 48 hours 07/14/15 0956 07/15/15 1035   07/16/15 1000  vancomycin (VANCOCIN) IVPB 1000 mg/200 mL premix  Status:  Discontinued     1,000 mg 200 mL/hr over 60 Minutes Intravenous Every 48 hours 07/15/15 1035 07/20/15 1242   07/14/15 1500  cefTRIAXone (ROCEPHIN) 1 g in dextrose 5 % 50 mL IVPB  Status:  Discontinued     1 g 100 mL/hr over 30 Minutes Intravenous Every 24 hours 07/13/15 1647 07/17/15 1113   07/14/15 1015  vancomycin (VANCOCIN) 1,500 mg in sodium chloride 0.9 % 500 mL IVPB     1,500 mg 250 mL/hr over 120 Minutes Intravenous NOW 07/14/15 0943 07/14/15 1210   07/13/15 1400  cefTRIAXone (ROCEPHIN) 1 g in dextrose 5 % 50 mL IVPB     1 g 100 mL/hr over 30 Minutes Intravenous  Once 07/13/15 1359 07/13/15 1526        Subjective:   Marissa [name]  was seen and examined today.Flat affect, denies any specific complaints. Patient denies dizziness, chest pain, shortness of breath, new weakness, numbess, tingling. No acute events overnight.   mostly says yes and no to the questions  Objective:   Blood pressure 114/78, pulse 99, temperature 97.9 F (36.6 C), temperature source Oral, resp. rate 18, height 5\' 4"  (1.626 m), weight 103.239 kg (227 lb 9.6 oz), SpO2 99 %.  Wt Readings from Last 3 Encounters:  07/22/15 103.239 kg (227 lb 9.6 oz)  06/15/15 102.7 kg (226 lb 6.6 oz)  05/01/15 99.3 kg (218 lb 14.7 oz)     Intake/Output Summary (Last 24 hours) at 07/22/15 1255 Last data filed at 07/22/15 0925  Gross per 24 hour  Intake 2231.25 ml  Output   1100 ml  Net 1131.25 ml    Exam  General: Alert  NAD,  flat affect  HEENT:  PERRLA, EOMI, Anicteric Sclera, mucous membranes moist.   Neck: Supple, no JVD, no masses  CVS: S1 S2 auscultated, no rubs, murmurs or gallops. Regular rate and rhythm.  Respiratory: Clear to auscultation bilaterally, no wheezing, rales or rhonchi  Abdomen: Soft, midline wound, eakin pouch to wall suction   Ext: no cyanosis clubbing or edema  Neuro: no new deficits  Skin: No rashes  Psych: Normal affect and demeanor, alert    Data Review   Micro Results Recent Results (from the past 240 hour(s))  Culture, Urine     Status: None   Collection Time: 07/13/15 12:14 PM  Result Value Ref Range Status   Specimen Description URINE, RANDOM  Final   Special Requests ADDED 2258  Final   Culture >=100,000 COLONIES/mL PROTEUS MIRABILIS  Final   Report Status 07/16/2015 FINAL  Final   Organism ID, Bacteria PROTEUS MIRABILIS  Final      Susceptibility   Proteus mirabilis - MIC*    AMPICILLIN <=2 SENSITIVE Sensitive     CEFAZOLIN <=4 SENSITIVE Sensitive     CEFTRIAXONE <=1 SENSITIVE Sensitive     CIPROFLOXACIN <=0.25 SENSITIVE Sensitive     GENTAMICIN <=1 SENSITIVE Sensitive     IMIPENEM 4 SENSITIVE  Sensitive     NITROFURANTOIN 128 RESISTANT Resistant     TRIMETH/SULFA <=20 SENSITIVE Sensitive     AMPICILLIN/SULBACTAM <=2 SENSITIVE Sensitive     PIP/TAZO <=4 SENSITIVE Sensitive     * >=100,000 COLONIES/mL PROTEUS MIRABILIS  Blood culture (routine x 2)     Status: None   Collection Time: 07/13/15  2:50 PM  Result Value Ref Range Status   Specimen Description BLOOD PICC LINE  Final   Special Requests BOTTLES DRAWN AEROBIC AND ANAEROBIC 5ML  Final   Culture  Setup Time   Final    GRAM POSITIVE COCCI IN CLUSTERS IN BOTH AEROBIC AND ANAEROBIC BOTTLES CRITICAL RESULT CALLED TO, READ BACK BY AND VERIFIED WITH: TO BQIU(RN) BY TCLEVELAND 07/14/2015 AT 5:58 AM    Culture STAPHYLOCOCCUS SPECIES (COAGULASE NEGATIVE)  Final   Report Status 07/16/2015 FINAL  Final   Organism ID, Bacteria STAPHYLOCOCCUS SPECIES (COAGULASE NEGATIVE)  Final      Susceptibility   Staphylococcus species (coagulase negative) - MIC*    CIPROFLOXACIN <=0.5 SENSITIVE Sensitive     ERYTHROMYCIN <=0.25 SENSITIVE Sensitive     GENTAMICIN <=0.5 SENSITIVE Sensitive     OXACILLIN >=4 RESISTANT Resistant     TETRACYCLINE 2 SENSITIVE Sensitive     VANCOMYCIN 2 SENSITIVE Sensitive     TRIMETH/SULFA <=10 SENSITIVE Sensitive     CLINDAMYCIN >=8 RESISTANT Resistant     RIFAMPIN <=0.5 SENSITIVE Sensitive     Inducible Clindamycin NEGATIVE Sensitive     * STAPHYLOCOCCUS SPECIES (COAGULASE NEGATIVE)  Blood culture (routine x 2)     Status: None   Collection Time: 07/13/15  2:55 PM  Result Value Ref Range Status   Specimen Description BLOOD LEFT HAND  Final   Special Requests   Final    BOTTLES DRAWN AEROBIC AND ANAEROBIC BLUE 5CC, RED 3CC   Culture  Setup Time   Final    GRAM POSITIVE COCCI IN CLUSTERS AEROBIC BOTTLE ONLY CRITICAL RESULT CALLED TO, READ BACK BY AND VERIFIED WITH: J NARAMDAS,RN AT 1122 07/14/15 BY L BENFIELD    Culture   Final    STAPHYLOCOCCUS SPECIES (COAGULASE NEGATIVE) SUSCEPTIBILITIES PERFORMED ON  PREVIOUS CULTURE WITHIN THE LAST 5 DAYS.    Report  Status 07/16/2015 FINAL  Final  Culture, blood (routine x 2)     Status: None   Collection Time: 07/16/15  5:10 AM  Result Value Ref Range Status   Specimen Description BLOOD LEFT HAND  Final   Special Requests IN PEDIATRIC BOTTLE 2.5CC  Final   Culture  Setup Time   Final    GRAM POSITIVE COCCI IN CLUSTERS AEROBIC BOTTLE ONLY CRITICAL RESULT CALLED TO, READ BACK BY AND VERIFIED WITH: MIKKI MURPHY @0541  07/17/15 MKELLY    Culture   Final    STAPHYLOCOCCUS SPECIES (COAGULASE NEGATIVE) SUSCEPTIBILITIES PERFORMED ON PREVIOUS CULTURE WITHIN THE LAST 5 DAYS.    Report Status 07/18/2015 FINAL  Final  Culture, blood (routine x 2)     Status: None   Collection Time: 07/16/15  5:23 AM  Result Value Ref Range Status   Specimen Description BLOOD LEFT HAND  Final   Special Requests IN PEDIATRIC BOTTLE 2CC  Final   Culture NO GROWTH 5 DAYS  Final   Report Status 07/21/2015 FINAL  Final  Culture, blood (routine x 2)     Status: None (Preliminary result)   Collection Time: 07/20/15  8:04 PM  Result Value Ref Range Status   Specimen Description BLOOD LEFT HAND  Final   Special Requests IN PEDIATRIC BOTTLE 1CC  Final   Culture NO GROWTH 2 DAYS  Final   Report Status PENDING  Incomplete  Culture, blood (single)     Status: None (Preliminary result)   Collection Time: 07/21/15  6:10 AM  Result Value Ref Range Status   Specimen Description BLOOD LEFT HAND  Final   Special Requests IN PEDIATRIC BOTTLE  1CC  Final   Culture NO GROWTH 1 DAY  Final   Report Status PENDING  Incomplete    Radiology Reports Ct Abdomen Pelvis Wo Contrast  07/13/2015  CLINICAL DATA:  Subsequent encounter for nausea vomiting with low abdominal pain and increased drainage from fistula. EXAM: CT ABDOMEN AND PELVIS WITHOUT CONTRAST TECHNIQUE: Multidetector CT imaging of the abdomen and pelvis was performed following the standard protocol without IV contrast. COMPARISON:   06/04/2015. FINDINGS: Lower chest: Motion artifact with some atelectasis in the dependent bases. Hepatobiliary: No focal abnormality in the liver on this study without intravenous contrast. No evidence of hepatomegaly. Gallbladder is surgically absent. No intrahepatic or extrahepatic biliary dilation. Pancreas: No focal mass lesion. No dilatation of the main duct. No intraparenchymal cyst. No peripancreatic edema. Spleen: No splenomegaly. No focal mass lesion. Adrenals/Urinary Tract: No adrenal nodule or mass. No mass lesion evident in either kidney on this study without intravenous contrast material. No evidence for hydroureteronephrosis. The urinary bladder appears normal for the degree of distention. Stomach/Bowel: Small hiatal hernia noted. Metallic foreign body identified along the posterior wall of the gastric fundus. This is been present on multiple prior studies is well. Duodenum is normally positioned as is the ligament of Treitz. No small bowel wall thickening. No small bowel dilatation. The terminal ileum is normal. The appendix is not visualized, but there is no edema or inflammation in the region of the cecum. Diverticuli are seen scattered along the entire length of the colon without CT findings of diverticulitis. Small bowel adhesions to the anterior abdominal wall again noted. There is soft tissue attenuation in the midline anterior abdominal wall, at the site of previous surgical wound. This is the area where enterocutaneous fistula was visualized on the previous study. No gas in this region today but no oral contrast was administered  to allow assessment of the fistula. Vascular/Lymphatic: There is abdominal aortic atherosclerosis without aneurysm. There is no gastrohepatic or hepatoduodenal ligament lymphadenopathy. No intraperitoneal or retroperitoneal lymphadenopy. No pelvic sidewall lymphadenopathy. Reproductive: Uterus is surgically absent. There is no adnexal mass. Other: No intraperitoneal  free fluid. Musculoskeletal: Bone windows reveal no worrisome lytic or sclerotic osseous lesions. IMPRESSION: Stable exam. No new or acute interval findings. There is no gas in the region of the chronic enterocutaneous fistula no evidence to suggest interval development of an abscess in this region. Fistula not well assessed given the lack of oral contrast on today's study. No evidence for bowel obstruction. No intraperitoneal fluid collection and no intraperitoneal free fluid. Electronically Signed   By: Misty Stanley M.D.   On: 07/13/2015 13:27   Dg Chest 2 View  07/13/2015  CLINICAL DATA:  Emesis.  CHF. EXAM: CHEST  2 VIEW COMPARISON:  06/04/2015 chest radiograph FINDINGS: Right PICC terminates in the lower third of the superior vena cava. Stable cardiomediastinal silhouette with mild cardiomegaly. No pneumothorax. No pleural effusion. Clear lungs, with no focal lung consolidation and no pulmonary edema. IMPRESSION: Stable mild cardiomegaly without pulmonary edema. Lungs appear clear. Electronically Signed   By: Ilona Sorrel M.D.   On: 07/13/2015 12:43   US Abdomen Complete  07/21/2015  CLINICAL DATA:  Elevated liver function studies. EXAM: ULTRASOUND ABDOMEN COMPLETE COMPARISON:  Multiple prior ultrasound and CT examinations. FINDINGS: Gallbladder: Surgically absent Common bile duct: Diameter: 7.1 mm Liver: Normal echogenicity without focal lesion or biliary dilatation. IVC: Normal caliber Pancreas: Poorly visualized but normal on recent CT scans. Spleen: Normal size.  No focal lesions. Right Kidney: Length: 10.4 cm. Mild renal cortical thinning but no mass or hydronephrosis. Left Kidney: Length: 10.3 cm. Mild renal cortical thinning but no mass or hydronephrosis. Abdominal aorta: Normal caliber Other findings: None. IMPRESSION: Status post cholecystectomy with mild associated common bile duct dilatation. Normal sonographic appearance of the liver. No intrahepatic biliary dilatation. Poor visualization of  the pancreas but it was normal on recent CT scans. Electronically Signed   By: Marijo Sanes M.D.   On: 07/21/2015 23:43   US Renal  07/17/2015  CLINICAL DATA:  Renal insufficiency EXAM: RENAL ULTRASOUND COMPARISON:  Renal ultrasound June 08, 2015; CT abdomen and pelvis July 13, 2015 FINDINGS: Right Kidney: Length: 11.1 cm. Echogenicity and renal cortical thickness are within normal limits. No perinephric fluid or hydronephrosis visualized. There is a cyst in the upper pole region measuring 1.5 x 1.5 x 1.5 cm. No sonographically demonstrable calculus or ureterectasis. Left Kidney: Length: 10.8 cm. Echogenicity and renal cortical thickness are within normal limits. No mass, perinephric fluid, or hydronephrosis visualized. No sonographically demonstrable calculus. Bladder: Completely empty and cannot be assessed. IMPRESSION: Cyst arising from upper pole right kidney. Study otherwise unremarkable. Note the urinary bladder is empty and cannot be assessed. Electronically Signed   By: Lowella Grip III M.D.   On: 07/17/2015 11:45   Dg Ugi W/small Bowel  07/20/2015  CLINICAL DATA:  74 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 Prior repair. Question level of fistula. Subsequent encounter. EXAM: UPPER GI SERIES WITH SMALL BOWEL FOLLOW-THROUGH FLUOROSCOPY TIME:  Radiation Exposure Index (as provided by the fluoroscopic device): 1634.12 micro Gy TECHNIQUE: Water-soluble upper GI series followed by small bowel were obtained after discussion with Dr. Kieth Brightly. COMPARISON:  None. FINDINGS: Draining clips are noted on scout view. Patient was only able to ingest a small amount of Omnipaque 300 at one time. Total of 150 cc  was ingested. The present examination was specifically tailored to evaluate for level of the enterocutaneous fistula. Gastroesophageal reflux noted. Patient was kept in the upright position to avoid aspiration. Scattered duodenal diverticulum. Nonspecific eggshell calcification right  upper quadrant. Enterocutaneous fistulas best seen on lateral overhead imaging. This appears to arise from the proximal to mid ileum. Distal aspect of the small bowel not visualized. Patient was tired of the exam at this point. Study terminated. IMPRESSION: Enterocutaneous fistulas best seen on lateral overhead imaging. This appears to arise from the proximal to mid ileum. Exam otherwise limited as noted above. Electronically Signed   By: Genia Del M.D.   On: 07/20/2015 15:46    CBC  Recent Labs Lab 07/19/15 0415 07/20/15 1020  WBC 7.1 7.9  HGB 10.3* 9.8*  HCT 30.6* 28.6*  PLT 251 242  MCV 71.8* 70.6*  MCH 24.2* 24.2*  MCHC 33.7 34.3  RDW 15.3 14.6  LYMPHSABS  --  1.0  MONOABS  --  0.9  EOSABS  --  0.5  BASOSABS  --  0.1    Chemistries   Recent Labs Lab 07/16/15 0430 07/17/15 0424 07/18/15 0940 07/19/15 0415 07/20/15 1020 07/21/15 0610 07/22/15 0352  NA 130* 127* 133* 138 145 147* 147*  K 4.0 4.2 5.5* 3.4* 2.9* 3.0* 3.4*  CL 108 106 110 104 98* 94* 99*  CO2 11* 9* 8* 18* 33* 39* 34*  GLUCOSE 167* 150* 126* 135* 164* 162* 134*  BUN 149* 156* 156* 148* 138* 103* 76*  CREATININE 3.32* 3.32* 3.48* 3.38* 3.70* 3.23* 3.08*  CALCIUM 9.4 9.4 10.0 9.6 9.3 8.4* 8.5*  MG 2.1 1.9  --   --  2.1  --   --   AST 42*  --   --   --  99*  --  93*  ALT 58*  --   --   --  135*  --  161*  ALKPHOS 172*  --   --   --  174*  --  146*  BILITOT 0.3  --   --   --  0.9  --  0.9   ------------------------------------------------------------------------------------------------------------------ estimated creatinine clearance is 18.7 mL/min (by C-G formula based on Cr of 3.08). ------------------------------------------------------------------------------------------------------------------ No results for input(s): HGBA1C in the last 72 hours. ------------------------------------------------------------------------------------------------------------------  Recent Labs  07/20/15 1020    TRIG 84   ------------------------------------------------------------------------------------------------------------------ No results for input(s): TSH, T4TOTAL, T3FREE, THYROIDAB in the last 72 hours.  Invalid input(s): FREET3 ------------------------------------------------------------------------------------------------------------------ No results for input(s): VITAMINB12, FOLATE, FERRITIN, TIBC, IRON, RETICCTPCT in the last 72 hours.  Coagulation profile No results for input(s): INR, PROTIME in the last 168 hours.  No results for input(s): DDIMER in the last 72 hours.  Cardiac Enzymes No results for input(s): CKMB, TROPONINI, MYOGLOBIN in the last 168 hours.  Invalid input(s): CK ------------------------------------------------------------------------------------------------------------------ Invalid input(s): [place]  07/21/15 0743 07/21/15 1242 07/21/15 1651 07/21/15 2004 07/22/15 0742 07/22/15 1223  GLUCAP 144* 88 129* 123* 134* 115*     RAI,RIPUDEEP M.D. [facility] 07/22/2015, 12:55 PM  Pager: [phone] Between 7am to 7pm - call Pager - [phone]  After 7pm go to [email or web address] - password [email or web address]  Call night coverage person covering after 7pm

## 2015-07-22 NOTE — Progress Notes (Signed)
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   Subjective:  Says she feels "terrible this am". Does not respond to other questioning.  IV in place. Continues without picc for now.  GFR slightly improved again this am.  I/O's with unmeasured urine output once again. ECF with 1600cc output.  Moved to 6N yesterday.    11/08 0701 - 11/09 0700 In: 2231.3 [I.V.:2231.3] Out: 1600   Filed Weights   07/20/15 2123 07/21/15 2148 07/22/15 0527  Weight: 204 lb 2.3 oz (92.6 kg) 218 lb 11.2 oz (99.202 kg) 227 lb 9.6 oz (103.239 kg)    Scheduled Meds: . heparin  5,000 Units Subcutaneous 3 times per day  . insulin aspart  0-9 Units Subcutaneous 4 times per day  . loperamide  2 mg Oral 4 times per day  . nystatin  5 mL Oral QID  . octreotide  100 mcg Subcutaneous Q12H  . pantoprazole (PROTONIX) IV  40 mg Intravenous Q24H  . sodium chloride  1,000 mL Intravenous Once   Continuous Infusions: . dextrose 5 % and 0.45% NaCl 125 mL/hr (07/22/15 0405)   PRN Meds:.diphenhydrAMINE, Glycerin (Adult), iohexol, morphine injection, nitroGLYCERIN, ondansetron **OR** ondansetron (ZOFRAN) IV, oxyCODONE, sodium chloride  Current Labs: reviewed    Physical Exam:  Blood pressure 114/78, pulse 99, temperature 97.9 F (36.6 C), temperature source Oral, resp. rate 18, height 5\' 4"  (1.626 m), weight 227 lb 9.6 oz (103.239 kg), SpO2 99 %. Not very interactive, NAD this am.  RRR, no rub Resp: CTA bilaterally.  Ext: WWP, No edema, somewhat tender suspect this is from her gout.   Eakins pouch noted.  Neuro: No asterixis this am.   A/P 1. AoCKD with profound azotemia that is improving. BUN 76 this am, Cr 3.02 down from 3.7.  1. Suspect hypovolemia + NSAID (was about 7kg below recent discharge weight). Weights here are now inaccurate.  2. Baseline SCr = 1.7s 3. Renal US  11/4 w/o obstruction  4. Ensure IV access. No PICC. Hydrated with D5 0.45% NS yesterday. Hopefully will get PICC today if Cx remain negative and restart TPN.  5. Not a dialysis candidate. Kidney function improving.   Daily weights, Daily Renal Panel, Strict I/Os, Avoid nephrotoxins (NSAIDs, judicious IV Contrast). 1. Chrnoic enterocutaneous fistula, intermittent high outpt, TPN dependency -- surgery following. Hopefully restarting TPN soon.  2. NAG metabolic acidosis: likely related to GI losses initially; Now resolved. Bicarb is 34 this am.  3. AMS: likely multifactorial including uremia. Continue hydration.    4. GPC bacteremia and Proteus UTI 1. PICC out. Hopefully will get replaced today.  2. ABX per primary. Holding Vanc this am. S/p tx of UTI with Ceftriaxone.  3. Repeat blood cx NGTD so far.  5. FTT -- goals of care / palliative care seem reasonable to explore at this time- agree  Paula Compton, MD PGY 2  Patient seen and examined, agree with above note with above modifications. Again not very interactive today.  Continues to have high output from this EC fistula- fortunately her AKI is improving - getting hydration to deep up with her output and does not seem overloaded.  Her renal function is much improved as is her BUN from initially- she is not a candidate for chronic dialysis therapy.  Hopefully numbers will continue to improve- continue with IVF as appropriate and re initiation of  TPN- Renal will sign off Corliss Parish, MD 07/22/2015            Recent Labs Lab 07/16/15 0430 07/17/15 0424  07/20/15 1020 07/21/15 0610 07/22/15 0352  NA 130* 127*  < > 145 147* 147*  K 4.0 4.2  < > 2.9* 3.0* 3.4*  CL 108 106  < > 98* 94* 99*  CO2 11* 9*  < > 33* 39* 34*  GLUCOSE 167* 150*  < > 164* 162* 134*  BUN 149* 156*  < > 138* 103* 76*  CREATININE 3.32* 3.32*  < > 3.70* 3.23* 3.08*  CALCIUM 9.4 9.4  < > 9.3 8.4* 8.5*  PHOS 4.4 3.8  --  3.6  --   --   < > = values in  this interval not displayed.  Recent Labs Lab 07/19/15 0415 07/20/15 1020  WBC 7.1 7.9  NEUTROABS  --  5.4  HGB 10.3* 9.8*  HCT 30.6* 28.6*  MCV 71.8* 70.6*  PLT 251 242

## 2015-07-23 LAB — COMPREHENSIVE METABOLIC PANEL
ALBUMIN: 2.2 g/dL — AB (ref 3.5–5.0)
ALK PHOS: 137 U/L — AB (ref 38–126)
ALT: 136 U/L — AB (ref 14–54)
AST: 70 U/L — AB (ref 15–41)
Anion gap: 11 (ref 5–15)
BILIRUBIN TOTAL: 1 mg/dL (ref 0.3–1.2)
BUN: 54 mg/dL — AB (ref 6–20)
CALCIUM: 8.5 mg/dL — AB (ref 8.9–10.3)
CO2: 33 mmol/L — AB (ref 22–32)
CREATININE: 2.92 mg/dL — AB (ref 0.44–1.00)
Chloride: 97 mmol/L — ABNORMAL LOW (ref 101–111)
GFR calc Af Amer: 17 mL/min — ABNORMAL LOW (ref >60)
GFR calc non Af Amer: 15 mL/min — ABNORMAL LOW (ref >60)
GLUCOSE: 127 mg/dL — AB (ref 65–99)
Potassium: 3.3 mmol/L — ABNORMAL LOW (ref 3.5–5.1)
SODIUM: 141 mmol/L (ref 135–145)
TOTAL PROTEIN: 6 g/dL — AB (ref 6.5–8.1)

## 2015-07-23 LAB — CBC
HEMATOCRIT: 27.7 % — AB (ref 36.0–46.0)
HEMOGLOBIN: 8.3 g/dL — AB (ref 12.0–15.0)
MCH: 24.1 pg — AB (ref 26.0–34.0)
MCHC: 30 g/dL (ref 30.0–36.0)
MCV: 80.5 fL (ref 78.0–100.0)
Platelets: 247 10*3/uL (ref 150–400)
RBC: 3.44 MIL/uL — ABNORMAL LOW (ref 3.87–5.11)
RDW: 14.3 % (ref 11.5–15.5)
WBC: 7.9 10*3/uL (ref 4.0–10.5)

## 2015-07-23 LAB — GLUCOSE, CAPILLARY
GLUCOSE-CAPILLARY: 98 mg/dL (ref 65–99)
GLUCOSE-CAPILLARY: 145 mg/dL — AB (ref 65–99)
Glucose-Capillary: 133 mg/dL — ABNORMAL HIGH (ref 65–99)
Glucose-Capillary: 111 mg/dL — ABNORMAL HIGH (ref 65–99)

## 2015-07-23 LAB — PHOSPHORUS: Phosphorus: 3.4 mg/dL (ref 2.5–4.6)

## 2015-07-23 LAB — MAGNESIUM: Magnesium: 1.6 mg/dL — ABNORMAL LOW (ref 1.7–2.4)

## 2015-07-23 LAB — VANCOMYCIN, RANDOM: VANCOMYCIN RM: 15 ug/mL

## 2015-07-23 MED ORDER — POTASSIUM CHLORIDE 10 MEQ/100ML IV SOLN
10.0000 meq | INTRAVENOUS | Status: AC
  Administered 2015-07-23 (×3): 10 meq via INTRAVENOUS
  Filled 2015-07-23: qty 100

## 2015-07-23 MED ORDER — DEXTROSE-NACL 5-0.45 % IV SOLN
INTRAVENOUS | Status: AC
  Administered 2015-07-24 (×2): via INTRAVENOUS

## 2015-07-23 MED ORDER — TRACE MINERALS CR-CU-MN-SE-ZN 10-1000-500-60 MCG/ML IV SOLN
INTRAVENOUS | Status: AC
  Administered 2015-07-23: 18:00:00 via INTRAVENOUS
  Filled 2015-07-23: qty 960

## 2015-07-23 MED ORDER — SODIUM CHLORIDE 0.9 % IV BOLUS (SEPSIS)
1000.0000 mL | Freq: Once | INTRAVENOUS | Status: AC
  Administered 2015-07-23: 1000 mL via INTRAVENOUS

## 2015-07-23 MED ORDER — FAT EMULSION 20 % IV EMUL
168.0000 mL | INTRAVENOUS | Status: AC
  Administered 2015-07-23: 168 mL via INTRAVENOUS
  Filled 2015-07-23: qty 200

## 2015-07-23 MED ORDER — VANCOMYCIN HCL 10 G IV SOLR
1250.0000 mg | INTRAVENOUS | Status: DC
  Administered 2015-07-23 – 2015-07-27 (×3): 1250 mg via INTRAVENOUS
  Filled 2015-07-23 (×3): qty 1250

## 2015-07-23 MED ORDER — SODIUM CHLORIDE 0.9 % IJ SOLN
10.0000 mL | INTRAMUSCULAR | Status: DC | PRN
  Administered 2015-07-23 – 2015-08-04 (×11): 10 mL
  Filled 2015-07-23 (×11): qty 40

## 2015-07-23 MED ORDER — MAGNESIUM SULFATE IN D5W 10-5 MG/ML-% IV SOLN
1.0000 g | Freq: Once | INTRAVENOUS | Status: AC
  Administered 2015-07-23: 1 g via INTRAVENOUS
  Filled 2015-07-23: qty 100

## 2015-07-23 NOTE — Progress Notes (Signed)
Nutrition Follow-up  DOCUMENTATION CODES:   Obesity unspecified  INTERVENTION:   -TPN management per pharmacy  NUTRITION DIAGNOSIS:   Inadequate oral intake related to inability to eat as evidenced by NPO status.  Ongoing  GOAL:   Patient will meet greater than or equal to 90% of their needs  Unmet  MONITOR:   Diet advancement, Weight trends, Labs, I & O's, Skin  REASON FOR ASSESSMENT:   Consult New TPN/TNA  ASSESSMENT:   74 y.o. 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  PICC line removed on 07/17/15, due to persistent bactremia. TPN subsequently held.   Spoke with RN, who reports plan to replace PICC line today and resume TPN today. Per pharmacy note, will restart Clinimix E 5/15 at 40 cc/hr + 20% IVFE at 7 ml/hr (which provides 1018 kcals and 48 grams of protein, which meets 60% of estimated minimum kcal needs and 48% of estimated minimum protein needs) and titrate to goal rate (Clinimix NO LYTES 5/15- to run over an 18 hr cycle. IVFE 20% to run at 19mL/hr over 18 hr (to provide 99g protein and 1880kcal)), due to line holiday.   Reviewed [facility] note from 07/23/15; pt remains with full thickness wound to midline fistula. Per RN, wound has decreased in size since admission; remains with large eakin pouch connected to suction.  CSW following; plan to return to SNF once medically stable.   Labs reviewed: K: 3.3, CBGS: 89-133.   Diet Order:  Diet NPO time specified Except for: Sips with Meds TPN (CLINIMIX-E) Adult  Skin:  Wound (see comment) (medical abdominal wound with eakin to suction)  Last BM:  07/21/15  Height:   Ht Readings from Last 1 Encounters:  07/15/15 5\' 4"  (1.626 m)    Weight:   Wt Readings from Last 1 Encounters:  07/22/15 227 lb 9.6 oz (103.239 kg)    Ideal Body Weight:  54.5 kg  BMI:   Body mass index is 39.05 kg/(m^2).  Estimated Nutritional Needs:   Kcal:  1700-1900  Protein:  100-110 grams  Fluid:  1.7-1.9 L/day  EDUCATION NEEDS:   No education needs identified at this time  Amarionna Arca A. Jimmye Norman, RD, LDN, CDE Pager: [phone] After hours Pager: [phone]

## 2015-07-23 NOTE — Care Management Important Message (Signed)
Important Message  Patient Details  Name: Marissa [name] MRN: [id number] Date of Birth: 07/06/1941   Medicare Important Message Given:  Yes    Consuelo Thayne P Hendrixx Severin 07/23/2015, 3:36 PM

## 2015-07-23 NOTE — Progress Notes (Addendum)
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  No Known Allergies  Patient Measurements: Height: 5\' 4"  (162.6 cm) Weight: 227 lb 9.6 oz (103.239 kg) IBW/kg (Calculated) : 54.7 Adjusted Body Weight:  Usual Weight:   Vital Signs: Temp: 98.8 F (37.1 C) (11/10 1015) Temp Source: Oral (11/10 1015) BP: 135/66 mmHg (11/10 1015) Pulse Rate: 99 (11/10 1015) Intake/Output from previous day: 11/09 0701 - 11/10 0700 In: 4085.4 [I.V.:2985.4; IV Piggyback:1100] Out: 1150  Intake/Output from this shift:    Labs:  Recent Labs  07/23/15 0615  WBC 7.9  HGB 8.3*  HCT 27.7*  PLT 247     Recent Labs  07/21/15 0610 07/22/15 0352 07/23/15 0615  NA 147* 147* 141  K 3.0* 3.4* 3.3*  CL 94* 99* 97*  CO2 39* 34* 33*  GLUCOSE 162* 134* 127*  BUN 103* 76* 54*  CREATININE 3.23* 3.08* 2.92*  CALCIUM 8.4* 8.5* 8.5*  MG  --   --  1.6*  PHOS  --   --  3.4  PROT  --  6.9 6.0*  ALBUMIN  --  2.6* 2.2*  AST  --  93* 70*  ALT  --  161* 136*  ALKPHOS  --  146* 137*  BILITOT  --  0.9 1.0  BILIDIR  --  0.3  --   IBILI  --  0.6  --    Estimated Creatinine Clearance: 19.8 mL/min (by C-G formula based on Cr of 2.92).    Recent Labs  07/22/15 1639 07/22/15 2143 07/23/15 0748  GLUCAP 89 125* 133*    Medications:  Scheduled:  . heparin  5,000 Units Subcutaneous 3 times per day  . insulin aspart  0-9 Units Subcutaneous 4 times per day  . loperamide  2 mg Oral 4 times per day  . nystatin  5 mL Oral QID  . octreotide  100 mcg Subcutaneous Q12H  . pantoprazole (PROTONIX) IV  40 mg Intravenous Q24H  . sodium chloride  1,000 mL Intravenous Once  . vancomycin  1,250 mg Intravenous Q48H    Insulin Requirements in the past 24 hours:  3 units SSI  Nutritional Goals: per RD note 11/8 1700-1900 kCal, 100-110 grams of protein per day  Current Nutrition:  NPO - TPN held for bacteremia  Per discussions/notes from [facility], was receiving cycled Clinimix 5/15 alternated with E 5/15, 2032ml over 12 hours (2000-0800). Per RN from facility [place], patient was receiving TPN during her entire stay, however patient's admission labs/dehydrated state as well as report from daughter make this questionable.  Assessment: 32 YOF on chronic TPN for a fistula who was admitted from [facility] with lethargy, 2 days of vomiting, increased drainage from fistula and severe dehydration. Her EC output was bilious and Eakin's pouch was removed at facility. CT scan negative for infection however blood cultures from 10/31 were 2/2 CoNS. TPN held for line holiday and PICC was removed. Repeat blood cultures have been clear - and a new PICC line is scheduled to be placed today.   Surgeries/Procedures: 04/28/15: wound exploration, explanation of abdominal mesh Multiple other abdominal surgeries  GI:  Eakin pouch replaced- with suction. Wound care following.. NPO since 11/4 for line holiday with TPN held for PICC line infection. New PICC line to be placed today and TPN resumed. Since off for almost 1 week, will start at a lower rate and titrate to goal - then will plan to cycle as  long as the patient is tolerating. ECF output/24h: 1150 cc. Continues on loperamide, octreotide, PPI IV. Pre-albumin 35.1 (11/7) Endo: A1C this admission is 6.2 (up from 5.7 in July 2016) CBGs/24h: 89-123. On sensitive SSI Lytes: Na 141, K 3.3, Phos 3.4, Mg 1.6, CoCa~9.8.  Will replace Mg today - since CrCl<30 ml/min, will leave KCl replacement up to MD Renal: AoCKD III d/t dehydration - improving. SCr 2.92 << 3.08, CrCl~20 ml/min, BUN down to 54 << 76, UOP not accurately charted. On D5-1/2NS at 125 cc/hr.  Hepatobil: LFTs trending down, 70/136 << 93/161, T bili wnl, Alk phos 137 << 146, TG 84 (11/7) Pulm:  OSA.  RA. CPAP at night Cards: EF 55%; VSS- no meds Neuro: Pt more alert today ID: Vanc for continued treatment of CoNS PICC line infection -  now removed and to be replaced. S/p CTX for UTI, on nystatin for thrush. Will likely need 2 weeks of Vanc from the 1st negative BCx on 11/7. VR 15 mcg/ml on 11/10 down from 32 mcg/ml on 11/8 - calculated ke~0.0158, will plan to restart Vancomycin this morning. May need to recheck a VR prior to redose if renal fxn continues to improve.  Afebrile, WBC wnl Best Practices Hep SQ TPN Access: PICC to be placed 11/10 TPN start date: chronic TPN- was on at home prior to admission in Sept, and continued after discharge. resumed 11/1 at the hospital  Plan: - Restart Clinimix E 5/15 at 40 cc/hr + 20% IVFE at 7 ml/hr - Will add TE and MV to TPN bag - Continue with sensitive SSI as ordered - Will reduce IVF to 85 ml/hr when TPN is started this evening - Restart Vancomycin at 1250 mg IV every 48 hours.  - Will f/u BMET, Mg, Phos in the AM  Alycia Rossetti, PharmD, [place] Pharmacist Pager: [phone] 07/23/2015 11:55 AM

## 2015-07-23 NOTE — Consult Note (Addendum)
WOC wound follow up Current pouch is leaking behind the barrier to lower abd area. Wound type: Full thickness wound to midline abd with fistula, has decreased in size since admission.  Wound bed: 100% red Drainage (amount, consistency, odor) Large amt green drainage in cannister Periwound: Intact skin surrounding wound, no maceration. Dressing procedure/placement/frequency: Applied skin prep to protect skin, then large Eakin pouch with red rubber catheter to low wall suction to control drainage and promote healing. Dressing change no longer painful. [place] team will continue to follow. Supplies at bedside and instructions provided for staff nurses if leakage occurs. Julien Girt MSN, RN, [place], [place], [place]

## 2015-07-23 NOTE — Care Management Note (Signed)
Case Management Note  Patient Details  Name: Marissa [name] MRN: [id number] Date of Birth: August 16, 1941  Subjective/Objective:                    Action/Plan:  UR updated  Expected Discharge Date:                  Expected Discharge Plan:  Skilled Nursing Facility  In-House Referral:  Clinical Social Work  Discharge planning Services     Post Acute Care Choice:    Choice offered to:     DME Arranged:    DME Agency:     HH Arranged:    [place] Agency:     Status of Service:  In process, will continue to follow  Medicare Important Message Given:  Yes-third notification given Date Medicare IM Given:    Medicare IM give by:    Date Additional Medicare IM Given:    Additional Medicare Important Message give by:     If discussed at [place] of Stay Meetings, dates discussed:  07-23-15   Additional Comments:  Marilu Favre, RN 07/23/2015, 2:51 PM

## 2015-07-23 NOTE — Progress Notes (Signed)
[facility]                                                                              Patient Demographics  Marissa [name], is a 74 y.o. female, DOB - [birth year], OZ:[id number]  Admit date - 07/13/2015   Admitting Physician Waldemar Dickens, MD  Outpatient Primary MD for the patient is [name], MD  LOS - 10   Chief Complaint  Patient presents with  . Emesis       Brief HPI    74 y.o. female, with chronic kidney disease, obstructive sleep apnea, diastolic heart failure, type 2 diabetes, multiple abdominal surgeries, and a recent discharge after care of an enterocutaneous fistula, presented to the emergency department from [facility] with lethargy, 2 days of vomiting and severe dehydration. On 04/28/15 the patient underwent abdominal wound exploration of enterocutaneous fistula and abscess by Dr. Georgette Dover. She was admitted to the hospital on 9/22 with increased ostomy output and confusion. She was treated with TPN and Sandostatin. An eakin pouch was placed. Once she stabilized she was discharged on 10/4 to SNF. Marland Kitchen Per the patient's daughter, Dr. Georgette Dover felt the patient could begin to take oral food & drink approximately 2 weeks ago. The patient's TPN was discontinued at some point after that. The patient ate fairly well for 4-5 days but then decreased her oral intake. One week back patient's enterocutaneous fistula began to drain green bile once again. Her stools became the color of green bile. Yesterday prior to admission patient had several episodes of biliary vomiting. In the emergency department the patient's bicarbonate was 12, her creatinine is elevated to 3.44 from 1.75, her BUN is 160, sodium 129. Lactic acid was normal. UA positive for UTI. Patient admitted to hospitalist service and general surgery following.  Assessment & Plan   Acute on chronic kidney injury with metabolic acidosis - prerenal secondary to dehydration. Has underlying chronic  kidney disease stage III.  - Not improved with gentle hydration. Renal ultrasound unremarkable. Appreciate renal consult.  -Given Ringer's lactate followed by D5 with bicarbonate and now getting D5 with half-normal saline as bicarbonate has significantly improved. Renal output and AKI improving - Discontinued indomethacin, colchicine. Avoid all nephrotoxic agents. - not a candidate for HD.   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 surgery following. - NPO, will place PICC line, blood cultures negative for 3 days. Start TNA once PICC line placed - Eakin pounds placed to intermittent wall suction. Wound care following. - Continue Sandostatin.  Coag negative staph bacteremia with sepsis 2/2 on admission - Placed on empiric IV vancomycin. Repeat blood culture from 11/3 still positive for coag-negative bacteremia. PICC line discontinued and repeat blood culture sent.  - Dr Clementeen Graham  discussed with ID, Dr. Linus Salmons. Recommend 2 week course of IV vancomycin. ( stop date 11/14;  - Repeat blood cultures 11/7 negative so far, place PICC line today  Proteus UTI Pansensitive. completed 5 days of empiric Rocephin.  transaminitis  LFTs improving Abdominal ultrasound showed mild associated CBD dilatation, cholecystectomy, normal sonographic appearance of liver, no intrahepatic dilatation.  Acute encephalopathy -  Possible secondary to sepsis and dehydration.  -Appears improved but has flattened affect.  Hypokalemia Replace IV  Hyponatremia improved  Oral thrush Continue nystatin  Chronic diastolic CHF EF of [id number]. Currently hypovolemic and on gentle hydration.  Obstructive sleep apnea Continue bedtime CPAP  Prediabetes monitor with SSI  Severe malnutrition with failure to thrive Restart TNA, after PICC line placed  OSA On CPAP.  Code Status:DO NOT RESUSCITATE  family Communication: Discussed in detail with the patient, all  imaging results, lab results explained to the patient and daughter at the bedside   Disposition Plan: not medically ready  Time Spent in minutes  25 minutes  Procedures  CT abd   Consults Nephrology General surgery  ID  DVT Prophylaxis heparin subcutaneous   Medications  Scheduled Meds: . heparin  5,000 Units Subcutaneous 3 times per day  . insulin aspart  0-9 Units Subcutaneous 4 times per day  . loperamide  2 mg Oral 4 times per day  . magnesium sulfate 1 - 4 g bolus IVPB  1 g Intravenous Once  . nystatin  5 mL Oral QID  . octreotide  100 mcg Subcutaneous Q12H  . pantoprazole (PROTONIX) IV  40 mg Intravenous Q24H  . sodium chloride  1,000 mL Intravenous Once  . vancomycin  1,250 mg Intravenous Q48H   Continuous Infusions: . dextrose 5 % and 0.45% NaCl 125 mL/hr (07/23/15 0553)  . dextrose 5 % and 0.45% NaCl    . Marland KitchenTPN (CLINIMIX-E) Adult     And  . fat emulsion     PRN Meds:.diphenhydrAMINE, Glycerin (Adult), iohexol, morphine injection, nitroGLYCERIN, ondansetron **OR** ondansetron (ZOFRAN) IV, oxyCODONE, sodium chloride   Antibiotics   Anti-infectives    Start     Dose/Rate Route Frequency Ordered Stop   07/23/15 1000  vancomycin (VANCOCIN) 1,250 mg in sodium chloride 0.9 % 250 mL IVPB     1,250 mg 166.7 mL/hr over 90 Minutes Intravenous Every 48 hours 07/23/15 0908     07/16/15 1000  vancomycin (VANCOCIN) 1,500 mg in sodium chloride 0.9 % 500 mL IVPB  Status:  Discontinued     1,500 mg 250 mL/hr over 120 Minutes Intravenous Every 48 hours 07/14/15 0956 07/15/15 1035   07/16/15 1000  vancomycin (VANCOCIN) IVPB 1000 mg/200 mL premix  Status:  Discontinued     1,000 mg 200 mL/hr over 60 Minutes Intravenous Every 48 hours 07/15/15 1035 07/20/15 1242   07/14/15 1500  cefTRIAXone (ROCEPHIN) 1 g in dextrose 5 % 50 mL IVPB  Status:  Discontinued     1 g 100 mL/hr over 30 Minutes Intravenous Every 24 hours 07/13/15 1647 07/17/15 1113   07/14/15 1015  vancomycin  (VANCOCIN) 1,500 mg in sodium chloride 0.9 % 500 mL IVPB     1,500 mg 250 mL/hr over 120 Minutes Intravenous NOW 07/14/15 0943 07/14/15 1210   07/13/15 1400  cefTRIAXone (ROCEPHIN) 1 g in dextrose 5 % 50 mL IVPB     1 g 100 mL/hr over 30 Minutes Intravenous  Once 07/13/15 1359 07/13/15 1526        Subjective:   Marissa [name] was seen and examined today. Much more pleasant and talkative when daughters are around. Denies any specific complaints. No fevers. Patient denies dizziness, chest pain, shortness of breath, new weakness, numbess, tingling. No acute events overnight.  Objective:   Blood pressure 108/45, pulse 103, temperature 98.8 F (37.1 C), temperature source Oral, resp. rate 18, height 5\' 4"  (1.626 m), weight 103.239 kg (227  lb 9.6 oz), SpO2 97 %.  Wt Readings from Last 3 Encounters:  07/22/15 103.239 kg (227 lb 9.6 oz)  06/15/15 102.7 kg (226 lb 6.6 oz)  05/01/15 99.3 kg (218 lb 14.7 oz)     Intake/Output Summary (Last 24 hours) at 07/23/15 1207 Last data filed at 07/23/15 [id number]  Gross per 24 hour  Intake 4085.42 ml  Output   1150 ml  Net 2935.42 ml    Exam  General: Alert and oriented, NAD  HEENT:  PERRLA, EOMI, Anicteric Sclera, mucous membranes moist.   Neck: Supple, no JVD, no masses  CVS: S1 S2 clear, RRR  Respiratory: Clear to auscultation bilaterally, no wheezing, rales or rhonchi  Abdomen: Soft, midline wound, eakin pouch to wall suction   Ext: no cyanosis clubbing or edema  Neuro: no new deficits  Skin: No rashes  Psych: Normal affect and demeanor, alert    Data Review   Micro Results Recent Results (from the past 240 hour(s))  Culture, Urine     Status: None   Collection Time: 07/13/15 12:14 PM  Result Value Ref Range Status   Specimen Description URINE, RANDOM  Final   Special Requests ADDED 2258  Final   Culture >=100,000 COLONIES/mL PROTEUS MIRABILIS  Final   Report Status 07/16/2015 FINAL  Final   Organism ID, Bacteria  PROTEUS MIRABILIS  Final      Susceptibility   Proteus mirabilis - MIC*    AMPICILLIN <=2 SENSITIVE Sensitive     CEFAZOLIN <=4 SENSITIVE Sensitive     CEFTRIAXONE <=1 SENSITIVE Sensitive     CIPROFLOXACIN <=0.25 SENSITIVE Sensitive     GENTAMICIN <=1 SENSITIVE Sensitive     IMIPENEM 4 SENSITIVE Sensitive     NITROFURANTOIN 128 RESISTANT Resistant     TRIMETH/SULFA <=20 SENSITIVE Sensitive     AMPICILLIN/SULBACTAM <=2 SENSITIVE Sensitive     PIP/TAZO <=4 SENSITIVE Sensitive     * >=100,000 COLONIES/mL PROTEUS MIRABILIS  Blood culture (routine x 2)     Status: None   Collection Time: 07/13/15  2:50 PM  Result Value Ref Range Status   Specimen Description BLOOD PICC LINE  Final   Special Requests BOTTLES DRAWN AEROBIC AND ANAEROBIC 5ML  Final   Culture  Setup Time   Final    GRAM POSITIVE COCCI IN CLUSTERS IN BOTH AEROBIC AND ANAEROBIC BOTTLES CRITICAL RESULT CALLED TO, READ BACK BY AND VERIFIED WITH: TO BQIU(RN) BY TCLEVELAND 07/14/2015 AT 5:58 AM    Culture STAPHYLOCOCCUS SPECIES (COAGULASE NEGATIVE)  Final   Report Status 07/16/2015 FINAL  Final   Organism ID, Bacteria STAPHYLOCOCCUS SPECIES (COAGULASE NEGATIVE)  Final      Susceptibility   Staphylococcus species (coagulase negative) - MIC*    CIPROFLOXACIN <=0.5 SENSITIVE Sensitive     ERYTHROMYCIN <=0.25 SENSITIVE Sensitive     GENTAMICIN <=0.5 SENSITIVE Sensitive     OXACILLIN >=4 RESISTANT Resistant     TETRACYCLINE 2 SENSITIVE Sensitive     VANCOMYCIN 2 SENSITIVE Sensitive     TRIMETH/SULFA <=10 SENSITIVE Sensitive     CLINDAMYCIN >=8 RESISTANT Resistant     RIFAMPIN <=0.5 SENSITIVE Sensitive     Inducible Clindamycin NEGATIVE Sensitive     * STAPHYLOCOCCUS SPECIES (COAGULASE NEGATIVE)  Blood culture (routine x 2)     Status: None   Collection Time: 07/13/15  2:55 PM  Result Value Ref Range Status   Specimen Description BLOOD LEFT HAND  Final   Special Requests   Final    BOTTLES DRAWN  AEROBIC AND ANAEROBIC BLUE 5CC,  RED 3CC   Culture  Setup Time   Final    GRAM POSITIVE COCCI IN CLUSTERS AEROBIC BOTTLE ONLY CRITICAL RESULT CALLED TO, READ BACK BY AND VERIFIED WITH: J NARAMDAS,RN AT 1122 07/14/15 BY L BENFIELD    Culture   Final    STAPHYLOCOCCUS SPECIES (COAGULASE NEGATIVE) SUSCEPTIBILITIES PERFORMED ON PREVIOUS CULTURE WITHIN THE LAST 5 DAYS.    Report Status 07/16/2015 FINAL  Final  Culture, blood (routine x 2)     Status: None   Collection Time: 07/16/15  5:10 AM  Result Value Ref Range Status   Specimen Description BLOOD LEFT HAND  Final   Special Requests IN PEDIATRIC BOTTLE 2.5CC  Final   Culture  Setup Time   Final    GRAM POSITIVE COCCI IN CLUSTERS AEROBIC BOTTLE ONLY CRITICAL RESULT CALLED TO, READ BACK BY AND VERIFIED WITH: MIKKI MURPHY @0541  07/17/15 MKELLY    Culture   Final    STAPHYLOCOCCUS SPECIES (COAGULASE NEGATIVE) SUSCEPTIBILITIES PERFORMED ON PREVIOUS CULTURE WITHIN THE LAST 5 DAYS.    Report Status 07/18/2015 FINAL  Final  Culture, blood (routine x 2)     Status: None   Collection Time: 07/16/15  5:23 AM  Result Value Ref Range Status   Specimen Description BLOOD LEFT HAND  Final   Special Requests IN PEDIATRIC BOTTLE 2CC  Final   Culture NO GROWTH 5 DAYS  Final   Report Status 07/21/2015 FINAL  Final  Culture, blood (routine x 2)     Status: None (Preliminary result)   Collection Time: 07/20/15  8:04 PM  Result Value Ref Range Status   Specimen Description BLOOD LEFT HAND  Final   Special Requests IN PEDIATRIC BOTTLE 1CC  Final   Culture NO GROWTH 2 DAYS  Final   Report Status PENDING  Incomplete  Culture, blood (single)     Status: None (Preliminary result)   Collection Time: 07/21/15  6:10 AM  Result Value Ref Range Status   Specimen Description BLOOD LEFT HAND  Final   Special Requests IN PEDIATRIC BOTTLE  1CC  Final   Culture NO GROWTH 1 DAY  Final   Report Status PENDING  Incomplete    Radiology Reports Ct Abdomen Pelvis Wo Contrast  07/13/2015   CLINICAL DATA:  Subsequent encounter for nausea vomiting with low abdominal pain and increased drainage from fistula. EXAM: CT ABDOMEN AND PELVIS WITHOUT CONTRAST TECHNIQUE: Multidetector CT imaging of the abdomen and pelvis was performed following the standard protocol without IV contrast. COMPARISON:  06/04/2015. FINDINGS: Lower chest: Motion artifact with some atelectasis in the dependent bases. Hepatobiliary: No focal abnormality in the liver on this study without intravenous contrast. No evidence of hepatomegaly. Gallbladder is surgically absent. No intrahepatic or extrahepatic biliary dilation. Pancreas: No focal mass lesion. No dilatation of the main duct. No intraparenchymal cyst. No peripancreatic edema. Spleen: No splenomegaly. No focal mass lesion. Adrenals/Urinary Tract: No adrenal nodule or mass. No mass lesion evident in either kidney on this study without intravenous contrast material. No evidence for hydroureteronephrosis. The urinary bladder appears normal for the degree of distention. Stomach/Bowel: Small hiatal hernia noted. Metallic foreign body identified along the posterior wall of the gastric fundus. This is been present on multiple prior studies is well. Duodenum is normally positioned as is the ligament of Treitz. No small bowel wall thickening. No small bowel dilatation. The terminal ileum is normal. The appendix is not visualized, but there is no edema or inflammation in  the region of the cecum. Diverticuli are seen scattered along the entire length of the colon without CT findings of diverticulitis. Small bowel adhesions to the anterior abdominal wall again noted. There is soft tissue attenuation in the midline anterior abdominal wall, at the site of previous surgical wound. This is the area where enterocutaneous fistula was visualized on the previous study. No gas in this region today but no oral contrast was administered to allow assessment of the fistula. Vascular/Lymphatic: There is  abdominal aortic atherosclerosis without aneurysm. There is no gastrohepatic or hepatoduodenal ligament lymphadenopathy. No intraperitoneal or retroperitoneal lymphadenopy. No pelvic sidewall lymphadenopathy. Reproductive: Uterus is surgically absent. There is no adnexal mass. Other: No intraperitoneal free fluid. Musculoskeletal: Bone windows reveal no worrisome lytic or sclerotic osseous lesions. IMPRESSION: Stable exam. No new or acute interval findings. There is no gas in the region of the chronic enterocutaneous fistula no evidence to suggest interval development of an abscess in this region. Fistula not well assessed given the lack of oral contrast on today's study. No evidence for bowel obstruction. No intraperitoneal fluid collection and no intraperitoneal free fluid. Electronically Signed   By: Misty Stanley M.D.   On: 07/13/2015 13:27   Dg Chest 2 View  07/13/2015  CLINICAL DATA:  Emesis.  CHF. EXAM: CHEST  2 VIEW COMPARISON:  06/04/2015 chest radiograph FINDINGS: Right PICC terminates in the lower third of the superior vena cava. Stable cardiomediastinal silhouette with mild cardiomegaly. No pneumothorax. No pleural effusion. Clear lungs, with no focal lung consolidation and no pulmonary edema. IMPRESSION: Stable mild cardiomegaly without pulmonary edema. Lungs appear clear. Electronically Signed   By: Ilona Sorrel M.D.   On: 07/13/2015 12:43   US Abdomen Complete  07/21/2015  CLINICAL DATA:  Elevated liver function studies. EXAM: ULTRASOUND ABDOMEN COMPLETE COMPARISON:  Multiple prior ultrasound and CT examinations. FINDINGS: Gallbladder: Surgically absent Common bile duct: Diameter: 7.1 mm Liver: Normal echogenicity without focal lesion or biliary dilatation. IVC: Normal caliber Pancreas: Poorly visualized but normal on recent CT scans. Spleen: Normal size.  No focal lesions. Right Kidney: Length: 10.4 cm. Mild renal cortical thinning but no mass or hydronephrosis. Left Kidney: Length: 10.3 cm.  Mild renal cortical thinning but no mass or hydronephrosis. Abdominal aorta: Normal caliber Other findings: None. IMPRESSION: Status post cholecystectomy with mild associated common bile duct dilatation. Normal sonographic appearance of the liver. No intrahepatic biliary dilatation. Poor visualization of the pancreas but it was normal on recent CT scans. Electronically Signed   By: Marijo Sanes M.D.   On: 07/21/2015 23:43   US Renal  07/17/2015  CLINICAL DATA:  Renal insufficiency EXAM: RENAL ULTRASOUND COMPARISON:  Renal ultrasound June 08, 2015; CT abdomen and pelvis July 13, 2015 FINDINGS: Right Kidney: Length: 11.1 cm. Echogenicity and renal cortical thickness are within normal limits. No perinephric fluid or hydronephrosis visualized. There is a cyst in the upper pole region measuring 1.5 x 1.5 x 1.5 cm. No sonographically demonstrable calculus or ureterectasis. Left Kidney: Length: 10.8 cm. Echogenicity and renal cortical thickness are within normal limits. No mass, perinephric fluid, or hydronephrosis visualized. No sonographically demonstrable calculus. Bladder: Completely empty and cannot be assessed. IMPRESSION: Cyst arising from upper pole right kidney. Study otherwise unremarkable. Note the urinary bladder is empty and cannot be assessed. Electronically Signed   By: Lowella Grip III M.D.   On: 07/17/2015 11:45   Dg Ugi W/small Bowel  07/20/2015  CLINICAL DATA:  74 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 Prior repair. Question  level of fistula. Subsequent encounter. EXAM: UPPER GI SERIES WITH SMALL BOWEL FOLLOW-THROUGH FLUOROSCOPY TIME:  Radiation Exposure Index (as provided by the fluoroscopic device): 1634.12 micro Gy TECHNIQUE: Water-soluble upper GI series followed by small bowel were obtained after discussion with Dr. Kieth Brightly. COMPARISON:  None. FINDINGS: Draining clips are noted on scout view. Patient was only able to ingest a small amount of Omnipaque 300 at one  time. Total of 150 cc was ingested. The present examination was specifically tailored to evaluate for level of the enterocutaneous fistula. Gastroesophageal reflux noted. Patient was kept in the upright position to avoid aspiration. Scattered duodenal diverticulum. Nonspecific eggshell calcification right upper quadrant. Enterocutaneous fistulas best seen on lateral overhead imaging. This appears to arise from the proximal to mid ileum. Distal aspect of the small bowel not visualized. Patient was tired of the exam at this point. Study terminated. IMPRESSION: Enterocutaneous fistulas best seen on lateral overhead imaging. This appears to arise from the proximal to mid ileum. Exam otherwise limited as noted above. Electronically Signed   By: Genia Del M.D.   On: 07/20/2015 15:46    CBC  Recent Labs Lab 07/19/15 0415 07/20/15 1020 07/23/15 0615  WBC 7.1 7.9 7.9  HGB 10.3* 9.8* 8.3*  HCT 30.6* 28.6* 27.7*  PLT 251 242 247  MCV 71.8* 70.6* 80.5  MCH 24.2* 24.2* 24.1*  MCHC 33.7 34.3 30.0  RDW 15.3 14.6 14.3  LYMPHSABS  --  1.0  --   MONOABS  --  0.9  --   EOSABS  --  0.5  --   BASOSABS  --  0.1  --     Chemistries   Recent Labs Lab 07/17/15 0424  07/19/15 0415 07/20/15 1020 07/21/15 0610 07/22/15 0352 07/23/15 0615  NA 127*  < > 138 145 147* 147* 141  K 4.2  < > 3.4* 2.9* 3.0* 3.4* 3.3*  CL 106  < > 104 98* 94* 99* 97*  CO2 9*  < > 18* 33* 39* 34* 33*  GLUCOSE 150*  < > 135* 164* 162* 134* 127*  BUN 156*  < > 148* 138* 103* 76* 54*  CREATININE 3.32*  < > 3.38* 3.70* 3.23* 3.08* 2.92*  CALCIUM 9.4  < > 9.6 9.3 8.4* 8.5* 8.5*  MG 1.9  --   --  2.1  --   --  1.6*  AST  --   --   --  99*  --  93* 70*  ALT  --   --   --  135*  --  161* 136*  ALKPHOS  --   --   --  174*  --  146* 137*  BILITOT  --   --   --  0.9  --  0.9 1.0  < > = values in this interval not  displayed. ------------------------------------------------------------------------------------------------------------------ estimated creatinine clearance is 19.8 mL/min (by C-G formula based on Cr of 2.92). ------------------------------------------------------------------------------------------------------------------ No results for input(s): HGBA1C in the last 72 hours. ------------------------------------------------------------------------------------------------------------------ No results for input(s): CHOL, HDL, LDLCALC, TRIG, CHOLHDL, LDLDIRECT in the last 72 hours. ------------------------------------------------------------------------------------------------------------------ No results for input(s): TSH, T4TOTAL, T3FREE, THYROIDAB in the last 72 hours.  Invalid input(s): FREET3 ------------------------------------------------------------------------------------------------------------------ No results for input(s): VITAMINB12, FOLATE, FERRITIN, TIBC, IRON, RETICCTPCT in the last 72 hours.  Coagulation profile No results for input(s): INR, PROTIME in the last 168 hours.  No results for input(s): DDIMER in the last 72 hours.  Cardiac Enzymes No results for input(s): CKMB, TROPONINI, MYOGLOBIN in the last 168  hours.  Invalid input(s): CK ------------------------------------------------------------------------------------------------------------------ Invalid input(s): [place]  07/21/15 2004 07/22/15 0742 07/22/15 1223 07/22/15 1639 07/22/15 2143 07/23/15 0748  GLUCAP 123* 134* 115* 89 125* 133*     RAI,RIPUDEEP M.D. [facility] 07/23/2015, 12:07 PM  Pager: [phone] Between 7am to 7pm - call Pager - [phone]  After 7pm go to [email or web address] - password [email or web address]  Call night coverage person covering after 7pm

## 2015-07-23 NOTE — Progress Notes (Signed)
Patient ID: Marissa [name], female   DOB: October 14, 1940, 73 y.o.   MRN: [id number]     [facility]      [address] [place], [place] [address]    Phone: [phone] FAX: [phone]     Subjective: 1120ml from ECF.  More conversant today.  Complains of leg pain. sCr down to 2.92.   Objective:  Vital signs:  Filed Vitals:   07/22/15 0527 07/22/15 1410 07/22/15 2146 07/23/15 0655  BP: 114/78 112/62 114/64 113/43  Pulse: 99 89 101 93  Temp: 97.9 F (36.6 C) 97.6 F (36.4 C) 97.9 F (36.6 C) 98.4 F (36.9 C)  TempSrc: Oral Oral Oral Oral  Resp: $Remo'18 18 18 18  'RVrIw$ Height:      Weight: 103.239 kg (227 lb 9.6 oz)     SpO2: 99% 99% 100% 99%    Last BM Date: 07/18/15  Intake/Output   Yesterday:  11/09 0701 - 11/10 0700 In: 4085.4 [I.V.:2985.4; IV Piggyback:1100] Out: 1150  This shift:    I/O last 3 completed shifts: In: 6316.7 [I.V.:5216.7; IV Piggyback:1100] Out: 1750 [Other:1750]    Physical Exam: General: Pt awake/alert/oriented x4 in no acute distress  Abdomen: Soft. Nondistended. Non tender. Midline wound/ECF with bilious output.    Problem List:   Principal Problem:   Acute on chronic renal failure (HCC) Active Problems:   OSA (obstructive sleep apnea)   Essential hypertension   Chronic kidney disease, stage 3   Chronic diastolic CHF (congestive heart failure) (HCC)   Enterocutaneous fistula   Type 2 diabetes mellitus without complication (HCC)   Hyponatremia   Dehydration   Metabolic acidosis   Urinary tract infection   Oral thrush    Results:   Labs: Results for orders placed or performed during the hospital encounter of 07/13/15 (from the past 48 hour(s))  Glucose, capillary     Status: None   Collection Time: 07/21/15 12:42 PM  Result Value Ref Range   Glucose-Capillary 88 65 - 99 mg/dL  Glucose, capillary     Status: Abnormal   Collection Time: 07/21/15  4:51 PM  Result Value Ref Range   Glucose-Capillary 129 (H) 65 - 99 mg/dL   Comment 1 Notify RN   Hepatitis panel, acute     Status: None   Collection Time: 07/21/15  7:11 PM  Result Value Ref Range   Hepatitis B Surface Ag Negative Negative   HCV Ab 0.1 0.0 - 0.9 s/co ratio    Comment: (NOTE)                                  Negative:     < 0.8                             Indeterminate: 0.8 - 0.9                                  Positive:     > 0.9 The CDC recommends that a positive HCV antibody result be followed up with a HCV Nucleic Acid Amplification test (654650). Performed At: [facility] [place], [place] [id number] Lindon Romp MD XN:[id number]    Hep A IgM Negative Negative   Hep B C IgM Negative Negative  Glucose, capillary  Status: Abnormal   Collection Time: 07/21/15  8:04 PM  Result Value Ref Range   Glucose-Capillary 123 (H) 65 - 99 mg/dL  Basic metabolic panel     Status: Abnormal   Collection Time: 07/22/15  3:52 AM  Result Value Ref Range   Sodium 147 (H) 135 - 145 mmol/L   Potassium 3.4 (L) 3.5 - 5.1 mmol/L   Chloride 99 (L) 101 - 111 mmol/L   CO2 34 (H) 22 - 32 mmol/L   Glucose, Bld 134 (H) 65 - 99 mg/dL   BUN 76 (H) 6 - 20 mg/dL   Creatinine, Ser 3.08 (H) 0.44 - 1.00 mg/dL   Calcium 8.5 (L) 8.9 - 10.3 mg/dL   GFR calc non Af Amer 14 (L) >60 mL/min   GFR calc Af Amer 16 (L) >60 mL/min    Comment: (NOTE) The eGFR has been calculated using the CKD EPI equation. This calculation has not been validated in all clinical situations. eGFR's persistently <60 mL/min signify possible Chronic Kidney Disease.    Anion gap 14 5 - 15  Hepatic function panel     Status: Abnormal   Collection Time: 07/22/15  3:52 AM  Result Value Ref Range   Total Protein 6.9 6.5 - 8.1 g/dL   Albumin 2.6 (L) 3.5 - 5.0 g/dL   AST 93 (H) 15 - 41 U/L   ALT 161 (H) 14 - 54 U/L   Alkaline Phosphatase 146 (H) 38 - 126 U/L   Total Bilirubin 0.9 0.3 - 1.2 mg/dL   Bilirubin, Direct 0.3 0.1  - 0.5 mg/dL   Indirect Bilirubin 0.6 0.3 - 0.9 mg/dL  Glucose, capillary     Status: Abnormal   Collection Time: 07/22/15  7:42 AM  Result Value Ref Range   Glucose-Capillary 134 (H) 65 - 99 mg/dL  Glucose, capillary     Status: Abnormal   Collection Time: 07/22/15 12:23 PM  Result Value Ref Range   Glucose-Capillary 115 (H) 65 - 99 mg/dL  Glucose, capillary     Status: None   Collection Time: 07/22/15  4:39 PM  Result Value Ref Range   Glucose-Capillary 89 65 - 99 mg/dL  Glucose, capillary     Status: Abnormal   Collection Time: 07/22/15  9:43 PM  Result Value Ref Range   Glucose-Capillary 125 (H) 65 - 99 mg/dL   Comment 1 Notify RN   Comprehensive metabolic panel     Status: Abnormal   Collection Time: 07/23/15  6:15 AM  Result Value Ref Range   Sodium 141 135 - 145 mmol/L   Potassium 3.3 (L) 3.5 - 5.1 mmol/L   Chloride 97 (L) 101 - 111 mmol/L   CO2 33 (H) 22 - 32 mmol/L   Glucose, Bld 127 (H) 65 - 99 mg/dL   BUN 54 (H) 6 - 20 mg/dL   Creatinine, Ser 2.92 (H) 0.44 - 1.00 mg/dL   Calcium 8.5 (L) 8.9 - 10.3 mg/dL   Total Protein 6.0 (L) 6.5 - 8.1 g/dL   Albumin 2.2 (L) 3.5 - 5.0 g/dL   AST 70 (H) 15 - 41 U/L   ALT 136 (H) 14 - 54 U/L   Alkaline Phosphatase 137 (H) 38 - 126 U/L   Total Bilirubin 1.0 0.3 - 1.2 mg/dL   GFR calc non Af Amer 15 (L) >60 mL/min   GFR calc Af Amer 17 (L) >60 mL/min    Comment: (NOTE) The eGFR has been calculated using the CKD EPI equation. This  calculation has not been validated in all clinical situations. eGFR's persistently <60 mL/min signify possible Chronic Kidney Disease.    Anion gap 11 5 - 15  Magnesium     Status: Abnormal   Collection Time: 07/23/15  6:15 AM  Result Value Ref Range   Magnesium 1.6 (L) 1.7 - 2.4 mg/dL  Phosphorus     Status: None   Collection Time: 07/23/15  6:15 AM  Result Value Ref Range   Phosphorus 3.4 2.5 - 4.6 mg/dL  Vancomycin, random     Status: None   Collection Time: 07/23/15  6:15 AM  Result Value Ref  Range   Vancomycin Rm 15 ug/mL    Comment:        Random Vancomycin therapeutic range is dependent on dosage and time of specimen collection. A peak range is 20.0-40.0 ug/mL A trough range is 5.0-15.0 ug/mL          CBC     Status: Abnormal   Collection Time: 07/23/15  6:15 AM  Result Value Ref Range   WBC 7.9 4.0 - 10.5 K/uL   RBC 3.44 (L) 3.87 - 5.11 MIL/uL   Hemoglobin 8.3 (L) 12.0 - 15.0 g/dL   HCT 27.7 (L) 36.0 - 46.0 %   MCV 80.5 78.0 - 100.0 fL   MCH 24.1 (L) 26.0 - 34.0 pg   MCHC 30.0 30.0 - 36.0 g/dL   RDW 14.3 11.5 - 15.5 %   Platelets 247 150 - 400 K/uL  Glucose, capillary     Status: Abnormal   Collection Time: 07/23/15  7:48 AM  Result Value Ref Range   Glucose-Capillary 133 (H) 65 - 99 mg/dL    Imaging / Studies: US Abdomen Complete  07/21/2015  CLINICAL DATA:  Elevated liver function studies. EXAM: ULTRASOUND ABDOMEN COMPLETE COMPARISON:  Multiple prior ultrasound and CT examinations. FINDINGS: Gallbladder: Surgically absent Common bile duct: Diameter: 7.1 mm Liver: Normal echogenicity without focal lesion or biliary dilatation. IVC: Normal caliber Pancreas: Poorly visualized but normal on recent CT scans. Spleen: Normal size.  No focal lesions. Right Kidney: Length: 10.4 cm. Mild renal cortical thinning but no mass or hydronephrosis. Left Kidney: Length: 10.3 cm. Mild renal cortical thinning but no mass or hydronephrosis. Abdominal aorta: Normal caliber Other findings: None. IMPRESSION: Status post cholecystectomy with mild associated common bile duct dilatation. Normal sonographic appearance of the liver. No intrahepatic biliary dilatation. Poor visualization of the pancreas but it was normal on recent CT scans. Electronically Signed   By: Marijo Sanes M.D.   On: 07/21/2015 23:43    Medications / Allergies:  Scheduled Meds: . heparin  5,000 Units Subcutaneous 3 times per day  . insulin aspart  0-9 Units Subcutaneous 4 times per day  . loperamide  2 mg Oral 4  times per day  . nystatin  5 mL Oral QID  . octreotide  100 mcg Subcutaneous Q12H  . pantoprazole (PROTONIX) IV  40 mg Intravenous Q24H   Continuous Infusions: . dextrose 5 % and 0.45% NaCl 125 mL/hr (07/23/15 0553)   PRN Meds:.diphenhydrAMINE, Glycerin (Adult), iohexol, morphine injection, nitroGLYCERIN, ondansetron **OR** ondansetron (ZOFRAN) IV, oxyCODONE, sodium chloride  Antibiotics: Anti-infectives    Start     Dose/Rate Route Frequency Ordered Stop   07/16/15 1000  vancomycin (VANCOCIN) 1,500 mg in sodium chloride 0.9 % 500 mL IVPB  Status:  Discontinued     1,500 mg 250 mL/hr over 120 Minutes Intravenous Every 48 hours 07/14/15 0956 07/15/15 1035   07/16/15  1000  vancomycin (VANCOCIN) IVPB 1000 mg/200 mL premix  Status:  Discontinued     1,000 mg 200 mL/hr over 60 Minutes Intravenous Every 48 hours 07/15/15 1035 07/20/15 1242   07/14/15 1500  cefTRIAXone (ROCEPHIN) 1 g in dextrose 5 % 50 mL IVPB  Status:  Discontinued     1 g 100 mL/hr over 30 Minutes Intravenous Every 24 hours 07/13/15 1647 07/17/15 1113   07/14/15 1015  vancomycin (VANCOCIN) 1,500 mg in sodium chloride 0.9 % 500 mL IVPB     1,500 mg 250 mL/hr over 120 Minutes Intravenous NOW 07/14/15 0943 07/14/15 1210   07/13/15 1400  cefTRIAXone (ROCEPHIN) 1 g in dextrose 5 % 50 mL IVPB     1 g 100 mL/hr over 30 Minutes Intravenous  Once 07/13/15 1359 07/13/15 1526       Assessment/Plan ECF-eakin's pouch, appreciate WOC management. -accurate I&Os -sandostatin/loperamide to slow down transit -cont strict NPO as output is still relatively high. 1600-->1100.  Give IVF bolus -SBFT did show EC fistula to be fairly distal in the proximal to mid ileum -i had a long discussion with the patient and the daughter.  They do not wish to continue with current plan as she keep bouncing back into the hospital.  They either want to proceed with surgery if it is an option at this point or transition off TPN and onto comfort care.   We will further discuss options with Dr. Georgette Dover.  ID-BCs with GPC. repeat Cx 12/7 are negative to date. Plan for PICC line placement today.   FEN-NPO.  PCM- prealbumin 35.1(11/7).  Resume TPN Acute on chronic renal failure- per medicine and renal, Cr 3.08 yesterday VTE prophylaxis-SCD/heparin   Erby Pian, [facility] Pager [phone](7A-4:30P) For consults and floor pages call [phone](7A-4:30P)  07/23/2015 8:49 AM

## 2015-07-23 NOTE — Progress Notes (Signed)
Peripherally Inserted Central Catheter/Midline Placement  The IV Nurse has discussed with the patient and/or persons authorized to consent for the patient, the purpose of this procedure and the potential benefits and risks involved with this procedure.  The benefits include less needle sticks, lab draws from the catheter and patient may be discharged home with the catheter.  Risks include, but not limited to, infection, bleeding, blood clot (thrombus formation), and puncture of an artery; nerve damage and irregular heat beat.  Alternatives to this procedure were also discussed.  PICC/Midline Placement Documentation  PICC / Midline Double Lumen 0000000 PICC Right Basilic 47 cm 6 cm (Active)  Indication for Insertion or Continuance of Line Administration of hyperosmolar/irritating solutions (i.e. TPN, Vancomycin, etc.) 07/23/2015  3:00 PM  Exposed Catheter (cm) 6 cm 07/23/2015  3:00 PM  Dressing Change Due 07/30/15 07/23/2015  3:00 PM       Jule Economy Horton 07/23/2015, 3:12 PM

## 2015-07-23 NOTE — Progress Notes (Signed)
Spoke with Dr. Moshe Cipro regarding the PICC order.  States OK to place with CRCL below 30.

## 2015-07-23 NOTE — Progress Notes (Signed)
Physical Therapy Treatment Patient Details Name: Marissa [name] MRN: [id number] DOB: [birth year] Today's Date: 07/23/2015    History of Present Illness 74 y.o. female presenting with emesis. Pt was admitted on 9/22 with AMS, abdominal wounds and pain, dehydration and weakness. PMH: Chronic kidney disease, obstructive sleep apnea, diastolic heart failure, DMII, HTN and Hidradenitis suppurativa.    PT Comments    Pt stated she was in no pain upon entering room, however, she seemed to grimace and said "foot" whenever SPTA touched left foot. SPTA took sock off and did not see any pressure sores, nurse did inform SPTA/PT that pt daughter mentioned she has gout. Pt able to sit EOB for ~10 minutes requiring no support while doing BLE exercises, pt did lean to the left twice and needed min assistance to sit back up straight. Pt was able to scoot to have feet touching the ground requiring only min A.   Follow Up Recommendations  SNF     Equipment Recommendations  None recommended by PT       Precautions / Restrictions Precautions Precautions: Fall Precaution Comments: wound pouch in place mid-stomach region Restrictions Weight Bearing Restrictions: No    Mobility  Bed Mobility Overal bed mobility: Needs Assistance Bed Mobility: Rolling;Sidelying to Sit;Sit to Sidelying Rolling: Mod assist Sidelying to sit: Mod assist   Sit to supine: Mod assist   General bed mobility comments: Pt requiring mod a with bed mobility with verbal and tactile cuing of hand placement and sequencing  Transfers Overall transfer level: Needs assistance Equipment used: Rolling walker (2 wheeled) Transfers: Sit to/from Stand Sit to Stand: Mod assist;+2 physical assistance         General transfer comment: 2 attempted trials to stand from bed, pt able to stand half way but sat back down quickly. On third attempt, rolling walker was given to patient and patient was able to stand ~1 minute requiring mod  assistance +2 to stand, mod assist once standing.                                     Balance Overall balance assessment: Needs assistance Sitting-balance support: No upper extremity supported;Feet supported Sitting balance-Leahy Scale: Good Sitting balance - Comments: Pt was able to have hands in her lap while doing BLE exercises.     Standing balance-Leahy Scale: Poor Standing balance comment: pt able to stand while holding on to the walker and +2 assistance for safety                    Cognition Arousal/Alertness: Awake/alert Behavior During Therapy: Anxious;Flat affect Overall Cognitive Status: No family/caregiver present to determine baseline cognitive functioning                      Exercises General Exercises - Lower Extremity Ankle Circles/Pumps: AROM;AAROM;Right;20 reps;Seated (pt unable to do ankle pumps on left foot due to pain) Long Arc Quad: AROM;AAROM;Both;10 reps;Seated Hip Flexion/Marching: AROM;AAROM;Both;10 reps;Seated        Pertinent Vitals/Pain Pain Assessment: No/denies pain Pain Score: 0-No pain           PT Goals (current goals can now be found in the care plan section) Progress towards PT goals: Progressing toward goals    Frequency  Min 2X/week    PT Plan Current plan remains appropriate       End of Session Equipment Utilized During Treatment: Other (  comment) (RW) Activity Tolerance: Patient limited by fatigue;Patient limited by pain Patient left: in bed;with call bell/phone within reach     Time: 1152-1228 PT Time Calculation (min) (ACUTE ONLY): 36 min  Charges:    [name], [place] OFFICE 07/23/2015, 1:05 PM

## 2015-07-24 LAB — BASIC METABOLIC PANEL
ANION GAP: 6 (ref 5–15)
BUN: 42 mg/dL — AB (ref 6–20)
CHLORIDE: 103 mmol/L (ref 101–111)
CO2: 33 mmol/L — AB (ref 22–32)
Calcium: 8.5 mg/dL — ABNORMAL LOW (ref 8.9–10.3)
Creatinine, Ser: 2.59 mg/dL — ABNORMAL HIGH (ref 0.44–1.00)
GFR calc Af Amer: 20 mL/min — ABNORMAL LOW (ref >60)
GFR, EST NON AFRICAN AMERICAN: 17 mL/min — AB (ref >60)
GLUCOSE: 144 mg/dL — AB (ref 65–99)
POTASSIUM: 3.3 mmol/L — AB (ref 3.5–5.1)
Sodium: 142 mmol/L (ref 135–145)

## 2015-07-24 LAB — GLUCOSE, CAPILLARY
GLUCOSE-CAPILLARY: 146 mg/dL — AB (ref 65–99)
GLUCOSE-CAPILLARY: 153 mg/dL — AB (ref 65–99)
Glucose-Capillary: 120 mg/dL — ABNORMAL HIGH (ref 65–99)
Glucose-Capillary: 164 mg/dL — ABNORMAL HIGH (ref 65–99)

## 2015-07-24 LAB — PHOSPHORUS: Phosphorus: 3.8 mg/dL (ref 2.5–4.6)

## 2015-07-24 LAB — MAGNESIUM: Magnesium: 1.7 mg/dL (ref 1.7–2.4)

## 2015-07-24 MED ORDER — MAGNESIUM SULFATE IN D5W 10-5 MG/ML-% IV SOLN
1.0000 g | Freq: Once | INTRAVENOUS | Status: AC
  Administered 2015-07-24: 1 g via INTRAVENOUS
  Filled 2015-07-24: qty 100

## 2015-07-24 MED ORDER — TRACE MINERALS CR-CU-MN-SE-ZN 10-1000-500-60 MCG/ML IV SOLN
INTRAVENOUS | Status: AC
  Administered 2015-07-24: 18:00:00 via INTRAVENOUS
  Filled 2015-07-24: qty 1992

## 2015-07-24 MED ORDER — POTASSIUM CHLORIDE CRYS ER 20 MEQ PO TBCR: 40.0000 meq | EXTENDED_RELEASE_TABLET | Freq: Once | ORAL | Status: DC

## 2015-07-24 MED ORDER — HYDROXYZINE HCL 10 MG PO TABS
5.0000 mg | ORAL_TABLET | Freq: Three times a day (TID) | ORAL | Status: DC | PRN
  Administered 2015-07-24 – 2015-07-31 (×2): 5 mg via ORAL
  Filled 2015-07-24 (×5): qty 1

## 2015-07-24 MED ORDER — POTASSIUM CHLORIDE 10 MEQ/100ML IV SOLN
10.0000 meq | INTRAVENOUS | Status: AC
  Administered 2015-07-24 (×2): 10 meq via INTRAVENOUS
  Filled 2015-07-24 (×2): qty 100

## 2015-07-24 MED ORDER — FAT EMULSION 20 % IV EMUL
240.0000 mL | INTRAVENOUS | Status: AC
  Administered 2015-07-24: 240 mL via INTRAVENOUS
  Filled 2015-07-24: qty 250

## 2015-07-24 MED ORDER — DEXTROSE-NACL 5-0.45 % IV SOLN
INTRAVENOUS | Status: DC
  Administered 2015-07-24 – 2015-07-27 (×6): via INTRAVENOUS

## 2015-07-24 MED ORDER — SODIUM CHLORIDE 0.9 % IV BOLUS (SEPSIS)
500.0000 mL | Freq: Once | INTRAVENOUS | Status: AC
  Administered 2015-07-24: 500 mL via INTRAVENOUS

## 2015-07-24 MED ORDER — DIPHENHYDRAMINE-ZINC ACETATE 2-0.1 % EX CREA
TOPICAL_CREAM | Freq: Two times a day (BID) | CUTANEOUS | Status: DC | PRN
  Administered 2015-07-24: 19:00:00 via TOPICAL
  Filled 2015-07-24: qty 28

## 2015-07-24 NOTE — Consult Note (Signed)
WOC follow-up: Current middle abd Eakin pouch in place with good seal,  pouch to abd fistula was changed yesterday. Decreased amt green drainage in the cannister with red rubber tube in the bag to cont low wall suction. Supplies at the bedside and instructions have been provided for staff if leakage occurs. Julien Girt MSN, RN, [place], [place], [place]

## 2015-07-24 NOTE — Progress Notes (Signed)
[facility]                                                                              Patient Demographics  Marissa [name], is a 74 y.o. female, DOB - [birth year], [id number]  Admit date - 07/13/2015   Admitting Physician Waldemar Dickens, MD  Outpatient Primary MD for the patient is [name], MD  LOS - 11   Chief Complaint  Patient presents with  . Emesis       Brief HPI    74 y.o. female, with chronic kidney disease, obstructive sleep apnea, diastolic heart failure, type 2 diabetes, multiple abdominal surgeries, and a recent discharge after care of an enterocutaneous fistula, presented to the emergency department from [facility] with lethargy, 2 days of vomiting and severe dehydration. On 04/28/15 the patient underwent abdominal wound exploration of enterocutaneous fistula and abscess by Dr. Georgette Dover. She was admitted to the hospital on 9/22 with increased ostomy output and confusion. She was treated with TPN and Sandostatin. An eakin pouch was placed. Once she stabilized she was discharged on 10/4 to SNF. Marland Kitchen Per the patient's daughter, Dr. Georgette Dover felt the patient could begin to take oral food & drink approximately 2 weeks ago. The patient's TPN was discontinued at some point after that. The patient ate fairly well for 4-5 days but then decreased her oral intake. One week back patient's enterocutaneous fistula began to drain green bile once again. Her stools became the color of green bile. Yesterday prior to admission patient had several episodes of biliary vomiting. In the emergency department the patient's bicarbonate was 12, her creatinine is elevated to 3.44 from 1.75, her BUN is 160, sodium 129. Lactic acid was normal. UA positive for UTI. Patient admitted to hospitalist service and general surgery following.  Assessment & Plan   Acute on chronic kidney injury with metabolic acidosis- improving, prerenal secondary to dehydration. Has underlying  chronic kidney disease stage III.  - Given Ringer's lactate followed by D5 with bicarbonate and now getting D5 with half-normal saline as bicarbonate has significantly improved. Renal output and AKI improving - Discontinued indomethacin, colchicine. Avoid all nephrotoxic agents. - not a candidate for HD.   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 surgery following. - NPO, PICC line placed, on TPN, blood cultures from 11/7 and 11/18 remains negative - Eakin pounds placed to intermittent wall suction. Wound care following. - Continue Sandostatin. - Gen. surgery considering surgical options, possibly next week  Coag negative staph bacteremia with sepsis 2/2 on admission - Placed on empiric IV vancomycin. Repeat blood culture from 11/3 still positive for coag-negative bacteremia. PICC line discontinued and repeat blood culture were sent.  - Dr Clementeen Graham  discussed with ID, Dr. Linus Salmons. Recommend 2 week course of IV vancomycin. ( stop date 11/20 from the clear blood cultures) - Repeat blood cultures 11/7 negative so far, PICC line placed on 11/10  Proteus UTI Pansensitive. completed 5 days of empiric Rocephin.  transaminitis  LFTs improving Abdominal ultrasound showed mild associated CBD dilatation, cholecystectomy, normal sonographic appearance of liver, no intrahepatic dilatation.  Acute encephalopathy -  Possible secondary to sepsis and dehydration. Appears improved.  Hypokalemia Replace IV  Hyponatremia improving  Oral thrush Continue nystatin  Chronic diastolic CHF EF of [id number]. Currently hypovolemic and on gentle hydration.  Obstructive sleep apnea Continue bedtime CPAP  Prediabetes monitor with SSI  Severe malnutrition with failure to thrive TPN restarted  OSA On CPAP.  Code Status:DO NOT RESUSCITATE  family Communication: Discussed in detail with the patient, all imaging results, lab results explained to the  patient    Disposition Plan: not medically ready  Time Spent in minutes  25 minutes  Procedures  CT abd   Consults Nephrology General surgery  ID  DVT Prophylaxis heparin subcutaneous   Medications  Scheduled Meds: . heparin  5,000 Units Subcutaneous 3 times per day  . insulin aspart  0-9 Units Subcutaneous 4 times per day  . loperamide  2 mg Oral 4 times per day  . magnesium sulfate 1 - 4 g bolus IVPB  1 g Intravenous Once  . nystatin  5 mL Oral QID  . octreotide  100 mcg Subcutaneous Q12H  . pantoprazole (PROTONIX) IV  40 mg Intravenous Q24H  . potassium chloride  10 mEq Intravenous Q1 Hr x 2  . sodium chloride  500 mL Intravenous Once  . vancomycin  1,250 mg Intravenous Q48H   Continuous Infusions: . dextrose 5 % and 0.45% NaCl 85 mL/hr at 07/24/15 0354  . dextrose 5 % and 0.45% NaCl    . Marland KitchenTPN (CLINIMIX-E) Adult 40 mL/hr at 07/23/15 1829   And  . fat emulsion 168 mL (07/23/15 1829)  . Marland KitchenTPN (CLINIMIX-E) Adult     And  . fat emulsion     PRN Meds:.diphenhydrAMINE, Glycerin (Adult), iohexol, morphine injection, nitroGLYCERIN, ondansetron **OR** ondansetron (ZOFRAN) IV, oxyCODONE, sodium chloride, sodium chloride   Antibiotics   Anti-infectives    Start     Dose/Rate Route Frequency Ordered Stop   07/23/15 1000  vancomycin (VANCOCIN) 1,250 mg in sodium chloride 0.9 % 250 mL IVPB     1,250 mg 166.7 mL/hr over 90 Minutes Intravenous Every 48 hours 07/23/15 0908     07/16/15 1000  vancomycin (VANCOCIN) 1,500 mg in sodium chloride 0.9 % 500 mL IVPB  Status:  Discontinued     1,500 mg 250 mL/hr over 120 Minutes Intravenous Every 48 hours 07/14/15 0956 07/15/15 1035   07/16/15 1000  vancomycin (VANCOCIN) IVPB 1000 mg/200 mL premix  Status:  Discontinued     1,000 mg 200 mL/hr over 60 Minutes Intravenous Every 48 hours 07/15/15 1035 07/20/15 1242   07/14/15 1500  cefTRIAXone (ROCEPHIN) 1 g in dextrose 5 % 50 mL IVPB  Status:  Discontinued     1 g 100 mL/hr over 30  Minutes Intravenous Every 24 hours 07/13/15 1647 07/17/15 1113   07/14/15 1015  vancomycin (VANCOCIN) 1,500 mg in sodium chloride 0.9 % 500 mL IVPB     1,500 mg 250 mL/hr over 120 Minutes Intravenous NOW 07/14/15 0943 07/14/15 1210   07/13/15 1400  cefTRIAXone (ROCEPHIN) 1 g in dextrose 5 % 50 mL IVPB     1 g 100 mL/hr over 30 Minutes Intravenous  Once 07/13/15 1359 07/13/15 1526        Subjective:   Andrina Breckner was seen and examined today. Complaining of itching on the back otherwise no acute issues.  No fevers. Patient denies dizziness, chest pain, shortness of breath, new weakness, numbess, tingling. No acute events overnight.  Objective:   Blood pressure 120/88, pulse  99, temperature 98.8 F (37.1 C), temperature source Oral, resp. rate 18, height 5\' 4"  (1.626 m), weight 103.239 kg (227 lb 9.6 oz), SpO2 99 %.  Wt Readings from Last 3 Encounters:  07/22/15 103.239 kg (227 lb 9.6 oz)  06/15/15 102.7 kg (226 lb 6.6 oz)  05/01/15 99.3 kg (218 lb 14.7 oz)     Intake/Output Summary (Last 24 hours) at 07/24/15 1105 Last data filed at 07/24/15 [id number]  Gross per 24 hour  Intake 1476.29 ml  Output   1000 ml  Net 476.29 ml    Exam  General: Alert and oriented, NAD  HEENT:  PERRLA, EOMI, Anicteric Sclera  Neck: Supple, no JVD, no masses  CVS: S1 S2 clear, RRR  Respiratory: CTAB  Abdomen: Soft, midline wound, eakin pouch to wall suction   Ext: no cyanosis clubbing or edema  Neuro: no new deficits  Skin: No rashes  Psych: Normal affect and demeanor, alert    Data Review   Micro Results Recent Results (from the past 240 hour(s))  Culture, blood (routine x 2)     Status: None   Collection Time: 07/16/15  5:10 AM  Result Value Ref Range Status   Specimen Description BLOOD LEFT HAND  Final   Special Requests IN PEDIATRIC BOTTLE 2.5CC  Final   Culture  Setup Time   Final    GRAM POSITIVE COCCI IN CLUSTERS AEROBIC BOTTLE ONLY CRITICAL RESULT CALLED TO, READ  BACK BY AND VERIFIED WITH: MIKKI MURPHY @0541  07/17/15 MKELLY    Culture   Final    STAPHYLOCOCCUS SPECIES (COAGULASE NEGATIVE) SUSCEPTIBILITIES PERFORMED ON PREVIOUS CULTURE WITHIN THE LAST 5 DAYS.    Report Status 07/18/2015 FINAL  Final  Culture, blood (routine x 2)     Status: None   Collection Time: 07/16/15  5:23 AM  Result Value Ref Range Status   Specimen Description BLOOD LEFT HAND  Final   Special Requests IN PEDIATRIC BOTTLE 2CC  Final   Culture NO GROWTH 5 DAYS  Final   Report Status 07/21/2015 FINAL  Final  Culture, blood (routine x 2)     Status: None (Preliminary result)   Collection Time: 07/20/15  8:04 PM  Result Value Ref Range Status   Specimen Description BLOOD LEFT HAND  Final   Special Requests IN PEDIATRIC BOTTLE 1CC  Final   Culture NO GROWTH 3 DAYS  Final   Report Status PENDING  Incomplete  Culture, blood (single)     Status: None (Preliminary result)   Collection Time: 07/21/15  6:10 AM  Result Value Ref Range Status   Specimen Description BLOOD LEFT HAND  Final   Special Requests IN PEDIATRIC BOTTLE  1CC  Final   Culture NO GROWTH 2 DAYS  Final   Report Status PENDING  Incomplete    Radiology Reports Ct Abdomen Pelvis Wo Contrast  07/13/2015  CLINICAL DATA:  Subsequent encounter for nausea vomiting with low abdominal pain and increased drainage from fistula. EXAM: CT ABDOMEN AND PELVIS WITHOUT CONTRAST TECHNIQUE: Multidetector CT imaging of the abdomen and pelvis was performed following the standard protocol without IV contrast. COMPARISON:  06/04/2015. FINDINGS: Lower chest: Motion artifact with some atelectasis in the dependent bases. Hepatobiliary: No focal abnormality in the liver on this study without intravenous contrast. No evidence of hepatomegaly. Gallbladder is surgically absent. No intrahepatic or extrahepatic biliary dilation. Pancreas: No focal mass lesion. No dilatation of the main duct. No intraparenchymal cyst. No peripancreatic edema.  Spleen: No splenomegaly. No focal mass  lesion. Adrenals/Urinary Tract: No adrenal nodule or mass. No mass lesion evident in either kidney on this study without intravenous contrast material. No evidence for hydroureteronephrosis. The urinary bladder appears normal for the degree of distention. Stomach/Bowel: Small hiatal hernia noted. Metallic foreign body identified along the posterior wall of the gastric fundus. This is been present on multiple prior studies is well. Duodenum is normally positioned as is the ligament of Treitz. No small bowel wall thickening. No small bowel dilatation. The terminal ileum is normal. The appendix is not visualized, but there is no edema or inflammation in the region of the cecum. Diverticuli are seen scattered along the entire length of the colon without CT findings of diverticulitis. Small bowel adhesions to the anterior abdominal wall again noted. There is soft tissue attenuation in the midline anterior abdominal wall, at the site of previous surgical wound. This is the area where enterocutaneous fistula was visualized on the previous study. No gas in this region today but no oral contrast was administered to allow assessment of the fistula. Vascular/Lymphatic: There is abdominal aortic atherosclerosis without aneurysm. There is no gastrohepatic or hepatoduodenal ligament lymphadenopathy. No intraperitoneal or retroperitoneal lymphadenopy. No pelvic sidewall lymphadenopathy. Reproductive: Uterus is surgically absent. There is no adnexal mass. Other: No intraperitoneal free fluid. Musculoskeletal: Bone windows reveal no worrisome lytic or sclerotic osseous lesions. IMPRESSION: Stable exam. No new or acute interval findings. There is no gas in the region of the chronic enterocutaneous fistula no evidence to suggest interval development of an abscess in this region. Fistula not well assessed given the lack of oral contrast on today's study. No evidence for bowel obstruction. No  intraperitoneal fluid collection and no intraperitoneal free fluid. Electronically Signed   By: Misty Stanley M.D.   On: 07/13/2015 13:27   Dg Chest 2 View  07/13/2015  CLINICAL DATA:  Emesis.  CHF. EXAM: CHEST  2 VIEW COMPARISON:  06/04/2015 chest radiograph FINDINGS: Right PICC terminates in the lower third of the superior vena cava. Stable cardiomediastinal silhouette with mild cardiomegaly. No pneumothorax. No pleural effusion. Clear lungs, with no focal lung consolidation and no pulmonary edema. IMPRESSION: Stable mild cardiomegaly without pulmonary edema. Lungs appear clear. Electronically Signed   By: Ilona Sorrel M.D.   On: 07/13/2015 12:43   US Abdomen Complete  07/21/2015  CLINICAL DATA:  Elevated liver function studies. EXAM: ULTRASOUND ABDOMEN COMPLETE COMPARISON:  Multiple prior ultrasound and CT examinations. FINDINGS: Gallbladder: Surgically absent Common bile duct: Diameter: 7.1 mm Liver: Normal echogenicity without focal lesion or biliary dilatation. IVC: Normal caliber Pancreas: Poorly visualized but normal on recent CT scans. Spleen: Normal size.  No focal lesions. Right Kidney: Length: 10.4 cm. Mild renal cortical thinning but no mass or hydronephrosis. Left Kidney: Length: 10.3 cm. Mild renal cortical thinning but no mass or hydronephrosis. Abdominal aorta: Normal caliber Other findings: None. IMPRESSION: Status post cholecystectomy with mild associated common bile duct dilatation. Normal sonographic appearance of the liver. No intrahepatic biliary dilatation. Poor visualization of the pancreas but it was normal on recent CT scans. Electronically Signed   By: Marijo Sanes M.D.   On: 07/21/2015 23:43   US Renal  07/17/2015  CLINICAL DATA:  Renal insufficiency EXAM: RENAL ULTRASOUND COMPARISON:  Renal ultrasound June 08, 2015; CT abdomen and pelvis July 13, 2015 FINDINGS: Right Kidney: Length: 11.1 cm. Echogenicity and renal cortical thickness are within normal limits. No  perinephric fluid or hydronephrosis visualized. There is a cyst in the upper pole region measuring 1.5  x 1.5 x 1.5 cm. No sonographically demonstrable calculus or ureterectasis. Left Kidney: Length: 10.8 cm. Echogenicity and renal cortical thickness are within normal limits. No mass, perinephric fluid, or hydronephrosis visualized. No sonographically demonstrable calculus. Bladder: Completely empty and cannot be assessed. IMPRESSION: Cyst arising from upper pole right kidney. Study otherwise unremarkable. Note the urinary bladder is empty and cannot be assessed. Electronically Signed   By: Lowella Grip III M.D.   On: 07/17/2015 11:45   Dg Ugi W/small Bowel  07/20/2015  CLINICAL DATA:  74 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 Prior repair. Question level of fistula. Subsequent encounter. EXAM: UPPER GI SERIES WITH SMALL BOWEL FOLLOW-THROUGH FLUOROSCOPY TIME:  Radiation Exposure Index (as provided by the fluoroscopic device): 1634.12 micro Gy TECHNIQUE: Water-soluble upper GI series followed by small bowel were obtained after discussion with Dr. Kieth Brightly. COMPARISON:  None. FINDINGS: Draining clips are noted on scout view. Patient was only able to ingest a small amount of Omnipaque 300 at one time. Total of 150 cc was ingested. The present examination was specifically tailored to evaluate for level of the enterocutaneous fistula. Gastroesophageal reflux noted. Patient was kept in the upright position to avoid aspiration. Scattered duodenal diverticulum. Nonspecific eggshell calcification right upper quadrant. Enterocutaneous fistulas best seen on lateral overhead imaging. This appears to arise from the proximal to mid ileum. Distal aspect of the small bowel not visualized. Patient was tired of the exam at this point. Study terminated. IMPRESSION: Enterocutaneous fistulas best seen on lateral overhead imaging. This appears to arise from the proximal to mid ileum. Exam otherwise limited as noted  above. Electronically Signed   By: Genia Del M.D.   On: 07/20/2015 15:46    CBC  Recent Labs Lab 07/19/15 0415 07/20/15 1020 07/23/15 0615  WBC 7.1 7.9 7.9  HGB 10.3* 9.8* 8.3*  HCT 30.6* 28.6* 27.7*  PLT 251 242 247  MCV 71.8* 70.6* 80.5  MCH 24.2* 24.2* 24.1*  MCHC 33.7 34.3 30.0  RDW 15.3 14.6 14.3  LYMPHSABS  --  1.0  --   MONOABS  --  0.9  --   EOSABS  --  0.5  --   BASOSABS  --  0.1  --     Chemistries   Recent Labs Lab 07/20/15 1020 07/21/15 0610 07/22/15 0352 07/23/15 0615 07/24/15 0507  NA 145 147* 147* 141 142  K 2.9* 3.0* 3.4* 3.3* 3.3*  CL 98* 94* 99* 97* 103  CO2 33* 39* 34* 33* 33*  GLUCOSE 164* 162* 134* 127* 144*  BUN 138* 103* 76* 54* 42*  CREATININE 3.70* 3.23* 3.08* 2.92* 2.59*  CALCIUM 9.3 8.4* 8.5* 8.5* 8.5*  MG 2.1  --   --  1.6* 1.7  AST 99*  --  93* 70*  --   ALT 135*  --  161* 136*  --   ALKPHOS 174*  --  146* 137*  --   BILITOT 0.9  --  0.9 1.0  --    ------------------------------------------------------------------------------------------------------------------ estimated creatinine clearance is 22.3 mL/min (by C-G formula based on Cr of 2.59). ------------------------------------------------------------------------------------------------------------------ No results for input(s): HGBA1C in the last 72 hours. ------------------------------------------------------------------------------------------------------------------ No results for input(s): CHOL, HDL, LDLCALC, TRIG, CHOLHDL, LDLDIRECT in the last 72 hours. ------------------------------------------------------------------------------------------------------------------ No results for input(s): TSH, T4TOTAL, T3FREE, THYROIDAB in the last 72 hours.  Invalid input(s): FREET3 ------------------------------------------------------------------------------------------------------------------ No results for input(s): VITAMINB12, FOLATE, FERRITIN, TIBC, IRON, RETICCTPCT in the  last 72 hours.  Coagulation profile No results for input(s): INR, PROTIME in the last  168 hours.  No results for input(s): DDIMER in the last 72 hours.  Cardiac Enzymes No results for input(s): CKMB, TROPONINI, MYOGLOBIN in the last 168 hours.  Invalid input(s): CK ------------------------------------------------------------------------------------------------------------------ Invalid input(s): [place]  07/22/15 2143 07/23/15 0748 07/23/15 1212 07/23/15 1653 07/23/15 2024 07/24/15 0726  GLUCAP 125* 133* 98 111* 145* 146*     Isador Castille M.D. [facility] 07/24/2015, 11:05 AM  Pager: [phone] Between 7am to 7pm - call Pager - [phone]  After 7pm go to [email or web address] - password [email or web address]  Call night coverage person covering after 7pm

## 2015-07-24 NOTE — Clinical Social Work Note (Signed)
CSW continuing to follow to assist with discharge once patient medically stable.  Lubertha Sayres, [facility]: [phone] Surgical: [phone]

## 2015-07-24 NOTE — Progress Notes (Signed)
Patient ID: Marissa [name], female   DOB: 10/07/1940, 74 y.o.   MRN: [id number]    Subjective: Pt with no new complaints today  Objective: Vital signs in last 24 hours: Temp:  [98.8 F (37.1 C)] 98.8 F (37.1 C) (11/10 1015) Pulse Rate:  [99-104] 99 (11/11 0533) Resp:  [18-19] 18 (11/11 0533) BP: (108-135)/(45-88) 120/88 mmHg (11/11 0533) SpO2:  [97 %-100 %] 99 % (11/11 0533) Last BM Date: 07/21/15  Intake/Output from previous day: 11/10 0701 - 11/11 0700 In: 1476.3 [I.V.:935; TPN:541.3] Out: 900 [Urine:200; Drains:400] Intake/Output this shift: Total I/O In: 0  Out: 100 [Urine:100]  PE: Abd: soft, Eakin's pouch in place with bilious output, 700cc yesterday, obese  Lab Results:   Recent Labs  07/23/15 0615  WBC 7.9  HGB 8.3*  HCT 27.7*  PLT 247   BMET  Recent Labs  07/23/15 0615 07/24/15 0507  NA 141 142  K 3.3* 3.3*  CL 97* 103  CO2 33* 33*  GLUCOSE 127* 144*  BUN 54* 42*  CREATININE 2.92* 2.59*  CALCIUM 8.5* 8.5*   PT/INR No results for input(s): LABPROT, INR in the last 72 hours. CMP     Component Value Date/Time   NA 142 07/24/2015 0507   K 3.3* 07/24/2015 0507   CL 103 07/24/2015 0507   CO2 33* 07/24/2015 0507   GLUCOSE 144* 07/24/2015 0507   BUN 42* 07/24/2015 0507   CREATININE 2.59* 07/24/2015 0507   CREATININE 1.64* 06/27/2014 1647   CALCIUM 8.5* 07/24/2015 0507   PROT 6.0* 07/23/2015 0615   ALBUMIN 2.2* 07/23/2015 0615   AST 70* 07/23/2015 0615   ALT 136* 07/23/2015 0615   ALKPHOS 137* 07/23/2015 0615   BILITOT 1.0 07/23/2015 0615   GFRNONAA 17* 07/24/2015 0507   GFRAA 20* 07/24/2015 0507   Lipase     Component Value Date/Time   LIPASE 27 12/26/2014 1815       Studies/Results: No results found.  Anti-infectives: Anti-infectives    Start     Dose/Rate Route Frequency Ordered Stop   07/23/15 1000  vancomycin (VANCOCIN) 1,250 mg in sodium chloride 0.9 % 250 mL IVPB     1,250 mg 166.7 mL/hr over 90 Minutes Intravenous  Every 48 hours 07/23/15 0908     07/16/15 1000  vancomycin (VANCOCIN) 1,500 mg in sodium chloride 0.9 % 500 mL IVPB  Status:  Discontinued     1,500 mg 250 mL/hr over 120 Minutes Intravenous Every 48 hours 07/14/15 0956 07/15/15 1035   07/16/15 1000  vancomycin (VANCOCIN) IVPB 1000 mg/200 mL premix  Status:  Discontinued     1,000 mg 200 mL/hr over 60 Minutes Intravenous Every 48 hours 07/15/15 1035 07/20/15 1242   07/14/15 1500  cefTRIAXone (ROCEPHIN) 1 g in dextrose 5 % 50 mL IVPB  Status:  Discontinued     1 g 100 mL/hr over 30 Minutes Intravenous Every 24 hours 07/13/15 1647 07/17/15 1113   07/14/15 1015  vancomycin (VANCOCIN) 1,500 mg in sodium chloride 0.9 % 500 mL IVPB     1,500 mg 250 mL/hr over 120 Minutes Intravenous NOW 07/14/15 0943 07/14/15 1210   07/13/15 1400  cefTRIAXone (ROCEPHIN) 1 g in dextrose 5 % 50 mL IVPB     1 g 100 mL/hr over 30 Minutes Intravenous  Once 07/13/15 1359 07/13/15 1526       Assessment/Plan  ECF-eakin's pouch, appreciate WOC management. -accurate I&Os -sandostatin/loperamide to slow down transit -cont strict NPO as output is still relatively high. 1100 -->  700cc. Give 0.5L bolus -SBFT did show EC fistula to be fairly distal in the proximal to mid ileum -Dr. Kieth Brightly and Dr. Georgette Dover have discussed this case.  Her prealbumin last was 35.  Would like for bacteremia to be treated, but considering surgical options.  That would not be this week, but possibly next week. They are all looking at their schedules and this will be up in the air for now until better plans can be determined. ID-BCs with [facility]. repeat Cx 12/7 are negative to date.PICC placed yesterday and TNA restarted  FEN-NPO.  PCM- prealbumin 35.1(11/7). Resume TNA Acute on chronic renal failure- per medicine and renal, Cr improved to 2.5 today VTE prophylaxis-SCD/heparin    LOS: 11 days    Marissa [name] E 07/24/2015, 8:44 AM Pager: [phone]

## 2015-07-24 NOTE — Progress Notes (Signed)
Patient refused CPAP for the night  

## 2015-07-24 NOTE — Progress Notes (Signed)
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  No Known Allergies  Patient Measurements: Height: _0  (162.6 cm) Weight: 227 lb 9.6 oz (103.239 kg) IBW/kg (Calculated) : 54.7 Adjusted Body Weight:  Usual Weight:   Vital Signs: BP: 120/88 mmHg (11/11 0533) Pulse Rate: 99 (11/11 0533) Intake/Output from previous day: 11/10 0701 - 11/11 0700 In: 1476.3 [I.V.:935; TPN:541.3] Out: 500 [Urine:200] Intake/Output from this shift:    Labs:  Recent Labs  07/23/15 0615  WBC 7.9  HGB 8.3*  HCT 27.7*  PLT 247     Recent Labs  07/22/15 0352 07/23/15 0615 07/24/15 0507  NA 147* 141 142  K 3.4* 3.3* 3.3*  CL 99* 97* 103  CO2 34* 33* 33*  GLUCOSE 134* 127* 144*  BUN 76* 54* 42*  CREATININE 3.08* 2.92* 2.59*  CALCIUM 8.5* 8.5* 8.5*  MG  --  1.6* 1.7  PHOS  --  3.4 3.8  PROT 6.9 6.0*  --   ALBUMIN 2.6* 2.2*  --   AST 93* 70*  --   ALT 161* 136*  --   ALKPHOS 146* 137*  --   BILITOT 0.9 1.0  --   BILIDIR 0.3  --   --   IBILI 0.6  --   --    Estimated Creatinine Clearance: 22.3 mL/min (by C-G formula based on Cr of 2.59).    Recent Labs  07/23/15 1212 07/23/15 1653 07/23/15 2024  GLUCAP 98 111* 145*    Medications:  Scheduled:  . heparin  5,000 Units Subcutaneous 3 times per day  . insulin aspart  0-9 Units Subcutaneous 4 times per day  . loperamide  2 mg Oral 4 times per day  . nystatin  5 mL Oral QID  . octreotide  100 mcg Subcutaneous Q12H  . pantoprazole (PROTONIX) IV  40 mg Intravenous Q24H  . vancomycin  1,250 mg Intravenous Q48H    Insulin Requirements in the past 12 hours:  2 units SSI  Nutritional Goals: per RD note 11/10 1700-1900 kCal, 100-110 grams of protein per day  Current Nutrition:  NPO  Clinimix E 5/15 at 40 cc/hr + 20% IVFE at 7 ml/hr provides 48g protein and 1018 kcal  [facility], was receiving cycled Clinimix 5/15 alternated  with E 5/15, 2049m over 12 hours (2000-0800). [place] [facility] was receiving TPN during her entire stay, however patient's admission labs/dehydrated state as well as report from daughter make this questionable.  Assessment: 74YOF on chronic TPN for a fistula [facility] days of vomiting, increased drainage from fistula and severe dehydration. Her EC output was bilious and Eakin's pouch was removed at facility. CT scan negative for infection however blood cultures from 10/31 were 2/2 CoNS. TPN held for line holiday and PICC was removed. Repeat blood cultures have been clear - and a new PICC line is scheduled to be placed today.   Surgeries/Procedures: 04/28/15: wound exploration, explanation of abdominal mesh Multiple other abdominal surgeries  GI:  Eakin pouch replaced- with suction. Wound care following.. NPO since 11/4 for line holiday with TPN held for PICC line infection. New PICC line to be placed today and TPN resumed. Since off for almost 1 week, will start at a lower rate and titrate to goal - then will plan to cycle as long as the patient is tolerating. ECF output/24h: 700 cc. Continues on  loperamide, octreotide, PPI IV. Pre-albumin 35.1 (11/7) Endo: A1C this admission is 6.2 (up from 5.7 in July 2016) CBGs/12h: 145-146. On sensitive SSI Lytes: Na 142, K 3.3, Phos 3.8, Mg 1.7, CoCa~9.8.  Will replace Mg again today - since CrCl<30 ml/min, will leave KCl replacement up to MD. It appears as if the patient's home TPN was alternating between lytes and no lytes - will keep lytes in the TPN today since on the lower side.  Renal: AoCKD III d/t dehydration - improving. SCr 2.59 << 2.92, CrCl~20-25 ml/min, BUN down to 42 << 54, UOP not accurately charted. On D5-1/2NS at 85 cc/hr.  Hepatobil: LFTs trending down, 70/136 << 93/161, T bili wnl, Alk phos 137 << 146, TG 84 (11/7) Pulm:  OSA.  RA. CPAP at night Cards: EF 55%; VSS- no  meds Neuro: Pt more alert ID: Vanc for continued treatment of CoNS PICC line infection - now removed and to be replaced. S/p CTX for UTI, on nystatin for thrush. Will likely need 2 weeks of Vanc from the 1st negative BCx on 11/7. VR 15 mcg/ml on 11/10 down from 32 mcg/ml on 11/8 - calculated ke~0.0158, Vanc restarted on 11/10 AM however since renal function continuing to improve, will plan to check a VR on 11/12 AM to determine if the interval needs to be shortened. Afebrile, WBC wnl Best Practices Hep SQ TPN Access: PICC to be placed 11/10 TPN start date: chronic TPN- was on at home prior to admission in Sept, and continued after discharge. resumed 11/1 at the hospital  Plan: - Increase Clinimix E 5/15 to goal rate of 83 ml/hr and increase 20% IVFE to 10 ml/hr to provide 99.6g protein and 1894 kcal (100% of protein/kcal goal) - Will add TE and MV to TPN bag - Continue with sensitive SSI as ordered - Magnesium 1g IV x 1 dose - Will reduce IVF to 40 ml/hr when TPN is started this evening - If the patient tolerates the full TPN rate - will plan to start cycling - Since renal function improving steadily, will check a VR on 11/12 AM to determine if the Vancomycin dosing interval needs to be shortened.  - Will f/u BMET, Mg, in the AM  Alycia Rossetti, PharmD, BCPS Clinical Pharmacist Pager: [phone] 07/24/2015 7:22 AM

## 2015-07-25 ENCOUNTER — Inpatient Hospital Stay (HOSPITAL_COMMUNITY): Payer: Medicare Other

## 2015-07-25 DIAGNOSIS — R609 Edema, unspecified: Secondary | ICD-10-CM

## 2015-07-25 LAB — BASIC METABOLIC PANEL
Anion gap: 6 (ref 5–15)
BUN: 43 mg/dL — ABNORMAL HIGH (ref 6–20)
CO2: 30 mmol/L (ref 22–32)
Calcium: 8.2 mg/dL — ABNORMAL LOW (ref 8.9–10.3)
Chloride: 101 mmol/L (ref 101–111)
Creatinine, Ser: 2.61 mg/dL — ABNORMAL HIGH (ref 0.44–1.00)
GFR calc Af Amer: 20 mL/min — ABNORMAL LOW (ref >60)
GFR calc non Af Amer: 17 mL/min — ABNORMAL LOW (ref >60)
Glucose, Bld: 145 mg/dL — ABNORMAL HIGH (ref 65–99)
Potassium: 3.5 mmol/L (ref 3.5–5.1)
Sodium: 137 mmol/L (ref 135–145)

## 2015-07-25 LAB — GLUCOSE, CAPILLARY
GLUCOSE-CAPILLARY: 94 mg/dL (ref 65–99)
GLUCOSE-CAPILLARY: 92 mg/dL (ref 65–99)
Glucose-Capillary: 142 mg/dL — ABNORMAL HIGH (ref 65–99)
Glucose-Capillary: 122 mg/dL — ABNORMAL HIGH (ref 65–99)

## 2015-07-25 LAB — CULTURE, BLOOD (ROUTINE X 2): CULTURE: NO GROWTH

## 2015-07-25 LAB — VANCOMYCIN, RANDOM: Vancomycin Rm: 19 ug/mL

## 2015-07-25 LAB — MAGNESIUM: MAGNESIUM: 2 mg/dL (ref 1.7–2.4)

## 2015-07-25 MED ORDER — FAT EMULSION 20 % IV EMUL
234.0000 mL | INTRAVENOUS | Status: DC
  Filled 2015-07-25 (×2): qty 250

## 2015-07-25 MED ORDER — HYDROCERIN EX CREA
TOPICAL_CREAM | Freq: Every day | CUTANEOUS | Status: DC | PRN
  Administered 2015-07-25: 19:00:00 via TOPICAL
  Filled 2015-07-25 (×2): qty 113

## 2015-07-25 MED ORDER — WHITE PETROLATUM GEL
Status: AC
  Administered 2015-07-25: 19:00:00
  Filled 2015-07-25: qty 1

## 2015-07-25 MED ORDER — TRACE MINERALS CR-CU-MN-SE-ZN 10-1000-500-60 MCG/ML IV SOLN
INTRAVENOUS | Status: DC
  Filled 2015-07-25 (×2): qty 1988

## 2015-07-25 NOTE — Progress Notes (Signed)
PARENTERAL NUTRITION and VANCOMYCIN CONSULT NOTE - FOLLOW UP  Pharmacy Consult for TPN Indication: Enterocutaneous Fistula & CoNS PICC line infection  No Known Allergies  Patient Measurements: Height: 5\' 4"  (162.6 cm) Weight: 226 lb 3.1 oz (102.6 kg) IBW/kg (Calculated) : 54.7    Vital Signs: Temp: 98.7 F (37.1 C) (11/12 0616) Temp Source: Oral (11/12 0616) BP: 117/48 mmHg (11/12 0616) Pulse Rate: 92 (11/12 0616) Intake/Output from previous day: 11/11 0701 - 11/12 0700 In: 2243.6 [I.V.:374.7; TPN:1869] Out: 800 [Urine:100; Drains:700] Intake/Output from this shift:    Labs:  Recent Labs  07/23/15 0615  WBC 7.9  HGB 8.3*  HCT 27.7*  PLT 247     Recent Labs  07/23/15 0615 07/24/15 0507 07/25/15 0449  NA 141 142 137  K 3.3* 3.3* 3.5  CL 97* 103 101  CO2 33* 33* 30  GLUCOSE 127* 144* 145*  BUN 54* 42* 43*  CREATININE 2.92* 2.59* 2.61*  CALCIUM 8.5* 8.5* 8.2*  MG 1.6* 1.7 2.0  PHOS 3.4 3.8  --   PROT 6.0*  --   --   ALBUMIN 2.2*  --   --   AST 70*  --   --   ALT 136*  --   --   ALKPHOS 137*  --   --   BILITOT 1.0  --   --    Estimated Creatinine Clearance: 22.1 mL/min (by C-G formula based on Cr of 2.61).    Recent Labs  07/24/15 1521 07/24/15 2005 07/25/15 0808  GLUCAP 153* 164* 142*   Insulin Requirements in the past 12 hours:  5 units SSI  Nutritional Goals: per RD note 11/10 1700-1900 kCal, 100-110 grams of protein per day  Current Nutrition:  NPO  Clinimix E 5/15 at 80 cc/hr + 20% IVFE at 10 ml/hr   Per discussions/notes from [facility], was receiving cycled Clinimix 5/15 alternated with E 5/15, 2059ml over 12 hours (2000-0800). Per RN from facility [place], patient was receiving TPN during her entire stay, however patient's admission labs/dehydrated state as well as report from daughter make this questionable.  Assessment: 46 YOF on chronic TPN for a fistula who was admitted from [facility] with lethargy, 2  days of vomiting, increased drainage from fistula and severe dehydration. Her EC output was bilious and Eakin's pouch was removed at facility. CT scan negative for infection however blood cultures from 10/31 were 2/2 CoNS. TPN held for line holiday and PICC was removed. Repeat blood cultures have been clear - and a new PICC was placed 11/10 to resume TPN.  Surgeries/Procedures: 04/28/15: wound exploration, explanation of abdominal mesh Multiple other abdominal surgeries  GI:  Eakin pouch replaced- with suction. Wound care following.. NPO since 11/4 for line holiday with TPN held for PICC line infection. New PICC line  Placed 11/10 and TPN resumed.  ECF output/24h: 700 cc. Continues on loperamide, octreotide, PPI IV. Pre-albumin 35.1 (11/7) Endo: A1C this admission is 6.2 (up from 5.7 in July 2016) CBGs/12h: 142 - 164. On sensitive SSI Lytes:K 3.5 after 2 runs yest, Phos 3.8, Mg up to 2 after 1 gm bolus yest; , CoCa~9.7 It appears as if the patient's home TPN was alternating between lytes and no lytes - will keep lytes in the TPN today since on the lower side.  Renal: AoCKD III d/t dehydration - improving/stable SCr 2.61, CrCl~20-25 ml/min, BUN down to 43 << 54, UOP not accurately charted. On D5-1/2NS at 40 cc/hr.  Hepatobil: LFTs trending  down, 70/136 << 93/161, T bili wnl, Alk phos 137 << 146, TG 84 (11/7) Pulm:  OSA.  RA. CPAP at night Cards: EF 55%; VSS- no meds Neuro: Pt more alert ID: Vanc for continued treatment of CoNS PICC line infection - s/p removal 11/4 and replaced 11/10. S/p CTX for UTI, on nystatin for thrush.  Vanc therapy planned for 2 weeks s/p first neg culture: stop date is 08/02/15 VR 15 mcg/ml on 11/10 down from 32 mcg/ml on 11/8 - calculated ke~0.0158, Vanc restarted on 11/10 AM  11/12 VR = 19.5 drawn 41. 5 hours after last dose. Vanc trough goal is 15-20 mcg/ml.  Afebrile, WBC wnl Best Practices Hep SQ TPN Access: PICC placed 11/10 for TPN TPN start date: chronic TPN- was  on at home prior to admission in Sept, and continued after discharge. resumed 11/1 at the hospital  Plan: - continue vancomycin 1250 mg IV q48h - changed TPN rate to cyclic over 18 hours:  Clinimix E 5/15 1988 mls/ over 18 hours andr  20% IVFE 232 mls over 18 hours to provide 99.4 protein and 1880 kcal (~100% of protein/kcal goal) - continue TE and MV to TPN bag - Continue with sensitive SSI as ordered - check BMET and mag in AM  Eudelia Bunch, Pharm.D. [phone] 07/25/2015 10:27 AM

## 2015-07-25 NOTE — Progress Notes (Signed)
[facility]                                                                              Patient Demographics  Mieke Mauss, is a 74 y.o. female, DOB - 1940-11-26, OZ:[id number]  Admit date - 07/13/2015   Admitting Physician Waldemar Dickens, MD  Outpatient Primary MD for the patient is [name], MD  LOS - 12   Chief Complaint  Patient presents with  . Emesis       Brief HPI    74 y.o. female, with chronic kidney disease, obstructive sleep apnea, diastolic heart failure, type 2 diabetes, multiple abdominal surgeries, and a recent discharge after care of an enterocutaneous fistula, presented to the emergency department from [facility] with lethargy, 2 days of vomiting and severe dehydration. On 04/28/15 the patient underwent abdominal wound exploration of enterocutaneous fistula and abscess by Dr. Georgette Dover. She was admitted to the hospital on 9/22 with increased ostomy output and confusion. She was treated with TPN and Sandostatin. An eakin pouch was placed. Once she stabilized she was discharged on 10/4 to SNF. [facility] Kitchen Per the patient's daughter, Dr. Georgette Dover felt the patient could begin to take oral food & drink approximately 2 weeks ago. The patient's TPN was discontinued at some point after that. The patient ate fairly well for 4-5 days but then decreased her oral intake. One week back patient's enterocutaneous fistula began to drain green bile once again. Her stools became the color of green bile. Yesterday prior to admission patient had several episodes of biliary vomiting. In the emergency department the patient's bicarbonate was 12, her creatinine is elevated to 3.44 from 1.75, her BUN is 160, sodium 129. Lactic acid was normal. UA positive for UTI. Patient admitted to hospitalist service and general surgery following.  Assessment & Plan   Acute on chronic kidney injury with metabolic acidosis- improving, prerenal secondary to dehydration. Has underlying  chronic kidney disease stage III.  - Given Ringer's lactate followed by D5 with bicarbonate and now getting D5 with half-normal saline as bicarbonate has significantly improved. Renal output and AKI improving - Discontinued indomethacin, colchicine. Avoid all nephrotoxic agents. - not a candidate for HD.   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 surgery following. - NPO, PICC line placed, on TPN, blood cultures from 11/7 and 11/18 remains negative - Eakin pounds placed to intermittent wall suction. Wound care following. - Continue Sandostatin. - Gen. surgery considering surgical options, possibly next week  Coag negative staph bacteremia with sepsis 2/2 on admission - Placed on empiric IV vancomycin. Repeat blood culture from 11/3 still positive for coag-negative bacteremia. PICC line discontinued and repeat blood culture were sent.  - Dr Clementeen Graham  discussed with ID, Dr. Linus Salmons. Recommend 2 week course of IV vancomycin. ( stop date 11/20 from the clear blood cultures) - Repeat blood cultures 11/7 negative so far, PICC line placed on 11/10  Proteus UTI Pansensitive. completed 5 days of empiric Rocephin.  transaminitis  LFTs improving Abdominal ultrasound showed mild associated CBD dilatation, cholecystectomy, normal sonographic appearance of liver, no intrahepatic dilatation.  Acute  encephalopathy - Possible secondary to sepsis and dehydration. Appears improved.  Hypokalemia Replace IV  Hyponatremia improving  Oral thrush Continue nystatin  Chronic diastolic CHF EF of [id number]. Currently hypovolemic and on gentle hydration.  Obstructive sleep apnea Continue bedtime CPAP  Prediabetes monitor with SSI  Severe malnutrition with failure to thrive TPN restarted  OSA On CPAP.  Code Status:DO NOT RESUSCITATE  family Communication: Discussed in detail with the patient, all imaging results, lab results explained to the  patient    Disposition Plan: Start physical therapy No changes today.  Time Spent in minutes  25 minutes  Procedures  CT abd   Consults Nephrology General surgery  ID  DVT Prophylaxis heparin subcutaneous   Medications  Scheduled Meds: . heparin  5,000 Units Subcutaneous 3 times per day  . insulin aspart  0-9 Units Subcutaneous 4 times per day  . loperamide  2 mg Oral 4 times per day  . nystatin  5 mL Oral QID  . octreotide  100 mcg Subcutaneous Q12H  . pantoprazole (PROTONIX) IV  40 mg Intravenous Q24H  . vancomycin  1,250 mg Intravenous Q48H   Continuous Infusions: . Marland KitchenTPN (CLINIMIX-E) Adult     And  . fat emulsion    . dextrose 5 % and 0.45% NaCl 40 mL/hr at 07/24/15 1738  . Marland KitchenTPN (CLINIMIX-E) Adult 83 mL/hr at 07/24/15 1731   And  . fat emulsion 240 mL (07/24/15 1731)   PRN Meds:.diphenhydrAMINE, diphenhydrAMINE-zinc acetate, Glycerin (Adult), hydrocerin, hydrOXYzine, iohexol, morphine injection, nitroGLYCERIN, ondansetron **OR** ondansetron (ZOFRAN) IV, oxyCODONE, sodium chloride, sodium chloride   Antibiotics   Anti-infectives    Start     Dose/Rate Route Frequency Ordered Stop   07/23/15 1000  vancomycin (VANCOCIN) 1,250 mg in sodium chloride 0.9 % 250 mL IVPB     1,250 mg 166.7 mL/hr over 90 Minutes Intravenous Every 48 hours 07/23/15 0908     07/16/15 1000  vancomycin (VANCOCIN) 1,500 mg in sodium chloride 0.9 % 500 mL IVPB  Status:  Discontinued     1,500 mg 250 mL/hr over 120 Minutes Intravenous Every 48 hours 07/14/15 0956 07/15/15 1035   07/16/15 1000  vancomycin (VANCOCIN) IVPB 1000 mg/200 mL premix  Status:  Discontinued     1,000 mg 200 mL/hr over 60 Minutes Intravenous Every 48 hours 07/15/15 1035 07/20/15 1242   07/14/15 1500  cefTRIAXone (ROCEPHIN) 1 g in dextrose 5 % 50 mL IVPB  Status:  Discontinued     1 g 100 mL/hr over 30 Minutes Intravenous Every 24 hours 07/13/15 1647 07/17/15 1113   07/14/15 1015  vancomycin (VANCOCIN) 1,500 mg in  sodium chloride 0.9 % 500 mL IVPB     1,500 mg 250 mL/hr over 120 Minutes Intravenous NOW 07/14/15 0943 07/14/15 1210   07/13/15 1400  cefTRIAXone (ROCEPHIN) 1 g in dextrose 5 % 50 mL IVPB     1 g 100 mL/hr over 30 Minutes Intravenous  Once 07/13/15 1359 07/13/15 1526        Subjective:   Kennan Stankowski was seen and examined today. No fevers. Patient denies dizziness, chest pain, shortness of breath, new weakness, numbess, tingling. No acute events overnight. No acute issues currently  Objective:   Blood pressure 117/48, pulse 92, temperature 98.7 F (37.1 C), temperature source Oral, resp. rate 16, height 5\' 4"  (1.626 m), weight 102.6 kg (226 lb 3.1 oz), SpO2 100 %.  Wt Readings from Last 3 Encounters:  07/25/15 102.6 kg (226 lb 3.1 oz)  06/15/15  102.7 kg (226 lb 6.6 oz)  05/01/15 99.3 kg (218 lb 14.7 oz)     Intake/Output Summary (Last 24 hours) at 07/25/15 1104 Last data filed at 07/25/15 0500  Gross per 24 hour  Intake 2243.62 ml  Output    700 ml  Net 1543.62 ml    Exam  General: Alert and oriented, NAD  HEENT:  PERRLA, EOMI, Anicteric Sclera  Neck: Supple, no JVD, no masses  CVS: S1 S2 clear, RRR  Respiratory: CTAB  Abdomen: Soft, midline wound, eakin pouch to wall suction   Ext: no c/c/e   Neuro: no new deficits  Skin: No rashes  Psych: Normal affect and demeanor, alert    Data Review   Micro Results Recent Results (from the past 240 hour(s))  Culture, blood (routine x 2)     Status: None   Collection Time: 07/16/15  5:10 AM  Result Value Ref Range Status   Specimen Description BLOOD LEFT HAND  Final   Special Requests IN PEDIATRIC BOTTLE 2.5CC  Final   Culture  Setup Time   Final    GRAM POSITIVE COCCI IN CLUSTERS AEROBIC BOTTLE ONLY CRITICAL RESULT CALLED TO, READ BACK BY AND VERIFIED WITH: MIKKI MURPHY @0541  07/17/15 MKELLY    Culture   Final    STAPHYLOCOCCUS SPECIES (COAGULASE NEGATIVE) SUSCEPTIBILITIES PERFORMED ON PREVIOUS  CULTURE WITHIN THE LAST 5 DAYS.    Report Status 07/18/2015 FINAL  Final  Culture, blood (routine x 2)     Status: None   Collection Time: 07/16/15  5:23 AM  Result Value Ref Range Status   Specimen Description BLOOD LEFT HAND  Final   Special Requests IN PEDIATRIC BOTTLE 2CC  Final   Culture NO GROWTH 5 DAYS  Final   Report Status 07/21/2015 FINAL  Final  Culture, blood (routine x 2)     Status: None   Collection Time: 07/20/15  8:04 PM  Result Value Ref Range Status   Specimen Description BLOOD LEFT HAND  Final   Special Requests IN PEDIATRIC BOTTLE [place]  Final   Culture NO GROWTH 5 DAYS  Final   Report Status 07/25/2015 FINAL  Final  Culture, blood (single)     Status: None (Preliminary result)   Collection Time: 07/21/15  6:10 AM  Result Value Ref Range Status   Specimen Description BLOOD LEFT HAND  Final   Special Requests IN PEDIATRIC BOTTLE  1CC  Final   Culture NO GROWTH 4 DAYS  Final   Report Status PENDING  Incomplete    Radiology Reports Ct Abdomen Pelvis Wo Contrast  07/13/2015  CLINICAL DATA:  Subsequent encounter for nausea vomiting with low abdominal pain and increased drainage from fistula. EXAM: CT ABDOMEN AND PELVIS WITHOUT CONTRAST TECHNIQUE: Multidetector CT imaging of the abdomen and pelvis was performed following the standard protocol without IV contrast. COMPARISON:  06/04/2015. FINDINGS: Lower chest: Motion artifact with some atelectasis in the dependent bases. Hepatobiliary: No focal abnormality in the liver on this study without intravenous contrast. No evidence of hepatomegaly. Gallbladder is surgically absent. No intrahepatic or extrahepatic biliary dilation. Pancreas: No focal mass lesion. No dilatation of the main duct. No intraparenchymal cyst. No peripancreatic edema. Spleen: No splenomegaly. No focal mass lesion. Adrenals/Urinary Tract: No adrenal nodule or mass. No mass lesion evident in either kidney on this study without intravenous contrast material.  No evidence for hydroureteronephrosis. The urinary bladder appears normal for the degree of distention. Stomach/Bowel: Small hiatal hernia noted. Metallic foreign body identified along  the posterior wall of the gastric fundus. This is been present on multiple prior studies is well. Duodenum is normally positioned as is the ligament of Treitz. No small bowel wall thickening. No small bowel dilatation. The terminal ileum is normal. The appendix is not visualized, but there is no edema or inflammation in the region of the cecum. Diverticuli are seen scattered along the entire length of the colon without CT findings of diverticulitis. Small bowel adhesions to the anterior abdominal wall again noted. There is soft tissue attenuation in the midline anterior abdominal wall, at the site of previous surgical wound. This is the area where enterocutaneous fistula was visualized on the previous study. No gas in this region today but no oral contrast was administered to allow assessment of the fistula. Vascular/Lymphatic: There is abdominal aortic atherosclerosis without aneurysm. There is no gastrohepatic or hepatoduodenal ligament lymphadenopathy. No intraperitoneal or retroperitoneal lymphadenopy. No pelvic sidewall lymphadenopathy. Reproductive: Uterus is surgically absent. There is no adnexal mass. Other: No intraperitoneal free fluid. Musculoskeletal: Bone windows reveal no worrisome lytic or sclerotic osseous lesions. IMPRESSION: Stable exam. No new or acute interval findings. There is no gas in the region of the chronic enterocutaneous fistula no evidence to suggest interval development of an abscess in this region. Fistula not well assessed given the lack of oral contrast on today's study. No evidence for bowel obstruction. No intraperitoneal fluid collection and no intraperitoneal free fluid. Electronically Signed   By: Misty Stanley M.D.   On: 07/13/2015 13:27   Dg Chest 2 View  07/13/2015  CLINICAL DATA:  Emesis.   CHF. EXAM: CHEST  2 VIEW COMPARISON:  06/04/2015 chest radiograph FINDINGS: Right PICC terminates in the lower third of the superior vena cava. Stable cardiomediastinal silhouette with mild cardiomegaly. No pneumothorax. No pleural effusion. Clear lungs, with no focal lung consolidation and no pulmonary edema. IMPRESSION: Stable mild cardiomegaly without pulmonary edema. Lungs appear clear. Electronically Signed   By: Ilona Sorrel M.D.   On: 07/13/2015 12:43   US Abdomen Complete  07/21/2015  CLINICAL DATA:  Elevated liver function studies. EXAM: ULTRASOUND ABDOMEN COMPLETE COMPARISON:  Multiple prior ultrasound and CT examinations. FINDINGS: Gallbladder: Surgically absent Common bile duct: Diameter: 7.1 mm Liver: Normal echogenicity without focal lesion or biliary dilatation. IVC: Normal caliber Pancreas: Poorly visualized but normal on recent CT scans. Spleen: Normal size.  No focal lesions. Right Kidney: Length: 10.4 cm. Mild renal cortical thinning but no mass or hydronephrosis. Left Kidney: Length: 10.3 cm. Mild renal cortical thinning but no mass or hydronephrosis. Abdominal aorta: Normal caliber Other findings: None. IMPRESSION: Status post cholecystectomy with mild associated common bile duct dilatation. Normal sonographic appearance of the liver. No intrahepatic biliary dilatation. Poor visualization of the pancreas but it was normal on recent CT scans. Electronically Signed   By: Marijo Sanes M.D.   On: 07/21/2015 23:43   US Renal  07/17/2015  CLINICAL DATA:  Renal insufficiency EXAM: RENAL ULTRASOUND COMPARISON:  Renal ultrasound June 08, 2015; CT abdomen and pelvis July 13, 2015 FINDINGS: Right Kidney: Length: 11.1 cm. Echogenicity and renal cortical thickness are within normal limits. No perinephric fluid or hydronephrosis visualized. There is a cyst in the upper pole region measuring 1.5 x 1.5 x 1.5 cm. No sonographically demonstrable calculus or ureterectasis. Left Kidney: Length: 10.8  cm. Echogenicity and renal cortical thickness are within normal limits. No mass, perinephric fluid, or hydronephrosis visualized. No sonographically demonstrable calculus. Bladder: Completely empty and cannot be assessed. IMPRESSION: Cyst arising  from upper pole right kidney. Study otherwise unremarkable. Note the urinary bladder is empty and cannot be assessed. Electronically Signed   By: Lowella Grip III M.D.   On: 07/17/2015 11:45   Dg Ugi W/small Bowel  07/20/2015  CLINICAL DATA:  74 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 Prior repair. Question level of fistula. Subsequent encounter. EXAM: UPPER GI SERIES WITH SMALL BOWEL FOLLOW-THROUGH FLUOROSCOPY TIME:  Radiation Exposure Index (as provided by the fluoroscopic device): 1634.12 micro Gy TECHNIQUE: Water-soluble upper GI series followed by small bowel were obtained after discussion with Dr. Kieth Brightly. COMPARISON:  None. FINDINGS: Draining clips are noted on scout view. Patient was only able to ingest a small amount of Omnipaque 300 at one time. Total of 150 cc was ingested. The present examination was specifically tailored to evaluate for level of the enterocutaneous fistula. Gastroesophageal reflux noted. Patient was kept in the upright position to avoid aspiration. Scattered duodenal diverticulum. Nonspecific eggshell calcification right upper quadrant. Enterocutaneous fistulas best seen on lateral overhead imaging. This appears to arise from the proximal to mid ileum. Distal aspect of the small bowel not visualized. Patient was tired of the exam at this point. Study terminated. IMPRESSION: Enterocutaneous fistulas best seen on lateral overhead imaging. This appears to arise from the proximal to mid ileum. Exam otherwise limited as noted above. Electronically Signed   By: Genia Del M.D.   On: 07/20/2015 15:46    CBC  Recent Labs Lab 07/19/15 0415 07/20/15 1020 07/23/15 0615  WBC 7.1 7.9 7.9  HGB 10.3* 9.8* 8.3*  HCT 30.6*  28.6* 27.7*  PLT 251 242 247  MCV 71.8* 70.6* 80.5  MCH 24.2* 24.2* 24.1*  MCHC 33.7 34.3 30.0  RDW 15.3 14.6 14.3  LYMPHSABS  --  1.0  --   MONOABS  --  0.9  --   EOSABS  --  0.5  --   BASOSABS  --  0.1  --     Chemistries   Recent Labs Lab 07/20/15 1020 07/21/15 0610 07/22/15 0352 07/23/15 0615 07/24/15 0507 07/25/15 0449  NA 145 147* 147* 141 142 137  K 2.9* 3.0* 3.4* 3.3* 3.3* 3.5  CL 98* 94* 99* 97* 103 101  CO2 33* 39* 34* 33* 33* 30  GLUCOSE 164* 162* 134* 127* 144* 145*  BUN 138* 103* 76* 54* 42* 43*  CREATININE 3.70* 3.23* 3.08* 2.92* 2.59* 2.61*  CALCIUM 9.3 8.4* 8.5* 8.5* 8.5* 8.2*  MG 2.1  --   --  1.6* 1.7 2.0  AST 99*  --  93* 70*  --   --   ALT 135*  --  161* 136*  --   --   ALKPHOS 174*  --  146* 137*  --   --   BILITOT 0.9  --  0.9 1.0  --   --    ------------------------------------------------------------------------------------------------------------------ estimated creatinine clearance is 22.1 mL/min (by C-G formula based on Cr of 2.61). ------------------------------------------------------------------------------------------------------------------ No results for input(s): HGBA1C in the last 72 hours. ------------------------------------------------------------------------------------------------------------------ No results for input(s): CHOL, HDL, LDLCALC, TRIG, CHOLHDL, LDLDIRECT in the last 72 hours. ------------------------------------------------------------------------------------------------------------------ No results for input(s): TSH, T4TOTAL, T3FREE, THYROIDAB in the last 72 hours.  Invalid input(s): FREET3 ------------------------------------------------------------------------------------------------------------------ No results for input(s): VITAMINB12, FOLATE, FERRITIN, TIBC, IRON, RETICCTPCT in the last 72 hours.  Coagulation profile No results for input(s): INR, PROTIME in the last 168 hours.  No results for input(s):  DDIMER in the last 72 hours.  Cardiac Enzymes No results for input(s): CKMB, TROPONINI, MYOGLOBIN  in the last 168 hours.  Invalid input(s): CK ------------------------------------------------------------------------------------------------------------------ Invalid input(s): [place]  07/23/15 2024 07/24/15 0726 07/24/15 1117 07/24/15 1521 07/24/15 2005 07/25/15 0808  GLUCAP 145* 146* 120* 153* 164* 142*     Jamareon Shimel M.D. [facility] 07/25/2015, 11:04 AM  Pager: [phone] Between 7am to 7pm - call Pager - [phone]  After 7pm go to [email or web address] - password [email or web address]  Call night coverage person covering after 7pm

## 2015-07-25 NOTE — Progress Notes (Signed)
Notified MD of preliminary vascular  report on left upper extremity venous duplex. Orders received.

## 2015-07-25 NOTE — Progress Notes (Signed)
General Surgery Note  LOS: 12 days  POD -     Assessment/Plan: 1.  Enterocutaneous fistula  Abdominal wound exploration/ explantation of infected abdominal mesh - 04/28/2015 - [facility]  On Vancomycin  Has Eaken's pound around the fistula  Patient is not ambulating at all  2.  Chronic kidney disease  Creat - 2.6 - 07/25/2015 3.  Malnutrition  On TPN 4.  OSA 5.  Anemia - chronic  Hgb - 8.3 - 07/23/2015  6.  DVT prophylaxis - SQ heparin   Principal Problem:   Acute on chronic renal failure (HCC) Active Problems:   OSA (obstructive sleep apnea)   Essential hypertension   Chronic kidney disease, stage 3   Chronic diastolic CHF (congestive heart failure) (HCC)   Enterocutaneous fistula   Type 2 diabetes mellitus without complication (HCC)   Hyponatremia   Dehydration   Metabolic acidosis   Urinary tract infection   Oral thrush   Subjective:  She is not very engaged in her care.  A little difficult to talk to -  Mumbles words.  Appears in no pain.  She has no specific complaint Objective:   Filed Vitals:   07/25/15 0616  BP: 117/48  Pulse: 92  Temp: 98.7 F (37.1 C)  Resp: 16     Intake/Output from previous day:  11/11 0701 - 11/12 0700 In: 2243.6 [I.V.:374.7; TPN:1869] Out: 800 [Urine:100; Drains:700]  Intake/Output this shift:      Physical Exam:   General: Obese AA F who is alert and oriented.    HEENT: Normal. Pupils equal. .   Lungs: Clear   Abdomen: Soft   Wound: Eaken's pouch - 700 cc recorded last 24 hours.   Lab Results:    Recent Labs  07/23/15 0615  WBC 7.9  HGB 8.3*  HCT 27.7*  PLT 247    BMET   Recent Labs  07/24/15 0507 07/25/15 0449  NA 142 137  K 3.3* 3.5  CL 103 101  CO2 33* 30  GLUCOSE 144* 145*  BUN 42* 43*  CREATININE 2.59* 2.61*  CALCIUM 8.5* 8.2*    PT/INR  No results for input(s): LABPROT, INR in the last 72 hours.  ABG  No results for input(s): PHART, HCO3 in the last 72 hours.  Invalid input(s): PCO2,  PO2   Studies/Results:  No results found.   Anti-infectives:   Anti-infectives    Start     Dose/Rate Route Frequency Ordered Stop   07/23/15 1000  vancomycin (VANCOCIN) 1,250 mg in sodium chloride 0.9 % 250 mL IVPB     1,250 mg 166.7 mL/hr over 90 Minutes Intravenous Every 48 hours 07/23/15 0908     07/16/15 1000  vancomycin (VANCOCIN) 1,500 mg in sodium chloride 0.9 % 500 mL IVPB  Status:  Discontinued     1,500 mg 250 mL/hr over 120 Minutes Intravenous Every 48 hours 07/14/15 0956 07/15/15 1035   07/16/15 1000  vancomycin (VANCOCIN) IVPB 1000 mg/200 mL premix  Status:  Discontinued     1,000 mg 200 mL/hr over 60 Minutes Intravenous Every 48 hours 07/15/15 1035 07/20/15 1242   07/14/15 1500  cefTRIAXone (ROCEPHIN) 1 g in dextrose 5 % 50 mL IVPB  Status:  Discontinued     1 g 100 mL/hr over 30 Minutes Intravenous Every 24 hours 07/13/15 1647 07/17/15 1113   07/14/15 1015  vancomycin (VANCOCIN) 1,500 mg in sodium chloride 0.9 % 500 mL IVPB     1,500 mg 250 mL/hr over 120 Minutes  Intravenous NOW 07/14/15 0943 07/14/15 1210   07/13/15 1400  cefTRIAXone (ROCEPHIN) 1 g in dextrose 5 % 50 mL IVPB     1 g 100 mL/hr over 30 Minutes Intravenous  Once 07/13/15 1359 07/13/15 1526      Alphonsa Overall, MD, FACS Pager: [facility] Office: [phone] 07/25/2015

## 2015-07-25 NOTE — Progress Notes (Signed)
VASCULAR LAB PRELIMINARY  PRELIMINARY  PRELIMINARY  PRELIMINARY  Left upper extremity venous duplex completed.    Preliminary report:  There is no DVT noted in the left upper extremity.  There is superficial thrombosis noted in left basilic vein, above the PICC line.  Unable to adequately interrogate PICC area secondary to bandages and patient's pain, however, the thrombus is probably in the vessel at PICC site.   Ajamu Maxon, RVT 07/25/2015, 3:47 PM

## 2015-07-26 LAB — BASIC METABOLIC PANEL
Anion gap: 7 (ref 5–15)
BUN: 48 mg/dL — AB (ref 6–20)
CALCIUM: 8.2 mg/dL — AB (ref 8.9–10.3)
CO2: 27 mmol/L (ref 22–32)
Chloride: 103 mmol/L (ref 101–111)
Creatinine, Ser: 2.88 mg/dL — ABNORMAL HIGH (ref 0.44–1.00)
GFR calc Af Amer: 17 mL/min — ABNORMAL LOW (ref >60)
GFR, EST NON AFRICAN AMERICAN: 15 mL/min — AB (ref >60)
GLUCOSE: 107 mg/dL — AB (ref 65–99)
Potassium: 4.3 mmol/L (ref 3.5–5.1)
Sodium: 137 mmol/L (ref 135–145)

## 2015-07-26 LAB — GLUCOSE, CAPILLARY
GLUCOSE-CAPILLARY: 110 mg/dL — AB (ref 65–99)
Glucose-Capillary: 103 mg/dL — ABNORMAL HIGH (ref 65–99)

## 2015-07-26 LAB — CULTURE, BLOOD (SINGLE): Culture: NO GROWTH

## 2015-07-26 LAB — MAGNESIUM: Magnesium: 1.9 mg/dL (ref 1.7–2.4)

## 2015-07-26 NOTE — Progress Notes (Signed)
[facility]                                                                              Patient Demographics  Marissa [name], is a 74 y.o. female, DOB - [birth year], OZ:[id number]  Admit date - 07/13/2015   Admitting Physician Waldemar Dickens, MD  Outpatient Primary MD for the patient is [name], MD  LOS - 39   Chief Complaint  Patient presents with  . Emesis       Brief HPI    74 y.o. female, with chronic kidney disease, obstructive sleep apnea, diastolic heart failure, type 2 diabetes, multiple abdominal surgeries, and a recent discharge after care of an enterocutaneous fistula, presented to the emergency department from [facility] with lethargy, 2 days of vomiting and severe dehydration. On 04/28/15 the patient underwent abdominal wound exploration of enterocutaneous fistula and abscess by Dr. Georgette Dover. She was admitted to the hospital on 9/22 with increased ostomy output and confusion. She was treated with TPN and Sandostatin. An eakin pouch was placed. Once she stabilized she was discharged on 10/4 to SNF. Mar[facility] Kitchen Per the patient's daughter, Dr. Georgette Dover felt the patient could begin to take oral food & drink approximately 2 weeks ago. The patient's TPN was discontinued at some point after that. The patient ate fairly well for 4-5 days but then decreased her oral intake. One week back patient's enterocutaneous fistula began to drain green bile once again. Her stools became the color of green bile. Yesterday prior to admission patient had several episodes of biliary vomiting. In the emergency department the patient's bicarbonate was 12, her creatinine is elevated to 3.44 from 1.75, her BUN is 160, sodium 129. Lactic acid was normal. UA positive for UTI. Patient admitted to hospitalist service and general surgery following.  Assessment & Plan   Acute on chronic kidney injury with metabolic acidosis- prerenal secondary to dehydration. Has underlying chronic  kidney disease stage III.  -Now creatinine starting to trend up again continue D5 with half-normal saline. Renal consult if creatinine function continues to decline. Pharmacy managing vancomycin. - Discontinued indomethacin, colchicine. Avoid all nephrotoxic agents. - not a candidate for HD.   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 surgery following. - NPO, blood cultures from 11/7 and 11/18 remains negative - Eakin pounds placed to intermittent wall suction. Wound care following. - Continue Sandostatin. - Gen. surgery considering surgical options, possibly next week - Unfortunately PICC line had to be removed on 11/12 due to superficial thrombosis and dislodgment, TPN again on hold.  She will need long-term TPN, await further recommendations from surgery regarding tunneled access.   Coag negative staph bacteremia with sepsis 2/2 on admission - Placed on empiric IV vancomycin. Repeat blood culture from 11/3 still positive for coag-negative bacteremia. PICC line discontinued and repeat blood culture were sent.  - Dr Clementeen Graham  discussed with ID, Dr. Linus Salmons. Recommend 2 week course of IV vancomycin. ( stop date 11/20 from the clear blood cultures) - Repeat blood cultures 11/7 negative so far, PICC line placed on 11/10, had to be removed on 11/12 due  to dislodgment and superficial thrombosis.  Proteus UTI Pansensitive. completed 5 days of empiric Rocephin.  transaminitis  LFTs improving Abdominal ultrasound showed mild associated CBD dilatation, cholecystectomy, normal sonographic appearance of liver, no intrahepatic dilatation.  Acute encephalopathy appears to be back to baseline - Possible secondary to sepsis and dehydration. Appears improved.  Hyponatremia improving  Oral thrush Continue nystatin  Chronic diastolic CHF EF of [id number]. Currently hypovolemic and on gentle hydration.  Obstructive sleep apnea Continue bedtime  CPAP  Prediabetes monitor with SSI  Severe malnutrition with failure to thrive TPN again on hold due to lack of central access  OSA On CPAP.  Code Status:DO NOT RESUSCITATE  family Communication: Discussed in detail with the patient, all imaging results, lab results explained to the patient    Disposition Plan:   Time Spent in minutes  25 minutes  Procedures  CT abd   Consults Nephrology General surgery  ID  DVT Prophylaxis heparin subcutaneous   Medications  Scheduled Meds: . heparin  5,000 Units Subcutaneous 3 times per day  . loperamide  2 mg Oral 4 times per day  . nystatin  5 mL Oral QID  . octreotide  100 mcg Subcutaneous Q12H  . pantoprazole (PROTONIX) IV  40 mg Intravenous Q24H  . vancomycin  1,250 mg Intravenous Q48H   Continuous Infusions: . dextrose 5 % and 0.45% NaCl 100 mL/hr at 07/26/15 0911   PRN Meds:.diphenhydrAMINE, diphenhydrAMINE-zinc acetate, Glycerin (Adult), hydrocerin, hydrOXYzine, iohexol, morphine injection, nitroGLYCERIN, ondansetron **OR** ondansetron (ZOFRAN) IV, oxyCODONE, sodium chloride, sodium chloride   Antibiotics   Anti-infectives    Start     Dose/Rate Route Frequency Ordered Stop   07/23/15 1000  vancomycin (VANCOCIN) 1,250 mg in sodium chloride 0.9 % 250 mL IVPB     1,250 mg 166.7 mL/hr over 90 Minutes Intravenous Every 48 hours 07/23/15 0908     07/16/15 1000  vancomycin (VANCOCIN) 1,500 mg in sodium chloride 0.9 % 500 mL IVPB  Status:  Discontinued     1,500 mg 250 mL/hr over 120 Minutes Intravenous Every 48 hours 07/14/15 0956 07/15/15 1035   07/16/15 1000  vancomycin (VANCOCIN) IVPB 1000 mg/200 mL premix  Status:  Discontinued     1,000 mg 200 mL/hr over 60 Minutes Intravenous Every 48 hours 07/15/15 1035 07/20/15 1242   07/14/15 1500  cefTRIAXone (ROCEPHIN) 1 g in dextrose 5 % 50 mL IVPB  Status:  Discontinued     1 g 100 mL/hr over 30 Minutes Intravenous Every 24 hours 07/13/15 1647 07/17/15 1113   07/14/15  1015  vancomycin (VANCOCIN) 1,500 mg in sodium chloride 0.9 % 500 mL IVPB     1,500 mg 250 mL/hr over 120 Minutes Intravenous NOW 07/14/15 0943 07/14/15 1210   07/13/15 1400  cefTRIAXone (ROCEPHIN) 1 g in dextrose 5 % 50 mL IVPB     1 g 100 mL/hr over 30 Minutes Intravenous  Once 07/13/15 1359 07/13/15 1526        Subjective:   Marissa [name] was seen and examined today. Denies any specific complaints. No fevers. Patient denies dizziness, chest pain, shortness of breath, new weakness, numbess, tingling.  Objective:   Blood pressure 106/55, pulse 94, temperature 97.7 F (36.5 C), temperature source Oral, resp. rate 18, height 5\' 4"  (1.626 m), weight 102.6 kg (226 lb 3.1 oz), SpO2 100 %.  Wt Readings from Last 3 Encounters:  07/25/15 102.6 kg (226 lb 3.1 oz)  06/15/15 102.7 kg (226 lb 6.6 oz)  05/01/15 99.3 kg (  218 lb 14.7 oz)     Intake/Output Summary (Last 24 hours) at 07/26/15 1153 Last data filed at 07/26/15 0800  Gross per 24 hour  Intake   1378 ml  Output   1450 ml  Net    -72 ml    Exam  General: Alert and oriented, NAD  HEENT:  PERRLA, EOMI, Anicteric Sclera  Neck: Supple, no JVD, no masses  CVS: S1 S2 clear, RRR  Respiratory: CTAB  Abdomen: Soft, midline wound, eakin pouch to wall suction   Ext: no c/c/e. Left upper arm slight swelling although no tenderness or erythema    Neuro: no new deficits  Skin: No rashes  Psych: Normal affect and demeanor, alert    Data Review   Micro Results Recent Results (from the past 240 hour(s))  Culture, blood (routine x 2)     Status: None   Collection Time: 07/20/15  8:04 PM  Result Value Ref Range Status   Specimen Description BLOOD LEFT HAND  Final   Special Requests IN PEDIATRIC BOTTLE [place]  Final   Culture NO GROWTH 5 DAYS  Final   Report Status 07/25/2015 FINAL  Final  Culture, blood (single)     Status: None (Preliminary result)   Collection Time: 07/21/15  6:10 AM  Result Value Ref Range Status    Specimen Description BLOOD LEFT HAND  Final   Special Requests IN PEDIATRIC BOTTLE  1CC  Final   Culture NO GROWTH 4 DAYS  Final   Report Status PENDING  Incomplete    Radiology Reports Ct Abdomen Pelvis Wo Contrast  07/13/2015  CLINICAL DATA:  Subsequent encounter for nausea vomiting with low abdominal pain and increased drainage from fistula. EXAM: CT ABDOMEN AND PELVIS WITHOUT CONTRAST TECHNIQUE: Multidetector CT imaging of the abdomen and pelvis was performed following the standard protocol without IV contrast. COMPARISON:  06/04/2015. FINDINGS: Lower chest: Motion artifact with some atelectasis in the dependent bases. Hepatobiliary: No focal abnormality in the liver on this study without intravenous contrast. No evidence of hepatomegaly. Gallbladder is surgically absent. No intrahepatic or extrahepatic biliary dilation. Pancreas: No focal mass lesion. No dilatation of the main duct. No intraparenchymal cyst. No peripancreatic edema. Spleen: No splenomegaly. No focal mass lesion. Adrenals/Urinary Tract: No adrenal nodule or mass. No mass lesion evident in either kidney on this study without intravenous contrast material. No evidence for hydroureteronephrosis. The urinary bladder appears normal for the degree of distention. Stomach/Bowel: Small hiatal hernia noted. Metallic foreign body identified along the posterior wall of the gastric fundus. This is been present on multiple prior studies is well. Duodenum is normally positioned as is the ligament of Treitz. No small bowel wall thickening. No small bowel dilatation. The terminal ileum is normal. The appendix is not visualized, but there is no edema or inflammation in the region of the cecum. Diverticuli are seen scattered along the entire length of the colon without CT findings of diverticulitis. Small bowel adhesions to the anterior abdominal wall again noted. There is soft tissue attenuation in the midline anterior abdominal wall, at the site of  previous surgical wound. This is the area where enterocutaneous fistula was visualized on the previous study. No gas in this region today but no oral contrast was administered to allow assessment of the fistula. Vascular/Lymphatic: There is abdominal aortic atherosclerosis without aneurysm. There is no gastrohepatic or hepatoduodenal ligament lymphadenopathy. No intraperitoneal or retroperitoneal lymphadenopy. No pelvic sidewall lymphadenopathy. Reproductive: Uterus is surgically absent. There is no adnexal mass. Other:  No intraperitoneal free fluid. Musculoskeletal: Bone windows reveal no worrisome lytic or sclerotic osseous lesions. IMPRESSION: Stable exam. No new or acute interval findings. There is no gas in the region of the chronic enterocutaneous fistula no evidence to suggest interval development of an abscess in this region. Fistula not well assessed given the lack of oral contrast on today's study. No evidence for bowel obstruction. No intraperitoneal fluid collection and no intraperitoneal free fluid. Electronically Signed   By: Misty Stanley M.D.   On: 07/13/2015 13:27   Dg Chest 2 View  07/13/2015  CLINICAL DATA:  Emesis.  CHF. EXAM: CHEST  2 VIEW COMPARISON:  06/04/2015 chest radiograph FINDINGS: Right PICC terminates in the lower third of the superior vena cava. Stable cardiomediastinal silhouette with mild cardiomegaly. No pneumothorax. No pleural effusion. Clear lungs, with no focal lung consolidation and no pulmonary edema. IMPRESSION: Stable mild cardiomegaly without pulmonary edema. Lungs appear clear. Electronically Signed   By: Ilona Sorrel M.D.   On: 07/13/2015 12:43   US Abdomen Complete  07/21/2015  CLINICAL DATA:  Elevated liver function studies. EXAM: ULTRASOUND ABDOMEN COMPLETE COMPARISON:  Multiple prior ultrasound and CT examinations. FINDINGS: Gallbladder: Surgically absent Common bile duct: Diameter: 7.1 mm Liver: Normal echogenicity without focal lesion or biliary dilatation.  IVC: Normal caliber Pancreas: Poorly visualized but normal on recent CT scans. Spleen: Normal size.  No focal lesions. Right Kidney: Length: 10.4 cm. Mild renal cortical thinning but no mass or hydronephrosis. Left Kidney: Length: 10.3 cm. Mild renal cortical thinning but no mass or hydronephrosis. Abdominal aorta: Normal caliber Other findings: None. IMPRESSION: Status post cholecystectomy with mild associated common bile duct dilatation. Normal sonographic appearance of the liver. No intrahepatic biliary dilatation. Poor visualization of the pancreas but it was normal on recent CT scans. Electronically Signed   By: Marijo Sanes M.D.   On: 07/21/2015 23:43   US Renal  07/17/2015  CLINICAL DATA:  Renal insufficiency EXAM: RENAL ULTRASOUND COMPARISON:  Renal ultrasound June 08, 2015; CT abdomen and pelvis July 13, 2015 FINDINGS: Right Kidney: Length: 11.1 cm. Echogenicity and renal cortical thickness are within normal limits. No perinephric fluid or hydronephrosis visualized. There is a cyst in the upper pole region measuring 1.5 x 1.5 x 1.5 cm. No sonographically demonstrable calculus or ureterectasis. Left Kidney: Length: 10.8 cm. Echogenicity and renal cortical thickness are within normal limits. No mass, perinephric fluid, or hydronephrosis visualized. No sonographically demonstrable calculus. Bladder: Completely empty and cannot be assessed. IMPRESSION: Cyst arising from upper pole right kidney. Study otherwise unremarkable. Note the urinary bladder is empty and cannot be assessed. Electronically Signed   By: Lowella Grip III M.D.   On: 07/17/2015 11:45   Dg Chest Port 1 View  07/25/2015  CLINICAL DATA:  PICC line placement EXAM: PORTABLE CHEST 1 VIEW COMPARISON:  07/13/2015 FINDINGS: Left PICC line tip is at the innominate SVC junction. This could be advanced 5 cm to the SVC RA junction. Normal heart size and vascularity. Minor streaky left base atelectasis. Right lung remains clear. No  edema, effusion, or pneumothorax. Trachea midline. Atherosclerosis of the aorta. IMPRESSION: Left PICC line tip at the innominate SVC junction and can be advanced 5 cm to the SVC RA junction. Left base atelectasis Thoracic atherosclerosis Electronically Signed   By: Jerilynn Mages.  Shick M.D.   On: 07/25/2015 12:07   Dg Ugi W/small Bowel  07/20/2015  CLINICAL DATA:  74 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 Prior repair. Question level of fistula. Subsequent encounter.  EXAM: UPPER GI SERIES WITH SMALL BOWEL FOLLOW-THROUGH FLUOROSCOPY TIME:  Radiation Exposure Index (as provided by the fluoroscopic device): 1634.12 micro Gy TECHNIQUE: Water-soluble upper GI series followed by small bowel were obtained after discussion with Dr. Kieth Brightly. COMPARISON:  None. FINDINGS: Draining clips are noted on scout view. Patient was only able to ingest a small amount of Omnipaque 300 at one time. Total of 150 cc was ingested. The present examination was specifically tailored to evaluate for level of the enterocutaneous fistula. Gastroesophageal reflux noted. Patient was kept in the upright position to avoid aspiration. Scattered duodenal diverticulum. Nonspecific eggshell calcification right upper quadrant. Enterocutaneous fistulas best seen on lateral overhead imaging. This appears to arise from the proximal to mid ileum. Distal aspect of the small bowel not visualized. Patient was tired of the exam at this point. Study terminated. IMPRESSION: Enterocutaneous fistulas best seen on lateral overhead imaging. This appears to arise from the proximal to mid ileum. Exam otherwise limited as noted above. Electronically Signed   By: Genia Del M.D.   On: 07/20/2015 15:46    CBC  Recent Labs Lab 07/20/15 1020 07/23/15 0615  WBC 7.9 7.9  HGB 9.8* 8.3*  HCT 28.6* 27.7*  PLT 242 247  MCV 70.6* 80.5  MCH 24.2* 24.1*  MCHC 34.3 30.0  RDW 14.6 14.3  LYMPHSABS 1.0  --   MONOABS 0.9  --   EOSABS 0.5  --   BASOSABS 0.1  --      Chemistries   Recent Labs Lab 07/20/15 1020  07/22/15 0352 07/23/15 0615 07/24/15 0507 07/25/15 0449 07/26/15 0220  NA 145  < > 147* 141 142 137 137  K 2.9*  < > 3.4* 3.3* 3.3* 3.5 4.3  CL 98*  < > 99* 97* 103 101 103  CO2 33*  < > 34* 33* 33* 30 27  GLUCOSE 164*  < > 134* 127* 144* 145* 107*  BUN 138*  < > 76* 54* 42* 43* 48*  CREATININE 3.70*  < > 3.08* 2.92* 2.59* 2.61* 2.88*  CALCIUM 9.3  < > 8.5* 8.5* 8.5* 8.2* 8.2*  MG 2.1  --   --  1.6* 1.7 2.0 1.9  AST 99*  --  93* 70*  --   --   --   ALT 135*  --  161* 136*  --   --   --   ALKPHOS 174*  --  146* 137*  --   --   --   BILITOT 0.9  --  0.9 1.0  --   --   --   < > = values in this interval not displayed. ------------------------------------------------------------------------------------------------------------------ estimated creatinine clearance is 20 mL/min (by C-G formula based on Cr of 2.88). ------------------------------------------------------------------------------------------------------------------ No results for input(s): HGBA1C in the last 72 hours. ------------------------------------------------------------------------------------------------------------------ No results for input(s): CHOL, HDL, LDLCALC, TRIG, CHOLHDL, LDLDIRECT in the last 72 hours. ------------------------------------------------------------------------------------------------------------------ No results for input(s): TSH, T4TOTAL, T3FREE, THYROIDAB in the last 72 hours.  Invalid input(s): FREET3 ------------------------------------------------------------------------------------------------------------------ No results for input(s): VITAMINB12, FOLATE, FERRITIN, TIBC, IRON, RETICCTPCT in the last 72 hours.  Coagulation profile No results for input(s): INR, PROTIME in the last 168 hours.  No results for input(s): DDIMER in the last 72 hours.  Cardiac Enzymes No results for input(s): CKMB, TROPONINI, MYOGLOBIN in the last 168  hours.  Invalid input(s): CK ------------------------------------------------------------------------------------------------------------------ Invalid input(s): [place]  07/24/15 2005 07/25/15 0808 07/25/15 1246 07/25/15 1616 07/25/15 1957 07/26/15 0814  GLUCAP 164* 142* 122*  19 92 103*     Marissa [name] M.D. [facility] 07/26/2015, 11:53 AM  Pager: [phone] Between 7am to 7pm - call Pager - [phone]  After 7pm go to [email or web address] - password [email or web address]  Call night coverage person covering after 7pm

## 2015-07-26 NOTE — Progress Notes (Signed)
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   No Known Allergies  Patient Measurements: Height: 5\' 4"  (162.6 cm) Weight: 226 lb 3.1 oz (102.6 kg) IBW/kg (Calculated) : 54.7    Vital Signs: Temp: 97.7 F (36.5 C) (11/13 0425) Temp Source: Oral (11/13 0425) BP: 106/55 mmHg (11/13 0425) Pulse Rate: 94 (11/13 0425) Intake/Output from previous day: 11/12 0701 - 11/13 0700 In: 1378 [I.V.:320; IV Piggyback:500; TPN:558] Out: 1450 [Drains:1450] Intake/Output from this shift:    Labs: No results for input(s): WBC, HGB, HCT, PLT, APTT, INR in the last 72 hours.   Recent Labs  07/24/15 0507 07/25/15 0449 07/26/15 0220  NA 142 137 137  K 3.3* 3.5 4.3  CL 103 101 103  CO2 33* 30 27  GLUCOSE 144* 145* 107*  BUN 42* 43* 48*  CREATININE 2.59* 2.61* 2.88*  CALCIUM 8.5* 8.2* 8.2*  MG 1.7 2.0 1.9  PHOS 3.8  --   --    Estimated Creatinine Clearance: 20 mL/min (by C-G formula based on Cr of 2.88).    Recent Labs  07/25/15 1616 07/25/15 1957 07/26/15 0814  GLUCAP 94 92 103*   Insulin Requirements in the past 12 hours:  0 units SSI  Nutritional Goals: per RD note 11/10 1700-1900 kCal, 100-110 grams of protein per day  Current Nutrition:  NPO TPN stopped 11/12 due to VTE above and below PICC line and PICC removed Previously on Clinimix E 5/15 at 80 cc/hr + 20% IVFE at 10 ml/hr   Per discussions/notes from [facility], was receiving cycled Clinimix 5/15 alternated with E 5/15, 2013ml over 12 hours (2000-0800). Per RN from facility [facility], patient was receiving TPN during her entire stay, however patient's admission labs/dehydrated state as well as report from daughter make this questionable.  Assessment: Marissa [name] on chronic TPN for a fistula who was admitted from [facility] with lethargy, 2 days of vomiting, increased drainage from fistula and severe dehydration. Her EC output was  bilious and Eakin's pouch was removed at facility. CT scan negative for infection however blood cultures from 10/31 were 2/2 CoNS. TPN held for line holiday and PICC was removed. Repeat blood cultures have been clear - and a new PICC was placed 11/10 to resume TPN. PICC removed 11/12 due to VTE above and below PICC line. TPN stopped 11/12.  To get CCS or IR to placed tunneled central venous access.   Surgeries/Procedures: 04/28/15: wound exploration, explanation of abdominal mesh Multiple other abdominal surgeries  GI:  Eakin pouch replaced- with suction. Wound care following.. NPO since 11/4 for line holiday with TPN held for PICC line infection. New PICC line placed 11/10 and TPN resumed. TPN stopped 11/12 due to DVTs around PICC line.   ECF output/24h:1450 cc. Continues on loperamide, octreotide, PPI IV. Pre-albumin 35.1 (11/7) Endo: A1C this admission is 6.2 (up from 5.7 in July 2016) . On sensitive SSI Lytes:K 4.3,  Mg 1.9; , CoCa~9.7 It appears as if the patient's home TPN was alternating between lytes and no lytes  Renal: AoCKD III d/t dehydration - SCr 2.61>>2.88 UOP not accurately charted. On D5-1/2NS at 40 cc/hr.  Hepatobil: LFTs trending down, 70/136 << 93/161, T bili wnl, Alk phos 137 << 146, TG 84 (11/7) Pulm:  OSA.  RA. CPAP at night Cards: EF 55%; VSS- no meds Neuro: Pt more alert ID: Vanc for continued treatment of CoNS PICC line infection - s/p removal 11/4 and replaced  11/10. S/p CTX for UTI, on nystatin for thrush.  Vanc therapy planned for 2 weeks s/p first neg culture: stop date is 08/02/15 VR 15 mcg/ml on 11/10 down from 32 mcg/ml on 11/8 - calculated ke~0.0158, Vanc restarted on 11/10 AM  11/12 VR = 19.5 drawn 41. 5 hours after last dose. Vanc trough goal is 15-20 mcg/ml.  Afebrile, WBC wnl Best Practices Hep SQ TPN Access: PICC placed 11/10 for TPN removed 11/12 TPN start date: chronic TPN- was on at home prior to admission in Sept, and continued after discharge. resumed  11/1 at the hospital  Plan: - continue vancomycin 1250 mg IV q48h - TPN off 2nd PICC line pulled, will f/u access placement -dc TPN labs, DC SSI and CBGs - MD increased d5.45 NS from 40 to 100 ml/hr since TPN off  Eudelia Bunch, Pharm.D. [phone] 07/26/2015 9:03 AM

## 2015-07-26 NOTE — Procedures (Signed)
Pt does not wish to wear cpap , RT will continue to monitor. 

## 2015-07-26 NOTE — Progress Notes (Signed)
The patient refused all but one of her meds last night.  She thinks they are making her worse.  Patient was educated.  She also refused to take any medication for itching, except the cream.

## 2015-07-26 NOTE — Progress Notes (Signed)
General Surgery Note  LOS: 13 days  POD -     Assessment/Plan: 1.  Enterocutaneous fistula  Abdominal wound exploration/ explantation of infected abdominal mesh - 04/28/2015 - [place]  On Vancomycin  Has Eaken's pound around the fistula  No change over the weekend, except IV access issues  2.  Chronic kidney disease  Creat - 2.88 - 07/26/2015 3.  Malnutrition   (on hold until IV access resolved)  Last prealbumin - 35 - 07/20/2015 4.  OSA 5.  Anemia - chronic  Hgb - 8.3 - 07/23/2015  6.  DVT prophylaxis - SQ heparin 7.  Superficial thrombosis of left arm - so PICC removed yesterday  ? Appears that she will need central venous access - planned tunneled central venous access - will discuss with Dr. Grandville Silos (who is our surgeon of the week next week) or get IR   Principal Problem:   Acute on chronic renal failure ([place]) Active Problems:   OSA (obstructive sleep apnea)   Essential hypertension   Chronic kidney disease, stage 3   Chronic diastolic CHF (congestive heart failure) (HCC)   Enterocutaneous fistula   Type 2 diabetes mellitus without complication (HCC)   Hyponatremia   Dehydration   Metabolic acidosis   Urinary tract infection   Oral thrush   Subjective:  She itches.  But otherwise hasno specific complaint Objective:   Filed Vitals:   07/26/15 0425  BP: 106/55  Pulse: 94  Temp: 97.7 F (36.5 C)  Resp: 18     Intake/Output from previous day:  11/12 0701 - 11/13 0700 In: 1378 [I.V.:320; IV Piggyback:500; TPN:558] Out: 1450 [Drains:1450]  Intake/Output this shift:      Physical Exam:   General: Obese AA F who is alert and oriented.    HEENT: Normal. Pupils equal. .   Lungs: Clear   Abdomen: Soft   Wound: Eaken's pouch - 1,450 cc recorded last 24 hours.   Extremities - has IV in right arm   Lab Results:   No results for input(s): WBC, HGB, HCT, PLT in the last 72 hours.  BMET    Recent Labs  07/25/15 0449 07/26/15 0220  NA 137 137  K 3.5 4.3   CL 101 103  CO2 30 27  GLUCOSE 145* 107*  BUN 43* 48*  CREATININE 2.61* 2.88*  CALCIUM 8.2* 8.2*    PT/INR  No results for input(s): LABPROT, INR in the last 72 hours.  ABG  No results for input(s): PHART, HCO3 in the last 72 hours.  Invalid input(s): PCO2, PO2   Studies/Results:  Dg Chest Port 1 View  07/25/2015  CLINICAL DATA:  PICC line placement EXAM: PORTABLE CHEST 1 VIEW COMPARISON:  07/13/2015 FINDINGS: Left PICC line tip is at the innominate SVC junction. This could be advanced 5 cm to the SVC RA junction. Normal heart size and vascularity. Minor streaky left base atelectasis. Right lung remains clear. No edema, effusion, or pneumothorax. Trachea midline. Atherosclerosis of the aorta. IMPRESSION: Left PICC line tip at the innominate SVC junction and can be advanced 5 cm to the SVC RA junction. Left base atelectasis Thoracic atherosclerosis Electronically Signed   By: Jerilynn Mages.  Shick M.D.   On: 07/25/2015 12:07     Anti-infectives:   Anti-infectives    Start     Dose/Rate Route Frequency Ordered Stop   07/23/15 1000  vancomycin (VANCOCIN) 1,250 mg in sodium chloride 0.9 % 250 mL IVPB     1,250 mg 166.7 mL/hr over 90  Minutes Intravenous Every 48 hours 07/23/15 0908     07/16/15 1000  vancomycin (VANCOCIN) 1,500 mg in sodium chloride 0.9 % 500 mL IVPB  Status:  Discontinued     1,500 mg 250 mL/hr over 120 Minutes Intravenous Every 48 hours 07/14/15 0956 07/15/15 1035   07/16/15 1000  vancomycin (VANCOCIN) IVPB 1000 mg/200 mL premix  Status:  Discontinued     1,000 mg 200 mL/hr over 60 Minutes Intravenous Every 48 hours 07/15/15 1035 07/20/15 1242   07/14/15 1500  cefTRIAXone (ROCEPHIN) 1 g in dextrose 5 % 50 mL IVPB  Status:  Discontinued     1 g 100 mL/hr over 30 Minutes Intravenous Every 24 hours 07/13/15 1647 07/17/15 1113   07/14/15 1015  vancomycin (VANCOCIN) 1,500 mg in sodium chloride 0.9 % 500 mL IVPB     1,500 mg 250 mL/hr over 120 Minutes Intravenous NOW 07/14/15 0943  07/14/15 1210   07/13/15 1400  cefTRIAXone (ROCEPHIN) 1 g in dextrose 5 % 50 mL IVPB     1 g 100 mL/hr over 30 Minutes Intravenous  Once 07/13/15 1359 07/13/15 1526      Alphonsa Overall, MD, FACS Pager: [facility] Office: [phone] 07/26/2015

## 2015-07-27 ENCOUNTER — Inpatient Hospital Stay (HOSPITAL_COMMUNITY): Payer: Medicare Other

## 2015-07-27 DIAGNOSIS — N179 Acute kidney failure, unspecified: Secondary | ICD-10-CM | POA: Insufficient documentation

## 2015-07-27 MED ORDER — MIDAZOLAM HCL 2 MG/2ML IJ SOLN
INTRAMUSCULAR | Status: AC
  Filled 2015-07-27: qty 4

## 2015-07-27 MED ORDER — SODIUM CHLORIDE 0.9 % IV SOLN
INTRAVENOUS | Status: AC | PRN
  Administered 2015-07-27: 10 mL/h via INTRAVENOUS

## 2015-07-27 MED ORDER — LIDOCAINE HCL 1 % IJ SOLN
INTRAMUSCULAR | Status: AC
  Filled 2015-07-27: qty 20

## 2015-07-27 MED ORDER — CEFAZOLIN SODIUM-DEXTROSE 2-3 GM-% IV SOLR
INTRAVENOUS | Status: AC
  Filled 2015-07-27: qty 50

## 2015-07-27 MED ORDER — SODIUM CHLORIDE 0.9 % IV BOLUS (SEPSIS)
500.0000 mL | Freq: Once | INTRAVENOUS | Status: AC
  Administered 2015-07-27: 500 mL via INTRAVENOUS

## 2015-07-27 MED ORDER — FENTANYL CITRATE (PF) 100 MCG/2ML IJ SOLN
INTRAMUSCULAR | Status: AC
  Filled 2015-07-27: qty 4

## 2015-07-27 MED ORDER — FENTANYL CITRATE (PF) 100 MCG/2ML IJ SOLN
INTRAMUSCULAR | Status: AC | PRN
  Administered 2015-07-27: 25 ug via INTRAVENOUS

## 2015-07-27 MED ORDER — HEPARIN SODIUM (PORCINE) 5000 UNIT/ML IJ SOLN: 5000.0000 [IU] | Freq: Three times a day (TID) | INTRAMUSCULAR | Status: DC

## 2015-07-27 MED ORDER — MIDAZOLAM HCL 2 MG/2ML IJ SOLN
INTRAMUSCULAR | Status: AC | PRN
  Administered 2015-07-27: 1 mg via INTRAVENOUS

## 2015-07-27 NOTE — Sedation Documentation (Signed)
Patient is resting comfortably. 

## 2015-07-27 NOTE — Consult Note (Addendum)
WOC wound follow up Current pouch is leaking behind the barrier to lower abd area. Wound type: Full thickness wound to midline abd with fistula, has decreased in size since admission.  Wound bed: 100% red Drainage (amount, consistency, odor) Large amt green drainage in cannister Periwound: Intact skin surrounding wound is macerated and very moist and weeping despite drying and crusting attempts.  It is difficult to maintain a pouch seal related to constant moisture. Dressing procedure/placement/frequency: Applied skin prep to protect skin, then large Eakin pouch with red rubber catheter to low wall suction to control drainage and promote healing.  Remains with large amt green liquid drainage to wall suction. [place] team will continue to follow. Supplies at bedside and instructions provided for staff nurses if leakage occurs. Julien Girt MSN, RN, [place], [place], [place]

## 2015-07-27 NOTE — Sedation Documentation (Signed)
Patient denies pain and is resting comfortably.  

## 2015-07-27 NOTE — Progress Notes (Signed)
Patient has refused the use of CPAP for the night. RT. Will continue to monitor./

## 2015-07-27 NOTE — Progress Notes (Addendum)
[facility]                                                                              Patient Demographics  Marissa [name], is a 74 y.o. female, DOB - 05-Feb-1941, [id number]  Admit date - 07/13/2015   Admitting Physician Waldemar Dickens, MD  Outpatient Primary MD for the patient is [name], MD  LOS - 14   Chief Complaint  Patient presents with  . Emesis       Brief HPI    74 y.o. female, with chronic kidney disease, obstructive sleep apnea, diastolic heart failure, type 2 diabetes, multiple abdominal surgeries, and a recent discharge after care of an enterocutaneous fistula, presented to the emergency department from [facility] with lethargy, 2 days of vomiting and severe dehydration. On 04/28/15 the patient underwent abdominal wound exploration of enterocutaneous fistula and abscess by Dr. Georgette Dover. She was admitted to the hospital on 9/22 with increased ostomy output and confusion. She was treated with TPN and Sandostatin. An eakin pouch was placed. Once she stabilized she was discharged on 10/4 to SNF. Per the patient's daughter, Dr. Georgette Dover felt the patient could begin to take oral food & drink approximately 2 weeks ago. The patient's TPN was discontinued at some point after that. The patient ate fairly well for 4-5 days but then decreased her oral intake. One week back patient's enterocutaneous fistula began to drain green bile once again. Her stools became the color of green bile. A day prior to admission, patient had several episodes of biliary vomiting. In ED, bicarb was 12, Cr 3.44 from 1.75, BUN 160, sodium 129. Lactic acid was normal. UA positive for UTI. Patient was admitted to hospitalist service and general surgery following.  Gen. surgery has been following the patient closely. Eakin pouch has been placed to intermittent wall suction. Wound care following. Patient had multiple blood cultures positive for coagulase negative staph, prior  PICC line was discontinued. Repeat blood cultures from 11/7 were negative, PICC line was placed but then had to be removed on 11/12 due to dislodgment and superficial thrombosis. Currently awaiting tunneled cath/port by interventional radiology. Infectious disease recommends 2 week course of IV vancomycin stop date 11/20  Assessment & Plan   Acute on chronic kidney injury with metabolic acidosis- prerenal secondary to dehydration. Has underlying chronic kidney disease stage III. Creatinine 3.4 at the time of admission, currently 2.8 -Now creatinine starting to trend up again continue D5 with half-normal saline. Pharmacy managing vancomycin. - Discontinued indomethacin, colchicine. Avoid all nephrotoxic agents. - not a candidate for HD. - follow BMET  Active problems Chronic enterocutaneous fistula status post Eakin pouch with difficult seal - Gen. surgery following. - NPO, blood cultures from 11/7 and 11/18 remains negative - Eakin pouch placed to intermittent wall suction. Wound care following. - Continue Sandostatin. - Gen. surgery considering surgical options, possibly next week - Unfortunately PICC line had to be removed on 11/12 due to superficial thrombosis and dislodgment, TPN again on hold.  -  She will need long-term TPN, tunnel cath/port to be placed by IR  Coag negative staph bacteremia with sepsis 2/2  on admission - Placed on empiric IV vancomycin. Repeat blood culture from 11/3 still positive for coag-negative bacteremia. PICC line discontinued and repeat blood culture were sent.  - Dr Clementeen Graham  discussed with ID, Dr. Linus Salmons. Recommend 2 week course of IV vancomycin. ( stop date 11/20 from the clear blood cultures) - Repeat blood cultures 11/7 negative so far, PICC line placed on 11/10, had to be removed on 11/12 due to dislodgment and superficial thrombosis. Now planned for tunneled central line catheter by IR.   Proteus UTI Pansensitive, completed 5 days of empiric  Rocephin.  Transaminitis  LFTs improving Abdominal ultrasound showed mild associated CBD dilatation, cholecystectomy, normal sonographic appearance of liver, no intrahepatic dilatation.  Acute encephalopathy: appears to be back to baseline - Possible secondary to sepsis and dehydration. Appears improved.  Hyponatremia improving  Oral thrush Continue nystatin  Chronic diastolic CHF EF of [id number]. Currently hypovolemic and on gentle hydration.  Obstructive sleep apnea Continue bedtime CPAP  Prediabetes monitor with SSI  Severe malnutrition with failure to thrive TPN again on hold due to lack of central access  OSA On CPAP.  Code Status:DO NOT RESUSCITATE  family Communication: Discussed in detail with the patient, all imaging results, lab results explained to the patient and patient's daughter, [name]   Disposition Plan:   Time Spent in minutes  25 minutes  Procedures  CT abd   Consults Nephrology General surgery  ID  DVT Prophylaxis heparin subcutaneous   Medications  Scheduled Meds: . loperamide  2 mg Oral 4 times per day  . nystatin  5 mL Oral QID  . octreotide  100 mcg Subcutaneous Q12H  . pantoprazole (PROTONIX) IV  40 mg Intravenous Q24H  . sodium chloride  500 mL Intravenous Once  . vancomycin  1,250 mg Intravenous Q48H   Continuous Infusions: . dextrose 5 % and 0.45% NaCl 100 mL/hr at 07/27/15 0544   PRN Meds:.diphenhydrAMINE, diphenhydrAMINE-zinc acetate, Glycerin (Adult), hydrocerin, hydrOXYzine, iohexol, morphine injection, nitroGLYCERIN, ondansetron **OR** ondansetron (ZOFRAN) IV, oxyCODONE, sodium chloride, sodium chloride   Antibiotics   Anti-infectives    Start     Dose/Rate Route Frequency Ordered Stop   07/23/15 1000  vancomycin (VANCOCIN) 1,250 mg in sodium chloride 0.9 % 250 mL IVPB     1,250 mg 166.7 mL/hr over 90 Minutes Intravenous Every 48 hours 07/23/15 0908     07/16/15 1000  vancomycin (VANCOCIN) 1,500 mg in sodium chloride  0.9 % 500 mL IVPB  Status:  Discontinued     1,500 mg 250 mL/hr over 120 Minutes Intravenous Every 48 hours 07/14/15 0956 07/15/15 1035   07/16/15 1000  vancomycin (VANCOCIN) IVPB 1000 mg/200 mL premix  Status:  Discontinued     1,000 mg 200 mL/hr over 60 Minutes Intravenous Every 48 hours 07/15/15 1035 07/20/15 1242   07/14/15 1500  cefTRIAXone (ROCEPHIN) 1 g in dextrose 5 % 50 mL IVPB  Status:  Discontinued     1 g 100 mL/hr over 30 Minutes Intravenous Every 24 hours 07/13/15 1647 07/17/15 1113   07/14/15 1015  vancomycin (VANCOCIN) 1,500 mg in sodium chloride 0.9 % 500 mL IVPB     1,500 mg 250 mL/hr over 120 Minutes Intravenous NOW 07/14/15 0943 07/14/15 1210   07/13/15 1400  cefTRIAXone (ROCEPHIN) 1 g in dextrose 5 % 50 mL IVPB     1 g 100 mL/hr over 30 Minutes Intravenous  Once 07/13/15 1359 07/13/15 1526        Subjective:   Jovita Gamma was  seen and examined today. Denies any specific complaints. No fevers. Patient denies dizziness, chest pain, shortness of breath, new weakness, numbess, tingling.  Objective:   Blood pressure 112/64, pulse 102, temperature 97.8 F (36.6 C), temperature source Oral, resp. rate 18, height 5\' 4"  (1.626 m), weight 106.142 kg (234 lb), SpO2 100 %.  Wt Readings from Last 3 Encounters:  07/27/15 106.142 kg (234 lb)  06/15/15 102.7 kg (226 lb 6.6 oz)  05/01/15 99.3 kg (218 lb 14.7 oz)     Intake/Output Summary (Last 24 hours) at 07/27/15 1229 Last data filed at 07/27/15 0509  Gross per 24 hour  Intake   1600 ml  Output    550 ml  Net   1050 ml    Exam  General: Alert and oriented, NAD  HEENT:  PERRLA, EOMI, Anicteric Sclera  Neck: Supple, no JVD, no masses  CVS: S1 S2 clear, RRR  Respiratory: CTAB  Abdomen: Soft, midline wound, eakin pouch to wall suction   Ext: no c/c/e. Left upper arm slight swelling, no erythema or tenderness  Neuro: no new deficits  Skin: No rashes  Psych: Normal affect and demeanor, alert     Data Review   Micro Results Recent Results (from the past 240 hour(s))  Culture, blood (routine x 2)     Status: None   Collection Time: 07/20/15  8:04 PM  Result Value Ref Range Status   Specimen Description BLOOD LEFT HAND  Final   Special Requests IN PEDIATRIC BOTTLE [place]  Final   Culture NO GROWTH 5 DAYS  Final   Report Status 07/25/2015 FINAL  Final  Culture, blood (single)     Status: None   Collection Time: 07/21/15  6:10 AM  Result Value Ref Range Status   Specimen Description BLOOD LEFT HAND  Final   Special Requests IN PEDIATRIC BOTTLE  [place]  Final   Culture NO GROWTH 5 DAYS  Final   Report Status 07/26/2015 FINAL  Final    Radiology Reports Ct Abdomen Pelvis Wo Contrast  07/13/2015  CLINICAL DATA:  Subsequent encounter for nausea vomiting with low abdominal pain and increased drainage from fistula. EXAM: CT ABDOMEN AND PELVIS WITHOUT CONTRAST TECHNIQUE: Multidetector CT imaging of the abdomen and pelvis was performed following the standard protocol without IV contrast. COMPARISON:  06/04/2015. FINDINGS: Lower chest: Motion artifact with some atelectasis in the dependent bases. Hepatobiliary: No focal abnormality in the liver on this study without intravenous contrast. No evidence of hepatomegaly. Gallbladder is surgically absent. No intrahepatic or extrahepatic biliary dilation. Pancreas: No focal mass lesion. No dilatation of the main duct. No intraparenchymal cyst. No peripancreatic edema. Spleen: No splenomegaly. No focal mass lesion. Adrenals/Urinary Tract: No adrenal nodule or mass. No mass lesion evident in either kidney on this study without intravenous contrast material. No evidence for hydroureteronephrosis. The urinary bladder appears normal for the degree of distention. Stomach/Bowel: Small hiatal hernia noted. Metallic foreign body identified along the posterior wall of the gastric fundus. This is been present on multiple prior studies is well. Duodenum is normally  positioned as is the ligament of Treitz. No small bowel wall thickening. No small bowel dilatation. The terminal ileum is normal. The appendix is not visualized, but there is no edema or inflammation in the region of the cecum. Diverticuli are seen scattered along the entire length of the colon without CT findings of diverticulitis. Small bowel adhesions to the anterior abdominal wall again noted. There is soft tissue attenuation in the midline anterior  abdominal wall, at the site of previous surgical wound. This is the area where enterocutaneous fistula was visualized on the previous study. No gas in this region today but no oral contrast was administered to allow assessment of the fistula. Vascular/Lymphatic: There is abdominal aortic atherosclerosis without aneurysm. There is no gastrohepatic or hepatoduodenal ligament lymphadenopathy. No intraperitoneal or retroperitoneal lymphadenopy. No pelvic sidewall lymphadenopathy. Reproductive: Uterus is surgically absent. There is no adnexal mass. Other: No intraperitoneal free fluid. Musculoskeletal: Bone windows reveal no worrisome lytic or sclerotic osseous lesions. IMPRESSION: Stable exam. No new or acute interval findings. There is no gas in the region of the chronic enterocutaneous fistula no evidence to suggest interval development of an abscess in this region. Fistula not well assessed given the lack of oral contrast on today's study. No evidence for bowel obstruction. No intraperitoneal fluid collection and no intraperitoneal free fluid. Electronically Signed   By: Misty Stanley M.D.   On: 07/13/2015 13:27   Dg Chest 2 View  07/13/2015  CLINICAL DATA:  Emesis.  CHF. EXAM: CHEST  2 VIEW COMPARISON:  06/04/2015 chest radiograph FINDINGS: Right PICC terminates in the lower third of the superior vena cava. Stable cardiomediastinal silhouette with mild cardiomegaly. No pneumothorax. No pleural effusion. Clear lungs, with no focal lung consolidation and no  pulmonary edema. IMPRESSION: Stable mild cardiomegaly without pulmonary edema. Lungs appear clear. Electronically Signed   By: Ilona Sorrel M.D.   On: 07/13/2015 12:43   US Abdomen Complete  07/21/2015  CLINICAL DATA:  Elevated liver function studies. EXAM: ULTRASOUND ABDOMEN COMPLETE COMPARISON:  Multiple prior ultrasound and CT examinations. FINDINGS: Gallbladder: Surgically absent Common bile duct: Diameter: 7.1 mm Liver: Normal echogenicity without focal lesion or biliary dilatation. IVC: Normal caliber Pancreas: Poorly visualized but normal on recent CT scans. Spleen: Normal size.  No focal lesions. Right Kidney: Length: 10.4 cm. Mild renal cortical thinning but no mass or hydronephrosis. Left Kidney: Length: 10.3 cm. Mild renal cortical thinning but no mass or hydronephrosis. Abdominal aorta: Normal caliber Other findings: None. IMPRESSION: Status post cholecystectomy with mild associated common bile duct dilatation. Normal sonographic appearance of the liver. No intrahepatic biliary dilatation. Poor visualization of the pancreas but it was normal on recent CT scans. Electronically Signed   By: Marijo Sanes M.D.   On: 07/21/2015 23:43   US Renal  07/17/2015  CLINICAL DATA:  Renal insufficiency EXAM: RENAL ULTRASOUND COMPARISON:  Renal ultrasound June 08, 2015; CT abdomen and pelvis July 13, 2015 FINDINGS: Right Kidney: Length: 11.1 cm. Echogenicity and renal cortical thickness are within normal limits. No perinephric fluid or hydronephrosis visualized. There is a cyst in the upper pole region measuring 1.5 x 1.5 x 1.5 cm. No sonographically demonstrable calculus or ureterectasis. Left Kidney: Length: 10.8 cm. Echogenicity and renal cortical thickness are within normal limits. No mass, perinephric fluid, or hydronephrosis visualized. No sonographically demonstrable calculus. Bladder: Completely empty and cannot be assessed. IMPRESSION: Cyst arising from upper pole right kidney. Study otherwise  unremarkable. Note the urinary bladder is empty and cannot be assessed. Electronically Signed   By: Lowella Grip III M.D.   On: 07/17/2015 11:45   Dg Chest Port 1 View  07/25/2015  CLINICAL DATA:  PICC line placement EXAM: PORTABLE CHEST 1 VIEW COMPARISON:  07/13/2015 FINDINGS: Left PICC line tip is at the innominate SVC junction. This could be advanced 5 cm to the SVC RA junction. Normal heart size and vascularity. Minor streaky left base atelectasis. Right lung remains clear. No  edema, effusion, or pneumothorax. Trachea midline. Atherosclerosis of the aorta. IMPRESSION: Left PICC line tip at the innominate SVC junction and can be advanced 5 cm to the SVC RA junction. Left base atelectasis Thoracic atherosclerosis Electronically Signed   By: Jerilynn Mages.  Shick M.D.   On: 07/25/2015 12:07   Dg Ugi W/small Bowel  07/20/2015  CLINICAL DATA:  74 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 Prior repair. Question level of fistula. Subsequent encounter. EXAM: UPPER GI SERIES WITH SMALL BOWEL FOLLOW-THROUGH FLUOROSCOPY TIME:  Radiation Exposure Index (as provided by the fluoroscopic device): 1634.12 micro Gy TECHNIQUE: Water-soluble upper GI series followed by small bowel were obtained after discussion with Dr. Kieth Brightly. COMPARISON:  None. FINDINGS: Draining clips are noted on scout view. Patient was only able to ingest a small amount of Omnipaque 300 at one time. Total of 150 cc was ingested. The present examination was specifically tailored to evaluate for level of the enterocutaneous fistula. Gastroesophageal reflux noted. Patient was kept in the upright position to avoid aspiration. Scattered duodenal diverticulum. Nonspecific eggshell calcification right upper quadrant. Enterocutaneous fistulas best seen on lateral overhead imaging. This appears to arise from the proximal to mid ileum. Distal aspect of the small bowel not visualized. Patient was tired of the exam at this point. Study terminated.  IMPRESSION: Enterocutaneous fistulas best seen on lateral overhead imaging. This appears to arise from the proximal to mid ileum. Exam otherwise limited as noted above. Electronically Signed   By: Genia Del M.D.   On: 07/20/2015 15:46    CBC  Recent Labs Lab 07/23/15 0615  WBC 7.9  HGB 8.3*  HCT 27.7*  PLT 247  MCV 80.5  MCH 24.1*  MCHC 30.0  RDW 14.3    Chemistries   Recent Labs Lab 07/22/15 0352 07/23/15 0615 07/24/15 0507 07/25/15 0449 07/26/15 0220  NA 147* 141 142 137 137  K 3.4* 3.3* 3.3* 3.5 4.3  CL 99* 97* 103 101 103  CO2 34* 33* 33* 30 27  GLUCOSE 134* 127* 144* 145* 107*  BUN 76* 54* 42* 43* 48*  CREATININE 3.08* 2.92* 2.59* 2.61* 2.88*  CALCIUM 8.5* 8.5* 8.5* 8.2* 8.2*  MG  --  1.6* 1.7 2.0 1.9  AST 93* 70*  --   --   --   ALT 161* 136*  --   --   --   ALKPHOS 146* 137*  --   --   --   BILITOT 0.9 1.0  --   --   --    ------------------------------------------------------------------------------------------------------------------ estimated creatinine clearance is 20.4 mL/min (by C-G formula based on Cr of 2.88). ------------------------------------------------------------------------------------------------------------------ No results for input(s): HGBA1C in the last 72 hours. ------------------------------------------------------------------------------------------------------------------ No results for input(s): CHOL, HDL, LDLCALC, TRIG, CHOLHDL, LDLDIRECT in the last 72 hours. ------------------------------------------------------------------------------------------------------------------ No results for input(s): TSH, T4TOTAL, T3FREE, THYROIDAB in the last 72 hours.  Invalid input(s): FREET3 ------------------------------------------------------------------------------------------------------------------ No results for input(s): VITAMINB12, FOLATE, FERRITIN, TIBC, IRON, RETICCTPCT in the last 72 hours.  Coagulation profile No results for  input(s): INR, PROTIME in the last 168 hours.  No results for input(s): DDIMER in the last 72 hours.  Cardiac Enzymes No results for input(s): CKMB, TROPONINI, MYOGLOBIN in the last 168 hours.  Invalid input(s): CK ------------------------------------------------------------------------------------------------------------------ Invalid input(s): [place]  07/25/15 0808 07/25/15 1246 07/25/15 1616 07/25/15 1957 07/26/15 0814 07/26/15 1225  GLUCAP 142* 122* 97 92 103* 110*     RAI,RIPUDEEP M.D. [facility] 07/27/2015, 12:29 PM  Pager: [phone] Between 7am to 7pm -  call Pager - [phone]  After 7pm go to [email or web address] - password [email or web address]  Call night coverage person covering after 7pm

## 2015-07-27 NOTE — Progress Notes (Signed)
Patient ID: Marissa [name], female   DOB: Oct 28, 1940, 74 y.o.   MRN: [id number]     [address] [place], [place] [address]    Phone: [phone] FAX: [phone]     Subjective: PICC removed and TPN stopped. On IVF.  Denies pain, n/v. 500cc bilious output.    Objective:  Vital signs:  Filed Vitals:   07/26/15 0425 07/26/15 1409 07/26/15 2147 07/27/15 0608  BP: 106/55 114/66 120/70 106/53  Pulse: 94 92 99 85  Temp: 97.7 F (36.5 C) 98.1 F (36.7 C) 98.2 F (36.8 C) 97.8 F (36.6 C)  TempSrc: Oral Oral Oral Oral  Resp: _0 Height:      Weight:    106.142 kg (234 lb)  SpO2: 100% 100% 100% 100%    Last BM Date: 07/21/15  Intake/Output   Yesterday:  11/13 0701 - 11/14 0700 In: 1600 [I.V.:1600] Out: 550 [Drains:550] This shift: I/O last 3 completed shifts: In: 1600 [I.V.:1600] Out: 1000 [Drains:1000]    Physical Exam: General: Pt awake/alert/oriented x4 in no acute distress  Abdomen: Soft.  Nondistended. eakin's pouch in place with red rubber catheter to suction.  500cc out yesterday.  Non tender.    Problem List:   Principal Problem:   Acute on chronic renal failure (HCC) Active Problems:   OSA (obstructive sleep apnea)   Essential hypertension   Chronic kidney disease, stage 3   Chronic diastolic CHF (congestive heart failure) (HCC)   Enterocutaneous fistula   Type 2 diabetes mellitus without complication (HCC)   Hyponatremia   Dehydration   Metabolic acidosis   Urinary tract infection   Oral thrush    Results:   Labs: Results for orders placed or performed during the hospital encounter of 07/13/15 (from the past 48 hour(s))  Glucose, capillary     Status: Abnormal   Collection Time: 07/25/15 12:46 PM  Result Value Ref Range   Glucose-Capillary 122 (H) 65 - 99 mg/dL  Glucose, capillary     Status: None   Collection Time: 07/25/15  4:16 PM  Result Value Ref Range    Glucose-Capillary 94 65 - 99 mg/dL  Glucose, capillary     Status: None   Collection Time: 07/25/15  7:57 PM  Result Value Ref Range   Glucose-Capillary 92 65 - 99 mg/dL  Basic metabolic panel     Status: Abnormal   Collection Time: 07/26/15  2:20 AM  Result Value Ref Range   Sodium 137 135 - 145 mmol/L   Potassium 4.3 3.5 - 5.1 mmol/L    Comment: DELTA CHECK NOTED   Chloride 103 101 - 111 mmol/L   CO2 27 22 - 32 mmol/L   Glucose, Bld 107 (H) 65 - 99 mg/dL   BUN 48 (H) 6 - 20 mg/dL   Creatinine, Ser 2.88 (H) 0.44 - 1.00 mg/dL   Calcium 8.2 (L) 8.9 - 10.3 mg/dL   GFR calc non Af Amer 15 (L) >60 mL/min   GFR calc Af Amer 17 (L) >60 mL/min    Comment: (NOTE) The eGFR has been calculated using the CKD EPI equation. This calculation has not been validated in all clinical situations. eGFR's persistently <60 mL/min signify possible Chronic Kidney Disease.    Anion gap 7 5 - 15  Magnesium     Status: None   Collection Time: 07/26/15  2:20 AM  Result Value Ref Range   Magnesium  1.9 1.7 - 2.4 mg/dL  Glucose, capillary     Status: Abnormal   Collection Time: 07/26/15  8:14 AM  Result Value Ref Range   Glucose-Capillary 103 (H) 65 - 99 mg/dL  Glucose, capillary     Status: Abnormal   Collection Time: 07/26/15 12:25 PM  Result Value Ref Range   Glucose-Capillary 110 (H) 65 - 99 mg/dL    Imaging / Studies: Dg Chest Port 1 View  07/25/2015  CLINICAL DATA:  PICC line placement EXAM: PORTABLE CHEST 1 VIEW COMPARISON:  07/13/2015 FINDINGS: Left PICC line tip is at the innominate SVC junction. This could be advanced 5 cm to the SVC RA junction. Normal heart size and vascularity. Minor streaky left base atelectasis. Right lung remains clear. No edema, effusion, or pneumothorax. Trachea midline. Atherosclerosis of the aorta. IMPRESSION: Left PICC line tip at the innominate SVC junction and can be advanced 5 cm to the SVC RA junction. Left base atelectasis Thoracic atherosclerosis Electronically  Signed   By: Jerilynn Mages.  Shick M.D.   On: 07/25/2015 12:07    Medications / Allergies:  Scheduled Meds: . heparin  5,000 Units Subcutaneous 3 times per day  . loperamide  2 mg Oral 4 times per day  . nystatin  5 mL Oral QID  . octreotide  100 mcg Subcutaneous Q12H  . pantoprazole (PROTONIX) IV  40 mg Intravenous Q24H  . vancomycin  1,250 mg Intravenous Q48H   Continuous Infusions: . dextrose 5 % and 0.45% NaCl 100 mL/hr at 07/27/15 0544   PRN Meds:.diphenhydrAMINE, diphenhydrAMINE-zinc acetate, Glycerin (Adult), hydrocerin, hydrOXYzine, iohexol, morphine injection, nitroGLYCERIN, ondansetron **OR** ondansetron (ZOFRAN) IV, oxyCODONE, sodium chloride, sodium chloride  Antibiotics: Anti-infectives    Start     Dose/Rate Route Frequency Ordered Stop   07/23/15 1000  vancomycin (VANCOCIN) 1,250 mg in sodium chloride 0.9 % 250 mL IVPB     1,250 mg 166.7 mL/hr over 90 Minutes Intravenous Every 48 hours 07/23/15 0908     07/16/15 1000  vancomycin (VANCOCIN) 1,500 mg in sodium chloride 0.9 % 500 mL IVPB  Status:  Discontinued     1,500 mg 250 mL/hr over 120 Minutes Intravenous Every 48 hours 07/14/15 0956 07/15/15 1035   07/16/15 1000  vancomycin (VANCOCIN) IVPB 1000 mg/200 mL premix  Status:  Discontinued     1,000 mg 200 mL/hr over 60 Minutes Intravenous Every 48 hours 07/15/15 1035 07/20/15 1242   07/14/15 1500  cefTRIAXone (ROCEPHIN) 1 g in dextrose 5 % 50 mL IVPB  Status:  Discontinued     1 g 100 mL/hr over 30 Minutes Intravenous Every 24 hours 07/13/15 1647 07/17/15 1113   07/14/15 1015  vancomycin (VANCOCIN) 1,500 mg in sodium chloride 0.9 % 500 mL IVPB     1,500 mg 250 mL/hr over 120 Minutes Intravenous NOW 07/14/15 0943 07/14/15 1210   07/13/15 1400  cefTRIAXone (ROCEPHIN) 1 g in dextrose 5 % 50 mL IVPB     1 g 100 mL/hr over 30 Minutes Intravenous  Once 07/13/15 1359 07/13/15 1526      Assessment/Plan ECF-eakin's pouch, appreciate WOC management. -accurate  I&Os -sandostatin/loperamide to slow down transit -cont strict NPO, output has decreased.  570m out, give 500cc bolus  -SBFT did show EC fistula to be fairly distal in the proximal to mid ileum -we need to get her back on TPN and conditioned.  Recheck pre-albumin in AM.  PICC removed d/t superficial thrombus and IVT unable to place in opposite arm.  Will ask IR  to place a tunneled catheter.   ID-BCs with GPC. repeat Cx 12/7 are negative to date.antibiotic stop date 11/20 per ID FEN-NPO.  PCM- prealbumin 35.1(11/7). AM prealbumin  Acute on chronic renal failure- per medicine and renal, sCr 2.8(11/13) VTE prophylaxis-SCD/heparin    Erby Pian, [facility] Surgery Pager [phone](7A-4:30P) For consults and floor pages call [phone](7A-4:30P)  07/27/2015 8:39 AM

## 2015-07-27 NOTE — Progress Notes (Signed)
Physical Therapy Treatment Patient Details Name: Marissa [name] MRN: [id number] DOB: [birth year] Today's Date: 07/27/2015    History of Present Illness 74 y.o. female presenting with emesis. Pt was admitted on 9/22 with AMS, abdominal wounds and pain, dehydration and weakness. PMH: Chronic kidney disease, obstructive sleep apnea, diastolic heart failure, DMII, HTN and Hidradenitis suppurativa.    PT Comments    Pt reluctant to participate in therapy in beginning of treatment, however, did better than last PT session. Pt was able to perform 2 sit to stand trials requiring mod assistance for safety and balance, nurses performed a skin check and applied lotion to pts back side. First standing trial ~30 seconds, second trial ~1 minute. Pt performed general BLE exercises requiring no assistance or noticeable increase in pain.   Follow Up Recommendations  SNF     Equipment Recommendations  None recommended by PT       Precautions / Restrictions Restrictions Weight Bearing Restrictions: No    Mobility                    Transfers Overall transfer level: Needs assistance Equipment used: Rolling walker (2 wheeled) Transfers: Sit to/from Stand Sit to Stand: Mod assist;+2 physical assistance         General transfer comment: Pt able to stand with mod A +2 for safety and balance. Pt stated she felt pain while therapist was blocking left knee while standing up.                                          07/27/15 1207  Balance  Overall balance assessment Needs assistance  Sitting-balance support Feet supported (used BUE to get to sitting. Back supported once sitting)  Sitting balance-Leahy Scale Fair  Standing balance support Bilateral upper extremity supported  Standing balance-Leahy Scale Poor  Standing balance comment pt able to stand while holding on to RW and +2 assistance for safety     Cognition Arousal/Alertness: Awake/alert Behavior During  Therapy: Anxious                        Exercises General Exercises - Lower Extremity Ankle Circles/Pumps: AROM;Both;20 reps;Seated Long Arc Quad: AROM;Both;10 reps;Seated (hold for 5 seconds) Hip Flexion/Marching: AROM;Both;10 reps;Standing Toe Raises: AROM;Both;10 reps;Seated Heel Raises: AROM;Both;10 reps;Seated        Pertinent Vitals/Pain Pain Assessment: 0-10 Pain Score: 6  Pain Location: stomach Pain Intervention(s): Monitored during session           PT Goals (current goals can now be found in the care plan section) Progress towards PT goals: Progressing toward goals    Frequency  Min 2X/week    PT Plan Current plan remains appropriate       End of Session   Activity Tolerance: Patient limited by fatigue;Patient limited by pain Patient left: in chair;with call bell/phone within reach     Time: 1134-1205 PT Time Calculation (min) (ACUTE ONLY): 31 min  Charges:    1 TA [place], [place] OFFICE  07/27/2015, 12:17 PM

## 2015-07-27 NOTE — Procedures (Signed)
Placement of tunneled right jugular central line.  Tip at [place] and ready to use.  Minimal blood loss and no immediate complication.

## 2015-07-27 NOTE — Progress Notes (Signed)
Referring Physician(s): CCS  Chief Complaint:  Enterocutaneous fistula post  Infected abdominal mesh surgery 04/28/2015   Subjective:  Pt NPO for now Using TPN per PICC Left Newly discovered superficial thrombus of Left upper arm PICC removed IV team unable to place Rt arm PICC- technically difficult CCM wants tunneled central catheter for continued long term TPN Also known chronic renal failure stage 3 Now scheduled for same   Allergies: Review of patient's allergies indicates no known allergies.  Medications: Prior to Admission medications   Medication Sig Start Date End Date Taking? Authorizing Provider  acetaminophen (TYLENOL) 325 MG tablet Take 650 mg by mouth every 6 (six) hours as needed (general discomfort).    Yes Historical Provider, MD  colchicine 0.6 MG tablet Take 0.6 mg by mouth daily.   Yes Historical Provider, MD  cyanocobalamin 2000 MCG tablet Take 1 tablet (2,000 mcg total) by mouth daily. 01/07/15  Yes Janith Lima, MD  diphenhydrAMINE (BENADRYL) 25 mg capsule Take 25 mg by mouth every 4 (four) hours as needed for itching.   Yes Historical Provider, MD  FeAsp-B12-FA-C-DSS-SuccAc-Zn (FERIVA 21/7) 75-1 MG TABS Take 1 tablet by mouth daily. 04/15/15  Yes Janith Lima, MD  indomethacin (INDOCIN) 50 MG capsule Take 50 mg by mouth 3 (three) times daily with meals. For 4 days   Yes Historical Provider, MD  insulin aspart (NOVOLOG) 100 UNIT/ML injection Sliding scale  CBG 70 - 120: 0 units: CBG 121 - 150: 2 units; CBG 151 - 200: 3 units; CBG 201 - 250: 5 units; CBG 251 - 300: 8 units;CBG 301 - 350: 11 units; CBG 351 - 400: 15 units; CBG > 400 : 15 units and notify MD Patient taking differently: Inject 2-15 Units into the skin 3 (three) times daily with meals. Sliding scale  CBG 70 - 120: 0 units: CBG 121 - 150: 2 units; CBG 151 - 200: 3 units; CBG 201 - 250: 5 units; CBG 251 - 300: 8 units;CBG 301 - 350: 11 units; CBG 351 - 400: 15 units; CBG > 400 : 15 units and  notify MD 06/15/15  Yes Ripudeep K Rai, MD  nystatin (MYCOSTATIN) 100000 UNIT/ML suspension Take 5 mLs (500,000 Units total) by mouth 4 (four) times daily. 06/15/15  Yes Ripudeep Krystal Eaton, MD  octreotide (SANDOSTATIN) 100 MCG/ML SOLN injection Inject 1 mL (100 mcg total) into the skin every 12 (twelve) hours. 06/15/15  Yes Ripudeep Krystal Eaton, MD  oxycodone (OXY-IR) 5 MG capsule Take 5 mg by mouth every 6 (six) hours as needed (moderate to severe pain).    Yes Historical Provider, MD  pantoprazole (PROTONIX) 40 MG tablet Take 1 tablet (40 mg total) by mouth daily. 12/25/14  Yes Reyne Dumas, MD  traMADol (ULTRAM) 50 MG tablet Take 50 mg by mouth every 6 (six) hours as needed (pain).   Yes Historical Provider, MD  UNABLE TO FIND ADULT TPN per skilled nursing facility protocol QHS/every night (untreated discontinued by surgery). Patient taking differently: Inject 2,040 mLs into the vein every other day. ADULT TPN per skilled nursing facility protocol. Infuse over 12 hours 06/15/15  Yes Ripudeep K Rai, MD  VOLTAREN 1 % GEL Apply 2 g topically 4 (four) times daily as needed (pain).  12/25/14  Yes Historical Provider, MD  methocarbamol (ROBAXIN) 500 MG tablet Take 1 tablet (500 mg total) by mouth every 8 (eight) hours as needed for muscle spasms. 05/21/15   Saverio Danker, PA-C  NITROSTAT 0.4 MG SL  tablet DISSOLVE ONE TABLET UNDER THE TONGUE EVERY 5 MINUTES AS NEEDED FOR CHEST PAIN.  DO NOT EXCEED A TOTAL OF 3 DOSES IN 15 MINUTES 03/30/15   Larey Dresser, MD     Vital Signs: BP 106/53 mmHg  Pulse 85  Temp(Src) 97.8 F (36.6 C) (Oral)  Resp 18  Ht 5\' 4"  (1.626 m)  Wt 234 lb (106.142 kg)  BMI 40.15 kg/m2  SpO2 100%  Physical Exam  Constitutional: She is oriented to person, place, and time.  Cardiovascular: Normal rate and regular rhythm.   Pulmonary/Chest: Effort normal and breath sounds normal. She has no wheezes.  Abdominal:  Large EC fistula eakers pouch in place  Musculoskeletal: Normal range of motion.   Neurological: She is alert and oriented to person, place, and time.  Skin: Skin is warm and dry.  Psychiatric: She has a normal mood and affect. Her behavior is normal. Judgment and thought content normal.    Imaging: Dg Chest Port 1 View  07/25/2015  CLINICAL DATA:  PICC line placement EXAM: PORTABLE CHEST 1 VIEW COMPARISON:  07/13/2015 FINDINGS: Left PICC line tip is at the innominate SVC junction. This could be advanced 5 cm to the SVC RA junction. Normal heart size and vascularity. Minor streaky left base atelectasis. Right lung remains clear. No edema, effusion, or pneumothorax. Trachea midline. Atherosclerosis of the aorta. IMPRESSION: Left PICC line tip at the innominate SVC junction and can be advanced 5 cm to the SVC RA junction. Left base atelectasis Thoracic atherosclerosis Electronically Signed   By: Jerilynn Mages.  Shick M.D.   On: 07/25/2015 12:07    Labs:  CBC:  Recent Labs  07/14/15 0420 07/19/15 0415 07/20/15 1020 07/23/15 0615  WBC 8.5 7.1 7.9 7.9  HGB 10.8* 10.3* 9.8* 8.3*  HCT 32.3* 30.6* 28.6* 27.7*  PLT 190 251 242 247    COAGS:  Recent Labs  12/20/14 1926 12/26/14 1959 04/27/15 1608  INR 1.04 1.08 1.13  APTT  --  25  --     BMP:  Recent Labs  07/23/15 0615 07/24/15 0507 07/25/15 0449 07/26/15 0220  NA 141 142 137 137  K 3.3* 3.3* 3.5 4.3  CL 97* 103 101 103  CO2 33* 33* 30 27  GLUCOSE 127* 144* 145* 107*  BUN 54* 42* 43* 48*  CALCIUM 8.5* 8.5* 8.2* 8.2*  CREATININE 2.92* 2.59* 2.61* 2.88*  GFRNONAA 15* 17* 17* 15*  GFRAA 17* 20* 20* 17*    LIVER FUNCTION TESTS:  Recent Labs  07/16/15 0430 07/20/15 1020 07/22/15 0352 07/23/15 0615  BILITOT 0.3 0.9 0.9 1.0  AST 42* 99* 93* 70*  ALT 58* 135* 161* 136*  ALKPHOS 172* 174* 146* 137*  PROT 8.6* 7.6 6.9 6.0*  ALBUMIN 2.7* 2.8* 2.6* 2.2*    Assessment and Plan:  Post abdominal mesh surgery 04/2015 Now eneterocutaneous fistula Need for on going and continual TPN Scheduled now for  tunneled central line catheter placement in IR Pt and dtr aware of procedure benefits and risks including but not limited to Infection; bleeding; vessel damage Pt is agreeable to proceed Consent signed andin chart  Signed: Tristin Gladman A 07/27/2015, 9:44 AM   I spent a total of 15 Minutes at the the patient's bedside AND on the patient's hospital floor or unit, greater than 50% of which was counseling/coordinating care for central line catheter placement

## 2015-07-27 NOTE — Care Management Important Message (Signed)
Important Message  Patient Details  Name: Marissa [name] MRN: [id number] Date of Birth: Dec 14, 1940   Medicare Important Message Given:  Yes    Deshaun Weisinger P Alia Parsley 07/27/2015, 3:28 PM

## 2015-07-28 LAB — BASIC METABOLIC PANEL
Anion gap: 6 (ref 5–15)
BUN: 32 mg/dL — ABNORMAL HIGH (ref 6–20)
CALCIUM: 8.1 mg/dL — AB (ref 8.9–10.3)
CO2: 27 mmol/L (ref 22–32)
CREATININE: 3.25 mg/dL — AB (ref 0.44–1.00)
Chloride: 107 mmol/L (ref 101–111)
GFR, EST AFRICAN AMERICAN: 15 mL/min — AB (ref >60)
GFR, EST NON AFRICAN AMERICAN: 13 mL/min — AB (ref >60)
GLUCOSE: 114 mg/dL — AB (ref 65–99)
Potassium: 3.7 mmol/L (ref 3.5–5.1)
Sodium: 140 mmol/L (ref 135–145)

## 2015-07-28 LAB — CBC
HEMATOCRIT: 21.9 % — AB (ref 36.0–46.0)
HEMOGLOBIN: 6.8 g/dL — AB (ref 12.0–15.0)
MCH: 24.3 pg — AB (ref 26.0–34.0)
MCHC: 31.1 g/dL (ref 30.0–36.0)
MCV: 78.2 fL (ref 78.0–100.0)
Platelets: 229 10*3/uL (ref 150–400)
RBC: 2.8 MIL/uL — AB (ref 3.87–5.11)
RDW: 14.5 % (ref 11.5–15.5)
WBC: 4.6 10*3/uL (ref 4.0–10.5)

## 2015-07-28 LAB — PREALBUMIN: PREALBUMIN: 11.9 mg/dL — AB (ref 18–38)

## 2015-07-28 LAB — PREPARE RBC (CROSSMATCH)

## 2015-07-28 MED ORDER — INSULIN ASPART 100 UNIT/ML ~~LOC~~ SOLN
0.0000 [IU] | Freq: Four times a day (QID) | SUBCUTANEOUS | Status: DC
  Administered 2015-07-29 – 2015-07-30 (×5): 1 [IU] via SUBCUTANEOUS

## 2015-07-28 MED ORDER — SODIUM CHLORIDE 0.9 % IV SOLN
Freq: Once | INTRAVENOUS | Status: AC
  Administered 2015-07-28: 11:00:00 via INTRAVENOUS

## 2015-07-28 MED ORDER — DEXTROSE-NACL 5-0.45 % IV SOLN
INTRAVENOUS | Status: DC
  Administered 2015-07-28: 1000 mL via INTRAVENOUS

## 2015-07-28 MED ORDER — TRACE MINERALS CR-CU-MN-SE-ZN 10-1000-500-60 MCG/ML IV SOLN
INTRAVENOUS | Status: AC
  Administered 2015-07-28: 17:00:00 via INTRAVENOUS
  Filled 2015-07-28: qty 960

## 2015-07-28 MED ORDER — FAT EMULSION 20 % IV EMUL
240.0000 mL | INTRAVENOUS | Status: AC
  Administered 2015-07-28: 240 mL via INTRAVENOUS
  Filled 2015-07-28: qty 250

## 2015-07-28 NOTE — Progress Notes (Signed)
CRITICAL VALUE ALERT  Critical value received:  Hgb=6.8  Date of notification:  07-28-2015  Time of notification:  0715  Critical value read back:Yes.    Nurse who received alert:  J.Frans Valente,RN  MD notified (1st page): Dr. Tana Coast  Time of first page:  0725  MD notified (2nd page):NA  Time of second page:NA  Responding MD: Dr. Tana Coast  Time MD responded:  [phone]

## 2015-07-28 NOTE — Progress Notes (Addendum)
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   No Known Allergies  Patient Measurements: Height: _0  (162.6 cm) Weight: 241 lb 12.8 oz (109.68 kg) IBW/kg (Calculated) : 54.7    Vital Signs: Temp: 98.1 F (36.7 C) (11/15 0530) Temp Source: Oral (11/15 0530) BP: 100/49 mmHg (11/15 0530) Pulse Rate: 103 (11/15 0530) Intake/Output from previous day: 11/14 0701 - 11/15 0700 In: 2366.7 [P.O.:180; I.V.:2186.7] Out: 800 [Urine:600; Drains:200] Intake/Output from this shift:    Labs:  Recent Labs  07/28/15 0620  WBC 4.6  HGB 6.8*  HCT 21.9*  PLT 229     Recent Labs  07/26/15 0220 07/28/15 0620  NA 137 140  K 4.3 3.7  CL 103 107  CO2 27 27  GLUCOSE 107* 114*  BUN 48* 32*  CREATININE 2.88* 3.25*  CALCIUM 8.2* 8.1*  MG 1.9  --   PREALBUMIN  --  11.9*   Estimated Creatinine Clearance: 18.4 mL/min (by C-G formula based on Cr of 3.25).    Recent Labs  07/25/15 1957 07/26/15 0814 07/26/15 1225  GLUCAP 92 103* 110*   Insulin Requirements in the past 12 hours:  none  Nutritional Goals: per RD note 11/10 1700-1900 kCal, 100-110 grams of protein per day  Current Nutrition:  NPO TPN stopped 11/12 due to VTE above and below PICC line and PICC removed Previously on Clinimix E 5/15 at 80 cc/hr + 20% IVFE at 10 ml/hr  11/15 TPN to be resumed via tunneled catheter  Per discussions/notes from [facility], was receiving cycled Clinimix 5/15 alternated with E 5/15, 2045m over 12 hours (2000-0800). Per RN from facility [facility] patient was receiving TPN during her entire stay, however patient's admission labs/dehydrated state as well as report from daughter make this questionable.  Assessment: [id number] on chronic TPN for a fistula [facility], increased drainage from fistula and severe dehydration. Her EC output was bilious and  Eakin's pouch was removed at facility. CT scan negative for infection however blood cultures from 10/31 were 2/2 CoNS. TPN held for line holiday and PICC was removed. Repeat blood cultures have been clear - and a new PICC was placed 11/10 to resume TPN. PICC removed 11/12 due to VTE above and below PICC line. TPN stopped 11/12.  Tunneled catheter placed by IR 11/14.  To resume TPN 11/5.   Surgeries/Procedures: 04/28/15: wound exploration, explanation of abdominal mesh Multiple other abdominal surgeries  GI:  ECF output/24h:200 cc. Continues on loperamide, octreotide, PPI IV. Pre-albumin 35.1 >>11.9 - dropped b/c was not getting nutrition support during line holiday due to sepsis. Endo: A1C this admission is 6.2 (up from 5.7 in July 2016) . Will resume sensitive SSI Lytes:K 3.7,  Mg 1.9 yesterday , CoCa~9.5 It appears as if the patient's home TPN was alternating between lytes and no lytes.  D545 at 100 ml/hr Renal: AoCKD III d/t dehydration - SCr 2.61>>2.88>>3.25  UOP not accurately charted. On D5-1/2NS at 40 cc/hr.  Hepatobil: LFTs trending down, 70/136 << 93/161, T bili wnl, Alk phos 137 << 146, TG 84 (11/7) Pulm:  OSA.  RA. CPAP at night Cards: EF 55%; VSS- no meds Neuro: Pt more alert ID: Vanc for continued treatment of CoNS PICC line infection - s/p removal 11/4 and replaced 11/10, out 11/12; tunneled catheter 11/14. . S/p CTX for UTI, on nystatin for thrush.  Vanc therapy planned for 2  weeks s/p first neg culture: stop date is 08/02/15 VR 15 mcg/ml on 11/10 down from 32 mcg/ml on 11/8 - calculated ke~0.0158, Vanc restarted on 11/10 AM  11/12 VR = 19.5 drawn 41. 5 hours after last dose. Vanc trough goal is 15-20 mcg/ml.  Afebrile, WBC wnl, creat up to 3.25; on vanc 1250 q48 - last dose 11/14 at 1110 am [facility] Hep SQ TPN Access: PICC placed 11/10 for TPN removed 11/12 11/14 tunneled catheter placed TPN start date: chronic TPN- was on at home prior to admission in Sept, and continued  after discharge. resumed 11/1 at the hospital, off 11/4, resumed 11/10, off 11/12, resumed 11/15  Plan: - continue vancomycin 1250 mg IV q48h - will check vanc trough prior to dose due 11/16 at 10 am - resume TPN with Clinimix E 5/15 at 40 ml/hr + IVFE at 10 ml/hr - will add MVI and trace elements to TPN - goal rate is Clinimix E 5/15 alternating with Clinimix 1/12 (no E) cyclic 1624 mls over 12 hours - re- order TPN labs - re- order SSI/CBGs - decrease 5.45 NS from 100 ml/hr to 50 ml/hr when TPN started Eudelia Bunch, Pharm.D. [phone] 07/28/2015 8:30 AM

## 2015-07-28 NOTE — Progress Notes (Signed)
Pt is alert and oriented. Resting in bed with no complains. Family at bedside conversing with patient. Blood product infusing with no issues. I agree with previous nurse's assessment. Will continue to monitor patient.

## 2015-07-28 NOTE — Progress Notes (Signed)
Patient ID: Marissa [name], female   DOB: 03-26-1941, 74 y.o.   MRN: [id number]     [facility]      [address] [place], [place] [address]    Phone: [phone] FAX: [phone]     Subjective: 224ml out yesterday. Tunneled catheter placed.   Objective:  Vital signs:  Filed Vitals:   07/27/15 1703 07/27/15 2202 07/27/15 2300 07/28/15 0530  BP: 108/63 96/51 100/56 100/49  Pulse: 105 71  103  Temp:  97.8 F (36.6 C)  98.1 F (36.7 C)  TempSrc:  Oral  Oral  Resp: $Remo'16 18  18  'rOAKk$ Height:      Weight:    109.68 kg (241 lb 12.8 oz)  SpO2: 98% 93%  99%    Last BM Date: 07/21/15  Intake/Output   Yesterday:  11/14 0701 - 11/15 0700 In: 2366.7 [P.O.:180; I.V.:2186.7] Out: 800 [Urine:600; Drains:200] This shift:    I/O last 3 completed shifts: In: 3566.7 [P.O.:180; I.V.:3386.7] Out: 900 [Urine:600; Drains:300]    Physical Exam: General: Pt awake/alert/oriented x4 in no acute distress  Abdomen: Soft. Nondistended. eakin's pouch in place with red rubber catheter to suction. Non tender.     Problem List:   Principal Problem:   Acute on chronic renal failure (HCC) Active Problems:   OSA (obstructive sleep apnea)   Essential hypertension   Chronic kidney disease, stage 3   Chronic diastolic CHF (congestive heart failure) (HCC)   Enterocutaneous fistula   Type 2 diabetes mellitus without complication (HCC)   Hyponatremia   Dehydration   Metabolic acidosis   Urinary tract infection   Oral thrush   Acute kidney injury ([place])    Results:   Labs: Results for orders placed or performed during the hospital encounter of 07/13/15 (from the past 48 hour(s))  Glucose, capillary     Status: Abnormal   Collection Time: 07/26/15 12:25 PM  Result Value Ref Range   Glucose-Capillary 110 (H) 65 - 99 mg/dL  Basic metabolic panel     Status: Abnormal   Collection Time: 07/28/15  6:20 AM  Result Value Ref Range   Sodium 140  135 - 145 mmol/L   Potassium 3.7 3.5 - 5.1 mmol/L   Chloride 107 101 - 111 mmol/L   CO2 27 22 - 32 mmol/L   Glucose, Bld 114 (H) 65 - 99 mg/dL   BUN 32 (H) 6 - 20 mg/dL   Creatinine, Ser 3.25 (H) 0.44 - 1.00 mg/dL   Calcium 8.1 (L) 8.9 - 10.3 mg/dL   GFR calc non Af Amer 13 (L) >60 mL/min   GFR calc Af Amer 15 (L) >60 mL/min    Comment: (NOTE) The eGFR has been calculated using the CKD EPI equation. This calculation has not been validated in all clinical situations. eGFR's persistently <60 mL/min signify possible Chronic Kidney Disease.    Anion gap 6 5 - 15  CBC     Status: Abnormal   Collection Time: 07/28/15  6:20 AM  Result Value Ref Range   WBC 4.6 4.0 - 10.5 K/uL   RBC 2.80 (L) 3.87 - 5.11 MIL/uL   Hemoglobin 6.8 (LL) 12.0 - 15.0 g/dL    Comment: REPEATED TO VERIFY CRITICAL RESULT CALLED TO, READ BACK BY AND VERIFIED WITH: J.CARSON,RN [id number] 07/28/15 CLARK,S    HCT 21.9 (L) 36.0 - 46.0 %   MCV 78.2 78.0 - 100.0 fL   MCH 24.3 (L) 26.0 - 34.0 pg  MCHC 31.1 30.0 - 36.0 g/dL   RDW 14.5 11.5 - 15.5 %   Platelets 229 150 - 400 K/uL  Prealbumin     Status: Abnormal   Collection Time: 07/28/15  6:20 AM  Result Value Ref Range   Prealbumin 11.9 (L) 18 - 38 mg/dL    Imaging / Studies: Ir Fluoro Guide Cv Line Right  07/27/2015  INDICATION: 74 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 Patient also has chronic kidney disease. Patient needs IV access for TPN and antibiotics. EXAM: FLUOROSCOPIC AND ULTRASOUND GUIDED PLACEMENT OF A TUNNELED CENTRAL VENOUS CATHETER Physician: Stephan Minister. Henn, MD FLUOROSCOPY TIME:  54 seconds, 23 mGy MEDICATIONS: 1 mg Versed, 25 mcg fentanyl. A radiology nurse monitored the patient for moderate sedation. ANESTHESIA/SEDATION: Moderate sedation time: 20 minutes PROCEDURE: Informed consent was obtained for placement of a tunneled central venous catheter. The patient was placed supine on the interventional table. Ultrasound confirmed a patent right internal  jugularvein. Ultrasound images were obtained for documentation. The right side of the neck was prepped and draped in a sterile fashion. The right side of the neck was anesthetized with 1% lidocaine. Maximal barrier sterile technique was utilized including caps, mask, sterile gowns, sterile gloves, sterile drape, hand hygiene and skin antiseptic. A small incision was made with #11 blade scalpel. A 21 gauge needle directed into the right internal jugular vein with ultrasound guidance. A micropuncture dilator set was placed. A dual lumen Powerline catheter was selected. The skin below the right clavicle was anesthetized and a small incision was made with an #11 blade scalpel. A subcutaneous tunnel was formed to the vein dermatotomy site. The catheter was brought through the tunnel and cut to 22 cm. The vein dermatotomy site was dilated to accommodate a peel-away sheath. The catheter was placed through the peel-away sheath and directed into the central venous structures. The tip of the catheter was placed at the superior cavoatrial junction with fluoroscopy. Fluoroscopic images were obtained for documentation. Both lumens were found to aspirate and flush well. The vein dermatotomy site was closed using a single layer of absorbable suture and Dermabond. The catheter was secured to the skin using Prolene suture. FINDINGS: Catheter tip at the superior cavoatrial junction. Estimated blood loss: Minimal COMPLICATIONS: None IMPRESSION: Successful placement of a right jugular tunneled central venous catheter using ultrasound and fluoroscopic guidance. Electronically Signed   By: Markus Daft M.D.   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 Patient also has chronic kidney disease. Patient needs IV access for TPN and antibiotics. EXAM: FLUOROSCOPIC AND ULTRASOUND GUIDED PLACEMENT OF A TUNNELED CENTRAL VENOUS CATHETER Physician: Stephan Minister. Henn, MD  FLUOROSCOPY TIME:  54 seconds, 23 mGy MEDICATIONS: 1 mg Versed, 25 mcg fentanyl. A radiology nurse monitored the patient for moderate sedation. ANESTHESIA/SEDATION: Moderate sedation time: 20 minutes PROCEDURE: Informed consent was obtained for placement of a tunneled central venous catheter. The patient was placed supine on the interventional table. Ultrasound confirmed a patent right internal jugularvein. Ultrasound images were obtained for documentation. The right side of the neck was prepped and draped in a sterile fashion. The right side of the neck was anesthetized with 1% lidocaine. Maximal barrier sterile technique was utilized including caps, mask, sterile gowns, sterile gloves, sterile drape, hand hygiene and skin antiseptic. A small incision was made with #11 blade scalpel. A 21 gauge needle directed into the right internal jugular vein with ultrasound guidance. A micropuncture dilator set was placed.  A dual lumen Powerline catheter was selected. The skin below the right clavicle was anesthetized and a small incision was made with an #11 blade scalpel. A subcutaneous tunnel was formed to the vein dermatotomy site. The catheter was brought through the tunnel and cut to 22 cm. The vein dermatotomy site was dilated to accommodate a peel-away sheath. The catheter was placed through the peel-away sheath and directed into the central venous structures. The tip of the catheter was placed at the superior cavoatrial junction with fluoroscopy. Fluoroscopic images were obtained for documentation. Both lumens were found to aspirate and flush well. The vein dermatotomy site was closed using a single layer of absorbable suture and Dermabond. The catheter was secured to the skin using Prolene suture. FINDINGS: Catheter tip at the superior cavoatrial junction. Estimated blood loss: Minimal COMPLICATIONS: None IMPRESSION: Successful placement of a right jugular tunneled central venous catheter using ultrasound and  fluoroscopic guidance. Electronically Signed   By: Markus Daft M.D.   On: 07/27/2015 17:40    Medications / Allergies:  Scheduled Meds: . sodium chloride   Intravenous Once  . loperamide  2 mg Oral 4 times per day  . nystatin  5 mL Oral QID  . octreotide  100 mcg Subcutaneous Q12H  . pantoprazole (PROTONIX) IV  40 mg Intravenous Q24H  . vancomycin  1,250 mg Intravenous Q48H   Continuous Infusions: . dextrose 5 % and 0.45% NaCl 100 mL/hr at 07/27/15 2343   PRN Meds:.diphenhydrAMINE, diphenhydrAMINE-zinc acetate, Glycerin (Adult), hydrocerin, hydrOXYzine, iohexol, morphine injection, nitroGLYCERIN, ondansetron **OR** ondansetron (ZOFRAN) IV, oxyCODONE, sodium chloride, sodium chloride  Antibiotics: Anti-infectives    Start     Dose/Rate Route Frequency Ordered Stop   07/27/15 1557  ceFAZolin (ANCEF) 2-3 GM-% IVPB SOLR  Status:  Discontinued    Comments:  [name]   : cabinet override      07/27/15 1557 07/27/15 1619   07/23/15 1000  vancomycin (VANCOCIN) 1,250 mg in sodium chloride 0.9 % 250 mL IVPB     1,250 mg 166.7 mL/hr over 90 Minutes Intravenous Every 48 hours 07/23/15 0908     07/16/15 1000  vancomycin (VANCOCIN) 1,500 mg in sodium chloride 0.9 % 500 mL IVPB  Status:  Discontinued     1,500 mg 250 mL/hr over 120 Minutes Intravenous Every 48 hours 07/14/15 0956 07/15/15 1035   07/16/15 1000  vancomycin (VANCOCIN) IVPB 1000 mg/200 mL premix  Status:  Discontinued     1,000 mg 200 mL/hr over 60 Minutes Intravenous Every 48 hours 07/15/15 1035 07/20/15 1242   07/14/15 1500  cefTRIAXone (ROCEPHIN) 1 g in dextrose 5 % 50 mL IVPB  Status:  Discontinued     1 g 100 mL/hr over 30 Minutes Intravenous Every 24 hours 07/13/15 1647 07/17/15 1113   07/14/15 1015  vancomycin (VANCOCIN) 1,500 mg in sodium chloride 0.9 % 500 mL IVPB     1,500 mg 250 mL/hr over 120 Minutes Intravenous NOW 07/14/15 0943 07/14/15 1210   07/13/15 1400  cefTRIAXone (ROCEPHIN) 1 g in dextrose 5 % 50 mL IVPB      1 g 100 mL/hr over 30 Minutes Intravenous  Once 07/13/15 1359 07/13/15 1526        Assessment/Plan ECF-eakin's pouch, appreciate WOC management. -accurate I&Os -sandostatin/loperamide to slow down transit -cont strict NPO, output has decreased. -SBFT did show EC fistula to be fairly distal in the proximal to mid ileum -NEEDS TPN RESTARTED ASAP. Not sure why it was not resumed as ordered. -pt  is also very deconditioned, needs to be optimized nutritionally and physically before any surgical consideration.  ID-BCs with GPC. repeat Cx 12/7 are negative to date.antibiotic stop date 11/20 per ID FEN-NPO.  Previous PO trials have resulted in increased ECF output, dehydration, renal failure and readmission  PCM- prealbumin today is 11.9.  TPN needs to be resumed.  Acute on chronic renal failure- per medicine, sCr worsening  VTE prophylaxis-SCD/heparin    Erby Pian, [facility] Pager [phone](7A-4:30P) For consults and floor pages call [phone](7A-4:30P)  07/28/2015 8:23 AM

## 2015-07-28 NOTE — Consult Note (Signed)
WOC wound follow up Current pouch is leaking behind the barrier to lower abd area, was just applied this am. Wound type: Full thickness wound to midline abd with fistula, has decreased in size since admission.  Wound bed: 100% red Drainage (amount, consistency, odor) Mod amt green drainage in cannister Periwound: Intact skin surrounding wound is very moist and weeping from unknown etiology despite drying and crusting attempts. It is difficult to maintain a pouch seal related to constant moisture. Dressing procedure/placement/frequency: Applied skin prep to protect skin, then large Eakin pouch with red rubber catheter to low wall suction to control drainage and promote healing. [place] team will continue to follow. Supplies at bedside and instructions provided for staff nurses if leakage occurs. Julien Girt MSN, RN, [place], [place], [place]

## 2015-07-28 NOTE — Progress Notes (Signed)
[facility]                                                                              Patient Demographics  Marissa [name], is a 74 y.o. female, DOB - 17-Apr-1941, [id number]  Admit date - 07/13/2015   Admitting Physician Waldemar Dickens, MD  Outpatient Primary MD for the patient is [name], MD  LOS - 15   Chief Complaint  Patient presents with  . Emesis       Brief HPI    74 y.o. female, with chronic kidney disease, obstructive sleep apnea, diastolic heart failure, type 2 diabetes, multiple abdominal surgeries, and a recent discharge after care of an enterocutaneous fistula, presented to the emergency department from [facility] with lethargy, 2 days of vomiting and severe dehydration.  On 04/28/15 the patient underwent abdominal wound exploration of enterocutaneous fistula and abscess by Dr. Georgette Dover. She was admitted to the hospital on 9/22 with increased ostomy output and confusion. She was treated with TPN and Sandostatin. An eakin pouch was placed, she was discharged on 10/4 to SNF. Patient was readmitted on 10/31 after patient's enterocutaneous fistula began to drain green bile once again, and patient had several episodes of biliary emesis.. Per the patient's daughter, Dr. Georgette Dover had felt the patient could begin to take oral food & drink approximately 2 weeks prior to admission, TPN was discontinued at some point after that. The patient ate fairly well for 4-5 days but then decreased her oral intake. In ED, bicarb was 12, Cr 3.44 from 1.75, BUN 160, sodium 129. Lactic acid was normal. UA positive for UTI. Patient was admitted to hospitalist service and general surgery following.  Gen. surgery has been following the patient closely. Eakin pouch has been placed to intermittent wall suction. Wound care following. Patient had multiple blood cultures positive for coagulase negative staph, prior PICC line was discontinued. Repeat blood cultures from  11/7 were negative, PICC line was placed but then had to be removed on 11/12 due to dislodgment and superficial thrombosis. Currently awaiting tunneled cath/port by interventional radiology. Infectious disease recommended 2 week course of IV vancomycin stop date 11/20 IR placed tunneled right jugular central line on 11/14, TPN restarted  Assessment & Plan   Acute on chronic kidney injury with metabolic acidosis- prerenal secondary to dehydration. Has underlying chronic kidney disease stage III. Creatinine 3.4 at the time of admission, now trending up again - Now creatinine starting to trend up again, up to 3.2 today, on IV fluids Pharmacy managing vancomycin. - Indomethacin, colchicine discontinued this admission, not a candidate for HD per prior consult by Dr. Clover Mealy. -Requested nephrology consult, discussed with Dr. 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 surgery following. Blood cultures from 11/7 and 11/18 remains negative - Eakin pouch placed to intermittent wall suction. Wound care following. - Continue Sandostatin. - Gen. surgery considering surgical options - Unfortunately PICC line had to be removed on 11/12 due to superficial thrombosis and dislodgment, TPN again was placed on hold.  -  port placed by IR on 11/14, TPN started today  Coag negative staph bacteremia with sepsis 2/2 on admission - Placed on empiric IV vancomycin. Repeat blood culture from 11/3 still positive for coag-negative bacteremia. PICC line discontinued and repeat blood culture were sent.  -Patient was rec'd 2 week course of IV vancomycin by ID, Dr. Linus Salmons, (stop date was 11/20 from the clear blood cultures). However, now the creatinine has been trending up, nephrology recommending alternative antibiotic and DC vancomycin. I discussed with Dr Linus Salmons, recommended to DC vancomycin and no other antibiotics at this time as vancomycin will have some  prolonged effect and was close to stop date, blood cultures from 11/7 has been negative so far.  Anemia - No obvious bleeding, has chronic anemia - Hemoglobin 6.8 today, transfuse 2 units packed RBCs  Proteus UTI Pansensitive, completed 5 days of empiric Rocephin.  Transaminitis  LFTs improving Abdominal ultrasound showed mild associated CBD dilatation, cholecystectomy, normal sonographic appearance of liver, no intrahepatic dilatation.  Acute encephalopathy: appears to be back to baseline - Possible secondary to sepsis and dehydration, now back to baseline   Hyponatremia improving  Oral thrush Continue nystatin  Chronic diastolic CHF EF of [id number]. Currently hypovolemic and on gentle hydration.  Obstructive sleep apnea Continue bedtime CPAP  Prediabetes monitor with SSI  Severe malnutrition with failure to thrive TPN again on hold due to lack of central access  OSA On CPAP.  Code Status:DO NOT RESUSCITATE  family Communication: Discussed in detail with the patient, all imaging results, lab results explained to the patient. I had discussed the management with patient's daughter, [name] yesterday.  Disposition Plan:   Time Spent in minutes  25 minutes  Procedures  CT abd   Consults Nephrology General surgery  ID  DVT Prophylaxis heparin subcutaneous   Medications  Scheduled Meds: . [START ON 07/29/2015] insulin aspart  0-9 Units Subcutaneous 4 times per day  . loperamide  2 mg Oral 4 times per day  . nystatin  5 mL Oral QID  . octreotide  100 mcg Subcutaneous Q12H  . pantoprazole (PROTONIX) IV  40 mg Intravenous Q24H  . vancomycin  1,250 mg Intravenous Q48H   Continuous Infusions: . Marland KitchenTPN (CLINIMIX-E) Adult     And  . fat emulsion     PRN Meds:.diphenhydrAMINE, diphenhydrAMINE-zinc acetate, Glycerin (Adult), hydrocerin, hydrOXYzine, iohexol, morphine injection, nitroGLYCERIN, ondansetron **OR** ondansetron (ZOFRAN) IV, oxyCODONE, sodium chloride,  sodium chloride   Antibiotics   Anti-infectives    Start     Dose/Rate Route Frequency Ordered Stop   07/27/15 1557  ceFAZolin (ANCEF) 2-3 GM-% IVPB SOLR  Status:  Discontinued    Comments:  [name]   : cabinet override      07/27/15 1557 07/27/15 1619   07/23/15 1000  vancomycin (VANCOCIN) 1,250 mg in sodium chloride 0.9 % 250 mL IVPB     1,250 mg 166.7 mL/hr over 90 Minutes Intravenous Every 48 hours 07/23/15 0908     07/16/15 1000  vancomycin (VANCOCIN) 1,500 mg in sodium chloride 0.9 % 500 mL IVPB  Status:  Discontinued     1,500 mg 250 mL/hr over 120 Minutes Intravenous Every 48 hours 07/14/15 0956 07/15/15 1035   07/16/15 1000  vancomycin (VANCOCIN) IVPB 1000 mg/200 mL premix  Status:  Discontinued     1,000 mg 200 mL/hr over 60 Minutes Intravenous Every 48 hours 07/15/15 1035 07/20/15 1242   07/14/15 1500  cefTRIAXone (ROCEPHIN) 1 g in dextrose 5 % 50 mL IVPB  Status:  Discontinued     1 g 100  mL/hr over 30 Minutes Intravenous Every 24 hours 07/13/15 1647 07/17/15 1113   07/14/15 1015  vancomycin (VANCOCIN) 1,500 mg in sodium chloride 0.9 % 500 mL IVPB     1,500 mg 250 mL/hr over 120 Minutes Intravenous NOW 07/14/15 0943 07/14/15 1210   07/13/15 1400  cefTRIAXone (ROCEPHIN) 1 g in dextrose 5 % 50 mL IVPB     1 g 100 mL/hr over 30 Minutes Intravenous  Once 07/13/15 1359 07/13/15 1526        Subjective:   Ladeja Fortna was seen and examined today. No complaints, no fevers or chills, no acute events overnight.  Patient denies dizziness, chest pain, shortness of breath, new weakness, numbess, tingling. Eakin pouch to wall suction  Objective:   Blood pressure 104/58, pulse 81, temperature 98.6 F (37 C), temperature source Oral, resp. rate 16, height 5\' 4"  (1.626 m), weight 109.68 kg (241 lb 12.8 oz), SpO2 98 %.  Wt Readings from Last 3 Encounters:  07/28/15 109.68 kg (241 lb 12.8 oz)  06/15/15 102.7 kg (226 lb 6.6 oz)  05/01/15 99.3 kg (218 lb 14.7 oz)      Intake/Output Summary (Last 24 hours) at 07/28/15 1344 Last data filed at 07/28/15 1333  Gross per 24 hour  Intake 2396.67 ml  Output    800 ml  Net 1596.67 ml    Exam  General: Alert and oriented, NAD  HEENT:  PERRLA, EOMI  Neck: Supple, no JVD  CVS: S1 S2 clear, RRR  Respiratory: CTAB  Abdomen: Soft, midline wound, eakin pouch to wall suction   Ext: no c/c/e.   Neuro: no new deficits  Skin: No rashes  Psych: Normal affect and demeanor, alert    Data Review   Micro Results Recent Results (from the past 240 hour(s))  Culture, blood (routine x 2)     Status: None   Collection Time: 07/20/15  8:04 PM  Result Value Ref Range Status   Specimen Description BLOOD LEFT HAND  Final   Special Requests IN PEDIATRIC BOTTLE [place]  Final   Culture NO GROWTH 5 DAYS  Final   Report Status 07/25/2015 FINAL  Final  Culture, blood (single)     Status: None   Collection Time: 07/21/15  6:10 AM  Result Value Ref Range Status   Specimen Description BLOOD LEFT HAND  Final   Special Requests IN PEDIATRIC BOTTLE  [place]  Final   Culture NO GROWTH 5 DAYS  Final   Report Status 07/26/2015 FINAL  Final    Radiology Reports Ct Abdomen Pelvis Wo Contrast  07/13/2015  CLINICAL DATA:  Subsequent encounter for nausea vomiting with low abdominal pain and increased drainage from fistula. EXAM: CT ABDOMEN AND PELVIS WITHOUT CONTRAST TECHNIQUE: Multidetector CT imaging of the abdomen and pelvis was performed following the standard protocol without IV contrast. COMPARISON:  06/04/2015. FINDINGS: Lower chest: Motion artifact with some atelectasis in the dependent bases. Hepatobiliary: No focal abnormality in the liver on this study without intravenous contrast. No evidence of hepatomegaly. Gallbladder is surgically absent. No intrahepatic or extrahepatic biliary dilation. Pancreas: No focal mass lesion. No dilatation of the main duct. No intraparenchymal cyst. No peripancreatic edema. Spleen: No  splenomegaly. No focal mass lesion. Adrenals/Urinary Tract: No adrenal nodule or mass. No mass lesion evident in either kidney on this study without intravenous contrast material. No evidence for hydroureteronephrosis. The urinary bladder appears normal for the degree of distention. Stomach/Bowel: Small hiatal hernia noted. Metallic foreign body identified along the posterior wall  of the gastric fundus. This is been present on multiple prior studies is well. Duodenum is normally positioned as is the ligament of Treitz. No small bowel wall thickening. No small bowel dilatation. The terminal ileum is normal. The appendix is not visualized, but there is no edema or inflammation in the region of the cecum. Diverticuli are seen scattered along the entire length of the colon without CT findings of diverticulitis. Small bowel adhesions to the anterior abdominal wall again noted. There is soft tissue attenuation in the midline anterior abdominal wall, at the site of previous surgical wound. This is the area where enterocutaneous fistula was visualized on the previous study. No gas in this region today but no oral contrast was administered to allow assessment of the fistula. Vascular/Lymphatic: There is abdominal aortic atherosclerosis without aneurysm. There is no gastrohepatic or hepatoduodenal ligament lymphadenopathy. No intraperitoneal or retroperitoneal lymphadenopy. No pelvic sidewall lymphadenopathy. Reproductive: Uterus is surgically absent. There is no adnexal mass. Other: No intraperitoneal free fluid. Musculoskeletal: Bone windows reveal no worrisome lytic or sclerotic osseous lesions. IMPRESSION: Stable exam. No new or acute interval findings. There is no gas in the region of the chronic enterocutaneous fistula no evidence to suggest interval development of an abscess in this region. Fistula not well assessed given the lack of oral contrast on today's study. No evidence for bowel obstruction. No intraperitoneal  fluid collection and no intraperitoneal free fluid. Electronically Signed   By: Misty Stanley M.D.   On: 07/13/2015 13:27   Dg Chest 2 View  07/13/2015  CLINICAL DATA:  Emesis.  CHF. EXAM: CHEST  2 VIEW COMPARISON:  06/04/2015 chest radiograph FINDINGS: Right PICC terminates in the lower third of the superior vena cava. Stable cardiomediastinal silhouette with mild cardiomegaly. No pneumothorax. No pleural effusion. Clear lungs, with no focal lung consolidation and no pulmonary edema. IMPRESSION: Stable mild cardiomegaly without pulmonary edema. Lungs appear clear. Electronically Signed   By: Ilona Sorrel M.D.   On: 07/13/2015 12:43   US Abdomen Complete  07/21/2015  CLINICAL DATA:  Elevated liver function studies. EXAM: ULTRASOUND ABDOMEN COMPLETE COMPARISON:  Multiple prior ultrasound and CT examinations. FINDINGS: Gallbladder: Surgically absent Common bile duct: Diameter: 7.1 mm Liver: Normal echogenicity without focal lesion or biliary dilatation. IVC: Normal caliber Pancreas: Poorly visualized but normal on recent CT scans. Spleen: Normal size.  No focal lesions. Right Kidney: Length: 10.4 cm. Mild renal cortical thinning but no mass or hydronephrosis. Left Kidney: Length: 10.3 cm. Mild renal cortical thinning but no mass or hydronephrosis. Abdominal aorta: Normal caliber Other findings: None. IMPRESSION: Status post cholecystectomy with mild associated common bile duct dilatation. Normal sonographic appearance of the liver. No intrahepatic biliary dilatation. Poor visualization of the pancreas but it was normal on recent CT scans. Electronically Signed   By: Marijo Sanes M.D.   On: 07/21/2015 23:43   US Renal  07/17/2015  CLINICAL DATA:  Renal insufficiency EXAM: RENAL ULTRASOUND COMPARISON:  Renal ultrasound June 08, 2015; CT abdomen and pelvis July 13, 2015 FINDINGS: Right Kidney: Length: 11.1 cm. Echogenicity and renal cortical thickness are within normal limits. No perinephric fluid or  hydronephrosis visualized. There is a cyst in the upper pole region measuring 1.5 x 1.5 x 1.5 cm. No sonographically demonstrable calculus or ureterectasis. Left Kidney: Length: 10.8 cm. Echogenicity and renal cortical thickness are within normal limits. No mass, perinephric fluid, or hydronephrosis visualized. No sonographically demonstrable calculus. Bladder: Completely empty and cannot be assessed. IMPRESSION: Cyst arising from upper pole  right kidney. Study otherwise unremarkable. Note the urinary bladder is empty and cannot be assessed. Electronically Signed   By: Lowella Grip III M.D.   On: 07/17/2015 11:45   Ir Fluoro Guide Cv Line Right  07/27/2015  INDICATION: 74 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 Patient also has chronic kidney disease. Patient needs IV access for TPN and antibiotics. EXAM: FLUOROSCOPIC AND ULTRASOUND GUIDED PLACEMENT OF A TUNNELED CENTRAL VENOUS CATHETER Physician: Stephan Minister. Henn, MD FLUOROSCOPY TIME:  54 seconds, 23 mGy MEDICATIONS: 1 mg Versed, 25 mcg fentanyl. A radiology nurse monitored the patient for moderate sedation. ANESTHESIA/SEDATION: Moderate sedation time: 20 minutes PROCEDURE: Informed consent was obtained for placement of a tunneled central venous catheter. The patient was placed supine on the interventional table. Ultrasound confirmed a patent right internal jugularvein. Ultrasound images were obtained for documentation. The right side of the neck was prepped and draped in a sterile fashion. The right side of the neck was anesthetized with 1% lidocaine. Maximal barrier sterile technique was utilized including caps, mask, sterile gowns, sterile gloves, sterile drape, hand hygiene and skin antiseptic. A small incision was made with #11 blade scalpel. A 21 gauge needle directed into the right internal jugular vein with ultrasound guidance. A micropuncture dilator set was placed. A dual lumen Powerline catheter was selected. The skin below the right clavicle was  anesthetized and a small incision was made with an #11 blade scalpel. A subcutaneous tunnel was formed to the vein dermatotomy site. The catheter was brought through the tunnel and cut to 22 cm. The vein dermatotomy site was dilated to accommodate a peel-away sheath. The catheter was placed through the peel-away sheath and directed into the central venous structures. The tip of the catheter was placed at the superior cavoatrial junction with fluoroscopy. Fluoroscopic images were obtained for documentation. Both lumens were found to aspirate and flush well. The vein dermatotomy site was closed using a single layer of absorbable suture and Dermabond. The catheter was secured to the skin using Prolene suture. FINDINGS: Catheter tip at the superior cavoatrial junction. Estimated blood loss: Minimal COMPLICATIONS: None IMPRESSION: Successful placement of a right jugular tunneled central venous catheter using ultrasound and fluoroscopic guidance. Electronically Signed   By: Markus Daft M.D.   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 Patient also has chronic kidney disease. Patient needs IV access for TPN and antibiotics. EXAM: FLUOROSCOPIC AND ULTRASOUND GUIDED PLACEMENT OF A TUNNELED CENTRAL VENOUS CATHETER Physician: Stephan Minister. Henn, MD FLUOROSCOPY TIME:  54 seconds, 23 mGy MEDICATIONS: 1 mg Versed, 25 mcg fentanyl. A radiology nurse monitored the patient for moderate sedation. ANESTHESIA/SEDATION: Moderate sedation time: 20 minutes PROCEDURE: Informed consent was obtained for placement of a tunneled central venous catheter. The patient was placed supine on the interventional table. Ultrasound confirmed a patent right internal jugularvein. Ultrasound images were obtained for documentation. The right side of the neck was prepped and draped in a sterile fashion. The right side of the neck was anesthetized with 1% lidocaine. Maximal barrier  sterile technique was utilized including caps, mask, sterile gowns, sterile gloves, sterile drape, hand hygiene and skin antiseptic. A small incision was made with #11 blade scalpel. A 21 gauge needle directed into the right internal jugular vein with ultrasound guidance. A micropuncture dilator set was placed. A dual lumen Powerline catheter was selected. The skin below the right clavicle was anesthetized and a small incision was made with an #11 blade scalpel.  A subcutaneous tunnel was formed to the vein dermatotomy site. The catheter was brought through the tunnel and cut to 22 cm. The vein dermatotomy site was dilated to accommodate a peel-away sheath. The catheter was placed through the peel-away sheath and directed into the central venous structures. The tip of the catheter was placed at the superior cavoatrial junction with fluoroscopy. Fluoroscopic images were obtained for documentation. Both lumens were found to aspirate and flush well. The vein dermatotomy site was closed using a single layer of absorbable suture and Dermabond. The catheter was secured to the skin using Prolene suture. FINDINGS: Catheter tip at the superior cavoatrial junction. Estimated blood loss: Minimal COMPLICATIONS: None IMPRESSION: Successful placement of a right jugular tunneled central venous catheter using ultrasound and fluoroscopic guidance. Electronically Signed   By: Markus Daft M.D.   On: 07/27/2015 17:40   Dg Chest Port 1 View  07/25/2015  CLINICAL DATA:  PICC line placement EXAM: PORTABLE CHEST 1 VIEW COMPARISON:  07/13/2015 FINDINGS: Left PICC line tip is at the innominate SVC junction. This could be advanced 5 cm to the SVC RA junction. Normal heart size and vascularity. Minor streaky left base atelectasis. Right lung remains clear. No edema, effusion, or pneumothorax. Trachea midline. Atherosclerosis of the aorta. IMPRESSION: Left PICC line tip at the innominate SVC junction and can be advanced 5 cm to the SVC RA  junction. Left base atelectasis Thoracic atherosclerosis Electronically Signed   By: Jerilynn Mages.  Shick M.D.   On: 07/25/2015 12:07   Dg Ugi W/small Bowel  07/20/2015  CLINICAL DATA:  74 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 Prior repair. Question level of fistula. Subsequent encounter. EXAM: UPPER GI SERIES WITH SMALL BOWEL FOLLOW-THROUGH FLUOROSCOPY TIME:  Radiation Exposure Index (as provided by the fluoroscopic device): 1634.12 micro Gy TECHNIQUE: Water-soluble upper GI series followed by small bowel were obtained after discussion with Dr. Kieth Brightly. COMPARISON:  None. FINDINGS: Draining clips are noted on scout view. Patient was only able to ingest a small amount of Omnipaque 300 at one time. Total of 150 cc was ingested. The present examination was specifically tailored to evaluate for level of the enterocutaneous fistula. Gastroesophageal reflux noted. Patient was kept in the upright position to avoid aspiration. Scattered duodenal diverticulum. Nonspecific eggshell calcification right upper quadrant. Enterocutaneous fistulas best seen on lateral overhead imaging. This appears to arise from the proximal to mid ileum. Distal aspect of the small bowel not visualized. Patient was tired of the exam at this point. Study terminated. IMPRESSION: Enterocutaneous fistulas best seen on lateral overhead imaging. This appears to arise from the proximal to mid ileum. Exam otherwise limited as noted above. Electronically Signed   By: Genia Del M.D.   On: 07/20/2015 15:46    CBC  Recent Labs Lab 07/23/15 0615 07/28/15 0620  WBC 7.9 4.6  HGB 8.3* 6.8*  HCT 27.7* 21.9*  PLT 247 229  MCV 80.5 78.2  MCH 24.1* 24.3*  MCHC 30.0 31.1  RDW 14.3 14.5    Chemistries   Recent Labs Lab 07/22/15 0352 07/23/15 0615 07/24/15 0507 07/25/15 0449 07/26/15 0220 07/28/15 0620  NA 147* 141 142 137 137 140  K 3.4* 3.3* 3.3* 3.5 4.3 3.7  CL 99* 97* 103 101 103 107  CO2 34* 33* 33* 30 27 27   GLUCOSE  134* 127* 144* 145* 107* 114*  BUN 76* 54* 42* 43* 48* 32*  CREATININE 3.08* 2.92* 2.59* 2.61* 2.88* 3.25*  CALCIUM 8.5* 8.5* 8.5* 8.2* 8.2* 8.1*  MG  --  1.6* 1.7 2.0 1.9  --   AST 93* 70*  --   --   --   --   ALT 161* 136*  --   --   --   --   ALKPHOS 146* 137*  --   --   --   --   BILITOT 0.9 1.0  --   --   --   --    ------------------------------------------------------------------------------------------------------------------ estimated creatinine clearance is 18.4 mL/min (by C-G formula based on Cr of 3.25). ------------------------------------------------------------------------------------------------------------------ No results for input(s): HGBA1C in the last 72 hours. ------------------------------------------------------------------------------------------------------------------ No results for input(s): CHOL, HDL, LDLCALC, TRIG, CHOLHDL, LDLDIRECT in the last 72 hours. ------------------------------------------------------------------------------------------------------------------ No results for input(s): TSH, T4TOTAL, T3FREE, THYROIDAB in the last 72 hours.  Invalid input(s): FREET3 ------------------------------------------------------------------------------------------------------------------ No results for input(s): VITAMINB12, FOLATE, FERRITIN, TIBC, IRON, RETICCTPCT in the last 72 hours.  Coagulation profile No results for input(s): INR, PROTIME in the last 168 hours.  No results for input(s): DDIMER in the last 72 hours.  Cardiac Enzymes No results for input(s): CKMB, TROPONINI, MYOGLOBIN in the last 168 hours.  Invalid input(s): CK ------------------------------------------------------------------------------------------------------------------ Invalid input(s): [place]  07/25/15 1616 07/25/15 1957 07/26/15 0814 07/26/15 1225  GLUCAP 24 92 103* 110*     Anis Degidio M.D. [facility] 07/28/2015, 1:44 PM  Pager:  [phone] Between 7am to 7pm - call Pager - [phone]  After 7pm go to [email or web address] - password [email or web address]  Call night coverage person covering after 7pm

## 2015-07-28 NOTE — Progress Notes (Signed)
PT Cancellation Note  Patient Details Name: Marissa [name] MRN: [id number] DOB: October 24, 1940   Cancelled Treatment:     Pt refused therapy due to weakness/tiredness. Pt was educated on all the benefits of getting OOB and the repercussions of laying in bed all day.   Sundra Aland, [facility]   07/28/2015, 11:45 AM

## 2015-07-28 NOTE — Clinical Social Work Note (Signed)
CSW received call from patient's daughter, [name], regarding patient's discharge disposition. Patient's daughter informed CSW patient's family refusing for patient to return to [facility] at time of discharge. CSW informed patient's daughter of other [facility] SNFs available that can accommodate patient's current feeding plan. Per patient's daughter, patient's daughter to have patient discharged home with home health services (also requesting hospital bed).  CSW encouraged patient's daughter to contact CSW if discharge disposition were to change. Patient's daughter expressed understanding.  RNCM updated regarding information above.  CSW signing off.  Lubertha Sayres, [facility]: [phone] Surgical: [phone]

## 2015-07-28 NOTE — Progress Notes (Signed)
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   Events Since Last Progress Note:  11/9 - 11/12: Creatinine improved with ongoing hydration. Had PICC line replaced. Negative Blood Cultures since 11/7. Was receiving IVF with D5 0.45%NS.  11/12 - 11/15: Had superficial vein thrombosis and dislodgement of PICC on 11/12. Was continued on D5 0.45%NS at 100cc/hr while PICC out. Had a tunneled Jugular line placed 11/14. TPN d/c'd on 11/12. Restarted on 11/15. Has had large volume output from EC Fistula. Continues to have inaccurate I/O's, though total volume received indicates volume up status. Weight up steadily every other day. Unclear if accurate or not. Increasing Cr since 11/12:  2.61 > 2.88 > 3.25. She has been continued on Vancomycin for CNS bacteremia with stop date of 11/20.   Subjective:  She is much more lucid today than when I saw her one week ago. She says she is overall feeling better, but feels frustrated with attempts to get her up out of bed to walk. She says her nausea is improving. She is urinating in the bed pan, 2-3 times per day she says. She is alert and oriented.    11/14 0701 - 11/15 0700 In: 2366.7 [P.O.:180; I.V.:2186.7] Out: 800 [Urine:600; Drains:200]  Filed Weights   07/25/15 0616 07/27/15 0608 07/28/15 0530  Weight: 226 lb 3.1 oz (102.6 kg) 234 lb (106.142 kg) 241 lb 12.8 oz (109.68 kg)    Scheduled Meds: . [START ON 07/29/2015] insulin aspart  0-9 Units Subcutaneous 4 times per day  . loperamide  2 mg Oral 4 times per day  . nystatin  5 mL Oral QID  . octreotide  100 mcg Subcutaneous Q12H  . pantoprazole (PROTONIX) IV  40 mg Intravenous Q24H  . vancomycin  1,250 mg Intravenous Q48H   Continuous Infusions: . dextrose 5 % and 0.45% NaCl 1,000 mL (07/28/15 0925)  . Marland KitchenTPN (CLINIMIX-E) Adult     And  . fat emulsion     PRN  Meds:.diphenhydrAMINE, diphenhydrAMINE-zinc acetate, Glycerin (Adult), hydrocerin, hydrOXYzine, iohexol, morphine injection, nitroGLYCERIN, ondansetron **OR** ondansetron (ZOFRAN) IV, oxyCODONE, sodium chloride, sodium chloride  Current Labs: reviewed    Physical Exam:  Blood pressure 100/49, pulse 103, temperature 98.1 F (36.7 C), temperature source Oral, resp. rate 18, height 5\' 4"  (1.626 m), weight 241 lb 12.8 oz (109.68 kg), SpO2 99 %. Gen: AAOx3, Talking easily.  CV: Intermittent skipped beats, No rubs, no murmurs. Normal S1/S2 Resp: Crackles from the mid fields to the bases bilaterally. Appropriate rate, unlabored.   Ext: WWP, 1-2+ edema, especially dependent edema near her thigh. Pain in LLE as before likely due to gout.  Eakins pouch noted with good seal, and drainage to wall canister. approx 50cc in canister.  Neuro: No asterixis this am.   A/P 1. AoCKD: Initially with  profound azotemia due to dehydration / hypovolemia with Indomethacin as the etiology. This improved, but subsequently developed rising creatinine with BUN continuing to trend down. BUN 32 this am, Cr 3.25, Weight steadily increasing, hard to say how accurately. She is volume up on exam with hypoalbuminemia / malnutrition. Likely low oncotic pressure. Has not received contrast. Has been on Vancomycin.  1. Would discontinue IVF's.  2. Recommend discontinuing Vancomycin and switch to alternative.  3. Check U/A 4. Renal US 11/4 w/o obstruction  5. Continue TPN 6. Not a dialysis candidate. BUN improving,  Cr rising. 7.   Daily weights, Daily Renal Panel, Strict I/Os, Avoid nephrotoxins (NSAIDs, judicious IV Contrast). 1. Chrnoic enterocutaneous fistula, intermittent high outpt, TPN dependency -- surgery following. Supposed to restart TPN today.   2. NAG metabolic acidosis: Resolved.  3. AMS: Back to baseline. Uremia resolved.     4. CNS bacteremia and Proteus UTI 1. Negative Cx to date. Replaced PICC and then IR placed  central line.  2. Getting Vancomycin until 11/20 per ID and IM.  Recommend switching to alternative.  3. Abx per primary team: S/p tx of UTI with Ceftriaxone.   5. FTT -- goals of care / palliative care seem reasonable to explore at this time  Paula Compton, MD PGY 2             Recent Labs Lab 07/23/15 0615 07/24/15 0507 07/25/15 0449 07/26/15 0220 07/28/15 0620  NA 141 142 137 137 140  K 3.3* 3.3* 3.5 4.3 3.7  CL 97* 103 101 103 107  CO2 33* 33* 30 27 27   GLUCOSE 127* 144* 145* 107* 114*  BUN 54* 42* 43* 48* 32*  CREATININE 2.92* 2.59* 2.61* 2.88* 3.25*  CALCIUM 8.5* 8.5* 8.2* 8.2* 8.1*  PHOS 3.4 3.8  --   --   --     Recent Labs Lab 07/23/15 0615 07/28/15 0620  WBC 7.9 4.6  HGB 8.3* 6.8*  HCT 27.7* 21.9*  MCV 80.5 78.2  PLT 247 229

## 2015-07-28 NOTE — Care Management Note (Signed)
Case Management Note  Patient Details  Name: Marissa [name] MRN: [id number] Date of Birth: 1941-07-10  Subjective/Objective:                    Action/Plan:  Spoke with patient's daughter Secundino Ginger would like to take her mother  home at discharge , with home health through [place] . [name] can provide 24 hour supervision at home . [name] would like a hospital bed for home.   Discussed referral with Santiago Glad at [facility] . Suction to Eakin pouch would have to be discontinued before patient discharged home. Home health unable to provide continuous suction at home.  Expected Discharge Date:                  Expected Discharge Plan:  [place]  In-House Referral:  Clinical Social Work  Discharge planning Services  CM Consult  Post Acute Care Choice:    Choice offered to:  Adult Children  DME Arranged:    DME Agency:     HH Arranged:    HH Agency:     Status of Service:  In process, will continue to follow  Medicare Important Message Given:  Yes Date Medicare IM Given:    Medicare IM give by:    Date Additional Medicare IM Given:    Additional Medicare Important Message give by:     If discussed at [place] of Stay Meetings, dates discussed:    Additional Comments:  Marilu Favre, RN 07/28/2015, 3:49 PM

## 2015-07-28 NOTE — Progress Notes (Signed)
OT Cancellation Note  Patient Details Name: Marissa [name] MRN: [id number] DOB: 1941-06-08   Cancelled Treatment:    Reason Eval/Treat Not Completed: Medical issues which prohibited therapy. Pt Hemoglobin is 6.8 and has pending blood transfusion, will return at next appropriate time.   Lin Landsman 07/28/2015, 11:05 AM

## 2015-07-28 NOTE — Progress Notes (Signed)
Patient refused to work with PT today stating she had no energy

## 2015-07-28 NOTE — Progress Notes (Signed)
Nutrition Follow-up  DOCUMENTATION CODES:   Obesity unspecified  INTERVENTION:   -TPN management per pharmacy  NUTRITION DIAGNOSIS:   Inadequate oral intake related to inability to eat as evidenced by NPO status.  Ongoing  GOAL:   Patient will meet greater than or equal to 90% of their needs  Progressing  MONITOR:   Diet advancement, Weight trends, Labs, I & O's, Skin  REASON FOR ASSESSMENT:   Consult New TPN/TNA  ASSESSMENT:   74 y.o. 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  TPN on hold since 07/26/15, due to superficical thrombosis of left arm. ID placed tunneled rt jugular central line on 07/27/15. TPN to be re-started today.   Per pharmacy note, plan to resume TPN of Clinimix E 5/15 at 40 ml/hr + IVFE at 10 ml/hr (which provides 1162 kcals and 48 grams protein, which meets 68% of estimated kcal needs and 48% of estimated protein needs. Goal rate is Clinimix E 5/15 alternating with Clinimix 123456 (no E) cyclic [id number] mls over 12 hours.   Reviewed [facility] note from 07/28/15; current pouch is leaking behind the barrier to the lower abdomen area. Seal is difficult to maintain due to constant moisture. Eakin pouch was changed and re-connected to low wall suction. Pt remains with full thickness wound to midline abdomen with fistula, which has decreased in size since admission.   CSW following; plan to return to SNF once medically stable.   Labs reviewed.   Diet Order:  Diet NPO time specified Except for: Sips with Meds TPN (CLINIMIX-E) Adult  Skin:  Wound (see comment) (medical abdominal wound with eakin to suction)  Last BM:  07/21/15  Height:   Ht Readings from Last 1 Encounters:  07/15/15 5\' 4"  (1.626 m)    Weight:   Wt Readings from Last 1 Encounters:  07/28/15 241 lb 12.8 oz (109.68 kg)    Ideal Body  Weight:  54.5 kg  BMI:  Body mass index is 41.48 kg/(m^2).  Estimated Nutritional Needs:   Kcal:  1700-1900  Protein:  100-110 grams  Fluid:  1.7-1.9 L/day  EDUCATION NEEDS:   No education needs identified at this time  Minie Roadcap A. Jimmye Norman, RD, LDN, CDE Pager: [phone] After hours Pager: [phone]

## 2015-07-28 NOTE — Consult Note (Signed)
WOC wound follow up Current pouch is leaking behind the barrier to lower abd area. Wound type: Full thickness wound to midline abd with fistula, has decreased in size since admission.6X1.3X.3cm  Wound bed: 100% red Drainage (amount, consistency, odor) Mod amt green drainage in cannister Periwound: Intact skin surrounding wound is macerated and very moist and weeping despite drying and crusting attempts. It is difficult to maintain a pouch seal related to constant moisture. Dressing procedure/placement/frequency: Applied skin prep to protect skin, then large Eakin pouch with red rubber catheter to low wall suction to control drainage and promote healing. [place] team will continue to follow. Supplies at bedside and instructions provided for staff nurses if leakage occurs. Julien Girt MSN, RN, [place], [place], [place]

## 2015-07-28 NOTE — Care Management Note (Signed)
Case Management Note  Patient Details  Name: Marissa [name] MRN: [id number] Date of Birth: 1941-08-30  Subjective/Objective:                    Action/Plan:   Expected Discharge Date:                  Expected Discharge Plan:  Skilled Nursing Facility  In-House Referral:  Clinical Social Work  Discharge planning Services     Post Acute Care Choice:    Choice offered to:     DME Arranged:    DME Agency:     HH Arranged:    [place] Agency:     Status of Service:  In process, will continue to follow  Medicare Important Message Given:  Yes Date Medicare IM Given:    Medicare IM give by:    Date Additional Medicare IM Given:    Additional Medicare Important Message give by:     If discussed at [place] of Stay Meetings, dates discussed:  07-28-15  Additional Comments: UR updated  Marilu Favre, RN 07/28/2015, 10:34 AM

## 2015-07-29 DIAGNOSIS — G4733 Obstructive sleep apnea (adult) (pediatric): Secondary | ICD-10-CM

## 2015-07-29 LAB — CBC
HEMATOCRIT: 27.4 % — AB (ref 36.0–46.0)
HEMOGLOBIN: 8.5 g/dL — AB (ref 12.0–15.0)
MCH: 24.6 pg — AB (ref 26.0–34.0)
MCHC: 31 g/dL (ref 30.0–36.0)
MCV: 79.2 fL (ref 78.0–100.0)
Platelets: 219 10*3/uL (ref 150–400)
RBC: 3.46 MIL/uL — ABNORMAL LOW (ref 3.87–5.11)
RDW: 14.7 % (ref 11.5–15.5)
WBC: 4.7 10*3/uL (ref 4.0–10.5)

## 2015-07-29 LAB — TYPE AND SCREEN
ABO/RH(D): B POS
ANTIBODY SCREEN: NEGATIVE
UNIT DIVISION: 0
Unit division: 0

## 2015-07-29 LAB — PHOSPHORUS: PHOSPHORUS: 4.3 mg/dL (ref 2.5–4.6)

## 2015-07-29 LAB — MAGNESIUM: Magnesium: 1.8 mg/dL (ref 1.7–2.4)

## 2015-07-29 LAB — COMPREHENSIVE METABOLIC PANEL
ALK PHOS: 80 U/L (ref 38–126)
ALT: 29 U/L (ref 14–54)
ANION GAP: 6 (ref 5–15)
AST: 16 U/L (ref 15–41)
Albumin: 1.8 g/dL — ABNORMAL LOW (ref 3.5–5.0)
BILIRUBIN TOTAL: 0.7 mg/dL (ref 0.3–1.2)
BUN: 29 mg/dL — ABNORMAL HIGH (ref 6–20)
CALCIUM: 8.2 mg/dL — AB (ref 8.9–10.3)
CO2: 26 mmol/L (ref 22–32)
CREATININE: 3.37 mg/dL — AB (ref 0.44–1.00)
Chloride: 109 mmol/L (ref 101–111)
GFR calc non Af Amer: 12 mL/min — ABNORMAL LOW (ref >60)
GFR, EST AFRICAN AMERICAN: 14 mL/min — AB (ref >60)
Glucose, Bld: 135 mg/dL — ABNORMAL HIGH (ref 65–99)
Potassium: 3.7 mmol/L (ref 3.5–5.1)
Sodium: 141 mmol/L (ref 135–145)
Total Protein: 5.1 g/dL — ABNORMAL LOW (ref 6.5–8.1)

## 2015-07-29 LAB — URINE MICROSCOPIC-ADD ON

## 2015-07-29 LAB — URINALYSIS, ROUTINE W REFLEX MICROSCOPIC
Bilirubin Urine: NEGATIVE
GLUCOSE, UA: NEGATIVE mg/dL
Ketones, ur: NEGATIVE mg/dL
Nitrite: NEGATIVE
PH: 5 (ref 5.0–8.0)
Protein, ur: NEGATIVE mg/dL
SPECIFIC GRAVITY, URINE: 1.007 (ref 1.005–1.030)

## 2015-07-29 LAB — GLUCOSE, CAPILLARY
GLUCOSE-CAPILLARY: 116 mg/dL — AB (ref 65–99)
GLUCOSE-CAPILLARY: 120 mg/dL — AB (ref 65–99)
GLUCOSE-CAPILLARY: 127 mg/dL — AB (ref 65–99)
GLUCOSE-CAPILLARY: 134 mg/dL — AB (ref 65–99)
Glucose-Capillary: 124 mg/dL — ABNORMAL HIGH (ref 65–99)

## 2015-07-29 MED ORDER — FUROSEMIDE 10 MG/ML IJ SOLN
40.0000 mg | Freq: Two times a day (BID) | INTRAMUSCULAR | Status: DC
  Administered 2015-07-29 – 2015-08-01 (×7): 40 mg via INTRAVENOUS
  Filled 2015-07-29 (×8): qty 4

## 2015-07-29 MED ORDER — TRACE MINERALS CR-CU-MN-SE-ZN 10-1000-500-60 MCG/ML IV SOLN
INTRAVENOUS | Status: AC
  Administered 2015-07-29: 17:00:00 via INTRAVENOUS
  Filled 2015-07-29: qty 1992

## 2015-07-29 MED ORDER — HEPARIN SODIUM (PORCINE) 5000 UNIT/ML IJ SOLN
5000.0000 [IU] | Freq: Three times a day (TID) | INTRAMUSCULAR | Status: DC
  Administered 2015-07-29 – 2015-08-04 (×19): 5000 [IU] via SUBCUTANEOUS
  Filled 2015-07-29 (×16): qty 1

## 2015-07-29 MED ORDER — FAT EMULSION 20 % IV EMUL
240.0000 mL | INTRAVENOUS | Status: AC
  Administered 2015-07-29: 240 mL via INTRAVENOUS
  Filled 2015-07-29: qty 250

## 2015-07-29 NOTE — Progress Notes (Signed)
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   No Known Allergies  Patient Measurements: Height: _0  (162.6 cm) Weight: 241 lb 1.6 oz (109.362 kg) IBW/kg (Calculated) : 54.7    Vital Signs: Temp: 98.1 F (36.7 C) (11/16 0636) Temp Source: Oral (11/16 0636) BP: 97/44 mmHg (11/16 0636) Pulse Rate: 96 (11/16 0636) Intake/Output from previous day: 11/15 0701 - 11/16 0700 In: 1310 [Blood:760; TPN:550] Out: 550 [Urine:250; Drains:300] Intake/Output from this shift:    Labs:  Recent Labs  07/28/15 0620 07/29/15 0450  WBC 4.6 4.7  HGB 6.8* 8.5*  HCT 21.9* 27.4*  PLT 229 219     Recent Labs  07/28/15 0620 07/29/15 0450  NA 140 141  K 3.7 3.7  CL 107 109  CO2 27 26  GLUCOSE 114* 135*  BUN 32* 29*  CREATININE 3.25* 3.37*  CALCIUM 8.1* 8.2*  MG  --  1.8  PHOS  --  4.3  PROT  --  5.1*  ALBUMIN  --  1.8*  AST  --  16  ALT  --  29  ALKPHOS  --  80  BILITOT  --  0.7  PREALBUMIN 11.9*  --    Estimated Creatinine Clearance: 17.7 mL/min (by C-G formula based on Cr of 3.37).    Recent Labs  07/29/15 0043 07/29/15 0543 07/29/15 0804  GLUCAP 134* 124* 120*   Insulin Requirements in the past 12 hours:  2 units  Nutritional Goals: per RD note 11/10 1700-1900 kCal, 100-110 grams of protein per day  Current Nutrition:  NPO TPN stopped 11/12 due to VTE above and below PICC line and PICC removed Previously on Clinimix E 5/15 at 80 cc/hr + 20% IVFE at 10 ml/hr  11/15 TPN resumed via tunneled catheter at 40 ml/hr + IVFE at 10 ml/hr  Per discussions/notes from [facility], was receiving cycled Clinimix 5/15 alternated with E 5/15, 2067m over 12 hours (2000-0800). [place] during her entire stay, however patient's admission labs/dehydrated state as well as report from daughter make this questionable.  Assessment: [id number] on chronic TPN for a  fistula [facility], increased drainage from fistula and severe dehydration. Her EC output was bilious and Eakin's pouch was removed at facility. CT scan negative for infection however blood cultures from 10/31 were 2/2 CoNS. TPN held for line holiday and PICC was removed. Repeat blood cultures have been clear - and a new PICC was placed 11/10 to resume TPN. PICC removed 11/12 due to VTE above and below PICC line. TPN stopped 11/12.  Tunneled catheter placed by IR 11/14.   resumed TPN 11/5.   Surgeries/Procedures: 04/28/15: wound exploration, explanation of abdominal mesh Multiple other abdominal surgeries  GI:  ECF output/24h:300 cc. Continues on loperamide, octreotide, PPI IV. Pre-albumin 35.1 >>11.9 - dropped b/c was not getting nutrition support during line holiday due to sepsis. Endo: A1C this admission is 6.2 (up from 5.7 in July 2016) .  sensitive SSI, 2 units given, CGS < 150 w/ TPN at 40 ml/hr Lytes:K 3.7,  Mg 1.8,phos 4.3 Ca~9.96  It appears as if the patient's home TPN was alternating between lytes and no lytes.   Renal: AoCKD III d/t dehydration - SCr 2.61>>2.88>>3.25>>3.37. IVF stopped 11/15 Hepatobil: LFTs trending down, now WNL,  T bili wnl, Alk phos WNL now, TG 84 (11/7) Pulm:  OSA.  RA. CPAP at night Cards: EF 55%; VSS- no meds Neuro: Pt more alert ID: Vanc for continued treatment of CoNS PICC line infection was dc'd 11/15 due to renal worsening- OKd by ID    - s/p PICC removal 11/4 and replaced 11/10, out 11/12; tunneled catheter 11/14. . S/p CTX for UTI, on nystatin for thrush.  Best Practices Hep SQ TPN Access: PICC placed 11/10 for TPN removed 11/12 11/14 tunneled catheter placed TPN start date: chronic TPN- was on at home prior to admission in Sept, and continued after discharge. resumed 11/1 at the hospital, off 11/4, resumed 11/10, off 11/12, resumed 11/15  Plan: - increase Clinimix E 5/15 to 83  ml/hr +  IVFE at 10 ml/hr - continue MVI and trace elements in TPN - goal rate is Clinimix E 5/15 alternating with Clinimix 0/74 (no E) cyclic 6002 mls over 12 hours - continue SSI until at goal rate, will probably be able to DC it then Eudelia Bunch, Pharm.D. [phone] 07/29/2015 9:03 AM

## 2015-07-29 NOTE — Progress Notes (Signed)
Occupational Therapy Treatment Patient Details Name: Marissa [name] MRN: [id number] DOB: [birth year] Today's Date: 07/29/2015    History of present illness 74 yo female admitted 10/31 from SNF due to dehydration, lethargic,renal failure,  UTI and decr PO intake after TPN stopped. PMH: CKD, obstructive sleep apnea, DMII, HTN, hidradenitis suppurativa, diastolic heart failure, [id number] abdominal wound explorationof enterocutaneous fistula and absecess, 9/22 admitted with incr ostomy output and AMS d/c SNF 10/4   OT comments  Focus of session included grooming while sitting EOB and transfers to chair. Pt at set up level for grooming activities and requires +2 mod A for sit to stand and stand-pivot transfer. Pt required constant cueing and encouragement to complete stand-pivot to chair. Pt displaying mild cognitive deficits during session. Overall, pt making progress towards OT goals.  Follow Up Recommendations  Home health OT    Equipment Recommendations  3 in 1 bedside comode;Hospital bed    Recommendations for Other Services      Precautions / Restrictions Precautions Precautions: Fall Precaution Comments: wound pouch in place mid-stomach region Restrictions Weight Bearing Restrictions: No       Mobility Bed Mobility Overal bed mobility: Needs Assistance Bed Mobility: Rolling;Sidelying to Sit Rolling: Supervision Sidelying to sit: Min assist       General bed mobility comments: Pt required min to complete bed mobility. Used pad to straighten hips and bring bil LE feet flat on floor. Pt required rest breaks to complete  Transfers Overall transfer level: Needs assistance Equipment used: 2 person hand held assist Transfers: Sit to/from [facility] Sit to Stand: +2 physical assistance;Mod assist Stand pivot transfers: +2 physical assistance;Mod assist       General transfer comment: Pt able to stand with +2 mod A for safety, pt reporting not having any feeling in  her bil feet and very fearful of falling. Pt required constant cueing for completion of stand-pivot transfer to chair, pt required cues for safety and sequence.     Balance Overall balance assessment: Needs assistance Sitting-balance support: Single extremity supported;Feet supported Sitting balance-Leahy Scale: Fair Sitting balance - Comments: Pt able to complete grooming task with single UE support, fatigues fairly quickly and requires rest breaks.   Standing balance support: Bilateral upper extremity supported Standing balance-Leahy Scale: Poor Standing balance comment: pt able to stand with +2 assistance for safety.                   ADL Overall ADL's : Needs assistance/impaired     Grooming: Wash/dry face;Set up;Sitting;Bed level                                 General ADL Comments: Pt completed bed mobility and sat EOB for ~83minutes for simple ADL task. Encouragment needed.      Vision                     Perception     Praxis      Cognition   Behavior During Therapy: [facility] for tasks assessed/performed;Anxious Overall Cognitive Status: No family/caregiver present to determine baseline cognitive functioning                       Extremity/Trunk Assessment               Exercises     Shoulder Instructions       General Comments  Pertinent Vitals/ Pain       Pain Assessment: Faces Faces Pain Scale: Hurts little more Pain Location: Rt arm and stomach Pain Descriptors / Indicators: Grimacing;Guarding Pain Intervention(s): Monitored during session;Repositioned  Home Living                                          Prior Functioning/Environment              Frequency Min 2X/week     Progress Toward Goals  OT Goals(current goals can now be found in the care plan section)  Progress towards OT goals: Progressing toward goals  Acute Rehab OT Goals Patient Stated Goal: to go home OT Goal  Formulation: With patient Time For Goal Achievement: 07/28/15 Potential to Achieve Goals: Fair ADL Goals Pt Will Perform Grooming: sitting;with set-up Pt Will Transfer to Toilet: with +2 assist;with total assist;squat pivot transfer;bedside commode Pt Will Perform Toileting - Clothing Manipulation and hygiene: with mod assist;sitting/lateral leans Pt/caregiver will Perform Home Exercise Program: Increased strength;Both right and left upper extremity;With theraband;With Supervision Additional ADL Goal #1: Pt will be mod A for bed mobility for precursor to ADL participation  Plan Discharge plan needs to be updated    Co-evaluation                 End of Session Equipment Utilized During Treatment: Gait belt   Activity Tolerance Patient limited by fatigue   Patient Left in chair;with call bell/phone within reach   Nurse Communication          Time: 1400-1419 OT Time Calculation (min): 19 min  Charges: OT General Charges $OT Visit: 1 Procedure OT Treatments $Self Care/Home Management : 8-22 mins  Lin Landsman 07/29/2015, 2:55 PM

## 2015-07-29 NOTE — Progress Notes (Signed)
Patient ID: Marissa [name], female   DOB: 02-Jun-1941, 74 y.o.   MRN: [id number]    Subjective: Pt sleepy, doesn't interact much today.    Objective: Vital signs in last 24 hours: Temp:  [98 F (36.7 C)-99.1 F (37.3 C)] 98.1 F (36.7 C) (11/16 0636) Pulse Rate:  [78-108] 96 (11/16 0636) Resp:  [14-18] 16 (11/16 0636) BP: (90-104)/(33-69) 97/44 mmHg (11/16 0636) SpO2:  [95 %-100 %] 100 % (11/16 0636) Weight:  [109.362 kg (241 lb 1.6 oz)] 109.362 kg (241 lb 1.6 oz) (11/16 0636) Last BM Date: 07/26/15  Intake/Output from previous day: 11/15 0701 - 11/16 0700 In: 1310 [Blood:760; TPN:550] Out: 550 [Urine:250; Drains:300] Intake/Output this shift:    PE: Abd: soft, Eakin's pouch leaking at the bottom of the bag.  350cc our so far today already.    Lab Results:   Recent Labs  07/28/15 0620 07/29/15 0450  WBC 4.6 4.7  HGB 6.8* 8.5*  HCT 21.9* 27.4*  PLT 229 219   BMET  Recent Labs  07/28/15 0620 07/29/15 0450  NA 140 141  K 3.7 3.7  CL 107 109  CO2 27 26  GLUCOSE 114* 135*  BUN 32* 29*  CREATININE 3.25* 3.37*  CALCIUM 8.1* 8.2*   PT/INR No results for input(s): LABPROT, INR in the last 72 hours. CMP     Component Value Date/Time   NA 141 07/29/2015 0450   K 3.7 07/29/2015 0450   CL 109 07/29/2015 0450   CO2 26 07/29/2015 0450   GLUCOSE 135* 07/29/2015 0450   BUN 29* 07/29/2015 0450   CREATININE 3.37* 07/29/2015 0450   CREATININE 1.64* 06/27/2014 1647   CALCIUM 8.2* 07/29/2015 0450   PROT 5.1* 07/29/2015 0450   ALBUMIN 1.8* 07/29/2015 0450   AST 16 07/29/2015 0450   ALT 29 07/29/2015 0450   ALKPHOS 80 07/29/2015 0450   BILITOT 0.7 07/29/2015 0450   GFRNONAA 12* 07/29/2015 0450   GFRAA 14* 07/29/2015 0450   Lipase     Component Value Date/Time   LIPASE 27 12/26/2014 1815       Studies/Results: Ir Fluoro Guide Cv Line Right  07/27/2015  INDICATION: 74 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 Patient also has chronic kidney disease. Patient  needs IV access for TPN and antibiotics. EXAM: FLUOROSCOPIC AND ULTRASOUND GUIDED PLACEMENT OF A TUNNELED CENTRAL VENOUS CATHETER Physician: Stephan Minister. Henn, MD FLUOROSCOPY TIME:  54 seconds, 23 mGy MEDICATIONS: 1 mg Versed, 25 mcg fentanyl. A radiology nurse monitored the patient for moderate sedation. ANESTHESIA/SEDATION: Moderate sedation time: 20 minutes PROCEDURE: Informed consent was obtained for placement of a tunneled central venous catheter. The patient was placed supine on the interventional table. Ultrasound confirmed a patent right internal jugularvein. Ultrasound images were obtained for documentation. The right side of the neck was prepped and draped in a sterile fashion. The right side of the neck was anesthetized with 1% lidocaine. Maximal barrier sterile technique was utilized including caps, mask, sterile gowns, sterile gloves, sterile drape, hand hygiene and skin antiseptic. A small incision was made with #11 blade scalpel. A 21 gauge needle directed into the right internal jugular vein with ultrasound guidance. A micropuncture dilator set was placed. A dual lumen Powerline catheter was selected. The skin below the right clavicle was anesthetized and a small incision was made with an #11 blade scalpel. A subcutaneous tunnel was formed to the vein dermatotomy site. The catheter was brought through the tunnel and cut to 22 cm. The vein dermatotomy site was dilated to  accommodate a peel-away sheath. The catheter was placed through the peel-away sheath and directed into the central venous structures. The tip of the catheter was placed at the superior cavoatrial junction with fluoroscopy. Fluoroscopic images were obtained for documentation. Both lumens were found to aspirate and flush well. The vein dermatotomy site was closed using a single layer of absorbable suture and Dermabond. The catheter was secured to the skin using Prolene suture. FINDINGS: Catheter tip at the superior cavoatrial junction.  Estimated blood loss: Minimal COMPLICATIONS: None IMPRESSION: Successful placement of a right jugular tunneled central venous catheter using ultrasound and fluoroscopic guidance. Electronically Signed   By: Markus Daft M.D.   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 Patient also has chronic kidney disease. Patient needs IV access for TPN and antibiotics. EXAM: FLUOROSCOPIC AND ULTRASOUND GUIDED PLACEMENT OF A TUNNELED CENTRAL VENOUS CATHETER Physician: Stephan Minister. Henn, MD FLUOROSCOPY TIME:  54 seconds, 23 mGy MEDICATIONS: 1 mg Versed, 25 mcg fentanyl. A radiology nurse monitored the patient for moderate sedation. ANESTHESIA/SEDATION: Moderate sedation time: 20 minutes PROCEDURE: Informed consent was obtained for placement of a tunneled central venous catheter. The patient was placed supine on the interventional table. Ultrasound confirmed a patent right internal jugularvein. Ultrasound images were obtained for documentation. The right side of the neck was prepped and draped in a sterile fashion. The right side of the neck was anesthetized with 1% lidocaine. Maximal barrier sterile technique was utilized including caps, mask, sterile gowns, sterile gloves, sterile drape, hand hygiene and skin antiseptic. A small incision was made with #11 blade scalpel. A 21 gauge needle directed into the right internal jugular vein with ultrasound guidance. A micropuncture dilator set was placed. A dual lumen Powerline catheter was selected. The skin below the right clavicle was anesthetized and a small incision was made with an #11 blade scalpel. A subcutaneous tunnel was formed to the vein dermatotomy site. The catheter was brought through the tunnel and cut to 22 cm. The vein dermatotomy site was dilated to accommodate a peel-away sheath. The catheter was placed through the peel-away sheath and directed into the central venous structures. The tip of  the catheter was placed at the superior cavoatrial junction with fluoroscopy. Fluoroscopic images were obtained for documentation. Both lumens were found to aspirate and flush well. The vein dermatotomy site was closed using a single layer of absorbable suture and Dermabond. The catheter was secured to the skin using Prolene suture. FINDINGS: Catheter tip at the superior cavoatrial junction. Estimated blood loss: Minimal COMPLICATIONS: None IMPRESSION: Successful placement of a right jugular tunneled central venous catheter using ultrasound and fluoroscopic guidance. Electronically Signed   By: Markus Daft M.D.   On: 07/27/2015 17:40    Anti-infectives: Anti-infectives    Start     Dose/Rate Route Frequency Ordered Stop   07/27/15 1557  ceFAZolin (ANCEF) 2-3 GM-% IVPB SOLR  Status:  Discontinued    Comments:  [name]   : cabinet override      07/27/15 1557 07/27/15 1619   07/23/15 1000  vancomycin (VANCOCIN) 1,250 mg in sodium chloride 0.9 % 250 mL IVPB  Status:  Discontinued     1,250 mg 166.7 mL/hr over 90 Minutes Intravenous Every 48 hours 07/23/15 0908 07/28/15 1353   07/16/15 1000  vancomycin (VANCOCIN) 1,500 mg in sodium chloride 0.9 % 500 mL IVPB  Status:  Discontinued     1,500 mg 250 mL/hr over  120 Minutes Intravenous Every 48 hours 07/14/15 0956 07/15/15 1035   07/16/15 1000  vancomycin (VANCOCIN) IVPB 1000 mg/200 mL premix  Status:  Discontinued     1,000 mg 200 mL/hr over 60 Minutes Intravenous Every 48 hours 07/15/15 1035 07/20/15 1242   07/14/15 1500  cefTRIAXone (ROCEPHIN) 1 g in dextrose 5 % 50 mL IVPB  Status:  Discontinued     1 g 100 mL/hr over 30 Minutes Intravenous Every 24 hours 07/13/15 1647 07/17/15 1113   07/14/15 1015  vancomycin (VANCOCIN) 1,500 mg in sodium chloride 0.9 % 500 mL IVPB     1,500 mg 250 mL/hr over 120 Minutes Intravenous NOW 07/14/15 0943 07/14/15 1210   07/13/15 1400  cefTRIAXone (ROCEPHIN) 1 g in dextrose 5 % 50 mL IVPB     1 g 100 mL/hr  over 30 Minutes Intravenous  Once 07/13/15 1359 07/13/15 1526       Assessment/Plan   ECF-eakin's pouch, appreciate WOC management. -accurate I&Os -sandostatin/loperamide to slow down transit -cont strict NPO, output has decreased, but up top 350cc so far today -SBFT did show EC fistula to be fairly distal in the proximal to mid ileum -pt is also very deconditioned, needs to be optimized nutritionally and physically before any surgical consideration.  -cont TNA ID-BCs with GPC. repeat Cx 12/7 are negative to date.antibiotic stop date 11/20 per ID FEN-NPO. Previous PO trials have resulted in increased ECF output, dehydration, renal failure and readmission  PCM- prealbumin is 11.9. TNA restarted  Acute on chronic renal failure- per medicine, sCr worsening  VTE prophylaxis-SCD/heparin    LOS: 16 days    Wendal Wilkie E 07/29/2015, 9:43 AM Pager: [phone]

## 2015-07-29 NOTE — Progress Notes (Addendum)
PATIENT DETAILS Name: Marissa [name] Age: 74 y.o. Sex: female Date of Birth: 06-28-1941 Admit Date: 07/13/2015 Admitting Physician Waldemar Dickens, MD [id number] Ronnald Ramp, MD  Brief narrative: Patient is a 74 year old female with known history of enterocutaneous fistula-who was recently admitted on 9/22 for increased ostomy output with encephalopathy. She was treated with TNA/Sandostatin-a eakin pouch was placed, she was subsequently discharged to SNF on 10/4. Patient was readmitted on 10/31, after increased biliary drainage from her fistula along with biliary emesis. Patient was found to have acute on chronic kidney disease, coagulase negative Staphylococcus bacteremia. General surgery/infectious disease has been following patient. Her prior PICC line has been discontinued, and a Port-A-Cath has been placed. Unfortunately, hospital course has now been complicated by redevelopment of acute renal failure, along with severe failure to thrive syndrome. Plans are for definite surgical repair of enterocutaneous fistula once patient is nutritionally and physically ready. Currently remains nothing by mouth on TNA.  Subjective: Appears weak and deconditioned-no other complaints.  Assessment/Plan: Principal Problem: Acute on chronic renal failure stage III: Felt to have acute renal failure secondary to prerenal azotemia from dehydration/high ostomy output which initially improved, now with worsening creatinine again-felt secondary to vancomycin. Nephrology following and directing care. Per nephrology, patient not a long-term candidate for hemodialysis.  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 Continue Sandostatin. Nothing by mouth-on TNA-still with significant ostomy output. Per general surgery-currently a poor surgical candidate for repair of enterocutaneous  fistula-needs to be optimized both nutritionally and physically before proceeding with surgery.  Sepsis secondary to coagulase negative Staphylococcus bacteremia: Likely catheter-related bacteremia-likely present on admission.PICC line has been discontinued.Port placed by IR on 11/14. Although recommendations were to stop vancomycin on 11/20-given worsening renal function-vancomycin was discontinued on 11/15. Dr. Tana Coast spoke with infectious disease M.D.-Dr Linus Salmons who recommended to observe off antibiotics. Note blood cultures on 11/7 and 11/8 negative so far.   Anemia: Secondary to acute on chronic illness/CKD- transfuse 2 units of PRBC on 11/15-follow hemoglobin. No overt evidence of any blood loss.  Proteus [id number], completed 5 days of empiric Rocephin.  Transaminitis: Resolved. Likely secondary to sepsis/shock liver. History of cholecystectomy. Abdominal ultrasound on 11/8 did not show any major intrahepatic/extrahepatic findings.  Acute encephalopathy: Likely secondary to sepsis-resolved. Although very deconditioned/weak-is awake and alert.  Chronic diastolic heart failure: Although significantly above admitting weight-no overt signs of decompensation. IV fluids discontinued. May need initiation of Lasix at some point.  Oral thrush: Continue nystatin  Hyponatremia: Resolved.  Severe malnutrition with failure to thrive:on TPN-PT/OT following. Spoke wit Daughter-[name]-who understands poor overall prognoses-she wants to consider some palliative measures-while patient attempts to recuperate from numerous illness-therefore have consulted Palliative care team  OSA (obstructive sleep apnea): continue CPAP  Disposition: Remain inpatient  Antimicrobial agents  See below  Anti-infectives    Start     Dose/Rate Route Frequency Ordered Stop   07/27/15 1557  ceFAZolin (ANCEF) 2-3 GM-% IVPB SOLR  Status:  Discontinued    Comments:  [name]   : cabinet override       07/27/15 1557 07/27/15 1619   07/23/15 1000  vancomycin (VANCOCIN) 1,250 mg in sodium chloride 0.9 % 250 mL IVPB  Status:  Discontinued     1,250 mg 166.7 mL/hr over 90 Minutes Intravenous Every 48 hours 07/23/15 0908 07/28/15 1353   07/16/15 1000  vancomycin (VANCOCIN) 1,500 mg in sodium chloride 0.9 % 500 mL IVPB  Status:  Discontinued     1,500 mg 250 mL/hr over 120 Minutes Intravenous Every 48 hours 07/14/15 0956 07/15/15 1035   07/16/15 1000  vancomycin (VANCOCIN) IVPB 1000 mg/200 mL premix  Status:  Discontinued     1,000 mg 200 mL/hr over 60 Minutes Intravenous Every 48 hours 07/15/15 1035 07/20/15 1242   07/14/15 1500  cefTRIAXone (ROCEPHIN) 1 g in dextrose 5 % 50 mL IVPB  Status:  Discontinued     1 g 100 mL/hr over 30 Minutes Intravenous Every 24 hours 07/13/15 1647 07/17/15 1113   07/14/15 1015  vancomycin (VANCOCIN) 1,500 mg in sodium chloride 0.9 % 500 mL IVPB     1,500 mg 250 mL/hr over 120 Minutes Intravenous NOW 07/14/15 0943 07/14/15 1210   07/13/15 1400  cefTRIAXone (ROCEPHIN) 1 g in dextrose 5 % 50 mL IVPB     1 g 100 mL/hr over 30 Minutes Intravenous  Once 07/13/15 1359 07/13/15 1526      DVT Prophylaxis:  Prophylactic Heparin  Code Status:  DNR  Family Communication Lillie Aoun-Daughter over the phone  Procedures: IR placed tunneled right jugular central line on 11/14  CONSULTS:  general surgery   Renal  Time spent 40 minutes-Greater than 50% of this time was spent in counseling, explanation of diagnosis, planning of further management, and coordination of care.  MEDICATIONS: Scheduled Meds: . furosemide  40 mg Intravenous Q12H  . insulin aspart  0-9 Units Subcutaneous 4 times per day  . loperamide  2 mg Oral 4 times per day  . nystatin  5 mL Oral QID  . octreotide  100 mcg Subcutaneous Q12H  . pantoprazole (PROTONIX) IV  40 mg Intravenous Q24H   Continuous Infusions: . Marland KitchenTPN (CLINIMIX-E) Adult 40 mL/hr at 07/29/15 0600   And  . fat emulsion  240 mL (07/29/15 0600)  . Marland KitchenTPN (CLINIMIX-E) Adult     And  . fat emulsion     PRN Meds:.diphenhydrAMINE, diphenhydrAMINE-zinc acetate, Glycerin (Adult), hydrocerin, hydrOXYzine, iohexol, morphine injection, nitroGLYCERIN, ondansetron **OR** ondansetron (ZOFRAN) IV, oxyCODONE, sodium chloride, sodium chloride    PHYSICAL EXAM: Vital signs in last 24 hours: Filed Vitals:   07/28/15 1937 07/28/15 2045 07/28/15 2140 07/29/15 0636  BP: 90/68 94/50 94/44  97/44  Pulse: 105 98 95 96  Temp: 98.8 F (37.1 C) 98.8 F (37.1 C) 98.4 F (36.9 C) 98.1 F (36.7 C)  TempSrc: Oral Oral Oral Oral  Resp: 16 16 16 16   Height:      Weight:    109.362 kg (241 lb 1.6 oz)  SpO2: 100% 98% 100% 100%    Weight change: -0.318 kg (-11.2 oz) Filed Weights   07/27/15 0608 07/28/15 0530 07/29/15 0636  Weight: 106.142 kg (234 lb) 109.68 kg (241 lb 12.8 oz) 109.362 kg (241 lb 1.6 oz)   Body mass index is 41.36 kg/(m^2).   Gen Exam: Awake and alert with clear but slow speech. Significantly deconditioned and weak. Neck: Supple, Chest: B/L Clear.   CVS: S1 S2 Regular, no murmurs.  Abdomen: Soft, midline wound, eakin pouch to wall suction Extremities: + edema, lower extremities warm to touch. Neurologic: Non Focal.       Intake/Output from previous day:  Intake/Output Summary (Last 24 hours) at 07/29/15 1444 Last data filed at 07/29/15 0900  Gross per 24 hour  Intake   1280 ml  Output    550 ml  Net  730 ml     LAB RESULTS: CBC  Recent Labs Lab 07/23/15 0615 07/28/15 0620 07/29/15 0450  WBC 7.9 4.6 4.7  HGB 8.3* 6.8* 8.5*  HCT 27.7* 21.9* 27.4*  PLT 247 229 219  MCV 80.5 78.2 79.2  MCH 24.1* 24.3* 24.6*  MCHC 30.0 31.1 31.0  RDW 14.3 14.5 14.7    Chemistries   Recent Labs Lab 07/23/15 0615 07/24/15 0507 07/25/15 0449 07/26/15 0220 07/28/15 0620 07/29/15 0450  NA 141 142 137 137 140 141  K 3.3* 3.3* 3.5 4.3 3.7 3.7  CL 97* 103 101 103 107 109  CO2 33* 33* 30 27 27 26     GLUCOSE 127* 144* 145* 107* 114* 135*  BUN 54* 42* 43* 48* 32* 29*  CREATININE 2.92* 2.59* 2.61* 2.88* 3.25* 3.37*  CALCIUM 8.5* 8.5* 8.2* 8.2* 8.1* 8.2*  MG 1.6* 1.7 2.0 1.9  --  1.8    CBG:  Recent Labs Lab 07/26/15 1225 07/29/15 0043 07/29/15 0543 07/29/15 0804 07/29/15 1310  GLUCAP 110* 134* 124* 120* 116*    GFR Estimated Creatinine Clearance: 17.7 mL/min (by C-G formula based on Cr of 3.37).  Coagulation profile No results for input(s): INR, PROTIME in the last 168 hours.  Cardiac Enzymes No results for input(s): CKMB, TROPONINI, MYOGLOBIN in the last 168 hours.  Invalid input(s): CK  Invalid input(s): POCBNP No results for input(s): DDIMER in the last 72 hours. No results for input(s): HGBA1C in the last 72 hours. No results for input(s): CHOL, HDL, LDLCALC, TRIG, CHOLHDL, LDLDIRECT in the last 72 hours. No results for input(s): TSH, T4TOTAL, T3FREE, THYROIDAB in the last 72 hours.  Invalid input(s): FREET3 No results for input(s): VITAMINB12, FOLATE, FERRITIN, TIBC, IRON, RETICCTPCT in the last 72 hours. No results for input(s): LIPASE, AMYLASE in the last 72 hours.  Urine Studies No results for input(s): UHGB, CRYS in the last 72 hours.  Invalid input(s): UACOL, UAPR, USPG, UPH, UTP, UGL, UKET, UBIL, UNIT, UROB, ULEU, UEPI, UWBC, URBC, UBAC, CAST, UCOM, BILUA  MICROBIOLOGY: Recent Results (from the past 240 hour(s))  Culture, blood (routine x 2)     Status: None   Collection Time: 07/20/15  8:04 PM  Result Value Ref Range Status   Specimen Description BLOOD LEFT HAND  Final   Special Requests IN PEDIATRIC BOTTLE [place]  Final   Culture NO GROWTH 5 DAYS  Final   Report Status 07/25/2015 FINAL  Final  Culture, blood (single)     Status: None   Collection Time: 07/21/15  6:10 AM  Result Value Ref Range Status   Specimen Description BLOOD LEFT HAND  Final   Special Requests IN PEDIATRIC BOTTLE  [place]  Final   Culture NO GROWTH 5 DAYS  Final   Report  Status 07/26/2015 FINAL  Final    RADIOLOGY STUDIES/RESULTS: Ct Abdomen Pelvis Wo Contrast  07/13/2015  CLINICAL DATA:  Subsequent encounter for nausea vomiting with low abdominal pain and increased drainage from fistula. EXAM: CT ABDOMEN AND PELVIS WITHOUT CONTRAST TECHNIQUE: Multidetector CT imaging of the abdomen and pelvis was performed following the standard protocol without IV contrast. COMPARISON:  06/04/2015. FINDINGS: Lower chest: Motion artifact with some atelectasis in the dependent bases. Hepatobiliary: No focal abnormality in the liver on this study without intravenous contrast. No evidence of hepatomegaly. Gallbladder is surgically absent. No intrahepatic or extrahepatic biliary dilation. Pancreas: No focal mass lesion. No dilatation of the main duct. No intraparenchymal cyst. No peripancreatic edema. Spleen: No splenomegaly. No focal  mass lesion. Adrenals/Urinary Tract: No adrenal nodule or mass. No mass lesion evident in either kidney on this study without intravenous contrast material. No evidence for hydroureteronephrosis. The urinary bladder appears normal for the degree of distention. Stomach/Bowel: Small hiatal hernia noted. Metallic foreign body identified along the posterior wall of the gastric fundus. This is been present on multiple prior studies is well. Duodenum is normally positioned as is the ligament of Treitz. No small bowel wall thickening. No small bowel dilatation. The terminal ileum is normal. The appendix is not visualized, but there is no edema or inflammation in the region of the cecum. Diverticuli are seen scattered along the entire length of the colon without CT findings of diverticulitis. Small bowel adhesions to the anterior abdominal wall again noted. There is soft tissue attenuation in the midline anterior abdominal wall, at the site of previous surgical wound. This is the area where enterocutaneous fistula was visualized on the previous study. No gas in this region  today but no oral contrast was administered to allow assessment of the fistula. Vascular/Lymphatic: There is abdominal aortic atherosclerosis without aneurysm. There is no gastrohepatic or hepatoduodenal ligament lymphadenopathy. No intraperitoneal or retroperitoneal lymphadenopy. No pelvic sidewall lymphadenopathy. Reproductive: Uterus is surgically absent. There is no adnexal mass. Other: No intraperitoneal free fluid. Musculoskeletal: Bone windows reveal no worrisome lytic or sclerotic osseous lesions. IMPRESSION: Stable exam. No new or acute interval findings. There is no gas in the region of the chronic enterocutaneous fistula no evidence to suggest interval development of an abscess in this region. Fistula not well assessed given the lack of oral contrast on today's study. No evidence for bowel obstruction. No intraperitoneal fluid collection and no intraperitoneal free fluid. Electronically Signed   By: Misty Stanley M.D.   On: 07/13/2015 13:27   Dg Chest 2 View  07/13/2015  CLINICAL DATA:  Emesis.  CHF. EXAM: CHEST  2 VIEW COMPARISON:  06/04/2015 chest radiograph FINDINGS: Right PICC terminates in the lower third of the superior vena cava. Stable cardiomediastinal silhouette with mild cardiomegaly. No pneumothorax. No pleural effusion. Clear lungs, with no focal lung consolidation and no pulmonary edema. IMPRESSION: Stable mild cardiomegaly without pulmonary edema. Lungs appear clear. Electronically Signed   By: Ilona Sorrel M.D.   On: 07/13/2015 12:43   US Abdomen Complete  07/21/2015  CLINICAL DATA:  Elevated liver function studies. EXAM: ULTRASOUND ABDOMEN COMPLETE COMPARISON:  Multiple prior ultrasound and CT examinations. FINDINGS: Gallbladder: Surgically absent Common bile duct: Diameter: 7.1 mm Liver: Normal echogenicity without focal lesion or biliary dilatation. IVC: Normal caliber Pancreas: Poorly visualized but normal on recent CT scans. Spleen: Normal size.  No focal lesions. Right Kidney:  Length: 10.4 cm. Mild renal cortical thinning but no mass or hydronephrosis. Left Kidney: Length: 10.3 cm. Mild renal cortical thinning but no mass or hydronephrosis. Abdominal aorta: Normal caliber Other findings: None. IMPRESSION: Status post cholecystectomy with mild associated common bile duct dilatation. Normal sonographic appearance of the liver. No intrahepatic biliary dilatation. Poor visualization of the pancreas but it was normal on recent CT scans. Electronically Signed   By: Marijo Sanes M.D.   On: 07/21/2015 23:43   US Renal  07/17/2015  CLINICAL DATA:  Renal insufficiency EXAM: RENAL ULTRASOUND COMPARISON:  Renal ultrasound June 08, 2015; CT abdomen and pelvis July 13, 2015 FINDINGS: Right Kidney: Length: 11.1 cm. Echogenicity and renal cortical thickness are within normal limits. No perinephric fluid or hydronephrosis visualized. There is a cyst in the upper pole region measuring  1.5 x 1.5 x 1.5 cm. No sonographically demonstrable calculus or ureterectasis. Left Kidney: Length: 10.8 cm. Echogenicity and renal cortical thickness are within normal limits. No mass, perinephric fluid, or hydronephrosis visualized. No sonographically demonstrable calculus. Bladder: Completely empty and cannot be assessed. IMPRESSION: Cyst arising from upper pole right kidney. Study otherwise unremarkable. Note the urinary bladder is empty and cannot be assessed. Electronically Signed   By: Lowella Grip III M.D.   On: 07/17/2015 11:45   Ir Fluoro Guide Cv Line Right  07/27/2015  INDICATION: 74 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 Patient also has chronic kidney disease. Patient needs IV access for TPN and antibiotics. EXAM: FLUOROSCOPIC AND ULTRASOUND GUIDED PLACEMENT OF A TUNNELED CENTRAL VENOUS CATHETER Physician: Stephan Minister. Henn, MD FLUOROSCOPY TIME:  54 seconds, 23 mGy MEDICATIONS: 1 mg Versed, 25 mcg fentanyl. A radiology nurse monitored the patient for moderate sedation. ANESTHESIA/SEDATION:  Moderate sedation time: 20 minutes PROCEDURE: Informed consent was obtained for placement of a tunneled central venous catheter. The patient was placed supine on the interventional table. Ultrasound confirmed a patent right internal jugularvein. Ultrasound images were obtained for documentation. The right side of the neck was prepped and draped in a sterile fashion. The right side of the neck was anesthetized with 1% lidocaine. Maximal barrier sterile technique was utilized including caps, mask, sterile gowns, sterile gloves, sterile drape, hand hygiene and skin antiseptic. A small incision was made with #11 blade scalpel. A 21 gauge needle directed into the right internal jugular vein with ultrasound guidance. A micropuncture dilator set was placed. A dual lumen Powerline catheter was selected. The skin below the right clavicle was anesthetized and a small incision was made with an #11 blade scalpel. A subcutaneous tunnel was formed to the vein dermatotomy site. The catheter was brought through the tunnel and cut to 22 cm. The vein dermatotomy site was dilated to accommodate a peel-away sheath. The catheter was placed through the peel-away sheath and directed into the central venous structures. The tip of the catheter was placed at the superior cavoatrial junction with fluoroscopy. Fluoroscopic images were obtained for documentation. Both lumens were found to aspirate and flush well. The vein dermatotomy site was closed using a single layer of absorbable suture and Dermabond. The catheter was secured to the skin using Prolene suture. FINDINGS: Catheter tip at the superior cavoatrial junction. Estimated blood loss: Minimal COMPLICATIONS: None IMPRESSION: Successful placement of a right jugular tunneled central venous catheter using ultrasound and fluoroscopic guidance. Electronically Signed   By: Markus Daft M.D.   On: 07/27/2015 17:40   Ir US Guide Vasc Access Right  07/27/2015  INDICATION: 73 year old with  enterocutaneous fistula. Patient also has chronic kidney disease. Patient needs IV access for TPN and antibiotics. EXAM: FLUOROSCOPIC AND ULTRASOUND GUIDED PLACEMENT OF A TUNNELED CENTRAL VENOUS CATHETER Physician: Stephan Minister. Henn, MD FLUOROSCOPY TIME:  54 seconds, 23 mGy MEDICATIONS: 1 mg Versed, 25 mcg fentanyl. A radiology nurse monitored the patient for moderate sedation. ANESTHESIA/SEDATION: Moderate sedation time: 20 minutes PROCEDURE: Informed consent was obtained for placement of a tunneled central venous catheter. The patient was placed supine on the interventional table. Ultrasound confirmed a patent right internal jugularvein. Ultrasound images were obtained for documentation. The right side of the neck was prepped and draped in a sterile fashion. The right side of the neck was anesthetized with 1% lidocaine. Maximal barrier sterile technique was utilized including caps, mask, sterile gowns, sterile gloves, sterile drape, hand hygiene and skin antiseptic. A small incision was made  with #11 blade scalpel. A 21 gauge needle directed into the right internal jugular vein with ultrasound guidance. A micropuncture dilator set was placed. A dual lumen Powerline catheter was selected. The skin below the right clavicle was anesthetized and a small incision was made with an #11 blade scalpel. A subcutaneous tunnel was formed to the vein dermatotomy site. The catheter was brought through the tunnel and cut to 22 cm. The vein dermatotomy site was dilated to accommodate a peel-away sheath. The catheter was placed through the peel-away sheath and directed into the central venous structures. The tip of the catheter was placed at the superior cavoatrial junction with fluoroscopy. Fluoroscopic images were obtained for documentation. Both lumens were found to aspirate and flush well. The vein dermatotomy site was closed using a single layer of absorbable suture and Dermabond. The catheter was secured to the skin using Prolene  suture. FINDINGS: Catheter tip at the superior cavoatrial junction. Estimated blood loss: Minimal COMPLICATIONS: None IMPRESSION: Successful placement of a right jugular tunneled central venous catheter using ultrasound and fluoroscopic guidance. Electronically Signed   By: Markus Daft M.D.   On: 07/27/2015 17:40   Dg Chest Port 1 View  07/25/2015  CLINICAL DATA:  PICC line placement EXAM: PORTABLE CHEST 1 VIEW COMPARISON:  07/13/2015 FINDINGS: Left PICC line tip is at the innominate SVC junction. This could be advanced 5 cm to the SVC RA junction. Normal heart size and vascularity. Minor streaky left base atelectasis. Right lung remains clear. No edema, effusion, or pneumothorax. Trachea midline. Atherosclerosis of the aorta. IMPRESSION: Left PICC line tip at the innominate SVC junction and can be advanced 5 cm to the SVC RA junction. Left base atelectasis Thoracic atherosclerosis Electronically Signed   By: Jerilynn Mages.  Shick M.D.   On: 07/25/2015 12:07   Dg Ugi W/small Bowel  07/20/2015  CLINICAL DATA:  74 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 Prior repair. Question level of fistula. Subsequent encounter. EXAM: UPPER GI SERIES WITH SMALL BOWEL FOLLOW-THROUGH FLUOROSCOPY TIME:  Radiation Exposure Index (as provided by the fluoroscopic device): 1634.12 micro Gy TECHNIQUE: Water-soluble upper GI series followed by small bowel were obtained after discussion with Dr. Kieth Brightly. COMPARISON:  None. FINDINGS: Draining clips are noted on scout view. Patient was only able to ingest a small amount of Omnipaque 300 at one time. Total of 150 cc was ingested. The present examination was specifically tailored to evaluate for level of the enterocutaneous fistula. Gastroesophageal reflux noted. Patient was kept in the upright position to avoid aspiration. Scattered duodenal diverticulum. Nonspecific eggshell calcification right upper quadrant. Enterocutaneous fistulas best seen on lateral overhead imaging. This appears  to arise from the proximal to mid ileum. Distal aspect of the small bowel not visualized. Patient was tired of the exam at this point. Study terminated. IMPRESSION: Enterocutaneous fistulas best seen on lateral overhead imaging. This appears to arise from the proximal to mid ileum. Exam otherwise limited as noted above. Electronically Signed   By: Genia Del M.D.   On: 07/20/2015 15:46    Oren Binet, MD  [facility] Pager:[phone]  If 7PM-7AM, please contact night-coverage [email or web address] Password [email or web address] 07/29/2015, 2:44 PM   LOS: 16 days

## 2015-07-29 NOTE — Progress Notes (Signed)
Pt continuing to refuse cpap for the night, RT informed pt to call for RT if she changes her mind during the night

## 2015-07-29 NOTE — Progress Notes (Signed)
[facility] ROUNDING NOTE   Subjective:   Interval History: resting in bed no complaints this am  Objective:  Vital signs in last 24 hours:  Temp:  [98 F (36.7 C)-99.1 F (37.3 C)] 98.1 F (36.7 C) (11/16 0636) Pulse Rate:  [78-108] 96 (11/16 0636) Resp:  [14-18] 16 (11/16 0636) BP: (90-104)/(33-69) 97/44 mmHg (11/16 0636) SpO2:  [95 %-100 %] 100 % (11/16 0636) Weight:  [109.362 kg (241 lb 1.6 oz)] 109.362 kg (241 lb 1.6 oz) (11/16 0636)  Weight change: -0.318 kg (-11.2 oz) Filed Weights   07/27/15 0608 07/28/15 0530 07/29/15 0636  Weight: 106.142 kg (234 lb) 109.68 kg (241 lb 12.8 oz) 109.362 kg (241 lb 1.6 oz)    Intake/Output: I/O last 3 completed shifts: In: 2751.7 [P.O.:180; I.V.:1261.7; Blood:760] Out: 1100 [Urine:700; Drains:400]   Intake/Output this shift:     Gen: AAOx3, Talking easily.  CV: Intermittent skipped beats, No rubs, no murmurs. Normal S1/S2 Resp: Crackles from the mid fields to the bases bilaterally. Appropriate rate, unlabored.  Ext: WWP, 1-2+ edema, especially dependent edema near her thigh. Pain in LLE as before likely due to gout.  Eakins pouch noted with good seal, and drainage to wall canister. approx 50cc in canister.  Neuro: No asterixis this am.    Basic Metabolic Panel:  Recent Labs Lab 07/23/15 0615 07/24/15 0507 07/25/15 0449 07/26/15 0220 07/28/15 0620 07/29/15 0450  NA 141 142 137 137 140 141  K 3.3* 3.3* 3.5 4.3 3.7 3.7  CL 97* 103 101 103 107 109  CO2 33* 33* 30 27 27 26   GLUCOSE 127* 144* 145* 107* 114* 135*  BUN 54* 42* 43* 48* 32* 29*  CREATININE 2.92* 2.59* 2.61* 2.88* 3.25* 3.37*  CALCIUM 8.5* 8.5* 8.2* 8.2* 8.1* 8.2*  MG 1.6* 1.7 2.0 1.9  --  1.8  PHOS 3.4 3.8  --   --   --  4.3    Liver Function Tests:  Recent Labs Lab 07/23/15 0615 07/29/15 0450  AST 70* 16  ALT 136* 29  ALKPHOS 137* 80  BILITOT 1.0 0.7  PROT 6.0* 5.1*  ALBUMIN 2.2* 1.8*   No results for input(s): LIPASE,  AMYLASE in the last 168 hours. No results for input(s): AMMONIA in the last 168 hours.  CBC:  Recent Labs Lab 07/23/15 0615 07/28/15 0620 07/29/15 0450  WBC 7.9 4.6 4.7  HGB 8.3* 6.8* 8.5*  HCT 27.7* 21.9* 27.4*  MCV 80.5 78.2 79.2  PLT 247 229 219    Cardiac Enzymes: No results for input(s): CKTOTAL, CKMB, CKMBINDEX, TROPONINI in the last 168 hours.  BNP: Invalid input(s): POCBNP  CBG:  Recent Labs Lab 07/26/15 0814 07/26/15 1225 07/29/15 0043 07/29/15 0543 07/29/15 0804  GLUCAP 103* 110* 134* 124* 120*    Microbiology: Results for orders placed or performed during the hospital encounter of 07/13/15  Culture, Urine     Status: None   Collection Time: 07/13/15 12:14 PM  Result Value Ref Range Status   Specimen Description URINE, RANDOM  Final   Special Requests ADDED 2258  Final   Culture >=100,000 COLONIES/mL PROTEUS MIRABILIS  Final   Report Status 07/16/2015 FINAL  Final   Organism ID, Bacteria PROTEUS MIRABILIS  Final      Susceptibility   Proteus mirabilis - MIC*    AMPICILLIN <=2 SENSITIVE Sensitive     CEFAZOLIN <=4 SENSITIVE Sensitive     CEFTRIAXONE <=1 SENSITIVE Sensitive     CIPROFLOXACIN <=0.25 SENSITIVE Sensitive  GENTAMICIN <=1 SENSITIVE Sensitive     IMIPENEM 4 SENSITIVE Sensitive     NITROFURANTOIN 128 RESISTANT Resistant     TRIMETH/SULFA <=20 SENSITIVE Sensitive     AMPICILLIN/SULBACTAM <=2 SENSITIVE Sensitive     PIP/TAZO <=4 SENSITIVE Sensitive     * >=100,000 COLONIES/mL PROTEUS MIRABILIS  Blood culture (routine x 2)     Status: None   Collection Time: 07/13/15  2:50 PM  Result Value Ref Range Status   Specimen Description BLOOD PICC LINE  Final   Special Requests BOTTLES DRAWN AEROBIC AND ANAEROBIC 5ML  Final   Culture  Setup Time   Final    GRAM POSITIVE COCCI IN CLUSTERS IN BOTH AEROBIC AND ANAEROBIC BOTTLES CRITICAL RESULT CALLED TO, READ BACK BY AND VERIFIED WITH: TO BQIU(RN) BY TCLEVELAND 07/14/2015 AT 5:58 AM    Culture  STAPHYLOCOCCUS SPECIES (COAGULASE NEGATIVE)  Final   Report Status 07/16/2015 FINAL  Final   Organism ID, Bacteria STAPHYLOCOCCUS SPECIES (COAGULASE NEGATIVE)  Final      Susceptibility   Staphylococcus species (coagulase negative) - MIC*    CIPROFLOXACIN <=0.5 SENSITIVE Sensitive     ERYTHROMYCIN <=0.25 SENSITIVE Sensitive     GENTAMICIN <=0.5 SENSITIVE Sensitive     OXACILLIN >=4 RESISTANT Resistant     TETRACYCLINE 2 SENSITIVE Sensitive     VANCOMYCIN 2 SENSITIVE Sensitive     TRIMETH/SULFA <=10 SENSITIVE Sensitive     CLINDAMYCIN >=8 RESISTANT Resistant     RIFAMPIN <=0.5 SENSITIVE Sensitive     Inducible Clindamycin NEGATIVE Sensitive     * STAPHYLOCOCCUS SPECIES (COAGULASE NEGATIVE)  Blood culture (routine x 2)     Status: None   Collection Time: 07/13/15  2:55 PM  Result Value Ref Range Status   Specimen Description BLOOD LEFT HAND  Final   Special Requests   Final    BOTTLES DRAWN AEROBIC AND ANAEROBIC BLUE 5CC, RED 3CC   Culture  Setup Time   Final    GRAM POSITIVE COCCI IN CLUSTERS AEROBIC BOTTLE ONLY CRITICAL RESULT CALLED TO, READ BACK BY AND VERIFIED WITH: J NARAMDAS,RN AT 1122 07/14/15 BY L BENFIELD    Culture   Final    STAPHYLOCOCCUS SPECIES (COAGULASE NEGATIVE) SUSCEPTIBILITIES PERFORMED ON PREVIOUS CULTURE WITHIN THE LAST 5 DAYS.    Report Status 07/16/2015 FINAL  Final  Culture, blood (routine x 2)     Status: None   Collection Time: 07/16/15  5:10 AM  Result Value Ref Range Status   Specimen Description BLOOD LEFT HAND  Final   Special Requests IN PEDIATRIC BOTTLE 2.5CC  Final   Culture  Setup Time   Final    GRAM POSITIVE COCCI IN CLUSTERS AEROBIC BOTTLE ONLY CRITICAL RESULT CALLED TO, READ BACK BY AND VERIFIED WITH: MIKKI MURPHY @0541  07/17/15 MKELLY    Culture   Final    STAPHYLOCOCCUS SPECIES (COAGULASE NEGATIVE) SUSCEPTIBILITIES PERFORMED ON PREVIOUS CULTURE WITHIN THE LAST 5 DAYS.    Report Status 07/18/2015 FINAL  Final  Culture, blood (routine  x 2)     Status: None   Collection Time: 07/16/15  5:23 AM  Result Value Ref Range Status   Specimen Description BLOOD LEFT HAND  Final   Special Requests IN PEDIATRIC BOTTLE 2CC  Final   Culture NO GROWTH 5 DAYS  Final   Report Status 07/21/2015 FINAL  Final  Culture, blood (routine x 2)     Status: None   Collection Time: 07/20/15  8:04 PM  Result Value Ref Range Status  Specimen Description BLOOD LEFT HAND  Final   Special Requests IN PEDIATRIC BOTTLE [place]  Final   Culture NO GROWTH 5 DAYS  Final   Report Status 07/25/2015 FINAL  Final  Culture, blood (single)     Status: None   Collection Time: 07/21/15  6:10 AM  Result Value Ref Range Status   Specimen Description BLOOD LEFT HAND  Final   Special Requests IN PEDIATRIC BOTTLE  [place]  Final   Culture NO GROWTH 5 DAYS  Final   Report Status 07/26/2015 FINAL  Final    Coagulation Studies: No results for input(s): LABPROT, INR in the last 72 hours.  Urinalysis: No results for input(s): COLORURINE, LABSPEC, PHURINE, GLUCOSEU, HGBUR, BILIRUBINUR, KETONESUR, PROTEINUR, UROBILINOGEN, NITRITE, LEUKOCYTESUR in the last 72 hours.  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 Patient also has chronic kidney disease. Patient needs IV access for TPN and antibiotics. EXAM: FLUOROSCOPIC AND ULTRASOUND GUIDED PLACEMENT OF A TUNNELED CENTRAL VENOUS CATHETER Physician: Stephan Minister. Henn, MD FLUOROSCOPY TIME:  54 seconds, 23 mGy MEDICATIONS: 1 mg Versed, 25 mcg fentanyl. A radiology nurse monitored the patient for moderate sedation. ANESTHESIA/SEDATION: Moderate sedation time: 20 minutes PROCEDURE: Informed consent was obtained for placement of a tunneled central venous catheter. The patient was placed supine on the interventional table. Ultrasound confirmed a patent right internal jugularvein. Ultrasound images were obtained for documentation. The right side of  the neck was prepped and draped in a sterile fashion. The right side of the neck was anesthetized with 1% lidocaine. Maximal barrier sterile technique was utilized including caps, mask, sterile gowns, sterile gloves, sterile drape, hand hygiene and skin antiseptic. A small incision was made with #11 blade scalpel. A 21 gauge needle directed into the right internal jugular vein with ultrasound guidance. A micropuncture dilator set was placed. A dual lumen Powerline catheter was selected. The skin below the right clavicle was anesthetized and a small incision was made with an #11 blade scalpel. A subcutaneous tunnel was formed to the vein dermatotomy site. The catheter was brought through the tunnel and cut to 22 cm. The vein dermatotomy site was dilated to accommodate a peel-away sheath. The catheter was placed through the peel-away sheath and directed into the central venous structures. The tip of the catheter was placed at the superior cavoatrial junction with fluoroscopy. Fluoroscopic images were obtained for documentation. Both lumens were found to aspirate and flush well. The vein dermatotomy site was closed using a single layer of absorbable suture and Dermabond. The catheter was secured to the skin using Prolene suture. FINDINGS: Catheter tip at the superior cavoatrial junction. Estimated blood loss: Minimal COMPLICATIONS: None IMPRESSION: Successful placement of a right jugular tunneled central venous catheter using ultrasound and fluoroscopic guidance. Electronically Signed   By: Markus Daft M.D.   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 Patient also has chronic kidney disease. Patient needs IV access for TPN and antibiotics. EXAM: FLUOROSCOPIC AND ULTRASOUND GUIDED PLACEMENT OF A TUNNELED CENTRAL VENOUS CATHETER Physician: Stephan Minister. Henn, MD FLUOROSCOPY TIME:  54 seconds, 23 mGy MEDICATIONS: 1 mg Versed, 25 mcg fentanyl. A  radiology nurse monitored the patient for moderate sedation. ANESTHESIA/SEDATION: Moderate sedation time: 20 minutes PROCEDURE: Informed consent was obtained for placement of a tunneled central venous catheter. The patient was placed supine on the interventional table. Ultrasound confirmed a patent  right internal jugularvein. Ultrasound images were obtained for documentation. The right side of the neck was prepped and draped in a sterile fashion. The right side of the neck was anesthetized with 1% lidocaine. Maximal barrier sterile technique was utilized including caps, mask, sterile gowns, sterile gloves, sterile drape, hand hygiene and skin antiseptic. A small incision was made with #11 blade scalpel. A 21 gauge needle directed into the right internal jugular vein with ultrasound guidance. A micropuncture dilator set was placed. A dual lumen Powerline catheter was selected. The skin below the right clavicle was anesthetized and a small incision was made with an #11 blade scalpel. A subcutaneous tunnel was formed to the vein dermatotomy site. The catheter was brought through the tunnel and cut to 22 cm. The vein dermatotomy site was dilated to accommodate a peel-away sheath. The catheter was placed through the peel-away sheath and directed into the central venous structures. The tip of the catheter was placed at the superior cavoatrial junction with fluoroscopy. Fluoroscopic images were obtained for documentation. Both lumens were found to aspirate and flush well. The vein dermatotomy site was closed using a single layer of absorbable suture and Dermabond. The catheter was secured to the skin using Prolene suture. FINDINGS: Catheter tip at the superior cavoatrial junction. Estimated blood loss: Minimal COMPLICATIONS: None IMPRESSION: Successful placement of a right jugular tunneled central venous catheter using ultrasound and fluoroscopic guidance. Electronically Signed   By: Markus Daft M.D.   On: 07/27/2015 17:40      Medications:   . Marland KitchenTPN (CLINIMIX-E) Adult 40 mL/hr at 07/29/15 0600   And  . fat emulsion 240 mL (07/29/15 0600)  . Marland KitchenTPN (CLINIMIX-E) Adult     And  . fat emulsion     . insulin aspart  0-9 Units Subcutaneous 4 times per day  . loperamide  2 mg Oral 4 times per day  . nystatin  5 mL Oral QID  . octreotide  100 mcg Subcutaneous Q12H  . 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   1. AoCKD: Initially with profound azotemia due to dehydration / hypovolemia with Indomethacin as the etiology. , Weight steadily increasing, hard to say how accurately. She is volume up on exam with hypoalbuminemia / malnutrition. Likely low oncotic pressure. Has not received contrast. Has been on Vancomycin.  creatinine slightly increased today 1. IV Fluid stopped  2.  Vancomycin stopped 3.  4. Renal US 11/4 w/o obstruction  5. Continue TPN 6. Not a dialysis candidate. BUN improving, Cr rising. 7. Daily weights, Daily Renal Panel, Strict I/Os, Avoid nephrotoxins (NSAIDs, judicious IV Contrast). 1. Chrnoic enterocutaneous fistula, intermittent high outpt, TPN dependency -- surgery following. Supposed to restart TPN today.  2. NAG metabolic acidosis: Resolved.  3. AMS: Back to baseline. Uremia resolved.  4. CNS bacteremia and Proteus UTI 1. Negative Cx to date. Replaced PICC and then IR placed central line.  2. Vancomycin was discontinued 3. Abx per primary team:  5. FTT -- goals of care / palliative care seem reasonable to explore at this time   LOS: 16 Marissa [name]  W @TODAY @11 :55 AM

## 2015-07-29 NOTE — Consult Note (Signed)
WOC wound follow up Current pouch is leaking behind the barrier to lower abd area, was applied yesterday afternoon. Wound type: Full thickness wound to midline abd with fistula, has decreased in size since admission.  Wound bed: 100% red Drainage (amount, consistency, odor) Mod amt green drainage in cannister Periwound: Intact skin surrounding wound is very moist and weeping from unknown etiology despite drying and crusting attempts. It is difficult to maintain a pouch seal related to constant moisture. Dressing procedure/placement/frequency: Applied skin prep to protect skin, then large Eakin pouch with red rubber catheter to low wall suction to control drainage and promote healing. [place] team will continue to follow. Supplies at bedside and instructions provided for staff nurses if leakage occurs. Julien Girt MSN, RN, [place], [place], [place]

## 2015-07-30 DIAGNOSIS — I5041 Acute combined systolic (congestive) and diastolic (congestive) heart failure: Secondary | ICD-10-CM

## 2015-07-30 LAB — GLUCOSE, CAPILLARY
GLUCOSE-CAPILLARY: 151 mg/dL — AB (ref 65–99)
GLUCOSE-CAPILLARY: 119 mg/dL — AB (ref 65–99)
GLUCOSE-CAPILLARY: 163 mg/dL — AB (ref 65–99)
Glucose-Capillary: 148 mg/dL — ABNORMAL HIGH (ref 65–99)
Glucose-Capillary: 150 mg/dL — ABNORMAL HIGH (ref 65–99)
Glucose-Capillary: 157 mg/dL — ABNORMAL HIGH (ref 65–99)

## 2015-07-30 LAB — CBC
HEMATOCRIT: 27.9 % — AB (ref 36.0–46.0)
HEMOGLOBIN: 8.8 g/dL — AB (ref 12.0–15.0)
MCH: 25.1 pg — ABNORMAL LOW (ref 26.0–34.0)
MCHC: 31.5 g/dL (ref 30.0–36.0)
MCV: 79.7 fL (ref 78.0–100.0)
Platelets: 218 10*3/uL (ref 150–400)
RBC: 3.5 MIL/uL — ABNORMAL LOW (ref 3.87–5.11)
RDW: 15.2 % (ref 11.5–15.5)
WBC: 5.6 10*3/uL (ref 4.0–10.5)

## 2015-07-30 LAB — COMPREHENSIVE METABOLIC PANEL
ALBUMIN: 1.9 g/dL — AB (ref 3.5–5.0)
ALK PHOS: 83 U/L (ref 38–126)
ALT: 25 U/L (ref 14–54)
AST: 17 U/L (ref 15–41)
Anion gap: 7 (ref 5–15)
BILIRUBIN TOTAL: 0.4 mg/dL (ref 0.3–1.2)
BUN: 37 mg/dL — AB (ref 6–20)
CALCIUM: 8.5 mg/dL — AB (ref 8.9–10.3)
CO2: 26 mmol/L (ref 22–32)
Chloride: 106 mmol/L (ref 101–111)
Creatinine, Ser: 3.32 mg/dL — ABNORMAL HIGH (ref 0.44–1.00)
GFR calc Af Amer: 15 mL/min — ABNORMAL LOW (ref >60)
GFR, EST NON AFRICAN AMERICAN: 13 mL/min — AB (ref >60)
GLUCOSE: 146 mg/dL — AB (ref 65–99)
Potassium: 3.7 mmol/L (ref 3.5–5.1)
Sodium: 139 mmol/L (ref 135–145)
TOTAL PROTEIN: 5.6 g/dL — AB (ref 6.5–8.1)

## 2015-07-30 LAB — MAGNESIUM: MAGNESIUM: 1.7 mg/dL (ref 1.7–2.4)

## 2015-07-30 LAB — PHOSPHORUS: Phosphorus: 5.2 mg/dL — ABNORMAL HIGH (ref 2.5–4.6)

## 2015-07-30 MED ORDER — WHITE PETROLATUM GEL
Status: AC
  Administered 2015-07-30: 02:00:00
  Filled 2015-07-30: qty 1

## 2015-07-30 MED ORDER — FAT EMULSION 20 % IV EMUL
234.0000 mL | INTRAVENOUS | Status: AC
  Administered 2015-07-30: 234 mL via INTRAVENOUS
  Filled 2015-07-30 (×2): qty 250

## 2015-07-30 MED ORDER — DIPHENHYDRAMINE HCL 12.5 MG/5ML PO ELIX
25.0000 mg | ORAL_SOLUTION | Freq: Four times a day (QID) | ORAL | Status: DC | PRN
  Administered 2015-08-01 – 2015-08-02 (×4): 25 mg via ORAL
  Filled 2015-07-30 (×5): qty 10

## 2015-07-30 MED ORDER — TRACE MINERALS CR-CU-MN-SE-ZN 10-1000-500-60 MCG/ML IV SOLN
INTRAVENOUS | Status: DC
  Administered 2015-07-30: 18:00:00 via INTRAVENOUS
  Filled 2015-07-30 (×2): qty 1992

## 2015-07-30 MED ORDER — MAGNESIUM SULFATE IN D5W 10-5 MG/ML-% IV SOLN
1.0000 g | Freq: Once | INTRAVENOUS | Status: AC
  Administered 2015-07-30: 1 g via INTRAVENOUS
  Filled 2015-07-30: qty 100

## 2015-07-30 MED ORDER — INSULIN ASPART 100 UNIT/ML ~~LOC~~ SOLN
0.0000 [IU] | SUBCUTANEOUS | Status: DC
  Administered 2015-07-30 – 2015-07-31 (×3): 2 [IU] via SUBCUTANEOUS
  Administered 2015-07-31: 1 [IU] via SUBCUTANEOUS
  Administered 2015-07-31: 2 [IU] via SUBCUTANEOUS
  Administered 2015-08-01: 1 [IU] via SUBCUTANEOUS
  Administered 2015-08-01: 2 [IU] via SUBCUTANEOUS

## 2015-07-30 MED ORDER — OXYCODONE HCL 5 MG PO TABS
10.0000 mg | ORAL_TABLET | Freq: Four times a day (QID) | ORAL | Status: DC | PRN
  Administered 2015-07-30 – 2015-08-01 (×2): 10 mg via ORAL
  Filled 2015-07-30 (×3): qty 2

## 2015-07-30 NOTE — Consult Note (Signed)
WOC wound follow up Current pouch is leaking behind the barrier to lower abd area, was applied yesterday. Wound type: Full thickness wound to midline abd with fistula, has decreased in size since admission.  Wound bed: 100% red Drainage (amount, consistency, odor) Mod amt green drainage in cannister Periwound: Intact skin surrounding wound is very moist and weeping from unknown etiology despite drying and crusting attempts. It is difficult to maintain a pouch seal related to constant moisture. Dressing procedure/placement/frequency: Applied skin prep to protect skin, then large Eakin pouch with red rubber catheter to low wall suction to control drainage and promote healing. [place] team will continue to follow. Supplies at bedside and instructions provided for staff nurses if leakage occurs. Julien Girt MSN, RN, [place], [place], [place]

## 2015-07-30 NOTE — Progress Notes (Signed)
Physical Therapy Treatment Patient Details Name: Marissa [name] MRN: [id number] DOB: 1942-03-31Today's Date: 07/30/2015    History of Present Illness 74 yo female admitted 10/31 from SNF for dehydration, lethargy, renal failure, UTI and decreased PT intake after TPN stopped.  Pt with significant PMH of CKD, obstructive sleep apnea, DMII, HTN, hidradenitis suppurativa, diastolic heart failure, [id number] abdominal wound explorationof enterocutaneous fistula and absecess.  Pt currently with abdominal pouch/wound connected to wall suction.      PT Comments    Re evaluation complete.  Goals reviewed and updated.  Pt continues to be difficult to convince to participate fully with PT.  She feels she is not allowed to work at her own pace and all PT wants is for her to get to the chair.  She reports she is not ready to get to the chair and would prefer not to be pushed to do that until she is ready.  She shows significant signs of fear of falling and feelings of loss of control of her own life and decisions while here in the hospital.  Significant time spent letting pt vent her frustration and letting her guide the session.  PT will continue to follow acutely and continue to believe SNF level rehab at discharge is safest and most appropriate.    Follow Up Recommendations  SNF     Equipment Recommendations  None recommended by PT    Recommendations for Other Services   NA     Precautions / Restrictions Precautions Precautions: Fall Precaution Comments: wound pouch in place mid-stomach region connected to wall suction.     Mobility  Bed Mobility Overal bed mobility: Needs Assistance Bed Mobility: Rolling;Sidelying to Sit Rolling: Min guard Sidelying to sit: Min guard     Sit to sidelying: +2 for physical assistance;Mod assist General bed mobility comments: Min guard assist to roll to the left using bed rail and very minimal assist from PT at hips, min guard assist during transition to sit at  trunk.  Most assist needed when going back to sidelying, pt needed assist at trunk and to pick up bil legs to get them back into bed.  This required two people to be safe.   Transfers Overall transfer level: Needs assistance Equipment used: 2 person hand held assist Transfers: Sit to/from Stand Sit to Stand: +2 physical assistance;Mod assist         General transfer comment: Two person mod assist to support trunk to get to standing x 2  bed pad used to help support hips.  Pt refusing to transfer OOB to chair partially due to fear of falling and partially due to not wanting to be left in the chair.   Ambulation/Gait             General Gait Details: unable at this time          Balance Overall balance assessment: Needs assistance Sitting-balance support: Feet supported;No upper extremity supported Sitting balance-Leahy Scale: Fair Sitting balance - Comments: Pt able to sit EOB and complete some leg exercises.     Standing balance support: Bilateral upper extremity supported Standing balance-Leahy Scale: Poor Standing balance comment: unable to stand unassisted, pt tried a few time and ultimately needed help to stand.  She is very fearful of falling and wants to try on her own first as she feels as if we are pulling on her too much and "just want to throw me in that chair"  Pt stood <30 seconds each time.  Cognition Arousal/Alertness: Awake/alert Behavior During Therapy: WFL for tasks assessed/performed Overall Cognitive Status: No family/caregiver present to determine baseline cognitive functioning (pt has illogical reasoning)                      Exercises General Exercises - Upper Extremity Shoulder Flexion: AAROM;Both;10 reps;Supine Elbow Flexion: AAROM;Both;Supine;10 reps General Exercises - Lower Extremity Ankle Circles/Pumps: AAROM;Both;10 reps;Supine Heel Slides: AAROM;Both;10 reps;Supine Hip ABduction/ADduction: AAROM;Both;10  reps;Supine Other Exercises Other Exercises: In bed exercises preformed as "warm up"        Pertinent Vitals/Pain Pain Assessment: Faces Faces Pain Scale: Hurts even more Pain Location: with movement of bil feet, stomach "burning" Pain Descriptors / Indicators: Grimacing;Guarding Pain Intervention(s): Limited activity within patient's tolerance;Monitored during session;Repositioned           PT Goals (current goals can now be found in the care plan section) Acute Rehab PT Goals Patient Stated Goal: to go home PT Goal Formulation: With patient Time For Goal Achievement: 08/13/15 Potential to Achieve Goals: Fair Progress towards PT goals: Not progressing toward goals - comment (due to pt refusal of OOB to chair. )    Frequency  Min 2X/week    PT Plan Current plan remains appropriate       End of Session   Activity Tolerance: Patient limited by pain;Patient limited by fatigue Patient left: in bed;with call bell/phone within reach     Time: 1529-1619 PT Time Calculation (min) (ACUTE ONLY): 50 min  Charges:  $Therapeutic Exercise: 8-22 mins $Therapeutic Activity: 8-22 mins                     Guillermina Shaft B. Field Staniszewski, [place], DPT [phone]   07/30/2015, 6:30 PM

## 2015-07-30 NOTE — Progress Notes (Signed)
Pt continuing to refuse nightly cpap, RT will continue to monitor

## 2015-07-30 NOTE — Care Management Important Message (Signed)
Important Message  Patient Details  Name: Vivi Fassler MRN: [id number] Date of Birth: 11/02/1940   Medicare Important Message Given:  Yes    Adhira Jamil P Raffael Bugarin 07/30/2015, 12:22 PM

## 2015-07-30 NOTE — Progress Notes (Signed)
Patient ID: Marissa [name], female   DOB: [birth year], 74 y.o.   MRN: [id number]     [facility]      [address] [place], [place] [address]    Phone: [phone] FAX: [phone]     Subjective: Pt very feisty today. Declined PT on Monday, no note from yesterday. Worked with OT, 2+ person assist to pivot. Not getting OOB. C/o abdominal pain and that she's not getting pain meds  Objective:  Vital signs:  Filed Vitals:   07/29/15 0636 07/29/15 1418 07/29/15 2100 07/30/15 0648  BP: 97/44 103/55 94/45 104/43  Pulse: 96 102 100 99  Temp: 98.1 F (36.7 C) 98 F (36.7 C) 98.8 F (37.1 C) 98.5 F (36.9 C)  TempSrc: Oral Oral Oral Oral  Resp: _0 Height:      Weight: 109.362 kg (241 lb 1.6 oz)     SpO2: 100% 99% 100% 97%    Last BM Date: 07/26/15  Intake/Output   Yesterday:  11/16 0701 - 11/17 0700 In: 628.8 [TPN:628.8] Out: 600 [Urine:400; Drains:200] This shift:  Physical Exam: General: Pt awake/alert/oriented x4 in no acute distress  Abdomen: Soft. Nondistended. eakin's pouch in place with red rubber catheter to suction. Non tender.    Problem List:   Principal Problem:   Acute on chronic renal failure (HCC) Active Problems:   OSA (obstructive sleep apnea)   Essential hypertension   Chronic kidney disease, stage 3   Chronic diastolic CHF (congestive heart failure) (HCC)   Enterocutaneous fistula   Type 2 diabetes mellitus without complication (HCC)   Hyponatremia   Dehydration   Metabolic acidosis   Urinary tract infection   Oral thrush   Acute kidney injury ([place])    Results:   Labs: Results for orders placed or performed during the hospital encounter of 07/13/15 (from the past 48 hour(s))  Type and screen     Status: None   Collection Time: 07/28/15  8:50 AM  Result Value Ref Range   ABO/RH(D) B POS    Antibody Screen NEG    Sample Expiration 07/31/2015    Unit Number [id number]     Blood Component Type RED CELLS,LR    Unit division 00    Status of Unit ISSUED,FINAL    Transfusion Status OK TO TRANSFUSE    Crossmatch Result Compatible    Unit Number [id number]    Blood Component Type RED CELLS,LR    Unit division 00    Status of Unit ISSUED,FINAL    Transfusion Status OK TO TRANSFUSE    Crossmatch Result Compatible   Prepare RBC     Status: None   Collection Time: 07/28/15  8:50 AM  Result Value Ref Range   Order Confirmation ORDER PROCESSED BY BLOOD BANK   Glucose, capillary     Status: Abnormal   Collection Time: 07/29/15 12:43 AM  Result Value Ref Range   Glucose-Capillary 134 (H) 65 - 99 mg/dL  CBC     Status: Abnormal   Collection Time: 07/29/15  4:50 AM  Result Value Ref Range   WBC 4.7 4.0 - 10.5 K/uL   RBC 3.46 (L) 3.87 - 5.11 MIL/uL   Hemoglobin 8.5 (L) 12.0 - 15.0 g/dL   HCT 27.4 (L) 36.0 - 46.0 %   MCV 79.2 78.0 - 100.0 fL   MCH 24.6 (L) 26.0 - 34.0 pg   MCHC 31.0 30.0 - 36.0 g/dL   RDW 14.7 11.5 -  15.5 %   Platelets 219 150 - 400 K/uL  Comprehensive metabolic panel     Status: Abnormal   Collection Time: 07/29/15  4:50 AM  Result Value Ref Range   Sodium 141 135 - 145 mmol/L   Potassium 3.7 3.5 - 5.1 mmol/L   Chloride 109 101 - 111 mmol/L   CO2 26 22 - 32 mmol/L   Glucose, Bld 135 (H) 65 - 99 mg/dL   BUN 29 (H) 6 - 20 mg/dL   Creatinine, Ser 3.37 (H) 0.44 - 1.00 mg/dL   Calcium 8.2 (L) 8.9 - 10.3 mg/dL   Total Protein 5.1 (L) 6.5 - 8.1 g/dL   Albumin 1.8 (L) 3.5 - 5.0 g/dL   AST 16 15 - 41 U/L   ALT 29 14 - 54 U/L   Alkaline Phosphatase 80 38 - 126 U/L   Total Bilirubin 0.7 0.3 - 1.2 mg/dL   GFR calc non Af Amer 12 (L) >60 mL/min   GFR calc Af Amer 14 (L) >60 mL/min    Comment: (NOTE) The eGFR has been calculated using the CKD EPI equation. This calculation has not been validated in all clinical situations. eGFR's persistently <60 mL/min signify possible Chronic Kidney Disease.    Anion gap 6 5 - 15  Magnesium      Status: None   Collection Time: 07/29/15  4:50 AM  Result Value Ref Range   Magnesium 1.8 1.7 - 2.4 mg/dL  Phosphorus     Status: None   Collection Time: 07/29/15  4:50 AM  Result Value Ref Range   Phosphorus 4.3 2.5 - 4.6 mg/dL  Glucose, capillary     Status: Abnormal   Collection Time: 07/29/15  5:43 AM  Result Value Ref Range   Glucose-Capillary 124 (H) 65 - 99 mg/dL  Glucose, capillary     Status: Abnormal   Collection Time: 07/29/15  8:04 AM  Result Value Ref Range   Glucose-Capillary 120 (H) 65 - 99 mg/dL  Glucose, capillary     Status: Abnormal   Collection Time: 07/29/15  1:10 PM  Result Value Ref Range   Glucose-Capillary 116 (H) 65 - 99 mg/dL  Glucose, capillary     Status: Abnormal   Collection Time: 07/29/15  4:36 PM  Result Value Ref Range   Glucose-Capillary 127 (H) 65 - 99 mg/dL  Urinalysis, Routine w reflex microscopic (not at [facility])     Status: Abnormal   Collection Time: 07/29/15  9:56 PM  Result Value Ref Range   Color, Urine YELLOW YELLOW   APPearance CLOUDY (A) CLEAR   Specific Gravity, Urine 1.007 1.005 - 1.030   pH 5.0 5.0 - 8.0   Glucose, UA NEGATIVE NEGATIVE mg/dL   Hgb urine dipstick TRACE (A) NEGATIVE   Bilirubin Urine NEGATIVE NEGATIVE   Ketones, ur NEGATIVE NEGATIVE mg/dL   Protein, ur NEGATIVE NEGATIVE mg/dL   Nitrite NEGATIVE NEGATIVE   Leukocytes, UA SMALL (A) NEGATIVE  Urine microscopic-add on     Status: Abnormal   Collection Time: 07/29/15  9:56 PM  Result Value Ref Range   Squamous Epithelial / LPF 6-30 (A) NONE SEEN    Comment: Please note change in reference range.   WBC, UA 0-5 0 - 5 WBC/hpf    Comment: Please note change in reference range.   RBC / HPF 0-5 0 - 5 RBC/hpf    Comment: Please note change in reference range.   Bacteria, UA RARE (A) NONE SEEN    Comment:  Please note change in reference range.  Glucose, capillary     Status: Abnormal   Collection Time: 07/30/15 12:03 AM  Result Value Ref Range   Glucose-Capillary 148  (H) 65 - 99 mg/dL  CBC     Status: Abnormal   Collection Time: 07/30/15  6:03 AM  Result Value Ref Range   WBC 5.6 4.0 - 10.5 K/uL   RBC 3.50 (L) 3.87 - 5.11 MIL/uL   Hemoglobin 8.8 (L) 12.0 - 15.0 g/dL   HCT 27.9 (L) 36.0 - 46.0 %   MCV 79.7 78.0 - 100.0 fL   MCH 25.1 (L) 26.0 - 34.0 pg   MCHC 31.5 30.0 - 36.0 g/dL   RDW 15.2 11.5 - 15.5 %   Platelets 218 150 - 400 K/uL  Comprehensive metabolic panel     Status: Abnormal   Collection Time: 07/30/15  6:03 AM  Result Value Ref Range   Sodium 139 135 - 145 mmol/L   Potassium 3.7 3.5 - 5.1 mmol/L   Chloride 106 101 - 111 mmol/L   CO2 26 22 - 32 mmol/L   Glucose, Bld 146 (H) 65 - 99 mg/dL   BUN 37 (H) 6 - 20 mg/dL   Creatinine, Ser 3.32 (H) 0.44 - 1.00 mg/dL   Calcium 8.5 (L) 8.9 - 10.3 mg/dL   Total Protein 5.6 (L) 6.5 - 8.1 g/dL   Albumin 1.9 (L) 3.5 - 5.0 g/dL   AST 17 15 - 41 U/L   ALT 25 14 - 54 U/L   Alkaline Phosphatase 83 38 - 126 U/L   Total Bilirubin 0.4 0.3 - 1.2 mg/dL   GFR calc non Af Amer 13 (L) >60 mL/min   GFR calc Af Amer 15 (L) >60 mL/min    Comment: (NOTE) The eGFR has been calculated using the CKD EPI equation. This calculation has not been validated in all clinical situations. eGFR's persistently <60 mL/min signify possible Chronic Kidney Disease.    Anion gap 7 5 - 15  Magnesium     Status: None   Collection Time: 07/30/15  6:03 AM  Result Value Ref Range   Magnesium 1.7 1.7 - 2.4 mg/dL  Phosphorus     Status: Abnormal   Collection Time: 07/30/15  6:03 AM  Result Value Ref Range   Phosphorus 5.2 (H) 2.5 - 4.6 mg/dL  Glucose, capillary     Status: Abnormal   Collection Time: 07/30/15  6:24 AM  Result Value Ref Range   Glucose-Capillary 150 (H) 65 - 99 mg/dL  Glucose, capillary     Status: Abnormal   Collection Time: 07/30/15  7:41 AM  Result Value Ref Range   Glucose-Capillary 157 (H) 65 - 99 mg/dL    Imaging / Studies: No results found.  Medications / Allergies:  Scheduled Meds: .  furosemide  40 mg Intravenous Q12H  . heparin subcutaneous  5,000 Units Subcutaneous 3 times per day  . insulin aspart  0-9 Units Subcutaneous 4 times per day  . loperamide  2 mg Oral 4 times per day  . magnesium sulfate 1 - 4 g bolus IVPB  1 g Intravenous Once  . nystatin  5 mL Oral QID  . octreotide  100 mcg Subcutaneous Q12H  . pantoprazole (PROTONIX) IV  40 mg Intravenous Q24H   Continuous Infusions: . Marland KitchenTPN (CLINIMIX-E) Adult     And  . fat emulsion    . Marland KitchenTPN (CLINIMIX-E) Adult 83 mL/hr at 07/29/15 1720   And  . fat  emulsion 240 mL (07/29/15 1720)   PRN Meds:.diphenhydrAMINE, diphenhydrAMINE-zinc acetate, Glycerin (Adult), hydrocerin, hydrOXYzine, iohexol, morphine injection, nitroGLYCERIN, ondansetron **OR** ondansetron (ZOFRAN) IV, oxyCODONE, sodium chloride, sodium chloride  Antibiotics: Anti-infectives    Start     Dose/Rate Route Frequency Ordered Stop   07/27/15 1557  ceFAZolin (ANCEF) 2-3 GM-% IVPB SOLR  Status:  Discontinued    Comments:  [name]   : cabinet override      07/27/15 1557 07/27/15 1619   07/23/15 1000  vancomycin (VANCOCIN) 1,250 mg in sodium chloride 0.9 % 250 mL IVPB  Status:  Discontinued     1,250 mg 166.7 mL/hr over 90 Minutes Intravenous Every 48 hours 07/23/15 0908 07/28/15 1353   07/16/15 1000  vancomycin (VANCOCIN) 1,500 mg in sodium chloride 0.9 % 500 mL IVPB  Status:  Discontinued     1,500 mg 250 mL/hr over 120 Minutes Intravenous Every 48 hours 07/14/15 0956 07/15/15 1035   07/16/15 1000  vancomycin (VANCOCIN) IVPB 1000 mg/200 mL premix  Status:  Discontinued     1,000 mg 200 mL/hr over 60 Minutes Intravenous Every 48 hours 07/15/15 1035 07/20/15 1242   07/14/15 1500  cefTRIAXone (ROCEPHIN) 1 g in dextrose 5 % 50 mL IVPB  Status:  Discontinued     1 g 100 mL/hr over 30 Minutes Intravenous Every 24 hours 07/13/15 1647 07/17/15 1113   07/14/15 1015  vancomycin (VANCOCIN) 1,500 mg in sodium chloride 0.9 % 500 mL IVPB     1,500 mg 250  mL/hr over 120 Minutes Intravenous NOW 07/14/15 0943 07/14/15 1210   07/13/15 1400  cefTRIAXone (ROCEPHIN) 1 g in dextrose 5 % 50 mL IVPB     1 g 100 mL/hr over 30 Minutes Intravenous  Once 07/13/15 1359 07/13/15 1526       Assessment/Plan ECF-eakin's pouch, appreciate WOC management. -accurate I&Os -sandostatin/loperamide to slow down transit -cont strict NPO,  280m out yesterday -SBFT did show EC fistula to be fairly distal in the proximal to mid ileum -pt is also very deconditioned, needs to be optimized nutritionally and physically before any surgical consideration.  -cont TNA FEN-NPO. Previous PO trials have resulted in increased ECF output, dehydration, renal failure and readmission  PCM- prealbumin is 11.9. TNA restarted  Acute on chronic renal failure- per medicine VTE prophylaxis-SCD/heparin  Dispo-palliative care to meet with the family.  Spoke with daughter, agreeable with SNF if she needs suction for the ECF output.  Otherwise wants to take her home.  EErby Pian [facility] Pager [phone] For consults and floor pages call [phone]([id number])  07/30/2015 8:41 AM

## 2015-07-30 NOTE — Progress Notes (Signed)
PATIENT DETAILS Name: Marissa [name] Age: 74 y.o. Sex: female Date of Birth: 01-15-1941 Admit Date: 07/13/2015 Admitting Physician Waldemar Dickens, MD SS:[id number] Ronnald Ramp, MD  Brief narrative: Patient is a 74 year old female with known history of enterocutaneous fistula-who was recently admitted on 9/22 for increased ostomy output with encephalopathy. She was treated with TNA/Sandostatin-a eakin pouch was placed, she was subsequently discharged to SNF on 10/4. Patient was readmitted on 10/31, after increased biliary drainage from her fistula along with biliary emesis. Patient was found to have acute on chronic kidney disease, coagulase negative Staphylococcus bacteremia. General surgery/infectious disease has been following patient. Her prior PICC line has been discontinued, and a Port-A-Cath has been placed. Unfortunately, hospital course has now been complicated by redevelopment of acute renal failure, along with severe failure to thrive syndrome. Plans are for definite surgical repair of enterocutaneous fistula once patient is nutritionally and physically ready. Currently remains nothing by mouth on TNA.  Subjective: Appears weak and deconditioned-complains of itching-upset that she is getting weaker and is very frustrated with her overall state of health.  Assessment/Plan: Principal Problem: Acute on chronic renal failure stage III: Felt to have acute renal failure secondary to prerenal azotemia from dehydration/high ostomy output which initially improved, but recurred-thought to be secondary to vancomycin. Creatinine seems to have stabilized/plateaued. Nephrology following and directing care. Per nephrology, patient not a long-term candidate for hemodialysis.  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 Continue Sandostatin and loperamide. Remains   NPO-TNA-200 ostomy output yesterday. Per general surgery-currently a poor surgical candidate for repair of enterocutaneous fistula-needs to be optimized both nutritionally and physically before proceeding with surgery.  Sepsis secondary to coagulase negative Staphylococcus bacteremia: Likely catheter-related bacteremia-likely present on admission.PICC line has been discontinued.Port placed by IR on 11/14. Although recommendations were to stop vancomycin on 11/20-given worsening renal function-vancomycin was discontinued on 11/15. Dr. Tana Coast spoke with infectious disease M.D.-Dr Linus Salmons who recommended to observe off antibiotics. Remains afebrile and without leukocytosis. Note blood cultures on 11/7 and 11/8 negative so far.   Anemia: Secondary to acute on chronic illness/CKD- transfuse 2 units of PRBC on 11/15-follow hemoglobin. No overt evidence of any blood loss.  Proteus [id number], completed 5 days of empiric Rocephin.  Transaminitis: Resolved. Likely secondary to sepsis/shock liver. History of cholecystectomy. Abdominal ultrasound on 11/8 did not show any major intrahepatic/extrahepatic findings.  Acute encephalopathy: Likely secondary to sepsis-resolved. Although very deconditioned/weak-is awake and alert.  Acute on Chronic diastolic heart failure: Seems to have developed volume overload-currently on IV Lasix. Follow closely.   Oral thrush: Continue nystatin  Hyponatremia: Resolved.  Severe malnutrition with failure to thrive:on TPN-PT/OT following. Patient remains very frustrated about her overall health. This M.D. Spoke with Daughter-Lillie Froning on 11/16-who understands poor overall prognoses-she wants to consider some palliative measures-while patient attempts to recuperate from numerous illness-have consulted palliative care-await input.  OSA (obstructive sleep apnea): continue CPAP  Disposition: Remain inpatient-await palliative care evaluation to determine  disposition.  Antimicrobial agents  See below  Anti-infectives    Start     Dose/Rate Route Frequency Ordered Stop   07/27/15 1557  ceFAZolin (ANCEF) 2-3 GM-% IVPB SOLR  Status:  Discontinued    Comments:  [name]   : cabinet override      07/27/15 1557 07/27/15 1619   07/23/15 1000  vancomycin (VANCOCIN) 1,250 mg  in sodium chloride 0.9 % 250 mL IVPB  Status:  Discontinued     1,250 mg 166.7 mL/hr over 90 Minutes Intravenous Every 48 hours 07/23/15 0908 07/28/15 1353   07/16/15 1000  vancomycin (VANCOCIN) 1,500 mg in sodium chloride 0.9 % 500 mL IVPB  Status:  Discontinued     1,500 mg 250 mL/hr over 120 Minutes Intravenous Every 48 hours 07/14/15 0956 07/15/15 1035   07/16/15 1000  vancomycin (VANCOCIN) IVPB 1000 mg/200 mL premix  Status:  Discontinued     1,000 mg 200 mL/hr over 60 Minutes Intravenous Every 48 hours 07/15/15 1035 07/20/15 1242   07/14/15 1500  cefTRIAXone (ROCEPHIN) 1 g in dextrose 5 % 50 mL IVPB  Status:  Discontinued     1 g 100 mL/hr over 30 Minutes Intravenous Every 24 hours 07/13/15 1647 07/17/15 1113   07/14/15 1015  vancomycin (VANCOCIN) 1,500 mg in sodium chloride 0.9 % 500 mL IVPB     1,500 mg 250 mL/hr over 120 Minutes Intravenous NOW 07/14/15 0943 07/14/15 1210   07/13/15 1400  cefTRIAXone (ROCEPHIN) 1 g in dextrose 5 % 50 mL IVPB     1 g 100 mL/hr over 30 Minutes Intravenous  Once 07/13/15 1359 07/13/15 1526      DVT Prophylaxis:  Prophylactic Heparin  Code Status:  DNR  Family Communication [name] Priola-Daughter over the phone 11/16-none at bedside today  Procedures: IR placed tunneled right jugular central line on 11/14  CONSULTS:  general surgery   Renal  Time spent 40 minutes-Greater than 50% of this time was spent in counseling, explanation of diagnosis, planning of further management, and coordination of care.  MEDICATIONS: Scheduled Meds: . furosemide  40 mg Intravenous Q12H  . heparin subcutaneous  5,000 Units  Subcutaneous 3 times per day  . insulin aspart  0-9 Units Subcutaneous 4 times per day  . loperamide  2 mg Oral 4 times per day  . nystatin  5 mL Oral QID  . octreotide  100 mcg Subcutaneous Q12H  . pantoprazole (PROTONIX) IV  40 mg Intravenous Q24H   Continuous Infusions: . Marland KitchenTPN (CLINIMIX-E) Adult     And  . fat emulsion    . Marland KitchenTPN (CLINIMIX-E) Adult 83 mL/hr at 07/29/15 1720   And  . fat emulsion 240 mL (07/29/15 1720)   PRN Meds:.diphenhydrAMINE, diphenhydrAMINE-zinc acetate, Glycerin (Adult), hydrocerin, hydrOXYzine, iohexol, morphine injection, nitroGLYCERIN, ondansetron **OR** ondansetron (ZOFRAN) IV, oxyCODONE, sodium chloride, sodium chloride    PHYSICAL EXAM: Vital signs in last 24 hours: Filed Vitals:   07/29/15 1418 07/29/15 2100 07/30/15 0648 07/30/15 1100  BP: 103/55 94/45 104/43 117/55  Pulse: 102 100 99 97  Temp: 98 F (36.7 C) 98.8 F (37.1 C) 98.5 F (36.9 C) 98.4 F (36.9 C)  TempSrc: Oral Oral Oral Oral  Resp: 18 16 16 15   Height:      Weight:      SpO2: 99% 100% 97%     Weight change:  Filed Weights   07/27/15 0608 07/28/15 0530 07/29/15 0636  Weight: 106.142 kg (234 lb) 109.68 kg (241 lb 12.8 oz) 109.362 kg (241 lb 1.6 oz)   Body mass index is 41.36 kg/(m^2).   Gen Exam: Awake and alert with clear but slow speech. Significantly deconditioned and weak. Neck: Supple, Chest: B/L Clear.   CVS: S1 S2 Regular, no murmurs.  Abdomen: Soft, midline wound, eakin pouch to wall suction Extremities: + edema, lower extremities warm to touch. Neurologic: Non Focal.  Intake/Output from previous day:  Intake/Output Summary (Last 24 hours) at 07/30/15 1144 Last data filed at 07/30/15 0515  Gross per 24 hour  Intake 628.83 ml  Output    600 ml  Net  28.83 ml     LAB RESULTS: CBC  Recent Labs Lab 07/28/15 0620 07/29/15 0450 07/30/15 0603  WBC 4.6 4.7 5.6  HGB 6.8* 8.5* 8.8*  HCT 21.9* 27.4* 27.9*  PLT 229 219 218  MCV 78.2 79.2 79.7   MCH 24.3* 24.6* 25.1*  MCHC 31.1 31.0 31.5  RDW 14.5 14.7 15.2    Chemistries   Recent Labs Lab 07/24/15 0507 07/25/15 0449 07/26/15 0220 07/28/15 0620 07/29/15 0450 07/30/15 0603  NA 142 137 137 140 141 139  K 3.3* 3.5 4.3 3.7 3.7 3.7  CL 103 101 103 107 109 106  CO2 33* 30 27 27 26 26   GLUCOSE 144* 145* 107* 114* 135* 146*  BUN 42* 43* 48* 32* 29* 37*  CREATININE 2.59* 2.61* 2.88* 3.25* 3.37* 3.32*  CALCIUM 8.5* 8.2* 8.2* 8.1* 8.2* 8.5*  MG 1.7 2.0 1.9  --  1.8 1.7    CBG:  Recent Labs Lab 07/29/15 1310 07/29/15 1636 07/30/15 0003 07/30/15 0624 07/30/15 0741  GLUCAP 116* 127* 148* 150* 157*    GFR Estimated Creatinine Clearance: 18 mL/min (by C-G formula based on Cr of 3.32).  Coagulation profile No results for input(s): INR, PROTIME in the last 168 hours.  Cardiac Enzymes No results for input(s): CKMB, TROPONINI, MYOGLOBIN in the last 168 hours.  Invalid input(s): CK  Invalid input(s): POCBNP No results for input(s): DDIMER in the last 72 hours. No results for input(s): HGBA1C in the last 72 hours. No results for input(s): CHOL, HDL, LDLCALC, TRIG, CHOLHDL, LDLDIRECT in the last 72 hours. No results for input(s): TSH, T4TOTAL, T3FREE, THYROIDAB in the last 72 hours.  Invalid input(s): FREET3 No results for input(s): VITAMINB12, FOLATE, FERRITIN, TIBC, IRON, RETICCTPCT in the last 72 hours. No results for input(s): LIPASE, AMYLASE in the last 72 hours.  Urine Studies No results for input(s): UHGB, CRYS in the last 72 hours.  Invalid input(s): UACOL, UAPR, USPG, UPH, UTP, UGL, UKET, UBIL, UNIT, UROB, ULEU, UEPI, UWBC, URBC, UBAC, CAST, UCOM, BILUA  MICROBIOLOGY: Recent Results (from the past 240 hour(s))  Culture, blood (routine x 2)     Status: None   Collection Time: 07/20/15  8:04 PM  Result Value Ref Range Status   Specimen Description BLOOD LEFT HAND  Final   Special Requests IN PEDIATRIC BOTTLE [place]  Final   Culture NO GROWTH 5 DAYS   Final   Report Status 07/25/2015 FINAL  Final  Culture, blood (single)     Status: None   Collection Time: 07/21/15  6:10 AM  Result Value Ref Range Status   Specimen Description BLOOD LEFT HAND  Final   Special Requests IN PEDIATRIC BOTTLE  [place]  Final   Culture NO GROWTH 5 DAYS  Final   Report Status 07/26/2015 FINAL  Final    RADIOLOGY STUDIES/RESULTS: Ct Abdomen Pelvis Wo Contrast  07/13/2015  CLINICAL DATA:  Subsequent encounter for nausea vomiting with low abdominal pain and increased drainage from fistula. EXAM: CT ABDOMEN AND PELVIS WITHOUT CONTRAST TECHNIQUE: Multidetector CT imaging of the abdomen and pelvis was performed following the standard protocol without IV contrast. COMPARISON:  06/04/2015. FINDINGS: Lower chest: Motion artifact with some atelectasis in the dependent bases. Hepatobiliary: No focal abnormality in the liver on this study without intravenous  contrast. No evidence of hepatomegaly. Gallbladder is surgically absent. No intrahepatic or extrahepatic biliary dilation. Pancreas: No focal mass lesion. No dilatation of the main duct. No intraparenchymal cyst. No peripancreatic edema. Spleen: No splenomegaly. No focal mass lesion. Adrenals/Urinary Tract: No adrenal nodule or mass. No mass lesion evident in either kidney on this study without intravenous contrast material. No evidence for hydroureteronephrosis. The urinary bladder appears normal for the degree of distention. Stomach/Bowel: Small hiatal hernia noted. Metallic foreign body identified along the posterior wall of the gastric fundus. This is been present on multiple prior studies is well. Duodenum is normally positioned as is the ligament of Treitz. No small bowel wall thickening. No small bowel dilatation. The terminal ileum is normal. The appendix is not visualized, but there is no edema or inflammation in the region of the cecum. Diverticuli are seen scattered along the entire length of the colon without CT findings  of diverticulitis. Small bowel adhesions to the anterior abdominal wall again noted. There is soft tissue attenuation in the midline anterior abdominal wall, at the site of previous surgical wound. This is the area where enterocutaneous fistula was visualized on the previous study. No gas in this region today but no oral contrast was administered to allow assessment of the fistula. Vascular/Lymphatic: There is abdominal aortic atherosclerosis without aneurysm. There is no gastrohepatic or hepatoduodenal ligament lymphadenopathy. No intraperitoneal or retroperitoneal lymphadenopy. No pelvic sidewall lymphadenopathy. Reproductive: Uterus is surgically absent. There is no adnexal mass. Other: No intraperitoneal free fluid. Musculoskeletal: Bone windows reveal no worrisome lytic or sclerotic osseous lesions. IMPRESSION: Stable exam. No new or acute interval findings. There is no gas in the region of the chronic enterocutaneous fistula no evidence to suggest interval development of an abscess in this region. Fistula not well assessed given the lack of oral contrast on today's study. No evidence for bowel obstruction. No intraperitoneal fluid collection and no intraperitoneal free fluid. Electronically Signed   By: Misty Stanley M.D.   On: 07/13/2015 13:27   Dg Chest 2 View  07/13/2015  CLINICAL DATA:  Emesis.  CHF. EXAM: CHEST  2 VIEW COMPARISON:  06/04/2015 chest radiograph FINDINGS: Right PICC terminates in the lower third of the superior vena cava. Stable cardiomediastinal silhouette with mild cardiomegaly. No pneumothorax. No pleural effusion. Clear lungs, with no focal lung consolidation and no pulmonary edema. IMPRESSION: Stable mild cardiomegaly without pulmonary edema. Lungs appear clear. Electronically Signed   By: Ilona Sorrel M.D.   On: 07/13/2015 12:43   US Abdomen Complete  07/21/2015  CLINICAL DATA:  Elevated liver function studies. EXAM: ULTRASOUND ABDOMEN COMPLETE COMPARISON:  Multiple prior  ultrasound and CT examinations. FINDINGS: Gallbladder: Surgically absent Common bile duct: Diameter: 7.1 mm Liver: Normal echogenicity without focal lesion or biliary dilatation. IVC: Normal caliber Pancreas: Poorly visualized but normal on recent CT scans. Spleen: Normal size.  No focal lesions. Right Kidney: Length: 10.4 cm. Mild renal cortical thinning but no mass or hydronephrosis. Left Kidney: Length: 10.3 cm. Mild renal cortical thinning but no mass or hydronephrosis. Abdominal aorta: Normal caliber Other findings: None. IMPRESSION: Status post cholecystectomy with mild associated common bile duct dilatation. Normal sonographic appearance of the liver. No intrahepatic biliary dilatation. Poor visualization of the pancreas but it was normal on recent CT scans. Electronically Signed   By: Marijo Sanes M.D.   On: 07/21/2015 23:43   US Renal  07/17/2015  CLINICAL DATA:  Renal insufficiency EXAM: RENAL ULTRASOUND COMPARISON:  Renal ultrasound June 08, 2015; CT  abdomen and pelvis July 13, 2015 FINDINGS: Right Kidney: Length: 11.1 cm. Echogenicity and renal cortical thickness are within normal limits. No perinephric fluid or hydronephrosis visualized. There is a cyst in the upper pole region measuring 1.5 x 1.5 x 1.5 cm. No sonographically demonstrable calculus or ureterectasis. Left Kidney: Length: 10.8 cm. Echogenicity and renal cortical thickness are within normal limits. No mass, perinephric fluid, or hydronephrosis visualized. No sonographically demonstrable calculus. Bladder: Completely empty and cannot be assessed. IMPRESSION: Cyst arising from upper pole right kidney. Study otherwise unremarkable. Note the urinary bladder is empty and cannot be assessed. Electronically Signed   By: Lowella Grip III M.D.   On: 07/17/2015 11:45   Ir Fluoro Guide Cv Line Right  07/27/2015  INDICATION: 74 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 Patient also has chronic kidney disease. Patient needs IV access  for TPN and antibiotics. EXAM: FLUOROSCOPIC AND ULTRASOUND GUIDED PLACEMENT OF A TUNNELED CENTRAL VENOUS CATHETER Physician: Stephan Minister. Henn, MD FLUOROSCOPY TIME:  54 seconds, 23 mGy MEDICATIONS: 1 mg Versed, 25 mcg fentanyl. A radiology nurse monitored the patient for moderate sedation. ANESTHESIA/SEDATION: Moderate sedation time: 20 minutes PROCEDURE: Informed consent was obtained for placement of a tunneled central venous catheter. The patient was placed supine on the interventional table. Ultrasound confirmed a patent right internal jugularvein. Ultrasound images were obtained for documentation. The right side of the neck was prepped and draped in a sterile fashion. The right side of the neck was anesthetized with 1% lidocaine. Maximal barrier sterile technique was utilized including caps, mask, sterile gowns, sterile gloves, sterile drape, hand hygiene and skin antiseptic. A small incision was made with #11 blade scalpel. A 21 gauge needle directed into the right internal jugular vein with ultrasound guidance. A micropuncture dilator set was placed. A dual lumen Powerline catheter was selected. The skin below the right clavicle was anesthetized and a small incision was made with an #11 blade scalpel. A subcutaneous tunnel was formed to the vein dermatotomy site. The catheter was brought through the tunnel and cut to 22 cm. The vein dermatotomy site was dilated to accommodate a peel-away sheath. The catheter was placed through the peel-away sheath and directed into the central venous structures. The tip of the catheter was placed at the superior cavoatrial junction with fluoroscopy. Fluoroscopic images were obtained for documentation. Both lumens were found to aspirate and flush well. The vein dermatotomy site was closed using a single layer of absorbable suture and Dermabond. The catheter was secured to the skin using Prolene suture. FINDINGS: Catheter tip at the superior cavoatrial junction. Estimated blood loss:  Minimal COMPLICATIONS: None IMPRESSION: Successful placement of a right jugular tunneled central venous catheter using ultrasound and fluoroscopic guidance. Electronically Signed   By: Markus Daft M.D.   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 Patient also has chronic kidney disease. Patient needs IV access for TPN and antibiotics. EXAM: FLUOROSCOPIC AND ULTRASOUND GUIDED PLACEMENT OF A TUNNELED CENTRAL VENOUS CATHETER Physician: Stephan Minister. Henn, MD FLUOROSCOPY TIME:  54 seconds, 23 mGy MEDICATIONS: 1 mg Versed, 25 mcg fentanyl. A radiology nurse monitored the patient for moderate sedation. ANESTHESIA/SEDATION: Moderate sedation time: 20 minutes PROCEDURE: Informed consent was obtained for placement of a tunneled central venous catheter. The patient was placed supine on the interventional table. Ultrasound confirmed a patent right internal jugularvein. Ultrasound images were obtained for documentation. The right side of the neck was prepped and draped in a sterile  fashion. The right side of the neck was anesthetized with 1% lidocaine. Maximal barrier sterile technique was utilized including caps, mask, sterile gowns, sterile gloves, sterile drape, hand hygiene and skin antiseptic. A small incision was made with #11 blade scalpel. A 21 gauge needle directed into the right internal jugular vein with ultrasound guidance. A micropuncture dilator set was placed. A dual lumen Powerline catheter was selected. The skin below the right clavicle was anesthetized and a small incision was made with an #11 blade scalpel. A subcutaneous tunnel was formed to the vein dermatotomy site. The catheter was brought through the tunnel and cut to 22 cm. The vein dermatotomy site was dilated to accommodate a peel-away sheath. The catheter was placed through the peel-away sheath and directed into the central venous structures. The tip of the catheter was placed  at the superior cavoatrial junction with fluoroscopy. Fluoroscopic images were obtained for documentation. Both lumens were found to aspirate and flush well. The vein dermatotomy site was closed using a single layer of absorbable suture and Dermabond. The catheter was secured to the skin using Prolene suture. FINDINGS: Catheter tip at the superior cavoatrial junction. Estimated blood loss: Minimal COMPLICATIONS: None IMPRESSION: Successful placement of a right jugular tunneled central venous catheter using ultrasound and fluoroscopic guidance. Electronically Signed   By: Markus Daft M.D.   On: 07/27/2015 17:40   Dg Chest Port 1 View  07/25/2015  CLINICAL DATA:  PICC line placement EXAM: PORTABLE CHEST 1 VIEW COMPARISON:  07/13/2015 FINDINGS: Left PICC line tip is at the innominate SVC junction. This could be advanced 5 cm to the SVC RA junction. Normal heart size and vascularity. Minor streaky left base atelectasis. Right lung remains clear. No edema, effusion, or pneumothorax. Trachea midline. Atherosclerosis of the aorta. IMPRESSION: Left PICC line tip at the innominate SVC junction and can be advanced 5 cm to the SVC RA junction. Left base atelectasis Thoracic atherosclerosis Electronically Signed   By: Jerilynn Mages.  Shick M.D.   On: 07/25/2015 12:07   Dg Ugi W/small Bowel  07/20/2015  CLINICAL DATA:  74 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 Prior repair. Question level of fistula. Subsequent encounter. EXAM: UPPER GI SERIES WITH SMALL BOWEL FOLLOW-THROUGH FLUOROSCOPY TIME:  Radiation Exposure Index (as provided by the fluoroscopic device): 1634.12 micro Gy TECHNIQUE: Water-soluble upper GI series followed by small bowel were obtained after discussion with Dr. Kieth Brightly. COMPARISON:  None. FINDINGS: Draining clips are noted on scout view. Patient was only able to ingest a small amount of Omnipaque 300 at one time. Total of 150 cc was ingested. The present examination was specifically tailored to  evaluate for level of the enterocutaneous fistula. Gastroesophageal reflux noted. Patient was kept in the upright position to avoid aspiration. Scattered duodenal diverticulum. Nonspecific eggshell calcification right upper quadrant. Enterocutaneous fistulas best seen on lateral overhead imaging. This appears to arise from the proximal to mid ileum. Distal aspect of the small bowel not visualized. Patient was tired of the exam at this point. Study terminated. IMPRESSION: Enterocutaneous fistulas best seen on lateral overhead imaging. This appears to arise from the proximal to mid ileum. Exam otherwise limited as noted above. Electronically Signed   By: Genia Del M.D.   On: 07/20/2015 15:46    Oren Binet, MD  [facility] Pager:[phone]  If 7PM-7AM, please contact night-coverage [email or web address] Password [email or web address] 07/30/2015, 11:44 AM   LOS: 17 days

## 2015-07-30 NOTE — Progress Notes (Addendum)
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   No Known Allergies  Patient Measurements: Height: 5\' 4"  (162.6 cm) Weight: 241 lb 1.6 oz (109.362 kg) IBW/kg (Calculated) : 54.7    Vital Signs: Temp: 98.5 F (36.9 C) (11/17 0648) Temp Source: Oral (11/17 0648) BP: 104/43 mmHg (11/17 0648) Pulse Rate: 99 (11/17 0648) Intake/Output from previous day: 11/16 0701 - 11/17 0700 In: 628.8 [TPN:628.8] Out: 600 [Urine:400; Drains:200] Intake/Output from this shift:    Labs:  Recent Labs  07/28/15 0620 07/29/15 0450 07/30/15 0603  WBC 4.6 4.7 5.6  HGB 6.8* 8.5* 8.8*  HCT 21.9* 27.4* 27.9*  PLT 229 219 218     Recent Labs  07/28/15 0620 07/29/15 0450 07/30/15 0603  NA 140 141 139  K 3.7 3.7 3.7  CL 107 109 106  CO2 27 26 26   GLUCOSE 114* 135* 146*  BUN 32* 29* 37*  CREATININE 3.25* 3.37* 3.32*  CALCIUM 8.1* 8.2* 8.5*  MG  --  1.8 1.7  PHOS  --  4.3 5.2*  PROT  --  5.1* 5.6*  ALBUMIN  --  1.8* 1.9*  AST  --  16 17  ALT  --  29 25  ALKPHOS  --  80 83  BILITOT  --  0.7 0.4  PREALBUMIN 11.9*  --   --    Estimated Creatinine Clearance: 18 mL/min (by C-G formula based on Cr of 3.32).    Recent Labs  07/30/15 0003 07/30/15 0624 07/30/15 0741  GLUCAP 148* 150* 157*   Insulin Requirements in the past 12 hours:  3 units  Nutritional Goals: per RD note 11/10 1700-1900 kCal, 100-110 grams of protein per day  Current Nutrition:  NPO TPN stopped 11/12 due to VTE above and below PICC line and PICC removed 11/15 TPN resumed via tunneled catheter  Per discussions/notes from [facility], was receiving cycled Clinimix 5/15 alternated with E 5/15, 2038ml over 12 hours (2000-0800). Per RN from facility [place], patient was receiving TPN during her entire stay, however patient's admission labs/dehydrated state as well as report from daughter make this questionable.  Assessment: 42 YOF on  chronic TPN for a fistula who was admitted from [facility] with lethargy, 2 days of vomiting, increased drainage from fistula and severe dehydration. Her EC output was bilious and Eakin's pouch was removed at facility. CT scan negative for infection however blood cultures from 10/31 were 2/2 CoNS. TPN held for line holiday and PICC was removed. Repeat blood cultures have been clear - and a new PICC was placed 11/10 to resume TPN. PICC removed 11/12 due to VTE above and below PICC line. TPN stopped 11/12.  Tunneled catheter placed by IR 11/14.   resumed TPN 11/5.   Surgeries/Procedures: 04/28/15: wound exploration, explanation of abdominal mesh Multiple other abdominal surgeries GI:  ECF output/24h:500 cc. Continues on loperamide, octreotide, PPI IV. Pre-albumin 35.1 >>11.9 - dropped b/c was not getting nutrition support during line holiday due to sepsis. Endo: A1C this admission is 6.2 (up from 5.7 in July 2016) .  sensitive SSI, 3 units given, CBGs 148 - 157 w/ TPN at 83 ml/hr Lytes:K 3.7,  Mg 1.7,phos slightly elevated at 5.2  Ca~10. Caxphos ~  53.   It appears as if the patient's home TPN was alternating between lytes and no lytes.   Renal: AoCKD III d/t dehydration - SCr 2.61>>2.88>>3.25>>3.37> 3.32. IVF stopped 11/15 Hepatobil: LFTs down, now WNL,  T bili wnl, Alk phos WNL now, TG 84 (11/7) Pulm:  OSA.  RA. CPAP at night- refusing Cards: EF 55%; VSS- no meds ID: Vanc for continued treatment of CoNS PICC line infection was dc'd 11/15 due to renal worsening- OKd by ID    - s/p PICC removal 11/4 and replaced 11/10, out 11/12; tunneled catheter 11/14. . S/p CTX for UTI, on nystatin for thrush.  Best Practices Hep SQ TPN Access: PICC placed 11/10 for TPN removed 11/12 11/14 tunneled catheter placed TPN start date: chronic TPN- was on at home prior to admission in Sept, and continued after discharge. resumed 11/1 at the hospital, off 11/4, resumed 11/10, off 11/12, resumed  11/15  Plan: - cycle TPN over 18 hours. Clinimix E 5/15 2000 mls + IVFE 234 mls to provide 100% support with  ~ 100 gm protein and 1882 kcals - continue MVI and trace elements in TPN - continue SSI while cycling day # 1, will probably be able to DC it if tolerated - 1 gm mag bolus - check am bmet, mag, phos  Eudelia Bunch, Pharm.D. [phone] 07/30/2015 8:00 AM

## 2015-07-30 NOTE — Progress Notes (Signed)
[facility] ROUNDING NOTE   Subjective:   Interval History: resting comforatbly in bed  Objective:  Vital signs in last 24 hours:  Temp:  [98 F (36.7 C)-98.8 F (37.1 C)] 98.4 F (36.9 C) (11/17 1100) Pulse Rate:  [97-102] 97 (11/17 1100) Resp:  [15-18] 15 (11/17 1100) BP: (94-117)/(43-55) 117/55 mmHg (11/17 1100) SpO2:  [97 %-100 %] 97 % (11/17 0648)  Weight change:  Filed Weights   07/27/15 0608 07/28/15 0530 07/29/15 0636  Weight: 106.142 kg (234 lb) 109.68 kg (241 lb 12.8 oz) 109.362 kg (241 lb 1.6 oz)    Intake/Output: I/O last 3 completed shifts: In: 1498.8 [Blood:320] Out: 1150 [Urine:650; Drains:500]   Intake/Output this shift:     CVS- RRR RS- CTA ABD- BS present soft non-distended EXT- 1 + edema   Basic Metabolic Panel:  Recent Labs Lab 07/24/15 0507 07/25/15 0449 07/26/15 0220 07/28/15 0620 07/29/15 0450 07/30/15 0603  NA 142 137 137 140 141 139  K 3.3* 3.5 4.3 3.7 3.7 3.7  CL 103 101 103 107 109 106  CO2 33* 30 27 27 26 26   GLUCOSE 144* 145* 107* 114* 135* 146*  BUN 42* 43* 48* 32* 29* 37*  CREATININE 2.59* 2.61* 2.88* 3.25* 3.37* 3.32*  CALCIUM 8.5* 8.2* 8.2* 8.1* 8.2* 8.5*  MG 1.7 2.0 1.9  --  1.8 1.7  PHOS 3.8  --   --   --  4.3 5.2*    Liver Function Tests:  Recent Labs Lab 07/29/15 0450 07/30/15 0603  AST 16 17  ALT 29 25  ALKPHOS 80 83  BILITOT 0.7 0.4  PROT 5.1* 5.6*  ALBUMIN 1.8* 1.9*   No results for input(s): LIPASE, AMYLASE in the last 168 hours. No results for input(s): AMMONIA in the last 168 hours.  CBC:  Recent Labs Lab 07/28/15 0620 07/29/15 0450 07/30/15 0603  WBC 4.6 4.7 5.6  HGB 6.8* 8.5* 8.8*  HCT 21.9* 27.4* 27.9*  MCV 78.2 79.2 79.7  PLT 229 219 218    Cardiac Enzymes: No results for input(s): CKTOTAL, CKMB, CKMBINDEX, TROPONINI in the last 168 hours.  BNP: Invalid input(s): POCBNP  CBG:  Recent Labs Lab 07/29/15 1310 07/29/15 1636 07/30/15 0003 07/30/15 0624  07/30/15 0741  GLUCAP 116* 127* 148* 150* 157*    Microbiology: Results for orders placed or performed during the hospital encounter of 07/13/15  Culture, Urine     Status: None   Collection Time: 07/13/15 12:14 PM  Result Value Ref Range Status   Specimen Description URINE, RANDOM  Final   Special Requests ADDED 2258  Final   Culture >=100,000 COLONIES/mL PROTEUS MIRABILIS  Final   Report Status 07/16/2015 FINAL  Final   Organism ID, Bacteria PROTEUS MIRABILIS  Final      Susceptibility   Proteus mirabilis - MIC*    AMPICILLIN <=2 SENSITIVE Sensitive     CEFAZOLIN <=4 SENSITIVE Sensitive     CEFTRIAXONE <=1 SENSITIVE Sensitive     CIPROFLOXACIN <=0.25 SENSITIVE Sensitive     GENTAMICIN <=1 SENSITIVE Sensitive     IMIPENEM 4 SENSITIVE Sensitive     NITROFURANTOIN 128 RESISTANT Resistant     TRIMETH/SULFA <=20 SENSITIVE Sensitive     AMPICILLIN/SULBACTAM <=2 SENSITIVE Sensitive     PIP/TAZO <=4 SENSITIVE Sensitive     * >=100,000 COLONIES/mL PROTEUS MIRABILIS  Blood culture (routine x 2)     Status: None   Collection Time: 07/13/15  2:50 PM  Result Value Ref Range Status  Specimen Description BLOOD PICC LINE  Final   Special Requests BOTTLES DRAWN AEROBIC AND ANAEROBIC 5ML  Final   Culture  Setup Time   Final    GRAM POSITIVE COCCI IN CLUSTERS IN BOTH AEROBIC AND ANAEROBIC BOTTLES CRITICAL RESULT CALLED TO, READ BACK BY AND VERIFIED WITH: TO BQIU(RN) BY TCLEVELAND 07/14/2015 AT 5:58 AM    Culture STAPHYLOCOCCUS SPECIES (COAGULASE NEGATIVE)  Final   Report Status 07/16/2015 FINAL  Final   Organism ID, Bacteria STAPHYLOCOCCUS SPECIES (COAGULASE NEGATIVE)  Final      Susceptibility   Staphylococcus species (coagulase negative) - MIC*    CIPROFLOXACIN <=0.5 SENSITIVE Sensitive     ERYTHROMYCIN <=0.25 SENSITIVE Sensitive     GENTAMICIN <=0.5 SENSITIVE Sensitive     OXACILLIN >=4 RESISTANT Resistant     TETRACYCLINE 2 SENSITIVE Sensitive     VANCOMYCIN 2 SENSITIVE Sensitive      TRIMETH/SULFA <=10 SENSITIVE Sensitive     CLINDAMYCIN >=8 RESISTANT Resistant     RIFAMPIN <=0.5 SENSITIVE Sensitive     Inducible Clindamycin NEGATIVE Sensitive     * STAPHYLOCOCCUS SPECIES (COAGULASE NEGATIVE)  Blood culture (routine x 2)     Status: None   Collection Time: 07/13/15  2:55 PM  Result Value Ref Range Status   Specimen Description BLOOD LEFT HAND  Final   Special Requests   Final    BOTTLES DRAWN AEROBIC AND ANAEROBIC BLUE 5CC, RED 3CC   Culture  Setup Time   Final    GRAM POSITIVE COCCI IN CLUSTERS AEROBIC BOTTLE ONLY CRITICAL RESULT CALLED TO, READ BACK BY AND VERIFIED WITH: J NARAMDAS,RN AT 1122 07/14/15 BY L BENFIELD    Culture   Final    STAPHYLOCOCCUS SPECIES (COAGULASE NEGATIVE) SUSCEPTIBILITIES PERFORMED ON PREVIOUS CULTURE WITHIN THE LAST 5 DAYS.    Report Status 07/16/2015 FINAL  Final  Culture, blood (routine x 2)     Status: None   Collection Time: 07/16/15  5:10 AM  Result Value Ref Range Status   Specimen Description BLOOD LEFT HAND  Final   Special Requests IN PEDIATRIC BOTTLE 2.5CC  Final   Culture  Setup Time   Final    GRAM POSITIVE COCCI IN CLUSTERS AEROBIC BOTTLE ONLY CRITICAL RESULT CALLED TO, READ BACK BY AND VERIFIED WITH: MIKKI MURPHY @0541  07/17/15 MKELLY    Culture   Final    STAPHYLOCOCCUS SPECIES (COAGULASE NEGATIVE) SUSCEPTIBILITIES PERFORMED ON PREVIOUS CULTURE WITHIN THE LAST 5 DAYS.    Report Status 07/18/2015 FINAL  Final  Culture, blood (routine x 2)     Status: None   Collection Time: 07/16/15  5:23 AM  Result Value Ref Range Status   Specimen Description BLOOD LEFT HAND  Final   Special Requests IN PEDIATRIC BOTTLE 2CC  Final   Culture NO GROWTH 5 DAYS  Final   Report Status 07/21/2015 FINAL  Final  Culture, blood (routine x 2)     Status: None   Collection Time: 07/20/15  8:04 PM  Result Value Ref Range Status   Specimen Description BLOOD LEFT HAND  Final   Special Requests IN PEDIATRIC BOTTLE [place]  Final    Culture NO GROWTH 5 DAYS  Final   Report Status 07/25/2015 FINAL  Final  Culture, blood (single)     Status: None   Collection Time: 07/21/15  6:10 AM  Result Value Ref Range Status   Specimen Description BLOOD LEFT HAND  Final   Special Requests IN PEDIATRIC BOTTLE  [place]  Final  Culture NO GROWTH 5 DAYS  Final   Report Status 07/26/2015 FINAL  Final    Coagulation Studies: No results for input(s): LABPROT, INR in the last 72 hours.  Urinalysis:  Recent Labs  07/29/15 2156  COLORURINE YELLOW  LABSPEC 1.007  PHURINE 5.0  GLUCOSEU NEGATIVE  HGBUR TRACE*  BILIRUBINUR NEGATIVE  KETONESUR NEGATIVE  PROTEINUR NEGATIVE  NITRITE NEGATIVE  LEUKOCYTESUR SMALL*      Imaging: No results found.   Medications:   . Marland KitchenTPN (CLINIMIX-E) Adult     And  . fat emulsion    . Marland KitchenTPN (CLINIMIX-E) Adult 83 mL/hr at 07/29/15 1720   And  . fat emulsion 240 mL (07/29/15 1720)   . furosemide  40 mg Intravenous Q12H  . heparin subcutaneous  5,000 Units Subcutaneous 3 times per day  . insulin aspart  0-9 Units Subcutaneous 4 times per day  . loperamide  2 mg Oral 4 times per day  . nystatin  5 mL Oral QID  . octreotide  100 mcg Subcutaneous Q12H  . 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   1. AoCKD: Initially with profound azotemia due to dehydration / hypovolemia with Indomethacin as the etiology. , Weight steadily increasing, hard to say how accurately. She is volume up on exam with hypoalbuminemia / malnutrition. Likely low oncotic pressure. Has not received contrast. Has been  on Vancomycin.  creatinine slightly increased today 1. IV Fluid stopped started lasix 2. Vancomycin stopped 3. Renal US 11/4 w/o obstruction  4. Continue TPN 5. Not a dialysis candidate.  7. Daily weights, Daily Renal Panel, Strict I/Os, Avoid nephrotoxins (NSAIDs, judicious IV Contrast). 1. Chrnoic enterocutaneous fistula, intermittent high outpt, TPN dependency -- surgery following. Supposed to restart TPN today.  2. NAG metabolic acidosis: Resolved.  3. AMS: Back to baseline. Uremia resolved.  4. CNS bacteremia and Proteus UTI 1. Negative Cx to date. Replaced PICC and then IR placed central line.  2. Vancomycin was discontinued 3. Abx per primary team:  5. FTT -- goals of care / palliative care seem reasonable to explore at this time  Appears stable today with replaced electrolytes    LOS: 17 Marissa [name] W @TODAY @11 :31 AM

## 2015-07-31 DIAGNOSIS — E119 Type 2 diabetes mellitus without complications: Secondary | ICD-10-CM

## 2015-07-31 DIAGNOSIS — Z515 Encounter for palliative care: Secondary | ICD-10-CM

## 2015-07-31 LAB — BASIC METABOLIC PANEL
ANION GAP: 7 (ref 5–15)
BUN: 54 mg/dL — AB (ref 6–20)
CHLORIDE: 103 mmol/L (ref 101–111)
CO2: 29 mmol/L (ref 22–32)
Calcium: 8.4 mg/dL — ABNORMAL LOW (ref 8.9–10.3)
Creatinine, Ser: 3.44 mg/dL — ABNORMAL HIGH (ref 0.44–1.00)
GFR calc Af Amer: 14 mL/min — ABNORMAL LOW (ref >60)
GFR, EST NON AFRICAN AMERICAN: 12 mL/min — AB (ref >60)
GLUCOSE: 133 mg/dL — AB (ref 65–99)
POTASSIUM: 3.8 mmol/L (ref 3.5–5.1)
Sodium: 139 mmol/L (ref 135–145)

## 2015-07-31 LAB — GLUCOSE, CAPILLARY
GLUCOSE-CAPILLARY: 114 mg/dL — AB (ref 65–99)
GLUCOSE-CAPILLARY: 122 mg/dL — AB (ref 65–99)
GLUCOSE-CAPILLARY: 181 mg/dL — AB (ref 65–99)
Glucose-Capillary: 96 mg/dL (ref 65–99)
Glucose-Capillary: 156 mg/dL — ABNORMAL HIGH (ref 65–99)

## 2015-07-31 LAB — MAGNESIUM: MAGNESIUM: 1.8 mg/dL (ref 1.7–2.4)

## 2015-07-31 LAB — PHOSPHORUS: Phosphorus: 6.1 mg/dL — ABNORMAL HIGH (ref 2.5–4.6)

## 2015-07-31 MED ORDER — DEXTROSE 10 % IV SOLN
INTRAVENOUS | Status: AC
  Administered 2015-07-31: 09:00:00 via INTRAVENOUS

## 2015-07-31 MED ORDER — MAGNESIUM SULFATE 2 GM/50ML IV SOLN
2.0000 g | Freq: Once | INTRAVENOUS | Status: AC
  Administered 2015-07-31: 2 g via INTRAVENOUS
  Filled 2015-07-31: qty 50

## 2015-07-31 MED ORDER — TRACE MINERALS CR-CU-MN-SE-ZN 10-1000-500-60 MCG/ML IV SOLN
INTRAVENOUS | Status: AC
  Administered 2015-07-31: 18:00:00 via INTRAVENOUS
  Filled 2015-07-31: qty 1988

## 2015-07-31 MED ORDER — MODAFINIL 100 MG PO TABS
50.0000 mg | ORAL_TABLET | Freq: Every day | ORAL | Status: DC
  Administered 2015-07-31 – 2015-08-03 (×4): 50 mg via ORAL
  Filled 2015-07-31 (×5): qty 1

## 2015-07-31 MED ORDER — HYDROMORPHONE HCL 1 MG/ML IJ SOLN: 0.2500 mg | INTRAMUSCULAR | Status: DC | PRN

## 2015-07-31 MED ORDER — ESCITALOPRAM OXALATE 10 MG PO TABS
10.0000 mg | ORAL_TABLET | Freq: Every day | ORAL | Status: DC
  Administered 2015-07-31 – 2015-08-03 (×4): 10 mg via ORAL
  Filled 2015-07-31 (×5): qty 1

## 2015-07-31 MED ORDER — FAT EMULSION 20 % IV EMUL
234.0000 mL | INTRAVENOUS | Status: AC
  Administered 2015-07-31: 234 mL via INTRAVENOUS
  Filled 2015-07-31: qty 250

## 2015-07-31 MED ORDER — ACETAMINOPHEN 500 MG PO TABS
500.0000 mg | ORAL_TABLET | Freq: Three times a day (TID) | ORAL | Status: DC
  Administered 2015-07-31 – 2015-08-04 (×12): 500 mg via ORAL
  Filled 2015-07-31 (×12): qty 1

## 2015-07-31 NOTE — Consult Note (Addendum)
WOC wound follow up Family members are at bedside, they feel that current pouch which was just applied this am is leaking, and they are upset about exposed moisture-associated skin damage skin areas to abd.  Applied new pouch, although current one was intact with good seal. Discussed ongoing problems with constant moisture to skin behind bag and now attempting to leave open to air and crusting with ostomy powder to promote healing and down-sized Eakin pouch today.   Julien Girt MSN, RN, [place], [place], [place]

## 2015-07-31 NOTE — Progress Notes (Signed)
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   No Known Allergies  Patient Measurements: Height: 5\' 4"  (162.6 cm) Weight: 241 lb 1.6 oz (109.362 kg) IBW/kg (Calculated) : 54.7    Vital Signs: Temp: 98.4 F (36.9 C) (11/18 0608) Temp Source: Oral (11/18 [id number]) BP: 106/55 mmHg (11/18 [id number]) Pulse Rate: 88 (11/18 0608) Intake/Output from previous day: 11/17 0701 - 11/18 0700 In: 10 [I.V.:10] Out: 150 [Drains:150] Intake/Output from this shift: Total I/O In: -  Out: 300 [Urine:300]  Labs:  Recent Labs  07/29/15 0450 07/30/15 0603  WBC 4.7 5.6  HGB 8.5* 8.8*  HCT 27.4* 27.9*  PLT 219 218     Recent Labs  07/29/15 0450 07/30/15 0603 07/31/15 0440  NA 141 139 139  K 3.7 3.7 3.8  CL 109 106 103  CO2 26 26 29   GLUCOSE 135* 146* 133*  BUN 29* 37* 54*  CREATININE 3.37* 3.32* 3.44*  CALCIUM 8.2* 8.5* 8.4*  MG 1.8 1.7 1.8  PHOS 4.3 5.2* 6.1*  PROT 5.1* 5.6*  --   ALBUMIN 1.8* 1.9*  --   AST 16 17  --   ALT 29 25  --   ALKPHOS 80 83  --   BILITOT 0.7 0.4  --    Estimated Creatinine Clearance: 17.4 mL/min (by C-G formula based on Cr of 3.44).    Recent Labs  07/30/15 1729 07/30/15 2112 07/31/15 0015  GLUCAP 119* 163* 122*   Insulin Requirements in the past 12 hours:  1 units  Nutritional Goals: per RD note 11/15 1700-1900 kCal, 100-110 grams of protein per day  Current Nutrition:  NPO Clinimix E5/15 + lipids 20% cycled over 18 hours  Nutrition from PTA:  Per discussions/notes from [facility], was receiving cycled Clinimix 5/15 alternated with E 5/15, 2029ml over 12 hours (2000-0800). Per RN from facility [place], patient was receiving TPN during her entire stay, however patient's admission labs/dehydrated state as well as report from daughter make this questionable.  Assessment: 46 YOF on chronic TPN for a fistula who was admitted from [facility] with lethargy, 2  days of vomiting, increased drainage from fistula and severe dehydration. Her EC output was bilious and Eakin's pouch was removed at facility. CT scan negative for infection however blood cultures from 10/31 were 2/2 CoNS. TPN held for line holiday and PICC was removed. Repeat blood cultures have been clear - and a new PICC was placed 11/10 to resume TPN. PICC removed 11/12 due to VTE above and below PICC line. TPN stopped 11/12.  Tunneled catheter placed by IR 11/14.   resumed TPN 11/5.   Surgeries/Procedures: 04/28/15: wound exploration, explanation of abdominal mesh Multiple other abdominal surgeries GI:  175ml output yesterday. Continues on loperamide, octreotide, PPI IV. Pre-albumin 35.1 >>11.9  Endo: A1C this admission is 6.2 (up from 5.7 in July 2016) - CBGs well controlled Lytes:K 3.8, Mg 1.8, phos elevated at 6.1 (Ca x Phos >55)  Renal: AoCKD III d/t dehydration - SCr up to 3.44. IVF stopped 11/15 Hepatobil: LFTs WNL, TG 84 (11/7) Pulm:  97% RA Cards: BP 106/55, HR 88, EF 55% - lasix ID: Completed antibiotics, continued on nystatin for thrush Best Practices Hep SQ  TPN Access: PICC placed 11/10 for TPN removed 11/12 11/14 tunneled catheter placed TPN start date: chronic TPN- was on at home prior to admission in Sept, and continued after discharge. resumed 11/1 at the hospital, off 11/4, resumed 11/10,  off 11/12, resumed 11/15  Plan: - Stop current TPN due to possible Ca/Phos precipitation - Start D10 infusion at 134ml/hr until noon (when TPN was previously scheduled to stop) - Cycle Clinimix 5/15 (WITHOUT lytes) over 18 hours - provides 100% nutrition goals (100gm protein/day + 1880 Kcal/day) - Daily MVI and trace elements in TPN - Continue SSI until cycled TPN is at goal - Magnesium 2gm IV x 1 - F/u AM BMET, Mg, Phos  Salome Arnt, PharmD, BCPS Pager # [phone] 07/31/2015 8:40 AM

## 2015-07-31 NOTE — Clinical Social Work Note (Signed)
CSW talked with patient's daughter, Briselda Lippincott regarding patient's discharge and per daughter, patient will return to [facility].  Received call from Imagene Sheller with [facility] [phone]) regarding patient and authorization given in case patient discharges over the weekend.  Auth [id number].  Call made to [facility] admissions staff Tanji and information given regarding authorization. Per Octavio Graves, patient's daughter has expressed to them that patient will not be returning and she has retrieved patient's belongings from her room. Follow-up will be made with daughter regarding patient's discharge plan, especially since patient is on TNA and continuous suctioning.   Shaquil Aldana Givens, MSW, LCSW Licensed Clinical Social Worker [place] [phone]

## 2015-07-31 NOTE — Progress Notes (Signed)
PATIENT DETAILS Name: Marissa [name] Age: 74 y.o. Sex: female Date of Birth: 03/14/1941 Admit Date: 07/13/2015 Admitting Physician Waldemar Dickens, MD AJG:[id number] Ronnald Ramp, MD  Brief narrative: Patient is a 74 year old female with known history of enterocutaneous fistula-who was recently admitted on 9/22 for increased ostomy output with encephalopathy. She was treated with TNA/Sandostatin-a eakin pouch was placed, she was subsequently discharged to SNF on 10/4. Patient was readmitted on 10/31, after increased biliary drainage from her fistula along with biliary emesis. Patient was found to have acute on chronic kidney disease, coagulase negative Staphylococcus bacteremia. General surgery/infectious disease has been following patient. Her prior PICC line has been discontinued, and a Port-A-Cath has been placed. Unfortunately, hospital course has now been complicated by redevelopment of acute renal failure, along with severe failure to thrive syndrome. Plans are for definite surgical repair of enterocutaneous fistula once patient is nutritionally and physically ready. Currently remains NPO and on TNA-seen by Palliative care-after discussion with family-tentative plans are for SNF over the next few days.  Subjective: Uncomfortable with dressing changes-doesn't understand why she is still in the hospital/  Assessment/Plan: Principal Problem: Acute on chronic renal failure stage III: Felt to have acute renal failure secondary to prerenal azotemia from dehydration/high ostomy output which initially improved, but recurred-thought to be secondary to vancomycin. Creatinine seems to have stabilized/plateaued. Nephrology following. Per nephrology, patient not a long-term candidate for hemodialysis.  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 Continue Sandostatin and  loperamide. Remains  NPO-TNA-200 ostomy output yesterday. Per general surgery-currently a poor surgical candidate for repair of enterocutaneous fistula-needs to be optimized both nutritionally and physically before proceeding with surgery.  Sepsis secondary to coagulase negative Staphylococcus bacteremia: Likely catheter-related bacteremia-likely present on admission.PICC line has been discontinued.Port placed by IR on 11/14. Although recommendations were to stop vancomycin on 11/20-given worsening renal function-vancomycin was discontinued on 11/15. Dr. Tana Coast spoke with infectious disease M.D.-Dr Linus Salmons who recommended to observe off antibiotics. Remains afebrile and without leukocytosis. Note blood cultures on 11/7 and 11/8 negative so far.   Anemia: Secondary to acute on chronic illness/CKD- transfuse 2 units of PRBC on 11/15-follow hemoglobin. No overt evidence of any blood loss.  Proteus IOM:[id number], completed 5 days of empiric Rocephin.  Transaminitis: Resolved. Likely secondary to sepsis/shock liver. History of cholecystectomy. Abdominal ultrasound on 11/8 did not show any major intrahepatic/extrahepatic findings.  Acute encephalopathy: Likely secondary to sepsis-resolved. Although very deconditioned/weak-is awake and alert.  Acute on Chronic diastolic heart failure: Seems to have developed volume overload-currently on IV Lasix. Follow closely.   Oral thrush: Continue nystatin  Hyponatremia: Resolved.  Severe malnutrition with failure to thrive:on TPN-PT/OT following. Patient remains very frustrated about her overall health.She seems to lack motivation for ambulation etc. After speaking with family-palliative care team met with family on 11/18-plans are to continue with current measures-family is helpful of eventual recovery. Family very much aware that if no recovering over the next few weeks-then will likely require transitioning to hospice care.     OSA (obstructive sleep apnea):  continue CPAP  Disposition: Remain inpatient-SNF over the next few days  Antimicrobial agents  See below  Anti-infectives    Start     Dose/Rate Route Frequency Ordered Stop   07/27/15 1557  ceFAZolin (ANCEF) 2-3 GM-% IVPB SOLR  Status:  Discontinued    Comments:  Shular,  Leslie   : cabinet override      07/27/15 1557 07/27/15 1619   07/23/15 1000  vancomycin (VANCOCIN) 1,250 mg in sodium chloride 0.9 % 250 mL IVPB  Status:  Discontinued     1,250 mg 166.7 mL/hr over 90 Minutes Intravenous Every 48 hours 07/23/15 0908 07/28/15 1353   07/16/15 1000  vancomycin (VANCOCIN) 1,500 mg in sodium chloride 0.9 % 500 mL IVPB  Status:  Discontinued     1,500 mg 250 mL/hr over 120 Minutes Intravenous Every 48 hours 07/14/15 0956 07/15/15 1035   07/16/15 1000  vancomycin (VANCOCIN) IVPB 1000 mg/200 mL premix  Status:  Discontinued     1,000 mg 200 mL/hr over 60 Minutes Intravenous Every 48 hours 07/15/15 1035 07/20/15 1242   07/14/15 1500  cefTRIAXone (ROCEPHIN) 1 g in dextrose 5 % 50 mL IVPB  Status:  Discontinued     1 g 100 mL/hr over 30 Minutes Intravenous Every 24 hours 07/13/15 1647 07/17/15 1113   07/14/15 1015  vancomycin (VANCOCIN) 1,500 mg in sodium chloride 0.9 % 500 mL IVPB     1,500 mg 250 mL/hr over 120 Minutes Intravenous NOW 07/14/15 0943 07/14/15 1210   07/13/15 1400  cefTRIAXone (ROCEPHIN) 1 g in dextrose 5 % 50 mL IVPB     1 g 100 mL/hr over 30 Minutes Intravenous  Once 07/13/15 1359 07/13/15 1526      DVT Prophylaxis:  Prophylactic Heparin  Code Status:  DNR  Family Communication [name] [place]-Daughter over the phone 11/16-none at bedside today  Procedures: IR placed tunneled right jugular central line on 11/14  CONSULTS:  general surgery   Renal  Time spent 40 minutes-Greater than 50% of this time was spent in counseling, explanation of diagnosis, planning of further management, and coordination of care.  MEDICATIONS: Scheduled Meds: . acetaminophen  500  mg Oral 3 times per day  . escitalopram  10 mg Oral Daily  . furosemide  40 mg Intravenous Q12H  . heparin subcutaneous  5,000 Units Subcutaneous 3 times per day  . insulin aspart  0-9 Units Subcutaneous 4 times per day  . loperamide  2 mg Oral 4 times per day  . modafinil  50 mg Oral Daily  . nystatin  5 mL Oral QID  . octreotide  100 mcg Subcutaneous Q12H  . pantoprazole (PROTONIX) IV  40 mg Intravenous Q24H   Continuous Infusions: . TPN (CLINIMIX) Adult without lytes     And  . fat emulsion     PRN Meds:.diphenhydrAMINE, diphenhydrAMINE-zinc acetate, Glycerin (Adult), HYDROmorphone (DILAUDID) injection, hydrOXYzine, iohexol, nitroGLYCERIN, ondansetron **OR** ondansetron (ZOFRAN) IV, oxyCODONE, sodium chloride, sodium chloride    PHYSICAL EXAM: Vital signs in last 24 hours: Filed Vitals:   07/30/15 1409 07/30/15 2113 07/31/15 0608 07/31/15 1421  BP: 109/62 100/52 106/55 127/43  Pulse: 94 94 88 100  Temp: 98.2 F (36.8 C) 98.3 F (36.8 C) 98.4 F (36.9 C) 98.9 F (37.2 C)  TempSrc: Oral Oral Oral Oral  Resp: $Remo'15 19 19 19  'DJBbo$ Height:      Weight:      SpO2: 98% 98% 97% 100%    Weight change:  Filed Weights   07/27/15 0608 07/28/15 0530 07/29/15 0636  Weight: 106.142 kg (234 lb) 109.68 kg (241 lb 12.8 oz) 109.362 kg (241 lb 1.6 oz)   Body mass index is 41.36 kg/(m^2).   Gen Exam: Awake and alert with clear but slow speech. Significantly deconditioned and weak. Neck: Supple, Chest: B/L Clear.  CVS: S1 S2 Regular, no murmurs.  Abdomen: Soft, midline wound, eakin pouch to wall suction Extremities: + edema, lower extremities warm to touch. Neurologic: Non Focal.       Intake/Output from previous day:  Intake/Output Summary (Last 24 hours) at 07/31/15 1436 Last data filed at 07/31/15 1421  Gross per 24 hour  Intake     10 ml  Output    450 ml  Net   -440 ml     LAB RESULTS: CBC  Recent Labs Lab 07/28/15 0620 07/29/15 0450 07/30/15 0603  WBC 4.6 4.7 5.6    HGB 6.8* 8.5* 8.8*  HCT 21.9* 27.4* 27.9*  PLT 229 219 218  MCV 78.2 79.2 79.7  MCH 24.3* 24.6* 25.1*  MCHC 31.1 31.0 31.5  RDW 14.5 14.7 15.2    Chemistries   Recent Labs Lab 07/25/15 0449 07/26/15 0220 07/28/15 0620 07/29/15 0450 07/30/15 0603 07/31/15 0440  NA 137 137 140 141 139 139  K 3.5 4.3 3.7 3.7 3.7 3.8  CL 101 103 107 109 106 103  CO2 $Re'30 27 27 26 26 29  'rNs$ GLUCOSE 145* 107* 114* 135* 146* 133*  BUN 43* 48* 32* 29* 37* 54*  CREATININE 2.61* 2.88* 3.25* 3.37* 3.32* 3.44*  CALCIUM 8.2* 8.2* 8.1* 8.2* 8.5* 8.4*  MG 2.0 1.9  --  1.8 1.7 1.8    CBG:  Recent Labs Lab 07/30/15 1424 07/30/15 1729 07/30/15 2112 07/31/15 0015 07/31/15 1226  GLUCAP 151* 119* 163* 122* 114*    GFR Estimated Creatinine Clearance: 17.4 mL/min (by C-G formula based on Cr of 3.44).  Coagulation profile No results for input(s): INR, PROTIME in the last 168 hours.  Cardiac Enzymes No results for input(s): CKMB, TROPONINI, MYOGLOBIN in the last 168 hours.  Invalid input(s): CK  Invalid input(s): POCBNP No results for input(s): DDIMER in the last 72 hours. No results for input(s): HGBA1C in the last 72 hours. No results for input(s): CHOL, HDL, LDLCALC, TRIG, CHOLHDL, LDLDIRECT in the last 72 hours. No results for input(s): TSH, T4TOTAL, T3FREE, THYROIDAB in the last 72 hours.  Invalid input(s): FREET3 No results for input(s): VITAMINB12, FOLATE, FERRITIN, TIBC, IRON, RETICCTPCT in the last 72 hours. No results for input(s): LIPASE, AMYLASE in the last 72 hours.  Urine Studies No results for input(s): UHGB, CRYS in the last 72 hours.  Invalid input(s): UACOL, UAPR, USPG, UPH, UTP, UGL, UKET, UBIL, UNIT, UROB, ULEU, UEPI, UWBC, URBC, UBAC, CAST, UCOM, BILUA  MICROBIOLOGY: No results found for this or any previous visit (from the past 240 hour(s)).  RADIOLOGY STUDIES/RESULTS: Ct Abdomen Pelvis Wo Contrast  07/13/2015  CLINICAL DATA:  Subsequent encounter for nausea  vomiting with low abdominal pain and increased drainage from fistula. EXAM: CT ABDOMEN AND PELVIS WITHOUT CONTRAST TECHNIQUE: Multidetector CT imaging of the abdomen and pelvis was performed following the standard protocol without IV contrast. COMPARISON:  06/04/2015. FINDINGS: Lower chest: Motion artifact with some atelectasis in the dependent bases. Hepatobiliary: No focal abnormality in the liver on this study without intravenous contrast. No evidence of hepatomegaly. Gallbladder is surgically absent. No intrahepatic or extrahepatic biliary dilation. Pancreas: No focal mass lesion. No dilatation of the main duct. No intraparenchymal cyst. No peripancreatic edema. Spleen: No splenomegaly. No focal mass lesion. Adrenals/Urinary Tract: No adrenal nodule or mass. No mass lesion evident in either kidney on this study without intravenous contrast material. No evidence for hydroureteronephrosis. The urinary bladder appears normal for the degree of distention. Stomach/Bowel: Small hiatal hernia noted.  Metallic foreign body identified along the posterior wall of the gastric fundus. This is been present on multiple prior studies is well. Duodenum is normally positioned as is the ligament of Treitz. No small bowel wall thickening. No small bowel dilatation. The terminal ileum is normal. The appendix is not visualized, but there is no edema or inflammation in the region of the cecum. Diverticuli are seen scattered along the entire length of the colon without CT findings of diverticulitis. Small bowel adhesions to the anterior abdominal wall again noted. There is soft tissue attenuation in the midline anterior abdominal wall, at the site of previous surgical wound. This is the area where enterocutaneous fistula was visualized on the previous study. No gas in this region today but no oral contrast was administered to allow assessment of the fistula. Vascular/Lymphatic: There is abdominal aortic atherosclerosis without  aneurysm. There is no gastrohepatic or hepatoduodenal ligament lymphadenopathy. No intraperitoneal or retroperitoneal lymphadenopy. No pelvic sidewall lymphadenopathy. Reproductive: Uterus is surgically absent. There is no adnexal mass. Other: No intraperitoneal free fluid. Musculoskeletal: Bone windows reveal no worrisome lytic or sclerotic osseous lesions. IMPRESSION: Stable exam. No new or acute interval findings. There is no gas in the region of the chronic enterocutaneous fistula no evidence to suggest interval development of an abscess in this region. Fistula not well assessed given the lack of oral contrast on today's study. No evidence for bowel obstruction. No intraperitoneal fluid collection and no intraperitoneal free fluid. Electronically Signed   By: Misty Stanley M.D.   On: 07/13/2015 13:27   Dg Chest 2 View  07/13/2015  CLINICAL DATA:  Emesis.  CHF. EXAM: CHEST  2 VIEW COMPARISON:  06/04/2015 chest radiograph FINDINGS: Right PICC terminates in the lower third of the superior vena cava. Stable cardiomediastinal silhouette with mild cardiomegaly. No pneumothorax. No pleural effusion. Clear lungs, with no focal lung consolidation and no pulmonary edema. IMPRESSION: Stable mild cardiomegaly without pulmonary edema. Lungs appear clear. Electronically Signed   By: Ilona Sorrel M.D.   On: 07/13/2015 12:43   US Abdomen Complete  07/21/2015  CLINICAL DATA:  Elevated liver function studies. EXAM: ULTRASOUND ABDOMEN COMPLETE COMPARISON:  Multiple prior ultrasound and CT examinations. FINDINGS: Gallbladder: Surgically absent Common bile duct: Diameter: 7.1 mm Liver: Normal echogenicity without focal lesion or biliary dilatation. IVC: Normal caliber Pancreas: Poorly visualized but normal on recent CT scans. Spleen: Normal size.  No focal lesions. Right Kidney: Length: 10.4 cm. Mild renal cortical thinning but no mass or hydronephrosis. Left Kidney: Length: 10.3 cm. Mild renal cortical thinning but no mass  or hydronephrosis. Abdominal aorta: Normal caliber Other findings: None. IMPRESSION: Status post cholecystectomy with mild associated common bile duct dilatation. Normal sonographic appearance of the liver. No intrahepatic biliary dilatation. Poor visualization of the pancreas but it was normal on recent CT scans. Electronically Signed   By: Marijo Sanes M.D.   On: 07/21/2015 23:43   US Renal  07/17/2015  CLINICAL DATA:  Renal insufficiency EXAM: RENAL ULTRASOUND COMPARISON:  Renal ultrasound June 08, 2015; CT abdomen and pelvis July 13, 2015 FINDINGS: Right Kidney: Length: 11.1 cm. Echogenicity and renal cortical thickness are within normal limits. No perinephric fluid or hydronephrosis visualized. There is a cyst in the upper pole region measuring 1.5 x 1.5 x 1.5 cm. No sonographically demonstrable calculus or ureterectasis. Left Kidney: Length: 10.8 cm. Echogenicity and renal cortical thickness are within normal limits. No mass, perinephric fluid, or hydronephrosis visualized. No sonographically demonstrable calculus. Bladder: Completely empty and cannot  be assessed. IMPRESSION: Cyst arising from upper pole right kidney. Study otherwise unremarkable. Note the urinary bladder is empty and cannot be assessed. Electronically Signed   By: Lowella Grip III M.D.   On: 07/17/2015 11:45   Ir Fluoro Guide Cv Line Right  07/27/2015  INDICATION: 74 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 Patient also has chronic kidney disease. Patient needs IV access for TPN and antibiotics. EXAM: FLUOROSCOPIC AND ULTRASOUND GUIDED PLACEMENT OF A TUNNELED CENTRAL VENOUS CATHETER Physician: Stephan Minister. Henn, MD FLUOROSCOPY TIME:  54 seconds, 23 mGy MEDICATIONS: 1 mg Versed, 25 mcg fentanyl. A radiology nurse monitored the patient for moderate sedation. ANESTHESIA/SEDATION: Moderate sedation time: 20 minutes PROCEDURE: Informed consent was obtained for placement of a tunneled central venous catheter. The patient was placed  supine on the interventional table. Ultrasound confirmed a patent right internal jugularvein. Ultrasound images were obtained for documentation. The right side of the neck was prepped and draped in a sterile fashion. The right side of the neck was anesthetized with 1% lidocaine. Maximal barrier sterile technique was utilized including caps, mask, sterile gowns, sterile gloves, sterile drape, hand hygiene and skin antiseptic. A small incision was made with #11 blade scalpel. A 21 gauge needle directed into the right internal jugular vein with ultrasound guidance. A micropuncture dilator set was placed. A dual lumen Powerline catheter was selected. The skin below the right clavicle was anesthetized and a small incision was made with an #11 blade scalpel. A subcutaneous tunnel was formed to the vein dermatotomy site. The catheter was brought through the tunnel and cut to 22 cm. The vein dermatotomy site was dilated to accommodate a peel-away sheath. The catheter was placed through the peel-away sheath and directed into the central venous structures. The tip of the catheter was placed at the superior cavoatrial junction with fluoroscopy. Fluoroscopic images were obtained for documentation. Both lumens were found to aspirate and flush well. The vein dermatotomy site was closed using a single layer of absorbable suture and Dermabond. The catheter was secured to the skin using Prolene suture. FINDINGS: Catheter tip at the superior cavoatrial junction. Estimated blood loss: Minimal COMPLICATIONS: None IMPRESSION: Successful placement of a right jugular tunneled central venous catheter using ultrasound and fluoroscopic guidance. Electronically Signed   By: Markus Daft M.D.   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 Patient also has chronic kidney disease. Patient needs IV access for TPN and antibiotics. EXAM: FLUOROSCOPIC AND ULTRASOUND GUIDED  PLACEMENT OF A TUNNELED CENTRAL VENOUS CATHETER Physician: Stephan Minister. Henn, MD FLUOROSCOPY TIME:  54 seconds, 23 mGy MEDICATIONS: 1 mg Versed, 25 mcg fentanyl. A radiology nurse monitored the patient for moderate sedation. ANESTHESIA/SEDATION: Moderate sedation time: 20 minutes PROCEDURE: Informed consent was obtained for placement of a tunneled central venous catheter. The patient was placed supine on the interventional table. Ultrasound confirmed a patent right internal jugularvein. Ultrasound images were obtained for documentation. The right side of the neck was prepped and draped in a sterile fashion. The right side of the neck was anesthetized with 1% lidocaine. Maximal barrier sterile technique was utilized including caps, mask, sterile gowns, sterile gloves, sterile drape, hand hygiene and skin antiseptic. A small incision was made with #11 blade scalpel. A 21 gauge needle directed into the right internal jugular vein with ultrasound guidance. A micropuncture dilator set was placed. A dual lumen Powerline catheter was selected. The skin below the right clavicle was anesthetized and a small  incision was made with an #11 blade scalpel. A subcutaneous tunnel was formed to the vein dermatotomy site. The catheter was brought through the tunnel and cut to 22 cm. The vein dermatotomy site was dilated to accommodate a peel-away sheath. The catheter was placed through the peel-away sheath and directed into the central venous structures. The tip of the catheter was placed at the superior cavoatrial junction with fluoroscopy. Fluoroscopic images were obtained for documentation. Both lumens were found to aspirate and flush well. The vein dermatotomy site was closed using a single layer of absorbable suture and Dermabond. The catheter was secured to the skin using Prolene suture. FINDINGS: Catheter tip at the superior cavoatrial junction. Estimated blood loss: Minimal COMPLICATIONS: None IMPRESSION: Successful placement of a  right jugular tunneled central venous catheter using ultrasound and fluoroscopic guidance. Electronically Signed   By: Markus Daft M.D.   On: 07/27/2015 17:40   Dg Chest Port 1 View  07/25/2015  CLINICAL DATA:  PICC line placement EXAM: PORTABLE CHEST 1 VIEW COMPARISON:  07/13/2015 FINDINGS: Left PICC line tip is at the innominate SVC junction. This could be advanced 5 cm to the SVC RA junction. Normal heart size and vascularity. Minor streaky left base atelectasis. Right lung remains clear. No edema, effusion, or pneumothorax. Trachea midline. Atherosclerosis of the aorta. IMPRESSION: Left PICC line tip at the innominate SVC junction and can be advanced 5 cm to the SVC RA junction. Left base atelectasis Thoracic atherosclerosis Electronically Signed   By: Jerilynn Mages.  Shick M.D.   On: 07/25/2015 12:07   Dg Ugi W/small Bowel  07/20/2015  CLINICAL DATA:  75 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 Prior repair. Question level of fistula. Subsequent encounter. EXAM: UPPER GI SERIES WITH SMALL BOWEL FOLLOW-THROUGH FLUOROSCOPY TIME:  Radiation Exposure Index (as provided by the fluoroscopic device): 1634.12 micro Gy TECHNIQUE: Water-soluble upper GI series followed by small bowel were obtained after discussion with Dr. Kieth Brightly. COMPARISON:  None. FINDINGS: Draining clips are noted on scout view. Patient was only able to ingest a small amount of Omnipaque 300 at one time. Total of 150 cc was ingested. The present examination was specifically tailored to evaluate for level of the enterocutaneous fistula. Gastroesophageal reflux noted. Patient was kept in the upright position to avoid aspiration. Scattered duodenal diverticulum. Nonspecific eggshell calcification right upper quadrant. Enterocutaneous fistulas best seen on lateral overhead imaging. This appears to arise from the proximal to mid ileum. Distal aspect of the small bowel not visualized. Patient was tired of the exam at this point. Study terminated.  IMPRESSION: Enterocutaneous fistulas best seen on lateral overhead imaging. This appears to arise from the proximal to mid ileum. Exam otherwise limited as noted above. Electronically Signed   By: Genia Del M.D.   On: 07/20/2015 15:46    Oren Binet, MD  [facility] Pager:[phone]  If 7PM-7AM, please contact night-coverage [email or web address] Password [email or web address] 07/31/2015, 2:36 PM   LOS: 18 days

## 2015-07-31 NOTE — Consult Note (Signed)
Consultation Note Date: 07/31/2015   Patient Name: Marissa [name] [name]  DOB: March 20, 1941  MRN: [id number]  Age / Sex: 74 y.o., female  PCP: Marissa Lima, MD Referring Physician: Jonetta Osgood, MD  Reason for Consultation: Establishing goals of care, Non pain symptom management and Pain control    Clinical Assessment/Narrative: Pt is a 74 yo female with a h/o enterocutaneous fistula. She was adm from SNF after increased biliary drainage and worsening kidney function. She has been on TNA thru PICC which had to be removed because of infection. She now has a PAC. Despite antibiotics, fluids, pt has been exhibiting a FTT picture. Her kidney function appeared initially to be improving but now,we are seeing an increase in creatinine. Pt has been reluctant to participate in PT. Feels overwhelmed with "everyone telling me I have to get up". Per medical team , surgery is the only resolution of fistula but pt is too weak to undergo surgery as well as having a pre-albumin level of 11. Family feels that " she wold want to cont to fight", and question whether depression has made it difficult to rally. She has taken lexapro in the past and per daughter this appears to help. . Family recognizes that they can no longer care for her at home with her current care needs and weakness.  Contacts/Participants in Discussion: Son, DIL, and dtr Primary Decision Maker:Pt with family assist. Daughter Marissa [name] [name] has been the primary decision aker as well as caring for her in her home in the past Relationship to Patient daughter HCPOA: no    SUMMARY OF RECOMMENDATIONS Cont Marissa [name] Cont TNA, antibiotics if indicated Introduced hospice as well as comfort care concepts Family wishes to pursue placement or LTAC to see if she can rally and get strong enough for surgery They do show insight into the medical picture that she may not be able to do this , but  feel trying is what she would want to do at this point   Code Status/Advance Care Planning: Marissa [name]    Code Status Orders        Start     Ordered   07/13/15 [place]  Do not attempt resuscitation (Marissa [name])   Continuous    Question Answer Comment  In the event of cardiac or respiratory ARREST Do not call a "code blue"   In the event of cardiac or respiratory ARREST Do not perform Intubation, CPR, defibrillation or ACLS   In the event of cardiac or respiratory ARREST Use medication by any route, position, wound care, and other measures to relive pain and suffering. May use oxygen, suction and manual treatment of airway obstruction as needed for comfort.      07/13/15 1834      Other Directives:None  Symptom Management:   Pain: Will add scheduled tylenol. Pt used this at home and feels comfortable with this. Will dc morphine. Order low dose dilaudid for moderate to severe pain  Depression with anxiety component: Re start lexapro 10 mg daily  Fatigue: Add modafinil 50 mg qam to see if this will improve energy and mood and thus enable her to participate in therapy.  Palliative Prophylaxis:   Bowel Regimen, Frequent Pain Assessment and Turn Reposition  Additional Recommendations (Limitations, Scope, Preferences):  Full Scope Treatment except for Marissa [name]   Psycho-social/Spiritual:  Support System: Strong Desire for further Chaplaincy support:no Additional Recommendations: none  Prognosis: Unable to determine  Discharge Planning: [facility] for rehab with Palliative care service follow-up  Chief Complaint/ Primary Diagnoses: Present on Admission:  . Dehydration . Acute on chronic renal failure ([place]) . Metabolic acidosis . Urinary tract infection . Oral thrush . OSA (obstructive sleep apnea) . Hyponatremia . Essential hypertension . Enterocutaneous fistula . Chronic kidney disease, stage 3 . Chronic diastolic CHF (congestive heart failure) ([place])  I have  reviewed the medical record, interviewed the patient and family, and examined the patient. The following aspects are pertinent.  Past Medical History  Diagnosis Date  . Hypertension   . Hyperlipidemia   . History of DVT (deep vein thrombosis)   . History of colonic polyps   . Gout   . Polyarthritis   . Microcytic anemia   . GERD (gastroesophageal reflux disease)   . Esophagitis   . Hidradenitis suppurativa     s/p axillary sweat gland removal  . H/O blood transfusion reaction     at [facility] hospital  . Diverticulitis   . Difficulty sleeping   . PMB (postmenopausal bleeding)     X 2 YRS  . Bowel trouble     OCCASIONAL BOWEL INCONTINENCE  . Fibroids     s/p TAH/BSO 07/2012  . CAD (coronary artery disease)     a. 8/07 had BMS to OM.  b. In-stent restenosis with later Promus DES to same site.  c. Marissa [name] [name] myoview in 1/13 showed EF 67%, no ischemia or infarction.  d. Echo (2/13) showed EF 55-60%, moderate LVH, mild MR.  e. Lex MV 6/14: no isch, EF 53%;  f. Echo 6/14: mild LVH, EF 55-60%, Gr 1 DD, MAC, mild MR, mild LAE  . Peripheral neuropathy ([place])   . Chronic diastolic CHF (congestive heart failure) ([place]) 06/29/2014  . Renal insufficiency     baseline Cr ~ 1.3  . Chronic kidney disease (CKD), stage III (moderate)     Archie Endo 04/27/2015  . OSA on CPAP     "wear it sometimes" (04/27/2015)  . History of hiatal hernia   . [id number])     "weekly" (04/27/2015)  . Enterocutaneous fistula     "have had it 2-3 months now" 04/27/2015  . Type II diabetes mellitus (HCC)     diet controlled: pt's daughter denies mother's hx of diabetes   Social History   Social History  . Marital Status: Widowed    Spouse Name: N/A  . Number of Children: 5  . Years of Education: 12   Occupational History  . Retired    Social History Main Topics  . Smoking status: Never Smoker   . Smokeless tobacco: Never Used  . Alcohol Use: No  . Drug Use: No  . Sexual Activity: No   Other Topics Concern    . None   Social History Narrative   Widowed. Lives with daughter.   5 living children, 1 deceased.   Retired, used to work in Public relations account executive.   12th grade high school education.   Has Medicaid.         Family History  Problem Relation Age of Onset  . Diabetes Mother   . Coronary artery disease Mother   . Diabetes Sister   . Coronary artery disease Sister    Scheduled Meds: . furosemide  40 mg Intravenous Q12H  . heparin subcutaneous  5,000 Units Subcutaneous 3 times per day  . insulin aspart  0-9 Units Subcutaneous 4 times per day  . loperamide  2 mg Oral 4 times per day  . nystatin  5 mL Oral QID  .  octreotide  100 mcg Subcutaneous Q12H  . pantoprazole (PROTONIX) IV  40 mg Intravenous Q24H   Continuous Infusions: . TPN (CLINIMIX) Adult without lytes     And  . fat emulsion     PRN Meds:.diphenhydrAMINE, diphenhydrAMINE-zinc acetate, Glycerin (Adult), hydrOXYzine, iohexol, morphine injection, nitroGLYCERIN, ondansetron **OR** ondansetron (ZOFRAN) IV, oxyCODONE, sodium chloride, sodium chloride Medications Prior to Admission:  Prior to Admission medications   Medication Sig Start Date End Date Taking? Authorizing Provider  acetaminophen (TYLENOL) 325 MG tablet Take 650 mg by mouth every 6 (six) hours as needed (general discomfort).    Yes Historical Provider, MD  colchicine 0.6 MG tablet Take 0.6 mg by mouth daily.   Yes Historical Provider, MD  cyanocobalamin 2000 MCG tablet Take 1 tablet (2,000 mcg total) by mouth daily. 01/07/15  Yes Marissa Lima, MD  diphenhydrAMINE (BENADRYL) 25 mg capsule Take 25 mg by mouth every 4 (four) hours as needed for itching.   Yes Historical Provider, MD  FeAsp-B12-FA-C-DSS-SuccAc-Zn (FERIVA 21/7) 75-1 MG TABS Take 1 tablet by mouth daily. 04/15/15  Yes Marissa Lima, MD  indomethacin (INDOCIN) 50 MG capsule Take 50 mg by mouth 3 (three) times daily with meals. For 4 days   Yes Historical Provider, MD  insulin aspart (NOVOLOG) 100  UNIT/ML injection Sliding scale  CBG 70 - 120: 0 units: CBG 121 - 150: 2 units; CBG 151 - 200: 3 units; CBG 201 - 250: 5 units; CBG 251 - 300: 8 units;CBG 301 - 350: 11 units; CBG 351 - 400: 15 units; CBG > 400 : 15 units and notify MD Patient taking differently: Inject 2-15 Units into the skin 3 (three) times daily with meals. Sliding scale  CBG 70 - 120: 0 units: CBG 121 - 150: 2 units; CBG 151 - 200: 3 units; CBG 201 - 250: 5 units; CBG 251 - 300: 8 units;CBG 301 - 350: 11 units; CBG 351 - 400: 15 units; CBG > 400 : 15 units and notify MD 06/15/15  Yes Ripudeep K Rai, MD  nystatin (MYCOSTATIN) 100000 UNIT/ML suspension Take 5 mLs (500,000 Units total) by mouth 4 (four) times daily. 06/15/15  Yes Ripudeep Krystal Eaton, MD  octreotide (SANDOSTATIN) 100 MCG/ML SOLN injection Inject 1 mL (100 mcg total) into the skin every 12 (twelve) hours. 06/15/15  Yes Ripudeep Krystal Eaton, MD  oxycodone (OXY-IR) 5 MG capsule Take 5 mg by mouth every 6 (six) hours as needed (moderate to severe pain).    Yes Historical Provider, MD  pantoprazole (PROTONIX) 40 MG tablet Take 1 tablet (40 mg total) by mouth daily. 12/25/14  Yes Reyne Dumas, MD  traMADol (ULTRAM) 50 MG tablet Take 50 mg by mouth every 6 (six) hours as needed (pain).   Yes Historical Provider, MD  UNABLE TO FIND ADULT TPN per skilled nursing facility protocol QHS/every night (untreated discontinued by surgery). Patient taking differently: Inject 2,040 mLs into the vein every other day. ADULT TPN per skilled nursing facility protocol. Infuse over 12 hours 06/15/15  Yes Ripudeep K Rai, MD  VOLTAREN 1 % GEL Apply 2 g topically 4 (four) times daily as needed (pain).  12/25/14  Yes Historical Provider, MD  methocarbamol (ROBAXIN) 500 MG tablet Take 1 tablet (500 mg total) by mouth every 8 (eight) hours as needed for muscle spasms. 05/21/15   Saverio Danker, PA-C  NITROSTAT 0.4 MG SL tablet DISSOLVE ONE TABLET UNDER THE TONGUE EVERY 5 MINUTES AS NEEDED FOR CHEST PAIN.  DO NOT EXCEED A  TOTAL OF 3 DOSES IN 15 MINUTES 03/30/15   Larey Dresser, MD   No Known Allergies  Review of Systems  Constitutional: Positive for activity change, appetite change and fatigue.  HENT: Negative.   Eyes: Negative.   Respiratory: Negative.   Cardiovascular: Negative.   Endocrine: Negative.   Genitourinary: Positive for urgency.  Neurological: Positive for weakness.  Hematological: Negative.   Psychiatric/Behavioral:       Anxious, overwhelmed    Physical Exam  Constitutional: She is oriented to person, place, and time. She appears well-developed.  HENT:  Head: Normocephalic.  Neck: Normal range of motion.  GI: Soft. There is tenderness.  Open draining fistula, skin excoriated  Musculoskeletal:  Feels as though she cannot move her legs; too weak  Neurological: She is alert and oriented to person, place, and time.  Skin:  Skin across abdomen excoriated with areas of breakdown  Psychiatric:  Anxious, concentration poor    Vital Signs: BP 106/55 mmHg  Pulse 88  Temp(Src) 98.4 F (36.9 C) (Oral)  Resp 19  Ht 5\' 4"  (1.626 m)  Wt 109.362 kg (241 lb 1.6 oz)  BMI 41.36 kg/m2  SpO2 97%  SpO2: SpO2: 97 % O2 Device:SpO2: 97 % O2 Flow Rate: .O2 Flow Rate (L/min): 2 L/min  IO: Intake/output summary:  Intake/Output Summary (Last 24 hours) at 07/31/15 1223 Last data filed at 07/31/15 [id number]  Gross per 24 hour  Intake     10 ml  Output    450 ml  Net   -440 ml    LBM: Last BM Date: 07/26/15 Baseline Weight: Weight: 94.303 kg (207 lb 14.4 oz) Most recent weight: Weight: 109.362 kg (241 lb 1.6 oz)      Palliative Assessment/Data:  Flowsheet Rows        Most Recent Value   Intake Tab    Referral Department  Hospitalist   Unit at Time of Referral  Med/Surg Unit   Palliative Care Primary Diagnosis  Sepsis/Infectious Disease   Date Notified  07/29/15   Palliative Care Type  New Palliative care   Reason for referral  Pain, Psychosocial or Spiritual support, Clarify Goals of  Care   Date of Admission  07/13/15   Date first seen by Palliative Care  07/31/15   # of days Palliative referral response time  2 Day(s)   # of days IP prior to Palliative referral  16   Clinical Assessment    Palliative Performance Scale Score  30%   Pain Max last 24 hours  Not able to report   Pain Min Last 24 hours  Not able to report   Dyspnea Max Last 24 Hours  Not able to report   Dyspnea Min Last 24 hours  Not able to report   Nausea Max Last 24 Hours  Not able to report   Nausea Min Last 24 Hours  Not able to report   Anxiety Max Last 24 Hours  Not able to report   Anxiety Min Last 24 Hours  Not able to report   Other Max Last 24 Hours  Not able to report   Psychosocial & Spiritual Assessment    Social Work Plan of Care  Offered and declined   Palliative Care Outcomes    Patient/Family meeting held?  Yes   Palliative Care Outcomes  Improved pain interventions, Clarified goals of care   Patient/Family wishes: Interventions discontinued/not started   Antibiotics, Tube feedings/TPN   Palliative Care follow-up planned  Yes, Facility  Other Treatment Preference Instructions  wants LTAC if possible      Additional Data Reviewed:  CBC:    Component Value Date/Time   WBC 5.6 07/30/2015 0603   HGB 8.8* 07/30/2015 0603   HCT 27.9* 07/30/2015 0603   PLT 218 07/30/2015 0603   MCV 79.7 07/30/2015 0603   NEUTROABS 5.4 07/20/2015 1020   LYMPHSABS 1.0 07/20/2015 1020   MONOABS 0.9 07/20/2015 1020   EOSABS 0.5 07/20/2015 1020   BASOSABS 0.1 07/20/2015 1020   Comprehensive Metabolic Panel:    Component Value Date/Time   NA 139 07/31/2015 0440   K 3.8 07/31/2015 0440   CL 103 07/31/2015 0440   CO2 29 07/31/2015 0440   BUN 54* 07/31/2015 0440   CREATININE 3.44* 07/31/2015 0440   CREATININE 1.64* 06/27/2014 1647   GLUCOSE 133* 07/31/2015 0440   CALCIUM 8.4* 07/31/2015 0440   AST 17 07/30/2015 0603   ALT 25 07/30/2015 0603   ALKPHOS 83 07/30/2015 0603   BILITOT 0.4  07/30/2015 0603   PROT 5.6* 07/30/2015 0603   ALBUMIN 1.9* 07/30/2015 0603     Time In: 1030 Time Out: 1200 Time Total: 90 min Greater than 50%  of this time was spent counseling and coordinating care related to the above assessment and plan. Staffed with Dr. Sloan Leiter. Will cont to follow over the weekend  Signed by: Dory Horn, NP  Dory Horn, NP  07/31/2015, 12:23 PM  Please contact Palliative Medicine Team phone at [phone] for questions and concerns.

## 2015-07-31 NOTE — Consult Note (Addendum)
WOC wound follow up Current pouch is leaking behind the barrier to lower abd area, was applied yesterday. Wound type: Full thickness wound to midline abd with fistula, has decreased in size since admission.  Wound bed: 100% red.  There are also 2 full thickness wounds located above the fistula site, each is .2X.2X.1cm, red and moist without odor, small amt yellow drainage.  Applied piece of [place] over wounds to protect. Drainage (amount, consistency, odor) Mod amt green drainage in cannister Periwound: Intact skin surrounding wound is very moist and weeping from unknown etiology despite drying and crusting attempts. It is difficult to maintain a pouch seal related to constant moisture. Dressing procedure/placement/frequency: Applied skin prep to protect skin, then changed to small Eakin pouch with red rubber catheter to low wall suction to control drainage and promote healing. [facility] team will continue to follow. Supplies at bedside and instructions provided for staff nurses if leakage occurs. Daughter has managed the fistula and pouching routines at home before, I feel that she would be able to perform these tasks again if the patient is discharged. Julien Girt MSN, RN, [place], [place], [place]

## 2015-07-31 NOTE — Care Management (Addendum)
[place] with [facility] [phone] called . She will be Ms Dineen's case Freight forwarder at [facility] now. She confirmed same as Helene Kelp . If patient has SNF bed offer LTAC will be denied. Mendel Ryder will start SNF authorization today and call SW Lorriane Shire.  Magdalen Spatz RN BSN    Received message that patient and daughter [name] interested in [facility] again .   Spoke to [facility] Case Freight forwarder at [facility] , same as in September , if patient has SNF bed offers LTAC will be denied .   Explained same to [place] . [name] voiced understanding and is wanting her mother to return to [facility]  Rm 125 . Lorriane Shire with SW will call [facility] regarding patient's needs and if they are still offering a bed.   Aaron Edelman with [facility] and Seth Bake with [facility] both aware.   Magdalen Spatz RN BSN [phone]

## 2015-07-31 NOTE — Progress Notes (Signed)
Patient ID: Marissa [name], female   DOB: 1941-02-06, 74 y.o.   MRN: [id number]     [facility]      [address] [place], [place] [address]    Phone: [phone] FAX: [phone]     Subjective:  1101ml output recorded yesterday. abd pain is okay.   Worked with PT yesterday albeit not much was achieved.   Objective:  Vital signs:  Filed Vitals:   07/30/15 1100 07/30/15 1409 07/30/15 2113 07/31/15 0608  BP: 117/55 109/62 100/52 106/55  Pulse: 97 94 94 88  Temp: 98.4 F (36.9 C) 98.2 F (36.8 C) 98.3 F (36.8 C) 98.4 F (36.9 C)  TempSrc: Oral Oral Oral Oral  Resp: $Remo'15 15 19 19  'lHViW$ Height:      Weight:      SpO2: 100% 98% 98% 97%    Last BM Date: 07/26/15  Intake/Output   Yesterday:  11/17 0701 - 11/18 0700 In: 10 [I.V.:10] Out: 150 [Drains:150] This shift:    I/O last 3 completed shifts: In: 10 [I.V.:10] Out: 650 [Urine:400; Drains:250] Total I/O In: -  Out: 300 [Urine:300]   Physical Exam: General: Pt awake/alert/oriented x4 in no acute distress  Abdomen: Soft. Nondistended. eakin's pouch in place with red rubber catheter to suction. Non tender.    Problem List:   Principal Problem:   Acute on chronic renal failure (HCC) Active Problems:   OSA (obstructive sleep apnea)   Essential hypertension   Chronic kidney disease, stage 3   Chronic diastolic CHF (congestive heart failure) (HCC)   Enterocutaneous fistula   Type 2 diabetes mellitus without complication (HCC)   Hyponatremia   Dehydration   Metabolic acidosis   Urinary tract infection   Oral thrush   Acute kidney injury ([place])    Results:   Labs: Results for orders placed or performed during the hospital encounter of 07/13/15 (from the past 48 hour(s))  Glucose, capillary     Status: Abnormal   Collection Time: 07/29/15  1:10 PM  Result Value Ref Range   Glucose-Capillary 116 (H) 65 - 99 mg/dL  Glucose, capillary     Status: Abnormal    Collection Time: 07/29/15  4:36 PM  Result Value Ref Range   Glucose-Capillary 127 (H) 65 - 99 mg/dL  Urinalysis, Routine w reflex microscopic (not at [facility])     Status: Abnormal   Collection Time: 07/29/15  9:56 PM  Result Value Ref Range   Color, Urine YELLOW YELLOW   APPearance CLOUDY (A) CLEAR   Specific Gravity, Urine 1.007 1.005 - 1.030   pH 5.0 5.0 - 8.0   Glucose, UA NEGATIVE NEGATIVE mg/dL   Hgb urine dipstick TRACE (A) NEGATIVE   Bilirubin Urine NEGATIVE NEGATIVE   Ketones, ur NEGATIVE NEGATIVE mg/dL   Protein, ur NEGATIVE NEGATIVE mg/dL   Nitrite NEGATIVE NEGATIVE   Leukocytes, UA SMALL (A) NEGATIVE  Urine microscopic-add on     Status: Abnormal   Collection Time: 07/29/15  9:56 PM  Result Value Ref Range   Squamous Epithelial / LPF 6-30 (A) NONE SEEN    Comment: Please note change in reference range.   WBC, UA 0-5 0 - 5 WBC/hpf    Comment: Please note change in reference range.   RBC / HPF 0-5 0 - 5 RBC/hpf    Comment: Please note change in reference range.   Bacteria, UA RARE (A) NONE SEEN    Comment: Please note change in reference range.  Glucose, capillary     Status: Abnormal   Collection Time: 07/30/15 12:03 AM  Result Value Ref Range   Glucose-Capillary 148 (H) 65 - 99 mg/dL  CBC     Status: Abnormal   Collection Time: 07/30/15  6:03 AM  Result Value Ref Range   WBC 5.6 4.0 - 10.5 K/uL   RBC 3.50 (L) 3.87 - 5.11 MIL/uL   Hemoglobin 8.8 (L) 12.0 - 15.0 g/dL   HCT 27.9 (L) 36.0 - 46.0 %   MCV 79.7 78.0 - 100.0 fL   MCH 25.1 (L) 26.0 - 34.0 pg   MCHC 31.5 30.0 - 36.0 g/dL   RDW 15.2 11.5 - 15.5 %   Platelets 218 150 - 400 K/uL  Comprehensive metabolic panel     Status: Abnormal   Collection Time: 07/30/15  6:03 AM  Result Value Ref Range   Sodium 139 135 - 145 mmol/L   Potassium 3.7 3.5 - 5.1 mmol/L   Chloride 106 101 - 111 mmol/L   CO2 26 22 - 32 mmol/L   Glucose, Bld 146 (H) 65 - 99 mg/dL   BUN 37 (H) 6 - 20 mg/dL   Creatinine, Ser 3.32 (H) 0.44 -  1.00 mg/dL   Calcium 8.5 (L) 8.9 - 10.3 mg/dL   Total Protein 5.6 (L) 6.5 - 8.1 g/dL   Albumin 1.9 (L) 3.5 - 5.0 g/dL   AST 17 15 - 41 U/L   ALT 25 14 - 54 U/L   Alkaline Phosphatase 83 38 - 126 U/L   Total Bilirubin 0.4 0.3 - 1.2 mg/dL   GFR calc non Af Amer 13 (L) >60 mL/min   GFR calc Af Amer 15 (L) >60 mL/min    Comment: (NOTE) The eGFR has been calculated using the CKD EPI equation. This calculation has not been validated in all clinical situations. eGFR's persistently <60 mL/min signify possible Chronic Kidney Disease.    Anion gap 7 5 - 15  Magnesium     Status: None   Collection Time: 07/30/15  6:03 AM  Result Value Ref Range   Magnesium 1.7 1.7 - 2.4 mg/dL  Phosphorus     Status: Abnormal   Collection Time: 07/30/15  6:03 AM  Result Value Ref Range   Phosphorus 5.2 (H) 2.5 - 4.6 mg/dL  Glucose, capillary     Status: Abnormal   Collection Time: 07/30/15  6:24 AM  Result Value Ref Range   Glucose-Capillary 150 (H) 65 - 99 mg/dL  Glucose, capillary     Status: Abnormal   Collection Time: 07/30/15  7:41 AM  Result Value Ref Range   Glucose-Capillary 157 (H) 65 - 99 mg/dL  Glucose, capillary     Status: Abnormal   Collection Time: 07/30/15  2:24 PM  Result Value Ref Range   Glucose-Capillary 151 (H) 65 - 99 mg/dL  Glucose, capillary     Status: Abnormal   Collection Time: 07/30/15  5:29 PM  Result Value Ref Range   Glucose-Capillary 119 (H) 65 - 99 mg/dL  Glucose, capillary     Status: Abnormal   Collection Time: 07/30/15  9:12 PM  Result Value Ref Range   Glucose-Capillary 163 (H) 65 - 99 mg/dL  Glucose, capillary     Status: Abnormal   Collection Time: 07/31/15 12:15 AM  Result Value Ref Range   Glucose-Capillary 122 (H) 65 - 99 mg/dL  Basic metabolic panel     Status: Abnormal   Collection Time: 07/31/15  4:40 AM  Result Value Ref Range   Sodium 139 135 - 145 mmol/L   Potassium 3.8 3.5 - 5.1 mmol/L   Chloride 103 101 - 111 mmol/L   CO2 29 22 - 32 mmol/L    Glucose, Bld 133 (H) 65 - 99 mg/dL   BUN 54 (H) 6 - 20 mg/dL   Creatinine, Ser 6.19 (H) 0.44 - 1.00 mg/dL   Calcium 8.4 (L) 8.9 - 10.3 mg/dL   GFR calc non Af Amer 12 (L) >60 mL/min   GFR calc Af Amer 14 (L) >60 mL/min    Comment: (NOTE) The eGFR has been calculated using the CKD EPI equation. This calculation has not been validated in all clinical situations. eGFR's persistently <60 mL/min signify possible Chronic Kidney Disease.    Anion gap 7 5 - 15  Magnesium     Status: None   Collection Time: 07/31/15  4:40 AM  Result Value Ref Range   Magnesium 1.8 1.7 - 2.4 mg/dL  Phosphorus     Status: Abnormal   Collection Time: 07/31/15  4:40 AM  Result Value Ref Range   Phosphorus 6.1 (H) 2.5 - 4.6 mg/dL    Imaging / Studies: No results found.  Medications / Allergies:  Scheduled Meds: . furosemide  40 mg Intravenous Q12H  . heparin subcutaneous  5,000 Units Subcutaneous 3 times per day  . insulin aspart  0-9 Units Subcutaneous 4 times per day  . loperamide  2 mg Oral 4 times per day  . nystatin  5 mL Oral QID  . octreotide  100 mcg Subcutaneous Q12H  . pantoprazole (PROTONIX) IV  40 mg Intravenous Q24H   Continuous Infusions: . Marland KitchenTPN (CLINIMIX-E) Adult 119 mL/hr at 07/30/15 1915   And  . fat emulsion 234 mL (07/30/15 1804)   PRN Meds:.diphenhydrAMINE, diphenhydrAMINE-zinc acetate, Glycerin (Adult), hydrocerin, hydrOXYzine, iohexol, morphine injection, nitroGLYCERIN, ondansetron **OR** ondansetron (ZOFRAN) IV, oxyCODONE, sodium chloride, sodium chloride  Antibiotics: Anti-infectives    Start     Dose/Rate Route Frequency Ordered Stop   07/27/15 1557  ceFAZolin (ANCEF) 2-3 GM-% IVPB SOLR  Status:  Discontinued    Comments:  Victorino Dike   : cabinet override      07/27/15 1557 07/27/15 1619   07/23/15 1000  vancomycin (VANCOCIN) 1,250 mg in sodium chloride 0.9 % 250 mL IVPB  Status:  Discontinued     1,250 mg 166.7 mL/hr over 90 Minutes Intravenous Every 48 hours  07/23/15 0908 07/28/15 1353   07/16/15 1000  vancomycin (VANCOCIN) 1,500 mg in sodium chloride 0.9 % 500 mL IVPB  Status:  Discontinued     1,500 mg 250 mL/hr over 120 Minutes Intravenous Every 48 hours 07/14/15 0956 07/15/15 1035   07/16/15 1000  vancomycin (VANCOCIN) IVPB 1000 mg/200 mL premix  Status:  Discontinued     1,000 mg 200 mL/hr over 60 Minutes Intravenous Every 48 hours 07/15/15 1035 07/20/15 1242   07/14/15 1500  cefTRIAXone (ROCEPHIN) 1 g in dextrose 5 % 50 mL IVPB  Status:  Discontinued     1 g 100 mL/hr over 30 Minutes Intravenous Every 24 hours 07/13/15 1647 07/17/15 1113   07/14/15 1015  vancomycin (VANCOCIN) 1,500 mg in sodium chloride 0.9 % 500 mL IVPB     1,500 mg 250 mL/hr over 120 Minutes Intravenous NOW 07/14/15 0943 07/14/15 1210   07/13/15 1400  cefTRIAXone (ROCEPHIN) 1 g in dextrose 5 % 50 mL IVPB     1 g 100 mL/hr over 30 Minutes Intravenous  Once 07/13/15 1359 07/13/15 1526       Assessment/Plan ECF-eakin's pouch, appreciate WOC management. -accurate I&Os -sandostatin/loperamide to slow down transit -cont strict NPO, 180ml out yesterday -SBFT did show EC fistula to be fairly distal in the proximal to mid ileum -pt is also very deconditioned, needs to be optimized nutritionally and physically before any surgical consideration.  -cont TPN FEN-NPO. Previous PO trials have resulted in increased ECF output, dehydration, renal failure and readmission  PCM- prealbumin is 11.9. TPN Acute on chronic renal failure- per medicine VTE prophylaxis-SCD/heparin  Dispo-palliative care to meet with the family. Spoke with daughter, agreeable with SNF if she needs suction for the ECF output. can also try to have CM arrange portable suction at home since daughter's preference is to take the patient home.      Erby Pian, [facility] Pager [phone]) For consults and floor pages call [phone]-[phone])  07/31/2015 8:14  AM

## 2015-08-01 LAB — GLUCOSE, CAPILLARY
GLUCOSE-CAPILLARY: 150 mg/dL — AB (ref 65–99)
GLUCOSE-CAPILLARY: 84 mg/dL (ref 65–99)
Glucose-Capillary: 113 mg/dL — ABNORMAL HIGH (ref 65–99)
Glucose-Capillary: 147 mg/dL — ABNORMAL HIGH (ref 65–99)
Glucose-Capillary: 173 mg/dL — ABNORMAL HIGH (ref 65–99)
Glucose-Capillary: 176 mg/dL — ABNORMAL HIGH (ref 65–99)

## 2015-08-01 LAB — MAGNESIUM: Magnesium: 2 mg/dL (ref 1.7–2.4)

## 2015-08-01 LAB — BASIC METABOLIC PANEL
Anion gap: 8 (ref 5–15)
BUN: 64 mg/dL — AB (ref 6–20)
CHLORIDE: 99 mmol/L — AB (ref 101–111)
CO2: 29 mmol/L (ref 22–32)
CREATININE: 3.46 mg/dL — AB (ref 0.44–1.00)
Calcium: 8.2 mg/dL — ABNORMAL LOW (ref 8.9–10.3)
GFR calc Af Amer: 14 mL/min — ABNORMAL LOW (ref >60)
GFR calc non Af Amer: 12 mL/min — ABNORMAL LOW (ref >60)
Glucose, Bld: 163 mg/dL — ABNORMAL HIGH (ref 65–99)
POTASSIUM: 3.7 mmol/L (ref 3.5–5.1)
Sodium: 136 mmol/L (ref 135–145)

## 2015-08-01 LAB — PHOSPHORUS: PHOSPHORUS: 4.6 mg/dL (ref 2.5–4.6)

## 2015-08-01 MED ORDER — FAT EMULSION 20 % IV EMUL
234.0000 mL | INTRAVENOUS | Status: AC
  Administered 2015-08-01: 234 mL via INTRAVENOUS
  Filled 2015-08-01: qty 250

## 2015-08-01 MED ORDER — TRACE MINERALS CR-CU-MN-SE-ZN 10-1000-500-60 MCG/ML IV SOLN
INTRAVENOUS | Status: AC
  Administered 2015-08-01: 18:00:00 via INTRAVENOUS
  Filled 2015-08-01: qty 1988

## 2015-08-01 MED ORDER — FUROSEMIDE 10 MG/ML IJ SOLN
60.0000 mg | Freq: Two times a day (BID) | INTRAMUSCULAR | Status: DC
  Administered 2015-08-01: 60 mg via INTRAVENOUS
  Filled 2015-08-01: qty 6

## 2015-08-01 NOTE — Progress Notes (Signed)
Patient ID: Marissa [name], female   DOB: [birth year], 74 y.o.   MRN: [id number]    Subjective: Patient currently denies pain or other complaints.  Objective: Vital signs in last 24 hours: Temp:  [98.4 F (36.9 C)-98.9 F (37.2 C)] 98.7 F (37.1 C) (11/19 0439) Pulse Rate:  [91-100] 92 (11/19 0439) Resp:  [18-19] 19 (11/19 0439) BP: (96-127)/(43-65) 96/46 mmHg (11/19 0439) SpO2:  [98 %-100 %] 98 % (11/19 0439) Weight:  [105.8 kg (233 lb 4 oz)] 105.8 kg (233 lb 4 oz) (11/19 0439) Last BM Date: 07/26/15  Intake/Output from previous day: 11/18 0701 - 11/19 0700 In: 1583.3 [P.O.:50; TPN:1533.3] Out: 550 [Urine:500; Drains:50] Intake/Output this shift:    General appearance: somewhat somnolent but responsive and verbal GI: no significant tenderness. Eakin's pouch and drain in place central abdomen Incision/Wound: as above  Lab Results:   Recent Labs  07/30/15 0603  WBC 5.6  HGB 8.8*  HCT 27.9*  PLT 218   BMET  Recent Labs  07/31/15 0440 08/01/15 0528  NA 139 136  K 3.8 3.7  CL 103 99*  CO2 29 29  GLUCOSE 133* 163*  BUN 54* 64*  CREATININE 3.44* 3.46*  CALCIUM 8.4* 8.2*     Studies/Results: No results found.  Anti-infectives: Anti-infectives    Start     Dose/Rate Route Frequency Ordered Stop   07/27/15 1557  ceFAZolin (ANCEF) 2-3 GM-% IVPB SOLR  Status:  Discontinued    Comments:  [name]   : cabinet override      07/27/15 1557 07/27/15 1619   07/23/15 1000  vancomycin (VANCOCIN) 1,250 mg in sodium chloride 0.9 % 250 mL IVPB  Status:  Discontinued     1,250 mg 166.7 mL/hr over 90 Minutes Intravenous Every 48 hours 07/23/15 0908 07/28/15 1353   07/16/15 1000  vancomycin (VANCOCIN) 1,500 mg in sodium chloride 0.9 % 500 mL IVPB  Status:  Discontinued     1,500 mg 250 mL/hr over 120 Minutes Intravenous Every 48 hours 07/14/15 0956 07/15/15 1035   07/16/15 1000  vancomycin (VANCOCIN) IVPB 1000 mg/200 mL premix  Status:  Discontinued     1,000 mg 200  mL/hr over 60 Minutes Intravenous Every 48 hours 07/15/15 1035 07/20/15 1242   07/14/15 1500  cefTRIAXone (ROCEPHIN) 1 g in dextrose 5 % 50 mL IVPB  Status:  Discontinued     1 g 100 mL/hr over 30 Minutes Intravenous Every 24 hours 07/13/15 1647 07/17/15 1113   07/14/15 1015  vancomycin (VANCOCIN) 1,500 mg in sodium chloride 0.9 % 500 mL IVPB     1,500 mg 250 mL/hr over 120 Minutes Intravenous NOW 07/14/15 0943 07/14/15 1210   07/13/15 1400  cefTRIAXone (ROCEPHIN) 1 g in dextrose 5 % 50 mL IVPB     1 g 100 mL/hr over 30 Minutes Intravenous  Once 07/13/15 1359 07/13/15 1526      Assessment/Plan: ECF-eakin's pouch, appreciate WOC management. -accurate I&Os -sandostatin/loperamide to slow down transit -cont strict NPO, 171ml out yesterday -SBFT did show EC fistula to be fairly distal in the proximal to mid ileum -pt is also very deconditioned, needs to be optimized nutritionally and physically before any surgical consideration.  -cont TPN FEN-NPO. Previous PO trials have resulted in increased ECF output, dehydration, renal failure and readmission  PCM- prealbumin is 11.9. TPN Acute on chronic renal failure- per medicine. BUN/creatinine and creatinine trending upward VTE prophylaxis-SCD/heparin  Dispo-palliative care has seen patient. Possible SNF.    LOS: 19 days  Kayman Snuffer T 08/01/2015

## 2015-08-01 NOTE — Progress Notes (Signed)
Daily Progress Note   Patient Name: Marissa [name]       Date: 08/01/2015 DOB: [birth year]  Age: 74 y.o. MRN#: [id number] Attending Physician: Jonetta Osgood, MD Primary Care Physician: [name], MD Admit Date: 07/13/2015  Reason for Consultation/Follow-up: Non pain symptom management and Pain control follow up  Subjective: Pt did take lexapro as well as Provigil. PT has not been in today. She is somnolent, disengaged Interval Events: Started sch tylenol, lexapro as well as provigil Length of Stay: 19 days  Current Medications: Scheduled Meds:  . acetaminophen  500 mg Oral 3 times per day  . escitalopram  10 mg Oral Daily  . furosemide  40 mg Intravenous Q12H  . heparin subcutaneous  5,000 Units Subcutaneous 3 times per day  . insulin aspart  0-9 Units Subcutaneous 4 times per day  . loperamide  2 mg Oral 4 times per day  . modafinil  50 mg Oral Daily  . nystatin  5 mL Oral QID  . octreotide  100 mcg Subcutaneous Q12H  . pantoprazole (PROTONIX) IV  40 mg Intravenous Q24H    Continuous Infusions: . TPN (CLINIMIX) Adult without lytes     And  . fat emulsion      PRN Meds: diphenhydrAMINE, diphenhydrAMINE-zinc acetate, Glycerin (Adult), HYDROmorphone (DILAUDID) injection, hydrOXYzine, iohexol, nitroGLYCERIN, ondansetron **OR** ondansetron (ZOFRAN) IV, oxyCODONE, sodium chloride, sodium chloride  Physical Exam: Physical Exam  Constitutional: She appears well-developed.  HENT:  Head: Normocephalic.  Cardiovascular: Normal rate and regular rhythm.   Abdominal: Soft.  Draining abd cutaneous fistula  Musculoskeletal:  Very weak. States her legs won't hold her up or move  Neurological:  Somnolent. Will open eyes to voice, answer simple brief questions  Skin: Skin is  warm and dry.  abd wound  Psychiatric:  Affect constricted                Vital Signs: BP 96/46 mmHg  Pulse 92  Temp(Src) 98.7 F (37.1 C) (Oral)  Resp 19  Ht 5\' 4"  (1.626 m)  Wt 105.8 kg (233 lb 4 oz)  BMI 40.02 kg/m2  SpO2 98% SpO2: SpO2: 98 % O2 Device: O2 Device: Not Delivered O2 Flow Rate: O2 Flow Rate (L/min): 2 L/min  Intake/output summary:  Intake/Output Summary (Last 24 hours) at 08/01/15 1350 Last data  filed at 08/01/15 0513  Gross per 24 hour  Intake 1583.26 ml  Output    250 ml  Net 1333.26 ml   LBM: Last BM Date: 07/26/15 Baseline Weight: Weight: 94.303 kg (207 lb 14.4 oz) Most recent weight: Weight: 105.8 kg (233 lb 4 oz)       Palliative Assessment/Data: Flowsheet Rows        Most Recent Value   Intake Tab    Referral Department  Hospitalist   Unit at Time of Referral  Med/Surg Unit   Palliative Care Primary Diagnosis  Sepsis/Infectious Disease   Date Notified  07/29/15   Palliative Care Type  New Palliative care   Reason for referral  Clarify Goals of Care, Pain   Date of Admission  07/13/15   Date first seen by Palliative Care  07/31/15   # of days Palliative referral response time  2 Day(s)   # of days IP prior to Palliative referral  16   Clinical Assessment    Palliative Performance Scale Score  40%   Pain Max last 24 hours  Not able to report   Pain Min Last 24 hours  Not able to report   Dyspnea Max Last 24 Hours  Not able to report   Dyspnea Min Last 24 hours  Not able to report   Nausea Max Last 24 Hours  Not able to report   Nausea Min Last 24 Hours  Not able to report   Anxiety Max Last 24 Hours  Not able to report   Anxiety Min Last 24 Hours  Not able to report   Other Max Last 24 Hours  Not able to report   Psychosocial & Spiritual Assessment    Social Work Plan of Care  Offered and declined   Palliative Care Outcomes    Patient/Family meeting held?  Yes   Who was at the meeting?  daughter, son and DIL   Palliative Care  Outcomes  Clarified goals of care   Patient/Family wishes: Interventions discontinued/not started   Antibiotics, Tube feedings/TPN   Palliative Care follow-up planned  Yes, Facility   Other Treatment Preference Instructions  wants LTAC if possible      Additional Data Reviewed: CBC    Component Value Date/Time   WBC 5.6 07/30/2015 0603   RBC 3.50* 07/30/2015 0603   RBC 4.06 04/28/2015 1915   HGB 8.8* 07/30/2015 0603   HCT 27.9* 07/30/2015 0603   PLT 218 07/30/2015 0603   MCV 79.7 07/30/2015 0603   MCH 25.1* 07/30/2015 0603   MCHC 31.5 07/30/2015 0603   RDW 15.2 07/30/2015 0603   LYMPHSABS 1.0 07/20/2015 1020   MONOABS 0.9 07/20/2015 1020   EOSABS 0.5 07/20/2015 1020   BASOSABS 0.1 07/20/2015 1020    CMP     Component Value Date/Time   NA 136 08/01/2015 0528   K 3.7 08/01/2015 0528   CL 99* 08/01/2015 0528   CO2 29 08/01/2015 0528   GLUCOSE 163* 08/01/2015 0528   BUN 64* 08/01/2015 0528   CREATININE 3.46* 08/01/2015 0528   CREATININE 1.64* 06/27/2014 1647   CALCIUM 8.2* 08/01/2015 0528   PROT 5.6* 07/30/2015 0603   ALBUMIN 1.9* 07/30/2015 0603   AST 17 07/30/2015 0603   ALT 25 07/30/2015 0603   ALKPHOS 83 07/30/2015 0603   BILITOT 0.4 07/30/2015 0603   GFRNONAA 12* 08/01/2015 0528   GFRAA 14* 08/01/2015 0528       Problem List:  Patient Active Problem List  Diagnosis Date Noted  . Palliative care encounter   . Acute kidney injury ([place])   . Acute on chronic renal failure ([place]) 07/13/2015  . Metabolic acidosis 123456  . Urinary tract infection 07/13/2015  . Oral thrush 07/13/2015  . Dehydration 06/04/2015  . Encephalopathy 06/04/2015  . Abdominal pain 06/04/2015  . Acute renal failure ([place]) 06/04/2015  . Hyponatremia 05/10/2015  . Acute idiopathic gout of left ankle   . Hyperkalemia 05/04/2015  . Type 2 diabetes mellitus without complication ([place])   . D-dimer, elevated 04/16/2015  . Precordial chest pain 04/15/2015  . Enterocutaneous fistula  03/13/2015  . Abdominal wall abscess 03/13/2015  . Non-compliant behavior 01/22/2015  . Anemia, iron deficiency 12/21/2014  . Diverticulosis of colon with hemorrhage 12/20/2014  . Chronic diastolic CHF (congestive heart failure) ([place]) 06/29/2014  . Routine general medical examination at a health care facility 05/30/2014  . Dementia arising in the senium and presenium 03/29/2013  . Acute gouty arthritis 02/15/2012  . Other screening mammogram 02/15/2012  . Osteopenia 02/15/2012  . 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 Hyperlipidemia with target LDL less than 100 10/06/2011  . OSA (obstructive sleep apnea) 04/02/2009  . GERD 08/20/2006  . Gout 08/15/2006  . Essential hypertension 08/15/2006  . CAD S/P OM BMS 8/07, ISR-OM DES 8/09 08/15/2006  . Chronic kidney disease, stage 3 08/15/2006  . DVT, HX OF 08/15/2006     Palliative Care Assessment & Plan    1.Code Status:  DNR    Code Status Orders        Start     Ordered   07/13/15 1835  Do not attempt resuscitation (DNR)   Continuous    Question Answer Comment  In the event of cardiac or respiratory ARREST Do not call a "code blue"   In the event of cardiac or respiratory ARREST Do not perform Intubation, CPR, defibrillation or ACLS   In the event of cardiac or respiratory ARREST Use medication by any route, position, wound care, and other measures to relive pain and suffering. May use oxygen, suction and manual treatment of airway obstruction as needed for comfort.      07/13/15 1834       2. Goals of Care/Additional Recommendations:  For SNF or LTAC  Limitations on Scope of Treatment: Full Scope Treatment except for DNR/DNI  Desire for further Chaplaincy support:no  3. Symptom Management:      1.Pain: Improving. Cont sch tylenol and prn dilaudid       2. Depression: Cont lexapro and Provigil for effect. No adverse effects  4. Palliative Prophylaxis:   Bowel Regimen  5. Prognosis: Likely less than 6 months  given FTT as well as enterocutaneous fistula and likely unable to have surgical repair in setting of worsening renal function  6. Discharge Planning:  [facility] for rehab with Palliative care service follow-up   Care plan was discussed with Dr. Sloan Leiter  Thank you for allowing the Palliative Medicine Team to assist in the care of this patient.   Time In: 1200 Time Out: 1220 Total Time 20 min Prolonged Time Billed  no         Dory Horn, NP  08/01/2015, 1:50 PM  Please contact Palliative Medicine Team phone at [phone] for questions and concerns.

## 2015-08-01 NOTE — Progress Notes (Signed)
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   No Known Allergies  Patient Measurements: Height: 5\' 4"  (162.6 cm) Weight: 233 lb 4 oz (105.8 kg) IBW/kg (Calculated) : 54.7    Vital Signs: Temp: 98.7 F (37.1 C) (11/19 0439) Temp Source: Oral (11/19 0439) BP: 96/46 mmHg (11/19 0439) Pulse Rate: 92 (11/19 0439) Intake/Output from previous day: 11/18 0701 - 11/19 0700 In: 1583.3 [P.O.:50; TPN:1533.3] Out: 550 [Urine:500; Drains:50] Intake/Output from this shift:    Labs:  Recent Labs  07/30/15 0603  WBC 5.6  HGB 8.8*  HCT 27.9*  PLT 218     Recent Labs  07/30/15 0603 07/31/15 0440 08/01/15 0528  NA 139 139 136  K 3.7 3.8 3.7  CL 106 103 99*  CO2 26 29 29   GLUCOSE 146* 133* 163*  BUN 37* 54* 64*  CREATININE 3.32* 3.44* 3.46*  CALCIUM 8.5* 8.4* 8.2*  MG 1.7 1.8 2.0  PHOS 5.2* 6.1* 4.6  PROT 5.6*  --   --   ALBUMIN 1.9*  --   --   AST 17  --   --   ALT 25  --   --   ALKPHOS 83  --   --   BILITOT 0.4  --   --    Estimated Creatinine Clearance: 16.9 mL/min (by C-G formula based on Cr of 3.46).    Recent Labs  07/31/15 2347 08/01/15 0358 08/01/15 0815  GLUCAP 181* 147* 150*   Insulin Requirements in the past 12 hours:  4 units  Nutritional Goals: per RD note 11/15 1700-1900 kCal, 100-110 grams of protein per day  Current Nutrition:  NPO Clinimix 5/15 + lipids 20% cycled over 18 hours  Nutrition from PTA:  Per discussions/notes from [facility], was receiving cycled Clinimix 5/15 alternated with E 5/15, 2026ml over 12 hours (2000-0800). Per RN from facility [facility], patient was receiving TPN during her entire stay, however patient's admission labs/dehydrated state as well as report from daughter make this questionable.  Assessment: 40 YOF on chronic TPN for a fistula who was admitted from [facility] with lethargy, 2 days of vomiting, increased drainage from  fistula and severe dehydration. Her EC output was bilious and Eakin's pouch was removed at facility. CT scan negative for infection however blood cultures from 10/31 were 2/2 CoNS. TPN held for line holiday and PICC was removed. Repeat blood cultures have been clear - and a new PICC was placed 11/10 to resume TPN. PICC removed 11/12 due to VTE above and below PICC line. TPN stopped 11/12.  Tunneled catheter placed by IR 11/14.   resumed TPN 11/5.   Surgeries/Procedures: 04/28/15: wound exploration, explanation of abdominal mesh Multiple other abdominal surgeries GI: 27ml output yesterday. Continues on loperamide, octreotide, PPI IV. Pre-albumin 35.1 >>11.9  Endo: A1C this admission is 6.2 (up from 5.7 in July 2016) - CBGs well controlled Lytes:K 3.7, Mg 2, phos now normalized to 4.6 (Ca x Phos = 46)  Renal: AoCKD III d/t dehydration - SCr up to 3.46. IVF stopped 11/15 Hepatobil: LFTs WNL, TG 84 (11/7) Pulm:  98% RA Cards: BP 96/46, HR 92, EF 55% - lasix ID: Completed antibiotics, continued on nystatin for thrush Best Practices Hep SQ  TPN Access: PICC placed 11/10 for TPN removed 11/12 11/14 tunneled catheter placed TPN start date: chronic TPN- was on at home prior to admission in Sept, and continued after discharge. resumed 11/1 at the hospital, off  11/4, resumed 11/10, off 11/12, resumed 11/15  Plan: - Continue to cycle Clinimix 5/15 (WITHOUT lytes) over 18 hours - provides 100% nutrition goals (100gm protein/day + 1880 Kcal/day)  - Daily MVI and trace elements in TPN - Continue SSI until cycled TPN is at goal - F/u AM BMET, Mg, Phos  Salome Arnt, PharmD, BCPS Pager # [phone] 08/01/2015 9:35 AM

## 2015-08-01 NOTE — Progress Notes (Signed)
PATIENT DETAILS Name: Marissa [name] Age: 74 y.o. Sex: female Date of Birth: Nov 16, 1940 Admit Date: 07/13/2015 Admitting Physician Waldemar Dickens, MD BZJ:IRCVEL Ronnald Ramp, MD  Brief narrative: Patient is a 74 year old female with known history of enterocutaneous fistula-who was recently admitted on 9/22 for increased ostomy output with encephalopathy. She was treated with TNA/Sandostatin-a eakin pouch was placed, she was subsequently discharged to SNF on 10/4. Patient was readmitted on 10/31, after increased biliary drainage from her fistula along with biliary emesis. Patient was found to have acute on chronic kidney disease, coagulase negative Staphylococcus bacteremia. General surgery/infectious disease has been following patient. Her prior PICC line has been discontinued, and a Port-A-Cath has been placed. Unfortunately, hospital course has now been complicated by redevelopment of acute renal failure, along with severe failure to thrive syndrome. Plans are for definite surgical repair of enterocutaneous fistula once patient is nutritionally and physically ready. Currently remains NPO and on TNA-seen by Palliative care-after discussion with family-tentative plans are for SNF over the next few days.  Subjective: Sleeping comfortably-complains of burning sensation in the skin of her abdomen. Still seems not to be very motivated.  Assessment/Plan: Principal Problem: Acute on chronic renal failure stage III: Felt to have acute renal failure secondary to prerenal azotemia from dehydration/high ostomy output which initially improved, but recurred-thought to be secondary to vancomycin. Creatinine seems to have stabilized/plateaued. Nephrology following-Currently on IV Lasix due to volume overload. Per nephrology, patient not a long-term candidate for hemodialysis.  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 Continue Sandostatin and loperamide. Remains  NPO-TNA-50 cc ostomy output yesterday. Per general surgery-currently a poor surgical candidate for repair of enterocutaneous fistula-needs to be optimized both nutritionally and physically before proceeding with surgery.  Sepsis secondary to coagulase negative Staphylococcus bacteremia: Likely catheter-related bacteremia-likely present on admission.PICC line has been discontinued.Port placed by IR on 11/14. Although recommendations were to stop vancomycin on 11/20-given worsening renal function-vancomycin was discontinued on 11/15. Dr. Tana Coast spoke with infectious disease M.D.-Dr Linus Salmons who recommended to observe off antibiotics. Remains afebrile and without leukocytosis. Note blood cultures on 11/7 and 11/8 negative so far.   Anemia: Secondary to acute on chronic illness/CKD- transfuse 2 units of PRBC on 11/15-follow hemoglobin. No overt evidence of any blood loss.  Proteus FYB:OFBPZWCHENID, completed 5 days of empiric Rocephin.  Transaminitis: Resolved. Likely secondary to sepsis/shock liver. History of cholecystectomy. Abdominal ultrasound on 11/8 did not show any major intrahepatic/extrahepatic findings.  Acute encephalopathy: Likely secondary to sepsis-resolved. Although very deconditioned/weak-is awake and alert.  Acute on Chronic diastolic heart failure: Seems to have developed volume overload-currently on IV Lasix. Follow closely.   Oral thrush: Continue nystatin  Hyponatremia: Resolved.  Severe malnutrition with failure to thrive/Deconditioning:on TPN-PT/OT following. Patient remains very frustrated about her overall health.She seems to lack motivation for ambulation etc. Palliative care team met with family on 11/18-plans are to continue with current measures-family is helpful of eventual recovery. Family very much aware that if no recovering over the next few weeks-then will likely  require transitioning to hospice care.Will likely d/c on 11/21 to SNF  OSA (obstructive sleep apnea): continue CPAP  Depression: Continue Lexapro  Disposition: Remain inpatient-SNF over the next few days  Antimicrobial agents  See below  Anti-infectives    Start     Dose/Rate Route Frequency Ordered Stop   07/27/15  1557  ceFAZolin (ANCEF) 2-3 GM-% IVPB SOLR  Status:  Discontinued    Comments:  Marissa [name]   : cabinet override      07/27/15 1557 07/27/15 1619   07/23/15 1000  vancomycin (VANCOCIN) 1,250 mg in sodium chloride 0.9 % 250 mL IVPB  Status:  Discontinued     1,250 mg 166.7 mL/hr over 90 Minutes Intravenous Every 48 hours 07/23/15 0908 07/28/15 1353   07/16/15 1000  vancomycin (VANCOCIN) 1,500 mg in sodium chloride 0.9 % 500 mL IVPB  Status:  Discontinued     1,500 mg 250 mL/hr over 120 Minutes Intravenous Every 48 hours 07/14/15 0956 07/15/15 1035   07/16/15 1000  vancomycin (VANCOCIN) IVPB 1000 mg/200 mL premix  Status:  Discontinued     1,000 mg 200 mL/hr over 60 Minutes Intravenous Every 48 hours 07/15/15 1035 07/20/15 1242   07/14/15 1500  cefTRIAXone (ROCEPHIN) 1 g in dextrose 5 % 50 mL IVPB  Status:  Discontinued     1 g 100 mL/hr over 30 Minutes Intravenous Every 24 hours 07/13/15 1647 07/17/15 1113   07/14/15 1015  vancomycin (VANCOCIN) 1,500 mg in sodium chloride 0.9 % 500 mL IVPB     1,500 mg 250 mL/hr over 120 Minutes Intravenous NOW 07/14/15 0943 07/14/15 1210   07/13/15 1400  cefTRIAXone (ROCEPHIN) 1 g in dextrose 5 % 50 mL IVPB     1 g 100 mL/hr over 30 Minutes Intravenous  Once 07/13/15 1359 07/13/15 1526      DVT Prophylaxis:  Prophylactic Heparin  Code Status:  DNR  Family Communication [name] Staff-Daughter over the phone 11/16-none at bedside today  Procedures: IR placed tunneled right jugular central line on 11/14  CONSULTS:  general surgery   Renal  Time spent 30 minutes-Greater than 50% of this time was spent in counseling,  explanation of diagnosis, planning of further management, and coordination of care.  MEDICATIONS: Scheduled Meds: . acetaminophen  500 mg Oral 3 times per day  . escitalopram  10 mg Oral Daily  . furosemide  40 mg Intravenous Q12H  . heparin subcutaneous  5,000 Units Subcutaneous 3 times per day  . insulin aspart  0-9 Units Subcutaneous 4 times per day  . loperamide  2 mg Oral 4 times per day  . modafinil  50 mg Oral Daily  . nystatin  5 mL Oral QID  . octreotide  100 mcg Subcutaneous Q12H  . pantoprazole (PROTONIX) IV  40 mg Intravenous Q24H   Continuous Infusions: . TPN (CLINIMIX) Adult without lytes     And  . fat emulsion     PRN Meds:.diphenhydrAMINE, diphenhydrAMINE-zinc acetate, Glycerin (Adult), HYDROmorphone (DILAUDID) injection, hydrOXYzine, iohexol, nitroGLYCERIN, ondansetron **OR** ondansetron (ZOFRAN) IV, oxyCODONE, sodium chloride, sodium chloride    PHYSICAL EXAM: Vital signs in last 24 hours: Filed Vitals:   07/31/15 0608 07/31/15 1421 07/31/15 2139 08/01/15 0439  BP: 106/55 127/43 113/65 96/46  Pulse: 88 100 91 92  Temp: 98.4 F (36.9 C) 98.9 F (37.2 C) 98.4 F (36.9 C) 98.7 F (37.1 C)  TempSrc: Oral Oral Oral Oral  Resp: _0 Height:      Weight:    105.8 kg (233 lb 4 oz)  SpO2: 97% 100% 100% 98%    Weight change:  Filed Weights   07/28/15 0530 07/29/15 0636 08/01/15 0439  Weight: 109.68 kg (241 lb 12.8 oz) 109.362 kg (241 lb 1.6 oz) 105.8 kg (233 lb 4 oz)   Body mass index  is 40.02 kg/(m^2).   Gen Exam: Awake and alert with clear but slow speech. Significantly deconditioned and weak. Neck: Supple, Chest: B/L Clear.   CVS: S1 S2 Regular, no murmurs.  Abdomen: Soft, midline wound, eakin pouch to wall suction Extremities: + edema, lower extremities warm to touch. Neurologic: Non Focal.       Intake/Output from previous day:  Intake/Output Summary (Last 24 hours) at 08/01/15 1417 Last data filed at 08/01/15 0513  Gross per 24 hour   Intake 1583.26 ml  Output    250 ml  Net 1333.26 ml     LAB RESULTS: CBC  Recent Labs Lab 07/28/15 0620 07/29/15 0450 07/30/15 0603  WBC 4.6 4.7 5.6  HGB 6.8* 8.5* 8.8*  HCT 21.9* 27.4* 27.9*  PLT 229 219 218  MCV 78.2 79.2 79.7  MCH 24.3* 24.6* 25.1*  MCHC 31.1 31.0 31.5  RDW 14.5 14.7 15.2    Chemistries   Recent Labs Lab 07/26/15 0220 07/28/15 0620 07/29/15 0450 07/30/15 0603 07/31/15 0440 08/01/15 0528  NA 137 140 141 139 139 136  K 4.3 3.7 3.7 3.7 3.8 3.7  CL 103 107 109 106 103 99*  CO2 _0 GLUCOSE 107* 114* 135* 146* 133* 163*  BUN 48* 32* 29* 37* 54* 64*  CREATININE 2.88* 3.25* 3.37* 3.32* 3.44* 3.46*  CALCIUM 8.2* 8.1* 8.2* 8.5* 8.4* 8.2*  MG 1.9  --  1.8 1.7 1.8 2.0    CBG:  Recent Labs Lab 07/31/15 1929 07/31/15 2347 08/01/15 0358 08/01/15 0815 08/01/15 1155  GLUCAP 156* 181* 147* 150* 113*    GFR Estimated Creatinine Clearance: 16.9 mL/min (by C-G formula based on Cr of 3.46).  Coagulation profile No results for input(s): INR, PROTIME in the last 168 hours.  Cardiac Enzymes No results for input(s): CKMB, TROPONINI, MYOGLOBIN in the last 168 hours.  Invalid input(s): CK  Invalid input(s): POCBNP No results for input(s): DDIMER in the last 72 hours. No results for input(s): HGBA1C in the last 72 hours. No results for input(s): CHOL, HDL, LDLCALC, TRIG, CHOLHDL, LDLDIRECT in the last 72 hours. No results for input(s): TSH, T4TOTAL, T3FREE, THYROIDAB in the last 72 hours.  Invalid input(s): FREET3 No results for input(s): VITAMINB12, FOLATE, FERRITIN, TIBC, IRON, RETICCTPCT in the last 72 hours. No results for input(s): LIPASE, AMYLASE in the last 72 hours.  Urine Studies No results for input(s): UHGB, CRYS in the last 72 hours.  Invalid input(s): UACOL, UAPR, USPG, UPH, UTP, UGL, UKET, UBIL, UNIT, UROB, ULEU, UEPI, UWBC, URBC, UBAC, CAST, UCOM, BILUA  MICROBIOLOGY: No results found for this or any previous  visit (from the past 240 hour(s)).  RADIOLOGY STUDIES/RESULTS: Ct Abdomen Pelvis Wo Contrast  07/13/2015  CLINICAL DATA:  Subsequent encounter for nausea vomiting with low abdominal pain and increased drainage from fistula. EXAM: CT ABDOMEN AND PELVIS WITHOUT CONTRAST TECHNIQUE: Multidetector CT imaging of the abdomen and pelvis was performed following the standard protocol without IV contrast. COMPARISON:  06/04/2015. FINDINGS: Lower chest: Motion artifact with some atelectasis in the dependent bases. Hepatobiliary: No focal abnormality in the liver on this study without intravenous contrast. No evidence of hepatomegaly. Gallbladder is surgically absent. No intrahepatic or extrahepatic biliary dilation. Pancreas: No focal mass lesion. No dilatation of the main duct. No intraparenchymal cyst. No peripancreatic edema. Spleen: No splenomegaly. No focal mass lesion. Adrenals/Urinary Tract: No adrenal nodule or mass. No mass lesion evident in either kidney on this study without intravenous contrast material.  No evidence for hydroureteronephrosis. The urinary bladder appears normal for the degree of distention. Stomach/Bowel: Small hiatal hernia noted. Metallic foreign body identified along the posterior wall of the gastric fundus. This is been present on multiple prior studies is well. Duodenum is normally positioned as is the ligament of Treitz. No small bowel wall thickening. No small bowel dilatation. The terminal ileum is normal. The appendix is not visualized, but there is no edema or inflammation in the region of the cecum. Diverticuli are seen scattered along the entire length of the colon without CT findings of diverticulitis. Small bowel adhesions to the anterior abdominal wall again noted. There is soft tissue attenuation in the midline anterior abdominal wall, at the site of previous surgical wound. This is the area where enterocutaneous fistula was visualized on the previous study. No gas in this region  today but no oral contrast was administered to allow assessment of the fistula. Vascular/Lymphatic: There is abdominal aortic atherosclerosis without aneurysm. There is no gastrohepatic or hepatoduodenal ligament lymphadenopathy. No intraperitoneal or retroperitoneal lymphadenopy. No pelvic sidewall lymphadenopathy. Reproductive: Uterus is surgically absent. There is no adnexal mass. Other: No intraperitoneal free fluid. Musculoskeletal: Bone windows reveal no worrisome lytic or sclerotic osseous lesions. IMPRESSION: Stable exam. No new or acute interval findings. There is no gas in the region of the chronic enterocutaneous fistula no evidence to suggest interval development of an abscess in this region. Fistula not well assessed given the lack of oral contrast on today's study. No evidence for bowel obstruction. No intraperitoneal fluid collection and no intraperitoneal free fluid. Electronically Signed   By: Misty Stanley M.D.   On: 07/13/2015 13:27   Dg Chest 2 View  07/13/2015  CLINICAL DATA:  Emesis.  CHF. EXAM: CHEST  2 VIEW COMPARISON:  06/04/2015 chest radiograph FINDINGS: Right PICC terminates in the lower third of the superior vena cava. Stable cardiomediastinal silhouette with mild cardiomegaly. No pneumothorax. No pleural effusion. Clear lungs, with no focal lung consolidation and no pulmonary edema. IMPRESSION: Stable mild cardiomegaly without pulmonary edema. Lungs appear clear. Electronically Signed   By: Ilona Sorrel M.D.   On: 07/13/2015 12:43   US Abdomen Complete  07/21/2015  CLINICAL DATA:  Elevated liver function studies. EXAM: ULTRASOUND ABDOMEN COMPLETE COMPARISON:  Multiple prior ultrasound and CT examinations. FINDINGS: Gallbladder: Surgically absent Common bile duct: Diameter: 7.1 mm Liver: Normal echogenicity without focal lesion or biliary dilatation. IVC: Normal caliber Pancreas: Poorly visualized but normal on recent CT scans. Spleen: Normal size.  No focal lesions. Right Kidney:  Length: 10.4 cm. Mild renal cortical thinning but no mass or hydronephrosis. Left Kidney: Length: 10.3 cm. Mild renal cortical thinning but no mass or hydronephrosis. Abdominal aorta: Normal caliber Other findings: None. IMPRESSION: Status post cholecystectomy with mild associated common bile duct dilatation. Normal sonographic appearance of the liver. No intrahepatic biliary dilatation. Poor visualization of the pancreas but it was normal on recent CT scans. Electronically Signed   By: Marijo Sanes M.D.   On: 07/21/2015 23:43   US Renal  07/17/2015  CLINICAL DATA:  Renal insufficiency EXAM: RENAL ULTRASOUND COMPARISON:  Renal ultrasound June 08, 2015; CT abdomen and pelvis July 13, 2015 FINDINGS: Right Kidney: Length: 11.1 cm. Echogenicity and renal cortical thickness are within normal limits. No perinephric fluid or hydronephrosis visualized. There is a cyst in the upper pole region measuring 1.5 x 1.5 x 1.5 cm. No sonographically demonstrable calculus or ureterectasis. Left Kidney: Length: 10.8 cm. Echogenicity and renal cortical thickness are  within normal limits. No mass, perinephric fluid, or hydronephrosis visualized. No sonographically demonstrable calculus. Bladder: Completely empty and cannot be assessed. IMPRESSION: Cyst arising from upper pole right kidney. Study otherwise unremarkable. Note the urinary bladder is empty and cannot be assessed. Electronically Signed   By: Lowella Grip III M.D.   On: 07/17/2015 11:45   Ir Fluoro Guide Cv Line Right  07/27/2015  INDICATION: 74 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 Patient also has chronic kidney disease. Patient needs IV access for TPN and antibiotics. EXAM: FLUOROSCOPIC AND ULTRASOUND GUIDED PLACEMENT OF A TUNNELED CENTRAL VENOUS CATHETER Physician: Stephan Minister. Henn, MD FLUOROSCOPY TIME:  54 seconds, 23 mGy MEDICATIONS: 1 mg Versed, 25 mcg fentanyl. A radiology nurse monitored the patient for moderate sedation. ANESTHESIA/SEDATION:  Moderate sedation time: 20 minutes PROCEDURE: Informed consent was obtained for placement of a tunneled central venous catheter. The patient was placed supine on the interventional table. Ultrasound confirmed a patent right internal jugularvein. Ultrasound images were obtained for documentation. The right side of the neck was prepped and draped in a sterile fashion. The right side of the neck was anesthetized with 1% lidocaine. Maximal barrier sterile technique was utilized including caps, mask, sterile gowns, sterile gloves, sterile drape, hand hygiene and skin antiseptic. A small incision was made with #11 blade scalpel. A 21 gauge needle directed into the right internal jugular vein with ultrasound guidance. A micropuncture dilator set was placed. A dual lumen Powerline catheter was selected. The skin below the right clavicle was anesthetized and a small incision was made with an #11 blade scalpel. A subcutaneous tunnel was formed to the vein dermatotomy site. The catheter was brought through the tunnel and cut to 22 cm. The vein dermatotomy site was dilated to accommodate a peel-away sheath. The catheter was placed through the peel-away sheath and directed into the central venous structures. The tip of the catheter was placed at the superior cavoatrial junction with fluoroscopy. Fluoroscopic images were obtained for documentation. Both lumens were found to aspirate and flush well. The vein dermatotomy site was closed using a single layer of absorbable suture and Dermabond. The catheter was secured to the skin using Prolene suture. FINDINGS: Catheter tip at the superior cavoatrial junction. Estimated blood loss: Minimal COMPLICATIONS: None IMPRESSION: Successful placement of a right jugular tunneled central venous catheter using ultrasound and fluoroscopic guidance. Electronically Signed   By: Markus Daft M.D.   On: 07/27/2015 17:40   Ir US Guide Vasc Access Right  07/27/2015  INDICATION: 74 year old with  enterocutaneous fistula. Patient also has chronic kidney disease. Patient needs IV access for TPN and antibiotics. EXAM: FLUOROSCOPIC AND ULTRASOUND GUIDED PLACEMENT OF A TUNNELED CENTRAL VENOUS CATHETER Physician: Stephan Minister. Henn, MD FLUOROSCOPY TIME:  54 seconds, 23 mGy MEDICATIONS: 1 mg Versed, 25 mcg fentanyl. A radiology nurse monitored the patient for moderate sedation. ANESTHESIA/SEDATION: Moderate sedation time: 20 minutes PROCEDURE: Informed consent was obtained for placement of a tunneled central venous catheter. The patient was placed supine on the interventional table. Ultrasound confirmed a patent right internal jugularvein. Ultrasound images were obtained for documentation. The right side of the neck was prepped and draped in a sterile fashion. The right side of the neck was anesthetized with 1% lidocaine. Maximal barrier sterile technique was utilized including caps, mask, sterile gowns, sterile gloves, sterile drape, hand hygiene and skin antiseptic. A small incision was made with #11 blade scalpel. A 21 gauge needle directed into the right internal jugular vein with ultrasound guidance. A micropuncture dilator set was  placed. A dual lumen Powerline catheter was selected. The skin below the right clavicle was anesthetized and a small incision was made with an #11 blade scalpel. A subcutaneous tunnel was formed to the vein dermatotomy site. The catheter was brought through the tunnel and cut to 22 cm. The vein dermatotomy site was dilated to accommodate a peel-away sheath. The catheter was placed through the peel-away sheath and directed into the central venous structures. The tip of the catheter was placed at the superior cavoatrial junction with fluoroscopy. Fluoroscopic images were obtained for documentation. Both lumens were found to aspirate and flush well. The vein dermatotomy site was closed using a single layer of absorbable suture and Dermabond. The catheter was secured to the skin using Prolene  suture. FINDINGS: Catheter tip at the superior cavoatrial junction. Estimated blood loss: Minimal COMPLICATIONS: None IMPRESSION: Successful placement of a right jugular tunneled central venous catheter using ultrasound and fluoroscopic guidance. Electronically Signed   By: Markus Daft M.D.   On: 07/27/2015 17:40   Dg Chest Port 1 View  07/25/2015  CLINICAL DATA:  PICC line placement EXAM: PORTABLE CHEST 1 VIEW COMPARISON:  07/13/2015 FINDINGS: Left PICC line tip is at the innominate SVC junction. This could be advanced 5 cm to the SVC RA junction. Normal heart size and vascularity. Minor streaky left base atelectasis. Right lung remains clear. No edema, effusion, or pneumothorax. Trachea midline. Atherosclerosis of the aorta. IMPRESSION: Left PICC line tip at the innominate SVC junction and can be advanced 5 cm to the SVC RA junction. Left base atelectasis Thoracic atherosclerosis Electronically Signed   By: Jerilynn Mages.  Shick M.D.   On: 07/25/2015 12:07   Dg Ugi W/small Bowel  07/20/2015  CLINICAL DATA:  74 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 Prior repair. Question level of fistula. Subsequent encounter. EXAM: UPPER GI SERIES WITH SMALL BOWEL FOLLOW-THROUGH FLUOROSCOPY TIME:  Radiation Exposure Index (as provided by the fluoroscopic device): 1634.12 micro Gy TECHNIQUE: Water-soluble upper GI series followed by small bowel were obtained after discussion with Dr. Kieth Brightly. COMPARISON:  None. FINDINGS: Draining clips are noted on scout view. Patient was only able to ingest a small amount of Omnipaque 300 at one time. Total of 150 cc was ingested. The present examination was specifically tailored to evaluate for level of the enterocutaneous fistula. Gastroesophageal reflux noted. Patient was kept in the upright position to avoid aspiration. Scattered duodenal diverticulum. Nonspecific eggshell calcification right upper quadrant. Enterocutaneous fistulas best seen on lateral overhead imaging. This appears  to arise from the proximal to mid ileum. Distal aspect of the small bowel not visualized. Patient was tired of the exam at this point. Study terminated. IMPRESSION: Enterocutaneous fistulas best seen on lateral overhead imaging. This appears to arise from the proximal to mid ileum. Exam otherwise limited as noted above. Electronically Signed   By: Genia Del M.D.   On: 07/20/2015 15:46    Oren Binet, MD  [facility] Pager:[phone]  If 7PM-7AM, please contact night-coverage [email or web address] Password [email or web address] 08/01/2015, 2:17 PM   LOS: 19 days

## 2015-08-02 LAB — CBC
HEMATOCRIT: 28 % — AB (ref 36.0–46.0)
Hemoglobin: 8.6 g/dL — ABNORMAL LOW (ref 12.0–15.0)
MCH: 24.3 pg — ABNORMAL LOW (ref 26.0–34.0)
MCHC: 30.7 g/dL (ref 30.0–36.0)
MCV: 79.1 fL (ref 78.0–100.0)
PLATELETS: 251 10*3/uL (ref 150–400)
RBC: 3.54 MIL/uL — ABNORMAL LOW (ref 3.87–5.11)
RDW: 14.9 % (ref 11.5–15.5)
WBC: 6 10*3/uL (ref 4.0–10.5)

## 2015-08-02 LAB — BASIC METABOLIC PANEL
Anion gap: 8 (ref 5–15)
BUN: 80 mg/dL — ABNORMAL HIGH (ref 6–20)
CALCIUM: 8.1 mg/dL — AB (ref 8.9–10.3)
CO2: 28 mmol/L (ref 22–32)
CREATININE: 3.57 mg/dL — AB (ref 0.44–1.00)
Chloride: 97 mmol/L — ABNORMAL LOW (ref 101–111)
GFR, EST AFRICAN AMERICAN: 13 mL/min — AB (ref >60)
GFR, EST NON AFRICAN AMERICAN: 12 mL/min — AB (ref >60)
GLUCOSE: 167 mg/dL — AB (ref 65–99)
Potassium: 3.3 mmol/L — ABNORMAL LOW (ref 3.5–5.1)
Sodium: 133 mmol/L — ABNORMAL LOW (ref 135–145)

## 2015-08-02 LAB — GLUCOSE, CAPILLARY
GLUCOSE-CAPILLARY: 94 mg/dL (ref 65–99)
Glucose-Capillary: 198 mg/dL — ABNORMAL HIGH (ref 65–99)
Glucose-Capillary: 131 mg/dL — ABNORMAL HIGH (ref 65–99)
Glucose-Capillary: 70 mg/dL (ref 65–99)
Glucose-Capillary: 163 mg/dL — ABNORMAL HIGH (ref 65–99)

## 2015-08-02 LAB — PHOSPHORUS: PHOSPHORUS: 4 mg/dL (ref 2.5–4.6)

## 2015-08-02 LAB — MAGNESIUM: Magnesium: 1.7 mg/dL (ref 1.7–2.4)

## 2015-08-02 MED ORDER — INSULIN ASPART 100 UNIT/ML ~~LOC~~ SOLN
0.0000 [IU] | SUBCUTANEOUS | Status: DC
  Administered 2015-08-02 (×2): 2 [IU] via SUBCUTANEOUS
  Administered 2015-08-02 – 2015-08-03 (×2): 1 [IU] via SUBCUTANEOUS
  Administered 2015-08-03: 3 [IU] via SUBCUTANEOUS
  Administered 2015-08-03: 1 [IU] via SUBCUTANEOUS
  Administered 2015-08-03 – 2015-08-04 (×2): 2 [IU] via SUBCUTANEOUS
  Administered 2015-08-04: 3 [IU] via SUBCUTANEOUS
  Administered 2015-08-04: 1 [IU] via SUBCUTANEOUS

## 2015-08-02 MED ORDER — FAT EMULSION 20 % IV EMUL
240.0000 mL | INTRAVENOUS | Status: AC
  Administered 2015-08-02: 240 mL via INTRAVENOUS
  Filled 2015-08-02: qty 250

## 2015-08-02 MED ORDER — WHITE PETROLATUM GEL
Status: AC
  Administered 2015-08-02: 0.2
  Filled 2015-08-02: qty 1

## 2015-08-02 MED ORDER — POTASSIUM CHLORIDE 10 MEQ/50ML IV SOLN
10.0000 meq | INTRAVENOUS | Status: AC
  Administered 2015-08-02 (×3): 10 meq via INTRAVENOUS
  Filled 2015-08-02 (×3): qty 50

## 2015-08-02 MED ORDER — DEXTROSE 50 % IV SOLN
12.5000 g | Freq: Once | INTRAVENOUS | Status: AC
  Administered 2015-08-02: 12.5 g via INTRAVENOUS

## 2015-08-02 MED ORDER — TRACE MINERALS CR-CU-MN-SE-ZN 10-1000-500-60 MCG/ML IV SOLN
INTRAVENOUS | Status: AC
  Administered 2015-08-02: 18:00:00 via INTRAVENOUS
  Filled 2015-08-02: qty 1900

## 2015-08-02 MED ORDER — DEXTROSE 50 % IV SOLN
INTRAVENOUS | Status: AC
  Filled 2015-08-02: qty 50

## 2015-08-02 NOTE — Progress Notes (Signed)
Pt has refused cpap for tonight.  RT will continue to monitor. 

## 2015-08-02 NOTE — Care Management Important Message (Signed)
Important Message  Patient Details  Name: Marissa [name] MRN: [id number] Date of Birth: [birth year]   Medicare Important Message Given:  Yes    [name] P [place] 08/02/2015, 10:12 AM

## 2015-08-02 NOTE — Progress Notes (Signed)
PATIENT DETAILS Name: Promiss Labarbera Age: 74 y.o. Sex: female Date of Birth: 1941/05/14 Admit Date: 07/13/2015 Admitting Physician Waldemar Dickens, MD OPF:[id number] Ronnald Ramp, MD  Brief narrative: Patient is a 74 year old female with known history of enterocutaneous fistula-who was recently admitted on 9/22 for increased ostomy output with encephalopathy. She was treated with TNA/Sandostatin-a eakin pouch was placed, she was subsequently discharged to SNF on 10/4. Patient was readmitted on 10/31, after increased biliary drainage from her fistula along with biliary emesis. Patient was found to have acute on chronic kidney disease, coagulase negative Staphylococcus bacteremia. General surgery/infectious disease has been following patient. Her prior PICC line has been discontinued, and a Port-A-Cath has been placed. Unfortunately, hospital course has now been complicated by redevelopment of acute renal failure, along with severe failure to thrive syndrome. Plans are for definite surgical repair of enterocutaneous fistula once patient is nutritionally and physically ready. Currently remains NPO and on TNA-seen by Palliative care-after discussion with family-tentative plans are for SNF on discharge  Subjective: Ccontinues to complains of burning sensation in the skin of her abdomen.   Assessment/Plan: Principal Problem: Acute on chronic renal failure stage III: Felt to have acute renal failure secondary to prerenal azotemia from dehydration/high ostomy output which initially improved, but recurred-thought to be secondary to vancomycin. Creatinine seems to have stabilized/plateaued. Nephrology following. Per nephrology, patient not a long-term candidate for hemodialysis.  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 Continue Sandostatin and loperamide. Remains   NPO-TNA-180 cc ostomy output yesterday. Per general surgery-currently a poor surgical candidate for repair of enterocutaneous fistula-needs to be optimized both nutritionally and physically before proceeding with surgery.  Sepsis secondary to coagulase negative Staphylococcus bacteremia: Likely catheter-related bacteremia-likely present on admission.PICC line has been discontinued.Port placed by IR on 11/14. Although recommendations were to stop vancomycin on 11/20-given worsening renal function-vancomycin was discontinued on 11/15. Dr. Tana Coast spoke with infectious disease M.D.-Dr Linus Salmons who recommended to observe off antibiotics. Remains afebrile and without leukocytosis. Note blood cultures on 11/7 and 11/8 negative so far.   Anemia: Secondary to acute on chronic illness/CKD- transfuse 2 units of PRBC on 11/15-follow hemoglobin. No overt evidence of any blood loss.  Proteus KMM:[id number], completed 5 days of empiric Rocephin.  Transaminitis: Resolved. Likely secondary to sepsis/shock liver. History of cholecystectomy. Abdominal ultrasound on 11/8 did not show any major intrahepatic/extrahepatic findings.  Acute encephalopathy: Likely secondary to sepsis-resolved. Although very deconditioned/weak-is awake and alert.  Acute on Chronic diastolic heart failure: much more compensated-suspect edema mostly from hypoalbuminemia-was given IV Lasix-will discontinue Lasix and monitor.  Oral thrush: Continue nystatin  Hyponatremia: Resolved.  Severe malnutrition with failure to thrive/Deconditioning:on TPN-PT/OT following. Patient remains very frustrated about her overall health.She seems to lack motivation for ambulation etc. Palliative care team met with family on 11/18-plans are to continue with current measures-family is helpful of eventual recovery. Family very much aware that if no recovering over the next few weeks-then will likely require transitioning to hospice care.Will likely d/c on 11/21 to  SNF  OSA (obstructive sleep apnea): continue CPAP  Depression: Continue Lexapro  Disposition: Remain inpatient-SNF 11/21  Antimicrobial agents  See below  Anti-infectives    Start     Dose/Rate Route Frequency Ordered Stop   07/27/15 1557  ceFAZolin (ANCEF) 2-3 GM-% IVPB SOLR  Status:  Discontinued    Comments:  Victorino Dike   : cabinet override      07/27/15 1557 07/27/15 1619   07/23/15 1000  vancomycin (VANCOCIN) 1,250 mg in sodium chloride 0.9 % 250 mL IVPB  Status:  Discontinued     1,250 mg 166.7 mL/hr over 90 Minutes Intravenous Every 48 hours 07/23/15 0908 07/28/15 1353   07/16/15 1000  vancomycin (VANCOCIN) 1,500 mg in sodium chloride 0.9 % 500 mL IVPB  Status:  Discontinued     1,500 mg 250 mL/hr over 120 Minutes Intravenous Every 48 hours 07/14/15 0956 07/15/15 1035   07/16/15 1000  vancomycin (VANCOCIN) IVPB 1000 mg/200 mL premix  Status:  Discontinued     1,000 mg 200 mL/hr over 60 Minutes Intravenous Every 48 hours 07/15/15 1035 07/20/15 1242   07/14/15 1500  cefTRIAXone (ROCEPHIN) 1 g in dextrose 5 % 50 mL IVPB  Status:  Discontinued     1 g 100 mL/hr over 30 Minutes Intravenous Every 24 hours 07/13/15 1647 07/17/15 1113   07/14/15 1015  vancomycin (VANCOCIN) 1,500 mg in sodium chloride 0.9 % 500 mL IVPB     1,500 mg 250 mL/hr over 120 Minutes Intravenous NOW 07/14/15 0943 07/14/15 1210   07/13/15 1400  cefTRIAXone (ROCEPHIN) 1 g in dextrose 5 % 50 mL IVPB     1 g 100 mL/hr over 30 Minutes Intravenous  Once 07/13/15 1359 07/13/15 1526      DVT Prophylaxis:  Prophylactic Heparin  Code Status:  DNR  Family Communication None at bedside  Procedures: IR placed tunneled right jugular central line on 11/14  CONSULTS:  general surgery   Renal  Time spent 30 minutes-Greater than 50% of this time was spent in counseling, explanation of diagnosis, planning of further management, and coordination of care.  MEDICATIONS: Scheduled Meds: . acetaminophen   500 mg Oral 3 times per day  . escitalopram  10 mg Oral Daily  . heparin subcutaneous  5,000 Units Subcutaneous 3 times per day  . insulin aspart  0-9 Units Subcutaneous 6 times per day  . loperamide  2 mg Oral 4 times per day  . modafinil  50 mg Oral Daily  . nystatin  5 mL Oral QID  . octreotide  100 mcg Subcutaneous Q12H  . pantoprazole (PROTONIX) IV  40 mg Intravenous Q24H  . potassium chloride  10 mEq Intravenous Q1 Hr x 3  . white petrolatum       Continuous Infusions: . TPN (CLINIMIX) Adult without lytes 118 mL/hr at 08/01/15 1940   And  . fat emulsion 234 mL (08/01/15 1821)  . Marland KitchenTPN (CLINIMIX-E) Adult     And  . fat emulsion     PRN Meds:.diphenhydrAMINE, diphenhydrAMINE-zinc acetate, Glycerin (Adult), HYDROmorphone (DILAUDID) injection, hydrOXYzine, iohexol, nitroGLYCERIN, ondansetron **OR** ondansetron (ZOFRAN) IV, oxyCODONE, sodium chloride, sodium chloride    PHYSICAL EXAM: Vital signs in last 24 hours: Filed Vitals:   08/01/15 2138 08/02/15 0535 08/02/15 0640 08/02/15 0907  BP: 117/65 138/61    Pulse: 77 96    Temp: 98.4 F (36.9 C) 99.5 F (37.5 C)    TempSrc: Oral Oral    Resp: 18 19    Height:      Weight:    104.9 kg (231 lb 4.2 oz)  SpO2: 97% 85% 98%     Weight change:  Filed Weights   07/29/15 0636 08/01/15 0439 08/02/15 0907  Weight: 109.362 kg (241 lb 1.6 oz) 105.8 kg (233 lb 4 oz) 104.9 kg (231 lb 4.2 oz)  Body mass index is 39.68 kg/(m^2).   Gen Exam: Awake and alert with clear but slow speech. Significantly deconditioned and weak. Neck: Supple, Chest: B/L Clear.   CVS: S1 S2 Regular, no murmurs.  Abdomen: Soft, midline wound, eakin pouch to wall suction Extremities: + edema, lower extremities warm to touch. Neurologic: Non Focal.       Intake/Output from previous day:  Intake/Output Summary (Last 24 hours) at 08/02/15 1141 Last data filed at 08/02/15 1025  Gross per 24 hour  Intake    150 ml  Output   1680 ml  Net  -1530 ml      LAB RESULTS: CBC  Recent Labs Lab 07/28/15 0620 07/29/15 0450 07/30/15 0603 08/02/15 0742  WBC 4.6 4.7 5.6 6.0  HGB 6.8* 8.5* 8.8* 8.6*  HCT 21.9* 27.4* 27.9* 28.0*  PLT 229 219 218 251  MCV 78.2 79.2 79.7 79.1  MCH 24.3* 24.6* 25.1* 24.3*  MCHC 31.1 31.0 31.5 30.7  RDW 14.5 14.7 15.2 14.9    Chemistries   Recent Labs Lab 07/29/15 0450 07/30/15 0603 07/31/15 0440 08/01/15 0528 08/02/15 0742  NA 141 139 139 136 133*  K 3.7 3.7 3.8 3.7 3.3*  CL 109 106 103 99* 97*  CO2 $Re'26 26 29 29 28  'joR$ GLUCOSE 135* 146* 133* 163* 167*  BUN 29* 37* 54* 64* 80*  CREATININE 3.37* 3.32* 3.44* 3.46* 3.57*  CALCIUM 8.2* 8.5* 8.4* 8.2* 8.1*  MG 1.8 1.7 1.8 2.0 1.7    CBG:  Recent Labs Lab 08/01/15 1155 08/01/15 1637 08/01/15 2051 08/01/15 2341 08/02/15 0801  GLUCAP 113* 84 176* 173* 198*    GFR Estimated Creatinine Clearance: 16.3 mL/min (by C-G formula based on Cr of 3.57).  Coagulation profile No results for input(s): INR, PROTIME in the last 168 hours.  Cardiac Enzymes No results for input(s): CKMB, TROPONINI, MYOGLOBIN in the last 168 hours.  Invalid input(s): CK  Invalid input(s): POCBNP No results for input(s): DDIMER in the last 72 hours. No results for input(s): HGBA1C in the last 72 hours. No results for input(s): CHOL, HDL, LDLCALC, TRIG, CHOLHDL, LDLDIRECT in the last 72 hours. No results for input(s): TSH, T4TOTAL, T3FREE, THYROIDAB in the last 72 hours.  Invalid input(s): FREET3 No results for input(s): VITAMINB12, FOLATE, FERRITIN, TIBC, IRON, RETICCTPCT in the last 72 hours. No results for input(s): LIPASE, AMYLASE in the last 72 hours.  Urine Studies No results for input(s): UHGB, CRYS in the last 72 hours.  Invalid input(s): UACOL, UAPR, USPG, UPH, UTP, UGL, UKET, UBIL, UNIT, UROB, ULEU, UEPI, UWBC, URBC, UBAC, CAST, UCOM, BILUA  MICROBIOLOGY: No results found for this or any previous visit (from the past 240 hour(s)).  RADIOLOGY  STUDIES/RESULTS: Ct Abdomen Pelvis Wo Contrast  07/13/2015  CLINICAL DATA:  Subsequent encounter for nausea vomiting with low abdominal pain and increased drainage from fistula. EXAM: CT ABDOMEN AND PELVIS WITHOUT CONTRAST TECHNIQUE: Multidetector CT imaging of the abdomen and pelvis was performed following the standard protocol without IV contrast. COMPARISON:  06/04/2015. FINDINGS: Lower chest: Motion artifact with some atelectasis in the dependent bases. Hepatobiliary: No focal abnormality in the liver on this study without intravenous contrast. No evidence of hepatomegaly. Gallbladder is surgically absent. No intrahepatic or extrahepatic biliary dilation. Pancreas: No focal mass lesion. No dilatation of the main duct. No intraparenchymal cyst. No peripancreatic edema. Spleen: No splenomegaly. No focal mass lesion. Adrenals/Urinary Tract: No adrenal nodule or mass. No mass lesion evident in either kidney on this study without  intravenous contrast material. No evidence for hydroureteronephrosis. The urinary bladder appears normal for the degree of distention. Stomach/Bowel: Small hiatal hernia noted. Metallic foreign body identified along the posterior wall of the gastric fundus. This is been present on multiple prior studies is well. Duodenum is normally positioned as is the ligament of Treitz. No small bowel wall thickening. No small bowel dilatation. The terminal ileum is normal. The appendix is not visualized, but there is no edema or inflammation in the region of the cecum. Diverticuli are seen scattered along the entire length of the colon without CT findings of diverticulitis. Small bowel adhesions to the anterior abdominal wall again noted. There is soft tissue attenuation in the midline anterior abdominal wall, at the site of previous surgical wound. This is the area where enterocutaneous fistula was visualized on the previous study. No gas in this region today but no oral contrast was administered to  allow assessment of the fistula. Vascular/Lymphatic: There is abdominal aortic atherosclerosis without aneurysm. There is no gastrohepatic or hepatoduodenal ligament lymphadenopathy. No intraperitoneal or retroperitoneal lymphadenopy. No pelvic sidewall lymphadenopathy. Reproductive: Uterus is surgically absent. There is no adnexal mass. Other: No intraperitoneal free fluid. Musculoskeletal: Bone windows reveal no worrisome lytic or sclerotic osseous lesions. IMPRESSION: Stable exam. No new or acute interval findings. There is no gas in the region of the chronic enterocutaneous fistula no evidence to suggest interval development of an abscess in this region. Fistula not well assessed given the lack of oral contrast on today's study. No evidence for bowel obstruction. No intraperitoneal fluid collection and no intraperitoneal free fluid. Electronically Signed   By: Misty Stanley M.D.   On: 07/13/2015 13:27   Dg Chest 2 View  07/13/2015  CLINICAL DATA:  Emesis.  CHF. EXAM: CHEST  2 VIEW COMPARISON:  06/04/2015 chest radiograph FINDINGS: Right PICC terminates in the lower third of the superior vena cava. Stable cardiomediastinal silhouette with mild cardiomegaly. No pneumothorax. No pleural effusion. Clear lungs, with no focal lung consolidation and no pulmonary edema. IMPRESSION: Stable mild cardiomegaly without pulmonary edema. Lungs appear clear. Electronically Signed   By: Ilona Sorrel M.D.   On: 07/13/2015 12:43   US Abdomen Complete  07/21/2015  CLINICAL DATA:  Elevated liver function studies. EXAM: ULTRASOUND ABDOMEN COMPLETE COMPARISON:  Multiple prior ultrasound and CT examinations. FINDINGS: Gallbladder: Surgically absent Common bile duct: Diameter: 7.1 mm Liver: Normal echogenicity without focal lesion or biliary dilatation. IVC: Normal caliber Pancreas: Poorly visualized but normal on recent CT scans. Spleen: Normal size.  No focal lesions. Right Kidney: Length: 10.4 cm. Mild renal cortical thinning  but no mass or hydronephrosis. Left Kidney: Length: 10.3 cm. Mild renal cortical thinning but no mass or hydronephrosis. Abdominal aorta: Normal caliber Other findings: None. IMPRESSION: Status post cholecystectomy with mild associated common bile duct dilatation. Normal sonographic appearance of the liver. No intrahepatic biliary dilatation. Poor visualization of the pancreas but it was normal on recent CT scans. Electronically Signed   By: Marijo Sanes M.D.   On: 07/21/2015 23:43   US Renal  07/17/2015  CLINICAL DATA:  Renal insufficiency EXAM: RENAL ULTRASOUND COMPARISON:  Renal ultrasound June 08, 2015; CT abdomen and pelvis July 13, 2015 FINDINGS: Right Kidney: Length: 11.1 cm. Echogenicity and renal cortical thickness are within normal limits. No perinephric fluid or hydronephrosis visualized. There is a cyst in the upper pole region measuring 1.5 x 1.5 x 1.5 cm. No sonographically demonstrable calculus or ureterectasis. Left Kidney: Length: 10.8 cm. Echogenicity and renal  cortical thickness are within normal limits. No mass, perinephric fluid, or hydronephrosis visualized. No sonographically demonstrable calculus. Bladder: Completely empty and cannot be assessed. IMPRESSION: Cyst arising from upper pole right kidney. Study otherwise unremarkable. Note the urinary bladder is empty and cannot be assessed. Electronically Signed   By: Lowella Grip III M.D.   On: 07/17/2015 11:45   Ir Fluoro Guide Cv Line Right  07/27/2015  INDICATION: 74 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 Patient also has chronic kidney disease. Patient needs IV access for TPN and antibiotics. EXAM: FLUOROSCOPIC AND ULTRASOUND GUIDED PLACEMENT OF A TUNNELED CENTRAL VENOUS CATHETER Physician: Stephan Minister. Henn, MD FLUOROSCOPY TIME:  54 seconds, 23 mGy MEDICATIONS: 1 mg Versed, 25 mcg fentanyl. A radiology nurse monitored the patient for moderate sedation. ANESTHESIA/SEDATION: Moderate sedation time: 20 minutes PROCEDURE:  Informed consent was obtained for placement of a tunneled central venous catheter. The patient was placed supine on the interventional table. Ultrasound confirmed a patent right internal jugularvein. Ultrasound images were obtained for documentation. The right side of the neck was prepped and draped in a sterile fashion. The right side of the neck was anesthetized with 1% lidocaine. Maximal barrier sterile technique was utilized including caps, mask, sterile gowns, sterile gloves, sterile drape, hand hygiene and skin antiseptic. A small incision was made with #11 blade scalpel. A 21 gauge needle directed into the right internal jugular vein with ultrasound guidance. A micropuncture dilator set was placed. A dual lumen Powerline catheter was selected. The skin below the right clavicle was anesthetized and a small incision was made with an #11 blade scalpel. A subcutaneous tunnel was formed to the vein dermatotomy site. The catheter was brought through the tunnel and cut to 22 cm. The vein dermatotomy site was dilated to accommodate a peel-away sheath. The catheter was placed through the peel-away sheath and directed into the central venous structures. The tip of the catheter was placed at the superior cavoatrial junction with fluoroscopy. Fluoroscopic images were obtained for documentation. Both lumens were found to aspirate and flush well. The vein dermatotomy site was closed using a single layer of absorbable suture and Dermabond. The catheter was secured to the skin using Prolene suture. FINDINGS: Catheter tip at the superior cavoatrial junction. Estimated blood loss: Minimal COMPLICATIONS: None IMPRESSION: Successful placement of a right jugular tunneled central venous catheter using ultrasound and fluoroscopic guidance. Electronically Signed   By: Markus Daft M.D.   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 Patient also has chronic  kidney disease. Patient needs IV access for TPN and antibiotics. EXAM: FLUOROSCOPIC AND ULTRASOUND GUIDED PLACEMENT OF A TUNNELED CENTRAL VENOUS CATHETER Physician: Stephan Minister. Henn, MD FLUOROSCOPY TIME:  54 seconds, 23 mGy MEDICATIONS: 1 mg Versed, 25 mcg fentanyl. A radiology nurse monitored the patient for moderate sedation. ANESTHESIA/SEDATION: Moderate sedation time: 20 minutes PROCEDURE: Informed consent was obtained for placement of a tunneled central venous catheter. The patient was placed supine on the interventional table. Ultrasound confirmed a patent right internal jugularvein. Ultrasound images were obtained for documentation. The right side of the neck was prepped and draped in a sterile fashion. The right side of the neck was anesthetized with 1% lidocaine. Maximal barrier sterile technique was utilized including caps, mask, sterile gowns, sterile gloves, sterile drape, hand hygiene and skin antiseptic. A small incision was made with #11 blade scalpel. A 21 gauge needle directed into the right internal jugular vein with ultrasound guidance. A micropuncture  dilator set was placed. A dual lumen Powerline catheter was selected. The skin below the right clavicle was anesthetized and a small incision was made with an #11 blade scalpel. A subcutaneous tunnel was formed to the vein dermatotomy site. The catheter was brought through the tunnel and cut to 22 cm. The vein dermatotomy site was dilated to accommodate a peel-away sheath. The catheter was placed through the peel-away sheath and directed into the central venous structures. The tip of the catheter was placed at the superior cavoatrial junction with fluoroscopy. Fluoroscopic images were obtained for documentation. Both lumens were found to aspirate and flush well. The vein dermatotomy site was closed using a single layer of absorbable suture and Dermabond. The catheter was secured to the skin using Prolene suture. FINDINGS: Catheter tip at the superior  cavoatrial junction. Estimated blood loss: Minimal COMPLICATIONS: None IMPRESSION: Successful placement of a right jugular tunneled central venous catheter using ultrasound and fluoroscopic guidance. Electronically Signed   By: Markus Daft M.D.   On: 07/27/2015 17:40   Dg Chest Port 1 View  07/25/2015  CLINICAL DATA:  PICC line placement EXAM: PORTABLE CHEST 1 VIEW COMPARISON:  07/13/2015 FINDINGS: Left PICC line tip is at the innominate SVC junction. This could be advanced 5 cm to the SVC RA junction. Normal heart size and vascularity. Minor streaky left base atelectasis. Right lung remains clear. No edema, effusion, or pneumothorax. Trachea midline. Atherosclerosis of the aorta. IMPRESSION: Left PICC line tip at the innominate SVC junction and can be advanced 5 cm to the SVC RA junction. Left base atelectasis Thoracic atherosclerosis Electronically Signed   By: Jerilynn Mages.  Shick M.D.   On: 07/25/2015 12:07   Dg Ugi W/small Bowel  07/20/2015  CLINICAL DATA:  74 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 Prior repair. Question level of fistula. Subsequent encounter. EXAM: UPPER GI SERIES WITH SMALL BOWEL FOLLOW-THROUGH FLUOROSCOPY TIME:  Radiation Exposure Index (as provided by the fluoroscopic device): 1634.12 micro Gy TECHNIQUE: Water-soluble upper GI series followed by small bowel were obtained after discussion with Dr. Kieth Brightly. COMPARISON:  None. FINDINGS: Draining clips are noted on scout view. Patient was only able to ingest a small amount of Omnipaque 300 at one time. Total of 150 cc was ingested. The present examination was specifically tailored to evaluate for level of the enterocutaneous fistula. Gastroesophageal reflux noted. Patient was kept in the upright position to avoid aspiration. Scattered duodenal diverticulum. Nonspecific eggshell calcification right upper quadrant. Enterocutaneous fistulas best seen on lateral overhead imaging. This appears to arise from the proximal to mid ileum.  Distal aspect of the small bowel not visualized. Patient was tired of the exam at this point. Study terminated. IMPRESSION: Enterocutaneous fistulas best seen on lateral overhead imaging. This appears to arise from the proximal to mid ileum. Exam otherwise limited as noted above. Electronically Signed   By: Genia Del M.D.   On: 07/20/2015 15:46    Oren Binet, MD  [facility] Pager:[phone]  If 7PM-7AM, please contact night-coverage [email or web address] Password [email or web address] 08/02/2015, 11:41 AM   LOS: 20 days

## 2015-08-02 NOTE — Progress Notes (Signed)
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   No Known Allergies  Patient Measurements: Height: 5\' 4"  (162.6 cm) Weight: 231 lb 4.2 oz (104.9 kg) IBW/kg (Calculated) : 54.7    Vital Signs: Temp: 99.5 F (37.5 C) (11/20 0535) Temp Source: Oral (11/20 0535) BP: 138/61 mmHg (11/20 0535) Pulse Rate: 96 (11/20 0535) Intake/Output from previous day: 11/19 0701 - 11/20 0700 In: 150 [P.O.:120] Out: 680 [Urine:500; Drains:180] Intake/Output from this shift:    Labs:  Recent Labs  08/02/15 0742  WBC 6.0  HGB 8.6*  HCT 28.0*  PLT 251     Recent Labs  07/31/15 0440 08/01/15 0528 08/02/15 0742  NA 139 136 133*  K 3.8 3.7 3.3*  CL 103 99* 97*  CO2 29 29 28   GLUCOSE 133* 163* 167*  BUN 54* 64* 80*  CREATININE 3.44* 3.46* 3.57*  CALCIUM 8.4* 8.2* 8.1*  MG 1.8 2.0 1.7  PHOS 6.1* 4.6 4.0   Estimated Creatinine Clearance: 16.3 mL/min (by C-G formula based on Cr of 3.57).    Recent Labs  08/01/15 2051 08/01/15 2341 08/02/15 0801  GLUCAP 176* 173* 198*   Insulin Requirements in the past 12 hours:  5 units  Nutritional Goals: per RD note 11/15 1700-1900 kCal, 100-110 grams of protein per day  Current Nutrition:  NPO Clinimix 5/15 + lipids 20% cycled over 18 hours  Nutrition from PTA:  Per discussions/notes from [facility] was receiving cycled Clinimix 5/15 alternated with E 5/15, 2071ml over 12 hours (2000-0800). Per RN from facility [place], patient was receiving TPN during her entire stay, however patient's admission labs/dehydrated state as well as report from daughter make this questionable.  Assessment: 89 YOF on chronic TPN for a fistula who was admitted from [facility] with lethargy, 2 days of vomiting, increased drainage from fistula and severe dehydration. Her EC output was bilious and Eakin's pouch was removed at facility. CT scan negative for infection however  blood cultures from 10/31 were 2/2 CoNS. TPN held for line holiday and PICC was removed. Repeat blood cultures have been clear - and a new PICC was placed 11/10 to resume TPN. PICC removed 11/12 due to VTE above and below PICC line. TPN stopped 11/12.  Tunneled catheter placed by IR 11/14.  Resumed TPN 11/5.   Surgeries/Procedures: 04/28/15: wound exploration, explanation of abdominal mesh Multiple other abdominal surgeries GI: 190ml output yesterday. Continues on loperamide, octreotide, PPI IV. Pre-albumin 35.1 >>11.9  Endo: A1C this admission is 6.2 (up from 5.7 in July 2016) - CBGs mostly at goal but elevated x 1 this AM.  Lytes:K 3.3, Mg 1.7, phos 4 - likely needs alternating lytes vs no lytes  Renal: AoCKD III d/t dehydration - SCr up to 3.57. IVF stopped 11/15, UOP 0.2 ml/kg/hr Hepatobil: LFTs WNL, TG 84 (11/7) Pulm:  98% RA Cards: BP 138/61, HR 96, EF 55% ID: Completed antibiotics, continued on nystatin for thrush Best Practices Hep SQ  TPN Access: PICC placed 11/10 for TPN removed 11/12 11/14 tunneled catheter placed TPN start date: chronic TPN- was on at home prior to admission in Sept, and continued after discharge. resumed 11/1 at the hospital, off 11/4, resumed 11/10, off 11/12, resumed 11/15  Plan: - Cyle Clinimix E5/15 (WITH lytes - likely needs alternating lytes and no lytes) over 12 hours + lipids 20% at 20 ml/hr over 12 hours - provides 95gm protein/day (95% goal) and 1829 Kcal/day (100% goal)  -  Daily MVI and trace elements in TPN - Potassium chloride 61mEq IV Q1H x 3 doses - Continue SSI for now - F/u AM labs  Salome Arnt, PharmD, BCPS Pager # [phone] 08/02/2015 9:53 AM

## 2015-08-03 LAB — COMPREHENSIVE METABOLIC PANEL WITH GFR
ALT: 16 U/L (ref 14–54)
AST: 17 U/L (ref 15–41)
Albumin: 2 g/dL — ABNORMAL LOW (ref 3.5–5.0)
Alkaline Phosphatase: 80 U/L (ref 38–126)
Anion gap: 9 (ref 5–15)
BUN: 99 mg/dL — ABNORMAL HIGH (ref 6–20)
CO2: 23 mmol/L (ref 22–32)
Calcium: 8.4 mg/dL — ABNORMAL LOW (ref 8.9–10.3)
Chloride: 98 mmol/L — ABNORMAL LOW (ref 101–111)
Creatinine, Ser: 3.68 mg/dL — ABNORMAL HIGH (ref 0.44–1.00)
GFR calc Af Amer: 13 mL/min — ABNORMAL LOW
GFR calc non Af Amer: 11 mL/min — ABNORMAL LOW
Glucose, Bld: 158 mg/dL — ABNORMAL HIGH (ref 65–99)
Potassium: 4.2 mmol/L (ref 3.5–5.1)
Sodium: 130 mmol/L — ABNORMAL LOW (ref 135–145)
Total Bilirubin: 0.3 mg/dL (ref 0.3–1.2)
Total Protein: 6 g/dL — ABNORMAL LOW (ref 6.5–8.1)

## 2015-08-03 LAB — GLUCOSE, CAPILLARY
Glucose-Capillary: 110 mg/dL — ABNORMAL HIGH (ref 65–99)
Glucose-Capillary: 132 mg/dL — ABNORMAL HIGH (ref 65–99)
Glucose-Capillary: 142 mg/dL — ABNORMAL HIGH (ref 65–99)
Glucose-Capillary: 183 mg/dL — ABNORMAL HIGH (ref 65–99)
Glucose-Capillary: 202 mg/dL — ABNORMAL HIGH (ref 65–99)
Glucose-Capillary: 94 mg/dL (ref 65–99)

## 2015-08-03 LAB — PHOSPHORUS: Phosphorus: 4.5 mg/dL (ref 2.5–4.6)

## 2015-08-03 LAB — TRIGLYCERIDES: Triglycerides: 165 mg/dL — ABNORMAL HIGH

## 2015-08-03 LAB — MAGNESIUM: Magnesium: 1.8 mg/dL (ref 1.7–2.4)

## 2015-08-03 LAB — PREALBUMIN: PREALBUMIN: 12.5 mg/dL — AB (ref 18–38)

## 2015-08-03 MED ORDER — UNABLE TO FIND: Status: DC

## 2015-08-03 MED ORDER — TRACE MINERALS CR-CU-MN-SE-ZN 10-1000-500-60 MCG/ML IV SOLN
INTRAVENOUS | Status: DC
  Administered 2015-08-03: 18:00:00 via INTRAVENOUS
  Filled 2015-08-03: qty 1900

## 2015-08-03 MED ORDER — NYSTATIN 100000 UNIT/GM EX POWD
Freq: Two times a day (BID) | CUTANEOUS | Status: DC
Start: 2015-08-03 — End: 2015-08-04
  Administered 2015-08-03 (×2): via TOPICAL
  Filled 2015-08-03 (×2): qty 15

## 2015-08-03 MED ORDER — FAT EMULSION 20 % IV EMUL
240.0000 mL | INTRAVENOUS | Status: DC
  Administered 2015-08-03: 240 mL via INTRAVENOUS
  Filled 2015-08-03 (×2): qty 250

## 2015-08-03 NOTE — Progress Notes (Signed)
Nutrition Follow-up  DOCUMENTATION CODES:   Obesity unspecified  INTERVENTION:   -TPN management per pharmacy  NUTRITION DIAGNOSIS:   Inadequate oral intake related to inability to eat as evidenced by NPO status.  Ongoing  GOAL:   Patient will meet greater than or equal to 90% of their needs  Met with TPN  MONITOR:   Diet advancement, Weight trends, Labs, I & O's, Skin  REASON FOR ASSESSMENT:   Consult New TPN/TNA  ASSESSMENT:   74 y.o. 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  TPN was since 07/26/15, due to superficical thrombosis of left arm. ID placed tunneled rt jugular central line on 07/27/15. TPN re-started 07/28/15.   Per pharmacy note, pt receiving cyclic TPN of Clinimix 4/36 (WITH NO lytes - likely needs alternating lytes and no lytes) over 12 hours + lipids 20% at 20 ml/hr over 12 hours - provides 95gm protein/day (95% goal) and 1829 Kcal/day (100% goal).   Reviewed COWRN note from 11/121/16; pt remains with full thickness wound to midline abdomen with fistula; remains with eakin, connected to suction. Pt also 3 areas of full thickness wounds to upper abdomen; piece of eakin applied to upper abdominal wounds to protect. COWRN has applied stoma powder to midline wound to assist with drying and to promote healing.  CSW following; plan to return to SNF once medically stable.   Palliative care team following for goals of care and pain management.   Labs reviewed. Na: 130 (on IV supplementation), CBGS: 110-83.  Diet Order:  Diet NPO time specified Except for: Sips with Meds TPN (CLINIMIX) Adult without lytes  Skin:  Wound (see comment) (full thickness midline abd wound w fistula (eakin to suction), full thickness upper abdominal wound x3)  Last BM:  07/21/15  Height:   Ht Readings from Last 1  Encounters:  07/15/15 $RemoveB'5\' 4"'yHdIfUrP$  (1.626 m)    Weight:   Wt Readings from Last 1 Encounters:  08/02/15 231 lb 4.2 oz (104.9 kg)    Ideal Body Weight:  54.5 kg  BMI:  Body mass index is 39.68 kg/(m^2).  Estimated Nutritional Needs:   Kcal:  1700-1900  Protein:  100-110 grams  Fluid:  1.7-1.9 L/day  EDUCATION NEEDS:   No education needs identified at this time  Chelcy Bolda A. Jimmye Norman, RD, LDN, CDE Pager: [phone] After hours Pager: [phone]

## 2015-08-03 NOTE — Progress Notes (Signed)
Physical Therapy Treatment Patient Details Name: Marissa [name] MRN: [id number] DOB: 03-Jan-1941 Today's Date: 08/03/2015    History of Present Illness 74 yo female admitted 10/31 from SNF for dehydration, lethargy, renal failure, UTI and decreased PT intake after TPN stopped.  Pt with significant PMH of CKD, obstructive sleep apnea, DMII, HTN, hidradenitis suppurativa, diastolic heart failure, [id number] abdominal wound explorationof enterocutaneous fistula and absecess.  Pt currently with abdominal pouch/wound connected to wall suction.      PT Comments    Pt actively participated in therapy today, however, still seems to be limited in what she can do due to not wanting to be told what to do/wanting to do it on her own time. Pt seems to respond well when asked what she would like to do to begin/throughout treatment. Pt performed leg exercises in bed for a "warm up". Pt was able to sit EOB ~10 minutes while SPTA washed back/ applied petroleum jelly onto back/changed gown. Pt stated that she will "stand up next time". Pt's son's were present at the end of treatment.   Follow Up Recommendations  SNF     Equipment Recommendations  None recommended by PT       Precautions / Restrictions Precautions Precautions: Fall Precaution Comments: wound pouch in place mid-stomach region connected to wall suction.  Restrictions Weight Bearing Restrictions: No    Mobility  Bed Mobility Overal bed mobility: Needs Assistance Bed Mobility: Rolling;Sidelying to Sit Rolling: Min assist (min assistance to fully roll onto side) Sidelying to sit: Min assist (to move RLE off of bed)     Sit to sidelying: Min assist (raise RLE onto bed) General bed mobility comments: Pt required min assistance only for movement of RLE on and off bed.   Transfers Overall transfer level: Needs assistance Equipment used: Rolling walker (2 wheeled) Transfers: Sit to/from Stand Sit to Stand: Max assist         General  transfer comment: Attempted to stand from bed with one person max assistance, however, pt was unable to lift bottom from bed and would not attempt with +2 assistance.                                      Balance Overall balance assessment: Needs assistance Sitting-balance support: Feet supported Sitting balance-Leahy Scale: Good Sitting balance - Comments: Pt able to sit EOB with no UE support and weight shift side to side without losing balance.                            Cognition Arousal/Alertness: Awake/alert Behavior During Therapy: WFL for tasks assessed/performed                        Exercises General Exercises - Lower Extremity Ankle Circles/Pumps: AAROM;Both;10 reps;Supine Heel Slides: AAROM;Both;10 reps;Supine Straight Leg Raises: AROM;AAROM;Both;10 reps;Supine        Pertinent Vitals/Pain Pain Assessment: 0-10 Faces Pain Scale: Hurts even more Pain Location: whole body           PT Goals (current goals can now be found in the care plan section) Progress towards PT goals: Not progressing toward goals - comment (pt refused to stand up from bed)    Frequency  Min 2X/week    PT Plan Current plan remains appropriate       End of Session  Activity Tolerance: Other: Pt is self-limiting during treatment. Patient left: in bed;with call bell/phone within reach     Time: 1324-1413 PT Time Calculation (min) (ACUTE ONLY): 49 min  Charges:    2 TA [place], [employer] OFFICE  08/03/2015, 2:27 PM

## 2015-08-03 NOTE — Progress Notes (Signed)
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  No Known Allergies  Patient Measurements: Height: 5\' 4"  (162.6 cm) Weight: 231 lb 4.2 oz (104.9 kg) IBW/kg (Calculated) : 54.7 Adjusted Body Weight:  Usual Weight:   Vital Signs: Temp: 98.5 F (36.9 C) (11/21 0612) Temp Source: Oral (11/20 2120) BP: 102/42 mmHg (11/21 0612) Pulse Rate: 79 (11/21 0612) Intake/Output from previous day: 11/20 0701 - 11/21 0700 In: 3833.2 [id number] Out: 2175 [Urine:2150; Drains:25] Intake/Output from this shift:    Labs:  Recent Labs  08/02/15 0742  WBC 6.0  HGB 8.6*  HCT 28.0*  PLT 251     Recent Labs  08/01/15 0528 08/02/15 0742 08/03/15 0640  NA 136 133* 130*  K 3.7 3.3* 4.2  CL 99* 97* 98*  CO2 29 28 23   GLUCOSE 163* 167* 158*  BUN 64* 80* 99*  CREATININE 3.46* 3.57* 3.68*  CALCIUM 8.2* 8.1* 8.4*  MG 2.0 1.7 1.8  PHOS 4.6 4.0 4.5  PROT  --   --  6.0*  ALBUMIN  --   --  2.0*  AST  --   --  17  ALT  --   --  16  ALKPHOS  --   --  80  BILITOT  --   --  0.3  PREALBUMIN  --   --  12.5*  TRIG  --   --  165*   Estimated Creatinine Clearance: 15.8 mL/min (by C-G formula based on Cr of 3.68).    Recent Labs  08/03/15 0002 08/03/15 0423 08/03/15 0736  GLUCAP 142* 183* 110*    Medications:  Scheduled:  . acetaminophen  500 mg Oral 3 times per day  . escitalopram  10 mg Oral Daily  . heparin subcutaneous  5,000 Units Subcutaneous 3 times per day  . insulin aspart  0-9 Units Subcutaneous 6 times per day  . loperamide  2 mg Oral 4 times per day  . modafinil  50 mg Oral Daily  . nystatin  5 mL Oral QID  . octreotide  100 mcg Subcutaneous Q12H  . pantoprazole (PROTONIX) IV  40 mg Intravenous Q24H    Insulin Requirements in the past 12 hours:  7 units Sens SSI, no insulin in TPN  Nutritional Goals: per RD note 11/15 1700-1900 kCal, 100-110 grams of protein per day  Current Nutrition:   NPO Clinimix 5/15 + lipids 20% cycled over 18 hours  Nutrition from PTA:  Per discussions/notes from [facility], was receiving cycled Clinimix 5/15 alternated with E 5/15, 205ml over 12 hours (2000-0800). Per RN from facility [place], patient was receiving TPN during her entire stay, however patient's admission labs/dehydrated state as well as report from daughter make this questionable.  Assessment: 38 YOF on chronic TPN for a fistula who was admitted from [facility] with lethargy, 2 days of vomiting, increased drainage from fistula and severe dehydration. Her EC output was bilious and Eakin's pouch was removed at facility. CT scan negative for infection however blood cultures from 10/31 were 2/2 CoNS. TPN held for line holiday and PICC was removed. Repeat blood cultures have been clear - and a new PICC was placed 11/10 to resume TPN. PICC removed 11/12 due to VTE above and below PICC line. TPN stopped 11/12. Tunneled catheter placed by IR 11/14. Resumed TPN 11/5.   Surgeries/Procedures: 04/28/15: wound exploration, explanation of abdominal mesh Multiple other abdominal surgeries  GI: 153ml output yesterday. Continues on loperamide,  octreotide, PPI IV.  Endo: A1C this admission is 6.2 (up from 5.7 in July 2016) - CBGs < 150 except x 1 last PM.  Lytes:K 4.2- s/p Kruns x 3 11/20 (dropped after 2 days of TPN with no lytes), Mg & Phos wnl, Na 130, CoCa 10 - likely needs alternating lytes vs no lytes  Renal: AoCKD III d/t dehydration - SCr 3.68- cont trending up. IVF stopped 11/15, UOP 0.9 ml/kg/hr Hepatobil: LFTs WNL, TG 165, PAlb 125- sl upward trend Pulm: 98% RA Cards: BP soft, HR wnl, EF 55% ID: [id number], WBC wnl (11/20).  Completed antibiotics, continued on nystatin for thrush Best Practices Hep SQ  TPN Access: PICC placed 11/10 for TPN removed 11/12 11/14 tunneled catheter placed TPN start date: chronic TPN- was on at home prior to admission in Sept, and continued  after discharge. resumed 11/1 at the hospital, off 11/4, resumed 11/10, off 11/12, resumed 11/15  Plan: -  Cyle Clinimix 5/15 (WITH NO lytes - likely needs alternating lytes and no lytes) over 12 hours + lipids 20% at 20 ml/hr over 12 hours - provides 95gm protein/day (95% goal) and 1829 Kcal/day (100% goal)  - Daily MVI and trace elements in TPN - Continue SSI for now - F/u AM labs - Poss d/c to SNF today, but RN unaware of plan at this point.  Will f/u.   Gracy Bruins, PharmD Clinical Pharmacist [facility]

## 2015-08-03 NOTE — Consult Note (Addendum)
WOC wound follow up Current Eakin pouch changed. Wound type: Full thickness wound to midline abd with fistula, 6X.3X.1cm, with fistula site in the middle .2X.2X.2cm.  Wound beefy red.  Upper abd has 3 areas of full thickness wounds which are beefy red and almost connected, affected area is 1X.1X.1cm. Applied piece of Eakin over upper abd wounds to protect. Drainage (amount, consistency, odor) Small amt green drainage in cannister Periwound: Intact skin surrounding wound is less moist and beginning to dry and peel. Continue stoma powder dusting to protect exposed skin and promote healing. Dressing procedure/placement/frequency: Applied skin prep to protect skin, then applied small Eakin pouch with red rubber catheter to low wall suction to control drainage and promote healing. [place] team will continue to follow. Supplies at bedside and instructions provided for staff nurses if leakage occurs. Daughter has managed the fistula and pouching routines at home before, I feel that she would be able to perform these tasks again if the patient is discharged. Julien Girt MSN, RN, CWOCN, CWCN-AP, CNS

## 2015-08-03 NOTE — Progress Notes (Signed)
Patient continues to refuse CPAP at this time. Will call if changes mind.

## 2015-08-03 NOTE — Progress Notes (Signed)
Prescription for TPN on patient given to [facility] from [employer].

## 2015-08-03 NOTE — Consult Note (Addendum)
WOC consult requested by CCS team for exposed macerated skin surrounding Eakin pouch.  Refer to earlier progress note today for assessment and plan of care.  This problem is slowly improving since last week and skin is protected with stoma powder to promote drying and healing. Orders have been provided for staff nurses. Julien Girt MSN, RN, [place], [place], [place]

## 2015-08-03 NOTE — Progress Notes (Signed)
Subjective: Depressed and says she doesn't feel good.    Objective: Vital signs in last 24 hours: Temp:  [98.4 F (36.9 C)-98.6 F (37 C)] 98.5 F (36.9 C) (11/21 0612) Pulse Rate:  [79-88] 79 (11/21 0612) Resp:  [17-20] 17 (11/21 0612) BP: (99-116)/(42-46) 102/42 mmHg (11/21 0612) SpO2:  [98 %-100 %] 98 % (11/21 0612) Weight:  [104.9 kg (231 lb 4.2 oz)] 104.9 kg (231 lb 4.2 oz) (11/20 0907) Last BM Date: 07/26/15 NPO  All nutrition is via TNA 2150 urine No Bm  drain 25 recorded  Afebrile, VSS Na 230, creatinine is up to 3.68 Prealbumin 12.5 Intake/Output from previous day: 11/20 0701 - 11/21 0700 In: 3833.2 [id number] Out: 2175 [Urine:2150; Drains:25] Intake/Output this shift:    General appearance: alert, cooperative, no distress and depressed, feels bad. GI: see picture below      Lab Results:   Recent Labs  08/02/15 0742  WBC 6.0  HGB 8.6*  HCT 28.0*  PLT 251    BMET  Recent Labs  08/02/15 0742 08/03/15 0640  NA 133* 130*  K 3.3* 4.2  CL 97* 98*  CO2 28 23  GLUCOSE 167* 158*  BUN 80* 99*  CREATININE 3.57* 3.68*  CALCIUM 8.1* 8.4*   PT/INR No results for input(s): LABPROT, INR in the last 72 hours.   Recent Labs Lab 07/29/15 0450 07/30/15 0603 08/03/15 0640  AST 16 17 17   ALT 29 25 16   ALKPHOS 80 83 80  BILITOT 0.7 0.4 0.3  PROT 5.1* 5.6* 6.0*  ALBUMIN 1.8* 1.9* 2.0*     Lipase     Component Value Date/Time   LIPASE 27 12/26/2014 1815     Studies/Results: No results found.  Medications: . acetaminophen  500 mg Oral 3 times per day  . escitalopram  10 mg Oral Daily  . heparin subcutaneous  5,000 Units Subcutaneous 3 times per day  . insulin aspart  0-9 Units Subcutaneous 6 times per day  . loperamide  2 mg Oral 4 times per day  . modafinil  50 mg Oral Daily  . nystatin  5 mL Oral QID  . octreotide  100 mcg Subcutaneous Q12H  . pantoprazole (PROTONIX) IV  40 mg Intravenous Q24H   . TPN (CLINIMIX) Adult without  lytes     And  . fat emulsion     Prior to Admission medications   Medication Sig Start Date End Date Taking? Authorizing Provider  acetaminophen (TYLENOL) 325 MG tablet Take 650 mg by mouth every 6 (six) hours as needed (general discomfort).    Yes Historical Provider, MD  colchicine 0.6 MG tablet Take 0.6 mg by mouth daily.   Yes Historical Provider, MD  cyanocobalamin 2000 MCG tablet Take 1 tablet (2,000 mcg total) by mouth daily. 01/07/15  Yes Janith Lima, MD  diphenhydrAMINE (BENADRYL) 25 mg capsule Take 25 mg by mouth every 4 (four) hours as needed for itching.   Yes Historical Provider, MD  FeAsp-B12-FA-C-DSS-SuccAc-Zn (FERIVA 21/7) 75-1 MG TABS Take 1 tablet by mouth daily. 04/15/15  Yes Janith Lima, MD  indomethacin (INDOCIN) 50 MG capsule Take 50 mg by mouth 3 (three) times daily with meals. For 4 days   Yes Historical Provider, MD  insulin aspart (NOVOLOG) 100 UNIT/ML injection Sliding scale  CBG 70 - 120: 0 units: CBG 121 - 150: 2 units; CBG 151 - 200: 3 units; CBG 201 - 250: 5 units; CBG 251 - 300: 8 units;CBG 301 - 350: 11  units; CBG 351 - 400: 15 units; CBG > 400 : 15 units and notify MD Patient taking differently: Inject 2-15 Units into the skin 3 (three) times daily with meals. Sliding scale  CBG 70 - 120: 0 units: CBG 121 - 150: 2 units; CBG 151 - 200: 3 units; CBG 201 - 250: 5 units; CBG 251 - 300: 8 units;CBG 301 - 350: 11 units; CBG 351 - 400: 15 units; CBG > 400 : 15 units and notify MD 06/15/15  Yes Ripudeep K Rai, MD  nystatin (MYCOSTATIN) 100000 UNIT/ML suspension Take 5 mLs (500,000 Units total) by mouth 4 (four) times daily. 06/15/15  Yes Ripudeep Krystal Eaton, MD  octreotide (SANDOSTATIN) 100 MCG/ML SOLN injection Inject 1 mL (100 mcg total) into the skin every 12 (twelve) hours. 06/15/15  Yes Ripudeep Krystal Eaton, MD  oxycodone (OXY-IR) 5 MG capsule Take 5 mg by mouth every 6 (six) hours as needed (moderate to severe pain).    Yes Historical Provider, MD  pantoprazole (PROTONIX) 40  MG tablet Take 1 tablet (40 mg total) by mouth daily. 12/25/14  Yes Reyne Dumas, MD  traMADol (ULTRAM) 50 MG tablet Take 50 mg by mouth every 6 (six) hours as needed (pain).   Yes Historical Provider, MD  UNABLE TO FIND ADULT TPN per skilled nursing facility protocol QHS/every night (untreated discontinued by surgery). Patient taking differently: Inject 2,040 mLs into the vein every other day. ADULT TPN per skilled nursing facility protocol. Infuse over 12 hours 06/15/15  Yes Ripudeep K Rai, MD  VOLTAREN 1 % GEL Apply 2 g topically 4 (four) times daily as needed (pain).  12/25/14  Yes Historical Provider, MD  methocarbamol (ROBAXIN) 500 MG tablet Take 1 tablet (500 mg total) by mouth every 8 (eight) hours as needed for muscle spasms. 05/21/15   Saverio Danker, PA-C  NITROSTAT 0.4 MG SL tablet DISSOLVE ONE TABLET UNDER THE TONGUE EVERY 5 MINUTES AS NEEDED FOR CHEST PAIN.  DO NOT EXCEED A TOTAL OF 3 DOSES IN 15 MINUTES 03/30/15   Larey Dresser, MD     Assessment/Plan Enterocutaneous fistula with Eakin's pouch Acute on chronic renal failure Sepsis with coagulase negative Staphylococcus  bacteremia Proteus UTI Anemia Acute encephalopathy, most likely secondary to sepsis Acute on chronic DCHF Malnutrition Deconditioning OSA Depression DNR Antibiotics:  None DVT:  Heparin/SCD   Plan:  Is for her to return to SNF, I will check on wound care with nursing, especially the skin loss of abdomen around the EC fistula.  Nystatin powder in groins and under panus.  She will need to follow up with Dr. Georgette Dover  And I will check on that.      LOS: 21 days    Sweetie Giebler 08/03/2015

## 2015-08-03 NOTE — Progress Notes (Signed)
PATIENT DETAILS Name: Marissa [name] Age: 74 y.o. Sex: female Date of Birth: [birth year] Admit Date: 07/13/2015 Admitting Physician Waldemar Dickens, MD OIT:[id number] Ronnald Ramp, MD  Brief narrative: Patient is a 74 year old female with known history of enterocutaneous fistula-who was recently admitted on 9/22 for increased ostomy output with encephalopathy. She was treated with TNA/Sandostatin-a eakin pouch was placed, she was subsequently discharged to SNF on 10/4. Patient was readmitted on 10/31, after increased biliary drainage from her fistula along with biliary emesis. Patient was found to have acute on chronic kidney disease, coagulase negative Staphylococcus bacteremia. General surgery/infectious disease has been following patient. Her prior PICC line has been discontinued, and a Port-A-Cath has been placed. Unfortunately, hospital course has now been complicated by redevelopment of acute renal failure, along with severe failure to thrive syndrome. Plans are for definite surgical repair of enterocutaneous fistula once patient is nutritionally and physically ready. Currently remains NPO and on TNA-seen by Palliative care-after discussion with family-tentative plans are for SNF on discharge  Subjective: Sleeping-does not seem motivated to [facility] weak and deconditioned.  Assessment/Plan: Principal Problem: Acute on chronic renal failure stage III: Felt to have acute renal failure secondary to prerenal azotemia from dehydration/high ostomy output which initially improved, but recurred-thought to be secondary to vancomycin. Creatinine was to have worsened again-spoke with nephrology-Dr. Deterdiing-advises to continue Lasix 40 mg BID on discharge. 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 Continue Sandostatin and loperamide. Remains  NPO-TNA-180 cc ostomy output yesterday. Per general surgery-currently a poor surgical candidate for repair of enterocutaneous fistula-needs to be optimized both nutritionally and physically before proceeding with surgery.Unfortunately given worsening renal function, major failure to thrive syndrome-may not be a surgical candidate in the near future. Palliative care following-family still hopeful of eventual recovery.  Sepsis secondary to coagulase negative Staphylococcus bacteremia: Likely catheter-related bacteremia-likely present on admission.PICC line has been discontinued.Port placed by IR on 11/14. Although recommendations were to stop vancomycin on 11/20-given worsening renal function-vancomycin was discontinued on 11/15. Dr. Tana Coast spoke with infectious disease M.D.-Dr Linus Salmons who recommended to observe off antibiotics. Remains afebrile and without leukocytosis. Note blood cultures on 11/7 and 11/8 negative so far.   Anemia: Secondary to acute on chronic illness/CKD- transfuse 2 units of PRBC on 11/15-follow hemoglobin. No overt evidence of any blood loss.  Proteus MEB:[id number], completed 5 days of empiric Rocephin.  Transaminitis: Resolved. Likely secondary to sepsis/shock liver. History of cholecystectomy. Abdominal ultrasound on 11/8 did not show any major intrahepatic/extrahepatic findings.  Acute encephalopathy: Likely secondary to sepsis-resolved. Although very deconditioned/weak-is awake and alert.  Acute on Chronic diastolic heart failure: much more compensated-suspect edema mostly from hypoalbuminemia-spoke with renal-Dr Deterding-advised to continue Lasix 40 mg BID on discharge.   Oral thrush: Continue nystatin  Hyponatremia: Resolved.  Severe malnutrition with failure to  thrive/Deconditioning:on TPN-PT/OT following. Patient remains very frustrated about her overall health and seems to lack motivation for ambulation etc. Palliative care team met with family on 11/18-plans are to continue with current measures-family is helpful of eventual recovery. Family very much aware that if no recovering over the next few weeks-then will likely require transitioning to hospice care.Will likely d/c on 11/22  to SNF  OSA (obstructive sleep apnea): continue CPAP  Depression: Continue Lexapro  Disposition: Remain inpatient-SNF 11/22  Antimicrobial agents  See below  Anti-infectives    Start     Dose/Rate Route Frequency Ordered Stop   07/27/15 1557  ceFAZolin (ANCEF) 2-3 GM-% IVPB SOLR  Status:  Discontinued    Comments:  [name]   : cabinet override      07/27/15 1557 07/27/15 1619   07/23/15 1000  vancomycin (VANCOCIN) 1,250 mg in sodium chloride 0.9 % 250 mL IVPB  Status:  Discontinued     1,250 mg 166.7 mL/hr over 90 Minutes Intravenous Every 48 hours 07/23/15 0908 07/28/15 1353   07/16/15 1000  vancomycin (VANCOCIN) 1,500 mg in sodium chloride 0.9 % 500 mL IVPB  Status:  Discontinued     1,500 mg 250 mL/hr over 120 Minutes Intravenous Every 48 hours 07/14/15 0956 07/15/15 1035   07/16/15 1000  vancomycin (VANCOCIN) IVPB 1000 mg/200 mL premix  Status:  Discontinued     1,000 mg 200 mL/hr over 60 Minutes Intravenous Every 48 hours 07/15/15 1035 07/20/15 1242   07/14/15 1500  cefTRIAXone (ROCEPHIN) 1 g in dextrose 5 % 50 mL IVPB  Status:  Discontinued     1 g 100 mL/hr over 30 Minutes Intravenous Every 24 hours 07/13/15 1647 07/17/15 1113   07/14/15 1015  vancomycin (VANCOCIN) 1,500 mg in sodium chloride 0.9 % 500 mL IVPB     1,500 mg 250 mL/hr over 120 Minutes Intravenous NOW 07/14/15 0943 07/14/15 1210   07/13/15 1400  cefTRIAXone (ROCEPHIN) 1 g in dextrose 5 % 50 mL IVPB     1 g 100 mL/hr over 30 Minutes Intravenous  Once 07/13/15 1359 07/13/15 1526        DVT Prophylaxis:  Prophylactic Heparin  Code Status:  DNR  Family Communication Spoke with daughter-Marissa [name] over the phone  Procedures: IR placed tunneled right jugular central line on 11/14  CONSULTS:  general surgery   Renal  Palliative  Time spent 30 minutes-Greater than 50% of this time was spent in counseling, explanation of diagnosis, planning of further management, and coordination of care.  MEDICATIONS: Scheduled Meds: . acetaminophen  500 mg Oral 3 times per day  . escitalopram  10 mg Oral Daily  . heparin subcutaneous  5,000 Units Subcutaneous 3 times per day  . insulin aspart  0-9 Units Subcutaneous 6 times per day  . loperamide  2 mg Oral 4 times per day  . modafinil  50 mg Oral Daily  . nystatin  5 mL Oral QID  . nystatin   Topical BID  . octreotide  100 mcg Subcutaneous Q12H  . pantoprazole (PROTONIX) IV  40 mg Intravenous Q24H   Continuous Infusions: . TPN (CLINIMIX) Adult without lytes     And  . fat emulsion     PRN Meds:.diphenhydrAMINE, diphenhydrAMINE-zinc acetate, Glycerin (Adult), HYDROmorphone (DILAUDID) injection, hydrOXYzine, iohexol, nitroGLYCERIN, ondansetron **OR** ondansetron (ZOFRAN) IV, oxyCODONE, sodium chloride, sodium chloride    PHYSICAL EXAM: Vital signs in last 24 hours: Filed Vitals:   08/02/15 0907 08/02/15 1341 08/02/15 2120 08/03/15 0612  BP:  99/46 116/45 102/42  Pulse:  87 88 79  Temp:  98.4 F (36.9 C) 98.6 F (37 C) 98.5 F (36.9 C)  TempSrc:  Oral Oral   Resp:  $Remo'20 18 17  'ylasb$ Height:      Weight: 104.9 kg (231 lb 4.2 oz)     SpO2:  100% 100% 98%  Weight change:  Filed Weights   07/29/15 0636 08/01/15 0439 08/02/15 0907  Weight: 109.362 kg (241 lb 1.6 oz) 105.8 kg (233 lb 4 oz) 104.9 kg (231 lb 4.2 oz)   Body mass index is 39.68 kg/(m^2).   Gen Exam: Awake and alert with clear but slow speech. Significantly deconditioned and weak. Neck: Supple, Chest: B/L Clear.   CVS: S1 S2 Regular, no murmurs.   Abdomen: Soft, midline wound, eakin pouch to wall suction Extremities: + edema, lower extremities warm to touch. Neurologic: Non Focal.       Intake/Output from previous day:  Intake/Output Summary (Last 24 hours) at 08/03/15 1349 Last data filed at 08/02/15 2200  Gross per 24 hour  Intake 3833.22 ml  Output    475 ml  Net 3358.22 ml     LAB RESULTS: CBC  Recent Labs Lab 07/28/15 0620 07/29/15 0450 07/30/15 0603 08/02/15 0742  WBC 4.6 4.7 5.6 6.0  HGB 6.8* 8.5* 8.8* 8.6*  HCT 21.9* 27.4* 27.9* 28.0*  PLT 229 219 218 251  MCV 78.2 79.2 79.7 79.1  MCH 24.3* 24.6* 25.1* 24.3*  MCHC 31.1 31.0 31.5 30.7  RDW 14.5 14.7 15.2 14.9    Chemistries   Recent Labs Lab 07/30/15 0603 07/31/15 0440 08/01/15 0528 08/02/15 0742 08/03/15 0640  NA 139 139 136 133* 130*  K 3.7 3.8 3.7 3.3* 4.2  CL 106 103 99* 97* 98*  CO2 $Re'26 29 29 28 23  'hcB$ GLUCOSE 146* 133* 163* 167* 158*  BUN 37* 54* 64* 80* 99*  CREATININE 3.32* 3.44* 3.46* 3.57* 3.68*  CALCIUM 8.5* 8.4* 8.2* 8.1* 8.4*  MG 1.7 1.8 2.0 1.7 1.8    CBG:  Recent Labs Lab 08/02/15 2009 08/03/15 0002 08/03/15 0423 08/03/15 0736 08/03/15 1201  GLUCAP 163* 142* 183* 110* 132*    GFR Estimated Creatinine Clearance: 15.8 mL/min (by C-G formula based on Cr of 3.68).  Coagulation profile No results for input(s): INR, PROTIME in the last 168 hours.  Cardiac Enzymes No results for input(s): CKMB, TROPONINI, MYOGLOBIN in the last 168 hours.  Invalid input(s): CK  Invalid input(s): POCBNP No results for input(s): DDIMER in the last 72 hours. No results for input(s): HGBA1C in the last 72 hours.  Recent Labs  08/03/15 0640  TRIG 165*   No results for input(s): TSH, T4TOTAL, T3FREE, THYROIDAB in the last 72 hours.  Invalid input(s): FREET3 No results for input(s): VITAMINB12, FOLATE, FERRITIN, TIBC, IRON, RETICCTPCT in the last 72 hours. No results for input(s): LIPASE, AMYLASE in the last 72 hours.  Urine  Studies No results for input(s): UHGB, CRYS in the last 72 hours.  Invalid input(s): UACOL, UAPR, USPG, UPH, UTP, UGL, UKET, UBIL, UNIT, UROB, ULEU, UEPI, UWBC, URBC, UBAC, CAST, UCOM, BILUA  MICROBIOLOGY: No results found for this or any previous visit (from the past 240 hour(s)).  RADIOLOGY STUDIES/RESULTS: Ct Abdomen Pelvis Wo Contrast  07/13/2015  CLINICAL DATA:  Subsequent encounter for nausea vomiting with low abdominal pain and increased drainage from fistula. EXAM: CT ABDOMEN AND PELVIS WITHOUT CONTRAST TECHNIQUE: Multidetector CT imaging of the abdomen and pelvis was performed following the standard protocol without IV contrast. COMPARISON:  06/04/2015. FINDINGS: Lower chest: Motion artifact with some atelectasis in the dependent bases. Hepatobiliary: No focal abnormality in the liver on this study without intravenous contrast. No evidence of hepatomegaly. Gallbladder is surgically absent. No intrahepatic or extrahepatic biliary dilation. Pancreas: No focal mass lesion. No dilatation of the main duct. No intraparenchymal cyst.  No peripancreatic edema. Spleen: No splenomegaly. No focal mass lesion. Adrenals/Urinary Tract: No adrenal nodule or mass. No mass lesion evident in either kidney on this study without intravenous contrast material. No evidence for hydroureteronephrosis. The urinary bladder appears normal for the degree of distention. Stomach/Bowel: Small hiatal hernia noted. Metallic foreign body identified along the posterior wall of the gastric fundus. This is been present on multiple prior studies is well. Duodenum is normally positioned as is the ligament of Treitz. No small bowel wall thickening. No small bowel dilatation. The terminal ileum is normal. The appendix is not visualized, but there is no edema or inflammation in the region of the cecum. Diverticuli are seen scattered along the entire length of the colon without CT findings of diverticulitis. Small bowel adhesions to the  anterior abdominal wall again noted. There is soft tissue attenuation in the midline anterior abdominal wall, at the site of previous surgical wound. This is the area where enterocutaneous fistula was visualized on the previous study. No gas in this region today but no oral contrast was administered to allow assessment of the fistula. Vascular/Lymphatic: There is abdominal aortic atherosclerosis without aneurysm. There is no gastrohepatic or hepatoduodenal ligament lymphadenopathy. No intraperitoneal or retroperitoneal lymphadenopy. No pelvic sidewall lymphadenopathy. Reproductive: Uterus is surgically absent. There is no adnexal mass. Other: No intraperitoneal free fluid. Musculoskeletal: Bone windows reveal no worrisome lytic or sclerotic osseous lesions. IMPRESSION: Stable exam. No new or acute interval findings. There is no gas in the region of the chronic enterocutaneous fistula no evidence to suggest interval development of an abscess in this region. Fistula not well assessed given the lack of oral contrast on today's study. No evidence for bowel obstruction. No intraperitoneal fluid collection and no intraperitoneal free fluid. Electronically Signed   By: Misty Stanley M.D.   On: 07/13/2015 13:27   Dg Chest 2 View  07/13/2015  CLINICAL DATA:  Emesis.  CHF. EXAM: CHEST  2 VIEW COMPARISON:  06/04/2015 chest radiograph FINDINGS: Right PICC terminates in the lower third of the superior vena cava. Stable cardiomediastinal silhouette with mild cardiomegaly. No pneumothorax. No pleural effusion. Clear lungs, with no focal lung consolidation and no pulmonary edema. IMPRESSION: Stable mild cardiomegaly without pulmonary edema. Lungs appear clear. Electronically Signed   By: Ilona Sorrel M.D.   On: 07/13/2015 12:43   US Abdomen Complete  07/21/2015  CLINICAL DATA:  Elevated liver function studies. EXAM: ULTRASOUND ABDOMEN COMPLETE COMPARISON:  Multiple prior ultrasound and CT examinations. FINDINGS: Gallbladder:  Surgically absent Common bile duct: Diameter: 7.1 mm Liver: Normal echogenicity without focal lesion or biliary dilatation. IVC: Normal caliber Pancreas: Poorly visualized but normal on recent CT scans. Spleen: Normal size.  No focal lesions. Right Kidney: Length: 10.4 cm. Mild renal cortical thinning but no mass or hydronephrosis. Left Kidney: Length: 10.3 cm. Mild renal cortical thinning but no mass or hydronephrosis. Abdominal aorta: Normal caliber Other findings: None. IMPRESSION: Status post cholecystectomy with mild associated common bile duct dilatation. Normal sonographic appearance of the liver. No intrahepatic biliary dilatation. Poor visualization of the pancreas but it was normal on recent CT scans. Electronically Signed   By: Marijo Sanes M.D.   On: 07/21/2015 23:43   US Renal  07/17/2015  CLINICAL DATA:  Renal insufficiency EXAM: RENAL ULTRASOUND COMPARISON:  Renal ultrasound June 08, 2015; CT abdomen and pelvis July 13, 2015 FINDINGS: Right Kidney: Length: 11.1 cm. Echogenicity and renal cortical thickness are within normal limits. No perinephric fluid or hydronephrosis visualized. There is  a cyst in the upper pole region measuring 1.5 x 1.5 x 1.5 cm. No sonographically demonstrable calculus or ureterectasis. Left Kidney: Length: 10.8 cm. Echogenicity and renal cortical thickness are within normal limits. No mass, perinephric fluid, or hydronephrosis visualized. No sonographically demonstrable calculus. Bladder: Completely empty and cannot be assessed. IMPRESSION: Cyst arising from upper pole right kidney. Study otherwise unremarkable. Note the urinary bladder is empty and cannot be assessed. Electronically Signed   By: Lowella Grip III M.D.   On: 07/17/2015 11:45   Ir Fluoro Guide Cv Line Right  07/27/2015  INDICATION: 74 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 Patient also has chronic kidney disease. Patient needs IV access for TPN and antibiotics. EXAM: FLUOROSCOPIC AND  ULTRASOUND GUIDED PLACEMENT OF A TUNNELED CENTRAL VENOUS CATHETER Physician: Stephan Minister. Henn, MD FLUOROSCOPY TIME:  54 seconds, 23 mGy MEDICATIONS: 1 mg Versed, 25 mcg fentanyl. A radiology nurse monitored the patient for moderate sedation. ANESTHESIA/SEDATION: Moderate sedation time: 20 minutes PROCEDURE: Informed consent was obtained for placement of a tunneled central venous catheter. The patient was placed supine on the interventional table. Ultrasound confirmed a patent right internal jugularvein. Ultrasound images were obtained for documentation. The right side of the neck was prepped and draped in a sterile fashion. The right side of the neck was anesthetized with 1% lidocaine. Maximal barrier sterile technique was utilized including caps, mask, sterile gowns, sterile gloves, sterile drape, hand hygiene and skin antiseptic. A small incision was made with #11 blade scalpel. A 21 gauge needle directed into the right internal jugular vein with ultrasound guidance. A micropuncture dilator set was placed. A dual lumen Powerline catheter was selected. The skin below the right clavicle was anesthetized and a small incision was made with an #11 blade scalpel. A subcutaneous tunnel was formed to the vein dermatotomy site. The catheter was brought through the tunnel and cut to 22 cm. The vein dermatotomy site was dilated to accommodate a peel-away sheath. The catheter was placed through the peel-away sheath and directed into the central venous structures. The tip of the catheter was placed at the superior cavoatrial junction with fluoroscopy. Fluoroscopic images were obtained for documentation. Both lumens were found to aspirate and flush well. The vein dermatotomy site was closed using a single layer of absorbable suture and Dermabond. The catheter was secured to the skin using Prolene suture. FINDINGS: Catheter tip at the superior cavoatrial junction. Estimated blood loss: Minimal COMPLICATIONS: None IMPRESSION:  Successful placement of a right jugular tunneled central venous catheter using ultrasound and fluoroscopic guidance. Electronically Signed   By: Markus Daft M.D.   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 Patient also has chronic kidney disease. Patient needs IV access for TPN and antibiotics. EXAM: FLUOROSCOPIC AND ULTRASOUND GUIDED PLACEMENT OF A TUNNELED CENTRAL VENOUS CATHETER Physician: Stephan Minister. Henn, MD FLUOROSCOPY TIME:  54 seconds, 23 mGy MEDICATIONS: 1 mg Versed, 25 mcg fentanyl. A radiology nurse monitored the patient for moderate sedation. ANESTHESIA/SEDATION: Moderate sedation time: 20 minutes PROCEDURE: Informed consent was obtained for placement of a tunneled central venous catheter. The patient was placed supine on the interventional table. Ultrasound confirmed a patent right internal jugularvein. Ultrasound images were obtained for documentation. The right side of the neck was prepped and draped in a sterile fashion. The right side of the neck was anesthetized with 1% lidocaine. Maximal barrier sterile technique was utilized including caps, mask, sterile gowns, sterile gloves, sterile drape, hand hygiene  and skin antiseptic. A small incision was made with #11 blade scalpel. A 21 gauge needle directed into the right internal jugular vein with ultrasound guidance. A micropuncture dilator set was placed. A dual lumen Powerline catheter was selected. The skin below the right clavicle was anesthetized and a small incision was made with an #11 blade scalpel. A subcutaneous tunnel was formed to the vein dermatotomy site. The catheter was brought through the tunnel and cut to 22 cm. The vein dermatotomy site was dilated to accommodate a peel-away sheath. The catheter was placed through the peel-away sheath and directed into the central venous structures. The tip of the catheter was placed at the superior cavoatrial junction with  fluoroscopy. Fluoroscopic images were obtained for documentation. Both lumens were found to aspirate and flush well. The vein dermatotomy site was closed using a single layer of absorbable suture and Dermabond. The catheter was secured to the skin using Prolene suture. FINDINGS: Catheter tip at the superior cavoatrial junction. Estimated blood loss: Minimal COMPLICATIONS: None IMPRESSION: Successful placement of a right jugular tunneled central venous catheter using ultrasound and fluoroscopic guidance. Electronically Signed   By: Markus Daft M.D.   On: 07/27/2015 17:40   Dg Chest Port 1 View  07/25/2015  CLINICAL DATA:  PICC line placement EXAM: PORTABLE CHEST 1 VIEW COMPARISON:  07/13/2015 FINDINGS: Left PICC line tip is at the innominate SVC junction. This could be advanced 5 cm to the SVC RA junction. Normal heart size and vascularity. Minor streaky left base atelectasis. Right lung remains clear. No edema, effusion, or pneumothorax. Trachea midline. Atherosclerosis of the aorta. IMPRESSION: Left PICC line tip at the innominate SVC junction and can be advanced 5 cm to the SVC RA junction. Left base atelectasis Thoracic atherosclerosis Electronically Signed   By: Jerilynn Mages.  Shick M.D.   On: 07/25/2015 12:07   Dg Ugi W/small Bowel  07/20/2015  CLINICAL DATA:  74 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 Prior repair. Question level of fistula. Subsequent encounter. EXAM: UPPER GI SERIES WITH SMALL BOWEL FOLLOW-THROUGH FLUOROSCOPY TIME:  Radiation Exposure Index (as provided by the fluoroscopic device): 1634.12 micro Gy TECHNIQUE: Water-soluble upper GI series followed by small bowel were obtained after discussion with Dr. Kieth Brightly. COMPARISON:  None. FINDINGS: Draining clips are noted on scout view. Patient was only able to ingest a small amount of Omnipaque 300 at one time. Total of 150 cc was ingested. The present examination was specifically tailored to evaluate for level of the enterocutaneous  fistula. Gastroesophageal reflux noted. Patient was kept in the upright position to avoid aspiration. Scattered duodenal diverticulum. Nonspecific eggshell calcification right upper quadrant. Enterocutaneous fistulas best seen on lateral overhead imaging. This appears to arise from the proximal to mid ileum. Distal aspect of the small bowel not visualized. Patient was tired of the exam at this point. Study terminated. IMPRESSION: Enterocutaneous fistulas best seen on lateral overhead imaging. This appears to arise from the proximal to mid ileum. Exam otherwise limited as noted above. Electronically Signed   By: Genia Del M.D.   On: 07/20/2015 15:46    Oren Binet, MD  [facility] Pager:[phone]  If 7PM-7AM, please contact night-coverage [email or web address] Password [email or web address] 08/03/2015, 1:49 PM   LOS: 21 days

## 2015-08-03 NOTE — Progress Notes (Signed)
Daily Progress Note   Patient Name: Marissa [name]       Date: 08/03/2015 DOB: November 11, 1940  Age: 74 y.o. MRN#: [id number] Attending Physician: Jonetta Osgood, MD Primary Care Physician: [name], MD Admit Date: 07/13/2015  Reason for Consultation/Follow-up: Non pain symptom management and Pain control follow up  Subjective: Marissa [name] is lying in bed. She appears to be sleeping but did briefly open her eyes when I entered her room. I talked to her with limited engagement. I asked if she needed anything and she nodded head no. I asked if she wanted to be left alone and she nodded head yes.   I did speak with daughter, Marissa [name], and updated her on her mother and my concerns over her creeping creatinine and worsening renal function. We discussed many aspects of her care and I answered many questions. Marissa [name] is struggling with mother's prognosis and I introduced the idea that her body is weak and failing and this is why her renal function is worsening. Marissa [name] acknowledges this idea but also fixated on other aspects of Marissa [name]'s care. Long conversation but Marissa [name] states no changes in their goal to hope for improvement although I believe she sees there are many barriers.   Interval Events: Started sch tylenol, lexapro as well as provigil  Length of Stay: 21 days  Current Medications: Scheduled Meds:  . acetaminophen  500 mg Oral 3 times per day  . escitalopram  10 mg Oral Daily  . heparin subcutaneous  5,000 Units Subcutaneous 3 times per day  . insulin aspart  0-9 Units Subcutaneous 6 times per day  . loperamide  2 mg Oral 4 times per day  . modafinil  50 mg Oral Daily  . nystatin  5 mL Oral QID  . nystatin   Topical BID  . octreotide  100 mcg Subcutaneous Q12H  . pantoprazole  (PROTONIX) IV  40 mg Intravenous Q24H    Continuous Infusions: . TPN (CLINIMIX) Adult without lytes     And  . fat emulsion      PRN Meds: diphenhydrAMINE, diphenhydrAMINE-zinc acetate, Glycerin (Adult), HYDROmorphone (DILAUDID) injection, hydrOXYzine, iohexol, nitroGLYCERIN, ondansetron **OR** ondansetron (ZOFRAN) IV, oxyCODONE, sodium chloride, sodium chloride  Physical Exam: Physical Exam  Constitutional: She appears well-developed.  HENT:  Head: Normocephalic.  Cardiovascular: Normal rate and regular  rhythm.   Abdominal: Soft.  Draining abd cutaneous fistula  Musculoskeletal:  Very weak. States her legs won't hold her up or move  Neurological:  Somnolent. Will open eyes to voice, answer simple brief questions  Skin: Skin is warm and dry.  abd wound  Psychiatric:  Affect constricted                Vital Signs: BP 97/46 mmHg  Pulse 87  Temp(Src) 98.5 F (36.9 C) (Oral)  Resp 17  Ht 5\' 4"  (1.626 m)  Wt 104.9 kg (231 lb 4.2 oz)  BMI 39.68 kg/m2  SpO2 100% SpO2: SpO2: 100 % O2 Device: O2 Device: Not Delivered O2 Flow Rate: O2 Flow Rate (L/min): 2 L/min  Intake/output summary:   Intake/Output Summary (Last 24 hours) at 08/03/15 1542 Last data filed at 08/03/15 1500  Gross per 24 hour  Intake 3833.22 ml  Output    475 ml  Net 3358.22 ml   LBM: Last BM Date: 07/26/15 Baseline Weight: Weight: 94.303 kg (207 lb 14.4 oz) Most recent weight: Weight: 104.9 kg (231 lb 4.2 oz)       Palliative Assessment/Data: Flowsheet Rows        Most Recent Value   Intake Tab    Referral Department  Hospitalist   Unit at Time of Referral  Med/Surg Unit   Palliative Care Primary Diagnosis  Sepsis/Infectious Disease   Date Notified  07/29/15   Palliative Care Type  New Palliative care   Reason for referral  Clarify Goals of Care, Pain   Date of Admission  07/13/15   Date first seen by Palliative Care  07/31/15   # of days Palliative referral response time  2 Day(s)   # of  days IP prior to Palliative referral  16   Clinical Assessment    Palliative Performance Scale Score  40%   Pain Max last 24 hours  Not able to report   Pain Min Last 24 hours  Not able to report   Dyspnea Max Last 24 Hours  Not able to report   Dyspnea Min Last 24 hours  Not able to report   Nausea Max Last 24 Hours  Not able to report   Nausea Min Last 24 Hours  Not able to report   Anxiety Max Last 24 Hours  Not able to report   Anxiety Min Last 24 Hours  Not able to report   Other Max Last 24 Hours  Not able to report   Psychosocial & Spiritual Assessment    Social Work Plan of Care  Offered and declined   Palliative Care Outcomes    Patient/Family meeting held?  Yes   Who was at the meeting?  daughter, son and DIL   Palliative Care Outcomes  Clarified goals of care   Patient/Family wishes: Interventions discontinued/not started   Antibiotics, Tube feedings/TPN   Palliative Care follow-up planned  Yes, Facility   Other Treatment Preference Instructions  wants LTAC if possible      Additional Data Reviewed: CBC    Component Value Date/Time   WBC 6.0 08/02/2015 0742   RBC 3.54* 08/02/2015 0742   RBC 4.06 04/28/2015 1915   HGB 8.6* 08/02/2015 0742   HCT 28.0* 08/02/2015 0742   PLT 251 08/02/2015 0742   MCV 79.1 08/02/2015 0742   MCH 24.3* 08/02/2015 0742   MCHC 30.7 08/02/2015 0742   RDW 14.9 08/02/2015 0742   LYMPHSABS 1.0 07/20/2015 1020   MONOABS 0.9 07/20/2015  1020   EOSABS 0.5 07/20/2015 1020   BASOSABS 0.1 07/20/2015 1020    CMP     Component Value Date/Time   NA 130* 08/03/2015 0640   K 4.2 08/03/2015 0640   CL 98* 08/03/2015 0640   CO2 23 08/03/2015 0640   GLUCOSE 158* 08/03/2015 0640   BUN 99* 08/03/2015 0640   CREATININE 3.68* 08/03/2015 0640   CREATININE 1.64* 06/27/2014 1647   CALCIUM 8.4* 08/03/2015 0640   PROT 6.0* 08/03/2015 0640   ALBUMIN 2.0* 08/03/2015 0640   AST 17 08/03/2015 0640   ALT 16 08/03/2015 0640   ALKPHOS 80 08/03/2015 0640    BILITOT 0.3 08/03/2015 0640   GFRNONAA 11* 08/03/2015 0640   GFRAA 13* 08/03/2015 0640       Problem List:  Patient Active Problem List   Diagnosis Date Noted  . Palliative care encounter   . Acute kidney injury ([place])   . Acute on chronic renal failure ([place]) 07/13/2015  . Metabolic acidosis 123456  . Urinary tract infection 07/13/2015  . Oral thrush 07/13/2015  . Dehydration 06/04/2015  . Encephalopathy 06/04/2015  . Abdominal pain 06/04/2015  . Acute renal failure ([place]) 06/04/2015  . Hyponatremia 05/10/2015  . Acute idiopathic gout of left ankle   . Hyperkalemia 05/04/2015  . Type 2 diabetes mellitus without complication ([place])   . D-dimer, elevated 04/16/2015  . Precordial chest pain 04/15/2015  . 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 Abdominal wall abscess 03/13/2015  . Non-compliant behavior 01/22/2015  . Anemia, iron deficiency 12/21/2014  . Diverticulosis of colon with hemorrhage 12/20/2014  . Chronic diastolic CHF (congestive heart failure) ([place]) 06/29/2014  . Routine general medical examination at a health care facility 05/30/2014  . Dementia arising in the senium and presenium 03/29/2013  . Acute gouty arthritis 02/15/2012  . Other screening mammogram 02/15/2012  . Osteopenia 02/15/2012  . 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 Hyperlipidemia with target LDL less than 100 10/06/2011  . OSA (obstructive sleep apnea) 04/02/2009  . GERD 08/20/2006  . Gout 08/15/2006  . Essential hypertension 08/15/2006  . CAD S/P OM BMS 8/07, ISR-OM DES 8/09 08/15/2006  . Chronic kidney disease, stage 3 08/15/2006  . DVT, HX OF 08/15/2006     Palliative Care Assessment & Plan    1.Code Status:  DNR    Code Status Orders        Start     Ordered   07/13/15 1835  Do not attempt resuscitation (DNR)   Continuous    Question Answer Comment  In the event of cardiac or respiratory ARREST Do not call a "code blue"   In the event of cardiac or respiratory ARREST Do  not perform Intubation, CPR, defibrillation or ACLS   In the event of cardiac or respiratory ARREST Use medication by any route, position, wound care, and other measures to relive pain and suffering. May use oxygen, suction and manual treatment of airway obstruction as needed for comfort.      07/13/15 1834       2. Goals of Care/Additional Recommendations:  For SNF.   Limitations on Scope of Treatment: Full Scope Treatment except for DNR/DNI  Desire for further Chaplaincy support:no  3. Symptom Management:       1. Pain: Improving. Cont scheduled tylenol and prn dilaudid.        2. Depression: Cont lexapro and Provigil for effect. No adverse effects. Marissa [name] is happy with these medications and feels she has seen benefit from them.  4. Palliative Prophylaxis:   Bowel Regimen  5. Prognosis: Likely less than 6 months given FTT as well as enterocutaneous fistula and likely unable to have surgical repair in setting of worsening renal function. Likely even weeks with gradually worsening renal function.   6. Discharge Planning:  [facility] for rehab with Palliative care service follow-up   Care plan was discussed with Dr. Sloan Leiter  Thank you for allowing the Palliative Medicine Team to assist in the care of this patient.   Time In: 1300 Time Out: 1330 Total Time 30 min Prolonged Time Billed  no         Pershing Proud, NP  [id number], 3:42 PM  Please contact Palliative Medicine Team phone at [phone] for questions and concerns.

## 2015-08-04 DIAGNOSIS — T83511D Infection and inflammatory reaction due to indwelling urethral catheter, subsequent encounter: Secondary | ICD-10-CM

## 2015-08-04 DIAGNOSIS — N390 Urinary tract infection, site not specified: Secondary | ICD-10-CM

## 2015-08-04 DIAGNOSIS — E871 Hypo-osmolality and hyponatremia: Secondary | ICD-10-CM

## 2015-08-04 LAB — BASIC METABOLIC PANEL WITH GFR
Anion gap: 9 (ref 5–15)
BUN: 109 mg/dL — ABNORMAL HIGH (ref 6–20)
CO2: 23 mmol/L (ref 22–32)
Calcium: 8.3 mg/dL — ABNORMAL LOW (ref 8.9–10.3)
Chloride: 100 mmol/L — ABNORMAL LOW (ref 101–111)
Creatinine, Ser: 3.62 mg/dL — ABNORMAL HIGH (ref 0.44–1.00)
GFR calc Af Amer: 13 mL/min — ABNORMAL LOW
GFR calc non Af Amer: 11 mL/min — ABNORMAL LOW
Glucose, Bld: 171 mg/dL — ABNORMAL HIGH (ref 65–99)
Potassium: 3.5 mmol/L (ref 3.5–5.1)
Sodium: 132 mmol/L — ABNORMAL LOW (ref 135–145)

## 2015-08-04 LAB — GLUCOSE, CAPILLARY
GLUCOSE-CAPILLARY: 216 mg/dL — AB (ref 65–99)
Glucose-Capillary: 108 mg/dL — ABNORMAL HIGH (ref 65–99)
Glucose-Capillary: 132 mg/dL — ABNORMAL HIGH (ref 65–99)
Glucose-Capillary: 186 mg/dL — ABNORMAL HIGH (ref 65–99)

## 2015-08-04 MED ORDER — HYDROXYZINE HCL 10 MG PO TABS: 5.0000 mg | ORAL_TABLET | Freq: Three times a day (TID) | ORAL | Status: DC | PRN

## 2015-08-04 MED ORDER — LOPERAMIDE HCL 2 MG PO CAPS: 2.0000 mg | ORAL_CAPSULE | Freq: Four times a day (QID) | ORAL | Status: DC

## 2015-08-04 MED ORDER — OXYCODONE HCL 5 MG PO CAPS: 10.0000 mg | ORAL_CAPSULE | Freq: Four times a day (QID) | ORAL | Status: DC | PRN

## 2015-08-04 MED ORDER — NYSTATIN 100000 UNIT/GM EX POWD: CUTANEOUS | Status: DC

## 2015-08-04 MED ORDER — ESCITALOPRAM OXALATE 10 MG PO TABS: 10.0000 mg | ORAL_TABLET | Freq: Every day | ORAL | Status: DC

## 2015-08-04 MED ORDER — INSULIN ASPART 100 UNIT/ML ~~LOC~~ SOLN
SUBCUTANEOUS | Status: DC
Start: 2015-08-04 — End: 2015-08-25

## 2015-08-04 MED ORDER — ACETAMINOPHEN 500 MG PO TABS: 500.0000 mg | ORAL_TABLET | Freq: Three times a day (TID) | ORAL | Status: DC

## 2015-08-04 MED ORDER — DIPHENHYDRAMINE HCL 12.5 MG/5ML PO ELIX: 25.0000 mg | ORAL_SOLUTION | Freq: Four times a day (QID) | ORAL | Status: DC | PRN

## 2015-08-04 MED ORDER — SODIUM CHLORIDE 0.9 % IV SOLN: INTRAVENOUS | Status: DC

## 2015-08-04 MED ORDER — HEPARIN SOD (PORK) LOCK FLUSH 100 UNIT/ML IV SOLN
250.0000 [IU] | INTRAVENOUS | Status: AC | PRN
  Administered 2015-08-04: 250 [IU]

## 2015-08-04 MED ORDER — WHITE PETROLATUM GEL
Status: AC
  Administered 2015-08-04: 0.2
  Filled 2015-08-04: qty 1

## 2015-08-04 MED ORDER — MODAFINIL 100 MG PO TABS: 50.0000 mg | ORAL_TABLET | Freq: Every day | ORAL | Status: DC

## 2015-08-04 NOTE — Discharge Summary (Signed)
PATIENT DETAILS Name: Marissa [name] Age: 74 y.o. Sex: female Date of Birth: 12-22-1940 MRN: [id number]. Admitting Physician: Waldemar Dickens, MD PIR:JJOACZ Ronnald Ramp, MD  Admit Date: 07/13/2015 Discharge date: 08/04/2015  Recommendations for Outpatient Follow-up:  1. Please consult Palliative care while at SNF 2. Please ensure follow up with Dr Charlesetta Ivory surgery 3. Please let Dr Georgette Dover know if significant leakage around wound site or significant output from fistula 4. NPO except meds-advance diet ONLY IF instructed by Gen Surgery/Dr Tsuei-On TNA 5. ADULT TPN including appropriate labs per skilled nursing facility protocol  6. Repeat BMET/CBC in 5 days 7. Consider referral to Nephrology for CKD  PRIMARY DISCHARGE DIAGNOSIS:  Principal Problem:   Acute on chronic renal failure (HCC) Active Problems:   OSA (obstructive sleep apnea)   Essential hypertension   Chronic kidney disease, stage 3   Chronic diastolic CHF (congestive heart failure) (HCC)   Enterocutaneous fistula   Type 2 diabetes mellitus without complication (HCC)   Hyponatremia   Dehydration   Metabolic acidosis   Urinary tract infection   Oral thrush   Acute kidney injury ([place])   Palliative care encounter      PAST MEDICAL HISTORY: Past Medical History  Diagnosis Date  . Hypertension   . Hyperlipidemia   . History of DVT (deep vein thrombosis)   . History of colonic polyps   . Gout   . Polyarthritis   . Microcytic anemia   . GERD (gastroesophageal reflux disease)   . Esophagitis   . Hidradenitis suppurativa     s/p axillary sweat gland removal  . H/O blood transfusion reaction     at [facility] hospital  . Diverticulitis   . Difficulty sleeping   . PMB (postmenopausal bleeding)     X 2 YRS  . Bowel trouble     OCCASIONAL BOWEL INCONTINENCE  . Fibroids     s/p TAH/BSO 07/2012  . CAD (coronary artery disease)     a. 8/07 had BMS to OM.  b. In-stent restenosis with later Promus DES to same site.  c.  Lexiscan myoview in 1/13 showed EF 67%, no ischemia or infarction.  d. Echo (2/13) showed EF 55-60%, moderate LVH, mild MR.  e. Lex MV 6/14: no isch, EF 53%;  f. Echo 6/14: mild LVH, EF 55-60%, Gr 1 DD, MAC, mild MR, mild LAE  . Peripheral neuropathy ([place])   . Chronic diastolic CHF (congestive heart failure) ([place]) 06/29/2014  . Renal insufficiency     baseline Cr ~ 1.3  . Chronic kidney disease (CKD), stage III (moderate)     Archie Endo 04/27/2015  . OSA on CPAP     "wear it sometimes" (04/27/2015)  . History of hiatal hernia   . YSAYTKZS(010.9)     "weekly" (04/27/2015)  . Enterocutaneous fistula     "have had it 2-3 months now" 04/27/2015  . Type II diabetes mellitus (HCC)     diet controlled: pt's daughter denies mother's hx of diabetes    DISCHARGE MEDICATIONS: Current Discharge Medication List    START taking these medications   Details  diphenhydrAMINE (BENADRYL) 12.5 MG/5ML elixir Take 10 mLs (25 mg total) by mouth every 6 (six) hours as needed for itching.    escitalopram (LEXAPRO) 10 MG tablet Take 1 tablet (10 mg total) by mouth daily.    hydrOXYzine (ATARAX/VISTARIL) 10 MG tablet Take 0.5 tablets (5 mg total) by mouth 3 (three) times daily as needed for itching.    loperamide (IMODIUM) 2 MG  capsule Take 1 capsule (2 mg total) by mouth every 6 (six) hours.    modafinil (PROVIGIL) 100 MG tablet Take 0.5 tablets (50 mg total) by mouth daily.    nystatin (MYCOSTATIN/NYSTOP) 100000 UNIT/GM POWD Apply to groins under abdomen that is wet most of the time. Refills: 0      CONTINUE these medications which have CHANGED   Details  acetaminophen (TYLENOL) 500 MG tablet Take 1 tablet (500 mg total) by mouth every 8 (eight) hours.    insulin aspart (NOVOLOG) 100 UNIT/ML injection 0-9 Units, Subcutaneous, Every 4 hours (6 times per day) CBG < 70: implement hypoglycemia protocol CBG 70 - 120: 0 units CBG 121 - 150: 1 unit CBG 151 - 200: 2 units CBG 201 - 250: 3 units CBG 251 - 300:  5 units CBG 301 - 350: 7 units CBG 351 - 400: 9 units CBG > 400: call MD    oxycodone (OXY-IR) 5 MG capsule Take 2 capsules (10 mg total) by mouth every 6 (six) hours as needed (moderate to severe pain). Qty: 30 capsule, Refills: 0    UNABLE TO FIND Cyle Clinimix 5/15 (WITH NO lytes - likely needs alternating lytes and no lytes) over 12 hours + lipids 20% at 20 ml/hr over 12 hours - provides 95gm protein/day (95% goal) and 1829 Kcal/day (100% goal) . Daily MVI and trace elements in TPN Qty: 20000 L, Refills: 0      CONTINUE these medications which have NOT CHANGED   Details  cyanocobalamin 2000 MCG tablet Take 1 tablet (2,000 mcg total) by mouth daily. Qty: 90 tablet, Refills: 3   Associated Diagnoses: Folate-deficiency anemia; Acute blood loss anemia    diphenhydrAMINE (BENADRYL) 25 mg capsule Take 25 mg by mouth every 4 (four) hours as needed for itching.    FeAsp-B12-FA-C-DSS-SuccAc-Zn (FERIVA 21/7) 75-1 MG TABS Take 1 tablet by mouth daily. Qty: 28 tablet, Refills: 11   Associated Diagnoses: Anemia, iron deficiency    octreotide (SANDOSTATIN) 100 MCG/ML SOLN injection Inject 1 mL (100 mcg total) into the skin every 12 (twelve) hours. Qty: 60 mL, Refills: 0    pantoprazole (PROTONIX) 40 MG tablet Take 1 tablet (40 mg total) by mouth daily. Qty: 30 tablet, Refills: 2    VOLTAREN 1 % GEL Apply 2 g topically 4 (four) times daily as needed (pain).     methocarbamol (ROBAXIN) 500 MG tablet Take 1 tablet (500 mg total) by mouth every 8 (eight) hours as needed for muscle spasms. Qty: 40 tablet, Refills: 0    NITROSTAT 0.4 MG SL tablet DISSOLVE ONE TABLET UNDER THE TONGUE EVERY 5 MINUTES AS NEEDED FOR CHEST PAIN.  DO NOT EXCEED A TOTAL OF 3 DOSES IN 15 MINUTES Qty: 100 tablet, Refills: 0      STOP taking these medications     colchicine 0.6 MG tablet      indomethacin (INDOCIN) 50 MG capsule      nystatin (MYCOSTATIN) 100000 UNIT/ML suspension      traMADol (ULTRAM) 50 MG  tablet         ALLERGIES:  No Known Allergies  BRIEF HPI:  See H&P, Labs, Consult and Test reports for all details in brief, Patient is a 74 year old female with known history of enterocutaneous fistula-who was recently admitted on 9/22 for increased ostomy output with encephalopathy. She was treated with TNA/Sandostatin-a eakin pouch was placed, she was subsequently discharged to SNF on 10/4. Patient was readmitted on 10/31, after increased biliary drainage from her  fistula along with biliary emesis. Patient was found to have acute on chronic kidney disease, coagulase negative Staphylococcus bacteremia.  CONSULTATIONS:   nephrology, general surgery and Palliative care  PERTINENT RADIOLOGIC STUDIES: Ct Abdomen Pelvis Wo Contrast  07/13/2015  CLINICAL DATA:  Subsequent encounter for nausea vomiting with low abdominal pain and increased drainage from fistula. EXAM: CT ABDOMEN AND PELVIS WITHOUT CONTRAST TECHNIQUE: Multidetector CT imaging of the abdomen and pelvis was performed following the standard protocol without IV contrast. COMPARISON:  06/04/2015. FINDINGS: Lower chest: Motion artifact with some atelectasis in the dependent bases. Hepatobiliary: No focal abnormality in the liver on this study without intravenous contrast. No evidence of hepatomegaly. Gallbladder is surgically absent. No intrahepatic or extrahepatic biliary dilation. Pancreas: No focal mass lesion. No dilatation of the main duct. No intraparenchymal cyst. No peripancreatic edema. Spleen: No splenomegaly. No focal mass lesion. Adrenals/Urinary Tract: No adrenal nodule or mass. No mass lesion evident in either kidney on this study without intravenous contrast material. No evidence for hydroureteronephrosis. The urinary bladder appears normal for the degree of distention. Stomach/Bowel: Small hiatal hernia noted. Metallic foreign body identified along the posterior wall of the gastric fundus. This is been present on multiple prior  studies is well. Duodenum is normally positioned as is the ligament of Treitz. No small bowel wall thickening. No small bowel dilatation. The terminal ileum is normal. The appendix is not visualized, but there is no edema or inflammation in the region of the cecum. Diverticuli are seen scattered along the entire length of the colon without CT findings of diverticulitis. Small bowel adhesions to the anterior abdominal wall again noted. There is soft tissue attenuation in the midline anterior abdominal wall, at the site of previous surgical wound. This is the area where enterocutaneous fistula was visualized on the previous study. No gas in this region today but no oral contrast was administered to allow assessment of the fistula. Vascular/Lymphatic: There is abdominal aortic atherosclerosis without aneurysm. There is no gastrohepatic or hepatoduodenal ligament lymphadenopathy. No intraperitoneal or retroperitoneal lymphadenopy. No pelvic sidewall lymphadenopathy. Reproductive: Uterus is surgically absent. There is no adnexal mass. Other: No intraperitoneal free fluid. Musculoskeletal: Bone windows reveal no worrisome lytic or sclerotic osseous lesions. IMPRESSION: Stable exam. No new or acute interval findings. There is no gas in the region of the chronic enterocutaneous fistula no evidence to suggest interval development of an abscess in this region. Fistula not well assessed given the lack of oral contrast on today's study. No evidence for bowel obstruction. No intraperitoneal fluid collection and no intraperitoneal free fluid. Electronically Signed   By: Misty Stanley M.D.   On: 07/13/2015 13:27   Dg Chest 2 View  07/13/2015  CLINICAL DATA:  Emesis.  CHF. EXAM: CHEST  2 VIEW COMPARISON:  06/04/2015 chest radiograph FINDINGS: Right PICC terminates in the lower third of the superior vena cava. Stable cardiomediastinal silhouette with mild cardiomegaly. No pneumothorax. No pleural effusion. Clear lungs, with no  focal lung consolidation and no pulmonary edema. IMPRESSION: Stable mild cardiomegaly without pulmonary edema. Lungs appear clear. Electronically Signed   By: Ilona Sorrel M.D.   On: 07/13/2015 12:43   US Abdomen Complete  07/21/2015  CLINICAL DATA:  Elevated liver function studies. EXAM: ULTRASOUND ABDOMEN COMPLETE COMPARISON:  Multiple prior ultrasound and CT examinations. FINDINGS: Gallbladder: Surgically absent Common bile duct: Diameter: 7.1 mm Liver: Normal echogenicity without focal lesion or biliary dilatation. IVC: Normal caliber Pancreas: Poorly visualized but normal on recent CT scans. Spleen: Normal size.  No focal lesions. Right Kidney: Length: 10.4 cm. Mild renal cortical thinning but no mass or hydronephrosis. Left Kidney: Length: 10.3 cm. Mild renal cortical thinning but no mass or hydronephrosis. Abdominal aorta: Normal caliber Other findings: None. IMPRESSION: Status post cholecystectomy with mild associated common bile duct dilatation. Normal sonographic appearance of the liver. No intrahepatic biliary dilatation. Poor visualization of the pancreas but it was normal on recent CT scans. Electronically Signed   By: Marijo Sanes M.D.   On: 07/21/2015 23:43   US Renal  07/17/2015  CLINICAL DATA:  Renal insufficiency EXAM: RENAL ULTRASOUND COMPARISON:  Renal ultrasound June 08, 2015; CT abdomen and pelvis July 13, 2015 FINDINGS: Right Kidney: Length: 11.1 cm. Echogenicity and renal cortical thickness are within normal limits. No perinephric fluid or hydronephrosis visualized. There is a cyst in the upper pole region measuring 1.5 x 1.5 x 1.5 cm. No sonographically demonstrable calculus or ureterectasis. Left Kidney: Length: 10.8 cm. Echogenicity and renal cortical thickness are within normal limits. No mass, perinephric fluid, or hydronephrosis visualized. No sonographically demonstrable calculus. Bladder: Completely empty and cannot be assessed. IMPRESSION: Cyst arising from upper pole  right kidney. Study otherwise unremarkable. Note the urinary bladder is empty and cannot be assessed. Electronically Signed   By: Lowella Grip III M.D.   On: 07/17/2015 11:45   Ir Fluoro Guide Cv Line Right  07/27/2015  INDICATION: 74 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 Patient also has chronic kidney disease. Patient needs IV access for TPN and antibiotics. EXAM: FLUOROSCOPIC AND ULTRASOUND GUIDED PLACEMENT OF A TUNNELED CENTRAL VENOUS CATHETER Physician: Stephan Minister. Henn, MD FLUOROSCOPY TIME:  54 seconds, 23 mGy MEDICATIONS: 1 mg Versed, 25 mcg fentanyl. A radiology nurse monitored the patient for moderate sedation. ANESTHESIA/SEDATION: Moderate sedation time: 20 minutes PROCEDURE: Informed consent was obtained for placement of a tunneled central venous catheter. The patient was placed supine on the interventional table. Ultrasound confirmed a patent right internal jugularvein. Ultrasound images were obtained for documentation. The right side of the neck was prepped and draped in a sterile fashion. The right side of the neck was anesthetized with 1% lidocaine. Maximal barrier sterile technique was utilized including caps, mask, sterile gowns, sterile gloves, sterile drape, hand hygiene and skin antiseptic. A small incision was made with #11 blade scalpel. A 21 gauge needle directed into the right internal jugular vein with ultrasound guidance. A micropuncture dilator set was placed. A dual lumen Powerline catheter was selected. The skin below the right clavicle was anesthetized and a small incision was made with an #11 blade scalpel. A subcutaneous tunnel was formed to the vein dermatotomy site. The catheter was brought through the tunnel and cut to 22 cm. The vein dermatotomy site was dilated to accommodate a peel-away sheath. The catheter was placed through the peel-away sheath and directed into the central venous structures. The tip of the catheter was placed at the superior cavoatrial junction with  fluoroscopy. Fluoroscopic images were obtained for documentation. Both lumens were found to aspirate and flush well. The vein dermatotomy site was closed using a single layer of absorbable suture and Dermabond. The catheter was secured to the skin using Prolene suture. FINDINGS: Catheter tip at the superior cavoatrial junction. Estimated blood loss: Minimal COMPLICATIONS: None IMPRESSION: Successful placement of a right jugular tunneled central venous catheter using ultrasound and fluoroscopic guidance. Electronically Signed   By: Markus Daft M.D.   On: 07/27/2015 17:40   Ir US Guide Vasc Access Right  07/27/2015  INDICATION: 74 year old with enterocutaneous  fistula. Patient also has chronic kidney disease. Patient needs IV access for TPN and antibiotics. EXAM: FLUOROSCOPIC AND ULTRASOUND GUIDED PLACEMENT OF A TUNNELED CENTRAL VENOUS CATHETER Physician: Stephan Minister. Henn, MD FLUOROSCOPY TIME:  54 seconds, 23 mGy MEDICATIONS: 1 mg Versed, 25 mcg fentanyl. A radiology nurse monitored the patient for moderate sedation. ANESTHESIA/SEDATION: Moderate sedation time: 20 minutes PROCEDURE: Informed consent was obtained for placement of a tunneled central venous catheter. The patient was placed supine on the interventional table. Ultrasound confirmed a patent right internal jugularvein. Ultrasound images were obtained for documentation. The right side of the neck was prepped and draped in a sterile fashion. The right side of the neck was anesthetized with 1% lidocaine. Maximal barrier sterile technique was utilized including caps, mask, sterile gowns, sterile gloves, sterile drape, hand hygiene and skin antiseptic. A small incision was made with #11 blade scalpel. A 21 gauge needle directed into the right internal jugular vein with ultrasound guidance. A micropuncture dilator set was placed. A dual lumen Powerline catheter was selected. The skin below the right clavicle was anesthetized and a small incision was made with an  #11 blade scalpel. A subcutaneous tunnel was formed to the vein dermatotomy site. The catheter was brought through the tunnel and cut to 22 cm. The vein dermatotomy site was dilated to accommodate a peel-away sheath. The catheter was placed through the peel-away sheath and directed into the central venous structures. The tip of the catheter was placed at the superior cavoatrial junction with fluoroscopy. Fluoroscopic images were obtained for documentation. Both lumens were found to aspirate and flush well. The vein dermatotomy site was closed using a single layer of absorbable suture and Dermabond. The catheter was secured to the skin using Prolene suture. FINDINGS: Catheter tip at the superior cavoatrial junction. Estimated blood loss: Minimal COMPLICATIONS: None IMPRESSION: Successful placement of a right jugular tunneled central venous catheter using ultrasound and fluoroscopic guidance. Electronically Signed   By: Markus Daft M.D.   On: 07/27/2015 17:40   Dg Chest Port 1 View  07/25/2015  CLINICAL DATA:  PICC line placement EXAM: PORTABLE CHEST 1 VIEW COMPARISON:  07/13/2015 FINDINGS: Left PICC line tip is at the innominate SVC junction. This could be advanced 5 cm to the SVC RA junction. Normal heart size and vascularity. Minor streaky left base atelectasis. Right lung remains clear. No edema, effusion, or pneumothorax. Trachea midline. Atherosclerosis of the aorta. IMPRESSION: Left PICC line tip at the innominate SVC junction and can be advanced 5 cm to the SVC RA junction. Left base atelectasis Thoracic atherosclerosis Electronically Signed   By: Jerilynn Mages.  Shick M.D.   On: 07/25/2015 12:07   Dg Ugi W/small Bowel  07/20/2015  CLINICAL DATA:  74 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 Prior repair. Question level of fistula. Subsequent encounter. EXAM: UPPER GI SERIES WITH SMALL BOWEL FOLLOW-THROUGH FLUOROSCOPY TIME:  Radiation Exposure Index (as provided by the fluoroscopic device): 1634.12 micro  Gy TECHNIQUE: Water-soluble upper GI series followed by small bowel were obtained after discussion with Dr. Kieth Brightly. COMPARISON:  None. FINDINGS: Draining clips are noted on scout view. Patient was only able to ingest a small amount of Omnipaque 300 at one time. Total of 150 cc was ingested. The present examination was specifically tailored to evaluate for level of the enterocutaneous fistula. Gastroesophageal reflux noted. Patient was kept in the upright position to avoid aspiration. Scattered duodenal diverticulum. Nonspecific eggshell calcification right upper quadrant. Enterocutaneous fistulas best seen on lateral overhead imaging. This appears to arise  from the proximal to mid ileum. Distal aspect of the small bowel not visualized. Patient was tired of the exam at this point. Study terminated. IMPRESSION: Enterocutaneous fistulas best seen on lateral overhead imaging. This appears to arise from the proximal to mid ileum. Exam otherwise limited as noted above. Electronically Signed   By: Genia Del M.D.   On: 07/20/2015 15:46     PERTINENT LAB RESULTS: CBC:  Recent Labs  08/02/15 0742  WBC 6.0  HGB 8.6*  HCT 28.0*  PLT 251   CMET CMP     Component Value Date/Time   NA 132* 08/04/2015 0545   K 3.5 08/04/2015 0545   CL 100* 08/04/2015 0545   CO2 23 08/04/2015 0545   GLUCOSE 171* 08/04/2015 0545   BUN 109* 08/04/2015 0545   CREATININE 3.62* 08/04/2015 0545   CREATININE 1.64* 06/27/2014 1647   CALCIUM 8.3* 08/04/2015 0545   PROT 6.0* 08/03/2015 0640   ALBUMIN 2.0* 08/03/2015 0640   AST 17 08/03/2015 0640   ALT 16 08/03/2015 0640   ALKPHOS 80 08/03/2015 0640   BILITOT 0.3 08/03/2015 0640   GFRNONAA 11* 08/04/2015 0545   GFRAA 13* 08/04/2015 0545    GFR Estimated Creatinine Clearance: 16 mL/min (by C-G formula based on Cr of 3.62). No results for input(s): LIPASE, AMYLASE in the last 72 hours. No results for input(s): CKTOTAL, CKMB, CKMBINDEX, TROPONINI in the last 72  hours. Invalid input(s): POCBNP No results for input(s): DDIMER in the last 72 hours. No results for input(s): HGBA1C in the last 72 hours.  Recent Labs  08/03/15 0640  TRIG 165*   No results for input(s): TSH, T4TOTAL, T3FREE, THYROIDAB in the last 72 hours.  Invalid input(s): FREET3 No results for input(s): VITAMINB12, FOLATE, FERRITIN, TIBC, IRON, RETICCTPCT in the last 72 hours. Coags: No results for input(s): INR in the last 72 hours.  Invalid input(s): PT Microbiology: No results found for this or any previous visit (from the past 240 hour(s)).   BRIEF HOSPITAL COURSE: Brief narrative: Patient is a 74 year old female with known history of enterocutaneous fistula-who was recently admitted on 9/22 for increased ostomy output with encephalopathy. She was treated with TNA/Sandostatin-a eakin pouch was placed, she was subsequently discharged to SNF on 10/4. Patient was readmitted on 10/31, after increased biliary drainage from her fistula along with biliary emesis. Patient was found to have acute on chronic kidney disease, coagulase negative Staphylococcus bacteremia. General surgery/infectious disease has been following patient. Her prior PICC line has been discontinued, and a Port-A-Cath has been placed. Unfortunately, hospital course has been complicated by redevelopment of acute renal failure, along with severe failure to thrive syndrome. Plans are for definite surgical repair of enterocutaneous fistula once patient is nutritionally and physically ready. Currently remains NPO and on TNA-seen by Palliative care-after discussion with family- plans are for SNF on discharge.  Hospital Course by Problem List: Acute on chronic renal failure stage III: Felt to have acute renal failure secondary to prerenal azotemia from dehydration/high ostomy output which initially improved, but recurred-thought to be secondary to vancomycin. Creatinine seems to have plateaued around 3.5-3.6 range, although  she is slightly volume overloaded-this is felt to be secondary to hypoalbuminemia/third spacing rather than acute CHF. Nephrology followed patient during this hospital course, for now we will hold off on resuming Lasix on discharge.Please check chemistry panel in the next few days, and consider outpatient referral to nephrology. Please note, per nephrology patient not a candidate for long-term hemodialysis he did palliative  care consulted, family hopeful of stabilization of renal function.  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 Continue Sandostatin and loperamide while at SNF. Continue NPO status (except meds)-will be on TNA per SNF protocol including appropriate labs while NPO. Please advance diet ONLY after instructed by General surgery. Per general surgery-currently a poor surgical candidate for repair of enterocutaneous fistula-needs to be optimized both nutritionally and physically before proceeding with surgery.Unfortunately given worsening renal function, major failure to thrive syndrome-may not be a surgical candidate in the near future.In case patient continues to exhibit worsening failure to thrive syndrome and worsening renal failure-will need involvement of palliative care at SNF. Please note-seen by palliative care while hospitalized-although DO NOT RESUSCITATE-patient/family desires aggressive care and are hopeful of ultimate recovery.   Sepsis secondary to coagulase negative Staphylococcus bacteremia: Likely catheter-related bacteremia-likely present on admission.PICC line has been discontinued.Port placed by IR on 11/14. Although recommendations were to stop vancomycin on 11/20-given worsening renal function-vancomycin was discontinued on 11/15. Dr. Tana Coast spoke with infectious disease M.D.-Dr Linus Salmons who recommended to observe off  antibiotics. Remains afebrile and without leukocytosis. Note blood cultures on 11/7 and 11/8 negative so far.   Anemia: Secondary to acute on chronic illness/CKD- transfuse 2 units of PRBC on 11/15-follow hemoglobin. No overt evidence of any blood loss. monitor closely in the outpatient setting   Proteus KXF:[id number], completed 5 days of empiric Rocephin.  Transaminitis: Resolved. Likely secondary to sepsis/shock liver. History of cholecystectomy. Abdominal ultrasound on 11/8 did not show any major intrahepatic/extrahepatic findings.  Acute encephalopathy: Likely secondary to sepsis-resolved. Although very deconditioned/weak-is awake and alert.  Acute on Chronic diastolic heart failure: much more compensated-suspect edema mostly from hypoalbuminemia/third spacing. Briefly treated with IV Lasix. Creatinine seems to have plateaued around 3.5-3.6 range-as noted above-we will observe off diuretics for now. Please monitor volume status closely in the outpatient setting and while at SNF.   Oral thrush:treated with nystatin  Hyponatremia: mild hyponatremia persists-stable for outpatient follow up-on TNA  Severe malnutrition with failure to thrive/Deconditioning:on TPN-PT/OT following. Patient remains very frustrated about her overall health and seems to lack motivation for ambulation etc. Palliative care team met with family on 11/18-plans are to continue with current measures-family is helpful of eventual recovery. Family very much aware that if no recoveryover the next few weeks-then will likely require transitioning to hospice care. Please ensure palliative care follow-up at SNF   OSA (obstructive sleep apnea): continue CPAP qhs at SNF  Depression: Continue Lexapro  TODAY-DAY OF DISCHARGE:  Subjective:   Marissa [name] today remains stable-no major events overnight-she appears very weak and deconditioned.   Objective:   Blood pressure 117/44, pulse 93, temperature 98.3 F (36.8 C),  temperature source Oral, resp. rate 17, height $RemoveBe'5\' 4"'GiAzBEEsj$  (1.626 m), weight 103.6 kg (228 lb 6.3 oz), SpO2 100 %.  Intake/Output Summary (Last 24 hours) at 08/04/15 1050 Last data filed at 08/03/15 1500  Gross per 24 hour  Intake      0 ml  Output      0 ml  Net      0 ml   Filed Weights   08/01/15 0439 08/02/15 0907 08/04/15 0419  Weight: 105.8 kg (233 lb 4 oz) 104.9 kg (231 lb 4.2 oz) 103.6 kg (228 lb 6.3 oz)    Exam Awake Alert, No new F.N deficits, Normal affect [place].AT,PERRAL Supple Neck,No JVD, No cervical lymphadenopathy appriciated.  Symmetrical Chest wall movement, Good air movement bilaterally, CTAB RRR,No Gallops,Rubs or new Murmurs, No Parasternal Heave +ve B.Sounds, Abd Soft, Non tender, No organomegaly appriciated, No rebound -guarding or rigidity. No Cyanosis, Clubbing or edema, No new Rash or bruise  DISCHARGE CONDITION: Stable  DISPOSITION: SNF  DISCHARGE INSTRUCTIONS:    Activity:  As tolerated with Full fall precautions use walker/cane & assistance as needed  Get Medicines reviewed and adjusted: Please take all your medications with you for your next visit with your Primary MD  Please request your Primary MD to go over all hospital tests and procedure/radiological results at the follow up, please ask your Primary MD to get all Hospital records sent to his/her office.  If you experience worsening of your admission symptoms, develop shortness of breath, life threatening emergency, suicidal or homicidal thoughts you must seek medical attention immediately by calling 911 or calling your MD immediately  if symptoms less severe.  You must read complete instructions/literature along with all the possible adverse reactions/side effects for all the Medicines you take and that have been prescribed to you. Take any new Medicines after you have completely understood and accpet all the possible adverse reactions/side effects.   Do not drive when taking Pain medications.    Do not take more than prescribed Pain, Sleep and Anxiety Medications  Special Instructions: If you have smoked or chewed Tobacco  in the last 2 yrs please stop smoking, stop any regular Alcohol  and or any Recreational drug use.  Wear Seat belts while driving.  Please note  You were cared for by a hospitalist during your hospital stay. Once you are discharged, your primary care physician will handle any further medical issues. Please note that NO REFILLS for any discharge medications will be authorized once you are discharged, as it is imperative that you return to your primary care physician (or establish a relationship with a primary care physician if you do not have one) for your aftercare needs so that they can reassess your need for medications and monitor your lab values.   Diet recommendation: NPO except meds  Discharge Instructions    Call MD for:  temperature >100.4    Complete by:  As directed      Call MD for:    Complete by:  As directed   If leakage around the fistula site, if significant output from the fistula     Discharge wound care:    Complete by:  As directed   Periwound:  Continue stoma powder dusting to protect exposed skin and promote healing. Dressing procedure/placement/frequency: Apply skin prep to protect skin, then apply small Eakin pouch with red rubber catheter to low wall suction to control drainage and promote healing. Please consult wound nurse or Dr Georgette Dover know  if leakage occurs  Leave abdominal Eakin pouch to low continuous wall suction at all times. Monitor status of catheter in the pouch to make sure the catheter does not lie in the wound bed.   DO not throw catheter away if gets clogged, please irrigate with sterile water to clear.  BEDSIDE NURSE: please change pouch if leaking as follows: trace pattern left at bedside onto a new pouch, cut out, and apply after skin has been cleaned and dried.  Cut a small hole in the top of the pouch and insert  the red rubber tube to low wall suction, and seal the opening with pink tape.  Dust ostomy powder over raw locations on exposed areas of abd  PRN when turning or cleaning patient     Increase activity slowly    Complete by:  As directed            Follow-up Information    Follow up with [name], MD. Schedule an appointment as soon as possible for a visit in 2 weeks.   Specialty:  Internal Medicine   Contact information:   [address] [address] [phone]       Follow up with Maia Petties., MD. Schedule an appointment as soon as possible for a visit in 2 weeks.   Specialty:  General Surgery   Contact information:   [address] [place] [address] [phone]      Total Time spent on discharge equals 45 minutes.  SignedOren Binet 08/04/2015 10:50 AM

## 2015-08-04 NOTE — Progress Notes (Signed)
Report called to [facility] at [facility].

## 2015-08-04 NOTE — Clinical Social Work Note (Signed)
Patient to return to [facility] at time of discharge.  Patient's daughter updated regarding discharge. EMS transportation arranged for 4:30pm due to TPN arriving at facility at [facility] provided RN with [facility] report number.  Lubertha Sayres, [facility]: [phone] Surgical: [phone]

## 2015-08-04 NOTE — Progress Notes (Signed)
PARENTERAL NUTRITION CONSULT NOTE - FOLLOW UP  Pharmacy Consult for TPN Indication: ECF, TPN dependent  No Known Allergies  Patient Measurements: Height: 5\' 4"  (162.6 cm) Weight: 228 lb 6.3 oz (103.6 kg) IBW/kg (Calculated) : 54.7 Adjusted Body Weight:  Usual Weight:   Vital Signs: Temp: 98.3 F (36.8 C) (11/22 0419) Temp Source: Oral (11/22 0419) BP: 117/44 mmHg (11/22 0419) Pulse Rate: 93 (11/22 0419) Intake/Output from previous day:   Intake/Output from this shift:    Labs:  Recent Labs  08/02/15 0742  WBC 6.0  HGB 8.6*  HCT 28.0*  PLT 251     Recent Labs  08/02/15 0742 08/03/15 0640 08/04/15 0545  NA 133* 130* 132*  K 3.3* 4.2 3.5  CL 97* 98* 100*  CO2 28 23 23   GLUCOSE 167* 158* 171*  BUN 80* 99* 109*  CREATININE 3.57* 3.68* 3.62*  CALCIUM 8.1* 8.4* 8.3*  MG 1.7 1.8  --   PHOS 4.0 4.5  --   PROT  --  6.0*  --   ALBUMIN  --  2.0*  --   AST  --  17  --   ALT  --  16  --   ALKPHOS  --  80  --   BILITOT  --  0.3  --   PREALBUMIN  --  12.5*  --   TRIG  --  165*  --    Estimated Creatinine Clearance: 16 mL/min (by C-G formula based on Cr of 3.62).    Recent Labs  08/04/15 0015 08/04/15 0430 08/04/15 0734  GLUCAP 186* 216* 108*    Medications:  Scheduled:  . acetaminophen  500 mg Oral 3 times per day  . escitalopram  10 mg Oral Daily  . heparin subcutaneous  5,000 Units Subcutaneous 3 times per day  . insulin aspart  0-9 Units Subcutaneous 6 times per day  . loperamide  2 mg Oral 4 times per day  . modafinil  50 mg Oral Daily  . nystatin  5 mL Oral QID  . nystatin   Topical BID  . octreotide  100 mcg Subcutaneous Q12H  . pantoprazole (PROTONIX) IV  40 mg Intravenous Q24H    Insulin Requirements in the past 12 hours:  8 units Sens SSI, no insulin in TPN  Nutritional Goals: per RD note 11/15 1700-1900 kCal, 100-110 grams of protein per day  Current Nutrition:  NPO Clinimix 5/15 + lipids 20% cycled over 18 hours  Nutrition  from PTA:  Per discussions/notes from [facility], was receiving cycled Clinimix 5/15 alternated with E 5/15, 2059ml over 12 hours (2000-0800). Per RN from facility [facility], patient was receiving TPN during her entire stay, however patient's admission labs/dehydrated state as well as report from daughter make this questionable.  Assessment: 74 YOF on chronic TPN for a fistula who was admitted from [facility] with lethargy, 2 days of vomiting, increased drainage from fistula and severe dehydration. Her EC output was bilious and Eakin's pouch was removed at facility. CT scan negative for infection however blood cultures from 10/31 were 2/2 CoNS. TPN held for line holiday and PICC was removed. Repeat blood cultures have been clear - and a new PICC was placed 11/10 to resume TPN. PICC removed 11/12 due to VTE above and below PICC line. TPN stopped 11/12. Tunneled catheter placed by IR 11/14. Resumed TPN 11/5.   Surgeries/Procedures: 04/28/15: wound exploration, explanation of abdominal mesh Multiple other abdominal surgeries  GI: 195ml output yesterday. Continues  on loperamide, octreotide, PPI IV.  Endo: A1C this admission is 6.2 (up from 5.7 in July 2016) - CBGs 180-216 on tpn and 94-106 off tpn. This represents an increase in cbgs on tpn, which have generally been <= 180. Lytes:K 3.5 ( no lytes last PM), Mg & Phos wnl, Na 132, CoCa 10 - needs alternating lytes vs no lytes  Renal:  AoCKD III d/t dehydration - BUN 100/SCr 3.62- overall upward trend, not a HD candidate per renal. IVF stopped 11/15, UOP x 2 yesterday. Hepatobil: LFTs WNL, TG 165, PAlb 125- sl upward trend Pulm: 98% RA Cards: BP soft, HR wnl, EF 55% ID: AFeb, WBC wnl (11/20). Completed antibiotics, continued on nystatin for thrush Best Practices Hep SQ  TPN Access: PICC placed 11/10 for TPN removed 11/12 11/14 tunneled catheter placed TPN start date: chronic TPN- was on at home prior to admission in Sept,  and continued after discharge. resumed 11/1 at the hospital, off 11/4, resumed 11/10, off 11/12, resumed 11/15  Plan: - [place] Clinimix 5/15-E over 12 hours + lipids 20% at 20 ml/hr over 12 hours - provides 95gm protein/day (95% goal) and 1829 Kcal/day (100% goal)  - Daily MVI and trace elements in TPN - Change SSI to twice on tpn and twice off tpn - Watch CBGs, may need to add insulin. - F/u AM labs - Poss d/c to SNF today, but RN unaware of plan at this point. Will f/u.  Gracy Bruins, PharmD Clinical Pharmacist [facility]

## 2015-08-04 NOTE — Consult Note (Signed)
WOC wound follow up Current Eakin pouch changed. Wound type: Full thickness wound to midline abd with fistula. Wound beefy red. Upper abd has 3 areas of full thickness wounds which are beefy red and almost connected. Applied piece of Eakin over upper abd wounds to protect. Drainage (amount, consistency, odor) Small amt green drainage in cannister Periwound: Intact skin surrounding wound is less moist and beginning to dry and peel. Continue stoma powder dusting to protect exposed skin and promote healing. Dressing procedure/placement/frequency: Applied skin prep to protect skin, then applied small Eakin pouch with red rubber catheter to low wall suction to control drainage and promote healing. If output continues to decrease, then suction can be removed. Supplies at bedside and instructions provided for staff nurses if leakage occurs. Julien Girt MSN, RN, CWOCN, CWCN-AP, CNS

## 2015-08-04 NOTE — Progress Notes (Signed)
Occupational Therapy Treatment Patient Details Name: Marissa [name] MRN: [id number] DOB: [birth year] Today's Date: 08/04/2015    History of present illness 74 yo female admitted 10/31 from SNF for dehydration, lethargy, renal failure, UTI and decreased PT intake after TPN stopped.  Pt with significant PMH of CKD, obstructive sleep apnea, DMII, HTN, hidradenitis suppurativa, diastolic heart failure, [id number] abdominal wound explorationof enterocutaneous fistula and absecess.  Pt currently with abdominal pouch/wound connected to wall suction.     OT comments  Pt noted to have very loose stool (strong odor) and tech [employer] states in report provided that patient with frequent loose stool throughout PM hours. Pt could greatly benefit from mattress overlay. Pt with stage 1 break down in peri area and calling out with pain with any hygiene (A). Pt limited to bed level only this session due to loose stool.    Follow Up Recommendations  SNF    Equipment Recommendations  Other (comment) (mattress overlay)    Recommendations for Other Services Other (comment) (palliative)    Precautions / Restrictions Precautions Precautions: Fall Precaution Comments: wound pouch in place mid-stomach region connected to wall suction.        Mobility Bed Mobility Overal bed mobility: Needs Assistance Bed Mobility: Rolling Rolling: Max assist         General bed mobility comments: use of bed rail max (A)   Transfers                      Balance                                   ADL Overall ADL's : Needs assistance/impaired             Lower Body Bathing: Total assistance                         General ADL Comments: on arrival pt states "i need to be cleaned up and it is more than pee" Pt aware but not calling RN staff. pt log rolled max (A) with bed rail use to R and L for peri care. pt noted to have strong odor to stool. notified RN and questioned recent cdiff  test. pt with stage 1 break down throughout inner thighs and peri area with barrier cream applied. Pt could greatly benefit from mattress overlay      Vision                     Perception     Praxis      Cognition   Behavior During Therapy: [facility] for tasks assessed/performed Overall Cognitive Status: No family/caregiver present to determine baseline cognitive functioning                       Extremity/Trunk Assessment               Exercises     Shoulder Instructions       General Comments      Pertinent Vitals/ Pain       Pain Assessment: Faces Faces Pain Scale: Hurts worst Pain Location: peri area due to hygiene Pain Descriptors / Indicators: Grimacing Pain Intervention(s): Repositioned;Monitored during session  Home Living Family/patient expects to be discharged to:: Skilled nursing facility  Prior Functioning/Environment Level of Independence: Needs assistance            Frequency Min 2X/week     Progress Toward Goals  OT Goals(current goals can now be found in the care plan section)  Progress towards OT goals: Not progressing toward goals - comment  Acute Rehab OT Goals Patient Stated Goal: none stated OT Goal Formulation: With patient Time For Goal Achievement: 08/18/15 Potential to Achieve Goals: Fair ADL Goals Pt Will Perform Grooming: sitting;with set-up Pt Will Transfer to Toilet: with +2 assist;with total assist;squat pivot transfer;bedside commode Pt Will Perform Toileting - Clothing Manipulation and hygiene: with mod assist;sitting/lateral leans Pt/caregiver will Perform Home Exercise Program: Increased strength;Both right and left upper extremity;With theraband;With Supervision Additional ADL Goal #1: Pt will be mod A for bed mobility for precursor to ADL participation  Plan Discharge plan needs to be updated    Co-evaluation                 End of  Session     Activity Tolerance Patient limited by pain   Patient Left in bed;with call bell/phone within reach;with nursing/sitter in room   Nurse Communication Other (comment) (odor to stool and skin breakdown)        Time: [id number] OT Time Calculation (min): 18 min  Charges: OT General Charges $OT Visit: 1 Procedure OT Treatments $Self Care/Home Management : 8-22 mins  Parke Poisson B 08/04/2015, 10:12 AM   Jeri Modena   OTR/L Pager: [phone] Office: [phone] .

## 2015-08-04 NOTE — Progress Notes (Signed)
  Subjective: Major complaint is itching, no other real change, she says she cannot get OOB it hurts to much.  Objective: Vital signs in last 24 hours: Temp:  [98.3 F (36.8 C)-98.7 F (37.1 C)] 98.3 F (36.8 C) (11/22 0419) Pulse Rate:  [87-93] 93 (11/22 0419) Resp:  [17-18] 17 (11/22 0419) BP: (97-117)/(44-49) 117/44 mmHg (11/22 0419) SpO2:  [100 %] 100 % (11/22 0419) Weight:  [103.6 kg (228 lb 6.3 oz)] 103.6 kg (228 lb 6.3 oz) (11/22 0419) Last BM Date: 08/04/15 1 BM only recorded info in I/O Afebrile, VSS Creatinine 3.62 Intake/Output from previous day:   Intake/Output this shift:    General appearance: alert, cooperative and no distress GI: soft, not overly tender, wounds and eakins are the same.  Lab Results:   Recent Labs  08/02/15 0742  WBC 6.0  HGB 8.6*  HCT 28.0*  PLT 251    BMET  Recent Labs  08/03/15 0640 08/04/15 0545  NA 130* 132*  K 4.2 3.5  CL 98* 100*  CO2 23 23  GLUCOSE 158* 171*  BUN 99* 109*  CREATININE 3.68* 3.62*  CALCIUM 8.4* 8.3*   PT/INR No results for input(s): LABPROT, INR in the last 72 hours.   Recent Labs Lab 07/29/15 0450 07/30/15 0603 08/03/15 0640  AST 16 17 17   ALT 29 25 16   ALKPHOS 80 83 80  BILITOT 0.7 0.4 0.3  PROT 5.1* 5.6* 6.0*  ALBUMIN 1.8* 1.9* 2.0*     Lipase     Component Value Date/Time   LIPASE 27 12/26/2014 1815     Studies/Results: No results found.  Medications: . acetaminophen  500 mg Oral 3 times per day  . escitalopram  10 mg Oral Daily  . heparin subcutaneous  5,000 Units Subcutaneous 3 times per day  . insulin aspart  0-9 Units Subcutaneous 6 times per day  . loperamide  2 mg Oral 4 times per day  . modafinil  50 mg Oral Daily  . nystatin  5 mL Oral QID  . nystatin   Topical BID  . octreotide  100 mcg Subcutaneous Q12H  . pantoprazole (PROTONIX) IV  40 mg Intravenous Q24H    Assessment/Plan Enterocutaneous fistula with Eakin's pouch Acute on chronic renal failure Sepsis  with coagulase negative Staphylococcus bacteremia Proteus UTI Anemia Acute encephalopathy, most likely secondary to sepsis Acute on chronic DCHF Malnutrition on TNA Deconditioning OSA Depression DNR Antibiotics: None DVT: Heparin/SCD   Plan:  For SNF at this point.    LOS: 22 days    Augustine Brannick 08/04/2015

## 2015-08-05 ENCOUNTER — Telehealth: Payer: Self-pay | Admitting: *Deleted

## 2015-08-05 NOTE — Telephone Encounter (Signed)
Pt was on TCM list admitted for acute renal failure. Pt d/c 11/22, and sent to SNF w/hospice pallative care consult,,,/lmb

## 2015-08-23 ENCOUNTER — Encounter (HOSPITAL_COMMUNITY): Payer: Self-pay

## 2015-08-23 ENCOUNTER — Emergency Department (HOSPITAL_COMMUNITY): Payer: Medicare Other

## 2015-08-23 ENCOUNTER — Inpatient Hospital Stay (HOSPITAL_COMMUNITY)
Admission: EM | Admit: 2015-08-23 | Discharge: 2015-08-25 | DRG: 314 | Disposition: A | Discharge status: Home / Self Care | Payer: Medicare Other | Attending: Internal Medicine | Admitting: Internal Medicine

## 2015-08-23 DIAGNOSIS — E872 Acidosis, unspecified: Secondary | ICD-10-CM | POA: Diagnosis present

## 2015-08-23 DIAGNOSIS — E8729 Other acidosis: Secondary | ICD-10-CM

## 2015-08-23 DIAGNOSIS — R7881 Bacteremia: Secondary | ICD-10-CM | POA: Insufficient documentation

## 2015-08-23 DIAGNOSIS — I129 Hypertensive chronic kidney disease with stage 1 through stage 4 chronic kidney disease, or unspecified chronic kidney disease: Secondary | ICD-10-CM | POA: Diagnosis present

## 2015-08-23 DIAGNOSIS — I10 Essential (primary) hypertension: Secondary | ICD-10-CM | POA: Diagnosis present

## 2015-08-23 DIAGNOSIS — R740 Nonspecific elevation of levels of transaminase and lactic acid dehydrogenase [LDH]: Secondary | ICD-10-CM | POA: Diagnosis present

## 2015-08-23 DIAGNOSIS — R7989 Other specified abnormal findings of blood chemistry: Secondary | ICD-10-CM

## 2015-08-23 DIAGNOSIS — D649 Anemia, unspecified: Secondary | ICD-10-CM | POA: Diagnosis present

## 2015-08-23 DIAGNOSIS — E1122 Type 2 diabetes mellitus with diabetic chronic kidney disease: Secondary | ICD-10-CM | POA: Diagnosis present

## 2015-08-23 DIAGNOSIS — R748 Abnormal levels of other serum enzymes: Secondary | ICD-10-CM | POA: Diagnosis present

## 2015-08-23 DIAGNOSIS — A419 Sepsis, unspecified organism: Secondary | ICD-10-CM | POA: Diagnosis present

## 2015-08-23 DIAGNOSIS — N390 Urinary tract infection, site not specified: Secondary | ICD-10-CM

## 2015-08-23 DIAGNOSIS — E119 Type 2 diabetes mellitus without complications: Secondary | ICD-10-CM

## 2015-08-23 DIAGNOSIS — D509 Iron deficiency anemia, unspecified: Secondary | ICD-10-CM

## 2015-08-23 DIAGNOSIS — R945 Abnormal results of liver function studies: Secondary | ICD-10-CM

## 2015-08-23 DIAGNOSIS — R778 Other specified abnormalities of plasma proteins: Secondary | ICD-10-CM

## 2015-08-23 DIAGNOSIS — R109 Unspecified abdominal pain: Secondary | ICD-10-CM

## 2015-08-23 DIAGNOSIS — K632 Fistula of intestine: Secondary | ICD-10-CM | POA: Diagnosis present

## 2015-08-23 DIAGNOSIS — N189 Chronic kidney disease, unspecified: Secondary | ICD-10-CM | POA: Diagnosis present

## 2015-08-23 DIAGNOSIS — D72829 Elevated white blood cell count, unspecified: Secondary | ICD-10-CM

## 2015-08-23 DIAGNOSIS — R4182 Altered mental status, unspecified: Secondary | ICD-10-CM

## 2015-08-23 DIAGNOSIS — N179 Acute kidney failure, unspecified: Secondary | ICD-10-CM | POA: Diagnosis present

## 2015-08-23 DIAGNOSIS — Z794 Long term (current) use of insulin: Secondary | ICD-10-CM

## 2015-08-23 DIAGNOSIS — T80212A Local infection due to central venous catheter, initial encounter: Principal | ICD-10-CM | POA: Diagnosis present

## 2015-08-23 DIAGNOSIS — I248 Other forms of acute ischemic heart disease: Secondary | ICD-10-CM | POA: Diagnosis present

## 2015-08-23 DIAGNOSIS — I5032 Chronic diastolic (congestive) heart failure: Secondary | ICD-10-CM | POA: Diagnosis present

## 2015-08-23 DIAGNOSIS — N289 Disorder of kidney and ureter, unspecified: Secondary | ICD-10-CM

## 2015-08-23 NOTE — ED Notes (Signed)
Mersadez Mastandrea)- [phone] Lilly Kimbrell(daughter)- [phone]  Family is leaving, and would like a call when the pt gets admitted.

## 2015-08-23 NOTE — ED Notes (Signed)
Pt arrived via [facility] from [place] with c/o AMS for the last 3-4 days.  Pt has right subclavian PICC, fistula but does not go to dialysis.  Pts family states that she stopped speaking or following commands.

## 2015-08-23 NOTE — ED Provider Notes (Signed)
CSN: [id number]     Arrival date & time 08/23/15  2206 History   First MD Initiated Contact with Patient 08/23/15 2216     Chief Complaint  Patient presents with  . Respiratory Distress     (Consider location/radiation/quality/duration/timing/severity/associated sxs/prior Treatment) HPI Comments: Patient is a 74 year old female with extensive medical history including recent 22 day hospitalization with Enterocutaneous fistula with Eakin's pouch, Acute on chronic renal failure, Sepsis with coagulase negative Staphylococcus bacteremia, Proteus UTI, Anemia, Acute encephalopathy, most likely secondary to sepsis, Acute on chronic CHF,Malnutrition on TPN who presents today with from the nursing home with worsening mental status. Over the last few days patient has had more somnolence and today she became mostly unresponsive with heavy breathing. She complained of mild nausea yesterday but no vomiting. And her she's having regular bowel movements. Her fistula output has improved and she continues to get TPN. No documented fevers at the nursing home. Family denies any known cough or complaint of shortness of breath.   The history is provided by the EMS personnel and a relative.    Past Medical History  Diagnosis Date  . Hypertension   . Hyperlipidemia   . History of DVT (deep vein thrombosis)   . History of colonic polyps   . Gout   . Polyarthritis   . Microcytic anemia   . GERD (gastroesophageal reflux disease)   . Esophagitis   . Hidradenitis suppurativa     s/p axillary sweat gland removal  . H/O blood transfusion reaction     at [facility] hospital  . Diverticulitis   . Difficulty sleeping   . PMB (postmenopausal bleeding)     X 2 YRS  . Bowel trouble     OCCASIONAL BOWEL INCONTINENCE  . Fibroids     s/p TAH/BSO 07/2012  . CAD (coronary artery disease)     a. 8/07 had BMS to OM.  b. In-stent restenosis with later Promus DES to same site.  c. Lexiscan myoview in 1/13 showed EF 67%, no  ischemia or infarction.  d. Echo (2/13) showed EF 55-60%, moderate LVH, mild MR.  e. Lex MV 6/14: no isch, EF 53%;  f. Echo 6/14: mild LVH, EF 55-60%, Gr 1 DD, MAC, mild MR, mild LAE  . Peripheral neuropathy ([place])   . Chronic diastolic CHF (congestive heart failure) ([place]) 06/29/2014  . Renal insufficiency     baseline Cr ~ 1.3  . Chronic kidney disease (CKD), stage III (moderate)     Archie Endo 04/27/2015  . OSA on CPAP     "wear it sometimes" (04/27/2015)  . History of hiatal hernia   . [id number])     "weekly" (04/27/2015)  . Enterocutaneous fistula     "have had it 2-3 months now" 04/27/2015  . Type II diabetes mellitus (HCC)     diet controlled: pt's daughter denies mother's hx of diabetes   Past Surgical History  Procedure Laterality Date  . Anterior cervical decomp/discectomy fusion    . Cervical laminectomy    . Umbilical hernia repair    . Axillary hidradenitis excision Bilateral   . Knee arthroscopy Right   . Colectomy Left 2004    left partial for perforation  . Cholecystectomy    . Tubal ligation    . Coronary angioplasty with stent placement      of mid and distal circumflex coronary artery. 1st stent was 04/2006, with drug eluting stent placed on 12/20/07 (30-50% RCA stenosis). Cardiologist Dr. Einar Gip.  . Dilation and curettage  of uterus  02/2011  . Hysteroscopy w/d&c  05/04/2012    Procedure: DILATATION AND CURETTAGE /HYSTEROSCOPY;  Surgeon: Lahoma Crocker, MD;  Location: [facility];  Service: Gynecology;  Laterality: N/A;  . Robotic assisted total hysterectomy with bilateral salpingo oopherectomy  07/24/2012    Procedure: ROBOTIC ASSISTED TOTAL HYSTERECTOMY WITH BILATERAL SALPINGO OOPHORECTOMY;  Surgeon: Janie Morning, MD PHD;  Location: [facility];  Service: Gynecology;  Laterality: Bilateral;  attempted ,  converted to abdomnial hysterectomy  . Abdominal hysterectomy  07/24/2012    Procedure: HYSTERECTOMY ABDOMINAL;  Surgeon: Janie Morning, MD PHD;  Location: [facility];   Service: Gynecology;;  . Salpingoophorectomy  07/24/2012    Procedure: SALPINGO OOPHORECTOMY;  Surgeon: Janie Morning, MD PHD;  Location: [facility];  Service: Gynecology;  Laterality: Bilateral;  . Abdominal wound dehiscence  08/01/2012    Procedure: ABDOMINAL WOUND DEHISCENCE;  Surgeon: Lahoma Crocker, MD;  Location: [facility];  Service: Gynecology;  Laterality: N/A;  . Laparotomy  08/02/2012    Procedure: EXPLORATORY LAPAROTOMY;  Surgeon: Imogene Burn. Georgette Dover, MD;  Location: [facility];  Service: General;  Laterality: N/A;  placement of abdominal wound vac  . Colonoscopy N/A 12/22/2014    Procedure: COLONOSCOPY;  Surgeon: Juanita Craver, MD;  Location[facility];  Service: Endoscopy;  Laterality: N/A;  . Esophagogastroduodenoscopy Left 01/13/2015    Procedure: ESOPHAGOGASTRODUODENOSCOPY (EGD);  Surgeon: Carol Ada, MD;  Location[facility] END[facility]COPY;  Service: Endoscopy;  Laterality: Left;  . Back surgery    . Hernia repair    . Laparotomy N/A 04/28/2015    Procedure: Abdominal Wound Explororation, Explantation of Abdominal Mesh;  Surgeon: Donnie Mesa, MD;  Location: [facility];  Service: General;  Laterality: N/A;  . Cardiac surgery    . Enterocutaneous fistula closure    . Eakin pouch     Family History  Problem Relation Age of Onset  . Diabetes Mother   . Coronary artery disease Mother   . Diabetes Sister   . Coronary artery disease Sister    Social History  Substance Use Topics  . Smoking status: Never Smoker   . Smokeless tobacco: Never Used  . Alcohol Use: No   OB History    No data available     Review of Systems  Unable to perform ROS: Mental status change      Allergies  Review of patient's allergies indicates no known allergies.  Home Medications   Prior to Admission medications   Medication Sig Start Date End Date Taking? Authorizing Provider  acetaminophen (TYLENOL) 500 MG tablet Take 1 tablet (500 mg total) by mouth every 8 (eight) hours. 08/04/15   Shanker Kristeen Mans, MD   cyanocobalamin 2000 MCG tablet Take 1 tablet (2,000 mcg total) by mouth daily. 01/07/15   Janith Lima, MD  diphenhydrAMINE (BENADRYL) 12.5 MG/5ML elixir Take 10 mLs (25 mg total) by mouth every 6 (six) hours as needed for itching. 08/04/15   Shanker Kristeen Mans, MD  diphenhydrAMINE (BENADRYL) 25 mg capsule Take 25 mg by mouth every 4 (four) hours as needed for itching.    Historical Provider, MD  escitalopram (LEXAPRO) 10 MG tablet Take 1 tablet (10 mg total) by mouth daily. 08/04/15   Shanker Kristeen Mans, MD  FeAsp-B12-FA-C-DSS-SuccAc-Zn (FERIVA 21/7) 75-1 MG TABS Take 1 tablet by mouth daily. 04/15/15   Janith Lima, MD  hydrOXYzine (ATARAX/VISTARIL) 10 MG tablet Take 0.5 tablets (5 mg total) by mouth 3 (three) times daily as needed for itching. 08/04/15   Shanker Kristeen Mans,  MD  insulin aspart (NOVOLOG) 100 UNIT/ML injection 0-9 Units, Subcutaneous, Every 4 hours (6 times per day) CBG < 70: implement hypoglycemia protocol CBG 70 - 120: 0 units CBG 121 - 150: 1 unit CBG 151 - 200: 2 units CBG 201 - 250: 3 units CBG 251 - 300: 5 units CBG 301 - 350: 7 units CBG 351 - 400: 9 units CBG > 400: call MD 08/04/15   Jonetta Osgood, MD  loperamide (IMODIUM) 2 MG capsule Take 1 capsule (2 mg total) by mouth every 6 (six) hours. 08/04/15   Shanker Kristeen Mans, MD  methocarbamol (ROBAXIN) 500 MG tablet Take 1 tablet (500 mg total) by mouth every 8 (eight) hours as needed for muscle spasms. 05/21/15   Saverio Danker, PA-C  modafinil (PROVIGIL) 100 MG tablet Take 0.5 tablets (50 mg total) by mouth daily. 08/04/15   Shanker Kristeen Mans, MD  NITROSTAT 0.4 MG SL tablet DISSOLVE ONE TABLET UNDER THE TONGUE EVERY 5 MINUTES AS NEEDED FOR CHEST PAIN.  DO NOT EXCEED A TOTAL OF 3 DOSES IN 15 MINUTES 03/30/15   Larey Dresser, MD  nystatin (MYCOSTATIN/NYSTOP) 100000 UNIT/GM POWD Apply to groins under abdomen that is wet most of the time. 08/04/15   Shanker Kristeen Mans, MD  octreotide (SANDOSTATIN) 100 MCG/ML SOLN injection  Inject 1 mL (100 mcg total) into the skin every 12 (twelve) hours. 06/15/15   Ripudeep Krystal Eaton, MD  oxycodone (OXY-IR) 5 MG capsule Take 2 capsules (10 mg total) by mouth every 6 (six) hours as needed (moderate to severe pain). 08/04/15   Shanker Kristeen Mans, MD  pantoprazole (PROTONIX) 40 MG tablet Take 1 tablet (40 mg total) by mouth daily. 12/25/14   Reyne Dumas, MD  UNABLE TO FIND [place] Clinimix 5/15 (WITH NO lytes - likely needs alternating lytes and no lytes) over 12 hours + lipids 20% at 20 ml/hr over 12 hours - provides 95gm protein/day (95% goal) and 1829 Kcal/day (100% goal) . Daily MVI and trace elements in TPN 08/03/15   Shanker Kristeen Mans, MD  VOLTAREN 1 % GEL Apply 2 g topically 4 (four) times daily as needed (pain).  12/25/14   Historical Provider, MD   BP 135/72 mmHg  Pulse 106  Resp 26  SpO2 100% Physical Exam  Constitutional: She appears well-developed and well-nourished. She appears lethargic.  Patient will open eyes briefly to voice but does not answer questions  HENT:  Head: Normocephalic and atraumatic.  Mouth/Throat: Oropharynx is clear and moist. Mucous membranes are dry.  Eyes: Conjunctivae and EOM are normal. Pupils are equal, round, and reactive to light.  Neck: Normal range of motion. Neck supple.  Cardiovascular: Regular rhythm and intact distal pulses.  Tachycardia present.   No murmur heard. Pulmonary/Chest: Effort normal and breath sounds normal. Tachypnea noted. No respiratory distress. She has no wheezes. She has no rales.  Technical difficulty exam due to body habitus and unable to sit up but no notable wheezing or rales.  Abdominal: Soft.  Decreased bowel sounds. Open draining fistula with a Eaken pouch.  Dark clear fluid in the bag.  Musculoskeletal: Normal range of motion. She exhibits no edema or tenderness.  No edema noted  Neurological: She appears lethargic.  Skin: Skin is warm and dry. No rash noted. No erythema.  Psychiatric:  Patient does not answer  questions.  Nursing note and vitals reviewed.   ED Course  Procedures (including critical care time) Labs Review Labs Reviewed  MRSA PCR SCREENING -  Abnormal; Notable for the following:    MRSA by PCR POSITIVE (*)    All other components within normal limits  CBC WITH DIFFERENTIAL/PLATELET - Abnormal; Notable for the following:    WBC 32.3 (*)    RBC 3.74 (*)    Hemoglobin 9.0 (*)    HCT 27.1 (*)    MCV 72.5 (*)    MCH 24.1 (*)    RDW 17.0 (*)    Neutro Abs 29.4 (*)    Monocytes Absolute 1.3 (*)    All other components within normal limits  COMPREHENSIVE METABOLIC PANEL - Abnormal; Notable for the following:    Chloride 117 (*)    CO2 9 (*)    Glucose, Bld 188 (*)    BUN 227 (*)    Creatinine, Ser 4.29 (*)    Calcium 10.4 (*)    Total Protein 9.9 (*)    Albumin 2.3 (*)    AST 77 (*)    ALT 155 (*)    Alkaline Phosphatase 275 (*)    GFR calc non Af Amer 9 (*)    GFR calc Af Amer 11 (*)    All other components within normal limits  MAGNESIUM - Abnormal; Notable for the following:    Magnesium 2.8 (*)    All other components within normal limits  CBC - Abnormal; Notable for the following:    WBC 21.0 (*)    RBC 3.24 (*)    Hemoglobin 7.6 (*)    HCT 23.2 (*)    MCV 71.6 (*)    MCH 23.5 (*)    RDW 16.6 (*)    All other components within normal limits  COMPREHENSIVE METABOLIC PANEL - Abnormal; Notable for the following:    Chloride 115 (*)    CO2 15 (*)    Glucose, Bld 397 (*)    BUN 201 (*)    Creatinine, Ser 4.01 (*)    Albumin 1.8 (*)    AST 71 (*)    ALT 132 (*)    Alkaline Phosphatase 235 (*)    GFR calc non Af Amer 10 (*)    GFR calc Af Amer 12 (*)    All other components within normal limits  TROPONIN I - Abnormal; Notable for the following:    Troponin I 0.06 (*)    All other components within normal limits  GLUCOSE, CAPILLARY - Abnormal; Notable for the following:    Glucose-Capillary 217 (*)    All other components within normal limits  I-STAT  TROPOININ, ED - Abnormal; Notable for the following:    Troponin i, poc 0.11 (*)    All other components within normal limits  I-STAT ARTERIAL BLOOD GAS, ED - Abnormal; Notable for the following:    pH, Arterial 7.154 (*)    pCO2 arterial 19.7 (*)    pO2, Arterial 127.0 (*)    Bicarbonate 6.9 (*)    Acid-base deficit 20.0 (*)    All other components within normal limits  CULTURE, BLOOD (ROUTINE X 2)  BRAIN NATRIURETIC PEPTIDE  PHOSPHORUS  LACTIC ACID, PLASMA  I-STAT CG4 LACTIC ACID, ED    Imaging Review Dg Chest 2 View  08/23/2015  CLINICAL DATA:  Altered mental status.  No bowel sounds. EXAM: CHEST  2 VIEW COMPARISON:  07/25/2015 FINDINGS: Shallow inspiration. Heart size and pulmonary vascularity are normal. There is developing infiltration or atelectasis in the left lung base. Pneumonia should be excluded. Nodular opacity in the left upper lung measures about  11 mm diameter. No nodules were demonstrated in this area on recent CT chest from 04/20/2015, suggesting that this may be inflammatory. Calcified and tortuous aorta. No blunting of costophrenic angles. No pneumothorax. Degenerative changes in the spine. Right central venous catheter with tip over the low SVC. IMPRESSION: Developing infiltration or atelectasis in the left lung base. Nodular opacity in the left upper lung is new since a previous chest CT of 04/20/2015 suggesting probable inflammatory etiology. Electronically Signed   By: Lucienne Capers M.D.   On: 08/23/2015 23:35   Dg Abd 1 View  08/23/2015  CLINICAL DATA:  Altered mental status.  No bowel sounds. EXAM: ABDOMEN - 1 VIEW COMPARISON:  CT abdomen and pelvis 07/13/2015 FINDINGS: Scattered gas and stool in the colon. No small or large bowel distention. Scattered surgical clips in the abdomen. Degenerative changes in the spine. No radiopaque stones. IMPRESSION: Nonobstructive bowel gas pattern. Electronically Signed   By: Lucienne Capers M.D.   On: 08/23/2015 23:33   I  have personally reviewed and evaluated these images and lab results as part of my medical decision-making.   EKG Interpretation None      MDM   Final diagnoses:  Altered mental status, unspecified altered mental status type  Acute on chronic renal insufficiency (HCC)  Hyperchloremic acidosis  Urinary tract infection without hematuria, site unspecified  Elevated troponin I level  Leukocytosis  Microcytic anemia  Abdominal pain  Bacteremia   Patient is a 74 year old female with multiple medical problems who was recently discharged from the hospital after a 22 day stay a proximally 3 weeks ago into a nursing facility. She has a history of an open draining fistulous abdominal wound, acute on chronic renal failure and not a candidate for dialysis, persistent TPN and no by mouth intake who is now having altered mental status for the last 3 days that's worsening. Patient is now having labored breathing but has clear breath sounds on exam. She is tachycardic without evidence of fluid overload. Unknown if patient has been febrile at the nursing home.   However when she was hospitalized she had septicemia and blood cultures that grew staph. Family states that she has not been on any antibiotics recently. She had blood work done 2 days ago at the nursing home that showed a white blood cell count of 14,000 with a hemoglobin of 8.6. CMP showing a creatinine of 2.78 and mild elevation of LFTs with normal magnesium and mildly elevated phosphorus. Patient also had a urine culture that grew out Pseudomonas resistant to ceftriaxone only but otherwise pansensitive. Results of these tests returned today however given patient's worsening clinical status she was sent here for further evaluation.  Concern that patient may have worsening renal failure today versus UTI and concern for sepsis with acute encephalopathy.  Palliative care was involved in patient's hospital stay when she was here the last time at that time  the family was having a difficult time accepting patient's worsening functional status and did not want to speak about end-of-life care.  X-rays today show a plain abdomen without acute findings.  CXR with concern for developing infiltrate. CBC, CMP, troponin, BMP, lactic acid, magnesium, phosphorus pending.  Blanchie Dessert, MD Sep 23, 2015 2230

## 2015-08-24 ENCOUNTER — Encounter (HOSPITAL_COMMUNITY): Payer: Self-pay | Admitting: Family Medicine

## 2015-08-24 ENCOUNTER — Inpatient Hospital Stay (HOSPITAL_COMMUNITY): Payer: Medicare Other

## 2015-08-24 DIAGNOSIS — I248 Other forms of acute ischemic heart disease: Secondary | ICD-10-CM | POA: Diagnosis present

## 2015-08-24 DIAGNOSIS — Z794 Long term (current) use of insulin: Secondary | ICD-10-CM | POA: Diagnosis not present

## 2015-08-24 DIAGNOSIS — R7881 Bacteremia: Secondary | ICD-10-CM | POA: Insufficient documentation

## 2015-08-24 DIAGNOSIS — N189 Chronic kidney disease, unspecified: Secondary | ICD-10-CM

## 2015-08-24 DIAGNOSIS — R740 Nonspecific elevation of levels of transaminase and lactic acid dehydrogenase [LDH]: Secondary | ICD-10-CM | POA: Diagnosis present

## 2015-08-24 DIAGNOSIS — D649 Anemia, unspecified: Secondary | ICD-10-CM | POA: Diagnosis present

## 2015-08-24 DIAGNOSIS — E1122 Type 2 diabetes mellitus with diabetic chronic kidney disease: Secondary | ICD-10-CM | POA: Diagnosis present

## 2015-08-24 DIAGNOSIS — E872 Acidosis, unspecified: Secondary | ICD-10-CM | POA: Diagnosis present

## 2015-08-24 DIAGNOSIS — K632 Fistula of intestine: Secondary | ICD-10-CM | POA: Diagnosis present

## 2015-08-24 DIAGNOSIS — A419 Sepsis, unspecified organism: Secondary | ICD-10-CM | POA: Diagnosis present

## 2015-08-24 DIAGNOSIS — R400 Somnolence: Secondary | ICD-10-CM

## 2015-08-24 DIAGNOSIS — T80212A Local infection due to central venous catheter, initial encounter: Secondary | ICD-10-CM | POA: Diagnosis present

## 2015-08-24 DIAGNOSIS — I129 Hypertensive chronic kidney disease with stage 1 through stage 4 chronic kidney disease, or unspecified chronic kidney disease: Secondary | ICD-10-CM | POA: Diagnosis present

## 2015-08-24 DIAGNOSIS — I5032 Chronic diastolic (congestive) heart failure: Secondary | ICD-10-CM

## 2015-08-24 DIAGNOSIS — R748 Abnormal levels of other serum enzymes: Secondary | ICD-10-CM | POA: Diagnosis present

## 2015-08-24 DIAGNOSIS — I10 Essential (primary) hypertension: Secondary | ICD-10-CM | POA: Diagnosis not present

## 2015-08-24 DIAGNOSIS — R4182 Altered mental status, unspecified: Secondary | ICD-10-CM | POA: Diagnosis present

## 2015-08-24 DIAGNOSIS — N179 Acute kidney failure, unspecified: Secondary | ICD-10-CM

## 2015-08-24 LAB — I-STAT TROPONIN, ED: Troponin i, poc: 0.11 ng/mL (ref 0.00–0.08)

## 2015-08-24 LAB — CBC WITH DIFFERENTIAL/PLATELET
BASOS PCT: 0 %
BLASTS: 0 %
Band Neutrophils: 0 %
Basophils Absolute: 0 10*3/uL (ref 0.0–0.1)
EOS ABS: 0 10*3/uL (ref 0.0–0.7)
Eosinophils Relative: 0 %
HCT: 27.1 % — ABNORMAL LOW (ref 36.0–46.0)
HEMOGLOBIN: 9 g/dL — AB (ref 12.0–15.0)
LYMPHS PCT: 5 %
Lymphs Abs: 1.6 10*3/uL (ref 0.7–4.0)
MCH: 24.1 pg — ABNORMAL LOW (ref 26.0–34.0)
MCHC: 33.2 g/dL (ref 30.0–36.0)
MCV: 72.5 fL — ABNORMAL LOW (ref 78.0–100.0)
MYELOCYTES: 0 %
Metamyelocytes Relative: 0 %
Monocytes Absolute: 1.3 10*3/uL — ABNORMAL HIGH (ref 0.1–1.0)
Monocytes Relative: 4 %
NEUTROS ABS: 29.4 10*3/uL — AB (ref 1.7–7.7)
NEUTROS PCT: 91 %
NRBC: 0 /100{WBCs}
PROMYELOCYTES ABS: 0 %
Platelets: 242 10*3/uL (ref 150–400)
RBC: 3.74 MIL/uL — AB (ref 3.87–5.11)
RDW: 17 % — ABNORMAL HIGH (ref 11.5–15.5)
WBC: 32.3 10*3/uL — AB (ref 4.0–10.5)

## 2015-08-24 LAB — COMPREHENSIVE METABOLIC PANEL
ALBUMIN: 1.8 g/dL — AB (ref 3.5–5.0)
ALBUMIN: 2.3 g/dL — AB (ref 3.5–5.0)
ALK PHOS: 235 U/L — AB (ref 38–126)
ALK PHOS: 275 U/L — AB (ref 38–126)
ALT: 132 U/L — ABNORMAL HIGH (ref 14–54)
ALT: 155 U/L — AB (ref 14–54)
ANION GAP: 12 (ref 5–15)
AST: 71 U/L — AB (ref 15–41)
AST: 77 U/L — AB (ref 15–41)
Anion gap: 12 (ref 5–15)
BILIRUBIN TOTAL: 1 mg/dL (ref 0.3–1.2)
BUN: 201 mg/dL — AB (ref 6–20)
BUN: 227 mg/dL — AB (ref 6–20)
CALCIUM: 9 mg/dL (ref 8.9–10.3)
CALCIUM: 10.4 mg/dL — AB (ref 8.9–10.3)
CO2: 15 mmol/L — ABNORMAL LOW (ref 22–32)
CO2: 9 mmol/L — AB (ref 22–32)
CREATININE: 4.01 mg/dL — AB (ref 0.44–1.00)
Chloride: 117 mmol/L — ABNORMAL HIGH (ref 101–111)
Chloride: 115 mmol/L — ABNORMAL HIGH (ref 101–111)
Creatinine, Ser: 4.29 mg/dL — ABNORMAL HIGH (ref 0.44–1.00)
GFR calc Af Amer: 11 mL/min — ABNORMAL LOW (ref >60)
GFR calc Af Amer: 12 mL/min — ABNORMAL LOW (ref >60)
GFR calc non Af Amer: 9 mL/min — ABNORMAL LOW (ref >60)
GFR, EST NON AFRICAN AMERICAN: 10 mL/min — AB (ref >60)
GLUCOSE: 397 mg/dL — AB (ref 65–99)
GLUCOSE: 188 mg/dL — AB (ref 65–99)
Potassium: 5.1 mmol/L (ref 3.5–5.1)
Potassium: 4.2 mmol/L (ref 3.5–5.1)
SODIUM: 138 mmol/L (ref 135–145)
Sodium: 142 mmol/L (ref 135–145)
TOTAL PROTEIN: 7.7 g/dL (ref 6.5–8.1)
Total Bilirubin: 1.2 mg/dL (ref 0.3–1.2)
Total Protein: 9.9 g/dL — ABNORMAL HIGH (ref 6.5–8.1)

## 2015-08-24 LAB — CBC
HEMATOCRIT: 23.2 % — AB (ref 36.0–46.0)
HEMOGLOBIN: 7.6 g/dL — AB (ref 12.0–15.0)
MCH: 23.5 pg — ABNORMAL LOW (ref 26.0–34.0)
MCHC: 32.8 g/dL (ref 30.0–36.0)
MCV: 71.6 fL — AB (ref 78.0–100.0)
Platelets: 203 10*3/uL (ref 150–400)
RBC: 3.24 MIL/uL — ABNORMAL LOW (ref 3.87–5.11)
RDW: 16.6 % — ABNORMAL HIGH (ref 11.5–15.5)
WBC: 21 10*3/uL — AB (ref 4.0–10.5)

## 2015-08-24 LAB — I-STAT ARTERIAL BLOOD GAS, ED
ACID-BASE DEFICIT: 20 mmol/L — AB (ref 0.0–2.0)
Bicarbonate: 6.9 mEq/L — ABNORMAL LOW (ref 20.0–24.0)
O2 SAT: 98 %
PH ART: 7.154 — AB (ref 7.350–7.450)
TCO2: 8 mmol/L (ref 0–100)
pCO2 arterial: 19.7 mmHg — CL (ref 35.0–45.0)
pO2, Arterial: 127 mmHg — ABNORMAL HIGH (ref 80.0–100.0)

## 2015-08-24 LAB — GLUCOSE, CAPILLARY: Glucose-Capillary: 217 mg/dL — ABNORMAL HIGH (ref 65–99)

## 2015-08-24 LAB — I-STAT CG4 LACTIC ACID, ED: Lactic Acid, Venous: 0.59 mmol/L (ref 0.5–2.0)

## 2015-08-24 LAB — TROPONIN I: TROPONIN I: 0.06 ng/mL — AB (ref <0.031)

## 2015-08-24 LAB — BRAIN NATRIURETIC PEPTIDE: B Natriuretic Peptide: 81.3 pg/mL (ref 0.0–100.0)

## 2015-08-24 LAB — PHOSPHORUS: PHOSPHORUS: 2.8 mg/dL (ref 2.5–4.6)

## 2015-08-24 LAB — MRSA PCR SCREENING: MRSA by PCR: POSITIVE — AB

## 2015-08-24 LAB — MAGNESIUM: MAGNESIUM: 2.8 mg/dL — AB (ref 1.7–2.4)

## 2015-08-24 LAB — LACTIC ACID, PLASMA: LACTIC ACID, VENOUS: 1.5 mmol/L (ref 0.5–2.0)

## 2015-08-24 MED ORDER — INSULIN ASPART 100 UNIT/ML ~~LOC~~ SOLN
0.0000 [IU] | Freq: Three times a day (TID) | SUBCUTANEOUS | Status: DC
Start: 2015-08-24 — End: 2015-08-24

## 2015-08-24 MED ORDER — SODIUM CHLORIDE 0.9 % IV SOLN
1.0000 mg/h | INTRAVENOUS | Status: DC
  Administered 2015-08-24: 1 mg/h via INTRAVENOUS
  Filled 2015-08-24: qty 10

## 2015-08-24 MED ORDER — SODIUM CHLORIDE 0.9 % IV BOLUS (SEPSIS)
1000.0000 mL | Freq: Once | INTRAVENOUS | Status: AC
  Administered 2015-08-24: 1000 mL via INTRAVENOUS

## 2015-08-24 MED ORDER — SODIUM CHLORIDE 0.9 % IJ SOLN
3.0000 mL | Freq: Two times a day (BID) | INTRAMUSCULAR | Status: DC
  Administered 2015-08-25: 3 mL via INTRAVENOUS

## 2015-08-24 MED ORDER — INSULIN ASPART 100 UNIT/ML ~~LOC~~ SOLN: 0.0000 [IU] | SUBCUTANEOUS | Status: DC

## 2015-08-24 MED ORDER — MODAFINIL 100 MG PO TABS: 50.0000 mg | ORAL_TABLET | Freq: Every day | ORAL | Status: DC

## 2015-08-24 MED ORDER — INSULIN ASPART 100 UNIT/ML ~~LOC~~ SOLN: 0.0000 [IU] | Freq: Every day | SUBCUTANEOUS | Status: DC

## 2015-08-24 MED ORDER — CHLORHEXIDINE GLUCONATE CLOTH 2 % EX PADS: 6.0000 | MEDICATED_PAD | Freq: Every day | CUTANEOUS | Status: DC

## 2015-08-24 MED ORDER — VANCOMYCIN HCL IN DEXTROSE 1-5 GM/200ML-% IV SOLN: 1000.0000 mg | INTRAVENOUS | Status: DC

## 2015-08-24 MED ORDER — OCTREOTIDE ACETATE 100 MCG/ML IJ SOLN
100.0000 ug | Freq: Two times a day (BID) | INTRAMUSCULAR | Status: DC
Start: 2015-08-24 — End: 2015-08-25
  Administered 2015-08-25 (×2): 100 ug via SUBCUTANEOUS
  Filled 2015-08-24 (×6): qty 1

## 2015-08-24 MED ORDER — LOPERAMIDE HCL 2 MG PO CAPS: 2.0000 mg | ORAL_CAPSULE | Freq: Four times a day (QID) | ORAL | Status: DC

## 2015-08-24 MED ORDER — HEPARIN SODIUM (PORCINE) 5000 UNIT/ML IJ SOLN: 5000.0000 [IU] | Freq: Three times a day (TID) | INTRAMUSCULAR | Status: DC

## 2015-08-24 MED ORDER — SODIUM BICARBONATE 8.4 % IV SOLN
INTRAVENOUS | Status: DC
  Administered 2015-08-24: 03:00:00 via INTRAVENOUS
  Filled 2015-08-24 (×3): qty 150

## 2015-08-24 MED ORDER — MORPHINE SULFATE (PF) 2 MG/ML IV SOLN
2.0000 mg | Freq: Once | INTRAVENOUS | Status: AC
Start: 2015-08-24 — End: 2015-08-24
  Administered 2015-08-24: 2 mg via INTRAVENOUS
  Filled 2015-08-24: qty 1

## 2015-08-24 MED ORDER — ACETAMINOPHEN 325 MG PO TABS: 650.0000 mg | ORAL_TABLET | ORAL | Status: DC | PRN

## 2015-08-24 MED ORDER — MUPIROCIN 2 % EX OINT
1.0000 "application " | TOPICAL_OINTMENT | Freq: Two times a day (BID) | CUTANEOUS | Status: DC
  Administered 2015-08-24 – 2015-08-25 (×2): 1 via NASAL
  Filled 2015-08-24: qty 22

## 2015-08-24 MED ORDER — NYSTATIN 100000 UNIT/GM EX POWD
1.0000 g | Freq: Three times a day (TID) | CUTANEOUS | Status: DC
  Administered 2015-08-25 (×3): 1 g via TOPICAL
  Filled 2015-08-24 (×2): qty 15

## 2015-08-24 MED ORDER — METHOCARBAMOL 500 MG PO TABS: 500.0000 mg | ORAL_TABLET | Freq: Three times a day (TID) | ORAL | Status: DC | PRN

## 2015-08-24 MED ORDER — PANTOPRAZOLE SODIUM 40 MG PO TBEC: 40.0000 mg | DELAYED_RELEASE_TABLET | Freq: Every day | ORAL | Status: DC

## 2015-08-24 MED ORDER — VANCOMYCIN HCL 10 G IV SOLR
1500.0000 mg | Freq: Once | INTRAVENOUS | Status: AC
  Administered 2015-08-24: 1500 mg via INTRAVENOUS
  Filled 2015-08-24: qty 1500

## 2015-08-24 MED ORDER — PIPERACILLIN-TAZOBACTAM IN DEX 2-0.25 GM/50ML IV SOLN
2.2500 g | Freq: Three times a day (TID) | INTRAVENOUS | Status: DC
  Administered 2015-08-24: 2.25 g via INTRAVENOUS
  Filled 2015-08-24 (×3): qty 50

## 2015-08-24 MED ORDER — PIPERACILLIN-TAZOBACTAM 3.375 G IVPB 30 MIN
3.3750 g | Freq: Once | INTRAVENOUS | Status: AC
  Administered 2015-08-24: 3.375 g via INTRAVENOUS
  Filled 2015-08-24: qty 50

## 2015-08-24 NOTE — Progress Notes (Signed)
CRITICAL VALUE ALERT  Critical value received:  MRSA positive   Date of notification:  08/24/2015  Time of notification:  0715  Critical value read back:Yes.    Nurse who received alert:  Monico Hoar, RN   MD notified (1st page):  Loleta Books, MD   Time of first page:  7:41 AM  Responding MD:  Loleta Books, MD   Time MD responded:  7:41 AM

## 2015-08-24 NOTE — Progress Notes (Signed)
Report called to Joellen Jersey, RN 5W. Pt resting comfortably at this time, morphine gtt infusing through R chest PICC. Family at bedside. Pt to transfer by bed [id number].

## 2015-08-24 NOTE — Progress Notes (Signed)
NURSING PROGRESS NOTE  Marissa [name] [id number] Transfer Data: 08/24/2015 12:38 PM Attending Provider: Delfina Redwood, MD [id number] Ronnald Ramp, MD Code Status: DNR  Marissa [name] is a 74 y.o. female patient transferred from [facility]  -No acute distress noted.    Blood pressure 167/76, pulse 119, temperature 98.5 F (36.9 C), temperature source Axillary, resp. rate 20, height 5\' 4"  (1.626 m), weight 91 kg (200 lb 9.9 oz), SpO2 100 %.   IV Fluids:  IV in place, Morphine drip noted. Comfort measures maintained. Daughter in at the bedside, does not want vitals at this time, does not want me to assess at this time, pt. Sleeping at this time.   Allergies:  Review of patient's allergies indicates no known allergies.  Past Medical History:   has a past medical history of Hypertension; Hyperlipidemia; History of DVT (deep vein thrombosis); History of colonic polyps; Gout; Polyarthritis; Microcytic anemia; GERD (gastroesophageal reflux disease); Esophagitis; Hidradenitis suppurativa; H/O blood transfusion reaction; Diverticulitis; Difficulty sleeping; PMB (postmenopausal bleeding); Bowel trouble; Fibroids; CAD (coronary artery disease); Peripheral neuropathy ([place]); Chronic diastolic CHF (congestive heart failure) ([place]) (06/29/2014); Renal insufficiency; Chronic kidney disease (CKD), stage III (moderate); OSA on CPAP; History of hiatal hernia; Headache(784.0); Enterocutaneous fistula; and Type II diabetes mellitus ([place]).  Past Surgical History:   has past surgical history that includes Anterior cervical decomp/discectomy fusion; Cervical laminectomy; Umbilical hernia repair; Axillary hidradenitis excision (Bilateral); Knee arthroscopy (Right); Colectomy (Left, 2004); Cholecystectomy; Tubal ligation; Coronary angioplasty with stent; Dilation and curettage of uterus (02/2011); Hysteroscopy w/D&C (05/04/2012); Robotic assisted total hysterectomy with bilateral salpingo oophorectomy (07/24/2012); Abdominal  hysterectomy (07/24/2012); Salpingoophorectomy (07/24/2012); Abdominal wound dehiscence (08/01/2012); laparotomy (08/02/2012); Colonoscopy (N/A, 12/22/2014); Esophagogastroduodenoscopy (Left, 01/13/2015); Back surgery; Hernia repair; laparotomy (N/A, 04/28/2015); Cardiac surgery; Eenterocutaneous fistula closure; and eakin pouch.  Social History:   reports that she has never smoked. She has never used smokeless tobacco. She reports that she does not drink alcohol or use illicit drugs.  Skin: Intact  Family orientated to room. Information packet given to family. Admission inpatient armband information verified with family to include name and date of birth and placed on patient arm. Side rails up x 2, fall assessment and education completed with patient/family.Family able to verbalize understanding of risk associated with falls and verbalized understanding to call for assistance before getting out of bed. Call light within reach.Family able to voice and demonstrate understanding of unit orientation instructions.    Will continue to evaluate and treat per MD orders.

## 2015-08-24 NOTE — Progress Notes (Signed)
MD Danford made aware that PICC line was found dirty with dirty caps, RN to do dressing change on PICC and noted to have white discharge around insertion site. When pressing on insertion site more white discharge present. Pt bolus is running, pt having shallow respirations of 22 per/min. MD aware and will pass along to next shift.

## 2015-08-24 NOTE — Progress Notes (Signed)
CSW confirmed pt is from [employer]- per RN pt is now comfort care and possible hospital death.  CSW signing off at this time.  Domenica Reamer, [place] Social Worker [phone]

## 2015-08-24 NOTE — Progress Notes (Signed)
Utilization review completed. Kamyia Thomason, RN, BSN. 

## 2015-08-24 NOTE — Progress Notes (Signed)
ANTIBIOTIC CONSULT NOTE - INITIAL  Pharmacy Consult for Vancomycin and Zosyn  Indication: rule out sepsis  No Known Allergies  Patient Measurements: Height: 5\' 4"  (162.6 cm) Weight: 200 lb 9.9 oz (91 kg) IBW/kg (Calculated) : 54.7 Adjusted Body Weight: 70 kg  Vital Signs: Temp: 98.5 F (36.9 C) (12/12 0500) Temp Source: Axillary (12/12 0500) BP: 144/77 mmHg (12/12 0500) Pulse Rate: 103 (12/12 0300) Intake/Output from previous day:   Intake/Output from this shift:    Labs:  Recent Labs  08/24/15 0025  WBC 32.3*  HGB 9.0*  PLT 242  CREATININE 4.29*   Estimated Creatinine Clearance: 12.6 mL/min (by C-G formula based on Cr of 4.29). No results for input(s): VANCOTROUGH, VANCOPEAK, VANCORANDOM, GENTTROUGH, GENTPEAK, GENTRANDOM, TOBRATROUGH, TOBRAPEAK, TOBRARND, AMIKACINPEAK, AMIKACINTROU, AMIKACIN in the last 72 hours.   Microbiology: No results found for this or any previous visit (from the past 720 hour(s)).  Medical History: Past Medical History  Diagnosis Date  . Hypertension   . Hyperlipidemia   . History of DVT (deep vein thrombosis)   . History of colonic polyps   . Gout   . Polyarthritis   . Microcytic anemia   . GERD (gastroesophageal reflux disease)   . Esophagitis   . Hidradenitis suppurativa     s/p axillary sweat gland removal  . H/O blood transfusion reaction     at [facility] hospital  . Diverticulitis   . Difficulty sleeping   . PMB (postmenopausal bleeding)     X 2 YRS  . Bowel trouble     OCCASIONAL BOWEL INCONTINENCE  . Fibroids     s/p TAH/BSO 07/2012  . CAD (coronary artery disease)     a. 8/07 had BMS to OM.  b. In-stent restenosis with later Promus DES to same site.  c. Lexiscan myoview in 1/13 showed EF 67%, no ischemia or infarction.  d. Echo (2/13) showed EF 55-60%, moderate LVH, mild MR.  e. Lex MV 6/14: no isch, EF 53%;  f. Echo 6/14: mild LVH, EF 55-60%, Gr 1 DD, MAC, mild MR, mild LAE  . Peripheral neuropathy ([place])   . Chronic  diastolic CHF (congestive heart failure) ([place]) 06/29/2014  . Renal insufficiency     baseline Cr ~ 1.3  . Chronic kidney disease (CKD), stage III (moderate)     Archie Endo 04/27/2015  . OSA on CPAP     "wear it sometimes" (04/27/2015)  . History of hiatal hernia   . [id number])     "weekly" (04/27/2015)  . Enterocutaneous fistula     "have had it 2-3 months now" 04/27/2015  . Type II diabetes mellitus (HCC)     diet controlled: pt's daughter denies mother's hx of diabetes    Medications:  Prescriptions prior to admission  Medication Sig Dispense Refill Last Dose  . acetaminophen (TYLENOL) 325 MG tablet Take 650 mg by mouth every 4 (four) hours as needed for mild pain or fever.   08/20/2015  . acetaminophen (TYLENOL) 500 MG tablet Take 1 tablet (500 mg total) by mouth every 8 (eight) hours. (Patient taking differently: Take 500 mg by mouth every 8 (eight) hours as needed for mild pain. )   unk  . cyanocobalamin 2000 MCG tablet Take 1 tablet (2,000 mcg total) by mouth daily. 90 tablet 3 08/23/2015 at Unknown time  . diphenhydrAMINE (BENADRYL) 25 mg capsule Take 25 mg by mouth every 4 (four) hours as needed for itching.   08/15/2015  . escitalopram (LEXAPRO) 10 MG tablet Take 1  tablet (10 mg total) by mouth daily.   08/23/2015 at Unknown time  . hydrOXYzine (ATARAX/VISTARIL) 10 MG tablet Take 0.5 tablets (5 mg total) by mouth 3 (three) times daily as needed for itching. (Patient taking differently: Take 10 mg by mouth every 8 (eight) hours as needed for itching. )   unk  . insulin aspart (NOVOLOG) 100 UNIT/ML injection 0-9 Units, Subcutaneous, Every 4 hours (6 times per day) CBG < 70: implement hypoglycemia protocol CBG 70 - 120: 0 units CBG 121 - 150: 1 unit CBG 151 - 200: 2 units CBG 201 - 250: 3 units CBG 251 - 300: 5 units CBG 301 - 350: 7 units CBG 351 - 400: 9 units CBG > 400: call MD   08/23/2015 at Unknown time  . loperamide (IMODIUM) 2 MG capsule Take 1 capsule (2 mg total) by  mouth every 6 (six) hours.   08/23/2015 at Unknown time  . methocarbamol (ROBAXIN) 500 MG tablet Take 1 tablet (500 mg total) by mouth every 8 (eight) hours as needed for muscle spasms. 40 tablet 0 unk  . modafinil (PROVIGIL) 100 MG tablet Take 0.5 tablets (50 mg total) by mouth daily. (Patient taking differently: Take 100 mg by mouth daily. )   08/23/2015 at Unknown time  . Multiple Vitamin (MULTIVITAMIN) LIQD Inject 10 mLs into the vein daily.   08/23/2015 at Unknown time  . NITROSTAT 0.4 MG SL tablet DISSOLVE ONE TABLET UNDER THE TONGUE EVERY 5 MINUTES AS NEEDED FOR CHEST PAIN.  DO NOT EXCEED A TOTAL OF 3 DOSES IN 15 MINUTES 100 tablet 0 unk  . nystatin (MYCOSTATIN) 100000 UNIT/ML suspension Swish and swallow 5 mLs 4 (four) times daily.   08/23/2015 at Unknown time  . nystatin (MYCOSTATIN/NYSTOP) 100000 UNIT/GM POWD Apply to groins under abdomen that is wet most of the time. (Patient taking differently: Apply 1 g topically 3 (three) times daily. Apply to groins under abdomen that is wet most of the time.)  0 08/23/2015 at Unknown time  . octreotide (SANDOSTATIN) 100 MCG/ML SOLN injection Inject 1 mL (100 mcg total) into the skin every 12 (twelve) hours. 60 mL 0 08/23/2015 at Unknown time  . oxycodone (OXY-IR) 5 MG capsule Take 2 capsules (10 mg total) by mouth every 6 (six) hours as needed (moderate to severe pain). 30 capsule 0 unk  . pantoprazole (PROTONIX) 40 MG tablet Take 1 tablet (40 mg total) by mouth daily. 30 tablet 2 08/23/2015 at Unknown time  . TPN ADULT Inject into the vein continuous.   08/23/2015 at Unknown time  . UNABLE TO FIND Cyle Clinimix 5/15 (WITH NO lytes - likely needs alternating lytes and no lytes) over 12 hours + lipids 20% at 20 ml/hr over 12 hours - provides 95gm protein/day (95% goal) and 1829 Kcal/day (100% goal) . Daily MVI and trace elements in TPN 20000 L 0 08/23/2015 at Unknown time   Assessment: 74 y.o. female with bacteremia/sepsis for empiric antibiotics   Vancomycin 1500 mg IV given in ED at 0100    Goal of Therapy:  Vancomycin trough level 15-20 mcg/ml  Plan:  Vancomycin 1 g IV q72h Zosyn 2.25 g IV q8h F/U renal function  Caryl Pina 08/24/2015,6:24 AM

## 2015-08-24 NOTE — Progress Notes (Signed)
Nutrition Brief Note  Pt identified on the Malnutrition Screening Tool Report. Pt now transitioning to comfort care.  No nutrition interventions warranted at this time.  Please consult as needed.   Katie Donie Lemelin, RD, LDN Pager #: [phone] After-Hours Pager #: [phone]    

## 2015-08-24 NOTE — ED Provider Notes (Signed)
Care seems from Dr. Maryan Rued. Patient presented with decreased level of consciousness. Recent urinalysis at nursing home showed UTI from Pseudomonas which was generally sensitive to most antibiotics. Chest x-ray had shown questionable left lobe pneumonia versus atelectasis. Laboratory workup shows worsening of chronic renal insufficiency with dramatic increase in BUN. Marked leukocytosis is noted. Chronic anemia is essentially unchanged. Mild elevation of troponin is felt to be demand ischemia as she clinically does not have an acute coronary syndrome. She started on antibiotics for both hospital-acquired pneumonia and hospital-acquired urinary tract infection. Case is discussed with Dr. Loleta Books of [facility] agrees to admit the patient.  Results for orders placed or performed during the hospital encounter of 08/23/15  CBC with Differential/Platelet  Result Value Ref Range   WBC 32.3 (H) 4.0 - 10.5 K/uL   RBC 3.74 (L) 3.87 - 5.11 MIL/uL   Hemoglobin 9.0 (L) 12.0 - 15.0 g/dL   HCT 27.1 (L) 36.0 - 46.0 %   MCV 72.5 (L) 78.0 - 100.0 fL   MCH 24.1 (L) 26.0 - 34.0 pg   MCHC 33.2 30.0 - 36.0 g/dL   RDW 17.0 (H) 11.5 - 15.5 %   Platelets 242 150 - 400 K/uL   Neutrophils Relative % 91 %   Lymphocytes Relative 5 %   Monocytes Relative 4 %   Eosinophils Relative 0 %   Basophils Relative 0 %   Band Neutrophils 0 %   Metamyelocytes Relative 0 %   Myelocytes 0 %   Promyelocytes Absolute 0 %   Blasts 0 %   nRBC 0 0 /100 WBC   Neutro Abs 29.4 (H) 1.7 - 7.7 K/uL   Lymphs Abs 1.6 0.7 - 4.0 K/uL   Monocytes Absolute 1.3 (H) 0.1 - 1.0 K/uL   Eosinophils Absolute 0.0 0.0 - 0.7 K/uL   Basophils Absolute 0.0 0.0 - 0.1 K/uL   RBC Morphology POLYCHROMASIA PRESENT    WBC Morphology MILD LEFT SHIFT (1-5% METAS, OCC MYELO, OCC BANDS)   Comprehensive metabolic panel  Result Value Ref Range   Sodium 138 135 - 145 mmol/L   Potassium 5.1 3.5 - 5.1 mmol/L   Chloride 117 (H) 101 - 111 mmol/L   CO2 9 (L) 22 -  32 mmol/L   Glucose, Bld 188 (H) 65 - 99 mg/dL   BUN 227 (H) 6 - 20 mg/dL   Creatinine, Ser 4.29 (H) 0.44 - 1.00 mg/dL   Calcium 10.4 (H) 8.9 - 10.3 mg/dL   Total Protein 9.9 (H) 6.5 - 8.1 g/dL   Albumin 2.3 (L) 3.5 - 5.0 g/dL   AST 77 (H) 15 - 41 U/L   ALT 155 (H) 14 - 54 U/L   Alkaline Phosphatase 275 (H) 38 - 126 U/L   Total Bilirubin 1.2 0.3 - 1.2 mg/dL   GFR calc non Af Amer 9 (L) >60 mL/min   GFR calc Af Amer 11 (L) >60 mL/min   Anion gap 12 5 - 15  Brain natriuretic peptide  Result Value Ref Range   B Natriuretic Peptide 81.3 0.0 - 100.0 pg/mL  Magnesium  Result Value Ref Range   Magnesium 2.8 (H) 1.7 - 2.4 mg/dL  Phosphorus  Result Value Ref Range   Phosphorus 2.8 2.5 - 4.6 mg/dL  I-stat troponin, ED  Result Value Ref Range   Troponin i, poc 0.11 (HH) 0.00 - 0.08 ng/mL   Comment NOTIFIED PHYSICIAN    Comment 3          I-Stat CG4  Lactic Acid, ED  Result Value Ref Range   Lactic Acid, Venous 0.59 0.5 - 2.0 mmol/L   Dg Chest 2 View  08/23/2015  CLINICAL DATA:  Altered mental status.  No bowel sounds. EXAM: CHEST  2 VIEW COMPARISON:  07/25/2015 FINDINGS: Shallow inspiration. Heart size and pulmonary vascularity are normal. There is developing infiltration or atelectasis in the left lung base. Pneumonia should be excluded. Nodular opacity in the left upper lung measures about 11 mm diameter. No nodules were demonstrated in this area on recent CT chest from 04/20/2015, suggesting that this may be inflammatory. Calcified and tortuous aorta. No blunting of costophrenic angles. No pneumothorax. Degenerative changes in the spine. Right central venous catheter with tip over the low SVC. IMPRESSION: Developing infiltration or atelectasis in the left lung base. Nodular opacity in the left upper lung is new since a previous chest CT of 04/20/2015 suggesting probable inflammatory etiology. Electronically Signed   By: Lucienne Capers M.D.   On: 08/23/2015 23:35   Dg Abd 1  View  08/23/2015  CLINICAL DATA:  Altered mental status.  No bowel sounds. EXAM: ABDOMEN - 1 VIEW COMPARISON:  CT abdomen and pelvis 07/13/2015 FINDINGS: Scattered gas and stool in the colon. No small or large bowel distention. Scattered surgical clips in the abdomen. Degenerative changes in the spine. No radiopaque stones. IMPRESSION: Nonobstructive bowel gas pattern. Electronically Signed   By: Lucienne Capers M.D.   On: 08/23/2015 23:33   Ir Fluoro Guide Cv Line Right  07/27/2015  INDICATION: 74 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 Patient also has chronic kidney disease. Patient needs IV access for TPN and antibiotics. EXAM: FLUOROSCOPIC AND ULTRASOUND GUIDED PLACEMENT OF A TUNNELED CENTRAL VENOUS CATHETER Physician: Stephan Minister. Henn, MD FLUOROSCOPY TIME:  54 seconds, 23 mGy MEDICATIONS: 1 mg Versed, 25 mcg fentanyl. A radiology nurse monitored the patient for moderate sedation. ANESTHESIA/SEDATION: Moderate sedation time: 20 minutes PROCEDURE: Informed consent was obtained for placement of a tunneled central venous catheter. The patient was placed supine on the interventional table. Ultrasound confirmed a patent right internal jugularvein. Ultrasound images were obtained for documentation. The right side of the neck was prepped and draped in a sterile fashion. The right side of the neck was anesthetized with 1% lidocaine. Maximal barrier sterile technique was utilized including caps, mask, sterile gowns, sterile gloves, sterile drape, hand hygiene and skin antiseptic. A small incision was made with #11 blade scalpel. A 21 gauge needle directed into the right internal jugular vein with ultrasound guidance. A micropuncture dilator set was placed. A dual lumen Powerline catheter was selected. The skin below the right clavicle was anesthetized and a small incision was made with an #11 blade scalpel. A subcutaneous tunnel was formed to the vein dermatotomy site. The catheter was brought through the tunnel and  cut to 22 cm. The vein dermatotomy site was dilated to accommodate a peel-away sheath. The catheter was placed through the peel-away sheath and directed into the central venous structures. The tip of the catheter was placed at the superior cavoatrial junction with fluoroscopy. Fluoroscopic images were obtained for documentation. Both lumens were found to aspirate and flush well. The vein dermatotomy site was closed using a single layer of absorbable suture and Dermabond. The catheter was secured to the skin using Prolene suture. FINDINGS: Catheter tip at the superior cavoatrial junction. Estimated blood loss: Minimal COMPLICATIONS: None IMPRESSION: Successful placement of a right jugular tunneled central venous catheter using ultrasound and fluoroscopic guidance. Electronically Signed   By: Quita Skye  Anselm Pancoast M.D.   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 Patient also has chronic kidney disease. Patient needs IV access for TPN and antibiotics. EXAM: FLUOROSCOPIC AND ULTRASOUND GUIDED PLACEMENT OF A TUNNELED CENTRAL VENOUS CATHETER Physician: Stephan Minister. Henn, MD FLUOROSCOPY TIME:  54 seconds, 23 mGy MEDICATIONS: 1 mg Versed, 25 mcg fentanyl. A radiology nurse monitored the patient for moderate sedation. ANESTHESIA/SEDATION: Moderate sedation time: 20 minutes PROCEDURE: Informed consent was obtained for placement of a tunneled central venous catheter. The patient was placed supine on the interventional table. Ultrasound confirmed a patent right internal jugularvein. Ultrasound images were obtained for documentation. The right side of the neck was prepped and draped in a sterile fashion. The right side of the neck was anesthetized with 1% lidocaine. Maximal barrier sterile technique was utilized including caps, mask, sterile gowns, sterile gloves, sterile drape, hand hygiene and skin antiseptic. A small incision was made with #11 blade scalpel. A  21 gauge needle directed into the right internal jugular vein with ultrasound guidance. A micropuncture dilator set was placed. A dual lumen Powerline catheter was selected. The skin below the right clavicle was anesthetized and a small incision was made with an #11 blade scalpel. A subcutaneous tunnel was formed to the vein dermatotomy site. The catheter was brought through the tunnel and cut to 22 cm. The vein dermatotomy site was dilated to accommodate a peel-away sheath. The catheter was placed through the peel-away sheath and directed into the central venous structures. The tip of the catheter was placed at the superior cavoatrial junction with fluoroscopy. Fluoroscopic images were obtained for documentation. Both lumens were found to aspirate and flush well. The vein dermatotomy site was closed using a single layer of absorbable suture and Dermabond. The catheter was secured to the skin using Prolene suture. FINDINGS: Catheter tip at the superior cavoatrial junction. Estimated blood loss: Minimal COMPLICATIONS: None IMPRESSION: Successful placement of a right jugular tunneled central venous catheter using ultrasound and fluoroscopic guidance. Electronically Signed   By: Markus Daft M.D.   On: 07/27/2015 17:40   Dg Chest Port 1 View  07/25/2015  CLINICAL DATA:  PICC line placement EXAM: PORTABLE CHEST 1 VIEW COMPARISON:  07/13/2015 FINDINGS: Left PICC line tip is at the innominate SVC junction. This could be advanced 5 cm to the SVC RA junction. Normal heart size and vascularity. Minor streaky left base atelectasis. Right lung remains clear. No edema, effusion, or pneumothorax. Trachea midline. Atherosclerosis of the aorta. IMPRESSION: Left PICC line tip at the innominate SVC junction and can be advanced 5 cm to the SVC RA junction. Left base atelectasis Thoracic atherosclerosis Electronically Signed   By: Jerilynn Mages.  Shick M.D.   On: 07/25/2015 [id number]      Delora Fuel, MD [id number]

## 2015-08-24 NOTE — Progress Notes (Signed)
Pt admitted from [facility]. Pt is responsive to pain only, family have been notified of pts arrival. Midline incision with a colostomy pouch present on pt. Will monitor.

## 2015-08-24 NOTE — H&P (Signed)
History and Physical  Patient Name: Marissa [name]     [id number]    DOB: [birth year]    DOA: 08/23/2015 Referring physician: Delora Fuel PCP: [name], MD      Chief Complaint: Respiratory distress from facility  HPI: Marissa [name] is a 74 y.o. 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  History is collected from EDP and from on-call RN at the patient's facility.  Facility reports that the patient had been stable since discharge on 11/22 until about two days ago when a blood culture grew GPC in clusters.  She was started on ceftriaxone the next day.  Today, the patient was noted to be having increased respiratory effort, and so she was transported to the ED.  In the ED, the patient had a low grade fever, tachycardia, kussmaul respirations.  Her BP was normal, her WBC was 32K/uL, lactic acid was normal, abdomen was painful, and bicarbonate was 9 mg/dL and serum creatinine was up to 4.29 mg/dl from 3.62 mg/dL at her last discharge.  Vancomycin and piperacillin-tazobactam were administered and TR[facility] were asked to admit for presumed bacteremia sepsis given recent positive blood culture.     Review of Systems:  Unable to obtain due to patient mentation.  No Known Allergies  Prior to Admission medications   Medication Sig Start Date End Date Taking? Authorizing Provider  acetaminophen (TYLENOL) 325 MG tablet Take 650 mg by mouth every 4 (four) hours as needed for mild pain or fever.   Yes Historical Provider, MD  acetaminophen (TYLENOL) 500 MG tablet Take 1 tablet (500 mg total) by mouth every 8 (eight) hours. Patient taking differently: Take 500 mg by mouth every 8 (eight) hours as needed for mild pain.  08/04/15  Yes Shanker Kristeen Mans, MD  cyanocobalamin 2000 MCG tablet Take 1 tablet (2,000 mcg total) by mouth daily. 01/07/15  Yes Janith Lima, MD  diphenhydrAMINE  (BENADRYL) 25 mg capsule Take 25 mg by mouth every 4 (four) hours as needed for itching.   Yes Historical Provider, MD  escitalopram (LEXAPRO) 10 MG tablet Take 1 tablet (10 mg total) by mouth daily. 08/04/15  Yes Shanker Kristeen Mans, MD  hydrOXYzine (ATARAX/VISTARIL) 10 MG tablet Take 0.5 tablets (5 mg total) by mouth 3 (three) times daily as needed for itching. Patient taking differently: Take 10 mg by mouth every 8 (eight) hours as needed for itching.  08/04/15  Yes Shanker Kristeen Mans, MD  insulin aspart (NOVOLOG) 100 UNIT/ML injection 0-9 Units, Subcutaneous, Every 4 hours (6 times per day) CBG < 70: implement hypoglycemia protocol CBG 70 - 120: 0 units CBG 121 - 150: 1 unit CBG 151 - 200: 2 units CBG 201 - 250: 3 units CBG 251 - 300: 5 units CBG 301 - 350: 7 units CBG 351 - 400: 9 units CBG > 400: call MD 08/04/15  Yes Shanker Kristeen Mans, MD  loperamide (IMODIUM) 2 MG capsule Take 1 capsule (2 mg total) by mouth every 6 (six) hours. 08/04/15  Yes Shanker Kristeen Mans, MD  methocarbamol (ROBAXIN) 500 MG tablet Take 1 tablet (500 mg total) by mouth every 8 (eight) hours as needed for muscle spasms. 05/21/15  Yes Saverio Danker, PA-C  modafinil (PROVIGIL) 100 MG tablet Take 0.5 tablets (50 mg total) by mouth daily. Patient taking differently: Take 100 mg by mouth daily.  08/04/15  Yes Shanker Kristeen Mans, MD  Multiple Vitamin (MULTIVITAMIN)  LIQD Inject 10 mLs into the vein daily.   Yes Historical Provider, MD  NITROSTAT 0.4 MG SL tablet DISSOLVE ONE TABLET UNDER THE TONGUE EVERY 5 MINUTES AS NEEDED FOR CHEST PAIN.  DO NOT EXCEED A TOTAL OF 3 DOSES IN 15 MINUTES 03/30/15  Yes Larey Dresser, MD  nystatin (MYCOSTATIN) 100000 UNIT/ML suspension Swish and swallow 5 mLs 4 (four) times daily.   Yes Historical Provider, MD  nystatin (MYCOSTATIN/NYSTOP) 100000 UNIT/GM POWD Apply to groins under abdomen that is wet most of the time. Patient taking differently: Apply 1 g topically 3 (three) times daily. Apply to  groins under abdomen that is wet most of the time. 08/04/15  Yes Shanker Kristeen Mans, MD  octreotide (SANDOSTATIN) 100 MCG/ML SOLN injection Inject 1 mL (100 mcg total) into the skin every 12 (twelve) hours. 06/15/15  Yes Ripudeep Krystal Eaton, MD  oxycodone (OXY-IR) 5 MG capsule Take 2 capsules (10 mg total) by mouth every 6 (six) hours as needed (moderate to severe pain). 08/04/15  Yes Shanker Kristeen Mans, MD  pantoprazole (PROTONIX) 40 MG tablet Take 1 tablet (40 mg total) by mouth daily. 12/25/14  Yes Reyne Dumas, MD  TPN ADULT Inject into the vein continuous.   Yes Historical Provider, MD  UNABLE TO FIND Cyle Clinimix 5/15 (WITH NO lytes - likely needs alternating lytes and no lytes) over 12 hours + lipids 20% at 20 ml/hr over 12 hours - provides 95gm protein/day (95% goal) and 1829 Kcal/day (100% goal) . Daily MVI and trace elements in TPN 08/03/15  Yes Shanker Kristeen Mans, MD    Past Medical History  Diagnosis Date  . Hypertension   . Hyperlipidemia   . History of DVT (deep vein thrombosis)   . History of colonic polyps   . Gout   . Polyarthritis   . Microcytic anemia   . GERD (gastroesophageal reflux disease)   . Esophagitis   . Hidradenitis suppurativa     s/p axillary sweat gland removal  . H/O blood transfusion reaction     at [facility] hospital  . Diverticulitis   . Difficulty sleeping   . PMB (postmenopausal bleeding)     X 2 YRS  . Bowel trouble     OCCASIONAL BOWEL INCONTINENCE  . Fibroids     s/p TAH/BSO 07/2012  . CAD (coronary artery disease)     a. 8/07 had BMS to OM.  b. In-stent restenosis with later Promus DES to same site.  c. Lexiscan myoview in 1/13 showed EF 67%, no ischemia or infarction.  d. Echo (2/13) showed EF 55-60%, moderate LVH, mild MR.  e. Lex MV 6/14: no isch, EF 53%;  f. Echo 6/14: mild LVH, EF 55-60%, Gr 1 DD, MAC, mild MR, mild LAE  . Peripheral neuropathy ([place])   . Chronic diastolic CHF (congestive heart failure) ([place]) 06/29/2014  . Renal insufficiency      baseline Cr ~ 1.3  . Chronic kidney disease (CKD), stage III (moderate)     Archie Endo 04/27/2015  . OSA on CPAP     "wear it sometimes" (04/27/2015)  . History of hiatal hernia   . [id number])     "weekly" (04/27/2015)  . Enterocutaneous fistula     "have had it 2-3 months now" 04/27/2015  . Type II diabetes mellitus (HCC)     diet controlled: pt's daughter denies mother's hx of diabetes    Past Surgical History  Procedure Laterality Date  . Anterior cervical decomp/discectomy fusion    .  Cervical laminectomy    . Umbilical hernia repair    . Axillary hidradenitis excision Bilateral   . Knee arthroscopy Right   . Colectomy Left 2004    left partial for perforation  . Cholecystectomy    . Tubal ligation    . Coronary angioplasty with stent placement      of mid and distal circumflex coronary artery. 1st stent was 04/2006, with drug eluting stent placed on 12/20/07 (30-50% RCA stenosis). Cardiologist Dr. Einar Gip.  . Dilation and curettage of uterus  02/2011  . Hysteroscopy w/d&c  05/04/2012    Procedure: DILATATION AND CURETTAGE /HYSTEROSCOPY;  Surgeon: Lahoma Crocker, MD;  Location: [facility];  Service: Gynecology;  Laterality: N/A;  . Robotic assisted total hysterectomy with bilateral salpingo oopherectomy  07/24/2012    Procedure: ROBOTIC ASSISTED TOTAL HYSTERECTOMY WITH BILATERAL SALPINGO OOPHORECTOMY;  Surgeon: Janie Morning, MD PHD;  Location: [facility];  Service: Gynecology;  Laterality: Bilateral;  attempted ,  converted to abdomnial hysterectomy  . Abdominal hysterectomy  07/24/2012    Procedure: HYSTERECTOMY ABDOMINAL;  Surgeon: Janie Morning, MD PHD;  Location: [facility];  Service: Gynecology;;  . Salpingoophorectomy  07/24/2012    Procedure: SALPINGO OOPHORECTOMY;  Surgeon: Janie Morning, MD PHD;  Location: [facility];  Service: Gynecology;  Laterality: Bilateral;  . Abdominal wound dehiscence  08/01/2012    Procedure: ABDOMINAL WOUND DEHISCENCE;  Surgeon: Lahoma Crocker, MD;   Location: [facility];  Service: Gynecology;  Laterality: N/A;  . Laparotomy  08/02/2012    Procedure: EXPLORATORY LAPAROTOMY;  Surgeon: Imogene Burn. Georgette Dover, MD;  Location: [facility];  Service: General;  Laterality: N/A;  placement of abdominal wound vac  . Colonoscopy N/A 12/22/2014    Procedure: COLONOSCOPY;  Surgeon: Juanita Craver, MD;  Location: [facility] ENDOSCOPY;  Service: Endoscopy;  Laterality: N/A;  . Esophagogastroduodenoscopy Left 01/13/2015    Procedure: ESOPHAGOGASTRODUODENOSCOPY (EGD);  Surgeon: Carol Ada, MD;  Location[facility] ENDOSCOPY;  Service: Endoscopy;  Laterality: Left;  . Back surgery    . Hernia repair    . Laparotomy N/A 04/28/2015    Procedure: Abdominal Wound Explororation, Explantation of Abdominal Mesh;  Surgeon: Donnie Mesa, MD;  Location: [place];  Service: General;  Laterality: N/A;  . Cardiac surgery    . Enterocutaneous fistula closure    . Eakin pouch      Family history: family history includes Coronary artery disease in her mother and sister; Diabetes in her mother and sister.  Social History: Patient lives in a nursing home.  She is a non-smoekr.      Physical Exam: BP 163/81 mmHg  Pulse 103  Resp 22  SpO2 100% General appearance: Obese adult female, lethargic and working hard to breath.  Eyes: Anicteric, lids and lashes normal.    Eyes closed. ENT: No nasal deformity, discharge, or epistaxis.  OP dry, crusting on teeth, won't open mouth.   Lymph: No cervical lymphadenopathy appreciated or neck masses. Skin: Warm and dry.  The fistula output is watery and dark brown or red. Cardiac: tachycardic, regular, nl S1-S2, no murmurs appreciated.   Respiratory: Kussmaul pattern, no rales appreciated. Abdomen: Abdomen soft but clearly causes pain.     Neuro: Obtunded. Groans to stimuli.  Does not follow commands. Psych: Unable to obtain.       Labs on Admission:  The metabolic panel shows potassium high normal, bicarbonate 9, AG normal, serum creatinine acute on  chronic. The transaminases and alkaline phosphatase are elevated. Magnesium is elevated. The troponin is minimally elevated. The  phosphous is normal. The BNP is normal. The complete blood count shows leukocytosis marked and anemia.   Radiological Exams on Admission: Personally reviewed: Ct Abdomen Wo Contrast  08/24/2015  CLINICAL DATA:  Acute onset of decreased level of consciousness. Urinary tract infection. Severe leukocytosis. Assess for pneumonia, given opacity on chest radiograph. Initial encounter. EXAM: CT ABDOMEN WITHOUT CONTRAST TECHNIQUE: Multidetector CT imaging of the abdomen was performed following the standard protocol without IV contrast. COMPARISON:  Chest radiograph performed 08/23/2015, and CT of the abdomen and pelvis performed 07/13/2015 FINDINGS: Mild left basilar airspace opacity may reflect atelectasis or pneumonia, though opacity along the medial left lung base is more suggestive of pneumonia given clinical concern. The liver and spleen are unremarkable in appearance. The patient is status post cholecystectomy, with clips noted at the gallbladder fossa. The pancreas and adrenal glands are unremarkable. The kidneys are unremarkable in appearance. There is no evidence of hydronephrosis. No renal or ureteral stones are seen. No perinephric stranding is appreciated. No free fluid is identified. Visualized small and large bowel loops are unremarkable in appearance, aside from mild diverticulosis along the proximal sigmoid colon. A high-density focus along the proximal sigmoid colon is nonspecific and may simply reflect contrast. The stomach is within normal limits. No acute vascular abnormalities are seen. Mild scattered calcification is noted along the abdominal aorta and its branches. No acute osseous abnormalities are identified. Facet disease is noted along the lumbar spine. IMPRESSION: Mild left basilar airspace opacity may reflect atelectasis or pneumonia, though opacity along  the medial left lung base is more suggestive of pneumonia given clinical concern.   Dg Chest 2 View  08/23/2015  CLINICAL DATA:  Altered mental status.  No bowel sounds. EXAM: CHEST  2 VIEW COMPARISON:  07/25/2015 FINDINGS: Shallow inspiration. Heart size and pulmonary vascularity are normal. There is developing infiltration or atelectasis in the left lung base. Pneumonia should be excluded. Nodular opacity in the left upper lung measures about 11 mm diameter. No nodules were demonstrated in this area on recent CT chest from 04/20/2015, suggesting that this may be inflammatory. Calcified and tortuous aorta. No blunting of costophrenic angles. No pneumothorax. Degenerative changes in the spine. Right central venous catheter with tip over the low SVC. IMPRESSION: Developing infiltration or atelectasis in the left lung base. Nodular opacity in the left upper lung is new since a previous chest CT of 04/20/2015 suggesting probable inflammatory etiology. Electronically Signed   By: Lucienne Capers M.D.   On: 08/23/2015 23:35   Dg Abd 1 View  08/23/2015  CLINICAL DATA:  Altered mental status.  No bowel sounds. EXAM: ABDOMEN - 1 VIEW COMPARISON:  CT abdomen and pelvis 07/13/2015 FINDINGS: Scattered gas and stool in the colon. No small or large bowel distention. Scattered surgical clips in the abdomen. Degenerative changes in the spine. No radiopaque stones. IMPRESSION: Nonobstructive bowel gas pattern. Electronically Signed   By: Lucienne Capers M.D.   On: 08/23/2015 23:33    EKG: Independently reviewed. Sinus tachycardia with rate 105.    Assessment/Plan 1. Sepsis:  This is new.  Suspected source bacteremia. Organism unknown.  Suspected CoNS, culture from [facility] not available. Patient meets criteria given tachycardia, tachypnea, leukocytosis, and evidence of organ dysfunction.  Blood cultures drawn.  Lactate normal.  MAP > 65 mmHg.   -Vancomycin and piperacillin-tazobactam, renally dosed -Fluids for  tachycardia -Consult to IR for removal of central line -Repeat blood cultures ordered, follow   2. Acidosis:  This is new.  Nephrology  have previous recommended the patient not be a dialysis candidate.   -Bicarbonate drip -Consult to nephrology, appreciate recommendations  3. AoCKI:  -Bicarbonate drip and bolus fluids  4. Transaminitis:  -CT abdomen is ordered -Trend LFT  5. Elevated troponin:  Stable.  -Serial troponins  6. Anemia:  Stable.  -Monitor CBC  7. Enterocutaneous fistula:  Stable.  -Continue octreotide and loperamide -TPN consult -WOC consult  8. IDDM: -Sliding scale corrections q4hrs       DVT PPx: Heparin Diet: TPN Consultants: Nephrology and [place] and Palliative Care and IR Code Status: DNR Family Communication: Spoke with daughter Tim Lair by phone.  CODE STATUS confirmed.  Medical decision making: What exists of the patient's previous chart was reviewed in depth and the case was discussed with Drs Jonnie Finner of nephrology and Roxanne Mins. Patient seen 4:30 AM on 08/24/2015.  Disposition Plan:  Admit to stepdown for profound acidosis and presumed sepsis.  Bicarbonate drip and consultation with nephrology and palliative care.  Prognosis guarded.      Edwin Dada [facility] Pager [phone]

## 2015-08-24 NOTE — Progress Notes (Signed)
Spoke with primary RN regarding a subcutaneus Morphine infusion versus attempting to get a PIV as she is a very difficult stick.  RN to discuss with MD.

## 2015-08-24 NOTE — Progress Notes (Signed)
Patient admitted after midnight. Discussed with Dr. Loleta Books. Chart reviewed. Patient examined. Patient is moaning, tachypnea, grimacing, encephalopathic. She has pus emanating from her PICC line insertion site, indicative of sepsis/bacteremia secondary to central line infection. She had staph epi bacteremia during last hospitalization, and ID recommended treating through the infection but not pulling the line.. Her renal failure is worse. She is not a dialysis candidate per previous nephrology opinion. Patient had previously been followed by palliative care. Her CODE STATUS is DO NOT RESUSCITATE. Prognosis appears very poor and patient is clearly suffering. Discussed with patient's daughter who requests patient be comfort care only, stop all tests, stop anything but comfort care.  Will attempt peripheral IV, but her daughter, difficult stick. If unable to obtain, will use current PICC line for comfort medications. Once comfortable, can transfer to the floor. Prognosis likely a few days to a week or 2. May disposition after more comfortable on morphine. May have in-hospital death.  Time 45 minutes  Doree Barthel, MD [facility] [email or web address]

## 2015-08-24 NOTE — Consult Note (Addendum)
WOC wound consult note Reason for Consult: Pt familiar to [place] from several previous admissions.  She has a chronic full thickness wound to the middle abd with a fistula.  Daughter at bedside and patient has been made comfort care at this time.  Current Eakin pouch in place with good seal, mod amt dark brown liquid drainage.   Dressing procedure/placement/frequency: Attached Eakin pouch to straight drainage bag.  If current pouch has leakage occur, then the bedside nurse can remove and apply gauze and ABD pads and tape to control drainage, since this is in alignment with comfort care goals.  Discussed plan of care with daughter, she is in agreement with this plan to minimize discomfort. Please re-consult if further assistance is needed.  Thank-you,  Julien Girt MSN, [name], [place], [place], [place]

## 2015-08-25 ENCOUNTER — Telehealth: Payer: Self-pay | Admitting: Internal Medicine

## 2015-08-25 DIAGNOSIS — I10 Essential (primary) hypertension: Secondary | ICD-10-CM

## 2015-08-25 DIAGNOSIS — E872 Acidosis: Secondary | ICD-10-CM

## 2015-08-25 DIAGNOSIS — N189 Chronic kidney disease, unspecified: Secondary | ICD-10-CM

## 2015-08-25 DIAGNOSIS — N179 Acute kidney failure, unspecified: Secondary | ICD-10-CM

## 2015-08-25 DIAGNOSIS — A419 Sepsis, unspecified organism: Secondary | ICD-10-CM

## 2015-08-25 DIAGNOSIS — R7881 Bacteremia: Secondary | ICD-10-CM

## 2015-08-25 MED ORDER — OCTREOTIDE ACETATE 100 MCG/ML IJ SOLN: 100.0000 ug | Freq: Two times a day (BID) | INTRAMUSCULAR | Status: AC

## 2015-08-25 MED ORDER — SODIUM CHLORIDE 0.9 % IV SOLN: 2.0000 mg/h | INTRAVENOUS | Status: AC

## 2015-08-26 ENCOUNTER — Telehealth: Payer: Self-pay | Admitting: Internal Medicine

## 2015-08-26 NOTE — Telephone Encounter (Signed)
Can you please pt's daughter Mariann Laster at [phone]. Pt has passed away and Mariann Laster was needed at the hospital and her work is needing something from her physician.

## 2015-08-26 NOTE — Telephone Encounter (Signed)
Hospice called stated that pt has passed   Date: 2023/09/09 Time: 10:36 pm

## 2015-08-27 LAB — CULTURE, BLOOD (ROUTINE X 2)

## 2015-08-27 NOTE — Telephone Encounter (Signed)
Called the # below and left a vm

## 2015-08-28 ENCOUNTER — Telehealth: Payer: Self-pay

## 2015-08-28 NOTE — Telephone Encounter (Signed)
On 08/28/2015 I received a death certificate from [employer] and [place] (faxed). The death certificate is for burial. The patient is a patient of Doctor [name]. The death certificate will be taken to [place] @ [place] this pm for signature. On 09/03/15 I received a death certificate back from Doctor [name]. I got the death certificate ready and called the funeral home to let them know the death certificate is ready and also faxed them a copy per their request.

## 2015-09-13 NOTE — Progress Notes (Signed)
Blood cultures positive, but pt is now comfort care. Will not treat lab results.

## 2015-09-13 NOTE — Progress Notes (Addendum)
Notified by Tomi Bamberger, CMRN of pt./family request for [facility] services at home after discharge. Chart and pt.information reviewed with Dr. Brigid Re  [facility] Medical Director and hospice eligibility confirmed.  Writer spoke with pts son, brother and daughter'n law at the bedside to initiate education related to hospice philosophy, services and team approach to care. Family voiced understanding of information provided. Family has requested that a foley catheter be placed prior to discharge and staff RN aware. Per discussion plan is for discharge to  Home by PTAR later today.  Please send signed completed DNR form home with pt./family. Pt. will need prescriptions for discharge comfort medications.  DME needs discussed and family requests a bed, table, suction and O2 at 2L [place] to be delivered to the house today. [facility] equipment manager Jewel Ysidro Evert notified and will contact [place] to arrange delivery to the home. The home address has been verified and pt's daughter Randell Patient is the contact person to be called to receive the equipment. Family has been asked to notify [facility] when pt. Leaves the unit at discharge at [phone]. [facility]  Information and contact numbers have been given to the family at the visit and to [facility] by phone. Above information shared with Neoma Laming, [facility] Please call with any questions.  [facility] Liaison [phone]   Addendum: Carolynn Sayers RN with [facility] hun a  new Morphine drip and hooked it up to a CADD pump for discharge.  [facility] RN will be out to pt's home after discharge this evening to f/u with pt. and her family.

## 2015-09-13 NOTE — Telephone Encounter (Signed)
Last OV 04/15/15 please advise

## 2015-09-13 NOTE — Progress Notes (Signed)
Notified DR Ree Kida of [facility] results called  Up per lab gram neg rods and she is already aware. Pt is d/cing to home w/ hospice

## 2015-09-13 NOTE — Telephone Encounter (Signed)
Betsy from Hospice states pt will be leaving the hospital and be sent home with hospice and she is wanting to know if you will be the attending physician  She can be reached at [phone]

## 2015-09-13 NOTE — Progress Notes (Signed)
Lab notified RN and reported that pt had positive blood cultures that grew gram positive cocci and clusters. On-call MD, Baltazar Najjar, notified. Will continue to monitor and treat per MD orders.

## 2015-09-13 NOTE — Care Management Note (Addendum)
Case Management Note  Patient Details  Name: Marissa [name] MRN: [id number] Date of Birth: September 24, 1940  Subjective/Objective:    Date: 09-15-2015 Spoke with patient at the bedside along with Gray Bernhardt and Randell Patient was on the phone [facility]), they have decided to take patient home with home hospice , Hospice and [place], [place] made referral to [place].  Introduced self as Tourist information centre manager and explained role in discharge planning and how to be reached.  Verified patient lives in town, with daughter. Patient will need hospital bed , and they would like for her to go home with a foley catheter as well.  The onsite liaison with [facility] will speak with family about other DME they may need.  Verified family wants patient  to go home with home hospice, will need ambulance transport at time of discharge.  Patient  denied needing help with their medication.   Verified patient has PCP Dr.  [name].  Pam the onsite liaison with [facility] will set up morphine gtt in patient's room on [place] pump before she leaves.  RN will call ptar as soon as she hears from the family that the dme is at the home.   Plan: CM will continue to follow for discharge planning and [facility] resources.                 Action/Plan:   Expected Discharge Date:                  Expected Discharge Plan:  Home w Hospice Care  In-House Referral:  Clinical Social Work  Discharge planning Services  CM Consult  Post Acute Care Choice:    Choice offered to:  Adult Children, HC POA / Guardian  DME Arranged:    DME Agency:     HH Arranged:  RN [place] Agency:  [facility]  Status of Service:  Completed, signed off  Medicare Important Message Given:    Date Medicare IM Given:    Medicare IM give by:    Date Additional Medicare IM Given:    Additional Medicare Important Message give by:     If discussed at [place] of Stay Meetings, dates discussed:    Additional Comments:  Zenon Mayo,  RN Sep 15, 2015, 11:27 AM

## 2015-09-13 NOTE — Clinical Documentation Improvement (Signed)
Internal Medicine  Can the diagnosis of anemia be further specified?  Thank you   Iron deficiency Anemia  Nutritional anemia, including the nutrition or mineral deficits  Chronic Anemia, including the suspected or known cause  Anemia of chronic disease, including the associated chronic disease state  Other  Clinically Undetermined  Document any associated diagnoses/conditions.   Supporting Information:  Hemoglobin 12.0 - 15.0 g/dL 7.6 (L) 9.0 (L)         HCT 36.0 - 46.0 % 23.2 (L) 27.1                Please exercise your independent, professional judgment when responding. A specific answer is not anticipated or expected.   Thank You,  Belva [phone]

## 2015-09-13 NOTE — Clinical Documentation Improvement (Signed)
  Internal Medicine  Can the diagnosis of  renal failure be further specified with an acuity? Thank you    Acute   Chronic   Other  Clinically Undetermined  Document any associated diagnoses/conditions.     Please exercise your independent, professional judgment when responding. A specific answer is not anticipated or expected.   Thank You,  [place] [phone]

## 2015-09-13 NOTE — Telephone Encounter (Signed)
yes

## 2015-09-13 NOTE — Clinical Documentation Improvement (Signed)
Internal Medicine  Can the diagnosis of " AoCKI" be further specified? Thank you    CKD Stage I - GFR greater than or equal to 90  CKD Stage II - GFR 60-89  CKD Stage III - GFR 30-59  CKD Stage IV - GFR 15-29  CKD Stage V - GFR < 15   Other condition  Unable to clinically determine    Please exercise your independent, professional judgment when responding. A specific answer is not anticipated or expected.   Thank You, [place] [phone]

## 2015-09-13 NOTE — Progress Notes (Signed)
Called to [facility]  Morphine home pump in place  Ready for d/c

## 2015-09-13 NOTE — Progress Notes (Signed)
Wasted 211 ml morphine gtt wittnessed by Lance Sell RN

## 2015-09-13 NOTE — Progress Notes (Signed)
CSW consulted for [facility]idential [facility] placement. Bed unavailable at [facility] [place] so family now taking patient home w/ [facility].  CSW signing off.  Percell Locus Megen Madewell LCSWA [phone]

## 2015-09-13 NOTE — Discharge Summary (Signed)
Physician Discharge Summary  Marissa [name] [id number] DOB: 11/11/1940 DOA: 08/23/2015  PCP: [name], MD  Admit date: 08/23/2015 Discharge date: Sep 04, 2015  Time spent: 45 minutes  Recommendations for Outpatient Follow-up:  Patient will be discharged to home with hospice/comfort care.   Discharge Diagnoses:  Sepsis secondary to bacteremia Acute on chronic kidney failure Elevated troponin Anemia Transaminitis  Discharge Condition: Stable  Diet recommendation: None  Filed Weights   08/24/15 0500  Weight: 91 kg (200 lb 9.9 oz)    History of present illness:  On 08/24/2015 by Dr. Myrene Buddy Luceille Rutherford is a 75 y.o. 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  History is collected from EDP and from on-call RN at the patient's facility. Facility reports that the patient had been stable since discharge on 11/22 until about two days ago when a blood culture grew GPC in clusters. She was started on ceftriaxone the next day. Today, the patient was noted to be having increased respiratory effort, and so she was transported to the ED.  In the ED, the patient had a low grade fever, tachycardia, kussmaul respirations. Her BP was normal, her WBC was 32K/uL, lactic acid was normal, abdomen was painful, and bicarbonate was 9 mg/dL and serum creatinine was up to 4.29 mg/dl from 3.62 mg/dL at her last discharge. Vancomycin and piperacillin-tazobactam were administered and TR[facility] were asked to admit for presumed bacteremia sepsis given recent positive blood culture.  Hospital Course:  Patient to have staph bacteremia during last hospitalization due to PICC line insertion.  Patient admitted for sepsis secondary to central line infection. This admission, blood culture showed to have GPC and GNR growing. Infectious disease recommended treating infection and not pulling  the line. Upon admission, patient's renal failure was worse, she is nondialysis candidate. Patient was a DO NOT RESUSCITATE. Patient's prognosis was found to be poor. Patient was transitioned to comfort care by the previous hospitalist. She has been on a morphine drip for comfort. Patient will be discharged to home with morphine for comfort to be managed and followed by hospice.   Procedures:  None  Consultations:  None  Discharge Exam: Filed Vitals:   08/24/15 2134 09-04-15 0434  BP: 107/54 99/53  Pulse: 115 116  Temp: 98.2 F (36.8 C) 98.3 F (36.8 C)  Resp: 19 15     General: Well developed, well nourished  Cardiovascular: S1 S2 auscultated, no rubs, murmurs or gallops. Tachycardic  Respiratory: Increased work of breathing.  Discharge Instructions     Medication List    STOP taking these medications        acetaminophen 325 MG tablet  Commonly known as:  TYLENOL     acetaminophen 500 MG tablet  Commonly known as:  TYLENOL     cyanocobalamin 2000 MCG tablet     diphenhydrAMINE 25 mg capsule  Commonly known as:  BENADRYL     escitalopram 10 MG tablet  Commonly known as:  LEXAPRO     hydrOXYzine 10 MG tablet  Commonly known as:  ATARAX/VISTARIL     insulin aspart 100 UNIT/ML injection  Commonly known as:  novoLOG     loperamide 2 MG capsule  Commonly known as:  IMODIUM     methocarbamol 500 MG tablet  Commonly known as:  ROBAXIN     modafinil 100 MG tablet  Commonly known as:  PROVIGIL     multivitamin Liqd  NITROSTAT 0.4 MG SL tablet  Generic drug:  nitroGLYCERIN     nystatin 100000 UNIT/GM Powd     nystatin 100000 UNIT/ML suspension  Commonly known as:  MYCOSTATIN     oxycodone 5 MG capsule  Commonly known as:  OXY-IR     pantoprazole 40 MG tablet  Commonly known as:  PROTONIX     TPN ADULT     UNABLE TO FIND      TAKE these medications        morphine 250 mg in sodium chloride 0.9 % 240 mL  Inject 2 mg/hr into the vein  continuous.     octreotide 100 MCG/ML Soln injection  Commonly known as:  SANDOSTATIN  Inject 1 mL (100 mcg total) into the skin 2 (two) times daily.       No Known Allergies Follow-up Information    Follow up with Hospice at [facility].   Specialty:  Hospice and Palliative Medicine   Why:  home hospice   Contact information:   [place] [id number] [phone]        The results of significant diagnostics from this hospitalization (including imaging, microbiology, ancillary and laboratory) are listed below for reference.    Significant Diagnostic Studies: Ct Abdomen Wo Contrast  Aug 26, 2015  CLINICAL DATA:  Acute onset of decreased level of consciousness. Urinary tract infection. Severe leukocytosis. Assess for pneumonia, given opacity on chest radiograph. Initial encounter. EXAM: CT ABDOMEN WITHOUT CONTRAST TECHNIQUE: Multidetector CT imaging of the abdomen was performed following the standard protocol without IV contrast. COMPARISON:  Chest radiograph performed 08/23/2015, and CT of the abdomen and pelvis performed 07/13/2015 FINDINGS: Mild left basilar airspace opacity may reflect atelectasis or pneumonia, though opacity along the medial left lung base is more suggestive of pneumonia given clinical concern. The liver and spleen are unremarkable in appearance. The patient is status post cholecystectomy, with clips noted at the gallbladder fossa. The pancreas and adrenal glands are unremarkable. The kidneys are unremarkable in appearance. There is no evidence of hydronephrosis. No renal or ureteral stones are seen. No perinephric stranding is appreciated. No free fluid is identified. Visualized small and large bowel loops are unremarkable in appearance, aside from mild diverticulosis along the proximal sigmoid colon. A high-density focus along the proximal sigmoid colon is nonspecific and may simply reflect contrast. The stomach is within normal limits. No acute  vascular abnormalities are seen. Mild scattered calcification is noted along the abdominal aorta and its branches. No acute osseous abnormalities are identified. Facet disease is noted along the lumbar spine. IMPRESSION: Mild left basilar airspace opacity may reflect atelectasis or pneumonia, though opacity along the medial left lung base is more suggestive of pneumonia given clinical concern. Electronically Signed   By: Garald Balding M.D.   On: 08-26-15 04:13   Dg Chest 2 View  08/23/2015  CLINICAL DATA:  Altered mental status.  No bowel sounds. EXAM: CHEST  2 VIEW COMPARISON:  07/25/2015 FINDINGS: Shallow inspiration. Heart size and pulmonary vascularity are normal. There is developing infiltration or atelectasis in the left lung base. Pneumonia should be excluded. Nodular opacity in the left upper lung measures about 11 mm diameter. No nodules were demonstrated in this area on recent CT chest from 04/20/2015, suggesting that this may be inflammatory. Calcified and tortuous aorta. No blunting of costophrenic angles. No pneumothorax. Degenerative changes in the spine. Right central venous catheter with tip over the low SVC. IMPRESSION: Developing infiltration or atelectasis in the left lung base.  Nodular opacity in the left upper lung is new since a previous chest CT of 04/20/2015 suggesting probable inflammatory etiology. Electronically Signed   By: Lucienne Capers M.D.   On: 08/23/2015 23:35   Dg Abd 1 View  08/23/2015  CLINICAL DATA:  Altered mental status.  No bowel sounds. EXAM: ABDOMEN - 1 VIEW COMPARISON:  CT abdomen and pelvis 07/13/2015 FINDINGS: Scattered gas and stool in the colon. No small or large bowel distention. Scattered surgical clips in the abdomen. Degenerative changes in the spine. No radiopaque stones. IMPRESSION: Nonobstructive bowel gas pattern. Electronically Signed   By: Lucienne Capers M.D.   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 Patient also has chronic kidney disease. Patient needs IV access for TPN and antibiotics. EXAM: FLUOROSCOPIC AND ULTRASOUND GUIDED PLACEMENT OF A TUNNELED CENTRAL VENOUS CATHETER Physician: Stephan Minister. Henn, MD FLUOROSCOPY TIME:  54 seconds, 23 mGy MEDICATIONS: 1 mg Versed, 25 mcg fentanyl. A radiology nurse monitored the patient for moderate sedation. ANESTHESIA/SEDATION: Moderate sedation time: 20 minutes PROCEDURE: Informed consent was obtained for placement of a tunneled central venous catheter. The patient was placed supine on the interventional table. Ultrasound confirmed a patent right internal jugularvein. Ultrasound images were obtained for documentation. The right side of the neck was prepped and draped in a sterile fashion. The right side of the neck was anesthetized with 1% lidocaine. Maximal barrier sterile technique was utilized including caps, mask, sterile gowns, sterile gloves, sterile drape, hand hygiene and skin antiseptic. A small incision was made with #11 blade scalpel. A 21 gauge needle directed into the right internal jugular vein with ultrasound guidance. A micropuncture dilator set was placed. A dual lumen Powerline catheter was selected. The skin below the right clavicle was anesthetized and a small incision was made with an #11 blade scalpel. A subcutaneous tunnel was formed to the vein dermatotomy site. The catheter was brought through the tunnel and cut to 22 cm. The vein dermatotomy site was dilated to accommodate a peel-away sheath. The catheter was placed through the peel-away sheath and directed into the central venous structures. The tip of the catheter was placed at the superior cavoatrial junction with fluoroscopy. Fluoroscopic images were obtained for documentation. Both lumens were found to aspirate and flush well. The vein dermatotomy site was closed using a single layer of absorbable suture and Dermabond. The catheter was secured to the  skin using Prolene suture. FINDINGS: Catheter tip at the superior cavoatrial junction. Estimated blood loss: Minimal COMPLICATIONS: None IMPRESSION: Successful placement of a right jugular tunneled central venous catheter using ultrasound and fluoroscopic guidance. Electronically Signed   By: Markus Daft M.D.   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 Patient also has chronic kidney disease. Patient needs IV access for TPN and antibiotics. EXAM: FLUOROSCOPIC AND ULTRASOUND GUIDED PLACEMENT OF A TUNNELED CENTRAL VENOUS CATHETER Physician: Stephan Minister. Henn, MD FLUOROSCOPY TIME:  54 seconds, 23 mGy MEDICATIONS: 1 mg Versed, 25 mcg fentanyl. A radiology nurse monitored the patient for moderate sedation. ANESTHESIA/SEDATION: Moderate sedation time: 20 minutes PROCEDURE: Informed consent was obtained for placement of a tunneled central venous catheter. The patient was placed supine on the interventional table. Ultrasound confirmed a patent right internal jugularvein. Ultrasound images were obtained for documentation. The right side of the neck was prepped and draped in a sterile fashion. The right side of  the neck was anesthetized with 1% lidocaine. Maximal barrier sterile technique was utilized including caps, mask, sterile gowns, sterile gloves, sterile drape, hand hygiene and skin antiseptic. A small incision was made with #11 blade scalpel. A 21 gauge needle directed into the right internal jugular vein with ultrasound guidance. A micropuncture dilator set was placed. A dual lumen Powerline catheter was selected. The skin below the right clavicle was anesthetized and a small incision was made with an #11 blade scalpel. A subcutaneous tunnel was formed to the vein dermatotomy site. The catheter was brought through the tunnel and cut to 22 cm. The vein dermatotomy site was dilated to accommodate a peel-away sheath. The catheter was placed  through the peel-away sheath and directed into the central venous structures. The tip of the catheter was placed at the superior cavoatrial junction with fluoroscopy. Fluoroscopic images were obtained for documentation. Both lumens were found to aspirate and flush well. The vein dermatotomy site was closed using a single layer of absorbable suture and Dermabond. The catheter was secured to the skin using Prolene suture. FINDINGS: Catheter tip at the superior cavoatrial junction. Estimated blood loss: Minimal COMPLICATIONS: None IMPRESSION: Successful placement of a right jugular tunneled central venous catheter using ultrasound and fluoroscopic guidance. Electronically Signed   By: Markus Daft M.D.   On: 07/27/2015 17:40    Microbiology: Recent Results (from the past 240 hour(s))  Culture, blood (routine x 2)     Status: None (Preliminary result)   Collection Time: 08/24/15 12:35 AM  Result Value Ref Range Status   Specimen Description BLOOD RIGHT PICC LINE  Final   Special Requests BOTTLES DRAWN AEROBIC AND ANAEROBIC 5CC   Final   Culture  Setup Time   Final    GRAM POSITIVE COCCI IN CLUSTERS IN BOTH AEROBIC AND ANAEROBIC BOTTLES CRITICAL RESULT CALLED TO, READ BACK BY AND VERIFIED WITH: K COBOE RN @2225  08/24/15 MKELLY    Culture   Final    GRAM POSITIVE COCCI GRAM NEGATIVE RODS CRITICAL RESULT CALLED TO, READ BACK BY AND VERIFIED WITH: A JOHNSON 08-28-15 @ 79 M VESTAL    Report Status PENDING  Incomplete  MRSA PCR Screening     Status: Abnormal   Collection Time: 08/24/15  5:12 AM  Result Value Ref Range Status   MRSA by PCR POSITIVE (A) NEGATIVE Final    Comment:        The GeneXpert MRSA Assay (FDA approved for NASAL specimens only), is one component of a comprehensive MRSA colonization surveillance program. It is not intended to diagnose MRSA infection nor to guide or monitor treatment for MRSA infections. RESULT CALLED TO, READ BACK BY AND VERIFIED WITH:  A. [facility] RN AT  [id number] 08/24/15 BY A. DAVIS      Labs: Basic Metabolic Panel:  Recent Labs Lab 08/24/15 0025 08/24/15 0701  NA 138 142  K 5.1 4.2  CL 117* 115*  CO2 9* 15*  GLUCOSE 188* 397*  BUN 227* 201*  CREATININE 4.29* 4.01*  CALCIUM 10.4* 9.0  MG 2.8*  --   PHOS 2.8  --    Liver Function Tests:  Recent Labs Lab 08/24/15 0025 08/24/15 0701  AST 77* 71*  ALT 155* 132*  ALKPHOS 275* 235*  BILITOT 1.2 1.0  PROT 9.9* 7.7  ALBUMIN 2.3* 1.8*   No results for input(s): LIPASE, AMYLASE in the last 168 hours. No results for input(s): AMMONIA in the last 168 hours. CBC:  Recent Labs Lab 08/24/15 0025 08/24/15  0701  WBC 32.3* 21.0*  NEUTROABS 29.4*  --   HGB 9.0* 7.6*  HCT 27.1* 23.2*  MCV 72.5* 71.6*  PLT 242 203   Cardiac Enzymes:  Recent Labs Lab 08/24/15 0701  TROPONINI 0.06*   BNP: BNP (last 3 results)  Recent Labs  11/02/14 2340 03/19/15 1325 08/24/15 0025  BNP 109.2* 141.6* 81.3    ProBNP (last 3 results) No results for input(s): PROBNP in the last 8760 hours.  CBG:  Recent Labs Lab 08/24/15 0722  GLUCAP 217*       Signed:  Cristal Ford  [facility] Sep 20, 2015, 1:06 PM

## 2015-09-13 DEATH — deceased

## 2016-03-26 IMAGING — CT CT HEAD W/O CM
2 series · 15 of 30 positions shown, 17 images · non-contrast
Comparison: 12/28/2014

CLINICAL DATA: Abnormal speech.

EXAM:
CT HEAD WITHOUT CONTRAST
TECHNIQUE: Contiguous axial images were obtained from the base of the skull
through the vertex without intravenous contrast.

[Series 2: head without · axial · non-contrast · 0.48mm/px · z∈[-92,+28]mm · 7 of 34 slices shown, 9 images]
[im 5/34  brain]
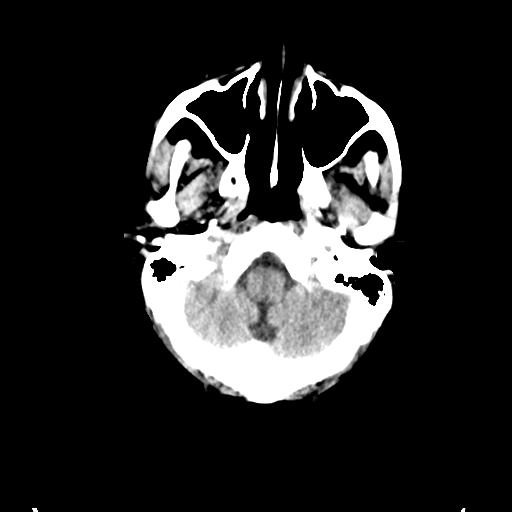
[im 5/34  bone]
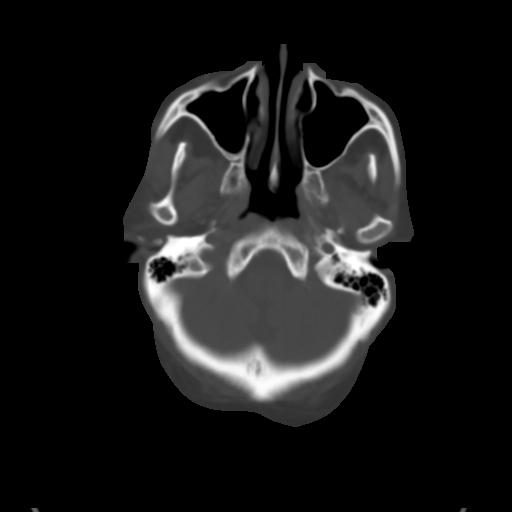
[im 9/34  brain]
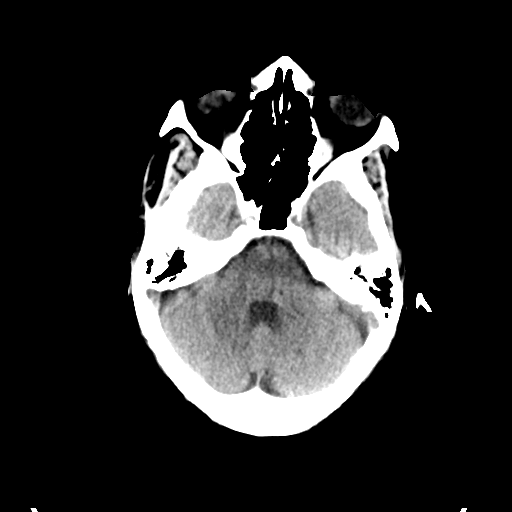
[im 13/34  brain]
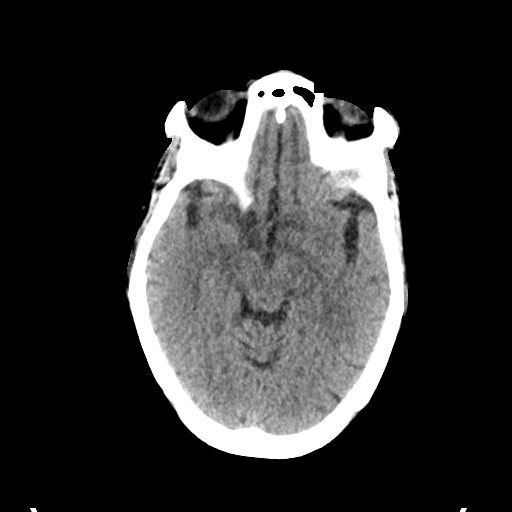
[im 17/34  brain]
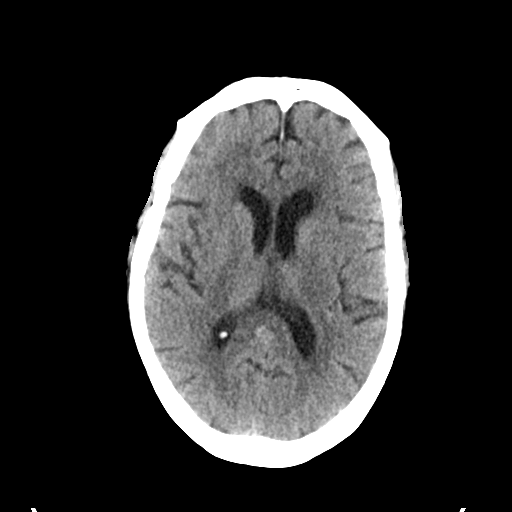
[im 21/34  brain]
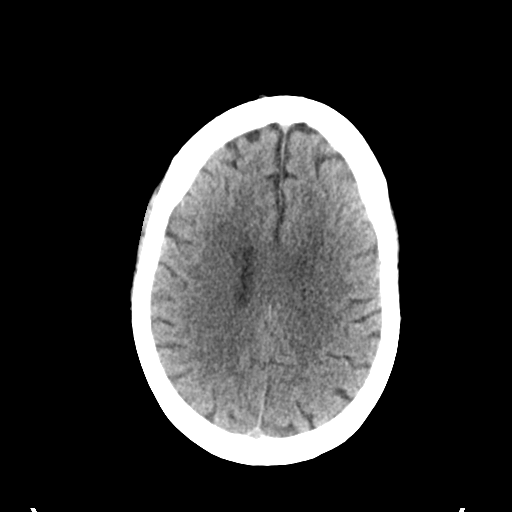
[im 21/34  bone]
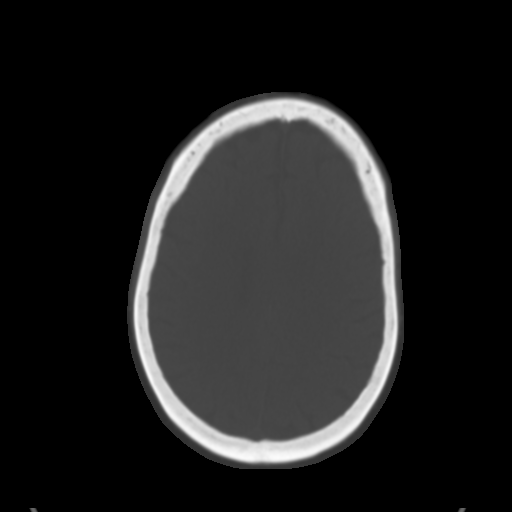
[im 25/34  brain]
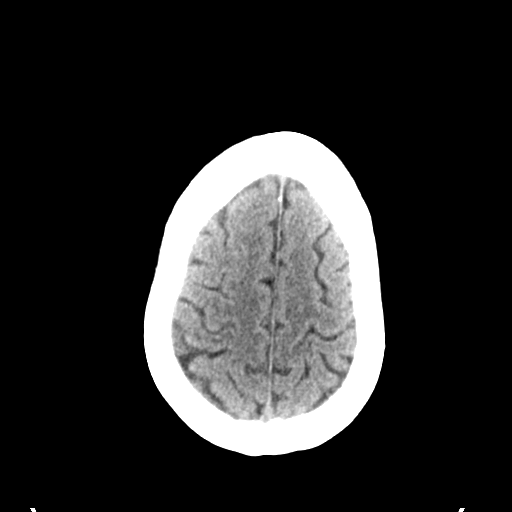
[im 29/34  brain]
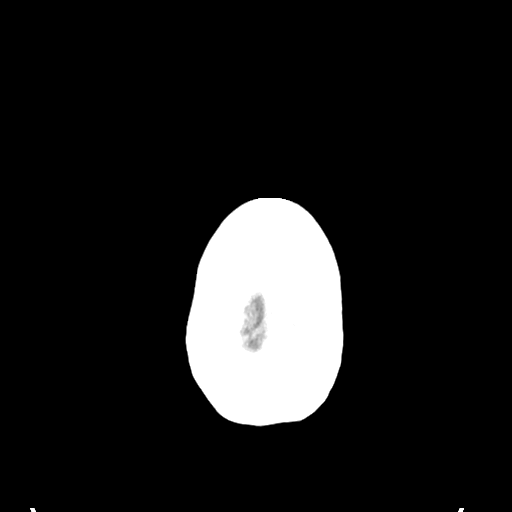

[Series 3: head bone · axial · 0.48mm/px · z∈[-96,+38]mm · 8 of 85 slices shown]
[im 9/85  bone]
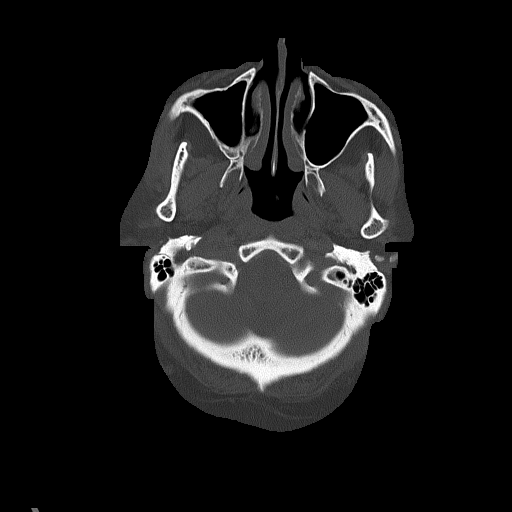
[im 17/85  bone]
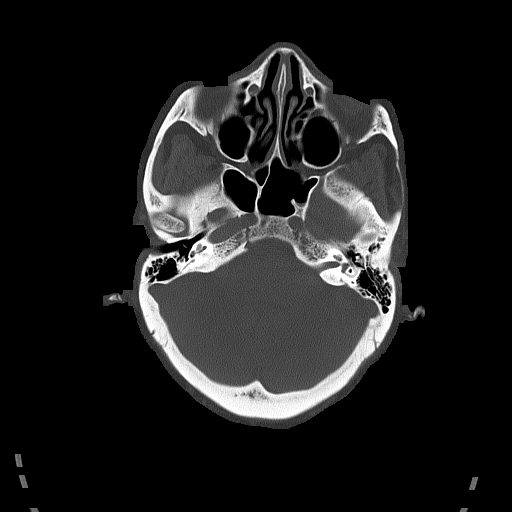
[im 26/85  bone]
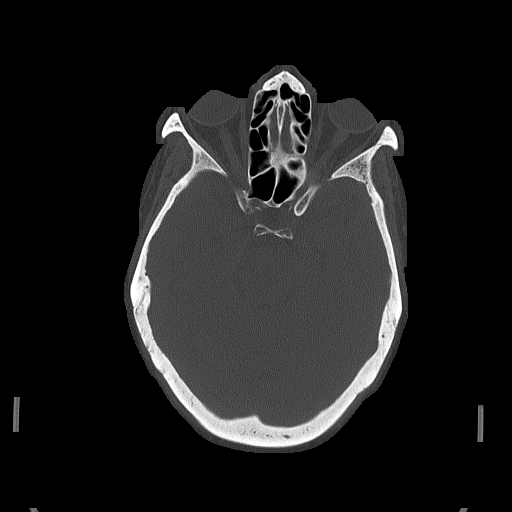
[im 38/85  bone]
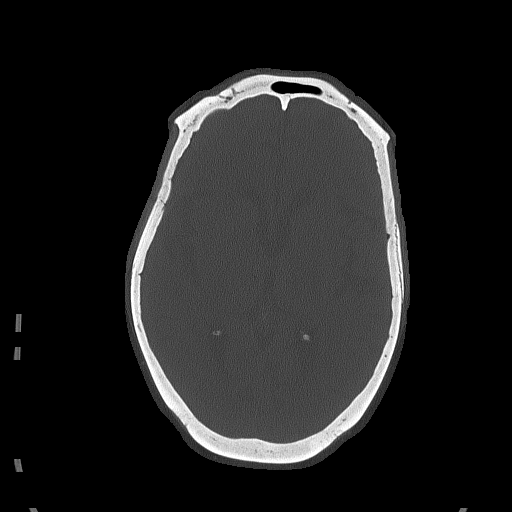
[im 47/85  bone]
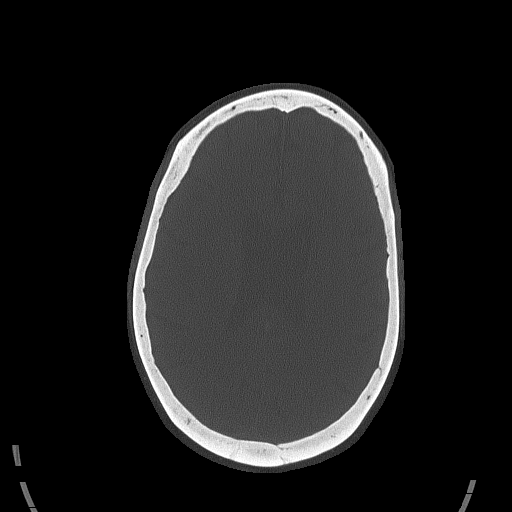
[im 59/85  bone]
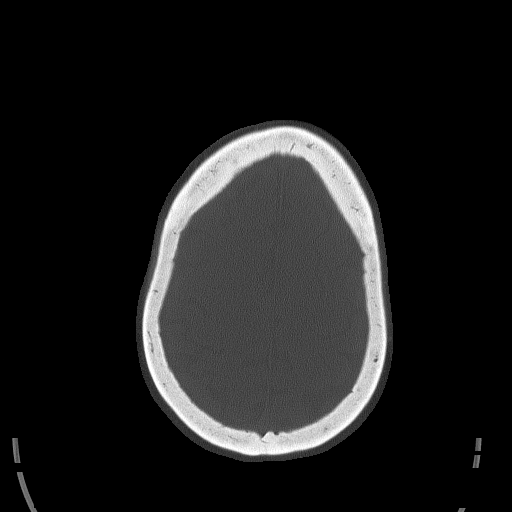
[im 68/85  bone]
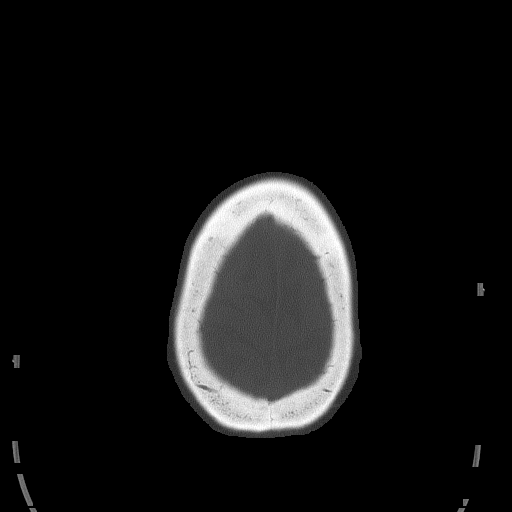
[im 76/85  bone]
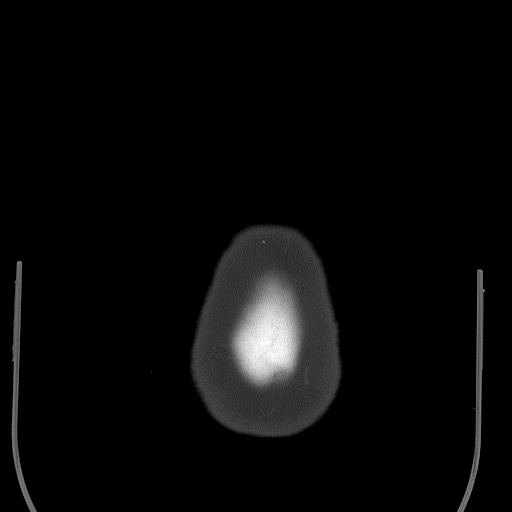

[15 of 30 positions shown; findings below may reference images not displayed]

FINDINGS: Mild generalized cerebral atrophy is normal for age. There is no
evidence of acute cortical infarct, intracranial hemorrhage, mass,
midline shift, or extra-axial fluid collection. Periventricular
white-matter hypodensities are unchanged and nonspecific but
compatible with mild chronic small vessel ischemic disease.

Orbits are unremarkable. Visualized paranasal sinuses and mastoid
air cells are clear. Mild atherosclerotic calcification is noted at
the skullbase.
IMPRESSION: No evidence of acute intracranial abnormality.

## 2016-05-02 IMAGING — CR DG CHEST 2V
2 series · 2 of 2 positions shown · non-contrast
Comparison: 06/04/2015 chest radiograph

CLINICAL DATA: Emesis.  CHF.

EXAM:
CHEST  2 VIEW

[chest lat]
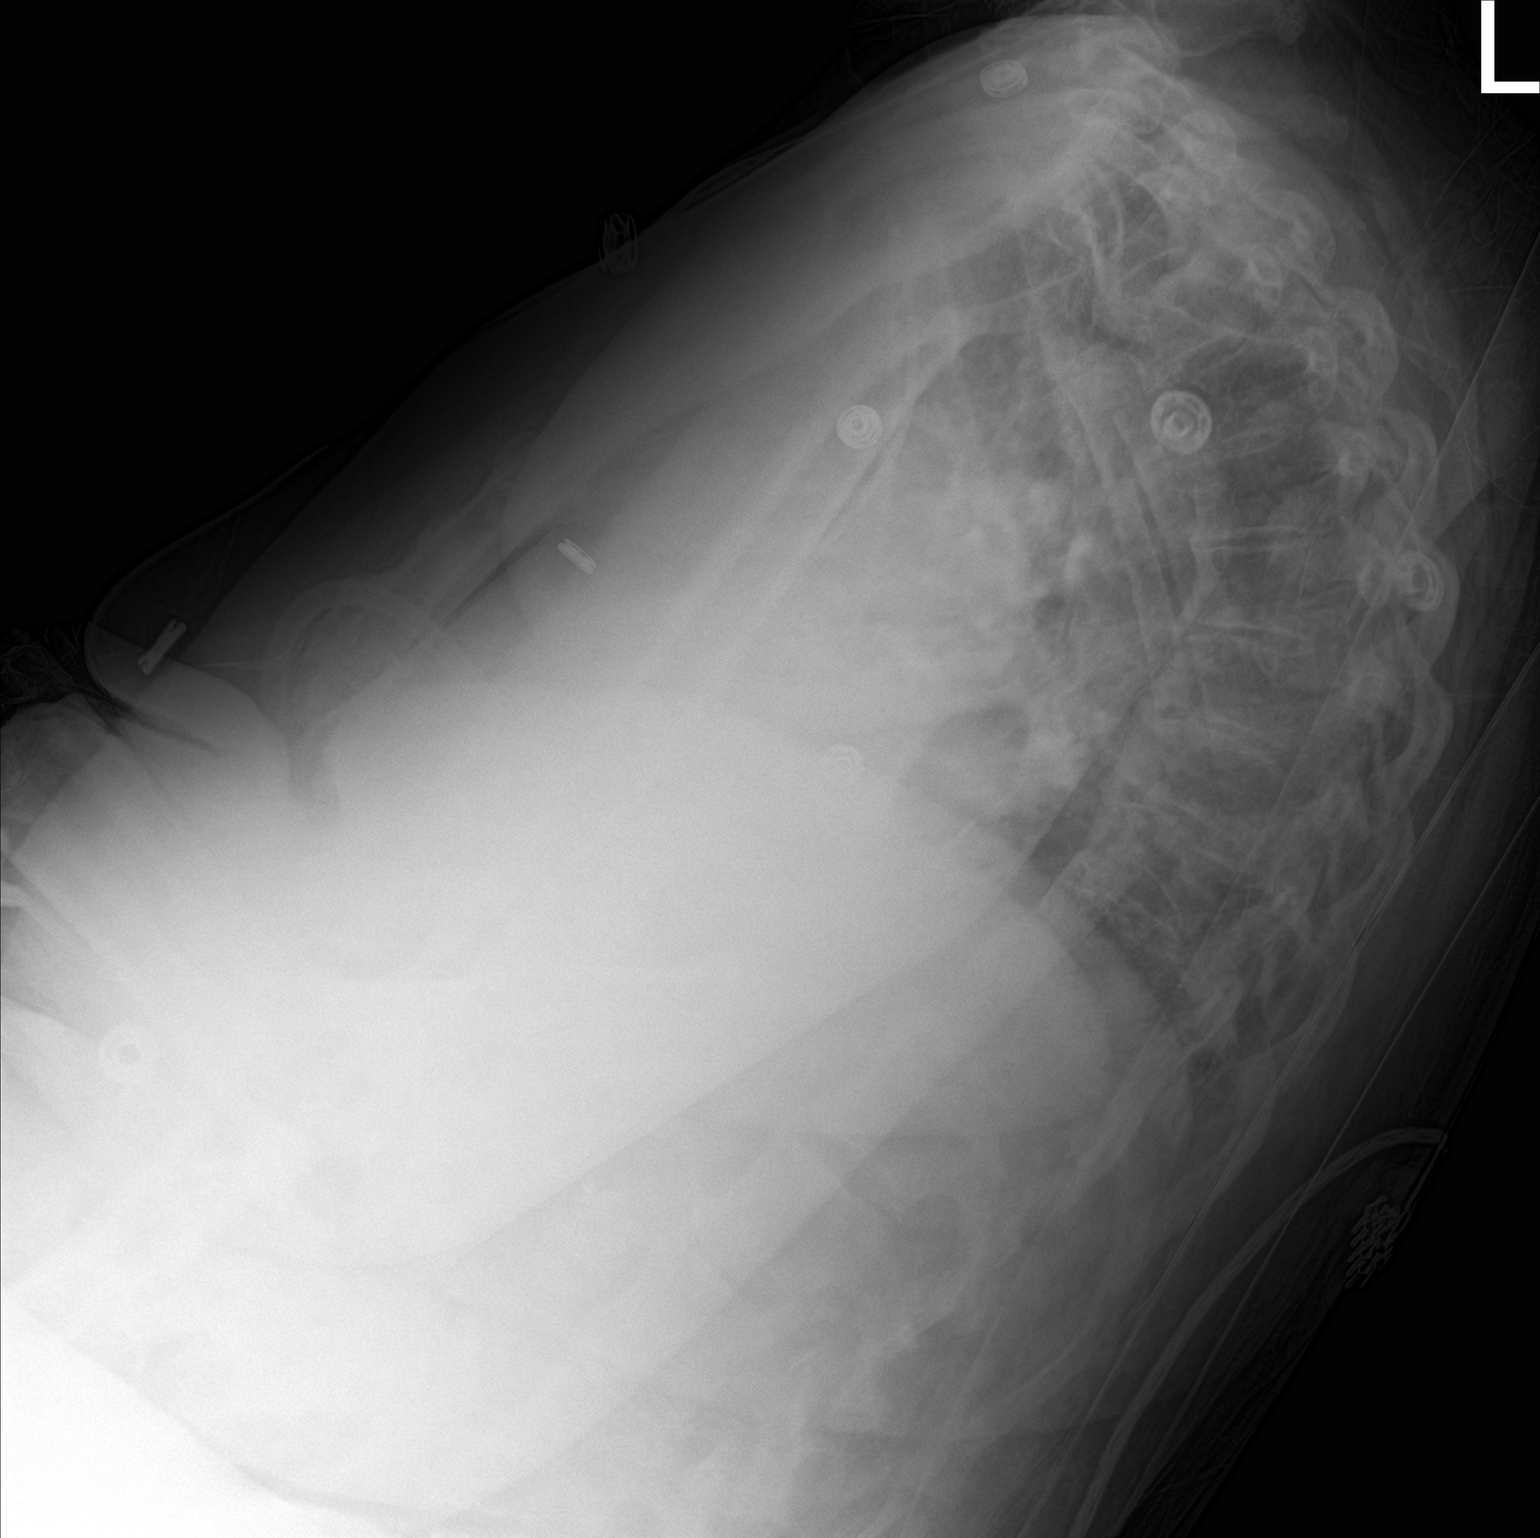

[chest ap]
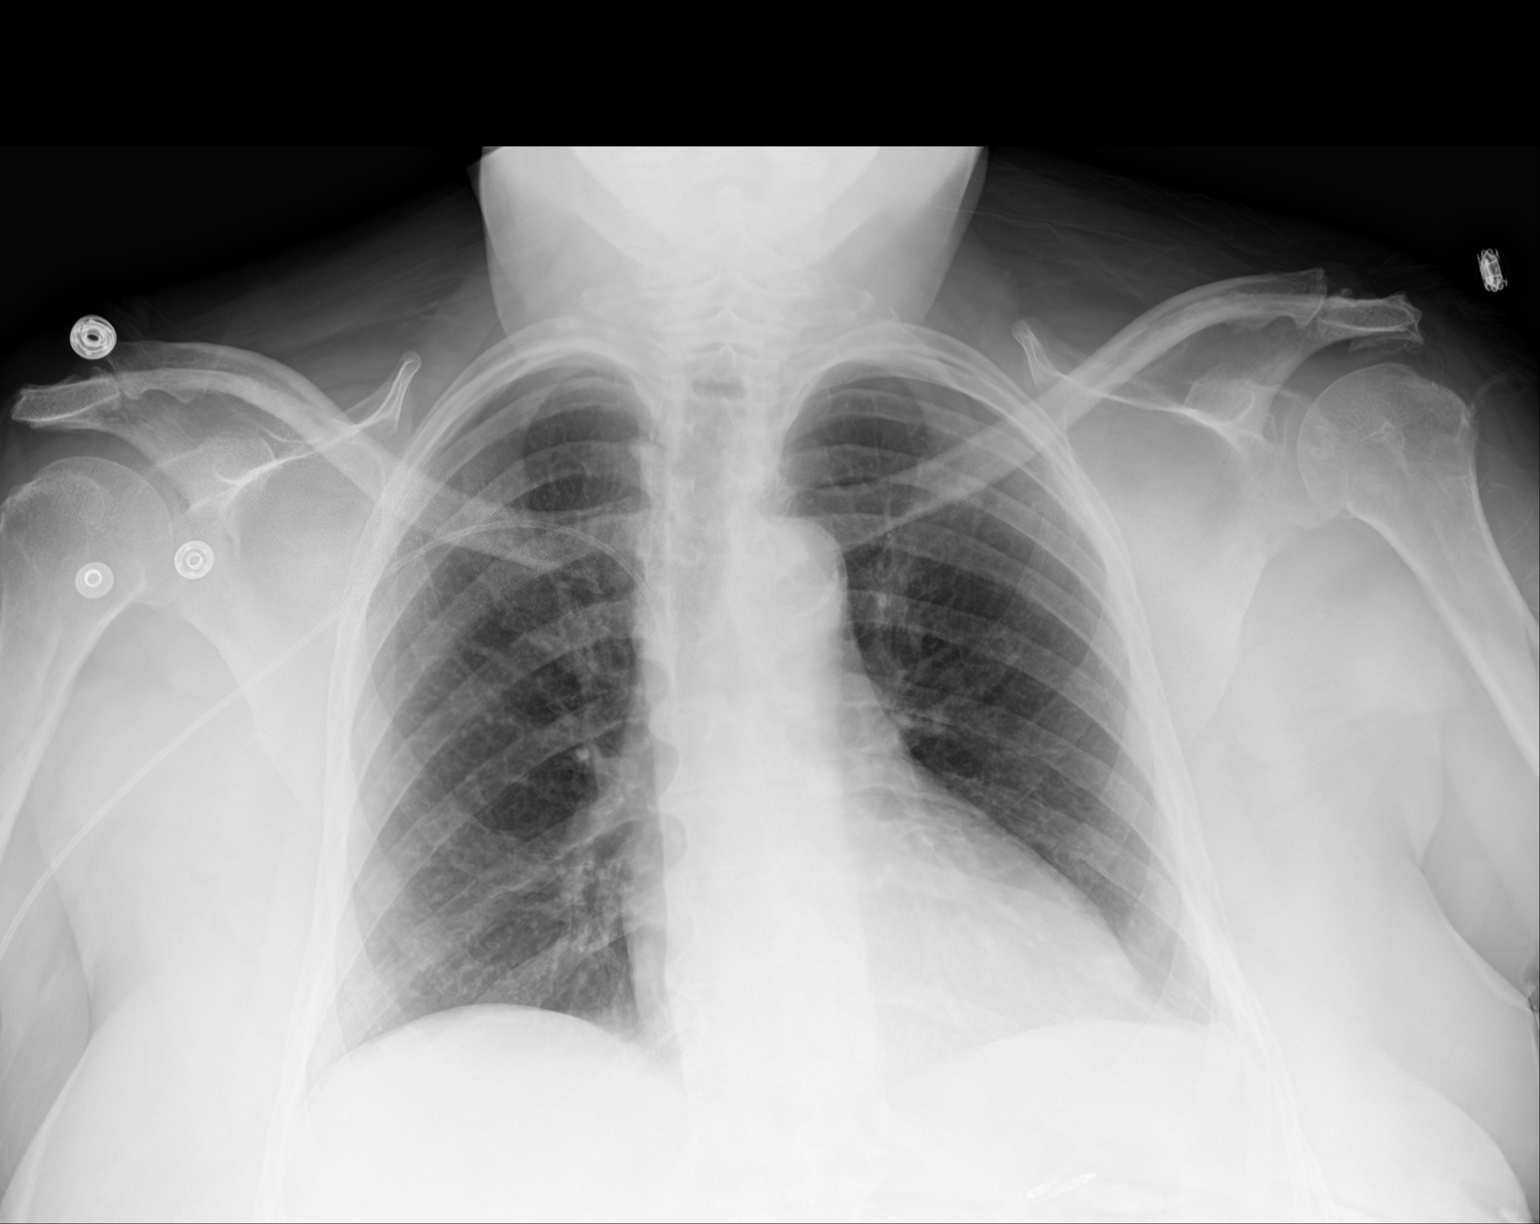

[2 of 2 positions shown; findings below may reference images not displayed]

FINDINGS: Right PICC terminates in the lower third of the superior vena cava.
Stable cardiomediastinal silhouette with mild cardiomegaly. No
pneumothorax. No pleural effusion. Clear lungs, with no focal lung
consolidation and no pulmonary edema.
IMPRESSION: Stable mild cardiomegaly without pulmonary edema. Lungs appear
clear.

## 2016-10-04 IMAGING — US US RENAL
1 series · 14 of 25 positions shown · non-contrast
Comparison: CT from 4 days ago

CLINICAL DATA: Elevated creatinine

EXAM:
RENAL / URINARY TRACT ULTRASOUND COMPLETE

[Series 1: us renal · 0.21mm/px · 14 of 27 slices shown]
[im 1/27]
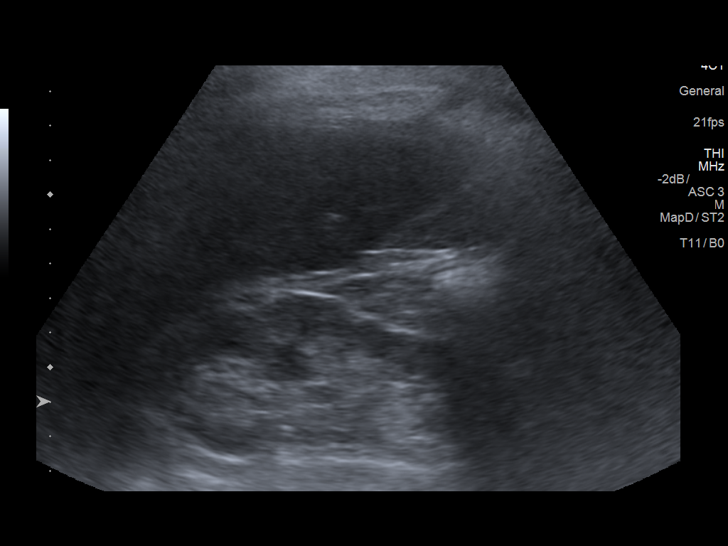
[im 3/27]
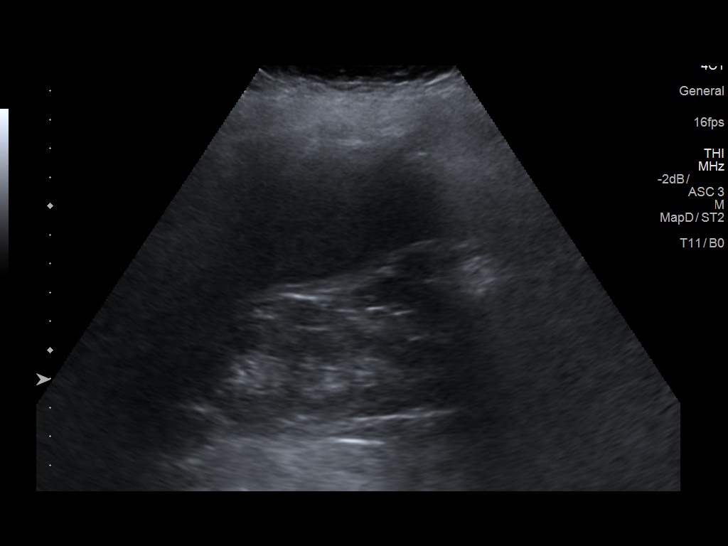
[im 5/27]
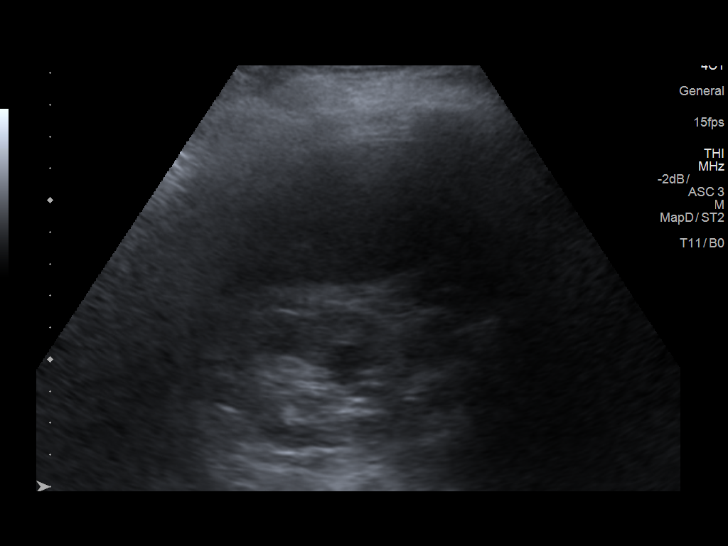
[im 7/27]
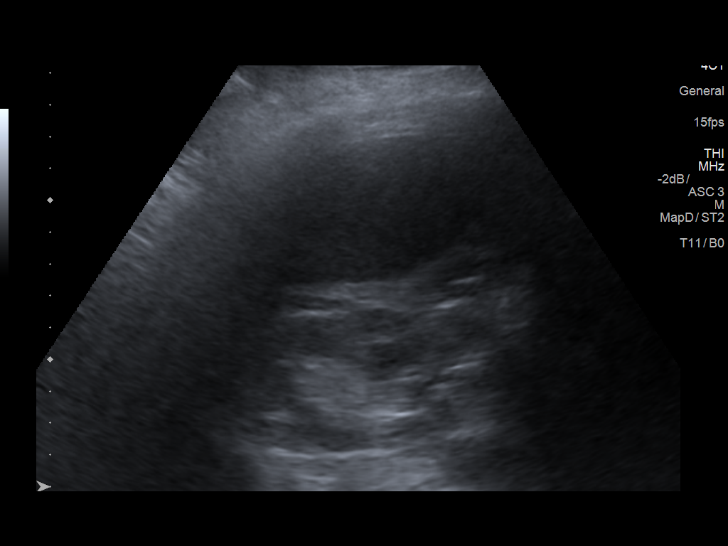
[im 9/27]
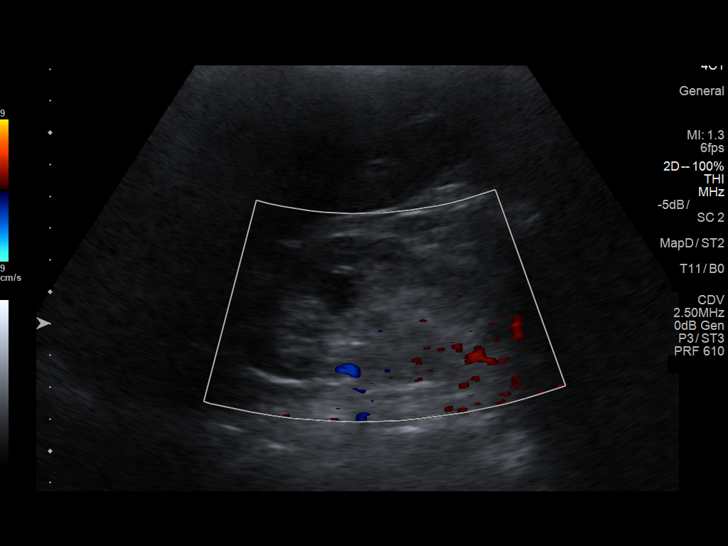
[im 10/27]
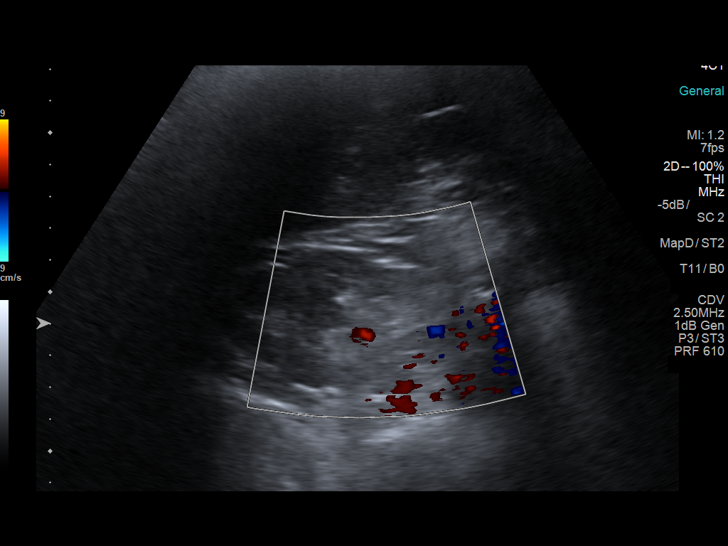
[im 12/27]
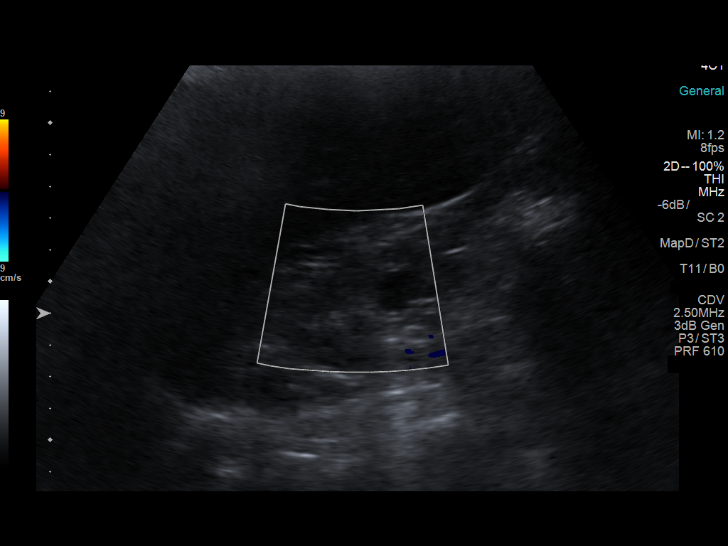
[im 15/27]
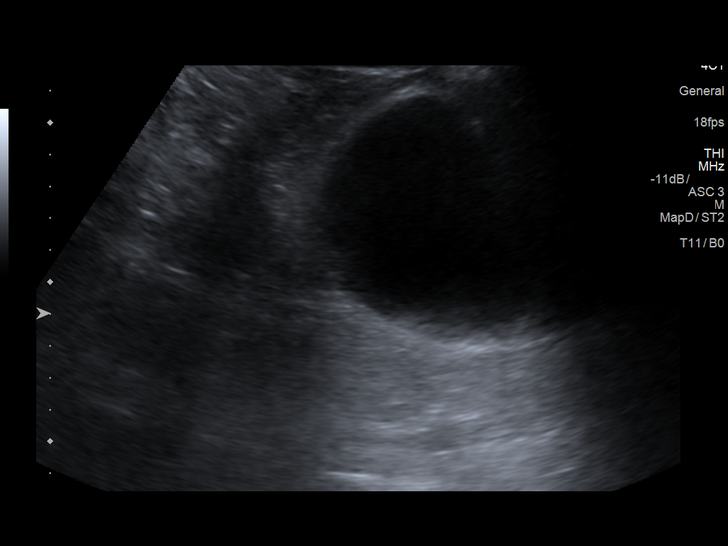
[im 17/27]
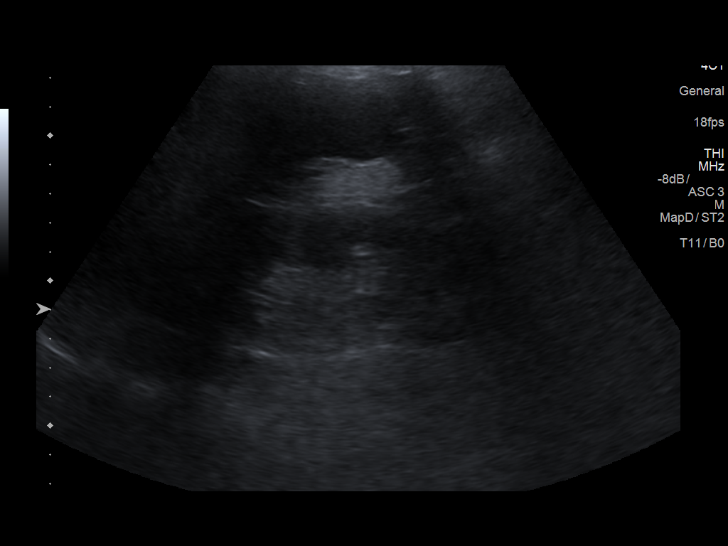
[im 18/27]
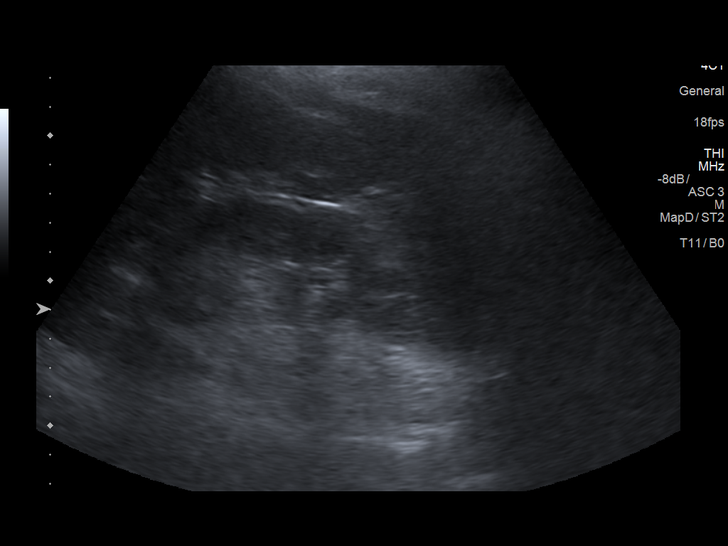
[im 20/27]
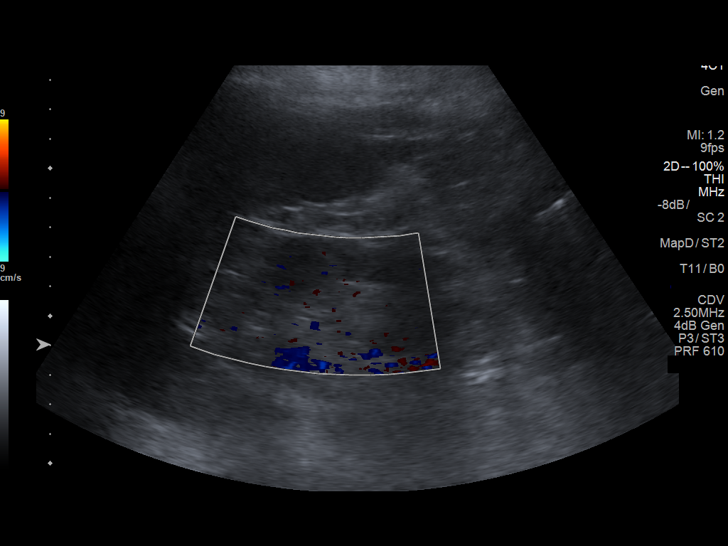
[im 22/27]
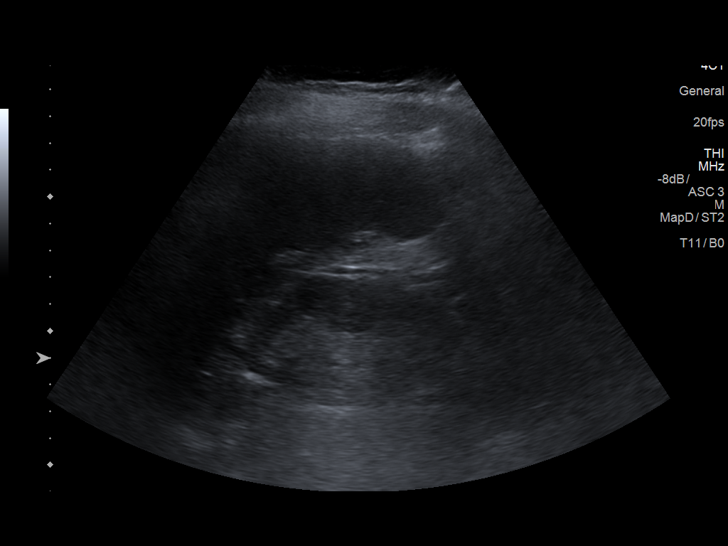
[im 24/27]
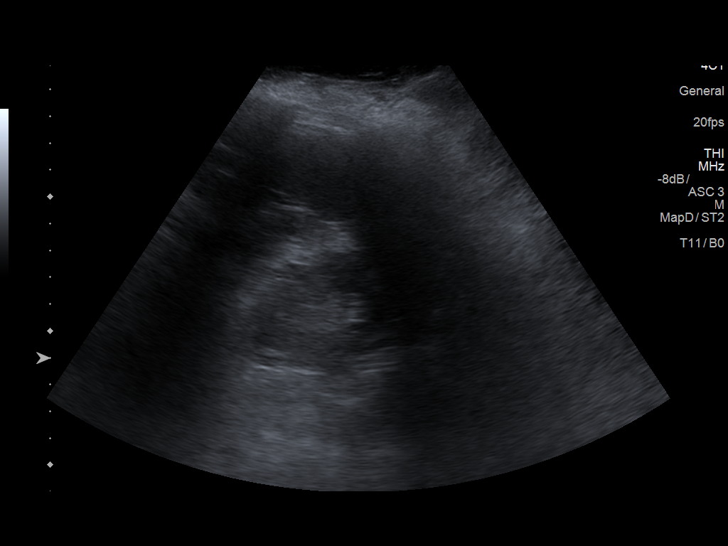
[im 27/27]
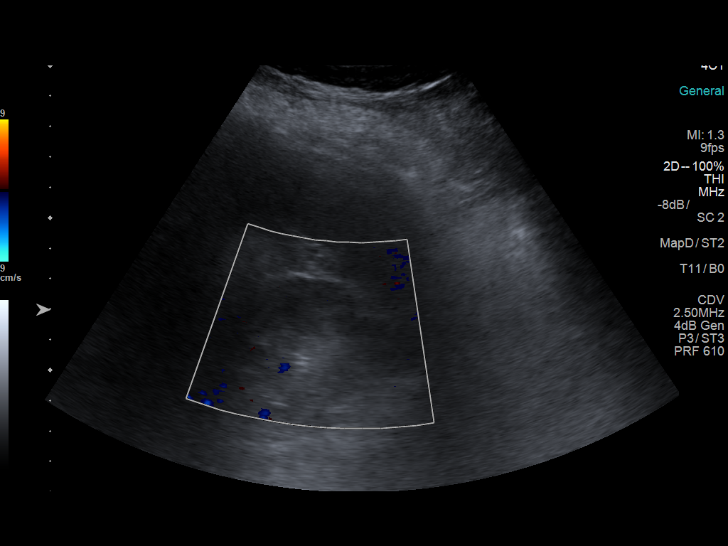

[14 of 25 positions shown; findings below may reference images not displayed]

FINDINGS: Right Kidney:

Length: 10 cm. 12 mm interpolar cyst. Lobulated renal cortex with
thinning. No hydronephrosis. No evidence of solid mass.

Left Kidney:

Length: 10 cm. Lobulated renal cortex with thinning. No
hydronephrosis. No evidence of mass.

Bladder:

Appears normal for degree of bladder distention.
IMPRESSION: 1. No hydronephrosis.
2. Renal atrophy.
